# Patient Record
Sex: Male | Born: 1935
Health system: Southern US, Community
[De-identification: ages and names within clinical notes are randomized; demographics above are authoritative.]

## PROBLEM LIST (undated history)

## (undated) DIAGNOSIS — C61 Malignant neoplasm of prostate: Secondary | ICD-10-CM

## (undated) DIAGNOSIS — J9610 Chronic respiratory failure, unspecified whether with hypoxia or hypercapnia: Secondary | ICD-10-CM

## (undated) DIAGNOSIS — E039 Hypothyroidism, unspecified: Secondary | ICD-10-CM

## (undated) DIAGNOSIS — J189 Pneumonia, unspecified organism: Secondary | ICD-10-CM

## (undated) HISTORY — PX: TONSILLECTOMY: SUR1361

## (undated) HISTORY — DX: Malignant neoplasm of prostate: C61

## (undated) HISTORY — PX: ROTATOR CUFF REPAIR: SHX139

## (undated) HISTORY — PX: ADENOIDECTOMY: SUR15

## (undated) HISTORY — DX: Hypothyroidism, unspecified: E03.9

---

## 1998-09-17 HISTORY — PX: OTHER SURGICAL HISTORY: SHX169

## 1999-09-18 HISTORY — PX: TRANSURETHRAL RESECTION OF PROSTATE: SHX73

## 2010-11-06 ENCOUNTER — Telehealth: Payer: Self-pay | Admitting: Pulmonary Disease

## 2010-11-07 ENCOUNTER — Institutional Professional Consult (permissible substitution) (INDEPENDENT_AMBULATORY_CARE_PROVIDER_SITE_OTHER): Payer: MEDICARE | Admitting: Pulmonary Disease

## 2010-11-07 ENCOUNTER — Encounter: Payer: Self-pay | Admitting: Pulmonary Disease

## 2010-11-07 DIAGNOSIS — J439 Emphysema, unspecified: Secondary | ICD-10-CM | POA: Insufficient documentation

## 2010-11-07 DIAGNOSIS — J438 Other emphysema: Secondary | ICD-10-CM

## 2010-11-07 DIAGNOSIS — J449 Chronic obstructive pulmonary disease, unspecified: Secondary | ICD-10-CM | POA: Insufficient documentation

## 2010-11-07 DIAGNOSIS — Z8546 Personal history of malignant neoplasm of prostate: Secondary | ICD-10-CM | POA: Insufficient documentation

## 2010-11-07 DIAGNOSIS — J309 Allergic rhinitis, unspecified: Secondary | ICD-10-CM | POA: Insufficient documentation

## 2010-11-14 NOTE — Progress Notes (Signed)
Summary: has consult tomorrow &  needs to know if he should resch due to   Phone Note Call from Patient Call back at Home Phone [phone]   Caller: Patient Call For: [facility] Summary of Call: Patient phoned stated that he has a consult with Dr Shelle Iron tomorrow and he is on an antibiotic he still has a cough, and chest congestion, states that he feels good but he doesnt think that he could do any type of breathing test tomorrow and wants to know if he should keep that appt or reschedule. Patient can be reached at [phone] or cell [phone] Initial call taken by: Vedia Coffer,  November 06, 2010 9:50 AM  Follow-up for Phone Call        Spoke with pt and advised to keep appt to get established with Dr. Shelle Iron and if he wants a breathing tst he can order for a future date. Pt states understanding. Carron Curie CMA  November 06, 2010 11:56 AM

## 2010-11-23 NOTE — Assessment & Plan Note (Addendum)
Summary: consult for management of copd   Copy to:  Virgilio Frees Primary Provider/Referring Provider:  Virgilio Frees  CC:  Pulmonary Consult.  History of Present Illness: the pt is a 77y/o male who I have been asked to see for management of emphysema.  He was diagnosed many years ago, and has been managed by a pulmonologist ([employer]) in [place].  He is currently on advair with as needed combivent, and did not see a significant difference with a trial of spiriva.  He does not wear oxygen currently, and has never participated in a pulmonary rehab program.  He had an acute exacerbation 2 weeks ago which required a course of abx/pred, and is finishing up his treatment today.  This is his first exacerbation in 45yrs.  He still has some congestion, but is returning to his usual baseline.  He typically has no sob at rest, and exercises on treadmill one mile each day.  He can bring groceries in from the car without sob, but will get somewhat winded climbing one flight of stairs.  His last xray and pfts were one year ago, and will try to get from Dr. Excell Seltzer.   Preventive Screening-Counseling & Management  Alcohol-Tobacco     Smoking Status: quit  Current Medications (verified): 1)  Advair Diskus 250-50 Mcg/dose  Misc (Fluticasone-Salmeterol) .... One Puff Twice Daily 2)  Combivent 18-103 Mcg/act Aero (Ipratropium-Albuterol) .... Inhale 2 Puffs Every 4 To 6 Hours As Needed 3)  Doxazosin Mesylate 8 Mg Tabs (Doxazosin Mesylate) .... Take 1 Tablet By Mouth Once A Day 4)  Levothroid 50 Mcg Tabs (Levothyroxine Sodium) .... Take 1 Tablet By Mouth Once A Day 5)  Zolpidem Tartrate 10 Mg Tabs (Zolpidem Tartrate) .... Take 1 Tab By Mouth At Bedtime 6)  Cefdinir 300 Mg Caps (Cefdinir) .... Take 1 Tablet By Mouth Two Times A Day 7)  Prednisone 20 Mg Tabs (Prednisone) .... Take 1 Tablet By Mouth Two Times A Day 8)  Cvs Spectravite Senior  Tabs (Multiple Vitamins-Minerals) .... Take 1 Tablet By Mouth Once A Day 9)   Citracal +vit D 400/500 .... Take 1 Tablet By Mouth Once A Day 10)  Ra Potassium Gluconate 99 .... Take 1 Tablet By Mouth Once A Day 11)  Vitamin D3 1000 Unit Tabs (Cholecalciferol) .... Take 1 Tablet By Mouth Once A Day  Allergies (verified): 1)  ! Morphine  Past History:  Past Medical History: Emphysema ALLERGIC RHINITIS (ICD-477.9) PROSTATE CANCER (ICD-185) Hypothyroidism   Past Surgical History: Bil rotator cuff repair early 1990s and 2009 TURP x 2 2001 Seed Implant for Prostate cancer 2000 tonsillectomy and adenoidectomy as a child  Family History: Reviewed history and no changes required. heart disease: mother  cancer: father (prostate)   Social History: Reviewed history and no changes required. Patient states former smoker.  started at age 66.  1 1/2 ppd.  Quit 2010. pt is married and lives with wife. pt is a retired Art gallery manager.  Smoking Status:  quit  Review of Systems       The patient complains of shortness of breath with activity, productive cough, sore throat, and nasal congestion/difficulty breathing through nose.  The patient denies shortness of breath at rest, non-productive cough, coughing up blood, chest pain, irregular heartbeats, acid heartburn, indigestion, loss of appetite, weight change, abdominal pain, difficulty swallowing, tooth/dental problems, headaches, sneezing, itching, ear ache, anxiety, depression, hand/feet swelling, joint stiffness or pain, rash, change in color of mucus, and fever.    Vital Signs:  Patient profile:   77 year old male Height:      66 inches Weight:      135 pounds BMI:     21.87 O2 Sat:      91 % on Room air Temp:     98.5 degrees F oral Pulse rate:   119 / minute BP sitting:   140 / 68  (left arm) Cuff size:   regular  Vitals Entered By: Arman Filter LPN (November 07, 2010 2:05 PM)  O2 Flow:  Room air CC: Pulmonary Consult Comments Medications reviewed with patient Arman Filter LPN  November 07, 2010 2:16 PM      Physical Exam  General:  thin male in nad  Eyes:  PERRLA and EOMI.   Nose:  narrowed bilat, no purulence Mouth:  no lesions or exudates seen Neck:  no jvd,tmg, LN Lungs:  very decreased bs throughout, no wheezing or rhonchi  Heart:  rrr, no mrg distant hs Abdomen:  soft and nontender, bs+ Extremities:  no edema or cyanosis  pulses intact distally Neurologic:  alert and oriented, moves all 4    Impression & Recommendations:  Problem # 1:  EMPHYSEMA (ICD-492.8) the pt has a h/o emphysema, and is just getting over an acute exacerbation.  He has no bronchospasm on exam currently, but does desat with exertion today.  He does not want to wear oxygen with exertion, but I do think he needs ONO to r/o nocturnal desats that may be more prolonged and hence be more of an issue for him.  He should maintain on LABA/ICS, but I asked him to consider changing advair to symbicort given the more rapid onset of the LABA.  I also discussed the importance of conditioning, and that he may benefit from participating in pulmonary rehab at [facility].  He will let me know if he decides to do this.    Medications Added to Medication List This Visit: 1)  Advair Diskus 250-50 Mcg/dose Misc (Fluticasone-salmeterol) .... One puff twice daily 2)  Combivent 18-103 Mcg/act Aero (Ipratropium-albuterol) .... Inhale 2 puffs every 4 to 6 hours as needed 3)  Doxazosin Mesylate 8 Mg Tabs (Doxazosin mesylate) .... Take 1 tablet by mouth once a day 4)  Levothroid 50 Mcg Tabs (Levothyroxine sodium) .... Take 1 tablet by mouth once a day 5)  Zolpidem Tartrate 10 Mg Tabs (Zolpidem tartrate) .... Take 1 tab by mouth at bedtime 6)  Cefdinir 300 Mg Caps (Cefdinir) .... Take 1 tablet by mouth two times a day 7)  Prednisone 20 Mg Tabs (Prednisone) .... Take 1 tablet by mouth two times a day 8)  Cvs Spectravite Senior Tabs (Multiple vitamins-minerals) .... Take 1 tablet by mouth once a day 9)  Citracal +vit D 400/500  .... Take 1  tablet by mouth once a day 10)  Ra Potassium Gluconate 99  .... Take 1 tablet by mouth once a day 11)  Vitamin D3 1000 Unit Tabs (Cholecalciferol) .... Take 1 tablet by mouth once a day  Other Orders: Consultation Level IV (99244) Pulse Oximetry, Ambulatory (19147) DME Referral (DME)  Patient Instructions: 1)  stay on advair for now, but can consider a trial of symbicort if you wish.  Let me know. 2)  will get records from Dr. Bernette Redbird office (xrays and pfts) 3)  consider pulmonary rehab in [place] 4)  will check oxygen level overnight in 2-3 weeks.Marland Kitchenonce you get over this recent flareup. 5)  followup with me in 6mos, or sooner if  having problems.   .Ambulatory Pulse Oximetry  Resting; HR__103___    02 Sat__90___  Lap1 (185 feet)   HR___107   __   02 Sat_91____ Lap2 (185 feet)   HR___114__   02 Sat___89__    Lap3 (185 feet)   HR__120___   02 Sat__87___  __x_Test Completed without Difficulty  pt recovered on room air 91% RA   HR 107 pt did not want 02 .Kandice Hams [facility]  November 07, 2010 3:10 PM    Appended Document: consult for management of copd megan, have we received the pulmonary records on this pt from dr. Bernette Redbird office?  Appended Document: consult for management of copd still haven't received records...therefore refaxed ROI.   Appended Document: consult for management of copd received records and put in [facility] very important look at folder.

## 2010-12-16 ENCOUNTER — Encounter: Payer: Self-pay | Admitting: Pulmonary Disease

## 2010-12-16 NOTE — Progress Notes (Deleted)
  Subjective:    Patient ID: John Black, male    DOB: Nov 25, 1928, 77 y.o.   [name]: [id number]  HPI    Review of Systems     Objective:   Physical Exam        Assessment & Plan:

## 2010-12-16 NOTE — Progress Notes (Signed)
ONO on room air shows low sat of 86%, and only 4.80min less than 88%.  Does not need oxygen.

## 2010-12-19 ENCOUNTER — Telehealth: Payer: Self-pay | Admitting: Pulmonary Disease

## 2010-12-19 NOTE — Telephone Encounter (Signed)
Called, spoke with pt.  He has several questions for Dr. Shelle Iron, 1.  Did KC receive records from [facility]? 2.  Pt will be ok with considering a trail of symbicort if he has not done this with [facility] 3.  Requesting ONO results - Done March 19 4.  Would consider Pulm Rehab depending of above. Aware KC out of office until Thursday and ok with this.  Dr. Shelle Iron, pls advise. Thanks!

## 2010-12-20 NOTE — Telephone Encounter (Signed)
ATC pt to inform him of ONO results (per KC, pt does not need oxygen at night) NA.  WCB

## 2010-12-21 NOTE — Telephone Encounter (Signed)
I would recommend attending pulmonary rehab at [facility] Did get records from [place] and reviewed.  Did not see specifically where he had tried symbicort, so would give this a shot to see if he likes better than advair.

## 2010-12-25 ENCOUNTER — Telehealth: Payer: Self-pay | Admitting: Pulmonary Disease

## 2010-12-25 NOTE — Telephone Encounter (Signed)
ERROR PATIENT WAS ACTUALLY RETURNING A CALL TO TRIAGE.John Black

## 2010-12-25 NOTE — Telephone Encounter (Signed)
Called, spoke with pt.  He was informed [facility] recs attending pulmonary rehab at [facility], aware records were received from [facility] and review.  Advised KC did not see specifically where he had tried symbicort so he recs to try this is see if he likes it better than advair.  Pt verbalized understanding of this instructions but requesting to hold off on rehab for now and try symbicort first.  Also, requesting ONO results.  KC, pls advise on ONO results and on symbicort strength and dosing.  [facility].  Thanks!

## 2010-12-25 NOTE — Telephone Encounter (Signed)
ONO was ok.  See message string below this one. symbicort should be 160/4.5  2 am and pm in the place of advair.

## 2010-12-25 NOTE — Telephone Encounter (Signed)
Patient phoned stated that he was returning a call from last week, he can be reached at [phone]

## 2010-12-26 NOTE — Telephone Encounter (Signed)
Advised pt of ono results and pt verbalized understanding. Pt states that he does want to trial the symbicort but wants a sample to try 1st. I advised pt we did not have any samples today. Pt states he will call back tomorrow to see if we have any tomorrow. Will also forward to [employer] to make him aware pt wants to try the symbicort. Please advise. Thanks  Carver Fila, CMA

## 2010-12-26 NOTE — Telephone Encounter (Signed)
Patient returning call.

## 2010-12-28 ENCOUNTER — Telehealth: Payer: Self-pay | Admitting: Pulmonary Disease

## 2010-12-28 NOTE — Telephone Encounter (Signed)
Spoke w/ pt and advised 1 sample of symbicort was left upfront for pick up

## 2010-12-29 NOTE — Telephone Encounter (Signed)
Mindy, you do not have to send these types of messages back to me, just for me to send back to you.  Thanks.

## 2010-12-29 NOTE — Telephone Encounter (Signed)
Patient phoned stated that he picked up a sample of Symbicort yesterday he wants to know if he can just stop the Advir and start the Symbicort or if he needs to wein himself off of the Advir he can be reached at [phone].Vedia Coffer

## 2010-12-29 NOTE — Telephone Encounter (Signed)
Advised pt per [facility] former not he was to do symbicort in place of the advair. Pt verbalized understanding and states he will give it a try. Pt states he will start symbicort Monday morning  Nothing further was needed. Will forward to Dr. Shelle Iron so he is a ware. Please advise. Thanks  John Black, CMA

## 2011-01-17 ENCOUNTER — Telehealth: Payer: Self-pay | Admitting: Pulmonary Disease

## 2011-01-17 NOTE — Telephone Encounter (Signed)
Spoke w/ pt and he states he finished up his trial of symbicort yesterday. Pt states he could not tell a difference in using the symbicort vs advair. Pt states he went back to taking advair this morning and has 3 days left of that. Pt wants to know what he needs to do. Does he continue the symbicort or stay on advair? Please advise Dr. Shelle Iron. Thanks.   Carver Fila, CMA

## 2011-01-17 NOTE — Telephone Encounter (Signed)
advair and symbicort are both good meds.  He needs to stay on one or the other.  If he has a preference, ok to stay on that one.  Either is ok with me.

## 2011-01-18 MED ORDER — BUDESONIDE-FORMOTEROL FUMARATE 160-4.5 MCG/ACT IN AERO: 2.0000 | INHALATION_SPRAY | Freq: Two times a day (BID) | RESPIRATORY_TRACT | Status: DC

## 2011-01-18 NOTE — Telephone Encounter (Signed)
Spoke w/ pt and he states he is going to continue on the symbicort. He states he needed rx sent to [facility]. I advised pt I would send rx and he verbalized understanding and needed nothing else further

## 2011-01-22 ENCOUNTER — Telehealth: Payer: Self-pay | Admitting: Pulmonary Disease

## 2011-01-22 MED ORDER — IPRATROPIUM-ALBUTEROL 18-103 MCG/ACT IN AERO: INHALATION_SPRAY | RESPIRATORY_TRACT | Status: DC

## 2011-01-22 NOTE — Telephone Encounter (Signed)
Pt states he needed his combivent sent to pharmacy. Pt states he is completely out. Advised pt rx was sent to pharmacy and nothing further was needed

## 2011-05-16 ENCOUNTER — Ambulatory Visit: Payer: MEDICARE | Admitting: Pulmonary Disease

## 2011-05-22 ENCOUNTER — Encounter: Payer: Self-pay | Admitting: Pulmonary Disease

## 2011-05-23 ENCOUNTER — Encounter: Payer: Self-pay | Admitting: Pulmonary Disease

## 2011-05-23 ENCOUNTER — Ambulatory Visit (INDEPENDENT_AMBULATORY_CARE_PROVIDER_SITE_OTHER): Payer: Medicare Other | Admitting: Pulmonary Disease

## 2011-05-23 VITALS — BP 126/60 | HR 92 | Temp 98.1°F | Ht 66.0 in | Wt 131.8 lb

## 2011-05-23 DIAGNOSIS — J438 Other emphysema: Secondary | ICD-10-CM

## 2011-05-23 MED ORDER — IPRATROPIUM-ALBUTEROL 18-103 MCG/ACT IN AERO: INHALATION_SPRAY | RESPIRATORY_TRACT | Status: DC

## 2011-05-23 MED ORDER — BUDESONIDE-FORMOTEROL FUMARATE 160-4.5 MCG/ACT IN AERO: 2.0000 | INHALATION_SPRAY | Freq: Two times a day (BID) | RESPIRATORY_TRACT | Status: DC

## 2011-05-23 NOTE — Assessment & Plan Note (Signed)
The patient overall has been doing well from his emphysema standpoint on his current regimen.  He did develop an episode of acute bronchitis after being around his grandchildren, but responded to a course of antibiotics and prednisone.  He feels that he is just about returned to his normal baseline, but still has some increased mucus production that is not purulent.  I have asked him to try Mucinex to see if this will help.  Otherwise, he is to continue on his current regimen and exercise program.  I have also asked him to get a flu shot this fall once he is over his current illness.

## 2011-05-23 NOTE — Patient Instructions (Signed)
No change in meds Can try mucinex extra strength (plain, not dm) one in am and pm with large glass of water for a week or two to help with mucus clearance. followup with me in 6mos if doing well.

## 2011-05-23 NOTE — Progress Notes (Signed)
  Subjective:    Patient ID: John Black, male    DOB: 10/23/1928, 77 y.o.   [name]: [id number]  HPI The patient comes in today for followup of his known emphysema.  He has done well on his current bronchodilator regimen, but recently had an episode of acute bronchitis.  He was treated with a course of antibiotics and prednisone which he just finished.  He is much improved, and feels he is near his usual baseline.  He still has some non-purulent mucus production.  He is staying very active at this time, and participates in regular exercise.   Review of Systems  Constitutional: Negative for fever and unexpected weight change.  HENT: Positive for congestion, rhinorrhea, postnasal drip and sinus pressure. Negative for ear pain, nosebleeds, sore throat, sneezing, trouble swallowing and dental problem.   Eyes: Negative for redness and itching.  Respiratory: Positive for cough, shortness of breath and wheezing. Negative for chest tightness.   Cardiovascular: Negative for palpitations and leg swelling.  Gastrointestinal: Negative for nausea and vomiting.  Genitourinary: Positive for dysuria.  Musculoskeletal: Negative for joint swelling.  Skin: Negative for rash.  Neurological: Negative for headaches.  Hematological: Bruises/bleeds easily.  Psychiatric/Behavioral: Negative for dysphoric mood. The patient is not nervous/anxious.        Objective:   Physical Exam Thin male in no acute distress Nose without purulence or discharge noted Chest with decreased breath sounds throughout, but no wheezing or rhonchi Cardiac exam with regular rate and rhythm Lower extremities without edema, no cyanosis noted Alert and oriented, moves all 4 extremities.       Assessment & Plan:

## 2011-11-20 ENCOUNTER — Ambulatory Visit (INDEPENDENT_AMBULATORY_CARE_PROVIDER_SITE_OTHER): Payer: Medicare Other | Admitting: Pulmonary Disease

## 2011-11-20 ENCOUNTER — Encounter: Payer: Self-pay | Admitting: Pulmonary Disease

## 2011-11-20 VITALS — BP 132/78 | HR 112 | Temp 98.1°F | Ht 66.0 in | Wt 135.0 lb

## 2011-11-20 DIAGNOSIS — J438 Other emphysema: Secondary | ICD-10-CM

## 2011-11-20 MED ORDER — BUDESONIDE-FORMOTEROL FUMARATE 160-4.5 MCG/ACT IN AERO: 2.0000 | INHALATION_SPRAY | Freq: Two times a day (BID) | RESPIRATORY_TRACT | Status: DC

## 2011-11-20 NOTE — Assessment & Plan Note (Signed)
The pt has been doing ok since the last visit, but has noted a little more doe.  He has tended to be a minimalist wrt medical intervention, but I think we should give him a trial of spiriva to see if it helps.  I have also recommended oxygen with heavier exertional activities, but the pt would like to think about this.  If he is doing well, will see him back in 6mos.

## 2011-11-20 NOTE — Progress Notes (Signed)
  Subjective:    Patient ID: John Black, male    DOB: 1929/03/30, 78 y.o.   [name]: [id number]  HPI The patient comes in today for follow up of his known severe COPD.  He was last seen in September of last year, and he tells me in December of this last year he had what sounds like an episode of acute bronchitis with an acute exacerbation.  He feels like he returned to his usual baseline, however believes that his exertional tolerance is not as good as last year.  He does have known desaturation with activity, but has wanted to stay away from oxygen use.  He currently has no chest congestion or purulent mucus.   Review of Systems  Constitutional: Negative for fever and unexpected weight change.  HENT: Negative for ear pain, nosebleeds, congestion, sore throat, rhinorrhea, sneezing, trouble swallowing, dental problem, postnasal drip and sinus pressure.   Eyes: Negative for redness and itching.  Respiratory: Positive for chest tightness, shortness of breath and wheezing. Negative for cough.   Cardiovascular: Negative for palpitations and leg swelling.  Gastrointestinal: Negative for nausea and vomiting.  Genitourinary: Negative for dysuria.  Musculoskeletal: Negative for joint swelling.  Skin: Negative for rash.  Neurological: Negative for headaches.  Hematological: Does not bruise/bleed easily.  Psychiatric/Behavioral: Negative for dysphoric mood. The patient is not nervous/anxious.        Objective:   Physical Exam Thin male in no acute distress Nose without purulence or discharge noted Chest with decreased breath sounds throughout, no wheezing Cardiac exam with regular rate and rhythm Lower extremities with no edema, no cyanosis Alert and oriented, moves all 4 extremities.       Assessment & Plan:

## 2011-11-20 NOTE — Patient Instructions (Signed)
Continue symbicort as you are doing Start spiriva one inhalation each am as a trial for the next 4 weeks.  Let me know what you think Hold combivent, and replace with albuterol 2 puffs every 6hrs if needed for rescue only. Try and stay as active as possible Consider using oxygen with consistent exertional activities.  Let me know followup with me in 6mos

## 2011-12-21 ENCOUNTER — Telehealth: Payer: Self-pay | Admitting: Pulmonary Disease

## 2011-12-21 ENCOUNTER — Encounter: Payer: Self-pay | Admitting: Pulmonary Disease

## 2011-12-21 NOTE — Telephone Encounter (Signed)
If the spiriva didn't improve things, then stop and stay on symbicort. Can also change rescue from albuterol to combivent prn.

## 2011-12-21 NOTE — Telephone Encounter (Signed)
I spoke with pt and he was calling to give update on spiriva. He states he can;t tell any difference in his breathing and he has 1 capsule left to take and he has been on it for 1 month. He also states the combivent inhaler helped more with his breathing than the proair did. Please advise KC thanks

## 2011-12-21 NOTE — Telephone Encounter (Signed)
I spoke with pt and is aware of KC recs. Pt states he will call back if he needs refills. Nothing further was needed

## 2012-02-18 ENCOUNTER — Telehealth: Payer: Self-pay | Admitting: Pulmonary Disease

## 2012-02-18 ENCOUNTER — Other Ambulatory Visit: Payer: Self-pay | Admitting: Pulmonary Disease

## 2012-02-18 NOTE — Telephone Encounter (Signed)
Last seen by [facility] 11/2011, follow up in 6 months.  Per last ov note, pt was sent refills on his symbicort to the requested pharmacy.  [facility] > had been filling pt's old rx for the symbicort but will now fill off of the script sent in March.    Called spoke with patient, advised him of the following.  Pt okay with this and verbalized his understanding.  Encouraged pt to call if anything further is needed.  Will sign off.

## 2012-05-29 ENCOUNTER — Ambulatory Visit: Payer: Medicare Other | Admitting: Pulmonary Disease

## 2012-05-29 ENCOUNTER — Encounter: Payer: Self-pay | Admitting: Pulmonary Disease

## 2012-05-29 ENCOUNTER — Ambulatory Visit (INDEPENDENT_AMBULATORY_CARE_PROVIDER_SITE_OTHER): Payer: Medicare Other | Admitting: Pulmonary Disease

## 2012-05-29 VITALS — BP 118/70 | HR 99 | Temp 98.2°F | Ht 66.0 in | Wt 136.2 lb

## 2012-05-29 DIAGNOSIS — J438 Other emphysema: Secondary | ICD-10-CM

## 2012-05-29 MED ORDER — IPRATROPIUM-ALBUTEROL 18-103 MCG/ACT IN AERO: INHALATION_SPRAY | RESPIRATORY_TRACT | Status: DC

## 2012-05-29 MED ORDER — PREDNISONE 10 MG PO TABS: ORAL_TABLET | ORAL | Status: DC

## 2012-05-29 MED ORDER — BUDESONIDE-FORMOTEROL FUMARATE 160-4.5 MCG/ACT IN AERO: 2.0000 | INHALATION_SPRAY | Freq: Two times a day (BID) | RESPIRATORY_TRACT | Status: DC
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
## 2012-05-29 NOTE — Progress Notes (Signed)
  Subjective:    Patient ID: John Black, male    DOB: 30-Jul-1929, 79 y.o.   [name]: [id number]  HPI Patient comes in today for followup of his known COPD.  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  He has noticed a little bit more increased shortness of breath, but no wheezing or purulent mucus.  He did not see a difference with Spiriva in the past, and therefore we discontinued this.  He has been having exertional desaturations for some time, but has not been willing to wear oxygen.  He is now willing to consider this.   Review of Systems  Constitutional: Negative for fever and unexpected weight change.  HENT: Positive for congestion, rhinorrhea, sneezing and postnasal drip. Negative for ear pain, nosebleeds, sore throat, trouble swallowing, dental problem and sinus pressure.   Eyes: Positive for itching. Negative for redness.  Respiratory: Positive for shortness of breath and wheezing. Negative for cough and chest tightness.   Cardiovascular: Negative for palpitations and leg swelling.  Gastrointestinal: Negative for nausea and vomiting.  Genitourinary: Negative for dysuria.  Musculoskeletal: Negative for joint swelling.  Skin: Negative for rash.  Neurological: Negative for headaches.  Hematological: Does not bruise/bleed easily.  Psychiatric/Behavioral: Negative for dysphoric mood. The patient is not nervous/anxious.        Objective:   Physical Exam Thin male in no acute distress Nose without purulence or discharge noted Chest with decreased breath sounds throughout, no active wheezing Cardiac exam with regular rate and rhythm Lower extremities without edema, no cyanosis Alert and oriented, moves all 4 extremities.       Assessment & Plan:
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
## 2012-06-19 ENCOUNTER — Encounter: Payer: Self-pay | Admitting: Family Medicine

## 2012-06-19 ENCOUNTER — Ambulatory Visit (INDEPENDENT_AMBULATORY_CARE_PROVIDER_SITE_OTHER): Payer: Medicare Other | Admitting: Family Medicine

## 2012-06-19 VITALS — BP 130/70 | HR 100 | Resp 20 | Ht 65.5 in | Wt 136.1 lb

## 2012-06-19 DIAGNOSIS — D229 Melanocytic nevi, unspecified: Secondary | ICD-10-CM | POA: Insufficient documentation

## 2012-06-19 DIAGNOSIS — J438 Other emphysema: Secondary | ICD-10-CM

## 2012-06-19 DIAGNOSIS — R42 Dizziness and giddiness: Secondary | ICD-10-CM

## 2012-06-19 DIAGNOSIS — C61 Malignant neoplasm of prostate: Secondary | ICD-10-CM

## 2012-06-19 DIAGNOSIS — E039 Hypothyroidism, unspecified: Secondary | ICD-10-CM

## 2012-06-19 DIAGNOSIS — L821 Other seborrheic keratosis: Secondary | ICD-10-CM

## 2012-06-19 DIAGNOSIS — D239 Other benign neoplasm of skin, unspecified: Secondary | ICD-10-CM

## 2012-06-19 MED ORDER — ZOLPIDEM TARTRATE 10 MG PO TABS: 10.0000 mg | ORAL_TABLET | Freq: Every evening | ORAL | Status: DC | PRN

## 2012-06-19 MED ORDER — LEVOTHYROXINE SODIUM 50 MCG PO TABS: 50.0000 ug | ORAL_TABLET | Freq: Every day | ORAL | Status: DC

## 2012-06-19 MED ORDER — DOXAZOSIN MESYLATE 8 MG PO TABS: 8.0000 mg | ORAL_TABLET | Freq: Every day | ORAL | Status: DC

## 2012-06-19 MED ORDER — IPRATROPIUM-ALBUTEROL 18-103 MCG/ACT IN AERO: INHALATION_SPRAY | RESPIRATORY_TRACT | Status: DC

## 2012-06-19 NOTE — Progress Notes (Signed)
  Subjective:    Patient ID: John Black, male    DOB: March 24, 1929, 78 y.o.   [name]: [id number]  HPI Patient here to establish care. Previous PCP tried adult pediatrics. Pulmonary- Dr. Marcelyn Bruins Medications and history reviewed This a.m. patient expands a mild dizzy spell where he fell off balance after waking up. He typically has low blood pressure has never been treated for hypertension. He's never had a stroke or myocardial infarction. There was no change in his speech no weakness or other stroke symptoms. The spell resolved by itself. He has not had any previous episode Prostate cancer diagnosed in 2000 and he is status post radiation as well as implants eating. He is currently maintained on Cardura secondary to BPH   COPD-he is currently followed by pulmonary clinic with Dr. claims. He was on Advair in the past however Symbicort is cheaper for him so he is currently on this as well as Combivent. He tells me he was on Spiriva and albuterol separately however these did not work for him. He has 2 L of oxygen at home however rarely uses this. He has had a sleep study in the past. He is asking for prescription for Combivent so that he can try a mail order as this is costly for him.  Rash- He's noticed a spot on his scalp over the past few years which flakes off and then continues to grow. He gets caught in his tone with daily hair combing and washing of gout. He also has other spots ondate concerns him   Review of Systems  GEN- denies fatigue, fever, weight loss,weakness, recent illness HEENT- denies eye drainage, change in vision, nasal discharge, CVS- denies chest pain, palpitations RESP- denies SOB, cough, wheeze ABD- denies N/V, change in stools, abd pain GU- denies dysuria, hematuria, dribbling, incontinence MSK- denies joint pain, muscle aches, injury Neuro- denies headache, dizziness, syncope, seizure activity      Objective:   Physical Exam GEN- NAD, alert and oriented  x3 HEENT- PERRL, EOMI, non injected sclera, pink conjunctiva, MMM, oropharynx clear, upper dentures Neck- Supple, no appreciable bruit but difficult to hear CVS- RRR, no murmur RESP-CTAB, decreased at bases, no wheeze ABD-NABS,soft,NT,ND EXT- No edema Pulses- Radial 2+ Skin- fair skin, crusty lesion on top of scalp, no hair in the area, scaly pearly lesion on forehead, multiple keratosis on back, face, neck  Psych-normal affect and Mood  Neuro-CNII-XII in tact, no focal deficits       Assessment & Plan:

## 2012-06-19 NOTE — Assessment & Plan Note (Signed)
Review cardiology note. Given prescription for Combivent I did discuss with him that there is now Combivent breast feels that his inhaler made looked different from his previous prescription

## 2012-06-19 NOTE — Assessment & Plan Note (Addendum)
Acute episode of dizziness this morning. No CVA symptoms. His exam is within limits today he is not orthostatic. If he has recurrent symptoms will start workup he may need carotid Dopplers , plus minus MRI and referral to neurology, with his cancer history

## 2012-06-19 NOTE — Assessment & Plan Note (Signed)
Currently in remission. Continue Cardura for BPH symptoms

## 2012-06-19 NOTE — Assessment & Plan Note (Signed)
Concern for basal cell carcinoma on his forehead and his scalp will refer to dermatology

## 2012-06-19 NOTE — Assessment & Plan Note (Signed)
Obtain most recent labs continue Synthroid

## 2012-06-19 NOTE — Patient Instructions (Signed)
Continue current medications I will get records Call if you have another spell  Shop around for the combivent  Medications refilled  Dermatology referral F/U 4 month

## 2012-07-07 ENCOUNTER — Encounter: Payer: Self-pay | Admitting: Family Medicine

## 2012-07-07 ENCOUNTER — Ambulatory Visit (INDEPENDENT_AMBULATORY_CARE_PROVIDER_SITE_OTHER): Payer: Medicare Other | Admitting: Family Medicine

## 2012-07-07 VITALS — BP 130/68 | HR 116 | Resp 22 | Ht 65.5 in | Wt 137.0 lb

## 2012-07-07 DIAGNOSIS — J441 Chronic obstructive pulmonary disease with (acute) exacerbation: Secondary | ICD-10-CM

## 2012-07-07 MED ORDER — LEVOFLOXACIN 500 MG PO TABS: 500.0000 mg | ORAL_TABLET | Freq: Every day | ORAL | Status: AC

## 2012-07-07 MED ORDER — PREDNISONE 10 MG PO TABS: ORAL_TABLET | ORAL | Status: DC

## 2012-07-07 NOTE — Assessment & Plan Note (Signed)
IM solumedrol, antibiotics, prednisone taper, advised to use oxygen and rescue inhaler

## 2012-07-07 NOTE — Patient Instructions (Addendum)
Use oxygen while currently ill Use albuterol as rescue inhaler  Take antibiotics as prescribed Start prednisone tomorrow  F/U on Thursday if not improving

## 2012-07-07 NOTE — Progress Notes (Signed)
  Subjective:    Patient ID: John Black, male    DOB: May 03, 1929, 78 y.o.   [name]: [id number]  HPI Patient here with cough nasal congestion shortness of breath for the past 4 days. His wife was sick last week. He's been using his Symbicort however has not used albuterol very much which is his rescue inhaler. He is short of breath with very short distances and his wife has noticed increased wheezing at bedtime. He has severe COPD/emphysema. He does have oxygen at home but uses it very rarely. His wife gave him 10 mg of prednisone this morning and one doxycycline tablet   Review of Systems   GEN- denies fatigue, fever, weight loss,weakness, recent illness HEENT- denies eye drainage, change in vision, nasal discharge, CVS- denies chest pain, palpitations RESP- denies SOB, cough, wheeze ABD- denies N/V, change in stools, abd pain GU- denies dysuria, hematuria, dribbling, incontinence MSK- denies joint pain, muscle aches, injury Neuro- denies headache, dizziness, syncope, seizure activity      Objective:   Physical Exam  GEN- NAD, alert and oriented x3 HEENT- PERRL, EOMI, non injected sclera, pink conjunctiva, MMM, oropharynx clear,  Neck- Supple, no LAD CVS- RRR, no murmur RESP-+bronchospasm and wheeze, increased WOB with exertion  EXT- No edema Pulses- Radial 2+  S/p neb improved WOB, given solumedrol 125mg  injection     Assessment & Plan:

## 2012-07-08 MED ORDER — IPRATROPIUM BROMIDE 0.02 % IN SOLN
0.5000 mg | Freq: Once | RESPIRATORY_TRACT | Status: AC
  Administered 2012-07-08: 0.5 mg via RESPIRATORY_TRACT

## 2012-07-08 MED ORDER — SODIUM CHLORIDE 0.9 % IV SOLN
125.0000 mg | Freq: Once | INTRAVENOUS | Status: AC
  Administered 2012-07-08: 130 mg via INTRAMUSCULAR

## 2012-07-08 MED ORDER — ALBUTEROL SULFATE (5 MG/ML) 0.5% IN NEBU
2.5000 mg | INHALATION_SOLUTION | Freq: Once | RESPIRATORY_TRACT | Status: AC
  Administered 2012-07-08: 2.5 mg via RESPIRATORY_TRACT

## 2012-07-08 NOTE — Addendum Note (Signed)
Addended by: Kandis Fantasia B on: 07/08/2012 08:31 AM   Modules accepted: Orders

## 2012-08-08 ENCOUNTER — Other Ambulatory Visit: Payer: Self-pay | Admitting: Family Medicine

## 2012-09-04 ENCOUNTER — Ambulatory Visit (INDEPENDENT_AMBULATORY_CARE_PROVIDER_SITE_OTHER): Payer: Medicare Other | Admitting: Family Medicine

## 2012-09-04 ENCOUNTER — Encounter: Payer: Self-pay | Admitting: Family Medicine

## 2012-09-04 VITALS — BP 132/66 | HR 74 | Temp 99.0°F | Resp 22 | Ht 65.5 in | Wt 135.1 lb

## 2012-09-04 DIAGNOSIS — J438 Other emphysema: Secondary | ICD-10-CM

## 2012-09-04 DIAGNOSIS — J189 Pneumonia, unspecified organism: Secondary | ICD-10-CM

## 2012-09-04 MED ORDER — PREDNISONE 20 MG PO TABS: 20.0000 mg | ORAL_TABLET | Freq: Two times a day (BID) | ORAL | Status: DC

## 2012-09-04 MED ORDER — LEVOFLOXACIN 750 MG PO TABS: 750.0000 mg | ORAL_TABLET | Freq: Every day | ORAL | Status: AC

## 2012-09-04 NOTE — Patient Instructions (Addendum)
I am treating for pneumonia - Start levaquin antibiotic once a day for a week Start the prednisone if you get worse or starting wheezing  Mucinex twice a day  Oxygen at all times for now  Go to ER if you get worse

## 2012-09-05 DIAGNOSIS — J189 Pneumonia, unspecified organism: Secondary | ICD-10-CM | POA: Insufficient documentation

## 2012-09-05 NOTE — Progress Notes (Signed)
  Subjective:    Patient ID: Elise Gladden, male    DOB: Mar 22, 1929, 78 y.o.   [name]: [id number]  HPI  Patient presents with progressive shortness of breath cough and fever x 3 days.  He noticed that he was also running fever and feeling a little achy. He did have a flu shot. He has not been using his oxygen. He uses his Combivent a few times a day. He's not been wheezing. +productive cough Fever 100.4 F   Review of Systems  GEN- + fatigue, fever, weight loss,weakness, recent illness HEENT- denies eye drainage, change in vision, nasal discharge, CVS- denies chest pain, palpitations RESP- + SOB, +cough, wheeze ABD- denies N/V, change in stools, abd pain GU- denies dysuria, hematuria, dribbling, incontinence MSK- denies joint pain, +muscle aches, injury Neuro- denies headache, dizziness, syncope, seizure activity      Objective:   Physical Exam GEN- NAD, alert and oriented x3 , given oxygen in Exam room improved to 90% HEENT- PERRL, EOMI, non injected sclera, pink conjunctiva, MMM, oropharynx mild injection, TM clear bilat no effusion, no maxillary sinus tenderness, nares clear  Neck- Supple, no LAD, no retractions  CVS- RRR, no murmur RESP-+ rales, Right base, no wheeze no bronchospasm, oxygen 84% RA , increased WOB with exertion only  EXT- No edema Pulses- Radial 2+          Assessment & Plan:

## 2012-09-05 NOTE — Assessment & Plan Note (Signed)
I'm concerned about acquired pneumonia. Will start him on Levaquin for one week. He'll also start Mucinex. With his fever and hypoxia and at this time no evidence of bronchospasm or wheeze. Since his oxygen levels are in the mid 80s without oxygen on a when I have him to get a chest x-ray today he lives less than 5 miles from here will go directly home to get his home oxygen instead.  Wife is to call for any changes, he feels comfortable going home

## 2012-09-05 NOTE — Assessment & Plan Note (Signed)
He does not appear to be in bronchospsam with acute exacerbation, therefore will hold on starting the steroids, if he gets worse of wheeze sets in wife aware to start prednisone, He is to use oxygen at all times during the time as hypoxic

## 2012-09-18 ENCOUNTER — Other Ambulatory Visit: Payer: Self-pay | Admitting: *Deleted

## 2012-09-18 MED ORDER — BUDESONIDE-FORMOTEROL FUMARATE 160-4.5 MCG/ACT IN AERO: 2.0000 | INHALATION_SPRAY | Freq: Two times a day (BID) | RESPIRATORY_TRACT | Status: DC

## 2012-09-25 ENCOUNTER — Other Ambulatory Visit: Payer: Self-pay | Admitting: Pulmonary Disease

## 2012-09-25 MED ORDER — BUDESONIDE-FORMOTEROL FUMARATE 160-4.5 MCG/ACT IN AERO: 2.0000 | INHALATION_SPRAY | Freq: Two times a day (BID) | RESPIRATORY_TRACT | Status: DC

## 2012-10-02 ENCOUNTER — Ambulatory Visit (HOSPITAL_COMMUNITY)
Admission: RE | Admit: 2012-10-02 | Discharge: 2012-10-02 | Disposition: A | Discharge status: Home / Self Care | Payer: Medicare Other | Source: Ambulatory Visit | Attending: Family Medicine | Admitting: Family Medicine

## 2012-10-02 ENCOUNTER — Telehealth: Payer: Self-pay | Admitting: Family Medicine

## 2012-10-02 DIAGNOSIS — R0602 Shortness of breath: Secondary | ICD-10-CM | POA: Insufficient documentation

## 2012-10-02 DIAGNOSIS — R05 Cough: Secondary | ICD-10-CM

## 2012-10-02 DIAGNOSIS — R059 Cough, unspecified: Secondary | ICD-10-CM

## 2012-10-02 DIAGNOSIS — J449 Chronic obstructive pulmonary disease, unspecified: Secondary | ICD-10-CM | POA: Insufficient documentation

## 2012-10-02 DIAGNOSIS — J4489 Other specified chronic obstructive pulmonary disease: Secondary | ICD-10-CM | POA: Insufficient documentation

## 2012-10-02 IMAGING — CR DG CHEST 2V
2 series · 2 of 2 positions shown · non-contrast
Comparison: None

CLINICAL DATA: Shortness of breath for 3 days, COPD

CHEST - 2 VIEW

[view not recorded (1 of 2)]
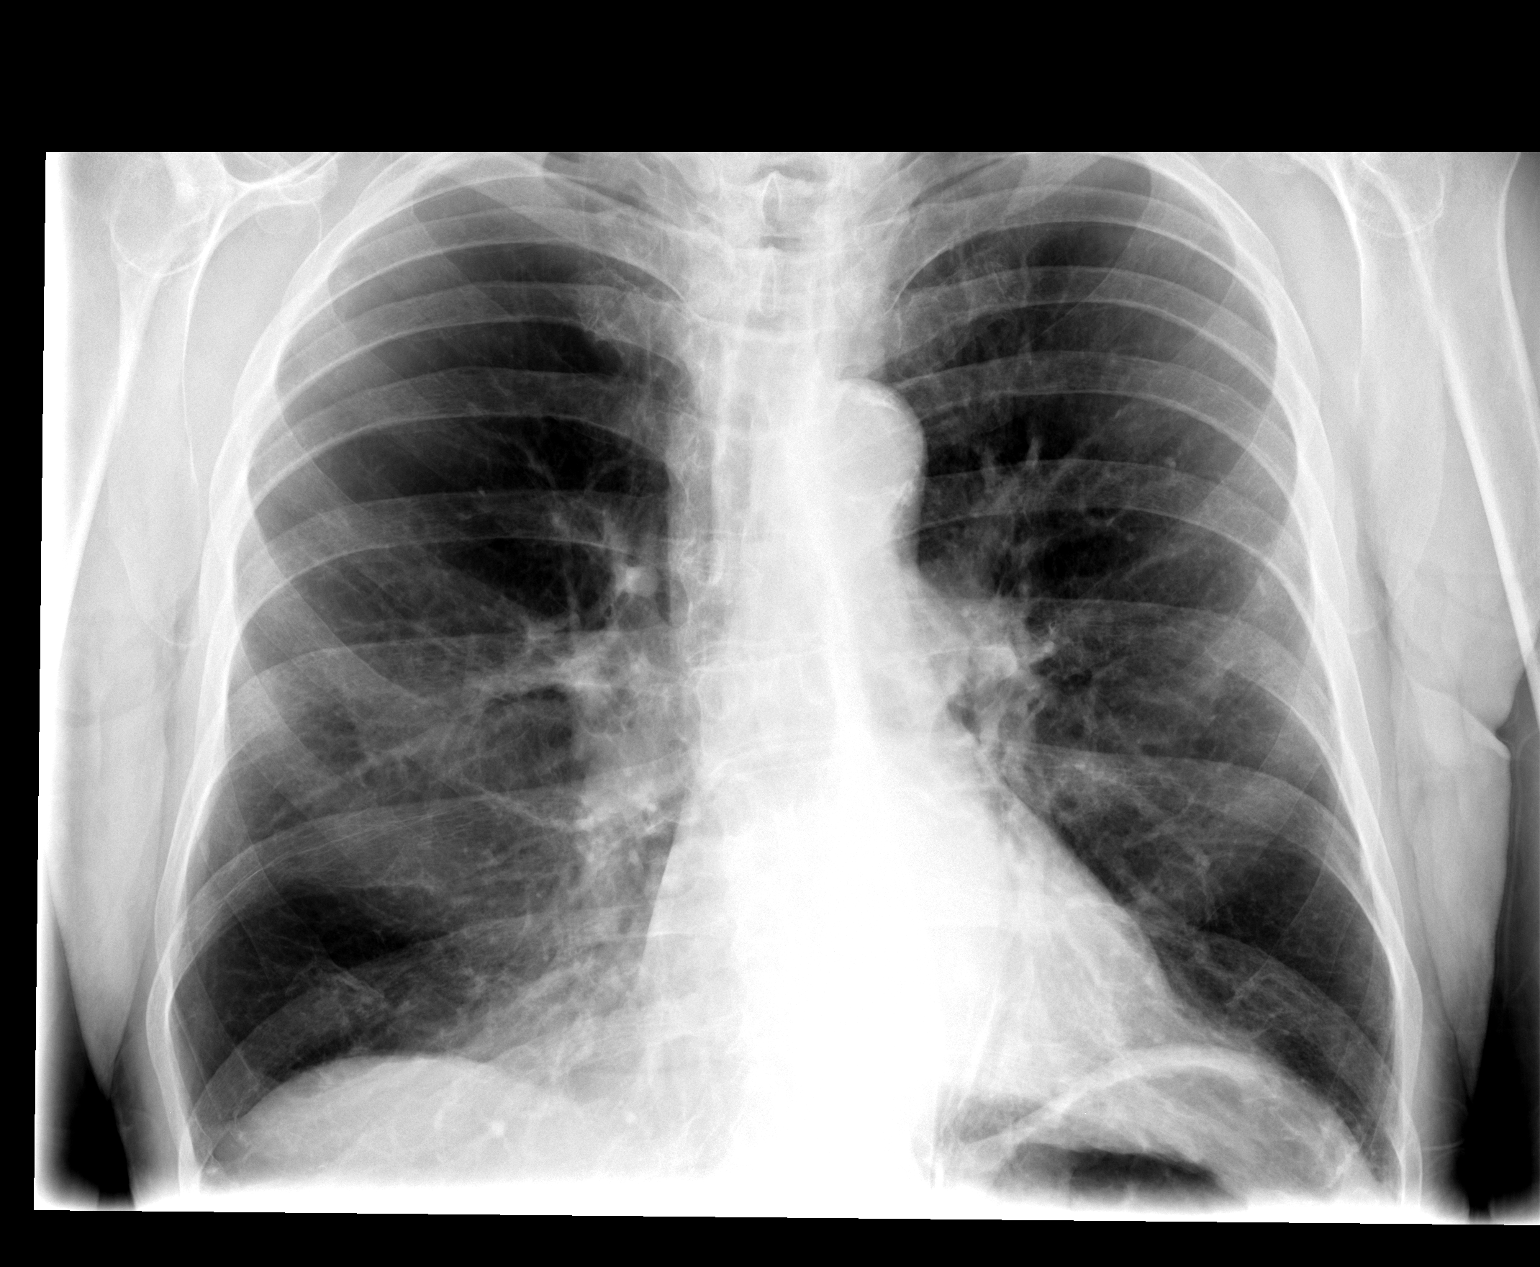

[view not recorded (2 of 2)]
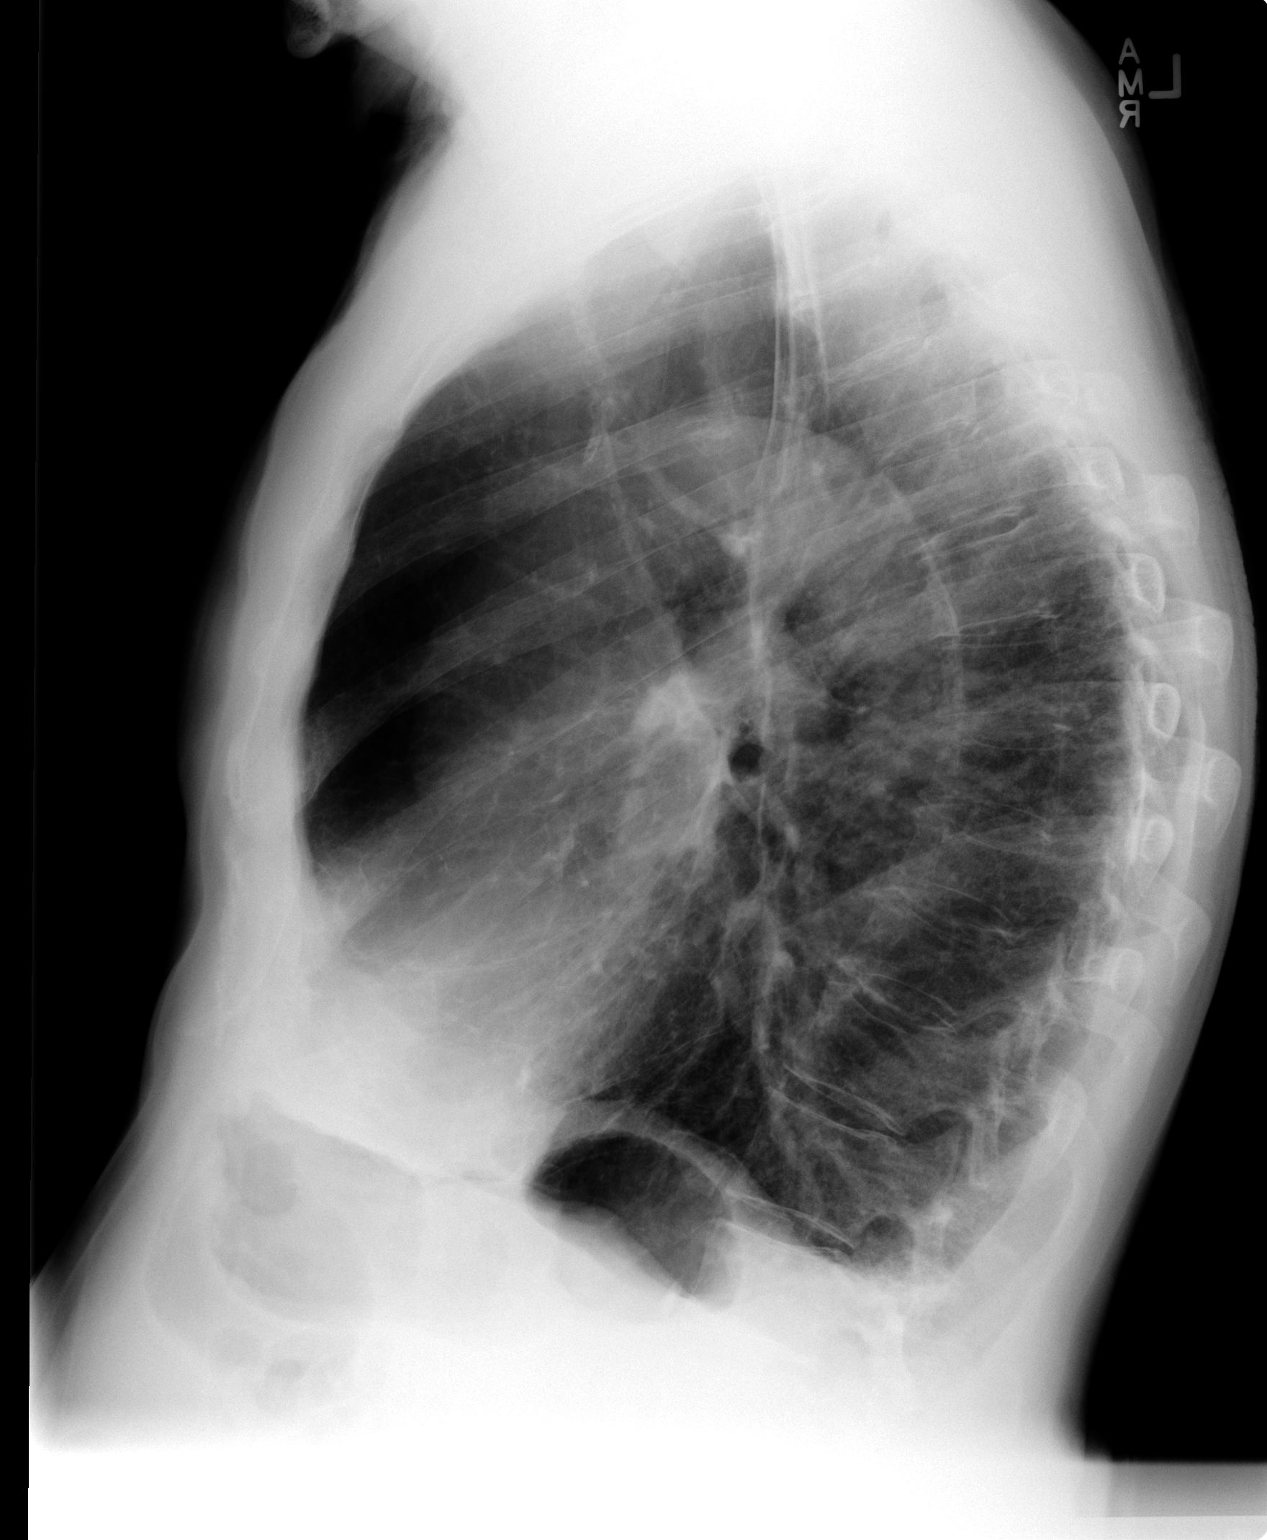

[2 of 2 positions shown; findings below may reference images not displayed]

FINDINGS: Normal heart size and pulmonary vascularity.
Calcified tortuous aorta.
Emphysematous changes consistent with COPD.
Minimal peribronchial thickening.
No acute infiltrate, pleural effusion or pneumothorax.
Bones appear demineralized.
IMPRESSION: COPD.
No acute abnormalities.

## 2012-10-02 MED ORDER — PREDNISONE 20 MG PO TABS: 20.0000 mg | ORAL_TABLET | Freq: Two times a day (BID) | ORAL | Status: DC

## 2012-10-02 MED ORDER — LEVOFLOXACIN 750 MG PO TABS: 750.0000 mg | ORAL_TABLET | Freq: Every day | ORAL | Status: AC

## 2012-10-02 NOTE — Telephone Encounter (Signed)
Please have pt go get Chest xray, make sure he is wearing oxygen

## 2012-10-02 NOTE — Telephone Encounter (Signed)
Patient aware.

## 2012-10-02 NOTE — Telephone Encounter (Signed)
Patient aware and will go and have xray.

## 2012-10-02 NOTE — Telephone Encounter (Signed)
His chest xray is normal He can go ahead and start antibiotics and prednisone, wear the oxygen Come in tomorrow to be checked

## 2012-10-02 NOTE — Telephone Encounter (Signed)
Patient has congestion and ear pain.

## 2012-10-03 ENCOUNTER — Encounter: Payer: Self-pay | Admitting: Family Medicine

## 2012-10-03 ENCOUNTER — Ambulatory Visit (INDEPENDENT_AMBULATORY_CARE_PROVIDER_SITE_OTHER): Payer: Medicare Other | Admitting: Family Medicine

## 2012-10-03 VITALS — BP 124/66 | HR 100 | Resp 20 | Ht 65.5 in | Wt 136.0 lb

## 2012-10-03 DIAGNOSIS — C61 Malignant neoplasm of prostate: Secondary | ICD-10-CM

## 2012-10-03 DIAGNOSIS — J438 Other emphysema: Secondary | ICD-10-CM

## 2012-10-03 DIAGNOSIS — J329 Chronic sinusitis, unspecified: Secondary | ICD-10-CM

## 2012-10-03 DIAGNOSIS — E039 Hypothyroidism, unspecified: Secondary | ICD-10-CM

## 2012-10-03 DIAGNOSIS — E785 Hyperlipidemia, unspecified: Secondary | ICD-10-CM

## 2012-10-03 NOTE — Patient Instructions (Signed)
Hold the prednisone Take the levaquin Continue all other meds Get fasting labs before next visit Keep Feb appt

## 2012-10-03 NOTE — Progress Notes (Signed)
  Subjective:    Patient ID: John Black, male    DOB: [birth year], 79 y.o.   [name]: [id number]  HPI   Patient presents with sinus drainage minimal cough sore throat for the past week. He was actually treated for community acquired pneumonia 4 weeks ago. 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 He has used some Neo-Synephrine nasal drops which have helped. He's been using his oxygen more than normal at home however did not bring with him today. His wheezing is at baseline LS he really exerts himself. He is using the Combivent in the Symbicort   Review of Systems   GEN- + fatigue, fever, weight loss,weakness, recent illness HEENT- denies eye drainage, change in vision,+ nasal discharge, CVS- denies chest pain, palpitations RESP- denies SOB, +cough, +wheeze ABD- denies N/V, change in stools, abd pain Neuro- denies headache, dizziness, syncope, seizure activity      Objective:   Physical Exam  GEN- NAD, alert and oriented x3 HEENT- PERRL, EOMI, non injected sclera, pink conjunctiva, MMM, oropharynx mild injection, TM clear bilat no effusion, + maxillary sinus tenderness, inflammed turbinates,  +Nasal drainage  Neck- Supple, no LAD CVS- RRR, no murmur RESP-CTAB, harsh cough  EXT- No edema Pulses- Radial 2+        Assessment & Plan:

## 2012-10-03 NOTE — Assessment & Plan Note (Signed)
With exception of his hypoxia because he does not wear his oxygen on a regular basis even though every time he comes in he is in the mid 80s his COPD appears to be at baseline with use of Combivent and Symbicort. There is no bronchospasm noted today and his chest x-ray was negative for infiltrate

## 2012-10-03 NOTE — Assessment & Plan Note (Signed)
Unfortunately he has another illness within 4 weeks of the previous I will place him on Levaquin for 10 days to cover for sinusitis as often per history this results and COPD exacerbation. He does have prednisone at home I will have him hold this unless he develops more pulmonary symptoms.

## 2012-10-18 LAB — CBC
HCT: 44.9 % (ref 39.0–52.0)
Hemoglobin: 15.1 g/dL (ref 13.0–17.0)
MCH: 28.1 pg (ref 26.0–34.0)
MCHC: 33.6 g/dL (ref 30.0–36.0)
MCV: 83.5 fL (ref 78.0–100.0)
Platelets: 364 10*3/uL (ref 150–400)
RBC: 5.38 MIL/uL (ref 4.22–5.81)
RDW: 14.1 % (ref 11.5–15.5)
WBC: 8.6 10*3/uL (ref 4.0–10.5)

## 2012-10-18 LAB — LIPID PANEL
Cholesterol: 238 mg/dL — ABNORMAL HIGH (ref 0–200)
HDL: 73 mg/dL (ref >39)
LDL Cholesterol: 131 mg/dL — ABNORMAL HIGH (ref 0–99)
Total CHOL/HDL Ratio: 3.3 Ratio
Triglycerides: 171 mg/dL — ABNORMAL HIGH (ref <150)
VLDL: 34 mg/dL (ref 0–40)

## 2012-10-18 LAB — COMPREHENSIVE METABOLIC PANEL
ALT: 19 U/L (ref 0–53)
AST: 19 U/L (ref 0–37)
Albumin: 4.2 g/dL (ref 3.5–5.2)
Alkaline Phosphatase: 54 U/L (ref 39–117)
BUN: 19 mg/dL (ref 6–23)
CO2: 28 mEq/L (ref 19–32)
Calcium: 9.8 mg/dL (ref 8.4–10.5)
Chloride: 101 mEq/L (ref 96–112)
Creat: 0.88 mg/dL (ref 0.50–1.35)
Glucose, Bld: 81 mg/dL (ref 70–99)
Potassium: 4.7 mEq/L (ref 3.5–5.3)
Sodium: 139 mEq/L (ref 135–145)
Total Bilirubin: 0.8 mg/dL (ref 0.3–1.2)
Total Protein: 6.3 g/dL (ref 6.0–8.3)

## 2012-10-18 LAB — TSH: TSH: 5.235 u[IU]/mL — ABNORMAL HIGH (ref 0.350–4.500)

## 2012-10-18 LAB — T4: T4, Total: 8.9 ug/dL (ref 5.0–12.5)

## 2012-10-18 LAB — T3, FREE: T3, Free: 3.1 pg/mL (ref 2.3–4.2)

## 2012-10-19 LAB — PSA, MEDICARE: PSA: 0.59 ng/mL (ref <=4.00)

## 2012-10-21 ENCOUNTER — Ambulatory Visit (INDEPENDENT_AMBULATORY_CARE_PROVIDER_SITE_OTHER): Payer: Medicare Other | Admitting: Family Medicine

## 2012-10-21 ENCOUNTER — Encounter: Payer: Self-pay | Admitting: Family Medicine

## 2012-10-21 VITALS — BP 118/64 | HR 96 | Resp 20 | Wt 135.0 lb

## 2012-10-21 DIAGNOSIS — G4700 Insomnia, unspecified: Secondary | ICD-10-CM

## 2012-10-21 DIAGNOSIS — E039 Hypothyroidism, unspecified: Secondary | ICD-10-CM

## 2012-10-21 DIAGNOSIS — C61 Malignant neoplasm of prostate: Secondary | ICD-10-CM

## 2012-10-21 DIAGNOSIS — J438 Other emphysema: Secondary | ICD-10-CM

## 2012-10-21 DIAGNOSIS — N400 Enlarged prostate without lower urinary tract symptoms: Secondary | ICD-10-CM

## 2012-10-21 MED ORDER — LEVOTHYROXINE SODIUM 75 MCG PO TABS: 75.0000 ug | ORAL_TABLET | Freq: Every day | ORAL | Status: DC

## 2012-10-21 MED ORDER — DOXAZOSIN MESYLATE 8 MG PO TABS: 8.0000 mg | ORAL_TABLET | Freq: Every day | ORAL | Status: DC

## 2012-10-21 MED ORDER — ZOLPIDEM TARTRATE 10 MG PO TABS: ORAL_TABLET | ORAL | Status: DC

## 2012-10-21 NOTE — Patient Instructions (Signed)
We will call about the ambien Synthroid increased to Repeat thyroid studies in 6 weeks Continue all other medications Increase fish oil to twice a day  Recommend you get shingles vaccine from pharmacy F/U 4 months

## 2012-10-21 NOTE — Progress Notes (Signed)
  Subjective:    Patient ID: John Black, male    DOB: [birth year], 79 y.o.   [name]: [id number]  HPI  Patient here to follow chronic medical problems. He was seen a few weeks ago secondary to sinusitis and COPD exacerbation. He is doing much better  uses his oxygen as needed though he will use it during the day if he gets short winded from activity. He has papers from his insurance company regarding his Ambien he's been on this medication for the past 5 years and typically takes almost every night for insomnia he's not had any daytime somnolence or other side effects.  Labs reviewed TSH was elevated his T3 and T4 looked okay he is currently on Synthroid 50 mcg  Review of Systems    GEN- denies fatigue, fever, weight loss,weakness, recent illness HEENT- denies eye drainage, change in vision, nasal discharge, CVS- denies chest pain, palpitations RESP- denies SOB, +cough, wheeze ABD- denies N/V, change in stools, abd pain GU- denies dysuria, hematuria, dribbling, incontinence MSK- denies joint pain, muscle aches, injury Neuro- denies headache, dizziness, syncope, seizure activity       Objective:   Physical Exam  GEN- NAD, alert and oriented x3 HEENT- PERRL, EOMI, non injected sclera, pink conjunctiva, MMM, oropharynx clear,  Neck- Supple,  CVS- RRR, no murmur RESP-few scattered wheeze, otherwise clear, decreased at bases ABD-NABS,soft,NT,ND EXT- No edema Pulses- Radial 2+      Assessment & Plan:

## 2012-10-22 DIAGNOSIS — G4700 Insomnia, unspecified: Secondary | ICD-10-CM | POA: Insufficient documentation

## 2012-10-22 DIAGNOSIS — N400 Enlarged prostate without lower urinary tract symptoms: Secondary | ICD-10-CM | POA: Insufficient documentation

## 2012-10-22 NOTE — Assessment & Plan Note (Signed)
Chronic insomnia, has been on ambien for past 5 years, no daytime effects, tolerates well, has not tried other meds Will do PA with insurance company

## 2012-10-22 NOTE — Assessment & Plan Note (Signed)
Increase to , recheck 8 weeks

## 2012-10-22 NOTE — Assessment & Plan Note (Signed)
On cardura, doing well

## 2012-10-22 NOTE — Assessment & Plan Note (Signed)
Nearing baseline, f/u pulmonary, continue oxygen therapy and inhalers

## 2012-10-22 NOTE — Assessment & Plan Note (Signed)
In remission PSA looks great, on cardura

## 2012-10-28 ENCOUNTER — Telehealth: Payer: Self-pay | Admitting: Family Medicine

## 2012-10-28 NOTE — Telephone Encounter (Signed)
Wife aware

## 2012-11-10 ENCOUNTER — Ambulatory Visit (INDEPENDENT_AMBULATORY_CARE_PROVIDER_SITE_OTHER): Payer: Medicare Other | Admitting: Family Medicine

## 2012-11-10 ENCOUNTER — Encounter: Payer: Self-pay | Admitting: Family Medicine

## 2012-11-10 VITALS — BP 140/78 | HR 120 | Resp 22 | Ht 65.5 in

## 2012-11-10 DIAGNOSIS — J439 Emphysema, unspecified: Secondary | ICD-10-CM

## 2012-11-10 DIAGNOSIS — J019 Acute sinusitis, unspecified: Secondary | ICD-10-CM

## 2012-11-10 DIAGNOSIS — J441 Chronic obstructive pulmonary disease with (acute) exacerbation: Secondary | ICD-10-CM | POA: Insufficient documentation

## 2012-11-10 DIAGNOSIS — J438 Other emphysema: Secondary | ICD-10-CM

## 2012-11-10 MED ORDER — ALBUTEROL SULFATE (5 MG/ML) 0.5% IN NEBU
2.5000 mg | INHALATION_SOLUTION | Freq: Once | RESPIRATORY_TRACT | Status: AC
  Administered 2012-11-10: 2.5 mg via RESPIRATORY_TRACT

## 2012-11-10 MED ORDER — ZOLPIDEM TARTRATE 10 MG PO TABS: ORAL_TABLET | ORAL | Status: DC

## 2012-11-10 MED ORDER — PREDNISONE 10 MG PO TABS: ORAL_TABLET | ORAL | Status: DC

## 2012-11-10 MED ORDER — SODIUM CHLORIDE 0.9 % IV SOLN: 125.0000 mg | Freq: Once | INTRAVENOUS | Status: DC

## 2012-11-10 MED ORDER — SODIUM CHLORIDE 0.9 % IV SOLN
125.0000 mg | Freq: Once | INTRAVENOUS | Status: AC
  Administered 2012-11-10: 130 mg via INTRAMUSCULAR

## 2012-11-10 MED ORDER — AMOXICILLIN-POT CLAVULANATE 875-125 MG PO TABS: 1.0000 | ORAL_TABLET | Freq: Two times a day (BID) | ORAL | Status: AC

## 2012-11-10 NOTE — Assessment & Plan Note (Signed)
Per above, augmentin treatment

## 2012-11-10 NOTE — Progress Notes (Signed)
  Subjective:    Patient ID: John Black, male    DOB: 1929-05-15, 79 y.o.   [name]: [id number]  HPI  Patient presents with cough with production shortness of breath sinus pressure and drainage for the past 3 days. He was out about this week and with his family noticed that he was getting sick. He did not use his oxygen throughout the weekend but did use it this morning. He's been wheezing more than usual he completed a course of antibiotics about 3-1/2 weeks ago. This is now his third bout with his COPD this winter.  Note he did not bring his oxygen with him  Review of Systems   GEN- denies fatigue, fever, weight loss,weakness, recent illness HEENT- denies eye drainage, change in vision, nasal discharge, CVS- denies chest pain, palpitations RESP- denies SOB, +cough, +wheeze ABD- denies N/V, change in stools, abd pain GU- denies dysuria, hematuria, dribbling, incontinence MSK- denies joint pain, muscle aches, injury Neuro- denies headache, dizziness, syncope, seizure activity      Objective:   Physical Exam GEN- NAD, alert and oriented x3, hypoxia 85% HEENT- PERRL, EOMI, non injected sclera, pink conjunctiva, MMM, oropharynx mild injection, + maxillary sinus tenderness, nares clear rhinorrhea Neck- Supple, no LAD CVS- Tachycardia ( HR 110)  no murmur RESP-decreased air movement, minimal bronchospasm, increased WOB EXT- No edema Pulses- Radial, DP- 2+  S/P Neb- improved WOB, good air movement      Assessment & Plan:

## 2012-11-10 NOTE — Patient Instructions (Signed)
Start prednisone taper tomorrow Start antibiotic Use her oxygen at all times including sleep until your improved Keep followup with lung doctor Call if you do not improve or go to the emergency room if you worsen

## 2012-11-10 NOTE — Addendum Note (Signed)
Addended by: Kandis Fantasia B on: 11/10/2012 05:09 PM   Modules accepted: Orders

## 2012-11-10 NOTE — Assessment & Plan Note (Signed)
Recurrent exacerbation. This time I will given IM Solu-Medrol with a prolonged taper of steroids as he does very well on the steroids. He actually has a family trip this weekend and he is trying to make. The biggest thing is his hypoxia which he can be very stubborn about wearing his oxygen on a regular basis. He will continue the Combivent in the Spiriva he will also be placed on Augmentin as he has had Levaquin the last 2 times. He has followup arranged with his pulmonologist in 2 weeks

## 2012-11-24 ENCOUNTER — Telehealth: Payer: Self-pay | Admitting: Family Medicine

## 2012-11-24 NOTE — Telephone Encounter (Signed)
Please have him get sudafed over the counter and take as directed for the next 3 days Also get the nasal saline to rinse the sinuses No further antibiotics needed right now

## 2012-11-24 NOTE — Telephone Encounter (Signed)
States he's not feeling too bad but his sinuses are so stopped up he has to breathe through his mouth and he's not even able to ear his o2 because his nasal passages being so clogged up

## 2012-11-24 NOTE — Telephone Encounter (Signed)
Patient aware.

## 2012-11-28 ENCOUNTER — Encounter: Payer: Self-pay | Admitting: Pulmonary Disease

## 2012-11-28 ENCOUNTER — Ambulatory Visit (INDEPENDENT_AMBULATORY_CARE_PROVIDER_SITE_OTHER): Payer: Medicare Other | Admitting: Pulmonary Disease

## 2012-11-28 VITALS — BP 122/72 | HR 118 | Temp 98.2°F | Ht 66.0 in | Wt 132.4 lb

## 2012-11-28 DIAGNOSIS — J329 Chronic sinusitis, unspecified: Secondary | ICD-10-CM | POA: Insufficient documentation

## 2012-11-28 DIAGNOSIS — J438 Other emphysema: Secondary | ICD-10-CM

## 2012-11-28 NOTE — Progress Notes (Signed)
  Subjective:    Patient ID: John Black, male    DOB: [birth year], 79 y.o.   [name]: [id number]  HPI The patient comes in today for an acute sick visit.  He has significant underlying emphysema and exercise-induced hypoxemia with poor compliance on exertional oxygen.  He has been having great difficulty with recurrent sinus and chest congestion, despite being on multiple rounds of antibiotics and prednisone.  Each time that he is taken off antibiotics, he has a recurrence of his sinus congestion, purulence, and ultimately increased chest symptoms.  He has not had imaging of his sinuses.  The patient has been compliant with his maintenance bronchodilators, but has had increased dyspnea on exertion since having this issue.   Review of Systems  Constitutional: Negative for fever and unexpected weight change.  HENT: Positive for congestion, rhinorrhea, postnasal drip and sinus pressure. Negative for ear pain, nosebleeds, sore throat, sneezing, trouble swallowing and dental problem.   Eyes: Negative for redness and itching.  Respiratory: Positive for shortness of breath. Negative for cough, chest tightness and wheezing.   Cardiovascular: Negative for palpitations and leg swelling.  Gastrointestinal: Negative for nausea and vomiting.  Genitourinary: Positive for difficulty urinating ( while on OTC pseud). Negative for dysuria.  Musculoskeletal: Negative for joint swelling.  Skin: Negative for rash.  Neurological: Negative for headaches.  Hematological: Does not bruise/bleed easily.  Psychiatric/Behavioral: Negative for dysphoric mood. The patient is not nervous/anxious.        Objective:   Physical Exam Thin male in no acute distress.  Some increased work of breathing with prolonged conversation. Nose very narrowed bilaterally, mild swelling and erythema, but no definite purulence. Oropharynx clear Neck without lymphadenopathy or thyromegaly Chest with decreased breath sounds, but adequate  air flow and no wheezing Cardiac exam with regular rate and rhythm Lower extremities with no edema, no cyanosis Alert and oriented, moves all 4 extremities.       Assessment & Plan:

## 2012-11-28 NOTE — Patient Instructions (Addendum)
Will schedule for scan of your sinuses.  Will call you with results. Use neil med sinus rinses am and pm until better.  nasonex 2 sprays each nostril each am (after rinses) Trial of tudorza one inhalation each am and pm in addition to symbicort Use combivent only for rescue.  Stay on oxygen anytime you leave the house (because you are going to be moving around).  Can take off if you sit for periods of time. Would like to see you back in 2 weeks to see how things are going.

## 2012-11-28 NOTE — Assessment & Plan Note (Signed)
The patient has been having increasing breathing issues because of his recurrent sinopulmonary infections.  He has not seen any difference with Spiriva in the past, but will try him on tudorza.  I also stressed to him again the importance of wearing oxygen consistently when he is away from home.  I suspect he will continue to have issues with his breathing unless we can totally resolved his ongoing sinus symptoms.

## 2012-11-28 NOTE — Assessment & Plan Note (Signed)
The patient's history is most consistent with underlying chronic sinusitis with intermittent acute episodes.  I have explained to him this can result in recurrent exacerbations of his underlying lung disease.  I would like to image his sinuses to see if he has persistent thickening and air-fluid levels.  I would also like for him to work on more aggressive nasal hygiene regimen.

## 2012-12-02 ENCOUNTER — Ambulatory Visit (HOSPITAL_COMMUNITY): Payer: Medicare Other

## 2012-12-10 ENCOUNTER — Telehealth: Payer: Self-pay | Admitting: Pulmonary Disease

## 2012-12-10 NOTE — Telephone Encounter (Signed)
Let pt know that I am concerned that his sinuses are aggravating his copd.  I think he needs the scan, and would call his insurance company and complain.  Would consider changing insurances.   Would like to send him to ENT for further evaluation.  See if he has ever seen one, and who.  If not, is he willing to see someone.  This may be one way to circumvent the idiocy of his insurance company.

## 2012-12-10 NOTE — Telephone Encounter (Signed)
Pt has been contacted and is aware that CT Sinus has been denied by [facility]. Pt is also aware that we did an appeal and this was also denied. Pt has an appointment scheduled on Friday 12/12/12 with Dr. Shelle Iron and is wanting to know if he still needs to keep this appointment? Please advise. Thanks, Ollen Gross

## 2012-12-11 NOTE — Telephone Encounter (Signed)
Called, spoke with pt. Informed him of below per [facility]. Pt states he has never seen ENT and will have to "think about it."  Pt states he "is not quite sure" he agrees with the idea that his sinuses are aggravating his copd.  Therefore, he will have to think about ENT referral. He does have a pending OV with [facility] on Friday, March 28.  He would like to know if [facility] would still like him to come in.   KC, pls advise.  Thank you.

## 2012-12-12 ENCOUNTER — Ambulatory Visit: Payer: Medicare Other | Admitting: Pulmonary Disease

## 2012-12-12 NOTE — Telephone Encounter (Signed)
Called, spoke with pt. Informed him of below per Dr. Shelle Iron. He verbalized understanding of this. Pt states he "feels fine."  States he no longer has drainage. Reports he hasn't noticed any difference with the [employer] yet except the "sinus issues have cleared up."  He still have 1 sample of Tudorza left.  He will cont with it and call back with an update on how it is doing. He would like to cancel OV today and reschedule in 4 months. We have rescheduled OV to April 10, 2013 at 10:45am. Pt to call back if anything is needed prior to this or if symptoms do not improve or worsen.

## 2012-12-12 NOTE — Telephone Encounter (Signed)
Let him know chronic sinus disease does flare up lung disease. I wanted to see him back to see if tudorza helped him, and to see if breathing stabilized. If he feels he is doing ok, and back to his baseline would just see him back in 4mos .

## 2012-12-13 LAB — TSH: TSH: 1.789 u[IU]/mL (ref 0.350–4.500)

## 2012-12-13 LAB — T4: T4, Total: 9 ug/dL (ref 5.0–12.5)

## 2012-12-13 LAB — T3, FREE: T3, Free: 3 pg/mL (ref 2.3–4.2)

## 2012-12-16 ENCOUNTER — Encounter: Payer: Self-pay | Admitting: Family Medicine

## 2012-12-16 ENCOUNTER — Ambulatory Visit (INDEPENDENT_AMBULATORY_CARE_PROVIDER_SITE_OTHER): Payer: Medicare Other | Admitting: Family Medicine

## 2012-12-16 VITALS — BP 104/60 | HR 111 | Resp 21 | Wt 132.0 lb

## 2012-12-16 DIAGNOSIS — E039 Hypothyroidism, unspecified: Secondary | ICD-10-CM

## 2012-12-16 DIAGNOSIS — J309 Allergic rhinitis, unspecified: Secondary | ICD-10-CM

## 2012-12-16 DIAGNOSIS — J438 Other emphysema: Secondary | ICD-10-CM

## 2012-12-16 NOTE — Progress Notes (Signed)
  Subjective:    Patient ID: John Black, male    DOB: Nov 11, 1928, 79 y.o.   [name]: [id number]  HPI  Patient here for interim followup. His thyroid studies were normal these were reviewed with him. We also discussed his recent visit to his pulmonologist. He was recommended to have a CT scan in ear nose and throat referral his insurance denied the CT scan and he did not want to see Ear nose and throat at this time because his sinus congestion and drainage has cleared. He does not quite see the connection between his sinuses and his COPD. He was started on Nasonex as well as the new inhaler for his emphysema however he has not seen any difference with either one of them. He does note that he gets some allergy symptoms of sneezing which will offset his cough  Review of Systems  - per above  GEN- denies fatigue, fever, weight loss,weakness, recent illness HEENT- denies eye drainage, change in vision, nasal discharge, CVS- denies chest pain, palpitations RESP- denies SOB, cough, wheeze        Objective:   Physical Exam GEN- NAD, alert and oriented x3 HEENT- PERRL, EOMI, non injected sclera, pink conjunctiva, MMM, oropharynx mild injection, TM clear bilat no effusion, no maxillary sinus tenderness, mildly inflammed turbinates,  Scab right nares Neck- Supple, no LAD CVS- RRR, no murmur RESP-CTAB,decreased at bases no wheeze EXT- No edema Pulses- Radial 2+         Assessment & Plan:

## 2012-12-16 NOTE — Patient Instructions (Signed)
Try the claritin for allergies If you continue to have difficulties with sinus drainage we will refer to ENT Use sunscreen Follow-up with Dermatology  F/U June

## 2012-12-17 NOTE — Assessment & Plan Note (Signed)
Levels stable.  

## 2012-12-17 NOTE — Assessment & Plan Note (Addendum)
Reviewed his pulmonary note, he is using oxygen more, he has not seen much change with the additional Turdoza I tried to explian how other respiratory infections could lead to more COPD exacerbations such as the sinuses, he wants to hold on ENT

## 2012-12-17 NOTE — Assessment & Plan Note (Signed)
Continue with nasonex for now, he was on flonase in past and this did not help

## 2013-01-13 ENCOUNTER — Encounter: Payer: Self-pay | Admitting: *Deleted

## 2013-02-03 ENCOUNTER — Other Ambulatory Visit: Payer: Self-pay | Admitting: Pulmonary Disease

## 2013-02-06 ENCOUNTER — Other Ambulatory Visit: Payer: Self-pay | Admitting: Family Medicine

## 2013-02-24 ENCOUNTER — Ambulatory Visit (INDEPENDENT_AMBULATORY_CARE_PROVIDER_SITE_OTHER): Payer: Medicare Other | Admitting: Family Medicine

## 2013-02-24 ENCOUNTER — Ambulatory Visit: Payer: Medicare Other | Admitting: Family Medicine

## 2013-02-24 VITALS — BP 110/80 | HR 68 | Temp 97.4°F | Resp 18 | Ht 66.0 in | Wt 132.0 lb

## 2013-02-24 DIAGNOSIS — E039 Hypothyroidism, unspecified: Secondary | ICD-10-CM

## 2013-02-24 DIAGNOSIS — J438 Other emphysema: Secondary | ICD-10-CM

## 2013-02-24 DIAGNOSIS — J309 Allergic rhinitis, unspecified: Secondary | ICD-10-CM

## 2013-02-24 DIAGNOSIS — J329 Chronic sinusitis, unspecified: Secondary | ICD-10-CM

## 2013-02-24 DIAGNOSIS — E785 Hyperlipidemia, unspecified: Secondary | ICD-10-CM

## 2013-02-24 NOTE — Patient Instructions (Addendum)
Continue current medications  No changes made Cholesterol rechecked 1 week before next visit F/U 4 months - Wellness Exam

## 2013-02-26 ENCOUNTER — Encounter: Payer: Self-pay | Admitting: Family Medicine

## 2013-02-26 MED ORDER — IPRATROPIUM-ALBUTEROL 18-103 MCG/ACT IN AERO: INHALATION_SPRAY | RESPIRATORY_TRACT | Status: DC

## 2013-02-26 NOTE — Assessment & Plan Note (Signed)
Currently at baseline, no change to meds, combivent refilled

## 2013-02-26 NOTE — Assessment & Plan Note (Signed)
improved

## 2013-02-26 NOTE — Progress Notes (Signed)
  Subjective:    Patient ID: John Black, male    DOB: [birth year], 79 y.o.   [name]: [id number]  HPI  Pt here to f/u chronic medical problems , no specific concerns. Doing well with COPD currently, uses oxygen as needed, more than previously Claritin 5mg  has been keeping his sinuses clear. He was also seen by dermatology had skin cancer removed from his chest using topical antibiotic as prescribed by dermatology Medications reviewed    Review of Systems  GEN- denies fatigue, fever, weight loss,weakness, recent illness HEENT- denies eye drainage, change in vision, nasal discharge, CVS- denies chest pain, palpitations RESP- denies SOB, cough, wheeze ABD- denies N/V, change in stools, abd pain GU- denies dysuria, hematuria, dribbling, incontinence MSK- denies joint pain, muscle aches, injury Neuro- denies headache, dizziness, syncope, seizure activity '    Objective:   Physical Exam GEN- NAD, alert and oriented x3 HEENT- PERRL, EOMI, non injected sclera, pink conjunctiva, MMM, oropharynx clear Neck- Supple, no LAD CVS- RRR, no murmur RESP-few scattered wheeze, normal WOB, good air movement EXT- No edema Pulses- Radial 2+ Skin- quarter size biopsy lesion on chest, no pus noted, granulation tissue       Assessment & Plan:

## 2013-02-26 NOTE — Assessment & Plan Note (Signed)
Much improved with use of anti-histamine and nasal spray

## 2013-03-05 ENCOUNTER — Ambulatory Visit (INDEPENDENT_AMBULATORY_CARE_PROVIDER_SITE_OTHER): Payer: Medicare Other | Admitting: Physician Assistant

## 2013-03-05 ENCOUNTER — Encounter: Payer: Self-pay | Admitting: Physician Assistant

## 2013-03-05 VITALS — BP 124/80 | HR 88 | Temp 98.7°F | Resp 20 | Ht 64.5 in | Wt 131.0 lb

## 2013-03-05 DIAGNOSIS — J440 Chronic obstructive pulmonary disease with acute lower respiratory infection: Secondary | ICD-10-CM

## 2013-03-05 MED ORDER — AZITHROMYCIN 250 MG PO TABS: ORAL_TABLET | ORAL | Status: DC

## 2013-03-05 MED ORDER — PREDNISONE 20 MG PO TABS: 20.0000 mg | ORAL_TABLET | Freq: Every day | ORAL | Status: DC

## 2013-03-05 NOTE — Progress Notes (Signed)
Patient ID: John Black [name]: [id number], DOB: 08-01-1929, 79 y.o. Date of Encounter: 03/05/2013, 1:40 PM    Chief Complaint:  Chief Complaint  Patient presents with  . has COPD  c/o a flare up  and fever  101.8 at home last nigh     HPI: 79 y.o. year old white male - says he usually sees Dr. Shelle Iron every 6 months and sees Dr. Jeanice Lim routinely for primary care. He last saw her for a ROV 02/24/13. I reviewed that OV note. At that time, his COPD was stable.  He is here today because he has had increased SOB/wheezing in the past 2-3 days. Last night had fever of 101.8. He says he has had "the same problem before" and knew he better come in before it got too bad.  He has been using Symbicort routinely and using Combivent. Uses oxygen prn. (not wearing O2 now).   Home Meds: See attached medication section for any medications that were entered at today's visit. The computer does not put those onto this list.The following list is a list of meds entered prior to today's visit.   Current Outpatient Prescriptions on File Prior to Visit  Medication Sig Dispense Refill  . Aclidinium Bromide (TUDORZA PRESSAIR) 400 MCG/ACT AEPB Inhale 1 puff into the lungs 2 (two) times daily.      Marland Kitchen albuterol-ipratropium (COMBIVENT) 18-103 MCG/ACT inhaler Inhale 2 puffs every 4-6 hours as needed  3 Inhaler  4  . budesonide-formoterol (SYMBICORT) 160-4.5 MCG/ACT inhaler Inhale 2 puffs into the lungs 2 (two) times daily.  102 g  3  . Calcium-Magnesium-Vitamin D (CITRACAL CALCIUM+D PO) Once a day       . doxazosin (CARDURA) 8 MG tablet Take 1 tablet (8 mg total) by mouth daily.  90 tablet  1  . levothyroxine (SYNTHROID, LEVOTHROID) 75 MCG tablet Take 1 tablet (75 mcg total) by mouth daily.  90 tablet  1  . loratadine (CLARITIN) 5 MG chewable tablet Chew 5 mg by mouth daily.      . mometasone (NASONEX) 50 MCG/ACT nasal spray Place 2 sprays into the nose daily.      . Multiple Vitamins-Minerals (CVS SPECTRAVITE SENIOR  PO) Once a day       . Omega-3 Fatty Acids (FISH OIL PO) Take 1,000 Units by mouth 2 (two) times daily.       . SYMBICORT 160-4.5 MCG/ACT inhaler INHALE 2 PUFFS INTO THE LUNGS 2 (TWO) TIMES DAILY.  1 Inhaler  1  . zolpidem (AMBIEN) 10 MG tablet TAKE 1/2 TO 1 TABLET AT BEDTIME AS NEEDED  30 tablet  2   No current facility-administered medications on file prior to visit.    Allergies:  Allergies  Allergen Reactions  . Morphine     REACTION: sweats      Review of Systems: See HPI for pertinent ROS. All other ROS negative.    Physical Exam: Blood pressure 124/80, pulse 88, temperature 98.7 F (37.1 C), temperature source Oral, resp. rate 20, height 5' 4.5" (1.638 m), weight 131 lb (59.421 kg), SpO2 90.00%., Body mass index is 22.15 kg/(m^2). General: WNWD WM. Does not sound dyspneic when talking.   Appears in no acute distress. HEENT: Normocephalic, atraumatic, eyes without discharge, sclera non-icteric, nares are without discharge. Bilateral auditory canals clear, TM's are without perforation, pearly grey and translucent with reflective cone of light bilaterally. Oral cavity moist, posterior pharynx without exudate, erythema, peritonsillar abscess, or post nasal drip.  Neck: Supple. No thyromegaly. No  lymphadenopathy. Lungs: He is not using accessory muscles. There is very slight wheeze throughout bilaterallly. Good air movement. Does not appear dyspneic.   Heart: Regular rhythm.  Msk:  Strength and tone normal for age. Neuro: Alert and oriented X 3. Moves all extremities spontaneously. Gait is normal. CNII-XII grossly in tact. Psych:  Responds to questions appropriately with a normal affect.     ASSESSMENT AND PLAN:  79 y.o. year old male with  1. Bronchitis, chronic obstructive w acute bronchitis Will add antibiotic and oral prednisone. Cont Symbicort and Combivent. Cont O2 prn. F/U if worsens or if does not resolve/return to baseline by completion of prednisone and  azithromycin. - predniSONE (DELTASONE) 20 MG tablet; Take 1 tablet (20 mg total) by mouth daily. Take 2 daily for 5 days then 1 daily for 3 days  Dispense: 13 tablet; Refill: 0 - azithromycin (ZITHROMAX) 250 MG tablet; Day 1; Take 2 daily.  Days 2-5: Take one daily  Dispense: 6 tablet; Refill: 0   Signed, [address] [place], [place], [facility] 03/05/2013 1:40 PM

## 2013-03-24 ENCOUNTER — Encounter: Payer: Self-pay | Admitting: Pulmonary Disease

## 2013-03-31 ENCOUNTER — Other Ambulatory Visit: Payer: Self-pay | Admitting: Pulmonary Disease

## 2013-04-10 ENCOUNTER — Ambulatory Visit: Payer: Medicare Other | Admitting: Pulmonary Disease

## 2013-04-16 ENCOUNTER — Telehealth: Payer: Self-pay | Admitting: Family Medicine

## 2013-04-16 MED ORDER — LEVOTHYROXINE SODIUM 75 MCG PO TABS: 75.0000 ug | ORAL_TABLET | Freq: Every day | ORAL | Status: DC

## 2013-04-16 NOTE — Telephone Encounter (Signed)
Med refilled.

## 2013-05-07 ENCOUNTER — Encounter: Payer: Self-pay | Admitting: Pulmonary Disease

## 2013-05-07 ENCOUNTER — Telehealth: Payer: Self-pay | Admitting: Pulmonary Disease

## 2013-05-07 ENCOUNTER — Ambulatory Visit (INDEPENDENT_AMBULATORY_CARE_PROVIDER_SITE_OTHER): Payer: Medicare Other | Admitting: Pulmonary Disease

## 2013-05-07 VITALS — BP 122/76 | HR 97 | Temp 98.8°F | Ht 66.0 in | Wt 134.0 lb

## 2013-05-07 DIAGNOSIS — J438 Other emphysema: Secondary | ICD-10-CM

## 2013-05-07 DIAGNOSIS — J439 Emphysema, unspecified: Secondary | ICD-10-CM

## 2013-05-07 NOTE — Progress Notes (Signed)
  Subjective:    Patient ID: John Black, male    DOB: 1929-08-01, 79 y.o.   [name]: [id number]  HPI The patient comes in today for followup of his known COPD with chronic respiratory failure.  He is wearing his oxygen a little more, but is still noncompliant with exertional activities.  He is staying on symbicort, and did not see any difference with tudorza.  He feels his breathing may be a little worse than prior visit, but it may be associated with high heat and humidity of the summer.  I've been trying to get him to attend pulmonary rehabilitation, and he will consider this.  He is also interested in a portable concentrator so that he can be more compliant with exertional oxygen.  He denies any purulence, or worsening chest congestion   Review of Systems  Constitutional: Negative for fever and unexpected weight change.  HENT: Negative for ear pain, nosebleeds, congestion, sore throat, rhinorrhea, sneezing, trouble swallowing, dental problem, postnasal drip and sinus pressure.   Eyes: Negative for redness and itching.  Respiratory: Positive for shortness of breath. Negative for cough, chest tightness and wheezing.   Cardiovascular: Negative for palpitations and leg swelling.  Gastrointestinal: Negative for nausea and vomiting.  Genitourinary: Negative for dysuria.  Musculoskeletal: Negative for joint swelling.  Skin: Negative for rash.  Neurological: Negative for headaches.  Hematological: Does not bruise/bleed easily.  Psychiatric/Behavioral: Negative for dysphoric mood. The patient is not nervous/anxious.        Objective:   Physical Exam Thin male in no acute distress Nose without purulence or discharge noted Neck without lymphadenopathy or thyromegaly Chest with decreased breath sounds throughout, no wheezing Cardiac exam with regular rate and rhythm Lower extremities without edema, no cyanosis Alert and oriented, moves all 4 extremities.       Assessment & Plan:

## 2013-05-07 NOTE — Patient Instructions (Addendum)
Try anoro one inhalation each am everyday for the next few weeks.  Hold symbicort during this time.  Rinse mouth well. If the anoro helps more than symbicort, let us know and we can call in prescription.  If doesn't help, get back on symbicort. Will refer to pulmonary rehab program at [facility] Will see about getting you a portable oxygen concentrator. followup with me in 4 mos if doing well.

## 2013-05-07 NOTE — Telephone Encounter (Signed)
KC would you be okay with this order? Please advise. Thanks.

## 2013-05-07 NOTE — Assessment & Plan Note (Signed)
The patient overall is not far from his usual baseline, but has seemed a little more dyspnea on exertion over the last few months.  It is unclear how much of this is secondary to the heat and humidity, but he is also not wearing his oxygen with exertion on a regular basis.  Part of this is because of inconvenience, and he is interested in a [employer], [employer].  I would like to try him on anoro to see if he sees improvement, but if not should stay on symbicort.   I have also stressed to him the importance for pulmonary rehabilitation, and he is willing to consider attending the program in [place].

## 2013-05-08 NOTE — Telephone Encounter (Signed)
Ok to evaluate pt for pulsed oxygen.

## 2013-05-08 NOTE — Telephone Encounter (Signed)
Order placed to [facility] to send to [facility].

## 2013-05-15 ENCOUNTER — Telehealth: Payer: Self-pay | Admitting: Pulmonary Disease

## 2013-05-15 ENCOUNTER — Other Ambulatory Visit: Payer: Self-pay | Admitting: Pulmonary Disease

## 2013-05-15 MED ORDER — BUDESONIDE-FORMOTEROL FUMARATE 160-4.5 MCG/ACT IN AERO: 2.0000 | INHALATION_SPRAY | Freq: Two times a day (BID) | RESPIRATORY_TRACT | Status: DC

## 2013-05-15 NOTE — Telephone Encounter (Signed)
Patient Instructions    Try anoro one inhalation each am everyday for the next few weeks.  Hold symbicort during this time.  Rinse mouth well. If the anoro helps more than symbicort, let us know and we can call in prescription.  If doesn't help, get back on symbicort   Per pt he tried [facility] and does not feel like it is helping. He has not noticed any difference since using this and has been on this x 8 days now. He is wanting to go back to symbicort and wants RX sent to the pharmacy. I advised will do so and will forward to Dr. Shelle Iron as an Lorain Childes

## 2013-05-28 ENCOUNTER — Ambulatory Visit (INDEPENDENT_AMBULATORY_CARE_PROVIDER_SITE_OTHER): Payer: Medicare Other | Admitting: *Deleted

## 2013-05-28 DIAGNOSIS — Z23 Encounter for immunization: Secondary | ICD-10-CM

## 2013-06-02 ENCOUNTER — Telehealth: Payer: Self-pay | Admitting: Family Medicine

## 2013-06-02 MED ORDER — DOXAZOSIN MESYLATE 8 MG PO TABS: 8.0000 mg | ORAL_TABLET | Freq: Every day | ORAL | Status: DC

## 2013-06-02 NOTE — Telephone Encounter (Signed)
Meds refilled.

## 2013-06-02 NOTE — Telephone Encounter (Signed)
Doxazosin Mesylate 8 mg tab 1 QD #90

## 2013-06-05 ENCOUNTER — Telehealth: Payer: Self-pay | Admitting: Family Medicine

## 2013-06-05 NOTE — Telephone Encounter (Signed)
Ambien 10 mg tab 1/2 to 1 QHS prn last rf 05/06/13 Doxazosin Mesylate 8 mg tab 1 QD #90

## 2013-06-05 NOTE — Telephone Encounter (Signed)
Ok to refill 

## 2013-06-05 NOTE — Telephone Encounter (Signed)
Okay to refill? 

## 2013-06-08 MED ORDER — ZOLPIDEM TARTRATE 10 MG PO TABS: ORAL_TABLET | ORAL | Status: DC

## 2013-06-08 NOTE — Telephone Encounter (Signed)
Meds refilled.

## 2013-06-16 ENCOUNTER — Telehealth: Payer: Self-pay | Admitting: Family Medicine

## 2013-06-16 DIAGNOSIS — E785 Hyperlipidemia, unspecified: Secondary | ICD-10-CM

## 2013-06-16 DIAGNOSIS — E039 Hypothyroidism, unspecified: Secondary | ICD-10-CM

## 2013-06-16 DIAGNOSIS — C61 Malignant neoplasm of prostate: Secondary | ICD-10-CM

## 2013-06-16 NOTE — Telephone Encounter (Signed)
Pt wants to know if you can order a PSA for him for when he gets his labs done before his appt next week.

## 2013-06-16 NOTE — Telephone Encounter (Signed)
Labs ordered pt needs to be fasting

## 2013-06-16 NOTE — Telephone Encounter (Signed)
Pt aware.

## 2013-06-24 ENCOUNTER — Other Ambulatory Visit: Payer: Self-pay | Admitting: Family Medicine

## 2013-06-24 LAB — TSH: TSH: 4.552 u[IU]/mL — ABNORMAL HIGH (ref 0.350–4.500)

## 2013-06-24 LAB — LIPID PANEL
Cholesterol: 224 mg/dL — ABNORMAL HIGH (ref 0–200)
HDL: 69 mg/dL (ref >39)
LDL Cholesterol: 135 mg/dL — ABNORMAL HIGH (ref 0–99)
Total CHOL/HDL Ratio: 3.2 Ratio
Triglycerides: 102 mg/dL (ref <150)
VLDL: 20 mg/dL (ref 0–40)

## 2013-06-26 ENCOUNTER — Ambulatory Visit (INDEPENDENT_AMBULATORY_CARE_PROVIDER_SITE_OTHER): Payer: Medicare Other | Admitting: Family Medicine

## 2013-06-26 VITALS — BP 128/66 | HR 84 | Temp 98.1°F | Resp 20 | Ht 64.0 in | Wt 136.0 lb

## 2013-06-26 DIAGNOSIS — J439 Emphysema, unspecified: Secondary | ICD-10-CM

## 2013-06-26 DIAGNOSIS — E785 Hyperlipidemia, unspecified: Secondary | ICD-10-CM

## 2013-06-26 DIAGNOSIS — E039 Hypothyroidism, unspecified: Secondary | ICD-10-CM

## 2013-06-26 DIAGNOSIS — J438 Other emphysema: Secondary | ICD-10-CM

## 2013-06-26 DIAGNOSIS — C61 Malignant neoplasm of prostate: Secondary | ICD-10-CM

## 2013-06-26 DIAGNOSIS — Z0000 Encounter for general adult medical examination without abnormal findings: Secondary | ICD-10-CM

## 2013-06-26 LAB — CBC WITH DIFFERENTIAL/PLATELET
Basophils Absolute: 0 10*3/uL (ref 0.0–0.1)
Basophils Relative: 0 % (ref 0–1)
Eosinophils Absolute: 0.2 10*3/uL (ref 0.0–0.7)
Eosinophils Relative: 3 % (ref 0–5)
HCT: 44.1 % (ref 39.0–52.0)
Hemoglobin: 14.7 g/dL (ref 13.0–17.0)
Lymphocytes Relative: 31 % (ref 12–46)
Lymphs Abs: 2.1 10*3/uL (ref 0.7–4.0)
MCH: 28.7 pg (ref 26.0–34.0)
MCHC: 33.3 g/dL (ref 30.0–36.0)
MCV: 86 fL (ref 78.0–100.0)
Monocytes Absolute: 0.7 10*3/uL (ref 0.1–1.0)
Monocytes Relative: 10 % (ref 3–12)
Neutro Abs: 3.8 10*3/uL (ref 1.7–7.7)
Neutrophils Relative %: 56 % (ref 43–77)
Platelets: 266 10*3/uL (ref 150–400)
RBC: 5.13 MIL/uL (ref 4.22–5.81)
RDW: 14.1 % (ref 11.5–15.5)
WBC: 6.8 10*3/uL (ref 4.0–10.5)

## 2013-06-26 LAB — COMPREHENSIVE METABOLIC PANEL
ALT: 22 U/L (ref 0–53)
AST: 26 U/L (ref 0–37)
Albumin: 4.5 g/dL (ref 3.5–5.2)
Alkaline Phosphatase: 53 U/L (ref 39–117)
BUN: 19 mg/dL (ref 6–23)
CO2: 25 mEq/L (ref 19–32)
Calcium: 10 mg/dL (ref 8.4–10.5)
Chloride: 104 mEq/L (ref 96–112)
Creat: 0.96 mg/dL (ref 0.50–1.35)
Glucose, Bld: 77 mg/dL (ref 70–99)
Potassium: 5.5 mEq/L — ABNORMAL HIGH (ref 3.5–5.3)
Sodium: 141 mEq/L (ref 135–145)
Total Bilirubin: 0.6 mg/dL (ref 0.3–1.2)
Total Protein: 6.5 g/dL (ref 6.0–8.3)

## 2013-06-26 LAB — T3, FREE: T3, Free: 3 pg/mL (ref 2.3–4.2)

## 2013-06-26 LAB — PSA, MEDICARE: PSA: 0.6 ng/mL (ref <=4.00)

## 2013-06-26 LAB — TSH: TSH: 2.719 u[IU]/mL (ref 0.350–4.500)

## 2013-06-26 LAB — T4, FREE: Free T4: 1.36 ng/dL (ref 0.80–1.80)

## 2013-06-26 MED ORDER — ZOLPIDEM TARTRATE 10 MG PO TABS: ORAL_TABLET | ORAL | Status: DC

## 2013-06-26 MED ORDER — IPRATROPIUM-ALBUTEROL 20-100 MCG/ACT IN AERS: 1.0000 | INHALATION_SPRAY | Freq: Four times a day (QID) | RESPIRATORY_TRACT | Status: DC

## 2013-06-26 NOTE — Patient Instructions (Signed)
I will contact oxygen supplier  Message will be sent about pulmonary rehab Prescriptions for John Black New combivent is only 1 puff four times a day We will call with lab results I will check into the genetic disorder F/U 4 months

## 2013-06-28 ENCOUNTER — Encounter: Payer: Self-pay | Admitting: Family Medicine

## 2013-06-28 DIAGNOSIS — E785 Hyperlipidemia, unspecified: Secondary | ICD-10-CM | POA: Insufficient documentation

## 2013-06-28 NOTE — Assessment & Plan Note (Signed)
Check PSA. ?

## 2013-06-28 NOTE — Assessment & Plan Note (Signed)
John Black looks into finding a small concentrator so he can travel with ease Current take too heavy to transport

## 2013-06-28 NOTE — Assessment & Plan Note (Signed)
TSH was right at cut off, T3 and T4 added today, has been doing well on meds Will not adjust unless T3 or T4 abnormal

## 2013-06-28 NOTE — Assessment & Plan Note (Signed)
Reviewed labs TG and TC have improved with dietary changes Continue to monitor, statin drug not needed at this time

## 2013-06-28 NOTE — Progress Notes (Signed)
Subjective:    Patient ID: John Black, male    DOB: Sep 05, 1929, 79 y.o.   [name]: [id number]  HPI Subjective:   Patient presents for Medicare Annual/Subsequent preventive examination.   COPD- doing well, request smaller oxygen tank, looked at company [employer], never heard back. Would also like to proceed with pulmonary Rehab as recommended by his lung doctor  Insomnia well controlled on meds, needs refill so he can purchase PSA- due to have PSA done again, history of prostate cancer, urinating well, no concerns    Review Past Medical/Family/Social: - per EMR   Risk Factors  Current exercise habits: walks some Dietary issues discussed: YES  Cardiac risk factors: mild hyperlipidemia  Depression Screen  (Note: if answer to either of the following is "Yes", a more complete depression screening is indicated)  Over the past two weeks, have you felt down, depressed or hopeless? No Over the past two weeks, have you felt little interest or pleasure in doing things? No Have you lost interest or pleasure in daily life? No Do you often feel hopeless? No Do you cry easily over simple problems? No   Activities of Daily Living  In your present state of health, do you have any difficulty performing the following activities?:  Driving? No  Managing money? No  Feeding yourself? No  Getting from bed to chair? No  Climbing a flight of stairs? No  Preparing food and eating?: No  Bathing or showering? No  Getting dressed: No  Getting to the toilet? No  Using the toilet:No  Moving around from place to place: No  In the past year have you fallen or had a near fall?:No  Are you sexually active? No  Do you have more than one partner? No   Hearing Difficulties: No  Do you often ask people to speak up or repeat themselves? No  Do you experience ringing or noises in your ears? No Do you have difficulty understanding soft or whispered voices? No  Do you feel that you have a problem with  memory? No Do you often misplace items? No  Do you feel safe at home? Yes  Cognitive Testing  Alert? Yes Normal Appearance?Yes  Oriented to person? Yes Place? Yes  Time? Yes  Recall of three objects? Yes  Can perform simple calculations? Yes  Displays appropriate judgment?Yes  Can read the correct time from a watch face?Yes   List the Names of Other Physician/Practitioners you currently use: [facility]   Dermatology   Screening Tests / Date Colonoscopy  - UTD                   Zostavax - declines Influenza Vaccine - UTD Tetanus/tdap- unable due to insurance    Assessment:    Annual wellness medicare exam   Plan:    During the course of the visit the patient was educated and counseled about appropriate screening and preventive services including:  Reviewed last pulmonary note, medications refilled  Will look into getting a smaller oxygen concetrator as his current Oxygen is very heavy and cumbersome. Await rest of labs- for PSA, thyorid testing  Diet review for nutrition referral? Yes ____ Not Indicated __x__  Patient Instructions (the written plan) was given to the patient.  Medicare Attestation  I have personally reviewed:  The patient's medical and social history  Their use of alcohol, tobacco or illicit drugs  Their current medications and supplements  The patient's functional ability including ADLs,fall risks, home safety risks, cognitive,  and hearing and visual impairment  Diet and physical activities  Evidence for depression or mood disorders  The patient's weight, height, BMI, and visual acuity have been recorded in the chart. I have made referrals, counseling, and provided education to the patient based on review of the above and I have provided the patient with a written personalized care plan for preventive services.        Review of Systems   GEN- denies fatigue, fever, weight loss,weakness, recent illness HEENT- denies eye drainage, change in  vision, nasal discharge, CVS- denies chest pain, palpitations RESP- denies SOB, cough, wheeze ABD- denies N/V, change in stools, abd pain GU- denies dysuria, hematuria, dribbling, incontinence MSK- denies joint pain, muscle aches, injury Neuro- denies headache, dizziness, syncope, seizure activity      Objective:   Physical Exam GEN- NAD, alert and oriented x3 HEENT- PERRL, EOMI, non injected sclera, pink conjunctiva, MMM, oropharynx clear CVS- RRR, no murmur RESP-CTAB, no wheeze, good air movement EXT- No edema Pulses- Radial 2+        Assessment & Plan:

## 2013-06-29 ENCOUNTER — Other Ambulatory Visit: Payer: Self-pay | Admitting: Pulmonary Disease

## 2013-06-29 DIAGNOSIS — J439 Emphysema, unspecified: Secondary | ICD-10-CM

## 2013-06-30 ENCOUNTER — Encounter: Payer: Self-pay | Admitting: Family Medicine

## 2013-06-30 ENCOUNTER — Ambulatory Visit (INDEPENDENT_AMBULATORY_CARE_PROVIDER_SITE_OTHER): Payer: Medicare Other | Admitting: Family Medicine

## 2013-06-30 VITALS — BP 104/50 | HR 104 | Temp 97.1°F | Resp 24 | Ht 64.0 in | Wt 132.0 lb

## 2013-06-30 DIAGNOSIS — N419 Inflammatory disease of prostate, unspecified: Secondary | ICD-10-CM

## 2013-06-30 DIAGNOSIS — R509 Fever, unspecified: Secondary | ICD-10-CM

## 2013-06-30 LAB — URINALYSIS, ROUTINE W REFLEX MICROSCOPIC
Bilirubin Urine: NEGATIVE
Glucose, UA: NEGATIVE mg/dL
Hgb urine dipstick: NEGATIVE
Ketones, ur: NEGATIVE mg/dL
Leukocytes, UA: NEGATIVE
Nitrite: NEGATIVE
Protein, ur: 100 mg/dL — AB
Specific Gravity, Urine: 1.025 (ref 1.005–1.030)
Urobilinogen, UA: 0.2 mg/dL (ref 0.0–1.0)
pH: 6 (ref 5.0–8.0)

## 2013-06-30 LAB — CBC W/MCH & 3 PART DIFF
HCT: 42.3 % (ref 39.0–52.0)
Hemoglobin: 14 g/dL (ref 13.0–17.0)
Lymphocytes Relative: 8 % — ABNORMAL LOW (ref 12–46)
Lymphs Abs: 1.2 10*3/uL (ref 0.7–4.0)
MCH: 29 pg (ref 26.0–34.0)
MCHC: 33.1 g/dL (ref 30.0–36.0)
MCV: 87.8 fL (ref 78.0–100.0)
Neutro Abs: 12 10*3/uL — ABNORMAL HIGH (ref 1.7–7.7)
Neutrophils Relative %: 84 % — ABNORMAL HIGH (ref 43–77)
Platelets: 237 10*3/uL (ref 150–400)
RBC: 4.82 MIL/uL (ref 4.22–5.81)
RDW: 14.6 % (ref 11.5–15.5)
WBC mixed population %: 8 % (ref 3–18)
WBC mixed population: 1.2 10*3/uL (ref 0.1–1.8)
WBC: 14.4 10*3/uL — ABNORMAL HIGH (ref 4.0–10.5)

## 2013-06-30 LAB — URINALYSIS, MICROSCOPIC ONLY: Crystals: NONE SEEN

## 2013-06-30 LAB — INFLUENZA A AND B
Inflenza A Ag: NEGATIVE
Influenza B Ag: NEGATIVE

## 2013-06-30 MED ORDER — LEVOFLOXACIN 500 MG PO TABS: 500.0000 mg | ORAL_TABLET | Freq: Every day | ORAL | Status: DC

## 2013-06-30 NOTE — Progress Notes (Signed)
Subjective:    Patient ID: John Black, male    DOB: 05/29/1929, 79 y.o.   [name]: [id number]  HPI Patient is an 79 year old white male with a history of prostate cancer who developed a fever to 103 last 24 hours. He reports diffuse myalgias and arthralgias. He also reports mild dysuria and weak urine stream. He denies any cough. He denies any shortness of breath beyond his baseline. He does have severe COPD and emphysema. He denies any travel out of the country. He denies any otalgia, sore throat, sinus pain. He denies any nausea vomiting or diarrhea. He denies any rashes. He denies any recent change in medication.  He has not been bitten by a tick. He is not on any statin medication.  Test today in the office is negative. CBC is significant for leukocytosis with white blood cell count greater than 14.  Microscopic urinalysis is significant for white blood cells and few bacteria. Past Medical History  Diagnosis Date  . Emphysema   . Allergic rhinitis   . Prostate cancer   . Hypothyroid   . Cataract     s/p removal   Past Surgical History  Procedure Laterality Date  . Rotator cuff repair  [id number]    bilateral  . Transurethral resection of prostate  2001    x2  . Tonsillectomy    . Adenoidectomy    . Seed implant for prostate cancer  2000   Current Outpatient Prescriptions on File Prior to Visit  Medication Sig Dispense Refill  . budesonide-formoterol (SYMBICORT) 160-4.5 MCG/ACT inhaler Inhale 2 puffs into the lungs 2 (two) times daily.  1 Inhaler  3  . Calcium-Magnesium-Vitamin D (CITRACAL CALCIUM+D PO) Once a day       . doxazosin (CARDURA) 8 MG tablet Take 1 tablet (8 mg total) by mouth daily.  90 tablet  1  . Ipratropium-Albuterol (COMBIVENT) 20-100 MCG/ACT AERS respimat Inhale 1 puff into the lungs every 6 (six) hours.  3 Inhaler  4  . levothyroxine (SYNTHROID, LEVOTHROID) 75 MCG tablet Take 1 tablet (75 mcg total) by mouth daily.  90 tablet  1  . loratadine (CLARITIN) 5 MG  chewable tablet Chew 5 mg by mouth daily.      . mometasone (NASONEX) 50 MCG/ACT nasal spray Place 2 sprays into the nose daily.      . Multiple Vitamins-Minerals (CVS SPECTRAVITE SENIOR PO) Once a day       . Omega-3 Fatty Acids (FISH OIL PO) Take 1,000 Units by mouth 2 (two) times daily.       . SYMBICORT 160-4.5 MCG/ACT inhaler INHALE 2 PUFFS INTO THE LUNGS 2 (TWO) TIMES DAILY.  1 Inhaler  3  . zolpidem (AMBIEN) 10 MG tablet TAKE 1/2 TO 1 TABLET AT BEDTIME AS NEEDED  30 tablet  3   No current facility-administered medications on file prior to visit.   Allergies  Allergen Reactions  . Morphine     REACTION: sweats   History   Social History  . Marital Status: Married    Spouse Name: N/A    Number of Children: N/A  . Years of Education: N/A   Occupational History  . engineer    Social History Main Topics  . Smoking status: Former Smoker -- 1.50 packs/day for 50 years    Types: Cigarettes    Quit date: 09/17/2008  . Smokeless tobacco: Not on file  . Alcohol Use: Not on file  . Drug Use: Not on file  . Sexual Activity:  Not on file   Other Topics Concern  . Not on file   Social History Narrative  . No narrative on file      Review of Systems  All other systems reviewed and are negative.       Objective:   Physical Exam  Vitals reviewed. Constitutional: He is oriented to person, place, and time. He appears well-developed and well-nourished. No distress.  HENT:  Right Ear: External ear normal.  Left Ear: External ear normal.  Nose: Nose normal.  Mouth/Throat: Oropharynx is clear and moist. No oropharyngeal exudate.  Eyes: Conjunctivae are normal. Pupils are equal, round, and reactive to light. No scleral icterus.  Neck: Normal range of motion. Neck supple. No JVD present. No thyromegaly present.  Cardiovascular: Normal rate, regular rhythm and normal heart sounds.   No murmur heard. Pulmonary/Chest: No respiratory distress. He has decreased breath sounds. He  has wheezes.  Abdominal: Soft. Bowel sounds are normal. He exhibits no distension. There is no tenderness. There is no rebound and no guarding.  Genitourinary: Rectum normal and prostate normal.  Musculoskeletal: He exhibits no edema.  Lymphadenopathy:    He has no cervical adenopathy.  Neurological: He is alert and oriented to person, place, and time. He has normal reflexes. No cranial nerve deficit. Coordination normal.  Skin: Skin is warm. No rash noted. He is not diaphoretic. No erythema.   said is not swollen on exam. It is nontender to palpation.  He does have bleeding external hemorrhoids..No rectal masses. There is no cellulitis or abscess seen on exam.        Assessment & Plan:  Prostatitis - Plan: levofloxacin (LEVAQUIN) 500 MG tablet, Urine culture, CBC w/[facility] & 3 Part Diff, Urinalysis, Routine w reflex microscopic, Influenza a and b  Fever - Plan: Urine culture, CBC w/[facility] & 3 Part Diff, Urinalysis, Routine w reflex microscopic, Influenza a and b  differential diagnosis includes pneumonia, prostatitis, or viral syndrome. The patient's prostatitis given his symptoms and abnormal urine microscopic analysis Levaquin 500 mg by mouth daily for 7 days. To the patient back in one week to recheck his urine. If symptoms worsen I would obtain a chest x-ray and blood cultures immediately. He is to recheck in 48 hours if no better or sooner if worse. Office Visit on 06/30/2013  Component Date Value Range Status  . Source-INFBD 06/30/2013 NASAL   Final  . Inflenza A Ag 06/30/2013 NEG  Negative Final  . Influenza B Ag 06/30/2013 NEG  Negative Final

## 2013-07-01 LAB — URINE CULTURE
Colony Count: NO GROWTH
Organism ID, Bacteria: NO GROWTH

## 2013-07-02 ENCOUNTER — Encounter: Payer: Self-pay | Admitting: Family Medicine

## 2013-07-02 ENCOUNTER — Ambulatory Visit (INDEPENDENT_AMBULATORY_CARE_PROVIDER_SITE_OTHER): Payer: Medicare Other | Admitting: Family Medicine

## 2013-07-02 ENCOUNTER — Encounter (HOSPITAL_COMMUNITY): Payer: Self-pay | Admitting: Emergency Medicine

## 2013-07-02 ENCOUNTER — Emergency Department (HOSPITAL_COMMUNITY): Payer: Medicare Other

## 2013-07-02 ENCOUNTER — Inpatient Hospital Stay (HOSPITAL_COMMUNITY)
Admission: EM | Admit: 2013-07-02 | Discharge: 2013-07-04 | DRG: 871 | Disposition: A | Discharge status: Home / Self Care | Payer: Medicare Other | Attending: Family Medicine | Admitting: Family Medicine

## 2013-07-02 VITALS — BP 140/60 | HR 131 | Temp 98.5°F | Resp 22 | Wt 132.0 lb

## 2013-07-02 DIAGNOSIS — E039 Hypothyroidism, unspecified: Secondary | ICD-10-CM | POA: Diagnosis present

## 2013-07-02 DIAGNOSIS — D638 Anemia in other chronic diseases classified elsewhere: Secondary | ICD-10-CM | POA: Diagnosis present

## 2013-07-02 DIAGNOSIS — Z9981 Dependence on supplemental oxygen: Secondary | ICD-10-CM

## 2013-07-02 DIAGNOSIS — R000 Tachycardia, unspecified: Secondary | ICD-10-CM | POA: Diagnosis present

## 2013-07-02 DIAGNOSIS — Z8042 Family history of malignant neoplasm of prostate: Secondary | ICD-10-CM

## 2013-07-02 DIAGNOSIS — J441 Chronic obstructive pulmonary disease with (acute) exacerbation: Secondary | ICD-10-CM

## 2013-07-02 DIAGNOSIS — D649 Anemia, unspecified: Secondary | ICD-10-CM | POA: Diagnosis present

## 2013-07-02 DIAGNOSIS — E860 Dehydration: Secondary | ICD-10-CM

## 2013-07-02 DIAGNOSIS — J449 Chronic obstructive pulmonary disease, unspecified: Secondary | ICD-10-CM | POA: Diagnosis present

## 2013-07-02 DIAGNOSIS — J9601 Acute respiratory failure with hypoxia: Secondary | ICD-10-CM

## 2013-07-02 DIAGNOSIS — IMO0001 Reserved for inherently not codable concepts without codable children: Secondary | ICD-10-CM | POA: Diagnosis present

## 2013-07-02 DIAGNOSIS — Z8249 Family history of ischemic heart disease and other diseases of the circulatory system: Secondary | ICD-10-CM

## 2013-07-02 DIAGNOSIS — Z8546 Personal history of malignant neoplasm of prostate: Secondary | ICD-10-CM

## 2013-07-02 DIAGNOSIS — N419 Inflammatory disease of prostate, unspecified: Secondary | ICD-10-CM | POA: Diagnosis present

## 2013-07-02 DIAGNOSIS — Z9181 History of falling: Secondary | ICD-10-CM

## 2013-07-02 DIAGNOSIS — J439 Emphysema, unspecified: Secondary | ICD-10-CM

## 2013-07-02 DIAGNOSIS — N400 Enlarged prostate without lower urinary tract symptoms: Secondary | ICD-10-CM | POA: Diagnosis present

## 2013-07-02 DIAGNOSIS — D72829 Elevated white blood cell count, unspecified: Secondary | ICD-10-CM | POA: Diagnosis present

## 2013-07-02 DIAGNOSIS — J438 Other emphysema: Secondary | ICD-10-CM

## 2013-07-02 DIAGNOSIS — J189 Pneumonia, unspecified organism: Secondary | ICD-10-CM | POA: Diagnosis present

## 2013-07-02 DIAGNOSIS — R0902 Hypoxemia: Secondary | ICD-10-CM

## 2013-07-02 DIAGNOSIS — Z87891 Personal history of nicotine dependence: Secondary | ICD-10-CM

## 2013-07-02 DIAGNOSIS — J9620 Acute and chronic respiratory failure, unspecified whether with hypoxia or hypercapnia: Secondary | ICD-10-CM | POA: Diagnosis present

## 2013-07-02 DIAGNOSIS — J9600 Acute respiratory failure, unspecified whether with hypoxia or hypercapnia: Secondary | ICD-10-CM

## 2013-07-02 DIAGNOSIS — I498 Other specified cardiac arrhythmias: Secondary | ICD-10-CM | POA: Diagnosis present

## 2013-07-02 DIAGNOSIS — Z79899 Other long term (current) drug therapy: Secondary | ICD-10-CM

## 2013-07-02 DIAGNOSIS — E871 Hypo-osmolality and hyponatremia: Secondary | ICD-10-CM | POA: Diagnosis present

## 2013-07-02 DIAGNOSIS — A419 Sepsis, unspecified organism: Principal | ICD-10-CM | POA: Diagnosis present

## 2013-07-02 LAB — BASIC METABOLIC PANEL
BUN: 18 mg/dL (ref 6–23)
CO2: 27 mEq/L (ref 19–32)
Calcium: 9.8 mg/dL (ref 8.4–10.5)
Chloride: 97 mEq/L (ref 96–112)
Creatinine, Ser: 0.95 mg/dL (ref 0.50–1.35)
GFR calc Af Amer: 87 mL/min — ABNORMAL LOW (ref >90)
GFR calc non Af Amer: 75 mL/min — ABNORMAL LOW (ref >90)
Glucose, Bld: 120 mg/dL — ABNORMAL HIGH (ref 70–99)
Potassium: 4.4 mEq/L (ref 3.5–5.1)
Sodium: 133 mEq/L — ABNORMAL LOW (ref 135–145)

## 2013-07-02 LAB — CBC WITH DIFFERENTIAL/PLATELET
Basophils Absolute: 0 10*3/uL (ref 0.0–0.1)
Basophils Relative: 0 % (ref 0–1)
Eosinophils Absolute: 0 10*3/uL (ref 0.0–0.7)
Eosinophils Relative: 0 % (ref 0–5)
HCT: 39.6 % (ref 39.0–52.0)
Hemoglobin: 13.7 g/dL (ref 13.0–17.0)
Lymphocytes Relative: 7 % — ABNORMAL LOW (ref 12–46)
Lymphs Abs: 0.9 10*3/uL (ref 0.7–4.0)
MCH: 30 pg (ref 26.0–34.0)
MCHC: 34.6 g/dL (ref 30.0–36.0)
MCV: 86.8 fL (ref 78.0–100.0)
Monocytes Absolute: 1.2 10*3/uL — ABNORMAL HIGH (ref 0.1–1.0)
Monocytes Relative: 9 % (ref 3–12)
Neutro Abs: 10.5 10*3/uL — ABNORMAL HIGH (ref 1.7–7.7)
Neutrophils Relative %: 84 % — ABNORMAL HIGH (ref 43–77)
Platelets: 231 10*3/uL (ref 150–400)
RBC: 4.56 MIL/uL (ref 4.22–5.81)
RDW: 14.4 % (ref 11.5–15.5)
WBC: 12.6 10*3/uL — ABNORMAL HIGH (ref 4.0–10.5)

## 2013-07-02 LAB — URINALYSIS, ROUTINE W REFLEX MICROSCOPIC
Bilirubin Urine: NEGATIVE
Glucose, UA: >1000 mg/dL — AB
Ketones, ur: NEGATIVE mg/dL
Leukocytes, UA: NEGATIVE
Nitrite: NEGATIVE
Protein, ur: 30 mg/dL — AB
Specific Gravity, Urine: >1.03 — ABNORMAL HIGH (ref 1.005–1.030)
Urobilinogen, UA: 0.2 mg/dL (ref 0.0–1.0)
pH: 5.5 (ref 5.0–8.0)

## 2013-07-02 LAB — URINE MICROSCOPIC-ADD ON

## 2013-07-02 IMAGING — CR DG CHEST 1V PORT
1 series · 1 of 1 positions shown · non-contrast
Comparison: [DATE]

CLINICAL DATA: Short of breath with fever. History of prostate
cancer.

EXAM:
PORTABLE CHEST - 1 VIEW

[portable]
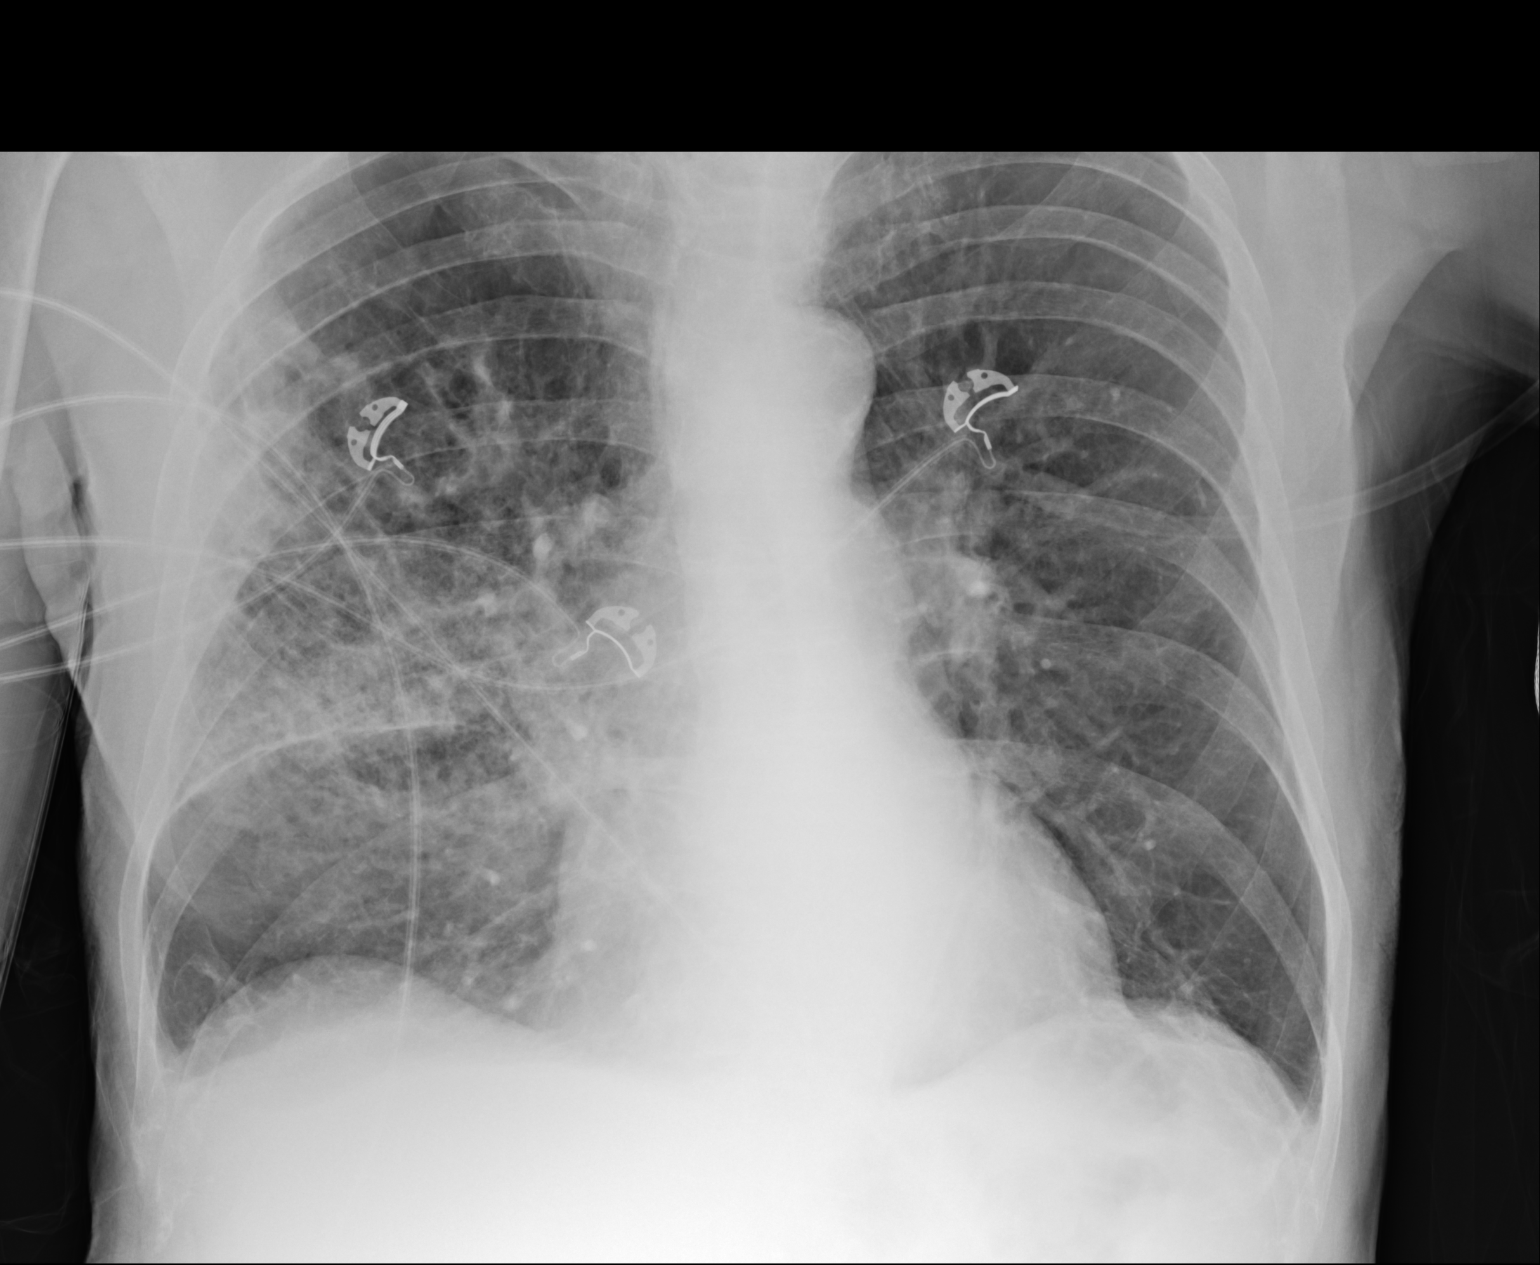

[1 of 1 positions shown; findings below may reference images not displayed]

FINDINGS: Midline trachea. Normal heart size and mediastinal contours. No
pleural effusion or pneumothorax. Right upper and infrahilar
airspace disease. Clear left lung.
IMPRESSION: Right-sided airspace disease, likely pneumonia. Recommend
radiographic follow-up until clearing.

## 2013-07-02 MED ORDER — FLUTICASONE PROPIONATE 50 MCG/ACT NA SUSP
1.0000 | Freq: Every day | NASAL | Status: DC
  Administered 2013-07-03: 1 via NASAL
  Filled 2013-07-02: qty 16

## 2013-07-02 MED ORDER — TRAZODONE HCL 50 MG PO TABS
25.0000 mg | ORAL_TABLET | Freq: Every evening | ORAL | Status: DC | PRN
  Administered 2013-07-02 – 2013-07-03 (×2): 25 mg via ORAL
  Filled 2013-07-02 (×2): qty 1

## 2013-07-02 MED ORDER — ONDANSETRON HCL 4 MG PO TABS: 4.0000 mg | ORAL_TABLET | Freq: Four times a day (QID) | ORAL | Status: DC | PRN

## 2013-07-02 MED ORDER — SODIUM CHLORIDE 0.9 % IV SOLN
INTRAVENOUS | Status: DC
  Administered 2013-07-02: 17:00:00 via INTRAVENOUS

## 2013-07-02 MED ORDER — DEXTROSE 5 % IV SOLN
500.0000 mg | INTRAVENOUS | Status: DC
  Administered 2013-07-02 – 2013-07-03 (×2): 500 mg via INTRAVENOUS
  Filled 2013-07-02 (×4): qty 500

## 2013-07-02 MED ORDER — ALUM & MAG HYDROXIDE-SIMETH 200-200-20 MG/5ML PO SUSP: 30.0000 mL | Freq: Four times a day (QID) | ORAL | Status: DC | PRN

## 2013-07-02 MED ORDER — DOXAZOSIN MESYLATE 8 MG PO TABS: 8.0000 mg | ORAL_TABLET | Freq: Every day | ORAL | Status: DC

## 2013-07-02 MED ORDER — PREDNISONE 20 MG PO TABS
40.0000 mg | ORAL_TABLET | Freq: Every day | ORAL | Status: DC
  Administered 2013-07-03 – 2013-07-04 (×2): 40 mg via ORAL
  Filled 2013-07-02 (×2): qty 2

## 2013-07-02 MED ORDER — ENOXAPARIN SODIUM 40 MG/0.4ML ~~LOC~~ SOLN
40.0000 mg | SUBCUTANEOUS | Status: DC
  Filled 2013-07-02 (×2): qty 0.4

## 2013-07-02 MED ORDER — LEVOTHYROXINE SODIUM 75 MCG PO TABS
75.0000 ug | ORAL_TABLET | Freq: Every day | ORAL | Status: DC
  Administered 2013-07-03 – 2013-07-04 (×2): 75 ug via ORAL
  Filled 2013-07-02 (×2): qty 1

## 2013-07-02 MED ORDER — DOXAZOSIN MESYLATE 8 MG PO TABS
8.0000 mg | ORAL_TABLET | Freq: Every day | ORAL | Status: DC
  Administered 2013-07-02 – 2013-07-03 (×2): 8 mg via ORAL
  Filled 2013-07-02 (×2): qty 1

## 2013-07-02 MED ORDER — LEVALBUTEROL HCL 0.63 MG/3ML IN NEBU
0.6300 mg | INHALATION_SOLUTION | Freq: Four times a day (QID) | RESPIRATORY_TRACT | Status: DC | PRN
  Filled 2013-07-02: qty 3

## 2013-07-02 MED ORDER — METHYLPREDNISOLONE SODIUM SUCC 40 MG IJ SOLR
40.0000 mg | Freq: Four times a day (QID) | INTRAMUSCULAR | Status: DC
  Administered 2013-07-02: 40 mg via INTRAVENOUS
  Filled 2013-07-02: qty 1

## 2013-07-02 MED ORDER — ALBUTEROL SULFATE (5 MG/ML) 0.5% IN NEBU: 5.0000 mg | INHALATION_SOLUTION | RESPIRATORY_TRACT | Status: DC | PRN

## 2013-07-02 MED ORDER — METHYLPREDNISOLONE SODIUM SUCC 125 MG IJ SOLR
125.0000 mg | Freq: Once | INTRAMUSCULAR | Status: AC
  Administered 2013-07-02: 125 mg via INTRAVENOUS
  Filled 2013-07-02: qty 2

## 2013-07-02 MED ORDER — LEVALBUTEROL HCL 0.63 MG/3ML IN NEBU
0.6300 mg | INHALATION_SOLUTION | Freq: Four times a day (QID) | RESPIRATORY_TRACT | Status: DC
  Administered 2013-07-02: 0.63 mg via RESPIRATORY_TRACT

## 2013-07-02 MED ORDER — ACETAMINOPHEN 650 MG RE SUPP: 650.0000 mg | Freq: Four times a day (QID) | RECTAL | Status: DC | PRN

## 2013-07-02 MED ORDER — ACETAMINOPHEN 325 MG PO TABS
650.0000 mg | ORAL_TABLET | Freq: Four times a day (QID) | ORAL | Status: DC | PRN
  Administered 2013-07-03: 650 mg via ORAL
  Filled 2013-07-02: qty 2

## 2013-07-02 MED ORDER — ALBUTEROL (5 MG/ML) CONTINUOUS INHALATION SOLN
10.0000 mg/h | INHALATION_SOLUTION | Freq: Once | RESPIRATORY_TRACT | Status: AC
  Administered 2013-07-02: 10 mg/h via RESPIRATORY_TRACT
  Filled 2013-07-02: qty 20

## 2013-07-02 MED ORDER — SODIUM CHLORIDE 0.9 % IJ SOLN
3.0000 mL | Freq: Two times a day (BID) | INTRAMUSCULAR | Status: DC
  Administered 2013-07-02 – 2013-07-03 (×2): 3 mL via INTRAVENOUS

## 2013-07-02 MED ORDER — LORATADINE 10 MG PO TABS
5.0000 mg | ORAL_TABLET | Freq: Every day | ORAL | Status: DC
  Administered 2013-07-03: 5 mg via ORAL
  Filled 2013-07-02: qty 1

## 2013-07-02 MED ORDER — IPRATROPIUM BROMIDE 0.02 % IN SOLN
0.5000 mg | Freq: Once | RESPIRATORY_TRACT | Status: AC
  Administered 2013-07-02: 0.5 mg via RESPIRATORY_TRACT
  Filled 2013-07-02: qty 2.5

## 2013-07-02 MED ORDER — ONDANSETRON HCL 4 MG/2ML IJ SOLN: 4.0000 mg | Freq: Four times a day (QID) | INTRAMUSCULAR | Status: DC | PRN

## 2013-07-02 MED ORDER — DEXTROSE 5 % IV SOLN
1.0000 g | INTRAVENOUS | Status: DC
  Administered 2013-07-02 – 2013-07-03 (×2): 1 g via INTRAVENOUS
  Filled 2013-07-02 (×4): qty 10

## 2013-07-02 MED ORDER — LEVOFLOXACIN IN D5W 500 MG/100ML IV SOLN
500.0000 mg | INTRAVENOUS | Status: DC
  Administered 2013-07-02: 500 mg via INTRAVENOUS
  Filled 2013-07-02 (×2): qty 100

## 2013-07-02 MED ORDER — GUAIFENESIN-DM 100-10 MG/5ML PO SYRP: 5.0000 mL | ORAL_SOLUTION | ORAL | Status: DC | PRN

## 2013-07-02 NOTE — H&P (Signed)
Patient seen, independently examined and chart reviewed. I agree with exam, assessment and plan discussed with Toya Smothers, NP.  Very pleasant 79 year old man with history of chronic respiratory failure, COPD who for the last several days to week has had increasing shortness of breath and some muscle aches. Several days prior to admission he developed fever and had also noticed some mild dysuria and greater difficulty urinating. He is seen by his primary care physician and placed on empiric Levaquin for possible pneumonia, prostatitis or viral syndrome. On reevaluation 10/16 he was noted to be hypoxic with increased respiratory symptoms and he was referred to the emergency department where he was found to have acute on chronic respiratory failure and right-sided pneumonia. There was also report of confusion and a fall at home. No syncope. The patient reports he first took tramadol last night and then Ambien and attributes his fall and confusion this morning to these 2 medications.  He appears calm and comfortable, sitting in chair reading a book. Respirations are labored but no acute distress. Lung fields are clear with good air movement. No frank wheezes, rales or rhonchi. Moderate increased respiratory effort but he is able to stand and walk without assistance. He is able to speak in full sentences and is in no acute distress. EKG showed sinus tachycardia with no acute changes. Influenza screen 10/14 was negative.  He has only had 2 doses of Levaquin as an outpatient, this point would not consider this a treatment failure and will treat as community-acquired pneumonia with Rocephin and Zithromax. Leukocytosis actually improved from 2 days ago.  Given his tachycardia, hypoxia, tachypnea suspect early sepsis secondary to pneumonia. His COPD appears to be stable, change to oral prednisone in the morning.  Brendia Sacks, MD [facility] [phone]

## 2013-07-02 NOTE — ED Provider Notes (Signed)
CSN: [id number]     Arrival date & time 07/02/13  1146 History  This chart was scribed for Ward Givens, MD, by Yevette Edwards, ED Scribe. This patient was seen in room APA10/APA10 and the patient's care was started at 12:49 PM.  First MD Initiated Contact with Patient 07/02/13 1241     Chief Complaint  Patient presents with  . Shortness of Breath    HPI HPI Comments: John Black is a 79 y.o. male who presents to the Emergency Department complaining of gradually-worsening SOB which began five days ago. The pt has also experienced generalized myalgia, a undocumented fever and chills, clear rhinorrhea, a decreased appetite and thirst, and lower chest side pain that is worse on the right than the left as associated symptoms. The pt denies a cough, sore throat, nausea, emesis, or diarrhea. At baseline, he states that ambulation increases his SOB and he has oxygen at home which he can use as needed. He has not used it this week, though he thinks that he should have. The pt reports that he wears oxygen only if he is doing a strenuous activity. He does not use oxygen at night.He was seen by his PCP and started on Levaquin this week and today will be the third day he has had it (takes at 2 pm).   According to the pt's wife and daughter, he fell this morning in the bathroom about 5 am  and his daughter reports that he appeared very weak, disoriented, white, had a very dry mouth, and was very somnolent. She did not think his lips were cyanotic. They gave the pt oxygen. He ate breakfast after the fall, but he kept dropping his fork into the waffle. He saw Dr. Tanya Nones at [facility] today and had a pulse ox in the low 80's. He was sent to the ED.   Marcelyn Bruins at [place] is the pt's pulmonary specialist; the pt has COPD.  The pt uses Symbicort and combivent  PCP Dr Jeanice Lim  Past Medical History  Diagnosis Date  . Emphysema   . Allergic rhinitis   . Prostate cancer   . Hypothyroid   . Cataract      s/p removal   Past Surgical History  Procedure Laterality Date  . Rotator cuff repair  [id number]    bilateral  . Transurethral resection of prostate  2001    x2  . Tonsillectomy    . Adenoidectomy    . Seed implant for prostate cancer  2000   Family History  Problem Relation Age of Onset  . Heart disease Mother   . Prostate cancer Father    History  Substance Use Topics  . Smoking status: Former Smoker -- 1.50 packs/day for 50 years    Types: Cigarettes    Quit date: 09/17/2008  . Smokeless tobacco: Not on file  . Alcohol Use: 0.6 oz/week    1 Glasses of wine per week     Comment: each evening  lives at home Lives with wife Oxygen PRN  Review of Systems  Constitutional: Positive for fever, chills and appetite change.  HENT: Positive for rhinorrhea.   Respiratory: Positive for shortness of breath. Negative for cough.   Cardiovascular: Positive for chest pain.  Gastrointestinal: Positive for abdominal pain. Negative for nausea, vomiting and diarrhea.  Musculoskeletal: Positive for myalgias.  Skin: Positive for pallor.  Neurological: Positive for syncope.    Allergies  Morphine  Home Medications   Current Outpatient Rx  Name  Route  Sig  Dispense  Refill  . budesonide-formoterol (SYMBICORT) 160-4.5 MCG/ACT inhaler   Inhalation   Inhale 2 puffs into the lungs 2 (two) times daily.   1 Inhaler   3   . Calcium-Magnesium-Vitamin D (CITRACAL CALCIUM+D PO)      Once a day          . doxazosin (CARDURA) 8 MG tablet   Oral   Take 1 tablet (8 mg total) by mouth daily.   90 tablet   1   . Ipratropium-Albuterol (COMBIVENT) 20-100 MCG/ACT AERS respimat   Inhalation   Inhale 1 puff into the lungs every 6 (six) hours.   3 Inhaler   4   . levofloxacin (LEVAQUIN) 500 MG tablet   Oral   Take 1 tablet (500 mg total) by mouth daily.   7 tablet   0   . levothyroxine (SYNTHROID, LEVOTHROID) 75 MCG tablet   Oral   Take 1 tablet (75 mcg total) by mouth  daily.   90 tablet   1   . loratadine (CLARITIN) 5 MG chewable tablet   Oral   Chew 5 mg by mouth daily.         . mometasone (NASONEX) 50 MCG/ACT nasal spray   Nasal   Place 2 sprays into the nose daily.         . Multiple Vitamins-Minerals ([employer] SPECTRAVITE SENIOR PO)      Once a day          . Omega-3 Fatty Acids (FISH OIL PO)   Oral   Take 1,000 Units by mouth 2 (two) times daily.          . SYMBICORT 160-4.5 MCG/ACT inhaler      INHALE 2 PUFFS INTO THE LUNGS 2 (TWO) TIMES DAILY.   1 Inhaler   3   . zolpidem (AMBIEN) 10 MG tablet      TAKE 1/2 TO 1 TABLET AT BEDTIME AS NEEDED   30 tablet   3    BP 121/78  Pulse 126  Temp(Src) 99.3 F (37.4 C) (Oral)  Resp 30  Ht 5\' 6"  (1.676 m)  Wt 132 lb (59.875 kg)  BMI 21.32 kg/m2  SpO2 92%  Vital signs normal except tachycardia, low grade temp   Physical Exam  Nursing note and vitals reviewed. Constitutional: He is oriented to person, place, and time. No distress.  Frail, elderly male. Pleasant  HENT:  Head: Normocephalic and atraumatic.  Right Ear: External ear normal.  Left Ear: External ear normal.  Dry mucus membranes.  Eyes: Conjunctivae and EOM are normal.  Myotic pupils.  Neck: Normal range of motion. Neck supple. No tracheal deviation present.  Cardiovascular: Regular rhythm, normal heart sounds and intact distal pulses.  Tachycardia present.   Pulmonary/Chest: Accessory muscle usage present. Tachypnea noted. He is in respiratory distress. He has decreased breath sounds.  Retractions and abdominal breathing. Appears tachypnic.  Diminished air sounds.   Musculoskeletal: Normal range of motion.  Neurological: He is alert and oriented to person, place, and time.  Skin: Skin is warm and dry. There is pallor.  Psychiatric: He has a normal mood and affect. His behavior is normal.    ED Course  Procedures (including critical care time)  Medications  ipratropium (ATROVENT) nebulizer solution 0.5  mg (0.5 mg Nebulization Given 07/02/13 1338)  albuterol (PROVENTIL,VENTOLIN) solution continuous neb (10 mg/hr Nebulization Given 07/02/13 1338)  methylPREDNISolone sodium succinate (SOLU-MEDROL) 125 mg/2 mL injection 125 mg (125 mg Intravenous  Given 07/02/13 1317)  levaquin 500 mg IV ordered.   DIAGNOSTIC STUDIES: Oxygen Saturation is 92% on room air, normal by my interpretation.    COORDINATION OF CARE:  12:58 PM - Discussed treatment plan with patient, and the patient agreed to the plan.  Informed the pt and his family that he has pneumonia to his right lung and that he may be admitted to the hospital.    14:40 Dr Irene Limbo, admit to tele, team 2  Labs Review Results for orders placed during the hospital encounter of 07/02/13  CBC WITH DIFFERENTIAL      Result Value Range   WBC 12.6 (*) 4.0 - 10.5 K/uL   RBC 4.56  4.22 - 5.81 MIL/uL   Hemoglobin 13.7  13.0 - 17.0 g/dL   HCT 16.1  09.6 - 04.5 %   MCV 86.8  78.0 - 100.0 fL   MCH 30.0  26.0 - 34.0 pg   MCHC 34.6  30.0 - 36.0 g/dL   RDW 40.9  81.1 - 91.4 %   Platelets 231  150 - 400 K/uL   Neutrophils Relative % 84 (*) 43 - 77 %   Neutro Abs 10.5 (*) 1.7 - 7.7 K/uL   Lymphocytes Relative 7 (*) 12 - 46 %   Lymphs Abs 0.9  0.7 - 4.0 K/uL   Monocytes Relative 9  3 - 12 %   Monocytes Absolute 1.2 (*) 0.1 - 1.0 K/uL   Eosinophils Relative 0  0 - 5 %   Eosinophils Absolute 0.0  0.0 - 0.7 K/uL   Basophils Relative 0  0 - 1 %   Basophils Absolute 0.0  0.0 - 0.1 K/uL  BASIC METABOLIC PANEL      Result Value Range   Sodium 133 (*) 135 - 145 mEq/L   Potassium 4.4  3.5 - 5.1 mEq/L   Chloride 97  96 - 112 mEq/L   CO2 27  19 - 32 mEq/L   Glucose, Bld 120 (*) 70 - 99 mg/dL   BUN 18  6 - 23 mg/dL   Creatinine, Ser 7.82  0.50 - 1.35 mg/dL   Calcium 9.8  8.4 - 95.6 mg/dL   GFR calc non Af Amer 75 (*) >90 mL/min   GFR calc Af Amer 87 (*) >90 mL/min   Laboratory interpretation all normal except mild hyponatremia,  leukocytosis   Imaging Review Dg Chest Portable 1 View  07/02/2013   CLINICAL DATA:  Short of breath with fever. History of prostate cancer.  EXAM: PORTABLE CHEST - 1 VIEW  COMPARISON:  10/02/2012  FINDINGS: Midline trachea. Normal heart size and mediastinal contours. No pleural effusion or pneumothorax. Right upper and infrahilar airspace disease. Clear left lung.  IMPRESSION: Right-sided airspace disease, likely pneumonia. Recommend radiographic follow-up until clearing.   Electronically Signed   By: Jeronimo Greaves M.D.   On: 07/02/2013 12:27      MDM   1. CAP (community acquired pneumonia)   2. COPD with acute exacerbation   3. Hypoxia      I personally performed the services described in this documentation, which was scribed in my presence. The recorded information has been reviewed and considered.     Ward Givens, MD 07/02/13 [phone]

## 2013-07-02 NOTE — ED Notes (Addendum)
Began having general body aches and fevers Monday w/progressive SOB over course of week.  Saw PMD Tuesday and started Levoquin 500mg  daily.  Symptoms have progressively worsened.  Today got up and fell in BR.  Daughter states upon her arrival, patient was very weak, disoriented.  Took Symbicort x 2 puffs today and combivent x 1 puff. Lung fields very diminished w/fine rales and scattered exp. Wheezes in bases posteriorly.

## 2013-07-02 NOTE — Progress Notes (Signed)
Subjective:    Patient ID: John Black, male    DOB: Nov 09, 1928, 79 y.o.   [name]: [id number]  HPI 06/30/13 Patient is an 79 year old white male with a history of prostate cancer who developed a fever to 103 last 24 hours. He reports diffuse myalgias and arthralgias. He also reports mild dysuria and weak urine stream. He denies any cough. He denies any shortness of breath beyond his baseline. He does have severe COPD and emphysema. He denies any travel out of the country. He denies any otalgia, sore throat, sinus pain. He denies any nausea vomiting or diarrhea. He denies any rashes. He denies any recent change in medication.  He has not been bitten by a tick. He is not on any statin medication.  Flu test today in the office is negative. CBC is significant for leukocytosis with white blood cell count greater than 14.  Microscopic urinalysis is significant for white blood cells and few bacteria.  At that time, my plan was:   Prostatitis:differential diagnosis includes pneumonia, prostatitis, or viral syndrome. I believe the patient has prostatitis given his symptoms and abnormal urine microscopic analysis. Start Levaquin 500 mg by mouth daily for 7 days. Bring the patient back in one week to recheck his urine. If symptoms worsen I would obtain a chest x-ray and blood cultures immediately. He is to recheck in 48 hours if no better or sooner if worse.  07/02/13 Over the last 48 hours, the patient developed respiratory symptoms.  Beginning last night, the patient became short of breath, with right lower pleurisy.  He is now hypoxic and working hard to breathe.  He fell last night in bathroom.  He was delirious this am.  He continues to run a low-grade fever.  Office Visit on 06/30/2013  Component Date Value Range Status  . Colony Count 06/30/2013 NO GROWTH   Final  . Organism ID, Bacteria 06/30/2013 NO GROWTH   Final  . WBC 06/30/2013 14.4* 4.0 - 10.5 K/uL Final  . RBC 06/30/2013 4.82  4.22 - 5.81  MIL/uL Final  . Hemoglobin 06/30/2013 14.0  13.0 - 17.0 g/dL Final  . HCT 14/78/2956 42.3  39.0 - 52.0 % Final  . MCV 06/30/2013 87.8  78.0 - 100.0 fL Final  . MCH 06/30/2013 29.0  26.0 - 34.0 pg Final  . MCHC 06/30/2013 33.1  30.0 - 36.0 g/dL Final  . RDW 21/30/8657 14.6  11.5 - 15.5 % Final  . Platelets 06/30/2013 237  150 - 400 K/uL Final  . Neutrophils Relative % 06/30/2013 84* 43 - 77 % Final  . Neutro Abs 06/30/2013 12.0* 1.7 - 7.7 K/uL Final  . Lymphocytes Relative 06/30/2013 8* 12 - 46 % Final  . Lymphs Abs 06/30/2013 1.2  0.7 - 4.0 K/uL Final  . WBC mixed population % 06/30/2013 8  3 - 18 % Final  . WBC mixed population 06/30/2013 1.2  0.1 - 1.8 K/uL Final   Comment: Mixed Population includes all WBC differential cells not classified                          as neutrophils or lymphocytes.  . Color, Urine 06/30/2013 AMBER* YELLOW Final   Biochemicals may be affected by the color of the urine.  Marland Kitchen APPearance 06/30/2013 CLEAR  CLEAR Final  . Specific Gravity, Urine 06/30/2013 1.025  1.005 - 1.030 Final  . pH 06/30/2013 6.0  5.0 - 8.0 Final  . Glucose, UA 06/30/2013 NEG  NEG mg/dL Final  . Bilirubin Urine 06/30/2013 NEG  NEG Final  . Ketones, ur 06/30/2013 NEG  NEG mg/dL Final  . Hgb urine dipstick 06/30/2013 NEG  NEG Final  . Protein, ur 06/30/2013 100* NEG mg/dL Final  . Urobilinogen, UA 06/30/2013 0.2  0.0 - 1.0 mg/dL Final  . Nitrite 16/06/9603 NEG  NEG Final  . Leukocytes, UA 06/30/2013 NEG  NEG Final  . Source-INFBD 06/30/2013 NASAL   Final  . Inflenza A Ag 06/30/2013 NEG  Negative Final  . Influenza B Ag 06/30/2013 NEG  Negative Final  . Squamous Epithelial / LPF 06/30/2013 RARE  RARE Final  . Crystals 06/30/2013 NONE SEEN  NONE SEEN Final  . Casts 06/30/2013 Hyaline casts noted  NONE SEEN Final  . WBC, UA 06/30/2013 3-6* <3 WBC/hpf Final  . RBC / HPF 06/30/2013 0-2  <3 RBC/hpf Final  . Bacteria, UA 06/30/2013 FEW* RARE Final  . Daryll Drown 06/30/2013 MOD MUCUS    Final     Past Medical History  Diagnosis Date  . Emphysema   . Allergic rhinitis   . Prostate cancer   . Hypothyroid   . Cataract     s/p removal   Past Surgical History  Procedure Laterality Date  . Rotator cuff repair  [id number]    bilateral  . Transurethral resection of prostate  2001    x2  . Tonsillectomy    . Adenoidectomy    . Seed implant for prostate cancer  2000   Current Outpatient Prescriptions on File Prior to Visit  Medication Sig Dispense Refill  . budesonide-formoterol (SYMBICORT) 160-4.5 MCG/ACT inhaler Inhale 2 puffs into the lungs 2 (two) times daily.  1 Inhaler  3  . Calcium-Magnesium-Vitamin D (CITRACAL CALCIUM+D PO) Once a day       . doxazosin (CARDURA) 8 MG tablet Take 1 tablet (8 mg total) by mouth daily.  90 tablet  1  . Ipratropium-Albuterol (COMBIVENT) 20-100 MCG/ACT AERS respimat Inhale 1 puff into the lungs every 6 (six) hours.  3 Inhaler  4  . levofloxacin (LEVAQUIN) 500 MG tablet Take 1 tablet (500 mg total) by mouth daily.  7 tablet  0  . levothyroxine (SYNTHROID, LEVOTHROID) 75 MCG tablet Take 1 tablet (75 mcg total) by mouth daily.  90 tablet  1  . loratadine (CLARITIN) 5 MG chewable tablet Chew 5 mg by mouth daily.      . mometasone (NASONEX) 50 MCG/ACT nasal spray Place 2 sprays into the nose daily.      . Multiple Vitamins-Minerals ([employer] SPECTRAVITE SENIOR PO) Once a day       . Omega-3 Fatty Acids (FISH OIL PO) Take 1,000 Units by mouth 2 (two) times daily.       . SYMBICORT 160-4.5 MCG/ACT inhaler INHALE 2 PUFFS INTO THE LUNGS 2 (TWO) TIMES DAILY.  1 Inhaler  3  . zolpidem (AMBIEN) 10 MG tablet TAKE 1/2 TO 1 TABLET AT BEDTIME AS NEEDED  30 tablet  3   No current facility-administered medications on file prior to visit.   Allergies  Allergen Reactions  . Morphine     REACTION: sweats   History   Social History  . Marital Status: Married    Spouse Name: N/A    Number of Children: N/A  . Years of Education: N/A   Occupational  History  . engineer    Social History Main Topics  . Smoking status: Former Smoker -- 1.50 packs/day for 50 years    Types:  Cigarettes    Quit date: 09/17/2008  . Smokeless tobacco: Not on file  . Alcohol Use: Not on file  . Drug Use: Not on file  . Sexual Activity: Not on file   Other Topics Concern  . Not on file   Social History Narrative  . No narrative on file      Review of Systems  All other systems reviewed and are negative.       Objective:   Physical Exam  Vitals reviewed. Constitutional: He is oriented to person, place, and time. He appears well-developed and well-nourished. No distress.  HENT:  Right Ear: External ear normal.  Left Ear: External ear normal.  Nose: Nose normal.  Mouth/Throat: Oropharynx is clear and moist. No oropharyngeal exudate.  Eyes: Conjunctivae are normal. Pupils are equal, round, and reactive to light. No scleral icterus.  Neck: Normal range of motion. Neck supple. No JVD present. No thyromegaly present.  Cardiovascular: Normal rate, regular rhythm and normal heart sounds.   No murmur heard. Pulmonary/Chest: Accessory muscle usage present. Tachypnea noted. No respiratory distress. He has decreased breath sounds in the right upper field, the right middle field, the right lower field, the left upper field, the left middle field and the left lower field. He has wheezes in the right lower field and the left lower field. He has rhonchi in the right lower field.  Abdominal: Soft. Bowel sounds are normal. He exhibits no distension. There is no tenderness. There is no rebound and no guarding.  Musculoskeletal: He exhibits no edema.  Lymphadenopathy:    He has no cervical adenopathy.  Neurological: He is alert and oriented to person, place, and time. He has normal reflexes. No cranial nerve deficit. Coordination normal.  Skin: Skin is warm. No rash noted. He is not diaphoretic. No erythema.          Assessment & Plan:  1. COPD  exacerbation  2. Acute respiratory failure with hypoxia  3. Dehydration  Patient is clinically dehydrated.  He is hypoxic and has increased work of breathing.  I started the patient on 6 L via a non rebreather mask and was able to obtain an spo2 of 88%.  I sent the patient immediately to the emergency room.  I believe he needs IV fluids, high dose IV steroids, frequent nebulizer treatments, a chest x-ray, and IV antibiotics.  He is clearly unstable to be at home.  The family will transport the patient  to the emergency room immediately. Emergency room was notified.

## 2013-07-02 NOTE — H&P (Signed)
[facility] History and Physical  John Black WUJ:[id number] DOB: 20-Mar-1929 DOA: 07/02/2013  Referring physician:  PCP: Milinda Antis, MD  Specialists:   Chief Complaint: shortness of breath  HPI: John Black is a delightful 79 y.o. male with a past medical history that includes emphysema, chronic respiratory failure, prostate cancer, thyroid disease, chronic bronchitis, presents to the emergency department from his primary care provider's office with the chief complaint of worsening shortness of breath. Information is obtained from the patient and his wife and daughter at the bedside. He reports that 3 days ago he started feeling "achy all over". He developed some mild weakness. He went to his primary care provider and was prescribed Levaquin. He subsequently developed a fever and his oral temperature was 103. Associated symptoms include anorexia and generalized weakness. He denies any chest pain palpitations cough. He does report some mild abdominal bloating after his first dose of Levaquin but denies abdominal pain nausea vomiting diarrhea constipation melena or hematochezia. Patient has oxygen at home but uses it only intermittently. He states that in spite of his worsening shortness of breath he has not started wearing his oxygen. This morning he awakened went to the bathroom and fell. He denies loss of consciousness or hitting his head. He states his legs became weak and gave way. His wife reports that after the fall he was somewhat disoriented and having difficulty with communication. He then put on his oxygen and this was gradually improved. He went to his primary care provider this morning and his oxygen saturation level was reportedly 80. He was sent to the emergency room. In the emergency room chest x-ray yields Right-sided airspace disease, likely pneumonia. Lab work significant for sodium of 133 white count of 12.6. No signs significant for a heart rate of 140 respiration  rate 26 and oxygen saturation level of 87% on 4 L nasal cannula. In the emergency department he received oxygen, nebulizers and Solu-Medrol. We are asked to admit for further evaluation and treatment   Review of Systems: 10 point review of systems complete in all systems are negative except as indicated in the history of present illness  Past Medical History  Diagnosis Date  . Emphysema   . Allergic rhinitis   . Prostate cancer   . Hypothyroid   . Cataract     s/p removal   Past Surgical History  Procedure Laterality Date  . Rotator cuff repair  [id number]    bilateral  . Transurethral resection of prostate  2001    x2  . Tonsillectomy    . Adenoidectomy    . Seed implant for prostate cancer  2000   Social History:  reports that he quit smoking about 4 years ago. His smoking use included Cigarettes. He has a 75 pack-year smoking history. He does not have any smokeless tobacco history on file. He reports that he drinks about 0.6 ounces of alcohol per week. He reports that he does not use illicit drugs. Patient is a former smoker he quit 4 years ago. He has one glass of wine daily. He is married and lives with his wife. He is a retired Art gallery manager. He is independent with his ADLs  Allergies  Allergen Reactions  . Morphine     REACTION: sweats    Family History  Problem Relation Age of Onset  . Heart disease Mother   . Prostate cancer Father    Prior to Admission medications   Medication Sig Start Date End Date Taking? Authorizing Provider  Calcium-Magnesium-Vitamin D (CITRACAL CALCIUM+D PO) Once a day    Yes Historical Provider, MD  doxazosin (CARDURA) 8 MG tablet Take 8 mg by mouth at bedtime. 06/02/13  Yes Salley Scarlet, MD  Ipratropium-Albuterol (COMBIVENT) 20-100 MCG/ACT AERS respimat Inhale 1 puff into the lungs every 6 (six) hours. 06/26/13  Yes Salley Scarlet, MD  levofloxacin (LEVAQUIN) 500 MG tablet Take 1 tablet (500 mg total) by mouth daily. 06/30/13  Yes Donita Brooks, MD  levothyroxine (SYNTHROID, LEVOTHROID) 75 MCG tablet Take 1 tablet (75 mcg total) by mouth daily. 04/16/13  Yes Salley Scarlet, MD  loratadine (CLARITIN) 5 MG chewable tablet Chew 5 mg by mouth daily.   Yes Historical Provider, MD  mometasone (NASONEX) 50 MCG/ACT nasal spray Place 2 sprays into the nose daily.   Yes Historical Provider, MD  Multiple Vitamins-Minerals ([employer] SPECTRAVITE SENIOR PO) Once a day    Yes Historical Provider, MD  Omega-3 Fatty Acids (FISH OIL PO) Take 1,000 Units by mouth daily.    Yes Historical Provider, MD  SYMBICORT 160-4.5 MCG/ACT inhaler INHALE 2 PUFFS INTO THE LUNGS 2 (TWO) TIMES DAILY. 05/15/13  Yes Barbaraann Share, MD  zolpidem (AMBIEN) 10 MG tablet TAKE 1/2 TO 1 TABLET AT BEDTIME AS NEEDED 06/26/13  Yes Salley Scarlet, MD   Physical Exam: Filed Vitals:   07/02/13 1534  BP: 146/72  Pulse: 147  Temp: 98.8 F (37.1 C)  Resp: 24     General:  Somewhat thin no acute distress  Eyes: PERRLA, EOMI, no scleral icterus  ENT: Ears clear nose with small amount of clear drainage oropharynx without erythema or exudate. Mucous membranes of his mouth are slightly pale slightly dry  Neck: Supple no JVD full range of motion no lymphadenopathy  Cardiovascular: Tachycardic but regular no murmur no gallop no rub. No lower extremity edema  Respiratory: Mild to moderate increased work of breathing with conversation. Patient unable to complete a sentence. Using abdominal accessory muscles and pursed lip breathing. Breath sounds are somewhat distant but fair air flow. I hear no wheezing no rhonchi no crackles  Abdomen: Round soft positive bowel sounds. Nontender to palpation no mass organomegaly  Skin: No rashes or lesions noted skin is thin appearing  Musculoskeletal: Fair muscle time joints without erythema or swelling nontender to palpation  Psychiatric: Pleasant calm cooperative  Neurologic: Cranial nerves II through XII grossly intact speech clear  facial symmetry  Labs on Admission:  Basic Metabolic Panel:  Recent Labs Lab 06/26/13 1211 07/02/13 1223  NA 141 133*  K 5.5* 4.4  CL 104 97  CO2 25 27  GLUCOSE 77 120*  BUN 19 18  CREATININE 0.96 0.95  CALCIUM 10.0 9.8   Liver Function Tests:  Recent Labs Lab 06/26/13 1211  AST 26  ALT 22  ALKPHOS 53  BILITOT 0.6  PROT 6.5  ALBUMIN 4.5   No results found for this basename: LIPASE, AMYLASE,  in the last 168 hours No results found for this basename: AMMONIA,  in the last 168 hours CBC:  Recent Labs Lab 06/26/13 1211 06/30/13 1206 07/02/13 1223  WBC 6.8 14.4* 12.6*  NEUTROABS 3.8 12.0* 10.5*  HGB 14.7 14.0 13.7  HCT 44.1 42.3 39.6  MCV 86.0 87.8 86.8  PLT 266 237 231   Cardiac Enzymes: No results found for this basename: CKTOTAL, CKMB, CKMBINDEX, TROPONINI,  in the last 168 hours  BNP (last 3 results) No results found for this basename: PROBNP,  in the  last 8760 hours CBG: No results found for this basename: GLUCAP,  in the last 168 hours  Radiological Exams on Admission: Dg Chest Portable 1 View  07/02/2013   CLINICAL DATA:  Short of breath with fever. History of prostate cancer.  EXAM: PORTABLE CHEST - 1 VIEW  COMPARISON:  10/02/2012  FINDINGS: Midline trachea. Normal heart size and mediastinal contours. No pleural effusion or pneumothorax. Right upper and infrahilar airspace disease. Clear left lung.  IMPRESSION: Right-sided airspace disease, likely pneumonia. Recommend radiographic follow-up until clearing.   Electronically Signed   By: Jeronimo Greaves M.D.   On: 07/02/2013 12:27    EKG: Independently reviewed. Pending  Assessment/Plan Principal Problem:   Acute-on-chronic respiratory failure: Likely secondary to pneumonia. Will admit to telemetry. Will provide nebulizers and Solu-Medrol. Will provide oxygen support and monitor her saturation levels closely. Will start Rocephin and azithromycin. Will obtain sputum cultures. Currently patient is  hemodynamically stable and nontoxic appearing Active Problems: Pneumonia: Patient failed outpatient treatment. Has had 2 days of Levaquin. Will provide Rocephin and azithromycin. Will obtain sputum cultures. Will get blood cultures should his temperature spike above 101. Will gently hydrate with IV fluids. Will provide oxygen and monitor her saturation levels closely.    Leukocytosis, unspecified: Secondary to pneumonia. Will also obtain urinalysis for completeness. Patient does complain of some dysuria.    Hyponatremia: Likely related to decreased by mouth intake over the last several days. Will gently hydrate with IV fluids. Will recheck in the a.m.    Sinus tachycardia: Likely related to nebulizers. Will change this to Xopenex. Is hemodynamically stable. He is currently afebrile and nontoxic appearing. Monitor on telemetry. Get an EKG. Denies any chest pain or palpitations.    COPD (chronic obstructive pulmonary disease) with emphysema: See #1 and #2. Will continue Solu-Medrol and nebulizers. Monitor oxygen saturation levels closely.    Hypothyroidism; will check a TSH. Continue his home medications    BPH (benign prostatic hyperplasia): Baseline bleeding is slow to start with slow stream. Patient does report some recent dysuria. Denies hematuria. PCP note indicates some concern for prostatitis. Will get a urinalysis.     Code Status: full Family Communication: Wife and daughter at bedside Disposition Plan: Home when ready  Time spent: 78 minutes  [facility] M [facility]   If 7PM-7AM, please contact night-coverage [email or web address] Password [email or web address] 07/02/2013, 4:21 PM

## 2013-07-03 DIAGNOSIS — D649 Anemia, unspecified: Secondary | ICD-10-CM | POA: Diagnosis present

## 2013-07-03 LAB — BASIC METABOLIC PANEL
BUN: 18 mg/dL (ref 6–23)
CO2: 26 mEq/L (ref 19–32)
Calcium: 9.6 mg/dL (ref 8.4–10.5)
Chloride: 99 mEq/L (ref 96–112)
Creatinine, Ser: 0.83 mg/dL (ref 0.50–1.35)
GFR calc Af Amer: >90 mL/min (ref >90)
GFR calc non Af Amer: 80 mL/min — ABNORMAL LOW (ref >90)
Glucose, Bld: 176 mg/dL — ABNORMAL HIGH (ref 70–99)
Potassium: 4.9 mEq/L (ref 3.5–5.1)
Sodium: 135 mEq/L (ref 135–145)

## 2013-07-03 LAB — RETICULOCYTES
RBC.: 4.14 MIL/uL — ABNORMAL LOW (ref 4.22–5.81)
Retic Count, Absolute: 37.3 10*3/uL (ref 19.0–186.0)
Retic Ct Pct: 0.9 % (ref 0.4–3.1)

## 2013-07-03 LAB — IRON AND TIBC
Iron: <10 ug/dL — ABNORMAL LOW (ref 42–135)
UIBC: 239 ug/dL (ref 125–400)

## 2013-07-03 LAB — FOLATE: Folate: >20 ng/mL

## 2013-07-03 LAB — CBC
HCT: 36 % — ABNORMAL LOW (ref 39.0–52.0)
Hemoglobin: 12.3 g/dL — ABNORMAL LOW (ref 13.0–17.0)
MCH: 29.4 pg (ref 26.0–34.0)
MCHC: 34.2 g/dL (ref 30.0–36.0)
MCV: 85.9 fL (ref 78.0–100.0)
Platelets: 244 10*3/uL (ref 150–400)
RBC: 4.19 MIL/uL — ABNORMAL LOW (ref 4.22–5.81)
RDW: 14.3 % (ref 11.5–15.5)
WBC: 10.2 10*3/uL (ref 4.0–10.5)

## 2013-07-03 LAB — HIV ANTIBODY (ROUTINE TESTING W REFLEX): HIV: NONREACTIVE

## 2013-07-03 LAB — FERRITIN: Ferritin: 266 ng/mL (ref 22–322)

## 2013-07-03 LAB — STREP PNEUMONIAE URINARY ANTIGEN: Strep Pneumo Urinary Antigen: NEGATIVE

## 2013-07-03 LAB — VITAMIN B12: Vitamin B-12: 704 pg/mL (ref 211–911)

## 2013-07-03 MED ORDER — LEVALBUTEROL HCL 0.63 MG/3ML IN NEBU
0.6300 mg | INHALATION_SOLUTION | Freq: Four times a day (QID) | RESPIRATORY_TRACT | Status: DC
  Administered 2013-07-03 (×2): 0.63 mg via RESPIRATORY_TRACT
  Filled 2013-07-03 (×3): qty 3

## 2013-07-03 MED ORDER — BUDESONIDE-FORMOTEROL FUMARATE 160-4.5 MCG/ACT IN AERO
2.0000 | INHALATION_SPRAY | Freq: Two times a day (BID) | RESPIRATORY_TRACT | Status: DC
  Administered 2013-07-03 – 2013-07-04 (×2): 2 via RESPIRATORY_TRACT
  Filled 2013-07-03: qty 6

## 2013-07-03 NOTE — Progress Notes (Signed)
[facility] PROGRESS NOTE  John Black ZOX:[id number] DOB: 1929-06-04 DOA: 07/02/2013 PCP: Milinda Antis, MD  Assessment/Plan: Acute-on-chronic respiratory failure: Likely secondary to pneumonia. Some improvement this am. Will wean oxygen to 3L which pt reports is his dose at home when he wears it.  Will continue nebulizers. Transition to prednisone. continue oxygen support and monitor her saturation levels closely. Rocephin and azithromycin day #2. Await sputum cultures. Remains stable and nontoxic appearing  Active Problems:   Pneumonia: Patient failed outpatient treatment.  Rocephin and azithromycin day #2. White count within the limits of normal. Patient is afebrile. Await sputum culture. Strep pneumo and legionella antigen pending.   Leukocytosis, unspecified: Secondary to pneumonia. Resolved   Hyponatremia: Likely related to decreased by mouth intake over the last several days. Resolved.   Sinus tachycardia: Likely related to nebulizers. Changed this to Xopenex. Resolved. EKG showed no acute changes.   Anemia: Normocytic.  likely related to chronic disease. Will check anemia panel. No obvious s/sx bleeding.   COPD (chronic obstructive pulmonary disease) with emphysema: See #1 and #2. Will continue prednisone and nebulizers. Attempt to wean oxygen. Monitor oxygen saturation levels closely.   Hypothyroidism; TSH 2.7. Continue his home medications   BPH (benign prostatic hyperplasia): Baseline voiding is slow to start with slow stream.  Denies hematuria. PCP note indicates some concern for prostatitis.   Code Status: full Family Communication: daughter at bedside Disposition Plan: home when ready hopefully 24-48 hours   Consultants:  none  Procedures:  none  Antibiotics:  Rocephin 07/02/13>>>  Azithromycin 07/02/13>>  HPI/Subjective: Sitting in chair. Reports feeling "much better".   Objective: Filed Vitals:   07/03/13 0513  BP: 119/68  Pulse: 95   Temp: 97.3 F (36.3 C)  Resp: 22    Intake/Output Summary (Last 24 hours) at 07/03/13 1109 Last data filed at 07/03/13 0840  Gross per 24 hour  Intake    360 ml  Output    700 ml  Net   -340 ml   Filed Weights   07/02/13 1200 07/02/13 1534  Weight: 59.875 kg (132 lb) 59.5 kg (131 lb 2.8 oz)    Exam:   General:  Appears calm and comfortable in chair. NAD  Cardiovascular: RRR No MGR No LE edema  Respiratory: mild increased work of breathing with conversation. Not distressed. BS clear with faint expiratory wheeze right base. Good air movement  Abdomen: soft +BS non-tender to palpation  Musculoskeletal: fair muscle tone. Ambulates in room with steady gait   Data Reviewed: Basic Metabolic Panel:  Recent Labs Lab 06/26/13 1211 07/02/13 1223 07/03/13 0500  NA 141 133* 135  K 5.5* 4.4 4.9  CL 104 97 99  CO2 25 27 26   GLUCOSE 77 120* 176*  BUN 19 18 18   CREATININE 0.96 0.95 0.83  CALCIUM 10.0 9.8 9.6   Liver Function Tests:  Recent Labs Lab 06/26/13 1211  AST 26  ALT 22  ALKPHOS 53  BILITOT 0.6  PROT 6.5  ALBUMIN 4.5   No results found for this basename: LIPASE, AMYLASE,  in the last 168 hours No results found for this basename: AMMONIA,  in the last 168 hours CBC:  Recent Labs Lab 06/26/13 1211 06/30/13 1206 07/02/13 1223 07/03/13 0500  WBC 6.8 14.4* 12.6* 10.2  NEUTROABS 3.8 12.0* 10.5*  --   HGB 14.7 14.0 13.7 12.3*  HCT 44.1 42.3 39.6 36.0*  MCV 86.0 87.8 86.8 85.9  PLT 266 237 231 244   Cardiac Enzymes: No results found  for this basename: CKTOTAL, CKMB, CKMBINDEX, TROPONINI,  in the last 168 hours BNP (last 3 results) No results found for this basename: PROBNP,  in the last 8760 hours CBG: No results found for this basename: GLUCAP,  in the last 168 hours  Recent Results (from the past 240 hour(s))  URINE CULTURE     Status: None   Collection Time    06/30/13 12:06 PM      Result Value Range Status   Colony Count NO GROWTH   Final    Organism ID, Bacteria NO GROWTH   Final  INFLUENZA A AND B     Status: None   Collection Time    06/30/13 12:06 PM      Result Value Range Status   Source-INFBD NASAL   Final   Inflenza A Ag NEG  Negative Final   Influenza B Ag NEG  Negative Final     Studies: Dg Chest Portable 1 View  07/02/2013   CLINICAL DATA:  Short of breath with fever. History of prostate cancer.  EXAM: PORTABLE CHEST - 1 VIEW  COMPARISON:  10/02/2012  FINDINGS: Midline trachea. Normal heart size and mediastinal contours. No pleural effusion or pneumothorax. Right upper and infrahilar airspace disease. Clear left lung.  IMPRESSION: Right-sided airspace disease, likely pneumonia. Recommend radiographic follow-up until clearing.   Electronically Signed   By: Jeronimo Greaves M.D.   On: 07/02/2013 12:27    Scheduled Meds: . azithromycin  500 mg Intravenous Q24H  . budesonide-formoterol  2 puff Inhalation BID  . cefTRIAXone (ROCEPHIN)  IV  1 g Intravenous Q24H  . doxazosin  8 mg Oral QHS  . enoxaparin (LOVENOX) injection  40 mg Subcutaneous Q24H  . levalbuterol  0.63 mg Nebulization Q6H  . levothyroxine  75 mcg Oral QAC breakfast  . predniSONE  40 mg Oral Q breakfast  . sodium chloride  3 mL Intravenous Q12H   Continuous Infusions:   Principal Problem:   Acute-on-chronic respiratory failure Active Problems:   COPD (chronic obstructive pulmonary disease) with emphysema   Hypothyroidism   BPH (benign prostatic hyperplasia)   Pneumonia   Leukocytosis, unspecified   Hyponatremia   Sinus tachycardia   Anemia    Time spent: 35 minutes    [facility] M  [facility] Pager [phone]. If 7PM-7AM, please contact night-coverage at [email or web address], password [email or web address] 07/03/2013, 11:09 AM  LOS: 1 day

## 2013-07-03 NOTE — Progress Notes (Signed)
Pt is on Zithromax and Rocephin.  No renal adjustment is necessary for these medications.  Pharmacy will sign off.    Thanks, S. Margo Aye, PharmD

## 2013-07-03 NOTE — Care Management Note (Signed)
    Page 1 of 1   07/03/2013     2:03:25 PM   CARE MANAGEMENT NOTE 07/03/2013  Patient:  John Black,John Black   Account Number:  [id number]  Date Initiated:  07/03/2013  Documentation initiated by:  Rosemary Holms  Subjective/Objective Assessment:   Pt admitted from home where he lives with hes spouse. No HH/DME anticipated     Action/Plan:   Anticipated DC Date:  07/04/2013   Anticipated DC Plan:  HOME/SELF CARE      DC Planning Services  CM consult      Choice offered to / List presented to:             Status of service:  Completed, signed off Medicare Important Message given?   (If response is "NO", the following Medicare IM given date fields will be blank) Date Medicare IM given:   Date Additional Medicare IM given:    Discharge Disposition:    Per UR Regulation:    If discussed at Long Length of Stay Meetings, dates discussed:    Comments:  10/71/14 Rosemary Holms RN BSN CM

## 2013-07-03 NOTE — Progress Notes (Signed)
Patient seen, independently examined and chart reviewed. I agree with exam, assessment and plan discussed with Toya Smothers, NP.  Overall he feels better today. No new issues. Breathing better.  Afebrile, appears better. Ambulating in hall without difficulty. Exam without significant change today. Has had improvement in voiding.  Leukocytosis has resolved.  He appears to be rapidly improving with treatment for community-acquired pneumonia and acute on chronic respiratory failure. I do not consider this an outpatient treatment failure. Given his rapid improvement would anticipate discharge in 48 hours.  Brendia Sacks, MD [facility] [phone]

## 2013-07-03 NOTE — Progress Notes (Signed)
Utilization Review Complete  

## 2013-07-04 LAB — LEGIONELLA ANTIGEN, URINE: Legionella Antigen, Urine: NEGATIVE

## 2013-07-04 MED ORDER — PREDNISONE 10 MG PO TABS: ORAL_TABLET | ORAL | Status: DC

## 2013-07-04 MED ORDER — CEFUROXIME AXETIL 500 MG PO TABS: 500.0000 mg | ORAL_TABLET | Freq: Two times a day (BID) | ORAL | Status: DC

## 2013-07-04 MED ORDER — AZITHROMYCIN 500 MG PO TABS: 500.0000 mg | ORAL_TABLET | Freq: Every day | ORAL | Status: DC

## 2013-07-04 MED ORDER — AZITHROMYCIN 250 MG PO TABS: 500.0000 mg | ORAL_TABLET | Freq: Every day | ORAL | Status: DC

## 2013-07-04 MED ORDER — CEFUROXIME AXETIL 250 MG PO TABS: 500.0000 mg | ORAL_TABLET | Freq: Two times a day (BID) | ORAL | Status: DC

## 2013-07-04 MED ORDER — LEVALBUTEROL HCL 0.63 MG/3ML IN NEBU
0.6300 mg | INHALATION_SOLUTION | Freq: Four times a day (QID) | RESPIRATORY_TRACT | Status: DC
  Administered 2013-07-04: 0.63 mg via RESPIRATORY_TRACT
  Filled 2013-07-04: qty 3

## 2013-07-04 NOTE — Progress Notes (Signed)
Patient given discharge instructions with all questions answered. Left facility with wife using home O2 at 4L [place]. Left in stable condition. Thanked staff for assistance. Taken out of facility by Anitah,NT via wheelchair.

## 2013-07-04 NOTE — Discharge Summary (Signed)
Physician Discharge Summary  John Black ZOX:[id number] DOB: 11/16/1928 DOA: 07/02/2013  PCP: John Antis, MD  Admit date: 07/02/2013 Discharge date: 07/04/2013  Recommendations for Outpatient Follow-up:  1. Followup resolution of community-acquired pneumonia. Consider repeat chest x-ray to ensure resolution.  Follow-up Information   Follow up with John Antis, MD. Schedule an appointment as soon as possible for a visit in 1 week.   Specialty:  Family Medicine   Contact information:   [address] [place] [address] [phone]      Discharge Diagnoses:  1. Community acquired pneumonia with suspected early sepsis 2. Acute on chronic respiratory failure with hypoxia 3. COPD   Discharge Condition: Improved Disposition: Home  Diet recommendation: Regular  Filed Weights   07/02/13 1200 07/02/13 1534  Weight: 59.875 kg (132 lb) 59.5 kg (131 lb 2.8 oz)    History of present illness:  79 year old man with history of chronic respiratory failure, COPD who for the last several days to week has had increasing shortness of breath and some muscle aches. Several days prior to admission he developed fever and had also noticed some mild dysuria and greater difficulty urinating. He is seen by his primary care physician and placed on empiric Levaquin for possible pneumonia, prostatitis or viral syndrome. On reevaluation 10/16 he was noted to be hypoxic with increased respiratory symptoms and he was referred to the emergency department where he was found to have acute on chronic respiratory failure and right-sided pneumonia. There was also report of confusion and a fall at home. No syncope. The patient reports he first took tramadol last night and then Ambien and attributes his fall and confusion this morning to these 2 medications.   Hospital Course:  [name] was admitted for further treatment of pneumonia. His condition rapidly improved with treatment directed at  community acquired source. He had no evidence of confusion or complicating feature. Hospitalization was uncomplicated. He has chronic respiratory failure with home oxygen which he will continue as an outpatient.  1. Community acquired pneumonia with suspected early sepsis: Rapidly improving. Change to oral antibiotics. 2. Acute on chronic respiratory failure with hypoxia: Much improved. Ambulating without difficulty. Continue home oxygen. 3. COPD, chronic respiratory failure: Stable. Short prednisone taper. 4. Possible prostatitis: Diagnosed as an outpatient. Clinically improved. Finished antibiotics, followup as an outpatient.  Consultants:  None  Procedures:  none Antibiotics:  Azithromycin 10/16 >> 10/20  Ceftriaxone 10/16 >> 10/17  Ceftin 10/18 >> 10/22  Discharge Instructions  Discharge Orders   Future Appointments Provider Department Dept Phone   07/07/2013 2:30 PM Donita Brooks, MD [facility] [phone]   09/21/2013 10:30 AM Barbaraann Share, MD [facility] [phone]   Future Orders Complete By Expires   Diet general  As directed    Discharge instructions  As directed    Comments:     Be sure to finish antibiotics. Be sure to use your home oxygen. Call your physician or seek immediate medical attention for fever, increasing shortness of breath or worsening of condition.   Increase activity slowly  As directed        Medication List    STOP taking these medications       levofloxacin 500 MG tablet  Commonly known as:  LEVAQUIN      TAKE these medications       azithromycin 500 MG tablet  Commonly known as:  ZITHROMAX  Take 1 tablet (500 mg total) by mouth daily. Take  each evening around 6 pm. First dose this evening 10/18.     cefUROXime 500 MG tablet  Commonly known as:  CEFTIN  Take 1 tablet (500 mg total) by mouth 2 (two) times daily with a meal.     CITRACAL CALCIUM+D PO  Once a day     CLARITIN 5 MG chewable tablet   Generic drug:  loratadine  Chew 5 mg by mouth daily.     [employer] SPECTRAVITE SENIOR PO  Once a day     doxazosin 8 MG tablet  Commonly known as:  CARDURA  Take 8 mg by mouth at bedtime.     FISH OIL PO  Take 1,000 Units by mouth daily.     Ipratropium-Albuterol 20-100 MCG/ACT Aers respimat  Commonly known as:  COMBIVENT  Inhale 1 puff into the lungs every 6 (six) hours.     levothyroxine 75 MCG tablet  Commonly known as:  SYNTHROID, LEVOTHROID  Take 1 tablet (75 mcg total) by mouth daily.     mometasone 50 MCG/ACT nasal spray  Commonly known as:  NASONEX  Place 2 sprays into the nose daily.     predniSONE 10 MG tablet  Commonly known as:  DELTASONE  10/19: Take 40 mg in the morning oral. 10/20-10/22: Take 20 mg daily in the morning 10/23-10/25: Take 10 mg daily in the morning then stop.     SYMBICORT 160-4.5 MCG/ACT inhaler  Generic drug:  budesonide-formoterol  INHALE 2 PUFFS INTO THE LUNGS 2 (TWO) TIMES DAILY.     zolpidem 10 MG tablet  Commonly known as:  AMBIEN  TAKE 1/2 TO 1 TABLET AT BEDTIME AS NEEDED       Allergies  Allergen Reactions  . Morphine     REACTION: sweats    The results of significant diagnostics from this hospitalization (including imaging, microbiology, ancillary and laboratory) are listed below for reference.    Significant Diagnostic Studies: Dg Chest Portable 1 View  07/02/2013   CLINICAL DATA:  Short of breath with fever. History of prostate cancer.  EXAM: PORTABLE CHEST - 1 VIEW  COMPARISON:  10/02/2012  FINDINGS: Midline trachea. Normal heart size and mediastinal contours. No pleural effusion or pneumothorax. Right upper and infrahilar airspace disease. Clear left lung.  IMPRESSION: Right-sided airspace disease, likely pneumonia. Recommend radiographic follow-up until clearing.   Electronically Signed   By: Jeronimo Greaves M.D.   On: 07/02/2013 12:27    Microbiology: Recent Results (from the past 240 hour(s))  URINE CULTURE     Status:  None   Collection Time    06/30/13 12:06 PM      Result Value Range Status   Colony Count NO GROWTH   Final   Organism ID, Bacteria NO GROWTH   Final  INFLUENZA A AND B     Status: None   Collection Time    06/30/13 12:06 PM      Result Value Range Status   Source-INFBD NASAL   Final   Inflenza A Ag NEG  Negative Final   Influenza B Ag NEG  Negative Final  CULTURE, BLOOD (ROUTINE X 2)     Status: None   Collection Time    07/02/13  4:48 PM      Result Value Range Status   Specimen Description BLOOD RIGHT ANTECUBITAL   Final   Special Requests     Final   Value: BOTTLES DRAWN AEROBIC AND ANAEROBIC AEB=11CC ANA=6CC   Culture NO GROWTH 2 DAYS   Final  Report Status PENDING   Incomplete  CULTURE, BLOOD (ROUTINE X 2)     Status: None   Collection Time    07/02/13  4:51 PM      Result Value Range Status   Specimen Description BLOOD LEFT ANTECUBITAL   Final   Special Requests     Final   Value: BOTTLES DRAWN AEROBIC AND ANAEROBIC AEB=12CC ANA=6CC   Culture NO GROWTH 2 DAYS   Final   Report Status PENDING   Incomplete     Labs: Basic Metabolic Panel:  Recent Labs Lab 07/02/13 1223 07/03/13 0500  NA 133* 135  K 4.4 4.9  CL 97 99  CO2 27 26  GLUCOSE 120* 176*  BUN 18 18  CREATININE 0.95 0.83  CALCIUM 9.8 9.6   CBC:  Recent Labs Lab 06/30/13 1206 07/02/13 1223 07/03/13 0500  WBC 14.4* 12.6* 10.2  NEUTROABS 12.0* 10.5*  --   HGB 14.0 13.7 12.3*  HCT 42.3 39.6 36.0*  MCV 87.8 86.8 85.9  PLT 237 231 244    Principal Problem:   Acute-on-chronic respiratory failure Active Problems:   COPD (chronic obstructive pulmonary disease) with emphysema   Hypothyroidism   BPH (benign prostatic hyperplasia)   Pneumonia   Leukocytosis, unspecified   Hyponatremia   Sinus tachycardia   Anemia   Time coordinating discharge:  20 minutes  Signed:  Brendia Sacks, MD [facility] 07/04/2013, 10:36 AM

## 2013-07-04 NOTE — Progress Notes (Signed)
[facility] PROGRESS NOTE  John Black JXB:[id number] DOB: 11-24-1928 DOA: 07/02/2013 PCP: Milinda Antis, MD  Assessment/Plan: 1. Community acquired pneumonia with suspected early sepsis: Rapidly improving. Change to oral antibiotics. 2. Acute on chronic respiratory failure with hypoxia: Much improved. Ambulating without difficulty. Continue home oxygen. 3. COPD, chronic respiratory failure: Stable. Short prednisone taper. 4. Possible prostatitis: Clinically improved. Finished antibiotics, followup as an outpatient.   Change to oral antibiotics  Short prednisone taper  Continue home oxygen  Discharge home  Pending studies:   Legionella antigen  Blood cultures no growth to date  Brendia Sacks, MD  [facility]  Pager [phone] If 7PM-7AM, please contact night-coverage at [email or web address], password [facility] 07/04/2013, 10:25 AM  LOS: 2 days   Summary: 79 year old man with history of chronic respiratory failure, COPD who for the last several days to week has had increasing shortness of breath and some muscle aches. Several days prior to admission he developed fever and had also noticed some mild dysuria and greater difficulty urinating. He is seen by his primary care physician and placed on empiric Levaquin for possible pneumonia, prostatitis or viral syndrome. On reevaluation 10/16 he was noted to be hypoxic with increased respiratory symptoms and he was referred to the emergency department where he was found to have acute on chronic respiratory failure and right-sided pneumonia. There was also report of confusion and a fall at home. No syncope. The patient reports he first took tramadol last night and then Ambien and attributes his fall and confusion this morning to these 2 medications.  Consultants:  None   Procedures:  none  Antibiotics:  Azithromycin 10/16 >> 10/20  Ceftriaxone 10/16 >> 10/17  Ceftin 10/18 >> 10/22  HPI/Subjective: Feels much better  today. He has been ambulating without difficulty. He feels ready to go home. Breathing better.  Objective: Filed Vitals:   07/03/13 1447 07/03/13 1932 07/03/13 2151 07/04/13 0727  BP:   107/67   Pulse:   86   Temp:   98.3 F (36.8 C)   TempSrc:   Oral   Resp:   24   Height:      Weight:      SpO2: 92% 91% 92% 91%    Intake/Output Summary (Last 24 hours) at 07/04/13 1025 Last data filed at 07/03/13 1300  Gross per 24 hour  Intake    240 ml  Output      0 ml  Net    240 ml     Filed Weights   07/02/13 1200 07/02/13 1534  Weight: 59.875 kg (132 lb) 59.5 kg (131 lb 2.8 oz)    Exam:   Afebrile, vital signs stable  General: Appears calm and comfortable. Appears vigorous.  Psychiatric: Grossly normal mood and affect. Speech fluent and appropriate.  Cardiovascular: Regular rate and rhythm. No murmur, rub, gallop.  Respiratory: Fair air movement. No wheezes, rales or rhonchi. Normal respiratory effort.  Data Reviewed:  No new data  Scheduled Meds: . azithromycin  500 mg Oral q1800  . budesonide-formoterol  2 puff Inhalation BID  . cefTRIAXone (ROCEPHIN)  IV  1 g Intravenous Q24H  . doxazosin  8 mg Oral QHS  . enoxaparin (LOVENOX) injection  40 mg Subcutaneous Q24H  . levalbuterol  0.63 mg Nebulization Q6H WA  . levothyroxine  75 mcg Oral QAC breakfast  . predniSONE  40 mg Oral Q breakfast  . sodium chloride  3 mL Intravenous Q12H   Continuous Infusions:   Principal Problem:   Acute-on-chronic  respiratory failure Active Problems:   COPD (chronic obstructive pulmonary disease) with emphysema   Hypothyroidism   BPH (benign prostatic hyperplasia)   Pneumonia   Leukocytosis, unspecified   Hyponatremia   Sinus tachycardia   Anemia

## 2013-07-04 NOTE — Progress Notes (Signed)
Pt very independent and active throughout my shift.  No c/o shortness of breath or pain.  Patient and family eager for pt to return home.

## 2013-07-04 NOTE — Progress Notes (Signed)
PHARMACIST - PHYSICIAN COMMUNICATION DR:   [place] CONCERNING: Antibiotic IV to Oral Route Change Policy  RECOMMENDATION: This patient is receiving Zithromax by the intravenous route.  Based on criteria approved by the Pharmacy and Therapeutics Committee, the antibiotic(s) is/are being converted to the equivalent oral dose form(s).  DESCRIPTION: These criteria include:  Patient being treated for a respiratory tract infection, urinary tract infection, cellulitis or clostridium difficile associated diarrhea if on metronidazole  The patient is not neutropenic and does not exhibit a GI malabsorption state  The patient is eating (either orally or via tube) and/or has been taking other orally administered medications for a least 24 hours  The patient is improving clinically and has a Tmax < 100.5  If you have questions about this conversion, please contact the Pharmacy Department  [x]  ( [phone] )  [facility] []  ( [phone] )  [facility]  []  ( [phone] )  [facility] []  ( [phone] )  [facility]    S. Wade Sigala, PharmD 

## 2013-07-06 ENCOUNTER — Emergency Department (HOSPITAL_COMMUNITY): Payer: Medicare Other

## 2013-07-06 ENCOUNTER — Ambulatory Visit: Payer: Self-pay | Admitting: Family Medicine

## 2013-07-06 ENCOUNTER — Encounter (HOSPITAL_COMMUNITY): Payer: Self-pay | Admitting: Emergency Medicine

## 2013-07-06 ENCOUNTER — Inpatient Hospital Stay (HOSPITAL_COMMUNITY)
Admission: EM | Admit: 2013-07-06 | Discharge: 2013-07-11 | DRG: 177 | Disposition: A | Discharge status: Home Health | Payer: Medicare Other | Attending: Internal Medicine | Admitting: Internal Medicine

## 2013-07-06 DIAGNOSIS — Z8042 Family history of malignant neoplasm of prostate: Secondary | ICD-10-CM

## 2013-07-06 DIAGNOSIS — Z87891 Personal history of nicotine dependence: Secondary | ICD-10-CM

## 2013-07-06 DIAGNOSIS — R918 Other nonspecific abnormal finding of lung field: Secondary | ICD-10-CM

## 2013-07-06 DIAGNOSIS — I498 Other specified cardiac arrhythmias: Secondary | ICD-10-CM | POA: Diagnosis present

## 2013-07-06 DIAGNOSIS — J449 Chronic obstructive pulmonary disease, unspecified: Secondary | ICD-10-CM | POA: Diagnosis present

## 2013-07-06 DIAGNOSIS — E871 Hypo-osmolality and hyponatremia: Secondary | ICD-10-CM | POA: Diagnosis present

## 2013-07-06 DIAGNOSIS — J439 Emphysema, unspecified: Secondary | ICD-10-CM

## 2013-07-06 DIAGNOSIS — J441 Chronic obstructive pulmonary disease with (acute) exacerbation: Secondary | ICD-10-CM | POA: Diagnosis present

## 2013-07-06 DIAGNOSIS — J309 Allergic rhinitis, unspecified: Secondary | ICD-10-CM | POA: Diagnosis present

## 2013-07-06 DIAGNOSIS — Z9981 Dependence on supplemental oxygen: Secondary | ICD-10-CM

## 2013-07-06 DIAGNOSIS — E039 Hypothyroidism, unspecified: Secondary | ICD-10-CM | POA: Diagnosis present

## 2013-07-06 DIAGNOSIS — J9620 Acute and chronic respiratory failure, unspecified whether with hypoxia or hypercapnia: Secondary | ICD-10-CM | POA: Diagnosis present

## 2013-07-06 DIAGNOSIS — R000 Tachycardia, unspecified: Secondary | ICD-10-CM | POA: Diagnosis present

## 2013-07-06 DIAGNOSIS — Z8546 Personal history of malignant neoplasm of prostate: Secondary | ICD-10-CM

## 2013-07-06 DIAGNOSIS — J852 Abscess of lung without pneumonia: Principal | ICD-10-CM | POA: Diagnosis present

## 2013-07-06 DIAGNOSIS — Z2989 Encounter for other specified prophylactic measures: Secondary | ICD-10-CM

## 2013-07-06 DIAGNOSIS — D649 Anemia, unspecified: Secondary | ICD-10-CM | POA: Diagnosis present

## 2013-07-06 DIAGNOSIS — Z418 Encounter for other procedures for purposes other than remedying health state: Secondary | ICD-10-CM

## 2013-07-06 DIAGNOSIS — R9389 Abnormal findings on diagnostic imaging of other specified body structures: Secondary | ICD-10-CM | POA: Diagnosis present

## 2013-07-06 DIAGNOSIS — E785 Hyperlipidemia, unspecified: Secondary | ICD-10-CM | POA: Diagnosis present

## 2013-07-06 DIAGNOSIS — J850 Gangrene and necrosis of lung: Secondary | ICD-10-CM

## 2013-07-06 DIAGNOSIS — J189 Pneumonia, unspecified organism: Secondary | ICD-10-CM

## 2013-07-06 DIAGNOSIS — D72829 Elevated white blood cell count, unspecified: Secondary | ICD-10-CM | POA: Diagnosis present

## 2013-07-06 DIAGNOSIS — Z8249 Family history of ischemic heart disease and other diseases of the circulatory system: Secondary | ICD-10-CM

## 2013-07-06 DIAGNOSIS — R079 Chest pain, unspecified: Secondary | ICD-10-CM | POA: Diagnosis present

## 2013-07-06 HISTORY — DX: Pneumonia, unspecified organism: J18.9

## 2013-07-06 HISTORY — DX: Chronic respiratory failure, unspecified whether with hypoxia or hypercapnia: J96.10

## 2013-07-06 LAB — BASIC METABOLIC PANEL
BUN: 18 mg/dL (ref 6–23)
CO2: 28 mEq/L (ref 19–32)
Calcium: 9.2 mg/dL (ref 8.4–10.5)
Chloride: 96 mEq/L (ref 96–112)
Creatinine, Ser: 0.76 mg/dL (ref 0.50–1.35)
GFR calc Af Amer: >90 mL/min (ref >90)
GFR calc non Af Amer: 83 mL/min — ABNORMAL LOW (ref >90)
Glucose, Bld: 121 mg/dL — ABNORMAL HIGH (ref 70–99)
Potassium: 3.7 mEq/L (ref 3.5–5.1)
Sodium: 133 mEq/L — ABNORMAL LOW (ref 135–145)

## 2013-07-06 LAB — URINE MICROSCOPIC-ADD ON

## 2013-07-06 LAB — TROPONIN I: Troponin I: <0.3 ng/mL (ref <0.30)

## 2013-07-06 LAB — URINALYSIS, ROUTINE W REFLEX MICROSCOPIC
Bilirubin Urine: NEGATIVE
Glucose, UA: NEGATIVE mg/dL
Ketones, ur: NEGATIVE mg/dL
Leukocytes, UA: NEGATIVE
Nitrite: NEGATIVE
Protein, ur: NEGATIVE mg/dL
Specific Gravity, Urine: 1.025 (ref 1.005–1.030)
Urobilinogen, UA: 0.2 mg/dL (ref 0.0–1.0)
pH: 6 (ref 5.0–8.0)

## 2013-07-06 LAB — CBC WITH DIFFERENTIAL/PLATELET
Basophils Absolute: 0 10*3/uL (ref 0.0–0.1)
Basophils Relative: 0 % (ref 0–1)
Eosinophils Absolute: 0 10*3/uL (ref 0.0–0.7)
Eosinophils Relative: 0 % (ref 0–5)
HCT: 39.6 % (ref 39.0–52.0)
Hemoglobin: 13.6 g/dL (ref 13.0–17.0)
Lymphocytes Relative: 8 % — ABNORMAL LOW (ref 12–46)
Lymphs Abs: 1.3 10*3/uL (ref 0.7–4.0)
MCH: 29.2 pg (ref 26.0–34.0)
MCHC: 34.3 g/dL (ref 30.0–36.0)
MCV: 85.2 fL (ref 78.0–100.0)
Monocytes Absolute: 1.2 10*3/uL — ABNORMAL HIGH (ref 0.1–1.0)
Monocytes Relative: 7 % (ref 3–12)
Neutro Abs: 13.8 10*3/uL — ABNORMAL HIGH (ref 1.7–7.7)
Neutrophils Relative %: 84 % — ABNORMAL HIGH (ref 43–77)
Platelets: 326 10*3/uL (ref 150–400)
RBC: 4.65 MIL/uL (ref 4.22–5.81)
RDW: 14.6 % (ref 11.5–15.5)
WBC: 16.4 10*3/uL — ABNORMAL HIGH (ref 4.0–10.5)

## 2013-07-06 LAB — MRSA PCR SCREENING: MRSA by PCR: NEGATIVE

## 2013-07-06 LAB — MAGNESIUM: Magnesium: 1.9 mg/dL (ref 1.5–2.5)

## 2013-07-06 IMAGING — CT CT CHEST W/ CM
2 of 3 series · 15 of 36 positions shown, 18 images · IV contrast (Omnipaque 300)
Comparison: [DATE] and [DATE] chest x-ray. No comparison
chest CT.

CLINICAL DATA: Abnormal chest x-ray. Cough.

EXAM:
CT CHEST WITH CONTRAST
TECHNIQUE: Multidetector CT imaging of the chest was performed during
intravenous contrast administration.
CONTRAST:  80mL OMNIPAQUE IOHEXOL 300 MG/ML  SOLN

[Series 2: chestroutine 5.0 b40f · axial · 0.67mm/px · z∈[-308,-13]mm · 12 of 71 slices shown, 15 images]
[im 6/71  mediastinal]
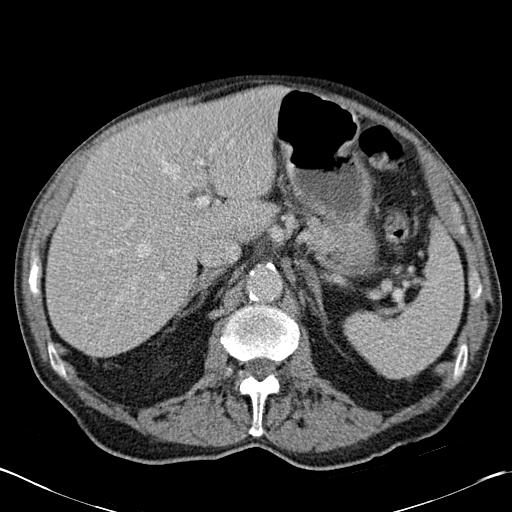
[im 6/71  lung]
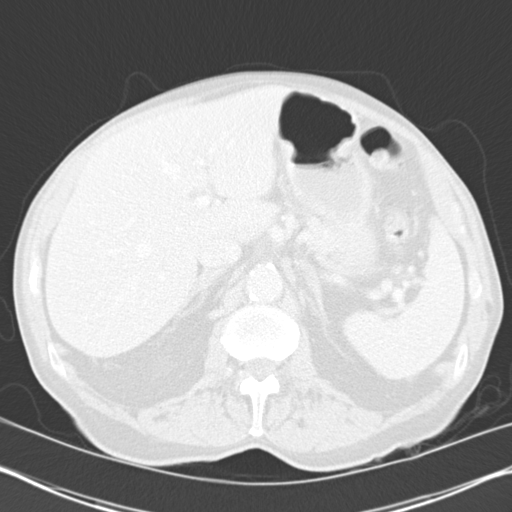
[im 11/71  lung]
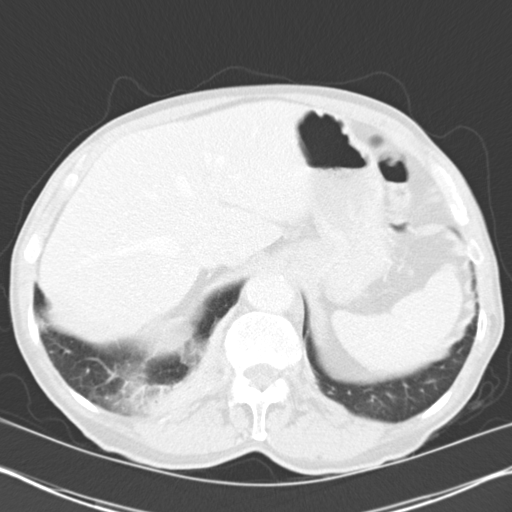
[im 16/71  lung]
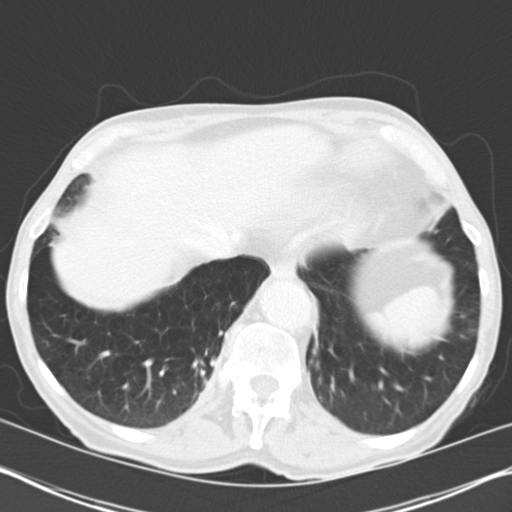
[im 21/71  lung]
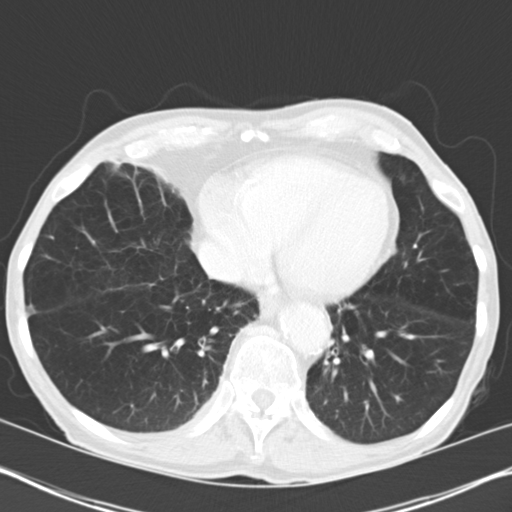
[im 26/71  mediastinal]
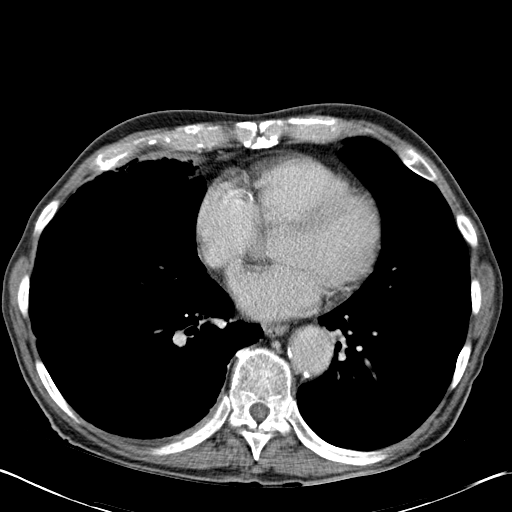
[im 26/71  lung]
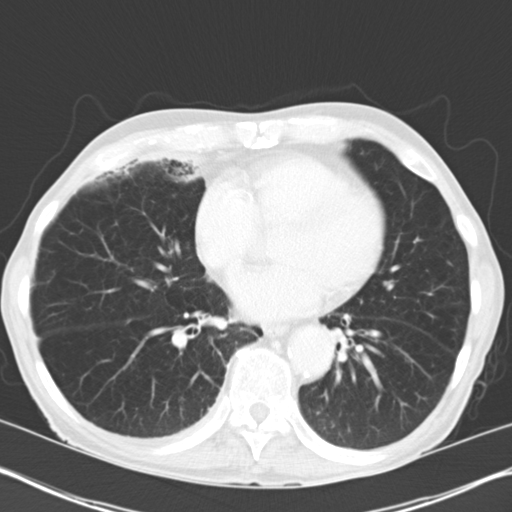
[im 32/71  lung]
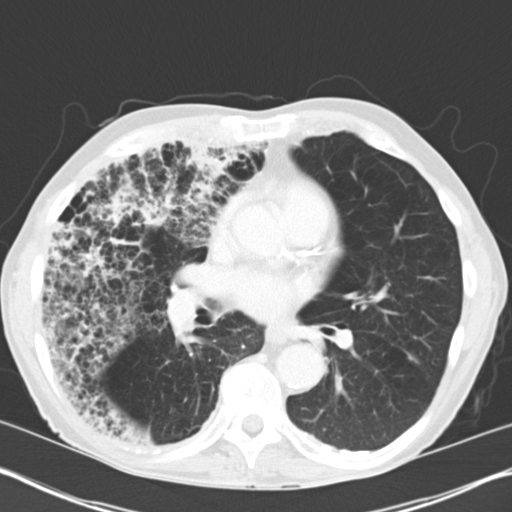
[im 39/71  lung]
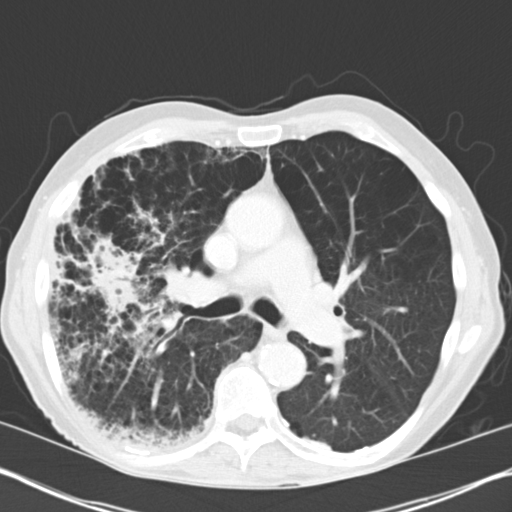
[im 45/71  lung]
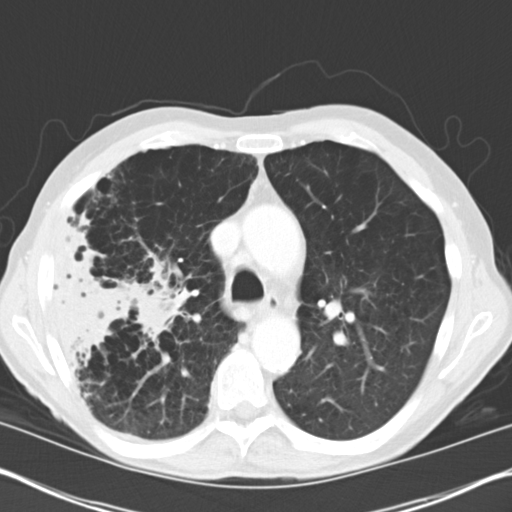
[im 50/71  mediastinal]
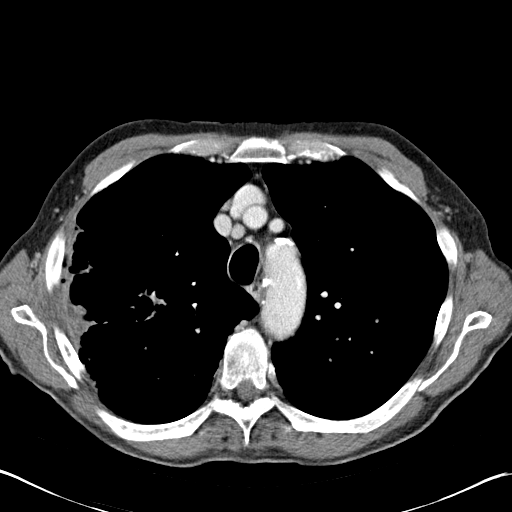
[im 50/71  lung]
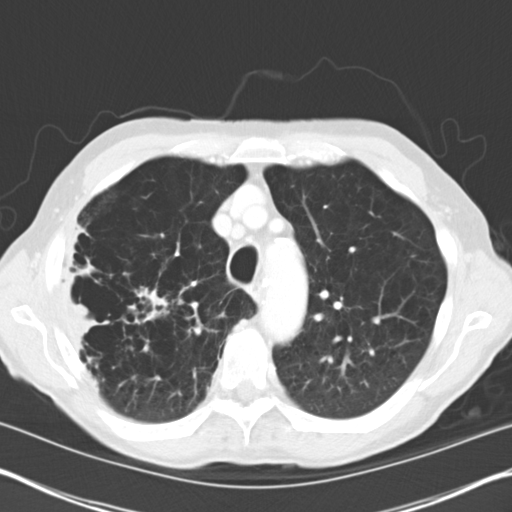
[im 55/71  lung]
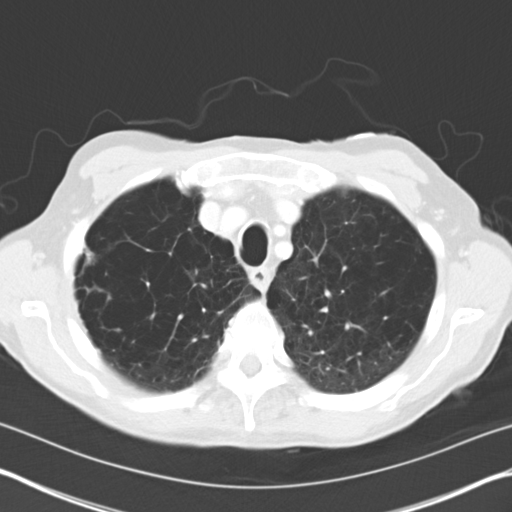
[im 60/71  lung]
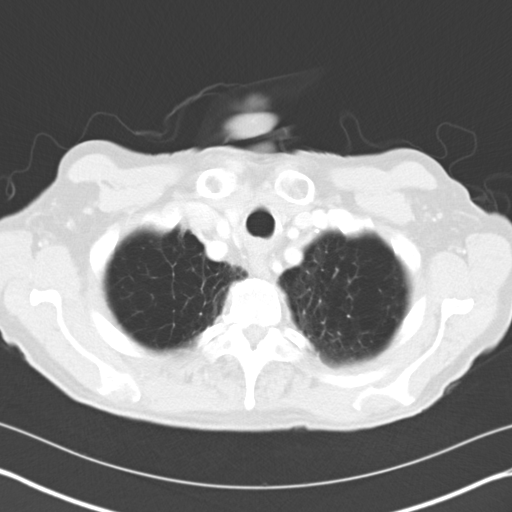
[im 65/71  lung]
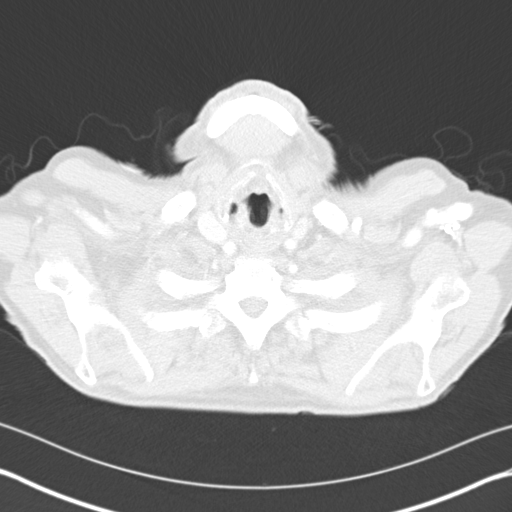

[Series 4: mpr coronal chest 3mm · coronal · 0.67mm/px · 3 of 89 slices shown]
[im 18/89  lung]
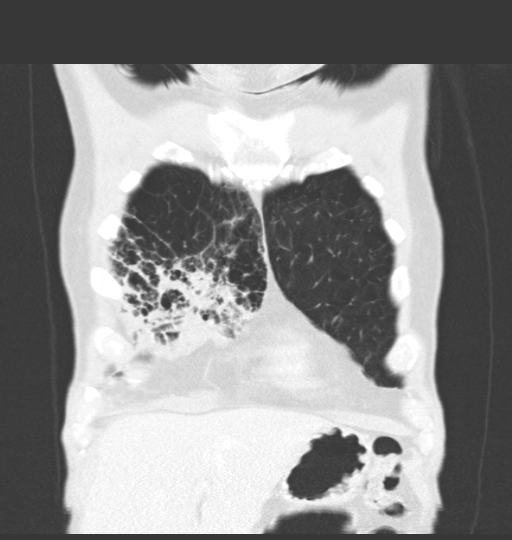
[im 36/89  lung]
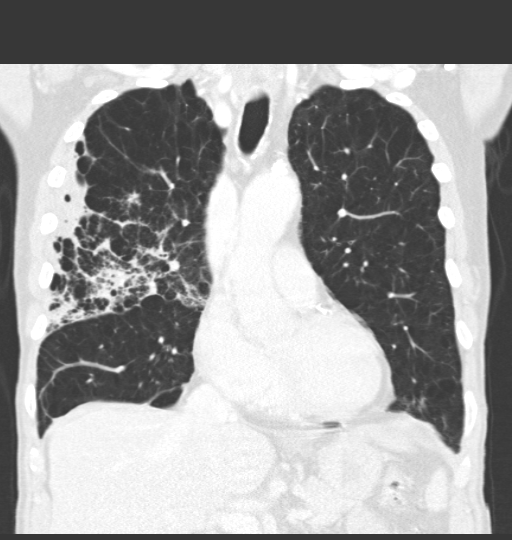
[im 53/89  lung]
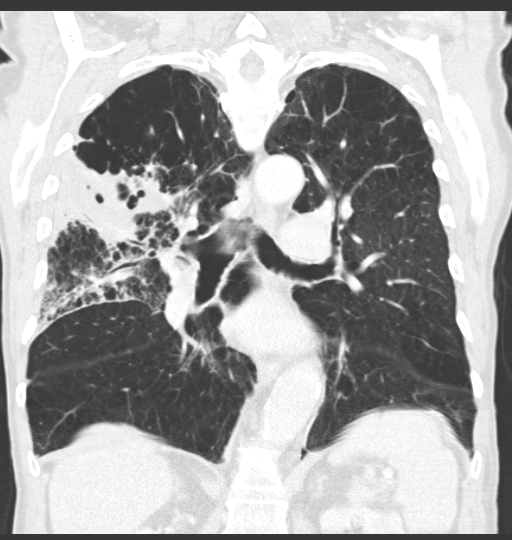

[15 of 36 positions shown; findings below may reference images not displayed]

FINDINGS: Diffuse consolidation right upper lobe with areas of cavitation.
These findings are superimposed upon chronic obstructive pulmonary
changes and may represent diffuse infectious process potentially an
atypical infection such as tuberculosis. Recommend followup until
clearance to help exclude underlying malignancy.

Pretracheal adenopathy measures 2.9 x 2 cm maximal dimension.

Pulmonary embolus protocol not utilized. No large central pulmonary
embolus.

Mild fatty infiltration of the liver without focal hepatic lesion
noted.

Coronary artery calcifications. Heart size top-normal.

Atherosclerotic type changes thoracic aorta with ectasia.

Kyphosis without bony destructive lesion.
IMPRESSION: Diffuse consolidation right upper lobe with areas of cavitation may
represent diffuse infectious process potentially an atypical
infection such as tuberculosis. Recommend followup until clearance.

Please see above.

## 2013-07-06 IMAGING — CR DG CHEST 1V PORT
1 series · 1 of 1 positions shown · non-contrast
Comparison: [DATE]

CLINICAL DATA: Fever and shortness of breath

EXAM:
PORTABLE CHEST - 1 VIEW

[portable]
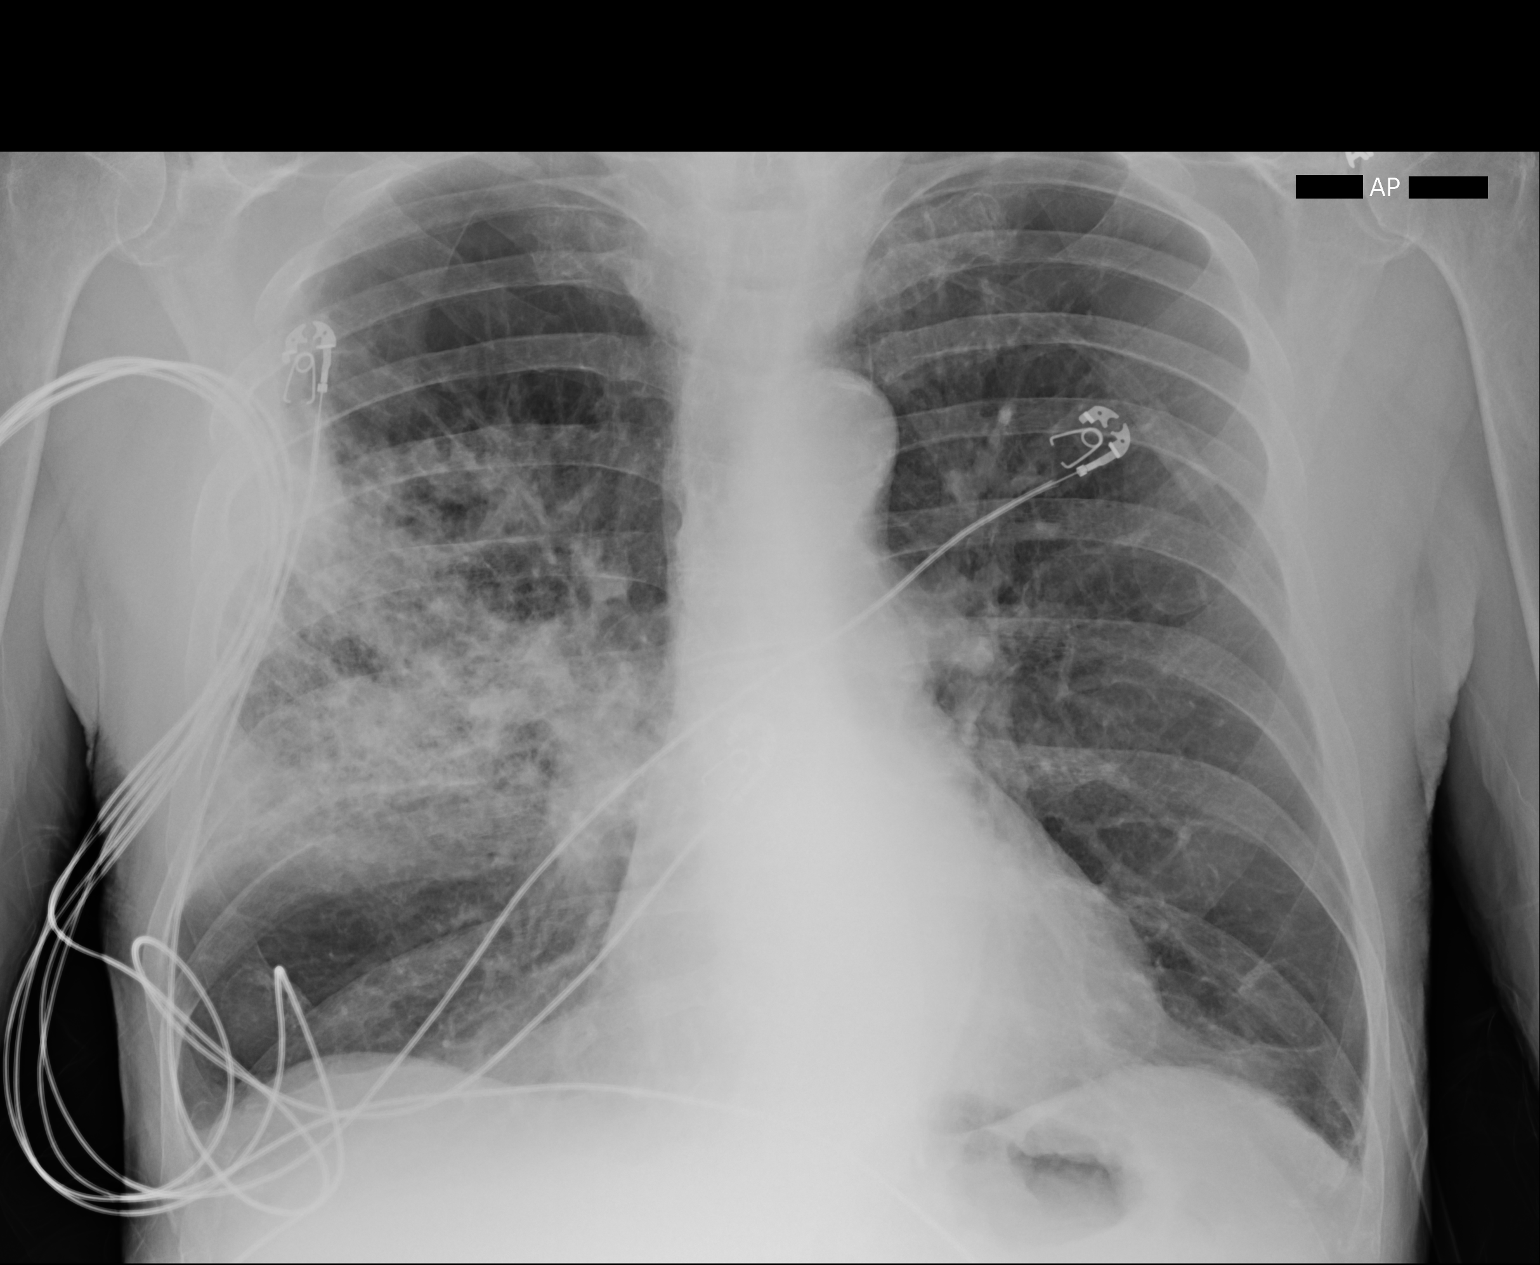

[1 of 1 positions shown; findings below may reference images not displayed]

FINDINGS: Cardiac shadow is stable. The previously seen right-sided infiltrate
is again identified and has increased in the interval from the prior
exam predominately within the lower lobe. The left lung is clear. No
bony abnormality is noted.
IMPRESSION: Increasing right-sided pneumonia.

## 2013-07-06 MED ORDER — IPRATROPIUM BROMIDE 0.02 % IN SOLN
0.5000 mg | Freq: Four times a day (QID) | RESPIRATORY_TRACT | Status: DC
  Administered 2013-07-06 (×2): 0.5 mg via RESPIRATORY_TRACT
  Filled 2013-07-06 (×2): qty 2.5

## 2013-07-06 MED ORDER — LORATADINE 10 MG PO TABS
5.0000 mg | ORAL_TABLET | Freq: Every day | ORAL | Status: DC
  Administered 2013-07-08 – 2013-07-11 (×3): 5 mg via ORAL
  Filled 2013-07-06 (×5): qty 1

## 2013-07-06 MED ORDER — SODIUM CHLORIDE 0.9 % IV SOLN
INTRAVENOUS | Status: AC
  Administered 2013-07-06: 14:00:00 via INTRAVENOUS

## 2013-07-06 MED ORDER — VANCOMYCIN HCL IN DEXTROSE 750-5 MG/150ML-% IV SOLN
750.0000 mg | Freq: Two times a day (BID) | INTRAVENOUS | Status: DC
  Administered 2013-07-06 – 2013-07-07 (×3): 750 mg via INTRAVENOUS
  Filled 2013-07-06 (×4): qty 150

## 2013-07-06 MED ORDER — IPRATROPIUM BROMIDE 0.02 % IN SOLN
0.5000 mg | Freq: Three times a day (TID) | RESPIRATORY_TRACT | Status: DC
  Administered 2013-07-07: 0.5 mg via RESPIRATORY_TRACT
  Filled 2013-07-06: qty 2.5

## 2013-07-06 MED ORDER — LEVALBUTEROL HCL 0.63 MG/3ML IN NEBU
0.6300 mg | INHALATION_SOLUTION | RESPIRATORY_TRACT | Status: DC | PRN
  Administered 2013-07-07 – 2013-07-08 (×2): 0.63 mg via RESPIRATORY_TRACT
  Filled 2013-07-06 (×2): qty 3

## 2013-07-06 MED ORDER — ENOXAPARIN SODIUM 40 MG/0.4ML ~~LOC~~ SOLN
40.0000 mg | SUBCUTANEOUS | Status: DC
  Filled 2013-07-06 (×5): qty 0.4

## 2013-07-06 MED ORDER — DEXTROSE 5 % IV SOLN
1.0000 g | Freq: Three times a day (TID) | INTRAVENOUS | Status: DC
  Filled 2013-07-06 (×4): qty 1

## 2013-07-06 MED ORDER — BUDESONIDE-FORMOTEROL FUMARATE 160-4.5 MCG/ACT IN AERO
2.0000 | INHALATION_SPRAY | Freq: Two times a day (BID) | RESPIRATORY_TRACT | Status: DC
  Administered 2013-07-06 – 2013-07-11 (×10): 2 via RESPIRATORY_TRACT
  Filled 2013-07-06: qty 6

## 2013-07-06 MED ORDER — LEVALBUTEROL HCL 0.63 MG/3ML IN NEBU
0.6300 mg | INHALATION_SOLUTION | Freq: Three times a day (TID) | RESPIRATORY_TRACT | Status: DC
  Administered 2013-07-07 – 2013-07-11 (×14): 0.63 mg via RESPIRATORY_TRACT
  Filled 2013-07-06 (×14): qty 3

## 2013-07-06 MED ORDER — BUDESONIDE-FORMOTEROL FUMARATE 160-4.5 MCG/ACT IN AERO
2.0000 | INHALATION_SPRAY | Freq: Two times a day (BID) | RESPIRATORY_TRACT | Status: DC
  Filled 2013-07-06: qty 6

## 2013-07-06 MED ORDER — FLUTICASONE PROPIONATE 50 MCG/ACT NA SUSP
2.0000 | Freq: Every day | NASAL | Status: DC
  Filled 2013-07-06: qty 16

## 2013-07-06 MED ORDER — DEXTROSE 5 % IV SOLN
1.0000 g | Freq: Three times a day (TID) | INTRAVENOUS | Status: DC
  Administered 2013-07-06 – 2013-07-07 (×3): 1 g via INTRAVENOUS
  Filled 2013-07-06 (×6): qty 1

## 2013-07-06 MED ORDER — OXYCODONE-ACETAMINOPHEN 5-325 MG PO TABS
1.0000 | ORAL_TABLET | Freq: Four times a day (QID) | ORAL | Status: DC | PRN
  Administered 2013-07-06 – 2013-07-10 (×9): 1 via ORAL
  Filled 2013-07-06 (×10): qty 1

## 2013-07-06 MED ORDER — ACETAMINOPHEN 325 MG PO TABS
650.0000 mg | ORAL_TABLET | ORAL | Status: DC | PRN
  Administered 2013-07-06 (×2): 650 mg via ORAL
  Filled 2013-07-06 (×2): qty 2

## 2013-07-06 MED ORDER — PREDNISONE 20 MG PO TABS: 20.0000 mg | ORAL_TABLET | Freq: Every day | ORAL | Status: DC

## 2013-07-06 MED ORDER — IOHEXOL 300 MG/ML  SOLN
80.0000 mL | Freq: Once | INTRAMUSCULAR | Status: AC | PRN
  Administered 2013-07-06: 80 mL via INTRAVENOUS

## 2013-07-06 MED ORDER — LEVALBUTEROL HCL 0.63 MG/3ML IN NEBU
0.6300 mg | INHALATION_SOLUTION | Freq: Four times a day (QID) | RESPIRATORY_TRACT | Status: DC
  Administered 2013-07-06 (×2): 0.63 mg via RESPIRATORY_TRACT
  Filled 2013-07-06 (×2): qty 3

## 2013-07-06 MED ORDER — ZOLPIDEM TARTRATE 5 MG PO TABS
5.0000 mg | ORAL_TABLET | Freq: Every evening | ORAL | Status: DC | PRN
  Administered 2013-07-06 – 2013-07-07 (×2): 5 mg via ORAL
  Filled 2013-07-06 (×2): qty 1

## 2013-07-06 MED ORDER — LEVOTHYROXINE SODIUM 75 MCG PO TABS
75.0000 ug | ORAL_TABLET | Freq: Every day | ORAL | Status: DC
  Administered 2013-07-07 – 2013-07-11 (×5): 75 ug via ORAL
  Filled 2013-07-06 (×5): qty 1

## 2013-07-06 MED ORDER — IPRATROPIUM BROMIDE 0.02 % IN SOLN: 0.5000 mg | RESPIRATORY_TRACT | Status: DC | PRN

## 2013-07-06 MED ORDER — OMEGA-3-ACID ETHYL ESTERS 1 G PO CAPS
2.0000 g | ORAL_CAPSULE | Freq: Every day | ORAL | Status: DC
  Administered 2013-07-08 – 2013-07-11 (×4): 2 g via ORAL
  Filled 2013-07-06 (×6): qty 2

## 2013-07-06 MED ORDER — VANCOMYCIN HCL IN DEXTROSE 1-5 GM/200ML-% IV SOLN
1000.0000 mg | INTRAVENOUS | Status: AC
  Administered 2013-07-06: 1000 mg via INTRAVENOUS
  Filled 2013-07-06: qty 200

## 2013-07-06 MED ORDER — IPRATROPIUM BROMIDE 0.02 % IN SOLN
0.5000 mg | Freq: Once | RESPIRATORY_TRACT | Status: AC
  Administered 2013-07-06: 0.5 mg via RESPIRATORY_TRACT
  Filled 2013-07-06: qty 2.5

## 2013-07-06 MED ORDER — DOXAZOSIN MESYLATE 2 MG PO TABS
8.0000 mg | ORAL_TABLET | Freq: Every day | ORAL | Status: DC
  Administered 2013-07-06 – 2013-07-10 (×5): 8 mg via ORAL
  Filled 2013-07-06 (×5): qty 4

## 2013-07-06 MED ORDER — ALBUTEROL SULFATE (5 MG/ML) 0.5% IN NEBU
5.0000 mg | INHALATION_SOLUTION | Freq: Once | RESPIRATORY_TRACT | Status: AC
  Administered 2013-07-06: 5 mg via RESPIRATORY_TRACT
  Filled 2013-07-06: qty 1

## 2013-07-06 MED ORDER — DEXTROSE 5 % IV SOLN
2.0000 g | INTRAVENOUS | Status: AC
  Administered 2013-07-06: 2 g via INTRAVENOUS

## 2013-07-06 NOTE — H&P (Signed)
I have seen and assessed patient and agree with Toya Smothers, NP assessment and plan.  The patient is an 79 year old gentleman with a past medical history of chronic respiratory failure on home O2, history of COPD, thyroid disease, chronic bronchitis who was recently discharged from the hospital for community-acquired pneumonia presenting 2 days post discharge with worsening shortness of breath, fever, right-sided pleuritic chest pain with chest x-ray showing worsening right-sided pneumonia. Patient using some accessory muscles of respiration however speaking in full sentences. Patient also noted to be tachycardic. Admit the patient to the step down unit for close observation, treat empirically with IV vancomycin and cefepime for a healthcare associated pneumonia, nebs, oxygen, CT of the chest as patient does have a greater than 50-pack-year tobacco history. Monitor.

## 2013-07-06 NOTE — Progress Notes (Signed)
ANTIBIOTIC CONSULT NOTE - INITIAL  Pharmacy Consult for Vancomycin and Cefepime Indication: pneumonia  Allergies  Allergen Reactions  . Morphine     REACTION: sweats    Patient Measurements: Height: 5\' 6"  (167.6 cm) Weight: 134 lb (60.782 kg) IBW/kg (Calculated) : 63.8  Vital Signs: Temp: 99.2 F (37.3 C) (10/20 0942) Temp src: Oral (10/20 0942) BP: 122/70 mmHg (10/20 1100) Pulse Rate: 96 (10/20 1248) Intake/Output from previous day:   Intake/Output from this shift:    Labs:  Recent Labs  07/06/13 1011  WBC 16.4*  HGB 13.6  PLT 326  CREATININE 0.76   Estimated Creatinine Clearance: 61.2 ml/min (by C-G formula based on Cr of 0.76). No results found for this basename: VANCOTROUGH, VANCOPEAK, VANCORANDOM, GENTTROUGH, GENTPEAK, GENTRANDOM, TOBRATROUGH, TOBRAPEAK, TOBRARND, AMIKACINPEAK, AMIKACINTROU, AMIKACIN,  in the last 72 hours   Microbiology: Recent Results (from the past 720 hour(s))  URINE CULTURE     Status: None   Collection Time    06/30/13 12:06 PM      Result Value Range Status   Colony Count NO GROWTH   Final   Organism ID, Bacteria NO GROWTH   Final  INFLUENZA A AND B     Status: None   Collection Time    06/30/13 12:06 PM      Result Value Range Status   Source-INFBD NASAL   Final   Inflenza A Ag NEG  Negative Final   Influenza B Ag NEG  Negative Final  CULTURE, BLOOD (ROUTINE X 2)     Status: None   Collection Time    07/02/13  4:48 PM      Result Value Range Status   Specimen Description BLOOD RIGHT ANTECUBITAL   Final   Special Requests     Final   Value: BOTTLES DRAWN AEROBIC AND ANAEROBIC AEB=11CC ANA=6CC   Culture NO GROWTH 4 DAYS   Final   Report Status PENDING   Incomplete  CULTURE, BLOOD (ROUTINE X 2)     Status: None   Collection Time    07/02/13  4:51 PM      Result Value Range Status   Specimen Description BLOOD LEFT ANTECUBITAL   Final   Special Requests     Final   Value: BOTTLES DRAWN AEROBIC AND ANAEROBIC AEB=12CC  ANA=6CC   Culture NO GROWTH 4 DAYS   Final   Report Status PENDING   Incomplete   Medical History: Past Medical History  Diagnosis Date  . Emphysema   . Allergic rhinitis   . Prostate cancer   . Hypothyroid   . Cataract     s/p removal  . PNA (pneumonia)   . Chronic respiratory failure     oxygen 3L at home   Medications:  Scheduled:  . budesonide-formoterol  2 puff Inhalation BID  . ceFEPime (MAXIPIME) IV  1 g Intravenous Q8H  . doxazosin  8 mg Oral QHS  . enoxaparin (LOVENOX) injection  40 mg Subcutaneous Q24H  . fluticasone  2 spray Each Nare Daily  . ipratropium  0.5 mg Nebulization Q6H  . levalbuterol  0.63 mg Nebulization Q6H  . [START ON 07/07/2013] levothyroxine  75 mcg Oral QAC breakfast  . loratadine  5 mg Oral Daily  . omega-3 acid ethyl esters  2 g Oral Daily  . [START ON 07/12/2013] predniSONE  20 mg Oral QAC breakfast  . vancomycin  750 mg Intravenous Q12H   Assessment: 79yo male admitted with SOB, fever.  CXR shows worsening pna.  Pt has good renal fxn.  Estimated Creatinine Clearance: 61.2 ml/min (by C-G formula based on Cr of 0.76).  Goal of Therapy:  Vancomycin trough level 15-20 mcg/ml  Plan:  Vancomycin 1000mg  IV x 1 (done in ED) then Vancomycin 750mg  IV q12hrs Check trough at steady state Cefepime 2gm IV x 1 (done in ED) then Cefepime 1gm IV q8hrs Monitor labs, renal fxn, and cultures  Valrie Hart A 07/06/2013,1:40 PM

## 2013-07-06 NOTE — Progress Notes (Signed)
UR Chart Review Completed  

## 2013-07-06 NOTE — ED Provider Notes (Addendum)
CSN: [id number]     Arrival date & time 07/06/13  4696 History  This chart was scribed for Donnetta Hutching, MD by Bennett Scrape, ED Scribe. This patient was seen in room [facility]14/[facility]14 and the patient's care was started at 10:34 AM.   Chief Complaint  Patient presents with  . Fever  . Shortness of Breath  . Chest Pain    The history is provided by the patient. No language interpreter was used.   HPI Comments: Level V caveat for urgent need for intervention John Black is a 79 y.o. male who presents to the Emergency Department complaining of right lower CP with associated SOB, fever and weakness that developed today. Fever was 100.7 and pt states that the CP is worse with deep breathing. He was recently discharged from [facility] after hospitalization for PNA in the [facility] 2 days ago with a prescription for prednisone and antibiotics. He states that he felt improved upon discharge but the symptoms gradually worsening over the course of last night into this morning. He states that he has Symbicort and Combivent at home but missed his morning dose of Symbicort and prednisone. He denies having a nebulizer at home. Pt was on 3L [place] at home prior to admission. He is now on 4L [place] O2 at home. He denies smoking.   PCP is Dr. Jeanice Lim with [facility]  Past Medical History  Diagnosis Date  . Emphysema   . Allergic rhinitis   . Prostate cancer   . Hypothyroid   . Cataract     s/p removal   Past Surgical History  Procedure Laterality Date  . Rotator cuff repair  [id number]    bilateral  . Transurethral resection of prostate  2001    x2  . Tonsillectomy    . Adenoidectomy    . Seed implant for prostate cancer  2000   Family History  Problem Relation Age of Onset  . Heart disease Mother   . Prostate cancer Father    History  Substance Use Topics  . Smoking status: Former Smoker -- 1.50 packs/day for 50 years    Types: Cigarettes    Quit date: 09/17/2008  . Smokeless tobacco: Not on file  .  Alcohol Use: 0.6 oz/week    1 Glasses of wine per week     Comment: each evening    Review of Systems  Unable to perform ROS   A complete 10 system review of systems was obtained and all systems are negative except as noted in the HPI and PMH.   Allergies  Morphine  Home Medications   Current Outpatient Rx  Name  Route  Sig  Dispense  Refill  . azithromycin (ZITHROMAX) 500 MG tablet   Oral   Take 1 tablet (500 mg total) by mouth daily. Take each evening around 6 pm. First dose this evening 10/18.   3 tablet   0   . Calcium-Magnesium-Vitamin D (CITRACAL CALCIUM+D PO)      Once a day          . cefUROXime (CEFTIN) 500 MG tablet   Oral   Take 1 tablet (500 mg total) by mouth 2 (two) times daily with a meal.   9 tablet   0   . doxazosin (CARDURA) 8 MG tablet   Oral   Take 8 mg by mouth at bedtime.         . Ipratropium-Albuterol (COMBIVENT) 20-100 MCG/ACT AERS respimat   Inhalation   Inhale 1 puff into  the lungs every 6 (six) hours.   3 Inhaler   4   . levothyroxine (SYNTHROID, LEVOTHROID) 75 MCG tablet   Oral   Take 1 tablet (75 mcg total) by mouth daily.   90 tablet   1   . loratadine (CLARITIN) 5 MG chewable tablet   Oral   Chew 5 mg by mouth daily.         . mometasone (NASONEX) 50 MCG/ACT nasal spray   Nasal   Place 2 sprays into the nose daily.         . Multiple Vitamins-Minerals ([employer] SPECTRAVITE SENIOR PO)      Once a day          . Omega-3 Fatty Acids (FISH OIL PO)   Oral   Take 1,000 Units by mouth daily.          . predniSONE (DELTASONE) 10 MG tablet      10/19: Take 40 mg in the morning oral. 10/20-10/22: Take 20 mg daily in the morning 10/23-10/25: Take 10 mg daily in the morning then stop.   13 tablet   0   . SYMBICORT 160-4.5 MCG/ACT inhaler      INHALE 2 PUFFS INTO THE LUNGS 2 (TWO) TIMES DAILY.   1 Inhaler   3   . zolpidem (AMBIEN) 10 MG tablet      TAKE 1/2 TO 1 TABLET AT BEDTIME AS NEEDED   30 tablet   3     Triage Vitals: Pulse 129  Temp(Src) 99.2 F (37.3 C) (Oral)  Resp 25  Ht 5\' 6"  (1.676 m)  Wt 134 lb (60.782 kg)  BMI 21.64 kg/m2  SpO2 91%  Physical Exam  Nursing note and vitals reviewed. Constitutional: He is oriented to person, place, and time. He appears well-developed and well-nourished.  HENT:  Head: Normocephalic and atraumatic.  Eyes: Conjunctivae and EOM are normal. Pupils are equal, round, and reactive to light.  Neck: Normal range of motion. Neck supple.  Cardiovascular: Regular rhythm and normal heart sounds.  Tachycardia present.   HR 120s  Pulmonary/Chest: Tachypnea noted. He has wheezes (scattered wheezing in the right lung fields).  Decreased breath sounds on the right  Abdominal: Soft. Bowel sounds are normal.  Musculoskeletal: Normal range of motion.  Neurological: He is alert and oriented to person, place, and time.  Skin: Skin is warm and dry.  Psychiatric: He has a normal mood and affect.    ED Course  Procedures (including critical care time)  Medications  ceFEPIme (MAXIPIME) 2 g in dextrose 5 % 50 mL IVPB (not administered)  vancomycin (VANCOCIN) IVPB 1000 mg/200 mL premix (not administered)  albuterol (PROVENTIL) (5 MG/ML) 0.5% nebulizer solution 5 mg (5 mg Nebulization Given 07/06/13 1103)  ipratropium (ATROVENT) nebulizer solution 0.5 mg (0.5 mg Nebulization Given 07/06/13 1103)    DIAGNOSTIC STUDIES: Oxygen Saturation is 90% on 4L [place], adequate by my interpretation.    COORDINATION OF CARE: 10:40 AM-Reviewed CXR in room with the pt which shows worsening RLL PNA. Discussed treatment plan which includes admission with pt at bedside and pt agreed to plan.   11:17 AM-Consult complete with Hospitalist. Patient case explained and discussed. Hospitalist agrees to admit patient to step down for further evaluation and treatment. Call ended at 11:18 AM.  Labs Review Labs Reviewed  CBC WITH DIFFERENTIAL - Abnormal; Notable for the following:    WBC  16.4 (*)    Neutrophils Relative % 84 (*)    Neutro Abs 13.8 (*)  Lymphocytes Relative 8 (*)    Monocytes Absolute 1.2 (*)    All other components within normal limits  BASIC METABOLIC PANEL - Abnormal; Notable for the following:    Sodium 133 (*)    Glucose, Bld 121 (*)    GFR calc non Af Amer 83 (*)    All other components within normal limits  CULTURE, BLOOD (ROUTINE X 2)  CULTURE, BLOOD (ROUTINE X 2)  TROPONIN I   Imaging Review Dg Chest Portable 1 View  07/06/2013   CLINICAL DATA:  Fever and shortness of breath  EXAM: PORTABLE CHEST - 1 VIEW  COMPARISON:  07/02/2013  FINDINGS: Cardiac shadow is stable. The previously seen right-sided infiltrate is again identified and has increased in the interval from the prior exam predominately within the lower lobe. The left lung is clear. No bony abnormality is noted.  IMPRESSION: Increasing right-sided pneumonia.   Electronically Signed   By: Alcide Clever M.D.   On: 07/06/2013 10:17    EKG Interpretation     Ventricular Rate:  122 PR Interval:  136 QRS Duration: 64 QT Interval:  298 QTC Calculation: 424 R Axis:   79 Text Interpretation:  Sinus tachycardia Otherwise normal ECG When compared with ECG of 02-Jul-2013 16:55, Premature atrial complexes are no longer Present            MDM  No diagnosis found. Chest x-ray reveals worsening right-sided pneumonia. IV antibiotics for healthcare associated pneumonia. Albuterol/ Atrovent breathing treatment. Admit to step down.  I personally performed the services described in this documentation, which was scribed in my presence. The recorded information has been reviewed and is accurate.    Donnetta Hutching, MD 07/06/13 1130  Donnetta Hutching, MD 07/17/13 [phone]

## 2013-07-06 NOTE — ED Notes (Signed)
Discharged from [facility] for recent PNA treatment on Saturday.  Developed fever today w/RLL chest pain, worse with deep breathing.

## 2013-07-06 NOTE — H&P (Signed)
[facility] History and Physical  John Black WJX:[id number] DOB: 05-07-1929 DOA: 07/06/2013  Referring physician:  PCP: Milinda Antis, MD  Specialists:   Chief Complaint: sob and right sided chest pain  HPI: John Black is a 79 y.o. male past medical history that includes emphysema, chronic respiratory failure, prostate cancer, thyroid disease, chronic bronchitis presents to the emergency department with the chief complaint of worsening shortness of breath and right-sided chest pain. Patient was discharged 2 days ago after a two-day hospitalization for acute on chronic respiratory failure due to pneumonia. He states he was doing "well" for one day and then developed worsening shortness of breath and right sided CP particularly with cough. Associated symptoms include general weakness with mild joint aches as well as chills with  fever of 100.6. He denies palpitations, abdominal pain, nausea/vomiting. He denies any difficulty swallowing or chewing. He denies dysuria, hematuria, frequency or urgency.  He reports taking oral antibiotic and steroids as prescribed. He reports wearing oxygen 24/7 at 4L since discharge. Evaluation in ED included chest xray yielding worsening right sided pneumonia. Vital signs significant for HR 116, respiratory rate 32, temp 99.2 and sats 91% on 4L. In ED he received vanc and maxipime and nebs. On exam pt hemodynamically stable. [facility] asked to admit.    Review of Systems: 10 point review of systems complete and all negative except as indicated in HPI  Past Medical History  Diagnosis Date  . Emphysema   . Allergic rhinitis   . Prostate cancer   . Hypothyroid   . Cataract     s/p removal  . PNA (pneumonia)   . Chronic respiratory failure     oxygen 3L at home   Past Surgical History  Procedure Laterality Date  . Rotator cuff repair  [id number]    bilateral  . Transurethral resection of prostate  2001    x2  . Tonsillectomy    .  Adenoidectomy    . Seed implant for prostate cancer  2000   Social History:  reports that he quit smoking about 4 years ago. His smoking use included Cigarettes. He has a 75 pack-year smoking history. He does not have any smokeless tobacco history on file. He reports that he drinks about 0.6 ounces of alcohol per week. He reports that he does not use illicit drugs. Lives at home with wife. He is retired Art gallery manager. Independent with ADL  Allergies  Allergen Reactions  . Morphine     REACTION: sweats    Family History  Problem Relation Age of Onset  . Heart disease Mother   . Prostate cancer Father     Prior to Admission medications   Medication Sig Start Date End Date Taking? Authorizing Provider  azithromycin (ZITHROMAX) 500 MG tablet Take 1 tablet (500 mg total) by mouth daily. Take each evening around 6 pm. First dose this evening 10/18. 07/04/13  Yes Standley Brooking, MD  Calcium-Magnesium-Vitamin D (CITRACAL CALCIUM+D PO) Once a day    Yes Historical Provider, MD  cefUROXime (CEFTIN) 500 MG tablet Take 1 tablet (500 mg total) by mouth 2 (two) times daily with a meal. 07/04/13  Yes Standley Brooking, MD  doxazosin (CARDURA) 8 MG tablet Take 8 mg by mouth at bedtime. 06/02/13  Yes Salley Scarlet, MD  Ipratropium-Albuterol (COMBIVENT) 20-100 MCG/ACT AERS respimat Inhale 1 puff into the lungs every 6 (six) hours. 06/26/13  Yes Salley Scarlet, MD  levothyroxine (SYNTHROID, LEVOTHROID) 75 MCG tablet Take 1 tablet (75 mcg  total) by mouth daily. 04/16/13  Yes Salley Scarlet, MD  loratadine (CLARITIN) 5 MG chewable tablet Chew 5 mg by mouth daily.   Yes Historical Provider, MD  mometasone (NASONEX) 50 MCG/ACT nasal spray Place 2 sprays into the nose daily.   Yes Historical Provider, MD  Multiple Vitamins-Minerals ([employer] SPECTRAVITE SENIOR PO) Once a day    Yes Historical Provider, MD  Omega-3 Fatty Acids (FISH OIL PO) Take 1,000 Units by mouth daily.    Yes Historical Provider, MD   predniSONE (DELTASONE) 10 MG tablet 10/19: Take 40 mg in the morning oral. 10/20-10/22: Take 20 mg daily in the morning 10/23-10/25: Take 10 mg daily in the morning then stop. 07/04/13  Yes Standley Brooking, MD  SYMBICORT 160-4.5 MCG/ACT inhaler INHALE 2 PUFFS INTO THE LUNGS 2 (TWO) TIMES DAILY. 05/15/13  Yes Barbaraann Share, MD  zolpidem (AMBIEN) 10 MG tablet TAKE 1/2 TO 1 TABLET AT BEDTIME AS NEEDED 06/26/13  Yes Salley Scarlet, MD   Physical Exam: Filed Vitals:   07/06/13 1130  BP:   Pulse: 110  Temp:   Resp: 27     General:  Thin somewhat frail appearing NAD  Eyes: PERRL EOMI No scleral icterus  ENT: ears clear nose without drainage oropharynx without erythema/exudate. Mucus membranes moist pink  Neck: supple no JVD  Cardiovascular: tachycardic regular no MGR No LE edema  Respiratory: mild increase work of breathing. BS diminished in bases. No wheeze no rhonchi  Abdomen: soft +BS non-tender to palpation  Skin: warm dry slightly pale no rash no lesion  Musculoskeletal: no clubbing no cyanosis  Psychiatric: calm cooperative  Neurologic: cranial nerve II-Xii intact speech clear  Labs on Admission:  Basic Metabolic Panel:  Recent Labs Lab 07/02/13 1223 07/03/13 0500 07/06/13 1011  NA 133* 135 133*  K 4.4 4.9 3.7  CL 97 99 96  CO2 27 26 28   GLUCOSE 120* 176* 121*  BUN 18 18 18   CREATININE 0.95 0.83 0.76  CALCIUM 9.8 9.6 9.2   Liver Function Tests: No results found for this basename: AST, ALT, ALKPHOS, BILITOT, PROT, ALBUMIN,  in the last 168 hours No results found for this basename: LIPASE, AMYLASE,  in the last 168 hours No results found for this basename: AMMONIA,  in the last 168 hours CBC:  Recent Labs Lab 06/30/13 1206 07/02/13 1223 07/03/13 0500 07/06/13 1011  WBC 14.4* 12.6* 10.2 16.4*  NEUTROABS 12.0* 10.5*  --  13.8*  HGB 14.0 13.7 12.3* 13.6  HCT 42.3 39.6 36.0* 39.6  MCV 87.8 86.8 85.9 85.2  PLT 237 231 244 326   Cardiac  Enzymes:  Recent Labs Lab 07/06/13 1011  TROPONINI <0.30    BNP (last 3 results) No results found for this basename: PROBNP,  in the last 8760 hours CBG: No results found for this basename: GLUCAP,  in the last 168 hours  Radiological Exams on Admission: Dg Chest Portable 1 View  07/06/2013   CLINICAL DATA:  Fever and shortness of breath  EXAM: PORTABLE CHEST - 1 VIEW  COMPARISON:  07/02/2013  FINDINGS: Cardiac shadow is stable. The previously seen right-sided infiltrate is again identified and has increased in the interval from the prior exam predominately within the lower lobe. The left lung is clear. No bony abnormality is noted.  IMPRESSION: Increasing right-sided pneumonia.   Electronically Signed   By: Alcide Clever M.D.   On: 07/06/2013 10:17    EKG: Independently reviewed. Sinus tachycardia.  Assessment/Plan Principal  Problem:   Acute-on-chronic respiratory failure: Due to worsening right-sided pneumonia. Will admit to step down. Will provide broad spectrum antibiotics and nebs. Will continue quick prednisone taper. Will support with continuous oxygen and monitor oxygen saturation level closely. Will get CT of the chest as well. Given patient's long history of smoking and persistent worsening respiratory status there is a concern for underlying pathology.  Active Problems:  HCAP (healthcare-associated pneumonia): Worsening. Prior to last hospitalization patient was on Levaquin for 2 days. During his last hospitalization patient was on Rocephin and azithromycin. He was discharged with Ceftin. Cultures obtained in the emergency department. Will check Legionella antigen and strep pneumo. Will also request speech therapy evaluation given persistent right-sided pneumonia. Will also request sputum called  Chest pain: Is likely related to right-sided pneumonia. See problem #2. Initial troponin negative. EKG without any acute changes. Will provide pain med as needed. Will monitor oxygen  saturation level to   Sinus tachycardia: Likely related to nebulizer treatments patient received in the emergency department. Will change nebs to Xopenex. Will monitor closely  COPD (chronic obstructive pulmonary disease) with emphysema: See 1 and #2. Patient is on oxygen at home at 3 L. Prior to his recent hospitalization however patient reports not wearing his oxygen in spite of shortness of breath. Family confirmed. No wheezing on exam. Will continue his quick prednisone taper. Monitor saturation level. Continue home inhalers as well.    Hypothyroidism: TSH 2.7. Will continue home meds    Mild hyperlipidemia: He sent lipid panel yields cholesterol 224 and LDL 135. Will continue home medication    Leukocytosis, unspecified: Most likely related to recent steroid use in the setting of worsening pneumonia. Patient max temp 99.4. Will monitor closely    ALLERGIC RHINITIS: Stable at baseline continue her medication       Anemia: Stable at baseline  Code Status: Full Family Communication: Wife and son at bedside Disposition Plan: Home when the  Time spent: 31 minutes  Gwenyth Bender [facility] Pager [phone]  If 7PM-7AM, please contact night-coverage [email or web address] Password [email or web address] 07/06/2013, 12:20 PM

## 2013-07-06 NOTE — ED Notes (Signed)
Attempted to call report

## 2013-07-06 NOTE — Progress Notes (Signed)
Dr Janee Morn paged. Pt wondering about starting his home med Symbicort. Also pt stated he was having pain and would like some Tylenol but didn't  want anything stronger at this time. Will continue to monitor.

## 2013-07-07 ENCOUNTER — Encounter: Payer: Medicare Other | Admitting: Family Medicine

## 2013-07-07 DIAGNOSIS — R918 Other nonspecific abnormal finding of lung field: Secondary | ICD-10-CM

## 2013-07-07 LAB — URINE CULTURE
Colony Count: NO GROWTH
Culture: NO GROWTH

## 2013-07-07 LAB — CBC WITH DIFFERENTIAL/PLATELET
Basophils Absolute: 0 10*3/uL (ref 0.0–0.1)
Basophils Relative: 0 % (ref 0–1)
Eosinophils Absolute: 0.2 10*3/uL (ref 0.0–0.7)
Eosinophils Relative: 1 % (ref 0–5)
HCT: 38.7 % — ABNORMAL LOW (ref 39.0–52.0)
Hemoglobin: 12.9 g/dL — ABNORMAL LOW (ref 13.0–17.0)
Lymphocytes Relative: 7 % — ABNORMAL LOW (ref 12–46)
Lymphs Abs: 1.2 10*3/uL (ref 0.7–4.0)
MCH: 28.5 pg (ref 26.0–34.0)
MCHC: 33.3 g/dL (ref 30.0–36.0)
MCV: 85.6 fL (ref 78.0–100.0)
Monocytes Absolute: 1 10*3/uL (ref 0.1–1.0)
Monocytes Relative: 6 % (ref 3–12)
Neutro Abs: 14.4 10*3/uL — ABNORMAL HIGH (ref 1.7–7.7)
Neutrophils Relative %: 86 % — ABNORMAL HIGH (ref 43–77)
Platelets: 337 10*3/uL (ref 150–400)
RBC: 4.52 MIL/uL (ref 4.22–5.81)
RDW: 14.7 % (ref 11.5–15.5)
WBC: 16.7 10*3/uL — ABNORMAL HIGH (ref 4.0–10.5)

## 2013-07-07 LAB — BASIC METABOLIC PANEL
BUN: 14 mg/dL (ref 6–23)
CO2: 29 mEq/L (ref 19–32)
Calcium: 8.6 mg/dL (ref 8.4–10.5)
Chloride: 93 mEq/L — ABNORMAL LOW (ref 96–112)
Creatinine, Ser: 0.74 mg/dL (ref 0.50–1.35)
GFR calc Af Amer: >90 mL/min (ref >90)
GFR calc non Af Amer: 84 mL/min — ABNORMAL LOW (ref >90)
Glucose, Bld: 117 mg/dL — ABNORMAL HIGH (ref 70–99)
Potassium: 3.9 mEq/L (ref 3.5–5.1)
Sodium: 130 mEq/L — ABNORMAL LOW (ref 135–145)

## 2013-07-07 LAB — CULTURE, BLOOD (ROUTINE X 2)
Culture: NO GROWTH
Culture: NO GROWTH

## 2013-07-07 LAB — EXPECTORATED SPUTUM ASSESSMENT W GRAM STAIN, RFLX TO RESP C

## 2013-07-07 LAB — STREP PNEUMONIAE URINARY ANTIGEN: Strep Pneumo Urinary Antigen: NEGATIVE

## 2013-07-07 MED ORDER — PIPERACILLIN-TAZOBACTAM 3.375 G IVPB
INTRAVENOUS | Status: AC
  Filled 2013-07-07: qty 100

## 2013-07-07 MED ORDER — PIPERACILLIN-TAZOBACTAM 3.375 G IVPB
3.3750 g | Freq: Three times a day (TID) | INTRAVENOUS | Status: DC
  Administered 2013-07-07 – 2013-07-10 (×9): 3.375 g via INTRAVENOUS
  Filled 2013-07-07 (×19): qty 50

## 2013-07-07 MED ORDER — ZOLPIDEM TARTRATE 5 MG PO TABS: 5.0000 mg | ORAL_TABLET | Freq: Every evening | ORAL | Status: DC | PRN

## 2013-07-07 NOTE — Evaluation (Signed)
Clinical/Bedside Swallow Evaluation  Patient Details  Name: John Black [name]: [id number] Date of Birth: [birth year]  Today's Date: 07/07/2013 Time: 11:50 AM - 12:15 PM    Past Medical History:  Past Medical History  Diagnosis Date  . Emphysema   . Allergic rhinitis   . Prostate cancer   . Hypothyroid   . Cataract     s/p removal  . PNA (pneumonia)   . Chronic respiratory failure     oxygen 3L at home   Past Surgical History:  Past Surgical History  Procedure Laterality Date  . Rotator cuff repair  [id number]    bilateral  . Transurethral resection of prostate  2001    x2  . Tonsillectomy    . Adenoidectomy    . Seed implant for prostate cancer  2000   HPI:  John Black is a 79 y.o. male past medical history that includes emphysema, chronic respiratory failure, prostate cancer, thyroid disease, chronic bronchitis presents to the emergency department with the chief complaint of worsening shortness of breath and right-sided chest pain. Patient was discharged 2 days ago after a two-day hospitalization for acute on chronic respiratory failure due to pneumonia. He states he was doing "well" for one day and then developed worsening shortness of breath and right sided CP particularly with cough. Associated symptoms include general weakness with mild joint aches as well as chills with  fever of 100.6. He denies palpitations, abdominal pain, nausea/vomiting. He denies any difficulty swallowing or chewing. He denies dysuria, hematuria, frequency or urgency.  He reports taking oral antibiotic and steroids as prescribed. He reports wearing oxygen 24/7 at 4L since discharge. Evaluation in ED included chest xray yielding worsening right sided pneumonia. Vital signs significant for HR 116, respiratory rate 32, temp 99.2 and sats 91% on 4L. In ED he received vanc and maxipime and nebs. On exam pt hemodynamically stable.   Assessment / Plan / Recommendation Clinical Impression  Pt has no  outright coughing and oropharyngeal swallow appears functional, however pt does clear his throat throughout the evaluation (he says this started yesterday). Pt feels adamant that his PNA and throat clearing is not related to aspiration. He denies reflux/heartburn. Pt has a diagnosis of COPD which can be concerning for possible silent aspiration. SLP will discuss case with Dr. Irene Limbo and/or Toya Smothers to determine if MBSS would be prudent given persistent right lower lobe pna.    Aspiration Risk  Mild    Diet Recommendation Regular;Thin liquid   Liquid Administration via: Cup;Straw Medication Administration: Whole meds with liquid Supervision: Patient able to self feed Postural Changes and/or Swallow Maneuvers: Seated upright 90 degrees;Upright 30-60 min after meal    Other  Recommendations Oral Care Recommendations: Oral care BID Other Recommendations: Clarify dietary restrictions   Follow Up Recommendations  None    Frequency and Duration min 2x/week  1 week       Swallow Study Prior Functional Status  Cognitive/Linguistic Baseline: Within functional limits Type of Home: House  Lives With: Spouse Available Help at Discharge: Family    General Date of Onset: 07/06/13 HPI: John Black is a 79 y.o. male past medical history that includes emphysema, chronic respiratory failure, prostate cancer, thyroid disease, chronic bronchitis presents to the emergency department with the chief complaint of worsening shortness of breath and right-sided chest pain. Patient was discharged 2 days ago after a two-day hospitalization for acute on chronic respiratory failure due to pneumonia. He states he was doing "well" for one day and  then developed worsening shortness of breath and right sided CP particularly with cough. Associated symptoms include general weakness with mild joint aches as well as chills with  fever of 100.6. He denies palpitations, abdominal pain, nausea/vomiting. He denies any  difficulty swallowing or chewing. He denies dysuria, hematuria, frequency or urgency.  He reports taking oral antibiotic and steroids as prescribed. He reports wearing oxygen 24/7 at 4L since discharge. Evaluation in ED included chest xray yielding worsening right sided pneumonia. Vital signs significant for HR 116, respiratory rate 32, temp 99.2 and sats 91% on 4L. In ED he received vanc and maxipime and nebs. On exam pt hemodynamically stable. Type of Study: Bedside swallow evaluation Previous Swallow Assessment: None on record Diet Prior to this Study: Regular;Thin liquids Temperature Spikes Noted: Yes Respiratory Status: Nasal cannula History of Recent Intubation: No Behavior/Cognition: Alert;Cooperative;Pleasant mood Oral Cavity - Dentition: Edentulous (Has dentures but rarely wears per pt) Self-Feeding Abilities: Able to feed self Patient Positioning: Upright in chair Baseline Vocal Quality: Clear;Hoarse Volitional Cough: Strong Volitional Swallow: Able to elicit    Oral/Motor/Sensory Function Overall Oral Motor/Sensory Function: Appears within functional limits for tasks assessed Labial ROM: Within Functional Limits Labial Symmetry: Within Functional Limits Labial Strength: Within Functional Limits Labial Sensation: Within Functional Limits Lingual ROM: Within Functional Limits Lingual Symmetry: Within Functional Limits Lingual Strength: Within Functional Limits Lingual Sensation: Within Functional Limits Facial ROM: Within Functional Limits Facial Symmetry: Within Functional Limits Facial Strength: Within Functional Limits Facial Sensation: Within Functional Limits Velum: Within Functional Limits Mandible: Within Functional Limits   Ice Chips Ice chips: Within functional limits Presentation: Spoon   Thin Liquid Thin Liquid: Impaired Presentation: Cup;Self Fed;Straw Pharyngeal  Phase Impairments: Throat Clearing - Delayed    Nectar Thick Nectar Thick Liquid: Not tested    Honey Thick Honey Thick Liquid: Not tested   Puree Puree: Within functional limits Presentation: Self Fed;Spoon Other Comments: throat clearing thoughout evaluation   Solid   GO    Solid: Within functional limits      Thank you,  Havery Moros, CCC-SLP [phone]  PORTER,DABNEY 07/07/2013,12:26 PM

## 2013-07-07 NOTE — Progress Notes (Signed)
PT Screen Note  Patient Details Name: John Black [name]: [id number] DOB: [birth year]   Screen Treatment:    Reason Eval/Treat Not Completed: PT screened, no needs identified, will sign off   Gohan Collister, MPT, ATC 07/07/2013, 10:36 AM

## 2013-07-07 NOTE — Progress Notes (Addendum)
.  Patient seen, independently examined and chart reviewed. I agree with exam, assessment and plan discussed with Toya Smothers, NP.  Chart reviewed. Very pleasant 79 year old man who was just hospitalized for community acquired pneumonia and discharged by myself a few days ago. At the time of discharge he was ambulating in the hall without difficulty and had displayed rapid improvement. He had about 36 hours at home when he felt well but then developed fever came back to the hospital and was found to have worsening right-sided pneumonia. Subsequent CT scan demonstrated cavitary pneumonia and a question of tuberculosis was raised.  Today he feels somewhat better, breathing somewhat better. No new issues. He appears calm and comfortable sitting on the side of the bed. No acute distress. Lungs sounds are worse than on discharge with poor air movement but no frank wheezes, rales or rhonchi. He easily speaks in full sentences.  Mild hyponatremia is likely secondary to acute illness. No change in white blood cell count.  Will continue empiric antibiotics now, suspect CA-MRSA pneumonia. He has no swallowing difficulties and is edentulous. No significant alcohol use. Doubt anaerobes as etiology, given clinical improvement, however ST eval in progress and will add anaerobic coverage by changing cefepime to Zosyn. Very much doubt tuberculosis, patient had a chest x-ray January of this year which was unremarkable.  Obtain sputum cultur if possible.  Brendia Sacks, MD [facility] [phone]

## 2013-07-07 NOTE — Progress Notes (Signed)
[facility] PROGRESS NOTE  John Black WGN:[id number] DOB: 11-21-1928 DOA: 07/06/2013 PCP: Milinda Antis, MD  Assessment/Plan: Acute-on-chronic respiratory failure: Due to worsening right-sided pneumonia per xray. Some improvement today. CT chest yields diffuse consolidation right upper lobe with areas of cavitation. These findings are superimposed upon chronic obstructive pulmonary changes and may represent diffuse infectious process. Suspect MRSA pneumonia.Patient is afebrile and non-toxic appearing.  Vancomycin and cefepime day #2. Requiring 5L oxygen [place] to keep sats >90%.  Will continue quick prednisone taper. Will continue oxygen but try to wean from current 5L. Will monitor oxygen saturation level closely. Will transfer to telemetry floor.   Active Problems:  HCAP (healthcare-associated pneumonia): See #1.  Prior to last hospitalization patient was on Levaquin for 2 days. During his last hospitalization patient was on Rocephin and azithromycin. He was discharged with Ceftin. Blood cultures peniding. Legionella antigen in process and strep pneumo urinary antigen negative. Sputum culture pending.  Await speech therapy evaluation given persistent right-sided pneumonia. Patient currently afebrile and non-toxic appearing.    Chest pain: Is likely related to right-sided pneumonia. See problem #2. Initial troponin negative. EKG without any acute changes. Will provide pain med as needed.   Sinus tachycardia: Mild. HR range 86-103.  Likely related to nebulizer treatments. Nebs changed to Xopenex.   Abnormal lung CT: CT yields diffuse consolidation right upper lobe with areas of cavitation may represent diffuse infectious process potentially an atypical  infection such as tuberculosis. Low suspicion for TB. Discussed with ID who recommended AFB x3 and airborne isolation. Will transfer patient to telemetry as well as a negative pressure room for now.   COPD (chronic obstructive pulmonary  disease) with emphysema: See 1 and #2. Patient is on oxygen at home at 3 L. Prior to his recent hospitalization however patient reports not wearing his oxygen in spite of shortness of breath. Family confirmed. No wheezing on exam. Will continue his quick prednisone taper. Monitor saturation level. Continue home inhalers as well.   Hyponatremia: mild. Patient taking po fluids well. Will decrease IV fluids. SBP range 101-122. Recheck in am.  Hypothyroidism: TSH 2.7. Will continue home meds   Mild hyperlipidemia: He sent lipid panel yields cholesterol 224 and LDL 135. Will continue home medication   Leukocytosis, unspecified: Most likely related to recent steroid use in the setting of worsening pneumonia. Patient max temp 99.2.    ALLERGIC RHINITIS: Stable at baseline continue her medication   Anemia: Stable at baseline    Code Status: full Family Communication: none present Disposition Plan: home when ready   Consultants:  none  Procedures:  none  Antibiotics:  Vancomycin 07/06/13>>  cefipime 07/06/13>>>  HPI/Subjective: Sitting on side of bed eating breakfast. Reports feeling better "but very weak. The aches have gone away".  Objective: Filed Vitals:   07/07/13 0730  BP:   Pulse:   Temp: 98.2 F (36.8 C)  Resp:     Intake/Output Summary (Last 24 hours) at 07/07/13 0916 Last data filed at 07/07/13 0500  Gross per 24 hour  Intake 1948.75 ml  Output    350 ml  Net 1598.75 ml   Filed Weights   07/06/13 0942 07/06/13 1400 07/07/13 0500  Weight: 60.782 kg (134 lb) 61.236 kg (135 lb) 61.9 kg (136 lb 7.4 oz)    Exam:   General:  Comfortable, calm NAD  Cardiovascular: RRR No MGR No LE edema  Respiratory: mild increased work of breathing. BS diminished in bilateral based with rhonchi on left. Some tightness and  mild wheeze anterior. No crackles  Abdomen: flat, soft +BS non-tender to palpation   Musculoskeletal: no clubbing or cyanosis.    Data  Reviewed: Basic Metabolic Panel:  Recent Labs Lab 07/02/13 1223 07/03/13 0500 07/06/13 1011 07/06/13 1333 07/07/13 0507  NA 133* 135 133*  --  130*  K 4.4 4.9 3.7  --  3.9  CL 97 99 96  --  93*  CO2 27 26 28   --  29  GLUCOSE 120* 176* 121*  --  117*  BUN 18 18 18   --  14  CREATININE 0.95 0.83 0.76  --  0.74  CALCIUM 9.8 9.6 9.2  --  8.6  MG  --   --   --  1.9  --    Liver Function Tests: No results found for this basename: AST, ALT, ALKPHOS, BILITOT, PROT, ALBUMIN,  in the last 168 hours No results found for this basename: LIPASE, AMYLASE,  in the last 168 hours No results found for this basename: AMMONIA,  in the last 168 hours CBC:  Recent Labs Lab 06/30/13 1206 07/02/13 1223 07/03/13 0500 07/06/13 1011 07/07/13 0507  WBC 14.4* 12.6* 10.2 16.4* 16.7*  NEUTROABS 12.0* 10.5*  --  13.8* 14.4*  HGB 14.0 13.7 12.3* 13.6 12.9*  HCT 42.3 39.6 36.0* 39.6 38.7*  MCV 87.8 86.8 85.9 85.2 85.6  PLT 237 231 244 326 337   Cardiac Enzymes:  Recent Labs Lab 07/06/13 1011  TROPONINI <0.30   BNP (last 3 results) No results found for this basename: PROBNP,  in the last 8760 hours CBG: No results found for this basename: GLUCAP,  in the last 168 hours  Recent Results (from the past 240 hour(s))  URINE CULTURE     Status: None   Collection Time    06/30/13 12:06 PM      Result Value Range Status   Colony Count NO GROWTH   Final   Organism ID, Bacteria NO GROWTH   Final  INFLUENZA A AND B     Status: None   Collection Time    06/30/13 12:06 PM      Result Value Range Status   Source-INFBD NASAL   Final   Inflenza A Ag NEG  Negative Final   Influenza B Ag NEG  Negative Final  CULTURE, BLOOD (ROUTINE X 2)     Status: None   Collection Time    07/02/13  4:48 PM      Result Value Range Status   Specimen Description BLOOD RIGHT ANTECUBITAL   Final   Special Requests     Final   Value: BOTTLES DRAWN AEROBIC AND ANAEROBIC AEB=11CC ANA=6CC   Culture NO GROWTH 4 DAYS    Final   Report Status PENDING   Incomplete  CULTURE, BLOOD (ROUTINE X 2)     Status: None   Collection Time    07/02/13  4:51 PM      Result Value Range Status   Specimen Description BLOOD LEFT ANTECUBITAL   Final   Special Requests     Final   Value: BOTTLES DRAWN AEROBIC AND ANAEROBIC AEB=12CC ANA=6CC   Culture NO GROWTH 4 DAYS   Final   Report Status PENDING   Incomplete  MRSA PCR SCREENING     Status: None   Collection Time    07/06/13  1:35 PM      Result Value Range Status   MRSA by PCR NEGATIVE  NEGATIVE Final   Comment:  The GeneXpert MRSA Assay (FDA     approved for NASAL specimens     only), is one component of a     comprehensive MRSA colonization     surveillance program. It is not     intended to diagnose MRSA     infection nor to guide or     monitor treatment for     MRSA infections.     Studies: Ct Chest W Contrast  07/06/2013   CLINICAL DATA:  Abnormal chest x-ray. Cough.  EXAM: CT CHEST WITH CONTRAST  TECHNIQUE: Multidetector CT imaging of the chest was performed during intravenous contrast administration.  CONTRAST:  80mL OMNIPAQUE IOHEXOL 300 MG/ML  SOLN  COMPARISON:  07/06/2013 and 10/02/2012 chest x-ray. No comparison chest CT.  FINDINGS: Diffuse consolidation right upper lobe with areas of cavitation. These findings are superimposed upon chronic obstructive pulmonary changes and may represent diffuse infectious process potentially an atypical infection such as tuberculosis. Recommend followup until clearance to help exclude underlying malignancy.  Pretracheal adenopathy measures 2.9 x 2 cm maximal dimension.  Pulmonary embolus protocol not utilized. No large central pulmonary embolus.  Mild fatty infiltration of the liver without focal hepatic lesion noted.  Coronary artery calcifications. Heart size top-normal.  Atherosclerotic type changes thoracic aorta with ectasia.  Kyphosis without bony destructive lesion.  IMPRESSION: Diffuse consolidation right  upper lobe with areas of cavitation may represent diffuse infectious process potentially an atypical infection such as tuberculosis. Recommend followup until clearance.  Please see above.   Electronically Signed   By: Bridgett Larsson M.D.   On: 07/06/2013 14:22   Dg Chest Portable 1 View  07/06/2013   CLINICAL DATA:  Fever and shortness of breath  EXAM: PORTABLE CHEST - 1 VIEW  COMPARISON:  07/02/2013  FINDINGS: Cardiac shadow is stable. The previously seen right-sided infiltrate is again identified and has increased in the interval from the prior exam predominately within the lower lobe. The left lung is clear. No bony abnormality is noted.  IMPRESSION: Increasing right-sided pneumonia.   Electronically Signed   By: Alcide Clever M.D.   On: 07/06/2013 10:17    Scheduled Meds: . budesonide-formoterol  2 puff Inhalation BID  . ceFEPime (MAXIPIME) IV  1 g Intravenous Q8H  . doxazosin  8 mg Oral QHS  . enoxaparin (LOVENOX) injection  40 mg Subcutaneous Q24H  . fluticasone  2 spray Each Nare Daily  . levalbuterol  0.63 mg Nebulization TID  . levothyroxine  75 mcg Oral QAC breakfast  . loratadine  5 mg Oral Daily  . omega-3 acid ethyl esters  2 g Oral Daily  . [START ON 07/12/2013] predniSONE  20 mg Oral QAC breakfast  . vancomycin  750 mg Intravenous Q12H   Continuous Infusions: . sodium chloride 75 mL/hr at 07/07/13 0500    Principal Problem:   Acute-on-chronic respiratory failure Active Problems:   ALLERGIC RHINITIS   COPD (chronic obstructive pulmonary disease) with emphysema   Hypothyroidism   Mild hyperlipidemia   Leukocytosis, unspecified   Hyponatremia   Sinus tachycardia   Anemia   HCAP (healthcare-associated pneumonia)   Chest pain   Abnormal CT scan, lung    Time spent: 40 minutes    [facility] M  [facility] Pager [phone]. If 7PM-7AM, please contact night-coverage at [email or web address], password [facility] 07/07/2013, 9:16 AM  LOS: 1 day

## 2013-07-07 NOTE — Progress Notes (Signed)
ANTIBIOTIC CONSULT NOTE  Pharmacy Consult for Vancomycin and Zosyn Indication: pneumonia  Allergies  Allergen Reactions  . Morphine     REACTION: sweats    Patient Measurements: Height: 5\' 5"  (165.1 cm) Weight: 136 lb 7.4 oz (61.9 kg) IBW/kg (Calculated) : 61.5  Vital Signs: Temp: 98 F (36.7 C) (10/21 1457) Temp src: Oral (10/21 1457) BP: 115/58 mmHg (10/21 1457) Pulse Rate: 75 (10/21 1457) Intake/Output from previous day: 10/20 0701 - 10/21 0700 In: 1948.8 [P.O.:560; I.V.:1138.8; IV Piggyback:250] Out: 350 [Urine:350] Intake/Output from this shift: Total I/O In: -  Out: 200 [Urine:200]  Labs:  Recent Labs  07/06/13 1011 07/07/13 0507  WBC 16.4* 16.7*  HGB 13.6 12.9*  PLT 326 337  CREATININE 0.76 0.74   Estimated Creatinine Clearance: 61.9 ml/min (by C-G formula based on Cr of 0.74). No results found for this basename: VANCOTROUGH, Leodis Binet, VANCORANDOM, GENTTROUGH, GENTPEAK, GENTRANDOM, TOBRATROUGH, TOBRAPEAK, TOBRARND, AMIKACINPEAK, AMIKACINTROU, AMIKACIN,  in the last 72 hours   Microbiology: Recent Results (from the past 720 hour(s))  URINE CULTURE     Status: None   Collection Time    06/30/13 12:06 PM      Result Value Range Status   Colony Count NO GROWTH   Final   Organism ID, Bacteria NO GROWTH   Final  INFLUENZA A AND B     Status: None   Collection Time    06/30/13 12:06 PM      Result Value Range Status   Source-INFBD NASAL   Final   Inflenza A Ag NEG  Negative Final   Influenza B Ag NEG  Negative Final  CULTURE, BLOOD (ROUTINE X 2)     Status: None   Collection Time    07/02/13  4:48 PM      Result Value Range Status   Specimen Description BLOOD RIGHT ANTECUBITAL   Final   Special Requests     Final   Value: BOTTLES DRAWN AEROBIC AND ANAEROBIC AEB=11CC ANA=6CC   Culture NO GROWTH 5 DAYS   Final   Report Status 07/07/2013 FINAL   Final  CULTURE, BLOOD (ROUTINE X 2)     Status: None   Collection Time    07/02/13  4:51 PM      Result  Value Range Status   Specimen Description BLOOD LEFT ANTECUBITAL   Final   Special Requests     Final   Value: BOTTLES DRAWN AEROBIC AND ANAEROBIC AEB=12CC ANA=6CC   Culture NO GROWTH 5 DAYS   Final   Report Status 07/07/2013 FINAL   Final  CULTURE, BLOOD (ROUTINE X 2)     Status: None   Collection Time    07/06/13 11:16 AM      Result Value Range Status   Specimen Description BLOOD RIGHT ANTECUBITAL   Final   Special Requests     Final   Value: BOTTLES DRAWN AEROBIC AND ANAEROBIC 10CC  IMMUNE:COMPROMISED   Culture NO GROWTH 1 DAY   Final   Report Status PENDING   Incomplete  CULTURE, BLOOD (ROUTINE X 2)     Status: None   Collection Time    07/06/13 11:17 AM      Result Value Range Status   Specimen Description BLOOD LEFT ANTECUBITAL   Final   Special Requests     Final   Value: BOTTLES DRAWN AEROBIC AND ANAEROBIC AEB=10CC ANA=8CC IMMUNE:COMPROMISED   Culture NO GROWTH 1 DAY   Final   Report Status PENDING   Incomplete  MRSA  PCR SCREENING     Status: None   Collection Time    07/06/13  1:35 PM      Result Value Range Status   MRSA by PCR NEGATIVE  NEGATIVE Final   Comment:            The GeneXpert MRSA Assay (FDA     approved for NASAL specimens     only), is one component of a     comprehensive MRSA colonization     surveillance program. It is not     intended to diagnose MRSA     infection nor to guide or     monitor treatment for     MRSA infections.   Medical History: Past Medical History  Diagnosis Date  . Emphysema   . Allergic rhinitis   . Prostate cancer   . Hypothyroid   . Cataract     s/p removal  . PNA (pneumonia)   . Chronic respiratory failure     oxygen 3L at home   Medications:  Scheduled:  . budesonide-formoterol  2 puff Inhalation BID  . doxazosin  8 mg Oral QHS  . enoxaparin (LOVENOX) injection  40 mg Subcutaneous Q24H  . fluticasone  2 spray Each Nare Daily  . levalbuterol  0.63 mg Nebulization TID  . levothyroxine  75 mcg Oral QAC  breakfast  . loratadine  5 mg Oral Daily  . omega-3 acid ethyl esters  2 g Oral Daily  . piperacillin-tazobactam (ZOSYN)  IV  3.375 g Intravenous Q8H  . [START ON 07/12/2013] predniSONE  20 mg Oral QAC breakfast  . vancomycin  750 mg Intravenous Q12H   Assessment: 79yo male admitted with SOB, fever.  CXR shows worsening PNA.  Pt has good renal fxn.  Broadening coverage to Zosyn for possible aspiration.   Goal of Therapy:  Vancomycin trough level 15-20 mcg/ml  Plan:  Vancomycin 750mg  IV q12hrs Check trough tomorrow Zosyn 3.375gm IV Q8h to be infused over 4hrs Monitor labs, renal fxn, and cultures  Spenser Harren, [facility] 07/07/2013,6:28 PM

## 2013-07-07 NOTE — Progress Notes (Signed)
Subjective:    Patient ID: John Black, male    DOB: [birth year], 79 y.o.   [name]: [id number]  HPI 06/30/13 Patient is an 79 year old white male with a history of prostate cancer who developed a fever to 103 last 24 hours. He reports diffuse myalgias and arthralgias. He also reports mild dysuria and weak urine stream. He denies any cough. He denies any shortness of breath beyond his baseline. He does have severe COPD and emphysema. He denies any travel out of the country. He denies any otalgia, sore throat, sinus pain. He denies any nausea vomiting or diarrhea. He denies any rashes. He denies any recent change in medication.  He has not been bitten by a tick. He is not on any statin medication.  Flu test today in the office is negative. CBC is significant for leukocytosis with white blood cell count greater than 14.  Microscopic urinalysis is significant for white blood cells and few bacteria.  At that time, my plan was:   Prostatitis:differential diagnosis includes pneumonia, prostatitis, or viral syndrome. I believe the patient has prostatitis given his symptoms and abnormal urine microscopic analysis. Start Levaquin 500 mg by mouth daily for 7 days. Bring the patient back in one week to recheck his urine. If symptoms worsen I would obtain a chest x-ray and blood cultures immediately. He is to recheck in 48 hours if no better or sooner if worse.  07/02/13 Over the last 48 hours, the patient developed respiratory symptoms.  Beginning last night, the patient became short of breath, with right lower pleurisy.  He is now hypoxic and working hard to breathe.  He fell last night in bathroom.  He was delirious this am.  He continues to run a low-grade fever.  Office Visit on 06/30/2013  Component Date Value Range Status  . Colony Count 06/30/2013 NO GROWTH   Final  . Organism ID, Bacteria 06/30/2013 NO GROWTH   Final  . WBC 06/30/2013 14.4* 4.0 - 10.5 K/uL Final  . RBC 06/30/2013 4.82  4.22 - 5.81  MIL/uL Final  . Hemoglobin 06/30/2013 14.0  13.0 - 17.0 g/dL Final  . HCT 40/98/1191 42.3  39.0 - 52.0 % Final  . MCV 06/30/2013 87.8  78.0 - 100.0 fL Final  . MCH 06/30/2013 29.0  26.0 - 34.0 pg Final  . MCHC 06/30/2013 33.1  30.0 - 36.0 g/dL Final  . RDW 47/82/9562 14.6  11.5 - 15.5 % Final  . Platelets 06/30/2013 237  150 - 400 K/uL Final  . Neutrophils Relative % 06/30/2013 84* 43 - 77 % Final  . Neutro Abs 06/30/2013 12.0* 1.7 - 7.7 K/uL Final  . Lymphocytes Relative 06/30/2013 8* 12 - 46 % Final  . Lymphs Abs 06/30/2013 1.2  0.7 - 4.0 K/uL Final  . WBC mixed population % 06/30/2013 8  3 - 18 % Final  . WBC mixed population 06/30/2013 1.2  0.1 - 1.8 K/uL Final   Comment: Mixed Population includes all WBC differential cells not classified                          as neutrophils or lymphocytes.  . Color, Urine 06/30/2013 AMBER* YELLOW Final   Biochemicals may be affected by the color of the urine.  Marland Kitchen APPearance 06/30/2013 CLEAR  CLEAR Final  . Specific Gravity, Urine 06/30/2013 1.025  1.005 - 1.030 Final  . pH 06/30/2013 6.0  5.0 - 8.0 Final  . Glucose, UA 06/30/2013 NEG  NEG mg/dL Final  . Bilirubin Urine 06/30/2013 NEG  NEG Final  . Ketones, ur 06/30/2013 NEG  NEG mg/dL Final  . Hgb urine dipstick 06/30/2013 NEG  NEG Final  . Protein, ur 06/30/2013 100* NEG mg/dL Final  . Urobilinogen, UA 06/30/2013 0.2  0.0 - 1.0 mg/dL Final  . Nitrite 47/82/9562 NEG  NEG Final  . Leukocytes, UA 06/30/2013 NEG  NEG Final  . Source-INFBD 06/30/2013 NASAL   Final  . Inflenza A Ag 06/30/2013 NEG  Negative Final  . Influenza B Ag 06/30/2013 NEG  Negative Final  . Squamous Epithelial / LPF 06/30/2013 RARE  RARE Final  . Crystals 06/30/2013 NONE SEEN  NONE SEEN Final  . Casts 06/30/2013 Hyaline casts noted  NONE SEEN Final  . WBC, UA 06/30/2013 3-6* <3 WBC/hpf Final  . RBC / HPF 06/30/2013 0-2  <3 RBC/hpf Final  . Bacteria, UA 06/30/2013 FEW* RARE Final  . Daryll Drown 06/30/2013 MOD MUCUS    Final   At that time, my plan was: 1. COPD exacerbation  2. Acute respiratory failure with hypoxia  3. Dehydration  Patient is clinically dehydrated.  He is hypoxic and has increased work of breathing.  I started the patient on 6 L via a non rebreather mask and was able to obtain an spo2 of 88%.  I sent the patient immediately to the emergency room.  I believe he needs IV fluids, high dose IV steroids, frequent nebulizer treatments, a chest x-ray, and IV antibiotics.  He is clearly unstable to be at home.  The family will transport the patient  to the emergency room immediately. Emergency room was notified.  07/07/13 Patient was admitted to hospital from 10/16-10/18 with RLL PNA.  He was treated with Rocephin, Zithromax, and later transitioned to Ceftin.  He is here today for followup  Past Medical History  Diagnosis Date  . Emphysema   . Allergic rhinitis   . Prostate cancer   . Hypothyroid   . Cataract     s/p removal  . PNA (pneumonia)   . Chronic respiratory failure     oxygen 3L at home   Past Surgical History  Procedure Laterality Date  . Rotator cuff repair  [id number]    bilateral  . Transurethral resection of prostate  2001    x2  . Tonsillectomy    . Adenoidectomy    . Seed implant for prostate cancer  2000   Current Facility-Administered Medications on File Prior to Visit  Medication Dose Route Frequency Provider Last Rate Last Dose  . 0.9 %  sodium chloride infusion   Intravenous Continuous Gwenyth Bender, NP 20 mL/hr at 07/07/13 [phone]    . acetaminophen (TYLENOL) tablet 650 mg  650 mg Oral Q4H PRN Rodolph Bong, MD   650 mg at 07/06/13 1838  . budesonide-formoterol (SYMBICORT) 160-4.5 MCG/ACT inhaler 2 puff  2 puff Inhalation BID Rodolph Bong, MD   2 puff at 07/07/13 [phone]  . ceFEPIme (MAXIPIME) 1 g in dextrose 5 % 50 mL IVPB  1 g Intravenous Q8H Rodolph Bong, MD   1 g at 07/07/13 1140  . doxazosin (CARDURA) tablet 8 mg  8 mg Oral QHS Rodolph Bong, MD   8 mg at 07/06/13 2316  . enoxaparin (LOVENOX) injection 40 mg  40 mg Subcutaneous Q24H Rodolph Bong, MD      . fluticasone [facility]) 50 MCG/ACT nasal spray 2 spray  2 spray Each Nare Daily Rodolph Bong,  MD      . levalbuterol Pauline Aus) nebulizer solution 0.63 mg  0.63 mg Nebulization Q2H PRN Rodolph Bong, MD   0.63 mg at 07/07/13 0321  . levalbuterol (XOPENEX) nebulizer solution 0.63 mg  0.63 mg Nebulization TID Adolm Joseph, RRT   0.63 mg at 07/07/13 1334  . levothyroxine (SYNTHROID, LEVOTHROID) tablet 75 mcg  75 mcg Oral QAC breakfast Rodolph Bong, MD   75 mcg at 07/07/13 [phone]  . loratadine (CLARITIN) tablet 5 mg  5 mg Oral Daily Rodolph Bong, MD      . omega-3 acid ethyl esters (LOVAZA) capsule 2 g  2 g Oral Daily Rodolph Bong, MD      . oxyCODONE-acetaminophen (PERCOCET/ROXICET) 5-325 MG per tablet 1 tablet  1 tablet Oral Q6H PRN Leanne Chang, NP   1 tablet at 07/07/13 1138  . [START ON 07/12/2013] predniSONE (DELTASONE) tablet 20 mg  20 mg Oral QAC breakfast Rodolph Bong, MD      . vancomycin (VANCOCIN) IVPB 750 mg/150 ml premix  750 mg Intravenous Q12H Rodolph Bong, MD   750 mg at 07/07/13 1308  . zolpidem (AMBIEN) tablet 5 mg  5 mg Oral QHS PRN Rodolph Bong, MD   5 mg at 07/06/13 2316   Current Outpatient Prescriptions on File Prior to Visit  Medication Sig Dispense Refill  . azithromycin (ZITHROMAX) 500 MG tablet Take 1 tablet (500 mg total) by mouth daily. Take each evening around 6 pm. First dose this evening 10/18.  3 tablet  0  . Calcium-Magnesium-Vitamin D (CITRACAL CALCIUM+D PO) Once a day       . cefUROXime (CEFTIN) 500 MG tablet Take 1 tablet (500 mg total) by mouth 2 (two) times daily with a meal.  9 tablet  0  . doxazosin (CARDURA) 8 MG tablet Take 8 mg by mouth at bedtime.      . Ipratropium-Albuterol (COMBIVENT) 20-100 MCG/ACT AERS respimat Inhale 1 puff into the lungs every 6 (six) hours.  3  Inhaler  4  . levothyroxine (SYNTHROID, LEVOTHROID) 75 MCG tablet Take 1 tablet (75 mcg total) by mouth daily.  90 tablet  1  . loratadine (CLARITIN) 5 MG chewable tablet Chew 5 mg by mouth daily.      . mometasone (NASONEX) 50 MCG/ACT nasal spray Place 2 sprays into the nose daily.      . Multiple Vitamins-Minerals ([employer] SPECTRAVITE SENIOR PO) Once a day       . Omega-3 Fatty Acids (FISH OIL PO) Take 1,000 Units by mouth daily.       . predniSONE (DELTASONE) 10 MG tablet 10/19: Take 40 mg in the morning oral. 10/20-10/22: Take 20 mg daily in the morning 10/23-10/25: Take 10 mg daily in the morning then stop.  13 tablet  0  . SYMBICORT 160-4.5 MCG/ACT inhaler INHALE 2 PUFFS INTO THE LUNGS 2 (TWO) TIMES DAILY.  1 Inhaler  3  . zolpidem (AMBIEN) 10 MG tablet TAKE 1/2 TO 1 TABLET AT BEDTIME AS NEEDED  30 tablet  3   Allergies  Allergen Reactions  . Morphine     REACTION: sweats   History   Social History  . Marital Status: Married    Spouse Name: N/A    Number of Children: N/A  . Years of Education: N/A   Occupational History  . engineer    Social History Main Topics  . Smoking status: Former Smoker -- 1.50 packs/day for 50 years  Types: Cigarettes    Quit date: 09/17/2008  . Smokeless tobacco: Not on file  . Alcohol Use: 0.6 oz/week    1 Glasses of wine per week     Comment: each evening  . Drug Use: No  . Sexual Activity: Not on file   Other Topics Concern  . Not on file   Social History Narrative  . No narrative on file      Review of Systems  All other systems reviewed and are negative.       Objective:   Physical Exam  Vitals reviewed. Constitutional: He is oriented to person, place, and time. He appears well-developed and well-nourished. No distress.  HENT:  Right Ear: External ear normal.  Left Ear: External ear normal.  Nose: Nose normal.  Mouth/Throat: Oropharynx is clear and moist. No oropharyngeal exudate.  Eyes: Conjunctivae are normal. Pupils  are equal, round, and reactive to light. No scleral icterus.  Neck: Normal range of motion. Neck supple. No JVD present. No thyromegaly present.  Cardiovascular: Normal rate, regular rhythm and normal heart sounds.   No murmur heard. Pulmonary/Chest: No accessory muscle usage. Not tachypneic. No respiratory distress. He has no decreased breath sounds. He has no wheezes. He has no rhonchi.  Abdominal: Soft. Bowel sounds are normal. He exhibits no distension. There is no tenderness. There is no rebound and no guarding.  Musculoskeletal: He exhibits no edema.  Lymphadenopathy:    He has no cervical adenopathy.  Neurological: He is alert and oriented to person, place, and time. He has normal reflexes. No cranial nerve deficit. Coordination normal.  Skin: Skin is warm. No rash noted. He is not diaphoretic. No erythema.          Assessment & Plan:   This encounter was created in error - please disregard.

## 2013-07-07 NOTE — Progress Notes (Signed)
Patient refused lovenox this afternoon.  Explained to patient why this was ordered and why we use it to prevent blood clots at the hospital.  Asked him if he would use SCDs as an alternative, but refuses this also.  Patient states he is fine as is.  Encouraged him to get up and walk frequently in room.  Pt verbalizes understanding.  MD paged to make aware of patients refusal

## 2013-07-08 DIAGNOSIS — J438 Other emphysema: Secondary | ICD-10-CM

## 2013-07-08 LAB — LEGIONELLA ANTIGEN, URINE: Legionella Antigen, Urine: NEGATIVE

## 2013-07-08 LAB — BASIC METABOLIC PANEL
BUN: 11 mg/dL (ref 6–23)
CO2: 32 mEq/L (ref 19–32)
Calcium: 8.7 mg/dL (ref 8.4–10.5)
Chloride: 96 mEq/L (ref 96–112)
Creatinine, Ser: 0.75 mg/dL (ref 0.50–1.35)
GFR calc Af Amer: >90 mL/min (ref >90)
GFR calc non Af Amer: 83 mL/min — ABNORMAL LOW (ref >90)
Glucose, Bld: 96 mg/dL (ref 70–99)
Potassium: 4 mEq/L (ref 3.5–5.1)
Sodium: 133 mEq/L — ABNORMAL LOW (ref 135–145)

## 2013-07-08 LAB — VANCOMYCIN, TROUGH: Vancomycin Tr: 8.4 ug/mL — ABNORMAL LOW (ref 10.0–20.0)

## 2013-07-08 MED ORDER — METHYLPREDNISOLONE SODIUM SUCC 40 MG IJ SOLR
40.0000 mg | Freq: Four times a day (QID) | INTRAMUSCULAR | Status: DC
  Administered 2013-07-08 – 2013-07-09 (×4): 40 mg via INTRAVENOUS
  Filled 2013-07-08 (×4): qty 1

## 2013-07-08 MED ORDER — VANCOMYCIN HCL IN DEXTROSE 750-5 MG/150ML-% IV SOLN
750.0000 mg | Freq: Three times a day (TID) | INTRAVENOUS | Status: DC
  Administered 2013-07-08 – 2013-07-11 (×9): 750 mg via INTRAVENOUS
  Filled 2013-07-08 (×17): qty 150

## 2013-07-08 MED ORDER — TRAZODONE HCL 50 MG PO TABS
25.0000 mg | ORAL_TABLET | Freq: Every evening | ORAL | Status: DC | PRN
  Administered 2013-07-08 – 2013-07-10 (×3): 25 mg via ORAL
  Filled 2013-07-08 (×2): qty 1

## 2013-07-08 NOTE — Progress Notes (Signed)
Speech Language Pathology Treatment:    Patient Details Name: John Black [name]: [id number] DOB: Apr 12, 1929 Today's Date: 07/08/2013 Time: 1324-4010    Assessment / Plan / Recommendation Clinical Impression  Pt observed finishing his breakfast this AM. Pt demonstrated no overt s/sx of oral or pharyngeal dysphagia upon observation. He additionally reports no difficulty prior to SLP entrance. Recommend cont with regular diet as pt seems to be tolerating very well. Safety strategies reviewed with pt.    HPI HPI: John Black is a 79 y.o. male past medical history that includes emphysema, chronic respiratory failure, prostate cancer, thyroid disease, chronic bronchitis presents to the emergency department with the chief complaint of worsening shortness of breath and right-sided chest pain. Patient was discharged 2 days ago after a two-day hospitalization for acute on chronic respiratory failure due to pneumonia. He states he was doing "well" for one day and then developed worsening shortness of breath and right sided CP particularly with cough. Associated symptoms include general weakness with mild joint aches as well as chills with  fever of 100.6. He denies palpitations, abdominal pain, nausea/vomiting. He denies any difficulty swallowing or chewing. He denies dysuria, hematuria, frequency or urgency.  He reports taking oral antibiotic and steroids as prescribed. He reports wearing oxygen 24/7 at 4L since discharge. Evaluation in ED included chest xray yielding worsening right sided pneumonia. Vital signs significant for HR 116, respiratory rate 32, temp 99.2 and sats 91% on 4L. In ED he received vanc and maxipime and nebs. On exam pt hemodynamically stable.      SLP Plan  Continue with current plan of care    Recommendations Medication Administration: Whole meds with liquid Supervision: Patient able to self feed Postural Changes and/or Swallow Maneuvers: Seated upright 90 degrees;Upright  30-60 min after meal              Oral Care Recommendations: Oral care BID Plan: Continue with current plan of care    GO     John Black S 07/08/2013, 9:25 AM

## 2013-07-08 NOTE — Progress Notes (Signed)
ANTIBIOTIC CONSULT NOTE  Pharmacy Consult for Vancomycin and Zosyn Indication: pneumonia  Allergies  Allergen Reactions  . Morphine     REACTION: sweats   Patient Measurements: Height: 5\' 5"  (165.1 cm) Weight: 136 lb 7.4 oz (61.9 kg) IBW/kg (Calculated) : 61.5  Vital Signs: Temp: 98.1 F (36.7 C) (10/22 0613) Temp src: Oral (10/22 0613) BP: 105/54 mmHg (10/22 0613) Pulse Rate: 115 (10/22 0613) Intake/Output from previous day: 10/21 0701 - 10/22 0700 In: 1300.8 [P.O.:240; I.V.:660.8; IV Piggyback:400] Out: 1250 [Urine:1250] Intake/Output from this shift:    Labs:  Recent Labs  07/06/13 1011 07/07/13 0507 07/08/13 0530  WBC 16.4* 16.7*  --   HGB 13.6 12.9*  --   PLT 326 337  --   CREATININE 0.76 0.74 0.75   Estimated Creatinine Clearance: 61.9 ml/min (by C-G formula based on Cr of 0.75).  Recent Labs  07/08/13 0913  VANCOTROUGH 8.4*    Microbiology: Recent Results (from the past 720 hour(s))  URINE CULTURE     Status: None   Collection Time    06/30/13 12:06 PM      Result Value Range Status   Colony Count NO GROWTH   Final   Organism ID, Bacteria NO GROWTH   Final  INFLUENZA A AND B     Status: None   Collection Time    06/30/13 12:06 PM      Result Value Range Status   Source-INFBD NASAL   Final   Inflenza A Ag NEG  Negative Final   Influenza B Ag NEG  Negative Final  CULTURE, BLOOD (ROUTINE X 2)     Status: None   Collection Time    07/02/13  4:48 PM      Result Value Range Status   Specimen Description BLOOD RIGHT ANTECUBITAL   Final   Special Requests     Final   Value: BOTTLES DRAWN AEROBIC AND ANAEROBIC AEB=11CC ANA=6CC   Culture NO GROWTH 5 DAYS   Final   Report Status 07/07/2013 FINAL   Final  CULTURE, BLOOD (ROUTINE X 2)     Status: None   Collection Time    07/02/13  4:51 PM      Result Value Range Status   Specimen Description BLOOD LEFT ANTECUBITAL   Final   Special Requests     Final   Value: BOTTLES DRAWN AEROBIC AND ANAEROBIC  AEB=12CC ANA=6CC   Culture NO GROWTH 5 DAYS   Final   Report Status 07/07/2013 FINAL   Final  CULTURE, BLOOD (ROUTINE X 2)     Status: None   Collection Time    07/06/13 11:16 AM      Result Value Range Status   Specimen Description BLOOD RIGHT ANTECUBITAL   Final   Special Requests     Final   Value: BOTTLES DRAWN AEROBIC AND ANAEROBIC 10CC  IMMUNE:COMPROMISED   Culture NO GROWTH 1 DAY   Final   Report Status PENDING   Incomplete  CULTURE, BLOOD (ROUTINE X 2)     Status: None   Collection Time    07/06/13 11:17 AM      Result Value Range Status   Specimen Description BLOOD LEFT ANTECUBITAL   Final   Special Requests     Final   Value: BOTTLES DRAWN AEROBIC AND ANAEROBIC AEB=10CC ANA=8CC IMMUNE:COMPROMISED   Culture NO GROWTH 1 DAY   Final   Report Status PENDING   Incomplete  URINE CULTURE     Status: None  Collection Time    07/06/13 12:10 PM      Result Value Range Status   Specimen Description URINE, CLEAN CATCH   Final   Special Requests NONE   Final   Culture  Setup Time     Final   Value: 07/07/2013 00:51     Performed at [facility] Count     Final   Value: NO GROWTH     Performed at [facility]   Culture     Final   Value: NO GROWTH     Performed at [facility]   Report Status 07/07/2013 FINAL   Final  MRSA PCR SCREENING     Status: None   Collection Time    07/06/13  1:35 PM      Result Value Range Status   MRSA by PCR NEGATIVE  NEGATIVE Final   Comment:            The GeneXpert MRSA Assay (FDA     approved for NASAL specimens     only), is one component of a     comprehensive MRSA colonization     surveillance program. It is not     intended to diagnose MRSA     infection nor to guide or     monitor treatment for     MRSA infections.  CULTURE, EXPECTORATED SPUTUM-ASSESSMENT     Status: None   Collection Time    07/07/13 11:20 PM      Result Value Range Status   Specimen Description SPUTUM   Final   Special  Requests NONE   Final   Sputum evaluation     Final   Value: THIS SPECIMEN IS ACCEPTABLE. RESPIRATORY CULTURE REPORT TO FOLLOW.   Report Status 07/07/2013 FINAL   Final   Medical History: Past Medical History  Diagnosis Date  . Emphysema   . Allergic rhinitis   . Prostate cancer   . Hypothyroid   . Cataract     s/p removal  . PNA (pneumonia)   . Chronic respiratory failure     oxygen 3L at home   Medications:  Scheduled:  . budesonide-formoterol  2 puff Inhalation BID  . doxazosin  8 mg Oral QHS  . enoxaparin (LOVENOX) injection  40 mg Subcutaneous Q24H  . fluticasone  2 spray Each Nare Daily  . levalbuterol  0.63 mg Nebulization TID  . levothyroxine  75 mcg Oral QAC breakfast  . loratadine  5 mg Oral Daily  . methylPREDNISolone (SOLU-MEDROL) injection  40 mg Intravenous Q6H  . omega-3 acid ethyl esters  2 g Oral Daily  . piperacillin-tazobactam (ZOSYN)  IV  3.375 g Intravenous Q8H  . vancomycin  750 mg Intravenous Q8H   Assessment: 79yo male admitted with SOB, fever.  CXR shows worsening PNA.  Pt has good renal fxn.  Broadening coverage to Zosyn for possible aspiration.  Afebrile.  Trough level is below goal.  Estimated Creatinine Clearance: 61.9 ml/min (by C-G formula based on Cr of 0.75).  Vancomycin clearance is better than predicted.  Kinetic calculations for new dose: Give Vancomycin 750 mg IV q 8 hrs. Expected Cpeak: ~30  mcg/ml    Expected Ctrough: ~ 15 mcg/ml  Goal of Therapy:  Vancomycin trough level 15-20 mcg/ml  Plan:  Increase Vancomycin to 750mg  IV q8hrs Re-check trough at steady state Zosyn 3.375gm IV Q8h to be infused over 4hrs Monitor labs, renal fxn, and cultures  Valrie Hart A 07/08/2013,10:59  AM    

## 2013-07-08 NOTE — Progress Notes (Signed)
I have seen and assessed patient and agree with Karen Black,NP assessment and plan. 

## 2013-07-08 NOTE — Progress Notes (Signed)
[facility] PROGRESS NOTE  John Black ZOX:[id number] DOB: 04-21-1929 DOA: 07/06/2013 PCP: Milinda Antis, MD  Assessment/Plan: Acute-on-chronic respiratory failure: Due to worsening right-sided pneumonia per xray.  Little improvement today. CT chest yields diffuse consolidation right upper lobe with areas of cavitation. These findings are superimposed upon chronic obstructive pulmonary changes and may represent diffuse infectious process. Suspect MRSA pneumonia.Patient remains afebrile and non-toxic appearing. Vancomycin and cefepime day #3. Requiring 6L oxygen [place] to keep sats >90%. Will start solumedrol. Will continue oxygen but try to wean from current 6L. Will monitor oxygen saturation level closely.   Active Problems:   HCAP (healthcare-associated pneumonia): See #1. Prior to last hospitalization patient was on Levaquin for 2 days. During his last hospitalization patient was on Rocephin and azithromycin. He was discharged with Ceftin. Blood cultures no growth to date. Legionella antigen in process and strep pneumo urinary antigen negative. Sputum culture pending. Speech therapy evaluation yields no aspiration. Patient remains afebrile and non-toxic appearing.   Chest pain: Is likely related to right-sided pneumonia.Resolved. See problem #2. Initial troponin negative. EKG without any acute changes. Will provide pain med as needed.   Sinus tachycardia: Mild. HR range 86-103. Likely related to nebulizer treatments. Nebs changed to Xopenex.   Abnormal lung CT: CT yields diffuse consolidation right upper lobe with areas of cavitation may represent diffuse infectious process potentially an atypical  infection such as tuberculosis. Low suspicion for TB. Discussed with ID who recommended AFB x3 and airborne isolation. AFB in process. Little sputum production   COPD (chronic obstructive pulmonary disease) with emphysema: See 1 and #2. Patient is on oxygen at home at 3 L. Prior to his  recent hospitalization however patient reports not wearing his oxygen in spite of shortness of breath. Family confirmed. Mild wheeze. Will add solumedrol. Monitor saturation level. Continue home inhalers as well.   Hyponatremia: improved. Patient taking po fluids well. Will decrease IV fluids.    Hypothyroidism: TSH 2.7. Will continue home meds   Mild hyperlipidemia: He sent lipid panel yields cholesterol 224 and LDL 135. Will continue home medication   Leukocytosis, unspecified: Most likely related to recent steroid use in the setting of worsening pneumonia. Stable. Monitor   ALLERGIC RHINITIS: Stable at baseline continue her medication  Anemia: Stable at baseline     Code Status: full Family Communication: none present Disposition Plan: home when ready   Consultants:  none  Procedures:  none  Antibiotics: Vancomycin 07/06/13>>  cefipime 07/06/13>>>   HPI/Subjective: Up in chair. Somewhat agitated. Denies pain/discomfort. Reports "drainage in back of my throat".   Objective: Filed Vitals:   07/08/13 0613  BP: 105/54  Pulse: 115  Temp: 98.1 F (36.7 C)  Resp: 20    Intake/Output Summary (Last 24 hours) at 07/08/13 1006 Last data filed at 07/08/13 0445  Gross per 24 hour  Intake 1300.75 ml  Output   1050 ml  Net 250.75 ml   Filed Weights   07/06/13 0942 07/06/13 1400 07/07/13 0500  Weight: 60.782 kg (134 lb) 61.236 kg (135 lb) 61.9 kg (136 lb 7.4 oz)    Exam:   General:  Comfortable NAD  Cardiovascular: RRR No MGR No LE edema  Respiratory: mild increased work of breathing. BS with improved air flow. Faint wheeze mid lobe on left. No crackles  Abdomen: soft +BS non-tender to palpation  Musculoskeletal: no clubbing no cyanosis   Data Reviewed: Basic Metabolic Panel:  Recent Labs Lab 07/02/13 1223 07/03/13 0500 07/06/13 1011 07/06/13 1333 07/07/13 0507  07/08/13 0530  NA 133* 135 133*  --  130* 133*  K 4.4 4.9 3.7  --  3.9 4.0  CL 97 99  96  --  93* 96  CO2 27 26 28   --  29 32  GLUCOSE 120* 176* 121*  --  117* 96  BUN 18 18 18   --  14 11  CREATININE 0.95 0.83 0.76  --  0.74 0.75  CALCIUM 9.8 9.6 9.2  --  8.6 8.7  MG  --   --   --  1.9  --   --    Liver Function Tests: No results found for this basename: AST, ALT, ALKPHOS, BILITOT, PROT, ALBUMIN,  in the last 168 hours No results found for this basename: LIPASE, AMYLASE,  in the last 168 hours No results found for this basename: AMMONIA,  in the last 168 hours CBC:  Recent Labs Lab 07/02/13 1223 07/03/13 0500 07/06/13 1011 07/07/13 0507  WBC 12.6* 10.2 16.4* 16.7*  NEUTROABS 10.5*  --  13.8* 14.4*  HGB 13.7 12.3* 13.6 12.9*  HCT 39.6 36.0* 39.6 38.7*  MCV 86.8 85.9 85.2 85.6  PLT 231 244 326 337   Cardiac Enzymes:  Recent Labs Lab 07/06/13 1011  TROPONINI <0.30   BNP (last 3 results) No results found for this basename: PROBNP,  in the last 8760 hours CBG: No results found for this basename: GLUCAP,  in the last 168 hours  Recent Results (from the past 240 hour(s))  URINE CULTURE     Status: None   Collection Time    06/30/13 12:06 PM      Result Value Range Status   Colony Count NO GROWTH   Final   Organism ID, Bacteria NO GROWTH   Final  INFLUENZA A AND B     Status: None   Collection Time    06/30/13 12:06 PM      Result Value Range Status   Source-INFBD NASAL   Final   Inflenza A Ag NEG  Negative Final   Influenza B Ag NEG  Negative Final  CULTURE, BLOOD (ROUTINE X 2)     Status: None   Collection Time    07/02/13  4:48 PM      Result Value Range Status   Specimen Description BLOOD RIGHT ANTECUBITAL   Final   Special Requests     Final   Value: BOTTLES DRAWN AEROBIC AND ANAEROBIC AEB=11CC ANA=6CC   Culture NO GROWTH 5 DAYS   Final   Report Status 07/07/2013 FINAL   Final  CULTURE, BLOOD (ROUTINE X 2)     Status: None   Collection Time    07/02/13  4:51 PM      Result Value Range Status   Specimen Description BLOOD LEFT ANTECUBITAL    Final   Special Requests     Final   Value: BOTTLES DRAWN AEROBIC AND ANAEROBIC AEB=12CC ANA=6CC   Culture NO GROWTH 5 DAYS   Final   Report Status 07/07/2013 FINAL   Final  CULTURE, BLOOD (ROUTINE X 2)     Status: None   Collection Time    07/06/13 11:16 AM      Result Value Range Status   Specimen Description BLOOD RIGHT ANTECUBITAL   Final   Special Requests     Final   Value: BOTTLES DRAWN AEROBIC AND ANAEROBIC 10CC  IMMUNE:COMPROMISED   Culture NO GROWTH 1 DAY   Final   Report Status PENDING   Incomplete  CULTURE,  BLOOD (ROUTINE X 2)     Status: None   Collection Time    07/06/13 11:17 AM      Result Value Range Status   Specimen Description BLOOD LEFT ANTECUBITAL   Final   Special Requests     Final   Value: BOTTLES DRAWN AEROBIC AND ANAEROBIC AEB=10CC ANA=8CC IMMUNE:COMPROMISED   Culture NO GROWTH 1 DAY   Final   Report Status PENDING   Incomplete  URINE CULTURE     Status: None   Collection Time    07/06/13 12:10 PM      Result Value Range Status   Specimen Description URINE, CLEAN CATCH   Final   Special Requests NONE   Final   Culture  Setup Time     Final   Value: 07/07/2013 00:51     Performed at [facility] Count     Final   Value: NO GROWTH     Performed at [facility]   Culture     Final   Value: NO GROWTH     Performed at [facility]   Report Status 07/07/2013 FINAL   Final  MRSA PCR SCREENING     Status: None   Collection Time    07/06/13  1:35 PM      Result Value Range Status   MRSA by PCR NEGATIVE  NEGATIVE Final   Comment:            The GeneXpert MRSA Assay (FDA     approved for NASAL specimens     only), is one component of a     comprehensive MRSA colonization     surveillance program. It is not     intended to diagnose MRSA     infection nor to guide or     monitor treatment for     MRSA infections.  CULTURE, EXPECTORATED SPUTUM-ASSESSMENT     Status: None   Collection Time    07/07/13 11:20 PM       Result Value Range Status   Specimen Description SPUTUM   Final   Special Requests NONE   Final   Sputum evaluation     Final   Value: THIS SPECIMEN IS ACCEPTABLE. RESPIRATORY CULTURE REPORT TO FOLLOW.   Report Status 07/07/2013 FINAL   Final     Studies: Ct Chest W Contrast  07/06/2013   CLINICAL DATA:  Abnormal chest x-ray. Cough.  EXAM: CT CHEST WITH CONTRAST  TECHNIQUE: Multidetector CT imaging of the chest was performed during intravenous contrast administration.  CONTRAST:  80mL OMNIPAQUE IOHEXOL 300 MG/ML  SOLN  COMPARISON:  07/06/2013 and 10/02/2012 chest x-ray. No comparison chest CT.  FINDINGS: Diffuse consolidation right upper lobe with areas of cavitation. These findings are superimposed upon chronic obstructive pulmonary changes and may represent diffuse infectious process potentially an atypical infection such as tuberculosis. Recommend followup until clearance to help exclude underlying malignancy.  Pretracheal adenopathy measures 2.9 x 2 cm maximal dimension.  Pulmonary embolus protocol not utilized. No large central pulmonary embolus.  Mild fatty infiltration of the liver without focal hepatic lesion noted.  Coronary artery calcifications. Heart size top-normal.  Atherosclerotic type changes thoracic aorta with ectasia.  Kyphosis without bony destructive lesion.  IMPRESSION: Diffuse consolidation right upper lobe with areas of cavitation may represent diffuse infectious process potentially an atypical infection such as tuberculosis. Recommend followup until clearance.  Please see above.   Electronically Signed   By: Kandice Hams.D.  On: 07/06/2013 14:22    Scheduled Meds: . budesonide-formoterol  2 puff Inhalation BID  . doxazosin  8 mg Oral QHS  . enoxaparin (LOVENOX) injection  40 mg Subcutaneous Q24H  . fluticasone  2 spray Each Nare Daily  . levalbuterol  0.63 mg Nebulization TID  . levothyroxine  75 mcg Oral QAC breakfast  . loratadine  5 mg Oral Daily  . omega-3 acid  ethyl esters  2 g Oral Daily  . piperacillin-tazobactam (ZOSYN)  IV  3.375 g Intravenous Q8H  . [START ON 07/12/2013] predniSONE  20 mg Oral QAC breakfast  . vancomycin  750 mg Intravenous Q12H   Continuous Infusions: . sodium chloride 20 mL/hr at 07/07/13 1610    Principal Problem:   Acute-on-chronic respiratory failure Active Problems:   ALLERGIC RHINITIS   COPD (chronic obstructive pulmonary disease) with emphysema   Hypothyroidism   Mild hyperlipidemia   Leukocytosis, unspecified   Hyponatremia   Sinus tachycardia   Anemia   HCAP (healthcare-associated pneumonia)   Chest pain   Abnormal CT scan, lung    Time spent: 35 minutes    [facility] M  [facility] Pager [phone]. If 7PM-7AM, please contact night-coverage at [email or web address], password [email or web address] 07/08/2013, 10:06 AM  LOS: 2 days

## 2013-07-09 LAB — CBC
HCT: 35.9 % — ABNORMAL LOW (ref 39.0–52.0)
Hemoglobin: 12.1 g/dL — ABNORMAL LOW (ref 13.0–17.0)
MCH: 29.2 pg (ref 26.0–34.0)
MCHC: 33.7 g/dL (ref 30.0–36.0)
MCV: 86.5 fL (ref 78.0–100.0)
Platelets: 398 10*3/uL (ref 150–400)
RBC: 4.15 MIL/uL — ABNORMAL LOW (ref 4.22–5.81)
RDW: 14.9 % (ref 11.5–15.5)
WBC: 18 10*3/uL — ABNORMAL HIGH (ref 4.0–10.5)

## 2013-07-09 LAB — BASIC METABOLIC PANEL
BUN: 15 mg/dL (ref 6–23)
CO2: 32 mEq/L (ref 19–32)
Calcium: 9.2 mg/dL (ref 8.4–10.5)
Chloride: 95 mEq/L — ABNORMAL LOW (ref 96–112)
Creatinine, Ser: 0.73 mg/dL (ref 0.50–1.35)
GFR calc Af Amer: >90 mL/min (ref >90)
GFR calc non Af Amer: 84 mL/min — ABNORMAL LOW (ref >90)
Glucose, Bld: 166 mg/dL — ABNORMAL HIGH (ref 70–99)
Potassium: 4.3 mEq/L (ref 3.5–5.1)
Sodium: 134 mEq/L — ABNORMAL LOW (ref 135–145)

## 2013-07-09 MED ORDER — METHYLPREDNISOLONE SODIUM SUCC 40 MG IJ SOLR: 40.0000 mg | Freq: Three times a day (TID) | INTRAMUSCULAR | Status: DC

## 2013-07-09 MED ORDER — METHYLPREDNISOLONE SODIUM SUCC 40 MG IJ SOLR
40.0000 mg | Freq: Two times a day (BID) | INTRAMUSCULAR | Status: DC
  Administered 2013-07-09 – 2013-07-10 (×3): 40 mg via INTRAVENOUS
  Filled 2013-07-09 (×4): qty 1

## 2013-07-09 MED ORDER — FUROSEMIDE 10 MG/ML IJ SOLN
40.0000 mg | Freq: Once | INTRAMUSCULAR | Status: AC
  Administered 2013-07-09: 40 mg via INTRAVENOUS
  Filled 2013-07-09: qty 4

## 2013-07-09 NOTE — Progress Notes (Signed)
[facility] PROGRESS NOTE  John Black ZOX:[id number] DOB: [birth year] DOA: 07/06/2013 PCP: Milinda Antis, MD  Assessment/Plan: Acute-on-chronic respiratory failure: Due to worsening right-sided pneumonia per xray.  CT chest on admission yields diffuse consolidation right upper lobe with areas of cavitation. These findings are superimposed upon chronic obstructive pulmonary changes and may represent diffuse infectious process. Suspect MRSA pneumonia. Improving very slowly. Patient remains afebrile and non-toxic appearing. Vancomycin and cefepime day #4. Requiring 5L oxygen [place] to keep sats >90%. Tapering solumedrol today.  Will continue oxygen but try to wean from current 5L. Will monitor oxygen saturation level closely.   Active Problems:  HCAP (healthcare-associated pneumonia): See #1. Prior to last hospitalization patient was on Levaquin for 2 days. During his last hospitalization patient was on Rocephin and azithromycin. He was discharged with Ceftin. Blood cultures no growth to date. Legionella antigen and strep pneumo urinary antigen negative. Sputum culture pending. Speech therapy evaluation yields no aspiration. Patient remains afebrile and non-toxic appearing.   Chest pain: Is likely related to right-sided pneumonia.Resolved. See problem #2. Initial troponin negative. EKG without any acute changes. Will provide pain med as needed.   Sinus tachycardia: Mild. Improved. Likely related to nebulizer treatments. Nebs changed to Xopenex.   Abnormal lung CT: CT yields diffuse consolidation right upper lobe with areas of cavitation may represent diffuse infectious process potentially an atypical infection such as tuberculosis. Low suspicion for TB. Discussed with ID who recommended AFB x3 and airborne isolation. AFB in process. Little sputum production   COPD (chronic obstructive pulmonary disease) with emphysema: See 1 and #2. Patient is on oxygen at home at 3 L. Prior to his recent  hospitalization however patient reports not wearing his oxygen in spite of shortness of breath. Family confirmed. Improved air flow. Will  wean solumedrol. Monitor saturation level. Continue home inhalers as well.   Hyponatremia: continues to improve.   LE edema: bilateral feet with 1+pitting edema. Very fine crackles lung bases.  Volume status + 1.8L. Will give 1 dose lasix. Monitor intake and output.   Hypothyroidism: TSH 2.7. Will continue home meds   Mild hyperlipidemia: He sent lipid panel yields cholesterol 224 and LDL 135. Will continue home medication   Leukocytosis, unspecified: Most likely related to recent steroid use in the setting of worsening pneumonia. Stable. Monitor   ALLERGIC RHINITIS: Stable at baseline continue her medication  Anemia: Stable at baseline    Code Status: full Family Communication: wife at bedside Disposition Plan: home when ready. Hopefully 24-48 hours   Consultants:  none  Procedures:  none  Antibiotics: Vancomycin 07/06/13>>  cefipime 07/06/13>>>   HPI/Subjective: Sitting in chair conversing with wife. Denies pain/discomfort. States breathing better than i was yesterday.   Objective: Filed Vitals:   07/09/13 0555  BP: 93/38  Pulse: 64  Temp: 97.5 F (36.4 C)  Resp: 22    Intake/Output Summary (Last 24 hours) at 07/09/13 1304 Last data filed at 07/09/13 0941  Gross per 24 hour  Intake    600 ml  Output    525 ml  Net     75 ml   Filed Weights   07/06/13 0942 07/06/13 1400 07/07/13 0500  Weight: 60.782 kg (134 lb) 61.236 kg (135 lb) 61.9 kg (136 lb 7.4 oz)    Exam:   General:  Thin somewhat frail. NAD   Cardiovascular: RRR No MGR 1+pitting edema bilateral feet  Respiratory: normal effort at rest and with conversation. Good airflow. No wheeze fine crackles bilateral bases  Abdomen: round slightly firm, non-tender to palpation. +BS   Musculoskeletal: no clubbing no cyanosis   Data Reviewed: Basic Metabolic  Panel:  Recent Labs Lab 07/03/13 0500 07/06/13 1011 07/06/13 1333 07/07/13 0507 07/08/13 0530 07/09/13 0630  NA 135 133*  --  130* 133* 134*  K 4.9 3.7  --  3.9 4.0 4.3  CL 99 96  --  93* 96 95*  CO2 26 28  --  29 32 32  GLUCOSE 176* 121*  --  117* 96 166*  BUN 18 18  --  14 11 15   CREATININE 0.83 0.76  --  0.74 0.75 0.73  CALCIUM 9.6 9.2  --  8.6 8.7 9.2  MG  --   --  1.9  --   --   --    Liver Function Tests: No results found for this basename: AST, ALT, ALKPHOS, BILITOT, PROT, ALBUMIN,  in the last 168 hours No results found for this basename: LIPASE, AMYLASE,  in the last 168 hours No results found for this basename: AMMONIA,  in the last 168 hours CBC:  Recent Labs Lab 07/03/13 0500 07/06/13 1011 07/07/13 0507 07/09/13 0630  WBC 10.2 16.4* 16.7* 18.0*  NEUTROABS  --  13.8* 14.4*  --   HGB 12.3* 13.6 12.9* 12.1*  HCT 36.0* 39.6 38.7* 35.9*  MCV 85.9 85.2 85.6 86.5  PLT 244 326 337 398   Cardiac Enzymes:  Recent Labs Lab 07/06/13 1011  TROPONINI <0.30   BNP (last 3 results) No results found for this basename: PROBNP,  in the last 8760 hours CBG: No results found for this basename: GLUCAP,  in the last 168 hours  Recent Results (from the past 240 hour(s))  URINE CULTURE     Status: None   Collection Time    06/30/13 12:06 PM      Result Value Range Status   Colony Count NO GROWTH   Final   Organism ID, Bacteria NO GROWTH   Final  INFLUENZA A AND B     Status: None   Collection Time    06/30/13 12:06 PM      Result Value Range Status   Source-INFBD NASAL   Final   Inflenza A Ag NEG  Negative Final   Influenza B Ag NEG  Negative Final  CULTURE, BLOOD (ROUTINE X 2)     Status: None   Collection Time    07/02/13  4:48 PM      Result Value Range Status   Specimen Description BLOOD RIGHT ANTECUBITAL   Final   Special Requests     Final   Value: BOTTLES DRAWN AEROBIC AND ANAEROBIC AEB=11CC ANA=6CC   Culture NO GROWTH 5 DAYS   Final   Report Status  07/07/2013 FINAL   Final  CULTURE, BLOOD (ROUTINE X 2)     Status: None   Collection Time    07/02/13  4:51 PM      Result Value Range Status   Specimen Description BLOOD LEFT ANTECUBITAL   Final   Special Requests     Final   Value: BOTTLES DRAWN AEROBIC AND ANAEROBIC AEB=12CC ANA=6CC   Culture NO GROWTH 5 DAYS   Final   Report Status 07/07/2013 FINAL   Final  CULTURE, BLOOD (ROUTINE X 2)     Status: None   Collection Time    07/06/13 11:16 AM      Result Value Range Status   Specimen Description BLOOD RIGHT ANTECUBITAL   Final  Special Requests     Final   Value: BOTTLES DRAWN AEROBIC AND ANAEROBIC 10CC  IMMUNE:COMPROMISED   Culture NO GROWTH 3 DAYS   Final   Report Status PENDING   Incomplete  CULTURE, BLOOD (ROUTINE X 2)     Status: None   Collection Time    07/06/13 11:17 AM      Result Value Range Status   Specimen Description BLOOD LEFT ANTECUBITAL   Final   Special Requests     Final   Value: BOTTLES DRAWN AEROBIC AND ANAEROBIC AEB=10CC ANA=8CC IMMUNE:COMPROMISED   Culture NO GROWTH 3 DAYS   Final   Report Status PENDING   Incomplete  URINE CULTURE     Status: None   Collection Time    07/06/13 12:10 PM      Result Value Range Status   Specimen Description URINE, CLEAN CATCH   Final   Special Requests NONE   Final   Culture  Setup Time     Final   Value: 07/07/2013 00:51     Performed at [facility] Count     Final   Value: NO GROWTH     Performed at [facility]   Culture     Final   Value: NO GROWTH     Performed at [facility]   Report Status 07/07/2013 FINAL   Final  MRSA PCR SCREENING     Status: None   Collection Time    07/06/13  1:35 PM      Result Value Range Status   MRSA by PCR NEGATIVE  NEGATIVE Final   Comment:            The GeneXpert MRSA Assay (FDA     approved for NASAL specimens     only), is one component of a     comprehensive MRSA colonization     surveillance program. It is not     intended to  diagnose MRSA     infection nor to guide or     monitor treatment for     MRSA infections.  CULTURE, EXPECTORATED SPUTUM-ASSESSMENT     Status: None   Collection Time    07/07/13 11:20 PM      Result Value Range Status   Specimen Description SPUTUM   Final   Special Requests NONE   Final   Sputum evaluation     Final   Value: THIS SPECIMEN IS ACCEPTABLE. RESPIRATORY CULTURE REPORT TO FOLLOW.   Report Status 07/07/2013 FINAL   Final  CULTURE, RESPIRATORY (NON-EXPECTORATED)     Status: None   Collection Time    07/07/13 11:20 PM      Result Value Range Status   Specimen Description SPUTUM EXPECTORATED   Final   Special Requests NONE   Final   Gram Stain     Final   Value: ABUNDANT WBC PRESENT,BOTH PMN AND MONONUCLEAR     RARE SQUAMOUS EPITHELIAL CELLS PRESENT     RARE YEAST     Performed at [facility]   Culture     Final   Value: MODERATE CANDIDA ALBICANS     Performed at [facility]   Report Status PENDING   Incomplete     Studies: No results found.  Scheduled Meds: . budesonide-formoterol  2 puff Inhalation BID  . doxazosin  8 mg Oral QHS  . enoxaparin (LOVENOX) injection  40 mg Subcutaneous Q24H  . fluticasone  2 spray Each  Nare Daily  . furosemide  40 mg Intravenous Once  . levalbuterol  0.63 mg Nebulization TID  . levothyroxine  75 mcg Oral QAC breakfast  . loratadine  5 mg Oral Daily  . methylPREDNISolone (SOLU-MEDROL) injection  40 mg Intravenous Q12H  . omega-3 acid ethyl esters  2 g Oral Daily  . piperacillin-tazobactam (ZOSYN)  IV  3.375 g Intravenous Q8H  . vancomycin  750 mg Intravenous Q8H   Continuous Infusions:   Principal Problem:   Acute-on-chronic respiratory failure Active Problems:   ALLERGIC RHINITIS   COPD (chronic obstructive pulmonary disease) with emphysema   Hypothyroidism   Mild hyperlipidemia   Leukocytosis, unspecified   Hyponatremia   Sinus tachycardia   Anemia   HCAP (healthcare-associated pneumonia)   Chest  pain   Abnormal CT scan, lung    Time spent: 30  minutes    [facility] M  [facility] Pager [phone]. If 7PM-7AM, please contact night-coverage at [email or web address], password [facility] 07/09/2013, 1:04 PM  LOS: 3 days

## 2013-07-09 NOTE — Progress Notes (Signed)
I have seen and assessed patient and agree with Toya Smothers, nurse practitioner's assessment and plan. Patient was admitted with pneumonia per chest x-ray. CT findings showed consolidation in the right upper lobe with some areas of cavitation. It was felt patient's pneumonia was likely a community acquired MRSA pneumonia. Patient has improved clinically. A&P cultures are pending. First set up preliminary AFB cultures have been negative. Patient has improved clinically. Will continue empiric IV antibiotics, steroid taper, oxygen. Follow.

## 2013-07-10 LAB — CBC
HCT: 33.1 % — ABNORMAL LOW (ref 39.0–52.0)
Hemoglobin: 11.1 g/dL — ABNORMAL LOW (ref 13.0–17.0)
MCH: 29.1 pg (ref 26.0–34.0)
MCHC: 33.5 g/dL (ref 30.0–36.0)
MCV: 86.6 fL (ref 78.0–100.0)
Platelets: 422 10*3/uL — ABNORMAL HIGH (ref 150–400)
RBC: 3.82 MIL/uL — ABNORMAL LOW (ref 4.22–5.81)
RDW: 14.8 % (ref 11.5–15.5)
WBC: 18.8 10*3/uL — ABNORMAL HIGH (ref 4.0–10.5)

## 2013-07-10 LAB — BASIC METABOLIC PANEL
BUN: 25 mg/dL — ABNORMAL HIGH (ref 6–23)
CO2: 35 mEq/L — ABNORMAL HIGH (ref 19–32)
Calcium: 8.8 mg/dL (ref 8.4–10.5)
Chloride: 95 mEq/L — ABNORMAL LOW (ref 96–112)
Creatinine, Ser: 0.89 mg/dL (ref 0.50–1.35)
GFR calc Af Amer: 90 mL/min — ABNORMAL LOW (ref >90)
GFR calc non Af Amer: 77 mL/min — ABNORMAL LOW (ref >90)
Glucose, Bld: 137 mg/dL — ABNORMAL HIGH (ref 70–99)
Potassium: 4.2 mEq/L (ref 3.5–5.1)
Sodium: 137 mEq/L (ref 135–145)

## 2013-07-10 LAB — CULTURE, RESPIRATORY W GRAM STAIN

## 2013-07-10 LAB — VANCOMYCIN, TROUGH: Vancomycin Tr: 18.3 ug/mL (ref 10.0–20.0)

## 2013-07-10 MED ORDER — METHYLPREDNISOLONE SODIUM SUCC 40 MG IJ SOLR
40.0000 mg | Freq: Every day | INTRAMUSCULAR | Status: DC
  Filled 2013-07-10: qty 1

## 2013-07-10 MED ORDER — SODIUM CHLORIDE 0.9 % IV SOLN: INTRAVENOUS | Status: AC

## 2013-07-10 NOTE — Progress Notes (Signed)
If patient has 2 AFB sputum specimens are negative, than he can be discontinued from airborne isolation. Low risk for mTb. Other explanation for cavitary lesions -> bacterial necrotizing pneumonia.  Duke Salvia Drue Second MD MPH [facility] [phone]

## 2013-07-10 NOTE — Care Management Note (Addendum)
    Page 1 of 1   07/10/2013     4:41:59 PM   CARE MANAGEMENT NOTE 07/10/2013  Patient:  John Black,John Black   Account Number:  [id number]  Date Initiated:  07/10/2013  Documentation initiated by:  Rosemary Holms  Subjective/Objective Assessment:   pt form home with spouse. Plans to return home, no HH /DME needs anticipated     Action/Plan:   Anticipated DC Date:     Anticipated DC Plan:  HOME/SELF CARE         Choice offered to / List presented to:          [facility] arranged  HH-1 RN      [facility] agency  [facility]   Status of service:  Completed, signed off Medicare Important Message given?   (If response is "NO", the following Medicare IM given date fields will be blank) Date Medicare IM given:   Date Additional Medicare IM given:    Discharge Disposition:  HOME W HOME HEALTH SERVICES  Per UR Regulation:    If discussed at Long Length of Stay Meetings, dates discussed:    Comments:  07/10/13 Rosemary Holms RN BSN CM 16:30 Pt to have PICC line placed tomorrow and DC'd home for IVAB. Pt or his wife will be able to work with [facility]. [facility] notified of pending DC. RN Tamika notifed that [facility] will need to  be called with Vanc dosage. Tamika will coordinate with [facility] to arrange 1st home dose.

## 2013-07-10 NOTE — Progress Notes (Signed)
[facility] PROGRESS NOTE  John Black ZOX:[id number] DOB: 1929-01-06 DOA: 07/06/2013 PCP: Milinda Antis, MD  Assessment/Plan: Acute-on-chronic respiratory failure: Due to worsening right-sided pneumonia per xray. CT chest on admission yields diffuse consolidation right upper lobe with areas of cavitation. These findings are superimposed upon chronic obstructive pulmonary changes and may represent diffuse infectious process. Suspect MRSA pneumonia. Continues with slow improvement.  Patient remains afebrile and non-toxic appearing. Vancomycin and cefepime day #5. Requiring 3L oxygen [place] to keep sats Between 89-90%. Continue to taper solumedrol. Will continue oxygen but try to wean from current 3L. Will monitor oxygen saturation level closely. Will ambulate and document sats as well.   Active Problems:  HCAP (healthcare-associated pneumonia): See #1. Prior to last hospitalization patient was on Levaquin for 2 days. During his last hospitalization patient was on Rocephin and azithromycin. He was discharged with Ceftin. Blood cultures no growth to date. Legionella antigen and strep pneumo urinary antigen negative. Sputum culture pending. Speech therapy evaluation yields no aspiration. Patient remains afebrile and non-toxic appearing.   Chest pain: Is likely related to right-sided pneumonia.Resolved. See problem #2. Initial troponin negative. EKG without any acute changes. Will provide pain med as needed.   Sinus tachycardia: Resolved. Likely related to nebulizer treatments. Nebs changed to Xopenex.   Abnormal lung CT: CT yields diffuse consolidation right upper lobe with areas of cavitation may represent diffuse infectious process potentially an atypical infection such as tuberculosis. Low suspicion for TB. Discussed with ID who recommended AFB x3 and airborne isolation. AFB #1 prelim report negative. Will discuss with ID. Little sputum production   COPD (chronic obstructive pulmonary  disease) with emphysema: See 1 and #2. Patient is on oxygen at home at 3 L. Prior to his recent hospitalization however patient reports not wearing his oxygen in spite of shortness of breath. Family confirmed. Improved air flow. Will wean solumedrol. Monitor saturation level. Continue home inhalers as well.   Hyponatremia: resolved   LE edema: bilateral feet with trace - 1+pitting edema. Slight improvement from yesterday.  Volume status + 1.1L. Voided 2.2L yesterday after 1 dose lasix. Continue intake and output.   Hypothyroidism: TSH 2.7. Will continue home meds   Mild hyperlipidemia: He sent lipid panel yields cholesterol 224 and LDL 135. Will continue home medication   Leukocytosis, unspecified: Most likely related to recent steroid use in the setting of worsening pneumonia. Stable. Monitor   ALLERGIC RHINITIS: Stable at baseline continue her medication  Anemia: Stable at baseline  Code Status: full Family Communication: none present Disposition Plan: home hopefully 24-36 hours   Consultants:  none  Procedures:  none  Antibiotics:  Vancomycin 07/06/13>>>  cefipime 07/06/13 >>>   HPI/Subjective: Up in chair eating. Denies pain. Reports respirations close to baseline  Objective: Filed Vitals:   07/10/13 0700  BP: 111/51  Pulse: 71  Temp: 97.9 F (36.6 C)  Resp: 20    Intake/Output Summary (Last 24 hours) at 07/10/13 1257 Last data filed at 07/10/13 0900  Gross per 24 hour  Intake   1650 ml  Output   2275 ml  Net   -625 ml   Filed Weights   07/06/13 0942 07/06/13 1400 07/07/13 0500  Weight: 60.782 kg (134 lb) 61.236 kg (135 lb) 61.9 kg (136 lb 7.4 oz)    Exam:   General:  Well nourished. ND  Cardiovascular: RRR no MGR trace-1+ LE edema   Respiratory: normal effort at rest. Mild increased work of breathing with conversation. Air flow remains fair.  Abdomen: flat soft +BS non-tender to palpation  Musculoskeletal: no clubbing no cyanosis   Data  Reviewed: Basic Metabolic Panel:  Recent Labs Lab 07/06/13 1011 07/06/13 1333 07/07/13 0507 07/08/13 0530 07/09/13 0630 07/10/13 0606  NA 133*  --  130* 133* 134* 137  K 3.7  --  3.9 4.0 4.3 4.2  CL 96  --  93* 96 95* 95*  CO2 28  --  29 32 32 35*  GLUCOSE 121*  --  117* 96 166* 137*  BUN 18  --  14 11 15  25*  CREATININE 0.76  --  0.74 0.75 0.73 0.89  CALCIUM 9.2  --  8.6 8.7 9.2 8.8  MG  --  1.9  --   --   --   --    Liver Function Tests: No results found for this basename: AST, ALT, ALKPHOS, BILITOT, PROT, ALBUMIN,  in the last 168 hours No results found for this basename: LIPASE, AMYLASE,  in the last 168 hours No results found for this basename: AMMONIA,  in the last 168 hours CBC:  Recent Labs Lab 07/06/13 1011 07/07/13 0507 07/09/13 0630 07/10/13 0606  WBC 16.4* 16.7* 18.0* 18.8*  NEUTROABS 13.8* 14.4*  --   --   HGB 13.6 12.9* 12.1* 11.1*  HCT 39.6 38.7* 35.9* 33.1*  MCV 85.2 85.6 86.5 86.6  PLT 326 337 398 422*   Cardiac Enzymes:  Recent Labs Lab 07/06/13 1011  TROPONINI <0.30   BNP (last 3 results) No results found for this basename: PROBNP,  in the last 8760 hours CBG: No results found for this basename: GLUCAP,  in the last 168 hours  Recent Results (from the past 240 hour(s))  CULTURE, BLOOD (ROUTINE X 2)     Status: None   Collection Time    07/02/13  4:48 PM      Result Value Range Status   Specimen Description BLOOD RIGHT ANTECUBITAL   Final   Special Requests     Final   Value: BOTTLES DRAWN AEROBIC AND ANAEROBIC AEB=11CC ANA=6CC   Culture NO GROWTH 5 DAYS   Final   Report Status 07/07/2013 FINAL   Final  CULTURE, BLOOD (ROUTINE X 2)     Status: None   Collection Time    07/02/13  4:51 PM      Result Value Range Status   Specimen Description BLOOD LEFT ANTECUBITAL   Final   Special Requests     Final   Value: BOTTLES DRAWN AEROBIC AND ANAEROBIC AEB=12CC ANA=6CC   Culture NO GROWTH 5 DAYS   Final   Report Status 07/07/2013 FINAL    Final  CULTURE, BLOOD (ROUTINE X 2)     Status: None   Collection Time    07/06/13 11:16 AM      Result Value Range Status   Specimen Description BLOOD RIGHT ANTECUBITAL   Final   Special Requests     Final   Value: BOTTLES DRAWN AEROBIC AND ANAEROBIC 10CC  IMMUNE:COMPROMISED   Culture NO GROWTH 4 DAYS   Final   Report Status PENDING   Incomplete  CULTURE, BLOOD (ROUTINE X 2)     Status: None   Collection Time    07/06/13 11:17 AM      Result Value Range Status   Specimen Description BLOOD LEFT ANTECUBITAL   Final   Special Requests     Final   Value: BOTTLES DRAWN AEROBIC AND ANAEROBIC AEB=10CC ANA=8CC IMMUNE:COMPROMISED   Culture NO GROWTH 4 DAYS  Final   Report Status PENDING   Incomplete  URINE CULTURE     Status: None   Collection Time    07/06/13 12:10 PM      Result Value Range Status   Specimen Description URINE, CLEAN CATCH   Final   Special Requests NONE   Final   Culture  Setup Time     Final   Value: 07/07/2013 00:51     Performed at [facility] Count     Final   Value: NO GROWTH     Performed at [facility]   Culture     Final   Value: NO GROWTH     Performed at [facility]   Report Status 07/07/2013 FINAL   Final  MRSA PCR SCREENING     Status: None   Collection Time    07/06/13  1:35 PM      Result Value Range Status   MRSA by PCR NEGATIVE  NEGATIVE Final   Comment:            The GeneXpert MRSA Assay (FDA     approved for NASAL specimens     only), is one component of a     comprehensive MRSA colonization     surveillance program. It is not     intended to diagnose MRSA     infection nor to guide or     monitor treatment for     MRSA infections.  CULTURE, EXPECTORATED SPUTUM-ASSESSMENT     Status: None   Collection Time    07/07/13 11:20 PM      Result Value Range Status   Specimen Description SPUTUM   Final   Special Requests NONE   Final   Sputum evaluation     Final   Value: THIS SPECIMEN IS ACCEPTABLE.  RESPIRATORY CULTURE REPORT TO FOLLOW.   Report Status 07/07/2013 FINAL   Final  AFB CULTURE WITH SMEAR     Status: None   Collection Time    07/07/13 11:20 PM      Result Value Range Status   Specimen Description SPUTUM EXPECTORATED   Final   Special Requests NONE   Final   ACID FAST SMEAR     Final   Value: NO ACID FAST BACILLI SEEN     Performed at [facility]   Culture     Final   Value: CULTURE WILL BE EXAMINED FOR 6 WEEKS BEFORE ISSUING A FINAL REPORT     Performed at [facility]   Report Status PENDING   Incomplete  CULTURE, RESPIRATORY (NON-EXPECTORATED)     Status: None   Collection Time    07/07/13 11:20 PM      Result Value Range Status   Specimen Description SPUTUM EXPECTORATED   Final   Special Requests NONE   Final   Gram Stain     Final   Value: ABUNDANT WBC PRESENT,BOTH PMN AND MONONUCLEAR     RARE SQUAMOUS EPITHELIAL CELLS PRESENT     RARE YEAST     Performed at [facility]   Culture     Final   Value: MODERATE CANDIDA ALBICANS     Performed at [facility]   Report Status 07/10/2013 FINAL   Final     Studies: No results found.  Scheduled Meds: . budesonide-formoterol  2 puff Inhalation BID  . doxazosin  8 mg Oral QHS  . enoxaparin (LOVENOX) injection  40 mg  Subcutaneous Q24H  . fluticasone  2 spray Each Nare Daily  . levalbuterol  0.63 mg Nebulization TID  . levothyroxine  75 mcg Oral QAC breakfast  . loratadine  5 mg Oral Daily  . [START ON 07/11/2013] methylPREDNISolone (SOLU-MEDROL) injection  40 mg Intravenous Daily  . omega-3 acid ethyl esters  2 g Oral Daily  . piperacillin-tazobactam (ZOSYN)  IV  3.375 g Intravenous Q8H  . vancomycin  750 mg Intravenous Q8H   Continuous Infusions: . sodium chloride 75 mL/hr (07/10/13 1215)    Principal Problem:   Acute-on-chronic respiratory failure Active Problems:   ALLERGIC RHINITIS   COPD (chronic obstructive pulmonary disease) with emphysema   Hypothyroidism    Mild hyperlipidemia   Leukocytosis, unspecified   Hyponatremia   Sinus tachycardia   Anemia   HCAP (healthcare-associated pneumonia)   Chest pain   Abnormal CT scan, lung    Time spent: 30 minutes    [facility] M  [facility] Pager [phone]. If 7PM-7AM, please contact night-coverage at [email or web address], password [facility] 07/10/2013, 12:57 PM  LOS: 4 days

## 2013-07-10 NOTE — Progress Notes (Addendum)
Patient ambulated around the nurses desk with 2 Liters Nasal Cannula.  No noted SOB or weakness.

## 2013-07-10 NOTE — Progress Notes (Signed)
I have seen and assessed patient and agree with John Black, nurse practitioner's assessment and plan. Patient presented with acute on chronic respiratory failure secondary to worsening right-sided pneumonia per chest x-ray after recent discharge. CT of the chest did show some cavitary lesions. Due to concern for TB patient was placed in isolation and AFB cultures were obtained. Patient AFB sputum cultures have been negative x2. Doubt the patient has TB. Possibility cavitary lesions could be a necrotizing pneumonia. Patient has improved clinically on IV vancomycin and Zosyn. Will discontinue Zosyn. Discontinue airborne isolation. Will treat patient empirically has a necrotizing pneumonia. Patient will require 2 weeks of IV vancomycin. Will place a PICC line. Follow.

## 2013-07-11 ENCOUNTER — Inpatient Hospital Stay (HOSPITAL_COMMUNITY): Payer: Medicare Other

## 2013-07-11 ENCOUNTER — Encounter (HOSPITAL_COMMUNITY): Payer: Medicare Other

## 2013-07-11 DIAGNOSIS — E039 Hypothyroidism, unspecified: Secondary | ICD-10-CM

## 2013-07-11 DIAGNOSIS — J852 Abscess of lung without pneumonia: Principal | ICD-10-CM

## 2013-07-11 LAB — CBC
HCT: 33.3 % — ABNORMAL LOW (ref 39.0–52.0)
Hemoglobin: 11.2 g/dL — ABNORMAL LOW (ref 13.0–17.0)
MCH: 29.1 pg (ref 26.0–34.0)
MCHC: 33.6 g/dL (ref 30.0–36.0)
MCV: 86.5 fL (ref 78.0–100.0)
Platelets: 442 10*3/uL — ABNORMAL HIGH (ref 150–400)
RBC: 3.85 MIL/uL — ABNORMAL LOW (ref 4.22–5.81)
RDW: 14.9 % (ref 11.5–15.5)
WBC: 18.2 10*3/uL — ABNORMAL HIGH (ref 4.0–10.5)

## 2013-07-11 LAB — BASIC METABOLIC PANEL
BUN: 24 mg/dL — ABNORMAL HIGH (ref 6–23)
CO2: 34 mEq/L — ABNORMAL HIGH (ref 19–32)
Calcium: 8.8 mg/dL (ref 8.4–10.5)
Chloride: 96 mEq/L (ref 96–112)
Creatinine, Ser: 0.79 mg/dL (ref 0.50–1.35)
GFR calc Af Amer: >90 mL/min (ref >90)
GFR calc non Af Amer: 81 mL/min — ABNORMAL LOW (ref >90)
Glucose, Bld: 131 mg/dL — ABNORMAL HIGH (ref 70–99)
Potassium: 4.1 mEq/L (ref 3.5–5.1)
Sodium: 135 mEq/L (ref 135–145)

## 2013-07-11 MED ORDER — SODIUM CHLORIDE 0.9 % IJ SOLN
10.0000 mL | Freq: Two times a day (BID) | INTRAMUSCULAR | Status: DC
  Administered 2013-07-11: 10 mL

## 2013-07-11 MED ORDER — VANCOMYCIN HCL IN DEXTROSE 1-5 GM/200ML-% IV SOLN
1000.0000 mg | Freq: Two times a day (BID) | INTRAVENOUS | Status: DC
  Filled 2013-07-11 (×4): qty 200

## 2013-07-11 MED ORDER — SODIUM CHLORIDE 0.9 % IJ SOLN
10.0000 mL | INTRAMUSCULAR | Status: DC | PRN
  Administered 2013-07-11: 10 mL

## 2013-07-11 MED ORDER — PREDNISONE 20 MG PO TABS: 40.0000 mg | ORAL_TABLET | Freq: Every day | ORAL | Status: DC

## 2013-07-11 MED ORDER — OXYCODONE-ACETAMINOPHEN 5-325 MG PO TABS: 1.0000 | ORAL_TABLET | Freq: Four times a day (QID) | ORAL | Status: DC | PRN

## 2013-07-11 MED ORDER — PREDNISONE 20 MG PO TABS
40.0000 mg | ORAL_TABLET | Freq: Every day | ORAL | Status: DC
  Administered 2013-07-11: 40 mg via ORAL
  Filled 2013-07-11: qty 2

## 2013-07-11 MED ORDER — VANCOMYCIN HCL IN DEXTROSE 1-5 GM/200ML-% IV SOLN: 1000.0000 mg | Freq: Two times a day (BID) | INTRAVENOUS | Status: AC

## 2013-07-11 MED ORDER — TRAZODONE 25 MG HALF TABLET: 25.0000 mg | ORAL_TABLET | Freq: Every evening | ORAL | Status: DC | PRN

## 2013-07-11 NOTE — Progress Notes (Signed)
ANTIBIOTIC CONSULT NOTE  Pharmacy Consult for Vancomycin and Zosyn Indication: pneumonia  Allergies  Allergen Reactions  . Morphine     REACTION: sweats   Patient Measurements: Height: 5\' 5"  (165.1 cm) Weight: 136 lb 7.4 oz (61.9 kg) IBW/kg (Calculated) : 61.5  Vital Signs: Temp: 98.3 F (36.8 C) (10/25 0643) BP: 137/65 mmHg (10/25 0643) Pulse Rate: 90 (10/25 0643) Intake/Output from previous day: 10/24 0701 - 10/25 0700 In: 2081.3 [P.O.:240; I.V.:1391.3; IV Piggyback:450] Out: -  Intake/Output from this shift: Total I/O In: 250 [P.O.:240; I.V.:10] Out: -   Labs:  Recent Labs  07/09/13 0630 07/10/13 0606 07/11/13 0522  WBC 18.0* 18.8* 18.2*  HGB 12.1* 11.1* 11.2*  PLT 398 422* 442*  CREATININE 0.73 0.89 0.79   Estimated Creatinine Clearance: 61.9 ml/min (by C-G formula based on Cr of 0.79).  Recent Labs  07/10/13 1851  VANCOTROUGH 18.3    Microbiology: Recent Results (from the past 720 hour(s))  URINE CULTURE     Status: None   Collection Time    06/30/13 12:06 PM      Result Value Range Status   Colony Count NO GROWTH   Final   Organism ID, Bacteria NO GROWTH   Final  INFLUENZA A AND B     Status: None   Collection Time    06/30/13 12:06 PM      Result Value Range Status   Source-INFBD NASAL   Final   Inflenza A Ag NEG  Negative Final   Influenza B Ag NEG  Negative Final  CULTURE, BLOOD (ROUTINE X 2)     Status: None   Collection Time    07/02/13  4:48 PM      Result Value Range Status   Specimen Description BLOOD RIGHT ANTECUBITAL   Final   Special Requests     Final   Value: BOTTLES DRAWN AEROBIC AND ANAEROBIC AEB=11CC ANA=6CC   Culture NO GROWTH 5 DAYS   Final   Report Status 07/07/2013 FINAL   Final  CULTURE, BLOOD (ROUTINE X 2)     Status: None   Collection Time    07/02/13  4:51 PM      Result Value Range Status   Specimen Description BLOOD LEFT ANTECUBITAL   Final   Special Requests     Final   Value: BOTTLES DRAWN AEROBIC AND  ANAEROBIC AEB=12CC ANA=6CC   Culture NO GROWTH 5 DAYS   Final   Report Status 07/07/2013 FINAL   Final  CULTURE, BLOOD (ROUTINE X 2)     Status: None   Collection Time    07/06/13 11:16 AM      Result Value Range Status   Specimen Description BLOOD RIGHT ANTECUBITAL   Final   Special Requests     Final   Value: BOTTLES DRAWN AEROBIC AND ANAEROBIC 10CC  IMMUNE:COMPROMISED   Culture NO GROWTH 4 DAYS   Final   Report Status PENDING   Incomplete  CULTURE, BLOOD (ROUTINE X 2)     Status: None   Collection Time    07/06/13 11:17 AM      Result Value Range Status   Specimen Description BLOOD LEFT ANTECUBITAL   Final   Special Requests     Final   Value: BOTTLES DRAWN AEROBIC AND ANAEROBIC AEB=10CC ANA=8CC IMMUNE:COMPROMISED   Culture NO GROWTH 4 DAYS   Final   Report Status PENDING   Incomplete  URINE CULTURE     Status: None   Collection Time  07/06/13 12:10 PM      Result Value Range Status   Specimen Description URINE, CLEAN CATCH   Final   Special Requests NONE   Final   Culture  Setup Time     Final   Value: 07/07/2013 00:51     Performed at [facility] Count     Final   Value: NO GROWTH     Performed at [facility]   Culture     Final   Value: NO GROWTH     Performed at [facility]   Report Status 07/07/2013 FINAL   Final  MRSA PCR SCREENING     Status: None   Collection Time    07/06/13  1:35 PM      Result Value Range Status   MRSA by PCR NEGATIVE  NEGATIVE Final   Comment:            The GeneXpert MRSA Assay (FDA     approved for NASAL specimens     only), is one component of a     comprehensive MRSA colonization     surveillance program. It is not     intended to diagnose MRSA     infection nor to guide or     monitor treatment for     MRSA infections.  CULTURE, EXPECTORATED SPUTUM-ASSESSMENT     Status: None   Collection Time    07/07/13 11:20 PM      Result Value Range Status   Specimen Description SPUTUM   Final    Special Requests NONE   Final   Sputum evaluation     Final   Value: THIS SPECIMEN IS ACCEPTABLE. RESPIRATORY CULTURE REPORT TO FOLLOW.   Report Status 07/07/2013 FINAL   Final  AFB CULTURE WITH SMEAR     Status: None   Collection Time    07/07/13 11:20 PM      Result Value Range Status   Specimen Description SPUTUM EXPECTORATED   Final   Special Requests NONE   Final   ACID FAST SMEAR     Final   Value: NO ACID FAST BACILLI SEEN     Performed at [facility]   Culture     Final   Value: CULTURE WILL BE EXAMINED FOR 6 WEEKS BEFORE ISSUING A FINAL REPORT     Performed at [facility]   Report Status PENDING   Incomplete  CULTURE, RESPIRATORY (NON-EXPECTORATED)     Status: None   Collection Time    07/07/13 11:20 PM      Result Value Range Status   Specimen Description SPUTUM EXPECTORATED   Final   Special Requests NONE   Final   Gram Stain     Final   Value: ABUNDANT WBC PRESENT,BOTH PMN AND MONONUCLEAR     RARE SQUAMOUS EPITHELIAL CELLS PRESENT     RARE YEAST     Performed at [facility]   Culture     Final   Value: MODERATE CANDIDA ALBICANS     Performed at [facility]   Report Status 07/10/2013 FINAL   Final  AFB CULTURE WITH SMEAR     Status: None   Collection Time    07/09/13  9:30 AM      Result Value Range Status   Specimen Description SPUTUM EXPECTORATED   Final   Special Requests NONE   Final   ACID FAST SMEAR     Final  Value: NO ACID FAST BACILLI SEEN     Performed at [facility]   Culture     Final   Value: CULTURE WILL BE EXAMINED FOR 6 WEEKS BEFORE ISSUING A FINAL REPORT     Performed at [facility]   Report Status PENDING   Incomplete   Medical History: Past Medical History  Diagnosis Date  . Emphysema   . Allergic rhinitis   . Prostate cancer   . Hypothyroid   . Cataract     s/p removal  . PNA (pneumonia)   . Chronic respiratory failure     oxygen 3L at home   Medications:  Scheduled:  .  budesonide-formoterol  2 puff Inhalation BID  . doxazosin  8 mg Oral QHS  . enoxaparin (LOVENOX) injection  40 mg Subcutaneous Q24H  . fluticasone  2 spray Each Nare Daily  . levalbuterol  0.63 mg Nebulization TID  . levothyroxine  75 mcg Oral QAC breakfast  . loratadine  5 mg Oral Daily  . omega-3 acid ethyl esters  2 g Oral Daily  . predniSONE  40 mg Oral QAC breakfast  . sodium chloride  10-40 mL Intracatheter Q12H  . vancomycin  750 mg Intravenous Q8H   Assessment: 79yo male admitted with SOB, fever being treated for HCAP.    Trough at goal.  Plan 2 weeks of IV ABX per MD note.  Goal of Therapy:  Vancomycin trough level 15-20 mcg/ml  Plam: Will change Vancomycin to 1000mg  IV q 12 hrs to help facilitate out patient dosing. Check weekly trough (or as clinically indicated) Monitor labs, renal fxn, and cultures  Mady Gemma 07/11/2013,12:09 PM

## 2013-07-11 NOTE — Discharge Summary (Addendum)
Physician Discharge Summary  Antonius Hartlage ZOX:[id number] DOB: [birth year] DOA: 07/06/2013  PCP: Milinda Antis, MD  Admit date: 07/06/2013 Discharge date: 07/11/2013  Time spent: 65 minutes  Recommendations for Outpatient Follow-up:  1. Follow up with Milinda Antis, MD in 1 week. 2. Followup with Dr. Shelle Iron of [facility] in 2 weeks. Patient's COPD would need to be reassessed at that time as well as his probable necrotizing pneumonia.  Discharge Diagnoses:  Principal Problem:   Acute-on-chronic respiratory failure Active Problems:   HCAP (healthcare-associated pneumonia)/ Probable necrotizing PNA   Necrotizing pneumonia/ Probable   ALLERGIC RHINITIS   COPD (chronic obstructive pulmonary disease) with emphysema   Hypothyroidism   Mild hyperlipidemia   Leukocytosis, unspecified   Hyponatremia   Sinus tachycardia   Anemia   Chest pain   Abnormal CT scan, lung   Discharge Condition: Stable and improved  Diet recommendation: Regular.  Filed Weights   07/06/13 0942 07/06/13 1400 07/07/13 0500  Weight: 60.782 kg (134 lb) 61.236 kg (135 lb) 61.9 kg (136 lb 7.4 oz)    History of present illness:  John Black is a 79 y.o. male past medical history that includes emphysema, chronic respiratory failure, prostate cancer, thyroid disease, chronic bronchitis presents to the emergency department with the chief complaint of worsening shortness of breath and right-sided chest pain. Patient was discharged 2 days ago after a two-day hospitalization for acute on chronic respiratory failure due to pneumonia. He states he was doing "well" for one day and then developed worsening shortness of breath and right sided CP particularly with cough. Associated symptoms include general weakness with mild joint aches as well as chills with fever of 100.6. He denies palpitations, abdominal pain, nausea/vomiting. He denies any difficulty swallowing or chewing. He denies dysuria, hematuria,  frequency or urgency. He reports taking oral antibiotic and steroids as prescribed. He reports wearing oxygen 24/7 at 4L since discharge. Evaluation in ED included chest xray yielding worsening right sided pneumonia. Vital signs significant for HR 116, respiratory rate 32, temp 99.2 and sats 91% on 4L. In ED he received vanc and maxipime and nebs. On exam pt hemodynamically stable. [facility] asked to admit.    Hospital Course:  #1 acute on chronic respiratory failure Patient had presented with worsening shortness of breath and right-sided chest pain after being discharged 2 days prior to admission chest x-ray which was done noted worsening right-sided pneumonia. Patient was admitted. Placed empirically on IV vancomycin and cefepime initially, nebulizer treatments, oxygen, supportive care. Due to patient's rapid deterioration since his prior hospitalization CT of the chest was ordered which showed a diffuse consolidation of the right upper lobe with areas of cavitation superimposed upon chronic obstructive pulmonary changes. Due to the cavitary nature of the chest x-ray patient was subsequently placed on isolation AFB sputum so obtained a sputum culture was also obtained urine Legionella and urine pneumococcus antigens also obtained. Urine Legionella and pneumococcus antigens were negative. AFB sputum preliminary was negative x2 and subsequently isolation was discontinued. Patient improved clinically. IV cefepime was changed to Zosyn. Case was discussed with Dr. Drue Second of infectious disease who also felt patient likely did not have TB. It was felt that patient's pneumonia and a referral CT scan were likely secondary to a necrotizing pneumonia. It was recommended that patient be treated with IV vancomycin for a total of 2 weeks. Patient improved clinically and patient be discharged home on 10 more days of IV vancomycin to complete a two-week course of antibiotic therapy. Patient will  followup with PCP and  pulmonologist as outpatient.  #2 COPD with acute exacerbation On admission patient had presented with worsening shortness of breath and a worsening pneumonia per chest x-ray. CT scan did show cavitary lesions which were initially worrisome for TB which was ruled out and felt these were likely secondary to necrotizing pneumonia. Patient was placed on IV steroids as well as was felt that he had a COPD exacerbation triggered by this pneumonia. Patient was placed on nebulizer treatments as well. Patient improved clinically IV steroids were tapered to oral steroids and patient be discharged home on the oral steroid taper, IV antibiotics for 10 more days, and is to continue on his Combivent. Patient be discharged home on oxygen. Patient will need to followup with PCP and pulmonologist as outpatient.  #3 probable necrotizing pneumonia Patient had presented with worsening shortness of breath after being discharged from the hospital 2 days prior to admission when he was being treated with oral antibiotics for community-acquired pneumonia. Patient was placed on oxygen and admitted to the hospital initially started on vancomycin and cefepime. Chest x-ray which was done on admission showed worsening right-sided pneumonia. Due to patient's rapid decline since prior hospitalization CT scan of the chest was done which showed that his consultation of the right upper lobe with areas of cavitation which may represent diffuse infectious process potentially versus atypical infection such as TB. Patient was low risk for tuberculosis. But due to findings on CT scan patient had to be placed in isolation. AFB was done x3. Case was discussed with infectious disease who had recommended a heavy x3 in airborne isolation. The AFB sputum cultures preliminary reports were negative x2. Was put on that time that patient's cavitary lesions noted on abnormal CT scan were likely secondary to probable necrotizing pneumonia. Patient improved  clinically. IV cefepime had been changed to IV Zosyn. I was also subsequently discontinued patient was maintained on IV vancomycin. Was recommended that I D. to treat patient on IV vancomycin for a total of 2 weeks. Patient was discharged home on IV vancomycin for 10 more days to complete a two-week course of antibiotic therapy. Patient to followup with PCP as outpatient pulmonology as well.  #4 chest pain Patient had some complaints of right-sided chest pain which is likely related to his pneumonia. Initial troponin which was obtained was negative. EKG was unremarkable. It was felt patient's chest was secondary to his pneumonia. Patient's chest pain improved with treatment of his pneumonia had resolved by day of discharge.  The rest of patient's chronic medical issues were stable throughout the hospitalization patient be discharged in stable and improved condition.  Procedures:  Chest x-ray 07/11/2013, 07/06/2013  CT chest 07/06/2013  PICC line placement 07/11/2013  Consultations:  Curb sided ID via phone: Dr Drue Second  Discharge Exam: Filed Vitals:   07/11/13 1420  BP: 119/69  Pulse: 108  Temp: 99.4 F (37.4 C)  Resp: 20    General: NAD Cardiovascular: RRR Respiratory: Min bibasilar coarse BS  Discharge Instructions      Discharge Orders   Future Appointments Provider Department Dept Phone   09/21/2013 10:30 AM Barbaraann Share, MD [facility] [phone]   Future Orders Complete By Expires   Diet general  As directed    Discharge instructions  As directed    Comments:     Follow up with Milinda Antis, MD in 1 week. Follow up with Dr Shelle Iron of [facility] in 2 weeks. Continue to use your oxygen.  Increase activity slowly  As directed        Medication List    STOP taking these medications       azithromycin 500 MG tablet  Commonly known as:  ZITHROMAX     cefUROXime 500 MG tablet  Commonly known as:  CEFTIN     zolpidem 10 MG tablet   Commonly known as:  AMBIEN      TAKE these medications       CITRACAL CALCIUM+D PO  Once a day     CLARITIN 5 MG chewable tablet  Generic drug:  loratadine  Chew 5 mg by mouth daily.     [employer] SPECTRAVITE SENIOR PO  Once a day     doxazosin 8 MG tablet  Commonly known as:  CARDURA  Take 8 mg by mouth at bedtime.     FISH OIL PO  Take 1,000 Units by mouth daily.     Ipratropium-Albuterol 20-100 MCG/ACT Aers respimat  Commonly known as:  COMBIVENT  Inhale 1 puff into the lungs every 6 (six) hours.     levothyroxine 75 MCG tablet  Commonly known as:  SYNTHROID, LEVOTHROID  Take 1 tablet (75 mcg total) by mouth daily.     mometasone 50 MCG/ACT nasal spray  Commonly known as:  NASONEX  Place 2 sprays into the nose daily.     oxyCODONE-acetaminophen 5-325 MG per tablet  Commonly known as:  PERCOCET/ROXICET  Take 1 tablet by mouth every 6 (six) hours as needed.     predniSONE 20 MG tablet  Commonly known as:  DELTASONE  Take 2 tablets (40 mg total) by mouth daily before breakfast. Take 2 tablets (40mg ) daily x 2 days, then 1 tablet (20mg ) daily x 3 days then stop.     SYMBICORT 160-4.5 MCG/ACT inhaler  Generic drug:  budesonide-formoterol  INHALE 2 PUFFS INTO THE LUNGS 2 (TWO) TIMES DAILY.     traZODone 25 mg Tabs tablet  Commonly known as:  DESYREL  Take 0.5 tablets (25 mg total) by mouth at bedtime as needed for sleep.     vancomycin 1 GM/200ML Soln  Commonly known as:  VANCOCIN  Inject 200 mLs (1,000 mg total) into the vein every 12 (twelve) hours. Take for 10 days then stop.       Allergies  Allergen Reactions  . Morphine     REACTION: sweats   Follow-up Information   Follow up with Milinda Antis, MD. Schedule an appointment as soon as possible for a visit in 1 week.   Specialty:  Family Medicine   Contact information:   [address] [place] [address] [phone]       Follow up with Barbaraann Share, MD. Schedule an appointment as soon  as possible for a visit in 2 weeks.   Specialty:  Pulmonary Disease   Contact information:   [address] [place] [address] [phone]        The results of significant diagnostics from this hospitalization (including imaging, microbiology, ancillary and laboratory) are listed below for reference.    Significant Diagnostic Studies: Ct Chest W Contrast  07/06/2013   CLINICAL DATA:  Abnormal chest x-ray. Cough.  EXAM: CT CHEST WITH CONTRAST  TECHNIQUE: Multidetector CT imaging of the chest was performed during intravenous contrast administration.  CONTRAST:  80mL OMNIPAQUE IOHEXOL 300 MG/ML  SOLN  COMPARISON:  07/06/2013 and 10/02/2012 chest x-ray. No comparison chest CT.  FINDINGS: Diffuse consolidation right upper lobe with areas  of cavitation. These findings are superimposed upon chronic obstructive pulmonary changes and may represent diffuse infectious process potentially an atypical infection such as tuberculosis. Recommend followup until clearance to help exclude underlying malignancy.  Pretracheal adenopathy measures 2.9 x 2 cm maximal dimension.  Pulmonary embolus protocol not utilized. No large central pulmonary embolus.  Mild fatty infiltration of the liver without focal hepatic lesion noted.  Coronary artery calcifications. Heart size top-normal.  Atherosclerotic type changes thoracic aorta with ectasia.  Kyphosis without bony destructive lesion.  IMPRESSION: Diffuse consolidation right upper lobe with areas of cavitation may represent diffuse infectious process potentially an atypical infection such as tuberculosis. Recommend followup until clearance.  Please see above.   Electronically Signed   By: Bridgett Larsson M.D.   On: 07/06/2013 14:22   Dg Chest Port 1 View  07/11/2013   CLINICAL DATA:  PICC placement  EXAM: PORTABLE CHEST - 1 VIEW  COMPARISON:  Prior chest x-ray 07/06/2013; prior chest CT also 07/06/2013  FINDINGS: Interval placement of a right upper extremity approach PICC.  The catheter tip is in good position at the superior cavoatrial junction. Stable cardiac and mediastinal contours. There has been some interval decrease in airspace infiltrate and consolidation in the periphery of the right upper lobe compared to the prior radiograph. Background changes of emphysema and COPD are again evident. Atherosclerotic calcifications noted in the transverse aorta. No large pleural effusion. No pneumothorax.  IMPRESSION: Well positioned right upper extremity PICC. Catheter tip projects over the superior cavoatrial junction.  Slightly improved airspace infiltrate in the right upper lobe.   Electronically Signed   By: Malachy Moan M.D.   On: 07/11/2013 10:52   Dg Chest Portable 1 View  07/06/2013   CLINICAL DATA:  Fever and shortness of breath  EXAM: PORTABLE CHEST - 1 VIEW  COMPARISON:  07/02/2013  FINDINGS: Cardiac shadow is stable. The previously seen right-sided infiltrate is again identified and has increased in the interval from the prior exam predominately within the lower lobe. The left lung is clear. No bony abnormality is noted.  IMPRESSION: Increasing right-sided pneumonia.   Electronically Signed   By: Alcide Clever M.D.   On: 07/06/2013 10:17   Dg Chest Portable 1 View  07/02/2013   CLINICAL DATA:  Short of breath with fever. History of prostate cancer.  EXAM: PORTABLE CHEST - 1 VIEW  COMPARISON:  10/02/2012  FINDINGS: Midline trachea. Normal heart size and mediastinal contours. No pleural effusion or pneumothorax. Right upper and infrahilar airspace disease. Clear left lung.  IMPRESSION: Right-sided airspace disease, likely pneumonia. Recommend radiographic follow-up until clearing.   Electronically Signed   By: Jeronimo Greaves M.D.   On: 07/02/2013 12:27    Microbiology: Recent Results (from the past 240 hour(s))  CULTURE, BLOOD (ROUTINE X 2)     Status: None   Collection Time    07/02/13  4:48 PM      Result Value Range Status   Specimen Description BLOOD RIGHT  ANTECUBITAL   Final   Special Requests     Final   Value: BOTTLES DRAWN AEROBIC AND ANAEROBIC AEB=11CC ANA=6CC   Culture NO GROWTH 5 DAYS   Final   Report Status 07/07/2013 FINAL   Final  CULTURE, BLOOD (ROUTINE X 2)     Status: None   Collection Time    07/02/13  4:51 PM      Result Value Range Status   Specimen Description BLOOD LEFT ANTECUBITAL   Final   Special Requests  Final   Value: BOTTLES DRAWN AEROBIC AND ANAEROBIC AEB=12CC ANA=6CC   Culture NO GROWTH 5 DAYS   Final   Report Status 07/07/2013 FINAL   Final  CULTURE, BLOOD (ROUTINE X 2)     Status: None   Collection Time    07/06/13 11:16 AM      Result Value Range Status   Specimen Description BLOOD RIGHT ANTECUBITAL   Final   Special Requests     Final   Value: BOTTLES DRAWN AEROBIC AND ANAEROBIC 10CC  IMMUNE:COMPROMISED   Culture NO GROWTH 4 DAYS   Final   Report Status PENDING   Incomplete  CULTURE, BLOOD (ROUTINE X 2)     Status: None   Collection Time    07/06/13 11:17 AM      Result Value Range Status   Specimen Description BLOOD LEFT ANTECUBITAL   Final   Special Requests     Final   Value: BOTTLES DRAWN AEROBIC AND ANAEROBIC AEB=10CC ANA=8CC IMMUNE:COMPROMISED   Culture NO GROWTH 4 DAYS   Final   Report Status PENDING   Incomplete  URINE CULTURE     Status: None   Collection Time    07/06/13 12:10 PM      Result Value Range Status   Specimen Description URINE, CLEAN CATCH   Final   Special Requests NONE   Final   Culture  Setup Time     Final   Value: 07/07/2013 00:51     Performed at [facility] Count     Final   Value: NO GROWTH     Performed at [facility]   Culture     Final   Value: NO GROWTH     Performed at [facility]   Report Status 07/07/2013 FINAL   Final  MRSA PCR SCREENING     Status: None   Collection Time    07/06/13  1:35 PM      Result Value Range Status   MRSA by PCR NEGATIVE  NEGATIVE Final   Comment:            The GeneXpert MRSA Assay  (FDA     approved for NASAL specimens     only), is one component of a     comprehensive MRSA colonization     surveillance program. It is not     intended to diagnose MRSA     infection nor to guide or     monitor treatment for     MRSA infections.  CULTURE, EXPECTORATED SPUTUM-ASSESSMENT     Status: None   Collection Time    07/07/13 11:20 PM      Result Value Range Status   Specimen Description SPUTUM   Final   Special Requests NONE   Final   Sputum evaluation     Final   Value: THIS SPECIMEN IS ACCEPTABLE. RESPIRATORY CULTURE REPORT TO FOLLOW.   Report Status 07/07/2013 FINAL   Final  AFB CULTURE WITH SMEAR     Status: None   Collection Time    07/07/13 11:20 PM      Result Value Range Status   Specimen Description SPUTUM EXPECTORATED   Final   Special Requests NONE   Final   ACID FAST SMEAR     Final   Value: NO ACID FAST BACILLI SEEN     Performed at [facility]   Culture     Final   Value: CULTURE WILL BE EXAMINED  FOR 6 WEEKS BEFORE ISSUING A FINAL REPORT     Performed at [facility]   Report Status PENDING   Incomplete  CULTURE, RESPIRATORY (NON-EXPECTORATED)     Status: None   Collection Time    07/07/13 11:20 PM      Result Value Range Status   Specimen Description SPUTUM EXPECTORATED   Final   Special Requests NONE   Final   Gram Stain     Final   Value: ABUNDANT WBC PRESENT,BOTH PMN AND MONONUCLEAR     RARE SQUAMOUS EPITHELIAL CELLS PRESENT     RARE YEAST     Performed at [facility]   Culture     Final   Value: MODERATE CANDIDA ALBICANS     Performed at [facility]   Report Status 07/10/2013 FINAL   Final  AFB CULTURE WITH SMEAR     Status: None   Collection Time    07/09/13  9:30 AM      Result Value Range Status   Specimen Description SPUTUM EXPECTORATED   Final   Special Requests NONE   Final   ACID FAST SMEAR     Final   Value: NO ACID FAST BACILLI SEEN     Performed at [facility]   Culture     Final    Value: CULTURE WILL BE EXAMINED FOR 6 WEEKS BEFORE ISSUING A FINAL REPORT     Performed at [facility]   Report Status PENDING   Incomplete     Labs: Basic Metabolic Panel:  Recent Labs Lab 07/06/13 1011 07/06/13 1333 07/07/13 0507 07/08/13 0530 07/09/13 0630 07/10/13 0606 07/11/13 0522  NA 133*  --  130* 133* 134* 137 135  K 3.7  --  3.9 4.0 4.3 4.2 4.1  CL 96  --  93* 96 95* 95* 96  CO2 28  --  29 32 32 35* 34*  GLUCOSE 121*  --  117* 96 166* 137* 131*  BUN 18  --  14 11 15  25* 24*  CREATININE 0.76  --  0.74 0.75 0.73 0.89 0.79  CALCIUM 9.2  --  8.6 8.7 9.2 8.8 8.8  MG  --  1.9  --   --   --   --   --    Liver Function Tests: No results found for this basename: AST, ALT, ALKPHOS, BILITOT, PROT, ALBUMIN,  in the last 168 hours No results found for this basename: LIPASE, AMYLASE,  in the last 168 hours No results found for this basename: AMMONIA,  in the last 168 hours CBC:  Recent Labs Lab 07/06/13 1011 07/07/13 0507 07/09/13 0630 07/10/13 0606 07/11/13 0522  WBC 16.4* 16.7* 18.0* 18.8* 18.2*  NEUTROABS 13.8* 14.4*  --   --   --   HGB 13.6 12.9* 12.1* 11.1* 11.2*  HCT 39.6 38.7* 35.9* 33.1* 33.3*  MCV 85.2 85.6 86.5 86.6 86.5  PLT 326 337 398 422* 442*   Cardiac Enzymes:  Recent Labs Lab 07/06/13 1011  TROPONINI <0.30   BNP: BNP (last 3 results) No results found for this basename: PROBNP,  in the last 8760 hours CBG: No results found for this basename: GLUCAP,  in the last 168 hours     Signed:  [facility]  [facility] 07/11/2013, 4:29 PM

## 2013-07-11 NOTE — Progress Notes (Signed)
Pt discharged with instructions, prescriptions, and care notes.  Pt and family verbalized understanding.  [facility] was notified prior to discharge and Sue Lush was notified of the patients discharge.  I faxed over the vancomycin order and wrote on the faxed that the last time he received the vancomycin which was 1400.  I also notified Dr. Janee Morn for the patients request for Percocet and Trazodone order.  New scripts was written.  Pt left the floor via w/c with staff and family in stable condition.

## 2013-07-12 DIAGNOSIS — J189 Pneumonia, unspecified organism: Secondary | ICD-10-CM

## 2013-07-12 DIAGNOSIS — J441 Chronic obstructive pulmonary disease with (acute) exacerbation: Secondary | ICD-10-CM

## 2013-07-12 DIAGNOSIS — E039 Hypothyroidism, unspecified: Secondary | ICD-10-CM

## 2013-07-12 DIAGNOSIS — J438 Other emphysema: Secondary | ICD-10-CM

## 2013-07-12 LAB — CULTURE, BLOOD (ROUTINE X 2)
Culture: NO GROWTH
Culture: NO GROWTH

## 2013-07-13 ENCOUNTER — Telehealth (HOSPITAL_COMMUNITY): Payer: Self-pay | Admitting: *Deleted

## 2013-07-13 ENCOUNTER — Telehealth: Payer: Self-pay | Admitting: Pulmonary Disease

## 2013-07-13 NOTE — Telephone Encounter (Signed)
I spoke with John Black. She reports they have an order for pulm rehab dx copd with emphysema. It only can be one DX. If [facility] wants to use COPD then he must have have pre and post spiro/PFT within a year (his last one 2012). If we use Emphysema then pt does not require PFT's. Please advise [facility] thanks

## 2013-07-13 NOTE — Telephone Encounter (Signed)
[facility] doesn't have any openings until 08/19/13. Ashtyn - is there any where we can work this pt in?

## 2013-07-14 NOTE — Telephone Encounter (Signed)
Pt D/C from hospital 07/11/13 with instructions to f/u in 2 weeks to re-eval COPD and probable necrotizing PNA [facility] has no appts available within the next 2-3 weeks.  Shanda Bumps please advise if/where patient can be placed on TP schedule for hospital f/u around 07/25/13 then referred back to [facility]. Thanks.

## 2013-07-14 NOTE — Telephone Encounter (Signed)
I spoke with pt and scheduled appt for 07/24/13 at 3:45 ith TP. Nothing further needed

## 2013-07-14 NOTE — Telephone Encounter (Signed)
Pt calling again in ref to previous msg can be reached at [phone].John Black

## 2013-07-16 ENCOUNTER — Telehealth: Payer: Self-pay | Admitting: Family Medicine

## 2013-07-16 NOTE — Telephone Encounter (Signed)
I spoke with with home health nurse pt has appt tomorrow, told her to jsut force fluids and tylenol every 6hrs as needed, told her is fever got higher than 102 then he needed to go to ED.

## 2013-07-16 NOTE — Telephone Encounter (Signed)
Patient is now stating to run fevers during the day. Daughter is wanting to know if we need to see him . There are no appointment available today.

## 2013-07-17 ENCOUNTER — Encounter: Payer: Self-pay | Admitting: Family Medicine

## 2013-07-17 ENCOUNTER — Ambulatory Visit (INDEPENDENT_AMBULATORY_CARE_PROVIDER_SITE_OTHER): Payer: Medicare Other | Admitting: Family Medicine

## 2013-07-17 VITALS — BP 118/60 | HR 100 | Temp 98.1°F | Resp 20 | Ht 64.0 in | Wt 128.0 lb

## 2013-07-17 DIAGNOSIS — R634 Abnormal weight loss: Secondary | ICD-10-CM

## 2013-07-17 DIAGNOSIS — J189 Pneumonia, unspecified organism: Secondary | ICD-10-CM

## 2013-07-17 DIAGNOSIS — E871 Hypo-osmolality and hyponatremia: Secondary | ICD-10-CM

## 2013-07-17 DIAGNOSIS — G4700 Insomnia, unspecified: Secondary | ICD-10-CM

## 2013-07-17 DIAGNOSIS — J850 Gangrene and necrosis of lung: Secondary | ICD-10-CM

## 2013-07-17 DIAGNOSIS — R600 Localized edema: Secondary | ICD-10-CM

## 2013-07-17 DIAGNOSIS — D72829 Elevated white blood cell count, unspecified: Secondary | ICD-10-CM

## 2013-07-17 DIAGNOSIS — R609 Edema, unspecified: Secondary | ICD-10-CM

## 2013-07-17 DIAGNOSIS — R509 Fever, unspecified: Secondary | ICD-10-CM

## 2013-07-17 DIAGNOSIS — J9620 Acute and chronic respiratory failure, unspecified whether with hypoxia or hypercapnia: Secondary | ICD-10-CM

## 2013-07-17 MED ORDER — OXYCODONE-ACETAMINOPHEN 5-325 MG PO TABS: 1.0000 | ORAL_TABLET | Freq: Four times a day (QID) | ORAL | Status: DC | PRN

## 2013-07-17 MED ORDER — TRAZODONE HCL 50 MG PO TABS: 25.0000 mg | ORAL_TABLET | Freq: Every day | ORAL | Status: DC

## 2013-07-17 NOTE — Patient Instructions (Signed)
Continue oxygen therapy Continue current meds I will call with lab results F/U 6 weeks

## 2013-07-18 DIAGNOSIS — R634 Abnormal weight loss: Secondary | ICD-10-CM | POA: Insufficient documentation

## 2013-07-18 DIAGNOSIS — R600 Localized edema: Secondary | ICD-10-CM | POA: Insufficient documentation

## 2013-07-18 LAB — BASIC METABOLIC PANEL
BUN: 17 mg/dL (ref 6–23)
CO2: 29 mEq/L (ref 19–32)
Calcium: 8.8 mg/dL (ref 8.4–10.5)
Chloride: 99 mEq/L (ref 96–112)
Creat: 0.76 mg/dL (ref 0.50–1.35)
Glucose, Bld: 124 mg/dL — ABNORMAL HIGH (ref 70–99)
Potassium: 4.8 mEq/L (ref 3.5–5.3)
Sodium: 136 mEq/L (ref 135–145)

## 2013-07-18 LAB — CBC WITH DIFFERENTIAL/PLATELET
Basophils Absolute: 0 10*3/uL (ref 0.0–0.1)
Basophils Relative: 0 % (ref 0–1)
Eosinophils Absolute: 0 10*3/uL (ref 0.0–0.7)
Eosinophils Relative: 0 % (ref 0–5)
HCT: 35.9 % — ABNORMAL LOW (ref 39.0–52.0)
Hemoglobin: 12.2 g/dL — ABNORMAL LOW (ref 13.0–17.0)
Lymphocytes Relative: 4 % — ABNORMAL LOW (ref 12–46)
Lymphs Abs: 0.7 10*3/uL (ref 0.7–4.0)
MCH: 29 pg (ref 26.0–34.0)
MCHC: 34 g/dL (ref 30.0–36.0)
MCV: 85.5 fL (ref 78.0–100.0)
Monocytes Absolute: 1 10*3/uL (ref 0.1–1.0)
Monocytes Relative: 6 % (ref 3–12)
Neutro Abs: 15.2 10*3/uL — ABNORMAL HIGH (ref 1.7–7.7)
Neutrophils Relative %: 90 % — ABNORMAL HIGH (ref 43–77)
Platelets: 437 10*3/uL — ABNORMAL HIGH (ref 150–400)
RBC: 4.2 MIL/uL — ABNORMAL LOW (ref 4.22–5.81)
RDW: 14.5 % (ref 11.5–15.5)
WBC: 16.9 10*3/uL — ABNORMAL HIGH (ref 4.0–10.5)

## 2013-07-18 NOTE — Assessment & Plan Note (Signed)
Continue trazodone 

## 2013-07-18 NOTE — Assessment & Plan Note (Signed)
Continue Vanc, trough again on Monday  Will obtain Blood culture as he did have a low grade temp the other day He looks fairly well today Obtain CBC w diff today

## 2013-07-18 NOTE — Assessment & Plan Note (Signed)
Increase ensure, due to recent pneumonia

## 2013-07-18 NOTE — Assessment & Plan Note (Signed)
Dependent edema from fluids and decreased activity, already resolving Elevate feet

## 2013-07-18 NOTE — Assessment & Plan Note (Signed)
Recheck metabolic panel.   

## 2013-07-18 NOTE — Assessment & Plan Note (Signed)
Continues to require 4L of oxygen to maintain stats of 90% But symptoms are improving

## 2013-07-18 NOTE — Progress Notes (Signed)
  Subjective:    Patient ID: John Black, male    DOB: 1928/12/26, 79 y.o.   [name]: [id number]  HPI   Pt here for hospital follow-up was admitted for Pneumonia and acute on chronic respiratory failure, was treated with antibiotics an oral steroids and discharged home. He returned to ED 48 hours after discharge due to worsening SOB and fever, found to have necrotizing PNA/HCAP and was readmitted. Discharged home on prednisone taper and IV Vancomycin for 10 days via PICC line.  He is still on 4L of oxygen and sats at home have been 90-92%. When he uses his portable oxygen his sats are lower.  States he feels good, continues to have dry hacking cough with very little production. Two nights ago had a very low grade temperature,none in past 48hours. Wife and daughter are giving his antibiotics via PICC line. His appetite is fair and he is still fatigued. Has been drinking ensure once a day  Hospital discharge reviewed  Review of Systems  GEN- + fatigue, +fever, +weight loss,+weakness, recent illness HEENT- denies eye drainage, change in vision, nasal discharge, CVS- denies chest pain, palpitations RESP- + SOB, +cough, wheeze ABD- denies N/V, change in stools, abd pain GU- denies dysuria, hematuria, dribbling, incontinence MSK- denies joint pain, muscle aches, injury Neuro- denies headache, dizziness, syncope, seizure activity      Objective:   Physical Exam GEN- NAD, alert and oriented x3 HEENT- PERRL, EOMI, non injected sclera, pink conjunctiva, MMM, oropharynx clear Neck- Supple, no LAD, no JVD CVS- resting tachycardia, no murmur RESP- bilateral rhonchi, air movement to bases, with portable Oxygen increased WOB- stat 86%, switched to continuous flow more calm, sat 90%, few wheeze bilaterally EXT- pedal R>l  edema Pulses- Radial, DP- 2+        Assessment & Plan:

## 2013-07-18 NOTE — Assessment & Plan Note (Signed)
WBC elevated to 25,000 on 10/27, however on high dose prednisone and with current infection Recheck CBC today

## 2013-07-20 ENCOUNTER — Telehealth: Payer: Self-pay | Admitting: Family Medicine

## 2013-07-20 NOTE — Telephone Encounter (Signed)
Please call his nurse back  1. He should have the oxygen at home which is on 4L that is continous, states he has large concentrator, his portable oxygen he has to take a breath before it will come out  2. I saw pt Friday, aware of the low grade fever, I spoke with pt Yesterday, his CBC is better, and Blood cultures are negative to date, continue Vanc  3. For sleep he can either try 1 whole table 50mg  of trazodone or try [place] again

## 2013-07-20 NOTE — Telephone Encounter (Signed)
[facility] stating that you were going to fax over an order for continous forced O2 tank they have not received the order yet, family is also concerned about running fevers on/off daily been running 100.1 this weekend, also states that trazodone is not working went back to [place] and it works just wakes up at [employer]. Wants to know what your suggestions are?

## 2013-07-20 NOTE — Telephone Encounter (Signed)
Left message with [facility] to return my call.

## 2013-07-20 NOTE — Telephone Encounter (Signed)
The new ICD 10 system will not allow [place] to use just copd anymore.  There are a billion causes of copd, one of which is emphysema, and we usually have to code just like I did.  If they want to take just emphysema from that, it is ok with me.

## 2013-07-21 ENCOUNTER — Telehealth (HOSPITAL_COMMUNITY): Payer: Self-pay

## 2013-07-21 ENCOUNTER — Telehealth: Payer: Self-pay | Admitting: *Deleted

## 2013-07-21 NOTE — Telephone Encounter (Signed)
I contacted the patient to inquire about Pulmonary Rehab interest.  Patient states that he does not feel that he is ready yet to exercise. Patient is interested in attending the [facility] program since it is very close to their home.  I will contact Annie Pen and follow up with patient.

## 2013-07-21 NOTE — Telephone Encounter (Signed)
Problem with [facility] and order for continuous air flow.  [facility] nurse said need continuous regulator.  Needs order to [facility] to change. On 4l/min.

## 2013-07-21 NOTE — Telephone Encounter (Signed)
Form received and given to Ashtyn. Please have KC sign, date, and time this and fax to Pulmonary Rehab.  Thanks!

## 2013-07-21 NOTE — Telephone Encounter (Signed)
Called, spoke with Aurea Graff with Pulmonary Rehab.  Informed her of below.  She verbalized understanding of this.  But, what she is needing, is the Pulmonary Rehab Form completed again with ONLY 1 dx (emphysema will be ok per below msg from [facility]).  There is 2 dx on the form she currently has - copd with emphysema.   With the COPD dx, pt will need to have a full PFT with pre and post.  Aurea Graff will refax this form to triage.  It will need KC's signature, date, and time on it.  Will await fax.

## 2013-07-21 NOTE — Telephone Encounter (Signed)
Form is in [facility] folder for [facility] review and sig.

## 2013-07-22 NOTE — Telephone Encounter (Signed)
Faxed prescription to [facility]

## 2013-07-24 ENCOUNTER — Other Ambulatory Visit: Payer: Self-pay

## 2013-07-24 ENCOUNTER — Ambulatory Visit (INDEPENDENT_AMBULATORY_CARE_PROVIDER_SITE_OTHER)
Admission: RE | Admit: 2013-07-24 | Discharge: 2013-07-24 | Disposition: A | Discharge status: Home / Self Care | Payer: Medicare Other | Source: Ambulatory Visit | Attending: Adult Health | Admitting: Adult Health

## 2013-07-24 ENCOUNTER — Inpatient Hospital Stay (HOSPITAL_COMMUNITY)
Admission: AD | Admit: 2013-07-24 | Discharge: 2013-07-27 | DRG: 193 | Disposition: A | Discharge status: Home Health | Payer: Medicare Other | Source: Ambulatory Visit | Attending: Pulmonary Disease | Admitting: Pulmonary Disease

## 2013-07-24 ENCOUNTER — Encounter: Payer: Self-pay | Admitting: Adult Health

## 2013-07-24 ENCOUNTER — Ambulatory Visit (INDEPENDENT_AMBULATORY_CARE_PROVIDER_SITE_OTHER): Payer: Medicare Other | Admitting: Adult Health

## 2013-07-24 VITALS — BP 114/64 | HR 115 | Temp 100.1°F | Ht 66.0 in | Wt 131.0 lb

## 2013-07-24 DIAGNOSIS — Z87891 Personal history of nicotine dependence: Secondary | ICD-10-CM

## 2013-07-24 DIAGNOSIS — J189 Pneumonia, unspecified organism: Principal | ICD-10-CM | POA: Diagnosis present

## 2013-07-24 DIAGNOSIS — J439 Emphysema, unspecified: Secondary | ICD-10-CM

## 2013-07-24 DIAGNOSIS — J441 Chronic obstructive pulmonary disease with (acute) exacerbation: Secondary | ICD-10-CM | POA: Diagnosis present

## 2013-07-24 DIAGNOSIS — J449 Chronic obstructive pulmonary disease, unspecified: Secondary | ICD-10-CM | POA: Diagnosis present

## 2013-07-24 DIAGNOSIS — J852 Abscess of lung without pneumonia: Secondary | ICD-10-CM | POA: Diagnosis present

## 2013-07-24 DIAGNOSIS — E039 Hypothyroidism, unspecified: Secondary | ICD-10-CM | POA: Diagnosis present

## 2013-07-24 DIAGNOSIS — J438 Other emphysema: Secondary | ICD-10-CM

## 2013-07-24 DIAGNOSIS — J9620 Acute and chronic respiratory failure, unspecified whether with hypoxia or hypercapnia: Secondary | ICD-10-CM

## 2013-07-24 DIAGNOSIS — C61 Malignant neoplasm of prostate: Secondary | ICD-10-CM | POA: Diagnosis present

## 2013-07-24 DIAGNOSIS — Z79899 Other long term (current) drug therapy: Secondary | ICD-10-CM

## 2013-07-24 DIAGNOSIS — J9610 Chronic respiratory failure, unspecified whether with hypoxia or hypercapnia: Secondary | ICD-10-CM | POA: Diagnosis present

## 2013-07-24 DIAGNOSIS — Z9981 Dependence on supplemental oxygen: Secondary | ICD-10-CM

## 2013-07-24 LAB — COMPREHENSIVE METABOLIC PANEL
ALT: 43 U/L (ref 0–53)
AST: 28 U/L (ref 0–37)
Albumin: 2.5 g/dL — ABNORMAL LOW (ref 3.5–5.2)
Alkaline Phosphatase: 84 U/L (ref 39–117)
BUN: 17 mg/dL (ref 6–23)
CO2: 27 mEq/L (ref 19–32)
Calcium: 9.2 mg/dL (ref 8.4–10.5)
Chloride: 94 mEq/L — ABNORMAL LOW (ref 96–112)
Creatinine, Ser: 0.73 mg/dL (ref 0.50–1.35)
GFR calc Af Amer: >90 mL/min (ref >90)
GFR calc non Af Amer: 84 mL/min — ABNORMAL LOW (ref >90)
Glucose, Bld: 175 mg/dL — ABNORMAL HIGH (ref 70–99)
Potassium: 4.3 mEq/L (ref 3.5–5.1)
Sodium: 132 mEq/L — ABNORMAL LOW (ref 135–145)
Total Bilirubin: 0.2 mg/dL — ABNORMAL LOW (ref 0.3–1.2)
Total Protein: 6.6 g/dL (ref 6.0–8.3)

## 2013-07-24 LAB — CBC WITH DIFFERENTIAL/PLATELET
Basophils Absolute: 0 10*3/uL (ref 0.0–0.1)
Basophils Relative: 0 % (ref 0–1)
Eosinophils Absolute: 0.1 10*3/uL (ref 0.0–0.7)
Eosinophils Relative: 1 % (ref 0–5)
HCT: 34.6 % — ABNORMAL LOW (ref 39.0–52.0)
Hemoglobin: 11.5 g/dL — ABNORMAL LOW (ref 13.0–17.0)
Lymphocytes Relative: 13 % (ref 12–46)
Lymphs Abs: 1.4 10*3/uL (ref 0.7–4.0)
MCH: 28.3 pg (ref 26.0–34.0)
MCHC: 33.2 g/dL (ref 30.0–36.0)
MCV: 85.2 fL (ref 78.0–100.0)
Monocytes Absolute: 0.8 10*3/uL (ref 0.1–1.0)
Monocytes Relative: 8 % (ref 3–12)
Neutro Abs: 7.8 10*3/uL — ABNORMAL HIGH (ref 1.7–7.7)
Neutrophils Relative %: 77 % (ref 43–77)
Platelets: 371 10*3/uL (ref 150–400)
RBC: 4.06 MIL/uL — ABNORMAL LOW (ref 4.22–5.81)
RDW: 14.4 % (ref 11.5–15.5)
WBC: 10.1 10*3/uL (ref 4.0–10.5)

## 2013-07-24 LAB — CULTURE, BLOOD (SINGLE): Organism ID, Bacteria: NO GROWTH

## 2013-07-24 LAB — PRO B NATRIURETIC PEPTIDE: Pro B Natriuretic peptide (BNP): 335.2 pg/mL (ref 0–450)

## 2013-07-24 IMAGING — CR DG CHEST 2V
2 series · 2 of 2 positions shown · non-contrast
Comparison: [DATE]. [DATE].  [DATE].

CLINICAL DATA: Followup pneumonia. Short of breath.

EXAM:
CHEST  2 VIEW

[view not recorded (1 of 2)]
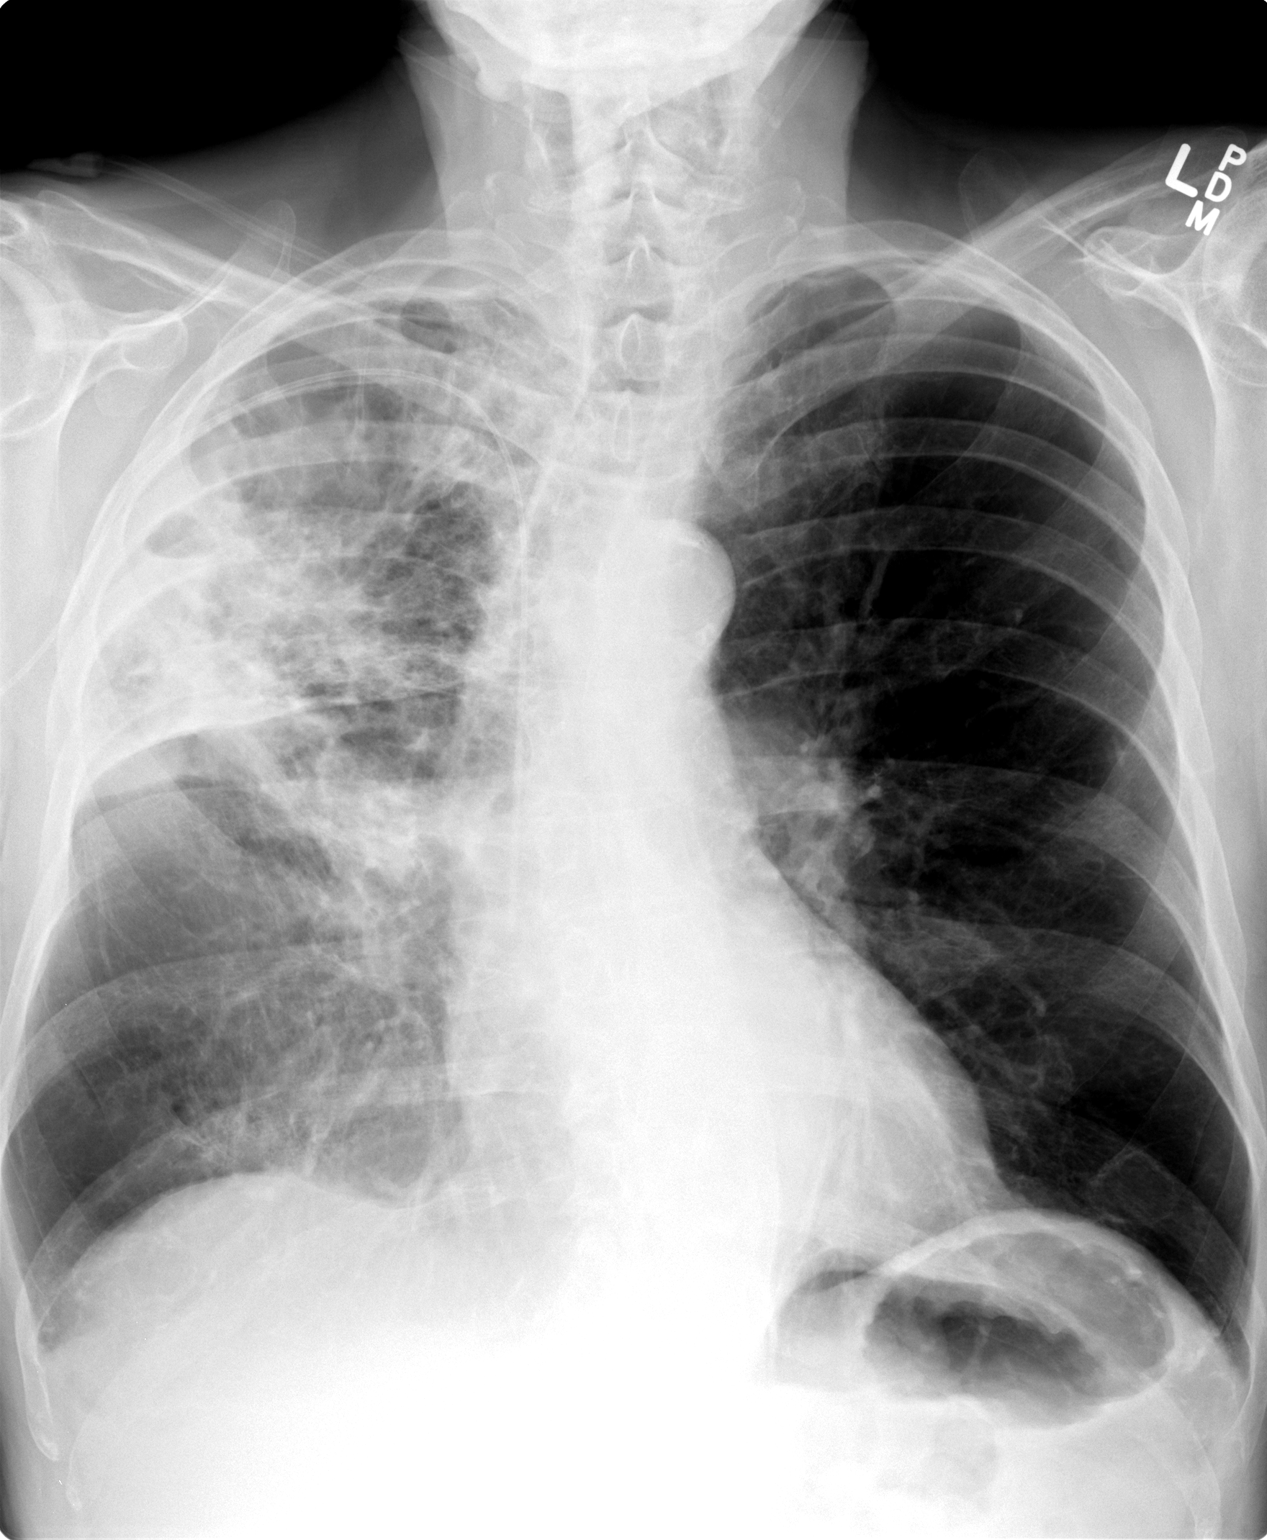

[view not recorded (2 of 2)]
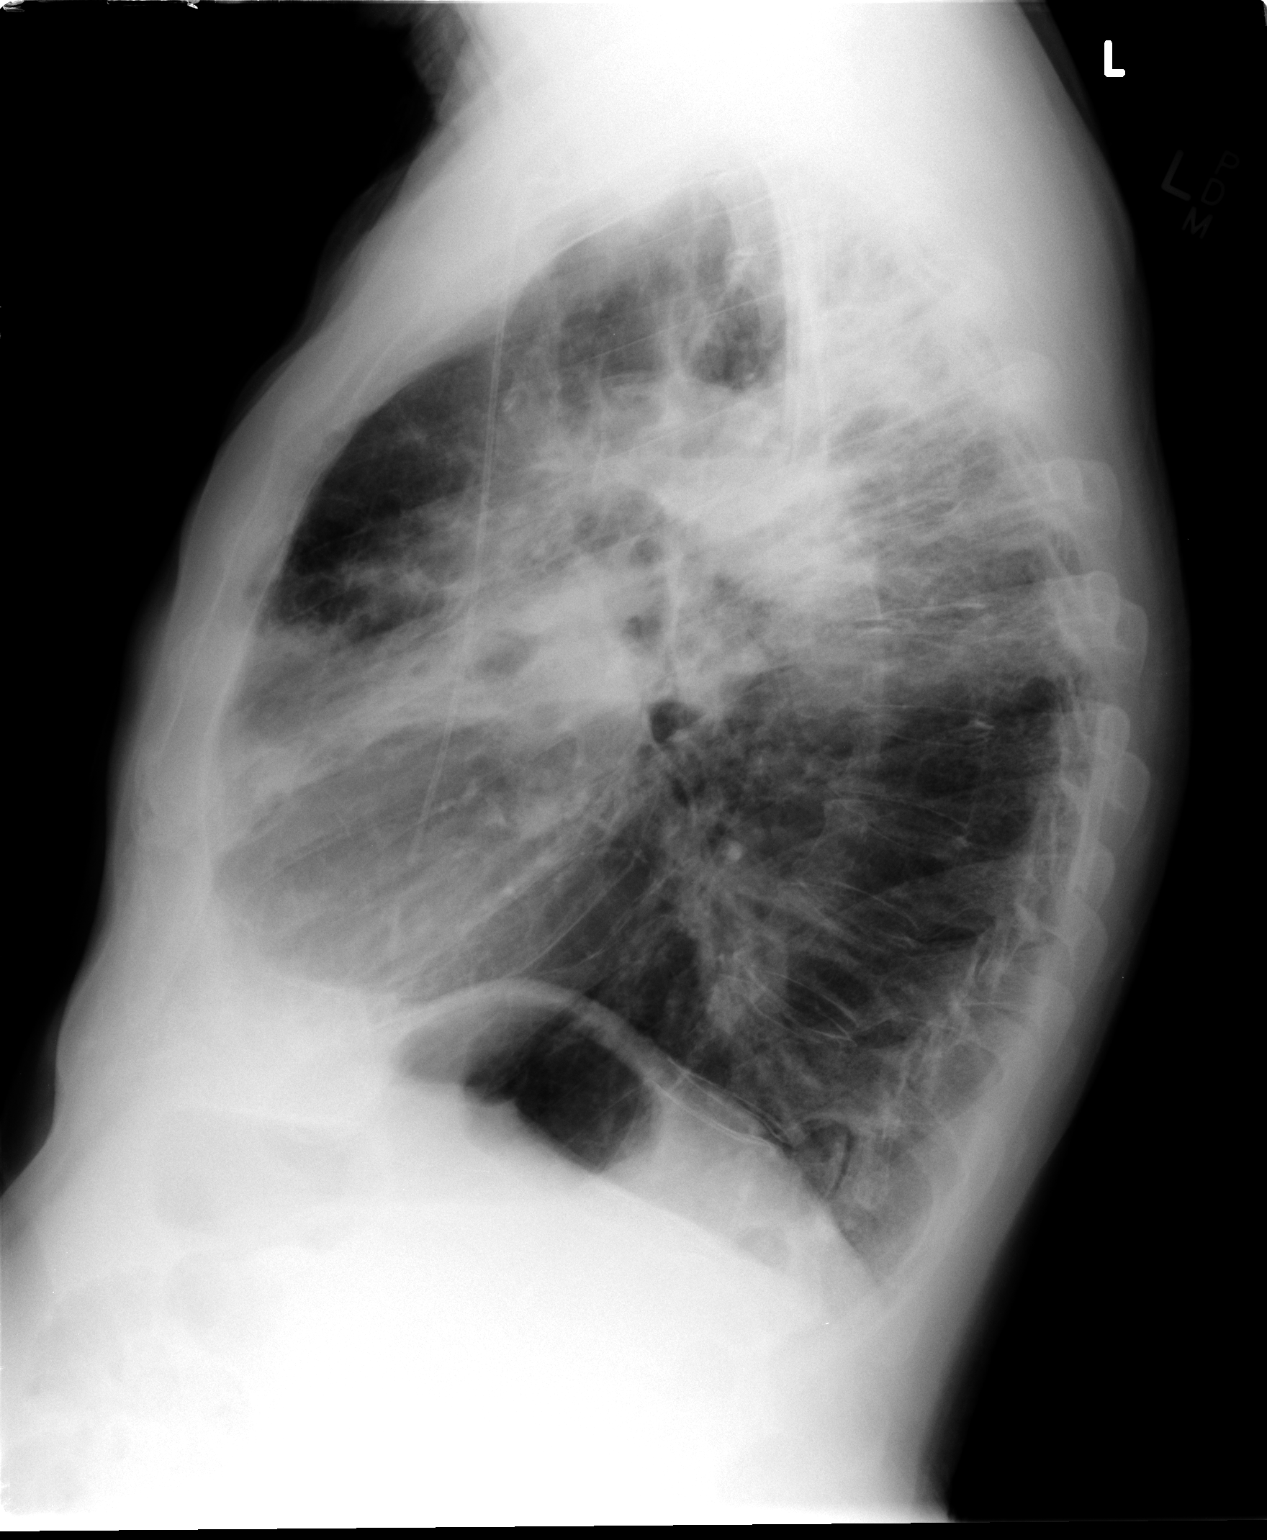

[2 of 2 positions shown; findings below may reference images not displayed]

FINDINGS: Heart size remains normal. Right arm PICC has its tip in the SVC
just above the right atrium. Emphysematous change again noted
affecting the left lung without focal infiltrate. On the right,
there is worsening of consolidation in the upper lobe with some
volume loss. Mild patchy density persists in the lower lobe. No
pleural fluid accumulation. Some air-fluid levels in the upper lobe
probably relate to inflammation within bullous disease.
IMPRESSION: Worsening of pneumonia in the emphysematous right upper lobe.

## 2013-07-24 MED ORDER — ZOLPIDEM TARTRATE 5 MG PO TABS: 5.0000 mg | ORAL_TABLET | Freq: Every evening | ORAL | Status: DC | PRN

## 2013-07-24 MED ORDER — ACETAMINOPHEN 325 MG PO TABS: 650.0000 mg | ORAL_TABLET | Freq: Four times a day (QID) | ORAL | Status: DC | PRN

## 2013-07-24 MED ORDER — SENNOSIDES-DOCUSATE SODIUM 8.6-50 MG PO TABS: 1.0000 | ORAL_TABLET | Freq: Every evening | ORAL | Status: DC | PRN

## 2013-07-24 MED ORDER — SODIUM CHLORIDE 0.9 % IJ SOLN
3.0000 mL | Freq: Two times a day (BID) | INTRAMUSCULAR | Status: DC
  Administered 2013-07-26: 3 mL via INTRAVENOUS

## 2013-07-24 MED ORDER — ALBUTEROL SULFATE (5 MG/ML) 0.5% IN NEBU: 2.5000 mg | INHALATION_SOLUTION | RESPIRATORY_TRACT | Status: DC | PRN

## 2013-07-24 MED ORDER — PIPERACILLIN-TAZOBACTAM 3.375 G IVPB
3.3750 g | Freq: Three times a day (TID) | INTRAVENOUS | Status: DC
  Administered 2013-07-24 – 2013-07-27 (×9): 3.375 g via INTRAVENOUS
  Filled 2013-07-24 (×10): qty 50

## 2013-07-24 MED ORDER — SODIUM CHLORIDE 0.9 % IJ SOLN: 10.0000 mL | Freq: Two times a day (BID) | INTRAMUSCULAR | Status: DC

## 2013-07-24 MED ORDER — ALBUTEROL SULFATE (5 MG/ML) 0.5% IN NEBU
2.5000 mg | INHALATION_SOLUTION | Freq: Four times a day (QID) | RESPIRATORY_TRACT | Status: DC
  Administered 2013-07-24 – 2013-07-27 (×8): 2.5 mg via RESPIRATORY_TRACT
  Filled 2013-07-24 (×8): qty 0.5

## 2013-07-24 MED ORDER — IPRATROPIUM BROMIDE 0.02 % IN SOLN
0.5000 mg | Freq: Four times a day (QID) | RESPIRATORY_TRACT | Status: DC
  Administered 2013-07-24 – 2013-07-27 (×8): 0.5 mg via RESPIRATORY_TRACT
  Filled 2013-07-24 (×8): qty 2.5

## 2013-07-24 MED ORDER — ZOLPIDEM TARTRATE 5 MG PO TABS
5.0000 mg | ORAL_TABLET | Freq: Every evening | ORAL | Status: DC | PRN
  Administered 2013-07-24 – 2013-07-26 (×3): 5 mg via ORAL
  Filled 2013-07-24 (×3): qty 1

## 2013-07-24 MED ORDER — ONDANSETRON HCL 4 MG/2ML IJ SOLN: 4.0000 mg | Freq: Four times a day (QID) | INTRAMUSCULAR | Status: DC | PRN

## 2013-07-24 MED ORDER — LEVOTHYROXINE SODIUM 75 MCG PO TABS
75.0000 ug | ORAL_TABLET | Freq: Every day | ORAL | Status: DC
  Administered 2013-07-25 – 2013-07-27 (×3): 75 ug via ORAL
  Filled 2013-07-24 (×4): qty 1

## 2013-07-24 MED ORDER — ONDANSETRON HCL 4 MG PO TABS: 4.0000 mg | ORAL_TABLET | Freq: Four times a day (QID) | ORAL | Status: DC | PRN

## 2013-07-24 MED ORDER — HEPARIN SODIUM (PORCINE) 5000 UNIT/ML IJ SOLN
5000.0000 [IU] | Freq: Three times a day (TID) | INTRAMUSCULAR | Status: DC
  Administered 2013-07-24 – 2013-07-27 (×7): 5000 [IU] via SUBCUTANEOUS
  Filled 2013-07-24 (×11): qty 1

## 2013-07-24 MED ORDER — OXYCODONE-ACETAMINOPHEN 5-325 MG PO TABS: 1.0000 | ORAL_TABLET | Freq: Four times a day (QID) | ORAL | Status: DC | PRN

## 2013-07-24 MED ORDER — DOXAZOSIN MESYLATE 8 MG PO TABS
8.0000 mg | ORAL_TABLET | Freq: Every day | ORAL | Status: DC
  Administered 2013-07-24 – 2013-07-26 (×3): 8 mg via ORAL
  Filled 2013-07-24 (×4): qty 1

## 2013-07-24 MED ORDER — SODIUM CHLORIDE 0.9 % IJ SOLN
10.0000 mL | INTRAMUSCULAR | Status: DC | PRN
  Administered 2013-07-27: 10 mL

## 2013-07-24 MED ORDER — ACETAMINOPHEN 650 MG RE SUPP: 650.0000 mg | Freq: Four times a day (QID) | RECTAL | Status: DC | PRN

## 2013-07-24 NOTE — H&P (Signed)
PULMONARY  / CRITICAL CARE MEDICINE  Name: John Black [name]: [id number] DOB: 1929/04/28    ADMISSION DATE:  (Not on file)  PRIMARY SERVICE:  Pulmonary   CHIEF COMPLAINT:  Follow up PNA -"Not getting any better "   BRIEF PATIENT DESCRIPTION:  79 yo former smoker with known hx of COPD with chronic respiratory failure on home O2 recent admission 07/06/13 for HCAP for probable necrotizing PNA , discharged on Vancomycin x 10d via PICC.  Readmitted from office 11/7 for worsening PNA on xray and poor clinical improvement.   SIGNIFICANT EVENTS / STUDIES:  Admit 10/20-25 for HCAP/necrotizing pna-neg AFB/leoginella/strep/BC /sputum. Admit 10/16 -18>CAP tx w/ azith/rocep, OP ceftin   LINES / TUBES: 07/11/13 PICC R. >>  CULTURES: 07/24/13 BC x 2  07/24/13  Sputum CX >>  ANTIBIOTICS: 07/24/13 Zosyn >>  HISTORY OF PRESENT ILLNESS:   07/24/2013 Post Hospital follow up  Pt returns for follow up from hospital admit  Admitted 10/20 for HCAP-probable necrotizing PNA .  Treated with aggressive pulmonary hygiene, IV abx, steroids.  Preliminary AFB was neg. Legionella/strep pnuemon neg.  He was treated with aggressive abx and discharged w/ PICC line with Vancomycin x 2 weeks.  Since discharge he is not feeling much better.  Is wearing his O2 on regular basis.  Has persistent low grade fever ~100.  Seen by PCP 07/17/13 , BC repeated x 1 w/ no growth.  WBC is trending down from 18k >16.9K.  CXR today shows worsening consolidation on Riight.  Admitted from office for clinical failure on Vacomycin   PAST MEDICAL HISTORY :  Past Medical History  Diagnosis Date  . Emphysema   . Allergic rhinitis   . Prostate cancer   . Hypothyroid   . Cataract     s/p removal  . PNA (pneumonia)   . Chronic respiratory failure     oxygen 3L at home   Past Surgical History  Procedure Laterality Date  . Rotator cuff repair  [id number]    bilateral  . Transurethral resection of prostate  2001    x2  .  Tonsillectomy    . Adenoidectomy    . Seed implant for prostate cancer  2000   Prior to Admission medications   Medication Sig Start Date End Date Taking? Authorizing Provider  Calcium-Magnesium-Vitamin D (CITRACAL CALCIUM+D PO) Once a day     Historical Provider, MD  doxazosin (CARDURA) 8 MG tablet Take 8 mg by mouth at bedtime. 06/02/13   Salley Scarlet, MD  Ipratropium-Albuterol (COMBIVENT) 20-100 MCG/ACT AERS respimat Inhale 1 puff into the lungs every 6 (six) hours as needed for wheezing or shortness of breath. 06/26/13   Salley Scarlet, MD  levothyroxine (SYNTHROID, LEVOTHROID) 75 MCG tablet Take 1 tablet (75 mcg total) by mouth daily. 04/16/13   Salley Scarlet, MD  loratadine (CLARITIN) 5 MG chewable tablet Chew 5 mg by mouth daily as needed for allergies.     Historical Provider, MD  mometasone (NASONEX) 50 MCG/ACT nasal spray Place 2 sprays into the nose daily as needed.     Historical Provider, MD  Multiple Vitamins-Minerals ([employer] SPECTRAVITE SENIOR PO) Once a day     Historical Provider, MD  Omega-3 Fatty Acids (FISH OIL PO) Take 1,000 Units by mouth daily.     Historical Provider, MD  oxyCODONE-acetaminophen (PERCOCET/ROXICET) 5-325 MG per tablet Take 1 tablet by mouth every 6 (six) hours as needed. 07/17/13   Salley Scarlet, MD  SYMBICORT 160-4.5 MCG/ACT inhaler INHALE  2 PUFFS INTO THE LUNGS 2 (TWO) TIMES DAILY. 05/15/13   Barbaraann Share, MD  zolpidem (AMBIEN) 10 MG tablet Take 1 tablet by mouth at bedtime as needed. 07/22/13   Historical Provider, MD   Allergies  Allergen Reactions  . Morphine     REACTION: sweats    FAMILY HISTORY:  Family History  Problem Relation Age of Onset  . Heart disease Mother   . Prostate cancer Father    SOCIAL HISTORY:  reports that he quit smoking about 4 years ago. His smoking use included Cigarettes. He has a 75 pack-year smoking history. He does not have any smokeless tobacco history on file. He reports that he drinks about 0.6 ounces  of alcohol per week. He reports that he does not use illicit drugs.  REVIEW OF SYSTEMS:   Constitutional: No weight loss, night sweats,  +Fevers, chills, +fatigue, or lassitude.  HEENT: No headaches, Difficulty swallowing, Tooth/dental problems, or Sore throat,  No sneezing, itching, ear ache, nasal congestion, post nasal drip,  CV: No chest pain, Orthopnea, PND,  +swelling in lower extremities, anasarca, dizziness, palpitations, syncope.  GI No heartburn, indigestion, abdominal pain, nausea, vomiting, diarrhea, change in bowel habits, loss of appetite, bloody stools.  Resp: No chest wall deformity  Skin: no rash or lesions.  GU: no dysuria, change in color of urine, no urgency or frequency. No flank pain, no hematuria  MS: No joint pain or swelling. No decreased range of motion. No back pain.  Psych: No change in mood or affect. No depression or anxiety. No memory loss.  SUBJECTIVE:  No better   VITAL SIGNS: Temp:  [100.1 F (37.8 C)] 100.1 F (37.8 C) (11/07 1618) Pulse Rate:  [115] 115 (11/07 1618) BP: (114)/(64) 114/64 mmHg (11/07 1618) SpO2:  [93 %] 93 % (11/07 1618) Weight:  [131 lb (59.421 kg)] 131 lb (59.421 kg) (11/07 1618)  PHYSICAL EXAMINATION:  GEN: A/Ox3; pleasant , NAD  HEENT: /AT, EACs-clear, TMs-wnl, NOSE-clear, THROAT-clear, no lesions, no postnasal drip or exudate noted.  NECK: Supple w/ fair ROM; no JVD; normal carotid impulses w/o bruits; no thyromegaly or nodules palpated; no lymphadenopathy.  RESP Crackles on right noted. no accessory muscle use, no dullness to percussion  CARD: RRR-ST , no m/r/g ,tr peripheral edema, pulses intact, no cyanosis or clubbing.  PICC right  GI: Soft & nt; nml bowel sounds; no organomegaly or masses detected.  Musco: Warm bil, no deformities or joint swelling noted.  Neuro: alert, no focal deficits noted.  Skin: Warm, no lesions or rashes   CXR 07/24/2013 Worsening of pneumonia in the emphysematous right upper lobe.    No  results found for this basename: NA, K, CL, CO2, BUN, CREATININE, GLUCOSE,  in the last 168 hours No results found for this basename: HGB, HCT, WBC, PLT,  in the last 168 hours Dg Chest 2 View  07/24/2013   CLINICAL DATA:  Followup pneumonia. Short of breath.  EXAM: CHEST  2 VIEW  COMPARISON:  07/11/2013. 07/06/2013.  10/02/2012.  FINDINGS: Heart size remains normal. Right arm PICC has its tip in the SVC just above the right atrium. Emphysematous change again noted affecting the left lung without focal infiltrate. On the right, there is worsening of consolidation in the upper lobe with some volume loss. Mild patchy density persists in the lower lobe. No pleural fluid accumulation. Some air-fluid levels in the upper lobe probably relate to inflammation within bullous disease.  IMPRESSION: Worsening of pneumonia in the emphysematous  right upper lobe.   Electronically Signed   By: Paulina Fusi M.D.   On: 07/24/2013 16:17    ASSESSMENT / PLAN: 1.GEN: A/Ox3; pleasant , NAD  HEENT: [place]/AT, EACs-clear, TMs-wnl, NOSE-clear, THROAT-clear, no lesions, no postnasal drip or exudate noted.  NECK: Supple w/ fair ROM; no JVD; normal carotid impulses w/o bruits; no thyromegaly or nodules palpated; no lymphadenopathy.  RESP Crackles on right noted. no accessory muscle use, no dullness to percussion  CARD: RRR-ST , no m/r/g ,tr peripheral edema, pulses intact, no cyanosis or clubbing.  PICC right  GI: Soft & nt; nml bowel sounds; no organomegaly or masses detected.  Musco: Warm bil, no deformities or joint swelling noted.  Neuro: alert, no focal deficits noted.  Skin: Warm, no lesions or rashes   CXR 07/24/2013 Worsening of pneumonia in the emphysematous right upper lobe.  Assessment /Plan :  1. HCAP w/ necrotizing PNA  Recurrent admission with clinical OP failure on vancomycin Will expand coverage to include GNR coverage w/ Zosyn.  Pancx w/ BC x 2 and sputum cx  Cont pulmonary hygiene regimen , add flutter   and IS , Add nebs w/ albuterol/ipratropium  Cont PICC line  Labs pending  Check cxr in am   2. Chronic Respiratory Failure  Titrate O2 for sat >90%  3. COPD  Recent exacerbation now finished with steroid burst  Hold on additional steroids for now Cont aggressive pulmonary regimen  Will hold symbicort for now , add nebs w/ ipratropium/albuterol .   BEST PRACTICE:  HEP for DVT    PARRETT,TAMMY NP-C  Pulmonary and Critical Care Medicine [facility] Pager: [phone]  07/24/2013, 5:22 PM

## 2013-07-24 NOTE — Progress Notes (Signed)
ANTIBIOTIC CONSULT NOTE - INITIAL  Pharmacy Consult for:  Vancomycin, Zosyn Indication: HCAP / Necrotizing pneumonia  Allergies  Allergen Reactions  . Morphine     REACTION: sweats    Patient Measurements: Height: 5\' 6"  (167.6 cm) Weight: 131 lb (59.421 kg) IBW/kg (Calculated) : 63.8   Vital Signs: Temp: 98.3 F (36.8 C) (11/07 1845) Temp src: Oral (11/07 1845) BP: 112/65 mmHg (11/07 1900) Pulse Rate: 107 (11/07 1845)  Labs: Pending Estimated creatinine clearance:  59.8 mlmin (by Cockroft-Gault formula based on SCr of 0.76 drawn on 07/17/13).   Microbiology: Recent Results (from the past 720 hour(s))  URINE CULTURE     Status: None   Collection Time    06/30/13 12:06 PM      Result Value Range Status   Colony Count NO GROWTH   Final   Organism ID, Bacteria NO GROWTH   Final  INFLUENZA A AND B     Status: None   Collection Time    06/30/13 12:06 PM      Result Value Range Status   Source-INFBD NASAL   Final   Inflenza A Ag NEG  Negative Final   Influenza B Ag NEG  Negative Final  CULTURE, BLOOD (ROUTINE X 2)     Status: None   Collection Time    07/02/13  4:48 PM      Result Value Range Status   Specimen Description BLOOD RIGHT ANTECUBITAL   Final   Special Requests     Final   Value: BOTTLES DRAWN AEROBIC AND ANAEROBIC AEB=11CC ANA=6CC   Culture NO GROWTH 5 DAYS   Final   Report Status 07/07/2013 FINAL   Final  CULTURE, BLOOD (ROUTINE X 2)     Status: None   Collection Time    07/02/13  4:51 PM      Result Value Range Status   Specimen Description BLOOD LEFT ANTECUBITAL   Final   Special Requests     Final   Value: BOTTLES DRAWN AEROBIC AND ANAEROBIC AEB=12CC ANA=6CC   Culture NO GROWTH 5 DAYS   Final   Report Status 07/07/2013 FINAL   Final  CULTURE, BLOOD (ROUTINE X 2)     Status: None   Collection Time    07/06/13 11:16 AM      Result Value Range Status   Specimen Description BLOOD RIGHT ANTECUBITAL   Final   Special Requests     Final   Value:  BOTTLES DRAWN AEROBIC AND ANAEROBIC 10CC  IMMUNE:COMPROMISED   Culture NO GROWTH 6 DAYS   Final   Report Status 07/12/2013 FINAL   Final  CULTURE, BLOOD (ROUTINE X 2)     Status: None   Collection Time    07/06/13 11:17 AM      Result Value Range Status   Specimen Description BLOOD LEFT ANTECUBITAL   Final   Special Requests     Final   Value: BOTTLES DRAWN AEROBIC AND ANAEROBIC AEB=10CC ANA=8CC IMMUNE:COMPROMISED   Culture NO GROWTH 6 DAYS   Final   Report Status 07/12/2013 FINAL   Final  URINE CULTURE     Status: None   Collection Time    07/06/13 12:10 PM      Result Value Range Status   Specimen Description URINE, CLEAN CATCH   Final   Special Requests NONE   Final   Culture  Setup Time     Final   Value: 07/07/2013 00:51     Performed at [facility]  Colony Count     Final   Value: NO GROWTH     Performed at [facility]   Culture     Final   Value: NO GROWTH     Performed at [facility]   Report Status 07/07/2013 FINAL   Final  MRSA PCR SCREENING     Status: None   Collection Time    07/06/13  1:35 PM      Result Value Range Status   MRSA by PCR NEGATIVE  NEGATIVE Final   Comment:            The GeneXpert MRSA Assay (FDA     approved for NASAL specimens     only), is one component of a     comprehensive MRSA colonization     surveillance program. It is not     intended to diagnose MRSA     infection nor to guide or     monitor treatment for     MRSA infections.  CULTURE, EXPECTORATED SPUTUM-ASSESSMENT     Status: None   Collection Time    07/07/13 11:20 PM      Result Value Range Status   Specimen Description SPUTUM   Final   Special Requests NONE   Final   Sputum evaluation     Final   Value: THIS SPECIMEN IS ACCEPTABLE. RESPIRATORY CULTURE REPORT TO FOLLOW.   Report Status 07/07/2013 FINAL   Final  AFB CULTURE WITH SMEAR     Status: None   Collection Time    07/07/13 11:20 PM      Result Value Range Status   Specimen  Description SPUTUM EXPECTORATED   Final   Special Requests NONE   Final   ACID FAST SMEAR     Final   Value: NO ACID FAST BACILLI SEEN     Performed at [facility]   Culture     Final   Value: CULTURE WILL BE EXAMINED FOR 6 WEEKS BEFORE ISSUING A FINAL REPORT     Performed at [facility]   Report Status PENDING   Incomplete  CULTURE, RESPIRATORY (NON-EXPECTORATED)     Status: None   Collection Time    07/07/13 11:20 PM      Result Value Range Status   Specimen Description SPUTUM EXPECTORATED   Final   Special Requests NONE   Final   Gram Stain     Final   Value: ABUNDANT WBC PRESENT,BOTH PMN AND MONONUCLEAR     RARE SQUAMOUS EPITHELIAL CELLS PRESENT     RARE YEAST     Performed at [facility]   Culture     Final   Value: MODERATE CANDIDA ALBICANS     Performed at [facility]   Report Status 07/10/2013 FINAL   Final  AFB CULTURE WITH SMEAR     Status: None   Collection Time    07/09/13  9:30 AM      Result Value Range Status   Specimen Description SPUTUM EXPECTORATED   Final   Special Requests NONE   Final   ACID FAST SMEAR     Final   Value: NO ACID FAST BACILLI SEEN     Performed at [facility]   Culture     Final   Value: CULTURE WILL BE EXAMINED FOR 6 WEEKS BEFORE ISSUING A FINAL REPORT     Performed at [facility]   Report Status PENDING   Incomplete  CULTURE, BLOOD (SINGLE)     Status: None   Collection Time    07/17/13  4:17 PM      Result Value Range Status   Organism ID, Bacteria NO GROWTH 5 DAYS   Final    Medical History: Past Medical History  Diagnosis Date  . Emphysema   . Allergic rhinitis   . Prostate cancer   . Hypothyroid   . Cataract     s/p removal  . PNA (pneumonia)   . Chronic respiratory failure     oxygen 3L at home    Medications:  Scheduled:  . albuterol  2.5 mg Nebulization Q6H  . doxazosin  8 mg Oral QHS  . heparin  5,000 Units Subcutaneous Q8H  . ipratropium  0.5 mg  Nebulization Q6H  . [START ON 07/25/2013] levothyroxine  75 mcg Oral QAC breakfast  . piperacillin-tazobactam (ZOSYN)  IV  3.375 g Intravenous Q8H  . sodium chloride  3 mL Intravenous Q12H   Assessment:  Asked to assist with Zosyn therapy for this 79 year-old male with severe COPD and recently treated with Vancomycin for a total of 14 days (including 10 days of outpatient therapy) for HCAP / necrotizing pneumonia.  Mr. Severs has been re-admitted for the same indication due to worsening condition since discharge on 10/20.  Goal of Therapy:  Eradication of infection  Plan:  Begin Zosyn 3.375 grams IV every 8 hours, each dose infused over 4 hours.  Polo Riley R.Ph. 07/24/2013 8:22 PM

## 2013-07-24 NOTE — Assessment & Plan Note (Signed)
Worsening HCAP w/ probable necrotizing PNA  Will need readmission due to worsening cxr aspdz and persistent fevers.  Will admit to [facility] hospital .

## 2013-07-24 NOTE — Telephone Encounter (Signed)
I have already done this yesterday.

## 2013-07-24 NOTE — Progress Notes (Addendum)
RN calling elink  Say HR sometimes 200. Walked over to central cardiac monitoring station; HR is sinus and suspect p waves being caputred to report 200  Plan 12 lead ekg; fax number given   Addendum 9:02 PM: EKG shows HR 116. Nil acute. Plan: monitor   Dr. Kalman Shan, M.D., [facility]C.P Pulmonary and Critical Care Medicine Staff Physician [facility] Pager: [phone], If no answer or between  15:00h - 7:00h: call [phone]  07/24/2013 8:23 PM

## 2013-07-24 NOTE — Progress Notes (Signed)
  Subjective:    Patient ID: John Black, male    DOB: 05-22-1929, 79 y.o.   [name]: [id number]  HPI 79 yo former smoker with known hs COPD with chronic respiratory failure.    07/24/2013 Post Hospital follow up  Pt returns for follow up from hospital admit  Admitted 10/20 for HCAP-probable necrotizing PNA .  Treated with aggressive pulmonary hygiene, IV abx, steroids. Preliminary AFB was neg. Legionella/strep pnuemon neg.  He was treated with aggressive abx and discharged w/ PICC line with Vancomycin x 2 weeks.   Since discharge he is not feeling much better.  Is wearing his O2 on regular basis.  Has persistent low grade fever ~100.  Seen by PCP 07/17/13  , BC repeated x 1 w/ no growth.   WBC is trending down from 18k >16.9K.  CXR today shows worsening consolidation on Riight.    Review of Systems .Constitutional:   No  weight loss, night sweats,   +Fevers, chills, +fatigue, or  lassitude.  HEENT:   No headaches,  Difficulty swallowing,  Tooth/dental problems, or  Sore throat,                No sneezing, itching, ear ache, nasal congestion, post nasal drip,   CV:  No chest pain,  Orthopnea, PND,  +swelling in lower extremities, anasarca, dizziness, palpitations, syncope.   GI  No heartburn, indigestion, abdominal pain, nausea, vomiting, diarrhea, change in bowel habits, loss of appetite, bloody stools.   Resp:    No chest wall deformity  Skin: no rash or lesions.  GU: no dysuria, change in color of urine, no urgency or frequency.  No flank pain, no hematuria   MS:  No joint pain or swelling.  No decreased range of motion.  No back pain.  Psych:  No change in mood or affect. No depression or anxiety.  No memory loss.         Objective:   Physical Exam GEN: A/Ox3; pleasant , NAD   HEENT:  [place]/AT,  EACs-clear, TMs-wnl, NOSE-clear, THROAT-clear, no lesions, no postnasal drip or exudate noted.   NECK:  Supple w/ fair ROM; no JVD; normal carotid impulses w/o bruits;  no thyromegaly or nodules palpated; no lymphadenopathy.  RESP  Crackles on right noted. no accessory muscle use, no dullness to percussion  CARD:  RRR-ST , no m/r/g  ,tr  peripheral edema, pulses intact, no cyanosis or clubbing. PICC right   GI:   Soft & nt; nml bowel sounds; no organomegaly or masses detected.  Musco: Warm bil, no deformities or joint swelling noted.   Neuro: alert, no focal deficits noted.    Skin: Warm, no lesions or rashes  CXR 07/24/2013 Worsening of pneumonia in the emphysematous right upper lobe.          Assessment & Plan:

## 2013-07-24 NOTE — Progress Notes (Signed)
CCMD on call MD notified of pt tachycardia. Pt in no distress conversing with family members, sitting in chair.  NT obtaining EKG.

## 2013-07-24 NOTE — H&P (Signed)
The pt has a h/o necrotizing pna with severe underlying copd.  Sent home on vanco but no GNR coverage.  He has worsened since being out of hospital, with progressive infiltrates.  He needs aggressive treatment with IV abx to cover GNR, and possibly pseudomonas.  Will need admission for a few days to get treatment started, then d/c home with abx for prolonged course.

## 2013-07-24 NOTE — Telephone Encounter (Signed)
Please advise KC thanks 

## 2013-07-25 ENCOUNTER — Encounter (HOSPITAL_COMMUNITY): Payer: Self-pay | Admitting: *Deleted

## 2013-07-25 LAB — BASIC METABOLIC PANEL
BUN: 17 mg/dL (ref 6–23)
CO2: 27 mEq/L (ref 19–32)
Calcium: 9.2 mg/dL (ref 8.4–10.5)
Chloride: 97 mEq/L (ref 96–112)
Creatinine, Ser: 0.81 mg/dL (ref 0.50–1.35)
GFR calc Af Amer: >90 mL/min (ref >90)
GFR calc non Af Amer: 81 mL/min — ABNORMAL LOW (ref >90)
Glucose, Bld: 87 mg/dL (ref 70–99)
Potassium: 4 mEq/L (ref 3.5–5.1)
Sodium: 134 mEq/L — ABNORMAL LOW (ref 135–145)

## 2013-07-25 LAB — CBC
HCT: 32.4 % — ABNORMAL LOW (ref 39.0–52.0)
Hemoglobin: 10.7 g/dL — ABNORMAL LOW (ref 13.0–17.0)
MCH: 28.2 pg (ref 26.0–34.0)
MCHC: 33 g/dL (ref 30.0–36.0)
MCV: 85.3 fL (ref 78.0–100.0)
Platelets: 347 10*3/uL (ref 150–400)
RBC: 3.8 MIL/uL — ABNORMAL LOW (ref 4.22–5.81)
RDW: 14.5 % (ref 11.5–15.5)
WBC: 9.5 10*3/uL (ref 4.0–10.5)

## 2013-07-25 MED ORDER — BUDESONIDE 0.25 MG/2ML IN SUSP
0.2500 mg | Freq: Four times a day (QID) | RESPIRATORY_TRACT | Status: DC
  Administered 2013-07-25 – 2013-07-27 (×6): 0.25 mg via RESPIRATORY_TRACT
  Filled 2013-07-25 (×13): qty 2

## 2013-07-25 NOTE — Progress Notes (Signed)
Subjective: Feeling better overnight.  No increased wob.  Objective: Vital signs in last 24 hours: Blood pressure 94/55, pulse 102, temperature 98.2 F (36.8 C), temperature source Oral, resp. rate 20, height 5\' 6"  (1.676 m), weight 57.743 kg (127 lb 4.8 oz), SpO2 96.00%.  Intake/Output from previous day: 11/07 0701 - 11/08 0700 In: -  Out: 375 [Urine:375]   Physical Exam:   thin male in nad Nose without purulence or d/c noted. OP clear Neck without LN or TMG Chest with scattered crackles and rhonchi, no wheezing. Cor with rrr LE without edema, no cyanosis Alert and oriented, moves all 4.    Lab Results:  Recent Labs  07/24/13 2008 07/25/13 0430  WBC 10.1 9.5  HGB 11.5* 10.7*  HCT 34.6* 32.4*  PLT 371 347   BMET  Recent Labs  07/24/13 2008 07/25/13 0430  NA 132* 134*  K 4.3 4.0  CL 94* 97  CO2 27 27  GLUCOSE 175* 87  BUN 17 17  CREATININE 0.73 0.81  CALCIUM 9.2 9.2    Studies/Results: Dg Chest 2 View  07/24/2013   CLINICAL DATA:  Followup pneumonia. Short of breath.  EXAM: CHEST  2 VIEW  COMPARISON:  07/11/2013. 07/06/2013.  10/02/2012.  FINDINGS: Heart size remains normal. Right arm PICC has its tip in the SVC just above the right atrium. Emphysematous change again noted affecting the left lung without focal infiltrate. On the right, there is worsening of consolidation in the upper lobe with some volume loss. Mild patchy density persists in the lower lobe. No pleural fluid accumulation. Some air-fluid levels in the upper lobe probably relate to inflammation within bullous disease.  IMPRESSION: Worsening of pneumonia in the emphysematous right upper lobe.   Electronically Signed   By: Paulina Fusi M.D.   On: 07/24/2013 16:17    Assessment/Plan:  1) Necrotizing PNA right lung.  Pt had worsening clinically and radiographically after Rx at home with IV vanco alone.  He needs more aggressive GNR coverage.   Doing well today with no increased wob or fever.   Cultures pending.  -f/u cultures -continue current abx, and will consider d/c home next week to finish iv abx as outpt.  2) copd with chronic respiratory failure The pt has no increased wob or wheezing on exam.  Will avoid systemic steroids if able  -continue nebulized BD and ICS  Barbaraann Share, M.D. 07/25/2013, 1:05 PM

## 2013-07-26 NOTE — Progress Notes (Signed)
Subjective: Continues to improve.  No significant cough or purulence Remains afebrile.  Objective: Vital signs in last 24 hours: Blood pressure 94/56, pulse 88, temperature 97.9 F (36.6 C), temperature source Oral, resp. rate 18, height 5\' 6"  (1.676 m), weight 57.1 kg (125 lb 14.1 oz), SpO2 95.00%.  Intake/Output from previous day: 11/08 0701 - 11/09 0700 In: 720 [P.O.:720] Out: 1125 [Urine:1125]   Physical Exam:   thin male in nad Nose without purulence or d/c noted. OP clear Neck without LN or TMG Chest with scattered crackles primarily right base and rhonchi, no wheezing. Cor with rrr LE without edema, no cyanosis Alert and oriented, moves all 4.    Lab Results:  Recent Labs  07/24/13 2008 07/25/13 0430  WBC 10.1 9.5  HGB 11.5* 10.7*  HCT 34.6* 32.4*  PLT 371 347   BMET  Recent Labs  07/24/13 2008 07/25/13 0430  NA 132* 134*  K 4.3 4.0  CL 94* 97  CO2 27 27  GLUCOSE 175* 87  BUN 17 17  CREATININE 0.73 0.81  CALCIUM 9.2 9.2    Studies/Results: Dg Chest 2 View  07/24/2013   CLINICAL DATA:  Followup pneumonia. Short of breath.  EXAM: CHEST  2 VIEW  COMPARISON:  07/11/2013. 07/06/2013.  10/02/2012.  FINDINGS: Heart size remains normal. Right arm PICC has its tip in the SVC just above the right atrium. Emphysematous change again noted affecting the left lung without focal infiltrate. On the right, there is worsening of consolidation in the upper lobe with some volume loss. Mild patchy density persists in the lower lobe. No pleural fluid accumulation. Some air-fluid levels in the upper lobe probably relate to inflammation within bullous disease.  IMPRESSION: Worsening of pneumonia in the emphysematous right upper lobe.   Electronically Signed   By: Paulina Fusi M.D.   On: 07/24/2013 16:17    Assessment/Plan:  1) Necrotizing PNA right lung.  Pt had worsening clinically and radiographically after Rx at home with IV vanco alone.  He needs more aggressive GNR  coverage.   Doing well today with no increased wob or fever.  Anticipate d/c home tomorrow.  -anticipate d/c home in am with IV zosyn at home thru picc for another 7 days.  Will then transition over to augmentin 875 bid and cipro 750 bid for at least another week of treatment.  I will need to see him back week after next.   2) copd with chronic respiratory failure The pt has no increased wob or wheezing on exam.  Will avoid systemic steroids if able  -continue nebulized BD and ICS    Barbaraann Share, M.D. 07/26/2013, 1:18 PM

## 2013-07-27 MED ORDER — BUDESONIDE 0.25 MG/2ML IN SUSP: 0.2500 mg | Freq: Four times a day (QID) | RESPIRATORY_TRACT | Status: DC

## 2013-07-27 MED ORDER — AMOXICILLIN-POT CLAVULANATE 875-125 MG PO TABS: 1.0000 | ORAL_TABLET | Freq: Two times a day (BID) | ORAL | Status: DC

## 2013-07-27 MED ORDER — HEPARIN SOD (PORK) LOCK FLUSH 100 UNIT/ML IV SOLN
250.0000 [IU] | INTRAVENOUS | Status: AC | PRN
  Administered 2013-07-27: 500 [IU]

## 2013-07-27 MED ORDER — IPRATROPIUM BROMIDE 0.02 % IN SOLN: 0.5000 mg | Freq: Four times a day (QID) | RESPIRATORY_TRACT | Status: DC

## 2013-07-27 MED ORDER — CIPROFLOXACIN HCL 750 MG PO TABS: 750.0000 mg | ORAL_TABLET | Freq: Two times a day (BID) | ORAL | Status: DC

## 2013-07-27 MED ORDER — ALBUTEROL SULFATE (5 MG/ML) 0.5% IN NEBU: 2.5000 mg | INHALATION_SOLUTION | Freq: Four times a day (QID) | RESPIRATORY_TRACT | Status: DC

## 2013-07-27 MED ORDER — PIPERACILLIN-TAZOBACTAM 3.375 G IVPB: 3.3750 g | Freq: Three times a day (TID) | INTRAVENOUS | Status: AC

## 2013-07-27 NOTE — Discharge Summary (Signed)
Physician Discharge Summary  Patient ID: John Black [name]: [id number] DOB/AGE: 1929-08-16 79 y.o.  Admit date: 07/24/2013 Discharge date: 07/27/2013  Problem List Active Problems:   COPD (chronic obstructive pulmonary disease) with emphysema   Hypothyroidism   Acute exacerbation of emphysema   HCAP (healthcare-associated pneumonia)/ Probable necrotizing PNA   HPI: 07/24/2013 Post Hospital follow up  Pt returns for follow up from hospital admit  Admitted 10/20 for HCAP-probable necrotizing PNA .  Treated with aggressive pulmonary hygiene, IV abx, steroids.  Preliminary AFB was neg. Legionella/strep pnuemon neg.  He was treated with aggressive abx and discharged w/ PICC line with Vancomycin x 2 weeks.  Since discharge he is not feeling much better.  Is wearing his O2 on regular basis.  Has persistent low grade fever ~100.  Seen by PCP 07/17/13 , BC repeated x 1 w/ no growth.  WBC is trending down from 18k >16.9K.  CXR today shows worsening consolidation on Riight.  Admitted from office for clinical failure on [facility] Course: Assessment/Plan:  1) Necrotizing PNA right lung. Pt had worsening clinically and radiographically after Rx at home with IV vanco alone. He needs more aggressive GNR coverage.  Doing well today with no increased wob or fever.  - d/c home with IV zosyn at home thru picc for another 7 days. Will then transition over to augmentin 875 bid and cipro 750 bid for at least another week of treatment.  2) copd with chronic respiratory failure  The pt has no increased wob or wheezing on exam. Will avoid systemic steroids if able  -continue nebulized BD and ICS        Labs at discharge Lab Results  Component Value Date   CREATININE 0.81 07/25/2013   BUN 17 07/25/2013   NA 134* 07/25/2013   K 4.0 07/25/2013   CL 97 07/25/2013   CO2 27 07/25/2013   Lab Results  Component Value Date   WBC 9.5 07/25/2013   HGB 10.7* 07/25/2013   HCT 32.4*  07/25/2013   MCV 85.3 07/25/2013   PLT 347 07/25/2013   Lab Results  Component Value Date   ALT 43 07/24/2013   AST 28 07/24/2013   ALKPHOS 84 07/24/2013   BILITOT 0.2* 07/24/2013   No results found for this basename: INR,  PROTIME    Current radiology studies CHEST 2 VIEW  COMPARISON: 07/11/2013. 07/06/2013. 10/02/2012.  Worsening of pneumonia in the emphysematous right upper lobe.    Disposition:  06-Home-Health Care Svc       Future Appointments Provider Department Dept Phone   08/07/2013 11:30 AM Julio Sicks, NP [facility] [phone]   08/28/2013 11:30 AM Salley Scarlet, MD [facility] [phone]   09/21/2013 10:30 AM Barbaraann Share, MD  [facility] [phone]       Medication List    STOP taking these medications       FISH OIL PO     SYMBICORT 160-4.5 MCG/ACT inhaler  Generic drug:  budesonide-formoterol      TAKE these medications       acetaminophen 500 MG tablet  Commonly known as:  TYLENOL  Take 1,000 mg by mouth every 6 (six) hours as needed.     albuterol (5 MG/ML) 0.5% nebulizer solution  Commonly known as:  PROVENTIL  Take 0.5 mLs (2.5 mg total) by nebulization every 6 (six) hours.     amoxicillin-clavulanate 875-125 MG per tablet  Commonly known as:  AUGMENTIN  Take 1 tablet by mouth 2 (two) times daily.  Start taking on:  08/05/2013     budesonide 0.25 MG/2ML nebulizer solution  Commonly known as:  PULMICORT  Take 2 mLs (0.25 mg total) by nebulization every 6 (six) hours.     ciprofloxacin 750 MG tablet  Commonly known as:  CIPRO  Take 1 tablet (750 mg total) by mouth 2 (two) times daily.  Start taking on:  08/05/2013     CITRACAL CALCIUM+D PO  Once a day     [employer] SPECTRAVITE SENIOR PO  Once a day     doxazosin 8 MG tablet  Commonly known as:  CARDURA  Take 8 mg by mouth at bedtime.     ibuprofen 400 MG tablet  Commonly known as:  ADVIL,MOTRIN  Take 400 mg by mouth every 6 (six)  hours as needed.     ipratropium 0.02 % nebulizer solution  Commonly known as:  ATROVENT  Take 2.5 mLs (0.5 mg total) by nebulization every 6 (six) hours.     Ipratropium-Albuterol 20-100 MCG/ACT Aers respimat  Commonly known as:  COMBIVENT  Inhale 1 puff into the lungs every 6 (six) hours as needed for wheezing or shortness of breath.     levothyroxine 75 MCG tablet  Commonly known as:  SYNTHROID, LEVOTHROID  Take 1 tablet (75 mcg total) by mouth daily.     oxyCODONE-acetaminophen 5-325 MG per tablet  Commonly known as:  PERCOCET/ROXICET  Take 1 tablet by mouth every 6 (six) hours as needed.     piperacillin-tazobactam 3.375 GM/50ML IVPB  Commonly known as:  ZOSYN  Inject 50 mLs (3.375 g total) into the vein every 8 (eight) hours.     zolpidem 10 MG tablet  Commonly known as:  AMBIEN  Take 1 tablet by mouth at bedtime as needed.       Follow-up Information   Follow up with Barbaraann Share, MD. Schedule an appointment as soon as possible for a visit on 07/27/2013.   Specialty:  Pulmonary Disease   Contact information:   [address] [place] [address] [phone]       Follow up with PARRETT,TAMMY, NP On 08/07/2013. (11:30 am)    Specialty:  Nurse Practitioner   Contact information:   [address] [place] [address] [phone]        Discharged Condition: good  Time spent on discharge greater than 40 minutes.  Vital signs at Discharge. Temp:  [98 F (36.7 C)-98.1 F (36.7 C)] 98.1 F (36.7 C) (11/10 0540) Pulse Rate:  [91-94] 91 (11/10 0540) Resp:  [18] 18 (11/10 0540) BP: (91-98)/(51-61) 91/51 mmHg (11/10 0540) SpO2:  [93 %-95 %] 93 % (11/10 0925) Weight:  [126 lb 8.7 oz (57.4 kg)] 126 lb 8.7 oz (57.4 kg) (11/10 0540) Office follow up Special Information or instructions. See T. Parrett ANP-C with chest x ray and needs to see Dr. Shelle Iron for follow up. Two weeks of abx will have been completed . Signed: Brett Canales Minor ACNP [facility] PCCM Pager  [phone] till 3 pm If no answer page [phone]  Independently examined pt, evaluated data & formulated above discharge care plan with NP who scribed this note & edited by me.  ALVA,RAKESH V.  07/27/2013, 11:15 AM

## 2013-07-27 NOTE — Progress Notes (Signed)
[facility]  Patient Status: Active (receiving services up to time of hospitalization)  [facility] is providing the following services: RN.  Pt was on IV abx and finished his last dose on 07/21/13 at home.    If patient discharges after hours, please call [phone].   Lanae Crumbly 07/27/2013, 9:31 AM

## 2013-07-27 NOTE — Progress Notes (Signed)
Give COPD GOLD card [phone].

## 2013-07-27 NOTE — Telephone Encounter (Signed)
I have checked and verified that this has been signed, faxed and placed in his scan folder.  Message to be signed off.

## 2013-07-27 NOTE — Progress Notes (Signed)
CARE MANAGEMENT NOTE 07/27/2013  Patient:  John Black,John Black   Account Number:  [id number]  Date Initiated:  07/25/2013  Documentation initiated by:  [facility]  Subjective/Objective Assessment:   79 year old male admitted with necrotizing PNA.     Action/Plan:   From home.  Needs HHRN for IV antibiotics.   Anticipated DC Date:  07/27/2013   Anticipated DC Plan:  HOME W HOME HEALTH SERVICES      DC Planning Services  CM consult      Choice offered to / List presented to:  C-1 Patient        HH arranged  HH-1 RN      [facility]  [facility]   Status of service:  In process, will continue to follow  Medicare Important Message given?  NA - LOS <3 / Initial given by admissions  Discharge Disposition:  HOME W HOME HEALTH SERVICES  Per UR Regulation:  Reviewed for med. necessity/level of care/duration of stay  Comments:  07/27/13 Algernon Huxley RN BSN [phone] Pt needs IV antibiotics at d/c .He recently was on Vancomycin at home and [facility] was administering it. He wishes to use them again. Referral made.

## 2013-07-27 NOTE — Telephone Encounter (Signed)
Ashytn can this be signed off? thanks

## 2013-07-31 LAB — CULTURE, BLOOD (ROUTINE X 2)
Culture: NO GROWTH
Culture: NO GROWTH

## 2013-08-03 ENCOUNTER — Telehealth: Payer: Self-pay | Admitting: Pulmonary Disease

## 2013-08-03 DIAGNOSIS — J189 Pneumonia, unspecified organism: Secondary | ICD-10-CM

## 2013-08-03 NOTE — Telephone Encounter (Signed)
Spoke to pt's wife. States that the pt has been running fevers on and off since 08/01/13. This AM at 6:50, his temp was 100.4. She gave him 2 ibuprofen and it has since come down. Temps have been ranging from 99.7-100.4.  They would like KC recs.  KC - please advise. Thanks.

## 2013-08-03 NOTE — Telephone Encounter (Signed)
See if his picc line is out yet.  This should be the last day of his iv abx.  Send order to home health to stop iv abx and d.c picc line TODAY.  Let pt know I always worry about that getting infected.  Then he needs to start on his oral abx as outlined on his discharge summary.

## 2013-08-03 NOTE — Telephone Encounter (Signed)
Spouse aware of recs. Order sent. Nothing further needed. Staff message sent to [facility]

## 2013-08-07 ENCOUNTER — Other Ambulatory Visit (HOSPITAL_COMMUNITY): Payer: Self-pay | Admitting: Diagnostic Radiology

## 2013-08-07 ENCOUNTER — Other Ambulatory Visit: Payer: Self-pay | Admitting: Adult Health

## 2013-08-07 ENCOUNTER — Encounter: Payer: Self-pay | Admitting: Adult Health

## 2013-08-07 ENCOUNTER — Ambulatory Visit (INDEPENDENT_AMBULATORY_CARE_PROVIDER_SITE_OTHER)
Admission: RE | Admit: 2013-08-07 | Discharge: 2013-08-07 | Disposition: A | Discharge status: Home / Self Care | Payer: Medicare Other | Source: Ambulatory Visit | Attending: Adult Health | Admitting: Adult Health

## 2013-08-07 ENCOUNTER — Ambulatory Visit (INDEPENDENT_AMBULATORY_CARE_PROVIDER_SITE_OTHER): Payer: Medicare Other | Admitting: Adult Health

## 2013-08-07 VITALS — BP 118/74 | Temp 99.2°F | Ht 66.0 in | Wt 129.0 lb

## 2013-08-07 DIAGNOSIS — J189 Pneumonia, unspecified organism: Secondary | ICD-10-CM

## 2013-08-07 DIAGNOSIS — J438 Other emphysema: Secondary | ICD-10-CM

## 2013-08-07 DIAGNOSIS — J439 Emphysema, unspecified: Secondary | ICD-10-CM

## 2013-08-07 DIAGNOSIS — J850 Gangrene and necrosis of lung: Secondary | ICD-10-CM

## 2013-08-07 IMAGING — CT CT CHEST W/O CM
2 of 4 series · 15 of 36 positions shown, 18 images · non-contrast
Comparison: Chest radiograph [DATE] and [DATE] as well as
[DATE], chest CT [DATE]

CLINICAL DATA: followup necrotizing pneumonia, shortness of breath

EXAM:
CT CHEST WITHOUT CONTRAST
TECHNIQUE: Multidetector CT imaging of the chest was performed following the
standard protocol without IV contrast.

[Series 2: chest routine with · axial · 0.71mm/px · z∈[-269,+1]mm · 12 of 64 slices shown, 15 images]
[im 5/64  mediastinal]
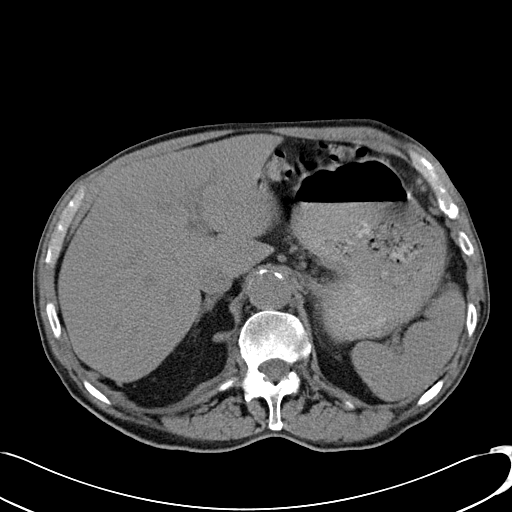
[im 5/64  lung]
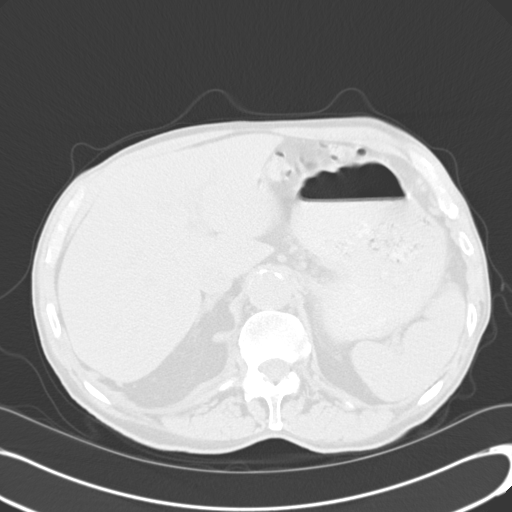
[im 10/64  lung]
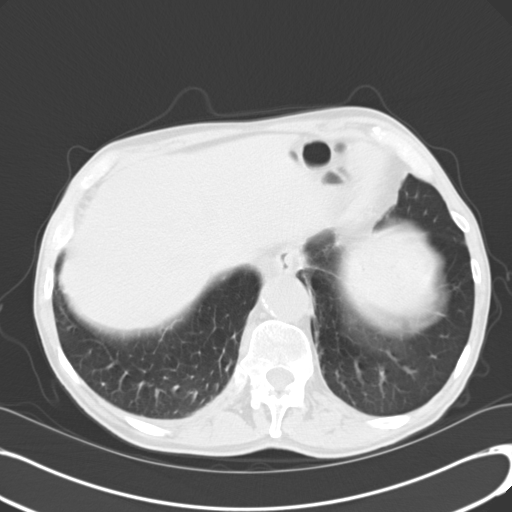
[im 15/64  lung]
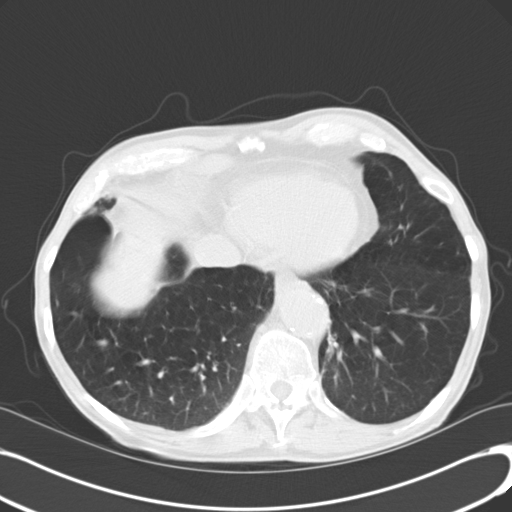
[im 20/64  lung]
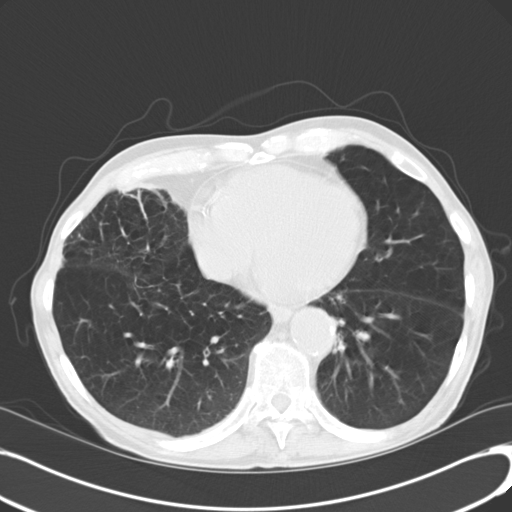
[im 25/64  mediastinal]
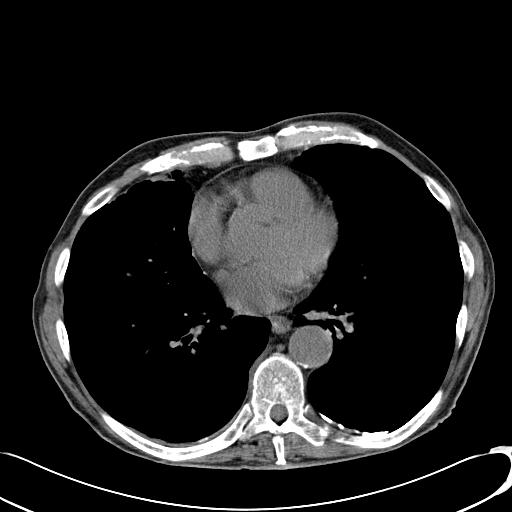
[im 25/64  lung]
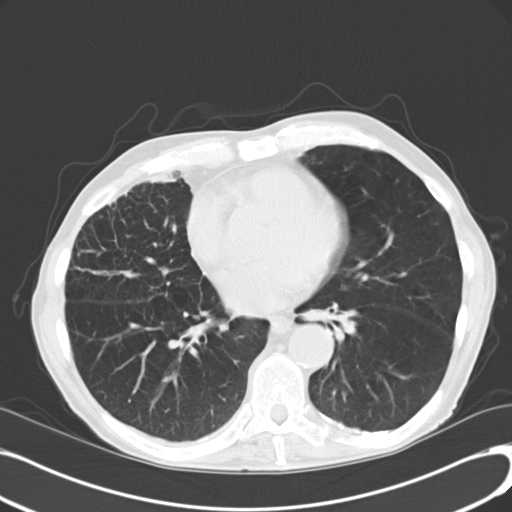
[im 30/64  lung]
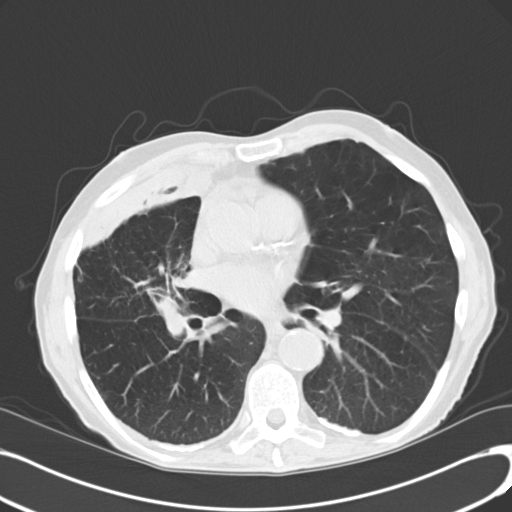
[im 34/64  lung]
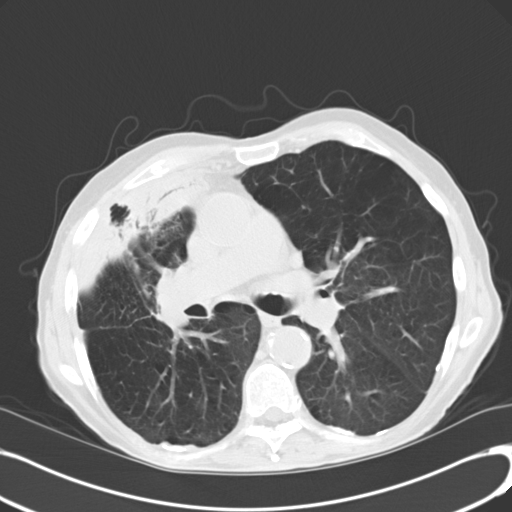
[im 39/64  lung]
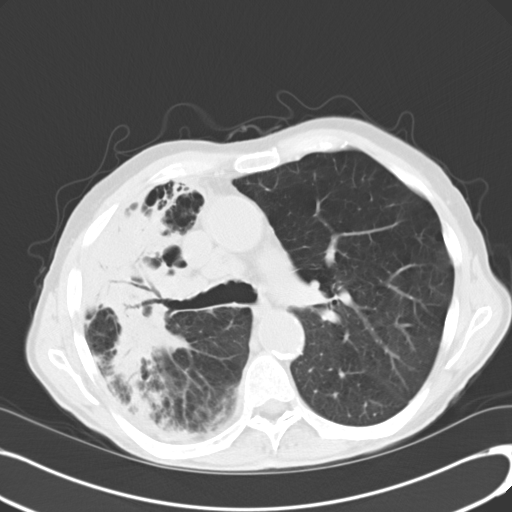
[im 44/64  mediastinal]
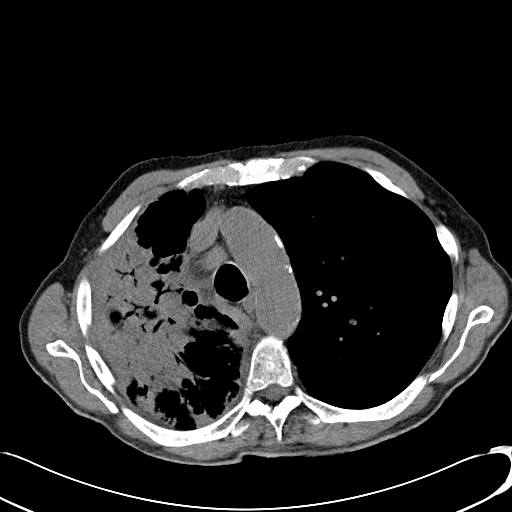
[im 44/64  lung]
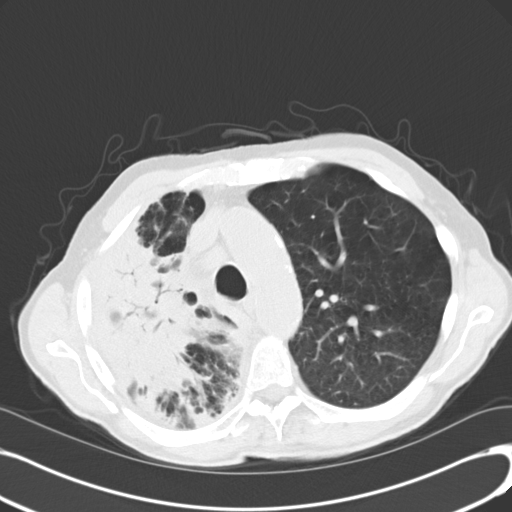
[im 49/64  lung]
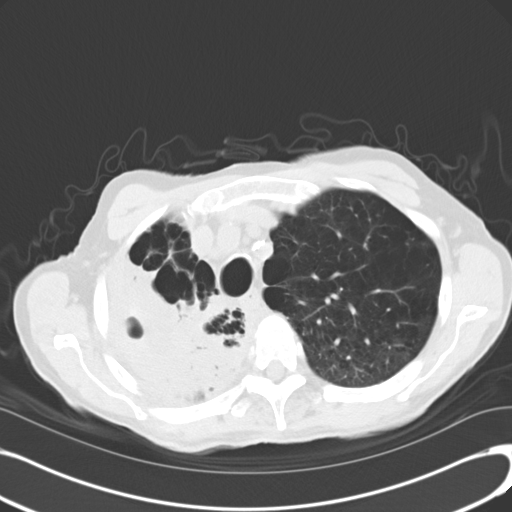
[im 54/64  lung]
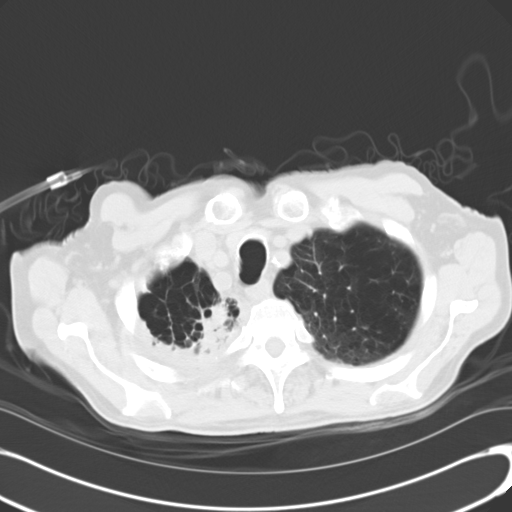
[im 59/64  lung]
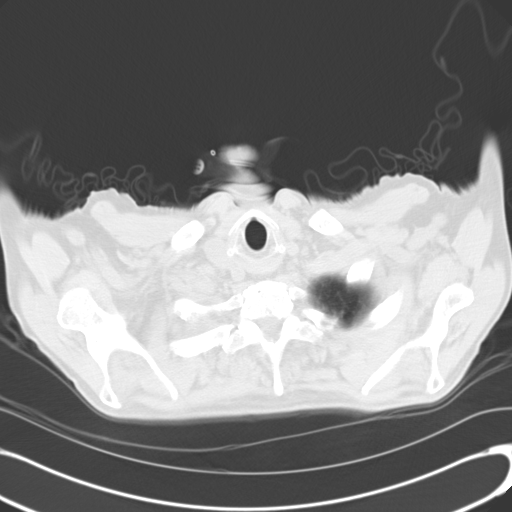

[Series 602: cor · coronal · 0.71mm/px · 3 of 114 slices shown]
[im 23/114  lung]
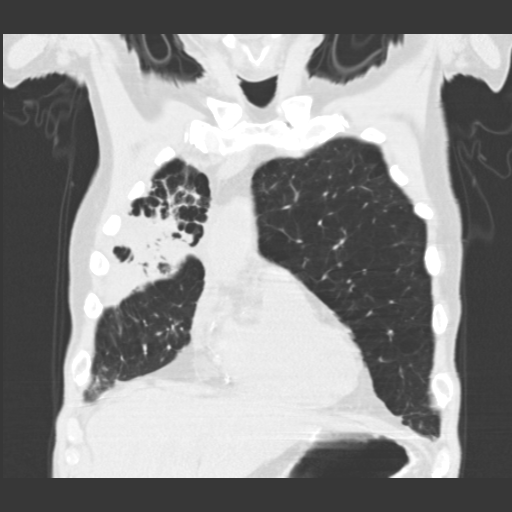
[im 46/114  lung]
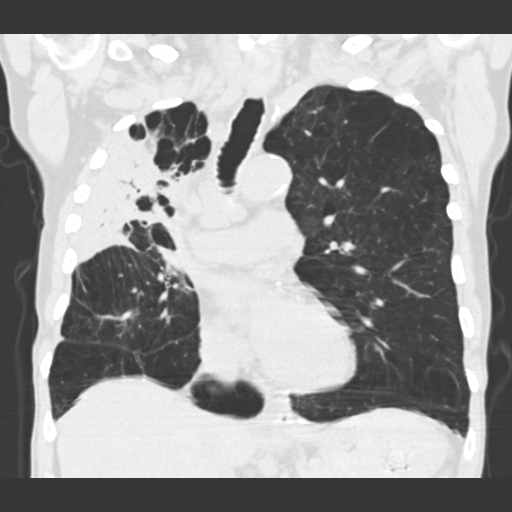
[im 68/114  lung]
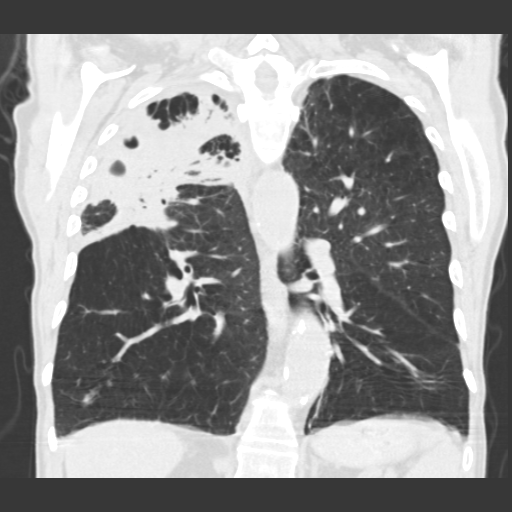

[15 of 36 positions shown; findings below may reference images not displayed]

FINDINGS: There is dense consolidation throughout the right upper lobe. The
process again spares the middle and lower lobes, but shows much
greater severity of consolidation and is much more extensive than it
was previously. There is volume loss now in the right upper lobe
with elevation of the fissures. There are dense air bronchograms
throughout much of the right upper lobe, and there are at least 2
cavities demonstrating air-fluid levels. The 1st of these is on
series 2, image number 16, and measures about 2 cm. The 2nd is on
image number 20 and measures about 3 cm. There is no pleural
effusion. There is no evidence of chest wall destruction.

Bilateral calcified pleural plaques are again identified. Diffuse
emphysematous change is stable. There is again noted to be
adenopathy with the largest lymph node located in the precarinal
area, measuring in short axis 2.5 cm. It is unchanged as are other
smaller lymph nodes.

No pericardial or cardiac abnormality of acute nature. There is
coronary arterial calcification. There is calcification of the aorta
again identified. There are no acute osseous abnormalities. Scans
through the upper abdomen show no acute findings.
IMPRESSION: Increase in extent and severity of necrotic right upper lobe
pneumonia. There are now cavitary areas within the consolidative
process concerning for pulmonary abscess.

## 2013-08-07 IMAGING — CR DG CHEST 2V
2 series · 2 of 2 positions shown · non-contrast
Comparison: [DATE]

CLINICAL DATA: History of pneumonia

EXAM:
CHEST  2 VIEW

[view not recorded (1 of 2)]
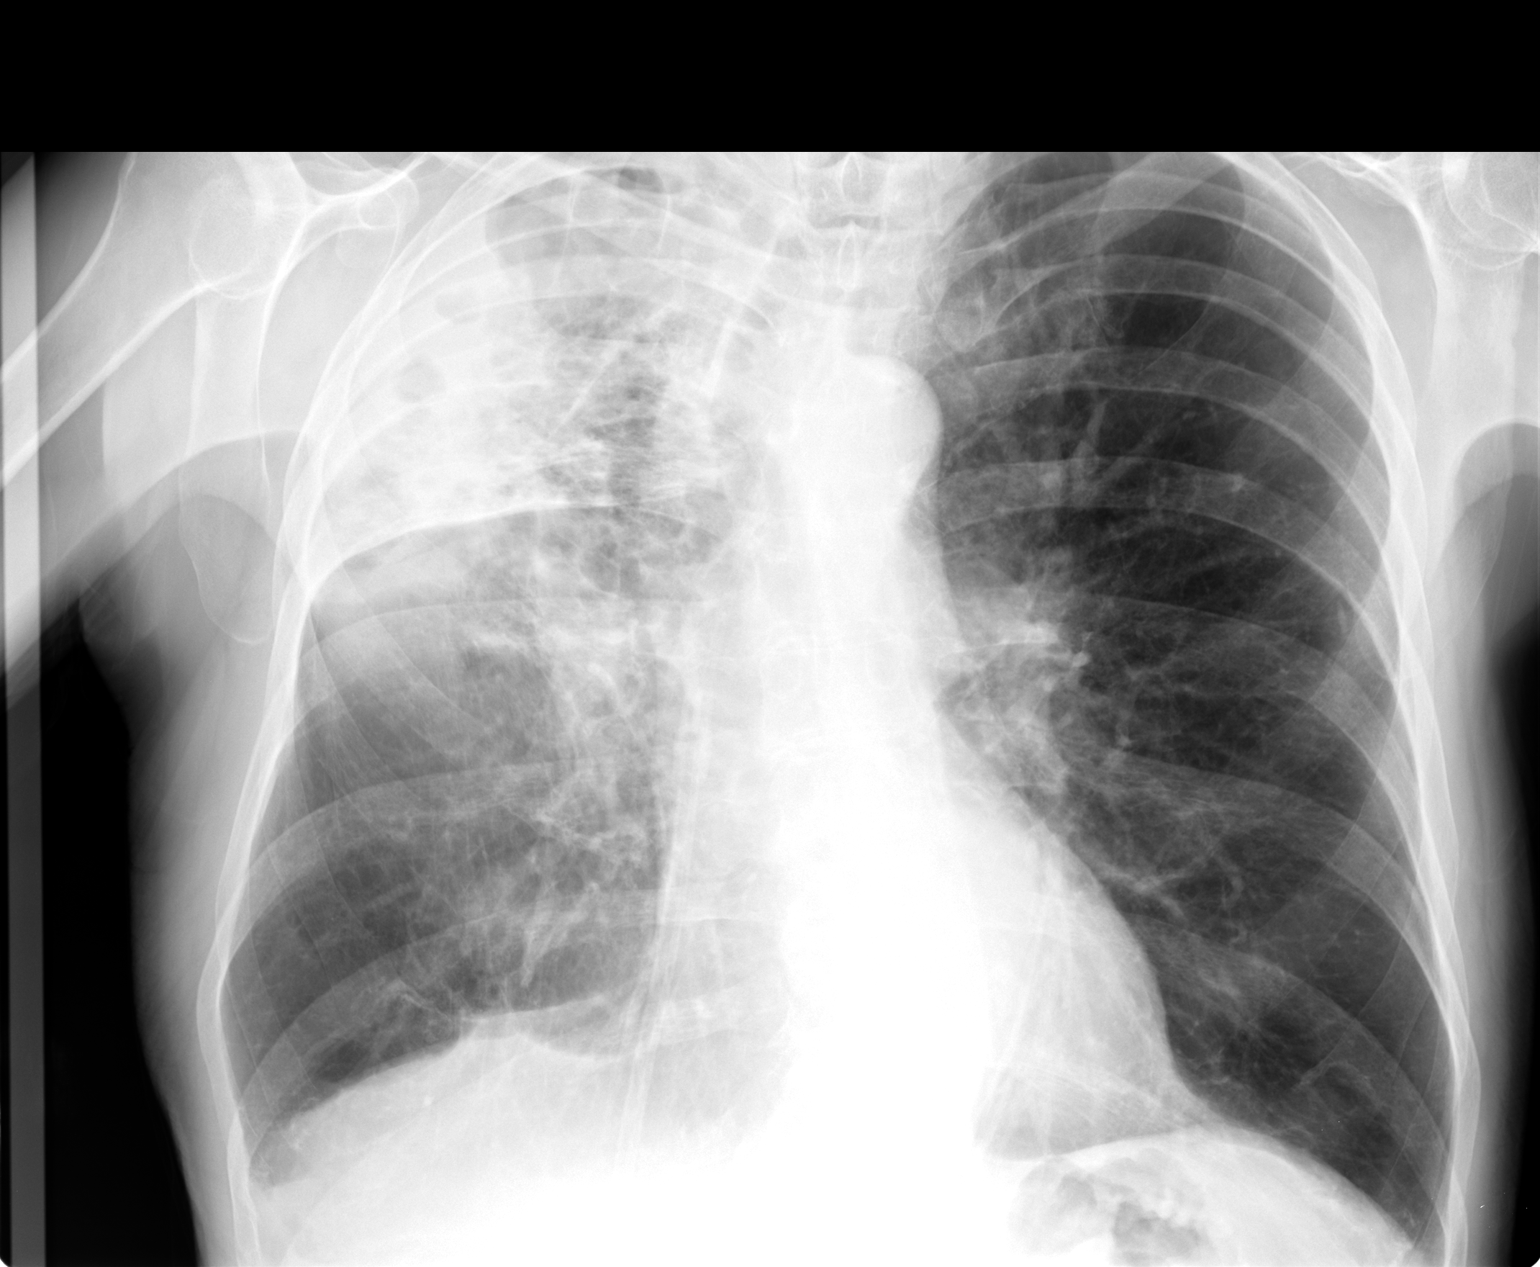

[view not recorded (2 of 2)]
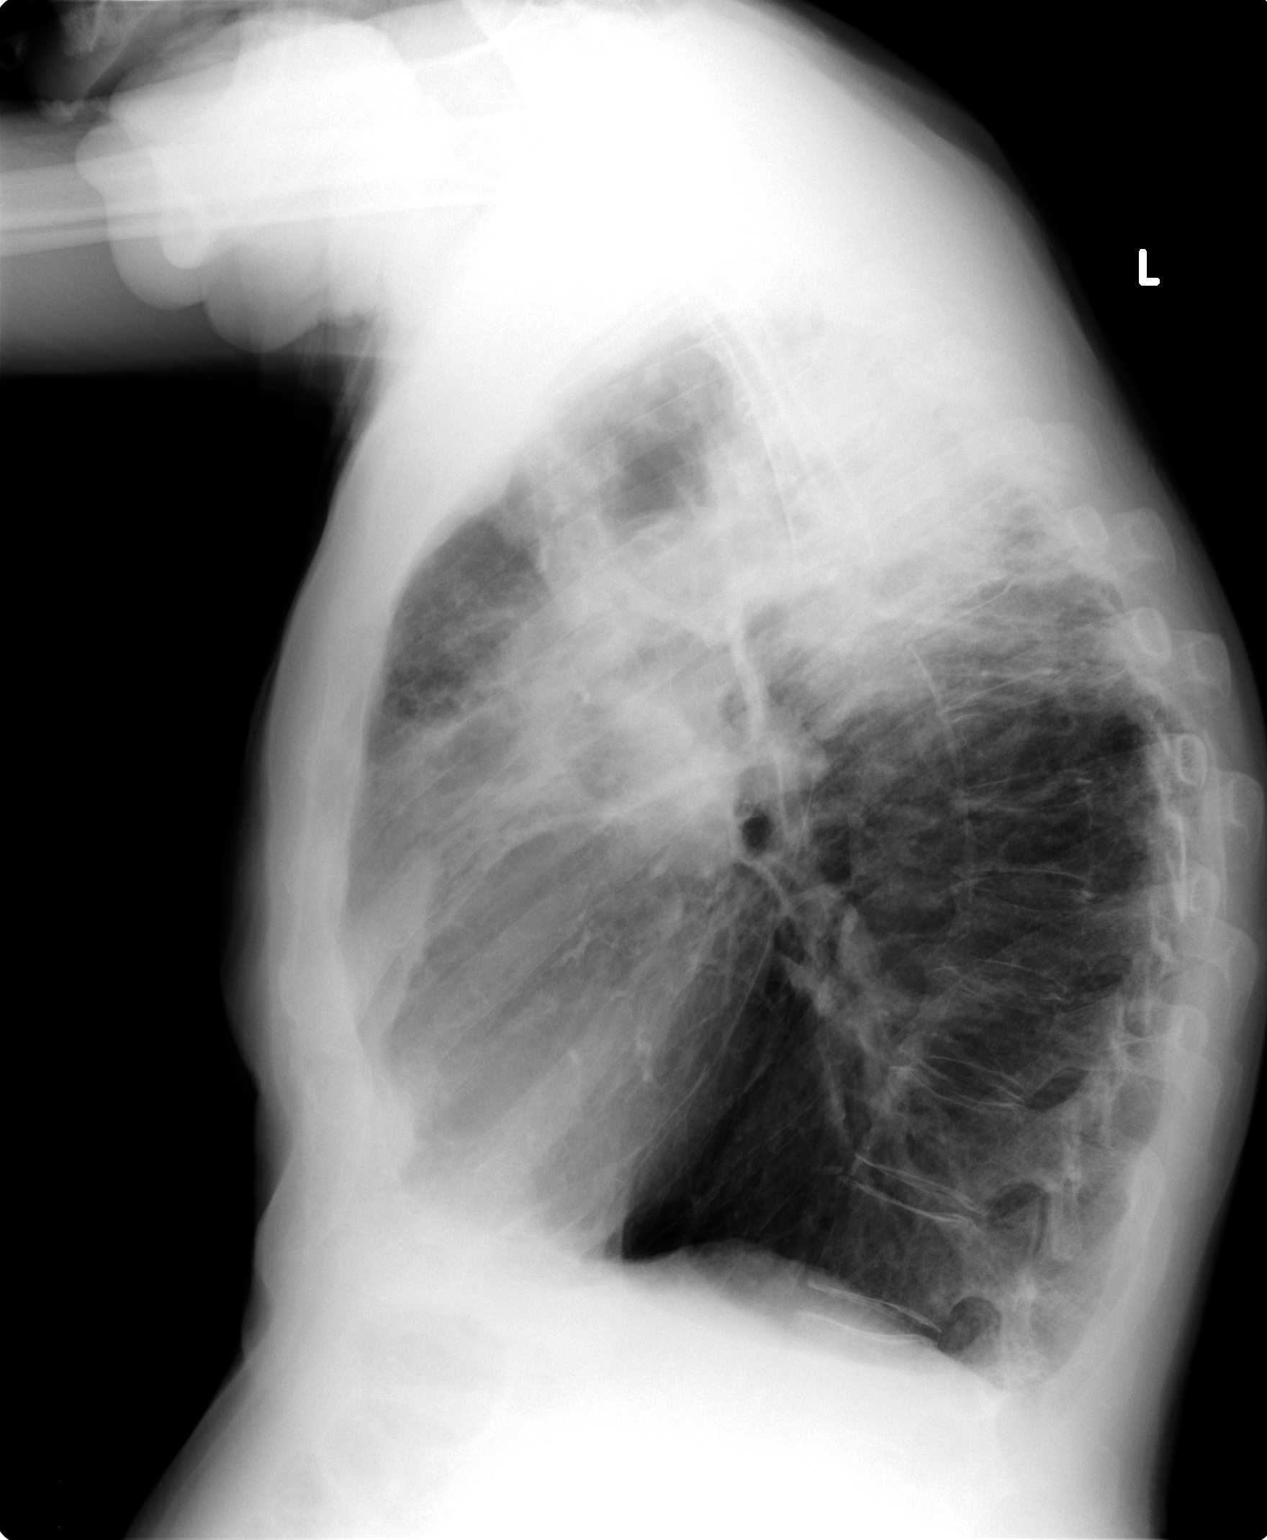

[2 of 2 positions shown; findings below may reference images not displayed]

FINDINGS: The cardiac silhouette is within normal limits. The patient is
right-sided central venous catheter has been removed in the interim.
Right-sided volume loss is once again appreciated. There is
increased conspicuity of the partially consolidated density in the
right upper lobe. Bullous changes are again appreciated with areas
of air-fluid levels. No new focal regions of consolidation
identified. The left hemi thorax is unremarkable. Visualized bony
skeleton is unremarkable.
IMPRESSION: Worsening right upper lobe pneumonitis as described above.
Emphysematous changes are also appreciated with air-fluid levels.

## 2013-08-07 MED ORDER — CIPROFLOXACIN IN D5W 400 MG/200ML IV SOLN: 400.0000 mg | Freq: Two times a day (BID) | INTRAVENOUS | Status: DC

## 2013-08-07 MED ORDER — PIPERACILLIN-TAZOBACTAM 3.375 G IVPB: 3.3750 g | Freq: Three times a day (TID) | INTRAVENOUS | Status: DC

## 2013-08-07 MED ORDER — VANCOMYCIN HCL 1000 MG IV SOLR: 1000.0000 mg | Freq: Two times a day (BID) | INTRAVENOUS | Status: DC

## 2013-08-07 NOTE — Assessment & Plan Note (Addendum)
Worsening RUL necrotizing PNA  CXR with worsening consolidation .  Long discussion with family /pt along with Dr. Shelle Iron regarding prognosis and risks/ benefits of further testing. Pt at high risk for FOB or consideration of surgical intervention.  He was sent for a stat CT chest that showed worsening necrotic right upper lobe pneumonia. There are now cavitary areas within the consolidative process concerning for pulmonary abscess. Pt will be started on triple combination coverage with Zosyn/Vanc/Cipro  At home via PICC- for prolonged therapy.  PICC will be set up OP .  Pt will have follow up in 1 week and As needed   On return will consider referral to ID clinic  Please contact office for sooner follow up if symptoms do not improve or worsen or seek emergency care  Advised if worsening may need hospitalization although he has been unrespsonsive to aggressive therapy with his underlying severe lung disease.   Plan  Set up for PICC  Begin Cipro John Black [name] IV  Stop Augmentin /Cipro oral once PICC placed. Start Probiotic Align daily

## 2013-08-07 NOTE — Patient Instructions (Addendum)
We are setting you up for a CT chest today.  Legs elevated . Low salt diet.  Continue on Augmentin and Cipro .  We will call with results of CT scan.  We will call with next step.  Follow up Dr. Shelle Iron in 2 weeks and As needed   Please contact office for sooner follow up if symptoms do not improve or worsen or seek emergency care    Late ADD  Set up for PICC  Begin IV Zosyn/Vanc/Cipro.  Multimedia programmer daily  Pt/wife/daughter aware

## 2013-08-07 NOTE — Progress Notes (Signed)
Pt evaluated with NP and agree with plan as outlined.

## 2013-08-07 NOTE — Progress Notes (Signed)
Subjective:    Patient ID: John Black, male    DOB: 1929/01/23, 79 y.o.   [name]: [id number]  HPI  79 yo former smoker with known hs COPD with chronic respiratory failure.    07/24/13  Post Hospital follow up  Pt returns for follow up from hospital admit  Admitted 10/20 for HCAP-probable necrotizing PNA .  Treated with aggressive pulmonary hygiene, IV abx, steroids. Preliminary AFB was neg. Legionella/strep pnuemon neg.  He was treated with aggressive abx and discharged w/ PICC line with Vancomycin x 2 weeks.   Since discharge he is not feeling much better.  Is wearing his O2 on regular basis.  Has persistent low grade fever ~100.  Seen by PCP 07/17/13  , BC repeated x 1 w/ no growth.   WBC is trending down from 18k >16.9K.  CXR today shows worsening consolidation on Riight.  >>Admitted   08/07/2013 Post Hospital follow up  Pt returns for post hospital follow up  Readmitted 11/7-11/10 for Necrotizing RUL PNA  Pt was started on 77 Zosyn during admission with aggressive pulmonary hygiene. He was discharged on  IV zosyn at home thru picc for another 7 days. Will then transition over to augmentin 875 bid and cipro 750 bid.  Returns today reporting his low grade fevers returned few days ago , before finishing Zosyn.  Breathing has not changed much . Still very weak.  Today CXR shows worsening consolidation and aeration in RUL .  Denies any hemoptysis, orthopnea, PND, abdominal pain, nausea, vomiting, diarrhea. Patient has noticed some puffiness along his ankles. PICC was removed a few days ago.  Appetite is good.     Review of Systems  .Constitutional:   No  weight loss, night sweats,   +Fevers, chills, +fatigue, or  lassitude.  HEENT:   No headaches,  Difficulty swallowing,  Tooth/dental problems, or  Sore throat,                No sneezing, itching, ear ache, nasal congestion, post nasal drip,   CV:  No chest pain,  Orthopnea, PND,  +swelling in lower extremities,     GI  No heartburn, indigestion, abdominal pain, nausea, vomiting, diarrhea, change in bowel habits, loss of appetite, bloody stools.   Resp:    No chest wall deformity  Skin: no rash or lesions.  GU: no dysuria, change in color of urine, no urgency or frequency.  No flank pain, no hematuria   MS:  No joint pain or swelling.  No decreased range of motion.  No back pain.  Psych:  No change in mood or affect. No depression or anxiety.  No memory loss.         Objective:   Physical Exam  GEN: A/Ox3; pleasant , NAD . Frail elderly   HEENT:  [place]/AT,  EACs-clear, TMs-wnl, NOSE-clear, THROAT-clear, no lesions, no postnasal drip or exudate noted.   NECK:  Supple w/ fair ROM; no JVD; normal carotid impulses w/o bruits; no thyromegaly or nodules palpated; no lymphadenopathy.  RESP  Crackles on right noted. no accessory muscle use, no dullness to percussion  CARD:  RRR-ST , no m/r/g  ,tr-1+   peripheral edema, pulses intact, no cyanosis or clubbing.   GI:   Soft & nt; nml bowel sounds; no organomegaly or masses detected.  Musco: Warm bil, no deformities or joint swelling noted.   Neuro: alert, no focal deficits noted.    Skin: Warm, no lesions or rashes  CXR 08/07/2013 Worsening of  pneumonia in the emphysematous right upper lobe.   CT chest 08/07/2013 Increase in extent and severity of necrotic right upper lobe pneumonia. There are now cavitary areas within the consolidative process concerning for pulmonary abscess.        Assessment & Plan:

## 2013-08-08 ENCOUNTER — Other Ambulatory Visit (HOSPITAL_COMMUNITY): Payer: Self-pay | Admitting: Diagnostic Radiology

## 2013-08-08 ENCOUNTER — Ambulatory Visit (HOSPITAL_COMMUNITY)
Admission: RE | Admit: 2013-08-08 | Discharge: 2013-08-08 | Disposition: A | Discharge status: Home / Self Care | Payer: Medicare Other | Source: Ambulatory Visit | Attending: Diagnostic Radiology | Admitting: Diagnostic Radiology

## 2013-08-08 DIAGNOSIS — J850 Gangrene and necrosis of lung: Secondary | ICD-10-CM

## 2013-08-08 DIAGNOSIS — Z87891 Personal history of nicotine dependence: Secondary | ICD-10-CM

## 2013-08-08 DIAGNOSIS — R062 Wheezing: Secondary | ICD-10-CM | POA: Diagnosis present

## 2013-08-08 DIAGNOSIS — I248 Other forms of acute ischemic heart disease: Secondary | ICD-10-CM | POA: Diagnosis present

## 2013-08-08 DIAGNOSIS — J852 Abscess of lung without pneumonia: Principal | ICD-10-CM | POA: Diagnosis present

## 2013-08-08 DIAGNOSIS — R000 Tachycardia, unspecified: Secondary | ICD-10-CM | POA: Diagnosis present

## 2013-08-08 DIAGNOSIS — J9620 Acute and chronic respiratory failure, unspecified whether with hypoxia or hypercapnia: Secondary | ICD-10-CM | POA: Diagnosis present

## 2013-08-08 DIAGNOSIS — E871 Hypo-osmolality and hyponatremia: Secondary | ICD-10-CM | POA: Diagnosis present

## 2013-08-08 DIAGNOSIS — J189 Pneumonia, unspecified organism: Secondary | ICD-10-CM | POA: Diagnosis present

## 2013-08-08 DIAGNOSIS — J438 Other emphysema: Secondary | ICD-10-CM | POA: Diagnosis present

## 2013-08-08 DIAGNOSIS — R609 Edema, unspecified: Secondary | ICD-10-CM | POA: Diagnosis present

## 2013-08-08 DIAGNOSIS — Z9981 Dependence on supplemental oxygen: Secondary | ICD-10-CM

## 2013-08-08 DIAGNOSIS — C61 Malignant neoplasm of prostate: Secondary | ICD-10-CM | POA: Diagnosis present

## 2013-08-08 DIAGNOSIS — I2489 Other forms of acute ischemic heart disease: Secondary | ICD-10-CM | POA: Diagnosis present

## 2013-08-08 DIAGNOSIS — R030 Elevated blood-pressure reading, without diagnosis of hypertension: Secondary | ICD-10-CM | POA: Diagnosis present

## 2013-08-08 DIAGNOSIS — Z8042 Family history of malignant neoplasm of prostate: Secondary | ICD-10-CM

## 2013-08-08 DIAGNOSIS — Z8249 Family history of ischemic heart disease and other diseases of the circulatory system: Secondary | ICD-10-CM

## 2013-08-08 DIAGNOSIS — E039 Hypothyroidism, unspecified: Secondary | ICD-10-CM | POA: Diagnosis present

## 2013-08-08 DIAGNOSIS — Z66 Do not resuscitate: Secondary | ICD-10-CM | POA: Diagnosis present

## 2013-08-08 IMAGING — XA IR FLUORO GUIDE CV LINE*R*
1 series · 2 of 2 positions shown · non-contrast
Comparison: none

CLINICAL DATA: COPD, pneumonia and need for long-term IV antibiotic
therapy.

[Series 1: run · 2 of 2 slices shown]
[im 1/2]
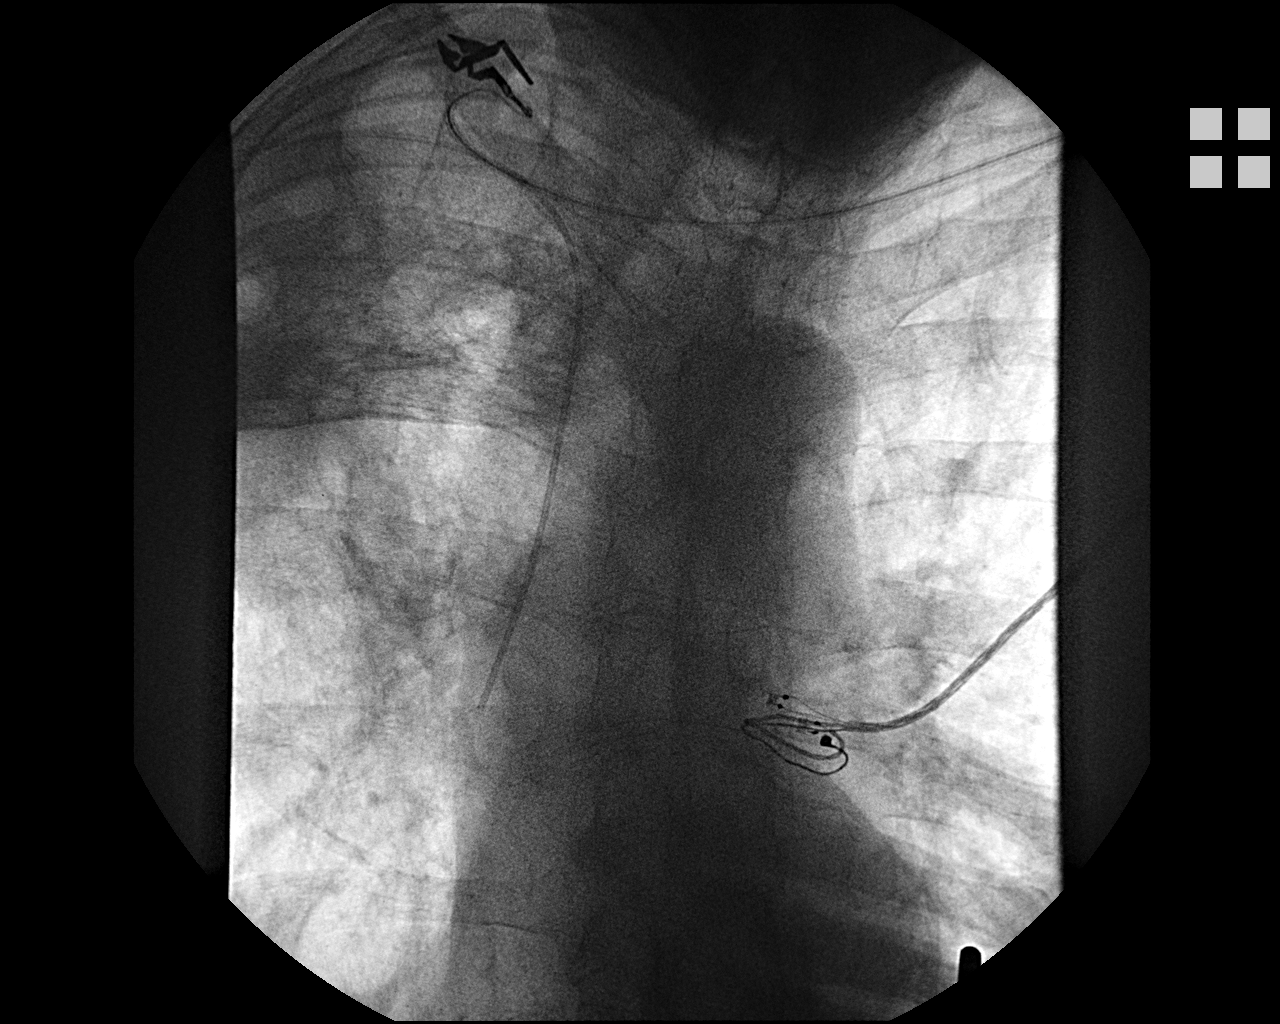
[im 2/2]
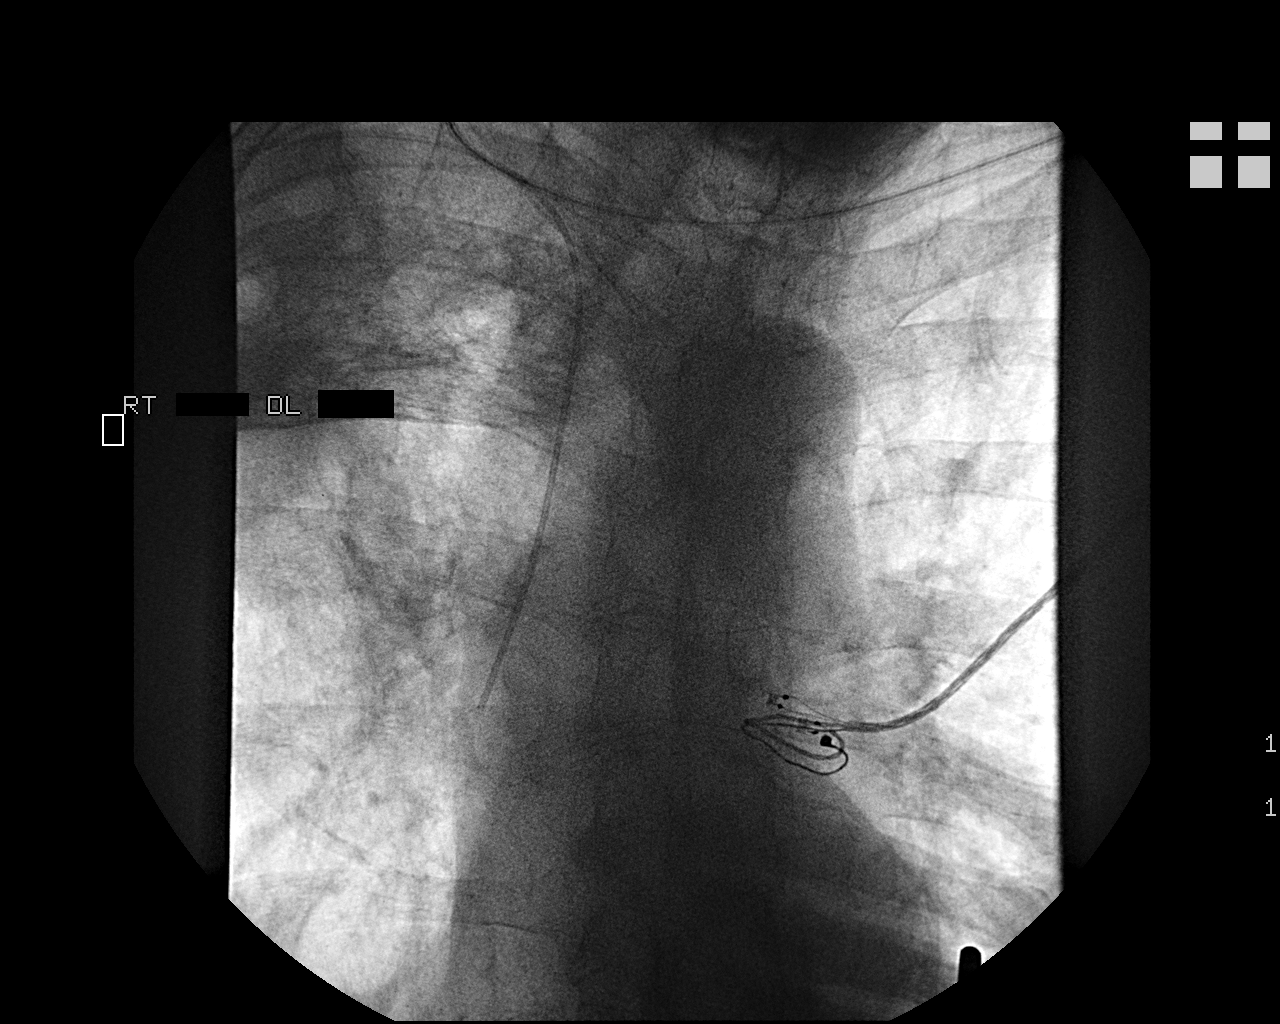

[2 of 2 positions shown; findings below may reference images not displayed]

EXAM:
POWER PICC LINE PLACEMENT WITH ULTRASOUND AND FLUOROSCOPIC GUIDANCE

FLUOROSCOPY TIME:  24 seconds.

PROCEDURE:
The patient was advised of the possible risks and complications and
agreed to undergo the procedure. The patient was then brought to the
angiographic suite for the procedure.

The right arm was prepped with chlorhexidine, draped in the usual
sterile fashion using maximum barrier technique (cap and mask,
sterile gown, sterile gloves, large sterile sheet, hand hygiene and
cutaneous antisepsis) and infiltrated locally with 1% Lidocaine.

Ultrasound demonstrated patency of the right brachial vein, and this
was documented with an image. Under real-time ultrasound guidance,
this vein was accessed with a 21 gauge micropuncture needle and
image documentation was performed. A [DATE] wire was introduced in to
the vein. Over this, a 37 cm, 5 French single lumen power injectable
PICC was advanced to the lower SVC/right atrial junction.
Fluoroscopy during the procedure and fluoro spot radiograph confirms
appropriate catheter position. The catheter was flushed and covered
with a sterile dressing.

Complications: None
IMPRESSION: Successful right arm power injectable PICC line placement with
ultrasound and fluoroscopic guidance. The catheter is ready for use.

## 2013-08-08 NOTE — Procedures (Signed)
Procedure:  Right arm PICC line Access:  Right brachial vein.  37 cm DL Power PICC with tip at cavoatrial junction.  OK to use.

## 2013-08-09 ENCOUNTER — Encounter (HOSPITAL_COMMUNITY): Payer: Self-pay | Admitting: Emergency Medicine

## 2013-08-09 ENCOUNTER — Emergency Department (HOSPITAL_COMMUNITY): Payer: Medicare Other

## 2013-08-09 ENCOUNTER — Inpatient Hospital Stay (HOSPITAL_COMMUNITY)
Admission: EM | Admit: 2013-08-09 | Discharge: 2013-08-17 | DRG: 177 | Disposition: A | Discharge status: Home Health | Payer: Medicare Other | Attending: Internal Medicine | Admitting: Internal Medicine

## 2013-08-09 DIAGNOSIS — J189 Pneumonia, unspecified organism: Secondary | ICD-10-CM | POA: Diagnosis present

## 2013-08-09 DIAGNOSIS — E039 Hypothyroidism, unspecified: Secondary | ICD-10-CM

## 2013-08-09 DIAGNOSIS — R918 Other nonspecific abnormal finding of lung field: Secondary | ICD-10-CM

## 2013-08-09 DIAGNOSIS — J449 Chronic obstructive pulmonary disease, unspecified: Secondary | ICD-10-CM | POA: Diagnosis present

## 2013-08-09 DIAGNOSIS — D72829 Elevated white blood cell count, unspecified: Secondary | ICD-10-CM

## 2013-08-09 DIAGNOSIS — J850 Gangrene and necrosis of lung: Secondary | ICD-10-CM

## 2013-08-09 DIAGNOSIS — Z8546 Personal history of malignant neoplasm of prostate: Secondary | ICD-10-CM | POA: Diagnosis present

## 2013-08-09 DIAGNOSIS — J439 Emphysema, unspecified: Secondary | ICD-10-CM

## 2013-08-09 DIAGNOSIS — J9620 Acute and chronic respiratory failure, unspecified whether with hypoxia or hypercapnia: Secondary | ICD-10-CM

## 2013-08-09 DIAGNOSIS — E871 Hypo-osmolality and hyponatremia: Secondary | ICD-10-CM | POA: Diagnosis present

## 2013-08-09 DIAGNOSIS — R600 Localized edema: Secondary | ICD-10-CM

## 2013-08-09 LAB — CBC WITH DIFFERENTIAL/PLATELET
Basophils Absolute: 0 10*3/uL (ref 0.0–0.1)
Basophils Relative: 0 % (ref 0–1)
Eosinophils Absolute: 0.3 10*3/uL (ref 0.0–0.7)
Eosinophils Relative: 3 % (ref 0–5)
HCT: 31.7 % — ABNORMAL LOW (ref 39.0–52.0)
Hemoglobin: 10.6 g/dL — ABNORMAL LOW (ref 13.0–17.0)
Lymphocytes Relative: 12 % (ref 12–46)
Lymphs Abs: 1.2 10*3/uL (ref 0.7–4.0)
MCH: 27.5 pg (ref 26.0–34.0)
MCHC: 33.4 g/dL (ref 30.0–36.0)
MCV: 82.3 fL (ref 78.0–100.0)
Monocytes Absolute: 1.2 10*3/uL — ABNORMAL HIGH (ref 0.1–1.0)
Monocytes Relative: 12 % (ref 3–12)
Neutro Abs: 7.3 10*3/uL (ref 1.7–7.7)
Neutrophils Relative %: 73 % (ref 43–77)
Platelets: 431 10*3/uL — ABNORMAL HIGH (ref 150–400)
RBC: 3.85 MIL/uL — ABNORMAL LOW (ref 4.22–5.81)
RDW: 14.1 % (ref 11.5–15.5)
WBC: 10 10*3/uL (ref 4.0–10.5)

## 2013-08-09 LAB — URINE MICROSCOPIC-ADD ON

## 2013-08-09 LAB — COMPREHENSIVE METABOLIC PANEL
ALT: 25 U/L (ref 0–53)
AST: 23 U/L (ref 0–37)
Albumin: 2.5 g/dL — ABNORMAL LOW (ref 3.5–5.2)
Alkaline Phosphatase: 69 U/L (ref 39–117)
BUN: 20 mg/dL (ref 6–23)
CO2: 27 mEq/L (ref 19–32)
Calcium: 8.9 mg/dL (ref 8.4–10.5)
Chloride: 99 mEq/L (ref 96–112)
Creatinine, Ser: 1.02 mg/dL (ref 0.50–1.35)
GFR calc Af Amer: 77 mL/min — ABNORMAL LOW (ref >90)
GFR calc non Af Amer: 66 mL/min — ABNORMAL LOW (ref >90)
Glucose, Bld: 114 mg/dL — ABNORMAL HIGH (ref 70–99)
Potassium: 4.1 mEq/L (ref 3.5–5.1)
Sodium: 134 mEq/L — ABNORMAL LOW (ref 135–145)
Total Bilirubin: 0.2 mg/dL — ABNORMAL LOW (ref 0.3–1.2)
Total Protein: 6.4 g/dL (ref 6.0–8.3)

## 2013-08-09 LAB — URINALYSIS, ROUTINE W REFLEX MICROSCOPIC
Bilirubin Urine: NEGATIVE
Glucose, UA: NEGATIVE mg/dL
Hgb urine dipstick: NEGATIVE
Ketones, ur: NEGATIVE mg/dL
Leukocytes, UA: NEGATIVE
Nitrite: NEGATIVE
Protein, ur: 30 mg/dL — AB
Specific Gravity, Urine: 1.032 — ABNORMAL HIGH (ref 1.005–1.030)
Urobilinogen, UA: 0.2 mg/dL (ref 0.0–1.0)
pH: 5.5 (ref 5.0–8.0)

## 2013-08-09 LAB — CG4 I-STAT (LACTIC ACID): Lactic Acid, Venous: 0.7 mmol/L (ref 0.5–2.2)

## 2013-08-09 IMAGING — CR DG CHEST 1V PORT
1 series · 1 of 1 positions shown · non-contrast
Comparison: [DATE]

CLINICAL DATA: Pneumonia for 5 weeks with increasing shortness of
breath and continued fever

EXAM:
PORTABLE CHEST - 1 VIEW

[AP]
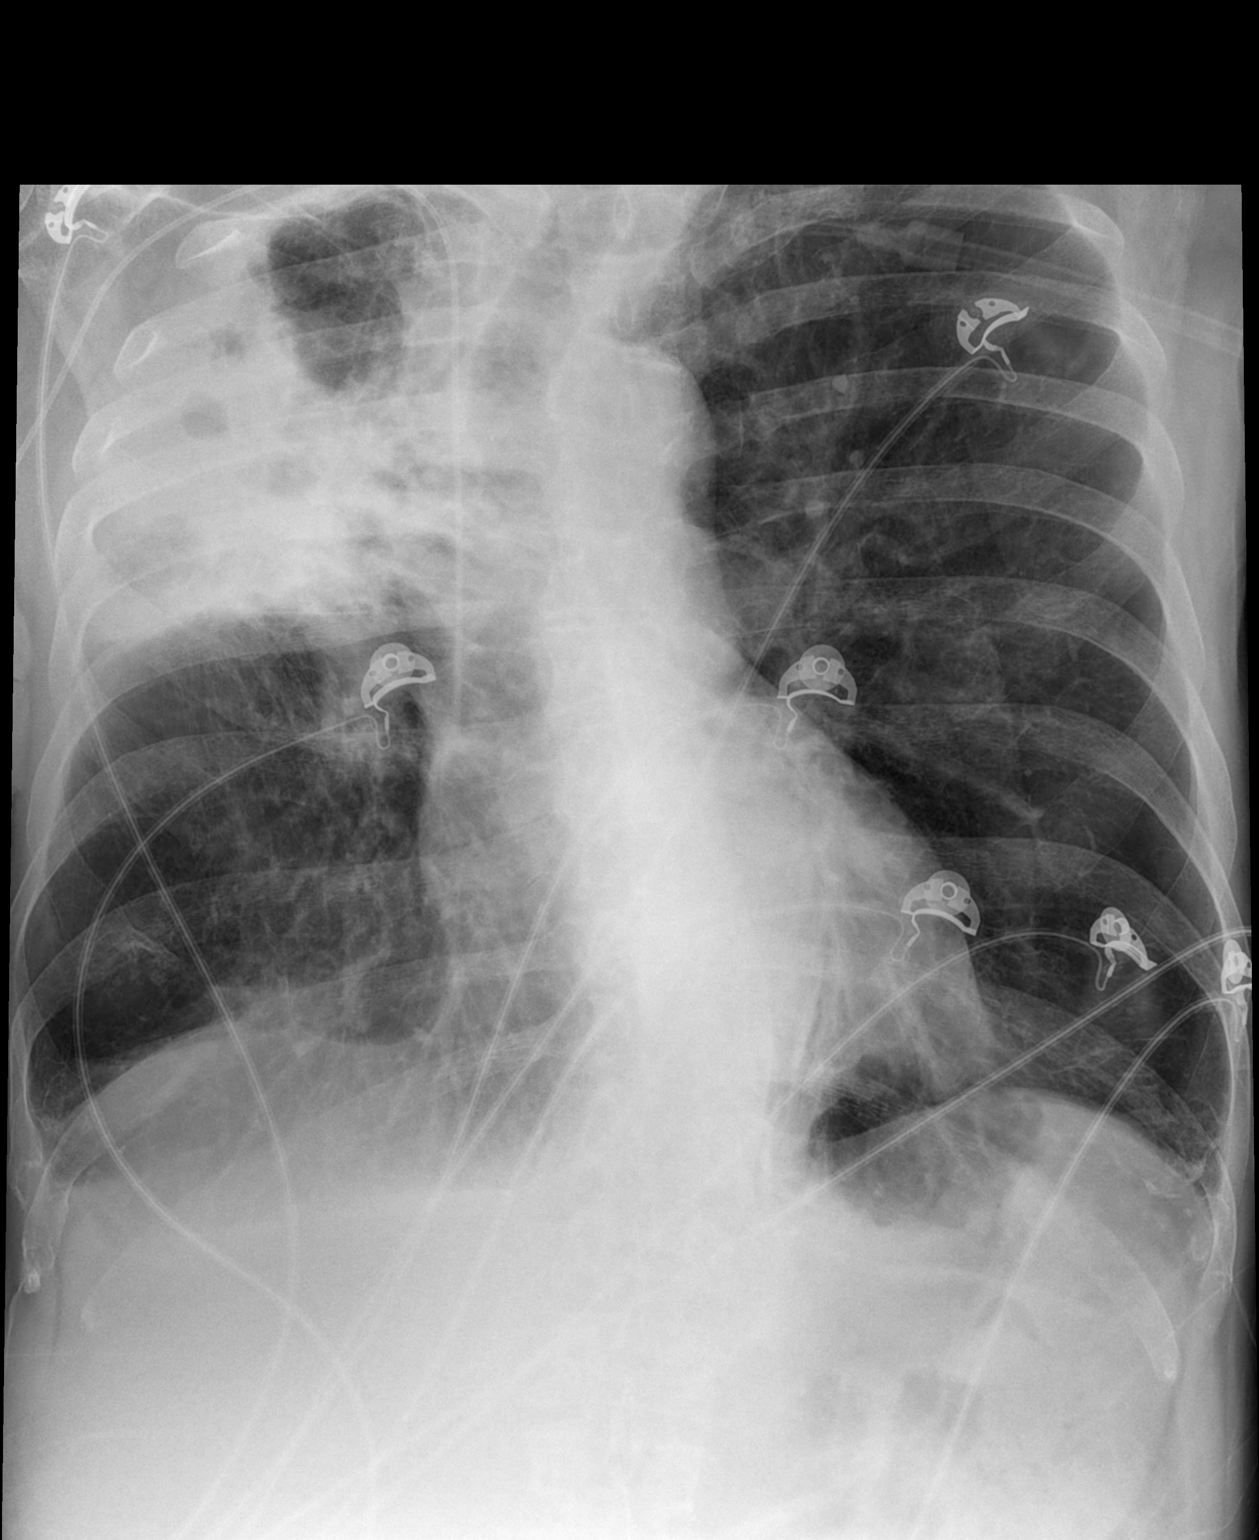

[1 of 1 positions shown; findings below may reference images not displayed]

FINDINGS: Unchanged right upper lobe consolidation with areas of lucency
within it suggesting necrosis. Right PICC line identified with tip
to the cavoatrial junction. Tiny right effusion. Left lung is clear.
IMPRESSION: No significant change from prior study. Right upper lobe necrotic
pneumonia again identified.

## 2013-08-09 MED ORDER — SODIUM CHLORIDE 0.9 % IV SOLN
1000.0000 mL | Freq: Once | INTRAVENOUS | Status: AC
  Administered 2013-08-09: 1000 mL via INTRAVENOUS

## 2013-08-09 MED ORDER — SODIUM CHLORIDE 0.9 % IV SOLN: 1000.0000 mL | INTRAVENOUS | Status: DC

## 2013-08-09 MED ORDER — CIPROFLOXACIN IN D5W 400 MG/200ML IV SOLN
400.0000 mg | Freq: Once | INTRAVENOUS | Status: AC
  Administered 2013-08-09: 400 mg via INTRAVENOUS
  Filled 2013-08-09 (×2): qty 200

## 2013-08-09 MED ORDER — VANCOMYCIN HCL IN DEXTROSE 1-5 GM/200ML-% IV SOLN
1000.0000 mg | Freq: Once | INTRAVENOUS | Status: AC
  Administered 2013-08-09: 1000 mg via INTRAVENOUS
  Filled 2013-08-09: qty 200

## 2013-08-09 MED ORDER — SODIUM CHLORIDE 0.9 % IV SOLN: 1000.0000 mL | Freq: Once | INTRAVENOUS | Status: DC

## 2013-08-09 MED ORDER — PIPERACILLIN-TAZOBACTAM 3.375 G IVPB 30 MIN
3.3750 g | Freq: Once | INTRAVENOUS | Status: AC
  Administered 2013-08-09: 3.375 g via INTRAVENOUS
  Filled 2013-08-09: qty 50

## 2013-08-09 NOTE — ED Notes (Addendum)
Pt states he has been running a low grade fever for 1 week, +chills.  Pt is on IV antibiotics at home via PICC line for PNA.  Tonight wife states pt's O2 sats began dropping and home health RN was reached who suggested pt come to the ED as she believed he may be having a reaction to the antibiotics.  Pt's lung sounds are diminished, RR is labored, pt is sitting on the side of the bed to facilitate easier breathing.

## 2013-08-09 NOTE — H&P (Signed)
[facility] History and Physical  Vega Withrow ONG:[id number] DOB: 1929-05-04 DOA: 08/09/2013  Referring physician: ER physician. PCP: Milinda Antis, MD  Specialists: Dr. Shelle Iron. Pulmonologist.  Chief Complaint: Shortness of breath.  HPI: John Black is a 79 y.o. male who was recently diagnosed with necrotizing pneumonia has had a CAT scan of his chest yesterday and was found to have worsening of his pneumonia with cavitations. Patient was started on vancomycin Zosyn and Cipro as outpatient after PICC line was placed by patient's pulmonologist. Despite which patient's breathing got worse and was advised to come to the ER. Patient usually uses 4 L of oxygen and presently needs to use 6 L to maintain saturation more than 90% and patient is also using accessory muscles to breathe. Chest x-ray does not show anything worsening. Patient in addition has some chest tightness. Patient's daughter states that earlier today patient had fever with temperatures around 101F. Patient otherwise denies any nausea vomiting abdominal pain diarrhea.   Review of Systems: As presented in the history of presenting illness, rest negative.  Past Medical History  Diagnosis Date  . Emphysema   . Allergic rhinitis   . Prostate cancer   . Hypothyroid   . Cataract     s/p removal  . PNA (pneumonia)   . Chronic respiratory failure     oxygen 3L at home   Past Surgical History  Procedure Laterality Date  . Rotator cuff repair  [id number]    bilateral  . Transurethral resection of prostate  2001    x2  . Tonsillectomy    . Adenoidectomy    . Seed implant for prostate cancer  2000   Social History:  reports that he quit smoking about 4 years ago. His smoking use included Cigarettes. He has a 75 pack-year smoking history. He does not have any smokeless tobacco history on file. He reports that he drinks about 0.6 ounces of alcohol per week. He reports that he does not use illicit drugs. Where  does patient live home. Can patient participate in ADLs? Yes.  Allergies  Allergen Reactions  . Morphine     REACTION: sweats    Family History:  Family History  Problem Relation Age of Onset  . Heart disease Mother   . Prostate cancer Father       Prior to Admission medications   Medication Sig Start Date End Date Taking? Authorizing Provider  acetaminophen (TYLENOL) 500 MG tablet Take by mouth every 6 (six) hours as needed for mild pain or fever.    Yes Historical Provider, MD  albuterol (PROVENTIL) (5 MG/ML) 0.5% nebulizer solution Take 0.5 mLs (2.5 mg total) by nebulization every 6 (six) hours. 07/27/13  Yes Manfred S Minor, NP  budesonide (PULMICORT) 0.25 MG/2ML nebulizer solution Take 2 mLs (0.25 mg total) by nebulization every 6 (six) hours. 07/27/13  Yes Vilinda Blanks Minor, NP  Calcium-Magnesium-Vitamin D (CITRACAL CALCIUM+D PO) Take 1 tablet by mouth every morning.    Yes Historical Provider, MD  ciprofloxacin (CIPRO IN D5W) 400 MG/200ML SOLN Inject 200 mLs (400 mg total) into the vein every 12 (twelve) hours. X 30 days 08/07/13  Yes Tammy S Parrett, NP  doxazosin (CARDURA) 8 MG tablet Take 8 mg by mouth at bedtime. 06/02/13  Yes Salley Scarlet, MD  ibuprofen (ADVIL,MOTRIN) 400 MG tablet Take 400 mg by mouth every 6 (six) hours as needed for fever, headache or mild pain.    Yes Historical Provider, MD  ipratropium (ATROVENT) 0.02 % nebulizer  solution Take 2.5 mLs (0.5 mg total) by nebulization every 6 (six) hours. 07/27/13  Yes Arnol S Minor, NP  Ipratropium-Albuterol (COMBIVENT) 20-100 MCG/ACT AERS respimat Inhale 1 puff into the lungs every 6 (six) hours as needed for wheezing or shortness of breath. 06/26/13  Yes Salley Scarlet, MD  levothyroxine (SYNTHROID, LEVOTHROID) 75 MCG tablet Take 1 tablet (75 mcg total) by mouth daily. 04/16/13  Yes Salley Scarlet, MD  Multiple Vitamin (MULTIVITAMIN WITH MINERALS) TABS tablet Take 1 tablet by mouth every morning.   Yes Historical  Provider, MD  oxyCODONE-acetaminophen (PERCOCET/ROXICET) 5-325 MG per tablet Take 1 tablet by mouth at bedtime.   Yes Historical Provider, MD  piperacillin-tazobactam (ZOSYN) 3.375 GM/50ML IVPB Inject 50 mLs (3.375 g total) into the vein every 8 (eight) hours. X 30 days 08/07/13  Yes Tammy S Parrett, NP  sodium chloride 0.9 % SOLN 250 mL with vancomycin 1000 MG SOLR 1,000 mg Inject 1,000 mg into the vein every 12 (twelve) hours. X 30 days 08/07/13  Yes Tammy S Parrett, NP  zolpidem (AMBIEN) 10 MG tablet Take 10 mg by mouth at bedtime.  07/22/13  Yes Historical Provider, MD    Physical Exam: Filed Vitals:   08/09/13 2010 08/09/13 2017 08/09/13 2020 08/09/13 2336  BP:  114/59  133/64  Pulse:  130  118  Temp:  98.9 F (37.2 C)  98.8 F (37.1 C)  TempSrc:  Oral  Oral  Resp:  32  26  Height:  5\' 6"  (1.676 m)    Weight:  58.968 kg (130 lb)    SpO2: 93% 83% 92% 90%     General:  Well-developed well-nourished.  Eyes: Anicteric no pallor.  ENT: No discharge from ears eyes nose mouth.  Neck: No mass felt.  Cardiovascular: S1-S2 heard.  Respiratory: Mild expiratory wheeze no crepitations.  Abdomen: Soft nontender bowel sounds present.  Skin: No rash.  Musculoskeletal: No edema.  Psychiatric: Appears normal.  Neurologic: Alert awake oriented to time place and person. Moves all extremities.  Labs on Admission:  Basic Metabolic Panel:  Recent Labs Lab 08/09/13 2125  NA 134*  K 4.1  CL 99  CO2 27  GLUCOSE 114*  BUN 20  CREATININE 1.02  CALCIUM 8.9   Liver Function Tests:  Recent Labs Lab 08/09/13 2125  AST 23  ALT 25  ALKPHOS 69  BILITOT 0.2*  PROT 6.4  ALBUMIN 2.5*   No results found for this basename: LIPASE, AMYLASE,  in the last 168 hours No results found for this basename: AMMONIA,  in the last 168 hours CBC:  Recent Labs Lab 08/09/13 2125  WBC 10.0  NEUTROABS 7.3  HGB 10.6*  HCT 31.7*  MCV 82.3  PLT 431*   Cardiac Enzymes: No results found  for this basename: CKTOTAL, CKMB, CKMBINDEX, TROPONINI,  in the last 168 hours  BNP (last 3 results)  Recent Labs  07/24/13 2004  PROBNP 335.2   CBG: No results found for this basename: GLUCAP,  in the last 168 hours  Radiological Exams on Admission: Ir Fluoro Guide Cv Line Right  08/08/2013   CLINICAL DATA:  COPD, pneumonia and need for long-term IV antibiotic therapy.  EXAM: POWER PICC LINE PLACEMENT WITH ULTRASOUND AND FLUOROSCOPIC GUIDANCE  FLUOROSCOPY TIME:  24 seconds.  PROCEDURE: The patient was advised of the possible risks and complications and agreed to undergo the procedure. The patient was then brought to the angiographic suite for the procedure.  The right arm was prepped  with chlorhexidine, draped in the usual sterile fashion using maximum barrier technique (cap and mask, sterile gown, sterile gloves, large sterile sheet, hand hygiene and cutaneous antisepsis) and infiltrated locally with 1% Lidocaine.  Ultrasound demonstrated patency of the right brachial vein, and this was documented with an image. Under real-time ultrasound guidance, this vein was accessed with a 21 gauge micropuncture needle and image documentation was performed. A 0.018 wire was introduced in to the vein. Over this, a 37 cm, 5 [place] single lumen power injectable PICC was advanced to the lower SVC/right atrial junction. Fluoroscopy during the procedure and fluoro spot radiograph confirms appropriate catheter position. The catheter was flushed and covered with a sterile dressing.  Complications: None  IMPRESSION: Successful right arm power injectable PICC line placement with ultrasound and fluoroscopic guidance. The catheter is ready for use.   Electronically Signed   By: Irish Lack M.D.   On: 08/08/2013 12:43   Ir US Guide Vasc Access Right  08/08/2013   CLINICAL DATA:  COPD, pneumonia and need for long-term IV antibiotic therapy.  EXAM: POWER PICC LINE PLACEMENT WITH ULTRASOUND AND FLUOROSCOPIC GUIDANCE   FLUOROSCOPY TIME:  24 seconds.  PROCEDURE: The patient was advised of the possible risks and complications and agreed to undergo the procedure. The patient was then brought to the angiographic suite for the procedure.  The right arm was prepped with chlorhexidine, draped in the usual sterile fashion using maximum barrier technique (cap and mask, sterile gown, sterile gloves, large sterile sheet, hand hygiene and cutaneous antisepsis) and infiltrated locally with 1% Lidocaine.  Ultrasound demonstrated patency of the right brachial vein, and this was documented with an image. Under real-time ultrasound guidance, this vein was accessed with a 21 gauge micropuncture needle and image documentation was performed. A 0.018 wire was introduced in to the vein. Over this, a 37 cm, 5 [place] single lumen power injectable PICC was advanced to the lower SVC/right atrial junction. Fluoroscopy during the procedure and fluoro spot radiograph confirms appropriate catheter position. The catheter was flushed and covered with a sterile dressing.  Complications: None  IMPRESSION: Successful right arm power injectable PICC line placement with ultrasound and fluoroscopic guidance. The catheter is ready for use.   Electronically Signed   By: Irish Lack M.D.   On: 08/08/2013 12:43   Dg Chest Port 1 View  08/09/2013   CLINICAL DATA:  Pneumonia for 5 weeks with increasing shortness of breath and continued fever  EXAM: PORTABLE CHEST - 1 VIEW  COMPARISON:  08/07/2013  FINDINGS: Unchanged right upper lobe consolidation with areas of lucency within it suggesting necrosis. Right PICC line identified with tip to the cavoatrial junction. Tiny right effusion. Left lung is clear.  IMPRESSION: No significant change from prior study. Right upper lobe necrotic pneumonia again identified.   Electronically Signed   By: Esperanza Heir M.D.   On: 08/09/2013 21:35    EKG: Independently reviewed. Sinus tachycardia with T wave inversions in lead  one. ST-T changes in the inferior leads are comparable to the old EKG.  Assessment/Plan Active Problems:   Hypothyroidism   Acute-on-chronic respiratory failure   HCAP (healthcare-associated pneumonia)/ Probable necrotizing PNA   1. Acute on chronic respiratory failure probably worsened because of patient's necrotizing pneumonia - continue the vancomycin Zosyn and Cipro. Check sputum cultures. I have consulted pulmonary critical care for further recommendations. 2. COPD - continue nebulizer and Pulmicort. 3. Hypothyroidism - check TSH.    Code Status: DO NOT RESUSCITATE.  Family  Communication: Patient's family at the bedside.  Disposition Plan: Admit to inpatient.    Gloriana Piltz N. [facility] Pager [phone].  If 7PM-7AM, please contact night-coverage [email or web address] Password [facility] 08/09/2013, 11:39 PM

## 2013-08-09 NOTE — ED Notes (Signed)
Per EMS pt family called d/t low O2 sats at home.  Pt is on 4L O2 at home d/t COPD.  Pt received neb treatment en route.

## 2013-08-09 NOTE — ED Notes (Signed)
Bed: ZO10 Expected date: 08/09/13 Expected time: 8:00 PM Means of arrival: Ambulance Comments: Bed 21, EMS, 84 M, SOB

## 2013-08-09 NOTE — ED Provider Notes (Signed)
CSN: [id number]     Arrival date & time 08/09/13  2009 History   First MD Initiated Contact with Patient 08/09/13 2030     Chief Complaint  Patient presents with  . Shortness of Breath   (Consider location/radiation/quality/duration/timing/severity/associated sxs/prior Treatment) HPI This 79 year old male has a history of COPD, home oxygen dependence, recently admitted for HCAP, has a known worsening necrotizing pneumonia with possible abscess, was discharged initially on oral antibiotics which been switched to IV antibiotics, he is now taking home IV antibiotics was probed, vancomycin, Zosyn, for the last month he has had daily fevers between 100-101, he was told he could return for hospitalization if his symptoms worsen, today's symptoms worsened with worsening shortness of breath, at baseline he is on 4 L nasal cannula oxygen, today his required 6 L nasal cannula oxygen to maintain oxygen saturations of 90%, he had a breathing treatment within the last few hours does not feel as if he needs another albuterol nebulizer now, he is on albuterol inhalers as well as nebulizer he is no confusion rash itching vomiting, his last fever was today 100.6, his wife prefers that he be re-hospitalized and the patient agrees with the assessment and plan. The patient is generally weak for several weeks. Past Medical History  Diagnosis Date  . Emphysema   . Allergic rhinitis   . Prostate cancer   . Hypothyroid   . Cataract     s/p removal  . PNA (pneumonia)   . Chronic respiratory failure     oxygen 3L at home   Past Surgical History  Procedure Laterality Date  . Rotator cuff repair  [id number]    bilateral  . Transurethral resection of prostate  2001    x2  . Tonsillectomy    . Adenoidectomy    . Seed implant for prostate cancer  2000   Family History  Problem Relation Age of Onset  . Heart disease Mother   . Prostate cancer Father    History  Substance Use Topics  . Smoking status: Former  Smoker -- 1.50 packs/day for 50 years    Types: Cigarettes    Quit date: 09/17/2008  . Smokeless tobacco: Never Used  . Alcohol Use: 0.6 oz/week    1 Glasses of wine per week     Comment: each evening    Review of Systems 10 Systems reviewed and are negative for acute change except as noted in the HPI. Allergies  Morphine  Home Medications   No current outpatient prescriptions on file. BP 123/68  Pulse 102  Temp(Src) 97.5 F (36.4 C) (Oral)  Resp 20  Ht 5\' 6"  (1.676 m)  Wt 132 lb 4.4 oz (60 kg)  BMI 21.36 kg/m2  SpO2 93% Physical Exam  Nursing note and vitals reviewed. Constitutional:  Awake, alert, nontoxic appearance.  HENT:  Head: Atraumatic.  Eyes: Right eye exhibits no discharge. Left eye exhibits no discharge.  Neck: Neck supple.  Cardiovascular: Regular rhythm.   No murmur heard. Tachycardic  Pulmonary/Chest: He is in respiratory distress. He has no wheezes. He has no rales. He exhibits no tenderness.  Tachypneic and hypoxic with pulse oximetry 90% on 6 L nasal cannula oxygen  Abdominal: Soft. There is no tenderness. There is no rebound.  Musculoskeletal: He exhibits edema. He exhibits no tenderness.  Baseline ROM, no obvious new focal weakness. Trace edema to feet.  Neurological: He is alert.  Mental status and motor strength appears baseline for patient and situation.  Skin: No rash  noted.  Psychiatric: He has a normal mood and affect.    ED Course  Procedures (including critical care time) Patient / Family / Caregiver understand and agree with initial ED impression and plan with expectations set for ED visit.d/w [facility] for admit. Labs Review Labs Reviewed  CBC WITH DIFFERENTIAL - Abnormal; Notable for the following:    RBC 3.85 (*)    Hemoglobin 10.6 (*)    HCT 31.7 (*)    Platelets 431 (*)    Monocytes Absolute 1.2 (*)    All other components within normal limits  COMPREHENSIVE METABOLIC PANEL - Abnormal; Notable for the following:    Sodium 134  (*)    Glucose, Bld 114 (*)    Albumin 2.5 (*)    Total Bilirubin 0.2 (*)    GFR calc non Af Amer 66 (*)    GFR calc Af Amer 77 (*)    All other components within normal limits  URINALYSIS, ROUTINE W REFLEX MICROSCOPIC - Abnormal; Notable for the following:    Specific Gravity, Urine 1.032 (*)    Protein, ur 30 (*)    All other components within normal limits  URINE MICROSCOPIC-ADD ON - Abnormal; Notable for the following:    Casts HYALINE CASTS (*)    All other components within normal limits  TROPONIN I - Abnormal; Notable for the following:    Troponin I 0.33 (*)    All other components within normal limits  PRO B NATRIURETIC PEPTIDE - Abnormal; Notable for the following:    Pro B Natriuretic peptide (BNP) 888.5 (*)    All other components within normal limits  BASIC METABOLIC PANEL - Abnormal; Notable for the following:    Sodium 134 (*)    Glucose, Bld 124 (*)    Calcium 8.3 (*)    GFR calc non Af Amer 78 (*)    All other components within normal limits  CBC - Abnormal; Notable for the following:    RBC 3.59 (*)    Hemoglobin 9.7 (*)    HCT 29.8 (*)    All other components within normal limits  BASIC METABOLIC PANEL - Abnormal; Notable for the following:    Glucose, Bld 116 (*)    GFR calc non Af Amer 79 (*)    All other components within normal limits  CBC - Abnormal; Notable for the following:    RBC 3.72 (*)    Hemoglobin 10.2 (*)    HCT 30.9 (*)    Platelets 462 (*)    All other components within normal limits  CULTURE, BLOOD (ROUTINE X 2)  CULTURE, BLOOD (ROUTINE X 2)  URINE CULTURE  MRSA PCR SCREENING  CULTURE, EXPECTORATED SPUTUM-ASSESSMENT  CULTURE, RESPIRATORY (NON-EXPECTORATED)  TROPONIN I  TROPONIN I  TSH  INFLUENZA PANEL BY PCR  TROPONIN I  MAGNESIUM  PHOSPHORUS  VANCOMYCIN, TROUGH  CG4 I-STAT (LACTIC ACID)   Imaging Review Dg Chest Port 1 View  08/12/2013   CLINICAL DATA:  Pneumonia.  EXAM: PORTABLE CHEST - 1 VIEW  COMPARISON:  08/11/2013.   FINDINGS: PICC line in stable position. Persistent right upper lobe atelectasis and infiltrate present. Tiny right pleural effusion. Left lung is clear. No pneumothorax. Cardiovascular structures are stable. No acute osseous abnormality.  IMPRESSION: 1. Persistent atelectasis and infiltrate right upper lobe. 2. PICC line in good anatomic position.   Electronically Signed   By: Maisie Fus  Register   On: 08/12/2013 07:05   Dg Chest Port 1 View  08/11/2013  CLINICAL DATA:  Pneumonia.  EXAM: PORTABLE CHEST - 1 VIEW  COMPARISON:  08/09/2013 and CT 08/07/2013  FINDINGS: PICC line tip is in the SVC region. Minimal change in the parenchymal disease in the right upper lung. There continues to be aeration at the right apex. Lucency in the left lung consistent with emphysema. Heart and mediastinum are within normal limits. No evidence for a large pneumothorax. Elevated humeral heads probably represent underlying rotator cuff disease.  IMPRESSION: No significant change in the right upper lung pneumonia.   Electronically Signed   By: Richarda Overlie M.D.   On: 08/11/2013 07:31    EKG Interpretation    Date/Time:  Sunday August 09 2013 20:15:50 EST Ventricular Rate:  133 PR Interval:  145 QRS Duration: 71 QT Interval:  286 QTC Calculation: 425 R Axis:   83 Text Interpretation:  Sinus tachycardia No significant change since last tracing Confirmed by [facility]  MD, Clotile Whittington ([id number]) on 08/09/2013 8:54:30 PM            MDM   1. HCAP (healthcare-associated pneumonia)   2. Acute-on-chronic respiratory failure   3. COPD (chronic obstructive pulmonary disease) with emphysema   4. Hypothyroidism   5. Abnormal CT scan, lung   6. Necrotizing pneumonia   7. Acute exacerbation of emphysema   8. Pedal edema   9. Leukocytosis, unspecified    The patient appears reasonably stabilized for admission considering the current resources, flow, and capabilities available in the ED at this time, and I doubt any other [facility]  requiring further screening and/or treatment in the ED prior to admission.    Hurman Horn, MD 08/12/13 [phone]

## 2013-08-10 ENCOUNTER — Encounter (HOSPITAL_COMMUNITY): Payer: Self-pay

## 2013-08-10 DIAGNOSIS — R918 Other nonspecific abnormal finding of lung field: Secondary | ICD-10-CM

## 2013-08-10 DIAGNOSIS — Z8701 Personal history of pneumonia (recurrent): Secondary | ICD-10-CM

## 2013-08-10 DIAGNOSIS — J9620 Acute and chronic respiratory failure, unspecified whether with hypoxia or hypercapnia: Secondary | ICD-10-CM

## 2013-08-10 DIAGNOSIS — J438 Other emphysema: Secondary | ICD-10-CM

## 2013-08-10 DIAGNOSIS — R0602 Shortness of breath: Secondary | ICD-10-CM

## 2013-08-10 DIAGNOSIS — R509 Fever, unspecified: Secondary | ICD-10-CM

## 2013-08-10 DIAGNOSIS — J9600 Acute respiratory failure, unspecified whether with hypoxia or hypercapnia: Secondary | ICD-10-CM

## 2013-08-10 DIAGNOSIS — E039 Hypothyroidism, unspecified: Secondary | ICD-10-CM

## 2013-08-10 DIAGNOSIS — J189 Pneumonia, unspecified organism: Secondary | ICD-10-CM

## 2013-08-10 LAB — URINE CULTURE
Colony Count: NO GROWTH
Culture: NO GROWTH

## 2013-08-10 LAB — EXPECTORATED SPUTUM ASSESSMENT W GRAM STAIN, RFLX TO RESP C: Special Requests: NORMAL

## 2013-08-10 LAB — BASIC METABOLIC PANEL
BUN: 16 mg/dL (ref 6–23)
CO2: 25 mEq/L (ref 19–32)
Calcium: 8.3 mg/dL — ABNORMAL LOW (ref 8.4–10.5)
Chloride: 100 mEq/L (ref 96–112)
Creatinine, Ser: 0.88 mg/dL (ref 0.50–1.35)
GFR calc Af Amer: >90 mL/min (ref >90)
GFR calc non Af Amer: 78 mL/min — ABNORMAL LOW (ref >90)
Glucose, Bld: 124 mg/dL — ABNORMAL HIGH (ref 70–99)
Potassium: 3.7 mEq/L (ref 3.5–5.1)
Sodium: 134 mEq/L — ABNORMAL LOW (ref 135–145)

## 2013-08-10 LAB — INFLUENZA PANEL BY PCR (TYPE A & B)
H1N1 flu by pcr: NOT DETECTED
Influenza A By PCR: NEGATIVE
Influenza B By PCR: NEGATIVE

## 2013-08-10 LAB — CBC
HCT: 29.8 % — ABNORMAL LOW (ref 39.0–52.0)
Hemoglobin: 9.7 g/dL — ABNORMAL LOW (ref 13.0–17.0)
MCH: 27 pg (ref 26.0–34.0)
MCHC: 32.6 g/dL (ref 30.0–36.0)
MCV: 83 fL (ref 78.0–100.0)
Platelets: 377 10*3/uL (ref 150–400)
RBC: 3.59 MIL/uL — ABNORMAL LOW (ref 4.22–5.81)
RDW: 14.1 % (ref 11.5–15.5)
WBC: 9.1 10*3/uL (ref 4.0–10.5)

## 2013-08-10 LAB — PRO B NATRIURETIC PEPTIDE: Pro B Natriuretic peptide (BNP): 888.5 pg/mL — ABNORMAL HIGH (ref 0–450)

## 2013-08-10 LAB — TROPONIN I
Troponin I: <0.3 ng/mL (ref <0.30)
Troponin I: <0.3 ng/mL (ref <0.30)
Troponin I: 0.33 ng/mL (ref <0.30)
Troponin I: <0.3 ng/mL (ref <0.30)

## 2013-08-10 LAB — TSH: TSH: 2.51 u[IU]/mL (ref 0.350–4.500)

## 2013-08-10 LAB — MRSA PCR SCREENING: MRSA by PCR: NEGATIVE

## 2013-08-10 MED ORDER — CARVEDILOL 3.125 MG PO TABS
3.1250 mg | ORAL_TABLET | Freq: Two times a day (BID) | ORAL | Status: DC
  Administered 2013-08-10 – 2013-08-17 (×14): 3.125 mg via ORAL
  Filled 2013-08-10 (×16): qty 1

## 2013-08-10 MED ORDER — ONDANSETRON HCL 4 MG/2ML IJ SOLN: 4.0000 mg | Freq: Four times a day (QID) | INTRAMUSCULAR | Status: DC | PRN

## 2013-08-10 MED ORDER — SODIUM CHLORIDE 0.9 % IJ SOLN
3.0000 mL | Freq: Two times a day (BID) | INTRAMUSCULAR | Status: DC
  Administered 2013-08-10 – 2013-08-11 (×2): 3 mL via INTRAVENOUS
  Administered 2013-08-11: 21:00:00 via INTRAVENOUS
  Administered 2013-08-12 – 2013-08-17 (×7): 3 mL via INTRAVENOUS

## 2013-08-10 MED ORDER — ACETAMINOPHEN 325 MG PO TABS: 650.0000 mg | ORAL_TABLET | Freq: Four times a day (QID) | ORAL | Status: DC | PRN

## 2013-08-10 MED ORDER — ENOXAPARIN SODIUM 40 MG/0.4ML ~~LOC~~ SOLN
40.0000 mg | SUBCUTANEOUS | Status: DC
  Administered 2013-08-10 – 2013-08-17 (×8): 40 mg via SUBCUTANEOUS
  Filled 2013-08-10 (×8): qty 0.4

## 2013-08-10 MED ORDER — SODIUM CHLORIDE 0.9 % IJ SOLN: 3.0000 mL | Freq: Two times a day (BID) | INTRAMUSCULAR | Status: DC

## 2013-08-10 MED ORDER — LEVALBUTEROL HCL 0.63 MG/3ML IN NEBU
0.6300 mg | INHALATION_SOLUTION | RESPIRATORY_TRACT | Status: DC | PRN
  Administered 2013-08-10: 0.63 mg via RESPIRATORY_TRACT
  Filled 2013-08-10: qty 3

## 2013-08-10 MED ORDER — ZOLPIDEM TARTRATE 5 MG PO TABS
5.0000 mg | ORAL_TABLET | Freq: Every day | ORAL | Status: DC
  Administered 2013-08-10 – 2013-08-11 (×3): 5 mg via ORAL
  Filled 2013-08-10 (×4): qty 1

## 2013-08-10 MED ORDER — LEVALBUTEROL HCL 0.63 MG/3ML IN NEBU
0.6300 mg | INHALATION_SOLUTION | Freq: Four times a day (QID) | RESPIRATORY_TRACT | Status: DC
  Administered 2013-08-10 – 2013-08-17 (×26): 0.63 mg via RESPIRATORY_TRACT
  Filled 2013-08-10 (×54): qty 3

## 2013-08-10 MED ORDER — LEVALBUTEROL HCL 0.63 MG/3ML IN NEBU
INHALATION_SOLUTION | RESPIRATORY_TRACT | Status: AC
  Administered 2013-08-10: 0.63 mg via RESPIRATORY_TRACT
  Filled 2013-08-10: qty 3

## 2013-08-10 MED ORDER — ACETAMINOPHEN 650 MG RE SUPP: 650.0000 mg | Freq: Four times a day (QID) | RECTAL | Status: DC | PRN

## 2013-08-10 MED ORDER — ADULT MULTIVITAMIN W/MINERALS CH
1.0000 | ORAL_TABLET | Freq: Every morning | ORAL | Status: DC
  Administered 2013-08-10 – 2013-08-17 (×8): 1 via ORAL
  Filled 2013-08-10 (×8): qty 1

## 2013-08-10 MED ORDER — VANCOMYCIN HCL 500 MG IV SOLR
500.0000 mg | Freq: Two times a day (BID) | INTRAVENOUS | Status: DC
  Administered 2013-08-10: 500 mg via INTRAVENOUS
  Filled 2013-08-10 (×2): qty 500

## 2013-08-10 MED ORDER — METHYLPREDNISOLONE SODIUM SUCC 125 MG IJ SOLR
80.0000 mg | Freq: Every day | INTRAMUSCULAR | Status: DC
  Administered 2013-08-10: 80 mg via INTRAVENOUS
  Filled 2013-08-10: qty 1.28

## 2013-08-10 MED ORDER — PIPERACILLIN-TAZOBACTAM 3.375 G IVPB
3.3750 g | Freq: Three times a day (TID) | INTRAVENOUS | Status: DC
  Administered 2013-08-10 – 2013-08-16 (×20): 3.375 g via INTRAVENOUS
  Filled 2013-08-10 (×23): qty 50

## 2013-08-10 MED ORDER — PANTOPRAZOLE SODIUM 40 MG PO TBEC
40.0000 mg | DELAYED_RELEASE_TABLET | Freq: Every day | ORAL | Status: DC
  Administered 2013-08-10 – 2013-08-17 (×8): 40 mg via ORAL
  Filled 2013-08-10 (×9): qty 1

## 2013-08-10 MED ORDER — ONDANSETRON HCL 4 MG PO TABS: 4.0000 mg | ORAL_TABLET | Freq: Four times a day (QID) | ORAL | Status: DC | PRN

## 2013-08-10 MED ORDER — ZOLPIDEM TARTRATE 10 MG PO TABS: 10.0000 mg | ORAL_TABLET | Freq: Every day | ORAL | Status: DC

## 2013-08-10 MED ORDER — LEVALBUTEROL HCL 0.63 MG/3ML IN NEBU: 0.6300 mg | INHALATION_SOLUTION | Freq: Four times a day (QID) | RESPIRATORY_TRACT | Status: DC | PRN

## 2013-08-10 MED ORDER — OXYCODONE-ACETAMINOPHEN 5-325 MG PO TABS
1.0000 | ORAL_TABLET | Freq: Every day | ORAL | Status: DC
  Administered 2013-08-10 – 2013-08-16 (×8): 1 via ORAL
  Filled 2013-08-10 (×8): qty 1

## 2013-08-10 MED ORDER — IPRATROPIUM BROMIDE 0.02 % IN SOLN
RESPIRATORY_TRACT | Status: AC
  Administered 2013-08-10: 0.5 mg via RESPIRATORY_TRACT
  Filled 2013-08-10: qty 2.5

## 2013-08-10 MED ORDER — BUDESONIDE 0.25 MG/2ML IN SUSP
RESPIRATORY_TRACT | Status: AC
  Administered 2013-08-10: 0.25 mg via RESPIRATORY_TRACT
  Filled 2013-08-10: qty 2

## 2013-08-10 MED ORDER — DOXAZOSIN MESYLATE 8 MG PO TABS
8.0000 mg | ORAL_TABLET | Freq: Every day | ORAL | Status: DC
  Administered 2013-08-10 – 2013-08-16 (×8): 8 mg via ORAL
  Filled 2013-08-10 (×10): qty 1

## 2013-08-10 MED ORDER — SODIUM CHLORIDE 0.9 % IV BOLUS (SEPSIS)
1000.0000 mL | Freq: Once | INTRAVENOUS | Status: AC
  Administered 2013-08-10: 1000 mL via INTRAVENOUS

## 2013-08-10 MED ORDER — CIPROFLOXACIN IN D5W 400 MG/200ML IV SOLN
400.0000 mg | Freq: Two times a day (BID) | INTRAVENOUS | Status: DC
  Administered 2013-08-10: 400 mg via INTRAVENOUS
  Filled 2013-08-10 (×2): qty 200

## 2013-08-10 MED ORDER — LEVOTHYROXINE SODIUM 75 MCG PO TABS
75.0000 ug | ORAL_TABLET | Freq: Every day | ORAL | Status: DC
  Administered 2013-08-10 – 2013-08-17 (×8): 75 ug via ORAL
  Filled 2013-08-10 (×9): qty 1

## 2013-08-10 MED ORDER — VANCOMYCIN HCL IN DEXTROSE 750-5 MG/150ML-% IV SOLN
750.0000 mg | Freq: Two times a day (BID) | INTRAVENOUS | Status: DC
  Administered 2013-08-10 – 2013-08-12 (×4): 750 mg via INTRAVENOUS
  Filled 2013-08-10 (×4): qty 150

## 2013-08-10 MED ORDER — BUDESONIDE 0.25 MG/2ML IN SUSP
0.2500 mg | Freq: Four times a day (QID) | RESPIRATORY_TRACT | Status: DC
  Administered 2013-08-10 – 2013-08-17 (×26): 0.25 mg via RESPIRATORY_TRACT
  Filled 2013-08-10 (×54): qty 2

## 2013-08-10 MED ORDER — IPRATROPIUM BROMIDE 0.02 % IN SOLN
0.5000 mg | Freq: Four times a day (QID) | RESPIRATORY_TRACT | Status: DC
  Administered 2013-08-10 – 2013-08-17 (×26): 0.5 mg via RESPIRATORY_TRACT
  Filled 2013-08-10 (×25): qty 2.5

## 2013-08-10 MED ORDER — METHYLPREDNISOLONE SODIUM SUCC 40 MG IJ SOLR
40.0000 mg | Freq: Every day | INTRAMUSCULAR | Status: DC
  Administered 2013-08-11: 40 mg via INTRAVENOUS
  Filled 2013-08-10: qty 1

## 2013-08-10 NOTE — Progress Notes (Signed)
[facility]  Patient Status: Active (receiving services up to time of hospitalization)  [facility] is providing the following services: RN and IV abx: Vancomycin, Cipro and Zosyn through 09/06/13  If patient discharges after hours, please call [phone].   Lanae Crumbly 08/10/2013, 11:52 AM

## 2013-08-10 NOTE — Progress Notes (Signed)
ANTIBIOTIC CONSULT NOTE - INITIAL  Pharmacy Consult for Vancomycin, Zosyn, Cipro Indication: Necrotizing Pneumonia  Allergies  Allergen Reactions  . Morphine     REACTION: sweats    Patient Measurements: Height: 5\' 6"  (167.6 cm) Weight: 130 lb (58.968 kg) IBW/kg (Calculated) : 63.8  Vital Signs: Temp: 98.8 F (37.1 C) (11/23 2336) Temp src: Oral (11/23 2336) BP: 133/64 mmHg (11/23 2336) Pulse Rate: 118 (11/23 2336) Intake/Output from previous day: 11/23 0701 - 11/24 0700 In: -  Out: 80 [Urine:80] Intake/Output from this shift: Total I/O In: -  Out: 80 [Urine:80]  Labs:  Recent Labs  08/09/13 2125  WBC 10.0  HGB 10.6*  PLT 431*  CREATININE 1.02   Estimated Creatinine Clearance: 46.6 ml/min (by C-G formula based on Cr of 1.02). No results found for this basename: VANCOTROUGH, VANCOPEAK, VANCORANDOM, GENTTROUGH, GENTPEAK, GENTRANDOM, TOBRATROUGH, TOBRAPEAK, TOBRARND, AMIKACINPEAK, AMIKACINTROU, AMIKACIN,  in the last 72 hours   Microbiology: Recent Results (from the past 720 hour(s))  CULTURE, BLOOD (SINGLE)     Status: None   Collection Time    07/17/13  4:17 PM      Result Value Range Status   Organism ID, Bacteria NO GROWTH 5 DAYS   Final  CULTURE, BLOOD (ROUTINE X 2)     Status: None   Collection Time    07/24/13  7:30 PM      Result Value Range Status   Specimen Description BLOOD LEFT HAND   Final   Special Requests BOTTLES DRAWN AEROBIC AND ANAEROBIC 10CC   Final   Culture  Setup Time     Final   Value: 07/25/2013 01:38     Performed at [facility]   Culture     Final   Value: NO GROWTH 5 DAYS     Performed at [facility]   Report Status 07/31/2013 FINAL   Final  CULTURE, BLOOD (ROUTINE X 2)     Status: None   Collection Time    07/24/13  7:50 PM      Result Value Range Status   Specimen Description BLOOD LEFT ARM   Final   Special Requests BOTTLES DRAWN AEROBIC AND ANAEROBIC 10CC   Final   Culture  Setup Time     Final   Value: 07/25/2013 01:38     Performed at [facility]   Culture     Final   Value: NO GROWTH 5 DAYS     Performed at [facility]   Report Status 07/31/2013 FINAL   Final    Medical History: Past Medical History  Diagnosis Date  . Emphysema   . Allergic rhinitis   . Prostate cancer   . Hypothyroid   . Cataract     s/p removal  . PNA (pneumonia)   . Chronic respiratory failure     oxygen 3L at home    Medications:  Scheduled:  . budesonide  0.25 mg Nebulization Q6H  . doxazosin  8 mg Oral QHS  . enoxaparin (LOVENOX) injection  40 mg Subcutaneous Q24H  . ipratropium  0.5 mg Nebulization Q6H  . levalbuterol  0.63 mg Nebulization Q6H  . levothyroxine  75 mcg Oral QAC breakfast  . multivitamin with minerals  1 tablet Oral q morning - 10a  . oxyCODONE-acetaminophen  1 tablet Oral QHS  . pantoprazole  40 mg Oral Daily  . sodium chloride  3 mL Intravenous Q12H  . sodium chloride  3 mL Intravenous Q12H  . zolpidem  5 mg Oral QHS   Infusions:   Assessment:  79 yr male with complaint of SOB, fever x 1 week and chills.  Patient was started on home via PICC line with Cipro 400mg  IV q12h and Zosyn 3.375gm IV q8h on 11/23  WBC 10, Afebrile currently, Scr = 1.02 with CrCl (CG) ~ 46 ml/min  Received in ED : Cipro 400mg  (23:46), Vancomycin 1gm (22:40) and Zosyn 3.375gm (21: 59)  Cipro, Vancomycin and Zosyn to be continued upon admission for probably necrotizing Pneumonia  Goal of Therapy:  Vancomycin trough level 15-20 mcg/ml  Plan:  Measure antibiotic drug levels at steady state Follow up culture results Cipro 400mg  IV q12h Zosyn 3.375gm IV q8h (each dose infused over 4 hrs) Vancomycin 500mg  IV q12h  Jayd Cadieux, Joselyn Glassman, PharmD 08/10/2013,1:16 AM

## 2013-08-10 NOTE — Consult Note (Signed)
PULMONARY  / CRITICAL CARE MEDICINE  Name: John Black [name]: [id number] DOB: 12/09/1928    ADMISSION DATE:  08/09/2013 CONSULTATION DATE:  08/09/13  REFERRING MD :  Midge Minium PRIMARY SERVICE: [facility]  CHIEF COMPLAINT:   SOB, necrotizing pneumonia  BRIEF PATIENT DESCRIPTION:  79 years old male with PMH relevant for sever COPD on home oxygen, prostate cancer and hypothyroidism. Recently hospitalized with a RUL necrotizing pneumonia. Sent home with PICC line on Zosyn, vancomycin and cipro. Now back in the hospital with increased SOB and low grade fever. Critical care consulted for further recommendations.  SIGNIFICANT EVENTS / STUDIES:  Chest X ray: RUL necrotizing pneumonia.   LINES / TUBES: PICC line  CULTURES: Will follow sputum cultures and blood cultures.  ANTIBIOTICS: - Zosyn - Ciprofloxacin - Vancomycin  HISTORY OF PRESENT ILLNESS:   79 years old male with PMH relevant for sever COPD on home oxygen, prostate cancer and hypothyroidism. Recently hospitalized with a RUL necrotizing pneumonia. Sent home with PICC line on Zosyn, vancomycin and cipro. Now back in the hospital with increased SOB and low grade fever of 100.4 at home. Critical care consulted for further recommendations. At the time of my exam the patient is awake, alert, oriented x 3, hemodynamically stable, saturating 98% on 50% venturi mask. The patient complains of significant SOB at rest. Denies cough, chills, CP, hemoptysis. He complains also of mild wheezing.    PAST MEDICAL HISTORY :  Past Medical History  Diagnosis Date  . Emphysema   . Allergic rhinitis   . Prostate cancer   . Hypothyroid   . Cataract     s/p removal  . PNA (pneumonia)   . Chronic respiratory failure     oxygen 3L at home   Past Surgical History  Procedure Laterality Date  . Rotator cuff repair  [id number]    bilateral  . Transurethral resection of prostate  2001    x2  . Tonsillectomy    .  Adenoidectomy    . Seed implant for prostate cancer  2000   Prior to Admission medications   Medication Sig Start Date End Date Taking? Authorizing Provider  acetaminophen (TYLENOL) 500 MG tablet Take by mouth every 6 (six) hours as needed for mild pain or fever.    Yes Historical Provider, MD  albuterol (PROVENTIL) (5 MG/ML) 0.5% nebulizer solution Take 0.5 mLs (2.5 mg total) by nebulization every 6 (six) hours. 07/27/13  Yes Travarus S Minor, NP  budesonide (PULMICORT) 0.25 MG/2ML nebulizer solution Take 2 mLs (0.25 mg total) by nebulization every 6 (six) hours. 07/27/13  Yes Vilinda Blanks Minor, NP  Calcium-Magnesium-Vitamin D (CITRACAL CALCIUM+D PO) Take 1 tablet by mouth every morning.    Yes Historical Provider, MD  ciprofloxacin (CIPRO IN D5W) 400 MG/200ML SOLN Inject 200 mLs (400 mg total) into the vein every 12 (twelve) hours. X 30 days 08/07/13  Yes Tammy S Parrett, NP  doxazosin (CARDURA) 8 MG tablet Take 8 mg by mouth at bedtime. 06/02/13  Yes Salley Scarlet, MD  ibuprofen (ADVIL,MOTRIN) 400 MG tablet Take 400 mg by mouth every 6 (six) hours as needed for fever, headache or mild pain.    Yes Historical Provider, MD  ipratropium (ATROVENT) 0.02 % nebulizer solution Take 2.5 mLs (0.5 mg total) by nebulization every 6 (six) hours. 07/27/13  Yes Aveion S Minor, NP  Ipratropium-Albuterol (COMBIVENT) 20-100 MCG/ACT AERS respimat Inhale 1 puff into the lungs every 6 (six) hours as needed for wheezing or shortness of  breath. 06/26/13  Yes Salley Scarlet, MD  levothyroxine (SYNTHROID, LEVOTHROID) 75 MCG tablet Take 1 tablet (75 mcg total) by mouth daily. 04/16/13  Yes Salley Scarlet, MD  Multiple Vitamin (MULTIVITAMIN WITH MINERALS) TABS tablet Take 1 tablet by mouth every morning.   Yes Historical Provider, MD  oxyCODONE-acetaminophen (PERCOCET/ROXICET) 5-325 MG per tablet Take 1 tablet by mouth at bedtime.   Yes Historical Provider, MD  piperacillin-tazobactam (ZOSYN) 3.375 GM/50ML IVPB Inject  50 mLs (3.375 g total) into the vein every 8 (eight) hours. X 30 days 08/07/13  Yes Tammy S Parrett, NP  sodium chloride 0.9 % SOLN 250 mL with vancomycin 1000 MG SOLR 1,000 mg Inject 1,000 mg into the vein every 12 (twelve) hours. X 30 days 08/07/13  Yes Tammy S Parrett, NP  zolpidem (AMBIEN) 10 MG tablet Take 10 mg by mouth at bedtime.  07/22/13  Yes Historical Provider, MD   Allergies  Allergen Reactions  . Morphine     REACTION: sweats    FAMILY HISTORY:  Family History  Problem Relation Age of Onset  . Heart disease Mother   . Prostate cancer Father    SOCIAL HISTORY:  reports that he quit smoking about 4 years ago. His smoking use included Cigarettes. He has a 75 pack-year smoking history. He does not have any smokeless tobacco history on file. He reports that he drinks about 0.6 ounces of alcohol per week. He reports that he does not use illicit drugs.  REVIEW OF SYSTEMS:  All systems reviewed and found negative except for what I mentioned in the HPI.  SUBJECTIVE:   VITAL SIGNS: Temp:  [98 F (36.7 C)-98.9 F (37.2 C)] 98 F (36.7 C) (11/24 0402) Pulse Rate:  [88-136] 88 (11/24 0400) Resp:  [14-32] 14 (11/24 0400) BP: (88-133)/(44-66) 88/44 mmHg (11/24 0400) SpO2:  [83 %-97 %] 95 % (11/24 0400) FiO2 (%):  [50 %] 50 % (11/24 0400) Weight:  [130 lb (58.968 kg)] 130 lb (58.968 kg) (11/23 2017) HEMODYNAMICS:   VENTILATOR SETTINGS: Vent Mode:  [-]  FiO2 (%):  [50 %] 50 % INTAKE / OUTPUT: Intake/Output     11/23 0701 - 11/24 0700   Urine (mL/kg/hr) 80   Total Output 80   Net -80       Urine Occurrence 1 x   Stool Occurrence 1 x     PHYSICAL EXAMINATION: General: Mild to moderate respiratory distress. Eyes: Anicteric sclerae. ENT: Oropharynx clear. Dry mucous membranes. No thrush Lymph: No cervical, supraclavicular, or axillary lymphadenopathy. Heart: Normal S1, S2. No murmurs, rubs, or gallops appreciated. No bruits, equal pulses. Lungs: Right lung scattered  crackles. Mild bilateral wheezing. Abdomen: Abdomen soft, non-tender and not distended, normoactive bowel sounds. No hepatosplenomegaly or masses. Musculoskeletal: No clubbing or synovitis. 1 to 2 + LE edema. Skin: No rashes or lesions Neuro: No focal neurologic deficits.  LABS:  CBC  Recent Labs Lab 08/09/13 2125 08/10/13 0245  WBC 10.0 9.1  HGB 10.6* 9.7*  HCT 31.7* 29.8*  PLT 431* 377   Coag's No results found for this basename: APTT, INR,  in the last 168 hours BMET  Recent Labs Lab 08/09/13 2125 08/10/13 0245  NA 134* 134*  K 4.1 3.7  CL 99 100  CO2 27 25  BUN 20 16  CREATININE 1.02 0.88  GLUCOSE 114* 124*   Electrolytes  Recent Labs Lab 08/09/13 2125 08/10/13 0245  CALCIUM 8.9 8.3*   Sepsis Markers  Recent Labs Lab 08/09/13 2156  LATICACIDVEN 0.70   ABG No results found for this basename: PHART, PCO2ART, PO2ART,  in the last 168 hours Liver Enzymes  Recent Labs Lab 08/09/13 2125  AST 23  ALT 25  ALKPHOS 69  BILITOT 0.2*  ALBUMIN 2.5*   Cardiac Enzymes  Recent Labs Lab 08/10/13 0245  TROPONINI <0.30  PROBNP 888.5*   Glucose No results found for this basename: GLUCAP,  in the last 168 hours  Imaging Ir Fluoro Guide Cv Line Right  08/08/2013   CLINICAL DATA:  COPD, pneumonia and need for long-term IV antibiotic therapy.  EXAM: POWER PICC LINE PLACEMENT WITH ULTRASOUND AND FLUOROSCOPIC GUIDANCE  FLUOROSCOPY TIME:  24 seconds.  PROCEDURE: The patient was advised of the possible risks and complications and agreed to undergo the procedure. The patient was then brought to the angiographic suite for the procedure.  The right arm was prepped with chlorhexidine, draped in the usual sterile fashion using maximum barrier technique (cap and mask, sterile gown, sterile gloves, large sterile sheet, hand hygiene and cutaneous antisepsis) and infiltrated locally with 1% Lidocaine.  Ultrasound demonstrated patency of the right brachial vein, and this  was documented with an image. Under real-time ultrasound guidance, this vein was accessed with a 21 gauge micropuncture needle and image documentation was performed. A 0.018 wire was introduced in to the vein. Over this, a 37 cm, 5 [place] single lumen power injectable PICC was advanced to the lower SVC/right atrial junction. Fluoroscopy during the procedure and fluoro spot radiograph confirms appropriate catheter position. The catheter was flushed and covered with a sterile dressing.  Complications: None  IMPRESSION: Successful right arm power injectable PICC line placement with ultrasound and fluoroscopic guidance. The catheter is ready for use.   Electronically Signed   By: Irish Lack M.D.   On: 08/08/2013 12:43   Ir US Guide Vasc Access Right  08/08/2013   CLINICAL DATA:  COPD, pneumonia and need for long-term IV antibiotic therapy.  EXAM: POWER PICC LINE PLACEMENT WITH ULTRASOUND AND FLUOROSCOPIC GUIDANCE  FLUOROSCOPY TIME:  24 seconds.  PROCEDURE: The patient was advised of the possible risks and complications and agreed to undergo the procedure. The patient was then brought to the angiographic suite for the procedure.  The right arm was prepped with chlorhexidine, draped in the usual sterile fashion using maximum barrier technique (cap and mask, sterile gown, sterile gloves, large sterile sheet, hand hygiene and cutaneous antisepsis) and infiltrated locally with 1% Lidocaine.  Ultrasound demonstrated patency of the right brachial vein, and this was documented with an image. Under real-time ultrasound guidance, this vein was accessed with a 21 gauge micropuncture needle and image documentation was performed. A 0.018 wire was introduced in to the vein. Over this, a 37 cm, 5 [place] single lumen power injectable PICC was advanced to the lower SVC/right atrial junction. Fluoroscopy during the procedure and fluoro spot radiograph confirms appropriate catheter position. The catheter was flushed and covered  with a sterile dressing.  Complications: None  IMPRESSION: Successful right arm power injectable PICC line placement with ultrasound and fluoroscopic guidance. The catheter is ready for use.   Electronically Signed   By: Irish Lack M.D.   On: 08/08/2013 12:43   Dg Chest Port 1 View  08/09/2013   CLINICAL DATA:  Pneumonia for 5 weeks with increasing shortness of breath and continued fever  EXAM: PORTABLE CHEST - 1 VIEW  COMPARISON:  08/07/2013  FINDINGS: Unchanged right upper lobe consolidation with areas of lucency within it suggesting  necrosis. Right PICC line identified with tip to the cavoatrial junction. Tiny right effusion. Left lung is clear.  IMPRESSION: No significant change from prior study. Right upper lobe necrotic pneumonia again identified.   Electronically Signed   By: Esperanza Heir M.D.   On: 08/09/2013 21:35     CXR:   RUL necrotizing pneumonia unchanged. May have mild improvement of RLL infiltrates previously seen on chest X ray from 11/7.  ASSESSMENT / PLAN:  PULMONARY A: 1) RUL necrotizing pneumonia. Chest X ray with increased density of RUL consolidation. Improvement of RLL infiltrates previously seen on X ray from 11/7. No elevated WBC, no fever since admission. Only 100.4 at home. Not clear to me if this is real antibiotic failure. Given relative stability I would be more inclined to continue same antibiotics for now, get culture data and change antibiotics only based on cultures   P:   - Continue Zosyn, cipro and vancomycin - Pulmicort and duonebs - Consider IV steroids if persistent wheezing despite breathing treatments  - Chest PT, Acapella, incentive spirometry - Supplemental oxygen to keep O2 sat >92% - Will follow sputum cultures (sample sent)  Code status: I had an extensive conversation with the patient, his wife and daughter. I explained that is possible that his respiratory staus would get worse and this would require intubation and mechanical  ventilation. I also explained that given the severity of his pneumonia and his severe oxygen dependent COPD the probability of coming of the ventilator and recover with good quality of life is significantly decreased. I also explained what CPR is about. After our conversation, the patient was very clear that he would not want to be intubated and / or resuscitated and if his respiratory status would get to the point where he needs intubation he would like to be comfortable. I changed his code status to DNR / DNI   I have personally obtained a history, examined the patient, evaluated laboratory and imaging results, formulated the assessment and plan and placed orders. CRITICAL CARE: Critical Care Time devoted to patient care services described in this note is 60 minutes.   Overton Mam, MD Pulmonary and Critical Care Medicine [facility] Pager: [phone]  08/10/2013, 4:48 AM

## 2013-08-10 NOTE — Progress Notes (Signed)
CRITICAL VALUE ALERT  Critical value received: Troponin 0.33  Date of notification:  08/10/13  Time of notification:  1438  Critical value read back:yes  Nurse who received alert:  Gae Dry, RN   MD notified (1st page):  Carolyne Littles, MD  Time of first page:  1440  MD notified (2nd page):  Time of second page:  Responding MD:      Time MD responded:

## 2013-08-10 NOTE — Progress Notes (Signed)
[facility] PROGRESS NOTE  John Black WGN:[id number] DOB: 1929/03/12 DOA: 08/09/2013 PCP: Milinda Antis, MD  Assessment/Plan: Acute on chronic respiratory failure probably worsened because of patient's necrotizing pneumonia - continue the vancomycin Zosyn and Cipro. Check sputum cultures. I have consulted pulmonary critical care for further recommendations.  Necrotizing PNA; continue antibiotics per pulmonology -Saw patient on methylprednisolone 80 mg every 24hr secondary to wheezing  COPD - continue nebulizer and Pulmicort. Venturi mask 50% to maintain patient's SpO2 > 93%. - cont Flutter valve during waking hrs   Hypothyroidism - TSH; pending.    Code Status: DNR Family Communication:  Disposition Plan:    Consultants:  Pulmonary(DR Guinevere Ferrari)   Procedures: Sputum culture 11/23; pending  Blood culture 11/23; pending  MRSA PCR; Negative  PICC line placed right arm 08/08/2013  CT chest without contrast 08/07/2013 Increase in extent and severity of necrotic right upper lobe  pneumonia. There are now cavitary areas within the consolidative  process concerning for pulmonary abscess.   CXR 08/07/2013 Worsening right upper lobe pneumonitis as described above.  Emphysematous changes are also appreciated with air-fluid levels.  Antibiotics:  Ciprofloxacin 11/24>>  Zosyn 11/24>>  Vancomycin 11/24>>    HPI/Subjective: John Black is a 79 y.o. PMHX hyponatremia, thyroidism, emphysema (on 3 L O2 at home), acute on chronic respiratory failure, male who was recently diagnosed with necrotizing pneumonia has had a CAT scan of his chest yesterday and was found to have worsening of his pneumonia with cavitations. Patient was started on vancomycin Zosyn and Cipro as outpatient after PICC line was placed by patient's pulmonologist. Despite which patient's breathing got worse and was advised to come to the ER. Patient usually uses 4 L of oxygen and  presently needs to use 6 L to maintain saturation more than 90% and patient is also using accessory muscles to breathe. Chest x-ray does not show anything worsening. Patient in addition has some chest tightness. Patient's daughter states that earlier today patient had fever with temperatures around 101F. Patient otherwise denies any nausea vomiting abdominal pain diarrhea. TODAY states he was initially seen at [facility] x 2 over last couple of weeks but his breathing cont. To worsen. States Say his Pulmonologist last wk who admitted him to [facility] and placed on ABxand sent him hm w/ PICC line and ABX. After one day O2 requirement continue to Inc and was running fever of 100.6. That's when he decieded to return here last night.      Objective: Filed Vitals:   08/10/13 0402 08/10/13 0500 08/10/13 0518 08/10/13 0600  BP:  123/58  91/42  Pulse:  122  90  Temp: 98 F (36.7 C)     TempSrc: Oral     Resp:  30  29  Height:      Weight:  60.9 kg (134 lb 4.2 oz)    SpO2:  95% 96% 98%    Intake/Output Summary (Last 24 hours) at 08/10/13 0715 Last data filed at 08/10/13 0600  Gross per 24 hour  Intake    330 ml  Output     80 ml  Net    250 ml   Filed Weights   08/09/13 2017 08/10/13 0115 08/10/13 0500  Weight: 58.968 kg (130 lb) 60.4 kg (133 lb 2.5 oz) 60.9 kg (134 lb 4.2 oz)    Exam:   General:  A/O x 4, NAD  Cardiovascular:  RR, Tachycardia, (-) M/R/G, DP/PT pulse +2  Respiratory:  RUL/RML/RLL decreased air movement w/  Inspitory/Expitory wheeze. LUL Expitory wheeze  Abdomen: Soft , NT, ND, (+) BS  Musculoskeletal: (-) Pedal Edema   Data Reviewed: Basic Metabolic Panel:  Recent Labs Lab 08/09/13 2125 08/10/13 0245  NA 134* 134*  K 4.1 3.7  CL 99 100  CO2 27 25  GLUCOSE 114* 124*  BUN 20 16  CREATININE 1.02 0.88  CALCIUM 8.9 8.3*   Liver Function Tests:  Recent Labs Lab 08/09/13 2125  AST 23  ALT 25  ALKPHOS 69  BILITOT 0.2*  PROT 6.4  ALBUMIN 2.5*    No results found for this basename: LIPASE, AMYLASE,  in the last 168 hours No results found for this basename: AMMONIA,  in the last 168 hours CBC:  Recent Labs Lab 08/09/13 2125 08/10/13 0245  WBC 10.0 9.1  NEUTROABS 7.3  --   HGB 10.6* 9.7*  HCT 31.7* 29.8*  MCV 82.3 83.0  PLT 431* 377   Cardiac Enzymes:  Recent Labs Lab 08/10/13 0245  TROPONINI <0.30   BNP (last 3 results)  Recent Labs  07/24/13 2004 08/10/13 0245  PROBNP 335.2 888.5*   CBG: No results found for this basename: GLUCAP,  in the last 168 hours  Recent Results (from the past 240 hour(s))  MRSA PCR SCREENING     Status: None   Collection Time    08/10/13  1:58 AM      Result Value Range Status   MRSA by PCR NEGATIVE  NEGATIVE Final   Comment:            The GeneXpert MRSA Assay (FDA     approved for NASAL specimens     only), is one component of a     comprehensive MRSA colonization     surveillance program. It is not     intended to diagnose MRSA     infection nor to guide or     monitor treatment for     MRSA infections.  CULTURE, EXPECTORATED SPUTUM-ASSESSMENT     Status: None   Collection Time    08/10/13  4:27 AM      Result Value Range Status   Specimen Description SPUTUM   Final   Special Requests Normal   Final   Sputum evaluation     Final   Value: THIS SPECIMEN IS ACCEPTABLE. RESPIRATORY CULTURE REPORT TO FOLLOW.   Report Status 08/10/2013 FINAL   Final     Studies: Ir Fluoro Guide Cv Line Right  08/08/2013   CLINICAL DATA:  COPD, pneumonia and need for long-term IV antibiotic therapy.  EXAM: POWER PICC LINE PLACEMENT WITH ULTRASOUND AND FLUOROSCOPIC GUIDANCE  FLUOROSCOPY TIME:  24 seconds.  PROCEDURE: The patient was advised of the possible risks and complications and agreed to undergo the procedure. The patient was then brought to the angiographic suite for the procedure.  The right arm was prepped with chlorhexidine, draped in the usual sterile fashion using maximum  barrier technique (cap and mask, sterile gown, sterile gloves, large sterile sheet, hand hygiene and cutaneous antisepsis) and infiltrated locally with 1% Lidocaine.  Ultrasound demonstrated patency of the right brachial vein, and this was documented with an image. Under real-time ultrasound guidance, this vein was accessed with a 21 gauge micropuncture needle and image documentation was performed. A 0.018 wire was introduced in to the vein. Over this, a 37 cm, 5 [place] single lumen power injectable PICC was advanced to the lower SVC/right atrial junction. Fluoroscopy during the procedure and fluoro spot radiograph confirms appropriate  catheter position. The catheter was flushed and covered with a sterile dressing.  Complications: None  IMPRESSION: Successful right arm power injectable PICC line placement with ultrasound and fluoroscopic guidance. The catheter is ready for use.   Electronically Signed   By: Irish Lack M.D.   On: 08/08/2013 12:43   Ir US Guide Vasc Access Right  08/08/2013   CLINICAL DATA:  COPD, pneumonia and need for long-term IV antibiotic therapy.  EXAM: POWER PICC LINE PLACEMENT WITH ULTRASOUND AND FLUOROSCOPIC GUIDANCE  FLUOROSCOPY TIME:  24 seconds.  PROCEDURE: The patient was advised of the possible risks and complications and agreed to undergo the procedure. The patient was then brought to the angiographic suite for the procedure.  The right arm was prepped with chlorhexidine, draped in the usual sterile fashion using maximum barrier technique (cap and mask, sterile gown, sterile gloves, large sterile sheet, hand hygiene and cutaneous antisepsis) and infiltrated locally with 1% Lidocaine.  Ultrasound demonstrated patency of the right brachial vein, and this was documented with an image. Under real-time ultrasound guidance, this vein was accessed with a 21 gauge micropuncture needle and image documentation was performed. A 0.018 wire was introduced in to the vein. Over this, a 37 cm,  5 [place] single lumen power injectable PICC was advanced to the lower SVC/right atrial junction. Fluoroscopy during the procedure and fluoro spot radiograph confirms appropriate catheter position. The catheter was flushed and covered with a sterile dressing.  Complications: None  IMPRESSION: Successful right arm power injectable PICC line placement with ultrasound and fluoroscopic guidance. The catheter is ready for use.   Electronically Signed   By: Irish Lack M.D.   On: 08/08/2013 12:43   Dg Chest Port 1 View  08/09/2013   CLINICAL DATA:  Pneumonia for 5 weeks with increasing shortness of breath and continued fever  EXAM: PORTABLE CHEST - 1 VIEW  COMPARISON:  08/07/2013  FINDINGS: Unchanged right upper lobe consolidation with areas of lucency within it suggesting necrosis. Right PICC line identified with tip to the cavoatrial junction. Tiny right effusion. Left lung is clear.  IMPRESSION: No significant change from prior study. Right upper lobe necrotic pneumonia again identified.   Electronically Signed   By: Esperanza Heir M.D.   On: 08/09/2013 21:35    Scheduled Meds: . budesonide  0.25 mg Nebulization Q6H  . ciprofloxacin  400 mg Intravenous Q12H  . doxazosin  8 mg Oral QHS  . enoxaparin (LOVENOX) injection  40 mg Subcutaneous Q24H  . ipratropium  0.5 mg Nebulization Q6H  . levalbuterol  0.63 mg Nebulization Q6H  . levothyroxine  75 mcg Oral QAC breakfast  . multivitamin with minerals  1 tablet Oral q morning - 10a  . oxyCODONE-acetaminophen  1 tablet Oral QHS  . pantoprazole  40 mg Oral Daily  . piperacillin-tazobactam (ZOSYN)  IV  3.375 g Intravenous Q8H  . sodium chloride  3 mL Intravenous Q12H  . sodium chloride  3 mL Intravenous Q12H  . vancomycin  500 mg Intravenous Q12H  . zolpidem  5 mg Oral QHS   Continuous Infusions:   Active Problems:   Hypothyroidism   Acute-on-chronic respiratory failure   HCAP (healthcare-associated pneumonia)/ Probable necrotizing  PNA    Time spent:   WOODS, CURTIS, J  [facility] Pager [phone]. If 7PM-7AM, please contact night-coverage at [email or web address], password [facility] 08/10/2013, 7:15 AM  LOS: 1 day

## 2013-08-10 NOTE — Progress Notes (Signed)
Quick Note:  Already done per addendum to 11.21.14 ov w/ TP. ______

## 2013-08-10 NOTE — Consult Note (Signed)
[facility]  Total days of antibiotics 2        Day piptazo 2        Day vanco 2        Day cipro 2       Reason for Consult:necrotizing pneumonia    Referring Physician: feinstein  Active Problems:   PROSTATE CANCER   COPD (chronic obstructive pulmonary disease) with emphysema   Hypothyroidism   Acute-on-chronic respiratory failure   Hyponatremia   HCAP (healthcare-associated pneumonia)/ Probable necrotizing PNA    HPI: John Black is a 79 y.o. male with COPD,emphysema who was originally hospitalized in Oct 20- Oct 25th for pneumonia with evidence of cavitary lesion, we was ruled out for mTB and discharged on IV vancomycin for 14 days, cultures at that time were negative except for c.albicans.  He was seen at the completion of his IV antibiotics in follow up on 11/7 with pulmonology where he was reporting low grade fevers, still having SOB and productive cough, cxr appeared slight worse at that time; thus, he was re-admitted from 11/7-11/10 to start piptazo for 14 days and transitioned to amox/clav plus cipro for an additional 7 days. Again he was seen at the end of IV antibiotics in follow up on 11/21 at pulmonology clinic, his CXR appeared worse thus, the plan was to revert back to IV antibiotics with vanco, piptazo and cipro to start on 11/22 when picc line was placed by o/p IR. The patient during this time had ongoing fevers and dry cough but mainly increase work of breathing +/- wheezing the night prior to admission. Home health RN advised them to go to the ED on 11/23 for feeling worse and having increasing shortness of breath. Temp of 100.6. No leukocytosis. He was admitted and continued on home antibiotics of vanco, cipro, and piptazo. Repeat CT shows dense consolidation throughout the right upper lobe. The process again spares the middle and lower lobes, but shows much greater severity of consolidation and is much more extensive than it was previously.  There is volume loss now in the right upper lobe with elevation of the fissures. There are dense air bronchograms throughout much of the right upper lobe, and there are at least 2 cavities demonstrating air-fluid levels. Repeat sputum cultures taken, but patient thought to be too frail to undergo bronch or chest tube for drainage. He remains afebrile.  Past Medical History  Diagnosis Date  . Emphysema   . Allergic rhinitis   . Prostate cancer   . Hypothyroid   . Cataract     s/p removal  . PNA (pneumonia)   . Chronic respiratory failure     oxygen 3L at home    Allergies:  Allergies  Allergen Reactions  . Morphine     REACTION: sweats    MEDICATIONS: . budesonide  0.25 mg Nebulization Q6H  . doxazosin  8 mg Oral QHS  . enoxaparin (LOVENOX) injection  40 mg Subcutaneous Q24H  . ipratropium  0.5 mg Nebulization Q6H  . levalbuterol  0.63 mg Nebulization Q6H  . levothyroxine  75 mcg Oral QAC breakfast  . [START ON 08/11/2013] methylPREDNISolone (SOLU-MEDROL) injection  40 mg Intravenous Daily  . multivitamin with minerals  1 tablet Oral q morning - 10a  . oxyCODONE-acetaminophen  1 tablet Oral QHS  . pantoprazole  40 mg Oral Daily  . piperacillin-tazobactam (ZOSYN)  IV  3.375 g Intravenous Q8H  . sodium chloride  3 mL Intravenous Q12H  .  vancomycin  500 mg Intravenous Q12H  . zolpidem  5 mg Oral QHS    History  Substance Use Topics  . Smoking status: Former Smoker -- 1.50 packs/day for 50 years    Types: Cigarettes    Quit date: 09/17/2008  . Smokeless tobacco: Never Used  . Alcohol Use: 0.6 oz/week    1 Glasses of wine per week     Comment: each evening    Family History  Problem Relation Age of Onset  . Heart disease Mother   . Prostate cancer Father     Review of Systems  Constitutional: positive for fever, chills, diaphoresis, activity change, appetite change, fatigue and unexpected weight change.  HENT: Negative for congestion, sore throat, rhinorrhea,  sneezing, trouble swallowing and sinus pressure.  Eyes: Negative for photophobia and visual disturbance.  Respiratory: positive for dry, nonproductive cough, and shortness of breath but no chest tightness,  wheezing and stridor.  Cardiovascular: Negative for chest pain, palpitations and leg swelling.  Gastrointestinal: Negative for nausea, vomiting, abdominal pain, diarrhea, constipation, blood in stool, abdominal distention and anal bleeding.  Genitourinary: Negative for dysuria, hematuria, flank pain and difficulty urinating.  Musculoskeletal: Negative for myalgias, back pain, joint swelling, arthralgias and gait problem.  Skin: Negative for color change, pallor, rash and wound.  Neurological: Negative for dizziness, tremors, weakness and light-headedness.  Hematological: Negative for adenopathy. Does not bruise/bleed easily.  Psychiatric/Behavioral: Negative for behavioral problems, confusion, sleep disturbance, dysphoric mood, decreased concentration and agitation.  Ext: mild increase in lower extremity swelling    OBJECTIVE: Temp:  [97.6 F (36.4 C)-98.9 F (37.2 C)] 97.6 F (36.4 C) (11/24 0800) Pulse Rate:  [88-136] 108 (11/24 1200) Resp:  [14-32] 24 (11/24 1200) BP: (88-133)/(42-66) 122/54 mmHg (11/24 1330) SpO2:  [83 %-99 %] 91 % (11/24 1200) FiO2 (%):  [50 %] 50 % (11/24 0518) Weight:  [130 lb (58.968 kg)-134 lb 4.2 oz (60.9 kg)] 134 lb 4.2 oz (60.9 kg) (11/24 0500)  Physical Exam  Constitutional: He is oriented to person, place, and time. He appears well-developed and well-nourished. No distress.  HENT:  Mouth/Throat: Oropharynx is clear and moist. No oropharyngeal exudate.  Cardiovascular: Normal rate, regular rhythm and normal heart sounds. Exam reveals no gallop and no friction rub.  No murmur heard.  Pulmonary/Chest: Effort normal and breath sounds normal, except on right mid lung area with decrease breath sounds, e-> a changes. No respiratory distress. He has no  wheezes.  Abdominal: Soft. Bowel sounds are normal. He exhibits no distension. There is no tenderness.  Lymphadenopathy:  He has no cervical adenopathy.  Neurological: He is alert and oriented to person, place, and time.  Skin: Skin is warm and dry. No rash noted. No erythema.  Psychiatric: He has a normal mood and affect. His behavior is normal.  Ext: trace edema    LABS: Results for orders placed during the hospital encounter of 08/09/13 (from the past 48 hour(s))  URINALYSIS, ROUTINE W REFLEX MICROSCOPIC     Status: Abnormal   Collection Time    08/09/13  9:14 PM      Result Value Range   Color, Urine YELLOW  YELLOW   APPearance CLEAR  CLEAR   Specific Gravity, Urine 1.032 (*) 1.005 - 1.030   pH 5.5  5.0 - 8.0   Glucose, UA NEGATIVE  NEGATIVE mg/dL   Hgb urine dipstick NEGATIVE  NEGATIVE   Bilirubin Urine NEGATIVE  NEGATIVE   Ketones, ur NEGATIVE  NEGATIVE mg/dL   Protein,  ur 30 (*) NEGATIVE mg/dL   Urobilinogen, UA 0.2  0.0 - 1.0 mg/dL   Nitrite NEGATIVE  NEGATIVE   Leukocytes, UA NEGATIVE  NEGATIVE  URINE MICROSCOPIC-ADD ON     Status: Abnormal   Collection Time    08/09/13  9:14 PM      Result Value Range   WBC, UA 0-2  <3 WBC/hpf   RBC / HPF 0-2  <3 RBC/hpf   Bacteria, UA RARE  RARE   Casts HYALINE CASTS (*) NEGATIVE  CBC WITH DIFFERENTIAL     Status: Abnormal   Collection Time    08/09/13  9:25 PM      Result Value Range   WBC 10.0  4.0 - 10.5 K/uL   RBC 3.85 (*) 4.22 - 5.81 MIL/uL   Hemoglobin 10.6 (*) 13.0 - 17.0 g/dL   HCT 16.1 (*) 09.6 - 04.5 %   MCV 82.3  78.0 - 100.0 fL   MCH 27.5  26.0 - 34.0 pg   MCHC 33.4  30.0 - 36.0 g/dL   RDW 40.9  81.1 - 91.4 %   Platelets 431 (*) 150 - 400 K/uL   Neutrophils Relative % 73  43 - 77 %   Neutro Abs 7.3  1.7 - 7.7 K/uL   Lymphocytes Relative 12  12 - 46 %   Lymphs Abs 1.2  0.7 - 4.0 K/uL   Monocytes Relative 12  3 - 12 %   Monocytes Absolute 1.2 (*) 0.1 - 1.0 K/uL   Eosinophils Relative 3  0 - 5 %    Eosinophils Absolute 0.3  0.0 - 0.7 K/uL   Basophils Relative 0  0 - 1 %   Basophils Absolute 0.0  0.0 - 0.1 K/uL  COMPREHENSIVE METABOLIC PANEL     Status: Abnormal   Collection Time    08/09/13  9:25 PM      Result Value Range   Sodium 134 (*) 135 - 145 mEq/L   Potassium 4.1  3.5 - 5.1 mEq/L   Chloride 99  96 - 112 mEq/L   CO2 27  19 - 32 mEq/L   Glucose, Bld 114 (*) 70 - 99 mg/dL   BUN 20  6 - 23 mg/dL   Creatinine, Ser 7.82  0.50 - 1.35 mg/dL   Calcium 8.9  8.4 - 95.6 mg/dL   Total Protein 6.4  6.0 - 8.3 g/dL   Albumin 2.5 (*) 3.5 - 5.2 g/dL   AST 23  0 - 37 U/L   ALT 25  0 - 53 U/L   Alkaline Phosphatase 69  39 - 117 U/L   Total Bilirubin 0.2 (*) 0.3 - 1.2 mg/dL   GFR calc non Af Amer 66 (*) >90 mL/min   GFR calc Af Amer 77 (*) >90 mL/min   Comment: (NOTE)     The eGFR has been calculated using the CKD EPI equation.     This calculation has not been validated in all clinical situations.     eGFR's persistently <90 mL/min signify possible Chronic Kidney     Disease.  CG4 I-STAT (LACTIC ACID)     Status: None   Collection Time    08/09/13  9:56 PM      Result Value Range   Lactic Acid, Venous 0.70  0.5 - 2.2 mmol/L  MRSA PCR SCREENING     Status: None   Collection Time    08/10/13  1:58 AM      Result Value Range  MRSA by PCR NEGATIVE  NEGATIVE   Comment:            The GeneXpert MRSA Assay (FDA     approved for NASAL specimens     only), is one component of a     comprehensive MRSA colonization     surveillance program. It is not     intended to diagnose MRSA     infection nor to guide or     monitor treatment for     MRSA infections.  TROPONIN I     Status: None   Collection Time    08/10/13  2:45 AM      Result Value Range   Troponin I <0.30  <0.30 ng/mL   Comment:            Due to the release kinetics of cTnI,     a negative result within the first hours     of the onset of symptoms does not rule out     myocardial infarction with certainty.     If  myocardial infarction is still suspected,     repeat the test at appropriate intervals.  PRO B NATRIURETIC PEPTIDE     Status: Abnormal   Collection Time    08/10/13  2:45 AM      Result Value Range   Pro B Natriuretic peptide (BNP) 888.5 (*) 0 - 450 pg/mL  TSH     Status: None   Collection Time    08/10/13  2:45 AM      Result Value Range   TSH 2.510  0.350 - 4.500 uIU/mL   Comment: Performed at [facility]  BASIC METABOLIC PANEL     Status: Abnormal   Collection Time    08/10/13  2:45 AM      Result Value Range   Sodium 134 (*) 135 - 145 mEq/L   Potassium 3.7  3.5 - 5.1 mEq/L   Chloride 100  96 - 112 mEq/L   CO2 25  19 - 32 mEq/L   Glucose, Bld 124 (*) 70 - 99 mg/dL   BUN 16  6 - 23 mg/dL   Creatinine, Ser 5.62  0.50 - 1.35 mg/dL   Calcium 8.3 (*) 8.4 - 10.5 mg/dL   GFR calc non Af Amer 78 (*) >90 mL/min   GFR calc Af Amer >90  >90 mL/min   Comment: (NOTE)     The eGFR has been calculated using the CKD EPI equation.     This calculation has not been validated in all clinical situations.     eGFR's persistently <90 mL/min signify possible Chronic Kidney     Disease.  CBC     Status: Abnormal   Collection Time    08/10/13  2:45 AM      Result Value Range   WBC 9.1  4.0 - 10.5 K/uL   RBC 3.59 (*) 4.22 - 5.81 MIL/uL   Hemoglobin 9.7 (*) 13.0 - 17.0 g/dL   HCT 13.0 (*) 86.5 - 78.4 %   MCV 83.0  78.0 - 100.0 fL   MCH 27.0  26.0 - 34.0 pg   MCHC 32.6  30.0 - 36.0 g/dL   RDW 69.6  29.5 - 28.4 %   Platelets 377  150 - 400 K/uL  INFLUENZA PANEL BY PCR     Status: None   Collection Time    08/10/13  2:50 AM      Result Value Range   Influenza  A By PCR NEGATIVE  NEGATIVE   Influenza B By PCR NEGATIVE  NEGATIVE   H1N1 flu by pcr NOT DETECTED  NOT DETECTED   Comment:            The Xpert Flu assay (FDA approved for     nasal aspirates or washes and     nasopharyngeal swab specimens), is     intended as an aid in the diagnosis of     influenza and should not be  used as     a sole basis for treatment.     Performed at [facility]  CULTURE, EXPECTORATED SPUTUM-ASSESSMENT     Status: None   Collection Time    08/10/13  4:27 AM      Result Value Range   Specimen Description SPUTUM     Special Requests Normal     Sputum evaluation       Value: THIS SPECIMEN IS ACCEPTABLE. RESPIRATORY CULTURE REPORT TO FOLLOW.   Report Status 08/10/2013 FINAL    TROPONIN I     Status: None   Collection Time    08/10/13 11:10 AM      Result Value Range   Troponin I <0.30  <0.30 ng/mL   Comment:            Due to the release kinetics of cTnI,     a negative result within the first hours     of the onset of symptoms does not rule out     myocardial infarction with certainty.     If myocardial infarction is still suspected,     repeat the test at appropriate intervals.    MICRO: 11/24 respiratory cx: pending  IMAGING: Dg Chest Port 1 View  08/09/2013   CLINICAL DATA:  Pneumonia for 5 weeks with increasing shortness of breath and continued fever  EXAM: PORTABLE CHEST - 1 VIEW  COMPARISON:  08/07/2013  FINDINGS: Unchanged right upper lobe consolidation with areas of lucency within it suggesting necrosis. Right PICC line identified with tip to the cavoatrial junction. Tiny right effusion. Left lung is clear.  IMPRESSION: No significant change from prior study. Right upper lobe necrotic pneumonia again identified.   Electronically Signed   By: Esperanza Heir M.D.   On: 08/09/2013 21:35   Chest ct:There is dense consolidation throughout the right upper lobe. The  process again spares the middle and lower lobes, but shows much  greater severity of consolidation and is much more extensive than it  was previously. There is volume loss now in the right upper lobe  with elevation of the fissures. There are dense air bronchograms  throughout much of the right upper lobe, and there are at least 2  cavities demonstrating air-fluid levels. The 1st of these is on    series 2, image number 16, and measures about 2 cm. The 2nd is on  image number 20 and measures about 3 cm. There is no pleural  effusion. There is no evidence of chest wall destruction.  Bilateral calcified pleural plaques are again identified. Diffuse  emphysematous change is stable. There is again noted to be  adenopathy with the largest lymph node located in the precarinal  area, measuring in short axis 2.5 cm. It is unchanged as are other  smaller lymph nodes.  No pericardial or cardiac abnormality of acute nature. There is  coronary arterial calcification. There is calcification of the aorta  again identified. There are no acute osseous abnormalities. Scans  through the upper abdomen show no   Assessment/Plan:  79yo M with necrotizing pneumonia starting in late oct, has been on intermittent courses of IV and oral antibiotics, he was readmitted for worsening shortness of breath, on-going fever, and CT findings of worsening disease.  - continue with vanco and piptazo. Can d/c cipro since no isolation of drug resistant PsA/GNR  as of yet - await sputum culture to see if can narrow antibiotic courses - necrotizing pneumonias can take many weeks to resolve.  - will provide final recs as culture results return. Aim to have oral regimen since patient's wife reports difficulty doing IV therapy.   Duke Salvia Drue Second MD MPH [facility] [phone]

## 2013-08-10 NOTE — Progress Notes (Signed)
ANTIBIOTIC CONSULT NOTE - Follow up  Pharmacy Consult for Vancomycin, Zosyn Indication: Necrotizing Pneumonia  Allergies  Allergen Reactions  . Morphine     REACTION: sweats    Patient Measurements: Height: 5\' 6"  (167.6 cm) Weight: 134 lb 4.2 oz (60.9 kg) IBW/kg (Calculated) : 63.8  Vital Signs: Temp: 97.6 F (36.4 C) (11/24 0800) Temp src: Oral (11/24 0800) BP: 122/54 mmHg (11/24 1330) Pulse Rate: 108 (11/24 1200) Intake/Output from previous day: 11/23 0701 - 11/24 0700 In: 330 [IV Piggyback:150] Out: 80 [Urine:80] Intake/Output from this shift: Total I/O In: -  Out: 200 [Urine:200]  Labs:  Recent Labs  08/09/13 2125 08/10/13 0245  WBC 10.0 9.1  HGB 10.6* 9.7*  PLT 431* 377  CREATININE 1.02 0.88   Estimated Creatinine Clearance: 55.7 ml/min (by C-G formula based on Cr of 0.88). No results found for this basename: VANCOTROUGH, Leodis Binet, VANCORANDOM, GENTTROUGH, GENTPEAK, GENTRANDOM, TOBRATROUGH, TOBRAPEAK, TOBRARND, AMIKACINPEAK, AMIKACINTROU, AMIKACIN,  in the last 72 hours   Microbiology: Recent Results (from the past 720 hour(s))  CULTURE, BLOOD (SINGLE)     Status: None   Collection Time    07/17/13  4:17 PM      Result Value Range Status   Organism ID, Bacteria NO GROWTH 5 DAYS   Final  CULTURE, BLOOD (ROUTINE X 2)     Status: None   Collection Time    07/24/13  7:30 PM      Result Value Range Status   Specimen Description BLOOD LEFT HAND   Final   Special Requests BOTTLES DRAWN AEROBIC AND ANAEROBIC 10CC   Final   Culture  Setup Time     Final   Value: 07/25/2013 01:38     Performed at [facility]   Culture     Final   Value: NO GROWTH 5 DAYS     Performed at [facility]   Report Status 07/31/2013 FINAL   Final  CULTURE, BLOOD (ROUTINE X 2)     Status: None   Collection Time    07/24/13  7:50 PM      Result Value Range Status   Specimen Description BLOOD LEFT ARM   Final   Special Requests BOTTLES DRAWN AEROBIC AND  ANAEROBIC 10CC   Final   Culture  Setup Time     Final   Value: 07/25/2013 01:38     Performed at [facility]   Culture     Final   Value: NO GROWTH 5 DAYS     Performed at [facility]   Report Status 07/31/2013 FINAL   Final  MRSA PCR SCREENING     Status: None   Collection Time    08/10/13  1:58 AM      Result Value Range Status   MRSA by PCR NEGATIVE  NEGATIVE Final   Comment:            The GeneXpert MRSA Assay (FDA     approved for NASAL specimens     only), is one component of a     comprehensive MRSA colonization     surveillance program. It is not     intended to diagnose MRSA     infection nor to guide or     monitor treatment for     MRSA infections.  CULTURE, EXPECTORATED SPUTUM-ASSESSMENT     Status: None   Collection Time    08/10/13  4:27 AM      Result Value Range Status  Specimen Description SPUTUM   Final   Special Requests Normal   Final   Sputum evaluation     Final   Value: THIS SPECIMEN IS ACCEPTABLE. RESPIRATORY CULTURE REPORT TO FOLLOW.   Report Status 08/10/2013 FINAL   Final    Assessment: 79 yo M with complaint of SOB, fever x 1 week and chills. Dx with necrotizing pna, PICC placed, and started home therapy with Vanc, Zosyn, Cipro on 11/23. Breathing got worse so he was advised to come to the ED.  11/23 >> Cipro >> 11/24 (DC'd by ID) 11/23 >> Vanc >> 11/23 >> Zosyn >>   Tmax: AF WBCs: wnl Renal: wnl, 56CG, 65N  11/23 blood x 2: sent 11/23 urine: sent 11/24 sputum:  11/24 MRSA PCR: neg  Goal of Therapy:  Vancomycin trough level 15-20 mcg/ml  Plan:   Based on normalized CrCl, will increase Vanc to 750mg  IV q12h.   Cont Zosyn 3.375g IV Q8H infused over 4hrs.  Measure Vanc trough at steady state.  Follow up renal fxn and culture results.  Charolotte Eke, PharmD, pager [phone]. 08/10/2013,2:29 PM.

## 2013-08-10 NOTE — Consult Note (Signed)
PULMONARY  / CRITICAL CARE MEDICINE  Name: John Black [name]: [id number] DOB: 10-06-1928    ADMISSION DATE:  08/09/2013 CONSULTATION DATE:  08/09/13  REFERRING MD :  Midge Minium PRIMARY SERVICE: [facility]  CHIEF COMPLAINT:   SOB, necrotizing pneumonia  BRIEF PATIENT DESCRIPTION:  79 y/o male with PMH relevant for severe COPD on home oxygen, prostate cancer and hypothyroidism. Recently hospitalized with a RUL necrotizing pneumonia. Sent home with PICC line with abx. Now back in the hospital with increased SOB and low grade fever. Critical care consulted for further recommendations.  SIGNIFICANT EVENTS / STUDIES:  11/21 - Chest X ray: RUL necrotizing pneumonia. 11/23 - Re-Admit, reported dyspnea at rest 11/24 - improved dyspnea, desats with activity to mid 80's  LINES / TUBES: 10/25 PICC line>>>  CULTURES: BCx2 11/23>>> UC 11/23>>> Sputum 11/23>>>  ANTIBIOTICS: Zosyn 11/23>>> Ciprofloxacin 11/23>>> Vancomycin 11/23>>>  SUBJECTIVE: Pt reports improved respiratory status, less dyspnea.  No fever overnight.   VITAL SIGNS: Temp:  [98 F (36.7 C)-98.9 F (37.2 C)] 98 F (36.7 C) (11/24 0402) Pulse Rate:  [88-136] 103 (11/24 0800) Resp:  [14-32] 18 (11/24 0800) BP: (88-133)/(42-66) 108/58 mmHg (11/24 0800) SpO2:  [83 %-98 %] 93 % (11/24 0800) FiO2 (%):  [50 %] 50 % (11/24 0518) Weight:  [130 lb (58.968 kg)-134 lb 4.2 oz (60.9 kg)] 134 lb 4.2 oz (60.9 kg) (11/24 0500)  HEMODYNAMICS:    VENTILATOR SETTINGS: Vent Mode:  [-]  FiO2 (%):  [50 %] 50 %  INTAKE / OUTPUT: Intake/Output     11/23 0701 - 11/24 0700 11/24 0701 - 11/25 0700   Other 180    IV Piggyback 150    Total Intake(mL/kg) 330 (5.4)    Urine (mL/kg/hr) 80    Total Output 80     Net +250          Urine Occurrence 1 x    Stool Occurrence 1 x      PHYSICAL EXAMINATION: General: wdwn elderly male in NAD Eyes: Anicteric sclerae. ENT: Oropharynx clear. Dry mucous membranes. No  thrush Lymph: No cervical, supraclavicular, or axillary lymphadenopathy. Heart: Normal S1, S2. No murmurs, rubs, or gallops appreciated. No bruits, equal pulses. Lungs: Right lung with few scattered crackles.  Few sibilant wheezes on R Abdomen: Abdomen soft, non-tender and not distended, normoactive bowel sounds.  Musculoskeletal: No clubbing or synovitis. 1 to 2 + LE edema. Skin: No rashes or lesions Neuro: No focal neurologic deficits.  LABS:  CBC  Recent Labs Lab 08/09/13 2125 08/10/13 0245  WBC 10.0 9.1  HGB 10.6* 9.7*  HCT 31.7* 29.8*  PLT 431* 377   Coag's No results found for this basename: APTT, INR,  in the last 168 hours BMET  Recent Labs Lab 08/09/13 2125 08/10/13 0245  NA 134* 134*  K 4.1 3.7  CL 99 100  CO2 27 25  BUN 20 16  CREATININE 1.02 0.88  GLUCOSE 114* 124*   Electrolytes  Recent Labs Lab 08/09/13 2125 08/10/13 0245  CALCIUM 8.9 8.3*   Sepsis Markers  Recent Labs Lab 08/09/13 2156  LATICACIDVEN 0.70   ABG No results found for this basename: PHART, PCO2ART, PO2ART,  in the last 168 hours Liver Enzymes  Recent Labs Lab 08/09/13 2125  AST 23  ALT 25  ALKPHOS 69  BILITOT 0.2*  ALBUMIN 2.5*   Cardiac Enzymes  Recent Labs Lab 08/10/13 0245  TROPONINI <0.30  PROBNP 888.5*   Glucose No results found for this basename: GLUCAP,  in the last 168 hours  Imaging Ir Fluoro Guide Cv Line Right  08/08/2013   CLINICAL DATA:  COPD, pneumonia and need for long-term IV antibiotic therapy.  EXAM: POWER PICC LINE PLACEMENT WITH ULTRASOUND AND FLUOROSCOPIC GUIDANCE  FLUOROSCOPY TIME:  24 seconds.  PROCEDURE: The patient was advised of the possible risks and complications and agreed to undergo the procedure. The patient was then brought to the angiographic suite for the procedure.  The right arm was prepped with chlorhexidine, draped in the usual sterile fashion using maximum barrier technique (cap and mask, sterile gown, sterile gloves,  large sterile sheet, hand hygiene and cutaneous antisepsis) and infiltrated locally with 1% Lidocaine.  Ultrasound demonstrated patency of the right brachial vein, and this was documented with an image. Under real-time ultrasound guidance, this vein was accessed with a 21 gauge micropuncture needle and image documentation was performed. A 0.018 wire was introduced in to the vein. Over this, a 37 cm, 5 [place] single lumen power injectable PICC was advanced to the lower SVC/right atrial junction. Fluoroscopy during the procedure and fluoro spot radiograph confirms appropriate catheter position. The catheter was flushed and covered with a sterile dressing.  Complications: None  IMPRESSION: Successful right arm power injectable PICC line placement with ultrasound and fluoroscopic guidance. The catheter is ready for use.   Electronically Signed   By: Irish Lack M.D.   On: 08/08/2013 12:43   Ir US Guide Vasc Access Right  08/08/2013   CLINICAL DATA:  COPD, pneumonia and need for long-term IV antibiotic therapy.  EXAM: POWER PICC LINE PLACEMENT WITH ULTRASOUND AND FLUOROSCOPIC GUIDANCE  FLUOROSCOPY TIME:  24 seconds.  PROCEDURE: The patient was advised of the possible risks and complications and agreed to undergo the procedure. The patient was then brought to the angiographic suite for the procedure.  The right arm was prepped with chlorhexidine, draped in the usual sterile fashion using maximum barrier technique (cap and mask, sterile gown, sterile gloves, large sterile sheet, hand hygiene and cutaneous antisepsis) and infiltrated locally with 1% Lidocaine.  Ultrasound demonstrated patency of the right brachial vein, and this was documented with an image. Under real-time ultrasound guidance, this vein was accessed with a 21 gauge micropuncture needle and image documentation was performed. A 0.018 wire was introduced in to the vein. Over this, a 37 cm, 5 [place] single lumen power injectable PICC was advanced to  the lower SVC/right atrial junction. Fluoroscopy during the procedure and fluoro spot radiograph confirms appropriate catheter position. The catheter was flushed and covered with a sterile dressing.  Complications: None  IMPRESSION: Successful right arm power injectable PICC line placement with ultrasound and fluoroscopic guidance. The catheter is ready for use.   Electronically Signed   By: Irish Lack M.D.   On: 08/08/2013 12:43   Dg Chest Port 1 View  08/09/2013   CLINICAL DATA:  Pneumonia for 5 weeks with increasing shortness of breath and continued fever  EXAM: PORTABLE CHEST - 1 VIEW  COMPARISON:  08/07/2013  FINDINGS: Unchanged right upper lobe consolidation with areas of lucency within it suggesting necrosis. Right PICC line identified with tip to the cavoatrial junction. Tiny right effusion. Left lung is clear.  IMPRESSION: No significant change from prior study. Right upper lobe necrotic pneumonia again identified.   Electronically Signed   By: Esperanza Heir M.D.   On: 08/09/2013 21:35    ASSESSMENT / PLAN:  PULMONARY A: RUL necrotizing pneumonia - Chest X ray with increased density of RUL  consolidation. Improvement of RLL infiltrates previously seen on X ray from 11/7. No elevated WBC, no fever since admission. Only 100.4 at home. Not certain this is real antibiotic failure. Given relative stability, more inclined to continue same antibiotics for now, get culture data and change antibiotics only based on cultures.  P:   - Continue Zosyn, cipro and vancomycin, however , can we limit abx exposure? ID to see - Pulmicort and duonebs - Reduce IV steroids, concern for delirium in elderly.  Mild bronchospasm on exam.   - Chest PT, incentive spirometry - Supplemental oxygen to keep O2 sat >92% - Will follow sputum cultures (sample sent) - ID consult for abx input, fevers, is this fever a failure of abx, or inflammation from liquefaction? Not a surgical candidate , sever copd as far as  lobectomy, also d/w primary pulm Dr Shelle Iron - PRN CXR, or with clinical declines - follow fever curve, leukocytosis  Code status: - DNR / DNI  Canary Brim, NP-C [facility] Pgr: [phone] or [phone]   08/10/2013, 9:50 AM   I have fully examined this patient and agree with above findings.    And edited in full  Mcarthur Rossetti. Tyson Alias, MD, FACP Pgr: [phone]  Pulmonary & Critical Care

## 2013-08-11 ENCOUNTER — Inpatient Hospital Stay (HOSPITAL_COMMUNITY): Payer: Medicare Other

## 2013-08-11 DIAGNOSIS — J852 Abscess of lung without pneumonia: Principal | ICD-10-CM

## 2013-08-11 DIAGNOSIS — R609 Edema, unspecified: Secondary | ICD-10-CM

## 2013-08-11 IMAGING — CR DG CHEST 1V PORT
1 series · 2 of 2 positions shown · non-contrast
Comparison: [DATE] and CT [DATE]

CLINICAL DATA: Pneumonia.

EXAM:
PORTABLE CHEST - 1 VIEW

[Series 1: AP · U · 2 of 2 slices shown]
[im 1/2]
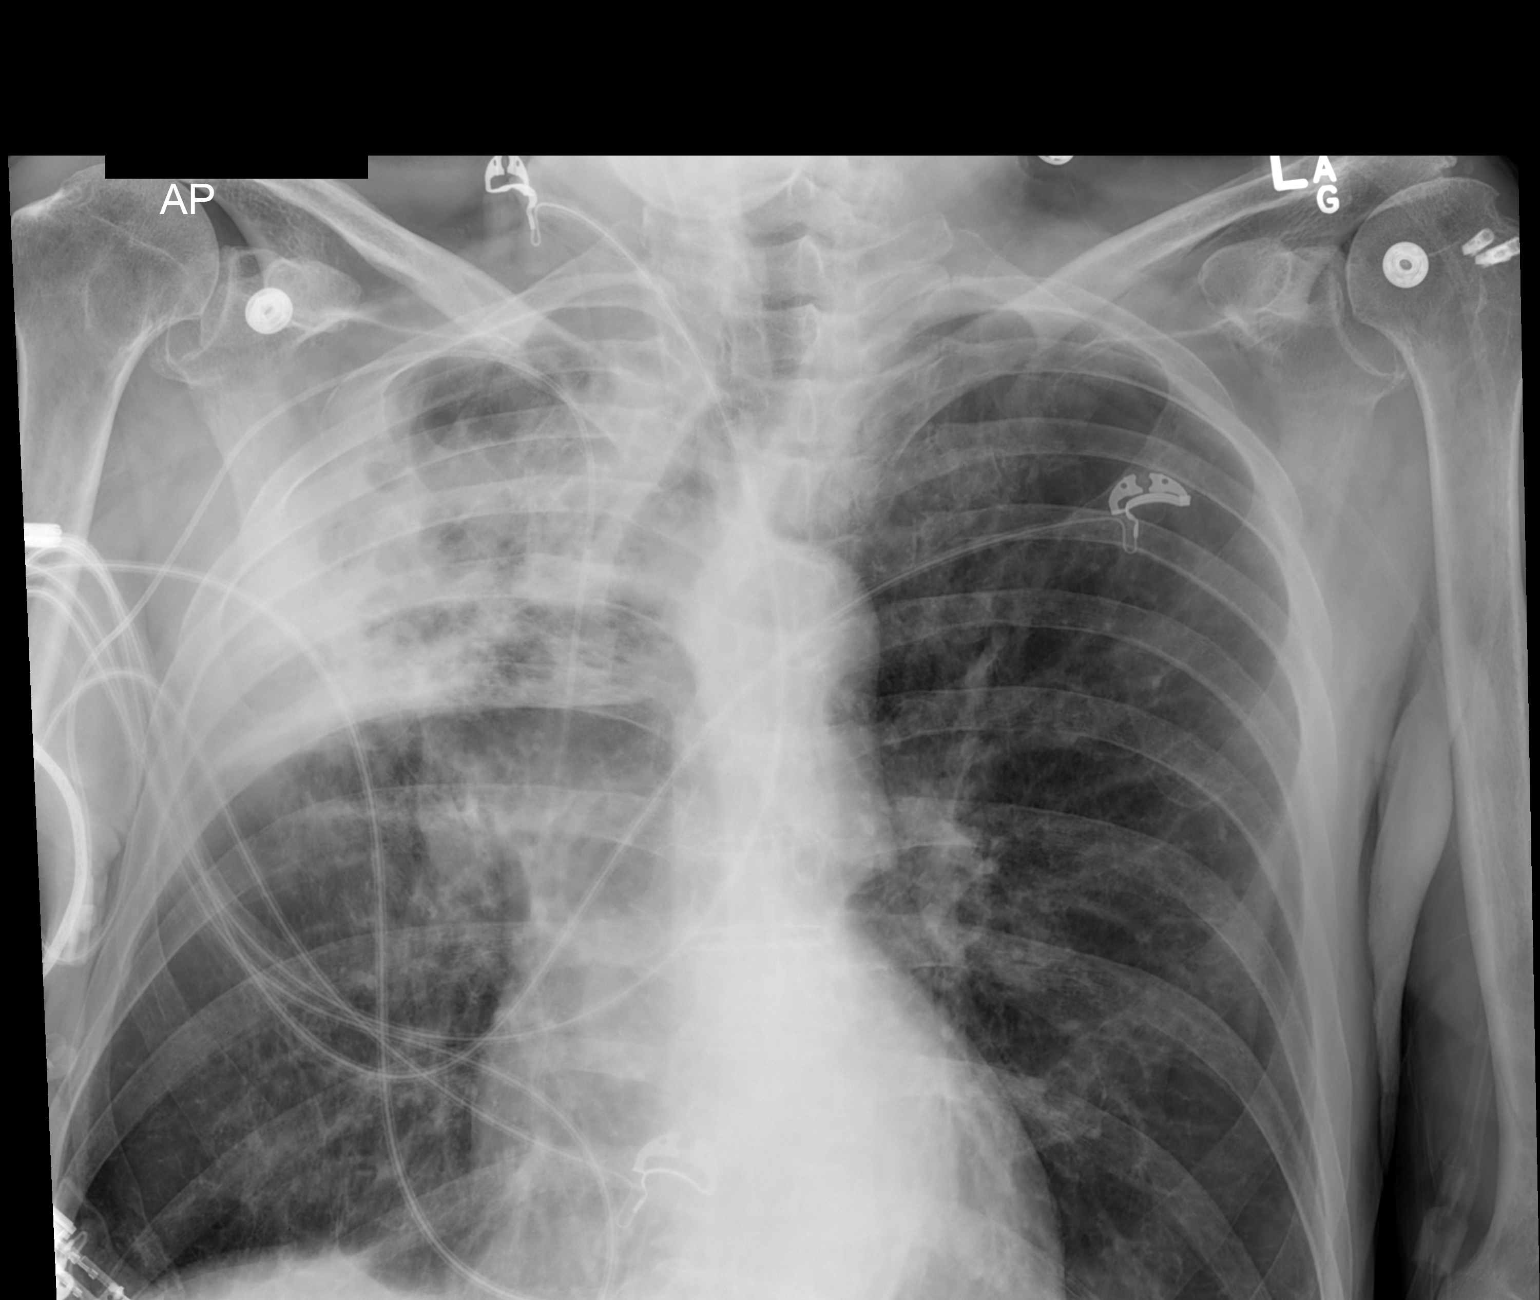
[im 2/2]
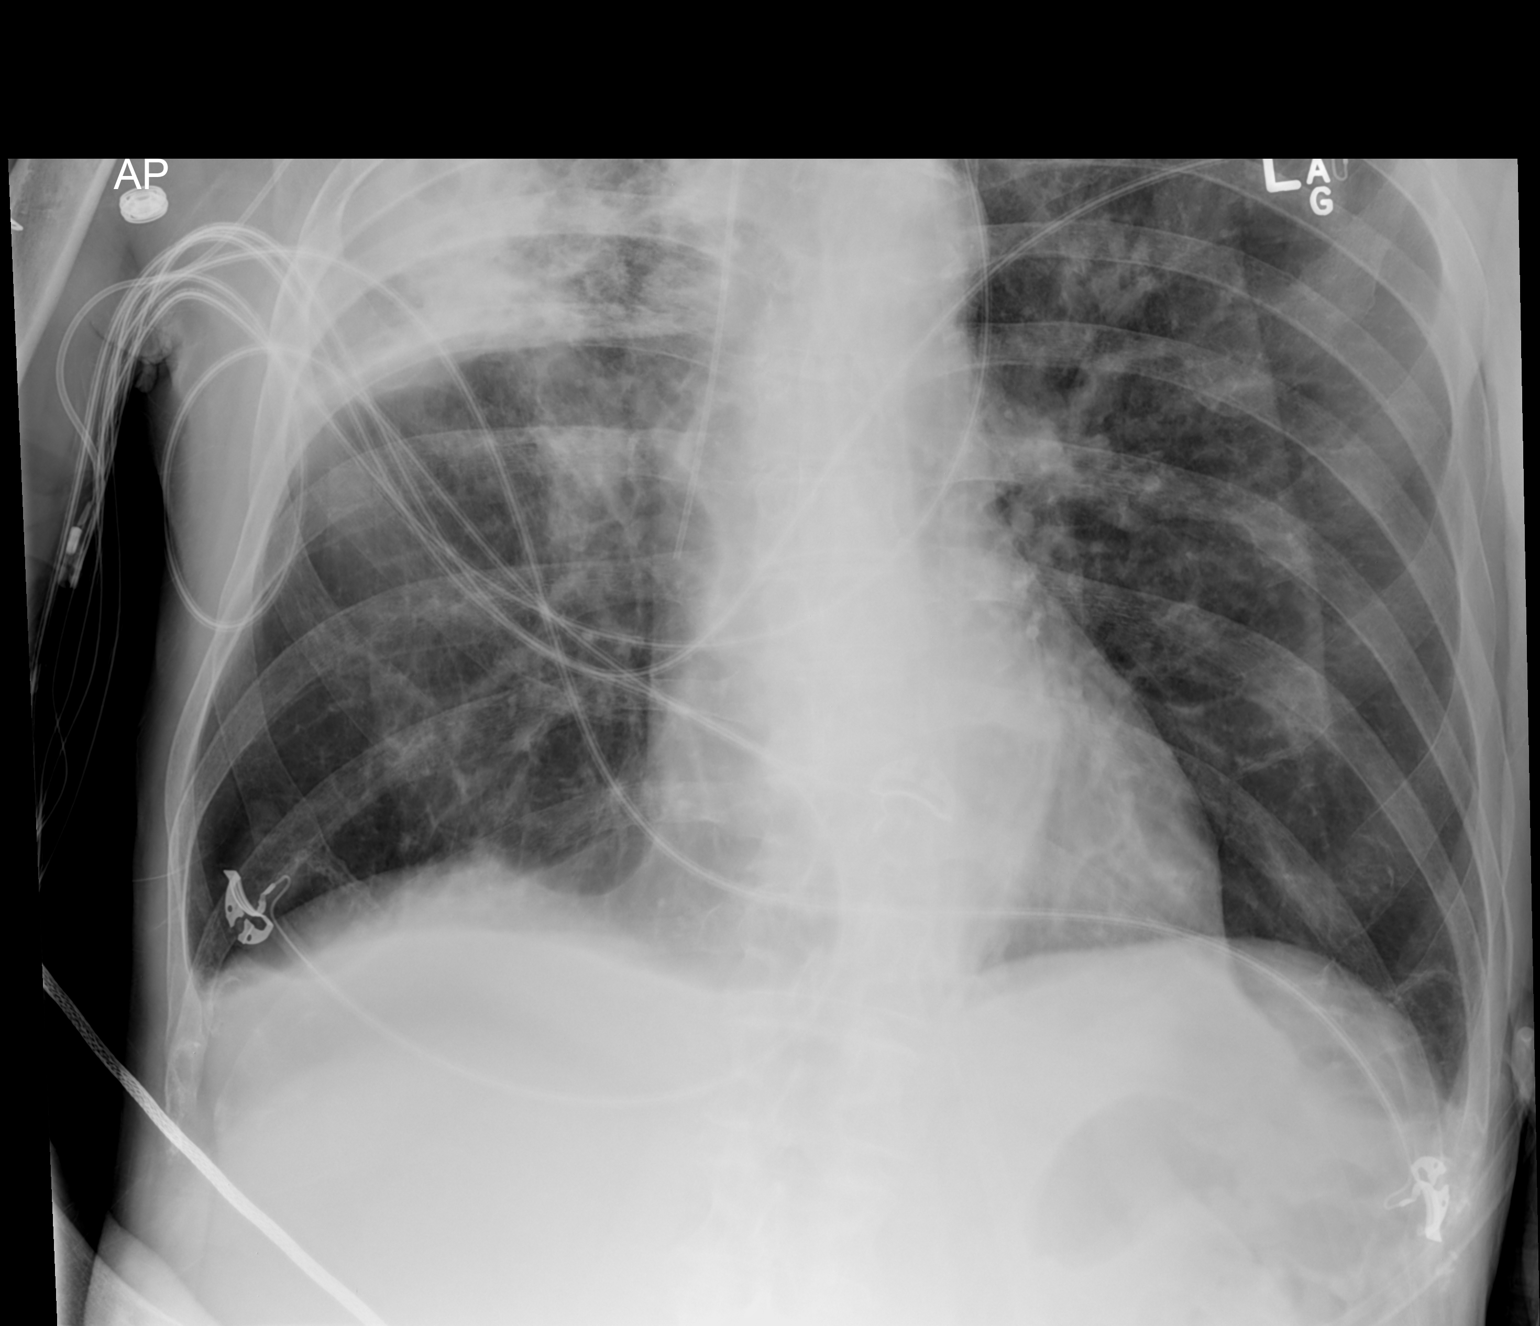

[2 of 2 positions shown; findings below may reference images not displayed]

FINDINGS: PICC line tip is in the SVC region. Minimal change in the
parenchymal disease in the right upper lung. There continues to be
aeration at the right apex. Lucency in the left lung consistent with
emphysema. Heart and mediastinum are within normal limits. No
evidence for a large pneumothorax. Elevated humeral heads probably
represent underlying rotator cuff disease.
IMPRESSION: No significant change in the right upper lung pneumonia.

## 2013-08-11 MED ORDER — METHYLPREDNISOLONE SODIUM SUCC 40 MG IJ SOLR
20.0000 mg | Freq: Every day | INTRAMUSCULAR | Status: DC
  Filled 2013-08-11: qty 0.5

## 2013-08-11 MED ORDER — POTASSIUM CHLORIDE CRYS ER 20 MEQ PO TBCR
20.0000 meq | EXTENDED_RELEASE_TABLET | Freq: Once | ORAL | Status: AC
  Administered 2013-08-11: 20 meq via ORAL
  Filled 2013-08-11: qty 1

## 2013-08-11 MED ORDER — ENSURE COMPLETE PO LIQD
237.0000 mL | Freq: Two times a day (BID) | ORAL | Status: DC
  Administered 2013-08-11 – 2013-08-16 (×9): 237 mL via ORAL

## 2013-08-11 MED ORDER — SACCHAROMYCES BOULARDII 250 MG PO CAPS
250.0000 mg | ORAL_CAPSULE | Freq: Two times a day (BID) | ORAL | Status: DC
  Administered 2013-08-11 – 2013-08-17 (×13): 250 mg via ORAL
  Filled 2013-08-11 (×15): qty 1

## 2013-08-11 NOTE — Progress Notes (Signed)
INITIAL NUTRITION ASSESSMENT  DOCUMENTATION CODES Per approved criteria  -Not Applicable   INTERVENTION: Provide Ensure Complete BID  NUTRITION DIAGNOSIS: Predicted suboptimal energy intake related to decreased appetite, medical condition, and recent weight loss as evidenced by pt's report and pt's chart.    Goal: Pt to meet >/= 90% of their estimated nutrition needs   Monitor:  PO intake Weight Labs  Reason for Assessment: MST  79 y.o. male  Admitting Dx: <principal problem not specified>  ASSESSMENT: 79 y/o male with PMH relevant for severe COPD on home oxygen, prostate cancer and hypothyroidism. Recently hospitalized with a RUL necrotizing pneumonia. Sent home with PICC line with abx. Now back in the hospital with increased SOB and low grade fever.   Pt asleep at time of visit. Per MST report, pt reported having a poor appetite PTA causing recent weight loss. Per RN pt ate fairly well yesterday. Weight history does show some weight loss in the past month (3% weight loss or greater)  Height: Ht Readings from Last 1 Encounters:  08/10/13 5\' 6"  (1.676 m)    Weight: Wt Readings from Last 1 Encounters:  08/11/13 132 lb 4.4 oz (60 kg)    Ideal Body Weight: 142 lbs  % Ideal Body Weight: 93%  Wt Readings from Last 10 Encounters:  08/11/13 132 lb 4.4 oz (60 kg)  08/07/13 129 lb (58.514 kg)  07/27/13 126 lb 8.7 oz (57.4 kg)  07/24/13 131 lb (59.421 kg)  07/17/13 128 lb (58.06 kg)  07/07/13 136 lb 7.4 oz (61.9 kg)  07/02/13 131 lb 2.8 oz (59.5 kg)  07/02/13 132 lb (59.875 kg)  06/30/13 132 lb (59.875 kg)  06/26/13 136 lb (61.689 kg)    Usual Body Weight: 136 lbs  % Usual Body Weight: 97%  BMI:  Body mass index is 21.36 kg/(m^2).  Estimated Nutritional Needs: Kcal: 1450-1650 Protein: 80-90 grams Fluid: 1.5-1.7 L/day  Skin: +2 RLE and LLE edema; intact  Diet Order: General  EDUCATION NEEDS: -No education needs identified at this time   Intake/Output  Summary (Last 24 hours) at 08/11/13 1224 Last data filed at 08/11/13 0626  Gross per 24 hour  Intake    560 ml  Output   1375 ml  Net   -815 ml    Last BM: 11/25   Labs:   Recent Labs Lab 08/09/13 2125 08/10/13 0245  NA 134* 134*  K 4.1 3.7  CL 99 100  CO2 27 25  BUN 20 16  CREATININE 1.02 0.88  CALCIUM 8.9 8.3*  GLUCOSE 114* 124*    CBG (last 3)  No results found for this basename: GLUCAP,  in the last 72 hours  Scheduled Meds: . budesonide  0.25 mg Nebulization Q6H  . carvedilol  3.125 mg Oral BID WC  . doxazosin  8 mg Oral QHS  . enoxaparin (LOVENOX) injection  40 mg Subcutaneous Q24H  . ipratropium  0.5 mg Nebulization Q6H  . levalbuterol  0.63 mg Nebulization Q6H  . levothyroxine  75 mcg Oral QAC breakfast  . [START ON 08/12/2013] methylPREDNISolone (SOLU-MEDROL) injection  20 mg Intravenous Daily  . multivitamin with minerals  1 tablet Oral q morning - 10a  . oxyCODONE-acetaminophen  1 tablet Oral QHS  . pantoprazole  40 mg Oral Daily  . piperacillin-tazobactam (ZOSYN)  IV  3.375 g Intravenous Q8H  . potassium chloride  20 mEq Oral Once  . saccharomyces boulardii  250 mg Oral BID  . sodium chloride  3 mL  Intravenous Q12H  . vancomycin  750 mg Intravenous Q12H  . zolpidem  5 mg Oral QHS    Continuous Infusions:   Past Medical History  Diagnosis Date  . Emphysema   . Allergic rhinitis   . Prostate cancer   . Hypothyroid   . Cataract     s/p removal  . PNA (pneumonia)   . Chronic respiratory failure     oxygen 3L at home    Past Surgical History  Procedure Laterality Date  . Rotator cuff repair  [id number]    bilateral  . Transurethral resection of prostate  2001    x2  . Tonsillectomy    . Adenoidectomy    . Seed implant for prostate cancer  2000    Ian Malkin RD, LDN Inpatient Clinical Dietitian Pager: [phone] After Hours Pager: [phone]

## 2013-08-11 NOTE — Progress Notes (Signed)
[facility]    Date of Admission:  08/09/2013   Total days of antibiotics 3        Day 3 piptazo        Day 3 vanco           ID: John Black is a 79 y.o. male with necrotizing PNA.  Active Problems:   PROSTATE CANCER   COPD (chronic obstructive pulmonary disease) with emphysema   Hypothyroidism   Acute-on-chronic respiratory failure   Hyponatremia   HCAP (healthcare-associated pneumonia)/ Probable necrotizing PNA    Subjective: Improved shortness for breath but noticeable desats with activity  Medications:  . budesonide  0.25 mg Nebulization Q6H  . carvedilol  3.125 mg Oral BID WC  . doxazosin  8 mg Oral QHS  . enoxaparin (LOVENOX) injection  40 mg Subcutaneous Q24H  . feeding supplement (ENSURE COMPLETE)  237 mL Oral BID BM  . ipratropium  0.5 mg Nebulization Q6H  . levalbuterol  0.63 mg Nebulization Q6H  . levothyroxine  75 mcg Oral QAC breakfast  . [START ON 08/12/2013] methylPREDNISolone (SOLU-MEDROL) injection  20 mg Intravenous Daily  . multivitamin with minerals  1 tablet Oral q morning - 10a  . oxyCODONE-acetaminophen  1 tablet Oral QHS  . pantoprazole  40 mg Oral Daily  . piperacillin-tazobactam (ZOSYN)  IV  3.375 g Intravenous Q8H  . saccharomyces boulardii  250 mg Oral BID  . sodium chloride  3 mL Intravenous Q12H  . vancomycin  750 mg Intravenous Q12H  . zolpidem  5 mg Oral QHS    Objective: Vital signs in last 24 hours: Temp:  [97 F (36.1 C)-98.1 F (36.7 C)] 98 F (36.7 C) (11/25 1545) Pulse Rate:  [71-117] 104 (11/25 1600) Resp:  [14-31] 21 (11/25 1600) BP: (97-132)/(46-73) 132/70 mmHg (11/25 1600) SpO2:  [89 %-98 %] 93 % (11/25 1600) Weight:  [132 lb 4.4 oz (60 kg)] 132 lb 4.4 oz (60 kg) (11/25 0600) General: alert, oriented, elderly male in NAD  Eyes: Anicteric sclerae.  ENT: Oropharynx clear. Dry mucous membranes. No thrush  Lymph: No cervical, supraclavicular, or axillary lymphadenopathy.  Heart: Normal  S1, S2. No murmurs, rubs, or gallops appreciated. No bruits, equal pulses.PVC's per monitor  Lungs: Right lung with few scattered crackles. Few  expiratory wheezes on R.  Abdomen: Abdomen soft, non-tender and not distended, normoactive bowel sounds.  Musculoskeletal: No clubbing or synovitis. 1 to 2 + LE edema.  Skin: No rashes or lesions  Neuro: No focal neurologic deficits.  Lab Results  Recent Labs  08/09/13 2125 08/10/13 0245  WBC 10.0 9.1  HGB 10.6* 9.7*  HCT 31.7* 29.8*  NA 134* 134*  K 4.1 3.7  CL 99 100  CO2 27 25  BUN 20 16  CREATININE 1.02 0.88   Liver Panel  Recent Labs  08/09/13 2125  PROT 6.4  ALBUMIN 2.5*  AST 23  ALT 25  ALKPHOS 69  BILITOT 0.2*   Sedimentation Rate No results found for this basename: ESRSEDRATE,  in the last 72 hours C-Reactive Protein No results found for this basename: CRP,  in the last 72 hours  Microbiology: 11/24 expectorant cx: few c.albicans  Studies/Results: Dg Chest Port 1 View  08/11/2013   CLINICAL DATA:  Pneumonia.  EXAM: PORTABLE CHEST - 1 VIEW  COMPARISON:  08/09/2013 and CT 08/07/2013  FINDINGS: PICC line tip is in the SVC region. Minimal change in the parenchymal disease in the right upper lung.  There continues to be aeration at the right apex. Lucency in the left lung consistent with emphysema. Heart and mediastinum are within normal limits. No evidence for a large pneumothorax. Elevated humeral heads probably represent underlying rotator cuff disease.  IMPRESSION: No significant change in the right upper lung pneumonia.   Electronically Signed   By: Richarda Overlie M.D.   On: 08/11/2013 07:31   Dg Chest Port 1 View  08/09/2013   CLINICAL DATA:  Pneumonia for 5 weeks with increasing shortness of breath and continued fever  EXAM: PORTABLE CHEST - 1 VIEW  COMPARISON:  08/07/2013  FINDINGS: Unchanged right upper lobe consolidation with areas of lucency within it suggesting necrosis. Right PICC line identified with tip to the  cavoatrial junction. Tiny right effusion. Left lung is clear.  IMPRESSION: No significant change from prior study. Right upper lobe necrotic pneumonia again identified.   Electronically Signed   By: Esperanza Heir M.D.   On: 08/09/2013 21:35     Assessment/Plan: 79 yo M with necrotizing pneumonia = continue with piptazo and vancomycin for now. We can devise easier regimen for outpatient, but will likely include vancomycin.  Duke Salvia Drue Second MD MPH [facility] [phone]  08/11/2013, 5:24 PM

## 2013-08-11 NOTE — Progress Notes (Signed)
Received call from patient's daughter, Augusto Gamble, who states they are interested in [facility] Care Management services. She reports they could use the additional support and resources. Agreed to meet with this Clinical research associate on tomorrow (08/12/13) around 1130am in patient's room to sign consents.  Raiford Noble, MSN- Ed, Charity fundraiser, BSN- [facility] [phone]

## 2013-08-11 NOTE — Progress Notes (Signed)
[facility] PROGRESS NOTE  John Black ZOX:[id number] DOB: Aug 01, 1929 DOA: 08/09/2013 PCP: Milinda Antis, MD  Assessment/Plan: Acute on chronic respiratory failure  -Improved now on home O2 regimen of 4LO2 - continue the vancomycin Zosyn and Cipro. Check sputum cultures. -Spoke w/ [facility] and they agree Pt needs to remain inpatient and continue IV ABx. Also think risk of Bronchoscopy to risky.  Necrotizing PNA; continue antibiotics per pulmonology -Continue patient on methylprednisolone 80 mg every 24hr secondary to wheezing  COPD  - continue nebulizer and Pulmicort. Continue [place], titrate maintain patient's SpO2 > 93%. - cont Flutter valve during waking hrs   Hypothyroidism  - TSH 2.5 (WNL)  High Troponin; - Pt had only one slightly high troponin, the repeat troponin was within normal limits. Most likely some demand ischemia secondary to patient's tachycardia. Patient's tachycardia improved with addition of Coreg    Code Status: DNR Family Communication:  Disposition Plan:    Consultants:  Pulmonary(DR Guinevere Ferrari)   Procedures: Sputum culture 11/23; pending  Blood culture 11/23; NGTD (Pending)  Urine Culture negative (Final)  MRSA PCR; Negative  PICC line placed right arm 08/08/2013  CT chest without contrast 08/07/2013 Increase in extent and severity of necrotic right upper lobe  pneumonia. There are now cavitary areas within the consolidative  process concerning for pulmonary abscess.   CXR 08/07/2013 Worsening right upper lobe pneumonitis as described above.  Emphysematous changes are also appreciated with air-fluid levels.  Antibiotics:  Ciprofloxacin 11/24>>  Zosyn 11/24>>  Vancomycin 11/24>>    HPI/Subjective: John Black is a 79 y.o. WM PMHX hyponatremia, thyroidism, emphysema (on 3 L O2 at home), acute on chronic respiratory failure, male who was recently diagnosed with necrotizing pneumonia has had a CAT  scan of his chest yesterday and was found to have worsening of his pneumonia with cavitations. Patient was started on vancomycin Zosyn and Cipro as outpatient after PICC line was placed by patient's pulmonologist. Despite which patient's breathing got worse and was advised to come to the ER. Patient usually uses 4 L of oxygen and presently needs to use 6 L to maintain saturation more than 90% and patient is also using accessory muscles to breathe. Chest x-ray does not show anything worsening. Patient in addition has some chest tightness. Patient's daughter states that earlier today patient had fever with temperatures around 101F. Patient otherwise denies any nausea vomiting abdominal pain diarrhea. 08/10/2013 states he was initially seen at [facility] x 2 over last couple of weeks but his breathing cont. To worsen. States Say his Pulmonologist last wk who admitted him to [facility] and placed on ABxand sent him hm w/ PICC line and ABX. After one day O2 requirement continue to Inc and was running fever of 100.6. That's when he decieded to return here last night.TODAY States feeling greatly improved. Now sitting in chair on 4LO2 via [place] which is his home regimen.         Objective: Filed Vitals:   08/11/13 0200 08/11/13 0400 08/11/13 0600 08/11/13 0807  BP: 122/59 113/53 100/46   Pulse: 80 75 71   Temp:  97.9 F (36.6 C)    TempSrc:  Oral    Resp: 27 14 15    Height:      Weight:   60 kg (132 lb 4.4 oz)   SpO2: 92% 98% 95% 95%    Intake/Output Summary (Last 24 hours) at 08/11/13 8119 Last data filed at 08/11/13 0626  Gross per 24 hour  Intake  560 ml  Output   1575 ml  Net  -1015 ml   Filed Weights   08/10/13 0115 08/10/13 0500 08/11/13 0600  Weight: 60.4 kg (133 lb 2.5 oz) 60.9 kg (134 lb 4.2 oz) 60 kg (132 lb 4.4 oz)    Exam:   General:  A/O x 4, NAD  Cardiovascular:  RRR (-) M/R/G, DP/PT pulse +2  Respiratory:  RUL/RML/RLL decreased air movement w/ Inspitory/Expitory  wheeze(improved fm yesterday). LUL Expitory wheeze. Remainder Left lung CTA  Abdomen: Soft , NT, ND, (+) BS  Musculoskeletal: Bilat (+) Pedal Edema 2+ to knee   Data Reviewed: Basic Metabolic Panel:  Recent Labs Lab 08/09/13 2125 08/10/13 0245  NA 134* 134*  K 4.1 3.7  CL 99 100  CO2 27 25  GLUCOSE 114* 124*  BUN 20 16  CREATININE 1.02 0.88  CALCIUM 8.9 8.3*   Liver Function Tests:  Recent Labs Lab 08/09/13 2125  AST 23  ALT 25  ALKPHOS 69  BILITOT 0.2*  PROT 6.4  ALBUMIN 2.5*   No results found for this basename: LIPASE, AMYLASE,  in the last 168 hours No results found for this basename: AMMONIA,  in the last 168 hours CBC:  Recent Labs Lab 08/09/13 2125 08/10/13 0245  WBC 10.0 9.1  NEUTROABS 7.3  --   HGB 10.6* 9.7*  HCT 31.7* 29.8*  MCV 82.3 83.0  PLT 431* 377   Cardiac Enzymes:  Recent Labs Lab 08/10/13 0245 08/10/13 1110 08/10/13 1325 08/10/13 2235  TROPONINI <0.30 <0.30 0.33* <0.30   BNP (last 3 results)  Recent Labs  07/24/13 2004 08/10/13 0245  PROBNP 335.2 888.5*   CBG: No results found for this basename: GLUCAP,  in the last 168 hours  Recent Results (from the past 240 hour(s))  URINE CULTURE     Status: None   Collection Time    08/09/13  9:14 PM      Result Value Range Status   Specimen Description URINE, CLEAN CATCH   Final   Special Requests NONE   Final   Culture  Setup Time     Final   Value: 08/10/2013 03:35     Performed at [facility] Count     Final   Value: NO GROWTH     Performed at [facility]   Culture     Final   Value: NO GROWTH     Performed at [facility]   Report Status 08/10/2013 FINAL   Final  CULTURE, BLOOD (ROUTINE X 2)     Status: None   Collection Time    08/09/13  9:25 PM      Result Value Range Status   Specimen Description BLOOD LEFT ANTECUBITAL   Final   Special Requests BOTTLES DRAWN AEROBIC AND ANAEROBIC 5CC   Final   Culture  Setup Time     Final    Value: 08/10/2013 02:53     Performed at [facility]   Culture     Final   Value:        BLOOD CULTURE RECEIVED NO GROWTH TO DATE CULTURE WILL BE HELD FOR 5 DAYS BEFORE ISSUING A FINAL NEGATIVE REPORT     Performed at [facility]   Report Status PENDING   Incomplete  CULTURE, BLOOD (ROUTINE X 2)     Status: None   Collection Time    08/09/13  9:25 PM      Result Value Range  Status   Specimen Description BLOOD RIGHT ANTECUBITAL   Final   Special Requests BOTTLES DRAWN AEROBIC AND ANAEROBIC 5CC   Final   Culture  Setup Time     Final   Value: 08/10/2013 02:53     Performed at [facility]   Culture     Final   Value:        BLOOD CULTURE RECEIVED NO GROWTH TO DATE CULTURE WILL BE HELD FOR 5 DAYS BEFORE ISSUING A FINAL NEGATIVE REPORT     Performed at [facility]   Report Status PENDING   Incomplete  MRSA PCR SCREENING     Status: None   Collection Time    08/10/13  1:58 AM      Result Value Range Status   MRSA by PCR NEGATIVE  NEGATIVE Final   Comment:            The GeneXpert MRSA Assay (FDA     approved for NASAL specimens     only), is one component of a     comprehensive MRSA colonization     surveillance program. It is not     intended to diagnose MRSA     infection nor to guide or     monitor treatment for     MRSA infections.  CULTURE, EXPECTORATED SPUTUM-ASSESSMENT     Status: None   Collection Time    08/10/13  4:27 AM      Result Value Range Status   Specimen Description SPUTUM   Final   Special Requests Normal   Final   Sputum evaluation     Final   Value: THIS SPECIMEN IS ACCEPTABLE. RESPIRATORY CULTURE REPORT TO FOLLOW.   Report Status 08/10/2013 FINAL   Final     Studies: Dg Chest Port 1 View  08/11/2013   CLINICAL DATA:  Pneumonia.  EXAM: PORTABLE CHEST - 1 VIEW  COMPARISON:  08/09/2013 and CT 08/07/2013  FINDINGS: PICC line tip is in the SVC region. Minimal change in the parenchymal disease in the right upper lung.  There continues to be aeration at the right apex. Lucency in the left lung consistent with emphysema. Heart and mediastinum are within normal limits. No evidence for a large pneumothorax. Elevated humeral heads probably represent underlying rotator cuff disease.  IMPRESSION: No significant change in the right upper lung pneumonia.   Electronically Signed   By: Richarda Overlie M.D.   On: 08/11/2013 07:31   Dg Chest Port 1 View  08/09/2013   CLINICAL DATA:  Pneumonia for 5 weeks with increasing shortness of breath and continued fever  EXAM: PORTABLE CHEST - 1 VIEW  COMPARISON:  08/07/2013  FINDINGS: Unchanged right upper lobe consolidation with areas of lucency within it suggesting necrosis. Right PICC line identified with tip to the cavoatrial junction. Tiny right effusion. Left lung is clear.  IMPRESSION: No significant change from prior study. Right upper lobe necrotic pneumonia again identified.   Electronically Signed   By: Esperanza Heir M.D.   On: 08/09/2013 21:35    Scheduled Meds: . budesonide  0.25 mg Nebulization Q6H  . carvedilol  3.125 mg Oral BID WC  . doxazosin  8 mg Oral QHS  . enoxaparin (LOVENOX) injection  40 mg Subcutaneous Q24H  . ipratropium  0.5 mg Nebulization Q6H  . levalbuterol  0.63 mg Nebulization Q6H  . levothyroxine  75 mcg Oral QAC breakfast  . methylPREDNISolone (SOLU-MEDROL) injection  40 mg Intravenous Daily  . multivitamin with  minerals  1 tablet Oral q morning - 10a  . oxyCODONE-acetaminophen  1 tablet Oral QHS  . pantoprazole  40 mg Oral Daily  . piperacillin-tazobactam (ZOSYN)  IV  3.375 g Intravenous Q8H  . sodium chloride  3 mL Intravenous Q12H  . vancomycin  750 mg Intravenous Q12H  . zolpidem  5 mg Oral QHS   Continuous Infusions:   Active Problems:   PROSTATE CANCER   COPD (chronic obstructive pulmonary disease) with emphysema   Hypothyroidism   Acute-on-chronic respiratory failure   Hyponatremia   HCAP (healthcare-associated pneumonia)/ Probable  necrotizing PNA    Time spent:   Karalyne Nusser, J  [facility] Pager [phone]. If 7PM-7AM, please contact night-coverage at [email or web address], password [facility] 08/11/2013, 9:07 AM  LOS: 2 days

## 2013-08-11 NOTE — Progress Notes (Signed)
PULMONARY  / CRITICAL CARE MEDICINE  Name: John Black [name]: [id number] DOB: [birth year]    ADMISSION DATE:  08/09/2013 CONSULTATION DATE:  08/09/13  REFERRING MD :  Midge Minium PRIMARY SERVICE: [facility]  CHIEF COMPLAINT:   SOB, necrotizing pneumonia  BRIEF PATIENT DESCRIPTION:  79 y/o male with PMH relevant for severe COPD on home oxygen, prostate cancer and hypothyroidism. Recently hospitalized with a RUL necrotizing pneumonia. Sent home with PICC line with abx. Now back in the hospital with increased SOB and low grade fever. Critical care consulted for further recommendations.  SIGNIFICANT EVENTS / STUDIES:  11/21 - Chest X ray: RUL necrotizing pneumonia. 11/23 - Re-Admit, reported dyspnea at rest 11/24 - improved dyspnea, desats with activity to mid 80's  LINES / TUBES: 10/25 PICC line>>>  CULTURES: BCx2 11/23>>> UC 11/23>>>No growth Sputum 11/23>>>  ANTIBIOTICS: Zosyn 11/23>>> Ciprofloxacin 11/23>>>11/24 Vancomycin 11/23>>>  SUBJECTIVE: Pt reports improved respiratory status, less dyspnea. Feels better than yesterday. VITAL SIGNS: Temp:  [97.8 F (36.6 C)-98.7 F (37.1 C)] 97.9 F (36.6 C) (11/25 0400) Pulse Rate:  [71-117] 71 (11/25 0600) Resp:  [14-27] 15 (11/25 0600) BP: (100-122)/(46-60) 100/46 mmHg (11/25 0600) SpO2:  [91 %-98 %] 95 % (11/25 0807) Weight:  [132 lb 4.4 oz (60 kg)] 132 lb 4.4 oz (60 kg) (11/25 0600)  HEMODYNAMICS:    VENTILATOR SETTINGS:    INTAKE / OUTPUT: Intake/Output     11/24 0701 - 11/25 0700 11/25 0701 - 11/26 0700   I.V. (mL/kg) 110 (1.8)    Other     IV Piggyback 450    Total Intake(mL/kg) 560 (9.3)    Urine (mL/kg/hr) 1775 (1.2)    Total Output 1775     Net -1215          Urine Occurrence 2 x    Stool Occurrence 2 x      PHYSICAL EXAMINATION: General: alert, oriented, elderly male in NAD Eyes: Anicteric sclerae. ENT: Oropharynx clear. Dry mucous membranes. No thrush Lymph: No cervical,  supraclavicular, or axillary lymphadenopathy. Heart: Normal S1, S2. No murmurs, rubs, or gallops appreciated. No bruits, equal pulses.PVC's per monitor Lungs: Right lung with few scattered crackles.  Few sibilant expiratory wheezes on R. Abdomen: Abdomen soft, non-tender and not distended, normoactive bowel sounds.  Musculoskeletal: No clubbing or synovitis. 1 to 2 + LE edema. Skin: No rashes or lesions Neuro: No focal neurologic deficits.  LABS:  CBC  Recent Labs Lab 08/09/13 2125 08/10/13 0245  WBC 10.0 9.1  HGB 10.6* 9.7*  HCT 31.7* 29.8*  PLT 431* 377   Coag's No results found for this basename: APTT, INR,  in the last 168 hours BMET  Recent Labs Lab 08/09/13 2125 08/10/13 0245  NA 134* 134*  K 4.1 3.7  CL 99 100  CO2 27 25  BUN 20 16  CREATININE 1.02 0.88  GLUCOSE 114* 124*   Electrolytes  Recent Labs Lab 08/09/13 2125 08/10/13 0245  CALCIUM 8.9 8.3*   Sepsis Markers  Recent Labs Lab 08/09/13 2156  LATICACIDVEN 0.70   ABG No results found for this basename: PHART, PCO2ART, PO2ART,  in the last 168 hours Liver Enzymes  Recent Labs Lab 08/09/13 2125  AST 23  ALT 25  ALKPHOS 69  BILITOT 0.2*  ALBUMIN 2.5*   Cardiac Enzymes  Recent Labs Lab 08/10/13 0245 08/10/13 1110 08/10/13 1325 08/10/13 2235  TROPONINI <0.30 <0.30 0.33* <0.30  PROBNP 888.5*  --   --   --    Glucose  No results found for this basename: GLUCAP,  in the last 168 hours  Imaging Dg Chest Port 1 View  08/11/2013   CLINICAL DATA:  Pneumonia.  EXAM: PORTABLE CHEST - 1 VIEW  COMPARISON:  08/09/2013 and CT 08/07/2013  FINDINGS: PICC line tip is in the SVC region. Minimal change in the parenchymal disease in the right upper lung. There continues to be aeration at the right apex. Lucency in the left lung consistent with emphysema. Heart and mediastinum are within normal limits. No evidence for a large pneumothorax. Elevated humeral heads probably represent underlying rotator  cuff disease.  IMPRESSION: No significant change in the right upper lung pneumonia.   Electronically Signed   By: Richarda Overlie M.D.   On: 08/11/2013 07:31   Dg Chest Port 1 View  08/09/2013   CLINICAL DATA:  Pneumonia for 5 weeks with increasing shortness of breath and continued fever  EXAM: PORTABLE CHEST - 1 VIEW  COMPARISON:  08/07/2013  FINDINGS: Unchanged right upper lobe consolidation with areas of lucency within it suggesting necrosis. Right PICC line identified with tip to the cavoatrial junction. Tiny right effusion. Left lung is clear.  IMPRESSION: No significant change from prior study. Right upper lobe necrotic pneumonia again identified.   Electronically Signed   By: Esperanza Heir M.D.   On: 08/09/2013 21:35    ASSESSMENT / PLAN:  PULMONARY A: RUL necrotizing pneumonia - Chest X ray with increased density of RUL consolidation. Improvement of RLL infiltrates previously seen on X ray from 11/7. No elevated WBC, no fever since admission. Only 100.4 at home. Not certain this is real antibiotic failure. Given relative stability, more inclined to continue same antibiotics for now, get culture data and change antibiotics only based on cultures.  P:   - Continue Zosyn, vancomycin. Appreciate ID's assistance. - Continue Pulmicort and duonebs. - Reduce IV steroids, concern for delirium in elderly.  Mild bronchospasm on exam.   - Continue Chest PT, incentive spirometry. - Supplemental oxygen to keep O2 sat >92%. -Increased activity as tolerated. - Will follow sputum cultures, and narrow antibiotic coverage when able. - Appreciate ID input and recommendations as cultures result. - pcxr in am , appears to have some improved aeration -Check Mag and Phos 11/26.  Code status: - DNR / DNI  Scribed by Kandice Robinsons, RN ACNP-Student [employer] for Canary Brim, NP-C  08/11/2013, 9:12 AM  I have fully examined this patient and agree with above findings.    And edited in full  Mcarthur Rossetti.  Tyson Alias, MD, FACP Pgr: [phone] [place] [facility]

## 2013-08-11 NOTE — Progress Notes (Signed)
Received referral from COPD [facility] program. Patient was previously engaged by [facility] telephonically to see if he was interested in [facility] Care Management services. At that time, prior to admission, patient and daughter declined [facility] Care Management services. Came to bedside to offer [facility] Care Management services again since patient has been readmitted. Patient states his daughter, Jones Bales, is the one to speak with regarding [facility] Care Management. He states she went out for lunch and should be back later. He requested that this writer leave contact information for daughter to call. Left contact information and [facility] Care Management brochure at bedside. Will follow up at later time when daughter available.  Raiford Noble, MSN- Ed, RN,BSN- [facility] [phone]

## 2013-08-12 ENCOUNTER — Inpatient Hospital Stay (HOSPITAL_COMMUNITY): Payer: Medicare Other

## 2013-08-12 DIAGNOSIS — D72829 Elevated white blood cell count, unspecified: Secondary | ICD-10-CM

## 2013-08-12 LAB — MAGNESIUM: Magnesium: 2.2 mg/dL (ref 1.5–2.5)

## 2013-08-12 LAB — CBC
HCT: 30.9 % — ABNORMAL LOW (ref 39.0–52.0)
Hemoglobin: 10.2 g/dL — ABNORMAL LOW (ref 13.0–17.0)
MCH: 27.4 pg (ref 26.0–34.0)
MCHC: 33 g/dL (ref 30.0–36.0)
MCV: 83.1 fL (ref 78.0–100.0)
Platelets: 462 10*3/uL — ABNORMAL HIGH (ref 150–400)
RBC: 3.72 MIL/uL — ABNORMAL LOW (ref 4.22–5.81)
RDW: 14.2 % (ref 11.5–15.5)
WBC: 9.5 10*3/uL (ref 4.0–10.5)

## 2013-08-12 LAB — BASIC METABOLIC PANEL
BUN: 19 mg/dL (ref 6–23)
CO2: 29 mEq/L (ref 19–32)
Calcium: 9 mg/dL (ref 8.4–10.5)
Chloride: 97 mEq/L (ref 96–112)
Creatinine, Ser: 0.84 mg/dL (ref 0.50–1.35)
GFR calc Af Amer: >90 mL/min (ref >90)
GFR calc non Af Amer: 79 mL/min — ABNORMAL LOW (ref >90)
Glucose, Bld: 116 mg/dL — ABNORMAL HIGH (ref 70–99)
Potassium: 4.2 mEq/L (ref 3.5–5.1)
Sodium: 135 mEq/L (ref 135–145)

## 2013-08-12 LAB — CULTURE, RESPIRATORY W GRAM STAIN

## 2013-08-12 LAB — PHOSPHORUS: Phosphorus: 2.5 mg/dL (ref 2.3–4.6)

## 2013-08-12 LAB — VANCOMYCIN, TROUGH: Vancomycin Tr: 11.9 ug/mL (ref 10.0–20.0)

## 2013-08-12 IMAGING — CR DG CHEST 1V PORT
1 series · 1 of 1 positions shown · non-contrast
Comparison: [DATE].

CLINICAL DATA: Pneumonia.

EXAM:
PORTABLE CHEST - 1 VIEW

[AP]
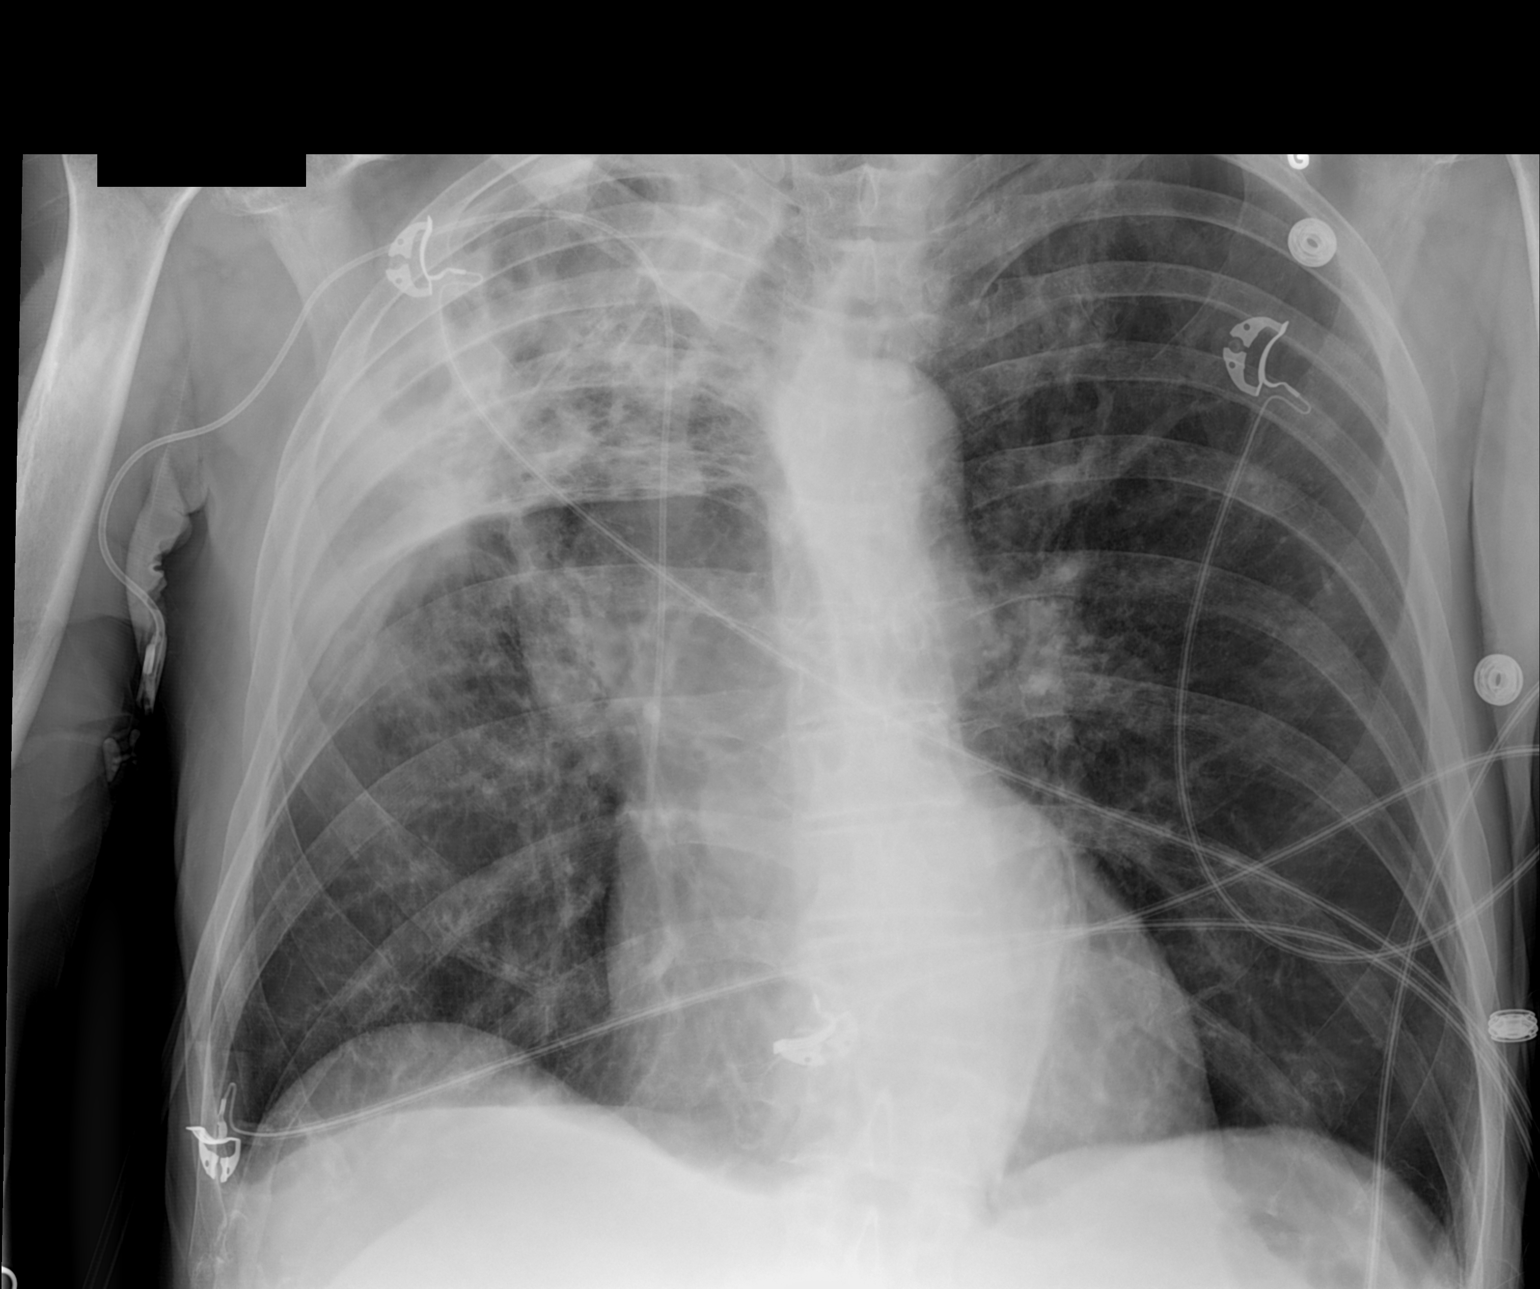

[1 of 1 positions shown; findings below may reference images not displayed]

FINDINGS: PICC line in stable position. Persistent right upper lobe
atelectasis and infiltrate present. Tiny right pleural effusion.
Left lung is clear. No pneumothorax. Cardiovascular structures are
stable. No acute osseous abnormality.
IMPRESSION: 1. Persistent atelectasis and infiltrate right upper lobe.
2. PICC line in good anatomic position.

## 2013-08-12 MED ORDER — VANCOMYCIN HCL IN DEXTROSE 1-5 GM/200ML-% IV SOLN
1000.0000 mg | Freq: Two times a day (BID) | INTRAVENOUS | Status: DC
  Administered 2013-08-12 – 2013-08-17 (×10): 1000 mg via INTRAVENOUS
  Filled 2013-08-12 (×11): qty 200

## 2013-08-12 MED ORDER — ZOLPIDEM TARTRATE 5 MG PO TABS
5.0000 mg | ORAL_TABLET | Freq: Once | ORAL | Status: AC
  Administered 2013-08-12: 5 mg via ORAL
  Filled 2013-08-12: qty 1

## 2013-08-12 MED ORDER — POTASSIUM CHLORIDE 20 MEQ/15ML (10%) PO LIQD
40.0000 meq | Freq: Once | ORAL | Status: AC
  Administered 2013-08-12: 40 meq via ORAL
  Filled 2013-08-12: qty 30

## 2013-08-12 MED ORDER — FUROSEMIDE 10 MG/ML IJ SOLN
40.0000 mg | Freq: Once | INTRAMUSCULAR | Status: AC
  Administered 2013-08-12: 40 mg via INTRAVENOUS
  Filled 2013-08-12: qty 4

## 2013-08-12 MED ORDER — SODIUM CHLORIDE 0.9 % IV SOLN
INTRAVENOUS | Status: DC
  Administered 2013-08-12: 500 mL via INTRAVENOUS

## 2013-08-12 MED ORDER — PREDNISONE 20 MG PO TABS
30.0000 mg | ORAL_TABLET | Freq: Every day | ORAL | Status: DC
  Administered 2013-08-12 – 2013-08-14 (×3): 30 mg via ORAL
  Filled 2013-08-12 (×4): qty 1

## 2013-08-12 MED ORDER — PREDNISONE 20 MG PO TABS: 30.0000 mg | ORAL_TABLET | Freq: Every day | ORAL | Status: DC

## 2013-08-12 NOTE — Progress Notes (Signed)
PULMONARY  / CRITICAL CARE MEDICINE  Name: John Black [name]: [id number] DOB: [birth year]    ADMISSION DATE:  08/09/2013 CONSULTATION DATE:  08/09/13  REFERRING MD :  Midge Minium PRIMARY SERVICE: [facility]  CHIEF COMPLAINT:  SOB, necrotizing pneumonia  BRIEF PATIENT DESCRIPTION: 79 y/o male with PMH relevant for severe COPD on home oxygen, prostate cancer and hypothyroidism. Recently hospitalized with a RUL necrotizing pneumonia. Sent home with PICC line with abx. Now back in the hospital with increased SOB and low grade fever. Critical care consulted for further recommendations.  SIGNIFICANT EVENTS / STUDIES:  11/21 - Chest X ray: RUL necrotizing pneumonia. 11/23 - Re-Admit, reported dyspnea at rest 11/24 - improved dyspnea, desats with activity to mid 80's 11/26 - improved aeration on CXR, improved saturations  LINES / TUBES: 10/25 PICC line>>>  CULTURES: BCx2 11/23>>> UC 11/23>>>No growth Sputum 11/23>>>neg  ANTIBIOTICS: Zosyn 11/23>>> Ciprofloxacin 11/23>>>11/24 Vancomycin 11/23>>>  SUBJECTIVE: Pt notes he feels better than days prior.  Improved sats.  Minimal sputum production.  Feet swelling.   VITAL SIGNS: Temp:  [97.4 F (36.3 C)-98.1 F (36.7 C)] 97.4 F (36.3 C) (11/26 0600) Pulse Rate:  [67-124] 124 (11/26 0808) Resp:  [13-31] 24 (11/26 0750) BP: (97-159)/(53-107) 107/68 mmHg (11/26 0808) SpO2:  [88 %-97 %] 95 % (11/26 0750)  INTAKE / OUTPUT: Intake/Output     11/25 0701 - 11/26 0700 11/26 0701 - 11/27 0700   I.V. (mL/kg)     Other 360    IV Piggyback 450    Total Intake(mL/kg) 810 (13.5)    Urine (mL/kg/hr) 1100 (0.8) 300 (1.9)   Stool 2 (0)    Total Output 1102 300   Net -292 -300        Urine Occurrence 3 x      PHYSICAL EXAMINATION: General: alert, oriented, elderly male in NAD ENT: Oropharynx clear. Dry mucous membranes. No thrush Lymph: No cervical, supraclavicular, or axillary lymphadenopathy. Heart: Normal S1, S2. No  murmurs, rubs, or gallops appreciated. No bruits, equal pulses. PVC's per monitor Lungs: resp's even/non-labored, RUL with few rhonchi, otherwise clear Abdomen: Abdomen soft, non-tender and not distended, normoactive bowel sounds.  Musculoskeletal: No clubbing or synovitis. 1 to 2 + LE edema. Skin: No rashes or lesions Neuro: No focal neurologic deficits.  LABS:  CBC  Recent Labs Lab 08/09/13 2125 08/10/13 0245 08/12/13 0335  WBC 10.0 9.1 9.5  HGB 10.6* 9.7* 10.2*  HCT 31.7* 29.8* 30.9*  PLT 431* 377 462*   Coag's No results found for this basename: APTT, INR,  in the last 168 hours BMET  Recent Labs Lab 08/09/13 2125 08/10/13 0245 08/12/13 0335  NA 134* 134* 135  K 4.1 3.7 4.2  CL 99 100 97  CO2 27 25 29   BUN 20 16 19   CREATININE 1.02 0.88 0.84  GLUCOSE 114* 124* 116*   Electrolytes  Recent Labs Lab 08/09/13 2125 08/10/13 0245 08/12/13 0335  CALCIUM 8.9 8.3* 9.0  MG  --   --  2.2  PHOS  --   --  2.5   Sepsis Markers  Recent Labs Lab 08/09/13 2156  LATICACIDVEN 0.70   Liver Enzymes  Recent Labs Lab 08/09/13 2125  AST 23  ALT 25  ALKPHOS 69  BILITOT 0.2*  ALBUMIN 2.5*   Cardiac Enzymes  Recent Labs Lab 08/10/13 0245 08/10/13 1110 08/10/13 1325 08/10/13 2235  TROPONINI <0.30 <0.30 0.33* <0.30  PROBNP 888.5*  --   --   --    Glucose  No results found for this basename: GLUCAP,  in the last 168 hours  Imaging Dg Chest Port 1 View  08/12/2013   CLINICAL DATA:  Pneumonia.  EXAM: PORTABLE CHEST - 1 VIEW  COMPARISON:  08/11/2013.  FINDINGS: PICC line in stable position. Persistent right upper lobe atelectasis and infiltrate present. Tiny right pleural effusion. Left lung is clear. No pneumothorax. Cardiovascular structures are stable. No acute osseous abnormality.  IMPRESSION: 1. Persistent atelectasis and infiltrate right upper lobe. 2. PICC line in good anatomic position.   Electronically Signed   By: Maisie Fus  Register   On: 08/12/2013 07:05    Dg Chest Port 1 View  08/11/2013   CLINICAL DATA:  Pneumonia.  EXAM: PORTABLE CHEST - 1 VIEW  COMPARISON:  08/09/2013 and CT 08/07/2013  FINDINGS: PICC line tip is in the SVC region. Minimal change in the parenchymal disease in the right upper lung. There continues to be aeration at the right apex. Lucency in the left lung consistent with emphysema. Heart and mediastinum are within normal limits. No evidence for a large pneumothorax. Elevated humeral heads probably represent underlying rotator cuff disease.  IMPRESSION: No significant change in the right upper lung pneumonia.   Electronically Signed   By: Richarda Overlie M.D.   On: 08/11/2013 07:31    ASSESSMENT / PLAN:  PULMONARY A: RUL necrotizing pneumonia - Chest X ray with increased density of RUL consolidation on admit. Improvement of RLL infiltrates previously seen on X ray from 11/7. No elevated WBC, no fever since admission. Only 100.4 at home. Not certain this is real antibiotic failure. CXR has improved aeration on 11/26 with improved clinical status.    P:   - Continue Zosyn, vancomycin. Appreciate ID's assistance, has certainly made  - Continue Pulmicort and duonebs. - Transition to oral pred with quick taper to off - lasix x1  - Continue Chest PT, incentive spirometry. - Supplemental oxygen to keep O2 sat >92%. - Increased activity as tolerated. - Will follow sputum cultures, and narrow antibiotic coverage when able. - Hold CXR unitl 11/28 - f/u BMP in am with lasix - PT eval    Code status: - DNR / DNI   Patient is clinically & radiographically improved 11/26.  Recommend transfer to medical telemetry.  PCCM will see again on 11/28.  Please call sooner if needs arise.    Canary Brim, NP-C [place] [facility] Pgr: [phone] or [phone]  08/12/2013, 9:37 AM  D/w Dr Lennox Grumbles. Tyson Alias, MD, FACP Pgr: [phone] [place] [facility]

## 2013-08-12 NOTE — Progress Notes (Signed)
Met with patient, daughter, and patient's wife at bedside to explain [facility] Care Management services. They express their frustration in having to be readmitted to hospital so often. Augusto Gamble (daughter) and Mrs Bogart (wife) express that if patient has to have IV antibiotics again at discharge that they prefer for patient to go to ST-SNF. They express that it was "alot" on them to manage at home and Augusto Gamble (daughter) also reports they are fearful patient will fall at home and get hurt. He is currently active with [facility] services. Consents were signed for [facility] Care Management and explained that [facility] Care Management will not interfere with home health services but will provide an additional support and resource. Explained that if Mr Dino does indeed go to SNF at discharge, Augusto Gamble can call this writer to make aware of discharge to home and then [facility] Care Management will follow up. Made inpatient RNCM and LCSW aware of family's interest in possible SNF placement at discharge. Provided family with SNF list as they are interested in facilities in [facility], specifically in [place]. They report they live close to [facility]. Made them aware that the inpatient LCSW will follow up at later date and time. They also gave verbal permission for patient's information to be faxed out to facilities in [facility]. Left [facility] Care Management packet and contact information. Will continue to follow. Raiford Noble, MSN-Ed, RN,BSN- [facility] [phone]

## 2013-08-12 NOTE — Progress Notes (Addendum)
Clinical Social Work Department BRIEF PSYCHOSOCIAL ASSESSMENT 08/12/2013  Patient:  John Black, John Black     Account Number:  [id number]     Admit date:  08/09/2013  Clinical Social Worker:  Jacelyn Grip  Date/Time:  08/12/2013 03:30 PM  Referred by:  Care Management  Date Referred:  08/12/2013 Referred for  SNF Placement   Other Referral:   Interview type:  Other - See comment Other interview type:   conversation with [facility] Care Management case manager, John Black    PSYCHOSOCIAL DATA Living Status:  WIFE Admitted from facility:   Level of care:   Primary support name:  John Black/[phone] Primary support relationship to patient:  SPOUSE Degree of support available:   strong    CURRENT CONCERNS Current Concerns  Post-Acute Placement   Other Concerns:    SOCIAL WORK ASSESSMENT / PLAN CSW received phone call from [facility] Care management case manager, John Black. [facility] case manager was speaking with the pt and pt family about [facility] Care Management Services and the pt and pt family expressed interest in ST-SNF placement as pt had recently been in the hospital and then was re-admitted soon after discharge. Pt family concerned about pt at home given pt oxygen needs and need for IV abx at discharge. However, awaiting PT/OT evaluations.    [facility] Case Manager obtained verbal consent from the pt and pt family while CSW was on the phone for initiation of SNF search to [facility]. Appreciate [facility] case manager providing pt and pt family [facility] list.    CSW completed FL2 and initiated SNF search to [facility].    CSW to follow up with pt and pt family re: SNF bed offers.    CSW to continue to follow and facilitate pt discharge needs when pt medically ready for discharge.   Assessment/plan status:  Psychosocial Support/Ongoing Assessment of Needs Other assessment/ plan:   discharge planning   Information/referral to community resources:   [facility] list    PATIENT'S/FAMILY'S RESPONSE TO PLAN OF CARE: Communication through [facility] Case Manager via telephone and with pt family via telephone. Pt family expressed appreciation of initiating SNF search as they are very concerned about pt returning home.    John Black, MSW, LCSWA  Clinical Social Work [phone]

## 2013-08-12 NOTE — Progress Notes (Signed)
ANTIBIOTIC CONSULT NOTE - Follow up  Pharmacy Consult for Vancomycin, Zosyn Indication: Necrotizing Pneumonia   Labs:  Recent Labs  08/09/13 2125 08/10/13 0245 08/12/13 0335  WBC 10.0 9.1 9.5  HGB 10.6* 9.7* 10.2*  PLT 431* 377 462*  CREATININE 1.02 0.88 0.84   Estimated Creatinine Clearance: 57.5 ml/min (by C-G formula based on Cr of 0.84).  Recent Labs  08/12/13 0335  VANCOTROUGH 11.9      Assessment: 79 yo M with complaint of SOB, fever x 1 week and chills. Dx with necrotizing pna, PICC placed, and started home therapy with Vanc, Zosyn, Cipro on 11/23. Breathing got worse so he was advised to come to the ED.  11/23 >> Cipro >> 11/24 (DC'd by ID) 11/23 >> Vanc >> 11/23 >> Zosyn >>    Day #4 Vancomycin 750 mg IV q12h and Zosyn 3.375g IV q8h (4 hour infusion time).  Tmax: AF  WBCs: wnl  Renal: wnl, 56 ml/min (CG), 65 ml/min (normalized)  Vancomycin trough drawn early and below goal.  11/23 blood x 2: no growth to date 11/23 urine: no growth, final 11/24 sputum: rare candida albicans 11/24 MRSA PCR: neg  Goal of Therapy:  Vancomycin trough level 15-20 mcg/ml  Plan:  1.  Increase vancomycin to 1g IV q12h. 2.  Continue Zosyn 3.375g IV q8h (4 hour infusion time).   John Black, PharmD, BCPS Pager: [phone] 08/12/2013 7:19 AM

## 2013-08-12 NOTE — Progress Notes (Signed)
[facility] PROGRESS NOTE  John Black MVH:[id number] DOB: [birth year] DOA: 08/09/2013 PCP: Milinda Antis, MD  Assessment/Plan: Acute on chronic respiratory failure  -Improved now on O2 currently at 5LO2 (Home regimen 4LO2) - continue the vancomycin Zosyn and Cipro. Sputum cultures (rare Candida albicans). -Will perform ambulatory SpO2 if patient does not drop his SpO2 significantly will consider transferring out of unit.  Necrotizing PNA;  -continue antibiotics per pulmonology -Continue patient on methylprednisolone 40 mg every 24hr secondary to wheezing (date 3 of steroids)  COPD  - continue nebulizer and Pulmicort. Continue [place], titrate maintain patient's SpO2 > 93%. - cont Flutter valve during waking hrs   Hypothyroidism  - TSH 2.5 (WNL)  High Troponin; - Pt had only one slightly high troponin, the repeat troponin was within normal limits. Most likely some demand ischemia secondary to patient's tachycardia. Patient's tachycardia improved with addition of Coreg  Tachycardia -Improved with addition of Coreg, however with any sort of exertion heart rate will increase into the 120s to 130s. Secondary to patient's BP unable to increase beta blocker  Pedal edema -TED hose when awake; should that with patient's pedal edema.   Code Status: DNR Family Communication:  Disposition Plan:    Consultants:  Pulmonary(DR Guinevere Ferrari)   Procedures: Sputum culture 11/24; rare Candida albicans, (pending)  Blood culture 11/23; NGTD (Pending)  Urine Culture negative (Final)  MRSA PCR; Negative  PICC line placed right arm 08/08/2013  CT chest without contrast 08/07/2013 Increase in extent and severity of necrotic right upper lobe  pneumonia. There are now cavitary areas within the consolidative  process concerning for pulmonary abscess.   CXR 08/07/2013 Worsening right upper lobe pneumonitis as described above.  Emphysematous changes are also appreciated with  air-fluid levels.  Antibiotics:  Ciprofloxacin 11/24>>  Zosyn 11/24>>  Vancomycin 11/24>>    HPI/Subjective: John Black is a 79 y.o. WM PMHX hyponatremia, thyroidism, emphysema (on 3 L O2 at home), acute on chronic respiratory failure, male who was recently diagnosed with necrotizing pneumonia has had a CAT scan of his chest yesterday and was found to have worsening of his pneumonia with cavitations. Patient was started on vancomycin Zosyn and Cipro as outpatient after PICC line was placed by patient's pulmonologist. Despite which patient's breathing got worse and was advised to come to the ER. Patient usually uses 4 L of oxygen and presently needs to use 6 L to maintain saturation more than 90% and patient is also using accessory muscles to breathe. Chest x-ray does not show anything worsening. Patient in addition has some chest tightness. Patient's daughter states that earlier today patient had fever with temperatures around 101F. Patient otherwise denies any nausea vomiting abdominal pain diarrhea. 08/10/2013 states he was initially seen at [facility] x 2 over last couple of weeks but his breathing cont. To worsen. States Say his Pulmonologist last wk who admitted him to [facility] and placed on ABxand sent him hm w/ PICC line and ABX. After one day O2 requirement continue to Inc and was running fever of 100.6. That's when he decieded to return here last night.08/11/2013 States feeling greatly improved. Now sitting in chair on 4LO2 via [place] which is his home regimen. TODAY states feeling improved like to know if he can be transferred out of the unit . Negative CP, positive continue SOB when not on nasal cannula. Unsure if he is DOE secondary to not having exerted himself other than moving from bed to chair to bathroom.  Objective: Filed Vitals:   08/12/13 0300 08/12/13 0400 08/12/13 0500 08/12/13 0600  BP:    124/64  Pulse: 71 67 75 93  Temp:    97.4 F (36.3 C)  TempSrc:     Axillary  Resp: 14 13 14 16   Height:      Weight:      SpO2: 91% 97% 96% 96%    Intake/Output Summary (Last 24 hours) at 08/12/13 0737 Last data filed at 08/12/13 0539  Gross per 24 hour  Intake    810 ml  Output   1102 ml  Net   -292 ml   Filed Weights   08/10/13 0115 08/10/13 0500 08/11/13 0600  Weight: 60.4 kg (133 lb 2.5 oz) 60.9 kg (134 lb 4.2 oz) 60 kg (132 lb 4.4 oz)    Exam:   General:  A/O x 4, NAD  Cardiovascular:  RRR (-) M/R/G, DP/PT pulse +2  Respiratory:  RUL/RML/RLL decreased air movement (improved from yesterday) negative wheeze. Left lung clear to auscultation   Abdomen: Soft , NT, ND, (+) BS  Musculoskeletal: Bilat (+) Pedal Edema 2+ to knee   Data Reviewed: Basic Metabolic Panel:  Recent Labs Lab 08/09/13 2125 08/10/13 0245 08/12/13 0335  NA 134* 134* 135  K 4.1 3.7 4.2  CL 99 100 97  CO2 27 25 29   GLUCOSE 114* 124* 116*  BUN 20 16 19   CREATININE 1.02 0.88 0.84  CALCIUM 8.9 8.3* 9.0  MG  --   --  2.2  PHOS  --   --  2.5   Liver Function Tests:  Recent Labs Lab 08/09/13 2125  AST 23  ALT 25  ALKPHOS 69  BILITOT 0.2*  PROT 6.4  ALBUMIN 2.5*   No results found for this basename: LIPASE, AMYLASE,  in the last 168 hours No results found for this basename: AMMONIA,  in the last 168 hours CBC:  Recent Labs Lab 08/09/13 2125 08/10/13 0245 08/12/13 0335  WBC 10.0 9.1 9.5  NEUTROABS 7.3  --   --   HGB 10.6* 9.7* 10.2*  HCT 31.7* 29.8* 30.9*  MCV 82.3 83.0 83.1  PLT 431* 377 462*   Cardiac Enzymes:  Recent Labs Lab 08/10/13 0245 08/10/13 1110 08/10/13 1325 08/10/13 2235  TROPONINI <0.30 <0.30 0.33* <0.30   BNP (last 3 results)  Recent Labs  07/24/13 2004 08/10/13 0245  PROBNP 335.2 888.5*   CBG: No results found for this basename: GLUCAP,  in the last 168 hours  Recent Results (from the past 240 hour(s))  URINE CULTURE     Status: None   Collection Time    08/09/13  9:14 PM      Result Value Range Status    Specimen Description URINE, CLEAN CATCH   Final   Special Requests NONE   Final   Culture  Setup Time     Final   Value: 08/10/2013 03:35     Performed at [facility] Count     Final   Value: NO GROWTH     Performed at [facility]   Culture     Final   Value: NO GROWTH     Performed at [facility]   Report Status 08/10/2013 FINAL   Final  CULTURE, BLOOD (ROUTINE X 2)     Status: None   Collection Time    08/09/13  9:25 PM      Result Value Range Status   Specimen Description BLOOD  LEFT ANTECUBITAL   Final   Special Requests BOTTLES DRAWN AEROBIC AND ANAEROBIC 5CC   Final   Culture  Setup Time     Final   Value: 08/10/2013 02:53     Performed at [facility]   Culture     Final   Value:        BLOOD CULTURE RECEIVED NO GROWTH TO DATE CULTURE WILL BE HELD FOR 5 DAYS BEFORE ISSUING A FINAL NEGATIVE REPORT     Performed at [facility]   Report Status PENDING   Incomplete  CULTURE, BLOOD (ROUTINE X 2)     Status: None   Collection Time    08/09/13  9:25 PM      Result Value Range Status   Specimen Description BLOOD RIGHT ANTECUBITAL   Final   Special Requests BOTTLES DRAWN AEROBIC AND ANAEROBIC 5CC   Final   Culture  Setup Time     Final   Value: 08/10/2013 02:53     Performed at [facility]   Culture     Final   Value:        BLOOD CULTURE RECEIVED NO GROWTH TO DATE CULTURE WILL BE HELD FOR 5 DAYS BEFORE ISSUING A FINAL NEGATIVE REPORT     Performed at [facility]   Report Status PENDING   Incomplete  MRSA PCR SCREENING     Status: None   Collection Time    08/10/13  1:58 AM      Result Value Range Status   MRSA by PCR NEGATIVE  NEGATIVE Final   Comment:            The GeneXpert MRSA Assay (FDA     approved for NASAL specimens     only), is one component of a     comprehensive MRSA colonization     surveillance program. It is not     intended to diagnose MRSA     infection nor to guide or      monitor treatment for     MRSA infections.  CULTURE, EXPECTORATED SPUTUM-ASSESSMENT     Status: None   Collection Time    08/10/13  4:27 AM      Result Value Range Status   Specimen Description SPUTUM   Final   Special Requests Normal   Final   Sputum evaluation     Final   Value: THIS SPECIMEN IS ACCEPTABLE. RESPIRATORY CULTURE REPORT TO FOLLOW.   Report Status 08/10/2013 FINAL   Final  CULTURE, RESPIRATORY (NON-EXPECTORATED)     Status: None   Collection Time    08/10/13  4:27 AM      Result Value Range Status   Specimen Description SPUTUM   Final   Special Requests NONE   Final   Gram Stain     Final   Value: RARE WBC PRESENT,BOTH PMN AND MONONUCLEAR     RARE SQUAMOUS EPITHELIAL CELLS PRESENT     NO ORGANISMS SEEN     Performed at [facility]   Culture     Final   Value: RARE CANDIDA ALBICANS     Performed at [facility]   Report Status PENDING   Incomplete     Studies: Dg Chest Port 1 View  08/12/2013   CLINICAL DATA:  Pneumonia.  EXAM: PORTABLE CHEST - 1 VIEW  COMPARISON:  08/11/2013.  FINDINGS: PICC line in stable position. Persistent right upper lobe atelectasis and infiltrate present. Tiny right pleural effusion.  Left lung is clear. No pneumothorax. Cardiovascular structures are stable. No acute osseous abnormality.  IMPRESSION: 1. Persistent atelectasis and infiltrate right upper lobe. 2. PICC line in good anatomic position.   Electronically Signed   By: Maisie Fus  Register   On: 08/12/2013 07:05   Dg Chest Port 1 View  08/11/2013   CLINICAL DATA:  Pneumonia.  EXAM: PORTABLE CHEST - 1 VIEW  COMPARISON:  08/09/2013 and CT 08/07/2013  FINDINGS: PICC line tip is in the SVC region. Minimal change in the parenchymal disease in the right upper lung. There continues to be aeration at the right apex. Lucency in the left lung consistent with emphysema. Heart and mediastinum are within normal limits. No evidence for a large pneumothorax. Elevated humeral heads  probably represent underlying rotator cuff disease.  IMPRESSION: No significant change in the right upper lung pneumonia.   Electronically Signed   By: Richarda Overlie M.D.   On: 08/11/2013 07:31    Scheduled Meds: . budesonide  0.25 mg Nebulization Q6H  . carvedilol  3.125 mg Oral BID WC  . doxazosin  8 mg Oral QHS  . enoxaparin (LOVENOX) injection  40 mg Subcutaneous Q24H  . feeding supplement (ENSURE COMPLETE)  237 mL Oral BID BM  . ipratropium  0.5 mg Nebulization Q6H  . levalbuterol  0.63 mg Nebulization Q6H  . levothyroxine  75 mcg Oral QAC breakfast  . methylPREDNISolone (SOLU-MEDROL) injection  20 mg Intravenous Daily  . multivitamin with minerals  1 tablet Oral q morning - 10a  . oxyCODONE-acetaminophen  1 tablet Oral QHS  . pantoprazole  40 mg Oral Daily  . piperacillin-tazobactam (ZOSYN)  IV  3.375 g Intravenous Q8H  . saccharomyces boulardii  250 mg Oral BID  . sodium chloride  3 mL Intravenous Q12H  . vancomycin  1,000 mg Intravenous Q12H  . zolpidem  5 mg Oral QHS   Continuous Infusions:   Active Problems:   PROSTATE CANCER   COPD (chronic obstructive pulmonary disease) with emphysema   Hypothyroidism   Acute-on-chronic respiratory failure   Hyponatremia   HCAP (healthcare-associated pneumonia)/ Probable necrotizing PNA    Time spent:   Zinnia Tindall, J  [facility] Pager [phone]. If 7PM-7AM, please contact night-coverage at [email or web address], password [facility] 08/12/2013, 7:37 AM  LOS: 3 days

## 2013-08-12 NOTE — Progress Notes (Addendum)
Clinical Social Work Department CLINICAL SOCIAL WORK PLACEMENT NOTE 08/12/2013  Patient:  John Black, John Black  Account Number:  [id number] Admit date:  08/09/2013  Clinical Social Worker:  Jacelyn Grip  Date/time:  08/12/2013 05:30 PM  Clinical Social Work is seeking post-discharge placement for this patient at the following level of care:   SKILLED NURSING   (*CSW will update this form in Epic as items are completed)   08/12/2013  Patient/family provided with [facility] Department of Clinical Social Work's list of facilities offering this level of care within the geographic area requested by the patient (or if unable, by the patient's family).  08/12/2013  Patient/family informed of their freedom to choose among providers that offer the needed level of care, that participate in Medicare, Medicaid or managed care program needed by the patient, have an available bed and are willing to accept the patient.  08/12/2013  Patient/family informed of [facility]' ownership interest in [facility], as well as of the fact that they are under no obligation to receive care at this facility.  PASARR submitted to EDS on 08/12/2013 PASARR number received from EDS on 08/12/2013  FL2 transmitted to all facilities in geographic area requested by pt/family on  08/12/2013 FL2 transmitted to all facilities within larger geographic area on   Patient informed that his/her managed care company has contracts with or will negotiate with  certain facilities, including the following:     Patient/family informed of bed offers received:   Patient chooses bed at  Physician recommends and patient chooses bed at    Patient to be transferred to  on   Patient to be transferred to facility by   The following physician request were entered in Epic:   Additional Comments:   [name], MSW, LCSWA  Clinical Social Work [phone]

## 2013-08-12 NOTE — Evaluation (Addendum)
Physical Therapy Evaluation Patient Details Name: John Black [name]: [id number] DOB: [birth year] Today's Date: 08/12/2013 Time: 4098-1191 PT Time Calculation (min): 26 min  PT Assessment / Plan / Recommendation History of Present Illness  John Black is a 79 y.o. male who was recently diagnosed with necrotizing pneumonia has had a CAT scan of his chest yesterday and was found to have worsening of his pneumonia with cavitations. Patient was started on vancomycin Zosyn and Cipro as outpatient after PICC line was placed by patient's pulmonologist. Despite which patient's breathing got worse and was advised to come to the ER. Patient usually uses 4 L of oxygen and presently needs to use 6 L to maintain saturation more than 90% and patient is also using accessory muscles to breathe  Clinical Impression  Pt tolerated ambulation well with O2 4-6 l. Pt will benefit from PT to address problems.    PT Assessment  Patient needs continued PT services    Follow Up Recommendations  Per chart, pt is considering SNF. Pt is very functional, just O2 needs are a problem. Pt may not require PT at DC.    Does the patient have the potential to tolerate intense rehabilitation      Barriers to Discharge        Equipment Recommendations       Recommendations for Other Services     Frequency Min 3X/week    Precautions / Restrictions   monitor sats  Pertinent Vitals/Pain Per RN, pt placed on 6 l due to O2 tank did not have 5. Sats ranged 90-94% Pt did ambulate x 175' on 4 l with sats to 88% after sitting down, Sats returned to 91%. RN placed O@ on 4 [place] and will monitor.      Mobility  Bed Mobility Bed Mobility: Not assessed Transfers Transfers: Sit to Stand;Stand to Sit Sit to Stand: 5: Supervision;From chair/3-in-1;With upper extremity assist Stand to Sit: 5: Supervision;To chair/3-in-1;With upper extremity assist Ambulation/Gait Ambulation/Gait Assistance: 4: Min guard Ambulation  Distance (Feet): 425 Feet Assistive device: Rolling walker Ambulation/Gait Assistance Details: pt ambulated without RW x 200' of the 425. Gait Pattern: Step-through pattern Gait velocity: wfl    Exercises     PT Diagnosis: Difficulty walking  PT Problem List: Cardiopulmonary status limiting activity;Decreased activity tolerance PT Treatment Interventions: Gait training;Patient/family education     PT Goals(Current goals can be found in the care plan section) Acute Rehab PT Goals Patient Stated Goal: to go home if I am able. PT Goal Formulation: With patient Time For Goal Achievement: 08/26/13 Potential to Achieve Goals: Good  Visit Information  Last PT Received On: 08/12/13 Assistance Needed: +1 History of Present Illness: John Black is a 79 y.o. male who was recently diagnosed with necrotizing pneumonia has had a CAT scan of his chest yesterday and was found to have worsening of his pneumonia with cavitations. Patient was started on vancomycin Zosyn and Cipro as outpatient after PICC line was placed by patient's pulmonologist. Despite which patient's breathing got worse and was advised to come to the ER. Patient usually uses 4 L of oxygen and presently needs to use 6 L to maintain saturation more than 90% and patient is also using accessory muscles to breathe       Prior Functioning  Home Living Family/patient expects to be discharged to:: Private residence Living Arrangements: Spouse/significant other Available Help at Discharge: Family Type of Home: House Home Access: Level entry Home Layout: One level Additional Comments: O2 Prior Function Level of  Independence: Independent Communication Communication: No difficulties    Cognition  Cognition Arousal/Alertness: Awake/alert Behavior During Therapy: WFL for tasks assessed/performed Overall Cognitive Status: Within Functional Limits for tasks assessed    Extremity/Trunk Assessment Upper Extremity  Assessment Upper Extremity Assessment: Overall WFL for tasks assessed Lower Extremity Assessment Lower Extremity Assessment: Overall WFL for tasks assessed   Balance Balance Balance Assessed: Yes Static Standing Balance Static Standing - Balance Support: No upper extremity supported Static Standing - Level of Assistance: 7: Independent  End of Session PT - End of Session Equipment Utilized During Treatment: Gait belt Activity Tolerance: Patient tolerated treatment well Patient left: in chair;with call bell/phone within reach Nurse Communication: Mobility status  GP     Rada Hay 08/12/2013, 5:01 PM

## 2013-08-13 LAB — CBC WITH DIFFERENTIAL/PLATELET
Basophils Absolute: 0 10*3/uL (ref 0.0–0.1)
Basophils Relative: 0 % (ref 0–1)
Eosinophils Absolute: 0 10*3/uL (ref 0.0–0.7)
Eosinophils Relative: 0 % (ref 0–5)
HCT: 34 % — ABNORMAL LOW (ref 39.0–52.0)
Hemoglobin: 11 g/dL — ABNORMAL LOW (ref 13.0–17.0)
Lymphocytes Relative: 16 % (ref 12–46)
Lymphs Abs: 1.8 10*3/uL (ref 0.7–4.0)
MCH: 27.2 pg (ref 26.0–34.0)
MCHC: 32.4 g/dL (ref 30.0–36.0)
MCV: 84.2 fL (ref 78.0–100.0)
Monocytes Absolute: 0.9 10*3/uL (ref 0.1–1.0)
Monocytes Relative: 8 % (ref 3–12)
Neutro Abs: 8.7 10*3/uL — ABNORMAL HIGH (ref 1.7–7.7)
Neutrophils Relative %: 76 % (ref 43–77)
Platelets: 461 10*3/uL — ABNORMAL HIGH (ref 150–400)
RBC: 4.04 MIL/uL — ABNORMAL LOW (ref 4.22–5.81)
RDW: 14.2 % (ref 11.5–15.5)
WBC: 11.5 10*3/uL — ABNORMAL HIGH (ref 4.0–10.5)

## 2013-08-13 LAB — BASIC METABOLIC PANEL
BUN: 23 mg/dL (ref 6–23)
CO2: 30 mEq/L (ref 19–32)
Calcium: 9.2 mg/dL (ref 8.4–10.5)
Chloride: 94 mEq/L — ABNORMAL LOW (ref 96–112)
Creatinine, Ser: 1 mg/dL (ref 0.50–1.35)
GFR calc Af Amer: 79 mL/min — ABNORMAL LOW (ref >90)
GFR calc non Af Amer: 68 mL/min — ABNORMAL LOW (ref >90)
Glucose, Bld: 96 mg/dL (ref 70–99)
Potassium: 4 mEq/L (ref 3.5–5.1)
Sodium: 134 mEq/L — ABNORMAL LOW (ref 135–145)

## 2013-08-13 MED ORDER — ZOLPIDEM TARTRATE 5 MG PO TABS
5.0000 mg | ORAL_TABLET | Freq: Every evening | ORAL | Status: DC | PRN
  Administered 2013-08-13 – 2013-08-16 (×4): 5 mg via ORAL
  Filled 2013-08-13 (×4): qty 1

## 2013-08-13 NOTE — Progress Notes (Signed)
[facility] PROGRESS NOTE  John Black AVW:[id number] DOB: [birth year] DOA: 08/09/2013 PCP: Milinda Antis, MD  Assessment/Plan: Acute on chronic respiratory failure  -Improved now on O2 currently at 4LO2 which is patient's home regimen. Per nursing able to ambulate around the ward approximately 400 feet without dropping SpO2. Therefore will transfer out of step down  - Although sputum cultures have been negative will continue the vancomycin and Zosyn. Sputum cultures (rare Candida albicans). -Will perform ambulatory SpO2 if patient does not drop his SpO2 significantly will consider transferring out of unit.  Necrotizing PNA;  -continue antibiotics per pulmonology -Continue patient on methylprednisolone 40 mg every 24hr secondary to wheezing (date 4 of steroids)  COPD  - continue nebulizer and Pulmicort. Continue [place], titrate maintain patient's SpO2 > 93%. - cont Flutter valve during waking hrs   Hypothyroidism  - TSH 2.5 (WNL)  High Troponin; - Pt had only one slightly high troponin, the repeat troponin was within normal limits. Most likely some demand ischemia secondary to patient's tachycardia. Patient's tachycardia improved with addition of Coreg  Tachycardia -Improved with addition of Coreg, however with any sort of exertion heart rate will increase into the 120s to 130s. Secondary to patient's BP unable to increase beta blocker  Pedal edema -TED hose when awake; should help with patient's pedal edema.   Code Status: DNR Family Communication:  Disposition Plan:    Consultants:  Pulmonary(DR Guinevere Ferrari)   Procedures: Sputum culture 11/24; rare Candida albicans, (final)  Blood culture 11/23; NGTD (Pending)  Urine Culture negative (Final)  MRSA PCR; Negative  PICC line placed right arm 08/08/2013  CT chest without contrast 08/07/2013 Increase in extent and severity of necrotic right upper lobe  pneumonia. There are now cavitary areas within the  consolidative  process concerning for pulmonary abscess.   CXR 08/07/2013 Worsening right upper lobe pneumonitis as described above.  Emphysematous changes are also appreciated with air-fluid levels.  Antibiotics:  Ciprofloxacin 11/24>> 11/24  Zosyn 11/24>>  Vancomycin 11/24>>    HPI/Subjective: John Black is a 79 y.o. WM PMHX hyponatremia, thyroidism, emphysema (on 3 L O2 at home), acute on chronic respiratory failure, male who was recently diagnosed with necrotizing pneumonia has had a CAT scan of his chest yesterday and was found to have worsening of his pneumonia with cavitations. Patient was started on vancomycin Zosyn and Cipro as outpatient after PICC line was placed by patient's pulmonologist. Despite which patient's breathing got worse and was advised to come to the ER. Patient usually uses 4 L of oxygen and presently needs to use 6 L to maintain saturation more than 90% and patient is also using accessory muscles to breathe. Chest x-ray does not show anything worsening. Patient in addition has some chest tightness. Patient's daughter states that earlier today patient had fever with temperatures around 101F. Patient otherwise denies any nausea vomiting abdominal pain diarrhea. 08/10/2013 states he was initially seen at [facility] x 2 over last couple of weeks but his breathing cont. To worsen. States Say his Pulmonologist last wk who admitted him to [facility] and placed on ABxand sent him hm w/ PICC line and ABX. After one day O2 requirement continue to Inc and was running fever of 100.6. That's when he decieded to return here last night.08/11/2013 States feeling greatly improved. Now sitting in chair on 4LO2 via [place] which is his home regimen. 08/12/2013 states feeling improved like to know if he can be transferred out of the unit . Negative CP, positive continue  SOB when not on nasal cannula. Unsure if he is DOE secondary to not having exerted himself other than moving from bed to  chair to bathroom. TODAY patient states has been ambulating around the ward on nasal cannula 4 L O2 without SOB, dizziness, headache, CP     Objective: Filed Vitals:   08/12/13 1931 08/12/13 2000 08/13/13 0000 08/13/13 0400  BP:  112/67  117/87  Pulse:      Temp:  97.6 F (36.4 C) 98.3 F (36.8 C) 97.6 F (36.4 C)  TempSrc:  Axillary Oral Oral  Resp:      Height:      Weight:      SpO2: 92%       Intake/Output Summary (Last 24 hours) at 08/13/13 0942 Last data filed at 08/13/13 0600  Gross per 24 hour  Intake   1713 ml  Output   3265 ml  Net  -1552 ml   Filed Weights   08/10/13 0115 08/10/13 0500 08/11/13 0600  Weight: 60.4 kg (133 lb 2.5 oz) 60.9 kg (134 lb 4.2 oz) 60 kg (132 lb 4.4 oz)    Exam:   General:  A/O x 4, NAD  Cardiovascular:  RRR (-) M/R/G, DP/PT pulse +2  Respiratory:  Clear to auscultation bilateral    Abdomen: Soft , NT, ND, (+) BS  Musculoskeletal: Bilat (+) Pedal Edema 1+ to knee   Data Reviewed: Basic Metabolic Panel:  Recent Labs Lab 08/09/13 2125 08/10/13 0245 08/12/13 0335 08/13/13 0341  NA 134* 134* 135 134*  K 4.1 3.7 4.2 4.0  CL 99 100 97 94*  CO2 27 25 29 30   GLUCOSE 114* 124* 116* 96  BUN 20 16 19 23   CREATININE 1.02 0.88 0.84 1.00  CALCIUM 8.9 8.3* 9.0 9.2  MG  --   --  2.2  --   PHOS  --   --  2.5  --    Liver Function Tests:  Recent Labs Lab 08/09/13 2125  AST 23  ALT 25  ALKPHOS 69  BILITOT 0.2*  PROT 6.4  ALBUMIN 2.5*   No results found for this basename: LIPASE, AMYLASE,  in the last 168 hours No results found for this basename: AMMONIA,  in the last 168 hours CBC:  Recent Labs Lab 08/09/13 2125 08/10/13 0245 08/12/13 0335 08/13/13 0915  WBC 10.0 9.1 9.5 11.5*  NEUTROABS 7.3  --   --  8.7*  HGB 10.6* 9.7* 10.2* 11.0*  HCT 31.7* 29.8* 30.9* 34.0*  MCV 82.3 83.0 83.1 84.2  PLT 431* 377 462* 461*   Cardiac Enzymes:  Recent Labs Lab 08/10/13 0245 08/10/13 1110 08/10/13 1325 08/10/13 2235   TROPONINI <0.30 <0.30 0.33* <0.30   BNP (last 3 results)  Recent Labs  07/24/13 2004 08/10/13 0245  PROBNP 335.2 888.5*   CBG: No results found for this basename: GLUCAP,  in the last 168 hours  Recent Results (from the past 240 hour(s))  URINE CULTURE     Status: None   Collection Time    08/09/13  9:14 PM      Result Value Range Status   Specimen Description URINE, CLEAN CATCH   Final   Special Requests NONE   Final   Culture  Setup Time     Final   Value: 08/10/2013 03:35     Performed at [facility] Count     Final   Value: NO GROWTH     Performed at [facility]   Culture     Final   Value: NO GROWTH     Performed at [facility]   Report Status 08/10/2013 FINAL   Final  CULTURE, BLOOD (ROUTINE X 2)     Status: None   Collection Time    08/09/13  9:25 PM      Result Value Range Status   Specimen Description BLOOD LEFT ANTECUBITAL   Final   Special Requests BOTTLES DRAWN AEROBIC AND ANAEROBIC 5CC   Final   Culture  Setup Time     Final   Value: 08/10/2013 02:53     Performed at [facility]   Culture     Final   Value:        BLOOD CULTURE RECEIVED NO GROWTH TO DATE CULTURE WILL BE HELD FOR 5 DAYS BEFORE ISSUING A FINAL NEGATIVE REPORT     Performed at [facility]   Report Status PENDING   Incomplete  CULTURE, BLOOD (ROUTINE X 2)     Status: None   Collection Time    08/09/13  9:25 PM      Result Value Range Status   Specimen Description BLOOD RIGHT ANTECUBITAL   Final   Special Requests BOTTLES DRAWN AEROBIC AND ANAEROBIC 5CC   Final   Culture  Setup Time     Final   Value: 08/10/2013 02:53     Performed at [facility]   Culture     Final   Value:        BLOOD CULTURE RECEIVED NO GROWTH TO DATE CULTURE WILL BE HELD FOR 5 DAYS BEFORE ISSUING A FINAL NEGATIVE REPORT     Performed at [facility]   Report Status PENDING   Incomplete  MRSA PCR SCREENING     Status: None   Collection Time     08/10/13  1:58 AM      Result Value Range Status   MRSA by PCR NEGATIVE  NEGATIVE Final   Comment:            The GeneXpert MRSA Assay (FDA     approved for NASAL specimens     only), is one component of a     comprehensive MRSA colonization     surveillance program. It is not     intended to diagnose MRSA     infection nor to guide or     monitor treatment for     MRSA infections.  CULTURE, EXPECTORATED SPUTUM-ASSESSMENT     Status: None   Collection Time    08/10/13  4:27 AM      Result Value Range Status   Specimen Description SPUTUM   Final   Special Requests Normal   Final   Sputum evaluation     Final   Value: THIS SPECIMEN IS ACCEPTABLE. RESPIRATORY CULTURE REPORT TO FOLLOW.   Report Status 08/10/2013 FINAL   Final  CULTURE, RESPIRATORY (NON-EXPECTORATED)     Status: None   Collection Time    08/10/13  4:27 AM      Result Value Range Status   Specimen Description SPUTUM   Final   Special Requests NONE   Final   Gram Stain     Final   Value: RARE WBC PRESENT,BOTH PMN AND MONONUCLEAR     RARE SQUAMOUS EPITHELIAL CELLS PRESENT     NO ORGANISMS SEEN     Performed at [facility]     Final  Value: RARE CANDIDA ALBICANS     Performed at [facility]   Report Status 08/12/2013 FINAL   Final     Studies: Dg Chest Port 1 View  08/12/2013   CLINICAL DATA:  Pneumonia.  EXAM: PORTABLE CHEST - 1 VIEW  COMPARISON:  08/11/2013.  FINDINGS: PICC line in stable position. Persistent right upper lobe atelectasis and infiltrate present. Tiny right pleural effusion. Left lung is clear. No pneumothorax. Cardiovascular structures are stable. No acute osseous abnormality.  IMPRESSION: 1. Persistent atelectasis and infiltrate right upper lobe. 2. PICC line in good anatomic position.   Electronically Signed   By: Maisie Fus  Register   On: 08/12/2013 07:05    Scheduled Meds: . budesonide  0.25 mg Nebulization Q6H  . carvedilol  3.125 mg Oral BID WC  . doxazosin  8  mg Oral QHS  . enoxaparin (LOVENOX) injection  40 mg Subcutaneous Q24H  . feeding supplement (ENSURE COMPLETE)  237 mL Oral BID BM  . ipratropium  0.5 mg Nebulization Q6H  . levalbuterol  0.63 mg Nebulization Q6H  . levothyroxine  75 mcg Oral QAC breakfast  . multivitamin with minerals  1 tablet Oral q morning - 10a  . oxyCODONE-acetaminophen  1 tablet Oral QHS  . pantoprazole  40 mg Oral Daily  . piperacillin-tazobactam (ZOSYN)  IV  3.375 g Intravenous Q8H  . predniSONE  30 mg Oral Q breakfast  . saccharomyces boulardii  250 mg Oral BID  . sodium chloride  3 mL Intravenous Q12H  . vancomycin  1,000 mg Intravenous Q12H   Continuous Infusions: . sodium chloride 500 mL (08/12/13 1552)    Active Problems:   PROSTATE CANCER   COPD (chronic obstructive pulmonary disease) with emphysema   Hypothyroidism   Acute-on-chronic respiratory failure   Hyponatremia   HCAP (healthcare-associated pneumonia)/ Probable necrotizing PNA    Time spent:   WOODS, CURTIS, J  [facility] Pager [phone]. If 7PM-7AM, please contact night-coverage at [email or web address], password [facility] 08/13/2013, 9:42 AM  LOS: 4 days

## 2013-08-13 NOTE — Progress Notes (Signed)
Picked up patient from another nurse and I agree with her assessment.  Patient is now sleeping and will continue to monitor him.  Ernesta Amble, RN

## 2013-08-13 NOTE — Progress Notes (Signed)
[facility]    Date of Admission:  08/09/2013   Total days of antibiotics 5        Day 5 piptazo        Day 5 vanco           ID: John Black is a 79 y.o. male with necrotizing PNA.  Active Problems:   PROSTATE CANCER   COPD (chronic obstructive pulmonary disease) with emphysema   Hypothyroidism   Acute-on-chronic respiratory failure   Hyponatremia   HCAP (healthcare-associated pneumonia)/ Probable necrotizing PNA    Subjective: Improved shortness for breath on 4L [place]  Medications:  . budesonide  0.25 mg Nebulization Q6H  . carvedilol  3.125 mg Oral BID WC  . doxazosin  8 mg Oral QHS  . enoxaparin (LOVENOX) injection  40 mg Subcutaneous Q24H  . feeding supplement (ENSURE COMPLETE)  237 mL Oral BID BM  . ipratropium  0.5 mg Nebulization Q6H  . levalbuterol  0.63 mg Nebulization Q6H  . levothyroxine  75 mcg Oral QAC breakfast  . multivitamin with minerals  1 tablet Oral q morning - 10a  . oxyCODONE-acetaminophen  1 tablet Oral QHS  . pantoprazole  40 mg Oral Daily  . piperacillin-tazobactam (ZOSYN)  IV  3.375 g Intravenous Q8H  . predniSONE  30 mg Oral Q breakfast  . saccharomyces boulardii  250 mg Oral BID  . sodium chloride  3 mL Intravenous Q12H  . vancomycin  1,000 mg Intravenous Q12H    Objective: Vital signs in last 24 hours: Temp:  [97.5 F (36.4 C)-98.3 F (36.8 C)] 97.6 F (36.4 C) (11/27 0400) Pulse Rate:  [90-114] 112 (11/26 1800) Resp:  [16-24] 23 (11/26 1800) BP: (112-123)/(67-87) 117/87 mmHg (11/27 0400) SpO2:  [92 %-96 %] 92 % (11/26 1931) General: alert, oriented, elderly male in NAD  Eyes: Anicteric sclerae.  ENT: Oropharynx clear. Dry mucous membranes. No thrush  Lymph: No cervical, supraclavicular, or axillary lymphadenopathy.  Heart: Normal S1, S2. No murmurs, rubs, or gallops appreciated. No bruits, equal pulses.PVC's per monitor  Lungs: Right lung with few scattered crackles. Few  expiratory wheezes on R.    Abdomen: Abdomen soft, non-tender and not distended, normoactive bowel sounds.  Musculoskeletal: No clubbing or synovitis. 1 to 2 + LE edema.  Skin: No rashes or lesions  Neuro: No focal neurologic deficits.  Lab Results  Recent Labs  08/12/13 0335 08/13/13 0341 08/13/13 0915  WBC 9.5  --  11.5*  HGB 10.2*  --  11.0*  HCT 30.9*  --  34.0*  NA 135 134*  --   K 4.2 4.0  --   CL 97 94*  --   CO2 29 30  --   BUN 19 23  --   CREATININE 0.84 1.00  --     Microbiology: 11/24 expectorant cx: few c.albicans  Studies/Results: Dg Chest Port 1 View  08/12/2013   CLINICAL DATA:  Pneumonia.  EXAM: PORTABLE CHEST - 1 VIEW  COMPARISON:  08/11/2013.  FINDINGS: PICC line in stable position. Persistent right upper lobe atelectasis and infiltrate present. Tiny right pleural effusion. Left lung is clear. No pneumothorax. Cardiovascular structures are stable. No acute osseous abnormality.  IMPRESSION: 1. Persistent atelectasis and infiltrate right upper lobe. 2. PICC line in good anatomic position.   Electronically Signed   By: John Fus  Black   On: 08/12/2013 07:05     Assessment/Plan: plan is unchanged 79 yo M with necrotizing pneumonia = continue with  piptazo and vancomycin for now. We can devise easier regimen for outpatient, but will likely include vancomycin. When ready for discharge for home or snf, can devise regimen with easier dosing.  John Salvia Drue Second MD MPH [facility] [phone]  08/13/2013, 1:53 PM

## 2013-08-13 NOTE — Progress Notes (Signed)
Report given to fourth floor nurse at this time.

## 2013-08-13 NOTE — Progress Notes (Signed)
Pt transferred to room 1417 at this time. Telemetry monitor on and intact. O2 at 4 l/min via [place]. Pt alert and oriented to transfer.  Family at bedside.

## 2013-08-14 LAB — CBC WITH DIFFERENTIAL/PLATELET
Basophils Absolute: 0 10*3/uL (ref 0.0–0.1)
Basophils Relative: 0 % (ref 0–1)
Eosinophils Absolute: 0.1 10*3/uL (ref 0.0–0.7)
Eosinophils Relative: 0 % (ref 0–5)
HCT: 34.4 % — ABNORMAL LOW (ref 39.0–52.0)
Hemoglobin: 11.2 g/dL — ABNORMAL LOW (ref 13.0–17.0)
Lymphocytes Relative: 29 % (ref 12–46)
Lymphs Abs: 3.2 10*3/uL (ref 0.7–4.0)
MCH: 27.3 pg (ref 26.0–34.0)
MCHC: 32.6 g/dL (ref 30.0–36.0)
MCV: 83.7 fL (ref 78.0–100.0)
Monocytes Absolute: 1.1 10*3/uL — ABNORMAL HIGH (ref 0.1–1.0)
Monocytes Relative: 10 % (ref 3–12)
Neutro Abs: 6.8 10*3/uL (ref 1.7–7.7)
Neutrophils Relative %: 61 % (ref 43–77)
Platelets: 478 10*3/uL — ABNORMAL HIGH (ref 150–400)
RBC: 4.11 MIL/uL — ABNORMAL LOW (ref 4.22–5.81)
RDW: 14.3 % (ref 11.5–15.5)
WBC: 11.2 10*3/uL — ABNORMAL HIGH (ref 4.0–10.5)

## 2013-08-14 MED ORDER — PREDNISONE 20 MG PO TABS
20.0000 mg | ORAL_TABLET | Freq: Every day | ORAL | Status: DC
  Administered 2013-08-15 – 2013-08-17 (×3): 20 mg via ORAL
  Filled 2013-08-14 (×4): qty 1

## 2013-08-14 MED ORDER — SODIUM CHLORIDE 0.9 % IJ SOLN
10.0000 mL | INTRAMUSCULAR | Status: DC | PRN
  Administered 2013-08-16 – 2013-08-17 (×3): 10 mL

## 2013-08-14 NOTE — Progress Notes (Signed)
Spoke in detail with family on 08/12/13 at bedside. At that time family was concerned about taking patient home with iv antibiotics and were inquiring about ST-SNF. Noted therapy has on PT follow up recommendations. Patient was active with [facility] prior to admission. If patient does go home, patient could still benefit from home health services. [facility] Care Management will follow up post hospital discharge as well. Call made to inpatient RNCM to make aware of the above.  Raiford Noble, MSN-Ed, RN,BSN- [facility] [phone]

## 2013-08-14 NOTE — Progress Notes (Signed)
Physical Therapy Treatment and Discharge Patient Details Name: John Black [name]: [id number] DOB: 01-01-1929 Today's Date: 08/14/2013 Time: 1035-1100 PT Time Calculation (min): 25 min  PT Assessment / Plan / Recommendation  History of Present Illness John Black is a 79 y.o. male who was recently diagnosed with necrotizing pneumonia has had a CAT scan of his chest yesterday and was found to have worsening of his pneumonia with cavitations. Patient was started on vancomycin Zosyn and Cipro as outpatient after PICC line was placed by patient's pulmonologist. Despite which patient's breathing got worse and was advised to come to the ER. Patient usually uses 4 L of oxygen and presently needs to use 6 L to maintain saturation more than 90% and patient is also using accessory muscles to breathe   PT Comments   Pt doing well with mobility.  Pt SaO2 dropped to 87% on 4L during ambulation so discussed possible need for keeping on 6L with mobility and activity.  Pt reports he has pulse ox at home so educated to maintain sats above 88%.  Pt also educated on HEP for strengthening and endurance and encouraged to rest when fatigued or SOB during activity and exercise.  Pt education complete and pt agreeable to d/c from acute PT services.  Pt agreeable to ambulate with staff during hospitalization due to lines/cords.  PT to sign off.   Follow Up Recommendations  No PT follow up     Does the patient have the potential to tolerate intense rehabilitation     Barriers to Discharge        Equipment Recommendations       Recommendations for Other Services    Frequency     Progress towards PT Goals Progress towards PT goals: Goals met/education completed, patient discharged from PT  Plan Other (comment) (d/c from PT)    Precautions / Restrictions Precautions Precaution Comments: monitor SaO2   Pertinent Vitals/Pain See section below SaO2 on 4L 92% upon leaving room    Mobility  Bed  Mobility Bed Mobility: Supine to Sit;Sit to Supine Supine to Sit: 7: Independent Sit to Supine: 7: Independent Transfers Transfers: Sit to Stand;Stand to Sit Sit to Stand: 6: Modified independent (Device/Increase time) Stand to Sit: 6: Modified independent (Device/Increase time) Ambulation/Gait Ambulation/Gait Assistance: 5: Supervision Ambulation Distance (Feet): 450 Feet Ambulation/Gait Assistance Details: pt pushed IV pole, supervision only for lines, SaO2 dropped to 87% on 4 L so increased to 6L and SaO2 improved to 90-92% during ambulation Gait Pattern: Step-through pattern Gait velocity: [facility]    Exercises General Exercises - Lower Extremity Long Arc Quad: AROM;Both;10 reps;Seated Hip ABduction/ADduction: AROM;Both;15 reps;Standing (with bil UE support) Hip Flexion/Marching: AROM;Both;Seated;15 reps Heel Raises: AROM;Both;15 reps;Standing (with bil UE support)   PT Diagnosis:    PT Problem List:   PT Treatment Interventions:     PT Goals (current goals can now be found in the care plan section)    Visit Information  Last PT Received On: 08/14/13 Assistance Needed: +1 History of Present Illness: John Black is a 79 y.o. male who was recently diagnosed with necrotizing pneumonia has had a CAT scan of his chest yesterday and was found to have worsening of his pneumonia with cavitations. Patient was started on vancomycin Zosyn and Cipro as outpatient after PICC line was placed by patient's pulmonologist. Despite which patient's breathing got worse and was advised to come to the ER. Patient usually uses 4 L of oxygen and presently needs to use 6 L to maintain saturation more  than 90% and patient is also using accessory muscles to breathe    Subjective Data      Cognition  Cognition Arousal/Alertness: Awake/alert Behavior During Therapy: WFL for tasks assessed/performed Overall Cognitive Status: Within Functional Limits for tasks assessed    Balance     End of Session  PT - End of Session Activity Tolerance: Patient tolerated treatment well Patient left: with call bell/phone within reach;in bed Nurse Communication: Mobility status   GP     Kaylob Wallen,KATHrine E 08/14/2013, 12:16 PM Zenovia Jarred, PT, DPT 08/14/2013 Pager: [phone]

## 2013-08-14 NOTE — Care Management Note (Addendum)
    Page 1 of 2   08/17/2013     12:52:18 PM   CARE MANAGEMENT NOTE 08/17/2013  Patient:  John Black,John Black   Account Number:  [id number]  Date Initiated:  08/13/2013  Documentation initiated by:  DAVIS,RHONDA  Subjective/Objective Assessment:   pt transferred out of sdu to tele floor [id number]/decreased 02 needsw still at 4l/ to maintain at 92%     Action/Plan:   home with 02   Anticipated DC Date:  08/17/2013   Anticipated DC Plan:  HOME W HOME HEALTH SERVICES      DC Planning Services  CM consult      [facility] Choice  Resumption Of Svcs/PTA Provider   Choice offered to / List presented to:  C-1 Patient   DME arranged  [facility]OWER STOOL  [facility] ROLLING      DME agency  [facility]     [facility] arranged  HH-1 RN  IV Antibiotics  HH-2 PT      HH agency  [facility]   Status of service:  Completed, signed off Medicare Important Message given?  NA - LOS <3 / Initial given by admissions (If response is "NO", the following Medicare IM given date fields will be blank) Date Medicare IM given:   Date Additional Medicare IM given:    Discharge Disposition:  HOME W HOME HEALTH SERVICES  Per UR Regulation:  Reviewed for med. necessity/level of care/duration of stay  If discussed at Long Length of Stay Meetings, dates discussed:    Comments:  08/17/13 Cameren Earnest RN,BSN NCM [phone] SPOKE TO PATIENT/PATIENT/SPOUSE ABOUT D/C PLANS.PATIENT/SPOUSE WANTS HOME [facility].ALREADY ACTIVE [facility] HHRN-IV ABX-HAS PICC.ORDERED FOR HHRN-IV ABX-VANC Q12 1X/DAY.HHPT,SHOWER CHAIR,RW.[facility] REP AWARE OF D/C & HH ORDERS.[facility] DME REP WILL BRING TRAVEL HOME 02 TO RM,& SHOWER CHAIR,RW.  08/14/13 Tylie Golonka RN,BSN NCM [phone] TRANSFER FROM SDU.ALREADY ACTIVE W/[facility]-HHRN-IV ABX-PICC.HOME 02-[facility],HAS TRAVEL.IF TO CONTINUE HOME IV ABX,PLEASE PUT IN HHRN ORDER.[facility] FOLLOWING FOR CHRONIC DZ MGMNT.  [id number] Earlene Plater, RN,BSN,CCM: Case management [phone] Chart reviewed and updated.   Next chart review due on [id number]. Needs for discharge at time of review:  none

## 2013-08-14 NOTE — Progress Notes (Signed)
PULMONARY  / CRITICAL CARE MEDICINE  Name: John Black [name]: [id number] DOB: 1929/01/02    ADMISSION DATE:  08/09/2013 CONSULTATION DATE:  08/09/13  REFERRING MD :  Midge Minium PRIMARY SERVICE: [facility]  CHIEF COMPLAINT:  SOB, necrotizing pneumonia  BRIEF PATIENT DESCRIPTION: 79 y/o male with PMH relevant for severe COPD on home oxygen, prostate cancer and hypothyroidism. Recently hospitalized with a RUL necrotizing pneumonia. Sent home with PICC line with abx. Now back in the hospital with increased SOB and low grade fever. Critical care consulted for further recommendations.  SIGNIFICANT EVENTS / STUDIES:  11/21 - Chest X ray: RUL necrotizing pneumonia. 11/23 - Re-Admit, reported dyspnea at rest 11/24 - improved dyspnea, desats with activity to mid 80's 11/26 - improved aeration on CXR, improved saturations  LINES / TUBES: 10/25 PICC line>>>  CULTURES: BCx2 11/23>>> UC 11/23>>>No growth Sputum 11/23>>>neg  ANTIBIOTICS: Zosyn 11/23>>> Ciprofloxacin 11/23>>>11/24 Vancomycin 11/23>>>  SUBJECTIVE: Neg 3L with one dose lasix 11/26.  Back to baseline O2 requirements.  No c/o's  VITAL SIGNS: Temp:  [97.4 F (36.3 C)-98.4 F (36.9 C)] 98.2 F (36.8 C) (11/28 0428) Pulse Rate:  [77-81] 77 (11/28 0428) Resp:  [18] 18 (11/28 0428) BP: (104-107)/(59-64) 105/64 mmHg (11/28 0924) SpO2:  [93 %-97 %] 97 % (11/28 0900) Weight:  [123 lb 3.8 oz (55.9 kg)] 123 lb 3.8 oz (55.9 kg) (11/28 0428)  INTAKE / OUTPUT: Intake/Output     11/27 0701 - 11/28 0700 11/28 0701 - 11/29 0700   P.O. 360    I.V. (mL/kg)     IV Piggyback 100    Total Intake(mL/kg) 460 (8.2)    Urine (mL/kg/hr) 1450 (1.1)    Total Output 1450     Net -990          Stool Occurrence 1 x      PHYSICAL EXAMINATION: General: alert, oriented, elderly male in NAD ENT: Oropharynx clear. Dry mucous membranes. No thrush Lymph: No cervical, supraclavicular, or axillary lymphadenopathy. Heart: Normal  S1, S2. No murmurs, rubs, or gallops appreciated. No bruits, equal pulses. PVC's per monitor Lungs: resp's even/non-labored, RUL with few rhonchi, otherwise clear Abdomen: Abdomen soft, non-tender and not distended, normoactive bowel sounds.  Musculoskeletal: No clubbing or synovitis. 1 to 2 + LE edema. Skin: No rashes or lesions Neuro: No focal neurologic deficits.  LABS:  CBC  Recent Labs Lab 08/12/13 0335 08/13/13 0915 08/14/13 0620  WBC 9.5 11.5* 11.2*  HGB 10.2* 11.0* 11.2*  HCT 30.9* 34.0* 34.4*  PLT 462* 461* 478*   BMET  Recent Labs Lab 08/10/13 0245 08/12/13 0335 08/13/13 0341  NA 134* 135 134*  K 3.7 4.2 4.0  CL 100 97 94*  CO2 25 29 30   BUN 16 19 23   CREATININE 0.88 0.84 1.00  GLUCOSE 124* 116* 96   Electrolytes  Recent Labs Lab 08/10/13 0245 08/12/13 0335 08/13/13 0341  CALCIUM 8.3* 9.0 9.2  MG  --  2.2  --   PHOS  --  2.5  --    Liver Enzymes  Recent Labs Lab 08/09/13 2125  AST 23  ALT 25  ALKPHOS 69  BILITOT 0.2*  ALBUMIN 2.5*   Cardiac Enzymes  Recent Labs Lab 08/10/13 0245 08/10/13 1110 08/10/13 1325 08/10/13 2235  TROPONINI <0.30 <0.30 0.33* <0.30  PROBNP 888.5*  --   --   --     Imaging No results found.  ASSESSMENT / PLAN:  PULMONARY A: RUL necrotizing pneumonia - Chest X ray with increased  density of RUL consolidation on admit. Improvement of RLL infiltrates previously seen on X ray from 11/7. No elevated WBC, no fever since admission. Only 100.4 at home. Not certain this is antibiotic failure. CXR has improved aeration on 11/26 with improved clinical status.    P:   - Continue Zosyn, vancomycin. Appreciate ID's assistance.  Pt concerned about home regimen simplicity - was previously on 3 abx with 3 different schedules.  Family had a difficult time with administration schedule.  Will need to take into consideration for d/c.  - Continue Pulmicort and duonebs. - Transition to oral pred with quick taper to off - reduce  to 20 mg QD 11/28 - Continue Chest PT, incentive spirometry. - Supplemental oxygen to keep O2 sat >92%. - Increased activity as tolerated. - CXR f/u 11/29 - PT eval  -continued probiotic, with such abx exposure  Code status: - DNR / DNI  PCCM will be available PRN over weekend.  Please call if needs arise. Otherwise, we will see again on 12/1.   Canary Brim, NP-C [facility] Pgr: [phone] or [phone]  08/14/2013, 9:31 AM  I have fully examined this patient and agree with above findings.      Mcarthur Rossetti. Tyson Alias, MD, FACP Pgr: [phone] [facility]

## 2013-08-14 NOTE — Progress Notes (Signed)
[facility] PROGRESS NOTE  Chane Cowden ZOX:[id number] DOB: 29-May-1929 DOA: 08/09/2013 PCP: Milinda Antis, MD  Assessment/Plan: Acute on chronic respiratory failure  -Improved now on O2 currently at 4LO2 which is patient's home regimen. Per nursing able to ambulate around the ward approximately 400 feet without dropping SpO2. Therefore will transfer out of step down  - Although sputum cultures have been negative will continue the vancomycin and Zosyn. Sputum cultures (rare Candida albicans). -Patient ambulated hallway had to increase O2 to 5 L, but was able to complete walk  Necrotizing PNA;  -continue antibiotics per pulmonology -Per PCCM DC methylprednisolone 40 mg and start patient on prednisone 20 mg daily  -Will speak with infectious disease to determine the length of treatment given his particular case.  COPD  - continue nebulizer and Pulmicort. Continue Briggs, titrate maintain patient's SpO2 > 93%. - cont Flutter valve during waking hrs   Hypothyroidism  - TSH 2.5 (WNL)  High Troponin; - Pt had only one slightly high troponin, the repeat troponin was within normal limits. Most likely some demand ischemia secondary to patient's tachycardia. Patient's tachycardia improved with addition of Coreg  Tachycardia -Improved with addition of Coreg, however with any sort of exertion heart rate will increase into the 120s to 130s. Secondary to patient's BP unable to increase beta blocker  Pedal edema -TED hose when awake; helping with patient's pedal edema.   Code Status: DNR Family Communication:  Disposition Plan:    Consultants:  Pulmonary(DR Guinevere Ferrari)   Procedures: Sputum culture 11/24; rare Candida albicans, (final)  Blood culture 11/23; NGTD (Pending)  Urine Culture negative (Final)  MRSA PCR; Negative  PICC line placed right arm 08/08/2013  CT chest without contrast 08/07/2013 Increase in extent and severity of necrotic right upper lobe   pneumonia. There are now cavitary areas within the consolidative  process concerning for pulmonary abscess.   CXR 08/07/2013 Worsening right upper lobe pneumonitis as described above.  Emphysematous changes are also appreciated with air-fluid levels.  Antibiotics:  Ciprofloxacin 11/24>> 11/24  Zosyn 11/24>>  Vancomycin 11/24>>    HPI/Subjective: John Black is a 79 y.o. WM PMHX hyponatremia, thyroidism, emphysema (on 3 L O2 at home), acute on chronic respiratory failure, male who was recently diagnosed with necrotizing pneumonia has had a CAT scan of his chest yesterday and was found to have worsening of his pneumonia with cavitations. Patient was started on vancomycin Zosyn and Cipro as outpatient after PICC line was placed by patient's pulmonologist. Despite which patient's breathing got worse and was advised to come to the ER. Patient usually uses 4 L of oxygen and presently needs to use 6 L to maintain saturation more than 90% and patient is also using accessory muscles to breathe. Chest x-ray does not show anything worsening. Patient in addition has some chest tightness. Patient's daughter states that earlier today patient had fever with temperatures around 101F. Patient otherwise denies any nausea vomiting abdominal pain diarrhea. 08/10/2013 states he was initially seen at [facility] x 2 over last couple of weeks but his breathing cont. To worsen. States Say his Pulmonologist last wk who admitted him to [facility] and placed on ABxand sent him hm w/ PICC line and ABX. After one day O2 requirement continue to Inc and was running fever of 100.6. That's when he decieded to return here last night.08/11/2013 States feeling greatly improved. Now sitting in chair on 4LO2 via [place] which is his home regimen. 08/12/2013 states feeling improved like to know if he  can be transferred out of the unit . Negative CP, positive continue SOB when not on nasal cannula. Unsure if he is DOE secondary to not  having exerted himself other than moving from bed to chair to bathroom. 08/13/2013 patient states has been ambulating around the ward on nasal cannula 4 L O2 without SOB, dizziness, headache, CP TODAY in chair comfortably not on O2 able to converse normally. Stated ambulated up and down hallway without significant discomfort however had to increase O2 to 5 L in order to maintain his SpO2> 92%     Objective: Filed Vitals:   08/14/13 0234 08/14/13 0428 08/14/13 0900 08/14/13 0924  BP:  107/62  105/64  Pulse:  77    Temp:  98.2 F (36.8 C)    TempSrc:  Oral    Resp:  18    Height:      Weight:  55.9 kg (123 lb 3.8 oz)    SpO2: 93% 94% 97%     Intake/Output Summary (Last 24 hours) at 08/14/13 1247 Last data filed at 08/14/13 1202  Gross per 24 hour  Intake    820 ml  Output   1651 ml  Net   -831 ml   Filed Weights   08/10/13 0500 08/11/13 0600 08/14/13 0428  Weight: 60.9 kg (134 lb 4.2 oz) 60 kg (132 lb 4.4 oz) 55.9 kg (123 lb 3.8 oz)    Exam:   General:  A/O x 4, NAD  Cardiovascular:  RRR (-) M/R/G, DP/PT pulse +2  Respiratory:  Clear to auscultation bilateral except for mild left upper lobe wheezing   Abdomen: Soft , NT, ND, (+) BS  Musculoskeletal: Right (+) Pedal Edema 1+ to just above the ankle. Patient has TED hose on  Data Reviewed: Basic Metabolic Panel:  Recent Labs Lab 08/09/13 2125 08/10/13 0245 08/12/13 0335 08/13/13 0341  NA 134* 134* 135 134*  K 4.1 3.7 4.2 4.0  CL 99 100 97 94*  CO2 27 25 29 30   GLUCOSE 114* 124* 116* 96  BUN 20 16 19 23   CREATININE 1.02 0.88 0.84 1.00  CALCIUM 8.9 8.3* 9.0 9.2  MG  --   --  2.2  --   PHOS  --   --  2.5  --    Liver Function Tests:  Recent Labs Lab 08/09/13 2125  AST 23  ALT 25  ALKPHOS 69  BILITOT 0.2*  PROT 6.4  ALBUMIN 2.5*   No results found for this basename: LIPASE, AMYLASE,  in the last 168 hours No results found for this basename: AMMONIA,  in the last 168 hours CBC:  Recent Labs Lab  08/09/13 2125 08/10/13 0245 08/12/13 0335 08/13/13 0915 08/14/13 0620  WBC 10.0 9.1 9.5 11.5* 11.2*  NEUTROABS 7.3  --   --  8.7* 6.8  HGB 10.6* 9.7* 10.2* 11.0* 11.2*  HCT 31.7* 29.8* 30.9* 34.0* 34.4*  MCV 82.3 83.0 83.1 84.2 83.7  PLT 431* 377 462* 461* 478*   Cardiac Enzymes:  Recent Labs Lab 08/10/13 0245 08/10/13 1110 08/10/13 1325 08/10/13 2235  TROPONINI <0.30 <0.30 0.33* <0.30   BNP (last 3 results)  Recent Labs  07/24/13 2004 08/10/13 0245  PROBNP 335.2 888.5*   CBG: No results found for this basename: GLUCAP,  in the last 168 hours  Recent Results (from the past 240 hour(s))  URINE CULTURE     Status: None   Collection Time    08/09/13  9:14 PM  Result Value Range Status   Specimen Description URINE, CLEAN CATCH   Final   Special Requests NONE   Final   Culture  Setup Time     Final   Value: 08/10/2013 03:35     Performed at [facility] Count     Final   Value: NO GROWTH     Performed at [facility]   Culture     Final   Value: NO GROWTH     Performed at [facility]   Report Status 08/10/2013 FINAL   Final  CULTURE, BLOOD (ROUTINE X 2)     Status: None   Collection Time    08/09/13  9:25 PM      Result Value Range Status   Specimen Description BLOOD LEFT ANTECUBITAL   Final   Special Requests BOTTLES DRAWN AEROBIC AND ANAEROBIC 5CC   Final   Culture  Setup Time     Final   Value: 08/10/2013 02:53     Performed at [facility]   Culture     Final   Value:        BLOOD CULTURE RECEIVED NO GROWTH TO DATE CULTURE WILL BE HELD FOR 5 DAYS BEFORE ISSUING A FINAL NEGATIVE REPORT     Performed at [facility]   Report Status PENDING   Incomplete  CULTURE, BLOOD (ROUTINE X 2)     Status: None   Collection Time    08/09/13  9:25 PM      Result Value Range Status   Specimen Description BLOOD RIGHT ANTECUBITAL   Final   Special Requests BOTTLES DRAWN AEROBIC AND ANAEROBIC 5CC   Final   Culture   Setup Time     Final   Value: 08/10/2013 02:53     Performed at [facility]   Culture     Final   Value:        BLOOD CULTURE RECEIVED NO GROWTH TO DATE CULTURE WILL BE HELD FOR 5 DAYS BEFORE ISSUING A FINAL NEGATIVE REPORT     Performed at [facility]   Report Status PENDING   Incomplete  MRSA PCR SCREENING     Status: None   Collection Time    08/10/13  1:58 AM      Result Value Range Status   MRSA by PCR NEGATIVE  NEGATIVE Final   Comment:            The GeneXpert MRSA Assay (FDA     approved for NASAL specimens     only), is one component of a     comprehensive MRSA colonization     surveillance program. It is not     intended to diagnose MRSA     infection nor to guide or     monitor treatment for     MRSA infections.  CULTURE, EXPECTORATED SPUTUM-ASSESSMENT     Status: None   Collection Time    08/10/13  4:27 AM      Result Value Range Status   Specimen Description SPUTUM   Final   Special Requests Normal   Final   Sputum evaluation     Final   Value: THIS SPECIMEN IS ACCEPTABLE. RESPIRATORY CULTURE REPORT TO FOLLOW.   Report Status 08/10/2013 FINAL   Final  CULTURE, RESPIRATORY (NON-EXPECTORATED)     Status: None   Collection Time    08/10/13  4:27 AM      Result Value Range Status  Specimen Description SPUTUM   Final   Special Requests NONE   Final   Gram Stain     Final   Value: RARE WBC PRESENT,BOTH PMN AND MONONUCLEAR     RARE SQUAMOUS EPITHELIAL CELLS PRESENT     NO ORGANISMS SEEN     Performed at [facility]   Culture     Final   Value: RARE CANDIDA ALBICANS     Performed at [facility]   Report Status 08/12/2013 FINAL   Final     Studies: No results found.  Scheduled Meds: . budesonide  0.25 mg Nebulization Q6H  . carvedilol  3.125 mg Oral BID WC  . doxazosin  8 mg Oral QHS  . enoxaparin (LOVENOX) injection  40 mg Subcutaneous Q24H  . feeding supplement (ENSURE COMPLETE)  237 mL Oral BID BM  . ipratropium  0.5  mg Nebulization Q6H  . levalbuterol  0.63 mg Nebulization Q6H  . levothyroxine  75 mcg Oral QAC breakfast  . multivitamin with minerals  1 tablet Oral q morning - 10a  . oxyCODONE-acetaminophen  1 tablet Oral QHS  . pantoprazole  40 mg Oral Daily  . piperacillin-tazobactam (ZOSYN)  IV  3.375 g Intravenous Q8H  . [START ON 08/15/2013] predniSONE  20 mg Oral Q breakfast  . saccharomyces boulardii  250 mg Oral BID  . sodium chloride  3 mL Intravenous Q12H  . vancomycin  1,000 mg Intravenous Q12H   Continuous Infusions: . sodium chloride 500 mL (08/12/13 1552)    Active Problems:   PROSTATE CANCER   COPD (chronic obstructive pulmonary disease) with emphysema   Hypothyroidism   Acute-on-chronic respiratory failure   Hyponatremia   HCAP (healthcare-associated pneumonia)/ Probable necrotizing PNA    Time spent:   WOODS, CURTIS, J  [facility] Pager [phone]. If 7PM-7AM, please contact night-coverage at [email or web address], password [facility] : [facility] 08/14/2013, 12:47 PM  LOS: 5 days

## 2013-08-15 ENCOUNTER — Inpatient Hospital Stay (HOSPITAL_COMMUNITY): Payer: Medicare Other

## 2013-08-15 LAB — CBC WITH DIFFERENTIAL/PLATELET
Basophils Absolute: 0 10*3/uL (ref 0.0–0.1)
Basophils Relative: 0 % (ref 0–1)
Eosinophils Absolute: 0.1 10*3/uL (ref 0.0–0.7)
Eosinophils Relative: 0 % (ref 0–5)
HCT: 36.6 % — ABNORMAL LOW (ref 39.0–52.0)
Hemoglobin: 12.1 g/dL — ABNORMAL LOW (ref 13.0–17.0)
Lymphocytes Relative: 34 % (ref 12–46)
Lymphs Abs: 4.3 10*3/uL — ABNORMAL HIGH (ref 0.7–4.0)
MCH: 27.4 pg (ref 26.0–34.0)
MCHC: 33.1 g/dL (ref 30.0–36.0)
MCV: 82.8 fL (ref 78.0–100.0)
Monocytes Absolute: 1 10*3/uL (ref 0.1–1.0)
Monocytes Relative: 8 % (ref 3–12)
Neutro Abs: 7.5 10*3/uL (ref 1.7–7.7)
Neutrophils Relative %: 58 % (ref 43–77)
Platelets: 520 10*3/uL — ABNORMAL HIGH (ref 150–400)
RBC: 4.42 MIL/uL (ref 4.22–5.81)
RDW: 14.3 % (ref 11.5–15.5)
WBC: 12.8 10*3/uL — ABNORMAL HIGH (ref 4.0–10.5)

## 2013-08-15 LAB — COMPREHENSIVE METABOLIC PANEL
ALT: 33 U/L (ref 0–53)
AST: 19 U/L (ref 0–37)
Albumin: 2.9 g/dL — ABNORMAL LOW (ref 3.5–5.2)
Alkaline Phosphatase: 64 U/L (ref 39–117)
BUN: 18 mg/dL (ref 6–23)
CO2: 28 mEq/L (ref 19–32)
Calcium: 9.3 mg/dL (ref 8.4–10.5)
Chloride: 96 mEq/L (ref 96–112)
Creatinine, Ser: 1.06 mg/dL (ref 0.50–1.35)
GFR calc Af Amer: 73 mL/min — ABNORMAL LOW (ref >90)
GFR calc non Af Amer: 63 mL/min — ABNORMAL LOW (ref >90)
Glucose, Bld: 101 mg/dL — ABNORMAL HIGH (ref 70–99)
Potassium: 4.1 mEq/L (ref 3.5–5.1)
Sodium: 135 mEq/L (ref 135–145)
Total Bilirubin: 0.2 mg/dL — ABNORMAL LOW (ref 0.3–1.2)
Total Protein: 6.9 g/dL (ref 6.0–8.3)

## 2013-08-15 IMAGING — CR DG CHEST 1V PORT
1 series · 1 of 1 positions shown · non-contrast
Comparison: [DATE]

CLINICAL DATA: Follow-up pneumonia.  Emphysema.

EXAM:
PORTABLE CHEST - 1 VIEW

[AP]
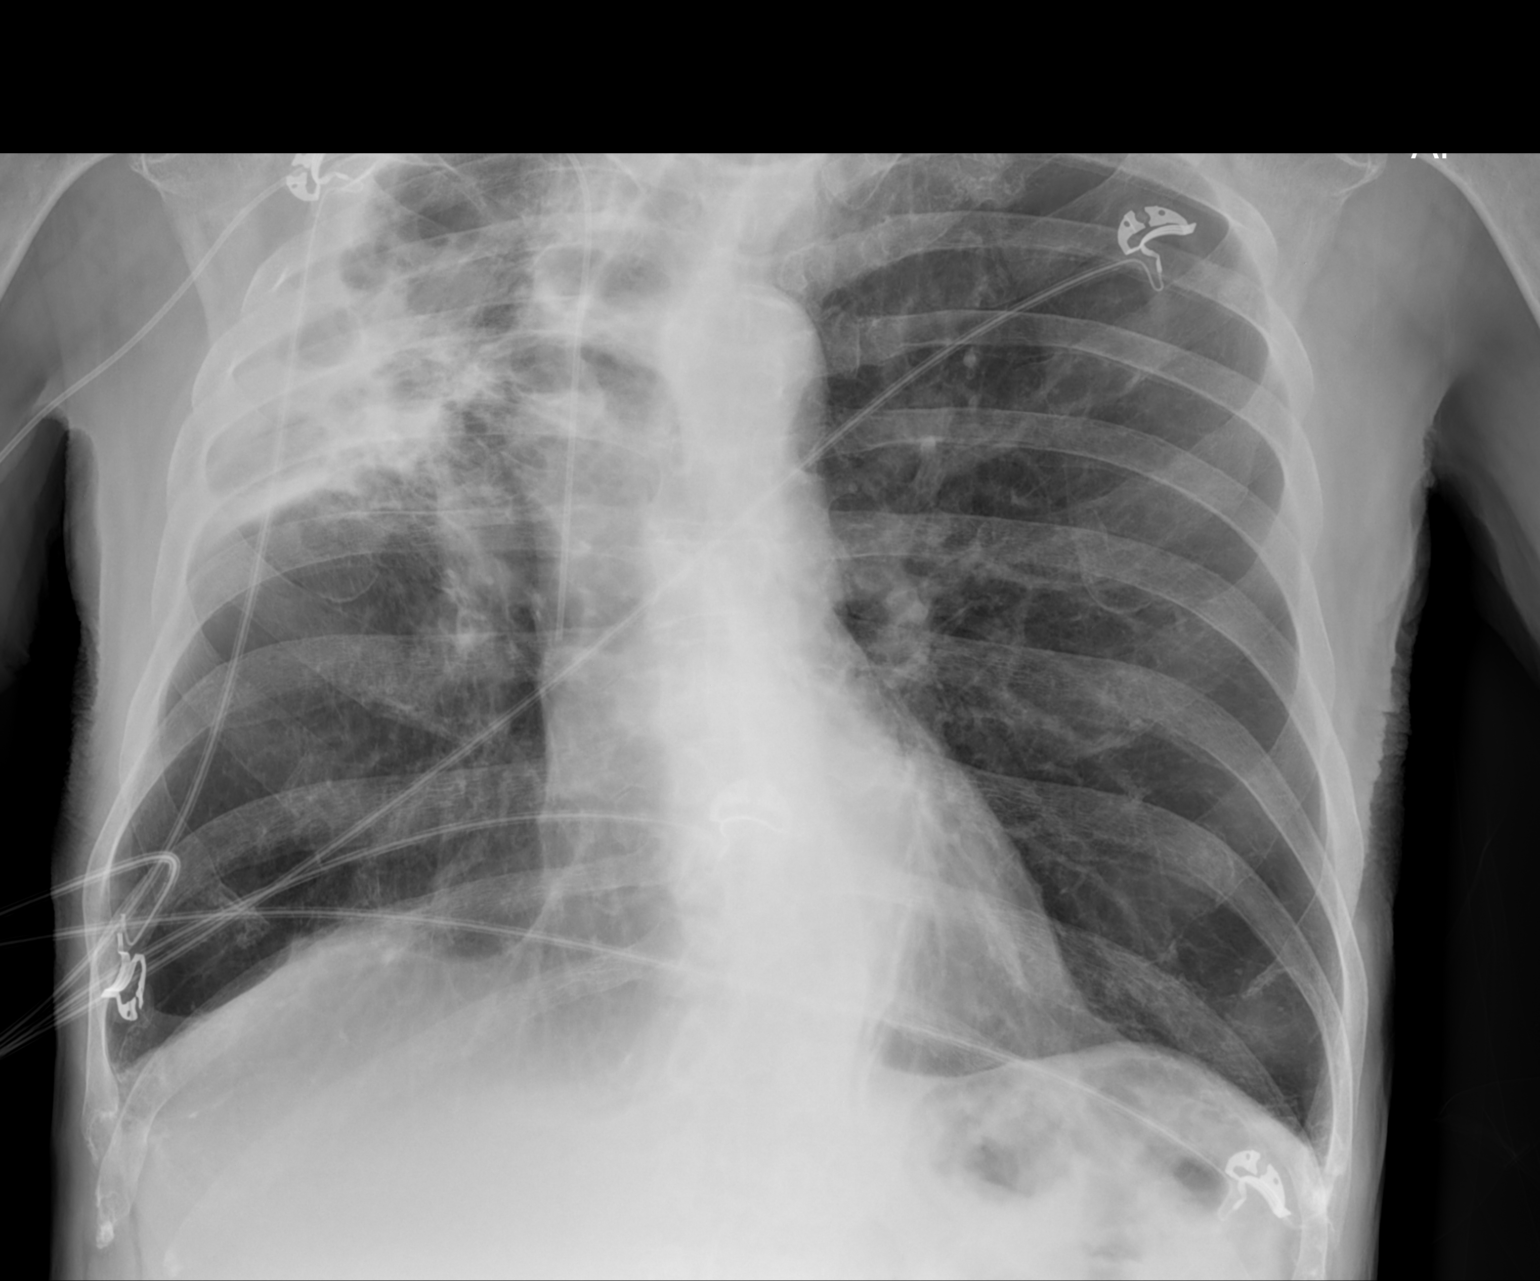

[1 of 1 positions shown; findings below may reference images not displayed]

FINDINGS: PICC line tip in the lower SVC. No change in the patchy parenchymal
densities in the right upper chest. Again noted is lucency
throughout the left lung consistent with emphysema. Stable
appearance of the heart and mediastinum.
IMPRESSION: No change in the patchy parenchymal disease in the right upper lung.

Emphysema.

## 2013-08-15 MED ORDER — DIPHENHYDRAMINE HCL 25 MG PO CAPS
25.0000 mg | ORAL_CAPSULE | Freq: Once | ORAL | Status: AC
  Administered 2013-08-15: 25 mg via ORAL
  Filled 2013-08-15: qty 1

## 2013-08-15 NOTE — Progress Notes (Signed)
[facility] PROGRESS NOTE  John Black ZOX:[id number] DOB: 08/07/1929 DOA: 08/09/2013 PCP: Milinda Antis, MD  Assessment/Plan: Acute on chronic respiratory failure  -Improved now on O2 currently at 4LO2 which is patient's home regimen. Per nursing able to ambulate around the ward approximately 400 feet without dropping SpO2. Therefore will transfer out of step down  - Although sputum cultures have been negative will continue the vancomycin and Zosyn NOTE phone consult with Dr. Judyann Munson (infectious disease) patient should complete 21 days antibiotics.  -Sputum cultures (rare Candida albicans). -Patient ambulates hallway on increase O2 to 5 L, but is able to walk without difficulty SpO2 dropping significant.  -Spoken with CSW Marchelle Folks [phone] patient and family have indicated he will need SNF i/o complete full course of antibiotics. [facility] M. will need to agree to discharge to SNF -Counseled could not DC bed/seat alarm. Will D/C Cardiac Telemetry  Necrotizing PNA;  -continue antibiotics per pulmonology -Continue Prednisone 20 mg daily    COPD  - continue nebulizer and Pulmicort. Continue [place], titrate maintain patient's SpO2 > 93%. - cont Flutter valve during waking hrs   Hypothyroidism  - TSH 2.5 (WNL)  High Troponin; - Pt had only one slightly high troponin, the repeat troponin was within normal limits. Most likely some demand ischemia secondary to patient's tachycardia. Patient's tachycardia improved with addition of Coreg  Tachycardia -Improved with addition of Coreg, however with any sort of exertion heart rate will increase into the 120s to 130s. Secondary to patient's BP unable to increase beta blocker  Pedal edema -TED hose when awake; helping with patient's pedal edema.   Code Status: DNR Family Communication: Family present for discussion of plan of care Disposition Plan: Monday to SNF   Consultants:  Pulmonary(DR Guinevere Ferrari)   Procedures: CXR  08/15/2013 No change in the patchy parenchymal disease in the right upper lung.  Emphysema.   Sputum culture 11/24; rare Candida albicans, (final)  Blood culture 11/23; NGTD (Pending)  Urine Culture negative (Final)  MRSA PCR; Negative  PICC line placed right arm 08/08/2013  CT chest without contrast 08/07/2013 Increase in extent and severity of necrotic right upper lobe  pneumonia. There are now cavitary areas within the consolidative  process concerning for pulmonary abscess.   CXR 08/07/2013 Worsening right upper lobe pneumonitis as described above.  Emphysematous changes are also appreciated with air-fluid levels.  Antibiotics:  Ciprofloxacin 11/24>> 11/24  Zosyn 11/24>>  Vancomycin 11/24>>    HPI/Subjective: John Black is a 79 y.o. WM PMHX hyponatremia, thyroidism, emphysema (on 3 L O2 at home), acute on chronic respiratory failure, male who was recently diagnosed with necrotizing pneumonia has had a CAT scan of his chest yesterday and was found to have worsening of his pneumonia with cavitations. Patient was started on vancomycin Zosyn and Cipro as outpatient after PICC line was placed by patient's pulmonologist. Despite which patient's breathing got worse and was advised to come to the ER. Patient usually uses 4 L of oxygen and presently needs to use 6 L to maintain saturation more than 90% and patient is also using accessory muscles to breathe. Chest x-ray does not show anything worsening. Patient in addition has some chest tightness. Patient's daughter states that earlier today patient had fever with temperatures around 101F. Patient otherwise denies any nausea vomiting abdominal pain diarrhea. 08/10/2013 states he was initially seen at [facility] Pen x 2 over last couple of weeks but his breathing cont. To worsen. States Say his Pulmonologist last wk who admitted him  to [facility] and placed on ABxand sent him hm w/ PICC line and ABX. After one day O2 requirement  continue to Inc and was running fever of 100.6. That's when he decieded to return here last night.08/11/2013 States feeling greatly improved. Now sitting in chair on 4LO2 via [place] which is his home regimen. 08/12/2013 states feeling improved like to know if he can be transferred out of the unit . Negative CP, positive continue SOB when not on nasal cannula. Unsure if he is DOE secondary to not having exerted himself other than moving from bed to chair to bathroom. 08/13/2013 patient states has been ambulating around the ward on nasal cannula 4 L O2 without SOB, dizziness, headache, CP 08/14/2013 in chair comfortably not on O2 able to converse normally. Stated ambulated up and down hallway without significant discomfort however had to increase O2 to 5 L in order to maintain his SpO2> 92%. TODAY patient sitting in chair comfortably, joking with family, joking with medical staff requested that seat/bed alarm be discontinued, that cardiac telemetry be discontinued.     Objective: Filed Vitals:   08/14/13 2052 08/14/13 2057 08/15/13 0517 08/15/13 0822  BP:  129/68 100/47   Pulse:  75 63   Temp:  98.2 F (36.8 C) 98.1 F (36.7 C)   TempSrc:  Oral Oral   Resp:  19 18   Height:      Weight:   55.2 kg (121 lb 11.1 oz)   SpO2: 92% 92% 92% 90%    Intake/Output Summary (Last 24 hours) at 08/15/13 1230 Last data filed at 08/15/13 1130  Gross per 24 hour  Intake   1440 ml  Output   1800 ml  Net   -360 ml   Filed Weights   08/11/13 0600 08/14/13 0428 08/15/13 0517  Weight: 60 kg (132 lb 4.4 oz) 55.9 kg (123 lb 3.8 oz) 55.2 kg (121 lb 11.1 oz)    Exam:   General:  A/O x 4, NAD  Cardiovascular:  RRR (-) M/R/G, DP/PT pulse +2  Respiratory:  Clear to auscultation bilateral   Abdomen: Soft , NT, ND, (+) BS  Musculoskeletal: Right (+) Pedal Edema 1+ to just above the ankle. Patient has TED hose on  Data Reviewed: Basic Metabolic Panel:  Recent Labs Lab 08/09/13 2125 08/10/13 0245  08/12/13 0335 08/13/13 0341 08/15/13 0456  NA 134* 134* 135 134* 135  K 4.1 3.7 4.2 4.0 4.1  CL 99 100 97 94* 96  CO2 27 25 29 30 28   GLUCOSE 114* 124* 116* 96 101*  BUN 20 16 19 23 18   CREATININE 1.02 0.88 0.84 1.00 1.06  CALCIUM 8.9 8.3* 9.0 9.2 9.3  MG  --   --  2.2  --   --   PHOS  --   --  2.5  --   --    Liver Function Tests:  Recent Labs Lab 08/09/13 2125 08/15/13 0456  AST 23 19  ALT 25 33  ALKPHOS 69 64  BILITOT 0.2* 0.2*  PROT 6.4 6.9  ALBUMIN 2.5* 2.9*   No results found for this basename: LIPASE, AMYLASE,  in the last 168 hours No results found for this basename: AMMONIA,  in the last 168 hours CBC:  Recent Labs Lab 08/09/13 2125 08/10/13 0245 08/12/13 0335 08/13/13 0915 08/14/13 0620 08/15/13 0456  WBC 10.0 9.1 9.5 11.5* 11.2* 12.8*  NEUTROABS 7.3  --   --  8.7* 6.8 7.5  HGB 10.6* 9.7* 10.2* 11.0*  11.2* 12.1*  HCT 31.7* 29.8* 30.9* 34.0* 34.4* 36.6*  MCV 82.3 83.0 83.1 84.2 83.7 82.8  PLT 431* 377 462* 461* 478* 520*   Cardiac Enzymes:  Recent Labs Lab 08/10/13 0245 08/10/13 1110 08/10/13 1325 08/10/13 2235  TROPONINI <0.30 <0.30 0.33* <0.30   BNP (last 3 results)  Recent Labs  07/24/13 2004 08/10/13 0245  PROBNP 335.2 888.5*   CBG: No results found for this basename: GLUCAP,  in the last 168 hours  Recent Results (from the past 240 hour(s))  URINE CULTURE     Status: None   Collection Time    08/09/13  9:14 PM      Result Value Range Status   Specimen Description URINE, CLEAN CATCH   Final   Special Requests NONE   Final   Culture  Setup Time     Final   Value: 08/10/2013 03:35     Performed at [facility] Count     Final   Value: NO GROWTH     Performed at [facility]   Culture     Final   Value: NO GROWTH     Performed at [facility]   Report Status 08/10/2013 FINAL   Final  CULTURE, BLOOD (ROUTINE X 2)     Status: None   Collection Time    08/09/13  9:25 PM      Result Value  Range Status   Specimen Description BLOOD LEFT ANTECUBITAL   Final   Special Requests BOTTLES DRAWN AEROBIC AND ANAEROBIC 5CC   Final   Culture  Setup Time     Final   Value: 08/10/2013 02:53     Performed at [facility]   Culture     Final   Value:        BLOOD CULTURE RECEIVED NO GROWTH TO DATE CULTURE WILL BE HELD FOR 5 DAYS BEFORE ISSUING A FINAL NEGATIVE REPORT     Performed at [facility]   Report Status PENDING   Incomplete  CULTURE, BLOOD (ROUTINE X 2)     Status: None   Collection Time    08/09/13  9:25 PM      Result Value Range Status   Specimen Description BLOOD RIGHT ANTECUBITAL   Final   Special Requests BOTTLES DRAWN AEROBIC AND ANAEROBIC 5CC   Final   Culture  Setup Time     Final   Value: 08/10/2013 02:53     Performed at [facility]   Culture     Final   Value:        BLOOD CULTURE RECEIVED NO GROWTH TO DATE CULTURE WILL BE HELD FOR 5 DAYS BEFORE ISSUING A FINAL NEGATIVE REPORT     Performed at [facility]   Report Status PENDING   Incomplete  MRSA PCR SCREENING     Status: None   Collection Time    08/10/13  1:58 AM      Result Value Range Status   MRSA by PCR NEGATIVE  NEGATIVE Final   Comment:            The GeneXpert MRSA Assay (FDA     approved for NASAL specimens     only), is one component of a     comprehensive MRSA colonization     surveillance program. It is not     intended to diagnose MRSA     infection nor to guide or  monitor treatment for     MRSA infections.  CULTURE, EXPECTORATED SPUTUM-ASSESSMENT     Status: None   Collection Time    08/10/13  4:27 AM      Result Value Range Status   Specimen Description SPUTUM   Final   Special Requests Normal   Final   Sputum evaluation     Final   Value: THIS SPECIMEN IS ACCEPTABLE. RESPIRATORY CULTURE REPORT TO FOLLOW.   Report Status 08/10/2013 FINAL   Final  CULTURE, RESPIRATORY (NON-EXPECTORATED)     Status: None   Collection Time    08/10/13  4:27 AM       Result Value Range Status   Specimen Description SPUTUM   Final   Special Requests NONE   Final   Gram Stain     Final   Value: RARE WBC PRESENT,BOTH PMN AND MONONUCLEAR     RARE SQUAMOUS EPITHELIAL CELLS PRESENT     NO ORGANISMS SEEN     Performed at [facility]   Culture     Final   Value: RARE CANDIDA ALBICANS     Performed at [facility]   Report Status 08/12/2013 FINAL   Final     Studies: Dg Chest Port 1 View  08/15/2013   CLINICAL DATA:  Follow-up pneumonia.  Emphysema.  EXAM: PORTABLE CHEST - 1 VIEW  COMPARISON:  08/12/2013  FINDINGS: PICC line tip in the lower SVC. No change in the patchy parenchymal densities in the right upper chest. Again noted is lucency throughout the left lung consistent with emphysema. Stable appearance of the heart and mediastinum.  IMPRESSION: No change in the patchy parenchymal disease in the right upper lung.  Emphysema.   Electronically Signed   By: Richarda Overlie M.D.   On: 08/15/2013 08:11    Scheduled Meds: . budesonide  0.25 mg Nebulization Q6H  . carvedilol  3.125 mg Oral BID WC  . doxazosin  8 mg Oral QHS  . enoxaparin (LOVENOX) injection  40 mg Subcutaneous Q24H  . feeding supplement (ENSURE COMPLETE)  237 mL Oral BID BM  . ipratropium  0.5 mg Nebulization Q6H  . levalbuterol  0.63 mg Nebulization Q6H  . levothyroxine  75 mcg Oral QAC breakfast  . multivitamin with minerals  1 tablet Oral q morning - 10a  . oxyCODONE-acetaminophen  1 tablet Oral QHS  . pantoprazole  40 mg Oral Daily  . piperacillin-tazobactam (ZOSYN)  IV  3.375 g Intravenous Q8H  . predniSONE  20 mg Oral Q breakfast  . saccharomyces boulardii  250 mg Oral BID  . sodium chloride  3 mL Intravenous Q12H  . vancomycin  1,000 mg Intravenous Q12H   Continuous Infusions: . sodium chloride 500 mL (08/12/13 1552)    Active Problems:   PROSTATE CANCER   COPD (chronic obstructive pulmonary disease) with emphysema   Hypothyroidism   Acute-on-chronic  respiratory failure   Hyponatremia   HCAP (healthcare-associated pneumonia)/ Probable necrotizing PNA    Time spent:   Ruairi Stutsman, J  [facility] Pager [phone]. If 7PM-7AM, please contact night-coverage at [email or web address], password [facility] 08/15/2013, 12:30 PM  LOS: 6 days

## 2013-08-16 LAB — CBC WITH DIFFERENTIAL/PLATELET
Basophils Absolute: 0 10*3/uL (ref 0.0–0.1)
Basophils Relative: 0 % (ref 0–1)
Eosinophils Absolute: 0.1 10*3/uL (ref 0.0–0.7)
Eosinophils Relative: 1 % (ref 0–5)
HCT: 34.1 % — ABNORMAL LOW (ref 39.0–52.0)
Hemoglobin: 11.2 g/dL — ABNORMAL LOW (ref 13.0–17.0)
Lymphocytes Relative: 28 % (ref 12–46)
Lymphs Abs: 3.2 10*3/uL (ref 0.7–4.0)
MCH: 27.2 pg (ref 26.0–34.0)
MCHC: 32.8 g/dL (ref 30.0–36.0)
MCV: 82.8 fL (ref 78.0–100.0)
Monocytes Absolute: 0.9 10*3/uL (ref 0.1–1.0)
Monocytes Relative: 8 % (ref 3–12)
Neutro Abs: 7.3 10*3/uL (ref 1.7–7.7)
Neutrophils Relative %: 63 % (ref 43–77)
Platelets: 447 10*3/uL — ABNORMAL HIGH (ref 150–400)
RBC: 4.12 MIL/uL — ABNORMAL LOW (ref 4.22–5.81)
RDW: 14.5 % (ref 11.5–15.5)
WBC: 11.5 10*3/uL — ABNORMAL HIGH (ref 4.0–10.5)

## 2013-08-16 LAB — CULTURE, BLOOD (ROUTINE X 2)
Culture: NO GROWTH
Culture: NO GROWTH

## 2013-08-16 LAB — COMPREHENSIVE METABOLIC PANEL
ALT: 29 U/L (ref 0–53)
AST: 17 U/L (ref 0–37)
Albumin: 2.5 g/dL — ABNORMAL LOW (ref 3.5–5.2)
Alkaline Phosphatase: 56 U/L (ref 39–117)
BUN: 18 mg/dL (ref 6–23)
CO2: 29 mEq/L (ref 19–32)
Calcium: 9.1 mg/dL (ref 8.4–10.5)
Chloride: 97 mEq/L (ref 96–112)
Creatinine, Ser: 0.89 mg/dL (ref 0.50–1.35)
GFR calc Af Amer: 90 mL/min — ABNORMAL LOW (ref >90)
GFR calc non Af Amer: 77 mL/min — ABNORMAL LOW (ref >90)
Glucose, Bld: 90 mg/dL (ref 70–99)
Potassium: 3.6 mEq/L (ref 3.5–5.1)
Sodium: 135 mEq/L (ref 135–145)
Total Bilirubin: 0.2 mg/dL — ABNORMAL LOW (ref 0.3–1.2)
Total Protein: 6 g/dL (ref 6.0–8.3)

## 2013-08-16 LAB — VANCOMYCIN, TROUGH: Vancomycin Tr: 19.1 ug/mL (ref 10.0–20.0)

## 2013-08-16 MED ORDER — AMOXICILLIN-POT CLAVULANATE 875-125 MG PO TABS
1.0000 | ORAL_TABLET | Freq: Two times a day (BID) | ORAL | Status: DC
  Administered 2013-08-17: 1 via ORAL
  Filled 2013-08-16 (×3): qty 1

## 2013-08-16 NOTE — Progress Notes (Signed)
Patient ambulated in hallway >200 feet. Patient tolerated well. Mild shortness of breath. Oxygen saturation 91% on 4L nasal cannula. Will continue to monitor patient. Setzer, Don Broach

## 2013-08-16 NOTE — Progress Notes (Signed)
[facility] PROGRESS NOTE  John Black ZOX:[id number] DOB: Jun 14, 1929 DOA: 08/09/2013 PCP: Milinda Antis, MD  Assessment/Plan: Acute on chronic respiratory failure  -Improved now on O2 currently at 4LO2 which is patient's home regimen. Per nursing able to ambulate around the ward approximately 400 feet without dropping SpO2. Therefore will transfer out of step down  - Although sputum cultures have been negative will continue the vancomycin and Zosyn NOTE phone consult with Dr. Judyann Munson (infectious disease) patient should complete 21 days antibiotics.  -Sputum cultures (rare Candida albicans). -Patient ambulates hallway without need for increased cough O2 now on 4 L O2 24 hour per day regardless of activity.  -Spoken with CSW Marchelle Folks [phone] patient and family have indicated he will need SNF i/o complete full course of antibiotics. Patient and family have agreed on  [facility]. Will await PCCM. Input on total duration of IV antibiotic in need of any follow on oral antibiotic, before discharge to SNF  Necrotizing PNA;  -continue antibiotics per pulmonology -Continue Prednisone 20 mg daily    COPD  - continue nebulizer and Pulmicort. Continue [place], titrate maintain patient's SpO2 > 93%. - cont Flutter valve during waking hrs   Hypothyroidism  - TSH 2.5 (WNL)  High Troponin; - Pt had only one slightly high troponin, the repeat troponin was within normal limits. Most likely some demand ischemia secondary to patient's tachycardia. Patient's tachycardia improved with addition of Coreg  Tachycardia -Improved with addition of Coreg. Patient tolerating well   Pedal edema -TED hose when awake; helping with patient's pedal edema.   Code Status: DNR Family Communication: Family present for discussion of plan of care Disposition Plan: Monday to SNF   Consultants:  Pulmonary(DR Guinevere Ferrari)   Procedures: CXR 08/15/2013 No change in the patchy parenchymal disease  in the right upper lung.  Emphysema.   Sputum culture 11/24; rare Candida albicans, (final)  Blood culture 11/23; NGTD (Final)  Urine Culture negative (Final)  MRSA PCR; Negative  PICC line placed right arm 08/08/2013  CT chest without contrast 08/07/2013 Increase in extent and severity of necrotic right upper lobe  pneumonia. There are now cavitary areas within the consolidative  process concerning for pulmonary abscess.   CXR 08/07/2013 Worsening right upper lobe pneumonitis as described above.  Emphysematous changes are also appreciated with air-fluid levels.  Antibiotics:  Ciprofloxacin 11/24>> 11/24  Zosyn 11/24>>  Vancomycin 11/24>>    HPI/Subjective: John Black is a 79 y.o. WM PMHX hyponatremia, thyroidism, emphysema (on 3 L O2 at home), acute on chronic respiratory failure, male who was recently diagnosed with necrotizing pneumonia has had a CAT scan of his chest yesterday and was found to have worsening of his pneumonia with cavitations. Patient was started on vancomycin Zosyn and Cipro as outpatient after PICC line was placed by patient's pulmonologist. Despite which patient's breathing got worse and was advised to come to the ER. Patient usually uses 4 L of oxygen and presently needs to use 6 L to maintain saturation more than 90% and patient is also using accessory muscles to breathe. Chest x-ray does not show anything worsening. Patient in addition has some chest tightness. Patient's daughter states that earlier today patient had fever with temperatures around 101F. Patient otherwise denies any nausea vomiting abdominal pain diarrhea. 08/10/2013 states he was initially seen at [facility] Pen x 2 over last couple of weeks but his breathing cont. To worsen. States Say his Pulmonologist last wk who admitted him to [facility] and placed on  ABxand sent him hm w/ PICC line and ABX. After one day O2 requirement continue to Inc and was running fever of 100.6. That's when he  decieded to return here last night.08/11/2013 States feeling greatly improved. Now sitting in chair on 4LO2 via [place] which is his home regimen. 08/12/2013 states feeling improved like to know if he can be transferred out of the unit . Negative CP, positive continue SOB when not on nasal cannula. Unsure if he is DOE secondary to not having exerted himself other than moving from bed to chair to bathroom. 08/13/2013 patient states has been ambulating around the ward on nasal cannula 4 L O2 without SOB, dizziness, headache, CP 08/14/2013 in chair comfortably not on O2 able to converse normally. Stated ambulated up and down hallway without significant discomfort however had to increase O2 to 5 L in order to maintain his SpO2> 92%. 08/15/2013 patient sitting in chair comfortably, joking with family, joking with medical staff requested that seat/bed alarm be discontinued, that cardiac telemetry be discontinued. Today patient states continues to improve ambulated all the way to [facility] wing the hospital on 4 L O2 via [place] and SpO2 did not drop lower than 91%; negative SOB, negative CP, negative N./V.      Objective: Filed Vitals:   08/15/13 2137 08/16/13 0509 08/16/13 0739 08/16/13 0809  BP: 96/40 102/57 102/55   Pulse: 81 84 102   Temp: 98.6 F (37 C) 98.3 F (36.8 C)    TempSrc: Oral Oral    Resp: 20 18    Height:      Weight:  55.929 kg (123 lb 4.8 oz)    SpO2: 93% 94%  93%    Intake/Output Summary (Last 24 hours) at 08/16/13 1211 Last data filed at 08/16/13 1030  Gross per 24 hour  Intake   1860 ml  Output   1925 ml  Net    -65 ml   Filed Weights   08/14/13 0428 08/15/13 0517 08/16/13 0509  Weight: 55.9 kg (123 lb 3.8 oz) 55.2 kg (121 lb 11.1 oz) 55.929 kg (123 lb 4.8 oz)    Exam:   General:  A/O x 4, NAD  Cardiovascular:  RRR (-) M/R/G, DP/PT pulse +2  Respiratory:  Clear to auscultation bilateral   Abdomen: Soft , NT, ND, (+) BS  Musculoskeletal: Right (+) Pedal Edema 1+ to just  above the ankle. Patient has TED hose on  Data Reviewed: Basic Metabolic Panel:  Recent Labs Lab 08/10/13 0245 08/12/13 0335 08/13/13 0341 08/15/13 0456 08/16/13 0530  NA 134* 135 134* 135 135  K 3.7 4.2 4.0 4.1 3.6  CL 100 97 94* 96 97  CO2 25 29 30 28 29   GLUCOSE 124* 116* 96 101* 90  BUN 16 19 23 18 18   CREATININE 0.88 0.84 1.00 1.06 0.89  CALCIUM 8.3* 9.0 9.2 9.3 9.1  MG  --  2.2  --   --   --   PHOS  --  2.5  --   --   --    Liver Function Tests:  Recent Labs Lab 08/09/13 2125 08/15/13 0456 08/16/13 0530  AST 23 19 17   ALT 25 33 29  ALKPHOS 69 64 56  BILITOT 0.2* 0.2* 0.2*  PROT 6.4 6.9 6.0  ALBUMIN 2.5* 2.9* 2.5*   No results found for this basename: LIPASE, AMYLASE,  in the last 168 hours No results found for this basename: AMMONIA,  in the last 168 hours CBC:  Recent Labs  Lab 08/09/13 2125  08/12/13 0335 08/13/13 0915 08/14/13 0620 08/15/13 0456 08/16/13 0530  WBC 10.0  < > 9.5 11.5* 11.2* 12.8* 11.5*  NEUTROABS 7.3  --   --  8.7* 6.8 7.5 7.3  HGB 10.6*  < > 10.2* 11.0* 11.2* 12.1* 11.2*  HCT 31.7*  < > 30.9* 34.0* 34.4* 36.6* 34.1*  MCV 82.3  < > 83.1 84.2 83.7 82.8 82.8  PLT 431*  < > 462* 461* 478* 520* 447*  < > = values in this interval not displayed. Cardiac Enzymes:  Recent Labs Lab 08/10/13 0245 08/10/13 1110 08/10/13 1325 08/10/13 2235  TROPONINI <0.30 <0.30 0.33* <0.30   BNP (last 3 results)  Recent Labs  07/24/13 2004 08/10/13 0245  PROBNP 335.2 888.5*   CBG: No results found for this basename: GLUCAP,  in the last 168 hours  Recent Results (from the past 240 hour(s))  URINE CULTURE     Status: None   Collection Time    08/09/13  9:14 PM      Result Value Range Status   Specimen Description URINE, CLEAN CATCH   Final   Special Requests NONE   Final   Culture  Setup Time     Final   Value: 08/10/2013 03:35     Performed at [facility] Count     Final   Value: NO GROWTH     Performed at [facility]   Culture     Final   Value: NO GROWTH     Performed at [facility]   Report Status 08/10/2013 FINAL   Final  CULTURE, BLOOD (ROUTINE X 2)     Status: None   Collection Time    08/09/13  9:25 PM      Result Value Range Status   Specimen Description BLOOD LEFT ANTECUBITAL   Final   Special Requests BOTTLES DRAWN AEROBIC AND ANAEROBIC 5CC   Final   Culture  Setup Time     Final   Value: 08/10/2013 02:53     Performed at [facility]   Culture     Final   Value: NO GROWTH 5 DAYS     Performed at [facility]   Report Status 08/16/2013 FINAL   Final  CULTURE, BLOOD (ROUTINE X 2)     Status: None   Collection Time    08/09/13  9:25 PM      Result Value Range Status   Specimen Description BLOOD RIGHT ANTECUBITAL   Final   Special Requests BOTTLES DRAWN AEROBIC AND ANAEROBIC 5CC   Final   Culture  Setup Time     Final   Value: 08/10/2013 02:53     Performed at [facility]   Culture     Final   Value: NO GROWTH 5 DAYS     Performed at [facility]   Report Status 08/16/2013 FINAL   Final  MRSA PCR SCREENING     Status: None   Collection Time    08/10/13  1:58 AM      Result Value Range Status   MRSA by PCR NEGATIVE  NEGATIVE Final   Comment:            The GeneXpert MRSA Assay (FDA     approved for NASAL specimens     only), is one component of a     comprehensive MRSA colonization     surveillance program. It is not  intended to diagnose MRSA     infection nor to guide or     monitor treatment for     MRSA infections.  CULTURE, EXPECTORATED SPUTUM-ASSESSMENT     Status: None   Collection Time    08/10/13  4:27 AM      Result Value Range Status   Specimen Description SPUTUM   Final   Special Requests Normal   Final   Sputum evaluation     Final   Value: THIS SPECIMEN IS ACCEPTABLE. RESPIRATORY CULTURE REPORT TO FOLLOW.   Report Status 08/10/2013 FINAL   Final  CULTURE, RESPIRATORY (NON-EXPECTORATED)     Status:  None   Collection Time    08/10/13  4:27 AM      Result Value Range Status   Specimen Description SPUTUM   Final   Special Requests NONE   Final   Gram Stain     Final   Value: RARE WBC PRESENT,BOTH PMN AND MONONUCLEAR     RARE SQUAMOUS EPITHELIAL CELLS PRESENT     NO ORGANISMS SEEN     Performed at [facility]   Culture     Final   Value: RARE CANDIDA ALBICANS     Performed at [facility]   Report Status 08/12/2013 FINAL   Final     Studies: Dg Chest Port 1 View  08/15/2013   CLINICAL DATA:  Follow-up pneumonia.  Emphysema.  EXAM: PORTABLE CHEST - 1 VIEW  COMPARISON:  08/12/2013  FINDINGS: PICC line tip in the lower SVC. No change in the patchy parenchymal densities in the right upper chest. Again noted is lucency throughout the left lung consistent with emphysema. Stable appearance of the heart and mediastinum.  IMPRESSION: No change in the patchy parenchymal disease in the right upper lung.  Emphysema.   Electronically Signed   By: Richarda Overlie M.D.   On: 08/15/2013 08:11    Scheduled Meds: . budesonide  0.25 mg Nebulization Q6H  . carvedilol  3.125 mg Oral BID WC  . doxazosin  8 mg Oral QHS  . enoxaparin (LOVENOX) injection  40 mg Subcutaneous Q24H  . feeding supplement (ENSURE COMPLETE)  237 mL Oral BID BM  . ipratropium  0.5 mg Nebulization Q6H  . levalbuterol  0.63 mg Nebulization Q6H  . levothyroxine  75 mcg Oral QAC breakfast  . multivitamin with minerals  1 tablet Oral q morning - 10a  . oxyCODONE-acetaminophen  1 tablet Oral QHS  . pantoprazole  40 mg Oral Daily  . piperacillin-tazobactam (ZOSYN)  IV  3.375 g Intravenous Q8H  . predniSONE  20 mg Oral Q breakfast  . saccharomyces boulardii  250 mg Oral BID  . sodium chloride  3 mL Intravenous Q12H  . vancomycin  1,000 mg Intravenous Q12H   Continuous Infusions: . sodium chloride 500 mL (08/12/13 1552)    Active Problems:   PROSTATE CANCER   COPD (chronic obstructive pulmonary disease) with  emphysema   Hypothyroidism   Acute-on-chronic respiratory failure   Hyponatremia   HCAP (healthcare-associated pneumonia)/ Probable necrotizing PNA    Time spent:   WOODS, CURTIS, J  [facility] Pager [phone]. If 7PM-7AM, please contact night-coverage at [email or web address], password [facility] 08/16/2013, 12:11 PM  LOS: 7 days

## 2013-08-16 NOTE — Progress Notes (Signed)
[facility]    Date of Admission:  08/09/2013   Total days of antibiotics 8        Day 8 piptazo        Day 8 vanco           ID: John Black is a 78 y.o. male with necrotizing PNA.  Active Problems:   PROSTATE CANCER   COPD (chronic obstructive pulmonary disease) with emphysema   Hypothyroidism   Acute-on-chronic respiratory failure   Hyponatremia   HCAP (healthcare-associated pneumonia)/ Probable necrotizing PNA    Subjective: Improved shortness for breath on 4L , able to ambulate  Medications:  . budesonide  0.25 mg Nebulization Q6H  . carvedilol  3.125 mg Oral BID WC  . doxazosin  8 mg Oral QHS  . enoxaparin (LOVENOX) injection  40 mg Subcutaneous Q24H  . feeding supplement (ENSURE COMPLETE)  237 mL Oral BID BM  . ipratropium  0.5 mg Nebulization Q6H  . levalbuterol  0.63 mg Nebulization Q6H  . levothyroxine  75 mcg Oral QAC breakfast  . multivitamin with minerals  1 tablet Oral q morning - 10a  . oxyCODONE-acetaminophen  1 tablet Oral QHS  . pantoprazole  40 mg Oral Daily  . piperacillin-tazobactam (ZOSYN)  IV  3.375 g Intravenous Q8H  . predniSONE  20 mg Oral Q breakfast  . saccharomyces boulardii  250 mg Oral BID  . sodium chloride  3 mL Intravenous Q12H  . vancomycin  1,000 mg Intravenous Q12H    Objective: Vital signs in last 24 hours: Temp:  [98 F (36.7 C)-98.6 F (37 C)] 98 F (36.7 C) (11/30 1505) Pulse Rate:  [81-102] 85 (11/30 1505) Resp:  [18-20] 20 (11/30 1505) BP: (96-102)/(40-67) 98/67 mmHg (11/30 1505) SpO2:  [93 %-95 %] 95 % (11/30 1505) Weight:  [123 lb 4.8 oz (55.929 kg)] 123 lb 4.8 oz (55.929 kg) (11/30 0509) General: alert, oriented, elderly male in NAD  ENT: Oropharynx clear. Dry mucous membranes. No thrush  Lymph: No cervical, supraclavicular, or axillary lymphadenopathy.  Heart: Normal S1, S2. No murmurs, rubs, or gallops appreciated. No bruits, equal pulses.PVC's per monitor  Lungs: Right lung  with few scattered crackles. Few  expiratory wheezes on R.   Lab Results  Recent Labs  08/15/13 0456 08/16/13 0530  WBC 12.8* 11.5*  HGB 12.1* 11.2*  HCT 36.6* 34.1*  NA 135 135  K 4.1 3.6  CL 96 97  CO2 28 29  BUN 18 18  CREATININE 1.06 0.89    Microbiology: 11/24 expectorant cx: few c.albicans  Studies/Results: Dg Chest Port 1 View  08/15/2013   CLINICAL DATA:  Follow-up pneumonia.  Emphysema.  EXAM: PORTABLE CHEST - 1 VIEW  COMPARISON:  08/12/2013  FINDINGS: PICC line tip in the lower SVC. No change in the patchy parenchymal densities in the right upper chest. Again noted is lucency throughout the left lung consistent with emphysema. Stable appearance of the heart and mediastinum.  IMPRESSION: No change in the patchy parenchymal disease in the right upper lung.  Emphysema.   Electronically Signed   By: Richarda Overlie M.D.   On: 08/15/2013 08:11     Assessment/Plan: plan is unchanged 79 yo M with necrotizing pneumonia = continue with piptazo and vancomycin for now. For discharge, place on vancomycin for a total of 21day including 8 days he has received here in the hospital. Can discontinue piptazo and switch to augmentin 875mg  BID for addn 2 wks. - we  will see back in ID clinic in 2 wks  - will sign off, call if questions  Aram Beecham B. Drue Second MD MPH [facility] [phone]  08/16/2013, 4:53 PM

## 2013-08-16 NOTE — Progress Notes (Signed)
Pt and family still agreeable to SNF and are interested in [facility].  As of this writing, [facility] has yet to respond.    Weekday CSW to follow up with [facility].  Providence Crosby, LCSWA Clinical Social Work [phone]

## 2013-08-16 NOTE — Progress Notes (Signed)
ANTIBIOTIC CONSULT NOTE - FOLLOW UP  Pharmacy Consult for Vancomycin Indication: Necrotizing Pneumonia   Allergies  Allergen Reactions  . Morphine     REACTION: sweats    Patient Measurements: Height: 5\' 6"  (167.6 cm) Weight: 123 lb 4.8 oz (55.929 kg) IBW/kg (Calculated) : 63.8 Adjusted Body Weight:   Vital Signs: Temp: 98.3 F (36.8 C) (11/30 0509) Temp src: Oral (11/30 0509) BP: 102/57 mmHg (11/30 0509) Pulse Rate: 84 (11/30 0509) Intake/Output from previous day: 11/29 0701 - 11/30 0700 In: 1500 [P.O.:720; I.V.:480; IV Piggyback:300] Out: 2050 [Urine:2050] Intake/Output from this shift: Total I/O In: 260 [I.V.:160; IV Piggyback:100] Out: 650 [Urine:650]  Labs:  Recent Labs  08/14/13 0620 08/15/13 0456 08/16/13 0530  WBC 11.2* 12.8* 11.5*  HGB 11.2* 12.1* 11.2*  PLT 478* 520* 447*  CREATININE  --  1.06 0.89   Estimated Creatinine Clearance: 50.6 ml/min (by C-G formula based on Cr of 0.89).  Recent Labs  08/16/13 0530  VANCOTROUGH 19.1     Microbiology: Recent Results (from the past 720 hour(s))  CULTURE, BLOOD (SINGLE)     Status: None   Collection Time    07/17/13  4:17 PM      Result Value Range Status   Organism ID, Bacteria NO GROWTH 5 DAYS   Final  CULTURE, BLOOD (ROUTINE X 2)     Status: None   Collection Time    07/24/13  7:30 PM      Result Value Range Status   Specimen Description BLOOD LEFT HAND   Final   Special Requests BOTTLES DRAWN AEROBIC AND ANAEROBIC 10CC   Final   Culture  Setup Time     Final   Value: 07/25/2013 01:38     Performed at [facility]   Culture     Final   Value: NO GROWTH 5 DAYS     Performed at [facility]   Report Status 07/31/2013 FINAL   Final  CULTURE, BLOOD (ROUTINE X 2)     Status: None   Collection Time    07/24/13  7:50 PM      Result Value Range Status   Specimen Description BLOOD LEFT ARM   Final   Special Requests BOTTLES DRAWN AEROBIC AND ANAEROBIC 10CC   Final   Culture   Setup Time     Final   Value: 07/25/2013 01:38     Performed at [facility]   Culture     Final   Value: NO GROWTH 5 DAYS     Performed at [facility]   Report Status 07/31/2013 FINAL   Final  URINE CULTURE     Status: None   Collection Time    08/09/13  9:14 PM      Result Value Range Status   Specimen Description URINE, CLEAN CATCH   Final   Special Requests NONE   Final   Culture  Setup Time     Final   Value: 08/10/2013 03:35     Performed at [facility] Count     Final   Value: NO GROWTH     Performed at [facility]   Culture     Final   Value: NO GROWTH     Performed at [facility]   Report Status 08/10/2013 FINAL   Final  CULTURE, BLOOD (ROUTINE X 2)     Status: None   Collection Time    08/09/13  9:25 PM  Result Value Range Status   Specimen Description BLOOD LEFT ANTECUBITAL   Final   Special Requests BOTTLES DRAWN AEROBIC AND ANAEROBIC 5CC   Final   Culture  Setup Time     Final   Value: 08/10/2013 02:53     Performed at [facility]   Culture     Final   Value:        BLOOD CULTURE RECEIVED NO GROWTH TO DATE CULTURE WILL BE HELD FOR 5 DAYS BEFORE ISSUING A FINAL NEGATIVE REPORT     Performed at [facility]   Report Status PENDING   Incomplete  CULTURE, BLOOD (ROUTINE X 2)     Status: None   Collection Time    08/09/13  9:25 PM      Result Value Range Status   Specimen Description BLOOD RIGHT ANTECUBITAL   Final   Special Requests BOTTLES DRAWN AEROBIC AND ANAEROBIC 5CC   Final   Culture  Setup Time     Final   Value: 08/10/2013 02:53     Performed at [facility]   Culture     Final   Value:        BLOOD CULTURE RECEIVED NO GROWTH TO DATE CULTURE WILL BE HELD FOR 5 DAYS BEFORE ISSUING A FINAL NEGATIVE REPORT     Performed at [facility]   Report Status PENDING   Incomplete  MRSA PCR SCREENING     Status: None   Collection Time    08/10/13  1:58 AM      Result  Value Range Status   MRSA by PCR NEGATIVE  NEGATIVE Final   Comment:            The GeneXpert MRSA Assay (FDA     approved for NASAL specimens     only), is one component of a     comprehensive MRSA colonization     surveillance program. It is not     intended to diagnose MRSA     infection nor to guide or     monitor treatment for     MRSA infections.  CULTURE, EXPECTORATED SPUTUM-ASSESSMENT     Status: None   Collection Time    08/10/13  4:27 AM      Result Value Range Status   Specimen Description SPUTUM   Final   Special Requests Normal   Final   Sputum evaluation     Final   Value: THIS SPECIMEN IS ACCEPTABLE. RESPIRATORY CULTURE REPORT TO FOLLOW.   Report Status 08/10/2013 FINAL   Final  CULTURE, RESPIRATORY (NON-EXPECTORATED)     Status: None   Collection Time    08/10/13  4:27 AM      Result Value Range Status   Specimen Description SPUTUM   Final   Special Requests NONE   Final   Gram Stain     Final   Value: RARE WBC PRESENT,BOTH PMN AND MONONUCLEAR     RARE SQUAMOUS EPITHELIAL CELLS PRESENT     NO ORGANISMS SEEN     Performed at [facility]   Culture     Final   Value: RARE CANDIDA ALBICANS     Performed at [facility]   Report Status 08/12/2013 FINAL   Final    Anti-infectives   Start     Dose/Rate Route Frequency Ordered Stop   08/12/13 1800  vancomycin (VANCOCIN) IVPB 1000 mg/200 mL premix     1,000 mg 200 mL/hr over 60  Minutes Intravenous Every 12 hours 08/12/13 0710     08/10/13 1800  vancomycin (VANCOCIN) IVPB 750 mg/150 ml premix  Status:  Discontinued     750 mg 150 mL/hr over 60 Minutes Intravenous Every 12 hours 08/10/13 1430 08/12/13 0710   08/10/13 1200  ciprofloxacin (CIPRO) IVPB 400 mg  Status:  Discontinued     400 mg 200 mL/hr over 60 Minutes Intravenous Every 12 hours 08/10/13 0129 08/10/13 1353   08/10/13 1100  vancomycin (VANCOCIN) 500 mg in sodium chloride 0.9 % 100 mL IVPB  Status:  Discontinued     500 mg 100 mL/hr  over 60 Minutes Intravenous Every 12 hours 08/10/13 0129 08/10/13 1430   08/10/13 0600  piperacillin-tazobactam (ZOSYN) IVPB 3.375 g     3.375 g 12.5 mL/hr over 240 Minutes Intravenous Every 8 hours 08/10/13 0129     08/09/13 2130  ciprofloxacin (CIPRO) IVPB 400 mg     400 mg 200 mL/hr over 60 Minutes Intravenous  Once 08/09/13 2048 08/10/13 0046   08/09/13 2100  vancomycin (VANCOCIN) IVPB 1000 mg/200 mL premix     1,000 mg 200 mL/hr over 60 Minutes Intravenous  Once 08/09/13 2048 08/09/13 2347   08/09/13 2100  piperacillin-tazobactam (ZOSYN) IVPB 3.375 g     3.375 g 100 mL/hr over 30 Minutes Intravenous  Once 08/09/13 2048 08/09/13 2240      Assessment: Patient with vancomycin level at goal.    Goal of Therapy:  Vancomycin trough level 15-20 mcg/ml  Plan:  Measure antibiotic drug levels at steady state Follow up culture results Continue vancomycin at current dose.  Darlina Guys, Jacquenette Shone Crowford 08/16/2013,6:50 AM

## 2013-08-17 LAB — CBC WITH DIFFERENTIAL/PLATELET
Basophils Absolute: 0 10*3/uL (ref 0.0–0.1)
Basophils Relative: 0 % (ref 0–1)
Eosinophils Absolute: 0.1 10*3/uL (ref 0.0–0.7)
Eosinophils Relative: 1 % (ref 0–5)
HCT: 35.5 % — ABNORMAL LOW (ref 39.0–52.0)
Hemoglobin: 11.6 g/dL — ABNORMAL LOW (ref 13.0–17.0)
Lymphocytes Relative: 30 % (ref 12–46)
Lymphs Abs: 3.8 10*3/uL (ref 0.7–4.0)
MCH: 27 pg (ref 26.0–34.0)
MCHC: 32.7 g/dL (ref 30.0–36.0)
MCV: 82.8 fL (ref 78.0–100.0)
Monocytes Absolute: 1.1 10*3/uL — ABNORMAL HIGH (ref 0.1–1.0)
Monocytes Relative: 9 % (ref 3–12)
Neutro Abs: 7.9 10*3/uL — ABNORMAL HIGH (ref 1.7–7.7)
Neutrophils Relative %: 61 % (ref 43–77)
Platelets: 493 10*3/uL — ABNORMAL HIGH (ref 150–400)
RBC: 4.29 MIL/uL (ref 4.22–5.81)
RDW: 14.6 % (ref 11.5–15.5)
WBC: 12.9 10*3/uL — ABNORMAL HIGH (ref 4.0–10.5)

## 2013-08-17 LAB — COMPREHENSIVE METABOLIC PANEL
ALT: 29 U/L (ref 0–53)
AST: 16 U/L (ref 0–37)
Albumin: 2.8 g/dL — ABNORMAL LOW (ref 3.5–5.2)
Alkaline Phosphatase: 60 U/L (ref 39–117)
BUN: 18 mg/dL (ref 6–23)
CO2: 28 mEq/L (ref 19–32)
Calcium: 9.2 mg/dL (ref 8.4–10.5)
Chloride: 97 mEq/L (ref 96–112)
Creatinine, Ser: 0.87 mg/dL (ref 0.50–1.35)
GFR calc Af Amer: >90 mL/min (ref >90)
GFR calc non Af Amer: 78 mL/min — ABNORMAL LOW (ref >90)
Glucose, Bld: 83 mg/dL (ref 70–99)
Potassium: 3.9 mEq/L (ref 3.5–5.1)
Sodium: 135 mEq/L (ref 135–145)
Total Bilirubin: 0.3 mg/dL (ref 0.3–1.2)
Total Protein: 6.4 g/dL (ref 6.0–8.3)

## 2013-08-17 MED ORDER — PREDNISONE 20 MG PO TABS: 10.0000 mg | ORAL_TABLET | Freq: Every day | ORAL | Status: DC

## 2013-08-17 MED ORDER — HEPARIN SOD (PORK) LOCK FLUSH 100 UNIT/ML IV SOLN
250.0000 [IU] | INTRAVENOUS | Status: AC | PRN
  Administered 2013-08-17: 250 [IU]

## 2013-08-17 MED ORDER — ZOLPIDEM TARTRATE 10 MG PO TABS: 5.0000 mg | ORAL_TABLET | Freq: Every day | ORAL | Status: DC

## 2013-08-17 MED ORDER — ENSURE COMPLETE PO LIQD: 237.0000 mL | Freq: Two times a day (BID) | ORAL | Status: DC

## 2013-08-17 MED ORDER — IPRATROPIUM BROMIDE 0.02 % IN SOLN: 0.5000 mg | Freq: Four times a day (QID) | RESPIRATORY_TRACT | Status: DC

## 2013-08-17 MED ORDER — AMOXICILLIN-POT CLAVULANATE 875-125 MG PO TABS: 1.0000 | ORAL_TABLET | Freq: Two times a day (BID) | ORAL | Status: AC

## 2013-08-17 MED ORDER — ALBUTEROL SULFATE (5 MG/ML) 0.5% IN NEBU: 2.5000 mg | INHALATION_SOLUTION | Freq: Four times a day (QID) | RESPIRATORY_TRACT | Status: DC

## 2013-08-17 MED ORDER — BUDESONIDE 0.25 MG/2ML IN SUSP: 0.2500 mg | Freq: Four times a day (QID) | RESPIRATORY_TRACT | Status: DC

## 2013-08-17 MED ORDER — PREDNISONE 10 MG PO TABS: 10.0000 mg | ORAL_TABLET | Freq: Every day | ORAL | Status: DC

## 2013-08-17 MED ORDER — VANCOMYCIN HCL 1000 MG IV SOLR: 1000.0000 mg | Freq: Two times a day (BID) | INTRAVENOUS | Status: AC

## 2013-08-17 NOTE — Progress Notes (Signed)
Pt's wife present and she and the patient agreeable to [facility]. Pt is doing well.

## 2013-08-17 NOTE — Progress Notes (Signed)
CSW met with patient and patient's spouse at bedside. CSW provided bed offers. They were very disappointed that [facility] did not have a bed available. They will review bed offers and let CSW know of decision.  John Black C. [name] MSW, LCSW [phone]

## 2013-08-17 NOTE — Progress Notes (Signed)
PULMONARY  / CRITICAL CARE MEDICINE  Name: John Black [name]: [id number] DOB: [birth year]    ADMISSION DATE:  08/09/2013 CONSULTATION DATE:  08/09/13  REFERRING MD :  Midge Minium PRIMARY SERVICE: [facility]  CHIEF COMPLAINT:  SOB, necrotizing pneumonia  BRIEF PATIENT DESCRIPTION: 79 y/o male with PMH relevant for severe COPD on home oxygen, prostate cancer and hypothyroidism. Recently hospitalized with a RUL necrotizing pneumonia. Sent home with PICC line with abx. Now back in the hospital with increased SOB and low grade fever. Critical care consulted for further recommendations.  SIGNIFICANT EVENTS / STUDIES:  11/21 - Chest X ray: RUL necrotizing pneumonia. 11/23 - Re-Admit, reported dyspnea at rest 11/24 - improved dyspnea, desats with activity to mid 80's 11/26 - improved aeration on CXR, improved saturations 12/01 - much improved, no distress.  Ok for d/c from pulm standpoint  LINES / TUBES: 10/25 PICC line>>>continue at d/c>>>  CULTURES: BCx2 11/23>>>neg UC 11/23>>>No growth Sputum 11/23>>>neg  ANTIBIOTICS: Zosyn 11/23>>> Ciprofloxacin 11/23>>>11/24 Vancomycin 11/23>>>  SUBJECTIVE: Baseline O2 requirements.  No c/o's, ready to go home  VITAL SIGNS: Temp:  [97.9 F (36.6 C)-98 F (36.7 C)] 98 F (36.7 C) (12/01 0540) Pulse Rate:  [72-85] 77 (12/01 0540) Resp:  [20-24] 20 (12/01 0540) BP: (98-107)/(59-83) 103/83 mmHg (12/01 0540) SpO2:  [93 %-97 %] 94 % (12/01 0846) Weight:  [122 lb 13.2 oz (55.712 kg)] 122 lb 13.2 oz (55.712 kg) (12/01 0540)  INTAKE / OUTPUT: Intake/Output     11/30 0701 - 12/01 0700 12/01 0701 - 12/02 0700   P.O. 720    I.V. (mL/kg) 465 (8.3)    IV Piggyback 450    Total Intake(mL/kg) 1635 (29.3)    Urine (mL/kg/hr) 1900 (1.4)    Stool 1 (0)    Total Output 1901     Net -266          Stool Occurrence       PHYSICAL EXAMINATION: General: alert, oriented, elderly male in NAD ENT: Oropharynx clear. Dry mucous  membranes. No thrush Lymph: No cervical, supraclavicular, or axillary lymphadenopathy. Heart: Normal S1, S2. No murmurs, rubs, or gallops appreciated. No bruits, equal pulses. PVC's per monitor Lungs: resp's even/non-labored, essentially clear  Abdomen: Abdomen soft, non-tender and not distended, normoactive bowel sounds.  Musculoskeletal: No clubbing or synovitis. 1 to 2 + LE edema. Skin: No rashes or lesions Neuro: No focal neurologic deficits.  LABS:  CBC  Recent Labs Lab 08/15/13 0456 08/16/13 0530 08/17/13 0410  WBC 12.8* 11.5* 12.9*  HGB 12.1* 11.2* 11.6*  HCT 36.6* 34.1* 35.5*  PLT 520* 447* 493*   BMET  Recent Labs Lab 08/15/13 0456 08/16/13 0530 08/17/13 0410  NA 135 135 135  K 4.1 3.6 3.9  CL 96 97 97  CO2 28 29 28   BUN 18 18 18   CREATININE 1.06 0.89 0.87  GLUCOSE 101* 90 83   Electrolytes  Recent Labs Lab 08/12/13 0335  08/15/13 0456 08/16/13 0530 08/17/13 0410  CALCIUM 9.0  < > 9.3 9.1 9.2  MG 2.2  --   --   --   --   PHOS 2.5  --   --   --   --   < > = values in this interval not displayed. Liver Enzymes  Recent Labs Lab 08/15/13 0456 08/16/13 0530 08/17/13 0410  AST 19 17 16   ALT 33 29 29  ALKPHOS 64 56 60  BILITOT 0.2* 0.2* 0.3  ALBUMIN 2.9* 2.5* 2.8*   Cardiac Enzymes  Recent Labs Lab 08/10/13 1110 08/10/13 1325 08/10/13 2235  TROPONINI <0.30 0.33* <0.30   ASSESSMENT / PLAN:  PULMONARY A: RUL necrotizing pneumonia - Chest X ray with increased density of RUL consolidation on admit. Improvement of RLL infiltrates previously seen on X ray from 11/7. No elevated WBC, no fever since admission. Only 100.4 at home. Not certain this is antibiotic failure. CXR has improved aeration on 11/26 with improved clinical status.    P:   - Continue Vanco IV Q12 for 21 days total --D9/21 - oral Augmentin for two weeks -- D2/14 - Continue Pulmicort and duonebs. - Transition to oral pred with quick taper to off - reduced to 20 mg QD  11/28 - Continue Chest PT, incentive spirometry. - Supplemental oxygen to keep O2 sat >92%.  Pt has monitor for home.  Understands saturation goal of 88-95% and flexing O2 up / down if needed - Increase activity as tolerated. - For D/C:  Home health PT, RN, Rolling Walker, Shower Chair - continue probiotic (align) & yogurt, with such abx exposure (discussed with pt) - follow up with Dr. Shelle Iron arranged.  Will need BMP to review renal fxn (vanco) at time of visit  Code status: - DNR / DNI  Ok to discharge home from Pulmonary standpoint.  Follow up arranged.  D/C pulmonary meds written.    Canary Brim, NP-C  Pulmonary & Critical Care Pgr: [phone] or [phone]  08/17/2013, 10:49 AM  PCCM ATTENDING: Evaluated with ACNP Veleta Miners. Agree with above  Billy Fischer, MD;  PCCM service; Mobile [phone]

## 2013-08-18 ENCOUNTER — Telehealth: Payer: Self-pay | Admitting: Pulmonary Disease

## 2013-08-18 DIAGNOSIS — J439 Emphysema, unspecified: Secondary | ICD-10-CM

## 2013-08-18 NOTE — Progress Notes (Signed)
Patient and spouse decided to take patient home with home health.  Romone Shaff C. Leonila Speranza MSW, LCSW [phone]

## 2013-08-18 NOTE — Discharge Summary (Signed)
Physician Discharge Summary  Rea Reser ZOX:[id number] DOB: [birth year] DOA: 08/09/2013  PCP: Milinda Antis, MD  Admit date: 08/09/2013 Discharge date: 08/18/2013  Time spent: 35 minutes  Recommendations for Outpatient Follow-up:  1. Patient will continue on IV vancomycin every 12 hours for the next 12 days 2. He was down oral Augmentin for the next 2 weeks 3. To be discharged on a quick prednisone taper he be discharged to home health PT, RN, rolling walker and shower chair 4. He will followup with Dr. Shelle Iron, pulmonary medicine 5. He'll continue chest physical therapy  Discharge Diagnoses:  Active Problems:   PROSTATE CANCER   COPD (chronic obstructive pulmonary disease) with emphysema   Hypothyroidism   Acute-on-chronic respiratory failure   Hyponatremia   HCAP (healthcare-associated pneumonia)/ Probable necrotizing PNA   Discharge Condition: Improved, being discharged home  Diet recommendation: Heart healthy  Filed Weights   08/15/13 0517 08/16/13 0509 08/17/13 0540  Weight: 55.2 kg (121 lb 11.1 oz) 55.929 kg (123 lb 4.8 oz) 55.712 kg (122 lb 13.2 oz)    History of present illness:  Patient is an 79 year old male diagnosed with necrotizing pneumonia recently and was found to have worsening of pneumonia cavitation is and was started on Zosyn and Cipro as outpatient after PICC line placed by patient's pulmonologist. Despite this, patient's breathing got worse and he came into the emergency room on 11/23. His baseline is 4 L of oxygen and is increased to 6 L. He is also noted by family to have reported fevers. Patient was admitted to the hospitalist service.  Hospital Course:  Patient initially was placed in the step down unit for close monitoring. Infectious disease consulted and recommended continuing vancomycin and Zosyn for a total of 21 days. He was also put on IV steroids as per pulmonary medicine who are following the patient as well. Over the next few days, his  breathing became easier and he was transferred out on 11/26 to the floor. He was continued in physical therapy to increase his mobility and keep his oxygen levels greater than 92% on 4 L.  During patient's evaluation, he was noted to have one slightly high troponin breasts were normal, there is a question whether this is related to his respiratory versus a mild demand ischemia, however this improved. Subsequent troponins were normal.  Hypertension: Patient initially had Coreg added, however once his breathing became more stable and improved and ammonia was further treated, blood pressure started to come down to slightly low numbers and Coreg was discontinued.  Procedures:  None  Consultations:  Pulmonary  Infectious disease  Discharge Exam: Filed Vitals:   08/17/13 0540  BP: 103/83  Pulse: 77  Temp: 98 F (36.7 C)  Resp: 20    General: Alert and oriented x2, no acute distress Cardiovascular: Regular rate and rhythm, S1-S2 Respiratory: Decreased breath sounds throughout  Discharge Instructions  Discharge Orders   Future Appointments Provider Department Dept Phone   08/25/2013 4:00 PM Barbaraann Share, MD [facility] [phone]   08/28/2013 11:30 AM Salley Scarlet, MD [facility] [phone]   09/01/2013 2:00 PM Judyann Munson, MD [facility] [phone]   09/21/2013 10:30 AM Barbaraann Share, MD [facility] [phone]   Future Orders Complete By Expires   Diet - low sodium heart healthy  As directed    Increase activity slowly  As directed        Medication List    STOP taking  these medications       ciprofloxacin 400 MG/200ML Soln  Commonly known as:  CIPRO IN D5W     piperacillin-tazobactam 3.375 GM/50ML IVPB  Commonly known as:  ZOSYN      TAKE these medications       acetaminophen 500 MG tablet  Commonly known as:  TYLENOL  Take by mouth every 6 (six) hours as needed for mild  pain or fever.     albuterol (5 MG/ML) 0.5% nebulizer solution  Commonly known as:  PROVENTIL  Take 0.5 mLs (2.5 mg total) by nebulization every 6 (six) hours.     amoxicillin-clavulanate 875-125 MG per tablet  Commonly known as:  AUGMENTIN  Take 1 tablet by mouth every 12 (twelve) hours.     budesonide 0.25 MG/2ML nebulizer solution  Commonly known as:  PULMICORT  Take 2 mLs (0.25 mg total) by nebulization every 6 (six) hours.     CITRACAL CALCIUM+D PO  Take 1 tablet by mouth every morning.     doxazosin 8 MG tablet  Commonly known as:  CARDURA  Take 8 mg by mouth at bedtime.     feeding supplement (ENSURE COMPLETE) Liqd  Take 237 mLs by mouth 2 (two) times daily between meals.     ibuprofen 400 MG tablet  Commonly known as:  ADVIL,MOTRIN  Take 400 mg by mouth every 6 (six) hours as needed for fever, headache or mild pain.     ipratropium 0.02 % nebulizer solution  Commonly known as:  ATROVENT  Take 2.5 mLs (0.5 mg total) by nebulization every 6 (six) hours.     Ipratropium-Albuterol 20-100 MCG/ACT Aers respimat  Commonly known as:  COMBIVENT  Inhale 1 puff into the lungs every 6 (six) hours as needed for wheezing or shortness of breath.     levothyroxine 75 MCG tablet  Commonly known as:  SYNTHROID, LEVOTHROID  Take 1 tablet (75 mcg total) by mouth daily.     multivitamin with minerals Tabs tablet  Take 1 tablet by mouth every morning.     oxyCODONE-acetaminophen 5-325 MG per tablet  Commonly known as:  PERCOCET/ROXICET  Take 1 tablet by mouth at bedtime.     predniSONE 10 MG tablet  Commonly known as:  DELTASONE  Take 1 tablet (10 mg total) by mouth daily with breakfast.     sodium chloride 0.9 % SOLN 250 mL with vancomycin 1000 MG SOLR 1,000 mg  Inject 1,000 mg into the vein every 12 (twelve) hours. Next dose due evening of 12/1     zolpidem 10 MG tablet  Commonly known as:  AMBIEN  Take 0.5 tablets (5 mg total) by mouth at bedtime.       Allergies   Allergen Reactions  . Morphine     REACTION: sweats       Follow-up Information   Follow up with Barbaraann Share, MD On 08/25/2013. (Appt at 4:00 PM.  Arrive at 3:30 for lab draw.)    Specialty:  Pulmonary Disease   Contact information:   [address] [place] [address] [phone]       Follow up with Judyann Munson, MD On 08/17/2013. (Call for an appointment to be seen in 2 weeks. They are aware you will be calling for an appt.  )    Specialty:  Infectious Diseases   Contact information:   [address] [place] [address] [phone]       Follow up with Milinda Antis, MD  In 1 month.   Specialty:  Family Medicine   Contact information:   [address] [place] [address] [phone]        The results of significant diagnostics from this hospitalization (including imaging, microbiology, ancillary and laboratory) are listed below for reference.    Significant Diagnostic Studies: Dg Chest 2 View  08/07/2013   CLINICAL DATA:  History of pneumonia  EXAM: CHEST  2 VIEW  COMPARISON:  07/24/2013  FINDINGS: The cardiac silhouette is within normal limits. The patient is right-sided central venous catheter has been removed in the interim. Right-sided volume loss is once again appreciated. There is increased conspicuity of the partially consolidated density in the right upper lobe. Bullous changes are again appreciated with areas of air-fluid levels. No new focal regions of consolidation identified. The left hemi thorax is unremarkable. Visualized bony skeleton is unremarkable.  IMPRESSION: Worsening right upper lobe pneumonitis as described above. Emphysematous changes are also appreciated with air-fluid levels.   Electronically Signed   By: Salome Holmes M.D.   On: 08/07/2013 12:04   Dg Chest 2 View  07/24/2013   CLINICAL DATA:  Followup pneumonia. Short of breath.  EXAM: CHEST  2 VIEW  COMPARISON:  07/11/2013. 07/06/2013.  10/02/2012.  FINDINGS:  Heart size remains normal. Right arm PICC has its tip in the SVC just above the right atrium. Emphysematous change again noted affecting the left lung without focal infiltrate. On the right, there is worsening of consolidation in the upper lobe with some volume loss. Mild patchy density persists in the lower lobe. No pleural fluid accumulation. Some air-fluid levels in the upper lobe probably relate to inflammation within bullous disease.  IMPRESSION: Worsening of pneumonia in the emphysematous right upper lobe.   Electronically Signed   By: Paulina Fusi M.D.   On: 07/24/2013 16:17   Ct Chest Wo Contrast  08/07/2013   CLINICAL DATA:  followup necrotizing pneumonia, shortness of breath  EXAM: CT CHEST WITHOUT CONTRAST  TECHNIQUE: Multidetector CT imaging of the chest was performed following the standard protocol without IV contrast.  COMPARISON:  Chest radiograph 08/07/2013 and 08/03/2013 as well as 07/11/2013, chest CT 06/2013  FINDINGS: There is dense consolidation throughout the right upper lobe. The process again spares the middle and lower lobes, but shows much greater severity of consolidation and is much more extensive than it was previously. There is volume loss now in the right upper lobe with elevation of the fissures. There are dense air bronchograms throughout much of the right upper lobe, and there are at least 2 cavities demonstrating air-fluid levels. The 1st of these is on series 2, image number 16, and measures about 2 cm. The 2nd is on image number 20 and measures about 3 cm. There is no pleural effusion. There is no evidence of chest wall destruction.  Bilateral calcified pleural plaques are again identified. Diffuse emphysematous change is stable. There is again noted to be adenopathy with the largest lymph node located in the precarinal area, measuring in short axis 2.5 cm. It is unchanged as are other smaller lymph nodes.  No pericardial or cardiac abnormality of acute nature. There is  coronary arterial calcification. There is calcification of the aorta again identified. There are no acute osseous abnormalities. Scans through the upper abdomen show no acute findings.  IMPRESSION: Increase in extent and severity of necrotic right upper lobe pneumonia. There are now cavitary areas within the consolidative process concerning for pulmonary abscess.  Electronically Signed   By: Esperanza Heir M.D.   On: 08/07/2013 15:12   Ir Fluoro Guide Cv Line Right  08/08/2013   CLINICAL DATA:  COPD, pneumonia and need for long-term IV antibiotic therapy.  EXAM: POWER PICC LINE PLACEMENT WITH ULTRASOUND AND FLUOROSCOPIC GUIDANCE  FLUOROSCOPY TIME:  24 seconds.  PROCEDURE: The patient was advised of the possible risks and complications and agreed to undergo the procedure. The patient was then brought to the angiographic suite for the procedure.  The right arm was prepped with chlorhexidine, draped in the usual sterile fashion using maximum barrier technique (cap and mask, sterile gown, sterile gloves, large sterile sheet, hand hygiene and cutaneous antisepsis) and infiltrated locally with 1% Lidocaine.  Ultrasound demonstrated patency of the right brachial vein, and this was documented with an image. Under real-time ultrasound guidance, this vein was accessed with a 21 gauge micropuncture needle and image documentation was performed. A 0.018 wire was introduced in to the vein. Over this, a 37 cm, 5 [place] single lumen power injectable PICC was advanced to the lower SVC/right atrial junction. Fluoroscopy during the procedure and fluoro spot radiograph confirms appropriate catheter position. The catheter was flushed and covered with a sterile dressing.  Complications: None  IMPRESSION: Successful right arm power injectable PICC line placement with ultrasound and fluoroscopic guidance. The catheter is ready for use.   Electronically Signed   By: Irish Lack M.D.   On: 08/08/2013 12:43   Ir US Guide Vasc  Access Right  08/08/2013    IMPRESSION: Successful right arm power injectable PICC line placement with ultrasound and fluoroscopic guidance. The catheter is ready for use.   Electronically Signed   By: Irish Lack M.D.   On: 08/08/2013 12:43   Dg Chest Port 1 View  08/15/2013     IMPRESSION: No change in the patchy parenchymal disease in the right upper lung.  Emphysema.   Electronically Signed   By: Richarda Overlie M.D.   On: 08/15/2013 08:11   Dg Chest Port 1 View  08/12/2013     IMPRESSION: 1. Persistent atelectasis and infiltrate right upper lobe. 2. PICC line in good anatomic position.   Electronically Signed   By: Maisie Fus  Register   On: 08/12/2013 07:05   Dg Chest Port 1 View  08/11/2013     IMPRESSION: No significant change in the right upper lung pneumonia.   Electronically Signed   By: Richarda Overlie M.D.   On: 08/11/2013 07:31   Dg Chest Port 1 View  08/09/2013     IMPRESSION: No significant change from prior study. Right upper lobe necrotic pneumonia again identified.   Electronically Signed   By: Esperanza Heir M.D.   On: 08/09/2013 21:35    Microbiology: Recent Results (from the past 240 hour(s))  URINE CULTURE     Status: None   Collection Time    08/09/13  9:14 PM      Result Value Range Status   Specimen Description URINE, CLEAN CATCH   Final   Special Requests NONE   Final   Culture  Setup Time     Final   Value: 08/10/2013 03:35     Performed at [facility] Count     Final   Value: NO GROWTH     Performed at [facility]   Culture     Final   Value: NO GROWTH     Performed at [facility]  Report Status 08/10/2013 FINAL   Final  CULTURE, BLOOD (ROUTINE X 2)     Status: None   Collection Time    08/09/13  9:25 PM      Result Value Range Status   Specimen Description BLOOD LEFT ANTECUBITAL   Final   Special Requests BOTTLES DRAWN AEROBIC AND ANAEROBIC 5CC   Final   Culture  Setup Time     Final   Value: 08/10/2013 02:53      Performed at [facility]   Culture     Final   Value: NO GROWTH 5 DAYS     Performed at [facility]   Report Status 08/16/2013 FINAL   Final  CULTURE, BLOOD (ROUTINE X 2)     Status: None   Collection Time    08/09/13  9:25 PM      Result Value Range Status   Specimen Description BLOOD RIGHT ANTECUBITAL   Final   Special Requests BOTTLES DRAWN AEROBIC AND ANAEROBIC 5CC   Final   Culture  Setup Time     Final   Value: 08/10/2013 02:53     Performed at [facility]   Culture     Final   Value: NO GROWTH 5 DAYS     Performed at [facility]   Report Status 08/16/2013 FINAL   Final  MRSA PCR SCREENING     Status: None   Collection Time    08/10/13  1:58 AM      Result Value Range Status   MRSA by PCR NEGATIVE  NEGATIVE Final   Comment:            The GeneXpert MRSA Assay (FDA     approved for NASAL specimens     only), is one component of a     comprehensive MRSA colonization     surveillance program. It is not     intended to diagnose MRSA     infection nor to guide or     monitor treatment for     MRSA infections.  CULTURE, EXPECTORATED SPUTUM-ASSESSMENT     Status: None   Collection Time    08/10/13  4:27 AM      Result Value Range Status   Specimen Description SPUTUM   Final   Special Requests Normal   Final   Sputum evaluation     Final   Value: THIS SPECIMEN IS ACCEPTABLE. RESPIRATORY CULTURE REPORT TO FOLLOW.   Report Status 08/10/2013 FINAL   Final  CULTURE, RESPIRATORY (NON-EXPECTORATED)     Status: None   Collection Time    08/10/13  4:27 AM      Result Value Range Status   Specimen Description SPUTUM   Final   Special Requests NONE   Final   Gram Stain     Final   Value: RARE WBC PRESENT,BOTH PMN AND MONONUCLEAR     RARE SQUAMOUS EPITHELIAL CELLS PRESENT     NO ORGANISMS SEEN     Performed at [facility]   Culture     Final   Value: RARE CANDIDA ALBICANS     Performed at [facility]   Report Status  08/12/2013 FINAL   Final     Labs: Basic Metabolic Panel:  Recent Labs Lab 08/12/13 0335 08/13/13 0341 08/15/13 0456 08/16/13 0530 08/17/13 0410  NA 135 134* 135 135 135  K 4.2 4.0 4.1 3.6 3.9  CL 97 94* 96 97 97  CO2 29  30 28 29 28   GLUCOSE 116* 96 101* 90 83  BUN 19 23 18 18 18   CREATININE 0.84 1.00 1.06 0.89 0.87  CALCIUM 9.0 9.2 9.3 9.1 9.2  MG 2.2  --   --   --   --   PHOS 2.5  --   --   --   --    Liver Function Tests:  Recent Labs Lab 08/15/13 0456 08/16/13 0530 08/17/13 0410  AST 19 17 16   ALT 33 29 29  ALKPHOS 64 56 60  BILITOT 0.2* 0.2* 0.3  PROT 6.9 6.0 6.4  ALBUMIN 2.9* 2.5* 2.8*   No results found for this basename: LIPASE, AMYLASE,  in the last 168 hours No results found for this basename: AMMONIA,  in the last 168 hours CBC:  Recent Labs Lab 08/13/13 0915 08/14/13 0620 08/15/13 0456 08/16/13 0530 08/17/13 0410  WBC 11.5* 11.2* 12.8* 11.5* 12.9*  NEUTROABS 8.7* 6.8 7.5 7.3 7.9*  HGB 11.0* 11.2* 12.1* 11.2* 11.6*  HCT 34.0* 34.4* 36.6* 34.1* 35.5*  MCV 84.2 83.7 82.8 82.8 82.8  PLT 461* 478* 520* 447* 493*   Cardiac Enzymes: No results found for this basename: CKTOTAL, CKMB, CKMBINDEX, TROPONINI,  in the last 168 hours BNP: BNP (last 3 results)  Recent Labs  07/24/13 2004 08/10/13 0245  PROBNP 335.2 888.5*   CBG: No results found for this basename: GLUCAP,  in the last 168 hours     Signed:  Hollice Espy  [facility] 08/18/2013, 5:48 PM

## 2013-08-18 NOTE — Telephone Encounter (Signed)
I called and spoke with John Black. She reports on pulmonary rehab referral he had COPD stage 3 marked. For this DX he will need a PFT done. I do not see this in epic. Although if we stated he has emphysema he does not need a PFT. A new order will need to be placed. Please advise KC thanks

## 2013-08-18 NOTE — Telephone Encounter (Signed)
Duplicate message/phone note.

## 2013-08-18 NOTE — Telephone Encounter (Signed)
He needs to have a diagnosis of "emphysema".  Please send to [facility].  Thanks.

## 2013-08-19 NOTE — Telephone Encounter (Signed)
Order has been placed.

## 2013-08-20 ENCOUNTER — Encounter: Payer: Self-pay | Admitting: Family Medicine

## 2013-08-20 LAB — AFB CULTURE WITH SMEAR (NOT AT ARMC): Acid Fast Smear: NONE SEEN

## 2013-08-21 ENCOUNTER — Encounter (HOSPITAL_COMMUNITY): Payer: Self-pay

## 2013-08-21 LAB — AFB CULTURE WITH SMEAR (NOT AT ARMC): Acid Fast Smear: NONE SEEN

## 2013-08-21 NOTE — Progress Notes (Signed)
I had received an order for John Black to attend Pulmonary Rehab at [facility] back in August from Dr. Shelle Iron.  I had been in contact with the patient and the patient stated that he would rather go to the [facility] because it was closer to his residence.  I transferred all of the information I had on the patient over to the [facility] and they stated they would be in contact with the patient.  I received another order from Dr. Shelle Iron on 08/19/13.  I contacted the patient to inquire about him going to [facility] and he stated that the program had indeed been in contact with him and they were working on getting him into the program.

## 2013-08-25 ENCOUNTER — Ambulatory Visit (INDEPENDENT_AMBULATORY_CARE_PROVIDER_SITE_OTHER): Payer: Medicare Other | Admitting: Pulmonary Disease

## 2013-08-25 ENCOUNTER — Encounter: Payer: Self-pay | Admitting: Pulmonary Disease

## 2013-08-25 VITALS — BP 108/62 | HR 122 | Temp 98.5°F | Ht 66.0 in | Wt 126.0 lb

## 2013-08-25 DIAGNOSIS — J189 Pneumonia, unspecified organism: Secondary | ICD-10-CM

## 2013-08-25 DIAGNOSIS — J438 Other emphysema: Secondary | ICD-10-CM

## 2013-08-25 DIAGNOSIS — J439 Emphysema, unspecified: Secondary | ICD-10-CM

## 2013-08-25 NOTE — Assessment & Plan Note (Signed)
The patient has a severe necrotizing pneumonia involving the right upper lobe. He has severe emphysema in this area, and probably gets very little blood flow. This results in poor antibiotic penetration and refractory symptoms. He has been on multiple rounds of IV antibiotics with some response, but he appears to be much improved after the latest round of therapy. I would like for him to finish up his course, and then would give him approximately 5-7 days off antibiotics before removing his PICC line.

## 2013-08-25 NOTE — Progress Notes (Signed)
   Subjective:    Patient ID: John Black, male    DOB: 11/09/1928, 79 y.o.   [name]: [id number]  HPI The patient comes in today for his post hospital followup. He has known severe COPD with chronic respiratory failure, as well as a necrotizing pneumonia involving his right upper lobe with significant destruction. He has been on multiple rounds of IV antibiotics with improvement, but been worsening after discontinuation. He was recently in the hospital for 8 days where he received IV vancomycin and Zosyn, and on discharge he was continued on IV Vanco and by mouth Augmentin. The plan was for him to do an additional 12 days of vancomycin and 2 weeks of Augmentin. He comes in today where he is definitely feeling better. His breathing has been improved, and he is getting stronger with increased appetite. He still gets very dyspneic with any activity. He is not coughing up purulent-appearing mucus, nor is he having any fever.   Review of Systems  Constitutional: Negative for fever and unexpected weight change.  HENT: Negative for congestion, dental problem, ear pain, nosebleeds, postnasal drip, rhinorrhea, sinus pressure, sneezing, sore throat and trouble swallowing.   Eyes: Negative for redness and itching.  Respiratory: Negative for cough, chest tightness, shortness of breath and wheezing.   Cardiovascular: Negative for palpitations and leg swelling.  Gastrointestinal: Negative for nausea and vomiting.  Genitourinary: Negative for dysuria.  Musculoskeletal: Negative for joint swelling.  Skin: Negative for rash.  Neurological: Negative for headaches.  Hematological: Does not bruise/bleed easily.  Psychiatric/Behavioral: Negative for dysphoric mood. The patient is not nervous/anxious.        Objective:   Physical Exam Thin male in no acute distress Nose without purulence or discharge noted Neck without lymphadenopathy or thyromegaly Chest with decreased breath sounds, no active wheezing or  rhonchi Cardiac exam with regular rate and rhythm Lower extremities with no significant edema, no cyanosis Alert and oriented, moves all 4 extremities.       Assessment & Plan:

## 2013-08-25 NOTE — Assessment & Plan Note (Signed)
The patient appears to be stable from a pulmonary standpoint at this time. He wishes to come off nebulized bronchodilators, and no back on maintenance inhalers. I will restart Symbicort, but he will still have albuterol as needed by nebulization her rescue.

## 2013-08-25 NOTE — Patient Instructions (Signed)
Finish up your vancomycin IV and augmentin as we discussed Will leave your Picc line in about a week after coming off IV antibiotics to make sure that your fever will not return. Stop budesonide and atrovent (ipratropium) nebs Use albuterol nebs only for rescue, along with albuterol inhaler if away from home Stop combivent Restart symbicort, 2puffs am and pm.  Rinse mouth well.  followup with me in 2 weeks.

## 2013-08-27 ENCOUNTER — Telehealth: Payer: Self-pay | Admitting: *Deleted

## 2013-08-27 NOTE — Telephone Encounter (Signed)
Message copied by Samuella Cota on Thu Aug 27, 2013  9:05 AM ------      Message from: Milinda Antis F      Created: Fri Aug 21, 2013  1:41 PM      Regarding: FW: Call pt on Friday             Please call and check on patient, see how is doing, does he have the [facility] set up since discharge from hospital, how is breathing                  ----- Message -----         From: Salley Scarlet, MD         Sent: 08/19/2013   8:30 AM           To: Salley Scarlet, MD      Subject: Call pt on Friday                                               ------

## 2013-08-27 NOTE — Telephone Encounter (Signed)
Pt states that he is feeling better, HH came in since discharge from hospital, breathing is better he is on IV's.

## 2013-08-28 ENCOUNTER — Ambulatory Visit: Payer: Medicare Other | Admitting: Family Medicine

## 2013-09-01 ENCOUNTER — Encounter: Payer: Self-pay | Admitting: Internal Medicine

## 2013-09-01 ENCOUNTER — Ambulatory Visit (INDEPENDENT_AMBULATORY_CARE_PROVIDER_SITE_OTHER): Payer: Medicare Other | Admitting: Internal Medicine

## 2013-09-01 VITALS — BP 127/71 | HR 114 | Temp 97.2°F | Wt 127.0 lb

## 2013-09-01 DIAGNOSIS — J438 Other emphysema: Secondary | ICD-10-CM

## 2013-09-01 DIAGNOSIS — J439 Emphysema, unspecified: Secondary | ICD-10-CM

## 2013-09-01 DIAGNOSIS — J189 Pneumonia, unspecified organism: Secondary | ICD-10-CM

## 2013-09-01 NOTE — Progress Notes (Signed)
Subjective:    Patient ID: John Black, male    DOB: 14-Jul-1929, 79 y.o.   [name]: [id number]  HPI 79yo M with COPD, emphysema who had necrotizing pneumonia finishing 21 day of vanco and augmentin as of today. He was previously on other oral course of antibiotics prior to his recent hospitalization, thought to have insufficient penetration. Unfortunately, never was able to identify pathogen for his necrotizing pneumonia. He is doing well. He denies any productive cough. He still has oxygen needs full time whereas before this event, it was as needed. He is also following up with dr. Shelle Iron from pulmonology  Current Outpatient Prescriptions on File Prior to Visit  Medication Sig Dispense Refill  . acetaminophen (TYLENOL) 500 MG tablet Take by mouth every 6 (six) hours as needed for mild pain or fever.       Marland Kitchen albuterol (PROVENTIL) (5 MG/ML) 0.5% nebulizer solution Take 0.5 mLs (2.5 mg total) by nebulization every 6 (six) hours.  20 mL  12  . budesonide (PULMICORT) 0.25 MG/2ML nebulizer solution Take 2 mLs (0.25 mg total) by nebulization every 6 (six) hours.  60 mL  12  . Calcium-Magnesium-Vitamin D (CITRACAL CALCIUM+D PO) Take 1 tablet by mouth every morning.       Marland Kitchen doxazosin (CARDURA) 8 MG tablet Take 8 mg by mouth at bedtime.      . feeding supplement, ENSURE COMPLETE, (ENSURE COMPLETE) LIQD Take 237 mLs by mouth 2 (two) times daily between meals.      Marland Kitchen ibuprofen (ADVIL,MOTRIN) 400 MG tablet Take 400 mg by mouth every 6 (six) hours as needed for fever, headache or mild pain.       Marland Kitchen ipratropium (ATROVENT) 0.02 % nebulizer solution Take 2.5 mLs (0.5 mg total) by nebulization every 6 (six) hours.  75 mL  12  . Ipratropium-Albuterol (COMBIVENT) 20-100 MCG/ACT AERS respimat Inhale 1 puff into the lungs every 6 (six) hours as needed for wheezing or shortness of breath.      . levothyroxine (SYNTHROID, LEVOTHROID) 75 MCG tablet Take 1 tablet (75 mcg total) by mouth daily.  90 tablet  1  . Multiple  Vitamin (MULTIVITAMIN WITH MINERALS) TABS tablet Take 1 tablet by mouth every morning.      Marland Kitchen oxyCODONE-acetaminophen (PERCOCET/ROXICET) 5-325 MG per tablet Take 1 tablet by mouth at bedtime.      . predniSONE (DELTASONE) 10 MG tablet Take 1 tablet (10 mg total) by mouth daily with breakfast.  4 tablet  0  . zolpidem (AMBIEN) 10 MG tablet Take 0.5 tablets (5 mg total) by mouth at bedtime.  30 tablet  0   No current facility-administered medications on file prior to visit.   Active Ambulatory Problems    Diagnosis Date Noted  . PROSTATE CANCER 11/07/2010  . ALLERGIC RHINITIS 11/07/2010  . COPD (chronic obstructive pulmonary disease) with emphysema 11/07/2010  . Dizzy spells 06/19/2012  . Hypothyroidism 06/19/2012  . Atypical nevi 06/19/2012  . Seborrheic keratosis 06/19/2012  . BPH (benign prostatic hyperplasia) 10/22/2012  . Insomnia 10/22/2012  . Acute exacerbation of emphysema 11/10/2012  . Sinusitis, chronic 11/28/2012  . Mild hyperlipidemia 06/28/2013  . Acute-on-chronic respiratory failure 07/02/2013  . Leukocytosis, unspecified 07/02/2013  . Hyponatremia 07/02/2013  . Anemia 07/03/2013  . necrotizing pneumonia 07/06/2013  . Chest pain 07/06/2013  . Abnormal CT scan, lung 07/07/2013  . Loss of weight 07/18/2013  . Pedal edema 07/18/2013   Resolved Ambulatory Problems    Diagnosis Date Noted  . COPD exacerbation  07/07/2012  . CAP (community acquired pneumonia) 09/05/2012  . Sinusitis 10/03/2012  . Sinusitis, acute 11/10/2012  . Sinus tachycardia 07/02/2013   Past Medical History  Diagnosis Date  . Emphysema   . Allergic rhinitis   . Prostate cancer   . Hypothyroid   . Cataract   . PNA (pneumonia)   . Chronic respiratory failure       Review of Systems Review of Systems  Constitutional: Negative for fever, chills, diaphoresis, activity change, appetite change, fatigue and unexpected weight change.  HENT: Negative for congestion, sore throat, rhinorrhea,  sneezing, trouble swallowing and sinus pressure.  Eyes: Negative for photophobia and visual disturbance.  Respiratory: per hpi Cardiovascular: Negative for chest pain, palpitations and leg swelling.  Gastrointestinal: Negative for nausea, vomiting, abdominal pain, diarrhea, constipation, blood in stool, abdominal distention and anal bleeding.  Genitourinary: Negative for dysuria, hematuria, flank pain and difficulty urinating.  Musculoskeletal: Negative for myalgias, back pain, joint swelling, arthralgias and gait problem.  Skin: Negative for color change, pallor, rash and wound.  Neurological: Negative for dizziness, tremors, weakness and light-headedness.  Hematological: Negative for adenopathy. Does not bruise/bleed easily.  Psychiatric/Behavioral: Negative for behavioral problems, confusion, sleep disturbance, dysphoric mood, decreased concentration and agitation.        Objective:   Physical Exam BP 127/71  Pulse 114  Temp(Src) 97.2 F (36.2 C) (Oral)  Wt 127 lb (57.607 kg) Physical Exam  Constitutional: He is oriented to person, place, and time. He appears well-developed and well-nourished. No distress.  HENT:  Mouth/Throat: Oropharynx is clear and moist. No oropharyngeal exudate.  Cardiovascular: Normal rate, regular rhythm and normal heart sounds. Exam reveals no gallop and no friction rub.  No murmur heard.  Pulmonary/Chest: Effort normal and breath sounds normal. No respiratory distress. He has no wheezes.  Abdominal: Soft. Bowel sounds are normal. He exhibits no distension. There is no tenderness.  Lymphadenopathy:  He has no cervical adenopathy.  Neurological: He is alert and oriented to person, place, and time.  Skin: Skin is warm and dry. No rash noted. No erythema.  Psychiatric: He has a normal mood and affect. His behavior is normal.        Assessment & Plan:   = appears recovered from necrotizing pneumonia. At this time , no need for further antibiotics. He is  following up with Dr. Shelle Iron early next week to decide to discontinue picc line. From infectious disease standpoint, would agree to  Remove picc line and no longer need IV therapy. Can re-evaluate for oral antibiotics if it is felt that his symptoms are worsening.

## 2013-09-08 ENCOUNTER — Ambulatory Visit (INDEPENDENT_AMBULATORY_CARE_PROVIDER_SITE_OTHER): Payer: Medicare Other | Admitting: Adult Health

## 2013-09-08 ENCOUNTER — Ambulatory Visit (INDEPENDENT_AMBULATORY_CARE_PROVIDER_SITE_OTHER)
Admission: RE | Admit: 2013-09-08 | Discharge: 2013-09-08 | Disposition: A | Discharge status: Home / Self Care | Payer: Medicare Other | Source: Ambulatory Visit | Attending: Adult Health | Admitting: Adult Health

## 2013-09-08 ENCOUNTER — Encounter: Payer: Self-pay | Admitting: Adult Health

## 2013-09-08 VITALS — BP 118/64 | HR 97 | Temp 98.8°F | Ht 66.0 in | Wt 128.4 lb

## 2013-09-08 DIAGNOSIS — J189 Pneumonia, unspecified organism: Secondary | ICD-10-CM

## 2013-09-08 IMAGING — CR DG CHEST 2V
2 series · 2 of 2 positions shown · non-contrast
Comparison: Portable chest x-ray [DATE] and [DATE].

CLINICAL DATA: History of pneumonia, emphysema

EXAM:
CHEST  2 VIEW

[view not recorded (1 of 2)]
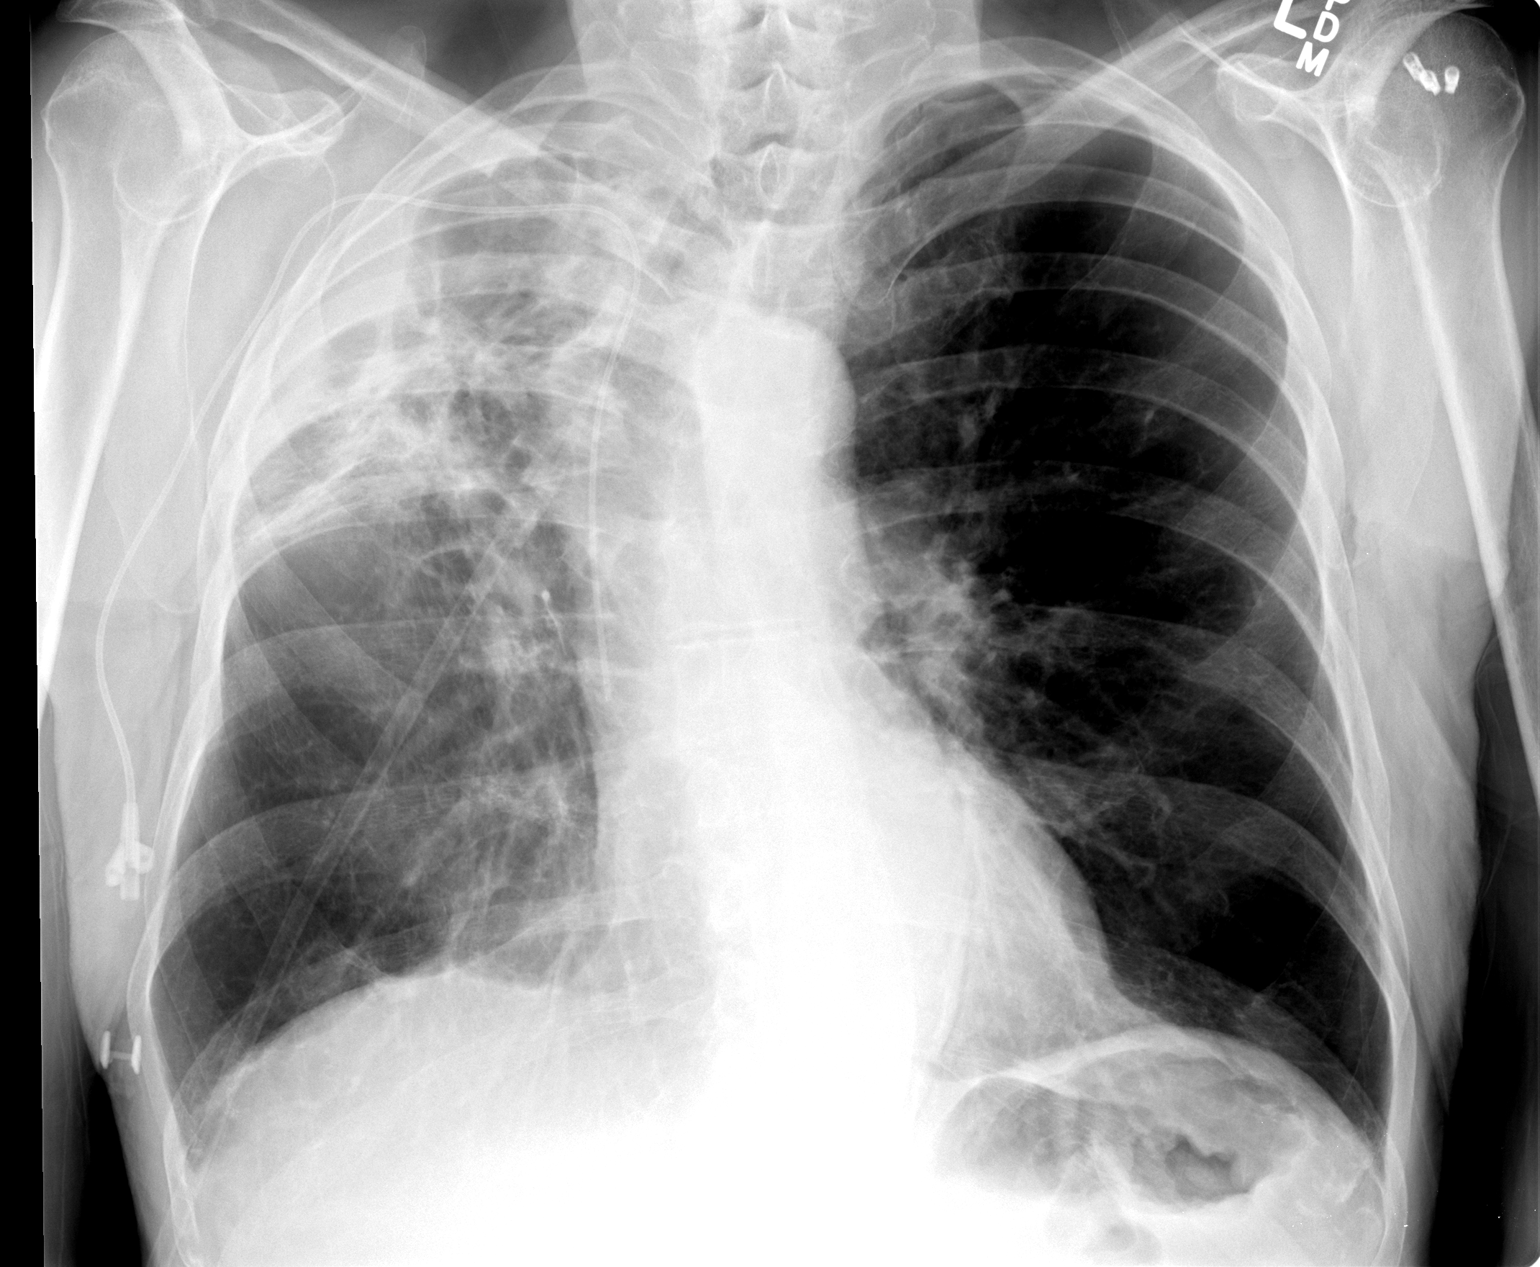

[view not recorded (2 of 2)]
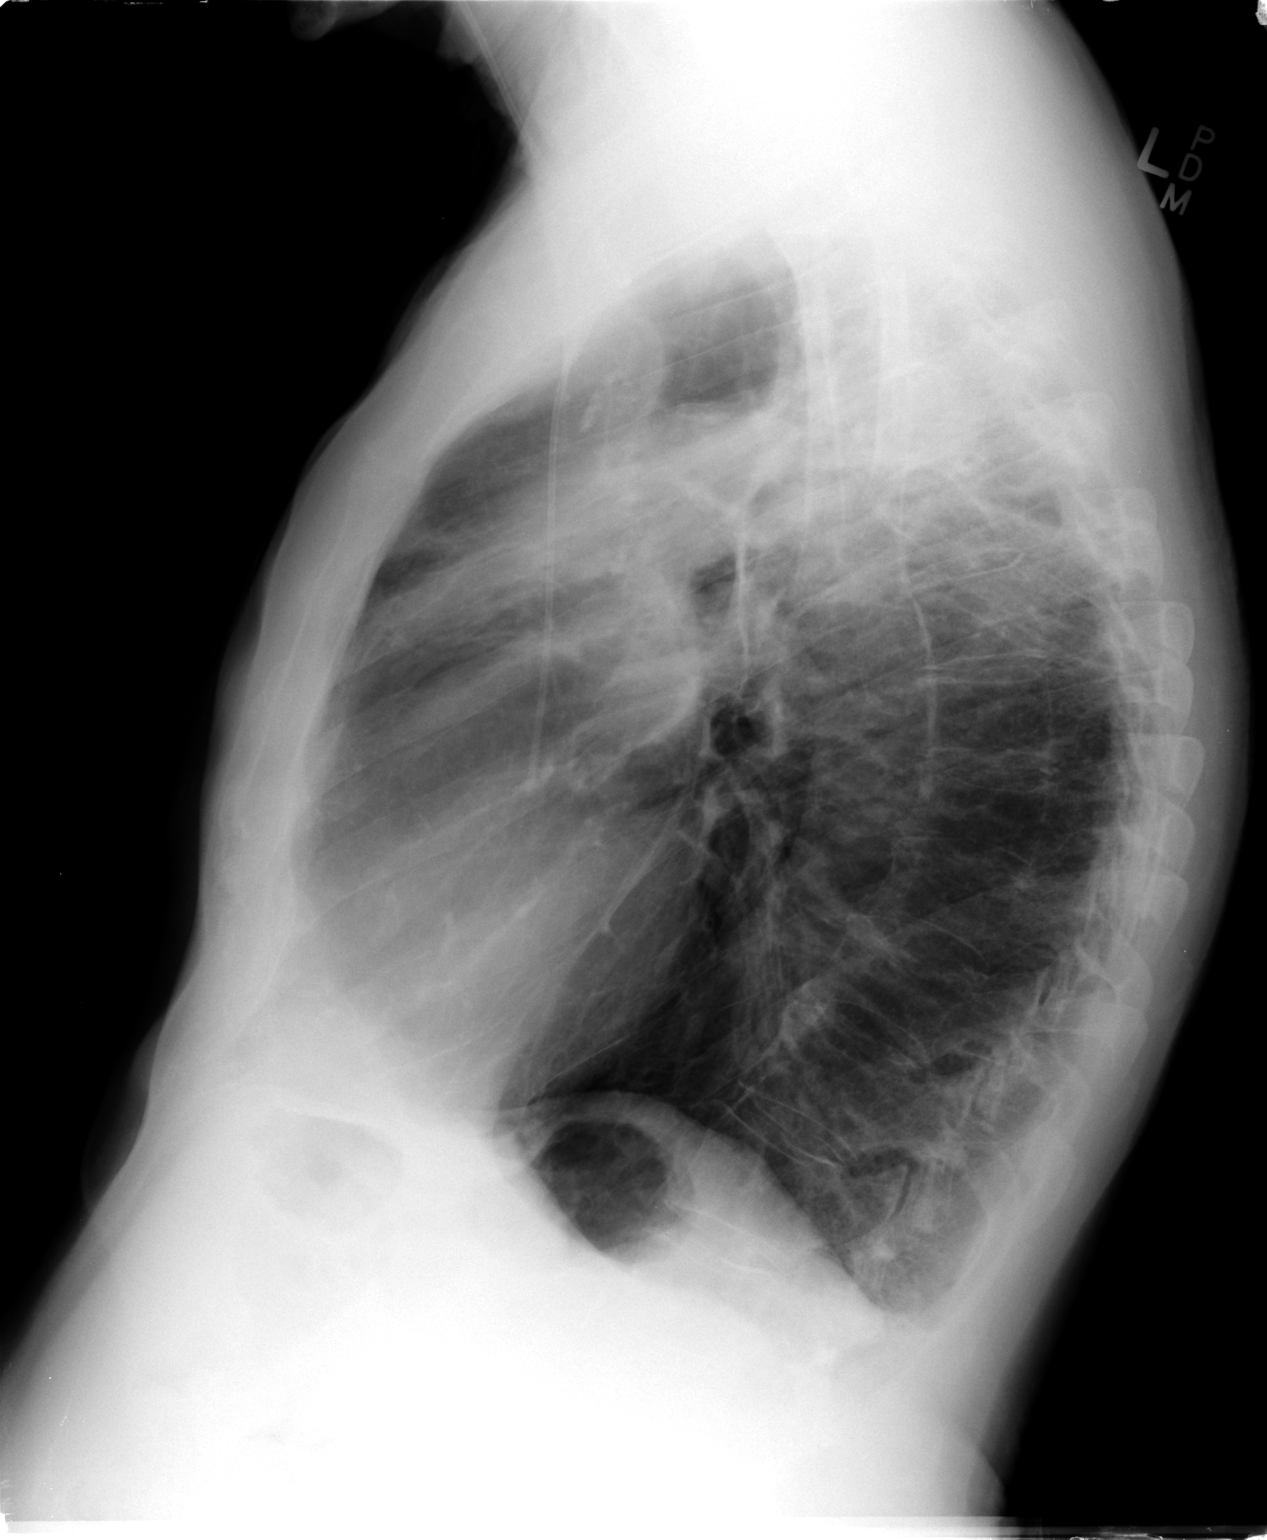

[2 of 2 positions shown; findings below may reference images not displayed]

FINDINGS: The left lung remains hyperinflated and clear. On the right there is
volume loss in the upper lobe with persistent pleural-based and
central increased density. There may have been slight interval
clearing since the previous study. The mid and lower right lung
appear clear. A subclavian catheter is in place with the tip in the
region of the mid to distal SVC. The pulmonary vascularity is not
engorged. The cardiac silhouette is not enlarged. There is gentle
S-shaped thoracolumbar scoliosis which is stable.
IMPRESSION: There has been slight interval improvement in the appearance of the
parenchymal and pleural density in the right upper hemi thorax.
There is no acute abnormality more inferiorly in the right lung nor
within the left lung. There is underlying emphysema. Followup chest
CT scanning would be useful to allow better evaluation of the
persistent abnormalities in the right upper lobe.

## 2013-09-08 NOTE — Assessment & Plan Note (Signed)
RUL necrotizing PNA s/p prolonged course of abx -IV Vanc and Augmentin.  CXR is slightly improved.  Clinically pt is improved and afebril  Will have him return in 1 week is still improving and afebrile will remove PICC.   Plan  Continue on current regimen  Keep PICC line in for now. Follow up in 1 week with chest xray and As needed   Please contact office for sooner follow up if symptoms do not improve or worsen or seek emergency care

## 2013-09-08 NOTE — Patient Instructions (Signed)
Continue on current regimen  Keep PICC line in for now. Follow up in 1 week with chest xray and As needed   Please contact office for sooner follow up if symptoms do not improve or worsen or seek emergency care

## 2013-09-08 NOTE — Progress Notes (Signed)
Ov reviewed with pt and NP.  Agree with plan as outlined.

## 2013-09-08 NOTE — Progress Notes (Signed)
   Subjective:    Patient ID: John Black, male    DOB: 11/15/1928, 79 y.o.   [name]: [id number]  HPI 79 yo with known hx of severe COPD with chronic respiratory failure, as well as a necrotizing pneumonia involving his right upper lobe with significant destruction. He has been on multiple rounds of IV antibiotics with improvement, but been worsening after discontinuation. He was recently in the hospital for 8 days where he received IV vancomycin and Zosyn, and on discharge he was continued on IV Vanco and by mouth Augmentin.  He has now finished Vanc and Augmentin 1 week ago.  CXR today shows slight improvement in aeration on right . He says he feeling much better. Less dyspnea and improved energy. Eating very well. No n/v/d . No fevers.  Has PICC line on there right, home health takes care of dressing changes, no redness or drainage.            Review of Systems Constitutional:   No  weight loss, night sweats,  Fevers, chills, fatigue, or  lassitude.  HEENT:   No headaches,  Difficulty swallowing,  Tooth/dental problems, or  Sore throat,                No sneezing, itching, ear ache, nasal congestion, post nasal drip,   CV:  No chest pain,  Orthopnea, PND, swelling in lower extremities, anasarca, dizziness, palpitations, syncope.   GI  No heartburn, indigestion, abdominal pain, nausea, vomiting, diarrhea, change in bowel habits, loss of appetite, bloody stools.   Resp:    No chest wall deformity  Skin: no rash or lesions.  GU: no dysuria, change in color of urine, no urgency or frequency.  No flank pain, no hematuria   MS:  No joint pain or swelling.  No decreased range of motion.  No back pain.  Psych:  No change in mood or affect. No depression or anxiety.  No memory loss.         Objective:   Physical Exam  GEN: A/Ox3; pleasant , NAD, elderly   HEENT:  [place]/AT,  EACs-clear, TMs-wnl, NOSE-clear, THROAT-clear, no lesions, no postnasal drip or exudate noted.    NECK:  Supple w/ fair ROM; no JVD; normal carotid impulses w/o bruits; no thyromegaly or nodules palpated; no lymphadenopathy.  RESP  Few tr rhonchi on right no accessory muscle use, no dullness to percussion  CARD:  RRR, no m/r/g  , no peripheral edema, pulses intact, no cyanosis or clubbing. R. Arm PICC -dress c/d , no redness   GI:   Soft & nt; nml bowel sounds; no organomegaly or masses detected.  Musco: Warm bil, no deformities or joint swelling noted.   Neuro: alert, no focal deficits noted.    Skin: Warm, no lesions or rashes        Assessment & Plan:

## 2013-09-16 ENCOUNTER — Ambulatory Visit (INDEPENDENT_AMBULATORY_CARE_PROVIDER_SITE_OTHER)
Admission: RE | Admit: 2013-09-16 | Discharge: 2013-09-16 | Disposition: A | Discharge status: Home / Self Care | Payer: Medicare Other | Source: Ambulatory Visit | Attending: Adult Health | Admitting: Adult Health

## 2013-09-16 ENCOUNTER — Ambulatory Visit (INDEPENDENT_AMBULATORY_CARE_PROVIDER_SITE_OTHER): Payer: Medicare Other | Admitting: Adult Health

## 2013-09-16 ENCOUNTER — Encounter: Payer: Self-pay | Admitting: Adult Health

## 2013-09-16 VITALS — BP 108/60 | HR 73 | Temp 98.3°F | Ht 66.0 in | Wt 129.0 lb

## 2013-09-16 DIAGNOSIS — J189 Pneumonia, unspecified organism: Secondary | ICD-10-CM

## 2013-09-16 IMAGING — CR DG CHEST 2V
2 series · 2 of 2 positions shown · non-contrast
Comparison: [DATE] and earlier.

CLINICAL DATA: 84-year-old male with pneumonia, emphysema. Former
smoker. Subsequent encounter.

EXAM:
CHEST  2 VIEW

[view not recorded (1 of 2)]
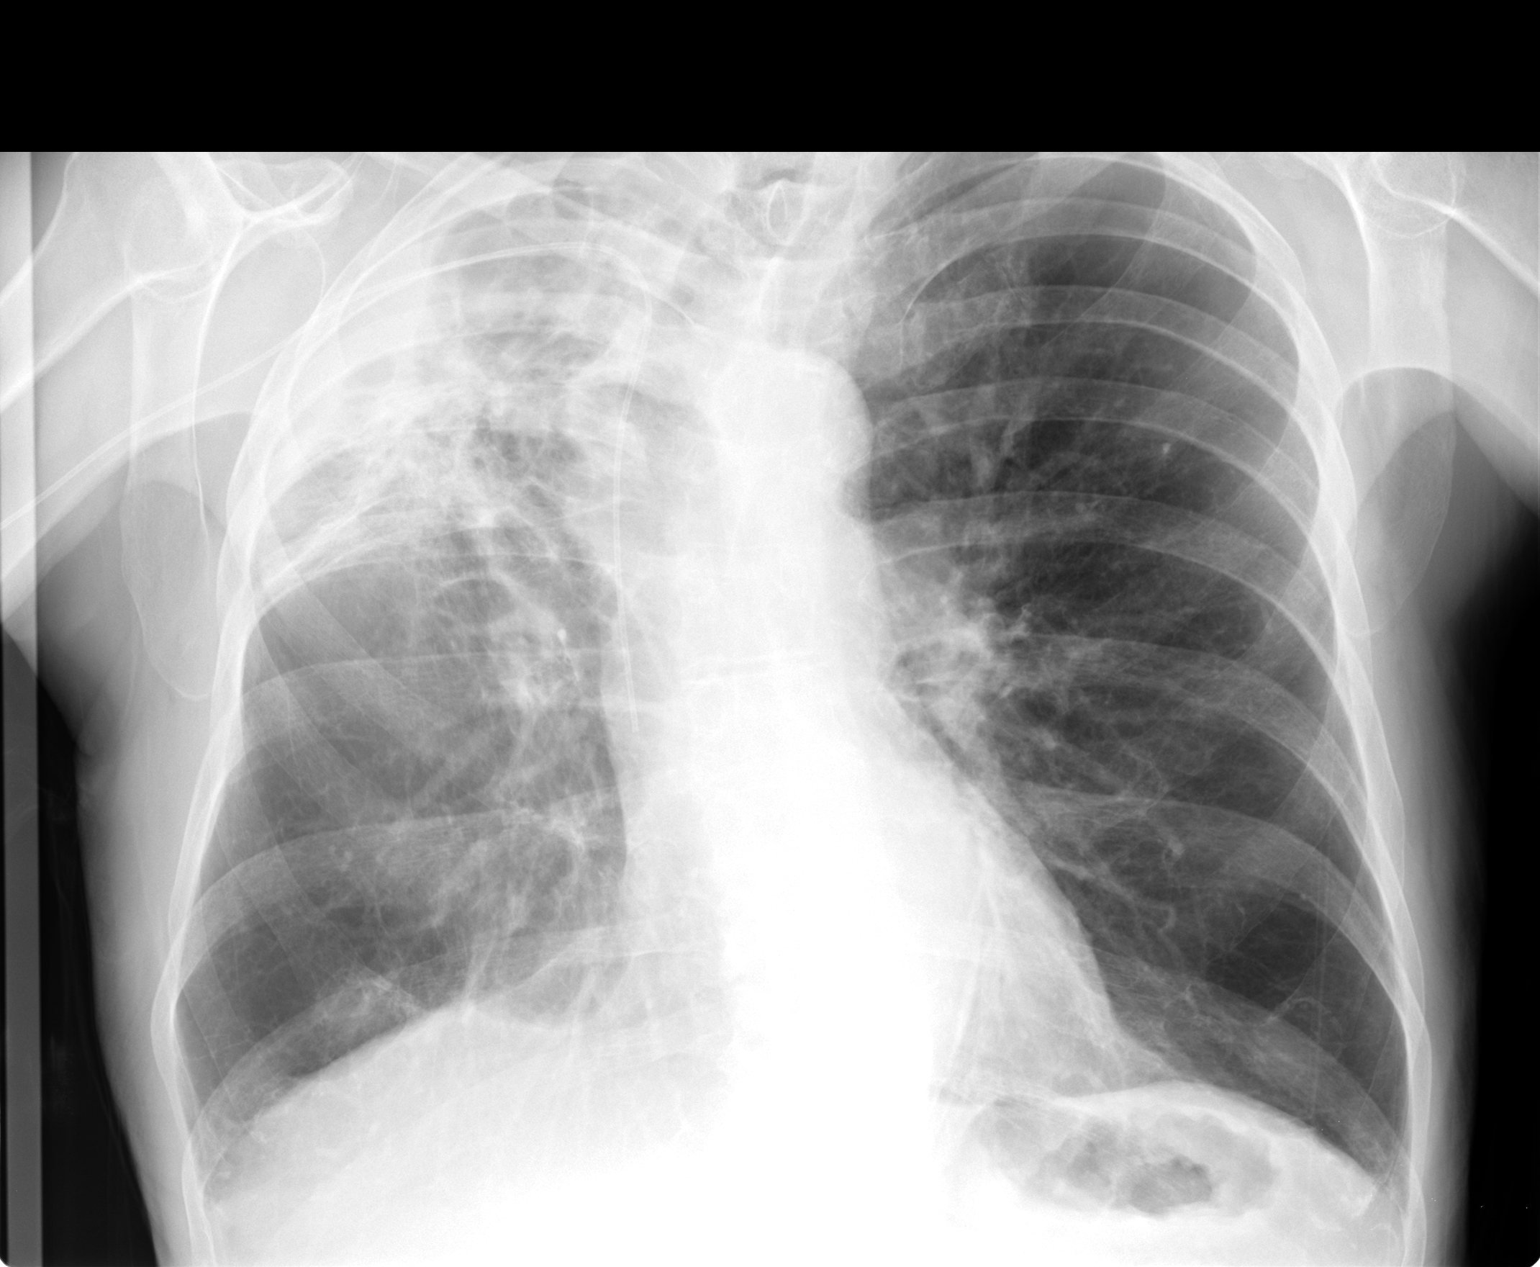

[view not recorded (2 of 2)]
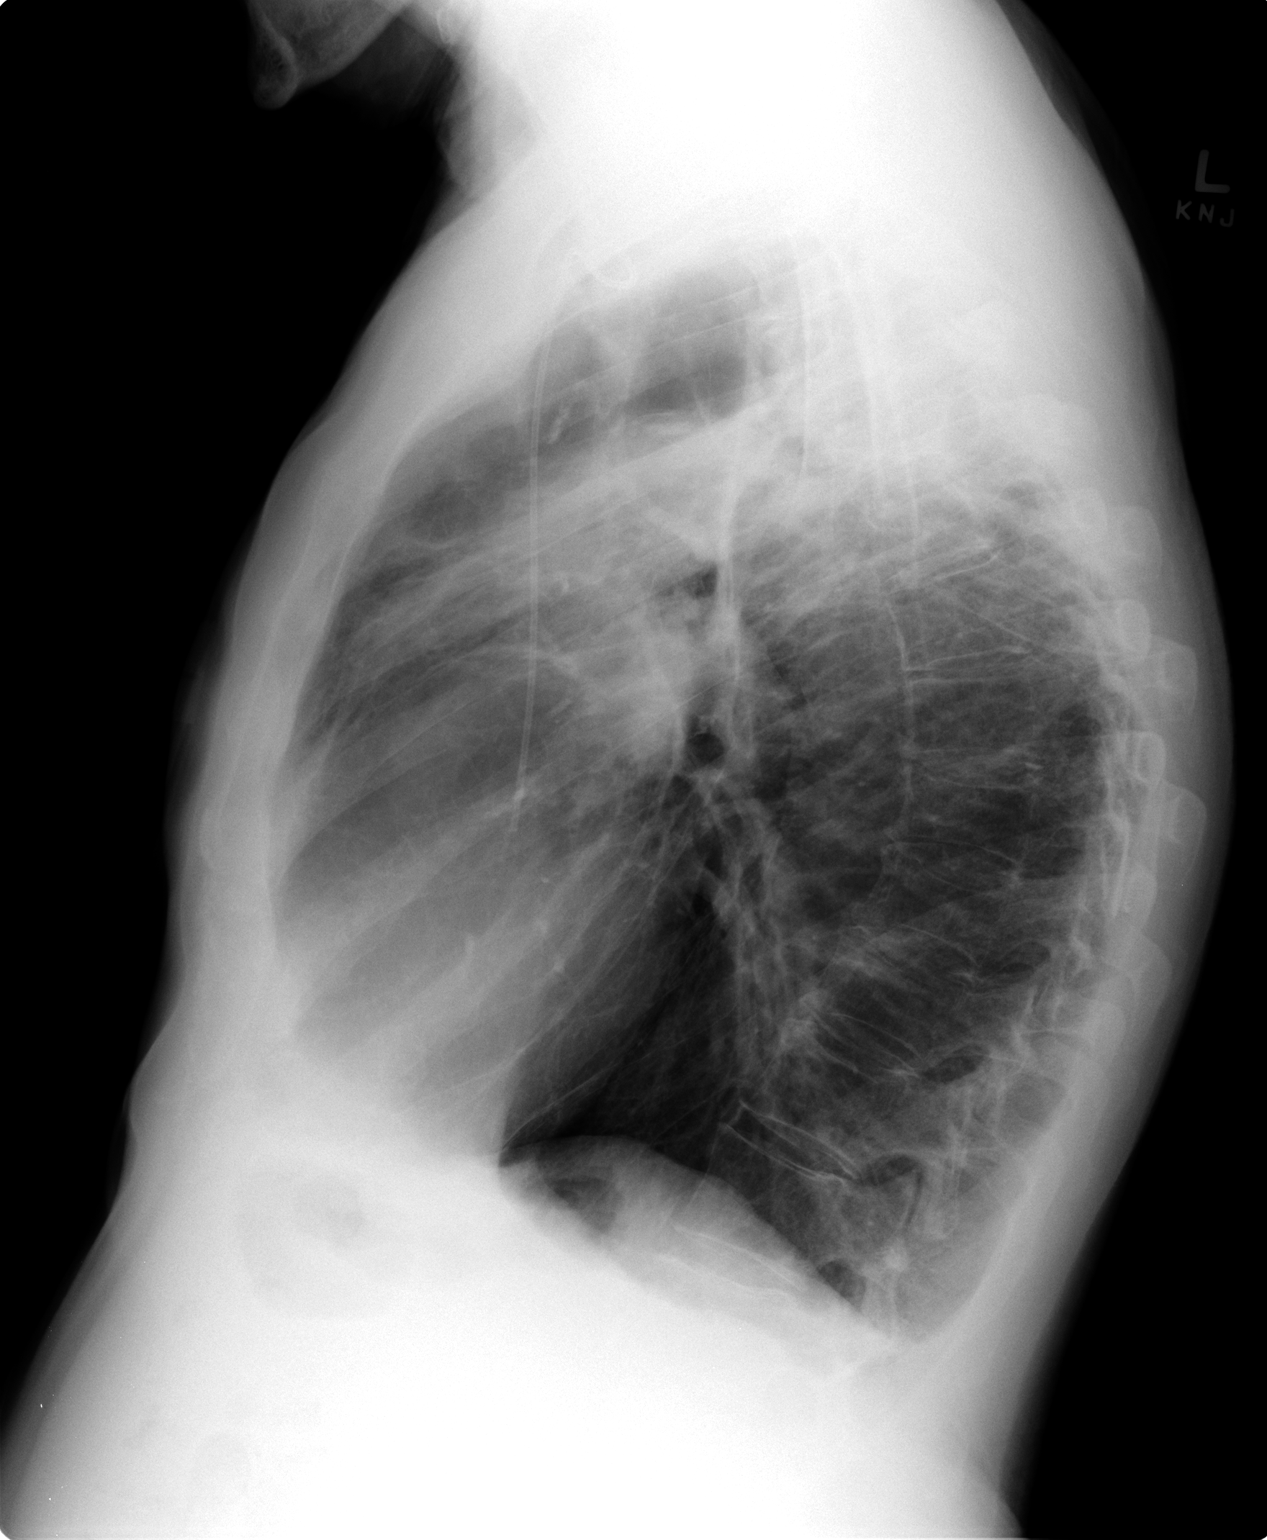

[2 of 2 positions shown; findings below may reference images not displayed]

FINDINGS: Stable right PICC line. Stable lung volumes. Architectural
distortion and subtotal opacification of the right upper lung,
unchanged. No acute pulmonary opacity. No pneumothorax, pleural
effusion or pulmonary edema. Stable cardiac size and mediastinal
contours. Stable visualized osseous structures.
IMPRESSION: Architectural distortion and opacifications the right upper lung not
significantly changed. Underlying chronic lung disease. No new
cardiopulmonary abnormality.

## 2013-09-16 MED ORDER — BUDESONIDE-FORMOTEROL FUMARATE 160-4.5 MCG/ACT IN AERO: 2.0000 | INHALATION_SPRAY | Freq: Two times a day (BID) | RESPIRATORY_TRACT | Status: DC

## 2013-09-16 NOTE — Assessment & Plan Note (Addendum)
RUL PNA - clinically cont to improve  CXR wihotut much change  No further abx at this time PICC line d/c without difficulty  Pt to follow up in 2-3 weeks   Plan  Continue on current regimen  Wash area where PICC IV was gently with soap and water, call if develop redness  Or fever.  follow up Dr. Shelle Iron in 3 weeks and As needed   Please contact office for sooner follow up if symptoms do not improve or worsen or seek emergency care

## 2013-09-16 NOTE — Progress Notes (Signed)
   Subjective:    Patient ID: John Black, male    DOB: 05-09-1929, 79 y.o.   [name]: [id number]  HPI  79 yo with known hx of severe COPD with chronic respiratory failure, as well as a necrotizing pneumonia involving his right upper lobe with significant destruction. He has been on multiple rounds of IV antibiotics with improvement, but been worsening after discontinuation. He was recently in the hospital for 8 days where he received IV vancomycin and Zosyn, and on discharge he was continued on IV Vanco and by mouth Augmentin.  He has now finished Vanc and Augmentin 1 week ago.  CXR today shows slight improvement in aeration on right . He says he feeling much better. Less dyspnea and improved energy. Eating very well. No n/v/d . No fevers.  Has PICC line on there right, home health takes care of dressing changes, no redness or drainage.            Review of Systems  Constitutional:   No  weight loss, night sweats,  Fevers, chills, fatigue, or  lassitude.  HEENT:   No headaches,  Difficulty swallowing,  Tooth/dental problems, or  Sore throat,                No sneezing, itching, ear ache, nasal congestion, post nasal drip,   CV:  No chest pain,  Orthopnea, PND, swelling in lower extremities, anasarca, dizziness, palpitations, syncope.   GI  No heartburn, indigestion, abdominal pain, nausea, vomiting, diarrhea, change in bowel habits, loss of appetite, bloody stools.   Resp:    No chest wall deformity  Skin: no rash or lesions.  GU: no dysuria, change in color of urine, no urgency or frequency.  No flank pain, no hematuria   MS:  No joint pain or swelling.  No decreased range of motion.  No back pain.  Psych:  No change in mood or affect. No depression or anxiety.  No memory loss.         Objective:   Physical Exam   GEN: A/Ox3; pleasant , NAD, elderly   HEENT:  [place]/AT,  EACs-clear, TMs-wnl, NOSE-clear, THROAT-clear, no lesions, no postnasal drip or exudate noted.    NECK:  Supple w/ fair ROM; no JVD; normal carotid impulses w/o bruits; no thyromegaly or nodules palpated; no lymphadenopathy.  RESP  Few tr rhonchi on right no accessory muscle use, no dullness to percussion  CARD:  RRR, no m/r/g  , no peripheral edema, pulses intact, no cyanosis or clubbing. R. Arm PICC -dress c/d , no redness ,   GI:   Soft & nt; nml bowel sounds; no organomegaly or masses detected.  Musco: Warm bil, no deformities or joint swelling noted.   Neuro: alert, no focal deficits noted.    Skin: Warm, no lesions or rashes   Procedure:  PICC line in right antecubital - insertion site clean, no redness  Suture removed x 2 , PICC line removed with catheter intact  Tolerated well, pressure applied, no bleeding . Dressing applied.   CXR 09/16/2013  Architectural distortion and opacifications the right upper lung not  significantly changed. Underlying chronic lung disease. No new  cardiopulmonary abnormality.      Assessment & Plan:

## 2013-09-16 NOTE — Patient Instructions (Signed)
Continue on current regimen  Wash area where PICC IV was gently with soap and water, call if develop redness  Or fever.  follow up Dr. Shelle Iron in 3 weeks and As needed   Please contact office for sooner follow up if symptoms do not improve or worsen or seek emergency care

## 2013-09-21 ENCOUNTER — Ambulatory Visit: Payer: Medicare Other | Admitting: Pulmonary Disease

## 2013-09-25 ENCOUNTER — Ambulatory Visit: Payer: Medicare Other | Admitting: Family Medicine

## 2013-09-25 ENCOUNTER — Telehealth: Payer: Self-pay | Admitting: Pulmonary Disease

## 2013-09-25 DIAGNOSIS — J439 Emphysema, unspecified: Secondary | ICD-10-CM

## 2013-09-25 NOTE — Telephone Encounter (Signed)
[place] with me.  He will need to be assessed for pulsed oxygen to see if he can tolerate. Thanks.

## 2013-09-25 NOTE — Telephone Encounter (Signed)
Order placed and pt is aware. [place] Bing, CMA

## 2013-09-25 NOTE — Telephone Encounter (Signed)
I called and spoke with pt. He saw TP on 09/16/13. He reports prior to this he had spoke with [facility] about changing to a portable system called [employer] one. At that time his insurance [employer] was not under contract with [employer]. Pt reports he has changed over to human effective 09/17/13. They will allow him to go to [employer]. He reports he uses 3 liters O2 all the time mostly since he recently was hospitalized. Please advise [place] if okay to send order? thanks

## 2013-09-28 ENCOUNTER — Telehealth: Payer: Self-pay | Admitting: Pulmonary Disease

## 2013-09-28 ENCOUNTER — Ambulatory Visit (INDEPENDENT_AMBULATORY_CARE_PROVIDER_SITE_OTHER): Payer: Medicare HMO | Admitting: Family Medicine

## 2013-09-28 ENCOUNTER — Encounter: Payer: Self-pay | Admitting: Family Medicine

## 2013-09-28 VITALS — BP 120/80 | HR 98 | Temp 98.3°F | Resp 18 | Wt 129.5 lb

## 2013-09-28 DIAGNOSIS — K649 Unspecified hemorrhoids: Secondary | ICD-10-CM

## 2013-09-28 DIAGNOSIS — B029 Zoster without complications: Secondary | ICD-10-CM

## 2013-09-28 DIAGNOSIS — J439 Emphysema, unspecified: Secondary | ICD-10-CM

## 2013-09-28 MED ORDER — HYDROCORTISONE ACETATE 25 MG RE SUPP: 25.0000 mg | Freq: Every day | RECTAL | Status: DC | PRN

## 2013-09-28 MED ORDER — VALACYCLOVIR HCL 1 G PO TABS: 1000.0000 mg | ORAL_TABLET | Freq: Three times a day (TID) | ORAL | Status: DC

## 2013-09-28 MED ORDER — HYDROCODONE-ACETAMINOPHEN 5-325 MG PO TABS: 1.0000 | ORAL_TABLET | Freq: Four times a day (QID) | ORAL | Status: DC | PRN

## 2013-09-28 NOTE — Assessment & Plan Note (Signed)
Hydrocortisone suppository given

## 2013-09-28 NOTE — Assessment & Plan Note (Signed)
Treat with valtrex, as within 72 hours Hydrocodone given

## 2013-09-28 NOTE — Telephone Encounter (Signed)
Order has been placed. atc pt NA and no VM WCB

## 2013-09-28 NOTE — Progress Notes (Signed)
   Subjective:    Patient ID: John Black, male    DOB: [birth year], 80 y.o.   [name]: [id number]  HPI  Patient here with hemorrhoids as well as rash on his rectum and buttocks for the past 2 days. He states Saturday he began having some pain in his rectal area he thought was his hemorrhoids therefore he used Tucks pads as well as ration age but there was minimal change in the soreness. He did notice some blisters on his buttocks which began to spread throughout Sunday. They're very sore to touch and painful when he sits. He's not had any rectal bleeding. He is status post a prolonged course for a necrotizing pneumonia which required multiple rounds of IV antibiotics in hospital admissions. He states his breathing is doing fairly well. He is using 2-3 L of oxygen  Review of Systems  GEN- denies fatigue, fever, weight loss,weakness, recent illness HEENT- denies eye drainage, change in vision, nasal discharge, CVS- denies chest pain, palpitations RESP- denies SOB, cough, wheeze ABD- denies N/V, change in stools, abd pain Neuro- denies headache, dizziness, syncope, seizure activity      Objective:   Physical Exam  GEN-NAD,alert and oriented x 3 HEENT- PERRL, EOMI, non icteric MMM Neck- no LAD CVS-RRR, no murmur RESP- decreased BS right side, crackles right side, normal WOB with oxygen Skin- Right gluteal cleft erythema with multiple blistering lesions extending along S2/S3, few blistering lesions on underside of penile shaft\ Retum- external hemorroids noted and skin tags Lymph- Right inguinal LAD       Assessment & Plan:

## 2013-09-28 NOTE — Telephone Encounter (Signed)
I spoke with the pt and he states he is now on [place] and has to change from [facility] to [place]. Pt states he is currently using 3 liters oxygen  24/7 due to his recent pneumonia infection. Pt states [facility] advised him to do this until he fully recovers from pneumonia. I do not see this in pt chart. Last documentation states to use 2 liters with exertion only. Please advise how I am to place the order? Thanks. [place] Bing, CMA

## 2013-09-28 NOTE — Telephone Encounter (Signed)
Send order for 3 liters, but let pt know he can use 2-3 liters.

## 2013-09-28 NOTE — Patient Instructions (Signed)
Take the anti-viral medication Use pain medications Cortisone suppository given  F/U Friday as previous

## 2013-09-29 NOTE — Telephone Encounter (Signed)
I called and spoke with pt. He is aware order has been placed. Nothing further needed

## 2013-10-02 ENCOUNTER — Ambulatory Visit (INDEPENDENT_AMBULATORY_CARE_PROVIDER_SITE_OTHER): Payer: Medicare HMO | Admitting: Family Medicine

## 2013-10-02 ENCOUNTER — Encounter: Payer: Self-pay | Admitting: Family Medicine

## 2013-10-02 VITALS — BP 100/90 | HR 90 | Temp 98.7°F | Resp 18 | Ht 64.0 in | Wt 128.0 lb

## 2013-10-02 DIAGNOSIS — J189 Pneumonia, unspecified organism: Secondary | ICD-10-CM

## 2013-10-02 DIAGNOSIS — J439 Emphysema, unspecified: Secondary | ICD-10-CM

## 2013-10-02 DIAGNOSIS — B029 Zoster without complications: Secondary | ICD-10-CM

## 2013-10-02 DIAGNOSIS — R339 Retention of urine, unspecified: Secondary | ICD-10-CM

## 2013-10-02 DIAGNOSIS — J438 Other emphysema: Secondary | ICD-10-CM

## 2013-10-02 DIAGNOSIS — N400 Enlarged prostate without lower urinary tract symptoms: Secondary | ICD-10-CM

## 2013-10-02 DIAGNOSIS — Z8546 Personal history of malignant neoplasm of prostate: Secondary | ICD-10-CM

## 2013-10-02 LAB — CBC WITH DIFFERENTIAL/PLATELET
Basophils Absolute: 0 10*3/uL (ref 0.0–0.1)
Basophils Relative: 0 % (ref 0–1)
Eosinophils Absolute: 0.2 10*3/uL (ref 0.0–0.7)
Eosinophils Relative: 2 % (ref 0–5)
HCT: 38.1 % — ABNORMAL LOW (ref 39.0–52.0)
Hemoglobin: 12.5 g/dL — ABNORMAL LOW (ref 13.0–17.0)
Lymphocytes Relative: 30 % (ref 12–46)
Lymphs Abs: 2.1 10*3/uL (ref 0.7–4.0)
MCH: 26.9 pg (ref 26.0–34.0)
MCHC: 32.8 g/dL (ref 30.0–36.0)
MCV: 82.1 fL (ref 78.0–100.0)
Monocytes Absolute: 0.7 10*3/uL (ref 0.1–1.0)
Monocytes Relative: 11 % (ref 3–12)
Neutro Abs: 4 10*3/uL (ref 1.7–7.7)
Neutrophils Relative %: 57 % (ref 43–77)
Platelets: 309 10*3/uL (ref 150–400)
RBC: 4.64 MIL/uL (ref 4.22–5.81)
RDW: 15.2 % (ref 11.5–15.5)
WBC: 7 10*3/uL (ref 4.0–10.5)

## 2013-10-02 LAB — URINALYSIS, ROUTINE W REFLEX MICROSCOPIC
Bilirubin Urine: NEGATIVE
Glucose, UA: NEGATIVE mg/dL
Ketones, ur: NEGATIVE mg/dL
Leukocytes, UA: NEGATIVE
Nitrite: NEGATIVE
Protein, ur: 30 mg/dL — AB
Specific Gravity, Urine: 1.02 (ref 1.005–1.030)
Urobilinogen, UA: 0.2 mg/dL (ref 0.0–1.0)
pH: 7 (ref 5.0–8.0)

## 2013-10-02 LAB — BASIC METABOLIC PANEL
BUN: 15 mg/dL (ref 6–23)
CO2: 26 mEq/L (ref 19–32)
Calcium: 9.6 mg/dL (ref 8.4–10.5)
Chloride: 99 mEq/L (ref 96–112)
Creat: 0.82 mg/dL (ref 0.50–1.35)
Glucose, Bld: 73 mg/dL (ref 70–99)
Potassium: 4.3 mEq/L (ref 3.5–5.3)
Sodium: 134 mEq/L — ABNORMAL LOW (ref 135–145)

## 2013-10-02 LAB — URINALYSIS, MICROSCOPIC ONLY
Casts: NONE SEEN
Crystals: NONE SEEN

## 2013-10-02 NOTE — Progress Notes (Signed)
   Subjective:    Patient ID: John Black, male    DOB: Sep 23, 1928, 80 y.o.   [name]: [id number]  HPI Pt here for intermin follow-up for shingles, started on valtrex Monday, he then noticed some urinary retention and stopped the meds yesterday after looking up SE online. Since then he has a weak stream but it is improving, he does not feel like he is emptying his bladder all the way  COPD- wearing 3L   BPH- cardura best tolerated by him, in the past was on flomax but this caused hypotension   Review of Systems - per above  GEN- denies fatigue, fever, weight loss,weakness, recent illness HEENT- denies eye drainage, change in vision, nasal discharge, CVS- denies chest pain, palpitations RESP- denies SOB, cough, wheeze ABD- denies N/V, change in stools, abd pain GU- denies dysuria, hematuria, +dribbling, incontinence MSK- denies joint pain, muscle aches, injury Neuro- denies headache, dizziness, syncope, seizure activity      Objective:   Physical Exam GEN-NAD,alert and oriented x 3 HEENT- PERRL, EOMI, non icteric MMM Neck- no LAD CVS-RRR, no murmur RESP- decreased BS right side, crackles right side, normal WOB with oxygen ABD-NABS,soft,NT,ND no suprapubic tenderness, bladder not palpated GU- Foley applied- 125cc urine Skin- Right gluteal cleft erythema with multiple blistering lesions extending along S2/S3, few blistering lesions on underside of penile shaft\ Lymph- Right inguinal LAD (decreased)        Assessment & Plan:

## 2013-10-02 NOTE — Patient Instructions (Signed)
Stop the vatrex Continue current medications Continue cardura 8mg   F/U 3 months

## 2013-10-04 ENCOUNTER — Encounter: Payer: Self-pay | Admitting: Family Medicine

## 2013-10-04 DIAGNOSIS — R339 Retention of urine, unspecified: Secondary | ICD-10-CM | POA: Insufficient documentation

## 2013-10-04 NOTE — Assessment & Plan Note (Signed)
S/p cath 125 cc in bladder, he had voided a few hours before. Valtrex does not typically cause retention but this was the newest medication as he did not really take the pain medication. Will d/c , his stream is improving, off the meds Continue cardura as he had difficulty with flomax and BP is already low normal UA also checked no sign of acute infection, hematuria likley from cath Will monitor through weekened, will hold off on permanent cath for now If needed will send [facility] to home and set up urology

## 2013-10-04 NOTE — Assessment & Plan Note (Signed)
For now d/c valtrex , advised to watch pain medication use as well

## 2013-10-04 NOTE — Assessment & Plan Note (Signed)
Nearing new baseline which requires oxygen 24 hours a day

## 2013-10-05 ENCOUNTER — Telehealth: Payer: Self-pay | Admitting: Family Medicine

## 2013-10-05 MED ORDER — HYDROCODONE-ACETAMINOPHEN 5-325 MG PO TABS: 1.0000 | ORAL_TABLET | Freq: Four times a day (QID) | ORAL | Status: DC | PRN

## 2013-10-05 NOTE — Progress Notes (Signed)
Okay to refill meds Foley catheter to be placed Urgent referral to urology Cardura on board, pt does not tolerate flomax Keep foley in until seen by urology

## 2013-10-05 NOTE — Addendum Note (Signed)
Addended by: Vic Blackbird F on: 10/05/2013 10:22 AM   Modules accepted: Orders

## 2013-10-05 NOTE — Telephone Encounter (Signed)
Pt aware to come and pick up medication

## 2013-10-05 NOTE — Addendum Note (Signed)
Addended by: Daylene Posey T on: 10/05/2013 11:12 AM   Modules accepted: Orders

## 2013-10-05 NOTE — Telephone Encounter (Signed)
Pt is needing a refill on his Hydrocodone  Call back number is [phone]

## 2013-10-07 ENCOUNTER — Ambulatory Visit (INDEPENDENT_AMBULATORY_CARE_PROVIDER_SITE_OTHER): Payer: Medicare HMO | Admitting: Pulmonary Disease

## 2013-10-07 ENCOUNTER — Encounter: Payer: Self-pay | Admitting: Pulmonary Disease

## 2013-10-07 VITALS — BP 116/70 | HR 86 | Temp 98.2°F | Ht 65.0 in | Wt 129.8 lb

## 2013-10-07 DIAGNOSIS — J438 Other emphysema: Secondary | ICD-10-CM

## 2013-10-07 DIAGNOSIS — J189 Pneumonia, unspecified organism: Secondary | ICD-10-CM

## 2013-10-07 DIAGNOSIS — J439 Emphysema, unspecified: Secondary | ICD-10-CM

## 2013-10-07 NOTE — Assessment & Plan Note (Signed)
The patient appears to be doing very well from a COPD standpoint. He feels his breathing is slowly returning to baseline, and he is having no bronchospasm on exam. I asked him to continue with his current medical regimen, and to work on strengthening and conditioning.

## 2013-10-07 NOTE — Patient Instructions (Signed)
No change in breathing medications. Work on Hotel [employer] Once [employer] is established, let us know and we can send order for portable concentrator. followup with me in 40mos with chest xray same day.

## 2013-10-07 NOTE — Progress Notes (Signed)
   Subjective:    Patient ID: John Black, male    DOB: [birth year], 80 y.o.   [name]: [id number]  HPI The patient comes in today for followup of his known COPD, and recent necrotizing right upper lobe pneumonia that was refractory to treatment. He has been treated with multiple rounds of IV antibiotics and also oral antibiotics, and has finally started improving with slow return to baseline. He denies chest congestion, cough, or purulent mucus at this time. He feels well, his appetite has returned, and is slowly getting stronger.   Review of Systems  Constitutional: Negative for fever and unexpected weight change.  HENT: Negative for congestion, dental problem, ear pain, nosebleeds, postnasal drip, rhinorrhea, sinus pressure, sneezing, sore throat and trouble swallowing.   Eyes: Negative for redness and itching.  Respiratory: Negative for cough, chest tightness, shortness of breath and wheezing.   Cardiovascular: Negative for palpitations and leg swelling.  Gastrointestinal: Negative for nausea and vomiting.  Genitourinary: Negative for dysuria.  Musculoskeletal: Negative for joint swelling.       Shingles  Skin: Negative for rash.  Neurological: Negative for headaches.  Hematological: Does not bruise/bleed easily.  Psychiatric/Behavioral: Negative for dysphoric mood. The patient is not nervous/anxious.        Objective:   Physical Exam Thin male in no acute distress Nose without purulence or discharge noted Neck without lymphadenopathy or thyromegaly Chest with crackles in the right upper lung zone, otherwise adequate airflow with no wheezing. Cardiac exam with regular rate and rhythm Lower extremities with no significant edema, no cyanosis Alert and oriented, moves all 4 extremities.       Assessment & Plan:

## 2013-10-07 NOTE — Assessment & Plan Note (Signed)
The patient has finally gotten over his necrotizing pneumonia in the right upper lobe. However, I have told him that we need to monitor him carefully for recurrence. He did have a lot of lung destruction on CT, and probably has some underlying bronchiectasis at this point.

## 2013-10-12 ENCOUNTER — Other Ambulatory Visit: Payer: Self-pay | Admitting: Family Medicine

## 2013-10-12 ENCOUNTER — Telehealth: Payer: Self-pay | Admitting: Family Medicine

## 2013-10-12 MED ORDER — HYDROCODONE-ACETAMINOPHEN 7.5-325 MG PO TABS: 1.0000 | ORAL_TABLET | Freq: Four times a day (QID) | ORAL | Status: DC | PRN

## 2013-10-12 MED ORDER — GABAPENTIN 300 MG PO CAPS: 300.0000 mg | ORAL_CAPSULE | Freq: Every day | ORAL | Status: DC

## 2013-10-12 NOTE — Telephone Encounter (Signed)
PT was given some hydrocodone for his shingles and he is down to his last dosage for today and he is wanting to discuss if there is something else that could work better Call back number is [phone]

## 2013-10-12 NOTE — Telephone Encounter (Signed)
Pt wife came in to talk to me about a list of questions she had for the pt, hydrocodone was refilled stronger dose.

## 2013-10-12 NOTE — Progress Notes (Signed)
Pt wife came in Pain medication not strong enough for the shingles - norco increased to 7.5mg /325mg  ALso with severe burning in buttocks - start gabapentin 300mg  QHS Constipation with pain meds- start miralax once a day , also having urinary retention followed by urology, so prefer to use mild laxative instead of stool softener

## 2013-10-13 ENCOUNTER — Telehealth: Payer: Self-pay | Admitting: Pulmonary Disease

## 2013-10-13 NOTE — Telephone Encounter (Signed)
lmtcb x1 

## 2013-10-14 ENCOUNTER — Ambulatory Visit: Payer: Medicare HMO | Admitting: Podiatry

## 2013-10-14 NOTE — Telephone Encounter (Signed)
LMTCBx2. What are orders for? CMN?  [place] Bing, CMA

## 2013-10-15 NOTE — Telephone Encounter (Signed)
LMTCBx3 for [employer] [place] Bing, CMA

## 2013-10-16 ENCOUNTER — Other Ambulatory Visit: Payer: Self-pay | Admitting: Family Medicine

## 2013-10-16 NOTE — Telephone Encounter (Signed)
?   Ok to refill, was not on med list

## 2013-10-16 NOTE — Telephone Encounter (Signed)
lmtcb x4. Advised to call back if anything further was needed. Will sign off per triage protocol.

## 2013-10-16 NOTE — Telephone Encounter (Signed)
?   Ok to refill, was not on lisdt

## 2013-10-16 NOTE — Telephone Encounter (Signed)
Okay to refill? 

## 2013-10-23 ENCOUNTER — Encounter: Payer: Self-pay | Admitting: Family Medicine

## 2013-10-23 ENCOUNTER — Ambulatory Visit (INDEPENDENT_AMBULATORY_CARE_PROVIDER_SITE_OTHER): Payer: Medicare HMO | Admitting: Family Medicine

## 2013-10-23 VITALS — BP 122/64 | HR 86 | Temp 98.4°F | Resp 20 | Ht 64.0 in | Wt 130.0 lb

## 2013-10-23 DIAGNOSIS — K644 Residual hemorrhoidal skin tags: Secondary | ICD-10-CM

## 2013-10-23 MED ORDER — HYDROCORTISONE 2.5 % RE CREA: 1.0000 "application " | TOPICAL_CREAM | Freq: Two times a day (BID) | RECTAL | Status: DC

## 2013-10-23 NOTE — Progress Notes (Signed)
Subjective:    Patient ID: John Black, male    DOB: April 23, 1929, 80 y.o.   [name]: [id number]  HPI Patient recently had shingles around his rectum and on his right hip and right penis.  This has resolved gradually. However he now has inflamed external hemorrhoids around his rectum that are very tender to sit on. It hurts with defecation. He denies any blood in the stool. He did complain of pain and itching around the rectum.  The visible rash of shingles does not appear to involve the rectum. It is more superior on the right gluteus. Therefore I believe the itching and pain is related to the hemorrhoids and not the previous shingles. Furthermore he is not having pain in the area of shingles. Past Medical History  Diagnosis Date  . Emphysema   . Allergic rhinitis   . Prostate cancer   . Hypothyroid   . Cataract     s/p removal  . PNA (pneumonia)   . Chronic respiratory failure     oxygen 3L at home   Current Outpatient Prescriptions on File Prior to Visit  Medication Sig Dispense Refill  . budesonide-formoterol (SYMBICORT) 160-4.5 MCG/ACT inhaler Inhale 2 puffs into the lungs 2 (two) times daily.  1 Inhaler  6  . Calcium-Magnesium-Vitamin D (CITRACAL CALCIUM+D PO) Take 1 tablet by mouth every morning.       Marland Kitchen doxazosin (CARDURA) 8 MG tablet Take 8 mg by mouth at bedtime.      . gabapentin (NEURONTIN) 300 MG capsule Take 1 capsule (300 mg total) by mouth at bedtime.  30 capsule  3  . HYDROcodone-acetaminophen (NORCO) 7.5-325 MG per tablet Take 1 tablet by mouth every 6 (six) hours as needed for moderate pain.  60 tablet  0  . hydrocortisone (ANUSOL-HC) 25 MG suppository PLACE 1 SUPPOSITORY (25 MG TOTAL) RECTALLY DAILY AS NEEDED FOR HEMORRHOIDS OR ITCHING.  12 suppository  0  . ibuprofen (ADVIL,MOTRIN) 400 MG tablet Take 400 mg by mouth every 6 (six) hours as needed for fever, headache or mild pain.       . Ipratropium-Albuterol (COMBIVENT) 20-100 MCG/ACT AERS respimat Inhale 1 puff into  the lungs every 6 (six) hours as needed for wheezing or shortness of breath.      . levothyroxine (SYNTHROID, LEVOTHROID) 75 MCG tablet Take 1 tablet (75 mcg total) by mouth daily.  90 tablet  1  . Multiple Vitamin (MULTIVITAMIN WITH MINERALS) TABS tablet Take 1 tablet by mouth every morning.      . zolpidem (AMBIEN) 10 MG tablet Take 0.5 tablets (5 mg total) by mouth at bedtime.  30 tablet  0   No current facility-administered medications on file prior to visit.   Allergies  Allergen Reactions  . Morphine     REACTION: sweats   Past Surgical History  Procedure Laterality Date  . Rotator cuff repair  [id number]    bilateral  . Transurethral resection of prostate  2001    x2  . Tonsillectomy    . Adenoidectomy    . Seed implant for prostate cancer  2000      Review of Systems  All other systems reviewed and are negative.       Objective:   Physical Exam  Vitals reviewed. Constitutional: He appears well-developed and well-nourished.  Cardiovascular: Normal rate and normal heart sounds.   Pulmonary/Chest: Effort normal. No respiratory distress. He has wheezes.   rectal exam is significant for several small skin tags and one external  hemorrhoid on the right that is 2 cm x 1 cm and tender to palpation.        Assessment & Plan:  1. External hemorrhoid Recheck in one week if no better or sooner if worse. - hydrocortisone (PROCTOCREAM-HC) 2.5 % rectal cream; Place 1 application rectally 2 (two) times daily.  Dispense: 30 g; Refill: 5

## 2013-10-26 ENCOUNTER — Other Ambulatory Visit: Payer: Self-pay | Admitting: Family Medicine

## 2013-10-26 MED ORDER — DOCUSATE SODIUM 100 MG PO CAPS: 100.0000 mg | ORAL_CAPSULE | Freq: Two times a day (BID) | ORAL | Status: DC

## 2013-10-28 ENCOUNTER — Encounter: Payer: Self-pay | Admitting: Podiatry

## 2013-10-28 ENCOUNTER — Telehealth: Payer: Self-pay | Admitting: Pulmonary Disease

## 2013-10-28 ENCOUNTER — Ambulatory Visit (INDEPENDENT_AMBULATORY_CARE_PROVIDER_SITE_OTHER): Payer: Medicare HMO | Admitting: Podiatry

## 2013-10-28 VITALS — BP 150/82 | HR 94 | Resp 12

## 2013-10-28 DIAGNOSIS — B351 Tinea unguium: Secondary | ICD-10-CM

## 2013-10-28 DIAGNOSIS — M79609 Pain in unspecified limb: Secondary | ICD-10-CM

## 2013-10-28 DIAGNOSIS — J439 Emphysema, unspecified: Secondary | ICD-10-CM

## 2013-10-28 NOTE — Telephone Encounter (Signed)
Ok to send an order to his dme for poc.  Regarding his symptoms, I would like to try and avoid putting him right back on abx/pred unless I know this is a true bacterial infection.  Suspect is a viral process.  Try taking mucinex dm extra strength one bid for next week or so.  If he thinks he is getting worse, he needs to call.

## 2013-10-28 NOTE — Telephone Encounter (Signed)
Order placed to [facility] for [place] in [place] POC 2L/min .Pt aware of recs per [facility] in regards to symptoms. Nothing further needed.

## 2013-10-28 NOTE — Progress Notes (Signed)
   Subjective:    Patient ID: John Black, male    DOB: [birth year], 80 y.o.   [name]: [id number]  Patient requests debridement of painful toenails. No recent podiatric care. HPI    Review of Systems  Respiratory: Positive for chest tightness, shortness of breath and wheezing.   Gastrointestinal: Positive for constipation.  All other systems reviewed and are negative.       Objective:   Physical Exam  80 year old white male with nasal oxygen appears orientated x3.  Vascular: The DP and PT pulses are 2/4 bilaterally  Neurological: Knee and ankle reflexes equal and reactive bilaterally  Dermatological: Atrophic skin noted bilaterally. Hypertrophic, elongated, discolored toenails 6-10  Musculoskeletal: Medium arch contour noted. No restrictions of ankle, midtarsal subtalar joints bilaterally        Assessment & Plan:   Assessment: Symptomatic onychomycoses 6-10  Plan: Nails are debrided back without a bleeding. Reappoint at three-month intervals

## 2013-10-28 NOTE — Telephone Encounter (Signed)
Called spoke with pt. He received his new insurance [employer]. He is ready to get setup with a POC.  Pt c/o chest tx, occas prod cough w/ yellow phlem, severe sore throat (gets better throughout the day). Denies any fever, no body aches. He reports his wife is getting over a chest cold. He wants to know if he needs to see [place] or call his PCP for recs. Please advise KC thanks  Allergies  Allergen Reactions  . Morphine     REACTION: sweats

## 2013-10-29 ENCOUNTER — Telehealth: Payer: Self-pay | Admitting: *Deleted

## 2013-10-29 ENCOUNTER — Encounter: Payer: Self-pay | Admitting: Podiatry

## 2013-10-29 NOTE — Telephone Encounter (Signed)
Wants to know if you can switch his pain meds to something not as stong, states the hydrocodone is too strong for him to take, wants tramodol.

## 2013-10-30 ENCOUNTER — Ambulatory Visit (INDEPENDENT_AMBULATORY_CARE_PROVIDER_SITE_OTHER): Payer: Medicare HMO | Admitting: Adult Health

## 2013-10-30 ENCOUNTER — Encounter: Payer: Self-pay | Admitting: Adult Health

## 2013-10-30 ENCOUNTER — Telehealth: Payer: Self-pay | Admitting: Pulmonary Disease

## 2013-10-30 VITALS — BP 132/76 | HR 114 | Temp 99.0°F | Ht 65.75 in | Wt 125.6 lb

## 2013-10-30 DIAGNOSIS — J438 Other emphysema: Secondary | ICD-10-CM

## 2013-10-30 DIAGNOSIS — J439 Emphysema, unspecified: Secondary | ICD-10-CM

## 2013-10-30 MED ORDER — AMOXICILLIN-POT CLAVULANATE 875-125 MG PO TABS: 1.0000 | ORAL_TABLET | Freq: Two times a day (BID) | ORAL | Status: AC

## 2013-10-30 MED ORDER — TRAMADOL HCL 50 MG PO TABS: 50.0000 mg | ORAL_TABLET | Freq: Three times a day (TID) | ORAL | Status: DC | PRN

## 2013-10-30 NOTE — Patient Instructions (Addendum)
Augmentin 875mg  Twice daily  For 10 days  Mucinex DM Twice daily  As needed  Cough/congestion  Saline nasal rinses As needed   Fluids and rest  Tylenol As needed   Please contact office for sooner follow up if symptoms do not improve or worsen or seek emergency care  follow up Dr. Gwenette Greet as planned and As needed

## 2013-10-30 NOTE — Telephone Encounter (Signed)
I have spoken with pt wife, he tolerates ultram Will send in ultram 50mg 

## 2013-10-30 NOTE — Progress Notes (Signed)
   Subjective:    Patient ID: John Black, male    DOB: [birth year], 80 y.o.   [name]: [id number]  HPI  80 yo with known hx of severe COPD with chronic respiratory failure, as well as a necrotizing pneumonia involving his right upper lobe with significant destruction.    10/30/13 Acute OV  Complains of sinus congestion, sore throat, fatigue X3 days.  Has been taking mucinex, has not helped. Says that his breathing has been fairly stable. Has slight increase in cough, but no wheezing. He denies any fever, or chest pain, orthopnea. Feels that his nose and sinuses are very congested. He denies any hemoptysis, orthopnea, PND, leg swelling.         Review of Systems  Constitutional:   No  weight loss, night sweats,  Fevers, chills, fatigue, or  lassitude.  HEENT:   No headaches,  Difficulty swallowing,  Tooth/dental problems, or  Sore throat,                No sneezing, itching, ear ache,  +nasal congestion, post nasal drip,   CV:  No chest pain,  Orthopnea, PND, swelling in lower extremities, anasarca, dizziness, palpitations, syncope.   GI  No heartburn, indigestion, abdominal pain, nausea, vomiting, diarrhea, change in bowel habits, loss of appetite, bloody stools.   Resp:    No chest wall deformity  Skin: no rash or lesions.  GU: no dysuria, change in color of urine, no urgency or frequency.  No flank pain, no hematuria   MS:  No joint pain or swelling.  No decreased range of motion.  No back pain.  Psych:  No change in mood or affect. No depression or anxiety.  No memory loss.         Objective:   Physical Exam   GEN: A/Ox3; pleasant , NAD, elderly   HEENT:  [place]/AT,  EACs-clear, TMs-wnl, NOSE-clear drainage , max sinus tenderness  THROAT-clear, no lesions, no postnasal drip or exudate noted.   NECK:  Supple w/ fair ROM; no JVD; normal carotid impulses w/o bruits; no thyromegaly or nodules palpated; no lymphadenopathy.  RESP  Diminished BS w/ no wheezing  no  accessory muscle use, no dullness to percussion  CARD:  RRR, no m/r/g  , no peripheral edema, pulses intact, no cyanosis or clubbing.    GI:   Soft & nt; nml bowel sounds; no organomegaly or masses detected.  Musco: Warm bil, no deformities or joint swelling noted.   Neuro: alert, no focal deficits noted.    Skin: Warm, no lesions or rashes     Assessment & Plan:

## 2013-10-30 NOTE — Telephone Encounter (Signed)
Called and spoke with pt. He is scheduled to come in this afternoon and see TP. Nothing further needed

## 2013-11-02 NOTE — Assessment & Plan Note (Signed)
Mild flare with sinusitis   Plan  Augmentin 875mg  Twice daily  For 10 days  Mucinex DM Twice daily  As needed  Cough/congestion  Saline nasal rinses As needed   Fluids and rest  Tylenol As needed   Please contact office for sooner follow up if symptoms do not improve or worsen or seek emergency care  follow up Dr. Gwenette Greet as planned and As needed

## 2013-11-06 ENCOUNTER — Telehealth: Payer: Self-pay | Admitting: Pulmonary Disease

## 2013-11-06 ENCOUNTER — Telehealth: Payer: Self-pay | Admitting: *Deleted

## 2013-11-06 MED ORDER — LEVOTHYROXINE SODIUM 75 MCG PO TABS: 75.0000 ug | ORAL_TABLET | Freq: Every day | ORAL | Status: DC

## 2013-11-06 NOTE — Telephone Encounter (Signed)
Spoke with Pt-- Per Alida all appropriate docs and OV notes have been faxed back to [facility]. Pt aware.

## 2013-11-06 NOTE — Telephone Encounter (Signed)
Meds refilled.

## 2013-11-06 NOTE — Telephone Encounter (Signed)
asthyn---i called this pt since he is wanting to find out about his oxygen order.  He stated that he uses [facility] due to his insurance, but i see that the order was placed to [facility] on 10-28-13.   He spoke with Jill Alexanders at [phone] this morning and she advised the pt that she has sent several forms to [facility] but these were never sent back to her.  Please advise if you have seen these forms so we can order his oxygen through this company.

## 2013-11-09 ENCOUNTER — Telehealth: Payer: Self-pay | Admitting: *Deleted

## 2013-11-09 NOTE — Telephone Encounter (Signed)
Okay to refill? 

## 2013-11-10 ENCOUNTER — Telehealth: Payer: Self-pay | Admitting: Pulmonary Disease

## 2013-11-10 MED ORDER — ZOLPIDEM TARTRATE 10 MG PO TABS: 5.0000 mg | ORAL_TABLET | Freq: Every day | ORAL | Status: DC

## 2013-11-10 NOTE — Telephone Encounter (Signed)
Meds refilled.

## 2013-11-10 NOTE — Telephone Encounter (Signed)
Pt states called [facility] about O2-Dr. Gwenette Greet placed order for this system Pt states they told him they were waiting on O2 sats to qualify, pt states he did not walk at last OV Please advise, KC thanks

## 2013-11-11 NOTE — Telephone Encounter (Signed)
Called spoke with pt. He is scheduled to come in on Monday 11/16/13. Nothing further needed

## 2013-11-11 NOTE — Telephone Encounter (Signed)
We have walking sats from 2013.  If this is not good enough, needs to come in and do walk but no ov.

## 2013-11-16 ENCOUNTER — Ambulatory Visit (INDEPENDENT_AMBULATORY_CARE_PROVIDER_SITE_OTHER): Payer: Medicare HMO | Admitting: Pulmonary Disease

## 2013-11-16 DIAGNOSIS — J438 Other emphysema: Secondary | ICD-10-CM

## 2013-11-16 DIAGNOSIS — J439 Emphysema, unspecified: Secondary | ICD-10-CM

## 2013-11-16 NOTE — Progress Notes (Signed)
Patient came in today for walk test to re-qualify for O2. No OV

## 2013-11-23 ENCOUNTER — Encounter: Payer: Self-pay | Admitting: Family Medicine

## 2013-11-23 ENCOUNTER — Ambulatory Visit (INDEPENDENT_AMBULATORY_CARE_PROVIDER_SITE_OTHER): Payer: Medicare HMO | Admitting: Family Medicine

## 2013-11-23 VITALS — BP 112/64 | HR 93 | Temp 98.0°F | Resp 20 | Ht 63.0 in | Wt 122.5 lb

## 2013-11-23 DIAGNOSIS — B029 Zoster without complications: Secondary | ICD-10-CM

## 2013-11-23 DIAGNOSIS — R634 Abnormal weight loss: Secondary | ICD-10-CM

## 2013-11-23 DIAGNOSIS — B0229 Other postherpetic nervous system involvement: Secondary | ICD-10-CM

## 2013-11-23 DIAGNOSIS — G4700 Insomnia, unspecified: Secondary | ICD-10-CM

## 2013-11-23 DIAGNOSIS — K649 Unspecified hemorrhoids: Secondary | ICD-10-CM

## 2013-11-23 MED ORDER — LIDOCAINE (ANORECTAL) 5 % EX CREA: TOPICAL_CREAM | CUTANEOUS | Status: DC

## 2013-11-23 NOTE — Patient Instructions (Signed)
Try the lidocaine topical for the rectal area Continue stool softener and miralax Increase ensure to twice a day Melatonin at bedtime then try the [place] Call in 1 week to see how your sleeping is doing F/U in APril

## 2013-11-24 ENCOUNTER — Other Ambulatory Visit: Payer: Self-pay | Admitting: *Deleted

## 2013-11-24 DIAGNOSIS — B0229 Other postherpetic nervous system involvement: Secondary | ICD-10-CM | POA: Insufficient documentation

## 2013-11-24 MED ORDER — LIDOCAINE (ANORECTAL) 5 % EX CREA: TOPICAL_CREAM | CUTANEOUS | Status: DC

## 2013-11-24 NOTE — Assessment & Plan Note (Signed)
Patient has chronic insomnia. He's currently on Ambein and not sleeping well with this. We will try adding melatonin to his regimen with Ambien 5 mg. If this does not improve I will try him on temazepam 15 mg at bedtime. He has failed trazodone

## 2013-11-24 NOTE — Telephone Encounter (Signed)
Call placed to [facility]. Medication is available.  Call placed to patient and patient made aware.

## 2013-11-24 NOTE — Assessment & Plan Note (Signed)
I do not see any external hemorrhoids at this time and no prolapse of any internal hemorrhoids. He does have some pox-like lesions still around the rectal area extending from his previous area of shingles. I would try him on topical anorectal lidocaine for the discomfort. He has no bleeding or true pain, continue stool softeners

## 2013-11-24 NOTE — Assessment & Plan Note (Signed)
She has lost some weight since her last visit. He's not eating as well. Before starting any medications he wants to work on his diet and increase his Ensure. Will give him a few weeks to work on this he does have an appointment with his pulmonologist in one month. If he does not improve I would start the Remeron at bedtime

## 2013-11-24 NOTE — Assessment & Plan Note (Signed)
Shingles on buttocks in Jan, topical lidocaine to anorectal region

## 2013-11-24 NOTE — Progress Notes (Signed)
Patient ID: John Black, male   DOB: 05-21-1929, 80 y.o.   [name]: [id number]   Subjective:    Patient ID: John Black, male    DOB: 08/27/1929, 80 y.o.   [name]: [id number]  Patient presents for F/U  patient here to followup hemorrhoids and shingles. He was diagnosed with shingles outbreak on his buttocks that extended into the rectal area in January. Most of the lesions have now healed up but he still hasn't discomfort around the rectal area he was not sure if this was due to the shingles or his hemorrhoids. He did use a course of hydrocortisone suppositories and then a topical Proctosol which in prove his hemorrhoidal pain and states it does have sure but he still has soreness and he can feel it when he sits. He's not had any blood in his stool and is using stool softener Colace twice a day. He also uses MiraLAX if he has severe constipation.  He is currently on Rapaflo given by urology and doing well on this but the cost may be an issue.  Chronic insomnia-he is falling asleep at this Ambien but tends to wake up after a few hours and has difficulty getting back to sleep. He did try trazodone but did not do well with that medication. He also has a prior approval for his Ambien today  COPD/emphysema-he is doing well he now has his new oxygen concentrator he'll followup with pulmonary next month     Review Of Systems: - per above  GEN- denies fatigue, fever, weight loss,weakness, recent illness HEENT- denies eye drainage, change in vision, nasal discharge, CVS- denies chest pain, palpitations RESP- denies SOB, cough, wheeze ABD- denies N/V, change in stools, abd pain GU- denies dysuria, hematuria, dribbling, incontinence MSK- denies joint pain, muscle aches, injury Neuro- denies headache, dizziness, syncope, seizure activity       Objective:    BP 112/64  Pulse 93  Temp(Src) 98 F (36.7 C)  Resp 20  Ht 5\' 3"  (1.6 m)  Wt 122 lb 8 oz (55.566 kg)  BMI 21.71 kg/m2  SpO2  98% GEN- NAD, alert and oriented x3 HEENT- PERRL, EOMI, non injected sclera, pink conjunctiva, MMM, oropharynx clear CVS- RRR, no murmur RESP-CTAB, decreased BS at bases, no wheeze Skin- macular lesions (previous pox site), few papular lesions around anus, multiple skin tags, no ulcerations EXT- No edema Pulses- Radial 2+        Assessment & Plan:      Problem List Items Addressed This Visit   Shingles   Post herpetic neuralgia     Shingles on buttocks in Jan, topical lidocaine to anorectal region    Loss of weight     She has lost some weight since her last visit. He's not eating as well. Before starting any medications he wants to work on his diet and increase his Ensure. Will give him a few weeks to work on this he does have an appointment with his pulmonologist in one month. If he does not improve I would start the Remeron at bedtime    Insomnia     Patient has chronic insomnia. He's currently on Ambein and not sleeping well with this. We will try adding melatonin to his regimen with Ambien 5 mg. If this does not improve I will try him on temazepam 15 mg at bedtime. He has failed trazodone    Hemorrhoids - Primary     I do not see any external hemorrhoids at this time and no  prolapse of any internal hemorrhoids. He does have some pox-like lesions still around the rectal area extending from his previous area of shingles. I would try him on topical anorectal lidocaine for the discomfort. He has no bleeding or true pain, continue stool softeners       Note: This dictation was prepared with Dragon dictation along with smaller phrase technology. Any transcriptional errors that result from this process are unintentional.

## 2013-12-09 ENCOUNTER — Encounter: Payer: Self-pay | Admitting: Pulmonary Disease

## 2013-12-09 ENCOUNTER — Ambulatory Visit (INDEPENDENT_AMBULATORY_CARE_PROVIDER_SITE_OTHER): Payer: Medicare HMO | Admitting: Pulmonary Disease

## 2013-12-09 VITALS — BP 108/78 | HR 79 | Temp 98.1°F | Ht 65.0 in | Wt 125.6 lb

## 2013-12-09 DIAGNOSIS — J9611 Chronic respiratory failure with hypoxia: Secondary | ICD-10-CM | POA: Insufficient documentation

## 2013-12-09 DIAGNOSIS — J438 Other emphysema: Secondary | ICD-10-CM

## 2013-12-09 DIAGNOSIS — J439 Emphysema, unspecified: Secondary | ICD-10-CM

## 2013-12-09 DIAGNOSIS — J9610 Chronic respiratory failure, unspecified whether with hypoxia or hypercapnia: Secondary | ICD-10-CM

## 2013-12-09 NOTE — Progress Notes (Signed)
   Subjective:    Patient ID: John Black, male    DOB: [birth year], 80 y.o.   [name]: [id number]  HPI Patient comes in today for followup of his known severe COPD with chronic respiratory failure. He is also had issues with a necrotizing right upper lobe pneumonia with severe destruction. He has finally improved from that standpoint after many weeks of IV antibiotics. He currently is not congested, and does not have a cough with purulent mucus. He feels that his breathing is slowly returning to baseline, but is very deconditioned. He has been referred to a pulmonary rehabilitation program, but has been on the waiting list for at least 3 months.  Review of Systems  Constitutional: Negative for fever and unexpected weight change.  HENT: Negative for congestion, dental problem, ear pain, nosebleeds, postnasal drip, rhinorrhea, sinus pressure, sneezing, sore throat and trouble swallowing.   Eyes: Negative for redness and itching.  Respiratory: Negative for cough, chest tightness, shortness of breath and wheezing.   Cardiovascular: Negative for palpitations and leg swelling.  Gastrointestinal: Negative for nausea and vomiting.  Genitourinary: Negative for dysuria.  Musculoskeletal: Negative for joint swelling.  Skin: Negative for rash.  Neurological: Negative for headaches.  Hematological: Does not bruise/bleed easily.  Psychiatric/Behavioral: Negative for dysphoric mood. The patient is not nervous/anxious.        Objective:   Physical Exam Thin male in no acute distress Nose without purulence or discharge noted Neck without lymphadenopathy or thyromegaly Chest with decreased breath sounds, no wheezes or rhonchi Cardiac exam with regular rate and rhythm Lower extremities without edema, no cyanosis Alert and oriented, moves all 4 extremities.       Assessment & Plan:

## 2013-12-09 NOTE — Assessment & Plan Note (Signed)
The patient seems to be doing well from a pulmonary standpoint, and has no further chest congestion or purulent mucus. It appears that he has finally gotten over his severe necrotizing pneumonia, and he feels that he is back to baseline except for his muscular weakness. I've asked him to continue on his current medications and oxygen, and we'll see if we can expedite his enrollment in a pulmonary rehabilitation at [facility]

## 2013-12-09 NOTE — Patient Instructions (Signed)
Will send a note to [place] [facility] to try and get you into the program Stay as active as possible. No change in medications followup with me in 4 mos, but call if having issues.

## 2013-12-12 ENCOUNTER — Encounter (HOSPITAL_COMMUNITY): Payer: Self-pay | Admitting: Emergency Medicine

## 2013-12-12 ENCOUNTER — Emergency Department (HOSPITAL_COMMUNITY): Payer: Medicare HMO

## 2013-12-12 ENCOUNTER — Emergency Department (HOSPITAL_COMMUNITY)
Admission: EM | Admit: 2013-12-12 | Discharge: 2013-12-12 | Disposition: A | Discharge status: Home / Self Care | Payer: Medicare HMO | Attending: Emergency Medicine | Admitting: Emergency Medicine

## 2013-12-12 DIAGNOSIS — IMO0002 Reserved for concepts with insufficient information to code with codable children: Secondary | ICD-10-CM | POA: Insufficient documentation

## 2013-12-12 DIAGNOSIS — Z79899 Other long term (current) drug therapy: Secondary | ICD-10-CM | POA: Insufficient documentation

## 2013-12-12 DIAGNOSIS — Y9389 Activity, other specified: Secondary | ICD-10-CM | POA: Insufficient documentation

## 2013-12-12 DIAGNOSIS — S63509A Unspecified sprain of unspecified wrist, initial encounter: Secondary | ICD-10-CM | POA: Insufficient documentation

## 2013-12-12 DIAGNOSIS — W1809XA Striking against other object with subsequent fall, initial encounter: Secondary | ICD-10-CM | POA: Insufficient documentation

## 2013-12-12 DIAGNOSIS — Y929 Unspecified place or not applicable: Secondary | ICD-10-CM | POA: Insufficient documentation

## 2013-12-12 DIAGNOSIS — Z8669 Personal history of other diseases of the nervous system and sense organs: Secondary | ICD-10-CM | POA: Insufficient documentation

## 2013-12-12 DIAGNOSIS — S63502A Unspecified sprain of left wrist, initial encounter: Secondary | ICD-10-CM

## 2013-12-12 DIAGNOSIS — Z8546 Personal history of malignant neoplasm of prostate: Secondary | ICD-10-CM | POA: Insufficient documentation

## 2013-12-12 DIAGNOSIS — Z9981 Dependence on supplemental oxygen: Secondary | ICD-10-CM | POA: Insufficient documentation

## 2013-12-12 DIAGNOSIS — Z8701 Personal history of pneumonia (recurrent): Secondary | ICD-10-CM | POA: Insufficient documentation

## 2013-12-12 DIAGNOSIS — Z87891 Personal history of nicotine dependence: Secondary | ICD-10-CM | POA: Insufficient documentation

## 2013-12-12 DIAGNOSIS — J438 Other emphysema: Secondary | ICD-10-CM | POA: Insufficient documentation

## 2013-12-12 DIAGNOSIS — E039 Hypothyroidism, unspecified: Secondary | ICD-10-CM | POA: Insufficient documentation

## 2013-12-12 DIAGNOSIS — J9610 Chronic respiratory failure, unspecified whether with hypoxia or hypercapnia: Secondary | ICD-10-CM | POA: Insufficient documentation

## 2013-12-12 IMAGING — CR DG WRIST COMPLETE 3+V*L*
4 series · 4 of 4 positions shown · non-contrast
Comparison: None.

CLINICAL DATA: Wrist pain.

EXAM:
LEFT WRIST - COMPLETE 3+ VIEW

[view not recorded (1 of 4)]
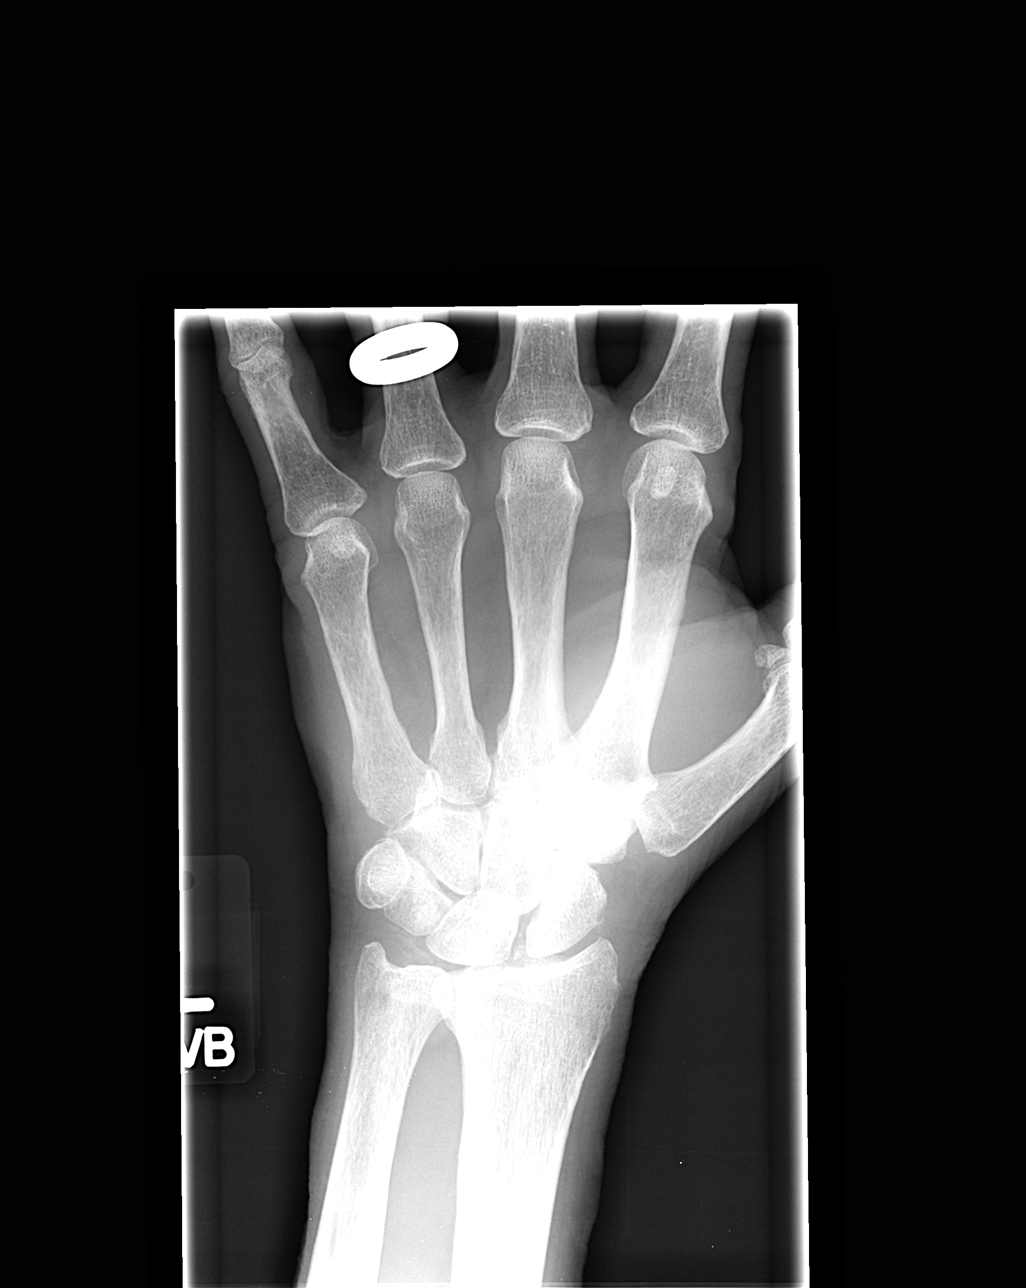

[view not recorded (2 of 4)]
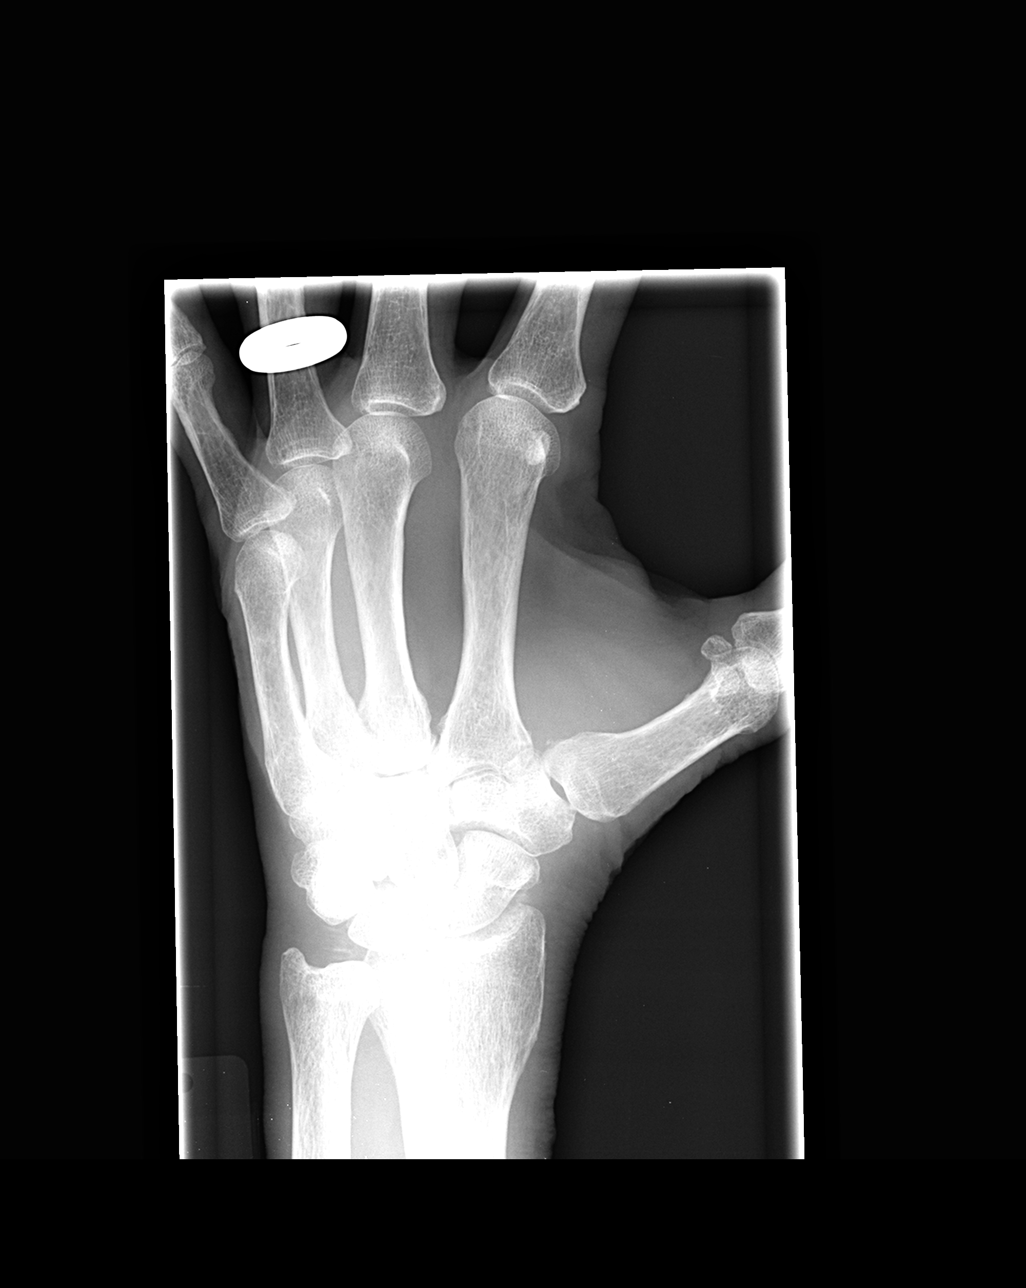

[view not recorded (3 of 4)]
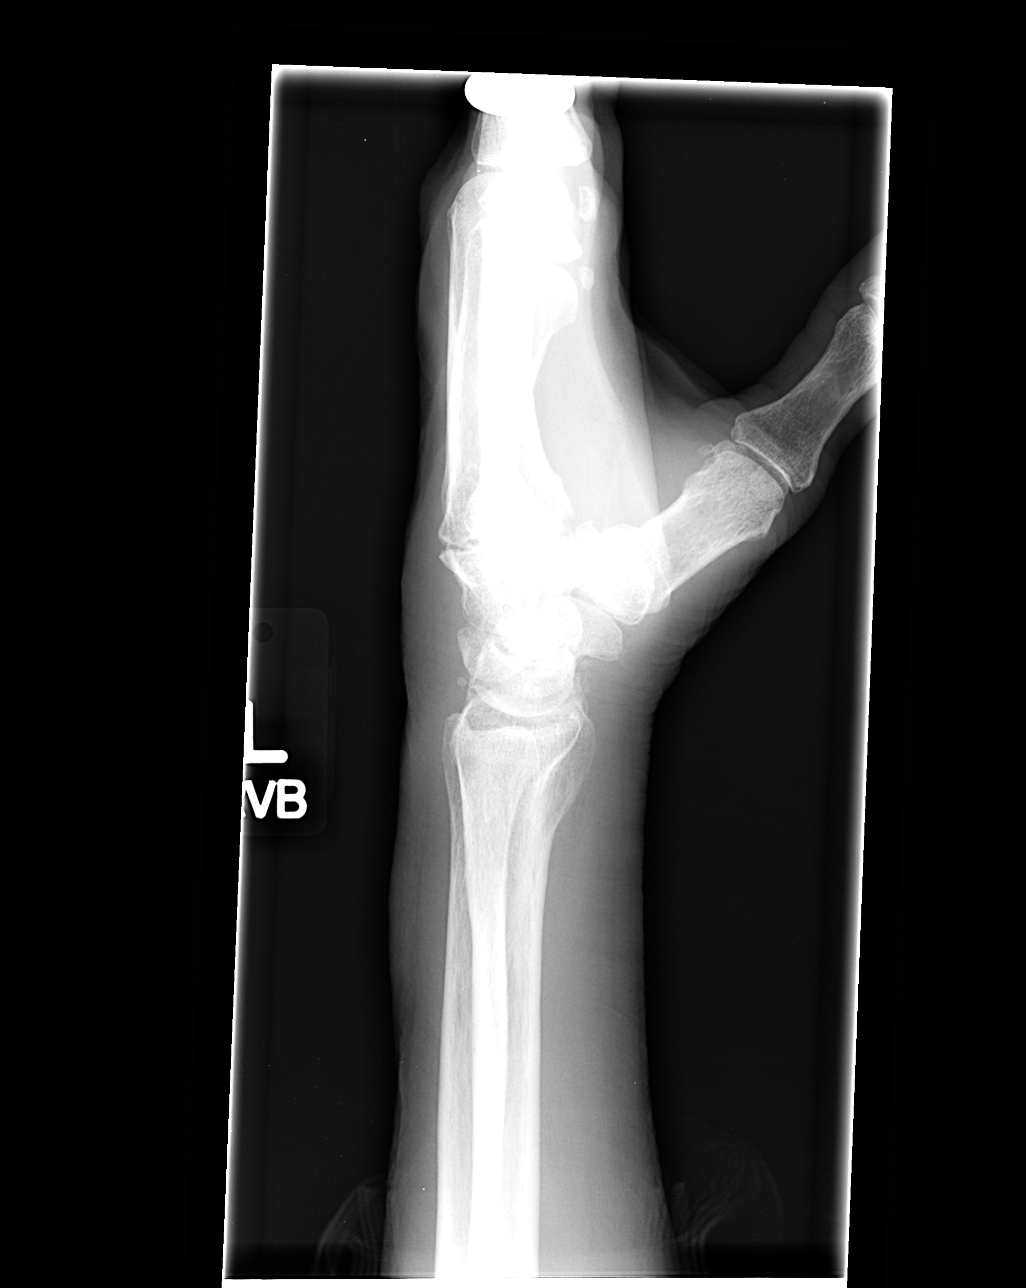

[view not recorded (4 of 4)]
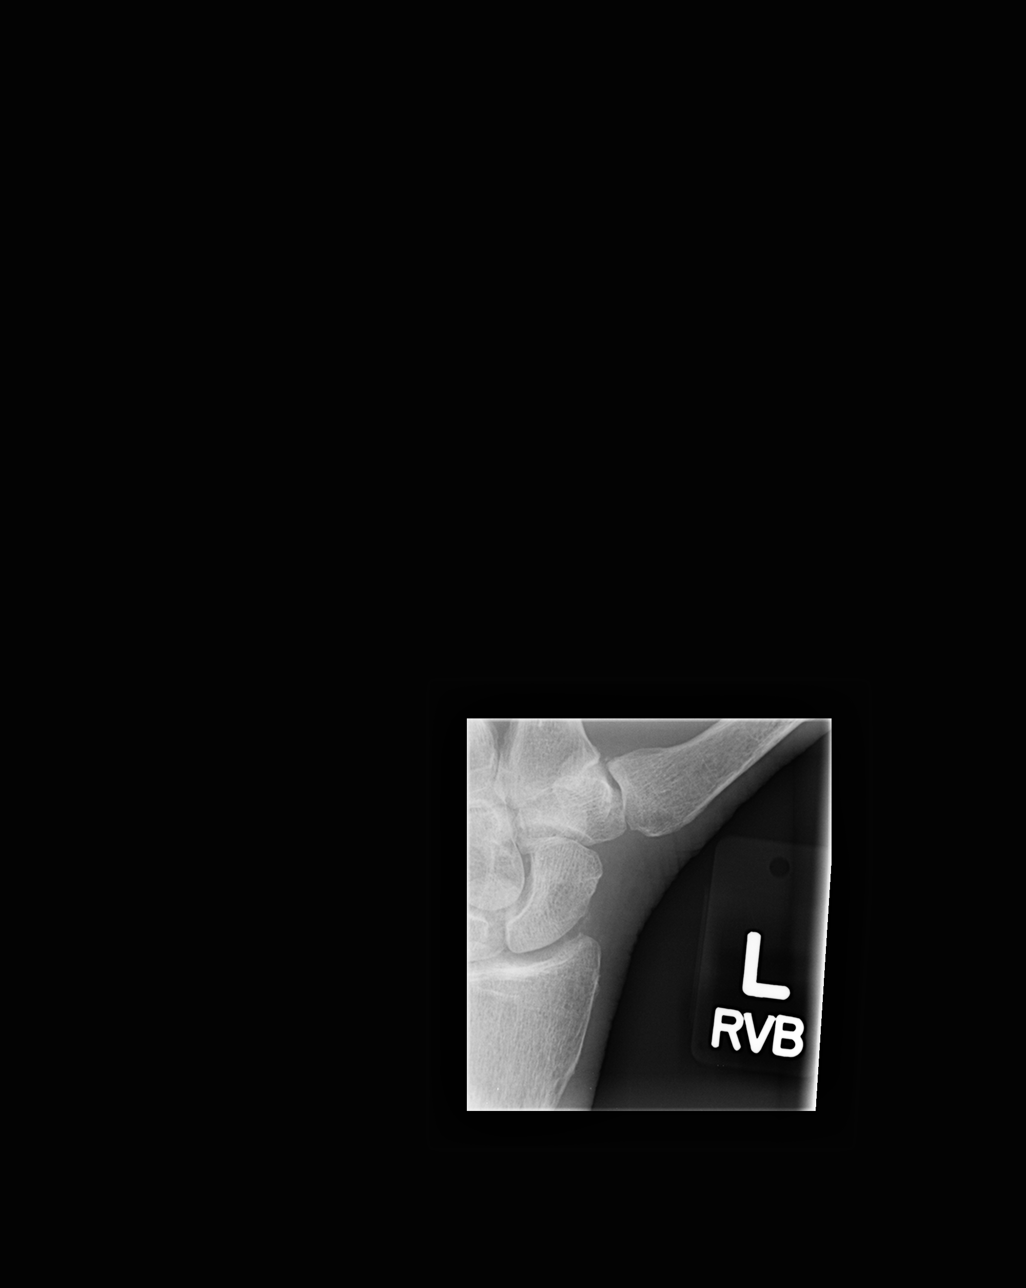

[4 of 4 positions shown; findings below may reference images not displayed]

FINDINGS: Posterior soft tissue swelling. Calcifications in the triangular
fibrocartilage. No acute bony abnormality. No fracture, subluxation
or dislocation.
IMPRESSION: No acute bony abnormality.

## 2013-12-12 MED ORDER — HYDROCODONE-ACETAMINOPHEN 5-325 MG PO TABS: ORAL_TABLET | ORAL | Status: DC

## 2013-12-12 NOTE — ED Notes (Signed)
Pt was moving furniture to catch a mouse when he lost his balance causing him to fall against the wall, c/o pain to left wrist area, pt has ace wrap in place to left wrist on arrival to er. Cms intact distal, good cap refill noted,

## 2013-12-12 NOTE — Discharge Instructions (Signed)
Sprain A sprain happens when the bands of tissue that connect bones and hold joints together (ligaments) stretch too much or tear. HOME CARE  Raise (elevate) the injured area to lessen puffiness (swelling).  Put ice on the injured area 2 times a day for 2 3 days.  Put ice in a plastic bag.  Place a towel between your skin and the bag.  Leave the ice on for 15 minutes.  Only take medicine as told by your doctor.  Protect your injured area until your pain and stiffness go away.  Do not get your cast or splint wet. Cover your cast or splint with a plastic bag when you shower or take a bath. Do not swim in a pool.  Your doctor may suggest exercises during your recovery to keep from getting stiff. GET HELP RIGHT AWAY IF:   Your cast or splint becomes damaged.  Your pain gets worse. MAKE SURE YOU:   Understand these instructions.  Will watch this condition.  Will get help right away if you are not doing well or get worse. Document Released: 02/20/2008 Document Revised: 06/24/2013 Document Reviewed: 09/15/2011 [facility] Patient Information 2014 [place], [place].  Wrist Pain A wrist sprain happens when the bands of tissue that hold the wrist joints together (ligament) stretch too much or tear. A wrist strain happens when muscles or bands of tissue that connect muscles to bones (tendons) are stretched or pulled. HOME CARE  Put ice on the injured area.  Put ice in a plastic bag.  Place a towel between your skin and the bag.  Leave the ice on for 15-20 minutes, 03-04 times a day, for the first 2 days.  Raise (elevate) the injured wrist to lessen puffiness (swelling).  Rest the injured wrist for at least 48 hours or as told by your doctor.  Wear a splint, cast, or an elastic wrap as told by your doctor.  Only take medicine as told by your doctor.  Follow up with your doctor as told. This is important. GET HELP RIGHT AWAY IF:   The fingers are puffy, very red, white, or  cold and blue.  The fingers lose feeling (numb) or tingle.  The pain gets worse.  It is hard to move the fingers. MAKE SURE YOU:   Understand these instructions.  Will watch your condition.  Will get help right away if you are not doing well or get worse. Document Released: 02/20/2008 Document Revised: 11/26/2011 Document Reviewed: 10/25/2010 [facility] Patient Information 2014 [place].

## 2013-12-14 NOTE — ED Provider Notes (Signed)
CSN: [id number]     Arrival date & time 12/12/13  1125 History   First MD Initiated Contact with Patient 12/12/13 1234     Chief Complaint  Patient presents with  . Wrist Pain     (Consider location/radiation/quality/duration/timing/severity/associated sxs/prior Treatment) Patient is a 80 y.o. male presenting with wrist pain. The history is provided by the patient.  Wrist Pain This is a new problem. The current episode started in the past 7 days. The problem occurs constantly. The problem has been unchanged. Associated symptoms include arthralgias and joint swelling. Pertinent negatives include no chest pain, chills, fever, headaches, neck pain, numbness, rash, vomiting or weakness. The symptoms are aggravated by bending and twisting. He has tried ice and acetaminophen for the symptoms. The treatment provided mild relief.   Patient c/o pain and swelling to the left wrist after a fall against a wall. Pain is worse with flexion of the wrist.  He denies other injuries or LOC.  States pain is improved with an ACE wrap  Past Medical History  Diagnosis Date  . Emphysema   . Allergic rhinitis   . Prostate cancer   . Hypothyroid   . Cataract     s/p removal  . PNA (pneumonia)   . Chronic respiratory failure     oxygen 3L at home   Past Surgical History  Procedure Laterality Date  . Rotator cuff repair  [id number]    bilateral  . Transurethral resection of prostate  2001    x2  . Tonsillectomy    . Adenoidectomy    . Seed implant for prostate cancer  2000   Family History  Problem Relation Age of Onset  . Heart disease Mother   . Prostate cancer Father    History  Substance Use Topics  . Smoking status: Former Smoker -- 1.50 packs/day for 50 years    Types: Cigarettes    Quit date: 09/17/2008  . Smokeless tobacco: Never Used  . Alcohol Use: 0.6 oz/week    1 Glasses of wine per week     Comment: each evening    Review of Systems  Constitutional: Negative for fever and  chills.  Cardiovascular: Negative for chest pain.  Gastrointestinal: Negative for vomiting.  Genitourinary: Negative for dysuria and difficulty urinating.  Musculoskeletal: Positive for arthralgias and joint swelling. Negative for neck pain.  Skin: Negative for color change, rash and wound.  Neurological: Negative for dizziness, syncope, weakness, numbness and headaches.  All other systems reviewed and are negative.      Allergies  Morphine  Home Medications   Current Outpatient Rx  Name  Route  Sig  Dispense  Refill  . budesonide-formoterol (SYMBICORT) 160-4.5 MCG/ACT inhaler   Inhalation   Inhale 2 puffs into the lungs 2 (two) times daily.   1 Inhaler   6   . Calcium-Magnesium-Vitamin D (CITRACAL CALCIUM+D PO)   Oral   Take 1 tablet by mouth every morning.          Marland Kitchen ibuprofen (ADVIL,MOTRIN) 400 MG tablet   Oral   Take 400 mg by mouth every 6 (six) hours as needed for fever, headache or mild pain.          . Ipratropium-Albuterol (COMBIVENT) 20-100 MCG/ACT AERS respimat   Inhalation   Inhale 1 puff into the lungs every 6 (six) hours as needed for wheezing or shortness of breath.         . levothyroxine (SYNTHROID, LEVOTHROID) 75 MCG tablet   Oral  Take 75 mcg by mouth daily.         . Melatonin 3 MG TABS   Oral   Take 1-2 tablets by mouth at bedtime.         . Multiple Vitamin (MULTIVITAMIN WITH MINERALS) TABS tablet   Oral   Take 1 tablet by mouth every morning.         . tamsulosin (FLOMAX) 0.4 MG CAPS capsule   Oral   Take 0.4 capsules by mouth daily.         . traMADol (ULTRAM) 50 MG tablet   Oral   Take 50 mg by mouth 3 (three) times daily as needed.         . zolpidem (AMBIEN) 10 MG tablet   Oral   Take 0.5 tablets (5 mg total) by mouth at bedtime.   30 tablet   1   . docusate sodium (COLACE) 100 MG capsule   Oral   Take 100 mg by mouth 2 (two) times daily as needed for mild constipation. Stool softner         .  HYDROcodone-acetaminophen (NORCO/VICODIN) 5-325 MG per tablet      Take one half to one tab po q 4-6 hrs prn pain   15 tablet   0    BP 142/68  Pulse 101  Temp(Src) 98.4 F (36.9 C) (Oral)  Resp 20  Ht 5\' 6"  (1.676 m)  Wt 130 lb (58.968 kg)  BMI 20.99 kg/m2  SpO2 94% Physical Exam  Nursing note and vitals reviewed. Constitutional: He is oriented to person, place, and time. He appears well-developed and well-nourished. No distress.  HENT:  Head: Normocephalic and atraumatic.  Cardiovascular: Normal rate, regular rhythm and normal heart sounds.   Pulmonary/Chest: Effort normal and breath sounds normal.  Musculoskeletal: He exhibits tenderness. He exhibits no edema.  ttp of the medial and lateral aspects of the left wrist. Mild STS.   Radial pulse is brisk, distal sensation intact.  CR< 2 sec.  No bruising or bony deformity.  No proximal tenderness or edema  Neurological: He is alert and oriented to person, place, and time. He exhibits normal muscle tone. Coordination normal.  Skin: Skin is warm and dry.    ED Course  Procedures (including critical care time) Labs Review Labs Reviewed - No data to display Imaging Review Dg Wrist Complete Left  12/12/2013   CLINICAL DATA:  Wrist pain.  EXAM: LEFT WRIST - COMPLETE 3+ VIEW  COMPARISON:  None.  FINDINGS: Posterior soft tissue swelling. Calcifications in the triangular fibrocartilage. No acute bony abnormality. No fracture, subluxation or dislocation.  IMPRESSION: No acute bony abnormality.   Electronically Signed   By: Rolm Baptise M.D.   On: 12/12/2013 12:42    EKG Interpretation None      MDM   Final diagnoses:  Left wrist sprain    Patient is well appearing, VSS.  Patient is on 3L home O2 secondary to Emphysema.  Mechanical injury to left wrist.  XR neg for fx.  Has lateral and medial wrist pain with mild STS.  NV intact.     Patient discussed with Dr. Wilson Singer prior to discharge.   Wrist splint applied, pain improved,  pt agrees to RICE therapy and ortho f/u for possible ligamentous injury.      Emnet Monk L. Jashiya Bassett, PA-C 12/14/13 1745

## 2013-12-17 NOTE — ED Provider Notes (Signed)
Medical screening examination/treatment/procedure(s) were performed by non-physician practitioner and as supervising physician I was immediately available for consultation/collaboration.   EKG Interpretation None       Tashiba Timoney, MD 12/17/13 0637 

## 2013-12-23 ENCOUNTER — Telehealth: Payer: Self-pay | Admitting: *Deleted

## 2013-12-23 NOTE — Telephone Encounter (Signed)
PA approved.   09/17/2013- 09/16/2014.

## 2013-12-23 NOTE — Telephone Encounter (Signed)
Requires PA for Ambien.   PA submitted.

## 2013-12-25 ENCOUNTER — Telehealth: Payer: Self-pay | Admitting: Pulmonary Disease

## 2013-12-25 DIAGNOSIS — J439 Emphysema, unspecified: Secondary | ICD-10-CM

## 2013-12-25 NOTE — Telephone Encounter (Signed)
Spoke with pt and appt scheduled. Nothing further needed

## 2013-12-25 NOTE — Telephone Encounter (Signed)
Pt insurance is requiring him to have a PFT in order to cover pulmonary rehab. Please advise if ok to order PFT. I do not see one in the pt chart. Sibley Bing, CMA

## 2013-12-25 NOTE — Telephone Encounter (Signed)
He has had pfts 2010.  If that is not good enough for them, ok to repeat full pfts.

## 2013-12-29 ENCOUNTER — Encounter (HOSPITAL_COMMUNITY)
Admission: RE | Admit: 2013-12-29 | Discharge: 2013-12-29 | Disposition: A | Discharge status: Home / Self Care | Payer: Medicare HMO | Source: Ambulatory Visit | Attending: Pulmonary Disease | Admitting: Pulmonary Disease

## 2013-12-29 VITALS — BP 112/60 | HR 83 | Ht 65.0 in | Wt 124.4 lb

## 2013-12-29 DIAGNOSIS — J438 Other emphysema: Secondary | ICD-10-CM

## 2013-12-29 NOTE — Progress Notes (Signed)
Patient is referred to PR by Dr. Danton Sewer due to Emphysema. During orientation advised patient on arrival and appointment times what to wear, what to do before, during and after exercise. Reviewed attendance and class policy. Talked about inclement weather and class consultation policy. Pt is scheduled to start Pulm Rehab on 01/18/14 at 1 pm. Pt was advised to come to class 5 minutes before class starts. He was also given instructions on meeting with the dietician and attending the Family Structure classes. Pt is eager to get started. Patient was able to do the 6 minute walk test. Also discussed pursed lip breathing.

## 2013-12-29 NOTE — Patient Instructions (Signed)
Pt has finished orientation and is scheduled to start PR on 01/18/14 at 1 pm. Pt has been instructed to arrive to class 15 minutes early for scheduled class. Pt has been instructed to wear comfortable clothing and shoes with rubber soles. Pt has been told to take their medications 1 hour prior to coming to class.  If the patient is not going to attend class, he/she has been instructed to call.

## 2014-01-01 ENCOUNTER — Encounter: Payer: Self-pay | Admitting: Family Medicine

## 2014-01-01 ENCOUNTER — Ambulatory Visit (INDEPENDENT_AMBULATORY_CARE_PROVIDER_SITE_OTHER): Payer: Medicare HMO | Admitting: Family Medicine

## 2014-01-01 VITALS — BP 106/58 | HR 86 | Temp 98.2°F | Resp 16 | Ht 64.5 in | Wt 127.0 lb

## 2014-01-01 DIAGNOSIS — E039 Hypothyroidism, unspecified: Secondary | ICD-10-CM

## 2014-01-01 DIAGNOSIS — J438 Other emphysema: Secondary | ICD-10-CM

## 2014-01-01 DIAGNOSIS — J439 Emphysema, unspecified: Secondary | ICD-10-CM

## 2014-01-01 DIAGNOSIS — E785 Hyperlipidemia, unspecified: Secondary | ICD-10-CM

## 2014-01-01 DIAGNOSIS — G4700 Insomnia, unspecified: Secondary | ICD-10-CM

## 2014-01-01 MED ORDER — HYDROCODONE-ACETAMINOPHEN 5-325 MG PO TABS: ORAL_TABLET | ORAL | Status: DC

## 2014-01-01 NOTE — Patient Instructions (Addendum)
Continue current medications Pain meds refilled Get the labs done  Ask about the Prevnar 13 F/U 4 months

## 2014-01-03 NOTE — Progress Notes (Signed)
Patient ID: John Black, male   DOB: 1928/12/23, 80 y.o.   [name]: [id number]   Subjective:    Patient ID: John Black, male    DOB: 10/31/1928, 80 y.o.   [name]: [id number]  Patient presents for 3 motnh F/U  to follow chronic medical problems. He has no specific concerns today. He states his breathing is doing well. He no longer requires a lot of pain medication his postherpetic neuralgia. He's doing well and his medications and his sleep is doing well with the melatonin and Ambien. He was seen by urology and had tremendous, Flomax is doing well with this. He is going to start pulmonary rehabilitation soon.    Review Of Systems:  GEN- denies fatigue, fever, weight loss,weakness, recent illness HEENT- denies eye drainage, change in vision, nasal discharge, CVS- denies chest pain, palpitations RESP- denies SOB, cough, wheeze ABD- denies N/V, change in stools, abd pain GU- denies dysuria, hematuria, dribbling, incontinence MSK- denies joint pain, muscle aches, injury Neuro- denies headache, dizziness, syncope, seizure activity       Objective:    BP 106/58  Pulse 86  Temp(Src) 98.2 F (36.8 C) (Oral)  Resp 16  Ht 5' 4.5" (1.638 m)  Wt 127 lb (57.607 kg)  BMI 21.47 kg/m2 GEN- NAD, alert and oriented x3, well appearing, + 3lb weight gain HEENT- PERRL, EOMI, non injected sclera, pink conjunctiva, MMM, oropharynx clear CVS- RRR, no murmur RESP-CTAB, 2L oxygen EXT- No edema Pulses- Radial, DP- 2+        Assessment & Plan:      Problem List Items Addressed This Visit   Mild hyperlipidemia   Relevant Orders      Lipid panel      Comprehensive metabolic panel      CBC with Differential   Insomnia     sLEEP is improved with combination of the melatonin and ambien    Hypothyroidism - Primary     Thyroid function test currently on Synthroid 75 mcg    Relevant Orders      TSH      T3, Free      T4, Free   COPD (chronic obstructive pulmonary disease) with  emphysema     Doing well on current medications. He still using oxygen. He is taking his allergy medicine as prescribed as well. He will discuss Prevnar 28 with Dr. Gwenette Greet    Relevant Medications      loratadine (CLARITIN) 10 MG tablet      Note: This dictation was prepared with Dragon dictation along with smaller phrase technology. Any transcriptional errors that result from this process are unintentional.

## 2014-01-03 NOTE — Assessment & Plan Note (Signed)
Thyroid function test currently on Synthroid 75 mcg

## 2014-01-03 NOTE — Assessment & Plan Note (Addendum)
Doing well on current medications. He still using oxygen. He is taking his allergy medicine as prescribed as well. He will discuss Prevnar 9 with Dr. Gwenette Greet

## 2014-01-03 NOTE — Assessment & Plan Note (Signed)
sLEEP is improved with combination of the melatonin and [place]

## 2014-01-06 LAB — CBC WITH DIFFERENTIAL/PLATELET
Basophils Absolute: 0 10*3/uL (ref 0.0–0.1)
Basophils Relative: 0 % (ref 0–1)
Eosinophils Absolute: 0.5 10*3/uL (ref 0.0–0.7)
Eosinophils Relative: 7 % — ABNORMAL HIGH (ref 0–5)
HCT: 40.6 % (ref 39.0–52.0)
Hemoglobin: 14 g/dL (ref 13.0–17.0)
Lymphocytes Relative: 37 % (ref 12–46)
Lymphs Abs: 2.5 10*3/uL (ref 0.7–4.0)
MCH: 27.5 pg (ref 26.0–34.0)
MCHC: 34.5 g/dL (ref 30.0–36.0)
MCV: 79.8 fL (ref 78.0–100.0)
Monocytes Absolute: 0.7 10*3/uL (ref 0.1–1.0)
Monocytes Relative: 11 % (ref 3–12)
Neutro Abs: 3.1 10*3/uL (ref 1.7–7.7)
Neutrophils Relative %: 45 % (ref 43–77)
Platelets: 320 10*3/uL (ref 150–400)
RBC: 5.09 MIL/uL (ref 4.22–5.81)
RDW: 14.9 % (ref 11.5–15.5)
WBC: 6.8 10*3/uL (ref 4.0–10.5)

## 2014-01-06 LAB — LIPID PANEL
Cholesterol: 222 mg/dL — ABNORMAL HIGH (ref 0–200)
HDL: 66 mg/dL (ref >39)
LDL Cholesterol: 141 mg/dL — ABNORMAL HIGH (ref 0–99)
Total CHOL/HDL Ratio: 3.4 Ratio
Triglycerides: 75 mg/dL (ref <150)
VLDL: 15 mg/dL (ref 0–40)

## 2014-01-06 LAB — COMPREHENSIVE METABOLIC PANEL
ALT: 19 U/L (ref 0–53)
AST: 23 U/L (ref 0–37)
Albumin: 4.3 g/dL (ref 3.5–5.2)
Alkaline Phosphatase: 62 U/L (ref 39–117)
BUN: 18 mg/dL (ref 6–23)
CO2: 28 mEq/L (ref 19–32)
Calcium: 9.6 mg/dL (ref 8.4–10.5)
Chloride: 103 mEq/L (ref 96–112)
Creat: 0.87 mg/dL (ref 0.50–1.35)
Glucose, Bld: 90 mg/dL (ref 70–99)
Potassium: 4.7 mEq/L (ref 3.5–5.3)
Sodium: 140 mEq/L (ref 135–145)
Total Bilirubin: 0.6 mg/dL (ref 0.2–1.2)
Total Protein: 6.7 g/dL (ref 6.0–8.3)

## 2014-01-06 LAB — T4, FREE: Free T4: 1.26 ng/dL (ref 0.80–1.80)

## 2014-01-06 LAB — T3, FREE: T3, Free: 3.2 pg/mL (ref 2.3–4.2)

## 2014-01-06 LAB — TSH: TSH: 2.293 u[IU]/mL (ref 0.350–4.500)

## 2014-01-07 ENCOUNTER — Ambulatory Visit (INDEPENDENT_AMBULATORY_CARE_PROVIDER_SITE_OTHER): Payer: Medicare HMO | Admitting: Pulmonary Disease

## 2014-01-07 DIAGNOSIS — J439 Emphysema, unspecified: Secondary | ICD-10-CM

## 2014-01-07 DIAGNOSIS — J438 Other emphysema: Secondary | ICD-10-CM

## 2014-01-07 NOTE — Progress Notes (Signed)
PFT done today. 

## 2014-01-12 LAB — PULMONARY FUNCTION TEST
DL/VA % pred: 52 %
DL/VA: 2.19 ml/min/mmHg/L
DLCO unc % pred: 47 %
DLCO unc: 11.56 ml/min/mmHg
FEF 25-75 Post: 0.5 L/sec
FEF 25-75 Pre: 0.47 L/sec
FEF2575-%Change-Post: 7 %
FEF2575-%Pred-Post: 37 %
FEF2575-%Pred-Pre: 35 %
FEV1-%Change-Post: 6 %
FEV1-%Pred-Post: 57 %
FEV1-%Pred-Pre: 53 %
FEV1-Post: 1.17 L
FEV1-Pre: 1.1 L
FEV1FVC-%Change-Post: 6 %
FEV1FVC-%Pred-Pre: 48 %
FEV6-%Change-Post: 1 %
FEV6-%Pred-Post: 111 %
FEV6-%Pred-Pre: 109 %
FEV6-Post: 3.04 L
FEV6-Pre: 2.99 L
FEV6FVC-%Change-Post: 1 %
FEV6FVC-%Pred-Post: 102 %
FEV6FVC-%Pred-Pre: 101 %
FVC-%Change-Post: 0 %
FVC-%Pred-Post: 107 %
FVC-%Pred-Pre: 107 %
FVC-Post: 3.22 L
FVC-Pre: 3.22 L
Post FEV1/FVC ratio: 36 %
Post FEV6/FVC ratio: 94 %
Pre FEV1/FVC ratio: 34 %
Pre FEV6/FVC Ratio: 93 %
RV % pred: 143 %
RV: 3.45 L
TLC % pred: 120 %
TLC: 7.15 L

## 2014-01-18 ENCOUNTER — Encounter (HOSPITAL_COMMUNITY)
Admission: RE | Admit: 2014-01-18 | Discharge: 2014-01-18 | Disposition: A | Discharge status: Home / Self Care | Payer: Medicare HMO | Source: Ambulatory Visit | Attending: Pulmonary Disease | Admitting: Pulmonary Disease

## 2014-01-18 ENCOUNTER — Telehealth: Payer: Self-pay | Admitting: *Deleted

## 2014-01-18 DIAGNOSIS — Z5189 Encounter for other specified aftercare: Secondary | ICD-10-CM | POA: Insufficient documentation

## 2014-01-18 DIAGNOSIS — J438 Other emphysema: Secondary | ICD-10-CM | POA: Insufficient documentation

## 2014-01-18 NOTE — Telephone Encounter (Signed)
Pharmacy made aware

## 2014-01-18 NOTE — Telephone Encounter (Signed)
He was placed on flomax by urology, I would not recommend switching back until he speaks to urology

## 2014-01-18 NOTE — Telephone Encounter (Signed)
Received refill request for Doxazosin 8mg .   Medication not noted on active medication list, but patient has been on in past.   MD please advise.

## 2014-01-20 ENCOUNTER — Encounter (HOSPITAL_COMMUNITY)
Admission: RE | Admit: 2014-01-20 | Discharge: 2014-01-20 | Disposition: A | Discharge status: Home / Self Care | Payer: Medicare HMO | Source: Ambulatory Visit | Attending: Pulmonary Disease | Admitting: Pulmonary Disease

## 2014-01-25 ENCOUNTER — Encounter (HOSPITAL_COMMUNITY)
Admission: RE | Admit: 2014-01-25 | Discharge: 2014-01-25 | Disposition: A | Discharge status: Home / Self Care | Payer: Medicare HMO | Source: Ambulatory Visit | Attending: Pulmonary Disease | Admitting: Pulmonary Disease

## 2014-01-27 ENCOUNTER — Encounter (HOSPITAL_COMMUNITY)
Admission: RE | Admit: 2014-01-27 | Discharge: 2014-01-27 | Disposition: A | Discharge status: Home / Self Care | Payer: Medicare HMO | Source: Ambulatory Visit | Attending: Pulmonary Disease | Admitting: Pulmonary Disease

## 2014-01-27 ENCOUNTER — Ambulatory Visit: Payer: Medicare HMO | Admitting: Podiatry

## 2014-02-01 ENCOUNTER — Encounter (HOSPITAL_COMMUNITY)
Admission: RE | Admit: 2014-02-01 | Discharge: 2014-02-01 | Disposition: A | Discharge status: Home / Self Care | Payer: Medicare HMO | Source: Ambulatory Visit | Attending: Pulmonary Disease | Admitting: Pulmonary Disease

## 2014-02-03 ENCOUNTER — Encounter (HOSPITAL_COMMUNITY)
Admission: RE | Admit: 2014-02-03 | Discharge: 2014-02-03 | Disposition: A | Discharge status: Home / Self Care | Payer: Medicare HMO | Source: Ambulatory Visit | Attending: Pulmonary Disease | Admitting: Pulmonary Disease

## 2014-02-08 ENCOUNTER — Encounter (HOSPITAL_COMMUNITY): Payer: Medicare HMO

## 2014-02-10 ENCOUNTER — Encounter (HOSPITAL_COMMUNITY): Payer: Medicare HMO

## 2014-02-15 ENCOUNTER — Encounter (HOSPITAL_COMMUNITY)
Admission: RE | Admit: 2014-02-15 | Discharge: 2014-02-15 | Disposition: A | Discharge status: Home / Self Care | Payer: Medicare HMO | Source: Ambulatory Visit | Attending: Pulmonary Disease | Admitting: Pulmonary Disease

## 2014-02-15 DIAGNOSIS — Z5189 Encounter for other specified aftercare: Secondary | ICD-10-CM | POA: Insufficient documentation

## 2014-02-15 DIAGNOSIS — J438 Other emphysema: Secondary | ICD-10-CM | POA: Insufficient documentation

## 2014-02-17 ENCOUNTER — Encounter (HOSPITAL_COMMUNITY)
Admission: RE | Admit: 2014-02-17 | Discharge: 2014-02-17 | Disposition: A | Discharge status: Home / Self Care | Payer: Medicare HMO | Source: Ambulatory Visit | Attending: Pulmonary Disease | Admitting: Pulmonary Disease

## 2014-02-22 ENCOUNTER — Telehealth: Payer: Self-pay | Admitting: *Deleted

## 2014-02-22 ENCOUNTER — Encounter (HOSPITAL_COMMUNITY)
Admission: RE | Admit: 2014-02-22 | Discharge: 2014-02-22 | Disposition: A | Discharge status: Home / Self Care | Payer: Medicare HMO | Source: Ambulatory Visit | Attending: Pulmonary Disease | Admitting: Pulmonary Disease

## 2014-02-22 MED ORDER — ZOLPIDEM TARTRATE 10 MG PO TABS: 5.0000 mg | ORAL_TABLET | Freq: Every day | ORAL | Status: DC

## 2014-02-22 NOTE — Telephone Encounter (Signed)
Medication called to pharmacy. 

## 2014-02-22 NOTE — Telephone Encounter (Signed)
Okay to refill? 

## 2014-02-22 NOTE — Telephone Encounter (Signed)
Ok to refill Ambien??  Last office visit 01/01/2014.  Last refill 11/10/2013.

## 2014-02-24 ENCOUNTER — Encounter (HOSPITAL_COMMUNITY)
Admission: RE | Admit: 2014-02-24 | Discharge: 2014-02-24 | Disposition: A | Discharge status: Home / Self Care | Payer: Medicare HMO | Source: Ambulatory Visit | Attending: Pulmonary Disease | Admitting: Pulmonary Disease

## 2014-02-25 ENCOUNTER — Other Ambulatory Visit: Payer: Self-pay | Admitting: Family Medicine

## 2014-02-25 NOTE — Telephone Encounter (Signed)
Last OV 01/01/14.  ?? Last RF.

## 2014-02-26 NOTE — Telephone Encounter (Signed)
RX called in .

## 2014-03-01 ENCOUNTER — Encounter (HOSPITAL_COMMUNITY)
Admission: RE | Admit: 2014-03-01 | Discharge: 2014-03-01 | Disposition: A | Discharge status: Home / Self Care | Payer: Medicare HMO | Source: Ambulatory Visit | Attending: Pulmonary Disease | Admitting: Pulmonary Disease

## 2014-03-02 ENCOUNTER — Ambulatory Visit (INDEPENDENT_AMBULATORY_CARE_PROVIDER_SITE_OTHER): Payer: Medicare HMO | Admitting: Family Medicine

## 2014-03-02 ENCOUNTER — Encounter: Payer: Self-pay | Admitting: Family Medicine

## 2014-03-02 VITALS — BP 122/76 | HR 84 | Temp 98.1°F | Resp 16 | Ht 65.0 in | Wt 125.0 lb

## 2014-03-02 DIAGNOSIS — K4090 Unilateral inguinal hernia, without obstruction or gangrene, not specified as recurrent: Secondary | ICD-10-CM

## 2014-03-02 DIAGNOSIS — K5900 Constipation, unspecified: Secondary | ICD-10-CM

## 2014-03-02 MED ORDER — POLYETHYLENE GLYCOL 3350 17 GM/SCOOP PO POWD
17.0000 g | Freq: Every day | ORAL | Status: DC
Start: 2014-03-02 — End: 2014-08-20

## 2014-03-02 NOTE — Assessment & Plan Note (Signed)
Inguinal hernia noted on exam. We discussed surgical intervention he wishes to hold off at this time and is moderate. He needs to watch what he is doing so that he does not do any activities that cause a Valsalva maneuver which did make hernia worse

## 2014-03-02 NOTE — Assessment & Plan Note (Signed)
The MiraLAX does do well for him however he just does not like to have looser bowels. I advised him to take just a little bit under the recommended dose to try to take this daily to prevent straining that he's been doing worsening of the hernia. He can also continue his stool softener

## 2014-03-02 NOTE — Progress Notes (Signed)
Patient ID: John Black, male   DOB: [birth year], 80 y.o.   [name]: [id number]   Subjective:    Patient ID: John Black, male    DOB: 08/04/1929, 80 y.o.   [name]: [id number]  Patient presents for Swollen area to lower abdomen on R side  patient here with swollen area to his right groin. He states it has been there for about a month. He's been straining with his bowel movements and is when he initially noticed it. He does take MiraLAX some days he states when he took a regular basis his bowels were on the looser and therefore he cut back to just 3-4 times a week he is also take a stool softener. He has minimal pain from the swollen area he states initially he was able to push it back in with the keys but it is getting a little bit larger.  Denies any difficulties with his breathing otherwise feels very well he is still in pulmonary rehabilitation    Review Of Systems:  GEN- denies fatigue, fever, weight loss,weakness, recent illness HEENT- denies eye drainage, change in vision, nasal discharge, CVS- denies chest pain, palpitations RESP- denies SOB, cough, wheeze ABD- denies N/V, change in stools, abd pain GU- denies dysuria, hematuria, dribbling, incontinence MSK- denies joint pain, muscle aches, injury Neuro- denies headache, dizziness, syncope, seizure activity       Objective:    BP 122/76  Pulse 84  Temp(Src) 98.1 F (36.7 C) (Oral)  Resp 16  Ht 5\' 5"  (1.651 m)  Wt 125 lb (56.7 kg)  BMI 20.80 kg/m2 GEN- NAD, alert and oriented x3 CVS- RRR, no murmur RESP-few scattered wheeze, good air movement ABD-NABS,soft,NT,ND GU- Right inguinal hernia, easily reduced, non tender to palpation EXT- No edema Pulses- Radial 2+        Assessment & Plan:      Problem List Items Addressed This Visit   Inguinal hernia - Primary      Note: This dictation was prepared with Dragon dictation along with smaller phrase technology. Any transcriptional errors that result from this  process are unintentional.

## 2014-03-02 NOTE — Patient Instructions (Signed)
Call if the hernia gets worse Continue medications Miralax once a day  F/U as previous

## 2014-03-03 ENCOUNTER — Encounter (HOSPITAL_COMMUNITY)
Admission: RE | Admit: 2014-03-03 | Discharge: 2014-03-03 | Disposition: A | Discharge status: Home / Self Care | Payer: Medicare HMO | Source: Ambulatory Visit | Attending: Pulmonary Disease | Admitting: Pulmonary Disease

## 2014-03-08 ENCOUNTER — Encounter (HOSPITAL_COMMUNITY)
Admission: RE | Admit: 2014-03-08 | Discharge: 2014-03-08 | Disposition: A | Discharge status: Home / Self Care | Payer: Medicare HMO | Source: Ambulatory Visit | Attending: Pulmonary Disease | Admitting: Pulmonary Disease

## 2014-03-10 ENCOUNTER — Encounter (HOSPITAL_COMMUNITY)
Admission: RE | Admit: 2014-03-10 | Discharge: 2014-03-10 | Disposition: A | Discharge status: Home / Self Care | Payer: Medicare HMO | Source: Ambulatory Visit | Attending: Pulmonary Disease | Admitting: Pulmonary Disease

## 2014-03-15 ENCOUNTER — Encounter (HOSPITAL_COMMUNITY)
Admission: RE | Admit: 2014-03-15 | Discharge: 2014-03-15 | Disposition: A | Discharge status: Home / Self Care | Payer: Medicare HMO | Source: Ambulatory Visit | Attending: Pulmonary Disease | Admitting: Pulmonary Disease

## 2014-03-15 ENCOUNTER — Telehealth: Payer: Self-pay | Admitting: *Deleted

## 2014-03-15 NOTE — Telephone Encounter (Signed)
Contacted pt to let him know that our records show he is due for PNA shot and asked him if he wanted to come in as nurse visit to have it done or wait until his next appointment, pt states he comes in August for follow up and will take it at that time.

## 2014-03-16 NOTE — Addendum Note (Signed)
Encounter addended by: Norlene Duel, RN on: 03/16/2014 10:50 AM<BR>     Documentation filed: Notes Section

## 2014-03-16 NOTE — Progress Notes (Signed)
Pulmonary Rehabilitation Program Outcomes Report   Orientation:  12/29/2013 1st week report: 01/25/2014 Graduate Date:  tbd Discharge Date:  tbd # of sessions completed: 3 DX: Emphysema  Pulmonologist: Clance Family MD:  [facility] Class Time:  13:00  A.  Exercise Program:  Tolerates exercise @ 3.66 METS for 15 minutes and Walk Test Results:  Pre: Pre walk test: Rest HR 83, BP 112/60, O2 92% RPE 7 and RPD 9, 60minute HR 117, BP122/72, O2 93%, RPE 9 and RPD 11, POst HR 85, BP 110/64, O2 95% RPE 7, and RPD 9, walked 1150feet at 2.1 mph at 2.6 METS.,  B.  Mental Health:  Good mental attitude  C.  Education/Instruction/Skills  Accurately checks own pulse.  Rest:  66  Exercise: 111, Knows THR for exercise and Uses Perceived Exertion Scale and/or Dyspnea Scale  Uses Perceived Exertion Scale and/or Dyspnea Scale  D.  Nutrition/Weight Control/Body Composition:  Adherence to prescribed nutrition program: good    E.  Blood Lipids    Lab Results  Component Value Date   CHOL 222* 01/06/2014   HDL 66 01/06/2014   LDLCALC 141* 01/06/2014   TRIG 75 01/06/2014   CHOLHDL 3.4 01/06/2014    F.  Lifestyle Changes:  Making positive lifestyle changes and Not smoking:  Quit 2010  G.  Symptoms noted with exercise:  Asymptomatic  Report Completed By:  Oletta Lamas. Elin Fenley RN   Comments:  This is patients 1st week report. He has done well his first week. He achieved a peak METS of 3.66. His resting HR was 66 and resting BP was 120/70, His peak HR was 111 and peak BP was 130/70. A halfway report will follow upon his 18th visit.

## 2014-03-16 NOTE — Addendum Note (Signed)
Encounter addended by: Norlene Duel, RN on: 03/16/2014 10:51 AM<BR>     Documentation filed: Clinical Notes

## 2014-03-16 NOTE — Progress Notes (Signed)
Pulmonary Rehabilitation Program Outcomes Report   Orientation:   12/29/2013 Halfway report: 03/03/2014 Graduate Date:  tbd Discharge Date:  tbd # of sessions completed: 12 DX: Emphyseama  Pulmonologist: Clance Family MD:  Buelah Manis Class Time:  13:00  A.  Exercise Program:  Tolerates exercise @ 3.67 METS for 15 minutes  B.  Mental Health:  Good mental attitude  C.  Education/Instruction/Skills  Accurately checks own pulse.  Rest:  87  Exercise: 116, Knows THR for exercise and Uses Perceived Exertion Scale and/or Dyspnea Scale  Uses Perceived Exertion Scale and/or Dyspnea Scale  D.  Nutrition/Weight Control/Body Composition:  Adherence to prescribed nutrition program: good    E.  Blood Lipids    Lab Results  Component Value Date   CHOL 222* 01/06/2014   HDL 66 01/06/2014   LDLCALC 141* 01/06/2014   TRIG 75 01/06/2014   CHOLHDL 3.4 01/06/2014    F.  Lifestyle Changes:  Making positive lifestyle changes and Not smoking:  Quit 2010  G.  Symptoms noted with exercise:  Asymptomatic  Report Completed By:  Oletta Lamas. Bast RN   Comments:  This is patients halfway report. He has done well . He achieved a peak METS of 3.67. His resting HR was 87 and resting BP was 110/74 and peak HR was 116 and his peak BP was 132/76. A graduation report will follow upon his  48 th session

## 2014-03-17 ENCOUNTER — Encounter (HOSPITAL_COMMUNITY)
Admission: RE | Admit: 2014-03-17 | Discharge: 2014-03-17 | Disposition: A | Discharge status: Home / Self Care | Payer: Medicare HMO | Source: Ambulatory Visit | Attending: Pulmonary Disease | Admitting: Pulmonary Disease

## 2014-03-17 DIAGNOSIS — J438 Other emphysema: Secondary | ICD-10-CM | POA: Diagnosis not present

## 2014-03-17 DIAGNOSIS — Z5189 Encounter for other specified aftercare: Secondary | ICD-10-CM | POA: Insufficient documentation

## 2014-03-22 ENCOUNTER — Encounter (HOSPITAL_COMMUNITY)
Admission: RE | Admit: 2014-03-22 | Discharge: 2014-03-22 | Disposition: A | Discharge status: Home / Self Care | Payer: Medicare HMO | Source: Ambulatory Visit | Attending: Pulmonary Disease | Admitting: Pulmonary Disease

## 2014-03-22 DIAGNOSIS — Z5189 Encounter for other specified aftercare: Secondary | ICD-10-CM | POA: Diagnosis not present

## 2014-03-24 ENCOUNTER — Encounter (HOSPITAL_COMMUNITY)
Admission: RE | Admit: 2014-03-24 | Discharge: 2014-03-24 | Disposition: A | Discharge status: Home / Self Care | Payer: Medicare HMO | Source: Ambulatory Visit | Attending: Pulmonary Disease | Admitting: Pulmonary Disease

## 2014-03-24 DIAGNOSIS — Z5189 Encounter for other specified aftercare: Secondary | ICD-10-CM | POA: Diagnosis not present

## 2014-03-29 ENCOUNTER — Encounter (HOSPITAL_COMMUNITY)
Admission: RE | Admit: 2014-03-29 | Discharge: 2014-03-29 | Disposition: A | Discharge status: Home / Self Care | Payer: Medicare HMO | Source: Ambulatory Visit | Attending: Pulmonary Disease | Admitting: Pulmonary Disease

## 2014-03-29 DIAGNOSIS — Z5189 Encounter for other specified aftercare: Secondary | ICD-10-CM | POA: Diagnosis not present

## 2014-03-31 ENCOUNTER — Encounter (HOSPITAL_COMMUNITY)
Admission: RE | Admit: 2014-03-31 | Discharge: 2014-03-31 | Disposition: A | Discharge status: Home / Self Care | Payer: Medicare HMO | Source: Ambulatory Visit | Attending: Pulmonary Disease | Admitting: Pulmonary Disease

## 2014-03-31 DIAGNOSIS — Z5189 Encounter for other specified aftercare: Secondary | ICD-10-CM | POA: Diagnosis not present

## 2014-04-05 ENCOUNTER — Encounter (HOSPITAL_COMMUNITY)
Admission: RE | Admit: 2014-04-05 | Discharge: 2014-04-05 | Disposition: A | Discharge status: Home / Self Care | Payer: Medicare HMO | Source: Ambulatory Visit | Attending: Pulmonary Disease | Admitting: Pulmonary Disease

## 2014-04-05 DIAGNOSIS — Z5189 Encounter for other specified aftercare: Secondary | ICD-10-CM | POA: Diagnosis not present

## 2014-04-07 ENCOUNTER — Encounter (HOSPITAL_COMMUNITY)
Admission: RE | Admit: 2014-04-07 | Discharge: 2014-04-07 | Disposition: A | Discharge status: Home / Self Care | Payer: Medicare HMO | Source: Ambulatory Visit | Attending: Pulmonary Disease | Admitting: Pulmonary Disease

## 2014-04-07 DIAGNOSIS — Z5189 Encounter for other specified aftercare: Secondary | ICD-10-CM | POA: Diagnosis not present

## 2014-04-12 ENCOUNTER — Encounter (HOSPITAL_COMMUNITY)
Admission: RE | Admit: 2014-04-12 | Discharge: 2014-04-12 | Disposition: A | Discharge status: Home / Self Care | Payer: Medicare HMO | Source: Ambulatory Visit | Attending: Pulmonary Disease | Admitting: Pulmonary Disease

## 2014-04-12 ENCOUNTER — Ambulatory Visit: Payer: Medicare HMO | Admitting: Pulmonary Disease

## 2014-04-12 DIAGNOSIS — Z5189 Encounter for other specified aftercare: Secondary | ICD-10-CM | POA: Diagnosis not present

## 2014-04-14 ENCOUNTER — Encounter (HOSPITAL_COMMUNITY)
Admission: RE | Admit: 2014-04-14 | Discharge: 2014-04-14 | Disposition: A | Discharge status: Home / Self Care | Payer: Medicare HMO | Source: Ambulatory Visit | Attending: Pulmonary Disease | Admitting: Pulmonary Disease

## 2014-04-14 DIAGNOSIS — Z5189 Encounter for other specified aftercare: Secondary | ICD-10-CM | POA: Diagnosis not present

## 2014-04-19 ENCOUNTER — Encounter (HOSPITAL_COMMUNITY): Payer: Medicare HMO

## 2014-04-20 ENCOUNTER — Ambulatory Visit (INDEPENDENT_AMBULATORY_CARE_PROVIDER_SITE_OTHER): Payer: Medicare HMO | Admitting: Pulmonary Disease

## 2014-04-20 ENCOUNTER — Ambulatory Visit (INDEPENDENT_AMBULATORY_CARE_PROVIDER_SITE_OTHER)
Admission: RE | Admit: 2014-04-20 | Discharge: 2014-04-20 | Disposition: A | Discharge status: Home / Self Care | Payer: Medicare HMO | Source: Ambulatory Visit | Attending: Pulmonary Disease | Admitting: Pulmonary Disease

## 2014-04-20 ENCOUNTER — Encounter: Payer: Self-pay | Admitting: Pulmonary Disease

## 2014-04-20 VITALS — BP 108/78 | HR 84 | Temp 97.8°F | Ht 66.0 in | Wt 127.0 lb

## 2014-04-20 DIAGNOSIS — J438 Other emphysema: Secondary | ICD-10-CM

## 2014-04-20 DIAGNOSIS — J439 Emphysema, unspecified: Secondary | ICD-10-CM

## 2014-04-20 IMAGING — CR DG CHEST 2V
2 series · 2 of 2 positions shown · non-contrast
Comparison: Chest radiograph [DATE]

CLINICAL DATA: Pulmonary emphysema. Follow-up prior chest
radiograph.

EXAM:
CHEST  2 VIEW

[view not recorded (1 of 2)]
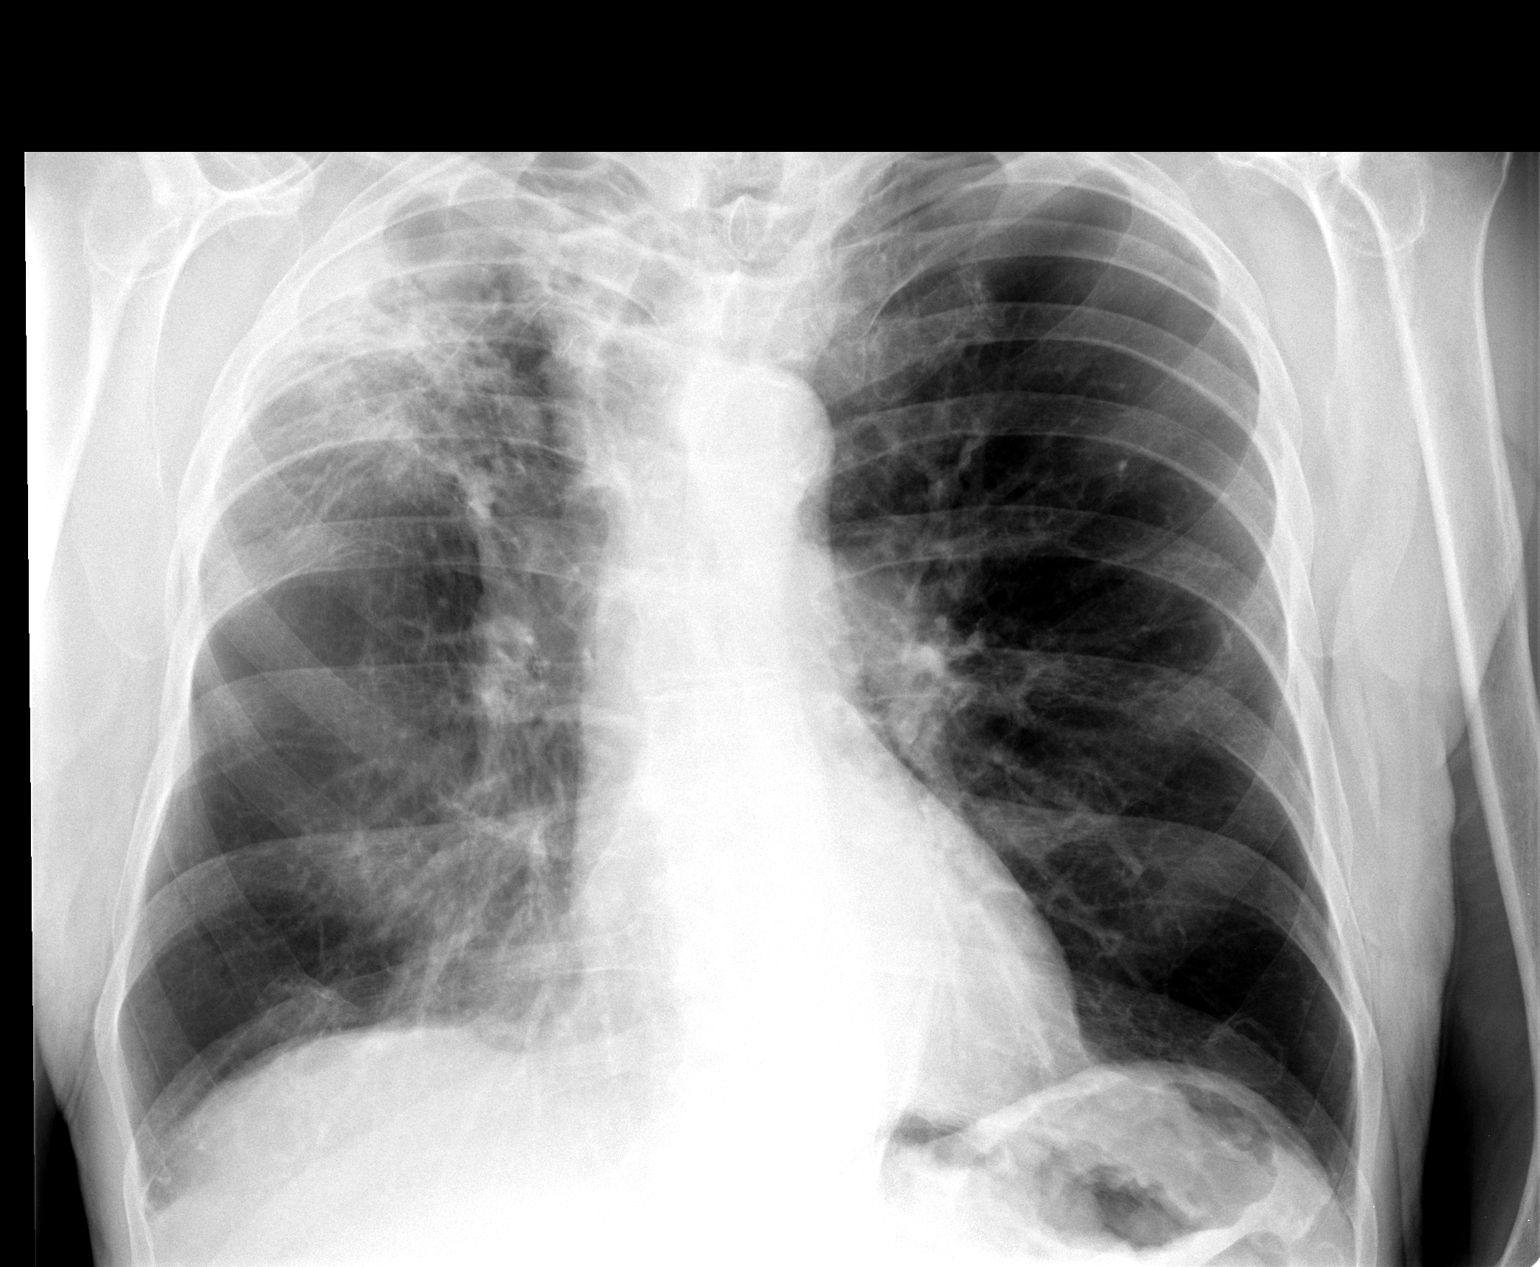

[view not recorded (2 of 2)]
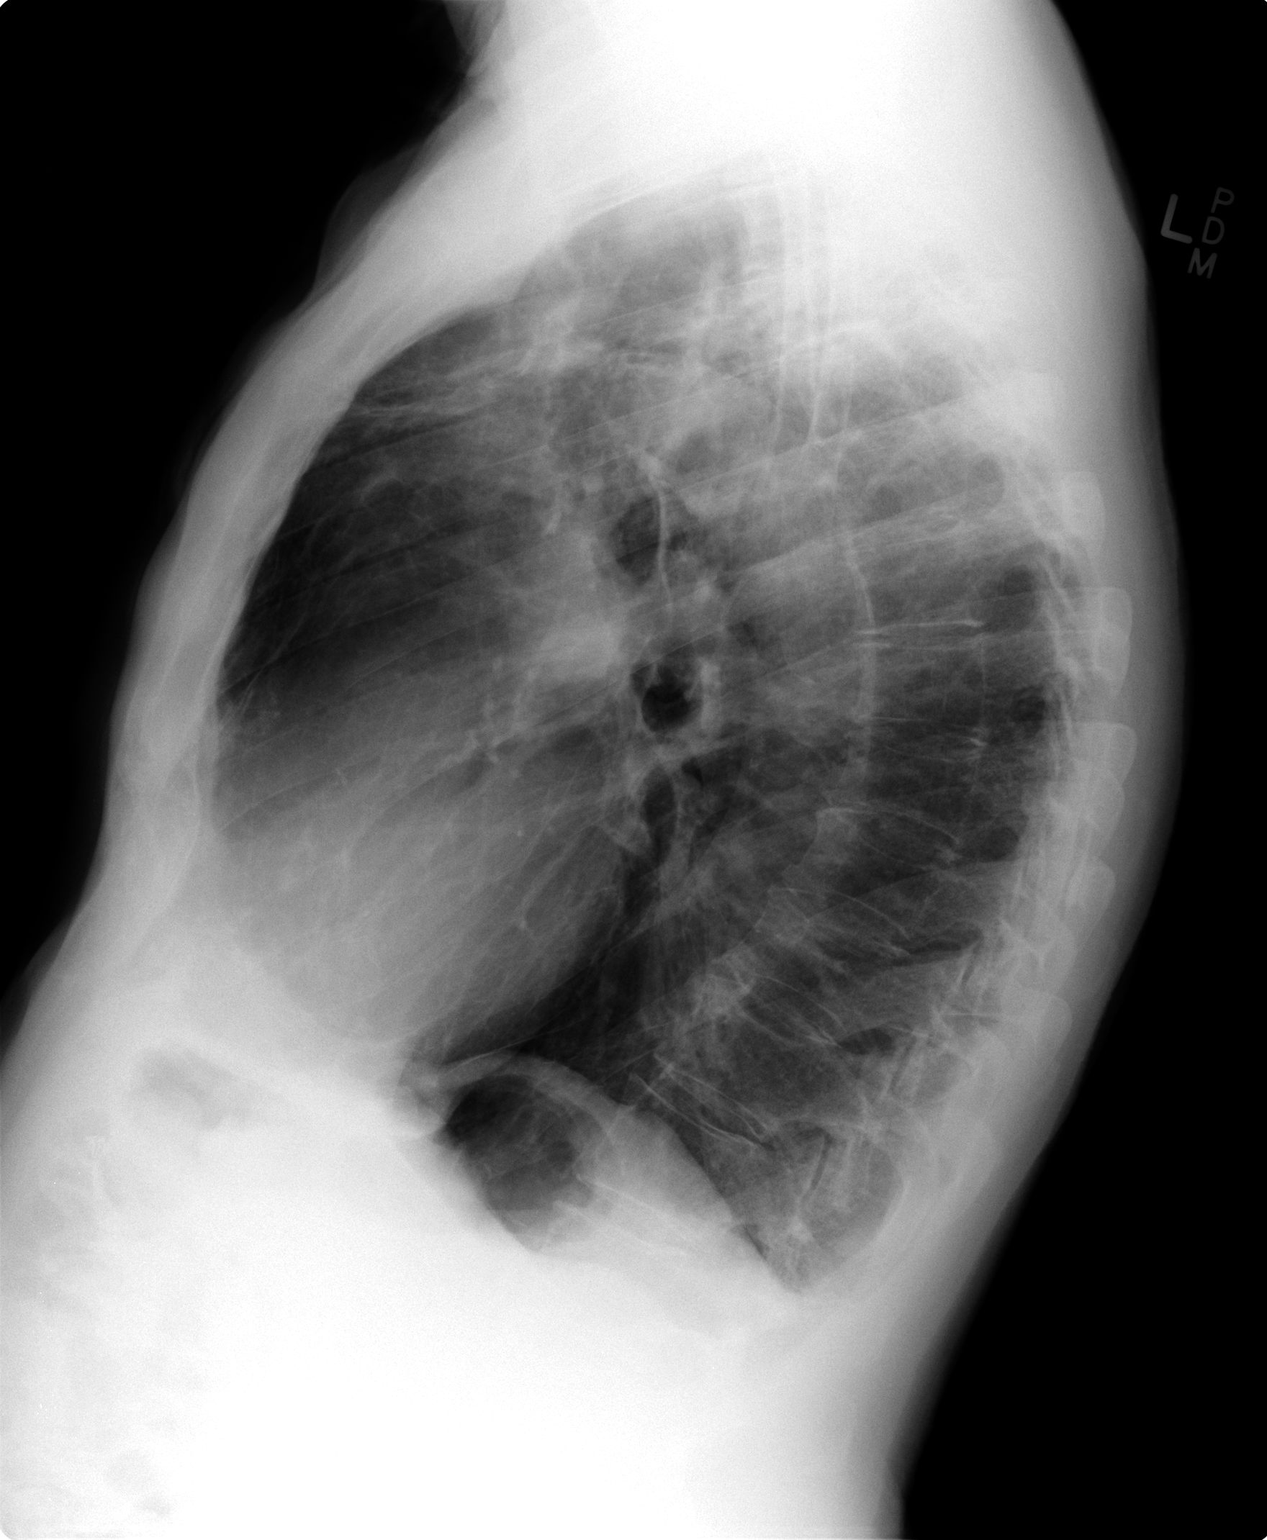

[2 of 2 positions shown; findings below may reference images not displayed]

FINDINGS: Stable cardiac and mediastinal contours. Interval removal right
upper extremity PICC line. Stable to slight interval improvement in
heterogeneous opacification of the right upper lung with associated
retraction. No pleural effusion or pneumothorax. Regional skeleton
is unremarkable.
IMPRESSION: Stable slight interval improvement irregular consolidation and
retraction of the right upper hemi thorax.

## 2014-04-20 NOTE — Assessment & Plan Note (Signed)
The patient is doing very well from a COPD standpoint, and is continuing and an exercise program. He feels that his breathing is at baseline on his current bronchodilator regimen. I will check a chest x-ray today given his recent necrotizing pneumonia involving most of the right lung. It appears that he has finally gotten over all of this.

## 2014-04-20 NOTE — Patient Instructions (Signed)
Will check a chest xray today and call you with results. No change in medications, and stay in an exercise program followup with me again in 28mos.

## 2014-04-20 NOTE — Progress Notes (Signed)
   Subjective:    Patient ID: John Black, male    DOB: Oct 09, 1928, 80 y.o.   [name]: [id number]  HPI The patient comes in today for followup of his severe COPD with chronic respiratory failure. He has done very well since the last visit, and has completed the pulmonary rehabilitation program. He is continuing his exercise program at the [facility]. He feels that his breathing has been stable since the last visit, and has not had any chest infection or acute exacerbation.   Review of Systems  Constitutional: Negative for fever and unexpected weight change.  HENT: Negative for congestion, dental problem, ear pain, nosebleeds, postnasal drip, rhinorrhea, sinus pressure, sneezing, sore throat and trouble swallowing.   Eyes: Negative for redness and itching.  Respiratory: Positive for shortness of breath. Negative for cough, chest tightness and wheezing.   Cardiovascular: Negative for palpitations and leg swelling.  Gastrointestinal: Negative for nausea and vomiting.  Genitourinary: Negative for dysuria.  Musculoskeletal: Negative for joint swelling.  Skin: Negative for rash.  Neurological: Negative for headaches.  Hematological: Does not bruise/bleed easily.  Psychiatric/Behavioral: Negative for dysphoric mood. The patient is not nervous/anxious.        Objective:   Physical Exam Well-developed male in no acute distress Nose without purulence or discharge noted Neck without lymphadenopathy or thyromegaly Chest with decreased breath sounds, a few rhonchi, no wheezing Cardiac exam with regular rate and rhythm Lower extremities without edema, no cyanosis Alert and oriented, moves all 4 extremities.       Assessment & Plan:

## 2014-04-21 ENCOUNTER — Encounter (HOSPITAL_COMMUNITY): Payer: Medicare HMO

## 2014-04-22 ENCOUNTER — Telehealth: Payer: Self-pay | Admitting: Pulmonary Disease

## 2014-04-22 NOTE — Telephone Encounter (Signed)
Notes Recorded by Kathee Delton, MD on 04/20/2014 at 5:21 PM Please let pt know that his cxr is definitely improved from the last check      Spoke with pt and notified of results per Dr. Gwenette Greet. Pt verbalized understanding and denied any questions.

## 2014-04-22 NOTE — Progress Notes (Signed)
Quick Note:  Spoke with pt and notified of results per Dr. Clance. Pt verbalized understanding and denied any questions.  ______ 

## 2014-04-26 ENCOUNTER — Encounter (HOSPITAL_COMMUNITY): Payer: Medicare HMO

## 2014-04-28 ENCOUNTER — Encounter (HOSPITAL_COMMUNITY): Payer: Medicare HMO

## 2014-05-03 ENCOUNTER — Encounter (HOSPITAL_COMMUNITY): Payer: Medicare HMO

## 2014-05-04 ENCOUNTER — Ambulatory Visit (INDEPENDENT_AMBULATORY_CARE_PROVIDER_SITE_OTHER): Payer: Medicare HMO | Admitting: Family Medicine

## 2014-05-04 VITALS — BP 106/68 | HR 90 | Temp 99.2°F | Resp 16 | Ht 65.0 in | Wt 127.0 lb

## 2014-05-04 DIAGNOSIS — C61 Malignant neoplasm of prostate: Secondary | ICD-10-CM

## 2014-05-04 DIAGNOSIS — J439 Emphysema, unspecified: Secondary | ICD-10-CM

## 2014-05-04 DIAGNOSIS — J438 Other emphysema: Secondary | ICD-10-CM

## 2014-05-04 DIAGNOSIS — G4700 Insomnia, unspecified: Secondary | ICD-10-CM

## 2014-05-04 MED ORDER — LEVOTHYROXINE SODIUM 75 MCG PO TABS: 75.0000 ug | ORAL_TABLET | Freq: Every day | ORAL | Status: DC

## 2014-05-04 NOTE — Progress Notes (Signed)
Patient ID: John Black, male   DOB: [birth year], 80 y.o.   [name]: [id number]   Subjective:    Patient ID: John Black, male    DOB: Jul 01, 1929, 80 y.o.   [name]: [id number]  Patient presents for 4 month F/U  patient here to follow chronic medical problems. She's no specific concerns today. His temperature was slightly elevated today in the office. He's not had any change in his cough or shortness of breath no other infectious symptoms. He was seen by pulmonary recently his chest x-ray actually showed interval improvement in the previous lung abscess that he had. He is also off of his allergy pills and doing well. He completed pulmonary rehabilitation and is now back at the [facility]. He does use his oxygen as needed during the day but he wears it every night.  He is currently being followed by urology annually he is on Flomax for his BPH no evidence of any recurrent prostate cancer.  Constipation he continues to use stool softeners and MiraLAX which does well for him.  Chronic insomnia he's doing well with a half a tablet of Ambien and melatonin.    Review Of Systems:  GEN- denies fatigue, fever, weight loss,weakness, recent illness HEENT- denies eye drainage, change in vision, nasal discharge, CVS- denies chest pain, palpitations RESP- denies SOB, cough, wheeze ABD- denies N/V, change in stools, abd pain GU- denies dysuria, hematuria, dribbling, incontinence MSK- denies joint pain, muscle aches, injury Neuro- denies headache, dizziness, syncope, seizure activity       Objective:    BP 106/68  Pulse 90  Temp(Src) 99.2 F (37.3 C) (Oral)  Resp 16  Ht 5\' 5"  (1.651 m)  Wt 127 lb (57.607 kg)  BMI 21.13 kg/m2  SpO2 94% GEN- NAD, alert and oriented x3 HEENT- PERRL, EOMI, non injected sclera, pink conjunctiva, MMM, oropharynx clear Neck- Supple, no thyromegaly CVS- RRR, no murmur, radial 2+ RESP-CTAB, good air movement Ext- no edema             Assessment & Plan:      Problem List Items Addressed This Visit   None      Note: This dictation was prepared with Dragon dictation along with smaller phrase technology. Any transcriptional errors that result from this process are unintentional.

## 2014-05-04 NOTE — Patient Instructions (Signed)
I will check on the prevnar 13 Continue current medications F/U 4 months

## 2014-05-05 ENCOUNTER — Encounter (HOSPITAL_COMMUNITY): Payer: Medicare HMO

## 2014-05-06 ENCOUNTER — Encounter: Payer: Self-pay | Admitting: Family Medicine

## 2014-05-06 NOTE — Assessment & Plan Note (Signed)
At baseline, needs Prevnar 13, discuss with his pulmonologist

## 2014-05-06 NOTE — Assessment & Plan Note (Signed)
No active cancer, followed by urology

## 2014-05-06 NOTE — Assessment & Plan Note (Addendum)
Doing well on low dose ambien, no change to meds, benefits outweigh risk

## 2014-05-10 ENCOUNTER — Encounter (HOSPITAL_COMMUNITY): Payer: Medicare HMO

## 2014-05-12 ENCOUNTER — Encounter (HOSPITAL_COMMUNITY): Payer: Medicare HMO

## 2014-05-17 ENCOUNTER — Encounter (HOSPITAL_COMMUNITY): Payer: Medicare HMO

## 2014-05-19 ENCOUNTER — Encounter (HOSPITAL_COMMUNITY): Payer: Medicare HMO

## 2014-05-20 ENCOUNTER — Telehealth: Payer: Self-pay | Admitting: Family Medicine

## 2014-05-20 NOTE — Telephone Encounter (Signed)
Call placed to patient and patient made aware.  

## 2014-05-20 NOTE — Telephone Encounter (Signed)
Returned call to patient.   Reports that he has nasal drainage and it has begun draining into throat. States that he has throat irritation due to drainage.   States that aside from his runny nose and sore throat, he has no other symptoms.   Denies fever or muscle aches.   Requested to have MD advise.

## 2014-05-20 NOTE — Telephone Encounter (Signed)
[phone] Patient would like a call back regarding some symptoms he is having

## 2014-05-20 NOTE — Telephone Encounter (Signed)
Try flo nase for nasal congestion.

## 2014-05-21 ENCOUNTER — Telehealth: Payer: Self-pay | Admitting: *Deleted

## 2014-05-21 NOTE — Telephone Encounter (Signed)
Call placed to patient.   Reports that his low grade fever has been intermittent through out the day today.   States that he does not feel bad, but he would like to go ahead and schedule appointment for Tuesday.   Appointment scheduled.

## 2014-05-21 NOTE — Telephone Encounter (Signed)
Received call from patient.   Reports that he obtained Flonase per Dr. Samella Parr recommendation for his runny nose. States that medication is effective in stopping the drainage, and his throat is feeling slightly better.   Reports that during the night though, he began running a low grade fever (99.5). States that he has not taken any Tylenol to reduce his temperature.   MD please advise.

## 2014-05-21 NOTE — Telephone Encounter (Signed)
Please call and see if he is having any other symptoms. He is okay to take some Tylenol to see if this stops the fever. If fever returns or he is having upper respiratory symptoms he needs a chest x-ray and office visit

## 2014-05-25 ENCOUNTER — Encounter: Payer: Self-pay | Admitting: Family Medicine

## 2014-05-25 ENCOUNTER — Ambulatory Visit (INDEPENDENT_AMBULATORY_CARE_PROVIDER_SITE_OTHER): Payer: Medicare HMO | Admitting: Family Medicine

## 2014-05-25 VITALS — BP 118/70 | HR 88 | Temp 98.9°F | Resp 20 | Wt 128.0 lb

## 2014-05-25 DIAGNOSIS — J0101 Acute recurrent maxillary sinusitis: Secondary | ICD-10-CM

## 2014-05-25 DIAGNOSIS — J988 Other specified respiratory disorders: Secondary | ICD-10-CM

## 2014-05-25 DIAGNOSIS — J0100 Acute maxillary sinusitis, unspecified: Secondary | ICD-10-CM

## 2014-05-25 MED ORDER — AMOXICILLIN-POT CLAVULANATE 875-125 MG PO TABS: 1.0000 | ORAL_TABLET | Freq: Two times a day (BID) | ORAL | Status: DC

## 2014-05-25 NOTE — Patient Instructions (Signed)
Flu shot/ Prevnar 13 in 2 weeks Take the Augmentin for 1 week  Continue flonase  F/U as previous

## 2014-05-25 NOTE — Progress Notes (Signed)
Patient ID: John Black, male   DOB: Aug 25, 1929, 80 y.o.   [name]: [id number]   Subjective:    Patient ID: John Black, male    DOB: 10-11-1928, 80 y.o.   [name]: [id number]  Patient presents for F/U nasal congestion/ fever  patient here with to follow fever. Last Friday he states that he had a low-grade fever his temperature MAXIMUM TEMPERATURE was 99.9F, this persisted on and off throughout the weekend. He also had some sinus pressure and drainage mostly out of the left side of his nose. He did have a sore throat from the drainage as well. He denies any significant cough he states that he probably just has a couple times but nothing significant. He denies any shortness of breath his oxygen has been his baseline of 2 L. He did take Sudafed over-the-counter and has been using Flonase which is help. He states he is feeling better but he still does get some sinus drainage and still feels fatigued. His wife and came down with similar symptoms a couple days later.    Review Of Systems:  GEN- denies fatigue, fever, weight loss,weakness, recent illness HEENT- denies eye drainage, change in vision, nasal discharge, CVS- denies chest pain, palpitations RESP- denies SOB, cough, wheeze ABD- denies N/V, change in stools, abd pain Neuro- denies headache, dizziness, syncope, seizure activity       Objective:    BP 118/70  Pulse 88  Temp(Src) 98.9 F (37.2 C) (Oral)  Resp 20  Wt 128 lb (58.06 kg) GEN- NAD, alert and oriented x3 HEENT- PERRL, EOMI, non injected sclera, pink conjunctiva, MMM, oropharynx clear, nares clear, TM clear, mild frontal sinus tenderness Neck- Supple, no LAD CVS- RRR, no murmur RESP-course congestion bilat, no wheeze EXT- No edema Pulses- Radial 2+        Assessment & Plan:      Problem List Items Addressed This Visit   None    Visit Diagnoses   Acute recurrent maxillary sinusitis    -  Primary    I think that this is mostly a flare of his chronic  sinusitis he is improving however with now the congestion on his lung exam which was not present before , continue flonase and nasal saline    Relevant Medications       AMOXICILLIN-POT CLAVULANATE 875-125 MG PO TABS    Respiratory infection        Per above based on previous history with prolonged pulmonary abscess I will cover him with Augmentin due to his change in his respiratory exam       Note: This dictation was prepared with Dragon dictation along with smaller phrase technology. Any transcriptional errors that result from this process are unintentional.

## 2014-06-10 ENCOUNTER — Ambulatory Visit (INDEPENDENT_AMBULATORY_CARE_PROVIDER_SITE_OTHER): Payer: Medicare HMO | Admitting: Adult Health

## 2014-06-10 ENCOUNTER — Encounter: Payer: Self-pay | Admitting: Adult Health

## 2014-06-10 VITALS — BP 122/78 | HR 100 | Temp 99.3°F | Ht 66.0 in | Wt 127.8 lb

## 2014-06-10 DIAGNOSIS — Z23 Encounter for immunization: Secondary | ICD-10-CM

## 2014-06-10 DIAGNOSIS — J439 Emphysema, unspecified: Secondary | ICD-10-CM

## 2014-06-10 DIAGNOSIS — J438 Other emphysema: Secondary | ICD-10-CM

## 2014-06-10 MED ORDER — DOXYCYCLINE HYCLATE 100 MG PO TABS: 100.0000 mg | ORAL_TABLET | Freq: Two times a day (BID) | ORAL | Status: DC

## 2014-06-10 NOTE — Assessment & Plan Note (Signed)
Resolving flare  Hold off for additional abx today , will given course to have on hold in case worsens   Plan  Doxcycline 100mg  Twice daily  X 7 days -this is to have on hold if symptoms worsen with discolored mucus.  Mucinex DM Twice daily  As needed  Cough/congestion  Saline nasal rinses As needed   Fluids and rest  Tylenol As needed   Please contact office for sooner follow up if symptoms do not improve or worsen or seek emergency care  follow up Dr. Gwenette Greet as planned and As needed   Flu shot today

## 2014-06-10 NOTE — Patient Instructions (Addendum)
Doxcycline 100mg  Twice daily  X 7 days -this is to have on hold if symptoms worsen with discolored mucus.  Mucinex DM Twice daily  As needed  Cough/congestion  Saline nasal rinses As needed   Fluids and rest  Tylenol As needed   Please contact office for sooner follow up if symptoms do not improve or worsen or seek emergency care  follow up Dr. Gwenette Greet as planned and As needed   Flu shot today

## 2014-06-10 NOTE — Progress Notes (Signed)
   Subjective:    Patient ID: John Black, male    DOB: 04-Feb-1929, 80 y.o.   [name]: [id number]  HPI 80 yo male with known hx of known severe COPD with chronic respiratory failure. 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   06/10/2014 Acute OV  Complains of 3 weeks ago developed runny nose . Went to PCP with sinus congestion , dx w/ bronchitis/sinusitis . Rx Augmentin x 1 week on 9/8. Symptoms improved but just not gone   Complains of  low grade temp, joint/body aches, some wheezing x4 days.  Temp max 99.8. Overall does not feel too bad. Just not over it. Appetite is fair. Symptoms are bete Denies any increased SOB, tightness, cough, purulent sputum, n/v/d, hemoptysis, PND, leg swelling.    Review of Systems Constitutional:   No  weight loss, night sweats,  Fevers, chills,  +fatigue, or  lassitude.  HEENT:   No headaches,  Difficulty swallowing,  Tooth/dental problems, or  Sore throat,                No sneezing, itching, ear ache,  +nasal congestion, post nasal drip,   CV:  No chest pain,  Orthopnea, PND, swelling in lower extremities, anasarca, dizziness, palpitations, syncope.   GI  No heartburn, indigestion, abdominal pain, nausea, vomiting, diarrhea, change in bowel habits, loss of appetite, bloody stools.   Resp:  .  No chest wall deformity  Skin: no rash or lesions.  GU: no dysuria, change in color of urine, no urgency or frequency.  No flank pain, no hematuria   MS:  No joint pain or swelling.  No decreased range of motion.  No back pain.  Psych:  No change in mood or affect. No depression or anxiety.  No memory loss.          Objective:   Physical Exam GEN: A/Ox3; pleasant , NAD, chronically ill appearing - on O2   HEENT:  [place]/AT,  EACs-clear, TMs-wnl, NOSE-clear, THROAT-clear, no lesions, no postnasal drip or exudate noted.   NECK:  Supple w/ fair ROM; no JVD; normal carotid impulses w/o bruits; no thyromegaly or nodules  palpated; no lymphadenopathy.  RESP  Diminished BS , no wheezing no accessory muscle use, no dullness to percussion  CARD:  RRR, no m/r/g  , no peripheral edema, pulses intact, no cyanosis or clubbing.  GI:   Soft & nt; nml bowel sounds; no organomegaly or masses detected.  Musco: Warm bil, no deformities or joint swelling noted.   Neuro: alert, no focal deficits noted.    Skin: Warm, no lesions or rashes         Assessment & Plan:

## 2014-06-14 ENCOUNTER — Telehealth: Payer: Self-pay | Admitting: Pulmonary Disease

## 2014-06-14 NOTE — Telephone Encounter (Signed)
Pt seen by TP on 06/10/14 advised to start Doxy if symptoms worsened. Pt calling with FYI that he started taking Doxy 100mg  today because fever never went away-- pt states that he does not have any mucus or cough, just a fever, running low grade 99.2-99.9 Pt using Tylenol and Ibuprofen (alternating) for fever. Using Mucinex even though there is no cough or congestion present.     Patient Instructions     Doxcycline 100mg  Twice daily X 7 days -this is to have on hold if symptoms worsen with discolored mucus.  Mucinex DM Twice daily As needed Cough/congestion  Saline nasal rinses As needed  Fluids and rest  Tylenol As needed  Please contact office for sooner follow up if symptoms do not improve or worsen or seek emergency care  follow up Dr. Gwenette Greet as planned and As needed  Flu shot today    Please advise Tammy Parrett, if any further rec's for the pt. Thanks.

## 2014-06-14 NOTE — Telephone Encounter (Signed)
Per TP: it is okay that he started the Doxycycline - thanks for letting us know.  If no better in the next couple of days, call the office.  May need another ov or different therapy.  If symptoms worsen, call the office or seek emergency assistance.  Thanks.  Called spoke with patient and discussed TP's recs as stated above.  Pt denies any acute symptoms at this time and will call with update in 2 days' time.  Pt verbalized his understanding to seek emergency assistance if needed for worsening symptoms.  Nothing further needed at this time; will sign off.

## 2014-06-18 NOTE — Progress Notes (Signed)
Ov reviewed.  Agree with plan as outlined.

## 2014-06-23 ENCOUNTER — Other Ambulatory Visit: Payer: Self-pay | Admitting: Family Medicine

## 2014-06-24 ENCOUNTER — Other Ambulatory Visit: Payer: Self-pay | Admitting: Family Medicine

## 2014-06-24 NOTE — Telephone Encounter (Signed)
Ok to refill??  Last office visit 05/25/2014.  Last refill 02/22/2014, #1 refill.

## 2014-06-24 NOTE — Telephone Encounter (Signed)
Refill appropriate and filled per protocol. 

## 2014-06-25 NOTE — Telephone Encounter (Signed)
Medication called to pharmacy. 

## 2014-06-25 NOTE — Telephone Encounter (Signed)
Okay to refill, give 3 refills

## 2014-07-20 ENCOUNTER — Ambulatory Visit (INDEPENDENT_AMBULATORY_CARE_PROVIDER_SITE_OTHER): Payer: Medicare HMO | Admitting: Family Medicine

## 2014-07-20 DIAGNOSIS — Z23 Encounter for immunization: Secondary | ICD-10-CM

## 2014-08-03 ENCOUNTER — Telehealth: Payer: Self-pay | Admitting: Pulmonary Disease

## 2014-08-03 NOTE — Telephone Encounter (Signed)
Pt states that he is considering on changing his Medicare insurer to [employer]. Was concerned with the credibility of this company. Pt states that he has checked with [facility] and this organization does exist and [employer] is a part of this "team" Pt is using [employer] and would like to be sure that [employer] will accept [employer] and continue to pay costs of O2 Pt states that he has spoken with both [employer] and [employer] and neither have heard of the other and are hesitant to move forward with providing patient with [employer] system. Pt would like rec's on what to do next??  Pt requests that we contact him on his cell # [phone] if no answer on home number.   John Black please advise if you can help?

## 2014-08-05 NOTE — Telephone Encounter (Signed)
Spoke to pt he is aware that [email or web address]  will have to ck on this for [place] she has not heard of the [employer] advantage before she is to call him back after she knows something [place]

## 2014-08-11 ENCOUNTER — Encounter: Payer: Self-pay | Admitting: Family Medicine

## 2014-08-16 ENCOUNTER — Other Ambulatory Visit: Payer: Self-pay | Admitting: Family Medicine

## 2014-08-17 NOTE — Telephone Encounter (Signed)
Ok to refill??  Last office visit 05/04/2014.  Last refill 02/26/2014, #2 refills.

## 2014-08-17 NOTE — Telephone Encounter (Signed)
Medication called to pharmacy. 

## 2014-08-17 NOTE — Telephone Encounter (Signed)
okay

## 2014-08-20 ENCOUNTER — Encounter: Payer: Self-pay | Admitting: Pulmonary Disease

## 2014-08-20 ENCOUNTER — Ambulatory Visit (INDEPENDENT_AMBULATORY_CARE_PROVIDER_SITE_OTHER): Payer: Medicare HMO | Admitting: Pulmonary Disease

## 2014-08-20 VITALS — BP 132/70 | HR 90 | Temp 97.4°F | Ht 66.0 in | Wt 132.4 lb

## 2014-08-20 DIAGNOSIS — J438 Other emphysema: Secondary | ICD-10-CM

## 2014-08-20 DIAGNOSIS — J9611 Chronic respiratory failure with hypoxia: Secondary | ICD-10-CM

## 2014-08-20 MED ORDER — BUDESONIDE-FORMOTEROL FUMARATE 160-4.5 MCG/ACT IN AERO: 2.0000 | INHALATION_SPRAY | Freq: Two times a day (BID) | RESPIRATORY_TRACT | Status: DC

## 2014-08-20 NOTE — Patient Instructions (Signed)
Continue on your current medications, and oxygen Stay active, and work on conditioning and weight gain. followup with me again in 19mos

## 2014-08-20 NOTE — Progress Notes (Signed)
   Subjective:    Patient ID: John Black, male    DOB: 1928/12/12, 80 y.o.   [name]: [id number]  HPI Patient comes in today for follow-up of his known severe COPD with chronic respiratory failure. He did have a flareup in September of this year, but quickly returned to baseline and has been doing well since. He denies any significant chest congestion or mucus, and is staying fairly active.   Review of Systems  Constitutional: Negative for fever and unexpected weight change.  HENT: Negative for congestion, dental problem, ear pain, nosebleeds, postnasal drip, rhinorrhea, sinus pressure, sneezing, sore throat and trouble swallowing.   Eyes: Negative for redness and itching.  Respiratory: Negative for cough, chest tightness, shortness of breath and wheezing.   Cardiovascular: Negative for palpitations and leg swelling.  Gastrointestinal: Negative for nausea and vomiting.  Genitourinary: Negative for dysuria.  Musculoskeletal: Negative for joint swelling.  Skin: Negative for rash.  Neurological: Negative for headaches.  Hematological: Does not bruise/bleed easily.  Psychiatric/Behavioral: Negative for dysphoric mood. The patient is not nervous/anxious.        Objective:   Physical Exam Thin male in no acute distress Nose without purulence or discharge noted Neck without lymphadenopathy or thyromegaly Chest with very diminished breath sounds, no active wheezing Cardiac exam with regular rate and rhythm Lower extremities without edema, no cyanosis Alert and oriented, moves all 4 extremities.       Assessment & Plan:

## 2014-08-20 NOTE — Assessment & Plan Note (Signed)
The patient appears to be stable from a COPD standpoint on his current medical regimen and supplemental oxygen. He is trying to stay as active as possible, and is back to baseline after a flareup in September of this year. I have asked him to continue on his current bronchodilators, and to work on conditioning program and increase caloric intake for weight gain.

## 2014-10-04 ENCOUNTER — Ambulatory Visit: Payer: Medicare HMO | Admitting: Family Medicine

## 2014-10-13 ENCOUNTER — Encounter: Payer: Self-pay | Admitting: Family Medicine

## 2014-10-13 ENCOUNTER — Ambulatory Visit (INDEPENDENT_AMBULATORY_CARE_PROVIDER_SITE_OTHER): Payer: Medicare HMO | Admitting: Family Medicine

## 2014-10-13 ENCOUNTER — Telehealth: Payer: Self-pay | Admitting: *Deleted

## 2014-10-13 VITALS — BP 126/80 | HR 68 | Temp 98.2°F | Resp 18 | Ht 66.0 in | Wt 131.5 lb

## 2014-10-13 DIAGNOSIS — G4700 Insomnia, unspecified: Secondary | ICD-10-CM

## 2014-10-13 DIAGNOSIS — E039 Hypothyroidism, unspecified: Secondary | ICD-10-CM

## 2014-10-13 DIAGNOSIS — J438 Other emphysema: Secondary | ICD-10-CM

## 2014-10-13 DIAGNOSIS — J9611 Chronic respiratory failure with hypoxia: Secondary | ICD-10-CM

## 2014-10-13 DIAGNOSIS — E785 Hyperlipidemia, unspecified: Secondary | ICD-10-CM

## 2014-10-13 MED ORDER — IPRATROPIUM-ALBUTEROL 20-100 MCG/ACT IN AERS: 1.0000 | INHALATION_SPRAY | Freq: Four times a day (QID) | RESPIRATORY_TRACT | Status: DC | PRN

## 2014-10-13 MED ORDER — ZOLPIDEM TARTRATE 10 MG PO TABS: 5.0000 mg | ORAL_TABLET | Freq: Every evening | ORAL | Status: DC | PRN

## 2014-10-13 NOTE — Assessment & Plan Note (Signed)
Doing well on current medications he's not had any side effects or any falls we'll continue to monitor

## 2014-10-13 NOTE — Telephone Encounter (Signed)
Received call from patient.   Reports that new prescription he received for Combivent is for Respimat. States that he has been using older version (18-103 mcg/act).   Requested new printed prescription for Combivent.   MD please advise.

## 2014-10-13 NOTE — Assessment & Plan Note (Signed)
Currently at his baseline continuous oxygen therapy he will also continue with current inhalers

## 2014-10-13 NOTE — Progress Notes (Signed)
Patient ID: John Black, male   DOB: 1929-07-05, 81 y.o.   [name]: [id number]   Subjective:    Patient ID: John Black, male    DOB: Jul 12, 1929, 81 y.o.   [name]: [id number]  Patient presents for 4 month F/U  patient here to follow-up chronic medical problems. He has no specific concerns today. He continues to wear his oxygen on 2 L for his COPD and chronic respiratory failure. He requests refills on his medications today.  He is due for recheck on his thyroid as well as his cholesterol.  Chronic insomnia he is doing well with the use of Ambien and melatonin  Review Of Systems:  GEN- denies fatigue, fever, weight loss,weakness, recent illness HEENT- denies eye drainage, change in vision, nasal discharge, CVS- denies chest pain, palpitations RESP- denies SOB, cough, wheeze ABD- denies N/V, change in stools, abd pain GU- denies dysuria, hematuria, dribbling, incontinence MSK- denies joint pain, muscle aches, injury Neuro- denies headache, dizziness, syncope, seizure activity       Objective:    BP 126/80 mmHg  Pulse 68  Temp(Src) 98.2 F (36.8 C) (Oral)  Resp 18  Ht 5\' 6"  (1.676 m)  Wt 131 lb 8 oz (59.648 kg)  BMI 21.23 kg/m2  SpO2 97% GEN- NAD, alert and oriented x3 HEENT- PERRL, EOMI, non injected sclera, pink conjunctiva, MMM, oropharynx clear Neck- Supple, no thyromegaly CVS- RRR, no murmur RESP- clear, decreased at bases, no rales, no wheeze, normal WOB with oxygen EXT- No edema Pulses- Radial 2+        Assessment & Plan:      Problem List Items Addressed This Visit      Unprioritized   Mild hyperlipidemia - Primary   Relevant Orders   CBC with Differential/Platelet   Comprehensive metabolic panel   Lipid panel   Insomnia   Hypothyroidism   Relevant Orders   TSH   T3, free   COPD (chronic obstructive pulmonary disease) with emphysema   Chronic respiratory failure      Note: This dictation was prepared with Dragon dictation along with  smaller phrase technology. Any transcriptional errors that result from this process are unintentional.

## 2014-10-13 NOTE — Assessment & Plan Note (Signed)
Recheck thyroid function test continue current dose of Synthroid for now

## 2014-10-13 NOTE — Telephone Encounter (Signed)
Call pharmacy and verify, but I dont think they make the old version anymore

## 2014-10-13 NOTE — Patient Instructions (Signed)
Continue current medications Refills to be done Get the labs done  F/U 4 months

## 2014-10-13 NOTE — Telephone Encounter (Signed)
Call placed to pharmacy.   Was advised that Combivent HFA is no longer in production.   Noted Respimat is preferred tier 3 medication. Tier 3 medications do have higher deductibles.

## 2014-10-14 NOTE — Telephone Encounter (Signed)
Received call from patient.    Reports that he has been in contact with mail order pharmacy in [place]. States that pharmacy continues to dispense Combivent HFA with prescription from [facility]. Reports that [facility] prescriptions are valid in [place].   Reports that he can get (3) inhalers for $72.00 with prescription.   Ok to print?

## 2014-10-15 MED ORDER — IPRATROPIUM-ALBUTEROL 18-103 MCG/ACT IN AERO: 2.0000 | INHALATION_SPRAY | RESPIRATORY_TRACT | Status: DC | PRN

## 2014-10-15 NOTE — Telephone Encounter (Signed)
Okay to wrist script for [facility]

## 2014-10-15 NOTE — Telephone Encounter (Signed)
Prescription printed and patient made aware to come to office to pick up.  

## 2014-10-15 NOTE — Addendum Note (Signed)
Addended by: Sheral Flow on: 10/15/2014 08:57 AM   Modules accepted: Orders, Medications

## 2014-10-29 LAB — CBC WITH DIFFERENTIAL/PLATELET
Basophils Absolute: 0 10*3/uL (ref 0.0–0.1)
Basophils Relative: 0 % (ref 0–1)
Eosinophils Absolute: 0.4 10*3/uL (ref 0.0–0.7)
Eosinophils Relative: 5 % (ref 0–5)
HCT: 42.9 % (ref 39.0–52.0)
Hemoglobin: 14.2 g/dL (ref 13.0–17.0)
Lymphocytes Relative: 35 % (ref 12–46)
Lymphs Abs: 2.6 10*3/uL (ref 0.7–4.0)
MCH: 28.2 pg (ref 26.0–34.0)
MCHC: 33.1 g/dL (ref 30.0–36.0)
MCV: 85.3 fL (ref 78.0–100.0)
MPV: 9.5 fL (ref 8.6–12.4)
Monocytes Absolute: 0.8 10*3/uL (ref 0.1–1.0)
Monocytes Relative: 11 % (ref 3–12)
Neutro Abs: 3.6 10*3/uL (ref 1.7–7.7)
Neutrophils Relative %: 49 % (ref 43–77)
Platelets: 279 10*3/uL (ref 150–400)
RBC: 5.03 MIL/uL (ref 4.22–5.81)
RDW: 14.8 % (ref 11.5–15.5)
WBC: 7.3 10*3/uL (ref 4.0–10.5)

## 2014-10-29 LAB — COMPREHENSIVE METABOLIC PANEL
ALT: 23 U/L (ref 0–53)
AST: 23 U/L (ref 0–37)
Albumin: 4.1 g/dL (ref 3.5–5.2)
Alkaline Phosphatase: 53 U/L (ref 39–117)
BUN: 18 mg/dL (ref 6–23)
CO2: 30 mEq/L (ref 19–32)
Calcium: 9.4 mg/dL (ref 8.4–10.5)
Chloride: 102 mEq/L (ref 96–112)
Creat: 0.85 mg/dL (ref 0.50–1.35)
Glucose, Bld: 90 mg/dL (ref 70–99)
Potassium: 4.3 mEq/L (ref 3.5–5.3)
Sodium: 139 mEq/L (ref 135–145)
Total Bilirubin: 0.5 mg/dL (ref 0.2–1.2)
Total Protein: 6.6 g/dL (ref 6.0–8.3)

## 2014-10-29 LAB — T3, FREE: T3, Free: 3.1 pg/mL (ref 2.3–4.2)

## 2014-10-29 LAB — LIPID PANEL
Cholesterol: 231 mg/dL — ABNORMAL HIGH (ref 0–200)
HDL: 72 mg/dL (ref >39)
LDL Cholesterol: 135 mg/dL — ABNORMAL HIGH (ref 0–99)
Total CHOL/HDL Ratio: 3.2 Ratio
Triglycerides: 119 mg/dL (ref <150)
VLDL: 24 mg/dL (ref 0–40)

## 2014-10-29 LAB — TSH: TSH: 3.192 u[IU]/mL (ref 0.350–4.500)

## 2014-11-02 ENCOUNTER — Encounter: Payer: Self-pay | Admitting: *Deleted

## 2014-11-02 ENCOUNTER — Other Ambulatory Visit: Payer: Self-pay | Admitting: *Deleted

## 2014-11-02 MED ORDER — SIMVASTATIN 10 MG PO TABS: 10.0000 mg | ORAL_TABLET | Freq: Every day | ORAL | Status: DC

## 2014-11-08 ENCOUNTER — Telehealth: Payer: Self-pay | Admitting: Pulmonary Disease

## 2014-11-08 NOTE — Telephone Encounter (Signed)
Patient assistance forms placed in [place] green folder.

## 2014-11-08 NOTE — Telephone Encounter (Signed)
Pt came in to drop off forms for [employer], he wants to come back to pick these up when completed, he also is waiting in lobby and wants to speak with the nurse about cholesterol meds he has been placed on by PCP that [facility] has given recs about. I will place forms in [facility] box

## 2014-11-08 NOTE — Telephone Encounter (Signed)
Called and spoke to pt. Pt stated he was informed by his PCP he has elevated cholesterol and was placed on Zocor. Pt stated he has heard negative effects with statins and is questioning if he should take it or if he should try another classification of med for his cholesterol. Pt is requesting [facility]'s opinion if Zocor has more benefits than risk. Pt also stated he will drop off pt assistance forms for his Symbicort for [place] to complete and mail off.   [place] please advise.

## 2014-11-08 NOTE — Telephone Encounter (Signed)
Attempted to call. No answer, no option to leave a message. Will try back.

## 2014-11-08 NOTE — Telephone Encounter (Signed)
Pt has been given forms for his PA.

## 2014-11-08 NOTE — Telephone Encounter (Signed)
Let him know that I am not a cholesterol expert.  He really needs to discuss the benefits vs risk with his primary, and make a decision from there.

## 2014-11-12 NOTE — Telephone Encounter (Signed)
mindy can this message be closed?  Thanks!

## 2014-11-16 ENCOUNTER — Encounter: Payer: Self-pay | Admitting: Family Medicine

## 2014-11-16 ENCOUNTER — Ambulatory Visit (HOSPITAL_COMMUNITY)
Admission: RE | Admit: 2014-11-16 | Discharge: 2014-11-16 | Disposition: A | Discharge status: Home / Self Care | Payer: Medicare HMO | Source: Ambulatory Visit | Attending: Family Medicine | Admitting: Family Medicine

## 2014-11-16 ENCOUNTER — Ambulatory Visit (INDEPENDENT_AMBULATORY_CARE_PROVIDER_SITE_OTHER): Payer: Medicare HMO | Admitting: Family Medicine

## 2014-11-16 VITALS — BP 128/62 | HR 84 | Temp 98.4°F | Resp 18 | Ht 66.0 in | Wt 133.0 lb

## 2014-11-16 DIAGNOSIS — M25561 Pain in right knee: Secondary | ICD-10-CM

## 2014-11-16 DIAGNOSIS — M25461 Effusion, right knee: Secondary | ICD-10-CM | POA: Diagnosis not present

## 2014-11-16 LAB — CBC W/MCH & 3 PART DIFF

## 2014-11-16 LAB — URIC ACID: Uric Acid, Serum: 4 mg/dL (ref 4.0–7.8)

## 2014-11-16 IMAGING — DX DG KNEE COMPLETE 4+V*R*
4 series · 4 of 4 positions shown · non-contrast
Comparison: None.

CLINICAL DATA: Right knee pain and diffusion

EXAM:
RIGHT KNEE - COMPLETE 4+ VIEW

[knee ap]
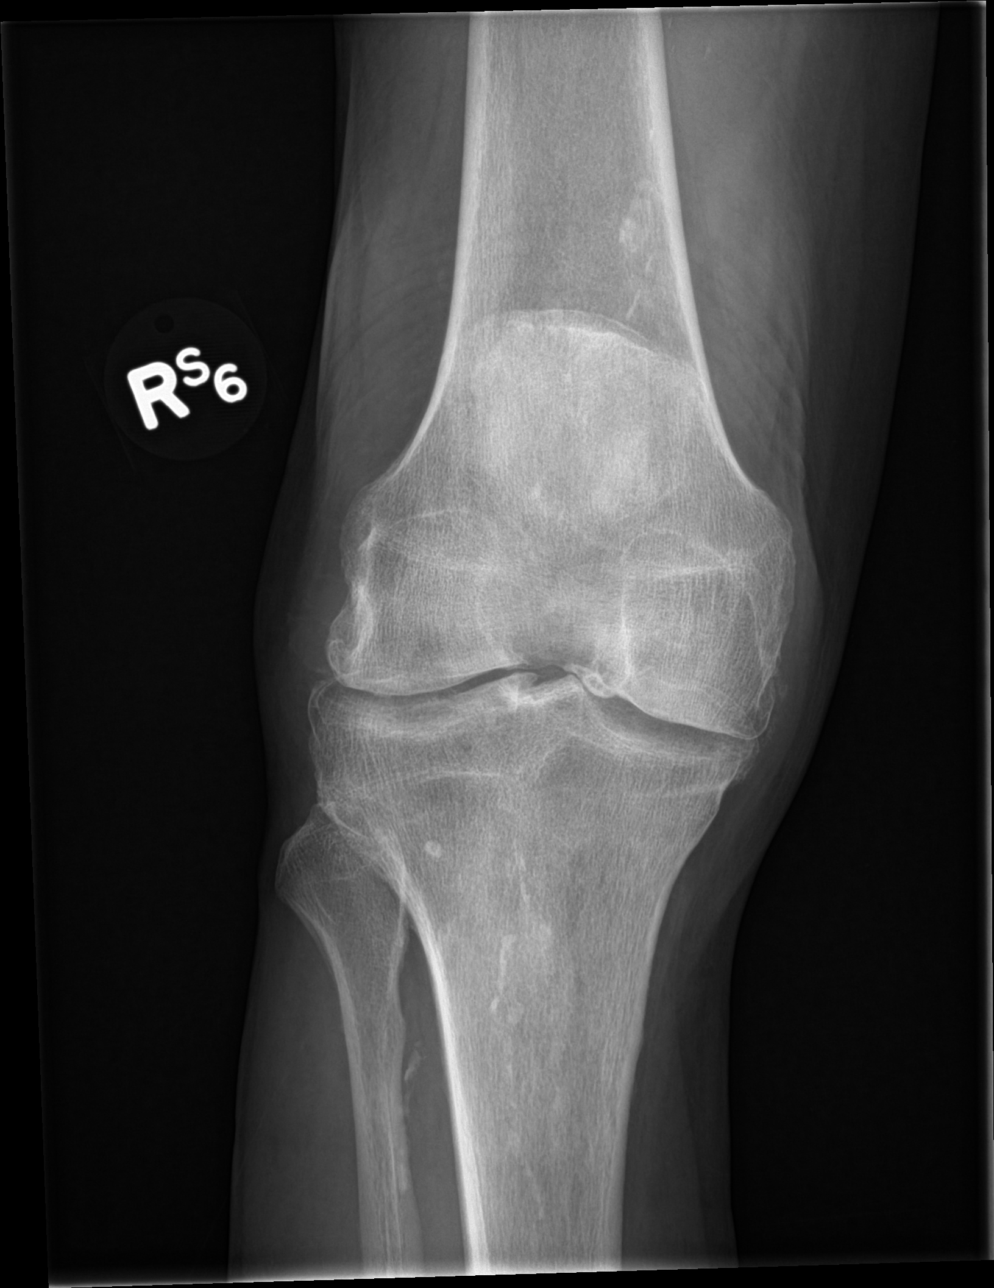

[knee obl (1 of 2)]
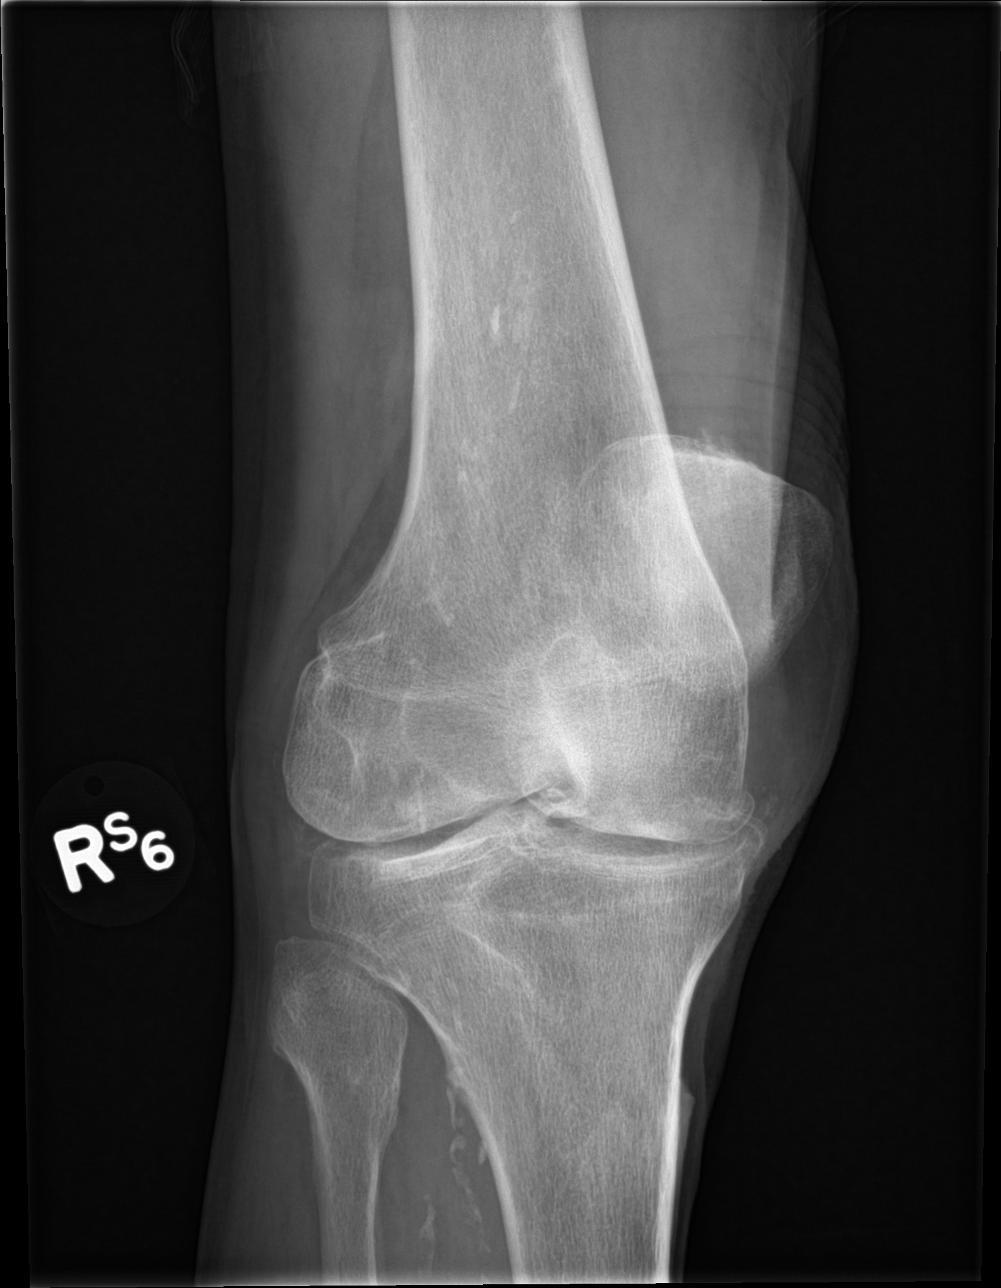

[knee obl (2 of 2)]
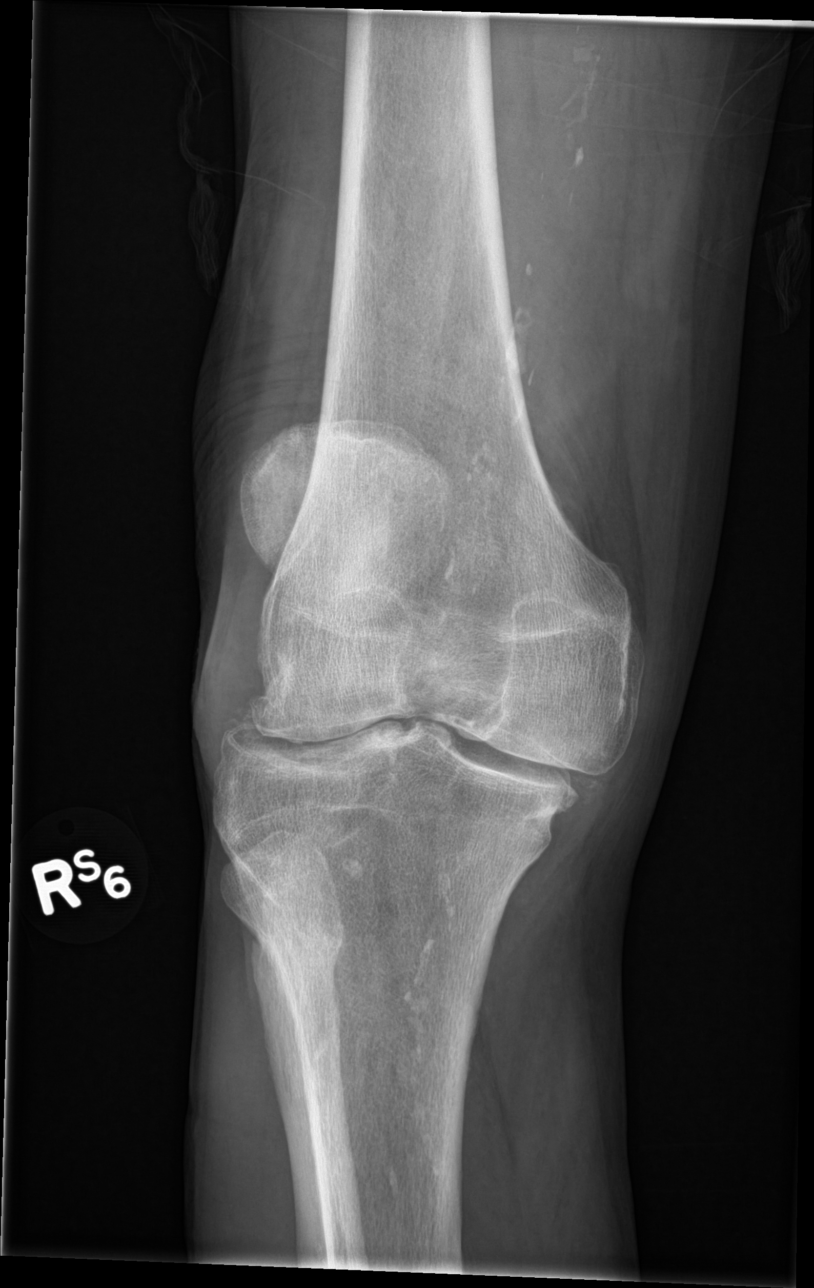

[knee lat]
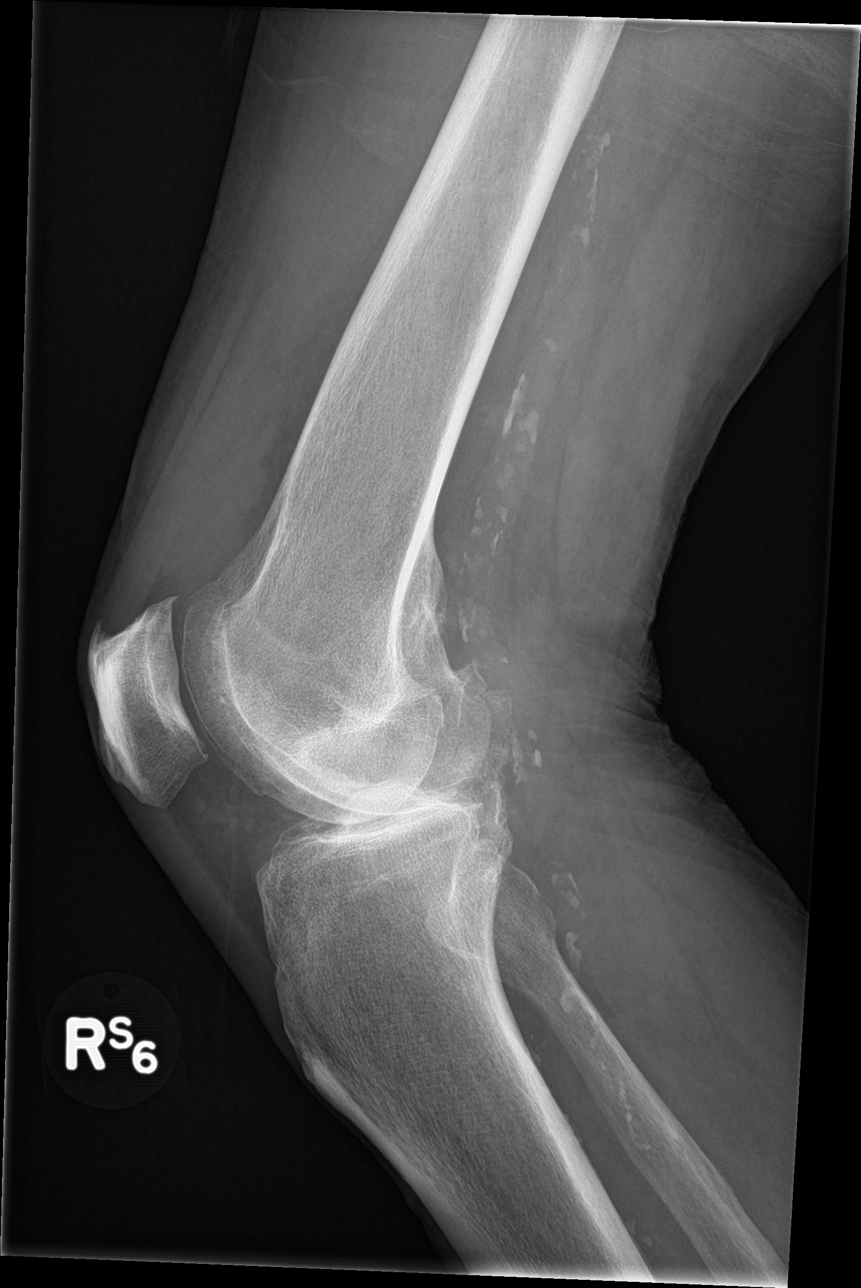

[4 of 4 positions shown; findings below may reference images not displayed]

FINDINGS: Moderate tricompartmental degenerative change with joint space
narrowing and spurring in all 3 compartments. Moderate joint
effusion.

Negative for fracture.

Arterial calcification.
IMPRESSION: Moderate tricompartmental degenerative change with joint effusion.

## 2014-11-16 NOTE — Progress Notes (Signed)
Patient ID: John Black, male   DOB: [birth year], 81 y.o.   [name]: [id number]   Subjective:    Patient ID: John Black, male    DOB: 1929-07-17, 81 y.o.   [name]: [id number]  Patient presents for R Knee Pain  Patient with right knee pain for the past 24 hours. He states he woke up with swelling on his knee he took an ibuprofen early this morning and it helped healso use ice. He has no known injury and was feeling fine yesterday. He has had multiple problems with the right knee was in his younger days he states he's had multiple taps done on the knee. No history of any gout. He denies any locking of the knee or giving out.   Review Of Systems:  GEN- denies fatigue, fever, weight loss,weakness, recent illness HEENT- denies eye drainage, change in vision, nasal discharge, CVS- denies chest pain, palpitations RESP- denies SOB, cough, wheeze MSK- + joint pain, muscle aches, injury Neuro- denies headache, dizziness, syncope, seizure activity       Objective:    BP 128/62 mmHg  Pulse 84  Temp(Src) 98.4 F (36.9 C) (Oral)  Resp 18  Ht 5\' 6"  (1.676 m)  Wt 133 lb (60.328 kg)  BMI 21.48 kg/m2  SpO2 92% GEN- NAD, alert and oriented x3 MSK- Right knee- Small effususion palpated, TTP over entire knee, mild warmth, no erythema, fair ROM, +crepitus, left knee- no effusion, normal inspection, +crepitus, fair ROM         Assessment & Plan:      Problem List Items Addressed This Visit    None    Visit Diagnoses    Knee effusion, right    -  Primary    Knee pain with acute effusion, mostly likely related to DJD especially with history but no injury, he did do a lot of walking this past weekend at a basketball game. Will obtain xray of knee, he wants to hold off on ortho and fluid removal Will check CBC and uric acid based on the acute nature Discussed NSAIDS, IBUPROFEN works well for him, will use TID, with ICE, given supportive knee brace in office    Relevant Orders    DG  Knee Complete 4 Views Right    Uric Acid    CBC w/MCH & 3 Part Diff    Right knee pain        Relevant Orders    DG Knee Complete 4 Views Right       Note: This dictation was prepared with Dragon dictation along with smaller phrase technology. Any transcriptional errors that result from this process are unintentional.

## 2014-11-16 NOTE — Patient Instructions (Addendum)
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

## 2014-11-17 LAB — CBC WITH DIFFERENTIAL/PLATELET
Basophils Absolute: 0 10*3/uL (ref 0.0–0.1)
Basophils Relative: 0 % (ref 0–1)
Eosinophils Absolute: 0.2 10*3/uL (ref 0.0–0.7)
Eosinophils Relative: 2 % (ref 0–5)
HCT: 41.8 % (ref 39.0–52.0)
Hemoglobin: 13.9 g/dL (ref 13.0–17.0)
Lymphocytes Relative: 20 % (ref 12–46)
Lymphs Abs: 2.1 10*3/uL (ref 0.7–4.0)
MCH: 28.8 pg (ref 26.0–34.0)
MCHC: 33.3 g/dL (ref 30.0–36.0)
MCV: 86.5 fL (ref 78.0–100.0)
MPV: 9 fL (ref 8.6–12.4)
Monocytes Absolute: 1.2 10*3/uL — ABNORMAL HIGH (ref 0.1–1.0)
Monocytes Relative: 12 % (ref 3–12)
Neutro Abs: 6.8 10*3/uL (ref 1.7–7.7)
Neutrophils Relative %: 66 % (ref 43–77)
Platelets: 297 10*3/uL (ref 150–400)
RBC: 4.83 MIL/uL (ref 4.22–5.81)
RDW: 14.5 % (ref 11.5–15.5)
WBC: 10.3 10*3/uL (ref 4.0–10.5)

## 2014-12-02 ENCOUNTER — Telehealth: Payer: Self-pay | Admitting: Pulmonary Disease

## 2014-12-02 ENCOUNTER — Ambulatory Visit: Payer: Medicare HMO | Admitting: Physician Assistant

## 2014-12-02 ENCOUNTER — Encounter: Payer: Self-pay | Admitting: Emergency Medicine

## 2014-12-02 ENCOUNTER — Ambulatory Visit (INDEPENDENT_AMBULATORY_CARE_PROVIDER_SITE_OTHER): Payer: Medicare HMO | Admitting: Emergency Medicine

## 2014-12-02 VITALS — BP 138/64 | HR 110 | Ht 66.0 in | Wt 132.2 lb

## 2014-12-02 DIAGNOSIS — J438 Other emphysema: Secondary | ICD-10-CM

## 2014-12-02 MED ORDER — LEVOFLOXACIN 500 MG PO TABS: 500.0000 mg | ORAL_TABLET | Freq: Every day | ORAL | Status: DC

## 2014-12-02 MED ORDER — PREDNISONE 10 MG PO TABS: ORAL_TABLET | ORAL | Status: DC

## 2014-12-02 NOTE — Assessment & Plan Note (Signed)
With an acute exacerbation characterized by wheezing, dyspnea, increased hypoxemia, and purulent sputum. Suspect that this was brought on by either the allergy season or less likely a viral upper respiratory infection.  I will treat him with Levaquin for 7 days and with a prednisone taper. He will continue his scheduled and when necessary bronchodilators.  Follow with Dr.Clance on 12/24/14

## 2014-12-02 NOTE — Telephone Encounter (Signed)
Pt c/o fever, chest congestion with yellow mucus production, chest tightness and SOB. Pt reports O2 levels in lower 80s yesterday.  Appt scheduled for 12/03/14 with [facility] - pt requests to be seen today if possible by someone.  Pt states that fever has been ranging 100-102 (worse at night)  Pt aware that [facility] has no openings available today. Pt rescheduled for today with RB at 11:45. Nothing further needed.

## 2014-12-02 NOTE — Progress Notes (Signed)
Acute Visit 12/02/14 --  HPI: 81 year old gentleman followed by Dr.Clance  for severe COPD with associated chronic hypoxemic respiratory failure. He was last seen by [facility] in December 2015. He developed URI sx about a week ago, but no fever, aches, GI sx. More recently he did have a fever and some aching in back. He has developed wheeze, cough, some decrease in his exertional tolerance. He is coughing up purulent mucous.  He has desaturated to mid 80's. His prn combivent use is stable at 1-2x a day. He has been on symbicort reliably.    Past Medical History  Diagnosis Date  . Emphysema   . Allergic rhinitis   . Prostate cancer   . Hypothyroid   . Cataract     s/p removal  . PNA (pneumonia)   . Chronic respiratory failure     oxygen 3L at home     Family History  Problem Relation Age of Onset  . Heart disease Mother   . Prostate cancer Father      History   Social History  . Marital Status: Married    Spouse Name: N/A  . Number of Children: N/A  . Years of Education: N/A   Occupational History  . engineer    Social History Main Topics  . Smoking status: Former Smoker -- 1.50 packs/day for 50 years    Types: Cigarettes    Quit date: 09/17/2008  . Smokeless tobacco: Never Used  . Alcohol Use: 0.6 oz/week    1 Glasses of wine per week     Comment: each evening  . Drug Use: No  . Sexual Activity: No   Other Topics Concern  . Not on file   Social History Narrative     Allergies  Allergen Reactions  . Morphine     REACTION: sweats     Outpatient Prescriptions Prior to Visit  Medication Sig Dispense Refill  . albuterol-ipratropium (COMBIVENT) 18-103 MCG/ACT inhaler Inhale 2 puffs into the lungs every 4 (four) hours as needed for wheezing or shortness of breath. 3 Inhaler 3  . budesonide-formoterol (SYMBICORT) 160-4.5 MCG/ACT inhaler Inhale 2 puffs into the lungs 2 (two) times daily. 1 Inhaler 11  . Calcium-Magnesium-Vitamin D (CITRACAL CALCIUM+D PO) Take 1  tablet by mouth every morning.     . [facility] STOOL SOFTENER 100 MG capsule TAKE 1 CAPSULE (100 MG TOTAL) BY MOUTH 2 (TWO) TIMES DAILY. STOOL SOFTNER 60 capsule 3  . ibuprofen (ADVIL,MOTRIN) 400 MG tablet Take 400 mg by mouth every 6 (six) hours as needed for fever, headache or mild pain.     Marland Kitchen levothyroxine (SYNTHROID, LEVOTHROID) 75 MCG tablet Take 1 tablet (75 mcg total) by mouth daily. 90 tablet 3  . Melatonin 3 MG TABS Take 1-2 tablets by mouth at bedtime.    . Multiple Vitamin (MULTIVITAMIN WITH MINERALS) TABS tablet Take 1 tablet by mouth every morning.    . simvastatin (ZOCOR) 10 MG tablet Take 1 tablet (10 mg total) by mouth at bedtime. 30 tablet 3  . tamsulosin (FLOMAX) 0.4 MG CAPS capsule Take 0.4 capsules by mouth daily.    . traMADol (ULTRAM) 50 MG tablet TAKE 1 TABLET EVERY 8 HOURS AS NEEDED 60 tablet 2  . zolpidem (AMBIEN) 10 MG tablet Take 0.5-1 tablets (5-10 mg total) by mouth at bedtime as needed. 30 tablet 3   No facility-administered medications prior to visit.    Filed Vitals:   12/02/14 1200  BP: 138/64  Pulse: 110  Height: 5'  6" (1.676 m)  Weight: 132 lb 3.2 oz (59.966 kg)  SpO2: 90%   Gen: Pleasant, thin man, in no distress on O2,  normal affect  ENT: No lesions,  mouth clear,  oropharynx clear, no postnasal drip  Neck: No JVD, no TMG, no carotid bruits  Lungs: No use of accessory muscles, VERY distant with some soft expiratory wheezes  Cardiovascular: RRR, heart sounds normal, no murmur or gallops, no peripheral edema  Musculoskeletal: No deformities, no cyanosis or clubbing  Neuro: alert, non focal  Skin: Warm, no lesions or rashes  f COPD (chronic obstructive pulmonary disease) with emphysema With an acute exacerbation characterized by wheezing, dyspnea, increased hypoxemia, and purulent sputum. Suspect that this was brought on by either the allergy season or less likely a viral upper respiratory infection.  I will treat him with Levaquin for 7 days and  with a prednisone taper. He will continue his scheduled and when necessary bronchodilators.  Follow with Dr.Clance on 12/24/14

## 2014-12-02 NOTE — Patient Instructions (Signed)
Please take Levaquin 500 mg daily for the next 7 days Please take prednisone as directed until completely gone Continue your other medications as you have been taking them Follow with Dr Gwenette Greet or with Rexene Edison in 2 weeks to assess your improvement.

## 2014-12-03 ENCOUNTER — Ambulatory Visit: Payer: Medicare HMO | Admitting: Family Medicine

## 2014-12-03 ENCOUNTER — Ambulatory Visit: Payer: Medicare HMO | Admitting: Pulmonary Disease

## 2014-12-03 NOTE — Telephone Encounter (Signed)
Pt arrived at appt. Nothing further needed.

## 2014-12-09 ENCOUNTER — Telehealth: Payer: Self-pay | Admitting: Family Medicine

## 2014-12-09 NOTE — Telephone Encounter (Signed)
Call placed to patient. No answer. No VM.  

## 2014-12-09 NOTE — Telephone Encounter (Signed)
[phone] PT called and left VM stating he was wanting to speak to you about a letter he received in mail from his insurance company.

## 2014-12-09 NOTE — Telephone Encounter (Signed)
Received letter from patient insurance stating that temporary supply of Ambien was given and QL PA would be required.   PA submitted.

## 2014-12-09 NOTE — Telephone Encounter (Signed)
Pt has called back and left a new number to call [phone]

## 2014-12-10 NOTE — Telephone Encounter (Signed)
Received PA determination.   PA approved [id number]/17/2015.  Call placed to patient and patient made aware.

## 2014-12-24 ENCOUNTER — Ambulatory Visit (INDEPENDENT_AMBULATORY_CARE_PROVIDER_SITE_OTHER): Payer: Medicare HMO | Admitting: Pulmonary Disease

## 2014-12-24 ENCOUNTER — Encounter: Payer: Self-pay | Admitting: Pulmonary Disease

## 2014-12-24 VITALS — BP 138/76 | HR 84 | Temp 97.7°F | Ht 66.0 in | Wt 139.0 lb

## 2014-12-24 DIAGNOSIS — J9611 Chronic respiratory failure with hypoxia: Secondary | ICD-10-CM | POA: Diagnosis not present

## 2014-12-24 DIAGNOSIS — J438 Other emphysema: Secondary | ICD-10-CM

## 2014-12-24 NOTE — Progress Notes (Signed)
   Subjective:    Patient ID: DEJOHN IBARRA, male    DOB: [birth year], 81 y.o.   [name]: [id number]  HPI The patient comes in today for follow-up of his known severe COPD with chronic respiratory failure. He has had a recent acute exacerbation, and was treated with a course of antibiotics and prednisone. He is much improved, and feels that he is back to his usual baseline. He is trying to stay active, but is not participating in any type of conditioning program.   Review of Systems  Constitutional: Negative for fever and unexpected weight change.  HENT: Negative for congestion, dental problem, ear pain, nosebleeds, postnasal drip, rhinorrhea, sinus pressure, sneezing, sore throat and trouble swallowing.   Eyes: Negative for redness and itching.  Respiratory: Positive for shortness of breath and wheezing. Negative for cough and chest tightness.   Cardiovascular: Negative for palpitations and leg swelling.  Gastrointestinal: Negative for nausea and vomiting.  Genitourinary: Negative for dysuria.  Musculoskeletal: Negative for joint swelling.  Skin: Negative for rash.  Neurological: Negative for headaches.  Hematological: Does not bruise/bleed easily.  Psychiatric/Behavioral: Negative for dysphoric mood. The patient is not nervous/anxious.        Objective:   Physical Exam Thin male in no acute distress Nose without purulence or discharge noted Neck without lymphadenopathy or thyromegaly Chest with very diminished breath sounds, no active wheezing Cardiac exam with regular rate and rhythm Lower extremities without significant edema, no cyanosis Alert and oriented, moves all 4 extremities.       Assessment & Plan:

## 2014-12-24 NOTE — Assessment & Plan Note (Signed)
The patient is doing well after his recent acute exacerbation that was treated with antibiotics and a course of prednisone. He feels that he is back to his usual baseline, and has no bronchospasm on exam today. I have stressed to him the need to get back to some type of conditioning program, and he has already been through pulmonary rehabilitation. He would like to try to do this at the [facility] before going back to rehabilitation. He also is not on a long-acting anticholinergic, but has tried Spiriva HandiHaler in the past with no change in his breathing. Perhaps he would do better with the Respimat which has better lung deposition. He will let me know if he feels that his breathing is trending downward.

## 2014-12-24 NOTE — Patient Instructions (Signed)
Continue on current meds. Work on conditioning at [employer], but would be happy to send you back to pulmonary rehab. followup with Dr. Lamonte Sakai in 75mos if doing well.

## 2015-02-11 ENCOUNTER — Encounter: Payer: Self-pay | Admitting: Family Medicine

## 2015-02-11 ENCOUNTER — Ambulatory Visit (INDEPENDENT_AMBULATORY_CARE_PROVIDER_SITE_OTHER): Payer: Medicare HMO | Admitting: Family Medicine

## 2015-02-11 ENCOUNTER — Ambulatory Visit: Payer: Medicare HMO | Admitting: Family Medicine

## 2015-02-11 VITALS — BP 128/72 | HR 80 | Temp 98.4°F | Resp 20 | Ht 66.0 in | Wt 132.0 lb

## 2015-02-11 DIAGNOSIS — E039 Hypothyroidism, unspecified: Secondary | ICD-10-CM | POA: Diagnosis not present

## 2015-02-11 DIAGNOSIS — R252 Cramp and spasm: Secondary | ICD-10-CM

## 2015-02-11 DIAGNOSIS — J438 Other emphysema: Secondary | ICD-10-CM | POA: Diagnosis not present

## 2015-02-11 LAB — CBC WITH DIFFERENTIAL/PLATELET
Basophils Absolute: 0 10*3/uL (ref 0.0–0.1)
Basophils Relative: 0 % (ref 0–1)
Eosinophils Absolute: 0.3 10*3/uL (ref 0.0–0.7)
Eosinophils Relative: 5 % (ref 0–5)
HCT: 41.1 % (ref 39.0–52.0)
Hemoglobin: 13.5 g/dL (ref 13.0–17.0)
Lymphocytes Relative: 27 % (ref 12–46)
Lymphs Abs: 1.9 10*3/uL (ref 0.7–4.0)
MCH: 28.4 pg (ref 26.0–34.0)
MCHC: 32.8 g/dL (ref 30.0–36.0)
MCV: 86.5 fL (ref 78.0–100.0)
MPV: 9.3 fL (ref 8.6–12.4)
Monocytes Absolute: 0.8 10*3/uL (ref 0.1–1.0)
Monocytes Relative: 12 % (ref 3–12)
Neutro Abs: 3.9 10*3/uL (ref 1.7–7.7)
Neutrophils Relative %: 56 % (ref 43–77)
Platelets: 321 10*3/uL (ref 150–400)
RBC: 4.75 MIL/uL (ref 4.22–5.81)
RDW: 14.2 % (ref 11.5–15.5)
WBC: 6.9 10*3/uL (ref 4.0–10.5)

## 2015-02-11 LAB — COMPREHENSIVE METABOLIC PANEL
ALT: 20 U/L (ref 0–53)
AST: 22 U/L (ref 0–37)
Albumin: 4.1 g/dL (ref 3.5–5.2)
Alkaline Phosphatase: 62 U/L (ref 39–117)
BUN: 21 mg/dL (ref 6–23)
CO2: 26 mEq/L (ref 19–32)
Calcium: 9.5 mg/dL (ref 8.4–10.5)
Chloride: 103 mEq/L (ref 96–112)
Creat: 0.95 mg/dL (ref 0.50–1.35)
Glucose, Bld: 61 mg/dL — ABNORMAL LOW (ref 70–99)
Potassium: 4.4 mEq/L (ref 3.5–5.3)
Sodium: 139 mEq/L (ref 135–145)
Total Bilirubin: 0.4 mg/dL (ref 0.2–1.2)
Total Protein: 6.3 g/dL (ref 6.0–8.3)

## 2015-02-11 LAB — MAGNESIUM: Magnesium: 2.1 mg/dL (ref 1.5–2.5)

## 2015-02-11 LAB — TSH: TSH: 1.965 u[IU]/mL (ref 0.350–4.500)

## 2015-02-11 NOTE — Assessment & Plan Note (Signed)
Currently stable, reviewed pulmonary note Increase activity with Y MCA  Continue meds and oxygen therapy

## 2015-02-11 NOTE — Assessment & Plan Note (Signed)
Advised to increase fluids, cramps could be from hydration, he is off statin, will check lytes Okay to use magnesium once a day

## 2015-02-11 NOTE — Patient Instructions (Addendum)
We will call with la results YOu can take 1 magnesium 500mg  once a day  Increase water F/U 4 months

## 2015-02-11 NOTE — Progress Notes (Signed)
Patient ID: CY BRESEE, male   DOB: June 23, 1929, 81 y.o.   [name]: [id number]   Subjective:    Patient ID: PINCHUS WECKWERTH, male    DOB: 10-Mar-1929, 81 y.o.   [name]: [id number]  Patient presents for 4 month F/U; Mulscle Cramps; and Ear Irrigation  Patient here with leg cramps for the past few weeks. These actually started after he stopped his simvastatin as this was causing aching all over. He has not tried anything to remedy this at home. He does not drink very much water. He feels well otherwise. He does state that a nurse from his insurance company was out and advised him that he needs to have his ears cleaned out he tried cleaning them himself. He denies any change in his hearing. His vacations as well as his last pulmonary no reviewed   Review Of Systems:  GEN- denies fatigue, fever, weight loss,weakness, recent illness HEENT- denies eye drainage, change in vision, nasal discharge, CVS- denies chest pain, palpitations RESP- denies SOB, cough, wheeze ABD- denies N/V, change in stools, abd pain GU- denies dysuria, hematuria, dribbling, incontinence MSK- denies joint pain, +muscle aches, injury Neuro- denies headache, dizziness, syncope, seizure activity       Objective:    BP 128/72 mmHg  Pulse 80  Temp(Src) 98.4 F (36.9 C) (Oral)  Resp 20  Ht 5\' 6"  (1.676 m)  Wt 132 lb (59.875 kg)  BMI 21.32 kg/m2  SpO2 96% GEN- NAD, alert and oriented x3 HEENT- PERRL, EOMI, non injected sclera, pink conjunctiva, MMM, oropharynx clear, TM clear, canals clear no impaction Neck- Supple, no thyromegaly CVS- RRR, no murmur RESP-CTAB,decreased at bases ABD-NABS,soft,NT,ND EXT- No edema, neg homans, varicose veins Pulses- Radial,  2+        Assessment & Plan:      Problem List Items Addressed This Visit    Leg cramps    Advised to increase fluids, cramps could be from hydration, he is off statin, will check lytes Okay to use magnesium once a day       Relevant Orders   Comprehensive metabolic panel   CBC with Differential/Platelet   Magnesium   Hypothyroidism    Recheck TFT      Relevant Orders   TSH   COPD (chronic obstructive pulmonary disease) with emphysema - Primary    Currently stable, reviewed pulmonary note Increase activity with Y MCA  Continue meds and oxygen therapy         Note: This dictation was prepared with Dragon dictation along with smaller phrase technology. Any transcriptional errors that result from this process are unintentional.

## 2015-02-11 NOTE — Assessment & Plan Note (Signed)
Recheck TFT 

## 2015-03-03 ENCOUNTER — Telehealth: Payer: Self-pay | Admitting: Family Medicine

## 2015-03-03 MED ORDER — TRAMADOL HCL 50 MG PO TABS: 50.0000 mg | ORAL_TABLET | Freq: Three times a day (TID) | ORAL | Status: DC | PRN

## 2015-03-03 NOTE — Telephone Encounter (Signed)
Ok to refill??  Last office visit 02/11/2015.  Last refill 08/17/2014, #2 refills.

## 2015-03-03 NOTE — Telephone Encounter (Signed)
Medication called to pharmacy.  Call placed to patient and patient made aware.  

## 2015-03-03 NOTE — Telephone Encounter (Signed)
[phone] [facility] [place]  Pt is needing a refill on Tramadol

## 2015-03-03 NOTE — Telephone Encounter (Signed)
okay

## 2015-04-15 ENCOUNTER — Other Ambulatory Visit: Payer: Self-pay | Admitting: Family Medicine

## 2015-04-15 NOTE — Telephone Encounter (Signed)
LRF 10/13/14 #30 +3  LOV 02/11/15  OK refill?

## 2015-04-15 NOTE — Telephone Encounter (Signed)
LRF 10/13/14 #30 + 3  LOV 02/11/15  OK refill?

## 2015-04-15 NOTE — Telephone Encounter (Signed)
Okay to refill? 

## 2015-04-15 NOTE — Telephone Encounter (Signed)
Medication refilled per protocol. 

## 2015-04-25 ENCOUNTER — Other Ambulatory Visit: Payer: Self-pay | Admitting: Family Medicine

## 2015-04-25 NOTE — Telephone Encounter (Signed)
Refill appropriate and filled per protocol. 

## 2015-04-28 ENCOUNTER — Other Ambulatory Visit: Payer: Self-pay | Admitting: Family Medicine

## 2015-04-28 ENCOUNTER — Ambulatory Visit (INDEPENDENT_AMBULATORY_CARE_PROVIDER_SITE_OTHER): Payer: Medicare HMO | Admitting: Emergency Medicine

## 2015-04-28 ENCOUNTER — Encounter: Payer: Self-pay | Admitting: Emergency Medicine

## 2015-04-28 VITALS — BP 132/84 | HR 100

## 2015-04-28 DIAGNOSIS — J438 Other emphysema: Secondary | ICD-10-CM

## 2015-04-28 NOTE — Progress Notes (Signed)
  Acute Visit 12/02/14 --  HPI: 81 year old gentleman followed by Dr.Clance  for severe COPD with associated chronic hypoxemic respiratory failure. He was last seen by [facility] in December 2015. He developed URI sx about a week ago, but no fever, aches, GI sx. More recently he did have a fever and some aching in back. He has developed wheeze, cough, some decrease in his exertional tolerance. He is coughing up purulent mucous.  He has desaturated to mid 80's. His prn combivent use is stable at 1-2x a day. He has been on symbicort reliably.   ROV 04/28/15 -- follow-up visit for severe COPD, chronic hypoxemic respiratory failure. He has been managed on symbicort and combivent prn. Uses combivent about 1-2x a day. He has been stable since last visit with Dr Gwenette Greet. He has modified his activity some, but is able to exert. He stops to rest w stairs and hills. He usually wears his O2 with exertion, set on 2L/min. His last flare was March '16. Rare cough, occas wheeze. No CP, no edema.     CAT Score 04/28/2015 04/20/2014  Total CAT Score 14 7      Filed Vitals:   04/28/15 1332  BP: 132/84  Pulse: 100  SpO2: 96%   Gen: Pleasant, thin man, in no distress on O2,  normal affect  ENT: No lesions,  mouth clear,  oropharynx clear, no postnasal drip  Neck: No JVD, no TMG, no carotid bruits  Lungs: No use of accessory muscles, VERY distant, no wheeze  Cardiovascular: RRR, heart sounds normal, no murmur or gallops, no peripheral edema  Musculoskeletal: No deformities, no cyanosis or clubbing  Neuro: alert, non focal  Skin: Warm, no lesions or rashes   COPD (chronic obstructive pulmonary disease) with emphysema Mr. Manolis is stable at this time. His last exacerbation was in March. He does have some exertional limitation. I encouraged him to wear his oxygen with all exertion (which he doesn't always do). He will continue Symbicort twice a day and Combivent as needed. Follow in 6 months or sooner if  necessary

## 2015-04-28 NOTE — Assessment & Plan Note (Signed)
Mr. Steadham is stable at this time. His last exacerbation was in March. He does have some exertional limitation. I encouraged him to wear his oxygen with all exertion (which he doesn't always do). He will continue Symbicort twice a day and Combivent as needed. Follow in 6 months or sooner if necessary

## 2015-04-28 NOTE — Telephone Encounter (Signed)
Refill appropriate and filled per protocol. 

## 2015-04-28 NOTE — Patient Instructions (Addendum)
Please continue your Symbicort twice a day. Rinse and gargle after you use it.  Use combivent as needed for shortness of breath Wear your oxygen with exertion and while sleeping.  Follow with Dr Lamonte Sakai in 6 months or sooner if you have any problems

## 2015-06-17 ENCOUNTER — Encounter: Payer: Self-pay | Admitting: Family Medicine

## 2015-06-17 ENCOUNTER — Ambulatory Visit (INDEPENDENT_AMBULATORY_CARE_PROVIDER_SITE_OTHER): Payer: Medicare HMO | Admitting: Family Medicine

## 2015-06-17 VITALS — BP 122/68 | HR 84 | Temp 98.3°F | Resp 18 | Ht 66.0 in | Wt 131.0 lb

## 2015-06-17 DIAGNOSIS — Z8546 Personal history of malignant neoplasm of prostate: Secondary | ICD-10-CM

## 2015-06-17 DIAGNOSIS — E785 Hyperlipidemia, unspecified: Secondary | ICD-10-CM | POA: Diagnosis not present

## 2015-06-17 DIAGNOSIS — Z23 Encounter for immunization: Secondary | ICD-10-CM | POA: Diagnosis not present

## 2015-06-17 DIAGNOSIS — Z0000 Encounter for general adult medical examination without abnormal findings: Secondary | ICD-10-CM

## 2015-06-17 LAB — COMPREHENSIVE METABOLIC PANEL
ALT: 26 U/L (ref 9–46)
AST: 26 U/L (ref 10–35)
Albumin: 4.6 g/dL (ref 3.6–5.1)
Alkaline Phosphatase: 60 U/L (ref 40–115)
BUN: 18 mg/dL (ref 7–25)
CO2: 28 mmol/L (ref 20–31)
Calcium: 9.9 mg/dL (ref 8.6–10.3)
Chloride: 101 mmol/L (ref 98–110)
Creat: 0.88 mg/dL (ref 0.70–1.11)
Glucose, Bld: 90 mg/dL (ref 70–99)
Potassium: 4.3 mmol/L (ref 3.5–5.3)
Sodium: 136 mmol/L (ref 135–146)
Total Bilirubin: 0.7 mg/dL (ref 0.2–1.2)
Total Protein: 6.9 g/dL (ref 6.1–8.1)

## 2015-06-17 LAB — LIPID PANEL
Cholesterol: 252 mg/dL — ABNORMAL HIGH (ref 125–200)
HDL: 77 mg/dL (ref >=40)
LDL Cholesterol: 156 mg/dL — ABNORMAL HIGH (ref <130)
Total CHOL/HDL Ratio: 3.3 Ratio (ref <=5.0)
Triglycerides: 96 mg/dL (ref <150)
VLDL: 19 mg/dL (ref <30)

## 2015-06-17 LAB — CBC WITH DIFFERENTIAL/PLATELET
Basophils Absolute: 0 10*3/uL (ref 0.0–0.1)
Basophils Relative: 0 % (ref 0–1)
Eosinophils Absolute: 0.3 10*3/uL (ref 0.0–0.7)
Eosinophils Relative: 4 % (ref 0–5)
HCT: 42.9 % (ref 39.0–52.0)
Hemoglobin: 14.3 g/dL (ref 13.0–17.0)
Lymphocytes Relative: 27 % (ref 12–46)
Lymphs Abs: 1.8 10*3/uL (ref 0.7–4.0)
MCH: 28.5 pg (ref 26.0–34.0)
MCHC: 33.3 g/dL (ref 30.0–36.0)
MCV: 85.6 fL (ref 78.0–100.0)
MPV: 9.1 fL (ref 8.6–12.4)
Monocytes Absolute: 0.7 10*3/uL (ref 0.1–1.0)
Monocytes Relative: 11 % (ref 3–12)
Neutro Abs: 3.8 10*3/uL (ref 1.7–7.7)
Neutrophils Relative %: 58 % (ref 43–77)
Platelets: 305 10*3/uL (ref 150–400)
RBC: 5.01 MIL/uL (ref 4.22–5.81)
RDW: 14.8 % (ref 11.5–15.5)
WBC: 6.6 10*3/uL (ref 4.0–10.5)

## 2015-06-17 NOTE — Assessment & Plan Note (Signed)
Recheck lipids

## 2015-06-17 NOTE — Patient Instructions (Signed)
I recommend eye visit once a year I recommend dental visit every 6 months Goal is to  Exercise 30 minutes 5 days a week We will send a letter with lab results  Flu shot given F/U 4 months

## 2015-06-17 NOTE — Progress Notes (Signed)
Patient ID: John Black, male   DOB: 07/03/1929, 81 y.o.   [name]: [id number] Subjective:   Patient presents for Medicare Annual/Subsequent preventive examination.    Pt here for Wellness exam.   Medications reviewed  Reviewed last pulmonary note.  Prostate cancer history- no current symptoms due for PSA Review Past Medical/Family/Social: Per EMR    Risk Factors  Current exercise habits: walks Dietary issues discussed: None  Cardiac risk factors: Hyperlipidemia   Depression Screen  (Note: if answer to either of the following is "Yes", a more complete depression screening is indicated)  Over the past two weeks, have you felt down, depressed or hopeless? No Over the past two weeks, have you felt little interest or pleasure in doing things? No Have you lost interest or pleasure in daily life? No Do you often feel hopeless? No Do you cry easily over simple problems? No   Activities of Daily Living  In your present state of health, do you have any difficulty performing the following activities?:  Driving? No  Managing money? No  Feeding yourself? No  Getting from bed to chair? No  Climbing a flight of stairs? No  Preparing food and eating?: No  Bathing or showering? No  Getting dressed: No  Getting to the toilet? No  Using the toilet:No  Moving around from place to place: No  In the past year have you fallen or had a near fall?:No  Are you sexually active? No  Do you have more than one partner? No   Hearing Difficulties: No  Do you often ask people to speak up or repeat themselves? No  Do you experience ringing or noises in your ears? No Do you have difficulty understanding soft or whispered voices? No  Do you feel that you have a problem with memory? No Do you often misplace items? No  Do you feel safe at home? Yes  Cognitive Testing  Alert? Yes Normal Appearance?Yes  Oriented to person? Yes Place? Yes  Time? Yes  Recall of three objects? Yes  Can perform simple  calculations? Yes  Displays appropriate judgment?Yes  Can read the correct time from a watch face?Yes   List the Names of Other Physician/Practitioners you currently use:   Dr. Lamonte Sakai  Dr. Nevada Crane- derm  Urology   Screening Tests / Date Colonoscopy - UTD                    Zostavax - declines  Pneumonia- UTD  Influenza Vaccine - Due  Tetanus/tdap- unable due to fiances   ROS: GEN- denies fatigue, fever, weight loss,weakness, recent illness HEENT- denies eye drainage, change in vision, nasal discharge, CVS- denies chest pain, palpitations RESP- denies SOB, cough, wheeze ABD- denies N/V, change in stools, abd pain GU- denies dysuria, hematuria, dribbling, incontinence MSK- denies joint pain, muscle aches, injury Neuro- denies headache, dizziness, syncope, seizure activity   Physical:  GEN- NAD, alert and oriented x3 HEENT- PERRL, EOMI, non injected sclera, pink conjunctiva, MMM, oropharynx clear Neck- Supple, no thryomegaly CVS- RRR, no murmur RESP-decreased BS, 2 L oxygen  EXT- No edema Pulses- Radial, DP- 2+     Assessment:    Annual wellness medicare exam   Plan:    During the course of the visit the patient was educated and counseled about appropriate screening and preventive services including:    Flu shot given .  Screen NEG for depression.  Diet review for nutrition referral? Yes ____ Not Indicated __x__  Patient Instructions (  the written plan) was given to the patient.  Medicare Attestation  I have personally reviewed:  The patient's medical and social history  Their use of alcohol, tobacco or illicit drugs  Their current medications and supplements  The patient's functional ability including ADLs,fall risks, home safety risks, cognitive, and hearing and visual impairment  Diet and physical activities  Evidence for depression or mood disorders  The patient's weight, height, BMI, and visual acuity have been recorded in the chart. I have made referrals,  counseling, and provided education to the patient based on review of the above and I have provided the patient with a written personalized care plan for preventive services.

## 2015-06-17 NOTE — Assessment & Plan Note (Addendum)
Seen by urologist, no further PSA testing, had DRE which was normal

## 2015-06-20 ENCOUNTER — Other Ambulatory Visit: Payer: Self-pay | Admitting: *Deleted

## 2015-06-20 MED ORDER — PRAVASTATIN SODIUM 20 MG PO TABS: 20.0000 mg | ORAL_TABLET | ORAL | Status: DC

## 2015-08-17 ENCOUNTER — Telehealth: Payer: Self-pay | Admitting: Emergency Medicine

## 2015-08-17 NOTE — Telephone Encounter (Signed)
Form received and placed in RB look at. Please advise once done thanks

## 2015-08-18 NOTE — Telephone Encounter (Signed)
Form is completed. Pt is aware. He will come by and pick this up. Nothing further was needed.

## 2015-09-14 ENCOUNTER — Encounter: Payer: Self-pay | Admitting: Physician Assistant

## 2015-09-14 ENCOUNTER — Ambulatory Visit (INDEPENDENT_AMBULATORY_CARE_PROVIDER_SITE_OTHER): Payer: Medicare HMO | Admitting: Physician Assistant

## 2015-09-14 VITALS — BP 116/70 | HR 80 | Temp 98.1°F | Resp 20 | Wt 132.0 lb

## 2015-09-14 DIAGNOSIS — J988 Other specified respiratory disorders: Secondary | ICD-10-CM | POA: Diagnosis not present

## 2015-09-14 DIAGNOSIS — B9689 Other specified bacterial agents as the cause of diseases classified elsewhere: Principal | ICD-10-CM

## 2015-09-14 MED ORDER — AZITHROMYCIN 250 MG PO TABS: ORAL_TABLET | ORAL | Status: DC

## 2015-09-14 NOTE — Progress Notes (Signed)
Patient ID: John Black [name]: [id number], DOB: 1929-04-02, 81 y.o. Date of Encounter: 09/14/2015, 11:55 AM    Chief Complaint:  Chief Complaint  Patient presents with  . sick x 1 week    sneezing, cough, itchy eyes, congestion, night sweats     HPI: 81 y.o. year old white male thin, on Nasal Cannula oxygen, here with his wife.  He says that on Sunday, December 25 Christmas day he started feeling a little bit of symptoms. Says that on Monday, December 26 symptoms increased and it progressively worsened since then. Says that he feels that it is "mostly in his head "-- having a lot of runny nose and blowing his nose a lot. Is that he really isn't coughing very much at all but occasionally does have some phlegm. Says that he was feeling sweats just last night only. Has not checked with the thermometer and has not felt that he had fever otherwise. No sore throat.     Home Meds:   Outpatient Prescriptions Prior to Visit  Medication Sig Dispense Refill  . albuterol-ipratropium (COMBIVENT) 18-103 MCG/ACT inhaler Inhale 2 puffs into the lungs every 4 (four) hours as needed for wheezing or shortness of breath. 3 Inhaler 3  . budesonide-formoterol (SYMBICORT) 160-4.5 MCG/ACT inhaler Inhale 2 puffs into the lungs 2 (two) times daily. 1 Inhaler 11  . Calcium-Magnesium-Vitamin D (CITRACAL CALCIUM+D PO) Take 1 tablet by mouth every morning.     Marland Kitchen ibuprofen (ADVIL,MOTRIN) 400 MG tablet Take 400 mg by mouth every 6 (six) hours as needed for fever, headache or mild pain.     Marland Kitchen levothyroxine (SYNTHROID, LEVOTHROID) 75 MCG tablet TAKE 1 TABLET (75 MCG TOTAL) BY MOUTH DAILY. 90 tablet 3  . Melatonin 3 MG TABS Take 1-2 tablets by mouth at bedtime.    . Multiple Vitamin (MULTIVITAMIN WITH MINERALS) TABS tablet Take 1 tablet by mouth every morning.    . pravastatin (PRAVACHOL) 20 MG tablet Take 1 tablet (20 mg total) by mouth 3 (three) times a week. 30 tablet 0  . tamsulosin (FLOMAX) 0.4 MG  CAPS capsule Take 0.4 capsules by mouth daily.    . traMADol (ULTRAM) 50 MG tablet Take 1 tablet (50 mg total) by mouth every 8 (eight) hours as needed. 60 tablet 2  . zolpidem (AMBIEN) 10 MG tablet TAKE 1/2 TO 1 TABLET BY MOUTH AT BEDTIME AS NEEDED 30 tablet 3   No facility-administered medications prior to visit.    Allergies:  Allergies  Allergen Reactions  . Morphine     REACTION: sweats      Review of Systems: See HPI for pertinent ROS. All other ROS negative.    Physical Exam: Blood pressure 116/70, pulse 80, temperature 98.1 F (36.7 C), temperature source Oral, resp. rate 20, weight 132 lb (59.875 kg)., Body mass index is 21.32 kg/(m^2). General:  Thin white male on nasal cannula oxygen .Appears in no acute distress. HEENT: Normocephalic, atraumatic, eyes without discharge, sclera non-icteric, nares are without discharge. Bilateral auditory canals clear, TM's are without perforation, pearly grey and translucent with reflective cone of light bilaterally. Oral cavity moist, posterior pharynx without exudate, erythema, peritonsillar abscess. No tenderness with percussion of frontal and maxillary sinuses bilaterally.  Neck: Supple. No thyromegaly. No lymphadenopathy. Lungs:  He has distant breath sounds and decreased air movement but I hear no wheezes or rhonchi or rales. Heart: Regular rhythm. No murmurs, rubs, or gallops. Msk:  Strength and tone normal for age. Extremities/Skin: Warm and  dry.  Neuro: Alert and oriented X 3. Moves all extremities spontaneously. Gait is normal. CNII-XII grossly in tact. Psych:  Responds to questions appropriately with a normal affect.     ASSESSMENT AND PLAN:  81 y.o. year old male with  1. Bacterial respiratory infection He is to take antibiotic as directed. If symptoms worsen or if symptoms do not return to baseline within 1 week after completion of antibiotic, he is to follow-up with [place]. - azithromycin (ZITHROMAX) 250 MG tablet; Day 1:  Take 2 daily.  Days 2-5: Take 1 daily.  Dispense: 6 tablet; Refill: 0   Signed, [address] [place], [place], [facility] 09/14/2015 11:55 AM

## 2015-09-27 DIAGNOSIS — J439 Emphysema, unspecified: Secondary | ICD-10-CM | POA: Diagnosis not present

## 2015-10-10 ENCOUNTER — Other Ambulatory Visit: Payer: Self-pay | Admitting: Family Medicine

## 2015-10-10 NOTE — Telephone Encounter (Signed)
Medication called to pharmacy. 

## 2015-10-10 NOTE — Telephone Encounter (Signed)
Okay to refill? 

## 2015-10-10 NOTE — Telephone Encounter (Signed)
Ok to refill??  Last office visit 09/14/2015.  Last refill 03/03/2015, #2 refills.

## 2015-10-11 ENCOUNTER — Telehealth: Payer: Self-pay | Admitting: Family Medicine

## 2015-10-11 ENCOUNTER — Other Ambulatory Visit: Payer: Self-pay | Admitting: Family Medicine

## 2015-10-11 NOTE — Telephone Encounter (Signed)
Pt is calling for a refill of Tramadol.  [facility] # [phone]

## 2015-10-11 NOTE — Telephone Encounter (Signed)
Refill denied.   Prescription called in on 10/10/2015.

## 2015-10-11 NOTE — Telephone Encounter (Signed)
Medication has been called in on 10/10/2015.

## 2015-10-21 ENCOUNTER — Ambulatory Visit: Payer: Medicare HMO | Admitting: Family Medicine

## 2015-10-28 DIAGNOSIS — J439 Emphysema, unspecified: Secondary | ICD-10-CM | POA: Diagnosis not present

## 2015-11-04 ENCOUNTER — Ambulatory Visit (INDEPENDENT_AMBULATORY_CARE_PROVIDER_SITE_OTHER): Payer: PPO | Admitting: Family Medicine

## 2015-11-04 VITALS — BP 138/82 | HR 90 | Temp 98.7°F | Resp 20 | Ht 66.0 in | Wt 127.0 lb

## 2015-11-04 DIAGNOSIS — J438 Other emphysema: Secondary | ICD-10-CM

## 2015-11-04 DIAGNOSIS — G4700 Insomnia, unspecified: Secondary | ICD-10-CM | POA: Diagnosis not present

## 2015-11-04 DIAGNOSIS — E785 Hyperlipidemia, unspecified: Secondary | ICD-10-CM

## 2015-11-04 NOTE — Patient Instructions (Signed)
F/U 4 months- Fasting

## 2015-11-04 NOTE — Progress Notes (Signed)
Patient ID: John Black, male   DOB: 1929-03-23, 82 y.o.   [name]: [id number]    Subjective:    Patient ID: John Black, male    DOB: [birth year], 81 y.o.   [name]: [id number]  Patient presents for F/U  Pt here for F/U chronic medical probolems, last visit, started on pravastatin Three times a week for cholesterol.  He has not been taking this on a regular basis. His wife was recently hospitalized for small bowel obstruction he has been helping her to recover.   He states that he still has some labored breathing from time to time he does have follow-up with his pulmonologist at the end of the month.   He is still followed by urology/ dermatology Dr. Nevada Crane   Podiatry for nail maintenance    Review Of Systems:  GEN- denies fatigue, fever, weight loss,weakness, recent illness HEENT- denies eye drainage, change in vision, nasal discharge, CVS- denies chest pain, palpitations RESP-+ SOB, cough, wheeze ABD- denies N/V, change in stools, abd pain GU- denies dysuria, hematuria, dribbling, incontinence MSK- denies joint pain, muscle aches, injury Neuro- denies headache, dizziness, syncope, seizure activity       Objective:    BP 138/82 mmHg  Pulse 90  Temp(Src) 98.7 F (37.1 C) (Oral)  Resp 20  Ht 5\' 6"  (1.676 m)  Wt 127 lb (57.607 kg)  BMI 20.51 kg/m2  SpO2 94% GEN- NAD, alert and oriented x3 HEENT- PERRL, EOMI, non injected sclera, pink conjunctiva, MMM, oropharynx clear Neck- Supple, no thyromegaly CVS- RRR, no murmur RESP-occasional wheeze, normal WOB, 2L oxygen EXT- No edema Pulses- Radial, DP- 2+        Assessment & Plan:      Problem List Items Addressed This Visit    Mild hyperlipidemia    Recheck lipids at next visit Advise to be more consistent with TIW dosing      Insomnia    Continues to use Ambien, benefit outweighs risk       COPD (chronic obstructive pulmonary disease) with emphysema (HCC) - Primary    Continue current inhalers F/U with  pulmonary  Sats look okay on 2 liters, ambulating without difficulty         Note: This dictation was prepared with Dragon dictation along with smaller phrase technology. Any transcriptional errors that result from this process are unintentional.

## 2015-11-06 ENCOUNTER — Encounter: Payer: Self-pay | Admitting: Family Medicine

## 2015-11-06 NOTE — Assessment & Plan Note (Signed)
Continues to use Ambien, benefit outweighs risk

## 2015-11-06 NOTE — Assessment & Plan Note (Signed)
Continue current inhalers F/U with pulmonary  Sats look okay on 2 liters, ambulating without difficulty

## 2015-11-06 NOTE — Assessment & Plan Note (Signed)
Recheck lipids at next visit Advise to be more consistent with TIW dosing

## 2015-11-07 ENCOUNTER — Other Ambulatory Visit: Payer: Self-pay | Admitting: Family Medicine

## 2015-11-07 NOTE — Telephone Encounter (Signed)
RX called in .

## 2015-11-07 NOTE — Telephone Encounter (Signed)
Ok to refill??  Last office visit 11/04/2015.  Last refill 04/15/2015, #3 refills.

## 2015-11-07 NOTE — Telephone Encounter (Signed)
Okay to refill? 

## 2015-11-08 ENCOUNTER — Telehealth: Payer: Self-pay | Admitting: *Deleted

## 2015-11-08 ENCOUNTER — Telehealth: Payer: Self-pay | Admitting: Family Medicine

## 2015-11-08 ENCOUNTER — Encounter: Payer: Self-pay | Admitting: *Deleted

## 2015-11-08 MED ORDER — IPRATROPIUM-ALBUTEROL 18-103 MCG/ACT IN AERO
2.0000 | INHALATION_SPRAY | RESPIRATORY_TRACT | Status: DC | PRN
Start: 2015-11-08 — End: 2016-11-05

## 2015-11-08 NOTE — Telephone Encounter (Signed)
Patient would like to speak with you regarding his zolpidam, that he spoke to you about previously and his insurance [phone]

## 2015-11-08 NOTE — Telephone Encounter (Signed)
This encounter was created in error - please disregard.

## 2015-11-08 NOTE — Telephone Encounter (Signed)
Medication requires QL PA due to age.   PA submitted.

## 2015-11-08 NOTE — Telephone Encounter (Signed)
Received call from patient.   Requested refill on Combivent Inhaler for mail order.   Prescription printed and patient made aware to come to office to pick up.

## 2015-11-11 ENCOUNTER — Ambulatory Visit (INDEPENDENT_AMBULATORY_CARE_PROVIDER_SITE_OTHER): Payer: PPO | Admitting: Family Medicine

## 2015-11-11 VITALS — BP 130/72 | HR 100 | Temp 98.9°F | Resp 20 | Ht 66.0 in | Wt 126.0 lb

## 2015-11-11 DIAGNOSIS — J441 Chronic obstructive pulmonary disease with (acute) exacerbation: Secondary | ICD-10-CM

## 2015-11-11 MED ORDER — DOXYCYCLINE HYCLATE 100 MG PO TABS: 100.0000 mg | ORAL_TABLET | Freq: Two times a day (BID) | ORAL | Status: DC

## 2015-11-11 MED ORDER — METHYLPREDNISOLONE ACETATE 40 MG/ML IJ SUSP
40.0000 mg | Freq: Once | INTRAMUSCULAR | Status: AC
  Administered 2015-11-11: 40 mg via INTRAMUSCULAR

## 2015-11-11 MED ORDER — IPRATROPIUM-ALBUTEROL 0.5-2.5 (3) MG/3ML IN SOLN
3.0000 mL | Freq: Once | RESPIRATORY_TRACT | Status: AC
  Administered 2015-11-11: 3 mL via RESPIRATORY_TRACT

## 2015-11-11 MED ORDER — PREDNISONE 10 MG PO TABS: ORAL_TABLET | ORAL | Status: DC

## 2015-11-11 NOTE — Patient Instructions (Signed)
Start prednisone tomorrow Take antibiotics Use mucinex twice a day  Go to ER if you get worse this weekend

## 2015-11-11 NOTE — Progress Notes (Signed)
Patient ID: John Black, male   DOB: 12-Apr-1929, 82 y.o.   [name]: [id number]   Subjective:    Patient ID: John Black, male    DOB: Aug 02, 1929, 82 y.o.   [name]: [id number]  Patient presents for Illness   Patient here with cough with production chest tightness wheezing worse over the past 2 days. Positive sick contacts with some friends.he denies any fever no body aches no headache. He tried taking some Mucinex but this was actually expired from 2013. He's been using his Combivent as needed  He does have history of necrotizing pneumonia He is currently on 3 L of oxygen  But this is not continuous flow    Review Of Systems:  GEN- denies fatigue, fever, weight loss,weakness, recent illness HEENT- denies eye drainage, change in vision, nasal discharge, CVS- denies chest pain, palpitations RESP- + SOB, +cough,+ wheeze ABD- denies N/V, change in stools, abd pain Neuro- denies headache, dizziness, syncope, seizure activity       Objective:    BP 130/72 mmHg  Pulse 100  Temp(Src) 98.9 F (37.2 C) (Oral)  Resp 20  Ht 5\' 6"  (1.676 m)  Wt 126 lb (57.153 kg)  BMI 20.35 kg/m2  SpO2 90% GEN- NAD, alert and oriented x3 HEENT- PERRL, EOMI, non injected sclera, pink conjunctiva, MMM, oropharynx clear Neck- Supple, no thyromegaly CVS- RRR, no murmur RESP-occasional wheeze, normal WOB, 2L oxygen EXT- No edema Pulses- Radial, DP- 2+       Assessment & Plan:      Problem List Items Addressed This Visit    None    Visit Diagnoses    COPD exacerbation ([place])    -  Primary    Given Duoneb in office, improved WOB, will use continous Oxygen at night, continue given Depo Medrol IM, prednisone taper, doxycycline with previous PNA history    Relevant Medications    predniSONE (DELTASONE) 10 MG tablet    methylPREDNISolone acetate (DEPO-MEDROL) injection 40 mg (Completed)    ipratropium-albuterol (DUONEB) 0.5-2.5 (3) MG/3ML nebulizer solution 3 mL (Completed)       Note: This  dictation was prepared with Dragon dictation along with smaller phrase technology. Any transcriptional errors that result from this process are unintentional.

## 2015-11-15 ENCOUNTER — Ambulatory Visit (INDEPENDENT_AMBULATORY_CARE_PROVIDER_SITE_OTHER): Payer: PPO | Admitting: Emergency Medicine

## 2015-11-15 ENCOUNTER — Encounter: Payer: Self-pay | Admitting: *Deleted

## 2015-11-15 ENCOUNTER — Encounter: Payer: Self-pay | Admitting: Emergency Medicine

## 2015-11-15 VITALS — BP 128/76 | HR 105 | Ht 66.0 in | Wt 126.0 lb

## 2015-11-15 DIAGNOSIS — J438 Other emphysema: Secondary | ICD-10-CM | POA: Diagnosis not present

## 2015-11-15 NOTE — Assessment & Plan Note (Addendum)
With acute exacerbation in the setting of an upper respiratory infection. He is improving on doxycycline and prednisone, does not have wheeze on exam today. I do not believe he needs a chest x-ray given his interval improvement.  Please continue Symbicort twice a day for now Please continue Combivent as needed Continue prednisone and doxycycline as ordered Continue oxygen at all times Follow with Dr Lamonte Sakai in 2-3 months and we will discuss making a possible change in your everyday inhaler to an alternative

## 2015-11-15 NOTE — Progress Notes (Signed)
  Acute Visit 12/02/14 --  HPI: 82 year old gentleman followed by Dr.Clance  for severe COPD with associated chronic hypoxemic respiratory failure. He was last seen by [facility] in December 2015. He developed URI sx about a week ago, but no fever, aches, GI sx. More recently he did have a fever and some aching in back. He has developed wheeze, cough, some decrease in his exertional tolerance. He is coughing up purulent mucous.  He has desaturated to mid 80's. His prn combivent use is stable at 1-2x a day. He has been on symbicort reliably.   ROV 04/28/15 -- follow-up visit for severe COPD, chronic hypoxemic respiratory failure. He has been managed on symbicort and combivent prn. Uses combivent about 1-2x a day. He has been stable since last visit with Dr Gwenette Greet. He has modified his activity some, but is able to exert. He stops to rest w stairs and hills. He usually wears his O2 with exertion, set on 2L/min. His last flare was March '16. Rare cough, occas wheeze. No CP, no edema.   ROV 11/15/15 -- patient with a history of severe COPD, chronic hypoxemic respiratory failure,he has been treated with Symbicort twice a day and uses Combivent when necessary. He tells me that he developed URI sx a week ago, started to have an AE, was seen by Dr Buelah Manis, was started on pred + doxycycline. He is starting to feel better. The prednisone keeps him awake, but he is tapering.  m    CAT Score 04/28/2015 04/20/2014  Total CAT Score 14 7      Filed Vitals:   11/15/15 1435 11/15/15 1436  BP:  128/76  Pulse:  105  Height: 5\' 6"  (1.676 m)   Weight: 126 lb (57.153 kg)   SpO2:  90%   Gen: Pleasant, thin man, in no distress on O2,  normal affect  ENT: No lesions,  mouth clear,  oropharynx clear, no postnasal drip  Neck: No JVD, no TMG, no carotid bruits  Lungs: No use of accessory muscles, VERY distant, no wheeze  Cardiovascular: RRR, heart sounds normal, no murmur or gallops, no peripheral  edema  Musculoskeletal: No deformities, no cyanosis or clubbing  Neuro: alert, non focal  Skin: Warm, no lesions or rashes   COPD (chronic obstructive pulmonary disease) with emphysema With acute exacerbation in the setting of an upper respiratory infection. He is improving on doxycycline and prednisone, does not have wheeze on exam today. I do not believe he needs a chest x-ray given his interval improvement.  Please continue Symbicort twice a day for now Please continue Combivent as needed Continue prednisone and doxycycline as ordered Continue oxygen at all times Follow with Dr Lamonte Sakai in 2-3 months and we will discuss making a possible change in your everyday inhaler to an alternative   Baltazar Apo, MD, PhD 11/15/2015, 2:55 PM [facility] [phone] or if no answer [phone]

## 2015-11-15 NOTE — Patient Instructions (Signed)
Please continue Symbicort twice a day for now Please continue Combivent as needed Continue prednisone and doxycycline as ordered Continue oxygen at all times Follow with Dr Lamonte Sakai in 2-3 months and we will discuss making a possible change in your everyday inhaler to an alternative

## 2015-11-15 NOTE — Telephone Encounter (Signed)
Received PA determination.   PA denied.   Appeal faxed.

## 2015-11-21 NOTE — Telephone Encounter (Signed)
Received message from patient in regards to denial. Call placed to patient to inquire. [place].

## 2015-11-22 NOTE — Telephone Encounter (Signed)
Call placed to [facility] to follow up on PA.   Was advised that PA has been approved.   Call placed to pharmacy. Co-pay price is $8.33.   Call placed to patient. [place].

## 2015-11-25 ENCOUNTER — Ambulatory Visit (HOSPITAL_COMMUNITY)
Admission: RE | Admit: 2015-11-25 | Discharge: 2015-11-25 | Disposition: A | Discharge status: Home / Self Care | Payer: PPO | Source: Ambulatory Visit | Attending: Family Medicine | Admitting: Family Medicine

## 2015-11-25 ENCOUNTER — Other Ambulatory Visit: Payer: Self-pay | Admitting: Family Medicine

## 2015-11-25 ENCOUNTER — Ambulatory Visit (INDEPENDENT_AMBULATORY_CARE_PROVIDER_SITE_OTHER): Payer: PPO | Admitting: Family Medicine

## 2015-11-25 ENCOUNTER — Encounter: Payer: Self-pay | Admitting: Family Medicine

## 2015-11-25 ENCOUNTER — Telehealth: Payer: Self-pay | Admitting: Emergency Medicine

## 2015-11-25 VITALS — BP 120/60 | HR 68 | Temp 98.4°F | Resp 18 | Wt 128.0 lb

## 2015-11-25 DIAGNOSIS — R0602 Shortness of breath: Secondary | ICD-10-CM | POA: Diagnosis not present

## 2015-11-25 DIAGNOSIS — J439 Emphysema, unspecified: Secondary | ICD-10-CM | POA: Diagnosis not present

## 2015-11-25 DIAGNOSIS — J438 Other emphysema: Secondary | ICD-10-CM

## 2015-11-25 DIAGNOSIS — J449 Chronic obstructive pulmonary disease, unspecified: Secondary | ICD-10-CM | POA: Insufficient documentation

## 2015-11-25 DIAGNOSIS — R9389 Abnormal findings on diagnostic imaging of other specified body structures: Secondary | ICD-10-CM

## 2015-11-25 DIAGNOSIS — R05 Cough: Secondary | ICD-10-CM | POA: Diagnosis not present

## 2015-11-25 IMAGING — DX DG CHEST 2V
2 series · 2 of 2 positions shown · non-contrast
Comparison: PA and lateral chest x-ray [DATE] and
[DATE]

CLINICAL DATA: Three weeks of cough; symptoms persist despite
antibiotics and prednisone; shortness of breath and history of COPD.

EXAM:
CHEST  2 VIEW

[chest pa]
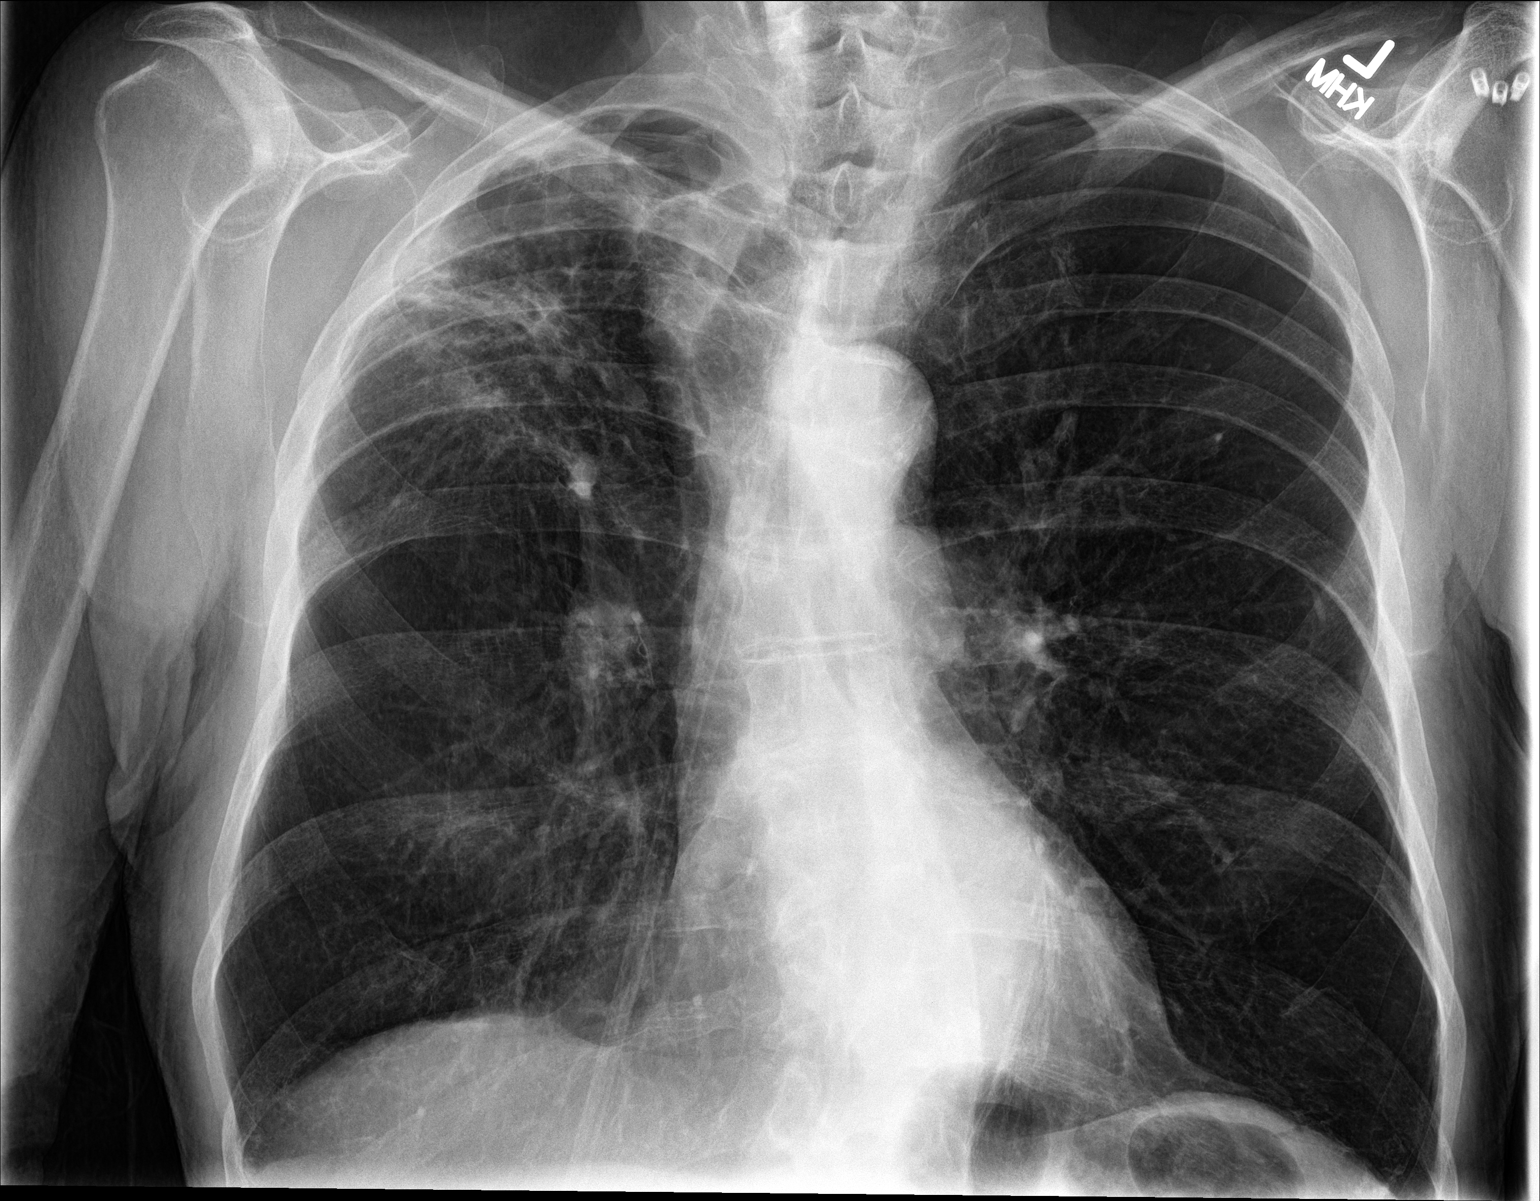

[chest lat]
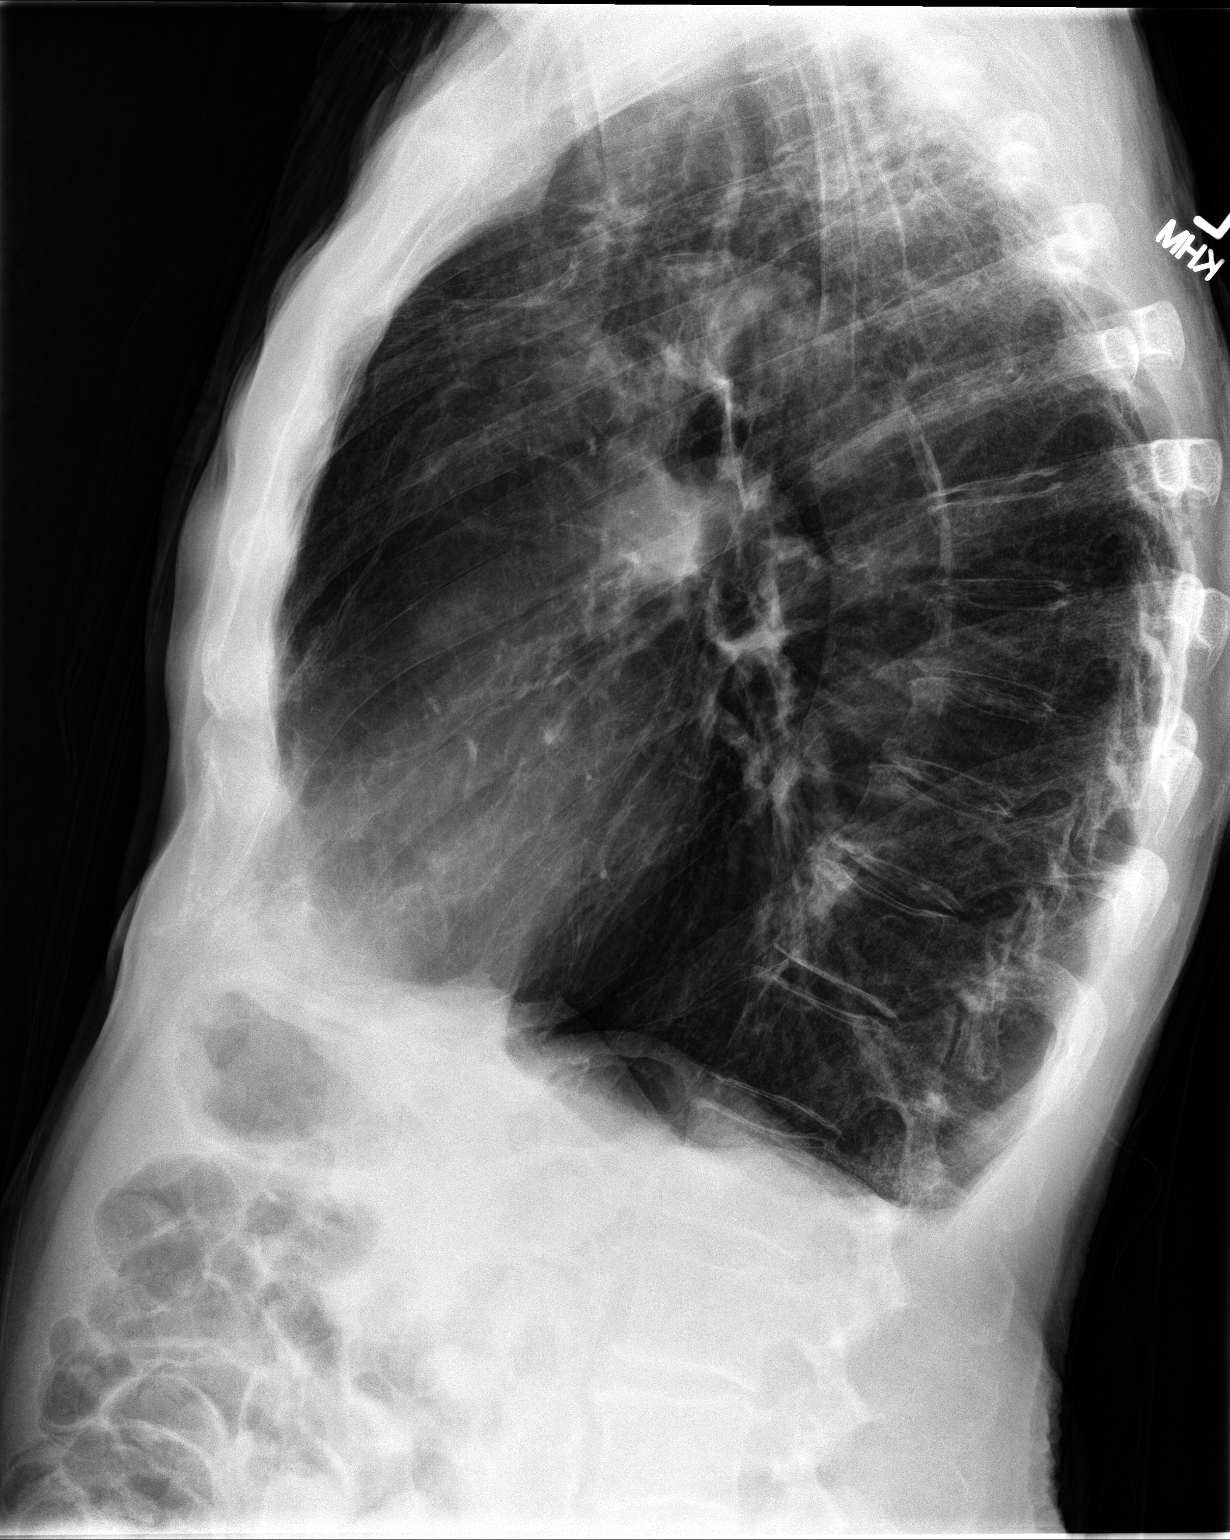

[2 of 2 positions shown; findings below may reference images not displayed]

FINDINGS: There are extensive chronic changes in the right upper lobe. Views
of much improved today over the [3H] in [3H] studies. Elsewhere the
right lung is clear. The left lung is clear. There is marked
hemidiaphragm flattening and increased AP dimension of the thorax.
The heart and pulmonary vascularity are normal. The mediastinum is
normal in width. There is gentle S shaped thoracic scoliosis which
is stable.
IMPRESSION: COPD with marked hyperinflation. Chronic parenchymal density in the
right upper lobe which has improved since previous studies. However
acute infectious processes superimposed on underlying abnormal lung
parenchyma is suspected. Given the lack of response to therapy,
chest CT scanning now is recommended.

These results will be called to the ordering clinician or
representative by the Radiologist Assistant, and communication
documented in the PACS or zVision Dashboard.

## 2015-11-25 MED ORDER — IPRATROPIUM-ALBUTEROL 0.5-2.5 (3) MG/3ML IN SOLN: 3.0000 mL | RESPIRATORY_TRACT | Status: DC | PRN

## 2015-11-25 MED ORDER — LEVOFLOXACIN 750 MG PO TABS: 750.0000 mg | ORAL_TABLET | Freq: Every day | ORAL | Status: DC

## 2015-11-25 NOTE — Assessment & Plan Note (Signed)
Plan obtain a chest x-ray as he still having symptoms from his previous exacerbation. I've also put him on DuoNeb since of the Combivent prescription given today. He will continue the Symbicort. There was some discussion about possibly adding Spiriva or one if he is a candidate for the new Bevespi

## 2015-11-25 NOTE — Patient Instructions (Addendum)
Take Duoneb in place of Combivent-   Use twice a day  Get chest xray  F/U as previous

## 2015-11-25 NOTE — Progress Notes (Signed)
Patient ID: John Black, male   DOB: October 18, 1928, 82 y.o.   [name]: [id number]   Subjective:    Patient ID: John Black, male    DOB: 07-10-1929, 82 y.o.   [name]: [id number]  Patient presents for Follow-up  She had a follow-up COPD/emphysema. He was seen a few weeks ago at that time prescribed prednisone and doxycycline. He did see his lung doctor and antrum P did not make any changes. He continues to have difficulty with cough and congestion wheeze worse at night. He wanted to see if he can try nebulizer set up his Combivent. He has not had any fever. He is still using his oxygen as prescribed. It was a discussion about changes inhalers to see if this also helped with this coughing and his shortness of breath but his pulmonologist wanted to wait  until he cleared this current infection.   Review Of Systems:  GEN- denies fatigue, fever, weight loss,weakness, recent illness HEENT- denies eye drainage, change in vision, nasal discharge, CVS- denies chest pain, palpitations RESP- + SOB,+ cough, +wheeze ABD- denies N/V, change in stools, abd pain Neuro- denies headache, dizziness, syncope, seizure activity       Objective:    BP 120/60 mmHg  Pulse 68  Temp(Src) 98.4 F (36.9 C) (Oral)  Resp 18  Wt 128 lb (58.06 kg)  SpO2 90% GEN- NAD, alert and oriented x3 HEENT- PERRL, EOMI, non injected sclera, pink conjunctiva, MMM, oropharynx clear Neck- Supple, CVS- RRR, no murmur RESP-mild congestion, good air movement, occasional wheeze, he is on intermittant portable oxygen, not continuous  Pulses- Radial, - 2+        Assessment & Plan:      Problem List Items Addressed This Visit    COPD (chronic obstructive pulmonary disease) with emphysema ([place]) - Primary    Plan obtain a chest x-ray as he still having symptoms from his previous exacerbation. I've also put him on DuoNeb since of the Combivent prescription given today. He will continue the Symbicort. There was some  discussion about possibly adding Spiriva or one if he is a candidate for the new Bevespi      Relevant Medications   ipratropium-albuterol (DUONEB) 0.5-2.5 (3) MG/3ML SOLN   Other Relevant Orders   DG Chest 2 View      Note: This dictation was prepared with Dragon dictation along with smaller phrase technology. Any transcriptional errors that result from this process are unintentional.

## 2015-11-25 NOTE — Telephone Encounter (Signed)
Pt PCP is Dr. Buelah Manis (in epic). Pt saw pcp 11/25/15 and had CXR done. Reports it showed some PNA. Pt started on ABX Levaquin that he started today. Pt is being scheduled for CT scan for possible next week. He wants Dr. Lamonte Sakai to know the above and if he thinks it is appropriate for the CT as well Pt aware RB will be in office on Monday afternoon. Please advise thanks

## 2015-11-28 ENCOUNTER — Telehealth: Payer: Self-pay | Admitting: Family Medicine

## 2015-11-28 MED ORDER — IPRATROPIUM BROMIDE 0.02 % IN SOLN: 0.5000 mg | Freq: Four times a day (QID) | RESPIRATORY_TRACT | Status: DC | PRN

## 2015-11-28 MED ORDER — ALBUTEROL SULFATE (2.5 MG/3ML) 0.083% IN NEBU: 2.5000 mg | INHALATION_SOLUTION | Freq: Four times a day (QID) | RESPIRATORY_TRACT | Status: DC | PRN

## 2015-11-28 NOTE — Telephone Encounter (Signed)
Yes I agree with repeating his CT scan

## 2015-11-28 NOTE — Telephone Encounter (Signed)
Patient is calling to ask some questions about the med that goes in his nebulizer please call him back at [phone]

## 2015-11-28 NOTE — Telephone Encounter (Signed)
Patient has CT scheduled for 3/17 at 10:15. Nothing further needed.

## 2015-11-28 NOTE — Telephone Encounter (Signed)
Patient seen in office on 3/10/201 and made aware.

## 2015-11-28 NOTE — Telephone Encounter (Signed)
Received return call from patient.   Reports that insurance will not cover DuoNeb. Out of pocket cost <$400.  New prescriptions sent to pharmacy to split DuoNeb to Albuterol and Ipratropium.

## 2015-11-28 NOTE — Telephone Encounter (Signed)
Call placed to patient. LMTRC.  

## 2015-12-01 ENCOUNTER — Ambulatory Visit (HOSPITAL_COMMUNITY): Payer: PPO

## 2015-12-02 ENCOUNTER — Ambulatory Visit (HOSPITAL_COMMUNITY)
Admission: RE | Admit: 2015-12-02 | Discharge: 2015-12-02 | Disposition: A | Discharge status: Home / Self Care | Payer: PPO | Source: Ambulatory Visit | Attending: Family Medicine | Admitting: Family Medicine

## 2015-12-02 DIAGNOSIS — I2510 Atherosclerotic heart disease of native coronary artery without angina pectoris: Secondary | ICD-10-CM | POA: Insufficient documentation

## 2015-12-02 DIAGNOSIS — J439 Emphysema, unspecified: Secondary | ICD-10-CM | POA: Diagnosis not present

## 2015-12-02 DIAGNOSIS — J189 Pneumonia, unspecified organism: Secondary | ICD-10-CM | POA: Diagnosis not present

## 2015-12-02 DIAGNOSIS — R9389 Abnormal findings on diagnostic imaging of other specified body structures: Secondary | ICD-10-CM

## 2015-12-02 DIAGNOSIS — R918 Other nonspecific abnormal finding of lung field: Secondary | ICD-10-CM | POA: Diagnosis not present

## 2015-12-02 DIAGNOSIS — R938 Abnormal findings on diagnostic imaging of other specified body structures: Secondary | ICD-10-CM | POA: Diagnosis not present

## 2015-12-02 IMAGING — CT CT CHEST W/O CM
2 of 3 series · 15 of 36 positions shown, 18 images · non-contrast
Comparison: CT chest [DATE].

CLINICAL DATA: History of COPD. Abnormal right upper lobe density.
Subsequent encounter.

EXAM:
CT CHEST WITHOUT CONTRAST
TECHNIQUE: Multidetector CT imaging of the chest was performed following the
standard protocol without IV contrast.

[Series 2: chestroutine 5.0 b40f · axial · 0.69mm/px · z∈[-280,-0]mm · 12 of 66 slices shown, 15 images]
[im 5/66  mediastinal]
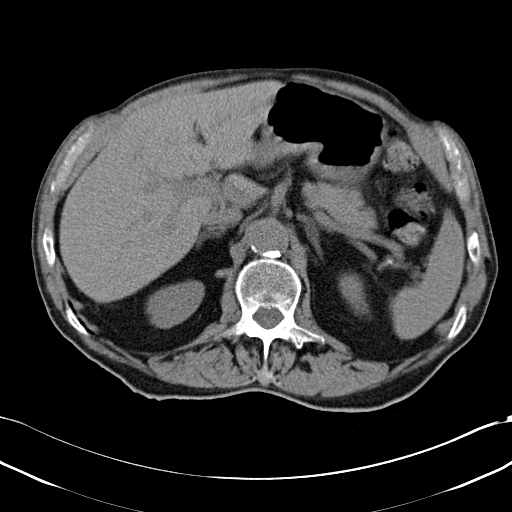
[im 5/66  lung]
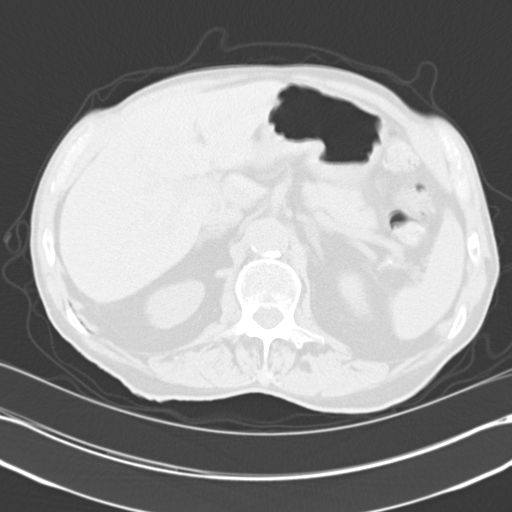
[im 10/66  lung]
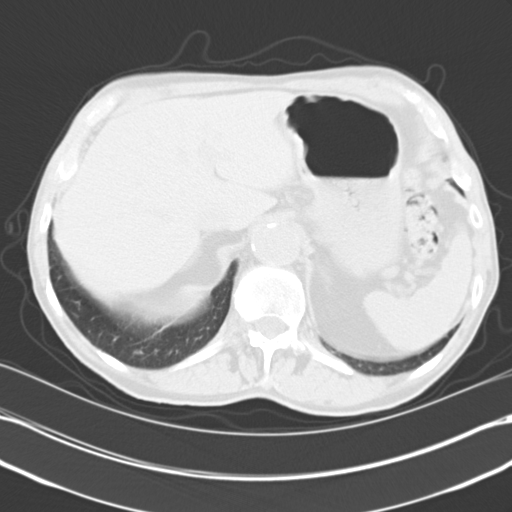
[im 15/66  lung]
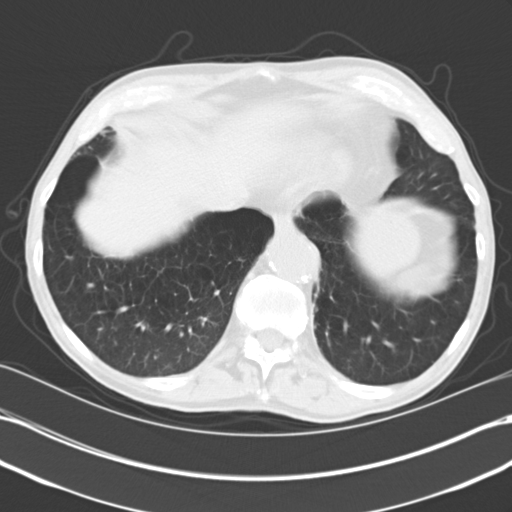
[im 20/66  lung]
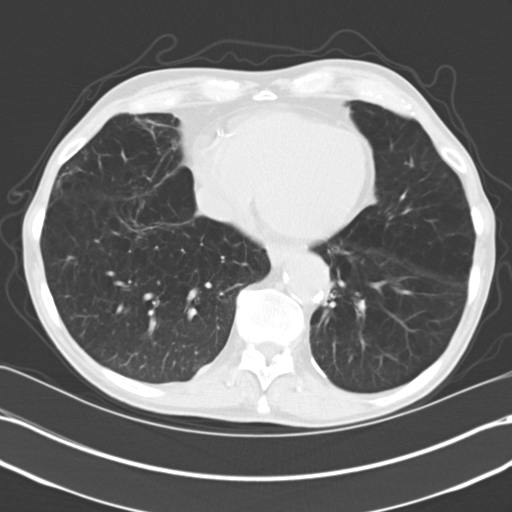
[im 25/66  mediastinal]
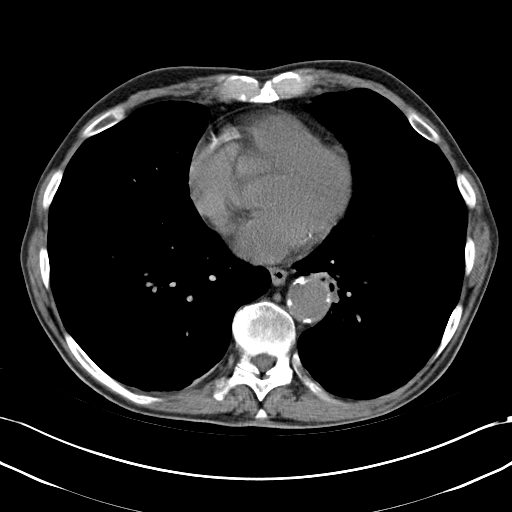
[im 25/66  lung]
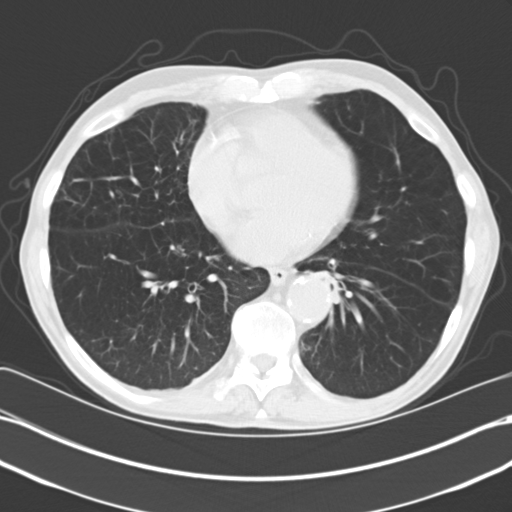
[im 29/66  lung]
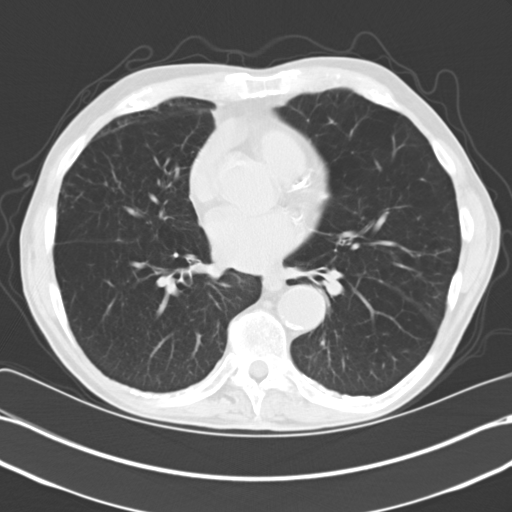
[im 37/66  lung]
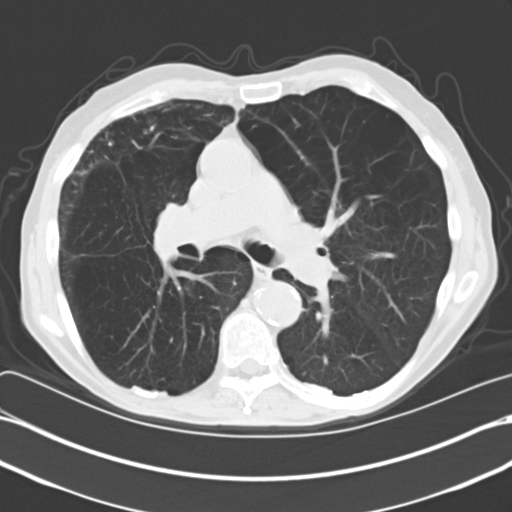
[im 41/66  lung]
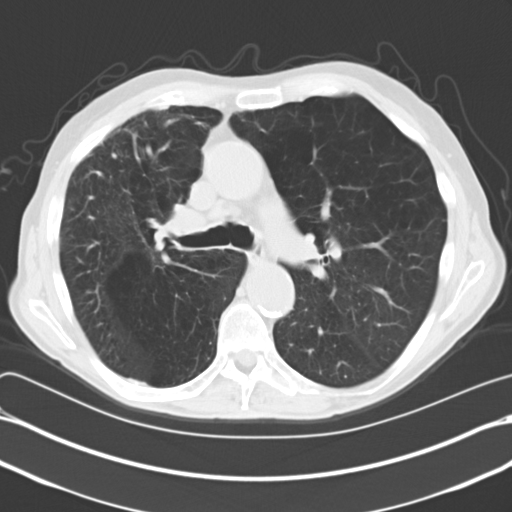
[im 46/66  mediastinal]
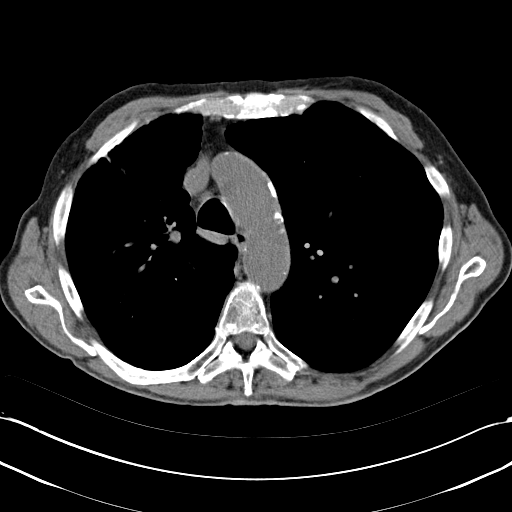
[im 46/66  lung]
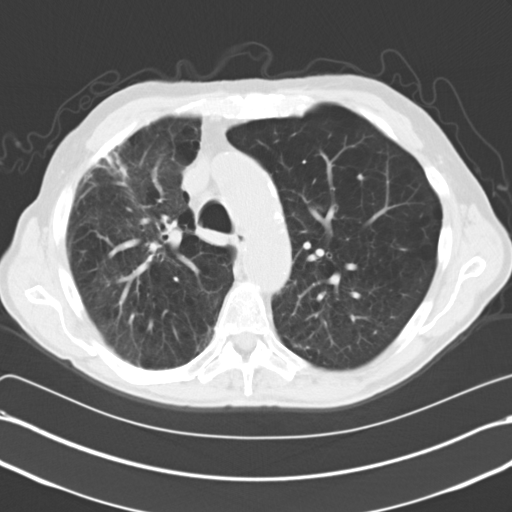
[im 51/66  lung]
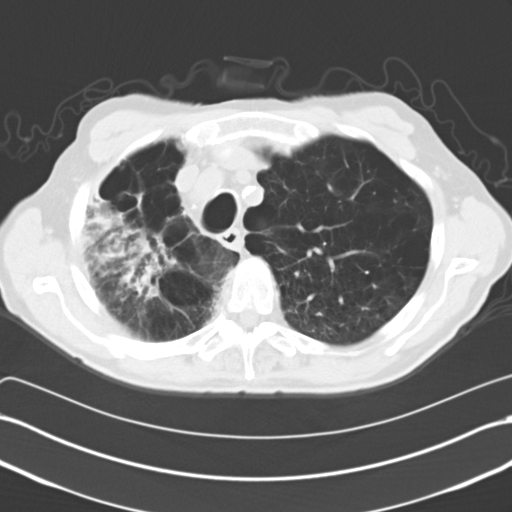
[im 56/66  lung]
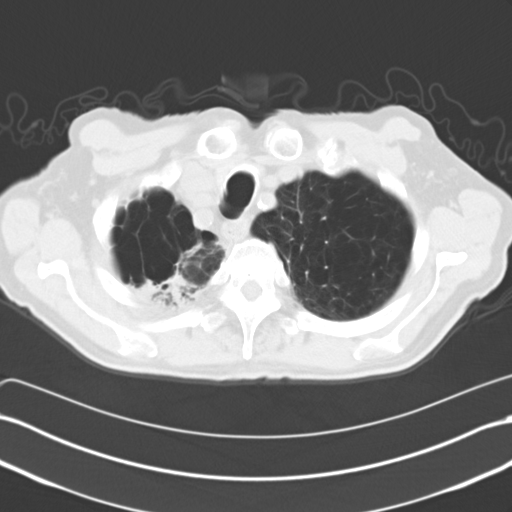
[im 61/66  lung]
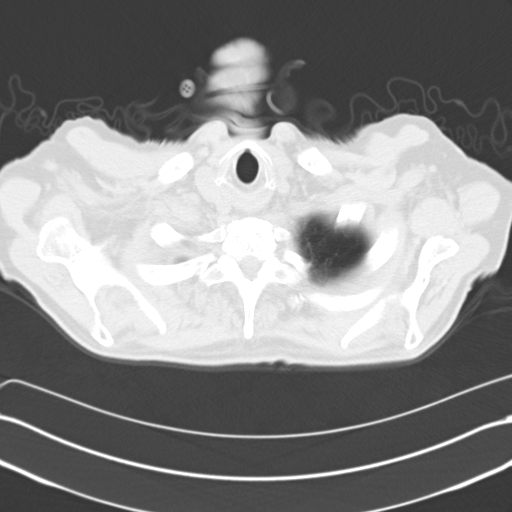

[Series 5: mpr coro 3mm · coronal · 0.64mm/px · 3 of 83 slices shown]
[im 17/83  lung]
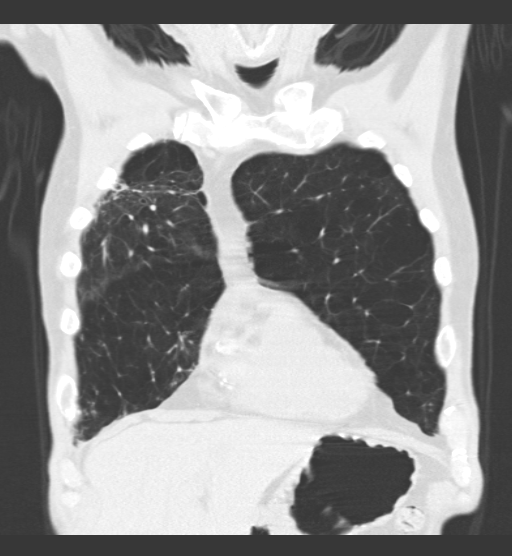
[im 33/83  lung]
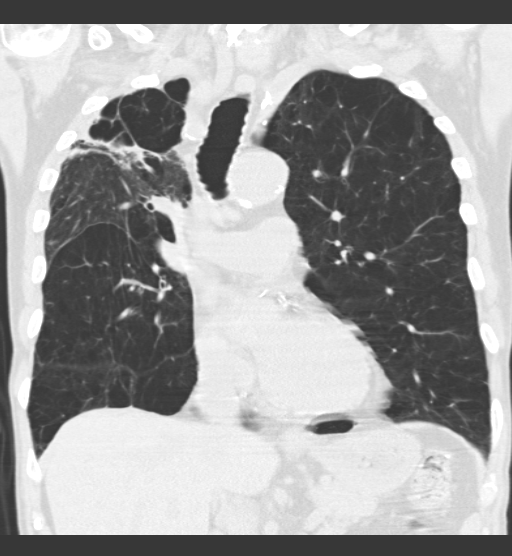
[im 50/83  lung]
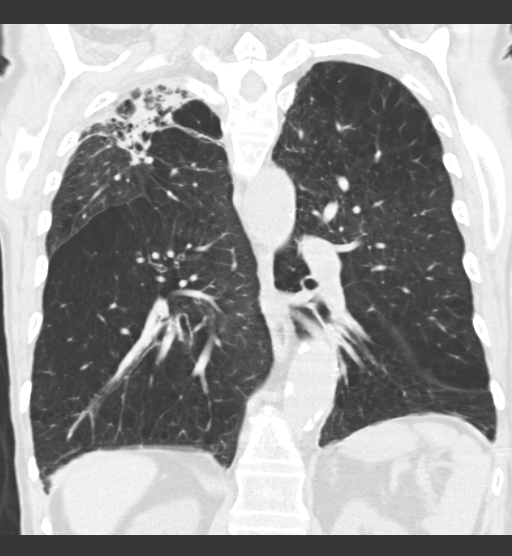

[15 of 36 positions shown; findings below may reference images not displayed]

Plain films of the chest
[DATE] and [DATE]. Single-view of the chest [DATE] and
[DATE].
FINDINGS: Calcified pleural plaques are seen bilaterally. No pleural or
pericardial effusion. Heart size is normal. Calcific aortic and
coronary atherosclerosis noted. No axillary, hilar are mediastinal
lymphadenopathy. The lungs are markedly emphysematous. Streaky upper
lobe opacity on the right is seen most consistent with scar related
to dense pneumonia which was present in this location on the prior
exams. Mild thickening of the left minor fissure is incidentally
noted. The lungs are otherwise clear. Incidentally imaged upper
abdomen is unremarkable.
IMPRESSION: Right upper lobe opacity seen on recent plain film of the chest is
most consistent with residual scarring related to dense pneumonia in
this location seen on prior exams.

Severe emphysema.

Calcific aortic and coronary atherosclerosis.

## 2015-12-09 ENCOUNTER — Encounter: Payer: Self-pay | Admitting: Family Medicine

## 2015-12-09 ENCOUNTER — Ambulatory Visit (INDEPENDENT_AMBULATORY_CARE_PROVIDER_SITE_OTHER): Payer: PPO | Admitting: Family Medicine

## 2015-12-09 ENCOUNTER — Telehealth: Payer: Self-pay | Admitting: *Deleted

## 2015-12-09 VITALS — BP 128/68 | HR 72 | Temp 98.4°F | Resp 18 | Ht 66.0 in | Wt 128.0 lb

## 2015-12-09 DIAGNOSIS — K4030 Unilateral inguinal hernia, with obstruction, without gangrene, not specified as recurrent: Secondary | ICD-10-CM

## 2015-12-09 NOTE — Progress Notes (Signed)
Patient ID: John Black, male   DOB: Jan 15, 1929, 82 y.o.   [name]: [id number]    Subjective:    Patient ID: John Black, male    DOB: 1929-07-29, 82 y.o.   [name]: [id number]  Patient presents for Hernia F/U Patient here with worsening of his right inguinal hernia. He has small hernia that I noted about a year and a half ago. Over the past 3-4 months he has noticed an increase in size. He has had bouts of bronchitis and pneumonia in which he coughs this is causing hernia to protrude more and occasionally have some pain. He states that he used to be able to reduce the hernia that he no longer can.    Review Of Systems:  GEN- denies fatigue, fever, weight loss,weakness, recent illness HEENT- denies eye drainage, change in vision, nasal discharge, CVS- denies chest pain, palpitations RESP- denies SOB, cough, wheeze ABD- denies N/V, change in stools, abd pain GU- denies dysuria, hematuria, dribbling, incontinence MSK- denies joint pain, muscle aches, injury Neuro- denies headache, dizziness, syncope, seizure activity       Objective:    BP 128/68 mmHg  Pulse 72  Temp(Src) 98.4 F (36.9 C) (Oral)  Resp 18  Ht 5\' 6"  (1.676 m)  Wt 128 lb (58.06 kg)  BMI 20.67 kg/m2 GEN- NAD, alert and oriented x3 CVS- RRR, no murmur RESP-CTAB, good air movement  ABD-NABS,soft,NT,ND Gu- large right inguinal hernia, unable to reduce compleley, NT          Assessment & Plan:      Problem List Items Addressed This Visit    Inguinal hernia - Primary      Note: This dictation was prepared with Dragon dictation along with smaller phrase technology. Any transcriptional errors that result from this process are unintentional.

## 2015-12-09 NOTE — Telephone Encounter (Signed)
Submitted [employer] referral thru acuity connect for authorization on 11/28/15 for CT with authorization [id number]  Requesting provider: Lonell Grandchild [place],MD  Treating provider: [facility]  Number of visits: 1  Start Date: 11/28/15  End Date: 05/26/16  Dx: R93.8- abnormal findings on diagnostic imaging of body structures  [facility] of [facility]  [facility]  Type of service: CT

## 2015-12-09 NOTE — Patient Instructions (Signed)
Referral for hernia surgery  F/U as previous

## 2015-12-09 NOTE — Assessment & Plan Note (Signed)
Enlarging inguinal hernia. I think that this is going to need repair however he is high-risk because of his COPD/emphysema. He will need monitoring during the procedure. His pulmonologist should also be contacted before undergoing the procedure. I went refer him to general surgery for their recommendations regarding repair.

## 2015-12-15 ENCOUNTER — Telehealth: Payer: Self-pay | Admitting: *Deleted

## 2015-12-15 NOTE — Telephone Encounter (Signed)
Submitted [employer] referral thru acuity connect for authorization to Dr. Anne Hahn Ramirez,MD with authorization [phone]  Requesting provider: Neysa Hotter  Treating provider: Anne Hahn Ramirez,MD  Number of visits:6  Start Date: 12/21/15  End Date:06/18/16  Dx: [id number]- Unil inquinal hernia, w obst, w/o gangrene, not spcf as recur

## 2015-12-21 ENCOUNTER — Telehealth: Payer: Self-pay | Admitting: Emergency Medicine

## 2015-12-21 DIAGNOSIS — K4090 Unilateral inguinal hernia, without obstruction or gangrene, not specified as recurrent: Secondary | ICD-10-CM | POA: Diagnosis not present

## 2015-12-21 MED ORDER — BUDESONIDE-FORMOTEROL FUMARATE 160-4.5 MCG/ACT IN AERO: 2.0000 | INHALATION_SPRAY | Freq: Two times a day (BID) | RESPIRATORY_TRACT | Status: DC

## 2015-12-21 NOTE — Telephone Encounter (Signed)
Last ov on 11/15/15 with RB Patient Instructions     Please continue Symbicort twice a day for now Please continue Combivent as needed Continue prednisone and doxycycline as ordered Continue oxygen at all times Follow with Dr Lamonte Sakai in 2-3 months and we will discuss making a possible change in your everyday inhaler to an alternative   Called and spoke with pt. He states that he needs a rx sent into the pharmacy for symbicort. [facility] [place]. I explained to him that I would send in the rx now. He voiced understanding and had no further questions. RX sent. Nothing further needed.

## 2015-12-26 ENCOUNTER — Telehealth: Payer: Self-pay | Admitting: Emergency Medicine

## 2015-12-26 DIAGNOSIS — J438 Other emphysema: Secondary | ICD-10-CM

## 2015-12-26 DIAGNOSIS — J439 Emphysema, unspecified: Secondary | ICD-10-CM | POA: Diagnosis not present

## 2015-12-26 NOTE — Telephone Encounter (Signed)
Spoke with pt.  Pt states he was advised by insurance they are needing a new rx for o2 from [facility] as previous o2 rx was from Dr. Gwenette Greet.  Pt states [employer] advised they would work with our office to obtain exactly what is needed.  Advised would call [employer] to see exactly what is needed and would update pt on status.    [facility] [employer] at [phone].  Was on hold > 10 minutes with no assistance.  WCB.

## 2015-12-27 NOTE — Telephone Encounter (Signed)
Spoke with pt. Advised him that we would send a new prescription to [facility] for his oxygen. Order has been placed. Nothing further was needed.

## 2016-01-25 DIAGNOSIS — J439 Emphysema, unspecified: Secondary | ICD-10-CM | POA: Diagnosis not present

## 2016-01-31 ENCOUNTER — Encounter: Payer: Self-pay | Admitting: Family Medicine

## 2016-01-31 ENCOUNTER — Ambulatory Visit (INDEPENDENT_AMBULATORY_CARE_PROVIDER_SITE_OTHER): Payer: PPO | Admitting: Family Medicine

## 2016-01-31 VITALS — BP 128/78 | HR 78 | Temp 98.1°F | Resp 14 | Ht 66.0 in | Wt 124.0 lb

## 2016-01-31 DIAGNOSIS — L089 Local infection of the skin and subcutaneous tissue, unspecified: Secondary | ICD-10-CM | POA: Diagnosis not present

## 2016-01-31 MED ORDER — CEPHALEXIN 500 MG PO CAPS: 500.0000 mg | ORAL_CAPSULE | Freq: Two times a day (BID) | ORAL | Status: DC

## 2016-01-31 NOTE — Patient Instructions (Signed)
Take antibiotics Continue soaks  F/U as previous

## 2016-01-31 NOTE — Progress Notes (Signed)
Patient ID: John Black, male   DOB: [birth year], 82 y.o.   [name]: [id number]   Subjective:    Patient ID: John Black, male    DOB: 04-05-1929, 82 y.o.   [name]: [id number]  Patient presents for Infection here with swelling and redness of his left great toe. He had a podiatry visit a couple weeks ago hit nail trimming done about a week later he noticed some redness and swelling around the nail fold on the lateral edge proximal nailbed. This weekend he had some yellow drainage from the lateral edge to use some Neosporin and Epsom salts aches. He has minimal discomfort no fever no leg pain.     Review Of Systems:  GEN- denies fatigue, fever, weight loss,weakness, recent illness HEENT- denies eye drainage, change in vision, nasal discharge, CVS- denies chest pain, palpitations RESP- denies SOB, cough, wheeze MSK- denies joint pain, muscle aches, injury Neuro- denies headache, dizziness, syncope, seizure activity       Objective:    BP 128/78 mmHg  Pulse 78  Temp(Src) 98.1 F (36.7 C) (Oral)  Resp 14  Ht 5\' 6"  (1.676 m)  Wt 124 lb (56.246 kg)  BMI 20.02 kg/m2 GEN- NAD, alert and oriented x3 CVS- RRR, no murmur RESP-CTAB, no wheeze  EXT- No edema,varivose veins Skin- Left great toe- erythema and swelling of toe lateral edge and proximal nail fold , no drainage noted, very thickened nails bilat chronic fungus, mild TTP area of swelling  Pulses- DP present bilat         Assessment & Plan:      Problem List Items Addressed This Visit    None    Visit Diagnoses    Infection of skin of toes    -  Primary    S/P manipulation now with infection of nail folds, chronic ingrown nail as well but so thick likley can not be removed. Keflex, epson salt soaks    Relevant Medications    cephALEXin (KEFLEX) 500 MG capsule       Note: This dictation was prepared with Dragon dictation along with smaller phrase technology. Any transcriptional errors that result from this  process are unintentional.

## 2016-02-14 ENCOUNTER — Encounter: Payer: Self-pay | Admitting: Emergency Medicine

## 2016-02-14 ENCOUNTER — Ambulatory Visit (INDEPENDENT_AMBULATORY_CARE_PROVIDER_SITE_OTHER): Payer: PPO | Admitting: Emergency Medicine

## 2016-02-14 VITALS — BP 144/80 | HR 94 | Ht 66.0 in | Wt 127.0 lb

## 2016-02-14 DIAGNOSIS — J438 Other emphysema: Secondary | ICD-10-CM

## 2016-02-14 NOTE — Patient Instructions (Addendum)
Please stop your symbicort for now. We will start Stiolto 2 puffs daily to see if you benefit.  Keep combivent available to use 2 puffs as needed for shortness of breath.  Stop atrovent nebulizer for now.  Follow with Dr Lamonte Sakai or NP in 4-6 weeks to determine whether you have improved on the stiolto.

## 2016-02-14 NOTE — Progress Notes (Signed)
Patient seen in the office today and instructed on use of Stiolto.  Patient expressed understanding and demonstrated technique. Desmond Dike, CMA ([place])

## 2016-02-14 NOTE — Assessment & Plan Note (Signed)
Please stop your symbicort for now. We will start Stiolto 2 puffs daily to see if you benefit.  Keep combivent available to use 2 puffs as needed for shortness of breath.  Stop atrovent nebulizer for now.  Follow with Dr Lamonte Sakai or NP in 4-6 weeks to determine whether you have improved on the stiolto.

## 2016-02-14 NOTE — Progress Notes (Signed)
Acute Visit 12/02/14 --  HPI: 82 year old gentleman followed by Dr.Clance  for severe COPD with associated chronic hypoxemic respiratory failure. He was last seen by [facility] in December 2015. He developed URI sx about a week ago, but no fever, aches, GI sx. More recently he did have a fever and some aching in back. He has developed wheeze, cough, some decrease in his exertional tolerance. He is coughing up purulent mucous.  He has desaturated to mid 80's. His prn combivent use is stable at 1-2x a day. He has been on symbicort reliably.   ROV 04/28/15 -- follow-up visit for severe COPD, chronic hypoxemic respiratory failure. He has been managed on symbicort and combivent prn. Uses combivent about 1-2x a day. He has been stable since last visit with Dr Gwenette Greet. He has modified his activity some, but is able to exert. He stops to rest w stairs and hills. He usually wears his O2 with exertion, set on 2L/min. His last flare was March '16. Rare cough, occas wheeze. No CP, no edema.   ROV 11/15/15 -- patient with a history of severe COPD, chronic hypoxemic respiratory failure,he has been treated with Symbicort twice a day and uses Combivent when necessary. He tells me that he developed URI sx a week ago, started to have an AE, was seen by Dr Buelah Manis, was started on pred + doxycycline. He is starting to feel better. The prednisone keeps him awake, but he is tapering.    ROV 02/14/16 -- patient with history of severe COPD, chronic hypoxemic respiratory failure. Currently managed on Symbicort twice a day, Combivent when necessary, about 2x a day. he has been on Spiriva in the past but did not benefit from this.  He believes that his breathing is a bit worse than last time. More exertional SOB. His allergies are fairly well controlled w loratadine - he does sneeze some. He does not hear wheeze. No cough. He has to stop to rest after walking through the house or up 15 stairs. His o2 is set at 2l/min pulsed. He was started  on atrovent nebs prn about 2 months ago, uses prn. His wife notes that he has had some aspiration of food/drink occasionally.     CAT Score 04/28/2015 04/20/2014  Total CAT Score 14 7      Filed Vitals:   02/14/16 1135 02/14/16 1136  BP:  144/80  Pulse:  94  Height: 5\' 6"  (1.676 m)   Weight: 127 lb (57.607 kg)   SpO2:  92%   Gen: Pleasant, thin man, in no distress on O2,  normal affect  ENT: No lesions,  mouth clear,  oropharynx clear, no postnasal drip  Neck: No JVD, no TMG, no carotid bruits  Lungs: No use of accessory muscles, VERY distant, no wheeze  Cardiovascular: RRR, heart sounds normal, no murmur or gallops, no peripheral edema  Musculoskeletal: No deformities, no cyanosis or clubbing  Neuro: alert, non focal  Skin: Warm, no lesions or rashes   COPD (chronic obstructive pulmonary disease) with emphysema Please stop your symbicort for now. We will start Stiolto 2 puffs daily to see if you benefit.  Keep combivent available to use 2 puffs as needed for shortness of breath.  Stop atrovent nebulizer for now.  Follow with Dr Lamonte Sakai or NP in 4-6 weeks to determine whether you have improved on the stiolto.      Baltazar Apo, MD, PhD 02/14/2016, 12:13 PM [facility] [phone] or if no answer [phone]

## 2016-02-20 ENCOUNTER — Ambulatory Visit (INDEPENDENT_AMBULATORY_CARE_PROVIDER_SITE_OTHER): Payer: PPO | Admitting: Family Medicine

## 2016-02-20 VITALS — BP 108/70 | HR 98 | Temp 98.5°F | Resp 20 | Ht 66.0 in | Wt 128.0 lb

## 2016-02-20 DIAGNOSIS — E039 Hypothyroidism, unspecified: Secondary | ICD-10-CM | POA: Diagnosis not present

## 2016-02-20 DIAGNOSIS — J9611 Chronic respiratory failure with hypoxia: Secondary | ICD-10-CM

## 2016-02-20 DIAGNOSIS — N400 Enlarged prostate without lower urinary tract symptoms: Secondary | ICD-10-CM

## 2016-02-20 DIAGNOSIS — J438 Other emphysema: Secondary | ICD-10-CM

## 2016-02-20 MED ORDER — POLYETHYLENE GLYCOL 3350 17 GM/SCOOP PO POWD: 17.0000 g | Freq: Two times a day (BID) | ORAL | Status: DC | PRN

## 2016-02-20 NOTE — Progress Notes (Signed)
Patient ID: John Black, male   DOB: [birth year], 82 y.o.   [name]: [id number]    Subjective:    Patient ID: John Black, male    DOB: 03-24-1929, 82 y.o.   [name]: [id number]  Patient presents for F/U Patient follow-up chronic medical problems. He was here a few weeks ago secondary to infection on his toe that we treated with antibiotics. That is now cleared.  COPD and reviewed his last pulmonary note in general he is doing fairly well. Now on Stioloto for past week   Hypothyroidism he is currently on Synthroid 75 g he is due for repeat TSH    Review Of Systems:  GEN- denies fatigue, fever, weight loss,weakness, recent illness HEENT- denies eye drainage, change in vision, nasal discharge, CVS- denies chest pain, palpitations RESP- denies SOB, cough, wheeze ABD- denies N/V, change in stools, abd pain GU- denies dysuria, hematuria, dribbling, incontinence MSK- denies joint pain, muscle aches, injury Neuro- denies headache, dizziness, syncope, seizure activity       Objective:    BP 108/70 mmHg  Pulse 98  Temp(Src) 98.5 F (36.9 C) (Oral)  Resp 20  Ht 5\' 6"  (1.676 m)  Wt 128 lb (58.06 kg)  BMI 20.67 kg/m2  SpO2 93% GEN- NAD, alert and oriented x3 HEENT- PERRL, EOMI, non injected sclera, pink conjunctiva, MMM, oropharynx clear Neck- Supple, no thyromegaly CVS- RRR, no murmur RESP-few scattered wheeze  EXT- No edema, left great toe- mild peeling previous infection site, NT, no fluctuance no swelling Pulses- Radial, DP- 2+        Assessment & Plan:      Problem List Items Addressed This Visit    Hypothyroidism - Primary    Recheck thyroid function studies. Continue Synthroid. With regards to the toe infection that is now clear.      Relevant Orders   TSH   T3, free   T4, free   COPD (chronic obstructive pulmonary disease) with emphysema (HCC)   Relevant Medications   Tiotropium Bromide-Olodaterol (STIOLTO RESPIMAT) 2.5-2.5 MCG/ACT AERS   Other  Relevant Orders   CBC with Differential/Platelet   Comprehensive metabolic panel   Chronic respiratory failure (HCC)    Review pulmonary notes will see how he does on the new inhaler.      BPH (benign prostatic hyperplasia)    Followed by urology also remote history of prostate cancer. He is maintained on Flomax         Note: This dictation was prepared with Dragon dictation along with smaller phrase technology. Any transcriptional errors that result from this process are unintentional.

## 2016-02-20 NOTE — Assessment & Plan Note (Signed)
Review pulmonary notes will see how he does on the new inhaler.

## 2016-02-20 NOTE — Assessment & Plan Note (Signed)
Followed by urology also remote history of prostate cancer. He is maintained on Flomax

## 2016-02-20 NOTE — Patient Instructions (Signed)
Use the miralax twice a week  We will call with lab results  F/U Oct for physical

## 2016-02-20 NOTE — Assessment & Plan Note (Signed)
Recheck thyroid function studies. Continue Synthroid. With regards to the toe infection that is now clear.

## 2016-02-21 LAB — COMPREHENSIVE METABOLIC PANEL
ALT: 23 U/L (ref 9–46)
AST: 26 U/L (ref 10–35)
Albumin: 4.5 g/dL (ref 3.6–5.1)
Alkaline Phosphatase: 58 U/L (ref 40–115)
BUN: 20 mg/dL (ref 7–25)
CO2: 31 mmol/L (ref 20–31)
Calcium: 9.6 mg/dL (ref 8.6–10.3)
Chloride: 102 mmol/L (ref 98–110)
Creat: 0.92 mg/dL (ref 0.70–1.11)
Glucose, Bld: 90 mg/dL (ref 70–99)
Potassium: 5 mmol/L (ref 3.5–5.3)
Sodium: 140 mmol/L (ref 135–146)
Total Bilirubin: 0.3 mg/dL (ref 0.2–1.2)
Total Protein: 6.6 g/dL (ref 6.1–8.1)

## 2016-02-21 LAB — CBC WITH DIFFERENTIAL/PLATELET
Basophils Absolute: 0 cells/uL (ref 0–200)
Basophils Relative: 0 %
Eosinophils Absolute: 276 cells/uL (ref 15–500)
Eosinophils Relative: 4 %
HCT: 41.8 % (ref 38.5–50.0)
Hemoglobin: 13.9 g/dL (ref 13.0–17.0)
Lymphocytes Relative: 30 %
Lymphs Abs: 2070 cells/uL (ref 850–3900)
MCH: 29 pg (ref 27.0–33.0)
MCHC: 33.3 g/dL (ref 32.0–36.0)
MCV: 87.1 fL (ref 80.0–100.0)
MPV: 9.7 fL (ref 7.5–12.5)
Monocytes Absolute: 759 cells/uL (ref 200–950)
Monocytes Relative: 11 %
Neutro Abs: 3795 cells/uL (ref 1500–7800)
Neutrophils Relative %: 55 %
Platelets: 318 10*3/uL (ref 140–400)
RBC: 4.8 MIL/uL (ref 4.20–5.80)
RDW: 14.3 % (ref 11.0–15.0)
WBC: 6.9 10*3/uL (ref 3.8–10.8)

## 2016-02-21 LAB — T4, FREE: Free T4: 1.5 ng/dL (ref 0.8–1.8)

## 2016-02-21 LAB — TSH: TSH: 1.71 mIU/L (ref 0.40–4.50)

## 2016-02-21 LAB — T3, FREE: T3, Free: 2.4 pg/mL (ref 2.3–4.2)

## 2016-02-25 DIAGNOSIS — J439 Emphysema, unspecified: Secondary | ICD-10-CM | POA: Diagnosis not present

## 2016-03-06 ENCOUNTER — Ambulatory Visit: Payer: PPO | Admitting: Family Medicine

## 2016-03-15 ENCOUNTER — Encounter: Payer: Self-pay | Admitting: Emergency Medicine

## 2016-03-15 ENCOUNTER — Ambulatory Visit (INDEPENDENT_AMBULATORY_CARE_PROVIDER_SITE_OTHER): Payer: PPO | Admitting: Emergency Medicine

## 2016-03-15 VITALS — BP 124/76 | HR 90 | Ht 65.5 in | Wt 128.0 lb

## 2016-03-15 DIAGNOSIS — J438 Other emphysema: Secondary | ICD-10-CM

## 2016-03-15 DIAGNOSIS — J309 Allergic rhinitis, unspecified: Secondary | ICD-10-CM | POA: Diagnosis not present

## 2016-03-15 MED ORDER — FLUTICASONE PROPIONATE 50 MCG/ACT NA SUSP: 2.0000 | Freq: Every day | NASAL | Status: DC

## 2016-03-15 MED ORDER — ALBUTEROL SULFATE HFA 108 (90 BASE) MCG/ACT IN AERS: 2.0000 | INHALATION_SPRAY | Freq: Four times a day (QID) | RESPIRATORY_TRACT | Status: DC | PRN

## 2016-03-15 NOTE — Assessment & Plan Note (Signed)
Continue your loratadine (Claritin) as you are taking it Start fluticasone nasal spray (Flonse) 2 sprays each nostril daily.  You may use Sudafed as needed.

## 2016-03-15 NOTE — Assessment & Plan Note (Signed)
Please continue Stiolto 2 puffs once a day.  Keep albuterol available to use 2 puffs as needed for shortness of breath.  Continue oxygen at 2-3 L/min Follow with Dr Lamonte Sakai in 4 months or sooner if you have any problems.

## 2016-03-15 NOTE — Progress Notes (Signed)
Acute Visit 12/02/14 --  HPI: 82 year old gentleman followed by Dr.Clance  for severe COPD with associated chronic hypoxemic respiratory failure. He was last seen by [facility] in December 2015. He developed URI sx about a week ago, but no fever, aches, GI sx. More recently he did have a fever and some aching in back. He has developed wheeze, cough, some decrease in his exertional tolerance. He is coughing up purulent mucous.  He has desaturated to mid 80's. His prn combivent use is stable at 1-2x a day. He has been on symbicort reliably.   ROV 04/28/15 -- follow-up visit for severe COPD, chronic hypoxemic respiratory failure. He has been managed on symbicort and combivent prn. Uses combivent about 1-2x a day. He has been stable since last visit with Dr Gwenette Greet. He has modified his activity some, but is able to exert. He stops to rest w stairs and hills. He usually wears his O2 with exertion, set on 2L/min. His last flare was March '16. Rare cough, occas wheeze. No CP, no edema.   ROV 11/15/15 -- patient with a history of severe COPD, chronic hypoxemic respiratory failure,he has been treated with Symbicort twice a day and uses Combivent when necessary. He tells me that he developed URI sx a week ago, started to have an AE, was seen by Dr Buelah Manis, was started on pred + doxycycline. He is starting to feel better. The prednisone keeps him awake, but he is tapering.    ROV 02/14/16 -- patient with history of severe COPD, chronic hypoxemic respiratory failure. Currently managed on Symbicort twice a day, Combivent when necessary, about 2x a day. he has been on Spiriva in the past but did not benefit from this.  He believes that his breathing is a bit worse than last time. More exertional SOB. His allergies are fairly well controlled w loratadine - he does sneeze some. He does not hear wheeze. No cough. He has to stop to rest after walking through the house or up 15 stairs. His o2 is set at 2l/min pulsed. He was started  on atrovent nebs prn about 2 months ago, uses prn. His wife notes that he has had some aspiration of food/drink occasionally.   ROV 03/15/16 -- follow-up visit for severe COPD, chronic hypoxemic respiratory failure. History of expressing progression of dyspnea and last time we changed his Symbicort to [employer].  He took for a month, believes that he may have benefited but he has had co-existing increase in allergy sx, cough, nasal congestion. He increased his loratadine, started pseudophed. No real increase in wheeze, has a hoarse voice.    CAT Score 04/28/2015 04/20/2014  Total CAT Score 14 7      Filed Vitals:   03/15/16 1210  BP: 124/76  Pulse: 90  Height: 5' 5.5" (1.664 m)  Weight: 128 lb (58.06 kg)  SpO2: 92%   Gen: Pleasant, thin man, in no distress on O2,  normal affect  ENT: No lesions,  mouth clear,  oropharynx clear, no postnasal drip  Neck: No JVD, no TMG, no carotid bruits  Lungs: No use of accessory muscles, VERY distant, no wheeze  Cardiovascular: RRR, heart sounds normal, no murmur or gallops, no peripheral edema  Musculoskeletal: No deformities, no cyanosis or clubbing  Neuro: alert, non focal  Skin: Warm, no lesions or rashes   COPD (chronic obstructive pulmonary disease) with emphysema Please continue Stiolto 2 puffs once a day.  Keep albuterol available to use 2 puffs as needed for shortness of  breath.  Continue oxygen at 2-3 L/min Follow with Dr Lamonte Sakai in 4 months or sooner if you have any problems.  Allergic rhinitis Continue your loratadine (Claritin) as you are taking it Start fluticasone nasal spray (Flonse) 2 sprays each nostril daily.  You may use Sudafed as needed.    Baltazar Apo, MD, PhD 03/15/2016, 12:37 PM [facility] [phone] or if no answer [phone]

## 2016-03-15 NOTE — Patient Instructions (Signed)
Please continue Stiolto 2 puffs once a day.  Keep albuterol available to use 2 puffs as needed for shortness of breath.  Continue oxygen at 2-3 L/min Continue your loratadine (Claritin) as you are taking it Start fluticasone nasal spray (Flonse) 2 sprays each nostril daily.  You may use Sudafed as needed.  Follow with Dr Lamonte Sakai in 4 months or sooner if you have any problems.

## 2016-03-19 ENCOUNTER — Telehealth: Payer: Self-pay | Admitting: Emergency Medicine

## 2016-03-19 MED ORDER — TIOTROPIUM BROMIDE-OLODATEROL 2.5-2.5 MCG/ACT IN AERS: 2.0000 | INHALATION_SPRAY | Freq: Every day | RESPIRATORY_TRACT | Status: DC

## 2016-03-19 NOTE — Telephone Encounter (Signed)
Pt advised last OV to continue Stiolto as directed.  [facility] [place]  Rx sent. Nothing further needed.

## 2016-03-26 DIAGNOSIS — J439 Emphysema, unspecified: Secondary | ICD-10-CM | POA: Diagnosis not present

## 2016-04-13 DIAGNOSIS — N400 Enlarged prostate without lower urinary tract symptoms: Secondary | ICD-10-CM | POA: Diagnosis not present

## 2016-04-13 DIAGNOSIS — R3912 Poor urinary stream: Secondary | ICD-10-CM | POA: Diagnosis not present

## 2016-04-22 ENCOUNTER — Other Ambulatory Visit: Payer: Self-pay | Admitting: Family Medicine

## 2016-04-26 DIAGNOSIS — J439 Emphysema, unspecified: Secondary | ICD-10-CM | POA: Diagnosis not present

## 2016-05-02 ENCOUNTER — Other Ambulatory Visit: Payer: Self-pay | Admitting: Family Medicine

## 2016-05-03 NOTE — Telephone Encounter (Signed)
Ok to refill??  Last office visit 02/20/2016.  Last refill 11/07/2015, #3 refills.

## 2016-05-04 NOTE — Telephone Encounter (Signed)
Okay 

## 2016-05-04 NOTE — Telephone Encounter (Signed)
Medication called to pharmacy. 

## 2016-05-08 ENCOUNTER — Other Ambulatory Visit: Payer: Self-pay | Admitting: Family Medicine

## 2016-05-27 DIAGNOSIS — J439 Emphysema, unspecified: Secondary | ICD-10-CM | POA: Diagnosis not present

## 2016-06-20 ENCOUNTER — Other Ambulatory Visit: Payer: PPO

## 2016-06-20 DIAGNOSIS — E785 Hyperlipidemia, unspecified: Secondary | ICD-10-CM

## 2016-06-20 DIAGNOSIS — E039 Hypothyroidism, unspecified: Secondary | ICD-10-CM

## 2016-06-20 LAB — CBC WITH DIFFERENTIAL/PLATELET
Basophils Absolute: 55 cells/uL (ref 0–200)
Basophils Relative: 1 %
Eosinophils Absolute: 550 cells/uL — ABNORMAL HIGH (ref 15–500)
Eosinophils Relative: 10 %
HCT: 44.3 % (ref 38.5–50.0)
Hemoglobin: 14.7 g/dL (ref 13.0–17.0)
Lymphocytes Relative: 36 %
Lymphs Abs: 1980 cells/uL (ref 850–3900)
MCH: 28.9 pg (ref 27.0–33.0)
MCHC: 33.2 g/dL (ref 32.0–36.0)
MCV: 87.2 fL (ref 80.0–100.0)
MPV: 9.9 fL (ref 7.5–12.5)
Monocytes Absolute: 605 cells/uL (ref 200–950)
Monocytes Relative: 11 %
Neutro Abs: 2310 cells/uL (ref 1500–7800)
Neutrophils Relative %: 42 %
Platelets: 319 10*3/uL (ref 140–400)
RBC: 5.08 MIL/uL (ref 4.20–5.80)
RDW: 14.2 % (ref 11.0–15.0)
WBC: 5.5 10*3/uL (ref 3.8–10.8)

## 2016-06-20 LAB — COMPREHENSIVE METABOLIC PANEL
ALT: 18 U/L (ref 9–46)
AST: 25 U/L (ref 10–35)
Albumin: 4.3 g/dL (ref 3.6–5.1)
Alkaline Phosphatase: 50 U/L (ref 40–115)
BUN: 15 mg/dL (ref 7–25)
CO2: 31 mmol/L (ref 20–31)
Calcium: 9.8 mg/dL (ref 8.6–10.3)
Chloride: 102 mmol/L (ref 98–110)
Creat: 0.93 mg/dL (ref 0.70–1.11)
Glucose, Bld: 84 mg/dL (ref 70–99)
Potassium: 4.8 mmol/L (ref 3.5–5.3)
Sodium: 140 mmol/L (ref 135–146)
Total Bilirubin: 0.6 mg/dL (ref 0.2–1.2)
Total Protein: 6.4 g/dL (ref 6.1–8.1)

## 2016-06-20 LAB — LIPID PANEL
Cholesterol: 245 mg/dL — ABNORMAL HIGH (ref 125–200)
HDL: 70 mg/dL (ref >=40)
LDL Cholesterol: 152 mg/dL — ABNORMAL HIGH (ref <130)
Total CHOL/HDL Ratio: 3.5 Ratio (ref <=5.0)
Triglycerides: 114 mg/dL (ref <150)
VLDL: 23 mg/dL (ref <30)

## 2016-06-20 LAB — TSH: TSH: 4.19 mIU/L (ref 0.40–4.50)

## 2016-06-22 ENCOUNTER — Encounter: Payer: Self-pay | Admitting: Family Medicine

## 2016-06-22 ENCOUNTER — Ambulatory Visit (INDEPENDENT_AMBULATORY_CARE_PROVIDER_SITE_OTHER): Payer: PPO | Admitting: Family Medicine

## 2016-06-22 VITALS — BP 138/78 | HR 90 | Temp 98.3°F | Resp 18 | Ht 65.5 in | Wt 130.0 lb

## 2016-06-22 DIAGNOSIS — J438 Other emphysema: Secondary | ICD-10-CM

## 2016-06-22 DIAGNOSIS — Z0000 Encounter for general adult medical examination without abnormal findings: Secondary | ICD-10-CM

## 2016-06-22 DIAGNOSIS — E785 Hyperlipidemia, unspecified: Secondary | ICD-10-CM

## 2016-06-22 DIAGNOSIS — E039 Hypothyroidism, unspecified: Secondary | ICD-10-CM

## 2016-06-22 DIAGNOSIS — Z23 Encounter for immunization: Secondary | ICD-10-CM | POA: Diagnosis not present

## 2016-06-22 DIAGNOSIS — F5101 Primary insomnia: Secondary | ICD-10-CM

## 2016-06-22 MED ORDER — ZOLPIDEM TARTRATE 10 MG PO TABS: 5.0000 mg | ORAL_TABLET | Freq: Every evening | ORAL | 1 refills | Status: DC | PRN

## 2016-06-22 NOTE — Patient Instructions (Addendum)
F/U 4 months Take thyroid medication by itself take vitamins at least 3-4 hours later or lunch time Increase claritin to 5mg  twice a day  If this does not work then add zantac for acid reflux  FLu shot given

## 2016-06-22 NOTE — Assessment & Plan Note (Signed)
Iritis TSH has gone up to 4 he has been taking his medications along with his calcium vitamin Will separate these as I think it is binding up too much of the medication. He will have a recheck at her next visit

## 2016-06-22 NOTE — Addendum Note (Signed)
Addended by: Sheral Flow on: 06/22/2016 04:09 PM   Modules accepted: Orders

## 2016-06-22 NOTE — Addendum Note (Signed)
Addended by: Sheral Flow on: 06/22/2016 11:55 AM   Modules accepted: Orders

## 2016-06-22 NOTE — Assessment & Plan Note (Signed)
His COPD P to be at his baseline. This cough attack that he is having could be allergy related with the ragweed and other possibility is silent reflux. I will have him increase his clear 10-5 mg twice a day if this does not help after couple weeks he is to add Zantac at bedtime over-the-counter.

## 2016-06-22 NOTE — Assessment & Plan Note (Signed)
Cholesterol unchanged no new medications

## 2016-06-22 NOTE — Progress Notes (Signed)
Subjective:   Patient presents for Medicare Annual/Subsequent preventive examination.  Patient here for annual wellness his medications were reviewed fasting labs reviewed He has had episodes of harsh cough that occurs typically twice a day. Sometimes they occur after eating other times when he goes out side he states that he feels like something starts tickling his throat and he starts coughing. He has been taking Claritin 5 mg once a day. He did have some mild wheezing this morning but has not felt short of breath or sick. He is taking his inhalers as prescribed by his pulmonologist History of prostate cancer Followed with COPD Review Past Medical/Family/Social: per EMR    Risk Factors  Current exercise habits: walks Dietary issues discussed: None  Cardiac risk factors:  Hyperipidemia  Depression Screen  (Note: if answer to either of the following is "Yes", a more complete depression screening is indicated)  Over the past two weeks, have you felt down, depressed or hopeless? No Over the past two weeks, have you felt little interest or pleasure in doing things? No Have you lost interest or pleasure in daily life? No Do you often feel hopeless? No Do you cry easily over simple problems? No   Activities of Daily Living  In your present state of health, do you have any difficulty performing the following activities?:  Driving? No  Managing money? No  Feeding yourself? No  Getting from bed to chair? No  Climbing a flight of stairs? No  Preparing food and eating?: No  Bathing or showering? No  Getting dressed: No  Getting to the toilet? No  Using the toilet:No  Moving around from place to place: No  In the past year have you fallen or had a near fall?:No  Are you sexually active? No  Do you have more than one partner? No   Hearing Difficulties: No  Do you often ask people to speak up or repeat themselves? No  Do you experience ringing or noises in your ears? No Do you have  difficulty understanding soft or whispered voices? No  Do you feel that you have a problem with memory? No Do you often misplace items? No  Do you feel safe at home? Yes  Cognitive Testing  Alert? Yes Normal Appearance?Yes  Oriented to person? Yes Place? Yes  Time? Yes  Recall of three objects? Yes  Can perform simple calculations? Yes  Displays appropriate judgment?Yes  Can read the correct time from a watch face?Yes   List the Names of Other Physician/Practitioners you currently use:   Pulmonary  Dermatology- Dr. Nevada Crane  Urology  Indicate any recent Medical Services you may have received from other than [facility] providers in the past year (date may be approximate).   Screening Tests / Date Colonoscopy        UTD- aged out           Zostavax - Declines  Pneumonia- UTD Influenza Vaccine - Due  Tetanus/tdap- not covered   GEN- denies fatigue, fever, weight loss,weakness, recent illness HEENT- denies eye drainage, change in vision, nasal discharge, CVS- denies chest pain, palpitations RESP- denies SOB, cough, wheeze ABD- denies N/V, change in stools, abd pain GU- denies dysuria, hematuria, dribbling, incontinence MSK- denies joint pain, muscle aches, injury Neuro- denies headache, dizziness, syncope, seizure activity  Physical: GEN- NAD, alert and oriented x3 HEENT- PERRL, EOMI, non injected sclera, pink conjunctiva, MMM, oropharynx clear Neck- Supple, no thryomegaly CVS- RRR, no murmur RESP-few scattered expiratory wheeze, good air movement,  no rales  ABD-NABS,soft,NT,ND  EXT- No edema Pulses- Radial, DP- 2+    Assessment:    Annual wellness medicare exam   Plan:    During the course of the visit the patient was educated and counseled about appropriate screening and preventive services including:  Flu shot given  Screen NEG for depression.    Diet review for nutrition referral? Yes ____ Not Indicated __x__  Patient Instructions (the written plan) was given to  the patient.  Medicare Attestation  I have personally reviewed:  The patient's medical and social history  Their use of alcohol, tobacco or illicit drugs  Their current medications and supplements  The patient's functional ability including ADLs,fall risks, home safety risks, cognitive, and hearing and visual impairment  Diet and physical activities  Evidence for depression or mood disorders  The patient's weight, height, BMI, and visual acuity have been recorded in the chart. I have made referrals, counseling, and provided education to the patient based on review of the above and I have provided the patient with a written personalized care plan for preventive services.

## 2016-06-22 NOTE — Assessment & Plan Note (Signed)
Continue with Ambien he is requesting 90 day supply he has not had any difficulty with this medication no increase in daytime sedation

## 2016-06-25 ENCOUNTER — Telehealth: Payer: Self-pay | Admitting: Family Medicine

## 2016-06-25 NOTE — Telephone Encounter (Signed)
Patient is calling to speak to you regarding the  Do not resuscitate that may or may not be in his chart  [phone]

## 2016-06-26 DIAGNOSIS — J439 Emphysema, unspecified: Secondary | ICD-10-CM | POA: Diagnosis not present

## 2016-06-27 NOTE — Telephone Encounter (Signed)
Call placed to patient.   Advised that no DNR noted on chart.   Patient states that he does not want a DNR on file at this time.

## 2016-07-09 ENCOUNTER — Other Ambulatory Visit: Payer: Self-pay | Admitting: Family Medicine

## 2016-07-09 NOTE — Telephone Encounter (Signed)
okay

## 2016-07-09 NOTE — Telephone Encounter (Signed)
Ok to refill??  Last office visit 06/22/2016.  Last refill 10/10/2015, #2 refills.

## 2016-07-10 NOTE — Telephone Encounter (Signed)
Medication called to pharmacy. 

## 2016-07-12 ENCOUNTER — Other Ambulatory Visit: Payer: Self-pay | Admitting: Family Medicine

## 2016-07-16 ENCOUNTER — Encounter: Payer: Self-pay | Admitting: Emergency Medicine

## 2016-07-16 ENCOUNTER — Ambulatory Visit (INDEPENDENT_AMBULATORY_CARE_PROVIDER_SITE_OTHER): Payer: PPO | Admitting: Emergency Medicine

## 2016-07-16 DIAGNOSIS — J301 Allergic rhinitis due to pollen: Secondary | ICD-10-CM | POA: Diagnosis not present

## 2016-07-16 DIAGNOSIS — J438 Other emphysema: Secondary | ICD-10-CM | POA: Diagnosis not present

## 2016-07-16 MED ORDER — TIOTROPIUM BROMIDE-OLODATEROL 2.5-2.5 MCG/ACT IN AERS: 2.0000 | INHALATION_SPRAY | Freq: Every day | RESPIRATORY_TRACT | 3 refills | Status: DC

## 2016-07-16 NOTE — Assessment & Plan Note (Signed)
  Continue Stiolto as you are taking it  Flu shot up to date.  Take albuterol nebulizer every 4 hours if needed for shortness of breath.  Follow with Dr Lamonte Sakai in 4 months or sooner if you have any problems.

## 2016-07-16 NOTE — Patient Instructions (Addendum)
Continue your loratadine every day.  Start fluticasone nasal spray, 2 sprays each nostril daily Use sudafed as needed.  Start mucinex 600mg  daily.  Continue Stiolto as you are taking it  Flu shot up to date.  Take albuterol nebulizer every 4 hours if needed for shortness of breath.  Follow with Dr Lamonte Sakai in 4 months or sooner if you have any problems.

## 2016-07-16 NOTE — Assessment & Plan Note (Signed)
Continue your loratadine every day.  Start fluticasone nasal spray, 2 sprays each nostril daily Use sudafed as needed.  Start mucinex 600mg  daily.

## 2016-07-16 NOTE — Progress Notes (Signed)
Acute Visit 12/02/14 --  HPI: 82 year old gentleman followed by Dr.Clance  for severe COPD with associated chronic hypoxemic respiratory failure. He was last seen by [facility] in December 2015. He developed URI sx about a week ago, but no fever, aches, GI sx. More recently he did have a fever and some aching in back. He has developed wheeze, cough, some decrease in his exertional tolerance. He is coughing up purulent mucous.  He has desaturated to mid 80's. His prn combivent use is stable at 1-2x a day. He has been on symbicort reliably.   ROV 04/28/15 -- follow-up visit for severe COPD, chronic hypoxemic respiratory failure. He has been managed on symbicort and combivent prn. Uses combivent about 1-2x a day. He has been stable since last visit with Dr Gwenette Greet. He has modified his activity some, but is able to exert. He stops to rest w stairs and hills. He usually wears his O2 with exertion, set on 2L/min. His last flare was March '16. Rare cough, occas wheeze. No CP, no edema.   ROV 11/15/15 -- patient with a history of severe COPD, chronic hypoxemic respiratory failure,he has been treated with Symbicort twice a day and uses Combivent when necessary. He tells me that he developed URI sx a week ago, started to have an AE, was seen by Dr Buelah Manis, was started on pred + doxycycline. He is starting to feel better. The prednisone keeps him awake, but he is tapering.    ROV 02/14/16 -- patient with history of severe COPD, chronic hypoxemic respiratory failure. Currently managed on Symbicort twice a day, Combivent when necessary, about 2x a day. he has been on Spiriva in the past but did not benefit from this.  He believes that his breathing is a bit worse than last time. More exertional SOB. His allergies are fairly well controlled w loratadine - he does sneeze some. He does not hear wheeze. No cough. He has to stop to rest after walking through the house or up 15 stairs. His o2 is set at 2l/min pulsed. He was started  on atrovent nebs prn about 2 months ago, uses prn. His wife notes that he has had some aspiration of food/drink occasionally.   ROV 03/15/16 -- follow-up visit for severe COPD, chronic hypoxemic respiratory failure. History of progression of dyspnea and last time we changed his Symbicort to [employer].  He took for a month, believes that he may have benefited but he has had co-existing increase in allergy sx, cough, nasal congestion. He increased his loratadine, started pseudophed. No real increase in wheeze, has a hoarse voice.   ROV 07/16/16 -- follow-up visit for severe COPD and associated hypoxemia. He is currently managed on Stiolto. He states that his breathing is stable- no worse, now some cough as below. He continues to have some nasal congestion. He is on loratadine, last time we discussed fluticasone nasal spray but he did not start it. For the last 3 weeks he has had more cough, produces congestion about 3 times a day. Wakes him at night coughing sometimes. Uses albuterol nebs 1-2x a day  CAT Score 04/28/2015 04/20/2014  Total CAT Score 14 7      Vitals:   07/16/16 1059 07/16/16 1100  BP:  130/78  Pulse:  (!) 108  SpO2:  91%  Weight: 130 lb (59 kg)   Height: 5\' 6"  (1.676 m)    Gen: Pleasant, thin man, in no distress on O2,  normal affect  ENT: No lesions,  mouth clear,  oropharynx clear, no postnasal drip  Neck: No JVD, no TMG, no carotid bruits  Lungs: No use of accessory muscles, VERY distant, no wheeze  Cardiovascular: RRR, heart sounds normal, no murmur or gallops, no peripheral edema  Musculoskeletal: No deformities, no cyanosis or clubbing  Neuro: alert, non focal  Skin: Warm, no lesions or rashes   COPD (chronic obstructive pulmonary disease) with emphysema  Continue Stiolto as you are taking it  Flu shot up to date.  Take albuterol nebulizer every 4 hours if needed for shortness  of breath.  Follow with Dr Lamonte Sakai in 4 months or sooner if you have any problems.  Allergic rhinitis Continue your loratadine every day.  Start fluticasone nasal spray, 2 sprays each nostril daily Use sudafed as needed.  Start mucinex 600mg  daily.   Baltazar Apo, MD, PhD 07/16/2016, 11:33 AM [facility] [phone] or if no answer [phone]

## 2016-07-23 DIAGNOSIS — L570 Actinic keratosis: Secondary | ICD-10-CM | POA: Diagnosis not present

## 2016-07-23 DIAGNOSIS — L814 Other melanin hyperpigmentation: Secondary | ICD-10-CM | POA: Diagnosis not present

## 2016-07-23 DIAGNOSIS — Z1283 Encounter for screening for malignant neoplasm of skin: Secondary | ICD-10-CM | POA: Diagnosis not present

## 2016-07-23 DIAGNOSIS — D225 Melanocytic nevi of trunk: Secondary | ICD-10-CM | POA: Diagnosis not present

## 2016-07-23 DIAGNOSIS — L820 Inflamed seborrheic keratosis: Secondary | ICD-10-CM | POA: Diagnosis not present

## 2016-07-23 DIAGNOSIS — L821 Other seborrheic keratosis: Secondary | ICD-10-CM | POA: Diagnosis not present

## 2016-07-23 DIAGNOSIS — Z85820 Personal history of malignant melanoma of skin: Secondary | ICD-10-CM | POA: Diagnosis not present

## 2016-07-23 DIAGNOSIS — X32XXXD Exposure to sunlight, subsequent encounter: Secondary | ICD-10-CM | POA: Diagnosis not present

## 2016-07-23 DIAGNOSIS — Z08 Encounter for follow-up examination after completed treatment for malignant neoplasm: Secondary | ICD-10-CM | POA: Diagnosis not present

## 2016-07-27 DIAGNOSIS — J439 Emphysema, unspecified: Secondary | ICD-10-CM | POA: Diagnosis not present

## 2016-08-06 ENCOUNTER — Telehealth: Payer: Self-pay | Admitting: Emergency Medicine

## 2016-08-06 NOTE — Telephone Encounter (Signed)
If cannot reach at home, please call [phone].

## 2016-08-06 NOTE — Telephone Encounter (Signed)
Called and spoke to pt. Pt states his SOB has slightly worsened since being on Stiolto. Pt states he is still having a prod cough with clear to yellow mucus and PND - this is unchanged since being started on Stiolto. Pt is requesting recs/change in medication before 11/23 (thanksgiving).   Dr. Lamonte Sakai please advise. Thanks.

## 2016-08-06 NOTE — Telephone Encounter (Signed)
Patient called to f/u on this message.[name] # [phone].left.John Black

## 2016-08-07 ENCOUNTER — Telehealth: Payer: Self-pay | Admitting: Emergency Medicine

## 2016-08-07 MED ORDER — UMECLIDINIUM-VILANTEROL 62.5-25 MCG/INH IN AEPB: 1.0000 | INHALATION_SPRAY | Freq: Every day | RESPIRATORY_TRACT | 0 refills | Status: AC

## 2016-08-07 NOTE — Telephone Encounter (Signed)
We could try a change to Anoro to see if he prefers this. This would be my next best choice. Ask him if he would like to do this or if he would like to have an OV to discuss further.

## 2016-08-07 NOTE — Telephone Encounter (Signed)
Spoke with pt. He has been instructed on how to use Anoro. Nothing further was needed.

## 2016-08-07 NOTE — Telephone Encounter (Signed)
Pt agreed to try anoro. Pt requested samples before sending ina Rx. Ii have left two samples up front for pt to pickup. Pt voiced understanding and had no further questions. Nothing further needed.

## 2016-08-07 NOTE — Telephone Encounter (Signed)
RB please advise. Thanks.  

## 2016-08-13 ENCOUNTER — Telehealth: Payer: Self-pay | Admitting: Emergency Medicine

## 2016-08-13 NOTE — Telephone Encounter (Signed)
Patient called back to see why he had not been called, as he is leaving at 1:15 pm.  Tried triage with no answer.  Patient had hung up when I went back to him.  Please call patient ASAP.

## 2016-08-13 NOTE — Telephone Encounter (Signed)
Patient is asking that he be called on his wife's cell phone [phone].

## 2016-08-13 NOTE — Telephone Encounter (Signed)
Spoke with pt. States that he was given a sample of Anoro. He reports that this medication seems to helping him better than Stiolto. I asked pt if he would like for me to send a prescription in for this and he declined. Pt wants to finish his samples first before any prescription is sent in. Advised pt to call us back when he has 3-4 days left of the samples. Nothing further was needed at this time.

## 2016-08-14 ENCOUNTER — Telehealth: Payer: Self-pay | Admitting: *Deleted

## 2016-08-14 NOTE — Telephone Encounter (Signed)
Received VM from patient.   Requested return call to discuss issue.   Call placed to patient. [place].

## 2016-08-15 ENCOUNTER — Telehealth: Payer: Self-pay

## 2016-08-15 NOTE — Telephone Encounter (Signed)
Pt states he is having trouble breathing and is req a rx for anxiety be sent in he thinks it may be stress     Pt states he has tried inhalers but they are not working as good. Pt has an appt on 12-1 recommend  he speak with MBD at that time about which will work best

## 2016-08-15 NOTE — Telephone Encounter (Signed)
It would be best for him to see me with regarding this. If his anxiety symptoms are severe and he feels he cant wait then okay to schedule with Olean Ree

## 2016-08-15 NOTE — Telephone Encounter (Signed)
Patient called states he went to see Dr. Lamonte Sakai last week and was placed on a new inhaler. He isn't sure if the inhaler is working or not. He did contact their office and they suggested that he reach out to his PCP about anxiety medication. He states that told him his symptoms sounded more like anxiety instead of breathing issues. He wanted to come in and see Dr. Buelah Manis to discuss. I advised patient that Dr. Buelah Manis was out of the office in a conference until next week. He asked could another provider see him this week. I have put him on [facility] Beth's schedule for Friday.  FYI:

## 2016-08-16 NOTE — Telephone Encounter (Signed)
FYI:   Patient has appointment on 08/17/2016 with PA.

## 2016-08-17 ENCOUNTER — Ambulatory Visit (INDEPENDENT_AMBULATORY_CARE_PROVIDER_SITE_OTHER): Payer: PPO | Admitting: Physician Assistant

## 2016-08-17 ENCOUNTER — Encounter: Payer: Self-pay | Admitting: Physician Assistant

## 2016-08-17 VITALS — BP 134/78 | HR 104 | Temp 98.1°F | Resp 16 | Wt 127.0 lb

## 2016-08-17 DIAGNOSIS — F411 Generalized anxiety disorder: Secondary | ICD-10-CM | POA: Diagnosis not present

## 2016-08-17 MED ORDER — LORAZEPAM 0.5 MG PO TABS: 0.5000 mg | ORAL_TABLET | Freq: Two times a day (BID) | ORAL | 1 refills | Status: DC | PRN

## 2016-08-17 NOTE — Progress Notes (Signed)
Patient ID: John Black [name]: [id number], DOB: [birth year], 82 y.o. Date of Encounter: 08/17/2016, 11:53 AM    Chief Complaint:  Chief Complaint  Patient presents with  . Anxiety     HPI: 82 y.o. year old male here to discuss above. His wife is with him for OV today.   He is currently waring nasal cannula oxygen during visit. Patient states that his oxygen level usually runs 92-93%. Says that whenever he feels uptight his oxygen level will go down and then he  starts "sucking air ". Says that he has been on Symbicort for years. Says that when he saw pulmonologist 3 months ago pt questioned whether the Symbicort was doing its job because he feels like he's having more symptoms recently than he used to. Says then he gave him a month's worth of sample of steolto. When he f/u with them pulmonary prescribed 2 more months of steolto. He had his next appointment with pulmonary he asked if he really felt that the steolto was helping. Patient says that the week of Thanksgiving he was given a sample of Anoro. States that they had 35 guests to Thanksgiving. Then the following day they went to the black Friday sales. That night they had out of town guests and their son's family all went out to dinner. Says that Sunday he went to church and then did nothing and felt good that day. Says that he has noticed that when he is stressed out that's when he has his worst time breathing. Yesterday they went out or having to have their car registered. His wife gave him one of her lorazepam 0.5 mg. She says that it calmed him down. Did not make him feel real drowsy or loopy and did not cause any adverse effects--just  calmed him down. Wife notes that during Thanksgiving holiday when things were so busy and stressful that she and family members caught him standing holding to the back of a chair breathing hard.     Home Meds:   Outpatient Medications Prior to Visit  Medication Sig Dispense Refill  . albuterol  (PROVENTIL HFA;VENTOLIN HFA) 108 (90 Base) MCG/ACT inhaler Inhale 2 puffs into the lungs every 6 (six) hours as needed for wheezing or shortness of breath. 3 Inhaler 3  . albuterol (PROVENTIL) (2.5 MG/3ML) 0.083% nebulizer solution Take 3 mLs (2.5 mg total) by nebulization every 6 (six) hours as needed for wheezing or shortness of breath. 150 mL 12  . albuterol-ipratropium (COMBIVENT) 18-103 MCG/ACT inhaler Inhale 2 puffs into the lungs every 4 (four) hours as needed for wheezing or shortness of breath. 3 Inhaler 3  . Calcium-Magnesium-Vitamin D (CITRACAL CALCIUM+D PO) Take 1 tablet by mouth every morning.     . fluticasone (FLONASE) 50 MCG/ACT nasal spray Place 2 sprays into both nostrils daily. 16 g 5  . ibuprofen (ADVIL,MOTRIN) 400 MG tablet Take 400 mg by mouth every 6 (six) hours as needed for fever, headache or mild pain.     Marland Kitchen ipratropium (ATROVENT) 0.02 % nebulizer solution Take 2.5 mLs (0.5 mg total) by nebulization every 6 (six) hours as needed for wheezing or shortness of breath. 75 mL 12  . levothyroxine (SYNTHROID, LEVOTHROID) 75 MCG tablet TAKE 1 TABLET (75 MCG TOTAL) BY MOUTH DAILY. 90 tablet 3  . Melatonin 3 MG TABS Take 1-2 tablets by mouth at bedtime.    . Multiple Vitamin (MULTIVITAMIN WITH MINERALS) TABS tablet Take 1 tablet by mouth every morning.    Marland Kitchen  Omega-3 Fatty Acids (FISH OIL) 600 MG CAPS Take 1 capsule by mouth daily.    . tamsulosin (FLOMAX) 0.4 MG CAPS capsule Take 0.4 capsules by mouth daily.    . Tiotropium Bromide-Olodaterol (STIOLTO RESPIMAT) 2.5-2.5 MCG/ACT AERS Inhale 2 puffs into the lungs daily. 3 Inhaler 3  . traMADol (ULTRAM) 50 MG tablet TAKE 1 TABLET BY MOUTH EVERY 8 HOURS AS NEEDED FOR PAIN 60 tablet 2  . zolpidem (AMBIEN) 10 MG tablet Take 0.5-1 tablets (5-10 mg total) by mouth at bedtime as needed. 90 tablet 1  . polyethylene glycol powder (GLYCOLAX/MIRALAX) powder Take 17 g by mouth 2 (two) times daily as needed. (Patient not taking: Reported on 08/17/2016)  3350 g 1   No facility-administered medications prior to visit.     Allergies:  Allergies  Allergen Reactions  . Morphine     REACTION: sweats      Review of Systems: See HPI for pertinent ROS. All other ROS negative.    Physical Exam: Blood pressure 134/78, pulse , temperature 98.1 F (36.7 C), temperature source Oral, resp. rate 16, weight 127 lb (57.6 kg), SpO2 92 %., Body mass index is 20.5 kg/m. General:  82 y.o. WM on [place] oxygen. Appears in no acute distress. Neck: Supple. No thyromegaly. No lymphadenopathy. Lungs:  Mild wheeze heard throughout bilaterally. Heart: Regular rhythm. No murmurs, rubs, or gallops. Msk:  Strength and tone normal for age. Extremities/Skin: Warm and dry.  Neuro: Alert and oriented X 3. Moves all extremities spontaneously. Gait is normal. CNII-XII grossly in tact. Psych:  Responds to questions appropriately with a normal affect.     ASSESSMENT AND PLAN:  82 y.o. year old male with  1. Anxiety state He has used 1 lorazepam 0.5 mg and this worked perfectly for for him. Therefore will prescribe this same medicine at this same dose. He is to use this as needed. - LORazepam (ATIVAN) 0.5 MG tablet; Take 1 tablet (0.5 mg total) by mouth 2 (two) times daily as needed for anxiety.  Dispense: 30 tablet; Refill: 1   Signed, [address] [place], [place], [facility] 08/17/2016 11:53 AM

## 2016-08-20 ENCOUNTER — Telehealth: Payer: Self-pay | Admitting: Emergency Medicine

## 2016-08-21 NOTE — Telephone Encounter (Signed)
Spoke with pt, states anoro is not working well for him and is wanting to go back to symbicort.  Pt has also tried and failed stiolto.    Pt uses [facility] in [place] please advise if ok to go back to Symbicort.  Thanks!

## 2016-08-21 NOTE — Telephone Encounter (Signed)
Patient would like to go back to Symbicort and stop using Anoro. Contact # [phone].Mearl Latin

## 2016-08-22 MED ORDER — BUDESONIDE-FORMOTEROL FUMARATE 160-4.5 MCG/ACT IN AERO: 2.0000 | INHALATION_SPRAY | Freq: Two times a day (BID) | RESPIRATORY_TRACT | 5 refills | Status: DC

## 2016-08-22 NOTE — Telephone Encounter (Signed)
Pt returning call about med request.John Black

## 2016-08-22 NOTE — Telephone Encounter (Signed)
OK to go back to Symbicort

## 2016-08-22 NOTE — Telephone Encounter (Signed)
Spoke with pt. Advised him that we are still waiting on a response from [facility]. He was very upset about this, he was under the impression this was going to be taken care of yesterday.  RB - please advise. Thanks.

## 2016-08-22 NOTE — Telephone Encounter (Signed)
Spoke with pt. He is aware that RB is okay with him going back on Symbicort. Rx has been sent in. Nothing further was needed.

## 2016-08-26 DIAGNOSIS — J439 Emphysema, unspecified: Secondary | ICD-10-CM | POA: Diagnosis not present

## 2016-09-20 DIAGNOSIS — M79673 Pain in unspecified foot: Secondary | ICD-10-CM | POA: Diagnosis not present

## 2016-09-20 DIAGNOSIS — M79672 Pain in left foot: Secondary | ICD-10-CM | POA: Diagnosis not present

## 2016-09-20 DIAGNOSIS — M76829 Posterior tibial tendinitis, unspecified leg: Secondary | ICD-10-CM | POA: Diagnosis not present

## 2016-09-26 DIAGNOSIS — J439 Emphysema, unspecified: Secondary | ICD-10-CM | POA: Diagnosis not present

## 2016-10-23 ENCOUNTER — Ambulatory Visit: Payer: PPO | Admitting: Family Medicine

## 2016-10-27 DIAGNOSIS — J439 Emphysema, unspecified: Secondary | ICD-10-CM | POA: Diagnosis not present

## 2016-10-30 ENCOUNTER — Encounter: Payer: Self-pay | Admitting: Family Medicine

## 2016-10-30 ENCOUNTER — Ambulatory Visit (INDEPENDENT_AMBULATORY_CARE_PROVIDER_SITE_OTHER): Payer: PPO | Admitting: Family Medicine

## 2016-10-30 VITALS — BP 132/78 | HR 100 | Temp 97.9°F | Resp 22 | Ht 66.0 in | Wt 129.0 lb

## 2016-10-30 DIAGNOSIS — J438 Other emphysema: Secondary | ICD-10-CM

## 2016-10-30 DIAGNOSIS — F5101 Primary insomnia: Secondary | ICD-10-CM | POA: Diagnosis not present

## 2016-10-30 DIAGNOSIS — S01311A Laceration without foreign body of right ear, initial encounter: Secondary | ICD-10-CM

## 2016-10-30 DIAGNOSIS — J9611 Chronic respiratory failure with hypoxia: Secondary | ICD-10-CM

## 2016-10-30 DIAGNOSIS — E039 Hypothyroidism, unspecified: Secondary | ICD-10-CM

## 2016-10-30 LAB — T3, FREE: T3, Free: 2.8 pg/mL (ref 2.3–4.2)

## 2016-10-30 LAB — TSH: TSH: 2.58 mIU/L (ref 0.40–4.50)

## 2016-10-30 LAB — T4, FREE: Free T4: 1.3 ng/dL (ref 0.8–1.8)

## 2016-10-30 MED ORDER — IPRATROPIUM-ALBUTEROL 20-100 MCG/ACT IN AERS: 1.0000 | INHALATION_SPRAY | Freq: Four times a day (QID) | RESPIRATORY_TRACT | 3 refills | Status: DC

## 2016-10-30 MED ORDER — MUPIROCIN 2 % EX OINT: TOPICAL_OINTMENT | CUTANEOUS | 0 refills | Status: DC

## 2016-10-30 NOTE — Assessment & Plan Note (Signed)
He is currently at his baseline. I've refill to his Combivent he is using his oxygen therapy as prescribed

## 2016-10-30 NOTE — Progress Notes (Signed)
   Subjective:    Patient ID: John Black, male    DOB: 1929-01-31, 83 y.o.   [name]: [id number]  Patient presents for 4 month F/U (is not fasting) Patient here to follow-up chronic medical problems. Medications reviewed. He is still following with pulmonary for his COPD emphysema he is on oxygen therapy  Hypothyroidism he was taking his medication with the supplements therefore his levels were off at the last check. He is due for recheck on his thyroid today.  He is concerned about a spot on the top of his ear that is very sore. Does wear his oxygen rubs and his glasses sit. He also a few weeks ago had a red bump and irritation to his left lower lip he has been using topical antibiotic on it and is no longer painful but it is still little red. He does REM or shaving the area.  Review Of Systems:  GEN- denies fatigue, fever, weight loss,weakness, recent illness HEENT- denies eye drainage, change in vision, nasal discharge, CVS- denies chest pain, palpitations RESP- denies SOB, cough, wheeze ABD- denies N/V, change in stools, abd pain GU- denies dysuria, hematuria, dribbling, incontinence MSK- denies joint pain, muscle aches, injury Neuro- denies headache, dizziness, syncope, seizure activity       Objective:    BP 132/78   Pulse 100   Temp 97.9 F (36.6 C) (Oral)   Resp (!) 22   Ht 5\' 6"  (1.676 m)   Wt 129 lb (58.5 kg)   SpO2 95%   BMI 20.82 kg/m  GEN- NAD, alert and oriented x3 HEENT- PERRL, EOMI, non injected sclera, pink conjunctiva, MMM, oropharynx clear, Right ears split on top, mild erythema, no drainage, TTP, left lower lip, erythema with dry skin crusting, no abscess  Neck- Supple, no thryomegaly CVS- RRR, no murmur RESP-few scattered expiratory wheeze, good air movement, no rales  ABD-NABS,soft,NT,ND  EXT- No edema Pulses- Radial, DP- 2+      Assessment & Plan:      Problem List Items Addressed This Visit    Insomnia    He is doing well with Ambien has  not had any side effects with the medication.      Hypothyroidism - Primary    Recent thyroid function studies      Relevant Orders   TSH   T3, free   T4, free   COPD (chronic obstructive pulmonary disease) with emphysema ([place])    He is currently at his baseline. I've refill to his Combivent he is using his oxygen therapy as prescribed      Relevant Medications   Ipratropium-Albuterol (COMBIVENT) 20-100 MCG/ACT AERS respimat   Chronic respiratory failure ([place])    Other Visit Diagnoses    Laceration of right ear, initial encounter       While laceration from where his glasses and his oxygen tubing well. Advised him to use a little Bactroban to the area keep clean he will do this for a week. He also prior to this to his lip. He is to use a Band-Aid or something to protect the ear from his oxygen tubing       Note: This dictation was prepared with Dragon dictation along with smaller phrase technology. Any transcriptional errors that result from this process are unintentional.

## 2016-10-30 NOTE — Patient Instructions (Addendum)
F/U 4 months  bactroban ointment for 1 week

## 2016-10-30 NOTE — Assessment & Plan Note (Signed)
He is doing well with Ambien has not had any side effects with the medication.

## 2016-10-30 NOTE — Assessment & Plan Note (Signed)
Recent thyroid function studies

## 2016-11-05 ENCOUNTER — Telehealth: Payer: Self-pay | Admitting: Family Medicine

## 2016-11-05 MED ORDER — IPRATROPIUM-ALBUTEROL 20-100 MCG/ACT IN AERS: 1.0000 | INHALATION_SPRAY | Freq: Four times a day (QID) | RESPIRATORY_TRACT | 11 refills | Status: DC

## 2016-11-05 NOTE — Telephone Encounter (Signed)
Prescription sent to pharmacy.

## 2016-11-05 NOTE — Telephone Encounter (Signed)
Pt wants to get a script for combivent inhaler.

## 2016-11-06 ENCOUNTER — Other Ambulatory Visit: Payer: Self-pay | Admitting: *Deleted

## 2016-11-06 MED ORDER — IPRATROPIUM-ALBUTEROL 20-100 MCG/ACT IN AERS: 1.0000 | INHALATION_SPRAY | Freq: Four times a day (QID) | RESPIRATORY_TRACT | 3 refills | Status: DC

## 2016-11-06 NOTE — Telephone Encounter (Signed)
Received call from patient.   Reports that he gets Combivent inhaler from mail order since it is cheaper.   Requested hard copy of prescription to send to mail order.   Prescription printed and patient made aware to come to office to pick up after 2pm on 11/06/2016.

## 2016-11-07 ENCOUNTER — Telehealth: Payer: Self-pay | Admitting: Family Medicine

## 2016-11-07 NOTE — Telephone Encounter (Signed)
Call placed to patient and patient made aware.  

## 2016-11-07 NOTE — Telephone Encounter (Signed)
Okay TO SEND SCRIPT, he does not like the respimat he has tried before

## 2016-11-07 NOTE — Telephone Encounter (Signed)
Pt called back saying that the wrong prescription was given to him and that it isnt what he usually gets. The insurance is having problems with the formulary? For the combivent inhaler.

## 2016-11-07 NOTE — Telephone Encounter (Signed)
Call placed to patient to inquire.   Patient states that he faxed prescription to the mail order pharmacy in [place], [place]. Pharmacy advised patient that the Combivent Respimat is not available there. Also reports that patient has been getting Combivent 18/103 HFA for the past few years.   Combivent HFA has been discontinued in [place]. Combivent Inhalation Aerosol inhalers are being phased out because they contain chlorofluorocarbons or CFCs, which are substances that harm the environment because they decrease the protective ozone layer above the Earth. ([employer])  Patient states that pharmacy reports they have [place] in stock. Will need new prescription.   Ok to order?

## 2016-11-21 ENCOUNTER — Other Ambulatory Visit: Payer: Self-pay | Admitting: Physician Assistant

## 2016-11-21 DIAGNOSIS — F411 Generalized anxiety disorder: Secondary | ICD-10-CM

## 2016-11-21 NOTE — Telephone Encounter (Signed)
Rx called in to pharmacy. 

## 2016-11-21 NOTE — Telephone Encounter (Signed)
Last Ov  2-13 Last refill 08-17-16 Ok to refill?

## 2016-11-21 NOTE — Telephone Encounter (Signed)
Approved. #[id number]. 

## 2016-11-23 ENCOUNTER — Other Ambulatory Visit: Payer: Self-pay | Admitting: Physician Assistant

## 2016-11-23 DIAGNOSIS — F411 Generalized anxiety disorder: Secondary | ICD-10-CM

## 2016-11-24 DIAGNOSIS — J439 Emphysema, unspecified: Secondary | ICD-10-CM | POA: Diagnosis not present

## 2016-11-26 ENCOUNTER — Ambulatory Visit: Payer: PPO | Admitting: Emergency Medicine

## 2016-12-18 ENCOUNTER — Telehealth: Payer: Self-pay | Admitting: Emergency Medicine

## 2016-12-18 NOTE — Telephone Encounter (Signed)
I faxed the last office note from 07/16/2016 to [phone]

## 2016-12-25 DIAGNOSIS — J439 Emphysema, unspecified: Secondary | ICD-10-CM | POA: Diagnosis not present

## 2017-01-03 DIAGNOSIS — K4090 Unilateral inguinal hernia, without obstruction or gangrene, not specified as recurrent: Secondary | ICD-10-CM | POA: Diagnosis not present

## 2017-01-15 ENCOUNTER — Encounter: Payer: Self-pay | Admitting: Emergency Medicine

## 2017-01-15 ENCOUNTER — Ambulatory Visit (INDEPENDENT_AMBULATORY_CARE_PROVIDER_SITE_OTHER): Payer: PPO | Admitting: Emergency Medicine

## 2017-01-15 DIAGNOSIS — J438 Other emphysema: Secondary | ICD-10-CM | POA: Diagnosis not present

## 2017-01-15 NOTE — Assessment & Plan Note (Signed)
We will perform a walking oximetry today on 2L/min on your POC to see if this is adequate.  Please continue your Stiolto Take combivent 2 puffs up to every 6 hours if needed for shortness of breath.

## 2017-01-15 NOTE — Patient Instructions (Addendum)
We will perform a walking oximetry today on 2L/min on your POC to see if this is adequate.  Please continue your Stiolto Take combivent 2 puffs up to every 6 hours if needed for shortness of breath.

## 2017-01-15 NOTE — Progress Notes (Signed)
  Acute Visit 12/02/14 --  HPI: 83 year old gentleman followed by Dr.Clance  for severe COPD with associated chronic hypoxemic respiratory failure. He was last seen by [facility] in December 2015. He developed URI sx about a week ago, but no fever, aches, GI sx. More recently he did have a fever and some aching in back. He has developed wheeze, cough, some decrease in his exertional tolerance. He is coughing up purulent mucous.  He has desaturated to mid 80's. His prn combivent use is stable at 1-2x a day. He has been on symbicort reliably.    ROV 03/15/16 -- follow-up visit for severe COPD, chronic hypoxemic respiratory failure. History of progression of dyspnea and last time we changed his Symbicort to [employer].  He took for a month, believes that he may have benefited but he has had co-existing increase in allergy sx, cough, nasal congestion. He increased his loratadine, started pseudophed. No real increase in wheeze, has a hoarse voice.   ROV 07/16/16 -- follow-up visit for severe COPD and associated hypoxemia. He is currently managed on Stiolto. He states that his breathing is stable- no worse, now some cough as below. He continues to have some nasal congestion. He is on loratadine, last time we discussed fluticasone nasal spray but he did not start it. For the last 3 weeks he has had more cough, produces congestion about 3 times a day. Wakes him at night coughing sometimes. Uses albuterol nebs 1-2x a day                                                                                    ROV 01/15/17 -- patient has a history of severe COPD and hypoxemic respiratory failure. He is currently managed on Stiolto. His O2 is at 2-3L/min via a pulsed system POC. He notes that he has experienced some exertional dyspnea, has on occasion caused him to have urinary incontinence. He was briefly desaturated on 2L/min after walking in today. He has not had syncope or presyncope. Rarely uses nebs, uses combivent prn. No  exacerbations.    No flowsheet data found.    Vitals:   01/15/17 1420  BP: 132/74  Pulse: (!) 121  SpO2: 90%  Weight: 128 lb (58.1 kg)  Height: 5\' 6"  (1.676 m)   Gen: Pleasant, thin man, in no distress on O2,  normal affect  ENT: No lesions,  mouth clear,  oropharynx clear, no postnasal drip  Neck: No JVD, no TMG, no carotid bruits  Lungs: No use of accessory muscles, VERY distant, no wheeze  Cardiovascular: RRR, heart sounds normal, no murmur or gallops, no peripheral edema  Musculoskeletal: No deformities, no cyanosis or clubbing  Neuro: alert, non focal  Skin: Warm, no lesions or rashes   COPD (chronic obstructive pulmonary disease) with emphysema We will perform a walking oximetry today on 2L/min on your POC to see if this is adequate.  Please continue your Stiolto Take combivent 2 puffs up to every 6 hours if needed for shortness of breath.  Baltazar Apo, MD, PhD 01/15/2017, 2:55 PM [place] [facility] [phone] or if no answer [phone]

## 2017-01-24 DIAGNOSIS — J439 Emphysema, unspecified: Secondary | ICD-10-CM | POA: Diagnosis not present

## 2017-02-19 ENCOUNTER — Other Ambulatory Visit: Payer: Self-pay | Admitting: Family Medicine

## 2017-02-19 NOTE — Telephone Encounter (Signed)
Ok to refill 

## 2017-02-19 NOTE — Telephone Encounter (Signed)
okay

## 2017-02-21 NOTE — Telephone Encounter (Signed)
Medication called to pharmacy. 

## 2017-02-24 DIAGNOSIS — J439 Emphysema, unspecified: Secondary | ICD-10-CM | POA: Diagnosis not present

## 2017-03-08 ENCOUNTER — Encounter: Payer: Self-pay | Admitting: Family Medicine

## 2017-03-08 ENCOUNTER — Ambulatory Visit (INDEPENDENT_AMBULATORY_CARE_PROVIDER_SITE_OTHER): Payer: PPO | Admitting: Family Medicine

## 2017-03-08 VITALS — BP 118/62 | HR 93 | Temp 98.1°F | Resp 16 | Wt 126.6 lb

## 2017-03-08 DIAGNOSIS — J438 Other emphysema: Secondary | ICD-10-CM

## 2017-03-08 DIAGNOSIS — E785 Hyperlipidemia, unspecified: Secondary | ICD-10-CM | POA: Diagnosis not present

## 2017-03-08 DIAGNOSIS — N400 Enlarged prostate without lower urinary tract symptoms: Secondary | ICD-10-CM | POA: Diagnosis not present

## 2017-03-08 DIAGNOSIS — J9611 Chronic respiratory failure with hypoxia: Secondary | ICD-10-CM

## 2017-03-08 LAB — COMPREHENSIVE METABOLIC PANEL
ALT: 19 U/L (ref 9–46)
AST: 23 U/L (ref 10–35)
Albumin: 4.3 g/dL (ref 3.6–5.1)
Alkaline Phosphatase: 57 U/L (ref 40–115)
BUN: 17 mg/dL (ref 7–25)
CO2: 26 mmol/L (ref 20–31)
Calcium: 9.4 mg/dL (ref 8.6–10.3)
Chloride: 103 mmol/L (ref 98–110)
Creat: 0.98 mg/dL (ref 0.70–1.11)
Glucose, Bld: 84 mg/dL (ref 70–99)
Potassium: 4.6 mmol/L (ref 3.5–5.3)
Sodium: 139 mmol/L (ref 135–146)
Total Bilirubin: 0.6 mg/dL (ref 0.2–1.2)
Total Protein: 6.6 g/dL (ref 6.1–8.1)

## 2017-03-08 LAB — CBC WITH DIFFERENTIAL/PLATELET
Basophils Absolute: 0 cells/uL (ref 0–200)
Basophils Relative: 0 %
Eosinophils Absolute: 310 cells/uL (ref 15–500)
Eosinophils Relative: 5 %
HCT: 44.8 % (ref 38.5–50.0)
Hemoglobin: 14.4 g/dL (ref 13.0–17.0)
Lymphocytes Relative: 25 %
Lymphs Abs: 1550 cells/uL (ref 850–3900)
MCH: 28.5 pg (ref 27.0–33.0)
MCHC: 32.1 g/dL (ref 32.0–36.0)
MCV: 88.7 fL (ref 80.0–100.0)
MPV: 9.2 fL (ref 7.5–12.5)
Monocytes Absolute: 682 cells/uL (ref 200–950)
Monocytes Relative: 11 %
Neutro Abs: 3658 cells/uL (ref 1500–7800)
Neutrophils Relative %: 59 %
Platelets: 319 10*3/uL (ref 140–400)
RBC: 5.05 MIL/uL (ref 4.20–5.80)
RDW: 14.4 % (ref 11.0–15.0)
WBC: 6.2 10*3/uL (ref 3.8–10.8)

## 2017-03-08 LAB — LIPID PANEL
Cholesterol: 265 mg/dL — ABNORMAL HIGH (ref <200)
HDL: 79 mg/dL (ref >40)
LDL Cholesterol: 166 mg/dL — ABNORMAL HIGH (ref <100)
Total CHOL/HDL Ratio: 3.4 Ratio (ref <5.0)
Triglycerides: 100 mg/dL (ref <150)
VLDL: 20 mg/dL (ref <30)

## 2017-03-08 NOTE — Progress Notes (Signed)
   Subjective:    Patient ID: John Black, male    DOB: 08-15-1929, 83 y.o.   [name]: [id number]  Patient presents for 4 month routine f/u  Pt Here to follow-up chronic medical problems. Medications reviewed. COPD/emphysema he still follows with pulmonary his breathing has been stable he's using his oxygen as directed.  Seen by pulmonyary oxgen up to 3L. No change to inhalers   Hyperlipidemia he's been trying to treat with diet- last LDL 152  Hypothyrodism- TFT at goal in Feb   BPH mainainted on flomax , seen, by urology Dr. Matilde Sprang  Appetite is good.   Review Of Systems:  GEN- denies fatigue, fever, weight loss,weakness, recent illness HEENT- denies eye drainage, change in vision, nasal discharge, CVS- denies chest pain, palpitations RESP- denies SOB, cough, wheeze ABD- denies N/V, change in stools, abd pain GU- denies dysuria, hematuria, dribbling, incontinence MSK- denies joint pain, muscle aches, injury Neuro- denies headache, dizziness, syncope, seizure activity       Objective:    BP 118/62   Pulse 93   Temp 98.1 F (36.7 C) (Oral)   Resp 16   Wt 126 lb 9.6 oz (57.4 kg)   SpO2 95%   BMI 20.43 kg/m  GEN- NAD, alert and oriented x3 HEENT- PERRL, EOMI, non injected sclera, pink conjunctiva, MMM, oropharynx clear Neck- Supple,  CVS- RRR, no murmur RESP-CTAB,wearig 2L  EXT- No edema Pulses- Radial, DP- 2+        Assessment & Plan:      Problem List Items Addressed This Visit    COPD (chronic obstructive pulmonary disease) with emphysema (HCC)   Mild hyperlipidemia    Recheck lipids at patient request today.      Relevant Orders   CBC with Differential/Platelet   Comprehensive metabolic panel   Lipid panel   Chronic respiratory failure ([place]) - Primary    Reviewed pulmonary note, currently stable 3L oxygen, he still able to do his activities.      Relevant Orders   CBC with Differential/Platelet   BPH (benign prostatic hyperplasia)   Continues on Flomax he is following with the urologist.         Note: This dictation was prepared with Dragon dictation along with smaller phrase technology. Any transcriptional errors that result from this process are unintentional.

## 2017-03-08 NOTE — Assessment & Plan Note (Signed)
Reviewed pulmonary note, currently stable 3L oxygen, he still able to do his activities.

## 2017-03-08 NOTE — Patient Instructions (Signed)
F/U 4 months for Physical  

## 2017-03-08 NOTE — Assessment & Plan Note (Signed)
Continues on Flomax he is following with the urologist.

## 2017-03-08 NOTE — Assessment & Plan Note (Signed)
Recheck lipids at patient request today.

## 2017-03-15 ENCOUNTER — Other Ambulatory Visit: Payer: Self-pay | Admitting: *Deleted

## 2017-03-15 ENCOUNTER — Encounter: Payer: Self-pay | Admitting: *Deleted

## 2017-03-15 MED ORDER — PRAVASTATIN SODIUM 20 MG PO TABS: 20.0000 mg | ORAL_TABLET | Freq: Every day | ORAL | 3 refills | Status: DC

## 2017-03-26 DIAGNOSIS — J439 Emphysema, unspecified: Secondary | ICD-10-CM | POA: Diagnosis not present

## 2017-04-06 ENCOUNTER — Other Ambulatory Visit: Payer: Self-pay | Admitting: Emergency Medicine

## 2017-04-14 ENCOUNTER — Other Ambulatory Visit: Payer: Self-pay | Admitting: Family Medicine

## 2017-04-26 DIAGNOSIS — J439 Emphysema, unspecified: Secondary | ICD-10-CM | POA: Diagnosis not present

## 2017-05-21 ENCOUNTER — Other Ambulatory Visit: Payer: Self-pay | Admitting: Physician Assistant

## 2017-05-21 NOTE — Telephone Encounter (Signed)
Called and spoke to pt and he does take it but very rarely - he never got it refilled from the last time. He states he maybe takes one a week. He still has a few left but likes to have some on hand just in case.

## 2017-05-21 NOTE — Telephone Encounter (Signed)
Ok to refill??      LOV - 03/08/17 LRF 11/21/16 #60/2 by MBD ( I do not see it on his med list)

## 2017-05-21 NOTE — Telephone Encounter (Signed)
I would call and verify this, I dont remember him stating he was still taking at last visit

## 2017-05-21 NOTE — Telephone Encounter (Signed)
Okay to send over #30 tablets

## 2017-05-27 DIAGNOSIS — J439 Emphysema, unspecified: Secondary | ICD-10-CM | POA: Diagnosis not present

## 2017-05-28 ENCOUNTER — Encounter: Payer: Self-pay | Admitting: Emergency Medicine

## 2017-05-28 ENCOUNTER — Ambulatory Visit (INDEPENDENT_AMBULATORY_CARE_PROVIDER_SITE_OTHER): Payer: PPO | Admitting: Emergency Medicine

## 2017-05-28 DIAGNOSIS — Z23 Encounter for immunization: Secondary | ICD-10-CM

## 2017-05-28 DIAGNOSIS — J301 Allergic rhinitis due to pollen: Secondary | ICD-10-CM

## 2017-05-28 DIAGNOSIS — J438 Other emphysema: Secondary | ICD-10-CM | POA: Diagnosis not present

## 2017-05-28 NOTE — Assessment & Plan Note (Signed)
Has loratadine available. Not taking daily at this time. He needs it more regularly during the spring months.

## 2017-05-28 NOTE — Patient Instructions (Signed)
Continue Symbicort 2 puffs twice a day. Remember to rinse and gargle after using this medication. Continue Combivent as needed Flu shot today Follow with Dr Lamonte Sakai in 4 months or sooner if you have any problems.

## 2017-05-28 NOTE — Progress Notes (Signed)
Acute Visit 12/02/14 --  HPI: 83 year old gentleman followed by Dr.Clance  for severe COPD with associated chronic hypoxemic respiratory failure. He was last seen by [facility] in December 2015. He developed URI sx about a week ago, but no fever, aches, GI sx. More recently he did have a fever and some aching in back. He has developed wheeze, cough, some decrease in his exertional tolerance. He is coughing up purulent mucous.  He has desaturated to mid 80's. His prn combivent use is stable at 1-2x a day. He has been on symbicort reliably.    ROV 03/15/16 -- follow-up visit for severe COPD, chronic hypoxemic respiratory failure. History of progression of dyspnea and last time we changed his Symbicort to [employer].  He took for a month, believes that he may have benefited but he has had co-existing increase in allergy sx, cough, nasal congestion. He increased his loratadine, started pseudophed. No real increase in wheeze, has a hoarse voice.   ROV 07/16/16 -- follow-up visit for severe COPD and associated hypoxemia. He is currently managed on Stiolto. He states that his breathing is stable- no worse, now some cough as below. He continues to have some nasal congestion. He is on loratadine, last time we discussed fluticasone nasal spray but he did not start it. For the last 3 weeks he has had more cough, produces congestion about 3 times a day. Wakes him at night coughing sometimes. Uses albuterol nebs 1-2x a day                                                                                     ROV 01/15/17 -- patient has a history of severe COPD and hypoxemic respiratory failure. He is currently managed on Stiolto. His O2 is at 2-3L/min via a pulsed system POC. He notes that he has experienced some exertional dyspnea, has on occasion caused him to have urinary incontinence. He was briefly desaturated on 2L/min after walking in today. He has not had syncope or presyncope. Rarely uses nebs, uses combivent prn. No  exacerbations.   ROV 05/28/17 -- this follow-up visit for patient with a history of severe COPD with hypoxemic respiratory failure. We have been managing him with oxygen to 2-3 L/m, Symbicort. Formerly on [employer] but he did not find this to be superior. He reports today that he has been doing well. He denies any CP, wheeze. Minimal cough. No allergy sx currently - not currently on loratadine. No flares since last time.     No flowsheet data found.   Vitals:   05/28/17 1127  BP: 118/72  Pulse: 95  SpO2: 92%  Weight: 127 lb 9.6 oz (57.9 kg)  Height: 5\' 5"  (1.651 m)   Gen: Pleasant, thin man, in no distress on O2,  normal affect  ENT: No lesions,  mouth clear,  oropharynx clear, no postnasal drip  Neck: No JVD, no TMG, no carotid bruits  Lungs: No use of accessory muscles, good air movement, no wheeze on a regular breath, he does wheeze on a forced expiration  Cardiovascular: RRR, heart sounds normal, no murmur or gallops, no peripheral edema  Musculoskeletal: No deformities, no cyanosis or clubbing  Neuro: alert, non focal  Skin: Warm, no lesions or rashes   COPD (chronic obstructive pulmonary disease) with emphysema Continue Symbicort 2 puffs twice a day. Remember to rinse and gargle after using this medication. Continue Combivent as needed Flu shot today Follow with Dr Lamonte Sakai in 4 months or sooner if you have any problems.  Allergic rhinitis Has loratadine available. Not taking daily at this time. He needs it more regularly during the spring months.  Baltazar Apo, MD, PhD 05/28/2017, 11:52 AM [facility] [phone] or if no answer [phone]

## 2017-05-28 NOTE — Assessment & Plan Note (Signed)
Continue Symbicort 2 puffs twice a day. Remember to rinse and gargle after using this medication. Continue Combivent as needed Flu shot today Follow with Dr Lamonte Sakai in 4 months or sooner if you have any problems.

## 2017-06-24 ENCOUNTER — Other Ambulatory Visit: Payer: Self-pay | Admitting: Family Medicine

## 2017-06-24 NOTE — Telephone Encounter (Signed)
Ok to refill??  Last office visit 03/08/2017.  Last refill 07/10/2016, #2 refills.

## 2017-06-24 NOTE — Telephone Encounter (Signed)
Okay to refill? 

## 2017-06-26 DIAGNOSIS — J439 Emphysema, unspecified: Secondary | ICD-10-CM | POA: Diagnosis not present

## 2017-07-08 ENCOUNTER — Ambulatory Visit (INDEPENDENT_AMBULATORY_CARE_PROVIDER_SITE_OTHER): Payer: PPO | Admitting: Family Medicine

## 2017-07-08 ENCOUNTER — Encounter: Payer: Self-pay | Admitting: Family Medicine

## 2017-07-08 VITALS — BP 110/64 | HR 88 | Temp 98.1°F | Resp 14 | Ht 65.0 in | Wt 130.0 lb

## 2017-07-08 DIAGNOSIS — E039 Hypothyroidism, unspecified: Secondary | ICD-10-CM

## 2017-07-08 DIAGNOSIS — F5101 Primary insomnia: Secondary | ICD-10-CM

## 2017-07-08 DIAGNOSIS — Z0000 Encounter for general adult medical examination without abnormal findings: Secondary | ICD-10-CM | POA: Diagnosis not present

## 2017-07-08 DIAGNOSIS — E785 Hyperlipidemia, unspecified: Secondary | ICD-10-CM

## 2017-07-08 DIAGNOSIS — Z8546 Personal history of malignant neoplasm of prostate: Secondary | ICD-10-CM | POA: Diagnosis not present

## 2017-07-08 LAB — COMPREHENSIVE METABOLIC PANEL
AG Ratio: 2.2 (calc) (ref 1.0–2.5)
ALT: 21 U/L (ref 9–46)
AST: 23 U/L (ref 10–35)
Albumin: 4.4 g/dL (ref 3.6–5.1)
Alkaline phosphatase (APISO): 49 U/L (ref 40–115)
BUN: 19 mg/dL (ref 7–25)
CO2: 29 mmol/L (ref 20–32)
Calcium: 9.5 mg/dL (ref 8.6–10.3)
Chloride: 103 mmol/L (ref 98–110)
Creat: 1.01 mg/dL (ref 0.70–1.11)
Globulin: 2 g/dL (calc) (ref 1.9–3.7)
Glucose, Bld: 95 mg/dL (ref 65–99)
Potassium: 4.6 mmol/L (ref 3.5–5.3)
Sodium: 137 mmol/L (ref 135–146)
Total Bilirubin: 0.6 mg/dL (ref 0.2–1.2)
Total Protein: 6.4 g/dL (ref 6.1–8.1)

## 2017-07-08 LAB — CBC WITH DIFFERENTIAL/PLATELET
Basophils Absolute: 32 cells/uL (ref 0–200)
Basophils Relative: 0.5 %
Eosinophils Absolute: 252 cells/uL (ref 15–500)
Eosinophils Relative: 4 %
HCT: 41.9 % (ref 38.5–50.0)
Hemoglobin: 13.7 g/dL (ref 13.2–17.1)
Lymphs Abs: 1405 cells/uL (ref 850–3900)
MCH: 29 pg (ref 27.0–33.0)
MCHC: 32.7 g/dL (ref 32.0–36.0)
MCV: 88.6 fL (ref 80.0–100.0)
MPV: 9.6 fL (ref 7.5–12.5)
Monocytes Relative: 11.2 %
Neutro Abs: 3906 cells/uL (ref 1500–7800)
Neutrophils Relative %: 62 %
Platelets: 301 10*3/uL (ref 140–400)
RBC: 4.73 10*6/uL (ref 4.20–5.80)
RDW: 13.1 % (ref 11.0–15.0)
Total Lymphocyte: 22.3 %
WBC mixed population: 706 cells/uL (ref 200–950)
WBC: 6.3 10*3/uL (ref 3.8–10.8)

## 2017-07-08 LAB — LIPID PANEL
Cholesterol: 177 mg/dL (ref <200)
HDL: 87 mg/dL (ref >40)
LDL Cholesterol (Calc): 74 mg/dL (calc)
Non-HDL Cholesterol (Calc): 90 mg/dL (calc) (ref <130)
Total CHOL/HDL Ratio: 2 (calc) (ref <5.0)
Triglycerides: 76 mg/dL (ref <150)

## 2017-07-08 LAB — TSH: TSH: 1.76 mIU/L (ref 0.40–4.50)

## 2017-07-08 LAB — T3, FREE: T3, Free: 3 pg/mL (ref 2.3–4.2)

## 2017-07-08 LAB — T4, FREE: Free T4: 1.5 ng/dL (ref 0.8–1.8)

## 2017-07-08 NOTE — Progress Notes (Signed)
Subjective:   Patient presents for Medicare Annual/Subsequent preventive examination.    COPD/Emphysema- uses up to 3L with exertion, reviewed pulmonary not   Has inguinal hernia- has seen general surgey last in June, not recmmended that he  Review Past Medical/Family/Social: Per EMR    Risk Factors  Current exercise habits: walks  Dietary issues discussed: None   Cardiac risk factors: Hyperlipidemia   Depression Screen  (Note: if answer to either of the following is "Yes", a more complete depression screening is indicated)  Over the past two weeks, have you felt down, depressed or hopeless? No Over the past two weeks, have you felt little interest or pleasure in doing things? No Have you lost interest or pleasure in daily life? No Do you often feel hopeless? No Do you cry easily over simple problems? No   Activities of Daily Living  In your present state of health, do you have any difficulty performing the following activities?:  Driving? No  Managing money? No  Feeding yourself? No  Getting from bed to chair? No  Climbing a flight of stairs? Yes Preparing food and eating?: No  Bathing or showering? Yes  Getting dressed: No  Getting to the toilet? No  Using the toilet:No  Moving around from place to place: No  In the past year have you fallen or had a near fall?:No  Are you sexually active? No  Do you have more than one partner? No   Hearing Difficulties: Yes  Do you often ask people to speak up or repeat themselves? Yes   Do you experience ringing or noises in your ears? No Do you have difficulty understanding soft or whispered voices? Yes  Do you feel that you have a problem with memory? No Do you often misplace items? No  Do you feel safe at home? Yes  Cognitive Testing  Alert? Yes Normal Appearance?Yes  Oriented to person? Yes Place? Yes  Time? Yes  Recall of three objects? Yes  Can perform simple calculations? Yes  Displays appropriate judgment?Yes  Can  read the correct time from a watch face?Yes   List the Names of Other Physician/Practitioners you currently use:   Pulmonary  Dermatology- Dr.Hall Urology- Dr. McDiarmid- has appt in December   Screening Tests / Date Colonoscopy  UTD due to age                    Shingles- interested  Pneumonia- UTD  Influenza Vaccine UTD  ROS:  GEN- denies fatigue, fever, weight loss,weakness, recent illness HEENT- denies eye drainage, change in vision, nasal discharge, CVS- denies chest pain, palpitations RESP- denies SOB, cough, wheeze ABD- denies N/V, change in stools, abd pain GU- denies dysuria, hematuria, dribbling, incontinence MSK- denies joint pain, muscle aches, injury Neuro- denies headache, dizziness, syncope, seizure activity  PHYSICAL:  GEN- NAD, alert and oriented x3 HEENT- PERRL, EOMI, non injected sclera, pink conjunctiva, MMM, oropharynx clear Neck- Supple, no thryomegaly CVS- RRR, no murmur RESP-CTAB 2L ABD-NABS,soft,NT,ND  EXT- No edema Pulses- Radial, DP- 2+    Assessment:    Annual wellness medicare exam   Plan:    During the course of the visit the patient was educated and counseled about appropriate screening and preventive services including:  Hyperlipidemia- fasting labs done, continue statin  COPD-well with home oxygen and current regimen reviewed pulmonary note flu shot up-to-date  Plan for Audiology in 2019 when insurance adds to plan    Hypothyroidism recheck thyroid function studies continue Synthroid replacement  Insomnia continues to do well with his Ambien without any side effects   Shingles vaccine. Prescription given to that she can get the vaccine at the pharmacy or Medicare part D.  Screen  NEG for depression. PHQ- 2  score      Patient Instructions (the written plan) was given to the patient.  Medicare Attestation  I have personally reviewed:  The patient's medical and social history  Their use of alcohol, tobacco or illicit  drugs  Their current medications and supplements  The patient's functional ability including ADLs,fall risks, home safety risks, cognitive, and hearing and visual impairment  Diet and physical activities  Evidence for depression or mood disorders  The patient's weight, height, BMI, and visual acuity have been recorded in the chart. I have made referrals, counseling, and provided education to the patient based on review of the above and I have provided the patient with a written personalized care plan for preventive services.

## 2017-07-08 NOTE — Patient Instructions (Addendum)
F/U 4 months  We will call with lab results  

## 2017-07-11 ENCOUNTER — Encounter: Payer: Self-pay | Admitting: *Deleted

## 2017-07-26 ENCOUNTER — Other Ambulatory Visit: Payer: Self-pay | Admitting: Family Medicine

## 2017-07-26 NOTE — Telephone Encounter (Signed)
Medication called to pharmacy. 

## 2017-07-26 NOTE — Telephone Encounter (Signed)
Ok to refill??  Last office visit 07/08/2017.  Last refill 05/21/2017.

## 2017-07-26 NOTE — Telephone Encounter (Signed)
Okay to refill? 

## 2017-07-27 DIAGNOSIS — J439 Emphysema, unspecified: Secondary | ICD-10-CM | POA: Diagnosis not present

## 2017-08-20 DIAGNOSIS — Z961 Presence of intraocular lens: Secondary | ICD-10-CM | POA: Diagnosis not present

## 2017-08-20 DIAGNOSIS — H4312 Vitreous hemorrhage, left eye: Secondary | ICD-10-CM | POA: Diagnosis not present

## 2017-08-24 ENCOUNTER — Other Ambulatory Visit: Payer: Self-pay | Admitting: Family Medicine

## 2017-08-26 DIAGNOSIS — J439 Emphysema, unspecified: Secondary | ICD-10-CM | POA: Diagnosis not present

## 2017-08-28 NOTE — Telephone Encounter (Signed)
Ok to refill??  Last office visit 07/08/2017.  Last refill 02/21/2017, # 1 refill.

## 2017-08-28 NOTE — Telephone Encounter (Signed)
Okay to refill? 

## 2017-08-28 NOTE — Telephone Encounter (Signed)
Medication called to pharmacy. 

## 2017-09-19 ENCOUNTER — Other Ambulatory Visit: Payer: Self-pay | Admitting: Family Medicine

## 2017-09-19 NOTE — Telephone Encounter (Signed)
Ok to refill??  Last office visit 07/08/2017.  Last refill 07/26/2017.

## 2017-09-20 DIAGNOSIS — N400 Enlarged prostate without lower urinary tract symptoms: Secondary | ICD-10-CM | POA: Diagnosis not present

## 2017-09-20 DIAGNOSIS — R339 Retention of urine, unspecified: Secondary | ICD-10-CM | POA: Diagnosis not present

## 2017-09-26 ENCOUNTER — Encounter: Payer: Self-pay | Admitting: Emergency Medicine

## 2017-09-26 ENCOUNTER — Ambulatory Visit: Payer: PPO | Admitting: Emergency Medicine

## 2017-09-26 DIAGNOSIS — J438 Other emphysema: Secondary | ICD-10-CM | POA: Diagnosis not present

## 2017-09-26 DIAGNOSIS — J9611 Chronic respiratory failure with hypoxia: Secondary | ICD-10-CM

## 2017-09-26 DIAGNOSIS — J439 Emphysema, unspecified: Secondary | ICD-10-CM | POA: Diagnosis not present

## 2017-09-26 MED ORDER — TIOTROPIUM BROMIDE MONOHYDRATE 2.5 MCG/ACT IN AERS: 2.0000 | INHALATION_SPRAY | Freq: Every day | RESPIRATORY_TRACT | 0 refills | Status: DC

## 2017-09-26 NOTE — Progress Notes (Signed)
  Acute Visit 12/02/14 --  HPI: 84 year old gentleman followed by Dr.Clance  for severe COPD with associated chronic hypoxemic respiratory failure. He was last seen by [facility] in December 2015. He developed URI sx about a week ago, but no fever, aches, GI sx. More recently he did have a fever and some aching in back. He has developed wheeze, cough, some decrease in his exertional tolerance. He is coughing up purulent mucous.  He has desaturated to mid 80's. His prn combivent use is stable at 1-2x a day. He has been on symbicort reliably.                                          ROV 01/15/17 -- patient has a history of severe COPD and hypoxemic respiratory failure. He is currently managed on Stiolto. His O2 is at 2-3L/min via a pulsed system POC. He notes that he has experienced some exertional dyspnea, has on occasion caused him to have urinary incontinence. He was briefly desaturated on 2L/min after walking in today. He has not had syncope or presyncope. Rarely uses nebs, uses combivent prn. No exacerbations.   ROV 05/28/17 -- this follow-up visit for patient with a history of severe COPD with hypoxemic respiratory failure. We have been managing him with oxygen to 2-3 L/m, Symbicort. Formerly on [employer] but he did not find this to be superior. He reports today that he has been doing well. He denies any CP, wheeze. Minimal cough. No allergy sx currently - not currently on loratadine. No flares since last time.   ROV 09/26/17 --this is a follow-up visit for patient with a history of severe COPD and associated hypoxemic respiratory failure.  He is currently managed on Symbicort. He has been having more exertional SOB since he is increasing o2 up to 3L /min. Notices it with walking, especially climbing stairs. He is seeing some desaturations into the 80's. Uses combivent prn. About 2x a day.    No flowsheet data found.   Vitals:   09/26/17 1214 09/26/17 1215  BP:  132/74  Pulse:  94  SpO2:  90%  Weight:  57.2 kg   Height: 5\' 6"  (1.676 m)    Gen: Pleasant, thin man, in no distress on O2,  normal affect  ENT: No lesions,  mouth clear,  oropharynx clear, no postnasal drip  Neck: No JVD, no TMG, no carotid bruits  Lungs: No use of accessory muscles, good air movement, no wheeze on a regular breath, he does wheeze on a forced expiration  Cardiovascular: RRR, heart sounds normal, no murmur or gallops, no peripheral edema  Musculoskeletal: No deformities, no cyanosis or clubbing  Neuro: alert, non focal  Skin: Warm, no lesions or rashes   COPD (chronic obstructive pulmonary disease) with emphysema Please continue Symbicort 2 puffs twice a day We will try adding Spiriva respimat 2 puffs once a day  Chronic respiratory failure Please continue your oxygen at 2-3 L/min at rest. You will need to increase with exertion, possibly to 4 or 5L. Our goal will be to keep your oxygen saturations greater than 90%.   Baltazar Apo, MD, PhD 05/20/2018, 2:22 PM [facility] [phone] or if no answer [phone]

## 2017-09-26 NOTE — Progress Notes (Signed)
Patient seen in the office today and instructed on use of spiriva respimat 2.5.  Patient expressed understanding and demonstrated technique. °

## 2017-09-26 NOTE — Patient Instructions (Addendum)
Please continue Symbicort 2 puffs twice a day We will try adding Spiriva respimat 2 puffs once a day Please continue your oxygen at 2-3 L/min at rest. You will need to increase with exertion, possibly to 4 or 5L. Our goal will be to keep your oxygen saturations greater than 90%.  Follow with Dr Lamonte Sakai in 2 months or sooner if you have any problems.

## 2017-10-08 ENCOUNTER — Other Ambulatory Visit: Payer: Self-pay | Admitting: Emergency Medicine

## 2017-10-24 ENCOUNTER — Telehealth: Payer: Self-pay | Admitting: *Deleted

## 2017-10-24 NOTE — Telephone Encounter (Signed)
Received call from patient.   Reports that he has ordered stair lift for home. States that he was informed that with prescription, a portion of the cost can be reduced.   Requested prescription to be faxed to: [facility] [phone]~ fax.  [id number]~ customer ID  Ok to order?

## 2017-10-25 NOTE — Telephone Encounter (Signed)
We need more information Need there documenation requirements Need there prescription form

## 2017-10-25 NOTE — Telephone Encounter (Signed)
Call placed to patient and patient made aware.   Patient reports that he also has increased loose stools x2 weeks.   Appointment scheduled.

## 2017-10-27 DIAGNOSIS — J439 Emphysema, unspecified: Secondary | ICD-10-CM | POA: Diagnosis not present

## 2017-10-28 ENCOUNTER — Ambulatory Visit (INDEPENDENT_AMBULATORY_CARE_PROVIDER_SITE_OTHER): Payer: PPO | Admitting: Family Medicine

## 2017-10-28 ENCOUNTER — Encounter: Payer: Self-pay | Admitting: Family Medicine

## 2017-10-28 ENCOUNTER — Other Ambulatory Visit: Payer: Self-pay

## 2017-10-28 VITALS — BP 130/68 | HR 86 | Temp 98.4°F | Resp 18 | Ht 66.0 in | Wt 128.0 lb

## 2017-10-28 DIAGNOSIS — J3489 Other specified disorders of nose and nasal sinuses: Secondary | ICD-10-CM

## 2017-10-28 DIAGNOSIS — J438 Other emphysema: Secondary | ICD-10-CM | POA: Diagnosis not present

## 2017-10-28 DIAGNOSIS — R197 Diarrhea, unspecified: Secondary | ICD-10-CM

## 2017-10-28 NOTE — Assessment & Plan Note (Signed)
COPD at baseline, no changes Small nasal sore, he has bactroban at home

## 2017-10-28 NOTE — Patient Instructions (Signed)
F/U as previous 

## 2017-10-28 NOTE — Progress Notes (Signed)
   Subjective:    Patient ID: John Black, male    DOB: [birth year], 84 y.o.   [name]: [id number]  Patient presents for Illness (x weeks- GI upset, loose stools- states that he stopped statin and bowel issues have improved)  2 weeks ago started with diarrheaal stools multiple throughout the day, no blood in stool, no abd pain, no fever, no change in appetite. Used immodium helped some Started reading medication, he stopped his statin drug and diarrhea resolved  COPD/Chronic respiratory failure- needs form completed for [employer]  has sore in nose from using his oxygen,  Plans to have stair lift placed in home, needs a prescription for this   Review Of Systems:  GEN- denies fatigue, fever, weight loss,weakness, recent illness HEENT- denies eye drainage, change in vision, nasal discharge, CVS- denies chest pain, palpitations RESP- denies SOB, cough, wheeze ABD- denies N/V, change in stools, abd pain GU- denies dysuria, hematuria, dribbling, incontinence MSK- denies joint pain, muscle aches, injury Neuro- denies headache, dizziness, syncope, seizure activity       Objective:    BP 130/68   Pulse 86   Temp 98.4 F (36.9 C) (Oral)   Resp 18   Ht 5\' 6"  (1.676 m)   Wt 128 lb (58.1 kg)   SpO2 95% Comment: 3L/min via [place]  BMI 20.66 kg/m  GEN- NAD, alert and oriented x3 HEENT- PERRL, EOMI, non injected sclera, pink conjunctiva, MMM, oropharynx clear, scab left nares CVS- RRR, no murmur RESP-CTAB,3L  ABD-NABS,soft,NT,ND EXT- No edema Pulses- Radial 2+        Assessment & Plan:      Problem List Items Addressed This Visit      Unprioritized   COPD (chronic obstructive pulmonary disease) with emphysema (HCC)    COPD at baseline, no changes Small nasal sore, he has bactroban at home      Relevant Medications   Ipratropium-Albuterol (COMBIVENT RESPIMAT) 20-100 MCG/ACT AERS respimat    Other Visit Diagnoses    Diarrhea, unspecified type    -  Primary   Resolved, ? if viral etiology wife sick 2 weeks prior. He feels better off the statin, will see how he does , forms completed   Nasal sore          Note: This dictation was prepared with Dragon dictation along with smaller phrase technology. Any transcriptional errors that result from this process are unintentional.

## 2017-11-05 ENCOUNTER — Other Ambulatory Visit: Payer: Self-pay | Admitting: Emergency Medicine

## 2017-11-08 ENCOUNTER — Other Ambulatory Visit: Payer: Self-pay

## 2017-11-08 ENCOUNTER — Ambulatory Visit (INDEPENDENT_AMBULATORY_CARE_PROVIDER_SITE_OTHER): Payer: PPO | Admitting: Family Medicine

## 2017-11-08 ENCOUNTER — Ambulatory Visit: Payer: PPO | Admitting: Family Medicine

## 2017-11-08 ENCOUNTER — Encounter: Payer: Self-pay | Admitting: Family Medicine

## 2017-11-08 VITALS — BP 122/62 | HR 100 | Temp 98.3°F | Resp 18 | Ht 66.0 in | Wt 126.0 lb

## 2017-11-08 DIAGNOSIS — E039 Hypothyroidism, unspecified: Secondary | ICD-10-CM

## 2017-11-08 DIAGNOSIS — R197 Diarrhea, unspecified: Secondary | ICD-10-CM

## 2017-11-08 DIAGNOSIS — J9611 Chronic respiratory failure with hypoxia: Secondary | ICD-10-CM

## 2017-11-08 DIAGNOSIS — E785 Hyperlipidemia, unspecified: Secondary | ICD-10-CM

## 2017-11-08 DIAGNOSIS — J438 Other emphysema: Secondary | ICD-10-CM | POA: Diagnosis not present

## 2017-11-08 DIAGNOSIS — F5101 Primary insomnia: Secondary | ICD-10-CM

## 2017-11-08 NOTE — Assessment & Plan Note (Signed)
Currently at baseline We have also discussed when he is out to bring walker or cane so if he is fatigued has something to steady himself He did have a fall a month or so ago, lost footing

## 2017-11-08 NOTE — Assessment & Plan Note (Signed)
Continues to benefit from use of [place]

## 2017-11-08 NOTE — Progress Notes (Signed)
   Subjective:    Patient ID: John Black, male    DOB: 01/01/1929, 84 y.o.   [name]: [id number]  Patient presents for Follow-up (is fasting)  Here to follow-up chronic medical problems.  He was seen a couple weeks ago for diarrhea. Still has some soft loose stools at least 2-3 in the morning, taking immodium when he has those days.  He stopped the statin drug as he thought this was contributing to the diarrhea. No pain with the episodes  He continues to follow with his pulmonologist for his emphysema oxygen dependent.  Medications reviewed.   Chronic insomnia continues to benefit from the use of Ambien he has not had any falls with the medication.  Hypothyroidism taking Synthroid as prescribed function was done in October was at goal     Review Of Systems:  GEN- denies fatigue, fever, weight loss,weakness, recent illness HEENT- denies eye drainage, change in vision, nasal discharge, CVS- denies chest pain, palpitations RESP- denies SOB, cough, wheeze ABD- denies N/V, +change in stools, abd pain GU- denies dysuria, hematuria, dribbling, incontinence MSK- denies joint pain, muscle aches, injury Neuro- denies headache, dizziness, syncope, seizure activity       Objective:    BP 122/62   Pulse 100   Temp 98.3 F (36.8 C) (Oral)   Resp 18   Ht 5\' 6"  (1.676 m)   Wt 126 lb (57.2 kg)   SpO2 94% Comment: 3L/m via [place]  BMI 20.34 kg/m  GEN- NAD, alert and oriented x3 HEENT- PERRL, EOMI, non injected sclera, pink conjunctiva, MMM, oropharynx clear, scab left nares CVS- RRR, no murmur RESP-CTAB,3L  ABD-NABS,soft,NT,ND EXT- No edema Pulses- Radial 2+        Assessment & Plan:      Problem List Items Addressed This Visit      Unprioritized   Chronic respiratory failure (HCC)   Mild hyperlipidemia    Off statin drug, continued diarrhea, check lipids  For diarrhea, stool pathogens to be done, he is at least maintaining his weight, and has good appetite staying  hydrated, but diarrheal stools for a month now, though they have improved       Relevant Orders   Comprehensive metabolic panel   Lipid panel   Insomnia    Continues to benefit from use of ambien      Hypothyroidism    TFT at goal      COPD (chronic obstructive pulmonary disease) with emphysema ([place]) - Primary    Currently at baseline We have also discussed when he is out to bring walker or cane so if he is fatigued has something to steady himself He did have a fall a month or so ago, lost footing       Other Visit Diagnoses    Diarrhea, unspecified type       Relevant Orders   Comprehensive metabolic panel   Gastrointestinal Pathogen Panel PCR   CBC with Differential/Platelet      Note: This dictation was prepared with Dragon dictation along with smaller phrase technology. Any transcriptional errors that result from this process are unintentional.

## 2017-11-08 NOTE — Assessment & Plan Note (Signed)
TFT at goal 

## 2017-11-08 NOTE — Patient Instructions (Addendum)
F/U 4 months  We will call with lab results  

## 2017-11-08 NOTE — Assessment & Plan Note (Addendum)
Off statin drug, continued diarrhea, check lipids  For diarrhea, stool pathogens to be done, he is at least maintaining his weight, and has good appetite staying hydrated, but diarrheal stools for a month now, though they have improved

## 2017-11-09 LAB — COMPREHENSIVE METABOLIC PANEL
AG Ratio: 1.9 (calc) (ref 1.0–2.5)
ALT: 17 U/L (ref 9–46)
AST: 19 U/L (ref 10–35)
Albumin: 4.1 g/dL (ref 3.6–5.1)
Alkaline phosphatase (APISO): 51 U/L (ref 40–115)
BUN: 23 mg/dL (ref 7–25)
CO2: 29 mmol/L (ref 20–32)
Calcium: 9.4 mg/dL (ref 8.6–10.3)
Chloride: 105 mmol/L (ref 98–110)
Creat: 0.98 mg/dL (ref 0.70–1.11)
Globulin: 2.2 g/dL (calc) (ref 1.9–3.7)
Glucose, Bld: 94 mg/dL (ref 65–99)
Potassium: 4.6 mmol/L (ref 3.5–5.3)
Sodium: 141 mmol/L (ref 135–146)
Total Bilirubin: 0.4 mg/dL (ref 0.2–1.2)
Total Protein: 6.3 g/dL (ref 6.1–8.1)

## 2017-11-09 LAB — EXTRA LAV TOP TUBE

## 2017-11-09 LAB — LIPID PANEL
Cholesterol: 234 mg/dL — ABNORMAL HIGH (ref <200)
HDL: 84 mg/dL (ref >40)
LDL Cholesterol (Calc): 134 mg/dL (calc) — ABNORMAL HIGH
Non-HDL Cholesterol (Calc): 150 mg/dL (calc) — ABNORMAL HIGH (ref <130)
Total CHOL/HDL Ratio: 2.8 (calc) (ref <5.0)
Triglycerides: 67 mg/dL (ref <150)

## 2017-11-11 ENCOUNTER — Encounter: Payer: Self-pay | Admitting: *Deleted

## 2017-11-12 ENCOUNTER — Other Ambulatory Visit: Payer: PPO

## 2017-11-12 DIAGNOSIS — R197 Diarrhea, unspecified: Secondary | ICD-10-CM | POA: Diagnosis not present

## 2017-11-14 LAB — GASTROINTESTINAL PATHOGEN PANEL PCR
C. difficile Tox A/B, PCR: NOT DETECTED
Campylobacter, PCR: NOT DETECTED
Cryptosporidium, PCR: NOT DETECTED
E coli (ETEC) LT/ST PCR: NOT DETECTED
E coli (STEC) stx1/stx2, PCR: NOT DETECTED
E coli 0157, PCR: NOT DETECTED
Giardia lamblia, PCR: NOT DETECTED
Norovirus, PCR: NOT DETECTED
Rotavirus A, PCR: NOT DETECTED
Salmonella, PCR: NOT DETECTED
Shigella, PCR: NOT DETECTED

## 2017-11-24 DIAGNOSIS — J439 Emphysema, unspecified: Secondary | ICD-10-CM | POA: Diagnosis not present

## 2017-12-06 ENCOUNTER — Ambulatory Visit (INDEPENDENT_AMBULATORY_CARE_PROVIDER_SITE_OTHER): Payer: PPO | Admitting: Family Medicine

## 2017-12-06 ENCOUNTER — Ambulatory Visit (HOSPITAL_COMMUNITY)
Admission: RE | Admit: 2017-12-06 | Discharge: 2017-12-06 | Disposition: A | Discharge status: Home / Self Care | Payer: PPO | Source: Ambulatory Visit | Attending: Family Medicine | Admitting: Family Medicine

## 2017-12-06 ENCOUNTER — Other Ambulatory Visit: Payer: Self-pay

## 2017-12-06 ENCOUNTER — Encounter: Payer: Self-pay | Admitting: Family Medicine

## 2017-12-06 VITALS — BP 138/66 | HR 110 | Temp 98.8°F | Resp 18 | Ht 66.0 in | Wt 123.2 lb

## 2017-12-06 DIAGNOSIS — K529 Noninfective gastroenteritis and colitis, unspecified: Secondary | ICD-10-CM | POA: Diagnosis not present

## 2017-12-06 DIAGNOSIS — E039 Hypothyroidism, unspecified: Secondary | ICD-10-CM | POA: Diagnosis not present

## 2017-12-06 DIAGNOSIS — R197 Diarrhea, unspecified: Secondary | ICD-10-CM | POA: Diagnosis not present

## 2017-12-06 IMAGING — DX DG ABDOMEN 2V
2 series · 2 of 2 positions shown · non-contrast
Comparison: None

CLINICAL DATA: Chronic diarrhea for 2 months, history prostate
cancer

EXAM:
ABDOMEN - 2 VIEW

[abdomen erect]
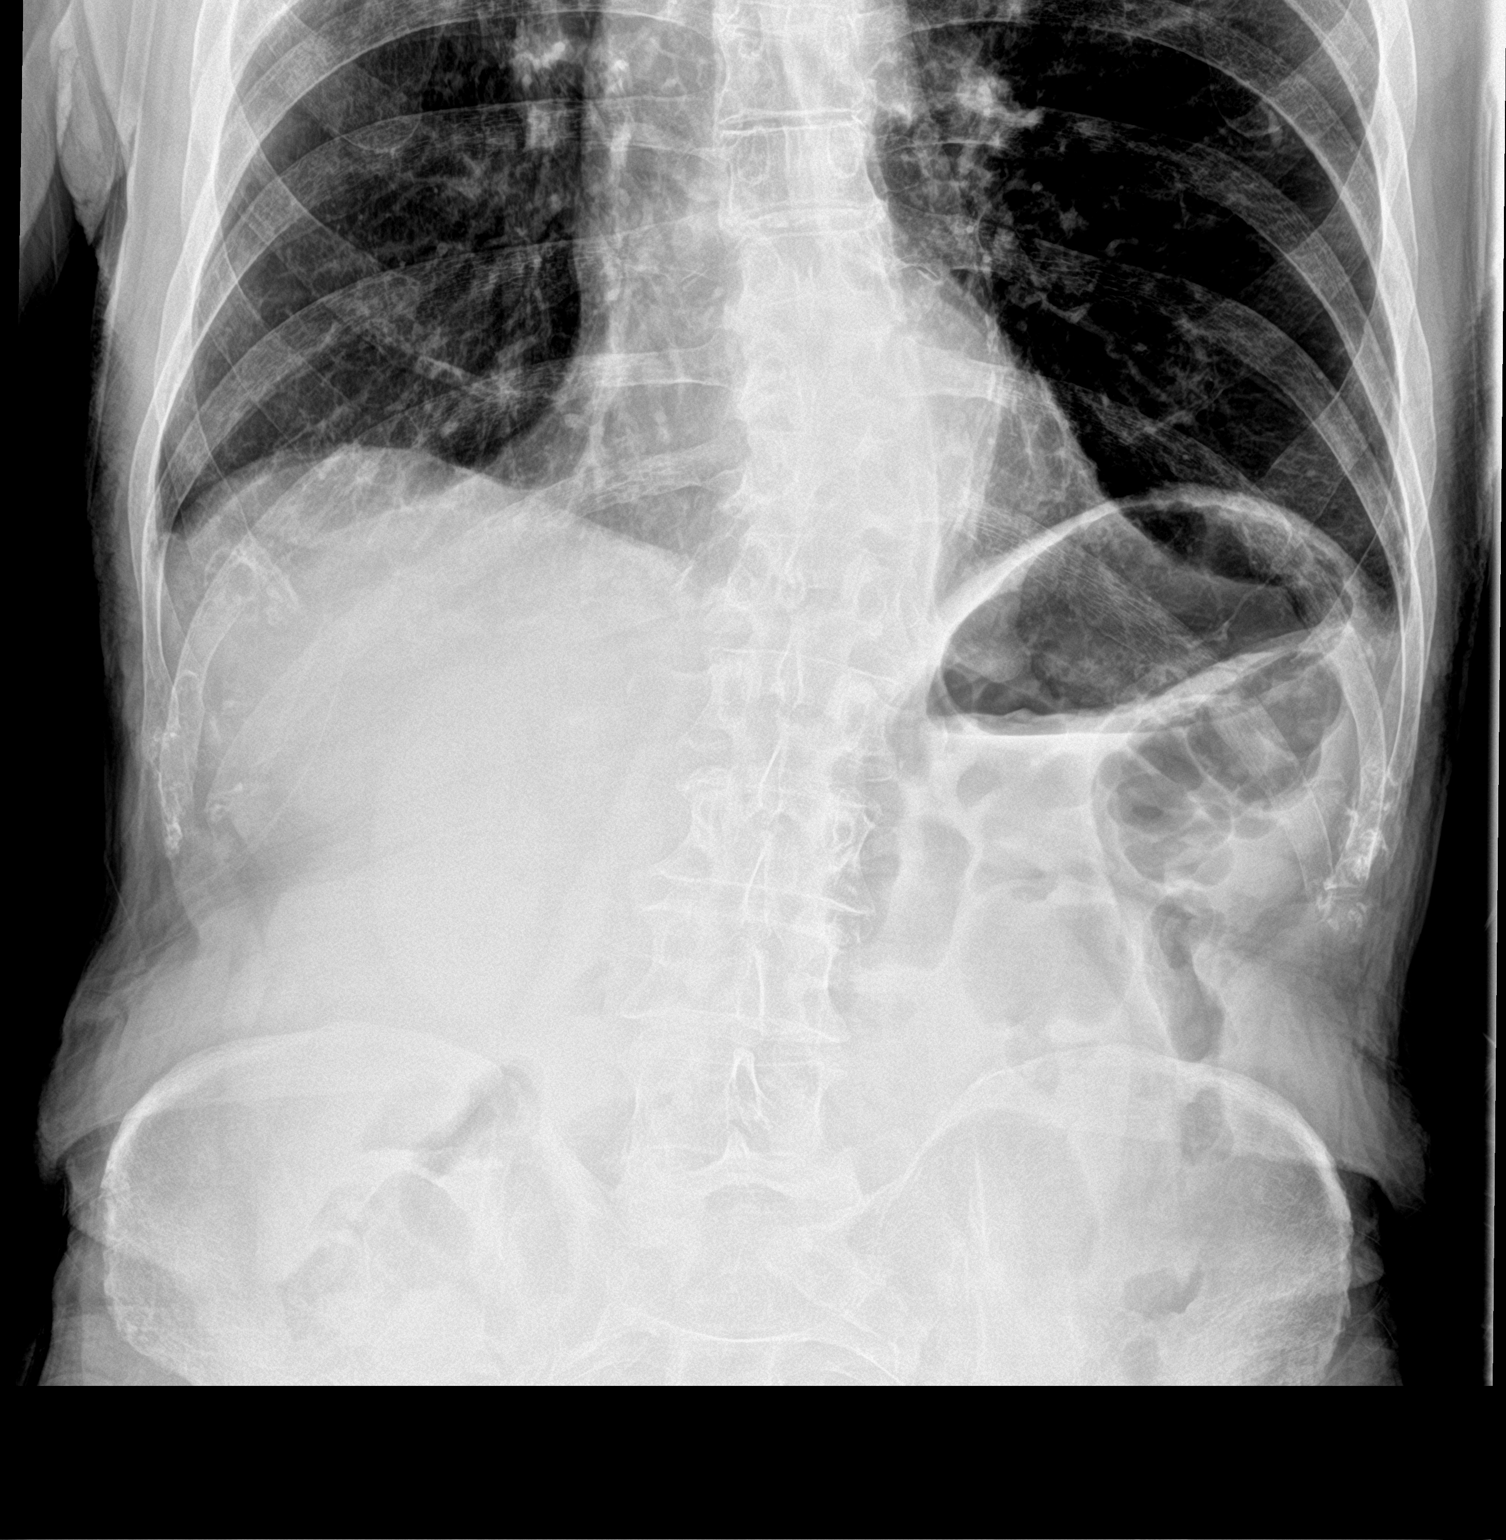

[abdomen supine]
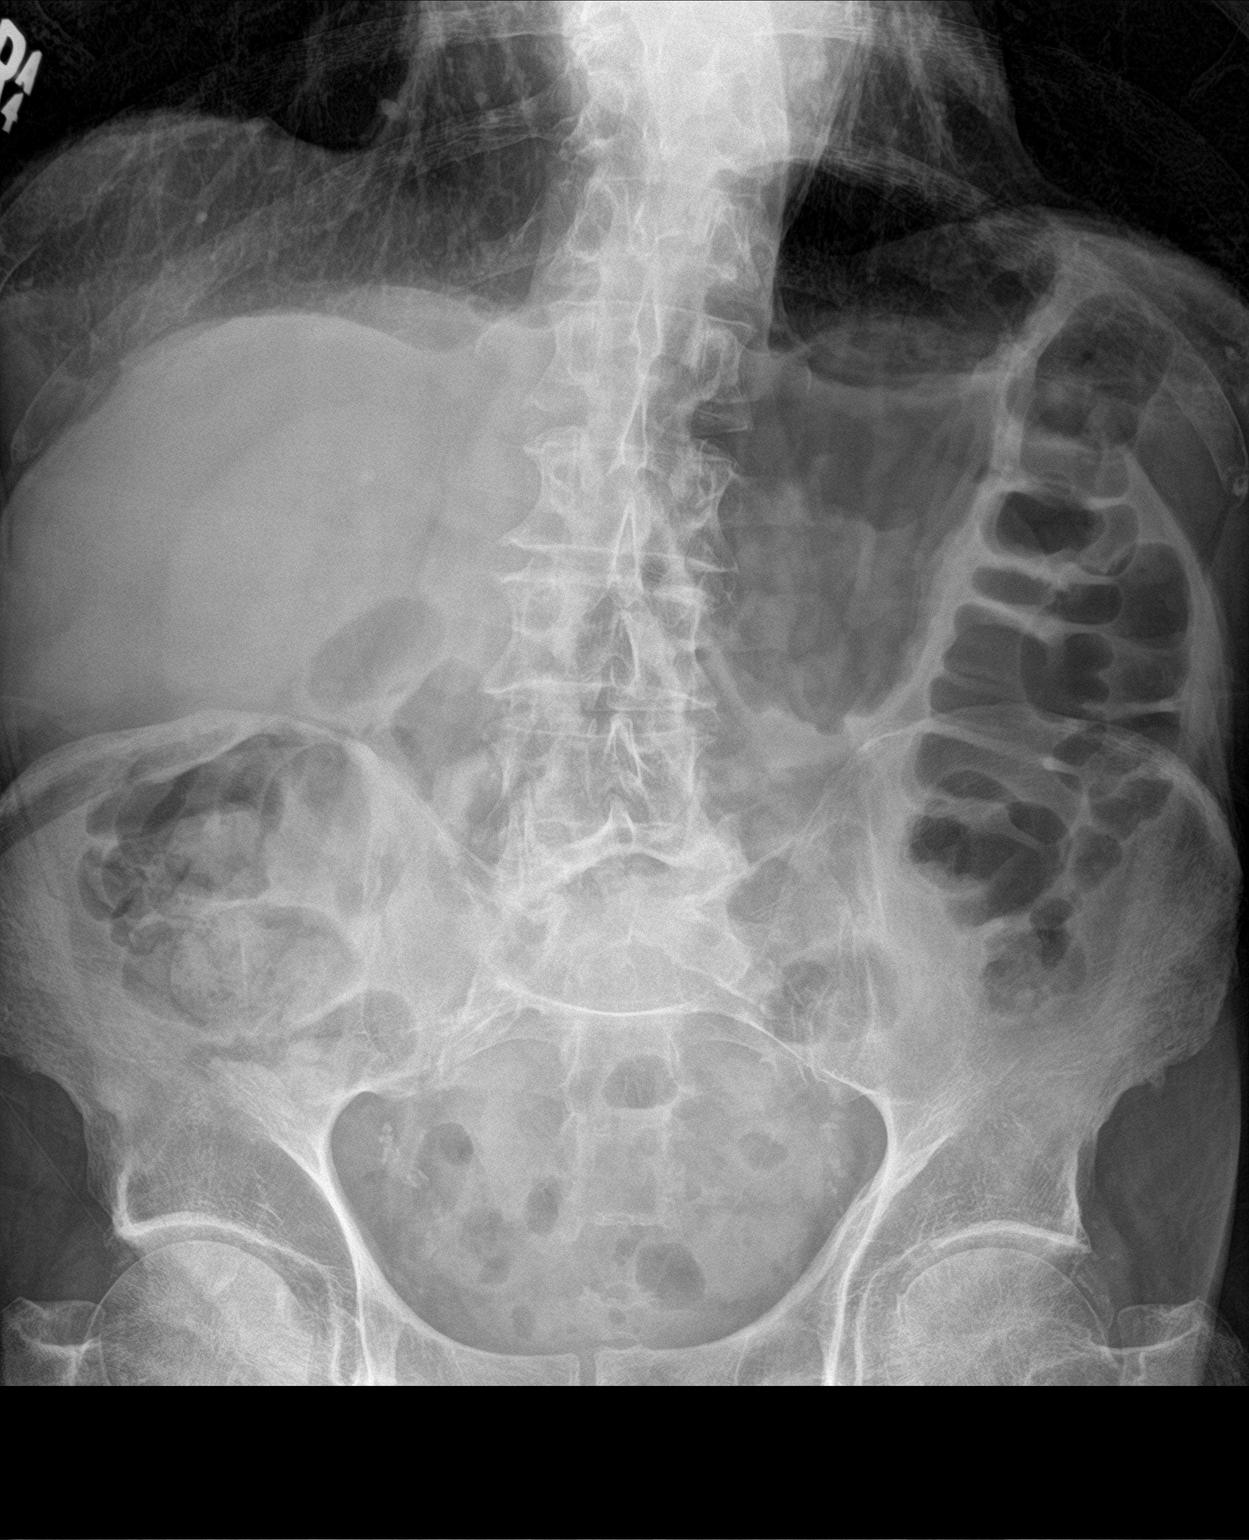

[2 of 2 positions shown; findings below may reference images not displayed]

FINDINGS: Lung bases clear.

Scattered gas and minimal stool in colon.

Small bowel gas pattern normal without bowel dilatation, bowel wall
thickening or free air.

Gaseous distention of stomach.

Brachytherapy seed implants in prostate bed.

4 mm diameter calculus projects over upper pole of RIGHT kidney with
questionable vague 4 mm calculus at mid RIGHT kidney as well.

Vascular calcifications in pelvis.

Bones demineralized with levoconvex thoracolumbar scoliosis.
IMPRESSION: Suspected nonobstructing RIGHT renal calculi.

Normal bowel gas pattern.

## 2017-12-06 NOTE — Progress Notes (Addendum)
   Subjective:    Patient ID: John Black, male    DOB: 15-Nov-1928, 84 y.o.   [name]: [id number]  Patient presents for GI Issues (reports that he still has loose stools) Continues to have some loose bowels for 2 months, he stopped statin but there was no change so he resumed Today went 4 times, but always the diarrhea in the morning , does not have past noon, feels he doesn't empty and goes back with other stools throughout the morning  He had GI pathogen studies done on stools which were normal , FOBT neg  Gets a lot of gas  He is taking magnesium and fish oil   He has lost 3lbs in past 4 weeks but appetiute is good           Review Of Systems:  GEN- denies fatigue, fever, weight loss,weakness, recent illness HEENT- denies eye drainage, change in vision, nasal discharge, CVS- denies chest pain, palpitations RESP- denies SOB, cough, wheeze ABD- denies N/V, +change in stools, abd pain GU- denies dysuria, hematuria, dribbling, incontinence MSK- denies joint pain, muscle aches, injury Neuro- denies headache, dizziness, syncope, seizure activity       Objective:    BP 138/66   Pulse (!) 110   Temp 98.8 F (37.1 C) (Oral)   Resp 18   Ht 5\' 6"  (1.676 m)   Wt 123 lb 3.2 oz (55.9 kg)   SpO2 92% Comment: 4 L/min via [place] at rest  BMI 19.89 kg/m  GEN- NAD, alert and oriented x3 HEENT- PERRL, EOMI, non injected sclera, pink conjunctiva, MMM, oropharynx clear Neck- no thyromegaly CVS- RRR, no murmur RESP-few scattered wheeze ABD-NABS,soft,NT,ND EXT- No edema Pulses- Radial  2+        Assessment & Plan:      Problem List Items Addressed This Visit      Unprioritized   Hypothyroidism - Primary    Check TFT weight change,adjust as needed      Relevant Orders   TSH (Completed)   T3, free (Completed)   T4, free (Completed)   Chronic diarrhea    GI pathogens neg, only has in the morning History of constipation, ? Leaking around constipation Obtain abdominal  xray D/C Magnesium If no constipation to treatment, refer to GI Discussed with patient and wife       Relevant Orders   DG Abd 2 Views (Completed)      Note: This dictation was prepared with Dragon dictation along with smaller phrase technology. Any transcriptional errors that result from this process are unintentional.

## 2017-12-06 NOTE — Patient Instructions (Signed)
We will call with thyroid  Get the xray  Stop magnesium F/U pending results

## 2017-12-07 LAB — T3, FREE: T3, Free: 2.5 pg/mL (ref 2.3–4.2)

## 2017-12-07 LAB — TSH: TSH: 1.33 mIU/L (ref 0.40–4.50)

## 2017-12-07 LAB — T4, FREE: Free T4: 1.3 ng/dL (ref 0.8–1.8)

## 2017-12-08 ENCOUNTER — Encounter: Payer: Self-pay | Admitting: Family Medicine

## 2017-12-08 NOTE — Assessment & Plan Note (Addendum)
GI pathogens neg, only has in the morning History of constipation, ? Leaking around constipation Obtain abdominal xray D/C Magnesium If no constipation to treatment, refer to GI Discussed with patient and wife

## 2017-12-08 NOTE — Assessment & Plan Note (Signed)
Check TFT weight change,adjust as needed

## 2017-12-16 ENCOUNTER — Other Ambulatory Visit: Payer: Self-pay | Admitting: Family Medicine

## 2017-12-16 NOTE — Telephone Encounter (Signed)
Ok to refill??  Last office visit3/22/2019.  Last refill 09/20/2017

## 2017-12-25 ENCOUNTER — Encounter: Payer: Self-pay | Admitting: Emergency Medicine

## 2017-12-25 ENCOUNTER — Ambulatory Visit: Payer: PPO | Admitting: Emergency Medicine

## 2017-12-25 DIAGNOSIS — J438 Other emphysema: Secondary | ICD-10-CM

## 2017-12-25 DIAGNOSIS — J439 Emphysema, unspecified: Secondary | ICD-10-CM | POA: Diagnosis not present

## 2017-12-25 DIAGNOSIS — J9611 Chronic respiratory failure with hypoxia: Secondary | ICD-10-CM

## 2017-12-25 NOTE — Assessment & Plan Note (Signed)
He has up titrated his oxygen with exertion to 4 L/min.  He still has periods of desaturation of asked him to go to as high as 5 L/min and keep track of his saturations with a goal greater than 90% if possible.

## 2017-12-25 NOTE — Assessment & Plan Note (Signed)
No benefit on a retrial of Spiriva added to Symbicort.  I will continue him on Symbicort alone, Combivent as needed.  We will continue your Symbicort twice a day. Rinse and gargle after using. Use Combivent 2 puffs up to every 6 hours if needed for shortness of breath. Continue your oxygen at 2-5 L/min depending on your level of exertion. Flu shot up-to-date, pneumonia shots up-to-date. Speak to Dr. Buelah Manis about whether there is any benefit to the measles vaccine booster in adults. Use nasal saline spray to avoid dryness in your nose Follow with Dr Lamonte Sakai in 4 months or sooner if you have any problems.

## 2017-12-25 NOTE — Patient Instructions (Signed)
We will continue your Symbicort twice a day. Rinse and gargle after using. Use Combivent 2 puffs up to every 6 hours if needed for shortness of breath. Continue your oxygen at 2-5 L/min depending on your level of exertion. Flu shot up-to-date, pneumonia shots up-to-date. Speak to Dr. Buelah Manis about whether there is any benefit to the measles vaccine booster in adults. Use nasal saline spray to avoid dryness in your nose Follow with Dr Lamonte Sakai in 4 months or sooner if you have any problems.

## 2017-12-25 NOTE — Progress Notes (Signed)
  ROV 09/26/17 --this is a follow-up visit for patient with a history of severe COPD and associated hypoxemic respiratory failure.  He is currently managed on Symbicort. He has been having more exertional SOB since he is increasing o2 up to 3L /min. Notices it with walking, especially climbing stairs. He is seeing some desaturations into the 80's. Uses combivent prn. About 2x a day.   ROV 12/25/17 --84 year old man with history of tobacco use and severe COPD with associated chronic respiratory failure.  Last seen January 2019.  He is using oxygen at . Last time we added Spiriva Respimat to his Symbicort. He didn't note any benefit after 2 months so he stopped it. He states that his breathing has been largely unchanged, still has exertional SOB. Notes this with stairs - now has a chair lift in his home. He uses combivent prn, about once a day. He increases his O2 to 4l/min with exertion. No flares since last time. No hospitalizations.       No flowsheet data found.   Vitals:   12/25/17 1330  BP: 134/84  Pulse: 95  SpO2: 90%  Weight: 122 lb (55.3 kg)  Height: 5\' 6"  (1.676 m)   Gen: Pleasant, thin man, in no distress on O2,  normal affect  ENT: No lesions,  mouth clear,  oropharynx clear, no postnasal drip  Neck: No JVD, no stridor  Lungs: No use of accessory muscles, good air movement, no wheeze on a regular breath  Cardiovascular: RRR, heart sounds normal, no murmur or gallops, no peripheral edema  Musculoskeletal: No deformities, no cyanosis or clubbing  Neuro: alert, non focal  Skin: Warm, no lesions or rashes   COPD (chronic obstructive pulmonary disease) with emphysema No benefit on a retrial of Spiriva added to Symbicort.  I will continue him on Symbicort alone, Combivent as needed.  We will continue your Symbicort twice a day. Rinse and gargle after using. Use Combivent 2 puffs up to every 6 hours if needed for shortness of breath. Continue your oxygen at 2-5 L/min depending  on your level of exertion. Flu shot up-to-date, pneumonia shots up-to-date. Speak to Dr. Buelah Manis about whether there is any benefit to the measles vaccine booster in adults. Use nasal saline spray to avoid dryness in your nose Follow with Dr Lamonte Sakai in 4 months or sooner if you have any problems.   Chronic respiratory failure He has up titrated his oxygen with exertion to 4 L/min.  He still has periods of desaturation of asked him to go to as high as 5 L/min and keep track of his saturations with a goal greater than 90% if possible.  Baltazar Apo, MD, PhD 12/25/2017, 1:50 PM [facility] [phone] or if no answer [phone]

## 2018-01-13 ENCOUNTER — Other Ambulatory Visit: Payer: Self-pay | Admitting: *Deleted

## 2018-01-13 MED ORDER — IPRATROPIUM-ALBUTEROL 20-100 MCG/ACT IN AERS: 1.0000 | INHALATION_SPRAY | Freq: Four times a day (QID) | RESPIRATORY_TRACT | 4 refills | Status: DC

## 2018-01-13 NOTE — Telephone Encounter (Signed)
Received call from patient.   Requested prescription for Combivent. States that he will mail to pharmacy in [place].   Prescription printed and will be mailed to patient.

## 2018-01-17 ENCOUNTER — Other Ambulatory Visit: Payer: Self-pay | Admitting: *Deleted

## 2018-01-17 MED ORDER — IPRATROPIUM-ALBUTEROL 20-100 MCG/ACT IN AERS: 1.0000 | INHALATION_SPRAY | Freq: Four times a day (QID) | RESPIRATORY_TRACT | 4 refills | Status: DC

## 2018-01-20 NOTE — Telephone Encounter (Signed)
Patient requesting older version of inhaler that is available in [place]: Combivent 10- 193mcg- 2 puffs every 4-6 hours PRN

## 2018-01-24 DIAGNOSIS — J439 Emphysema, unspecified: Secondary | ICD-10-CM | POA: Diagnosis not present

## 2018-02-17 ENCOUNTER — Other Ambulatory Visit: Payer: Self-pay | Admitting: Family Medicine

## 2018-02-17 NOTE — Telephone Encounter (Signed)
Ok to refill??  Last office visit 12/06/2017.  Last refill 12/16/2017.

## 2018-02-24 DIAGNOSIS — J439 Emphysema, unspecified: Secondary | ICD-10-CM | POA: Diagnosis not present

## 2018-03-07 ENCOUNTER — Other Ambulatory Visit: Payer: Self-pay | Admitting: Family Medicine

## 2018-03-07 NOTE — Telephone Encounter (Signed)
Ok to refill??  Last office visit 12/06/2017.   Last refill 08/28/2017, #1 refill.

## 2018-03-10 ENCOUNTER — Ambulatory Visit: Payer: PPO | Admitting: Family Medicine

## 2018-03-17 ENCOUNTER — Other Ambulatory Visit: Payer: Self-pay

## 2018-03-17 ENCOUNTER — Ambulatory Visit (INDEPENDENT_AMBULATORY_CARE_PROVIDER_SITE_OTHER): Payer: PPO | Admitting: Family Medicine

## 2018-03-17 ENCOUNTER — Encounter: Payer: Self-pay | Admitting: Family Medicine

## 2018-03-17 VITALS — BP 126/78 | HR 76 | Temp 98.4°F | Resp 14 | Ht 66.0 in | Wt 124.0 lb

## 2018-03-17 DIAGNOSIS — E785 Hyperlipidemia, unspecified: Secondary | ICD-10-CM | POA: Diagnosis not present

## 2018-03-17 DIAGNOSIS — J438 Other emphysema: Secondary | ICD-10-CM

## 2018-03-17 DIAGNOSIS — K4090 Unilateral inguinal hernia, without obstruction or gangrene, not specified as recurrent: Secondary | ICD-10-CM

## 2018-03-17 NOTE — Patient Instructions (Addendum)
Referral to surgeon for hernia F/U End of OCT for Physical MBD or Dr. Dennard Schaumann

## 2018-03-17 NOTE — Progress Notes (Signed)
   Subjective:    Patient ID: John Black, male    DOB: 1928/12/21, 84 y.o.   [name]: [id number]  Patient presents for Follow-up (is not fasting)   Yesterday AM, had indigestion took some OTC antacids, went about day. Later that evening started having RLQ pain that continued to worsen. He took a tramadol would help some unless he started getting up to move around. Also holding the area would help. Noticed a bulge in his RLQ  has had hernia pop in and out  Into his scrotum for past 6 months Took his regular night meds and 2nd dose of tramadol and slept fine. This morning hernia was back into his scrotum  COPD- doing well with meds/nebs, wearing oxygen   Hyperlipidemia- back on statin drug, last LDL 134   Review Of Systems:  GEN- denies fatigue, fever, weight loss,weakness, recent illness HEENT- denies eye drainage, change in vision, nasal discharge, CVS- denies chest pain, palpitations RESP- denies SOB, cough, wheeze ABD- denies N/V, change in stools, abd pain GU- denies dysuria, hematuria, dribbling, incontinence MSK- denies joint pain, muscle aches, injury Neuro- denies headache, dizziness, syncope, seizure activity       Objective:    BP 126/78   Pulse 76   Temp 98.4 F (36.9 C) (Oral)   Resp 14   Ht 5\' 6"  (1.676 m)   Wt 124 lb (56.2 kg)   SpO2 96% Comment: 3L/min via [place]  BMI 20.01 kg/m  GEN- NAD, alert and oriented x3 HEENT- PERRL, EOMI, non injected sclera, pink conjunctiva, MMM, oropharynx clear Neck- Supple, CVS- RRR, no murmur RESP-CTAB, wearing oxygen ABD-NABS,soft,NT,ND, right ingual hernia into scrotum, NT EXT- No edema Pulses- Radial, DP- 2+        Assessment & Plan:      Problem List Items Addressed This Visit      Unprioritized   COPD (chronic obstructive pulmonary disease) with emphysema (HCC)    Currently at baseline, No change to medication      Inguinal hernia - Primary    Refer back to general surgery.  His pain has improved since  the hernia is now back down into the scrotal area which she does not have any pain or discomfort with.  But since it is moving back and forth they may want to proceed with surgical intervention.      Relevant Orders   Ambulatory referral to General Surgery   Mild hyperlipidemia    Continue statin drug.  His lipids function need to be checked at the next visit.  He will also need flu shot at that time.         Note: This dictation was prepared with Dragon dictation along with smaller phrase technology. Any transcriptional errors that result from this process are unintentional.

## 2018-03-17 NOTE — Assessment & Plan Note (Signed)
Refer back to general surgery.  His pain has improved since the hernia is now back down into the scrotal area which she does not have any pain or discomfort with.  But since it is moving back and forth they may want to proceed with surgical intervention.

## 2018-03-17 NOTE — Assessment & Plan Note (Signed)
Continue statin drug.  His lipids function need to be checked at the next visit.  He will also need flu shot at that time.

## 2018-03-17 NOTE — Assessment & Plan Note (Signed)
Currently at baseline, No change to medication

## 2018-03-26 DIAGNOSIS — J439 Emphysema, unspecified: Secondary | ICD-10-CM | POA: Diagnosis not present

## 2018-04-01 ENCOUNTER — Other Ambulatory Visit: Payer: Self-pay | Admitting: Family Medicine

## 2018-04-01 NOTE — Telephone Encounter (Signed)
Ok to refill??  Last office visit   Last refill 02/17/2018.

## 2018-04-07 DIAGNOSIS — K4090 Unilateral inguinal hernia, without obstruction or gangrene, not specified as recurrent: Secondary | ICD-10-CM | POA: Diagnosis not present

## 2018-04-10 ENCOUNTER — Other Ambulatory Visit: Payer: Self-pay | Admitting: Emergency Medicine

## 2018-04-12 ENCOUNTER — Other Ambulatory Visit: Payer: Self-pay | Admitting: Family Medicine

## 2018-04-26 DIAGNOSIS — J439 Emphysema, unspecified: Secondary | ICD-10-CM | POA: Diagnosis not present

## 2018-04-29 ENCOUNTER — Ambulatory Visit: Payer: PPO | Admitting: Emergency Medicine

## 2018-04-29 ENCOUNTER — Encounter: Payer: Self-pay | Admitting: Emergency Medicine

## 2018-04-29 DIAGNOSIS — J438 Other emphysema: Secondary | ICD-10-CM | POA: Diagnosis not present

## 2018-04-29 DIAGNOSIS — J9611 Chronic respiratory failure with hypoxia: Secondary | ICD-10-CM | POA: Diagnosis not present

## 2018-04-29 MED ORDER — FLUTICASONE-UMECLIDIN-VILANT 100-62.5-25 MCG/INH IN AEPB: 1.0000 | INHALATION_SPRAY | Freq: Every day | RESPIRATORY_TRACT | 0 refills | Status: DC

## 2018-04-29 NOTE — Assessment & Plan Note (Signed)
We will temporarily stop Symbicort. We will do a trial of Trelegy, one inhalation once a day.  Call our office if your benefit from the Trelegy and we will order through your pharmacy Keep your combivent available to use 2 puffs up to every 6 hours if needed for shortness of breath.  Get the flu shot in the fall Your pneumonia vaccines are up to date.  Follow with Dr Lamonte Sakai in 4 months or sooner if you have any problems.

## 2018-04-29 NOTE — Assessment & Plan Note (Signed)
continue o2 at 3L/min, O to uptitrate to 5L/min w exertion

## 2018-04-29 NOTE — Progress Notes (Signed)
HPI:  ROV 04/29/18 --[name] is an 84 year old gentleman with a history of tobacco use and severe COPD.  He has chronic hypoxemic respiratory failure.  He is maintained on Symbicort.  We tried adding Spiriva Respimat in the past but he did not feel any benefit so he did not continue it.  He has Combivent to use as needed, uses it approximately 2-3x a day. He is using his O2 at 3-4 L/min. He has a new POC. He believes that his breathing is stable, still has same exertional dyspnea. No flares since last time. He reports some diarrhea for the last couple months.      No flowsheet data found.   Vitals:   04/29/18 1104  BP: 122/74  Pulse: 91  SpO2: 90%  Weight: 122 lb (55.3 kg)  Height: 5\' 6"  (1.676 m)   Gen: Pleasant, thin man, in no distress on O2,  normal affect  ENT: No lesions,  mouth clear,  oropharynx clear, no postnasal drip  Neck: No JVD, no stridor  Lungs: No use of accessory muscles, good air movement, no wheeze on a regular breath  Cardiovascular: RRR, heart sounds normal, no murmur or gallops, no peripheral edema  Musculoskeletal: No deformities, no cyanosis or clubbing  Neuro: alert, non focal  Skin: Warm, no lesions or rashes   COPD (chronic obstructive pulmonary disease) with emphysema We will temporarily stop Symbicort. We will do a trial of Trelegy, one inhalation once a day.  Call our office if your benefit from the Trelegy and we will order through your pharmacy Keep your combivent available to use 2 puffs up to every 6 hours if needed for shortness of breath.  Get the flu shot in the fall Your pneumonia vaccines are up to date.  Follow with Dr Lamonte Sakai in 4 months or sooner if you have any problems.  Chronic respiratory failure continue o2 at 3L/min, O to uptitrate to 5L/min w exertion  Baltazar Apo, MD, PhD 04/29/2018, 12:34 PM [facility] [phone] or if no answer [phone]

## 2018-04-29 NOTE — Patient Instructions (Addendum)
We will temporarily stop Symbicort. We will do a trial of Trelegy, one inhalation once a day.  Call our office if your benefit from the Trelegy and we will order through your pharmacy Keep your combivent available to use 2 puffs up to every 6 hours if needed for shortness of breath.  Get the flu shot in the fall Your pneumonia vaccines are up to date.  Follow with Dr Lamonte Sakai in 4 months or sooner if you have any problems.

## 2018-05-13 DIAGNOSIS — H02055 Trichiasis without entropian left lower eyelid: Secondary | ICD-10-CM | POA: Diagnosis not present

## 2018-05-20 NOTE — Assessment & Plan Note (Signed)
Please continue your oxygen at 2-3 L/min at rest. You will need to increase with exertion, possibly to 4 or 5L. Our goal will be to keep your oxygen saturations greater than 90%.

## 2018-05-20 NOTE — Assessment & Plan Note (Signed)
Please continue Symbicort 2 puffs twice a day We will try adding Spiriva respimat 2 puffs once a day

## 2018-05-24 ENCOUNTER — Other Ambulatory Visit: Payer: Self-pay | Admitting: Family Medicine

## 2018-05-27 DIAGNOSIS — J439 Emphysema, unspecified: Secondary | ICD-10-CM | POA: Diagnosis not present

## 2018-05-30 DIAGNOSIS — R3912 Poor urinary stream: Secondary | ICD-10-CM | POA: Diagnosis not present

## 2018-06-26 DIAGNOSIS — J439 Emphysema, unspecified: Secondary | ICD-10-CM | POA: Diagnosis not present

## 2018-07-10 DIAGNOSIS — R350 Frequency of micturition: Secondary | ICD-10-CM | POA: Diagnosis not present

## 2018-07-10 DIAGNOSIS — R3915 Urgency of urination: Secondary | ICD-10-CM | POA: Diagnosis not present

## 2018-07-17 ENCOUNTER — Other Ambulatory Visit: Payer: Self-pay

## 2018-07-17 ENCOUNTER — Ambulatory Visit (INDEPENDENT_AMBULATORY_CARE_PROVIDER_SITE_OTHER): Payer: PPO | Admitting: Family Medicine

## 2018-07-17 ENCOUNTER — Encounter: Payer: Self-pay | Admitting: Family Medicine

## 2018-07-17 VITALS — BP 118/62 | HR 84 | Wt 123.0 lb

## 2018-07-17 DIAGNOSIS — Z0000 Encounter for general adult medical examination without abnormal findings: Secondary | ICD-10-CM | POA: Diagnosis not present

## 2018-07-17 DIAGNOSIS — J9611 Chronic respiratory failure with hypoxia: Secondary | ICD-10-CM | POA: Diagnosis not present

## 2018-07-17 DIAGNOSIS — E039 Hypothyroidism, unspecified: Secondary | ICD-10-CM

## 2018-07-17 DIAGNOSIS — J438 Other emphysema: Secondary | ICD-10-CM

## 2018-07-17 DIAGNOSIS — E7800 Pure hypercholesterolemia, unspecified: Secondary | ICD-10-CM | POA: Diagnosis not present

## 2018-07-17 DIAGNOSIS — Z8546 Personal history of malignant neoplasm of prostate: Secondary | ICD-10-CM

## 2018-07-17 LAB — COMPLETE METABOLIC PANEL WITH GFR
AG Ratio: 1.8 (calc) (ref 1.0–2.5)
ALT: 17 U/L (ref 9–46)
AST: 22 U/L (ref 10–35)
Albumin: 4.2 g/dL (ref 3.6–5.1)
Alkaline phosphatase (APISO): 49 U/L (ref 40–115)
BUN: 19 mg/dL (ref 7–25)
CO2: 30 mmol/L (ref 20–32)
Calcium: 9.7 mg/dL (ref 8.6–10.3)
Chloride: 101 mmol/L (ref 98–110)
Creat: 0.91 mg/dL (ref 0.70–1.11)
GFR, Est African American: 88 mL/min/{1.73_m2} (ref >=60)
GFR, Est Non African American: 76 mL/min/{1.73_m2} (ref >=60)
Globulin: 2.3 g/dL (calc) (ref 1.9–3.7)
Glucose, Bld: 86 mg/dL (ref 65–99)
Potassium: 5 mmol/L (ref 3.5–5.3)
Sodium: 139 mmol/L (ref 135–146)
Total Bilirubin: 0.5 mg/dL (ref 0.2–1.2)
Total Protein: 6.5 g/dL (ref 6.1–8.1)

## 2018-07-17 LAB — CBC WITH DIFFERENTIAL/PLATELET
Basophils Absolute: 30 cells/uL (ref 0–200)
Basophils Relative: 0.5 %
Eosinophils Absolute: 254 cells/uL (ref 15–500)
Eosinophils Relative: 4.3 %
HCT: 39.1 % (ref 38.5–50.0)
Hemoglobin: 13.2 g/dL (ref 13.2–17.1)
Lymphs Abs: 1422 cells/uL (ref 850–3900)
MCH: 29.5 pg (ref 27.0–33.0)
MCHC: 33.8 g/dL (ref 32.0–36.0)
MCV: 87.5 fL (ref 80.0–100.0)
MPV: 9.9 fL (ref 7.5–12.5)
Monocytes Relative: 12.3 %
Neutro Abs: 3469 cells/uL (ref 1500–7800)
Neutrophils Relative %: 58.8 %
Platelets: 297 10*3/uL (ref 140–400)
RBC: 4.47 10*6/uL (ref 4.20–5.80)
RDW: 13.2 % (ref 11.0–15.0)
Total Lymphocyte: 24.1 %
WBC mixed population: 726 cells/uL (ref 200–950)
WBC: 5.9 10*3/uL (ref 3.8–10.8)

## 2018-07-17 LAB — LIPID PANEL
Cholesterol: 227 mg/dL — ABNORMAL HIGH (ref <200)
HDL: 85 mg/dL (ref >40)
LDL Cholesterol (Calc): 126 mg/dL (calc) — ABNORMAL HIGH
Non-HDL Cholesterol (Calc): 142 mg/dL (calc) — ABNORMAL HIGH (ref <130)
Total CHOL/HDL Ratio: 2.7 (calc) (ref <5.0)
Triglycerides: 72 mg/dL (ref <150)

## 2018-07-17 LAB — TSH: TSH: 1.96 mIU/L (ref 0.40–4.50)

## 2018-07-17 NOTE — Progress Notes (Signed)
Subjective:    Patient ID: John Black, male    DOB: [birth year], 84 y.o.   [name]: [id number]  HPI Patient is here today for complete physical exam.  He normally sees my partner Dr. Buelah Manis.  He is a very pleasant 84 year old white male with a history of chronic respiratory failure secondary to emphysema on chronic oxygen.  Otherwise he is doing exceptionally well.  He denies any difficulty with memory.  His functional assessment is essentially normal outside of some mild balance issues they do not even require a cane and his dependence on oxygen which does limit his mobility.  He denies any problems with depression or anxiety.  He has a history of prostate cancer.  However he sees a urologist.  Over the last few years they have not even checked his PSA.  We discussed the risk and benefits of checking this and I have recommended not to recheck a PSA today due to his age she does not require colonoscopy.  He is due for lab work to monitor his thyroid as well as his cholesterol.  He recently discontinued his statin due to myalgias and his cholesterol this spring was slightly elevated.  He is due to follow this.  Physicians are up-to-date except for the shingles vaccine, Shingrix.  He had his flu shot at [facility]. Immunization History  Administered Date(s) Administered  . Influenza Whole 05/30/2011, 06/17/2012  . Influenza, High Dose Seasonal PF 05/28/2017  . Influenza,inj,Quad PF,6+ Mos 05/28/2013, 06/10/2014, 06/17/2015, 06/22/2016  . Pneumococcal Conjugate-13 07/20/2014  . Pneumococcal Polysaccharide-23 06/19/2012   Past Medical History:  Diagnosis Date  . Allergic rhinitis   . Cataract    s/p removal  . Chronic respiratory failure (HCC)    oxygen 3L at home  . Emphysema   . Hypothyroid   . PNA (pneumonia)   . Prostate cancer [facility])    Past Surgical History:  Procedure Laterality Date  . ADENOIDECTOMY    . ROTATOR CUFF REPAIR  1990,2009   bilateral  . seed implant for prostate cancer   2000  . TONSILLECTOMY    . TRANSURETHRAL RESECTION OF PROSTATE  2001   x2   Current Outpatient Medications on File Prior to Visit  Medication Sig Dispense Refill  . albuterol (PROVENTIL HFA;VENTOLIN HFA) 108 (90 Base) MCG/ACT inhaler Inhale 2 puffs into the lungs every 6 (six) hours as needed for wheezing or shortness of breath. 3 Inhaler 3  . albuterol (PROVENTIL) (2.5 MG/3ML) 0.083% nebulizer solution Take 3 mLs (2.5 mg total) by nebulization every 6 (six) hours as needed for wheezing or shortness of breath. 150 mL 12  . Calcium-Magnesium-Vitamin D (CITRACAL CALCIUM+D PO) Take 1 tablet by mouth every morning.     . Fluticasone-Umeclidin-Vilant (TRELEGY ELLIPTA) 100-62.5-25 MCG/INH AEPB Inhale 1 puff into the lungs daily. 28 each 0  . ibuprofen (ADVIL,MOTRIN) 400 MG tablet Take 400 mg by mouth every 6 (six) hours as needed for fever, headache or mild pain.     . Ipratropium-Albuterol (COMBIVENT RESPIMAT) 20-100 MCG/ACT AERS respimat Inhale 1 puff into the lungs every 6 (six) hours.    . Ipratropium-Albuterol (COMBIVENT) 20-100 MCG/ACT AERS respimat Inhale 1 puff into the lungs every 6 (six) hours. 3 Inhaler 4  . levothyroxine (SYNTHROID, LEVOTHROID) 75 MCG tablet TAKE 1 TABLET (75 MCG TOTAL) BY MOUTH DAILY. 90 tablet 3  . LORazepam (ATIVAN) 0.5 MG tablet TAKE 1 TABLET BY MOUTH TWICE A DAY AS NEEDED 30 tablet 2  . SYMBICORT 160-4.5 MCG/ACT inhaler  INHALE 2 PUFFS INTO THE LUNGS 2 (TWO) TIMES DAILY. 10.2 Inhaler 5  . tamsulosin (FLOMAX) 0.4 MG CAPS capsule Take 0.4 capsules by mouth daily.    . traMADol (ULTRAM) 50 MG tablet TAKE 1 TABLET BY MOUTH EVERY 8 HOURS AS NEEDED FOR PAIN 60 tablet 2  . zolpidem (AMBIEN) 10 MG tablet TAKE 1/2 TO 1 TABLET AT BEDTIME AS NEEDED FOR INSOMNIA 90 tablet 1   No current facility-administered medications on file prior to visit.    Allergies  Allergen Reactions  . Morphine     REACTION: sweats   Social History   Socioeconomic History  . Marital status:  Married    Spouse name: Not on file  . Number of children: Not on file  . Years of education: Not on file  . Highest education level: Not on file  Occupational History  . Occupation: Chief Financial Officer  Social Needs  . Financial resource strain: Not on file  . Food insecurity:    Worry: Not on file    Inability: Not on file  . Transportation needs:    Medical: Not on file    Non-medical: Not on file  Tobacco Use  . Smoking status: Former Smoker    Packs/day: 1.50    Years: 50.00    Pack years: 75.00    Types: Cigarettes    Last attempt to quit: 09/17/2008    Years since quitting: 9.8  . Smokeless tobacco: Never Used  Substance and Sexual Activity  . Alcohol use: Yes    Alcohol/week: 1.0 standard drinks    Types: 1 Glasses of wine per week    Comment: each evening  . Drug use: No  . Sexual activity: Never  Lifestyle  . Physical activity:    Days per week: Not on file    Minutes per session: Not on file  . Stress: Not on file  Relationships  . Social connections:    Talks on phone: Not on file    Gets together: Not on file    Attends religious service: Not on file    Active member of club or organization: Not on file    Attends meetings of clubs or organizations: Not on file    Relationship status: Not on file  . Intimate partner violence:    Fear of current or ex partner: Not on file    Emotionally abused: Not on file    Physically abused: Not on file    Forced sexual activity: Not on file  Other Topics Concern  . Not on file  Social History Narrative  . Not on file   Family History  Problem Relation Age of Onset  . Heart disease Mother   . Prostate cancer Father       Review of Systems  All other systems reviewed and are negative.      Objective:   Physical Exam  Constitutional: He is oriented to person, place, and time. He appears well-developed and well-nourished. No distress.  HENT:  Head: Normocephalic and atraumatic.  Right Ear: External ear normal.    Left Ear: External ear normal.  Nose: Nose normal.  Mouth/Throat: Oropharynx is clear and moist. No oropharyngeal exudate.  Eyes: Pupils are equal, round, and reactive to light. Conjunctivae and EOM are normal. Right eye exhibits no discharge. Left eye exhibits no discharge. No scleral icterus.  Neck: Normal range of motion. Neck supple. No JVD present. No tracheal deviation present. No thyromegaly present.  Cardiovascular: Normal rate, regular rhythm, normal  heart sounds and intact distal pulses. Exam reveals no gallop and no friction rub.  No murmur heard. Pulmonary/Chest: Effort normal and breath sounds normal. No stridor. No respiratory distress. He has no wheezes. He has no rales. He exhibits no tenderness.  Abdominal: Soft. Bowel sounds are normal. He exhibits no distension and no mass. There is no tenderness. There is no rebound and no guarding. No hernia.  Musculoskeletal: He exhibits no edema.  Lymphadenopathy:    He has no cervical adenopathy.  Neurological: He is alert and oriented to person, place, and time. He displays normal reflexes. No cranial nerve deficit. He exhibits normal muscle tone. Coordination normal.  Skin: No rash noted. He is not diaphoretic. No erythema. No pallor.  Psychiatric: He has a normal mood and affect. His behavior is normal. Judgment and thought content normal.  Vitals reviewed.         Assessment & Plan:  Routine general medical examination at a health care facility - Plan: CBC with Differential/Platelet, COMPLETE METABOLIC PANEL WITH GFR, Lipid panel, TSH  Hypothyroidism, unspecified type - Plan: TSH  History of prostate cancer  Chronic respiratory failure with hypoxia (HCC)  Other emphysema (HCC)  Pure hypercholesterolemia - Plan: COMPLETE METABOLIC PANEL WITH GFR, Lipid panel  I am extremely impressed by this patient given his age.  His mobility is excellent despite being on oxygen.  He denies any problems with depression.  His memory  assessment is completely normal.  I will check a TSH to monitor his hypothyroidism.  I will check a CBC, CMP, and a fasting lipid panel.  Immunizations are up-to-date.  I did recommend the shingles vaccine if reasonably priced.  Due to his age, I recommended against screening for prostate cancer or colon cancer.  Regular anticipatory guidance is provided.

## 2018-07-18 ENCOUNTER — Telehealth: Payer: Self-pay

## 2018-07-18 NOTE — Telephone Encounter (Signed)
Patient aware of lab results.  Copy mailed per request.

## 2018-07-18 NOTE — Telephone Encounter (Signed)
-----   Message from Susy Frizzle, MD sent at 07/18/2018  6:42 AM EDT ----- Labs are outstanding.  Cholesterol is mildly elevated but not significantly elevated enough to justify medication at this time

## 2018-07-27 DIAGNOSIS — J439 Emphysema, unspecified: Secondary | ICD-10-CM | POA: Diagnosis not present

## 2018-07-29 ENCOUNTER — Other Ambulatory Visit: Payer: Self-pay | Admitting: Family Medicine

## 2018-07-29 NOTE — Telephone Encounter (Signed)
Ok to refill??  Last office visit 07/17/2018.  Last refill 04/01/2018, #2 refills.

## 2018-08-28 ENCOUNTER — Other Ambulatory Visit: Payer: Self-pay | Admitting: Family Medicine

## 2018-08-28 NOTE — Telephone Encounter (Signed)
Ok to refill??  Last office visit 07/17/2018.  Last refill 03/07/2018, #1 refill.

## 2018-09-19 ENCOUNTER — Other Ambulatory Visit: Payer: Self-pay | Admitting: *Deleted

## 2018-09-19 MED ORDER — BUDESONIDE-FORMOTEROL FUMARATE 160-4.5 MCG/ACT IN AERO: 2.0000 | INHALATION_SPRAY | Freq: Two times a day (BID) | RESPIRATORY_TRACT | 2 refills | Status: DC

## 2018-09-26 DIAGNOSIS — J439 Emphysema, unspecified: Secondary | ICD-10-CM | POA: Diagnosis not present

## 2018-09-30 ENCOUNTER — Encounter: Payer: Self-pay | Admitting: Emergency Medicine

## 2018-09-30 ENCOUNTER — Ambulatory Visit: Payer: PPO | Admitting: Emergency Medicine

## 2018-09-30 DIAGNOSIS — J9611 Chronic respiratory failure with hypoxia: Secondary | ICD-10-CM

## 2018-09-30 DIAGNOSIS — J438 Other emphysema: Secondary | ICD-10-CM | POA: Diagnosis not present

## 2018-09-30 MED ORDER — PREDNISONE 10 MG PO TABS: ORAL_TABLET | ORAL | 0 refills | Status: DC

## 2018-09-30 NOTE — Assessment & Plan Note (Signed)
He desaturated with ambulation into the office on pulse flow today.  I suspect that we may need to change him over to continuous flow at all times.  This may be problematic since he prefers that he portable oxygen concentrator that only provides pulsed.  I will walk him again next visit to see if the pulsed flow is adequate.  If not then we may need to consider changing him over  Please continue oxygen at 3 to 4 L/min depending on your level of activity

## 2018-09-30 NOTE — Progress Notes (Signed)
HPI:  ROV 04/29/18 --[name] is an 85 year old gentleman with a history of tobacco use and severe COPD.  He has chronic hypoxemic respiratory failure.  He is maintained on Symbicort.  We tried adding Spiriva Respimat in the past but he did not feel any benefit so he did not continue it.  He has Combivent to use as needed, uses it approximately 2-3x a day. He is using his O2 at 3-4 L/min. He has a new POC. He believes that his breathing is stable, still has same exertional dyspnea. No flares since last time. He reports some diarrhea for the last couple months.   ROV 09/30/18 --85 year old gentleman who follows up today for his history of severe COPD and associated chronic hypoxemic respiratory failure.  At his last visit I tried changing Symbicort to Trelegy to see if he would get benefit. He reports that it wasn't really any better than Symbicort, so he went back to symbicort. He is feeling well, but does have episodic dyspnea, difficulty getting from room to room - has seen some desaturations. He is using pulsed O2 3-4L/min when out, continuous at home.  He uses Combivent as needed, approximately 0-1x a day. Denies any wheeze, chest pain, sputum production.     No flowsheet data found.   Vitals:   09/30/18 1212  BP: (!) 164/84  Pulse: (!) 114  SpO2: 90%  Weight: 123 lb (55.8 kg)  Height: 5\' 4"  (1.626 m)   Gen: Pleasant, thin man, in no distress on O2,  normal affect  ENT: No lesions,  mouth clear,  oropharynx clear, no postnasal drip  Neck: No JVD, no stridor  Lungs: No use of accessory muscles, good air movement, few scattered exp wheezes.   Cardiovascular: RRR, heart sounds normal, no murmur or gallops, no peripheral edema  Musculoskeletal: No deformities, no cyanosis or clubbing  Neuro: alert, non focal  Skin: Warm, no lesions or rashes   COPD (chronic obstructive pulmonary disease) with emphysema This change in his status appears to be more subacute as opposed to an acute  exacerbation.  He has never benefited from a [place], including recent Trelegy.  I wonder whether he may require chronic daily prednisone.  I will treat him with a prednisone taper now, see if he gets clinical benefit.  If so then we can discuss possible chronic prednisone therapy  We will investigate changing her Symbicort to generic formulation.  We will call your pharmacy and if possible make this change.  Continue 2 puffs twice daily.  Rinse and gargle after using. Please take prednisone as directed.  Keep track of whether you benefit while you are on this medication.  We are going to discuss possibly starting low-dose prednisone at your next visit. Follow with Dr Lamonte Sakai in 1 month or next available  Chronic respiratory failure He desaturated with ambulation into the office on pulse flow today.  I suspect that we may need to change him over to continuous flow at all times.  This may be problematic since he prefers that he portable oxygen concentrator that only provides pulsed.  I will walk him again next visit to see if the pulsed flow is adequate.  If not then we may need to consider changing him over  Please continue oxygen at 3 to 4 L/min depending on your level of activity  Baltazar Apo, MD, PhD 09/30/2018, 12:42 PM [facility] [phone] or if no answer [phone]

## 2018-09-30 NOTE — Patient Instructions (Signed)
We will investigate changing her Symbicort to generic formulation.  We will call your pharmacy and if possible make this change.  Continue 2 puffs twice daily.  Rinse and gargle after using. Please continue oxygen at 3 to 4 L/min depending on your level of activity Please take prednisone as directed.  Keep track of whether you benefit while you are on this medication.  We are going to discuss possibly starting low-dose prednisone at your next visit. Follow with Dr Lamonte Sakai in 1 month or next available

## 2018-09-30 NOTE — Assessment & Plan Note (Addendum)
This change in his status appears to be more subacute as opposed to an acute exacerbation.  He has never benefited from a [place], including recent Trelegy.  I wonder whether he may require chronic daily prednisone.  I will treat him with a prednisone taper now, see if he gets clinical benefit.  If so then we can discuss possible chronic prednisone therapy  We will investigate changing her Symbicort to generic formulation.  We will call your pharmacy and if possible make this change.  Continue 2 puffs twice daily.  Rinse and gargle after using. Please take prednisone as directed.  Keep track of whether you benefit while you are on this medication.  We are going to discuss possibly starting low-dose prednisone at your next visit. Follow with Dr Lamonte Sakai in 1 month or next available

## 2018-10-10 ENCOUNTER — Telehealth: Payer: Self-pay | Admitting: Emergency Medicine

## 2018-10-10 NOTE — Telephone Encounter (Signed)
I'd like to see how he does off of it - see which sx recur if any. Then make decision a little later about whether to use a low dose long term.

## 2018-10-10 NOTE — Telephone Encounter (Signed)
Patient states that he is doing really well at this time he has completed the Prednisone taper. He states he felt so much benefit from being on it and wonders if he should continue on it or wait until next ov to discus this.  Dr. Lamonte Sakai please advise.

## 2018-10-10 NOTE — Telephone Encounter (Signed)
Patient is aware of this nothing further is needed at this time. 

## 2018-10-17 ENCOUNTER — Telehealth: Payer: Self-pay | Admitting: *Deleted

## 2018-10-17 MED ORDER — NYSTATIN 100000 UNIT/ML MT SUSP: 5.0000 mL | Freq: Four times a day (QID) | OROMUCOSAL | 0 refills | Status: DC

## 2018-10-17 NOTE — Telephone Encounter (Signed)
Received call from patient.   Reports that he has soreness and irritation to tongue and mouth.   Patient spouse looked in mouth and reports that there is thick white coating to back of throat, and tongue resembles "road map."   Advised to increase water intake. Also sent prescription for Nystatin.   Advised if S/Sx worsen or do not improve over WE, schedule appointment for evaluation.

## 2018-10-27 DIAGNOSIS — J439 Emphysema, unspecified: Secondary | ICD-10-CM | POA: Diagnosis not present

## 2018-11-03 ENCOUNTER — Encounter: Payer: Self-pay | Admitting: Emergency Medicine

## 2018-11-03 ENCOUNTER — Ambulatory Visit: Payer: PPO | Admitting: Emergency Medicine

## 2018-11-03 DIAGNOSIS — J9611 Chronic respiratory failure with hypoxia: Secondary | ICD-10-CM | POA: Diagnosis not present

## 2018-11-03 DIAGNOSIS — J438 Other emphysema: Secondary | ICD-10-CM | POA: Diagnosis not present

## 2018-11-03 MED ORDER — PREDNISONE 10 MG PO TABS: 15.0000 mg | ORAL_TABLET | Freq: Every day | ORAL | 5 refills | Status: DC

## 2018-11-03 NOTE — Patient Instructions (Addendum)
We will try starting prednisone 15mg  once a day to see if you benefit.  Continue your Symbicort 2 puffs twice a day. Rinse and gargle after using.  Keep your Combivent available to use 2 puffs up to every 6 hours if needed for shortness of breath, chest tightness, wheezing. Continue your oxygen at 3 to 4 L/min.  Follow with Dr. Lamonte Sakai in 2 months to assess your status.

## 2018-11-03 NOTE — Assessment & Plan Note (Signed)
He desaturates on pulse flow.  He was desaturated to 85% today even on 4 L pulsed.  He qualifies for submental oxygen, may need to consider changing him to a continuous flow system at all times.  He is resistant to this because he likes the convenience of the [employer]

## 2018-11-03 NOTE — Assessment & Plan Note (Signed)
Severe disease.  He seemed to benefit from the recent prednisone taper.  His wife definitely noticed an improvement in exertional tolerance.  He is open to trying daily prednisone to see if he gets longer-term benefit.  I will start 15 mg daily and we will adjust based on response.  Continue his current bronchodilator regimen.  Again I considered making his Combivent scheduled since he is not on a scheduled LAMA.

## 2018-11-03 NOTE — Progress Notes (Signed)
HPI:  ROV 04/29/18 --Mr. Cornell Barman is an 85 year old gentleman with a history of tobacco use and severe COPD.  He has chronic hypoxemic respiratory failure.  He is maintained on Symbicort.  We tried adding Spiriva Respimat in the past but he did not feel any benefit so he did not continue it.  He has Combivent to use as needed, uses it approximately 2-3x a day. He is using his O2 at 3-4 L/min. He has a new POC. He believes that his breathing is stable, still has same exertional dyspnea. No flares since last time. He reports some diarrhea for the last couple months.   ROV 09/30/18 --85 year old gentleman who follows up today for his history of severe COPD and associated chronic hypoxemic respiratory failure.  At his last visit I tried changing Symbicort to Trelegy to see if he would get benefit. He reports that it wasn't really any better than Symbicort, so he went back to symbicort. He is feeling well, but does have episodic dyspnea, difficulty getting from room to room - has seen some desaturations. He is using pulsed O2 3-4L/min when out, continuous at home.  He uses Combivent as needed, approximately 0-1x a day. Denies any wheeze, chest pain, sputum production.   ROV 11/03/2018 --85 year old man who follows up today for chronic hypoxemic respiratory failure in the setting of severe COPD.  He is currently managed on Symbicort, has never benefited from [place]. Has combivent, uses prn.  He is on 3 to 4 L/min, typically uses pulsed flow even though he clearly desaturates on the pulse system.  At his last visit I gave him a prednisone taper to see if he would get clinical benefit.  We did this in anticipation that he might benefit from chronic low-dose steroids. He noted a significant benefit on the pred taper, felt that his breathing was better. He had a better appetite, more energy.   No flowsheet data found.   Vitals:   11/03/18 1534  BP: 132/72  Pulse: 100  SpO2: 94%  Weight: 122 lb 12.8 oz (55.7 kg)   Height: 5\' 4"  (1.626 m)   Gen: Pleasant, thin man, in no distress on O2,  normal affect  ENT: No lesions,  mouth clear,  oropharynx clear, no postnasal drip  Neck: No JVD, no stridor  Lungs: No use of accessory muscles, good air movement, few scattered exp wheezes.   Cardiovascular: RRR, heart sounds normal, no murmur or gallops, no peripheral edema  Musculoskeletal: No deformities, no cyanosis or clubbing  Neuro: alert, non focal  Skin: Warm, no lesions or rashes   COPD (chronic obstructive pulmonary disease) with emphysema Severe disease.  He seemed to benefit from the recent prednisone taper.  His wife definitely noticed an improvement in exertional tolerance.  He is open to trying daily prednisone to see if he gets longer-term benefit.  I will start 15 mg daily and we will adjust based on response.  Continue his current bronchodilator regimen.  Again I considered making his Combivent scheduled since he is not on a scheduled LAMA.   Chronic respiratory failure He desaturates on pulse flow.  He was desaturated to 85% today even on 4 L pulsed.  He qualifies for submental oxygen, may need to consider changing him to a continuous flow system at all times.  He is resistant to this because he likes the convenience of the [employer]  Baltazar Apo, MD, PhD 11/03/2018, 3:53 PM  [facility] [phone] or if no answer [phone]

## 2018-11-08 ENCOUNTER — Other Ambulatory Visit: Payer: Self-pay | Admitting: Emergency Medicine

## 2018-11-11 ENCOUNTER — Other Ambulatory Visit: Payer: Self-pay

## 2018-11-11 ENCOUNTER — Encounter: Payer: Self-pay | Admitting: Family Medicine

## 2018-11-11 ENCOUNTER — Ambulatory Visit (INDEPENDENT_AMBULATORY_CARE_PROVIDER_SITE_OTHER): Payer: PPO | Admitting: Family Medicine

## 2018-11-11 ENCOUNTER — Other Ambulatory Visit: Payer: Self-pay | Admitting: *Deleted

## 2018-11-11 VITALS — BP 130/66 | HR 68 | Temp 99.1°F | Resp 20 | Ht 64.0 in | Wt 122.8 lb

## 2018-11-11 DIAGNOSIS — R1013 Epigastric pain: Secondary | ICD-10-CM

## 2018-11-11 DIAGNOSIS — E860 Dehydration: Secondary | ICD-10-CM

## 2018-11-11 LAB — CBC WITH DIFFERENTIAL/PLATELET
Absolute Monocytes: 1214 cells/uL — ABNORMAL HIGH (ref 200–950)
Basophils Absolute: 24 cells/uL (ref 0–200)
Basophils Relative: 0.2 %
Eosinophils Absolute: 24 cells/uL (ref 15–500)
Eosinophils Relative: 0.2 %
HCT: 46.4 % (ref 38.5–50.0)
Hemoglobin: 15.7 g/dL (ref 13.2–17.1)
Lymphs Abs: 904 cells/uL (ref 850–3900)
MCH: 30.1 pg (ref 27.0–33.0)
MCHC: 33.8 g/dL (ref 32.0–36.0)
MCV: 89.1 fL (ref 80.0–100.0)
MPV: 10.2 fL (ref 7.5–12.5)
Monocytes Relative: 10.2 %
Neutro Abs: 9734 cells/uL — ABNORMAL HIGH (ref 1500–7800)
Neutrophils Relative %: 81.8 %
Platelets: 385 10*3/uL (ref 140–400)
RBC: 5.21 10*6/uL (ref 4.20–5.80)
RDW: 13.1 % (ref 11.0–15.0)
Total Lymphocyte: 7.6 %
WBC: 11.9 10*3/uL — ABNORMAL HIGH (ref 3.8–10.8)

## 2018-11-11 LAB — COMPREHENSIVE METABOLIC PANEL
AG Ratio: 1.8 (calc) (ref 1.0–2.5)
ALT: 20 U/L (ref 9–46)
AST: 21 U/L (ref 10–35)
Albumin: 4.8 g/dL (ref 3.6–5.1)
Alkaline phosphatase (APISO): 61 U/L (ref 35–144)
BUN: 23 mg/dL (ref 7–25)
CO2: 35 mmol/L — ABNORMAL HIGH (ref 20–32)
Calcium: 11.3 mg/dL — ABNORMAL HIGH (ref 8.6–10.3)
Chloride: 96 mmol/L — ABNORMAL LOW (ref 98–110)
Creat: 0.88 mg/dL (ref 0.70–1.11)
Globulin: 2.7 g/dL (calc) (ref 1.9–3.7)
Glucose, Bld: 131 mg/dL — ABNORMAL HIGH (ref 65–99)
Potassium: 4.5 mmol/L (ref 3.5–5.3)
Sodium: 141 mmol/L (ref 135–146)
Total Bilirubin: 0.8 mg/dL (ref 0.2–1.2)
Total Protein: 7.5 g/dL (ref 6.1–8.1)

## 2018-11-11 LAB — LIPASE: Lipase: 19 U/L (ref 7–60)

## 2018-11-11 MED ORDER — DEXLANSOPRAZOLE 60 MG PO CPDR: 60.0000 mg | DELAYED_RELEASE_CAPSULE | Freq: Every day | ORAL | 0 refills | Status: DC

## 2018-11-11 NOTE — Patient Instructions (Signed)
Take the dexilant once a a day  Do not take the steroids until we call with lab results F/U pending results

## 2018-11-11 NOTE — Progress Notes (Signed)
Subjective:    Patient ID: John Black, male    DOB: Feb 24, 1929, 85 y.o.   [name]: [id number]  Patient presents for Illness (x1 days- upper abd pain- constant stomach ache and nausea, loss of appetite, loose stool during night- has tried antacid, pepto, and gas-x without reilef)   Pt here with epigastric abdominal pain, started yesterday. + Nausea no vomiting, no fever, .  At 1 loose stool but not diarrhea in the middle of the night has not had any further stools.  His epigastric pain is a little bit better today.  He did try drinking some coffee that cause pain.  Yesterday he took Gas-X and acid Pepto-Bismol with no improvement in his pain.  He did not eat anything after lunchtime is not really had anything to drink otherwise.  He does feel like he has cottonmouth.  He has not had any fever no URI symptoms.  He is currently on prednisone 15 mg daily he completed a prednisone taper a couple weeks ago then returned to his pulmonologist as he did compensated after stopping the steroids and he is now on the daily dose.  He is using his oxygen at baseline.  No known sick contacts. No UTI symptoms  Review Of Systems:  GEN- denies fatigue, fever, weight loss,weakness, recent illness HEENT- denies eye drainage, change in vision, nasal discharge, CVS- denies chest pain, palpitations RESP- denies SOB, cough, wheeze ABD- denies N/V, change in stools, abd pain GU- denies dysuria, hematuria, dribbling, incontinence MSK- denies joint pain, muscle aches, injury Neuro- denies headache, dizziness, syncope, seizure activity       Objective:    BP 130/66   Pulse 68   Temp 99.1 F (37.3 C) (Oral)   Resp 20   Ht 5\' 4"  (1.626 m)   Wt 122 lb 12.8 oz (55.7 kg)   SpO2 94% Comment: 3 L/min via   BMI 21.08 kg/m  GEN- NAD, alert and oriented x3 HEENT- PERRL, EOMI, non injected sclera, pink conjunctiva, dry MM oropharynx clear Neck- Supple, no LAD CVS- RRR, no murmur RESP-CTAB, wearing  oxygen ABD-NABS,soft,very mild TTP epigastric, no rebound, no guarding , no CVA tenderness ,ND EXT- No edema Pulses- Radial 2+        Assessment & Plan:      Problem List Items Addressed This Visit    None    Visit Diagnoses    Dehydration    -  Primary   Given IVF 1 liter bolus in office, he felt much better, improved color and energy. Overall fairly benign abd exam, no red flags, STAT labs showed mildly elevated blood cell count at 11.9 this is most likely secondary to the prednisone that he has been on.  He does not have any fever.  Lipase was normal metabolic panel besides calcium was normal.  Differentials include gastritis possible early viral enteritis which he is just not completely himself.  Doubt pancreatitis based on the labs.  No sign of true colitis.  No sign of gallbladder etiology at this time.  When to give him samples of Dexilant to try at home to cover for gastritis.  We will hold the prednisone for 3 days this may be contributed to some the GI upset.  We will have his labs repeated in 48 hours for the elevated calcium level this may just been secondary to the dehydration.  He is also however going to hold any calcium supplements at this is not continue to go up.  He was  given instructions if he gets worsening abdominal pain starts having vomiting episodes has severe diarrhea he is to go to the emergency room for CT of abdomen   Epigastric abdominal pain       Relevant Orders   CBC with Differential/Platelet (Completed)   Comprehensive metabolic panel (Completed)   Lipase (Completed)      Note: This dictation was prepared with Dragon dictation along with smaller phrase technology. Any transcriptional errors that result from this process are unintentional.

## 2018-11-11 NOTE — Progress Notes (Signed)
IV Fluids given for dehydration.   Patient noted to have discoloration to R arm after removing IV catheter. Noted bleeding under skin. Pressure dressing applied and advised to keep on for 1 hr.

## 2018-11-12 ENCOUNTER — Emergency Department (HOSPITAL_COMMUNITY)
Admission: EM | Admit: 2018-11-12 | Discharge: 2018-11-12 | Disposition: A | Discharge status: Home / Self Care | Payer: PPO | Attending: Emergency Medicine | Admitting: Emergency Medicine

## 2018-11-12 ENCOUNTER — Emergency Department (HOSPITAL_COMMUNITY): Payer: PPO

## 2018-11-12 ENCOUNTER — Telehealth: Payer: Self-pay | Admitting: *Deleted

## 2018-11-12 ENCOUNTER — Other Ambulatory Visit: Payer: Self-pay

## 2018-11-12 ENCOUNTER — Encounter (HOSPITAL_COMMUNITY): Payer: Self-pay

## 2018-11-12 DIAGNOSIS — Z79899 Other long term (current) drug therapy: Secondary | ICD-10-CM | POA: Insufficient documentation

## 2018-11-12 DIAGNOSIS — E039 Hypothyroidism, unspecified: Secondary | ICD-10-CM | POA: Insufficient documentation

## 2018-11-12 DIAGNOSIS — Z87891 Personal history of nicotine dependence: Secondary | ICD-10-CM | POA: Insufficient documentation

## 2018-11-12 DIAGNOSIS — K4090 Unilateral inguinal hernia, without obstruction or gangrene, not specified as recurrent: Secondary | ICD-10-CM | POA: Diagnosis not present

## 2018-11-12 DIAGNOSIS — K8020 Calculus of gallbladder without cholecystitis without obstruction: Secondary | ICD-10-CM | POA: Diagnosis not present

## 2018-11-12 DIAGNOSIS — R1013 Epigastric pain: Secondary | ICD-10-CM | POA: Diagnosis not present

## 2018-11-12 DIAGNOSIS — J439 Emphysema, unspecified: Secondary | ICD-10-CM | POA: Diagnosis not present

## 2018-11-12 DIAGNOSIS — K2970 Gastritis, unspecified, without bleeding: Secondary | ICD-10-CM | POA: Diagnosis not present

## 2018-11-12 DIAGNOSIS — R1010 Upper abdominal pain, unspecified: Secondary | ICD-10-CM | POA: Diagnosis present

## 2018-11-12 DIAGNOSIS — K56609 Unspecified intestinal obstruction, unspecified as to partial versus complete obstruction: Secondary | ICD-10-CM | POA: Insufficient documentation

## 2018-11-12 LAB — COMPREHENSIVE METABOLIC PANEL
ALT: 17 U/L (ref 0–44)
AST: 23 U/L (ref 15–41)
Albumin: 4.1 g/dL (ref 3.5–5.0)
Alkaline Phosphatase: 51 U/L (ref 38–126)
Anion gap: 10 (ref 5–15)
BUN: 23 mg/dL (ref 8–23)
CO2: 31 mmol/L (ref 22–32)
Calcium: 9.7 mg/dL (ref 8.9–10.3)
Chloride: 96 mmol/L — ABNORMAL LOW (ref 98–111)
Creatinine, Ser: 0.79 mg/dL (ref 0.61–1.24)
GFR calc Af Amer: >60 mL/min (ref >60)
GFR calc non Af Amer: >60 mL/min (ref >60)
Glucose, Bld: 132 mg/dL — ABNORMAL HIGH (ref 70–99)
Potassium: 4.7 mmol/L (ref 3.5–5.1)
Sodium: 137 mmol/L (ref 135–145)
Total Bilirubin: 1 mg/dL (ref 0.3–1.2)
Total Protein: 6.7 g/dL (ref 6.5–8.1)

## 2018-11-12 LAB — CBC
HCT: 46.5 % (ref 39.0–52.0)
Hemoglobin: 14.4 g/dL (ref 13.0–17.0)
MCH: 28.6 pg (ref 26.0–34.0)
MCHC: 31 g/dL (ref 30.0–36.0)
MCV: 92.4 fL (ref 80.0–100.0)
Platelets: 336 10*3/uL (ref 150–400)
RBC: 5.03 MIL/uL (ref 4.22–5.81)
RDW: 13.9 % (ref 11.5–15.5)
WBC: 12.3 10*3/uL — ABNORMAL HIGH (ref 4.0–10.5)
nRBC: 0 % (ref 0.0–0.2)

## 2018-11-12 LAB — URINALYSIS, ROUTINE W REFLEX MICROSCOPIC
Bacteria, UA: NONE SEEN
Bilirubin Urine: NEGATIVE
Glucose, UA: NEGATIVE mg/dL
Ketones, ur: 20 mg/dL — AB
Leukocytes,Ua: NEGATIVE
Nitrite: NEGATIVE
Protein, ur: NEGATIVE mg/dL
Specific Gravity, Urine: 1.019 (ref 1.005–1.030)
pH: 6 (ref 5.0–8.0)

## 2018-11-12 LAB — LIPASE, BLOOD: Lipase: 27 U/L (ref 11–51)

## 2018-11-12 LAB — TROPONIN I: Troponin I: <0.03 ng/mL (ref <0.03)

## 2018-11-12 IMAGING — DX DG CHEST 2V
2 series · 2 of 2 positions shown · non-contrast
Comparison: PA and lateral chest [DATE].  CT chest [DATE].

CLINICAL DATA: Worsening abdominal pain since [DATE].

EXAM:
CHEST - 2 VIEW

[chest pa]
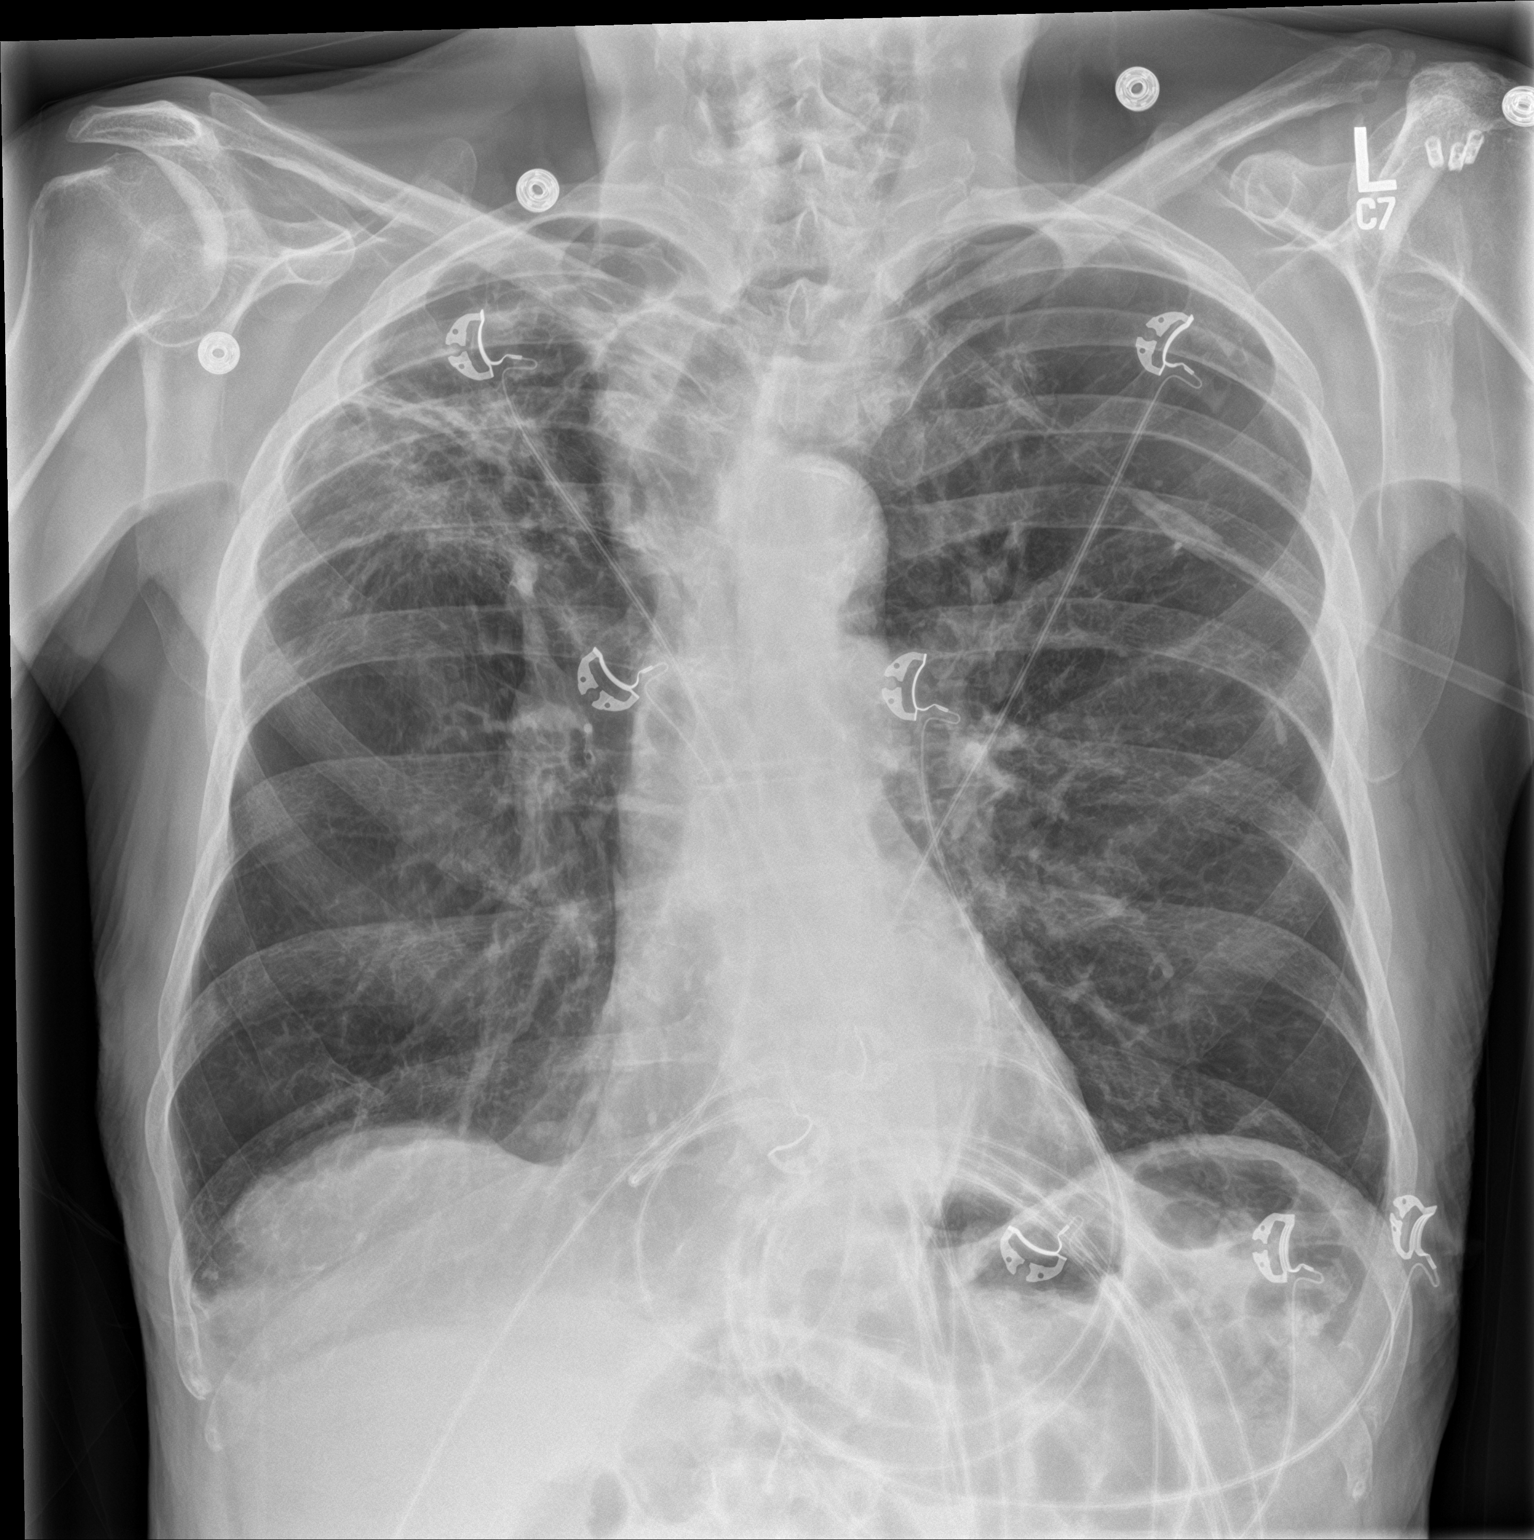

[chest lat]
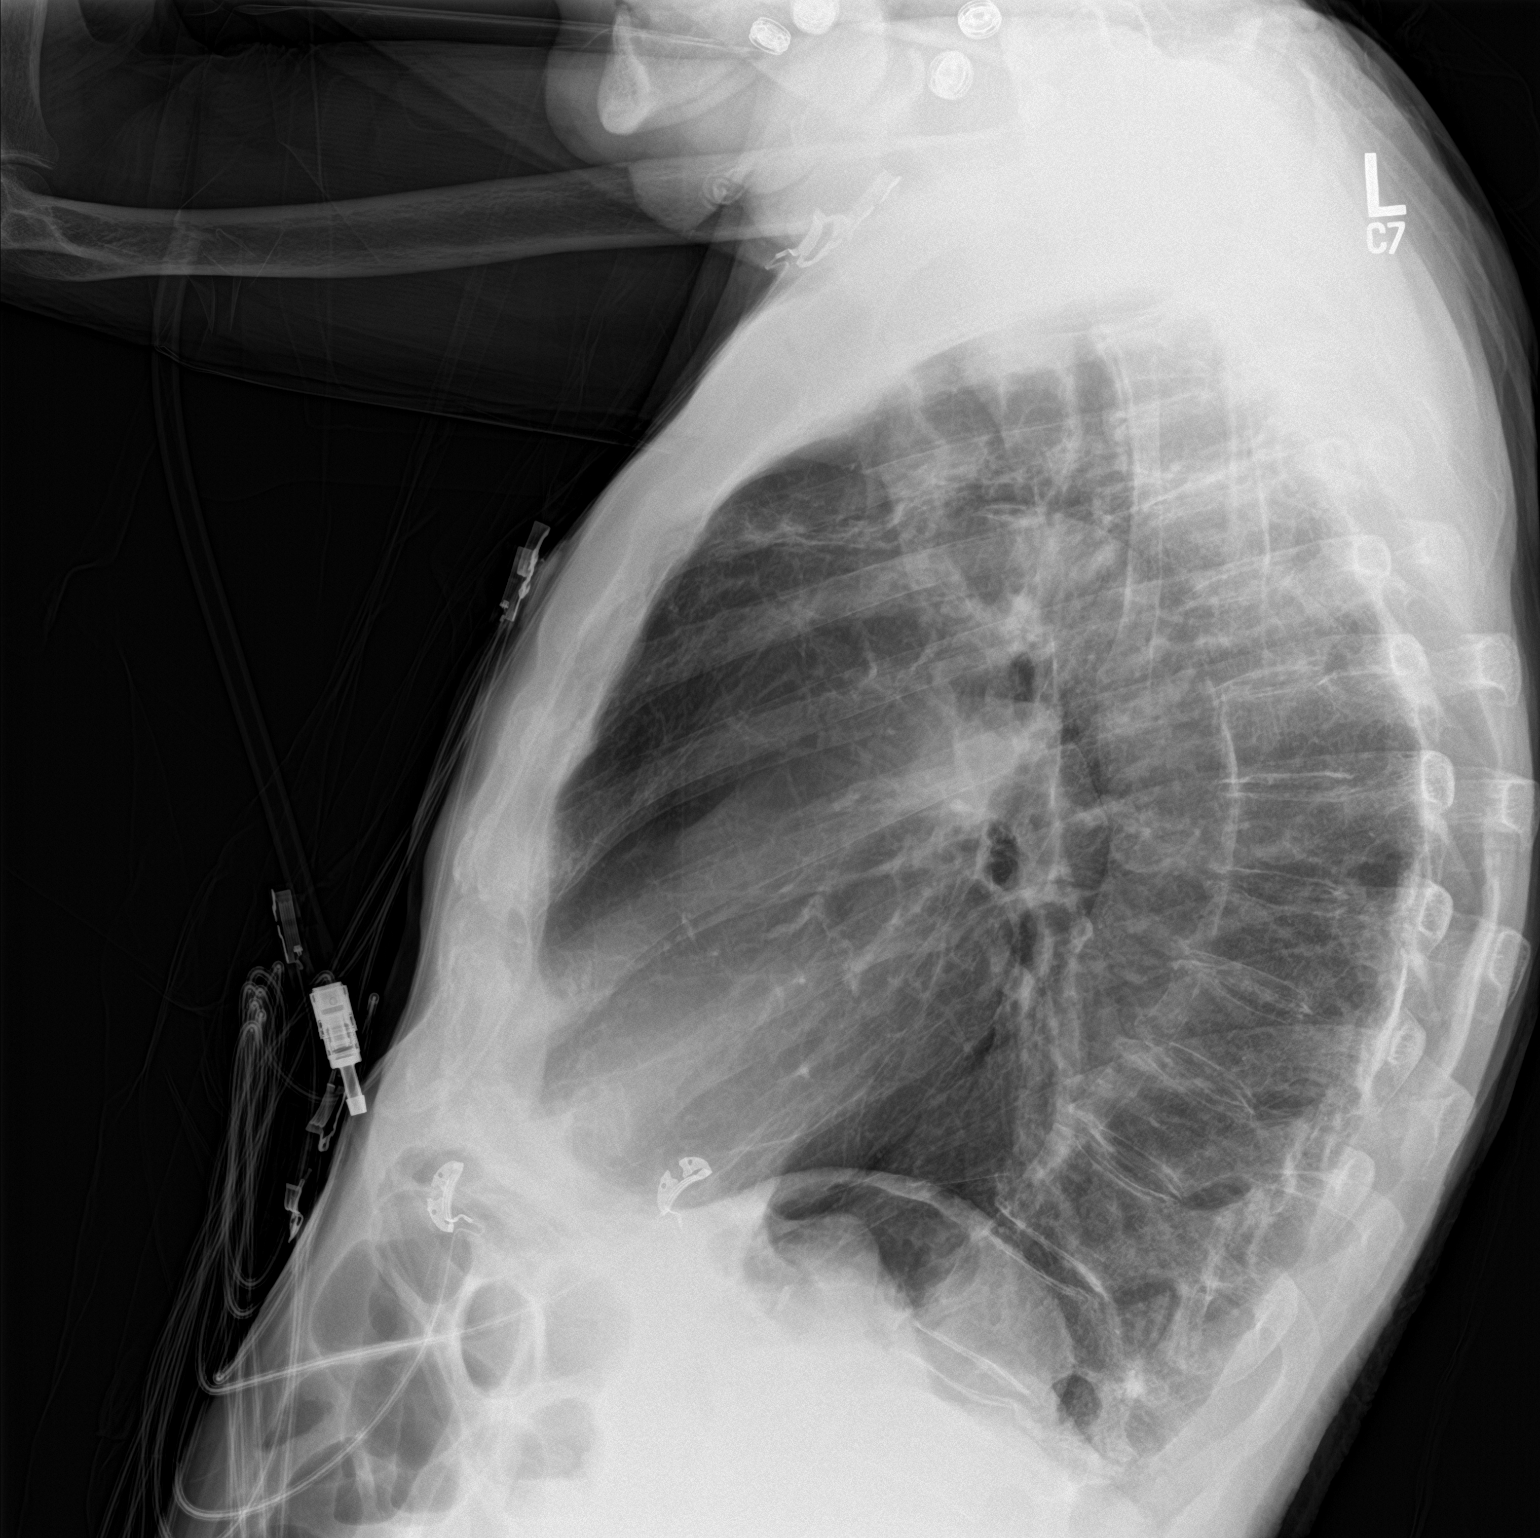

[2 of 2 positions shown; findings below may reference images not displayed]

FINDINGS: The lungs are emphysematous with unchanged right upper lobe scar. No
consolidative process, pneumothorax or effusion. Heart size is
normal. Aortic atherosclerosis noted. No acute or focal bony
abnormality.
IMPRESSION: No acute disease.

Emphysema and unchanged right upper lobe scar.

Atherosclerosis.

## 2018-11-12 IMAGING — CT CT ABD-PELV W/ CM
2 of 7 series · 15 of 46 positions shown, 17 images · IV contrast (Isovue)
Comparison: Abdominal radiograph [DATE]

CLINICAL DATA: Mild abdominal pain and nausea for 3 days. Known
hernia.

EXAM:
CT ABDOMEN AND PELVIS WITH CONTRAST
TECHNIQUE: Multidetector CT imaging of the abdomen and pelvis was performed
using the standard protocol following bolus administration of
intravenous contrast.
CONTRAST:  100mL OMNIPAQUE IOHEXOL 300 MG/ML  SOLN

[Series 2: axial st · axial · 0.67mm/px · z∈[-555,-190]mm · 12 of 85 slices shown, 14 images]
[im 6/85  soft-tissue]
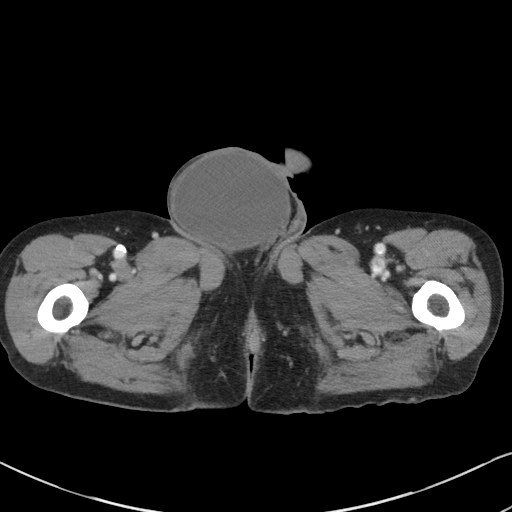
[im 6/85  bone]
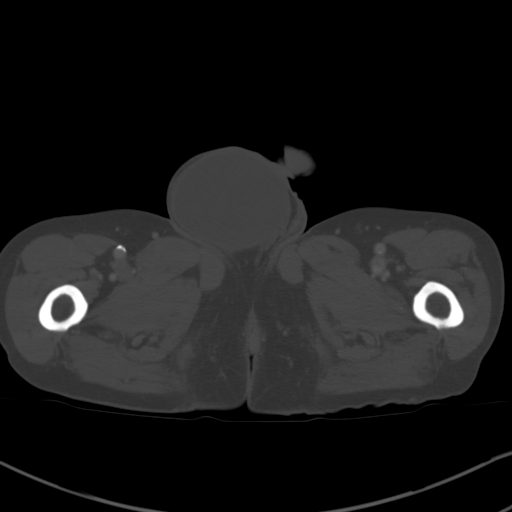
[im 12/85  soft-tissue]
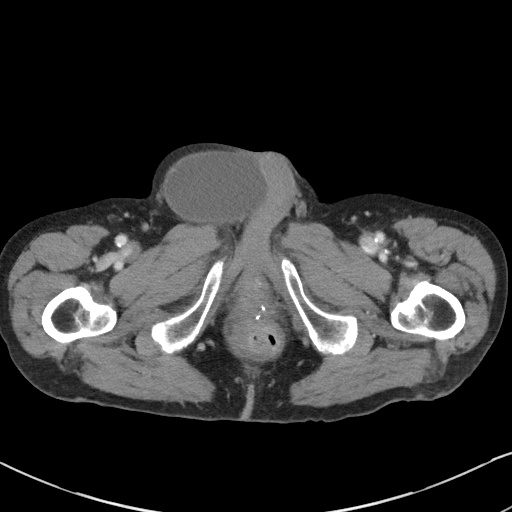
[im 17/85  soft-tissue]
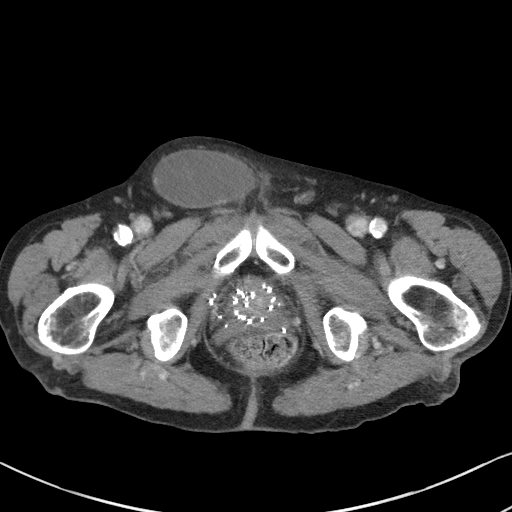
[im 29/85  soft-tissue]
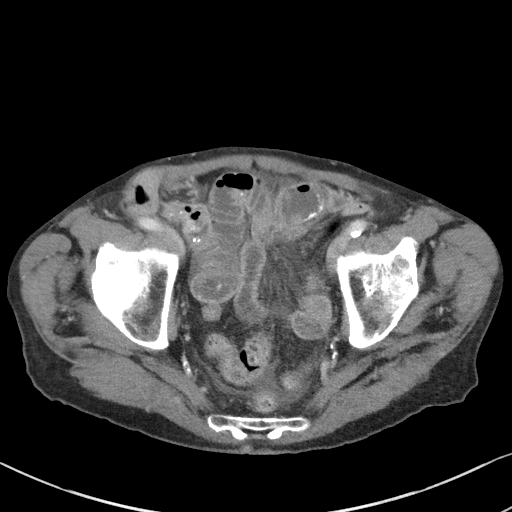
[im 34/85  soft-tissue]
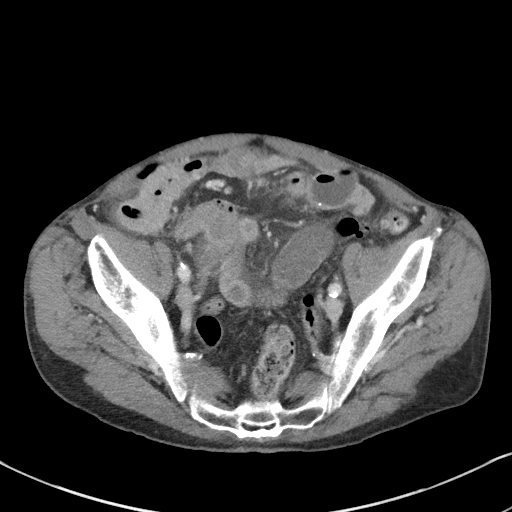
[im 40/85  soft-tissue]
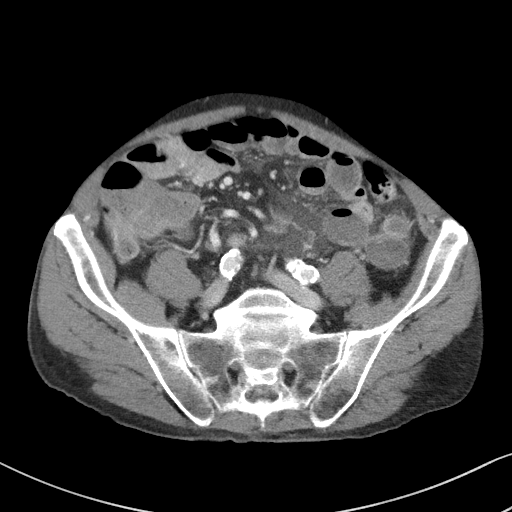
[im 45/85  soft-tissue]
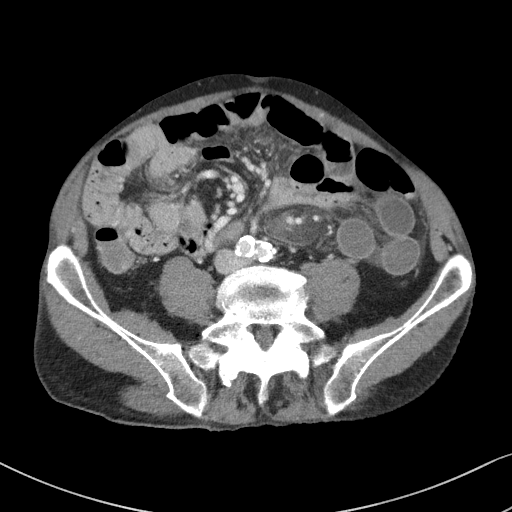
[im 51/85  soft-tissue]
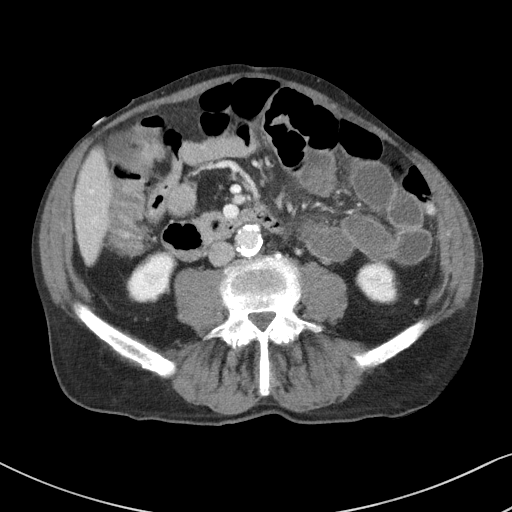
[im 57/85  soft-tissue]
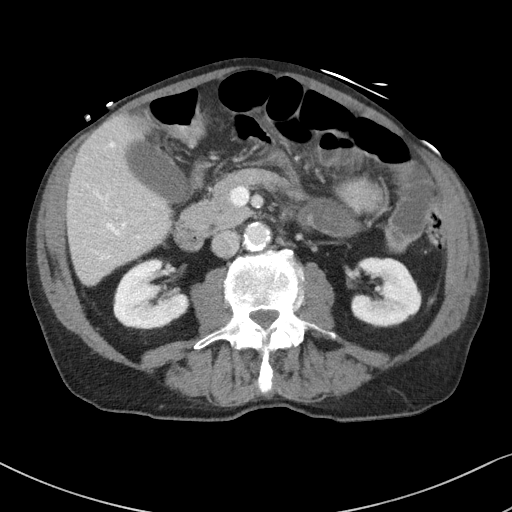
[im 57/85  bone]
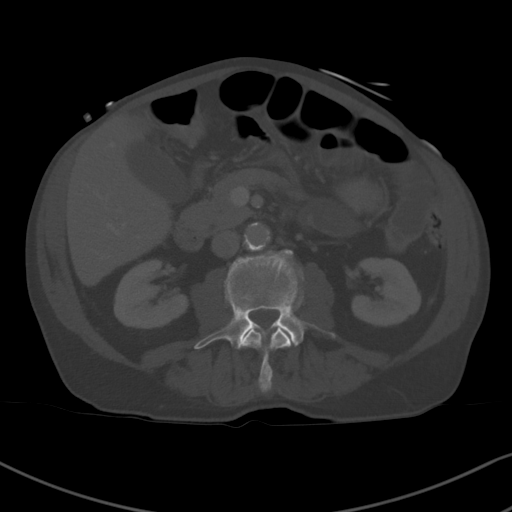
[im 68/85  soft-tissue]
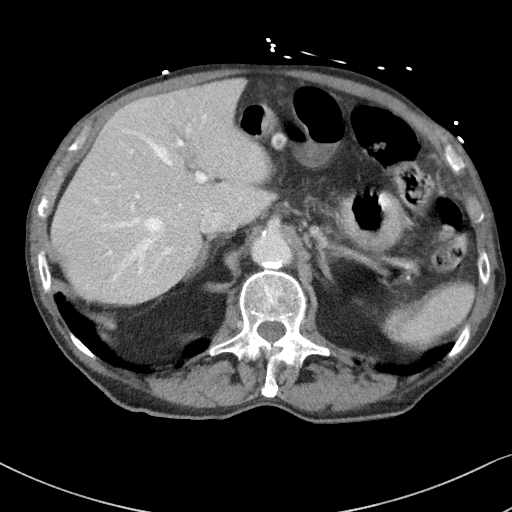
[im 73/85  soft-tissue]
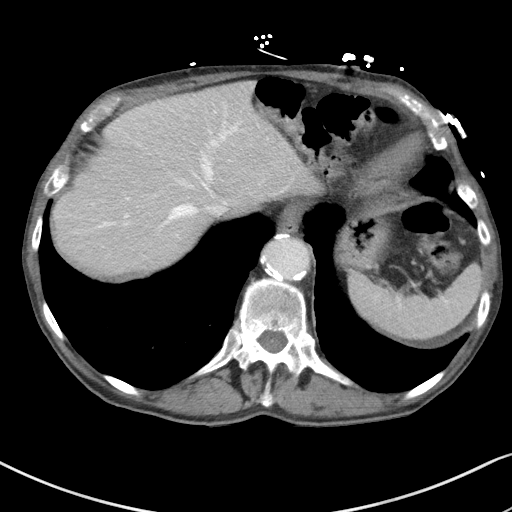
[im 79/85  soft-tissue]
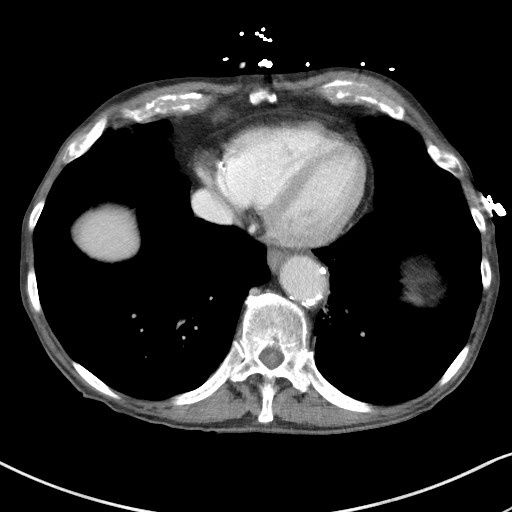

[Series 8: coronal st · coronal · 0.59mm/px · 3 of 83 slices shown]
[im 28/83  soft-tissue]
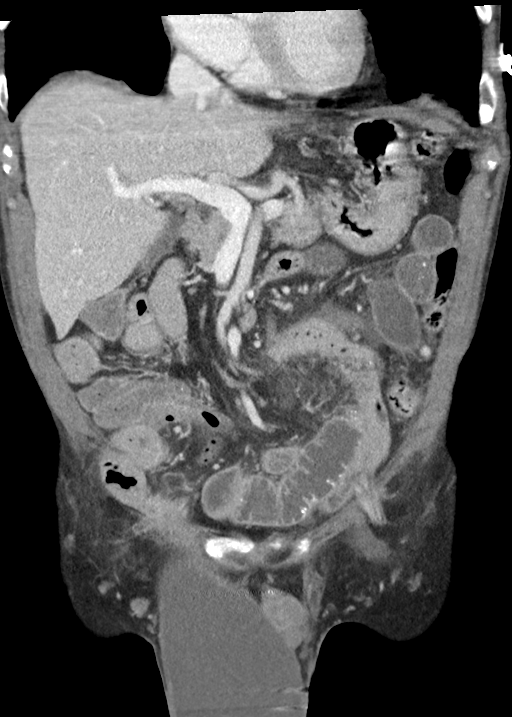
[im 37/83  soft-tissue]
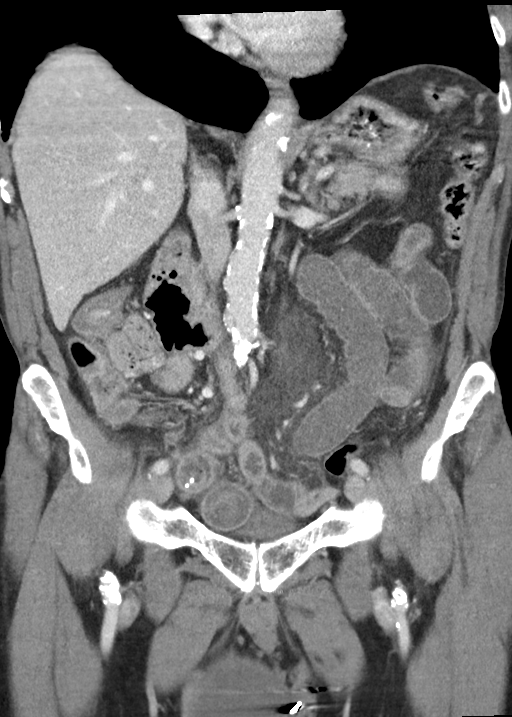
[im 46/83  soft-tissue]
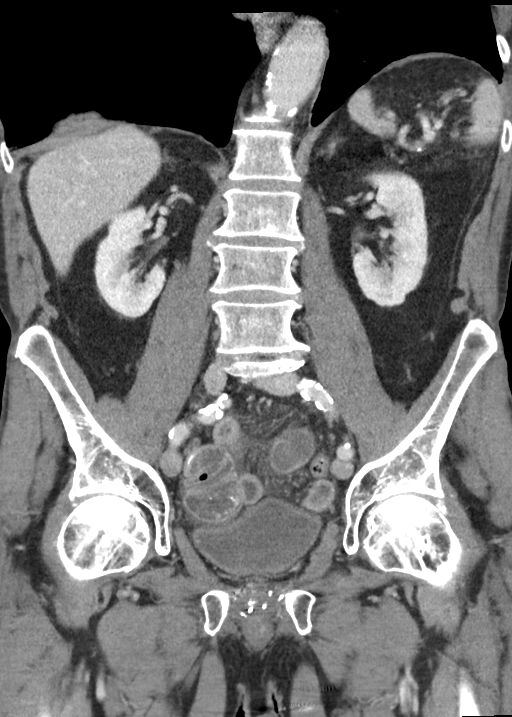

[15 of 46 positions shown; findings below may reference images not displayed]

FINDINGS: Lower chest: Emphysematous changes and subpleural scarring in the
lung bases. Calcific atherosclerotic disease of the coronary
arteries and aorta.

Hepatobiliary: No focal liver abnormality is seen. Cholelithiasis
without gallbladder wall thickening, or biliary dilatation.

Pancreas: Unremarkable. No pancreatic ductal dilatation or
surrounding inflammatory changes.

Spleen: Normal in size without focal abnormality.

Adrenals/Urinary Tract: Adrenal glands are unremarkable. Kidneys are
normal, without renal calculi, focal lesion, or hydronephrosis.
Bladder is unremarkable.

Stomach/Bowel: Mild circumferential thickening of the gastric wall.
There is a right inguinal hernia containing small bowel, which
likely serves as a transitional point small bowel obstruction as
there is upstream small bowel wall thickening and fluid distention
of up to 3.4 cm. There are associated inflammatory changes within
the central mesentery. There is circumferential bowel wall
thickening of the small bowel within the hernial sac, concerning for
strangulation. There is a large associated hydrocele within the
right testicular sac

Vascular/Lymphatic: Aortic atherosclerosis. No enlarged abdominal or
pelvic lymph nodes.

Reproductive: Radioactive seeds within the prostate gland.

Other: Small volume abdominopelvic ascites.

Musculoskeletal: No acute or significant osseous findings.
IMPRESSION: 1. Narrow neck right inguinal hernia containing small bowel, which
likely serves as a transitional point for an early/incomplete small
bowel obstruction as there is upstream small bowel wall thickening
and fluid distention of up to 3.4 cm. There is associated
inflammatory changes within the central mesentery. There is
circumferential bowel wall thickening of the small bowel within the
hernial sac, concerning for strangulation. Large associated right
hydrocele.
2. Small volume abdominopelvic ascites.
3. Mild circumferential thickening of the gastric wall, nonspecific,
possibly secondary to gastritis.
4. Cholelithiasis without evidence of acute cholecystitis.
5. Calcific atherosclerotic disease of the coronary arteries and
aorta.
6. Emphysematous changes and subpleural scarring in the lung bases.

Emphysema ([1H]-[1H]).

These results were called by telephone at the time of interpretation
on [DATE] at [DATE] to Dr. KURIAN , who verbally
acknowledged these results.

## 2018-11-12 MED ORDER — SODIUM CHLORIDE 0.9% FLUSH
3.0000 mL | Freq: Once | INTRAVENOUS | Status: AC
  Administered 2018-11-12: 3 mL via INTRAVENOUS

## 2018-11-12 MED ORDER — FAMOTIDINE IN NACL 20-0.9 MG/50ML-% IV SOLN
20.0000 mg | Freq: Once | INTRAVENOUS | Status: AC
  Administered 2018-11-12: 20 mg via INTRAVENOUS
  Filled 2018-11-12: qty 50

## 2018-11-12 MED ORDER — IPRATROPIUM-ALBUTEROL 0.5-2.5 (3) MG/3ML IN SOLN
3.0000 mL | Freq: Once | RESPIRATORY_TRACT | Status: AC
  Administered 2018-11-12: 3 mL via RESPIRATORY_TRACT
  Filled 2018-11-12: qty 3

## 2018-11-12 MED ORDER — IPRATROPIUM-ALBUTEROL 0.5-2.5 (3) MG/3ML IN SOLN
RESPIRATORY_TRACT | Status: AC
  Filled 2018-11-12: qty 3

## 2018-11-12 MED ORDER — FENTANYL CITRATE (PF) 100 MCG/2ML IJ SOLN
6.5000 ug | INTRAMUSCULAR | Status: DC | PRN
  Filled 2018-11-12: qty 2

## 2018-11-12 MED ORDER — IOHEXOL 300 MG/ML  SOLN
100.0000 mL | Freq: Once | INTRAMUSCULAR | Status: AC | PRN
  Administered 2018-11-12: 100 mL via INTRAVENOUS

## 2018-11-12 NOTE — Discharge Instructions (Addendum)
Inguinal Hernia, Adult An inguinal hernia develops when fat or the intestines push through a weak spot in a muscle where your leg meets your lower abdomen (groin). This creates a bulge. This kind of hernia could also be: In your scrotum, if you are male. In folds of skin around your vagina, if you are male. There are three types of inguinal hernias: Hernias that can be pushed back into the abdomen (are reducible). This type rarely causes pain. Hernias that are not reducible (are incarcerated). Hernias that are not reducible and lose their blood supply (are strangulated). This type of hernia requires emergency surgery. What are the causes? This condition is caused by having a weak spot in the muscles or tissues in the groin. This weak spot develops over time. The hernia may poke through the weak spot when you suddenly strain your lower abdominal muscles, such as when you: Lift a heavy object. Strain to have a bowel movement. Constipation can lead to straining. Cough. What increases the risk? This condition is more likely to develop in: Men. Pregnant women. People who: Are overweight. Work in jobs that require long periods of standing or heavy lifting. Have had an inguinal hernia before. Smoke or have lung disease. These factors can lead to long-lasting (chronic) coughing. What are the signs or symptoms? Symptoms may depend on the size of the hernia. Often, a small inguinal hernia has no symptoms. Symptoms of a larger hernia may include: A lump in the groin area. This is easier to see when standing. It might not be visible when lying down. Pain or burning in the groin. This may get worse when lifting, straining, or coughing. A dull ache or a feeling of pressure in the groin. In men, an unusual lump in the scrotum. Symptoms of a strangulated inguinal hernia may include: A bulge in your groin that is very painful and tender to the touch. A bulge that turns red or purple. Fever, nausea,  and vomiting. Inability to have a bowel movement or to pass gas. How is this diagnosed? This condition is diagnosed based on your symptoms, your medical history, and a physical exam. Your health care provider may feel your groin area and ask you to cough. How is this treated? Treatment depends on the size of your hernia and whether you have symptoms. If you do not have symptoms, your health care provider may have you watch your hernia carefully and have you come in for follow-up visits. If your hernia is large or if you have symptoms, you may need surgery to repair the hernia. Follow these instructions at home: Lifestyle Avoid lifting heavy objects. Avoid standing for long periods of time. Do not use any products that contain nicotine or tobacco, such as cigarettes and e-cigarettes. If you need help quitting, ask your health care provider. Maintain a healthy weight. Preventing constipation Take actions to prevent constipation. Constipation leads to straining with bowel movements, which can make a hernia worse or cause a hernia repair to break down. Your health care provider may recommend that you: Drink enough fluid to keep your urine pale yellow. Eat foods that are high in fiber, such as fresh fruits and vegetables, whole grains, and beans. Limit foods that are high in fat and processed sugars, such as fried or sweet foods. Take an over-the-counter or prescription medicine for constipation. General instructions You may try to push the hernia back in place by very gently pressing on it while lying down. Do not try to force the  bulge back in if it will not push in easily. Watch your hernia for any changes in shape, size, or color. Get help right away if you notice any changes. Take over-the-counter and prescription medicines only as told by your health care provider. Keep all follow-up visits as told by your health care provider. This is important. Contact a health care provider if: You have a  fever. You develop new symptoms. Your symptoms get worse. Get help right away if: You have pain in your groin that suddenly gets worse. You have a bulge in your groin that: Suddenly gets bigger and does not get smaller. Becomes red or purple or painful to the touch. You are a man and you have a sudden pain in your scrotum, or the size of your scrotum suddenly changes. You cannot push the hernia back in place by very gently pressing on it when you are lying down. Do not try to force the bulge back in if it will not push in easily. You have nausea or vomiting that does not go away. You have a fast heartbeat. You cannot have a bowel movement or pass gas. These symptoms may represent a serious problem that is an emergency. Do not wait to see if the symptoms will go away. Get medical help right away. Call your local emergency services (911 in the [place] Summary An inguinal hernia develops when fat or the intestines push through a weak spot in a muscle where your leg meets your lower abdomen (groin). This condition is caused by having a weak spot in muscles or tissue in your groin. Symptoms may depend on the size of the hernia, and they may include pain or swelling in your groin. A small inguinal hernia often has no symptoms. Treatment may not be needed if you do not have symptoms. If you have symptoms or a large hernia, you may need surgery to repair the hernia. Avoid lifting heavy objects. Also avoid standing for long amounts of time. This information is not intended to replace advice given to you by your health care provider. Make sure you discuss any questions you have with your health care provider. Document Released: 01/20/2009 Document Revised: 10/05/2017 Document Reviewed: 06/05/2017 Elsevier Interactive Patient Education  2019 [place] with your general surgeon

## 2018-11-12 NOTE — Telephone Encounter (Signed)
Noted agree with above

## 2018-11-12 NOTE — ED Provider Notes (Signed)
[facility] EMERGENCY DEPARTMENT Provider Note   CSN: [id number] Arrival date & time: 11/12/18  1040    History   Chief Complaint Chief Complaint  Patient presents with  . Abdominal Pain    HPI John Black is a 85 y.o. male.        Pt was seen at 1310.  Per pt, c/o gradual onset and persistence of constant upper abd "pain" for the past 2 days. Has been associated with nausea and one loose stool. Describes the abd pain as "aching." Pt was evaluated by his PMD yesterday for this complaint, received IVF with mild improvement.  Denies vomiting, no fevers, no back pain, no rash, no CP/SOB, no black or blood in stools, no dysuria/hematuria, no testicular pain/swelling.      Past Medical History:  Diagnosis Date  . Allergic rhinitis   . Cataract    s/p removal  . Chronic respiratory failure (HCC)    oxygen 3L at home  . Emphysema   . Hypothyroid   . PNA (pneumonia)   . Prostate cancer [facility])     Patient Active Problem List   Diagnosis Date Noted  . Chronic diarrhea 12/06/2017  . Inguinal hernia 03/02/2014  . Constipation 03/02/2014  . Chronic respiratory failure ([place]) 12/09/2013  . Hemorrhoids 09/28/2013  . Mild hyperlipidemia 06/28/2013  . Sinusitis, chronic 11/28/2012  . BPH (benign prostatic hyperplasia) 10/22/2012  . Insomnia 10/22/2012  . Hypothyroidism 06/19/2012  . Atypical nevi 06/19/2012  . Seborrheic keratosis 06/19/2012  . History of prostate cancer 11/07/2010  . Allergic rhinitis 11/07/2010  . COPD (chronic obstructive pulmonary disease) with emphysema ([place]) 11/07/2010    Past Surgical History:  Procedure Laterality Date  . ADENOIDECTOMY    . ROTATOR CUFF REPAIR  1990,2009   bilateral  . seed implant for prostate cancer  2000  . TONSILLECTOMY    . TRANSURETHRAL RESECTION OF PROSTATE  2001   x2        Home Medications    Prior to Admission medications   Medication Sig Start Date End Date Taking? Authorizing Provider  albuterol  (PROVENTIL) (2.5 MG/3ML) 0.083% nebulizer solution Take 3 mLs (2.5 mg total) by nebulization every 6 (six) hours as needed for wheezing or shortness of breath. 11/28/15  Yes Cherry, Modena Nunnery, MD  dexlansoprazole (DEXILANT) 60 MG capsule Take 1 capsule (60 mg total) by mouth daily. 11/11/18  Yes [place], Modena Nunnery, MD  ibuprofen (ADVIL,MOTRIN) 400 MG tablet Take 400 mg by mouth every 6 (six) hours as needed for fever, headache or mild pain.    Yes [provider]  Ipratropium-Albuterol (COMBIVENT) 20-100 MCG/ACT AERS respimat Inhale 1 puff into the lungs every 6 (six) hours. 01/17/18  Yes , Modena Nunnery, MD  levothyroxine (SYNTHROID, LEVOTHROID) 75 MCG tablet TAKE 1 TABLET (75 MCG TOTAL) BY MOUTH DAILY. 04/14/18  Yes , Modena Nunnery, MD  LORazepam (ATIVAN) 0.5 MG tablet TAKE 1 TABLET BY MOUTH TWICE A DAY AS NEEDED 07/29/18  Yes Susy Frizzle, MD  SYMBICORT 160-4.5 MCG/ACT inhaler TAKE 2 PUFFS BY MOUTH TWICE A DAY 11/10/18  Yes Collene Gobble, MD  tamsulosin (FLOMAX) 0.4 MG CAPS capsule Take 0.4 capsules by mouth daily. 12/09/13  Yes [provider]  traMADol (ULTRAM) 50 MG tablet TAKE 1 TABLET BY MOUTH EVERY 8 HOURS AS NEEDED FOR PAIN 06/25/17  Yes Susy Frizzle, MD  zolpidem (AMBIEN) 10 MG tablet TAKE 1/2 TO 1 TABLET AT BEDTIME AS NEEDED FOR INSOMNIA 08/29/18  Yes  [place], Modena Nunnery, MD  albuterol (PROVENTIL HFA;VENTOLIN HFA) 108 (90 Base) MCG/ACT inhaler Inhale 2 puffs into the lungs every 6 (six) hours as needed for wheezing or shortness of breath. Patient not taking: Reported on 11/12/2018 03/15/16   Collene Gobble, MD  Calcium-Magnesium-Vitamin D (CITRACAL CALCIUM+D PO) Take 1 tablet by mouth every morning.     [provider]  predniSONE (DELTASONE) 10 MG tablet Take 1.5 tablets (15 mg total) by mouth daily with breakfast. Patient not taking: Reported on 11/12/2018 11/03/18   Collene Gobble, MD    Family History Family History  Problem Relation Age of Onset  .  Heart disease Mother   . Prostate cancer Father     Social History Social History   Tobacco Use  . Smoking status: Former Smoker    Packs/day: 1.50    Years: 50.00    Pack years: 75.00    Types: Cigarettes    Last attempt to quit: 09/17/2008    Years since quitting: 10.1  . Smokeless tobacco: Never Used  Substance Use Topics  . Alcohol use: Yes    Alcohol/week: 1.0 standard drinks    Types: 1 Glasses of wine per week    Comment: each evening  . Drug use: No     Allergies   Morphine   Review of Systems Review of Systems ROS: Statement: All systems negative except as marked or noted in the HPI; Constitutional: Negative for fever and chills. ; ; Eyes: Negative for eye pain, redness and discharge. ; ; ENMT: Negative for ear pain, hoarseness, nasal congestion, sinus pressure and sore throat. ; ; Cardiovascular: Negative for chest pain, palpitations, diaphoresis, dyspnea and peripheral edema. ; ; Respiratory: Negative for cough, wheezing and stridor. ; ; Gastrointestinal: +nausea, loose stool, abd pain. Negative for vomiting, diarrhea, blood in stool, hematemesis, jaundice and rectal bleeding. . ; ; Genitourinary: Negative for dysuria, flank pain and hematuria. ; ; Musculoskeletal: Negative for back pain and neck pain. Negative for swelling and trauma.; ; Skin: Negative for pruritus, rash, abrasions, blisters, bruising and skin lesion.; ; Neuro: Negative for headache, lightheadedness and neck stiffness. Negative for weakness, altered level of consciousness, altered mental status, extremity weakness, paresthesias, involuntary movement, seizure and syncope.       Physical Exam Updated Vital Signs BP 123/70   Pulse 95   Temp 98 F (36.7 C)   Resp 19   Ht 5\' 4"  (1.626 m)   Wt 55.7 kg   SpO2 99%   BMI 21.08 kg/m    Patient Vitals for the past 24 hrs:  BP Temp Pulse Resp SpO2 Height Weight  11/12/18 1420 - - - - 99 % - -  11/12/18 1335 123/70 - - 19 - - -  11/12/18 1052 (!)  158/80 98 F (36.7 C) 95 20 91 % - -  11/12/18 1051 - - - - - 5\' 4"  (1.626 m) 55.7 kg     Physical Exam 1315: Physical examination:  Nursing notes reviewed; Vital signs and O2 SAT reviewed;  Constitutional: Well developed, Well nourished, Well hydrated, In no acute distress; Head:  Normocephalic, atraumatic; Eyes: EOMI, PERRL, No scleral icterus; ENMT: Mouth and pharynx normal, Mucous membranes moist; Neck: Supple, Full range of motion, No lymphadenopathy; Cardiovascular: Regular rate and rhythm, No gallop; Respiratory: Breath sounds clear & equal bilaterally, No wheezes.  Speaking full sentences with ease, Normal respiratory effort/excursion; Chest: Nontender, Movement normal; Abdomen: Soft, +mild epigastric tenderness to palp. No rebound or guarding. +mildly  distended.  Normal bowel sounds; Genitourinary: No CVA tenderness; Extremities: Peripheral pulses normal, No tenderness, No edema, No calf edema or asymmetry.; Neuro: AA&Ox3, Major CN grossly intact.  Speech clear. No gross focal motor or sensory deficits in extremities.; Skin: Color normal, Warm, Dry.   ED Treatments / Results  Labs (all labs ordered are listed, but only abnormal results are displayed)   EKG EKG Interpretation  Date/Time:  Wednesday November 12 2018 13:36:28 EST Ventricular Rate:  98 PR Interval:    QRS Duration: 73 QT Interval:  347 QTC Calculation: 443 R Axis:   74 Text Interpretation:  Sinus rhythm When compared with ECG of 08/09/2013 Rate slower Confirmed by Francine Graven [id number]) on 11/12/2018 2:15:11 PM   Radiology   Procedures Procedures (including critical care time)  Medications Ordered in ED Medications  fentaNYL (SUBLIMAZE) injection 6.5 mcg (has no administration in time range)  sodium chloride flush (NS) 0.9 % injection 3 mL (3 mLs Intravenous Given 11/12/18 1337)  famotidine (PEPCID) IVPB 20 mg premix (0 mg Intravenous Stopped 11/12/18 1415)  ipratropium-albuterol (DUONEB) 0.5-2.5 (3)  MG/3ML nebulizer solution 3 mL (3 mLs Nebulization Given 11/12/18 1420)  iohexol (OMNIPAQUE) 300 MG/ML solution 100 mL (100 mLs Intravenous Contrast Given 11/12/18 1444)     Initial Impression / Assessment and Plan / ED Course  I have reviewed the triage vital signs and the nursing notes.  Pertinent labs & imaging results that were available during my care of the patient were reviewed by me and considered in my medical decision making (see chart for details).     MDM Reviewed: previous chart, nursing note and vitals Reviewed previous: labs and ECG Interpretation: labs, ECG, x-ray and CT scan    Results for orders placed or performed during the hospital encounter of 11/12/18  Lipase, blood  Result Value Ref Range   Lipase 27 11 - 51 U/L  Comprehensive metabolic panel  Result Value Ref Range   Sodium 137 135 - 145 mmol/L   Potassium 4.7 3.5 - 5.1 mmol/L   Chloride 96 (L) 98 - 111 mmol/L   CO2 31 22 - 32 mmol/L   Glucose, Bld 132 (H) 70 - 99 mg/dL   BUN 23 8 - 23 mg/dL   Creatinine, Ser 0.79 0.61 - 1.24 mg/dL   Calcium 9.7 8.9 - 10.3 mg/dL   Total Protein 6.7 6.5 - 8.1 g/dL   Albumin 4.1 3.5 - 5.0 g/dL   AST 23 15 - 41 U/L   ALT 17 0 - 44 U/L   Alkaline Phosphatase 51 38 - 126 U/L   Total Bilirubin 1.0 0.3 - 1.2 mg/dL   GFR calc non Af Amer >60 >60 mL/min   GFR calc Af Amer >60 >60 mL/min   Anion gap 10 5 - 15  CBC  Result Value Ref Range   WBC 12.3 (H) 4.0 - 10.5 K/uL   RBC 5.03 4.22 - 5.81 MIL/uL   Hemoglobin 14.4 13.0 - 17.0 g/dL   HCT 46.5 39.0 - 52.0 %   MCV 92.4 80.0 - 100.0 fL   MCH 28.6 26.0 - 34.0 pg   MCHC 31.0 30.0 - 36.0 g/dL   RDW 13.9 11.5 - 15.5 %   Platelets 336 150 - 400 K/uL   nRBC 0.0 0.0 - 0.2 %  Troponin I - Once  Result Value Ref Range   Troponin I <0.03 <0.03 ng/mL   Dg Chest 2 View Result Date: 11/12/2018 CLINICAL DATA:  Worsening abdominal pain since  11/10/2018. EXAM: CHEST - 2 VIEW COMPARISON:  PA and lateral chest 11/25/2015.  CT chest  12/02/2015. FINDINGS: The lungs are emphysematous with unchanged right upper lobe scar. No consolidative process, pneumothorax or effusion. Heart size is normal. Aortic atherosclerosis noted. No acute or focal bony abnormality. IMPRESSION: No acute disease. Emphysema and unchanged right upper lobe scar. Atherosclerosis. Electronically Signed   By: Inge Rise M.D.   On: 11/12/2018 15:03   Ct Abdomen Pelvis W Contrast Result Date: 11/12/2018 CLINICAL DATA:  Mild abdominal pain and nausea for 3 days. Known hernia. EXAM: CT ABDOMEN AND PELVIS WITH CONTRAST TECHNIQUE: Multidetector CT imaging of the abdomen and pelvis was performed using the standard protocol following bolus administration of intravenous contrast. CONTRAST:  15mL OMNIPAQUE IOHEXOL 300 MG/ML  SOLN COMPARISON:  Abdominal radiograph 12/06/2017 FINDINGS: Lower chest: Emphysematous changes and subpleural scarring in the lung bases. Calcific atherosclerotic disease of the coronary arteries and aorta. Hepatobiliary: No focal liver abnormality is seen. Cholelithiasis without gallbladder wall thickening, or biliary dilatation. Pancreas: Unremarkable. No pancreatic ductal dilatation or surrounding inflammatory changes. Spleen: Normal in size without focal abnormality. Adrenals/Urinary Tract: Adrenal glands are unremarkable. Kidneys are normal, without renal calculi, focal lesion, or hydronephrosis. Bladder is unremarkable. Stomach/Bowel: Mild circumferential thickening of the gastric wall. There is a right inguinal hernia containing small bowel, which likely serves as a transitional point small bowel obstruction as there is upstream small bowel wall thickening and fluid distention of up to 3.4 cm. There are associated inflammatory changes within the central mesentery. There is circumferential bowel wall thickening of the small bowel within the hernial sac, concerning for strangulation. There is a large associated hydrocele within the right testicular sac  Vascular/Lymphatic: Aortic atherosclerosis. No enlarged abdominal or pelvic lymph nodes. Reproductive: Radioactive seeds within the prostate gland. Other: Small volume abdominopelvic ascites. Musculoskeletal: No acute or significant osseous findings. IMPRESSION: 1. Narrow neck right inguinal hernia containing small bowel, which likely serves as a transitional point for an early/incomplete small bowel obstruction as there is upstream small bowel wall thickening and fluid distention of up to 3.4 cm. There is associated inflammatory changes within the central mesentery. There is circumferential bowel wall thickening of the small bowel within the hernial sac, concerning for strangulation. Large associated right hydrocele. 2. Small volume abdominopelvic ascites. 3. Mild circumferential thickening of the gastric wall, nonspecific, possibly secondary to gastritis. 4. Cholelithiasis without evidence of acute cholecystitis. 5. Calcific atherosclerotic disease of the coronary arteries and aorta. 6. Emphysematous changes and subpleural scarring in the lung bases. Emphysema (ICD10-J43.9). These results were called by telephone at the time of interpretation on 11/12/2018 at 4:02 pm to Dr. Francine Graven , who verbally acknowledged these results. Electronically Signed   By: Fidela Salisbury M.D.   On: 11/12/2018 16:09    1625:  CT as above. Pt examined: states hernia has been present x3 years and it "slides in and out." States he didn't think to mention it before during my eval, as most of his discomfort is located mid-epigastric area (likely gastritis on CT scan).  +large right inguinal hernia into right scrotum, TTP, no overlying skin changes, no scrotal/testicular pain. Unable to completely reduce. Will need General Surgery consult. Sign out to Dr. Lacinda Axon for further consult.       Final Clinical Impressions(s) / ED Diagnoses   Final diagnoses:  None    ED Discharge Orders    None       Francine Graven,  DO 11/14/18 1610

## 2018-11-12 NOTE — ED Triage Notes (Signed)
Pt sent by Dr Buelah Manis for abdominal pain which started on Monday. Pt was seen in the office and diagnosed with epigastric abdominal pain and sent home. Pt states he has had increased in pain and nausea through the night.

## 2018-11-12 NOTE — Consult Note (Signed)
Reason for Consult: Incarcerated right inguinal hernia Referring Physician: Dr. Lynden Ang is an 85 y.o. male.  HPI: Patient is an 85 year old white male who presented emergency room with worsening epigastric pain and nausea.  He had seen his primary care doctor yesterday and was diagnosed with enteritis.  Patient underwent a CT scan of the abdomen which revealed a possible strangulated right inguinal hernia.  Patient states he has had a right inguinal hernia for many years.  It always sticks out.  He has been followed by Dr. Rosendo Gros of [facility] and has not had it repaired due to his significant COPD.  He states that is more prominent than usual, but has no pain in the right groin region.  He recently was placed back on oral prednisone for acute exacerbation of his COPD.  He is chronically on home O2.  Past Medical History:  Diagnosis Date  . Allergic rhinitis   . Cataract    s/p removal  . Chronic respiratory failure (HCC)    oxygen 3L at home  . Emphysema   . Hypothyroid   . PNA (pneumonia)   . Prostate cancer [facility])     Past Surgical History:  Procedure Laterality Date  . ADENOIDECTOMY    . ROTATOR CUFF REPAIR  1990,2009   bilateral  . seed implant for prostate cancer  2000  . TONSILLECTOMY    . TRANSURETHRAL RESECTION OF PROSTATE  2001   x2    Family History  Problem Relation Age of Onset  . Heart disease Mother   . Prostate cancer Father     Social History:  reports that he quit smoking about 10 years ago. His smoking use included cigarettes. He has a 75.00 pack-year smoking history. He has never used smokeless tobacco. He reports current alcohol use of about 1.0 standard drinks of alcohol per week. He reports that he does not use drugs.  Allergies:  Allergies  Allergen Reactions  . Morphine     REACTION: sweats    Medications: I have reviewed the patient's current medications.  Results for orders placed or performed during the  hospital encounter of 11/12/18 (from the past 48 hour(s))  Lipase, blood     Status: None   Collection Time: 11/12/18 11:38 AM  Result Value Ref Range   Lipase 27 11 - 51 U/L    Comment: Performed at [facility], [address], [place], [place] [address]  Comprehensive metabolic panel     Status: Abnormal   Collection Time: 11/12/18 11:38 AM  Result Value Ref Range   Sodium 137 135 - 145 mmol/L   Potassium 4.7 3.5 - 5.1 mmol/L   Chloride 96 (L) 98 - 111 mmol/L   CO2 31 22 - 32 mmol/L   Glucose, Bld 132 (H) 70 - 99 mg/dL   BUN 23 8 - 23 mg/dL   Creatinine, Ser 0.79 0.61 - 1.24 mg/dL   Calcium 9.7 8.9 - 10.3 mg/dL   Total Protein 6.7 6.5 - 8.1 g/dL   Albumin 4.1 3.5 - 5.0 g/dL   AST 23 15 - 41 U/L   ALT 17 0 - 44 U/L   Alkaline Phosphatase 51 38 - 126 U/L   Total Bilirubin 1.0 0.3 - 1.2 mg/dL   GFR calc non Af Amer >60 >60 mL/min   GFR calc Af Amer >60 >60 mL/min   Anion gap 10 5 - 15    Comment: Performed at [facility], [address],  [place], [place] [address]  CBC     Status: Abnormal   Collection Time: 11/12/18 11:38 AM  Result Value Ref Range   WBC 12.3 (H) 4.0 - 10.5 K/uL   RBC 5.03 4.22 - 5.81 MIL/uL   Hemoglobin 14.4 13.0 - 17.0 g/dL   HCT 46.5 39.0 - 52.0 %   MCV 92.4 80.0 - 100.0 fL   MCH 28.6 26.0 - 34.0 pg   MCHC 31.0 30.0 - 36.0 g/dL   RDW 13.9 11.5 - 15.5 %   Platelets 336 150 - 400 K/uL   nRBC 0.0 0.0 - 0.2 %    Comment: Performed at [facility], [address], [place], [place] [address]  Troponin I - Once     Status: None   Collection Time: 11/12/18 11:38 AM  Result Value Ref Range   Troponin I <0.03 <0.03 ng/mL    Comment: Performed at [facility], [address] [place], [place] [address]    Dg Chest 2 View  Result Date: 11/12/2018 CLINICAL DATA:  Worsening abdominal pain since 11/10/2018. EXAM: CHEST - 2 VIEW COMPARISON:  PA and lateral chest 11/25/2015.  CT chest 12/02/2015. FINDINGS: The lungs are emphysematous with unchanged right upper lobe  scar. No consolidative process, pneumothorax or effusion. Heart size is normal. Aortic atherosclerosis noted. No acute or focal bony abnormality. IMPRESSION: No acute disease. Emphysema and unchanged right upper lobe scar. Atherosclerosis. Electronically Signed   By: Inge Rise M.D.   On: 11/12/2018 15:03   Ct Abdomen Pelvis W Contrast  Result Date: 11/12/2018 CLINICAL DATA:  Mild abdominal pain and nausea for 3 days. Known hernia. EXAM: CT ABDOMEN AND PELVIS WITH CONTRAST TECHNIQUE: Multidetector CT imaging of the abdomen and pelvis was performed using the standard protocol following bolus administration of intravenous contrast. CONTRAST:  170mL OMNIPAQUE IOHEXOL 300 MG/ML  SOLN COMPARISON:  Abdominal radiograph 12/06/2017 FINDINGS: Lower chest: Emphysematous changes and subpleural scarring in the lung bases. Calcific atherosclerotic disease of the coronary arteries and aorta. Hepatobiliary: No focal liver abnormality is seen. Cholelithiasis without gallbladder wall thickening, or biliary dilatation. Pancreas: Unremarkable. No pancreatic ductal dilatation or surrounding inflammatory changes. Spleen: Normal in size without focal abnormality. Adrenals/Urinary Tract: Adrenal glands are unremarkable. Kidneys are normal, without renal calculi, focal lesion, or hydronephrosis. Bladder is unremarkable. Stomach/Bowel: Mild circumferential thickening of the gastric wall. There is a right inguinal hernia containing small bowel, which likely serves as a transitional point small bowel obstruction as there is upstream small bowel wall thickening and fluid distention of up to 3.4 cm. There are associated inflammatory changes within the central mesentery. There is circumferential bowel wall thickening of the small bowel within the hernial sac, concerning for strangulation. There is a large associated hydrocele within the right testicular sac Vascular/Lymphatic: Aortic atherosclerosis. No enlarged abdominal or pelvic  lymph nodes. Reproductive: Radioactive seeds within the prostate gland. Other: Small volume abdominopelvic ascites. Musculoskeletal: No acute or significant osseous findings. IMPRESSION: 1. Narrow neck right inguinal hernia containing small bowel, which likely serves as a transitional point for an early/incomplete small bowel obstruction as there is upstream small bowel wall thickening and fluid distention of up to 3.4 cm. There is associated inflammatory changes within the central mesentery. There is circumferential bowel wall thickening of the small bowel within the hernial sac, concerning for strangulation. Large associated right hydrocele. 2. Small volume abdominopelvic ascites. 3. Mild circumferential thickening of the gastric wall, nonspecific, possibly secondary to gastritis. 4. Cholelithiasis without evidence of acute cholecystitis. 5. Calcific  atherosclerotic disease of the coronary arteries and aorta. 6. Emphysematous changes and subpleural scarring in the lung bases. Emphysema (ICD10-J43.9). These results were called by telephone at the time of interpretation on 11/12/2018 at 4:02 pm to Dr. Francine Graven , who verbally acknowledged these results. Electronically Signed   By: Fidela Salisbury M.D.   On: 11/12/2018 16:09    ROS:  Pertinent items are noted in HPI.  Blood pressure (!) 119/58, pulse 95, temperature 98 F (36.7 C), resp. rate 19, height 5\' 4"  (1.626 m), weight 55.7 kg, SpO2 99 %. Physical Exam: Pleasant white male no acute distress Head is normocephalic, atraumatic Heart examination reveals a regular rate and rhythm without S3, S4, murmurs Abdomen is soft and flat.  A reducible right inguinal hernia is present without skin changes.  Patient had minimal pain on reduction.  He does have a right hydrocele.  The hernia does come in and out, but is now easily reducible.  CT scan results reviewed.  Assessment/Plan: Impression: Incarcerated right inguinal hernia, reduced. Plan:  Okay for discharge from surgery standpoint.  Patient will follow-up with Dr. Rosendo Gros for reconsideration of repair given this episode of incarceration.  The signs and symptoms of incarceration and how to reduce this at home were explained to the patient.  Aviva Signs 11/12/2018, 5:04 PM

## 2018-11-12 NOTE — ED Provider Notes (Signed)
Hernia reduced by general surgeon Dr. Aviva Signs.  Patient is stable for discharge per general surgeon   Nat Christen, MD 11/12/18 1728

## 2018-11-12 NOTE — Telephone Encounter (Signed)
Received call from patient John Black.   Reports that patient has increased epigastric pain and vomiting. Advised to go to ER for eval. States that she will take him to [facility].   Call placed to [facility] and report given.

## 2018-11-12 NOTE — ED Notes (Signed)
Pt was informed that we need a urine sample. Pt was given a urinal. 

## 2018-11-12 NOTE — ED Notes (Signed)
Patient transported to X-ray 

## 2018-11-13 ENCOUNTER — Telehealth: Payer: Self-pay | Admitting: Emergency Medicine

## 2018-11-13 ENCOUNTER — Telehealth: Payer: Self-pay | Admitting: Family Medicine

## 2018-11-13 NOTE — Telephone Encounter (Signed)
Called and spoke with Patient.  He stated that he was seen Weds. In the ED for abd pain, that was diagnosed as gastric pain.  He stated that his PCP told him not to take any more prednisone until he saw PCP or pulmonology again.  Patient stated that he started Prednisone Friday, so he only had 5 days worth. He has not taken since and he doesn't have OV until 01/02/19, with RB.  Patient wanted Dr Lamonte Sakai to know that he was not taking prednisone and wanted to know if he should stay off until April OV?  Message routed to Dr Lamonte Sakai

## 2018-11-13 NOTE — Telephone Encounter (Signed)
Spoke with pt, Pepcid given IV for gastritis which helped his pain Hernia was reduced but was never having inguinal pain or pain related. Dexilant made his bloat, so wife had pepcid 20mg  at home will give BID Hold prednisone He was trying soft diet as we were talking around 7pm. They will call pulmonary in the morning about his prednisone  I recommend holding off for now and when we restart if needed low dose such as 5mg  daily  Please schedule Hospital f/u for next week- 30 minute slot- Can be next Tues/Wed

## 2018-11-13 NOTE — Telephone Encounter (Signed)
Call placed to patient wife Enid Derry.   Reports that patient is had some Ensure for breakfast and is doing better.   Appointment scheduled for Tuesday.

## 2018-11-13 NOTE — Telephone Encounter (Signed)
Patient returned call, CB is [phone] or [phone]

## 2018-11-13 NOTE — Telephone Encounter (Signed)
ATC pt, no answer. Left message for pt to call back.  

## 2018-11-13 NOTE — Telephone Encounter (Signed)
He states went to ED on 02/26 with stomach pain.  Call was dropped and unable to reach him to finish message.

## 2018-11-14 ENCOUNTER — Telehealth: Payer: Self-pay | Admitting: Family Medicine

## 2018-11-14 NOTE — Telephone Encounter (Signed)
Call placed to patient.   Advised to continue Pepcid.   Also inquired as to if he should continue stool softener. Advised that as long as he has watery loose stools, hold softener.

## 2018-11-14 NOTE — Telephone Encounter (Signed)
I think it would be reasonable for him to stay off of it and see how he does. We can see him sooner if his breathing worsens - discuss the options

## 2018-11-14 NOTE — Telephone Encounter (Signed)
Unresolved stomach pain. Gastritis was diagnosis at hospital. Pt wife Jaci Standard calling to check the medication Dexalint samples given by Dr. Buelah Manis to see if it also helps with gastritis. Pepcid was given at th hospital and that helped per wife Shirlee cell ph [phone].

## 2018-11-14 NOTE — Telephone Encounter (Signed)
Called and spoke with Patient.  Dr Lamonte Sakai recommendations given.  Understanding stated.  Nothing further at this time.

## 2018-11-18 ENCOUNTER — Ambulatory Visit (INDEPENDENT_AMBULATORY_CARE_PROVIDER_SITE_OTHER): Payer: PPO | Admitting: Family Medicine

## 2018-11-18 ENCOUNTER — Other Ambulatory Visit: Payer: Self-pay

## 2018-11-18 ENCOUNTER — Encounter: Payer: Self-pay | Admitting: Family Medicine

## 2018-11-18 VITALS — BP 112/60 | HR 102 | Temp 98.9°F | Resp 20 | Ht 64.0 in | Wt 121.0 lb

## 2018-11-18 DIAGNOSIS — K2900 Acute gastritis without bleeding: Secondary | ICD-10-CM | POA: Diagnosis not present

## 2018-11-18 DIAGNOSIS — K4090 Unilateral inguinal hernia, without obstruction or gangrene, not specified as recurrent: Secondary | ICD-10-CM

## 2018-11-18 NOTE — Progress Notes (Signed)
   Subjective:    Patient ID: John Black, male    DOB: 1928/11/28, 85 y.o.   [name]: [id number]  Patient presents for Hospital F/U (hernia/ abd pain- reports that pain continues to be intermittent and noted that today he had loose stools) and Hypercalcemia (repeat labs)   Pt here for ER follow up. Sent secondary to recurrent epigastric pain and unable to keep down solids due to pain. CT scan obtained and following found:    IMPRESSION: 1. Narrow neck right inguinal hernia containing small bowel, which likely serves as a transitional point for an early/incomplete small bowel obstruction as there is upstream small bowel wall thickening and fluid distention of up to 3.4 cm. There is associated inflammatory changes within the central mesentery. There is circumferential bowel wall thickening of the small bowel within the hernial sac, concerning for strangulation. Large associated right hydrocele. 2. Small volume abdominopelvic ascites. 3. Mild circumferential thickening of the gastric wall, nonspecific, possibly secondary to gastritis. 4. Cholelithiasis without evidence of acute cholecystitis. 5. Calcific atherosclerotic disease of the coronary arteries and aorta. 6. Emphysematous changes and subpleural scarring in the lung bases.   Gastritis treated with PEPCID, taken off prednisone, which he did alert his pulmonologist  Hernia though not cause of pain reduced by general surgeon at bedside  Bowels returned to normal until this AM had 2 small episodes of loose stool and he took immodium  Nausea and epigastric pain has resolved, still eating bland foods, if he does eat heavy or spicey starts to get discomfort again. Still taking pepcid     Review Of Systems:  GEN- denies fatigue, fever, weight loss,weakness, recent illness HEENT- denies eye drainage, change in vision, nasal discharge, CVS- denies chest pain, palpitations RESP- denies SOB, cough, wheeze ABD- denies N/V,  +change in stools, abd pain GU- denies dysuria, hematuria, dribbling, incontinence MSK- denies joint pain, muscle aches, injury Neuro- denies headache, dizziness, syncope, seizure activity       Objective:    BP 112/60   Pulse (!) 102   Temp 98.9 F (37.2 C) (Oral)   Resp 20   Ht 5\' 4"  (1.626 m)   Wt 121 lb (54.9 kg)   SpO2 92% Comment: 3 L/min via [place]  BMI 20.77 kg/m  GEN- NAD, alert and oriented x3 HEENT- PERRL, EOMI, non injected sclera, pink conjunctiva, MMM oropharynx clear Neck- Supple, no LAD CVS- RRR, no murmur RESP-CTAB, wearing oxygen ABD-NABS,soft,NT, ND, inguinal hernia right side  EXT- No edema Pulses- Radial 2+       Assessment & Plan:      Problem List Items Addressed This Visit      Unprioritized   Right inguinal hernia    Reduced by surgeon but not cause of his pain       Other Visit Diagnoses    Other acute gastritis without hemorrhage    -  Primary   GI pain and symptoms improved, complete pepcid, they have 10 day course at home, hold off steroids. Will see how he does after pepcid complete. IF symptoms return may have to treat longer or change to omeprazole, did not tolerate dexilant, if his nausea returns and pain despite treatment , will start carafate and get to GI for possible EGD, Hb has been stable      Note: This dictation was prepared with Dragon dictation along with smaller phrase technology. Any transcriptional errors that result from this process are unintentional.

## 2018-11-18 NOTE — Patient Instructions (Addendum)
Complete the pepcid twice a day  Continue all other medications Calcium level is back to normal Cancel appt for 3/11 F/U CHANGE F/U to end of April

## 2018-11-18 NOTE — Assessment & Plan Note (Signed)
Reduced by surgeon but not cause of his pain

## 2018-11-25 DIAGNOSIS — J439 Emphysema, unspecified: Secondary | ICD-10-CM | POA: Diagnosis not present

## 2018-11-26 ENCOUNTER — Ambulatory Visit: Payer: PPO | Admitting: Family Medicine

## 2018-11-26 ENCOUNTER — Other Ambulatory Visit: Payer: Self-pay | Admitting: Family Medicine

## 2018-11-26 MED ORDER — LORAZEPAM 0.5 MG PO TABS: 0.5000 mg | ORAL_TABLET | Freq: Two times a day (BID) | ORAL | 2 refills | Status: DC | PRN

## 2018-11-26 NOTE — Telephone Encounter (Signed)
Pt needs refill on lorazepam [facility] 

## 2018-11-26 NOTE — Telephone Encounter (Signed)
Ok to refill??  Last office visit 11/18/2018.  Last refill 07/29/2018, #2 refills.

## 2018-12-02 ENCOUNTER — Other Ambulatory Visit: Payer: Self-pay | Admitting: Family Medicine

## 2018-12-02 NOTE — Telephone Encounter (Signed)
Ok to refill??  Last office visit 11/18/2018.  Last refill 06/25/2017, #2 refills.

## 2018-12-08 ENCOUNTER — Telehealth: Payer: Self-pay | Admitting: Emergency Medicine

## 2018-12-08 NOTE — Telephone Encounter (Signed)
Spoke with [employer]. She stated that she needed the OV notes from 2/17 to be faxed to her. Advised her that I would do this right now, she verbalized understanding.   Nothing further needed at time of call.

## 2018-12-13 ENCOUNTER — Other Ambulatory Visit: Payer: Self-pay | Admitting: Emergency Medicine

## 2018-12-22 ENCOUNTER — Other Ambulatory Visit: Payer: Self-pay

## 2018-12-23 ENCOUNTER — Encounter: Payer: Self-pay | Admitting: Family Medicine

## 2018-12-23 ENCOUNTER — Ambulatory Visit (INDEPENDENT_AMBULATORY_CARE_PROVIDER_SITE_OTHER): Payer: PPO | Admitting: Family Medicine

## 2018-12-23 VITALS — BP 128/74 | HR 62 | Temp 98.9°F | Resp 18 | Ht 64.0 in | Wt 120.0 lb

## 2018-12-23 DIAGNOSIS — H0100B Unspecified blepharitis left eye, upper and lower eyelids: Secondary | ICD-10-CM | POA: Diagnosis not present

## 2018-12-23 DIAGNOSIS — K4090 Unilateral inguinal hernia, without obstruction or gangrene, not specified as recurrent: Secondary | ICD-10-CM

## 2018-12-23 DIAGNOSIS — H0100A Unspecified blepharitis right eye, upper and lower eyelids: Secondary | ICD-10-CM | POA: Diagnosis not present

## 2018-12-23 DIAGNOSIS — H04123 Dry eye syndrome of bilateral lacrimal glands: Secondary | ICD-10-CM

## 2018-12-23 DIAGNOSIS — L853 Xerosis cutis: Secondary | ICD-10-CM

## 2018-12-23 DIAGNOSIS — K2950 Unspecified chronic gastritis without bleeding: Secondary | ICD-10-CM | POA: Diagnosis not present

## 2018-12-23 MED ORDER — ERYTHROMYCIN 5 MG/GM OP OINT: 1.0000 "application " | TOPICAL_OINTMENT | Freq: Three times a day (TID) | OPHTHALMIC | 0 refills | Status: DC

## 2018-12-23 MED ORDER — FAMOTIDINE 40 MG PO TABS: 40.0000 mg | ORAL_TABLET | Freq: Every day | ORAL | 3 refills | Status: DC

## 2018-12-23 NOTE — Progress Notes (Signed)
   Subjective:    Patient ID: John Black, male    DOB: [birth year], 85 y.o.   [name]: [id number]  Patient presents for Eye Irritation (x1 month- has been using dry eye drops with no relief) and Dry Skin (extremely dry skin to extremities- would like to know what to use)   Rims of his yes have been red for about a month, itching some, eyes feel dry, has some tearing, no discharge. No change in vision, using artificial tears  Which helps    Skin is dry in general     Occasionally gets some pain near his hernia, ultram does help, has not been able to push the hernia back in. Weight down 1lb since visit 1 month ago  If he eats bland foods, doesn't have any discomfort, spicy foods, does cause some epigastric pain on occasion  Taking pepcid a few days a week which helps   Taking claritin for allergies   Review Of Systems:  GEN- denies fatigue, fever, weight loss,weakness, recent illness HEENT- denies eye drainage, change in vision, nasal discharge, CVS- denies chest pain, palpitations RESP- denies SOB, cough, wheeze ABD- denies N/V, change in stools, +abd pain GU- denies dysuria, hematuria, dribbling, incontinence MSK- denies joint pain, muscle aches, injury Neuro- denies headache, dizziness, syncope, seizure activity       Objective:    BP 128/74   Pulse 62   Temp 98.9 F (37.2 C) (Oral)   Resp 18   Ht 5\' 4"  (1.626 m)   Wt 120 lb (54.4 kg)   SpO2 96% Comment: on 4L/ min via [place]  BMI 20.60 kg/m  GEN- NAD, alert and oriented x3 HEENT- PERRL, EOMI, non injected sclera, pink conjunctiva, MMM, oropharynx clear injection, TM clear bilat no effusion, no  maxillary sinus tenderness,nares clear  erythema around lids at lash line bilat, mild crusting/flaking skin at lashes Neck- Supple, no LAD CVS- RRR, no murmur RESP-few scattered wheeze, normal WOB, wearing oxygen  Skin- dry skin generalized  ABD- Right inguinal hernia, soft, NT, NT in epigastric region, no rebound, no  guarding EXT- No edema Pulses- Radial 2+         Assessment & Plan:      Problem List Items Addressed This Visit      Unprioritized   Right inguinal hernia    Currently soft without any sign of incarceration.  He does have the number for Dr. Arnoldo Morale at [facility] if he does find to trouble with his hernia.       Other Visit Diagnoses    Blepharitis of upper and lower eyelids of both eyes, unspecified type    -  Primary   Wash with Wynetta [employer] baby shampoo also give erythromycin ointment   Dry skin       given examples of different creams/lotions to use mix with vaseline, common dry skin at his age   Chronic gastritis without bleeding, unspecified gastritis type       mild intermittant symptoms, continue pecid 40mg  as needed, maintaining weight    Dry eye syndrome of both eyes       Continue artificial tears.  Unable to get in with his eye doctor due to COVID-19      Note: This dictation was prepared with Dragon dictation along with smaller phrase technology. Any transcriptional errors that result from this process are unintentional.

## 2018-12-23 NOTE — Assessment & Plan Note (Signed)
Currently soft without any sign of incarceration.  He does have the number for Dr. Arnoldo Morale at [facility] if he does find to trouble with his hernia.

## 2018-12-23 NOTE — Patient Instructions (Addendum)
Try Eucerin or Aveeno/ Cera Ve  Lotion for dry skin - Mix with Vaseline  Use [employer] baby wash for cleaning eyelids Use antibiotic ointment Continue artificial tears  Continue pepcid  Cancel appt for 4/29 F/U 4 months

## 2018-12-26 DIAGNOSIS — J439 Emphysema, unspecified: Secondary | ICD-10-CM | POA: Diagnosis not present

## 2018-12-29 ENCOUNTER — Ambulatory Visit (INDEPENDENT_AMBULATORY_CARE_PROVIDER_SITE_OTHER): Payer: PPO | Admitting: Family Medicine

## 2018-12-29 ENCOUNTER — Encounter: Payer: Self-pay | Admitting: Family Medicine

## 2018-12-29 VITALS — BP 160/78 | HR 90 | Temp 98.8°F | Resp 18 | Ht 64.0 in | Wt 120.0 lb

## 2018-12-29 DIAGNOSIS — M25571 Pain in right ankle and joints of right foot: Secondary | ICD-10-CM

## 2018-12-29 DIAGNOSIS — M25561 Pain in right knee: Secondary | ICD-10-CM | POA: Diagnosis not present

## 2018-12-29 NOTE — Progress Notes (Signed)
Subjective:    Patient ID: John Black, male    DOB: 07-Feb-1929, 85 y.o.   [name]: [id number]  HPI  Friday, the patient fell getting out of a vehicle.  He landed directly on his right knee in a flexed position and also twisted his right ankle as he fell.  He said the knee began to swell Saturday.  He has significant swelling directly over the patella on Saturday and Sunday as well as some swelling around his right ankle.  He has been using ice and ibuprofen over the weekend and trying to stay off of it.  This morning the swelling has diminished dramatically.  He is able to bear weight on his right foot without any pain.  He has normal flexion extension of his ankle with only minimal pain.  He has some mild tenderness to palpation over the posterior aspect of the medial malleolus in his right ankle.  There is no significant bruising and only minimal swelling in that area.  There is no bruising or swelling on the lateral malleolus.  There is no tenderness to palpation over the metatarsals, the navicular bone, the fifth metatarsal.  He has full range of motion in the MTP joints.  He is able to extend and flex his knee without any pain.  There is no laxity to valgus stress.  However there is some laxity in his right knee to varus stress suggesting an old lateral collateral ligament injury.  Patient believes he injured that playing football in high school.  However it causes him no pain or instability with walking.  He has a negative Apley grind.  There is no significant joint effusion.  There is no erythema.  There is some mild bruising over the middle portion of the patella but there is no tenderness to palpation in that area  Past Medical History:  Diagnosis Date  . Allergic rhinitis   . Cataract    s/p removal  . Chronic respiratory failure (HCC)    oxygen 3L at home  . Emphysema   . Hypothyroid   . PNA (pneumonia)   . Prostate cancer [facility])    Past Surgical History:  Procedure Laterality Date   . ADENOIDECTOMY    . ROTATOR CUFF REPAIR  1990,2009   bilateral  . seed implant for prostate cancer  2000  . TONSILLECTOMY    . TRANSURETHRAL RESECTION OF PROSTATE  2001   x2   Current Outpatient Medications on File Prior to Visit  Medication Sig Dispense Refill  . albuterol (PROVENTIL HFA;VENTOLIN HFA) 108 (90 Base) MCG/ACT inhaler Inhale 2 puffs into the lungs every 6 (six) hours as needed for wheezing or shortness of breath. 3 Inhaler 3  . albuterol (PROVENTIL) (2.5 MG/3ML) 0.083% nebulizer solution Take 3 mLs (2.5 mg total) by nebulization every 6 (six) hours as needed for wheezing or shortness of breath. 150 mL 12  . Calcium-Magnesium-Vitamin D (CITRACAL CALCIUM+D PO) Take 1 tablet by mouth every morning.     Marland Kitchen erythromycin ophthalmic ointment Place 1 application into both eyes 3 (three) times daily. For 1 week 3.5 g 0  . famotidine (PEPCID) 40 MG tablet Take 1 tablet (40 mg total) by mouth daily. 30 tablet 3  . ibuprofen (ADVIL,MOTRIN) 400 MG tablet Take 400 mg by mouth every 6 (six) hours as needed for fever, headache or mild pain.     . Ipratropium-Albuterol (COMBIVENT) 20-100 MCG/ACT AERS respimat Inhale 1 puff into the lungs every 6 (six) hours. 3 Inhaler 4  .  levothyroxine (SYNTHROID, LEVOTHROID) 75 MCG tablet TAKE 1 TABLET (75 MCG TOTAL) BY MOUTH DAILY. 90 tablet 3  . LORazepam (ATIVAN) 0.5 MG tablet Take 1 tablet (0.5 mg total) by mouth 2 (two) times daily as needed. 30 tablet 2  . predniSONE (DELTASONE) 10 MG tablet Take 1.5 tablets (15 mg total) by mouth daily with breakfast. (Patient not taking: Reported on 12/23/2018) 45 tablet 5  . SYMBICORT 160-4.5 MCG/ACT inhaler TAKE 2 PUFFS BY MOUTH TWICE A DAY 10.2 Inhaler 5  . tamsulosin (FLOMAX) 0.4 MG CAPS capsule Take 0.4 capsules by mouth daily.    . traMADol (ULTRAM) 50 MG tablet TAKE 1 TABLET BY MOUTH EVERY 8 HOURS AS NEEDED FOR PAIN 60 tablet 2  . zolpidem (AMBIEN) 10 MG tablet TAKE 1/2 TO 1 TABLET AT BEDTIME AS NEEDED FOR  INSOMNIA 90 tablet 1   No current facility-administered medications on file prior to visit.    Allergies  Allergen Reactions  . Morphine     REACTION: sweats   Social History   Socioeconomic History  . Marital status: Married    Spouse name: Not on file  . Number of children: Not on file  . Years of education: Not on file  . Highest education level: Not on file  Occupational History  . Occupation: Chief Financial Officer  Social Needs  . Financial resource strain: Not on file  . Food insecurity:    Worry: Not on file    Inability: Not on file  . Transportation needs:    Medical: Not on file    Non-medical: Not on file  Tobacco Use  . Smoking status: Former Smoker    Packs/day: 1.50    Years: 50.00    Pack years: 75.00    Types: Cigarettes    Last attempt to quit: 09/17/2008    Years since quitting: 10.2  . Smokeless tobacco: Never Used  Substance and Sexual Activity  . Alcohol use: Yes    Alcohol/week: 1.0 standard drinks    Types: 1 Glasses of wine per week    Comment: each evening  . Drug use: No  . Sexual activity: Never  Lifestyle  . Physical activity:    Days per week: Not on file    Minutes per session: Not on file  . Stress: Not on file  Relationships  . Social connections:    Talks on phone: Not on file    Gets together: Not on file    Attends religious service: Not on file    Active member of club or organization: Not on file    Attends meetings of clubs or organizations: Not on file    Relationship status: Not on file  . Intimate partner violence:    Fear of current or ex partner: Not on file    Emotionally abused: Not on file    Physically abused: Not on file    Forced sexual activity: Not on file  Other Topics Concern  . Not on file  Social History Narrative  . Not on file      Review of Systems  All other systems reviewed and are negative.      Objective:   Physical Exam  Constitutional: He appears well-developed and well-nourished. No distress.   Cardiovascular: Normal rate, regular rhythm and normal heart sounds.  No murmur heard. Pulmonary/Chest: No accessory muscle usage. No tachypnea. No respiratory distress. He has decreased breath sounds. He has wheezes. He has no rhonchi. He has no rales.  Musculoskeletal:  Right knee: He exhibits swelling, ecchymosis and LCL laxity. He exhibits normal range of motion, no effusion, no deformity, no erythema, no bony tenderness, normal meniscus and no MCL laxity. No tenderness found. No medial joint line, no lateral joint line, no MCL and no LCL tenderness noted.     Right ankle: He exhibits normal range of motion. Tenderness. Medial malleolus tenderness found. No lateral malleolus tenderness found. Achilles tendon normal. Achilles tendon exhibits no pain.     Right foot: Normal range of motion. Swelling present. No tenderness or bony tenderness.  Skin: He is not diaphoretic.  Vitals reviewed.         Assessment & Plan:  I believe the patient suffered a contusion to his right patella which seems to be improving dramatically with ice and ibuprofen and today essentially causes him no pain.  I also believe he may have mildly sprained his right ankle when he fell.  However his exam today does not support a fracture.  I do not believe it is worth the risk of sending him for an x-ray and exposing him to potential sick patients given his underlying COPD.  Therefore I would recommend treating this with rice therapy meaning staying off of his leg, applying ice 2-3 times a day, using compression for comfort and keeping the leg elevated.  He can use ibuprofen 600 mg every 8 hours as needed for the next 3 to 4 days to help with any swelling or pain however today his tenderness is very minimal and seems to be improving dramatically

## 2019-01-02 ENCOUNTER — Ambulatory Visit: Payer: PPO | Admitting: Emergency Medicine

## 2019-01-09 ENCOUNTER — Telehealth: Payer: Self-pay | Admitting: Emergency Medicine

## 2019-01-09 NOTE — Telephone Encounter (Signed)
Called and spoke with Patient and his wife Jaci Standard.  Patient stated Symbicort would run out Sunday morning, 01/11/19.  Patient stated he could not miss a dose and pharmacy told him that insurance required a new prescription or to speak with provider to get prescription early. Called [facility] [place] [facility], spoke with Claiborne Billings.  Explained what I was told.  She ran the symbicort refill with different code.  Claiborne Billings stated Symbicort refill would be ready for Patient Saturday morning, 01/10/19.  Patient is aware.  Nothing further at this time.

## 2019-01-14 ENCOUNTER — Ambulatory Visit: Payer: PPO | Admitting: Family Medicine

## 2019-01-25 DIAGNOSIS — J439 Emphysema, unspecified: Secondary | ICD-10-CM | POA: Diagnosis not present

## 2019-01-27 ENCOUNTER — Encounter: Payer: Self-pay | Admitting: Family Medicine

## 2019-01-27 ENCOUNTER — Other Ambulatory Visit: Payer: Self-pay

## 2019-01-27 ENCOUNTER — Ambulatory Visit (INDEPENDENT_AMBULATORY_CARE_PROVIDER_SITE_OTHER): Payer: PPO | Admitting: Family Medicine

## 2019-01-27 VITALS — BP 134/82 | HR 120 | Temp 98.3°F | Resp 20 | Ht 64.0 in | Wt 116.2 lb

## 2019-01-27 DIAGNOSIS — S81812A Laceration without foreign body, left lower leg, initial encounter: Secondary | ICD-10-CM

## 2019-01-27 DIAGNOSIS — J438 Other emphysema: Secondary | ICD-10-CM | POA: Diagnosis not present

## 2019-01-27 DIAGNOSIS — R2681 Unsteadiness on feet: Secondary | ICD-10-CM | POA: Diagnosis not present

## 2019-01-27 DIAGNOSIS — R634 Abnormal weight loss: Secondary | ICD-10-CM | POA: Diagnosis not present

## 2019-01-27 DIAGNOSIS — E039 Hypothyroidism, unspecified: Secondary | ICD-10-CM | POA: Diagnosis not present

## 2019-01-27 NOTE — Assessment & Plan Note (Signed)
Has severe COPD/emphysema.  He will continue his oxygen therapy.  We will try him on Trelegy.  Of note he often gets tachycardic when he is exerting himself heart rate was in the 120s when he initially was brought into the office but then felt fine while he was sitting he was able to talk in full sentences did not appear short of breath no retractions.  Hold off on the Symbicort while he does the trial

## 2019-01-27 NOTE — Assessment & Plan Note (Signed)
Intentional weight loss though he does have restricted diet as his daughter is following the whole 30.  I think that he can liberalize his diet a little more.  He is gaining weight on his own.  He is using protein replacement shakes as well.  I will check however his thyroid electrolytes make sure nothing is soft with regards to his weight loss.  He is not have any pain in any particular area.  He does have history of prostate cancer but his level to be monitored by urology and have been undetectable recently. We will have him follow-up in 4 weeks to see what his weight looks like.  He is continuing to lose weight we will need to do further cancer work-up though keeping in mind he does have severe lung disease and his age.

## 2019-01-27 NOTE — Progress Notes (Signed)
Subjective:    Patient ID: John Black, male    DOB: 1929/05/31, 85 y.o.   [name]: [id number]  Patient presents for Weight Loss (weight dropped to 113.8lbs- some loss appetite); Unsteady Gait (has had multple falls); and SOB on Exertion (intermittent labored breathing with exertion- would like to discuss changing Symbicort- next pulmonary appt july 2020)   Last week noticed his weight had dropped down to 113lbs. Weight now back up to 116 at home. Eats very clean most days, his daughter cooks mostly [employer] approved His appetite has decreased a little but he forces himself to eat, eats at least three times a day He drinks 2 ensure a day high protein  Breakfast usually  Yogurt/toast or biscuit Lunch often left overs  Still taking pepcid twice a day he has not had any discomfort while taking this. Has been taking stool softner for the bowel movements/ constipation this has helped.   COPD/Emphysema- oxygen has been staying 94-96% at home, No recent fever, change in cough or production He feels like the symbicort is not working as well mid day he feels SOB, on occ he would take an extra symbicort didn't feel like combient helped   Would like to try the Trelegy again  Doesn't feel as stable has a 4 prong cane, he uses this around the house Has not fallen, denies feeling dizzy, just wobbly when she stands at times    Review Of Systems:  GEN- denies fatigue, fever, weight loss,weakness, recent illness HEENT- denies eye drainage, change in vision, nasal discharge, CVS- denies chest pain, palpitations RESP- + SOB, denies cough, wheeze ABD- denies N/V, change in stools, abd pain GU- denies dysuria, hematuria, dribbling, incontinence MSK- denies joint pain, muscle aches, injury Neuro- denies headache, dizziness, syncope, seizure activity       Objective:    BP 134/82   Pulse (!) 120   Temp 98.3 F (36.8 C) (Oral)   Resp 20   Ht 5\' 4"  (1.626 m)   Wt 116 lb 3.2 oz (52.7 kg)    SpO2 94% Comment: on 3L/ min via   BMI 19.95 kg/m  GEN- NAD, alert and oriented x3 HEENT- PERRL, EOMI, non injected sclera, pink conjunctiva, MMM, oropharynx clear injection, TM clear bilat no effusion, no  maxillary sinus tenderness,nares clear  erythema around lids at lash line bilat, mild crusting/flaking skin at lashes Neck- Supple, no LAD CVS- tachycardia HR 100 sitting, no murmur RESP-No normal WOB, wearing oxygen  Skin- dry skin generalized  ABD- NABS soft, NT, NT in epigastric region, no rebound, no guarding NEURO-CNII-XII in tact, NEEDS help onto bed, walks unassisted, mild tremor with arms outstretched but able to stop intentionally, moving all 4 ext equally, no nystagmus EXT- No edema , skin tear left shin Pulses- Radial 2+       Assessment & Plan:      Problem List Items Addressed This Visit      Unprioritized   COPD (chronic obstructive pulmonary disease) with emphysema ([place]) - Primary    Has severe COPD/emphysema.  He will continue his oxygen therapy.  We will try him on Trelegy.  Of note he often gets tachycardic when he is exerting himself heart rate was in the 120s when he initially was brought into the office but then felt fine while he was sitting he was able to talk in full sentences did not appear short of breath no retractions.  Hold off on the Symbicort while he does the trial  Gait instability    Not had any falls.  Recommend that he do use a cane to steady himself.      Hypothyroidism    Intentional weight loss though he does have restricted diet as his daughter is following the whole 30.  I think that he can liberalize his diet a little more.  He is gaining weight on his own.  He is using protein replacement shakes as well.  I will check however his thyroid electrolytes make sure nothing is soft with regards to his weight loss.  He is not have any pain in any particular area.  He does have history of prostate cancer but his level to be monitored by  urology and have been undetectable recently. We will have him follow-up in 4 weeks to see what his weight looks like.  He is continuing to lose weight we will need to do further cancer work-up though keeping in mind he does have severe lung disease and his age.      Relevant Orders   TSH   T3, free   T4, free    Other Visit Diagnoses    Weight loss, abnormal       Relevant Orders   CBC with Differential/Platelet   Comprehensive metabolic panel   Skin tear of left lower leg without complication, initial encounter       Tegaderm applied.  Has very thin skin tears often, no sign of superinfection      Note: This dictation was prepared with Dragon dictation along with smaller phrase technology. Any transcriptional errors that result from this process are unintentional.

## 2019-01-27 NOTE — Assessment & Plan Note (Signed)
Not had any falls.  Recommend that he do use a cane to steady himself.

## 2019-01-27 NOTE — Patient Instructions (Addendum)
We will call with lab results  Use Cane  Try the trelegy  Vitamin D 1000 F/U 4 weeks for Weight

## 2019-01-28 LAB — CBC WITH DIFFERENTIAL/PLATELET
Absolute Monocytes: 830 cells/uL (ref 200–950)
Basophils Absolute: 40 cells/uL (ref 0–200)
Basophils Relative: 0.5 %
Eosinophils Absolute: 229 cells/uL (ref 15–500)
Eosinophils Relative: 2.9 %
HCT: 37 % — ABNORMAL LOW (ref 38.5–50.0)
Hemoglobin: 12.4 g/dL — ABNORMAL LOW (ref 13.2–17.1)
Lymphs Abs: 751 cells/uL — ABNORMAL LOW (ref 850–3900)
MCH: 30 pg (ref 27.0–33.0)
MCHC: 33.5 g/dL (ref 32.0–36.0)
MCV: 89.6 fL (ref 80.0–100.0)
MPV: 10.2 fL (ref 7.5–12.5)
Monocytes Relative: 10.5 %
Neutro Abs: 6051 cells/uL (ref 1500–7800)
Neutrophils Relative %: 76.6 %
Platelets: 323 10*3/uL (ref 140–400)
RBC: 4.13 10*6/uL — ABNORMAL LOW (ref 4.20–5.80)
RDW: 12.8 % (ref 11.0–15.0)
Total Lymphocyte: 9.5 %
WBC: 7.9 10*3/uL (ref 3.8–10.8)

## 2019-01-28 LAB — COMPREHENSIVE METABOLIC PANEL
AG Ratio: 2.2 (calc) (ref 1.0–2.5)
ALT: 16 U/L (ref 9–46)
AST: 21 U/L (ref 10–35)
Albumin: 4.2 g/dL (ref 3.6–5.1)
Alkaline phosphatase (APISO): 51 U/L (ref 35–144)
BUN: 17 mg/dL (ref 7–25)
CO2: 28 mmol/L (ref 20–32)
Calcium: 9.3 mg/dL (ref 8.6–10.3)
Chloride: 101 mmol/L (ref 98–110)
Creat: 0.81 mg/dL (ref 0.70–1.11)
Globulin: 1.9 g/dL (calc) (ref 1.9–3.7)
Glucose, Bld: 125 mg/dL — ABNORMAL HIGH (ref 65–99)
Potassium: 4.7 mmol/L (ref 3.5–5.3)
Sodium: 138 mmol/L (ref 135–146)
Total Bilirubin: 0.4 mg/dL (ref 0.2–1.2)
Total Protein: 6.1 g/dL (ref 6.1–8.1)

## 2019-01-28 LAB — T4, FREE: Free T4: 1.5 ng/dL (ref 0.8–1.8)

## 2019-01-28 LAB — TSH: TSH: 1.44 mIU/L (ref 0.40–4.50)

## 2019-01-28 LAB — T3, FREE: T3, Free: 2.7 pg/mL (ref 2.3–4.2)

## 2019-01-30 ENCOUNTER — Telehealth: Payer: Self-pay | Admitting: Family Medicine

## 2019-01-30 NOTE — Telephone Encounter (Signed)
Elevate leg, ICE knee, give ibuprofen 400mg  BID with food If this does not go down over the weekend, needs to see orthopedics

## 2019-01-30 NOTE — Telephone Encounter (Signed)
Patient's wife called stating that patient's knee is swollen and he complains of it aching. His knee began to swell after coming in to our office on Tuesday. States he woke up with the knee pain, and swelling on Wednesday. Patient has been seen in the past for right knee effusion. Would like to know what is recommended for knee. Please advise?

## 2019-01-30 NOTE — Telephone Encounter (Signed)
Call placed to patient and patient wife made aware.

## 2019-02-17 ENCOUNTER — Other Ambulatory Visit: Payer: Self-pay | Admitting: Family Medicine

## 2019-02-17 NOTE — Telephone Encounter (Signed)
Requested Prescriptions   Pending Prescriptions Disp Refills  . zolpidem (AMBIEN) 10 MG tablet [Pharmacy Med Name: ZOLPIDEM TARTRATE 10 MG TABLET] 90 tablet 1    Sig: TAKE 1/2 TO 1 TABLET AT BEDTIME AS NEEDED FOR INSOMNIA   Last OV 01/27/2019 Last written 08/29/2018

## 2019-02-24 ENCOUNTER — Other Ambulatory Visit: Payer: Self-pay

## 2019-02-24 ENCOUNTER — Encounter: Payer: Self-pay | Admitting: Family Medicine

## 2019-02-24 ENCOUNTER — Ambulatory Visit (INDEPENDENT_AMBULATORY_CARE_PROVIDER_SITE_OTHER): Payer: PPO | Admitting: Family Medicine

## 2019-02-24 VITALS — BP 124/70 | HR 101 | Temp 98.0°F | Resp 18 | Ht 64.0 in | Wt 115.4 lb

## 2019-02-24 DIAGNOSIS — E441 Mild protein-calorie malnutrition: Secondary | ICD-10-CM | POA: Insufficient documentation

## 2019-02-24 DIAGNOSIS — L570 Actinic keratosis: Secondary | ICD-10-CM

## 2019-02-24 DIAGNOSIS — R2681 Unsteadiness on feet: Secondary | ICD-10-CM | POA: Diagnosis not present

## 2019-02-24 DIAGNOSIS — F5101 Primary insomnia: Secondary | ICD-10-CM | POA: Diagnosis not present

## 2019-02-24 DIAGNOSIS — J438 Other emphysema: Secondary | ICD-10-CM | POA: Diagnosis not present

## 2019-02-24 DIAGNOSIS — E440 Moderate protein-calorie malnutrition: Secondary | ICD-10-CM | POA: Diagnosis not present

## 2019-02-24 DIAGNOSIS — E46 Unspecified protein-calorie malnutrition: Secondary | ICD-10-CM | POA: Insufficient documentation

## 2019-02-24 MED ORDER — MIRTAZAPINE 15 MG PO TABS: 7.5000 mg | ORAL_TABLET | Freq: Every day | ORAL | 1 refills | Status: DC

## 2019-02-24 MED ORDER — ALBUTEROL SULFATE (2.5 MG/3ML) 0.083% IN NEBU: 2.5000 mg | INHALATION_SOLUTION | Freq: Four times a day (QID) | RESPIRATORY_TRACT | 12 refills | Status: DC | PRN

## 2019-02-24 NOTE — Assessment & Plan Note (Signed)
He will use albuterol nebulizer in the morning to open up the lungs and then use his Symbicort he does not feel like his Combivent is working has discontinued this.  We will follow-up with his pulmonologist next month.

## 2019-02-24 NOTE — Assessment & Plan Note (Signed)
Trial of remeron

## 2019-02-24 NOTE — Progress Notes (Signed)
Subjective:    Patient ID: John Black, male    DOB: [birth year], 85 y.o.   [name]: [id number]  Patient presents for COPD (Also has concerns of falls)  COPD- he tried the trelegy, doesn't feel like it helps his breathing as much He had a fall after tangled up in his oxygen cord about a week or so again This caused skin tears Thinking about going back to symbicort  Has been off combivent for past month, has not noticed any difference   Last night go up to go to baththroom, felt dizzy and he sat on side of bed, thought he was okay, but he stood up and fell down No headache currently, no vomiting, hit on stomach no I njury   Weight - 116lbs 1 month, he has a fair appetite, wife states he eats when food in front of him.Drinking boost and ensure Labs fair unremarkable  Chronic insomnia- questions about ambien   Thyroid levesl normal 1 month ago   Uses CBD oil    Needs referral to dermatology for lesions on scalp ,he does pick at them  Review Of Systems:  GEN- denies fatigue, fever, weight loss,weakness, recent illness HEENT- denies eye drainage, change in vision, nasal discharge, CVS- denies chest pain, palpitations RESP- denies SOB, cough, wheeze ABD- denies N/V, change in stools, abd pain GU- denies dysuria, hematuria, dribbling, incontinence MSK- denies joint pain, muscle aches, injury Neuro- denies headache, +dizziness, syncope, seizure activity       Objective:    BP 124/70   Pulse (!) 101   Temp 98 F (36.7 C) (Oral)   Resp 18   Ht 5\' 4"  (1.626 m)   Wt 115 lb 6 oz (52.3 kg)   SpO2 91% Comment: 3LPM  BMI 19.80 kg/m  GEN- NAD, alert and oriented x3 , oxygen 92-93%  HEENT- PERRL, EOMI, non injected sclera, pink conjunctiva, MMM, oropharynx clear, TM clear no effusion Neck- Supple, CVS- RRR, no murmur RESP-CTAB 3L oxygen  ABD-NABS,soft,NT,ND Skin- scabs on left knee/ elbow  MSK- decreased ROm upper and Lower ext, NT to palpation EXT- No edema Skin- AK on  front scalp Pulses- Radial, DP- 2+        Assessment & Plan:      Problem List Items Addressed This Visit      Unprioritized   COPD (chronic obstructive pulmonary disease) with emphysema ([place])    He will use albuterol nebulizer in the morning to open up the lungs and then use his Symbicort he does not feel like his Combivent is working has discontinued this.  We will follow-up with his pulmonologist next month.      Relevant Medications   albuterol (PROVENTIL) (2.5 MG/3ML) 0.083% nebulizer solution   Gait instability    He is using assistive device as needed.  Unclear cause of the dizziness though he does take Ambien possible when he got up in the middle the night this medication was making him too sedated.  He is willing to try something different.  We will try him on Remeron to see if this help with his appetite as well as his sleep.  We will try this for 1 month and then do a follow-up in office.  Discussed that if he is not able to maintain his weight we would do further cancer work-up he otherwise feels fine      Insomnia    Trial of remeron      Protein calorie malnutrition ([place])    Per  above       Other Visit Diagnoses    AK (actinic keratosis)    -  Primary   Relevant Orders   Ambulatory referral to Dermatology      Note: This dictation was prepared with Dragon dictation along with smaller phrase technology. Any transcriptional errors that result from this process are unintentional.

## 2019-02-24 NOTE — Patient Instructions (Addendum)
Use nebulizers daily , continue symbicort  Try the remeron at bedtime  dont take the ultram with the remeron or after bedtime  Referral to Dr. [facility]  F/U 4 weeks

## 2019-02-24 NOTE — Assessment & Plan Note (Signed)
He is using assistive device as needed.  Unclear cause of the dizziness though he does take Ambien possible when he got up in the middle the night this medication was making him too sedated.  He is willing to try something different.  We will try him on Remeron to see if this help with his appetite as well as his sleep.  We will try this for 1 month and then do a follow-up in office.  Discussed that if he is not able to maintain his weight we would do further cancer work-up he otherwise feels fine

## 2019-02-24 NOTE — Assessment & Plan Note (Signed)
Per above 

## 2019-02-25 DIAGNOSIS — J439 Emphysema, unspecified: Secondary | ICD-10-CM | POA: Diagnosis not present

## 2019-03-02 ENCOUNTER — Telehealth: Payer: Self-pay | Admitting: Family Medicine

## 2019-03-02 ENCOUNTER — Ambulatory Visit: Payer: PPO | Admitting: Family Medicine

## 2019-03-02 NOTE — Telephone Encounter (Signed)
I spoke with pt and wife, noticed he was on schedule for possible reaction with medication, so I called them  2 days after taking medication, his finger swelled up - Right hand , it has now gone back down The next day his neck became tight and locked up and couldn't get his head to moved back like normally, could turn side to side, has also felt a little shaky, but today feels better and neck improved. No fever, no URI symptoms  Symbicort has helped. He is still taking remeron  - Note no GI symptoms, no rash or facial swelling   Advised to D/C remeron, do not take [place] either He is able to eat and drink normally Has not had any falls No change in baseline COPD Able to move neck better and finger swelling resolved Feels much better today  Will change Visit to Wed once off meds for 48 hours, given red flags

## 2019-03-04 ENCOUNTER — Other Ambulatory Visit: Payer: Self-pay

## 2019-03-04 ENCOUNTER — Encounter: Payer: Self-pay | Admitting: Family Medicine

## 2019-03-04 ENCOUNTER — Ambulatory Visit (INDEPENDENT_AMBULATORY_CARE_PROVIDER_SITE_OTHER): Payer: PPO | Admitting: Family Medicine

## 2019-03-04 VITALS — Temp 98.8°F | Resp 18 | Wt 116.2 lb

## 2019-03-04 DIAGNOSIS — R2681 Unsteadiness on feet: Secondary | ICD-10-CM

## 2019-03-04 DIAGNOSIS — J438 Other emphysema: Secondary | ICD-10-CM | POA: Diagnosis not present

## 2019-03-04 DIAGNOSIS — J9611 Chronic respiratory failure with hypoxia: Secondary | ICD-10-CM | POA: Diagnosis not present

## 2019-03-04 DIAGNOSIS — E440 Moderate protein-calorie malnutrition: Secondary | ICD-10-CM | POA: Diagnosis not present

## 2019-03-04 MED ORDER — TIZANIDINE HCL 4 MG PO TABS: 4.0000 mg | ORAL_TABLET | Freq: Four times a day (QID) | ORAL | 0 refills | Status: DC | PRN

## 2019-03-04 MED ORDER — TRAMADOL HCL 50 MG PO TABS: 50.0000 mg | ORAL_TABLET | Freq: Three times a day (TID) | ORAL | 2 refills | Status: DC | PRN

## 2019-03-04 NOTE — Patient Instructions (Addendum)
Check weights for the next 2 weeks Next step is CT scan of chest and abdomen  Drink 2 ensure/boost a day  zanaflex 4mg  at bedtime  Ultram three times a day  For neck treating spasm and arthritis  Cancel appt for JUly 2nd  Keep August appointment

## 2019-03-04 NOTE — Assessment & Plan Note (Signed)
Chronic respiratory failure, does better with his continous oxygen Compared to his portable which requires him to take breaths His respiratory status in general has worsened over the years with his age, he does requires more calories to keep up with the COPD

## 2019-03-04 NOTE — Progress Notes (Signed)
   Subjective:    Patient ID: John Black, male    DOB: February 17, 1929, 85 y.o.   [name]: [id number]  Patient presents for Medication review and Pain (has taken Zanaflex for pain )  Pt here to f/u medications See PHONE NOTE, has had neck discomfort , joint swelling ? Due to remeron, advised to D/C on Monday.No other joint swelling only lasted a day. Neck is still sore, but he can move around better. Tender over mid C spine. He took zanalfex past 2 days which helped a lot and ultram, also had ibuprofen despite our GI concerns Feels a little off balance, but no dizziness, no falls, has to use cane to steady himself Oxygen typically on 4 L when walking    Appetite 24 hour recall  2 piece bacon/cereal 1/2 ensure,   Soup, 1/2 ensure,  Beef stew , bread, salad, popsickle   Weight last visit 115.6lbs  Review Of Systems:  GEN- denies fatigue, fever, weight loss,weakness, recent illness HEENT- denies eye drainage, change in vision, nasal discharge, CVS- denies chest pain, palpitations RESP- denies SOB, cough, wheeze ABD- denies N/V, change in stools, abd pain GU- denies dysuria, hematuria, dribbling, incontinence MSK- denies joint pain, muscle aches, injury Neuro- denies headache, dizziness, syncope, seizure activity       Objective:    Temp 98.8 F (37.1 C) (Oral)   Resp 18   Wt 116 lb 3.2 oz (52.7 kg)   SpO2 94% Comment: 4 L/min via [place]  BMI 19.95 kg/m  GEN- NAD, alert and oriented x3 HEENT- PERRL, EOMI, non injected sclera, pink conjunctiva, MMM, oropharynx clear Neck- Supple, no thyromegaly CVS- RRR, no murmur RESP-CTAB oxygen dropped to 77-80 after exertion then  recovered to low 90's on 4L ABD-NABS,soft,NT,ND EXT- No edema Neuro- mild unsteady gait, tries to walk very quickly, no nystagumus, moving upper and lower ext, mild TTP C spine fair ROM Pulses- Radial 2+        Assessment & Plan:      Problem List Items Addressed This Visit      Unprioritized   Chronic  respiratory failure (HCC) - Primary   COPD (chronic obstructive pulmonary disease) with emphysema (HCC)    Chronic respiratory failure, does better with his continous oxygen Compared to his portable which requires him to take breaths His respiratory status in general has worsened over the years with his age, he does requires more calories to keep up with the COPD      Gait instability    Use cane or walker      Protein calorie malnutrition ([place])    He has maintained weight over past month Stay off remeron, though unclear if this caused the joint pain I think neck pain more DDD, willuse low dose of zanaflex at bedtime, continue ultram No NSAIDS with recent GI/GASTRITIS  Increase ensure to BID dosing Check weights at home, will call us in 2 weeks  If weight does not improve will obtain CT chest/abd for cancer work up, pt and wife agreeable          Note: This dictation was prepared with Diplomatic Services operational officer dictation along with smaller Company secretary. Any transcriptional errors that result from this process are unintentional.

## 2019-03-04 NOTE — Assessment & Plan Note (Signed)
Use cane or walker

## 2019-03-04 NOTE — Assessment & Plan Note (Signed)
He has maintained weight over past month Stay off remeron, though unclear if this caused the joint pain I think neck pain more DDD, willuse low dose of zanaflex at bedtime, continue ultram No NSAIDS with recent GI/GASTRITIS  Increase ensure to BID dosing Check weights at home, will call us in 2 weeks  If weight does not improve will obtain CT chest/abd for cancer work up, pt and wife agreeable

## 2019-03-09 ENCOUNTER — Encounter: Payer: Self-pay | Admitting: Emergency Medicine

## 2019-03-09 ENCOUNTER — Ambulatory Visit (INDEPENDENT_AMBULATORY_CARE_PROVIDER_SITE_OTHER): Payer: PPO | Admitting: Emergency Medicine

## 2019-03-09 ENCOUNTER — Telehealth: Payer: Self-pay | Admitting: Emergency Medicine

## 2019-03-09 ENCOUNTER — Other Ambulatory Visit: Payer: Self-pay

## 2019-03-09 DIAGNOSIS — J438 Other emphysema: Secondary | ICD-10-CM

## 2019-03-09 DIAGNOSIS — J9611 Chronic respiratory failure with hypoxia: Secondary | ICD-10-CM

## 2019-03-09 MED ORDER — ALBUTEROL SULFATE HFA 108 (90 BASE) MCG/ACT IN AERS: 2.0000 | INHALATION_SPRAY | Freq: Four times a day (QID) | RESPIRATORY_TRACT | 11 refills | Status: DC | PRN

## 2019-03-09 MED ORDER — TRELEGY ELLIPTA 100-62.5-25 MCG/INH IN AEPB: 1.0000 | INHALATION_SPRAY | Freq: Every day | RESPIRATORY_TRACT | 0 refills | Status: DC

## 2019-03-09 MED ORDER — PREDNISONE 10 MG PO TABS: 10.0000 mg | ORAL_TABLET | Freq: Every day | ORAL | 5 refills | Status: DC

## 2019-03-09 NOTE — Assessment & Plan Note (Signed)
He has been having more desaturations.  This is happening even on continuous flow oxygen.  Previously he had only had significant desats while on pulsed oxygen (I have encouraged him not to use but he prefers it because is more portable).  Question whether this is progression of his COPD, consider the influence of the sedating medications that he has required.  He is now off Ambien, off Remeron.  He has tizanidine and tramadol.  He is no longer having muscle spasms so I will stop the former.  Overall his cachexia and progressive hypoxemia are ominous signs.  He may be moving towards a more palliative approach.  I will have to discuss with him going forward depending on how he responds to the changes were making today.

## 2019-03-09 NOTE — Progress Notes (Signed)
HPI:  Acute OV 03/09/2019 --John Black is 43 and has a history of severe COPD with associated hypoxemic respiratory failure.  He has used a pulse system although we have documented that he clearly desaturates when he uses this.  He typically uses 3 to 4 L/min.  He had been on prednisone 15 mg daily since February, had to stop due to gastritis.     He uses Symbicort, has never really seem to benefit from LAMA.  He is here today noting that he has been having more trouble for a couple months. He was having his ambien adjusted, then changed to remeron - apparently had significant side effects. Now on tizanidine. He is having desaturations w exertion even on continuous flow 3-3.5L/min. Dr Buelah Manis ordered another trial of Trelegy to replace Symbicort > unsure whether he preferred it. He no longer has combivent to use prn.    No flowsheet data found.   Vitals:   03/09/19 1610  BP: 120/68  Pulse: 100  Temp: 98.2 F (36.8 C)  TempSrc: Oral  SpO2: 92%  Weight: 111 lb 9.6 oz (50.6 kg)  Height: 5\' 6"  (1.676 m)   Gen: Pleasant, cachectic, in no distress on O2,  normal affect  ENT: No lesions,  mouth clear,  oropharynx clear, no postnasal drip  Neck: No JVD, no stridor  Lungs: No use of accessory muscles, B exp wheeze.   Cardiovascular: RRR, heart sounds normal, no murmur or gallops, no peripheral edema  Musculoskeletal: No deformities, no cyanosis or clubbing  Neuro: alert, non focal  Skin: Warm, no lesions or rashes   COPD (chronic obstructive pulmonary disease) with emphysema Progressive hypoxemic respiratory failure.  Certainly at risk for progression of his COPD.  Never really noted much improvement on lama, has benefited in the past from Combivent.  I think at this point it be reasonable to retry Trelegy to maximize bronchodilator regimen.  Also underscored with him the use of either nebulized albuterol or albuterol HFA as rescue therapy.  He does feel that he needs something to help  breathing in the evening hours-  1 of the reasons he wanted to stay on Symbicort.  He felt better on maintenance prednisone but had to stop it due to gastritis.  We talked about the pros and cons of retrying.  I will start him at 10 mg and give him Pepcid twice daily while he is on it.  Reassess progress in a month.  Chronic respiratory failure He has been having more desaturations.  This is happening even on continuous flow oxygen.  Previously he had only had significant desats while on pulsed oxygen (I have encouraged him not to use but he prefers it because is more portable).  Question whether this is progression of his COPD, consider the influence of the sedating medications that he has required.  He is now off Ambien, off Remeron.  He has tizanidine and tramadol.  He is no longer having muscle spasms so I will stop the former.  Overall his cachexia and progressive hypoxemia are ominous signs.  He may be moving towards a more palliative approach.  I will have to discuss with him going forward depending on how he responds to the changes were making today.  John Apo, MD, PhD 03/09/2019, 5:25 PM [facility] [phone] or if no answer [phone]

## 2019-03-09 NOTE — Telephone Encounter (Signed)
I'd like to see him ASAP, thanks

## 2019-03-09 NOTE — Telephone Encounter (Signed)
Called and spoke with pt and wife Jaci Standard who states pt's breathing has become progressively worse over the last two weeks.  Pt has been doing two breathing treatments daily to see if it would help with his breathing. Pt is still using the symbicort daily as prescribed.  Pt does wear 3.5L O2 24/7 at home and uses the Inogen when out. Per pt's wife, when pt is using the Inogen, he is having to use between 4-5 pulse with that.  On the O2, pt's sats have been as low as 75% and then sats will go back up to 92-94% after sitting to rest. Then when pt will take a couple steps, pt's O2 will drop back down to the low 80s.  Pt will go from bedroom to bathroom which is just one door away and just from very little ambulation, pt's O2 sats will drop so quick.  Pt denies any complaints of fever, cough, body aches or chills. Pt has not done any recent travelling and has not been around anyone that has been sick.  Dr. Lamonte Sakai, please advise if you are okay with [place] scheduling an appt for pt to be seen by you or due to recent onset of worsening SOB with low O2 sats over the last two weeks if you believe pt should go to ED to be evaluated. Thanks!

## 2019-03-09 NOTE — Assessment & Plan Note (Signed)
Progressive hypoxemic respiratory failure.  Certainly at risk for progression of his COPD.  Never really noted much improvement on lama, has benefited in the past from Combivent.  I think at this point it be reasonable to retry Trelegy to maximize bronchodilator regimen.  Also underscored with him the use of either nebulized albuterol or albuterol HFA as rescue therapy.  He does feel that he needs something to help breathing in the evening hours-  1 of the reasons he wanted to stay on Symbicort.  He felt better on maintenance prednisone but had to stop it due to gastritis.  We talked about the pros and cons of retrying.  I will start him at 10 mg and give him Pepcid twice daily while he is on it.  Reassess progress in a month.

## 2019-03-09 NOTE — Telephone Encounter (Signed)
Spoke with pt. He has been scheduled to see RB today at 1600. Nothing further was needed.

## 2019-03-09 NOTE — Patient Instructions (Addendum)
Please stop tizanidine Use tramadol sparingly if possible.  Ok to use benadryl 25mg  each evening for sleep We will try starting prednisone 10 mg once a day Restart your pepcid 20 mg twice a day Stop Symbicort Restart Trelegy inhalation once a day. Please rinse and gargle after using.  Use your oxygen at 4L/min.  Follow with Dr Lamonte Sakai in 1 month to assess your status >> 30 minute visit

## 2019-03-13 ENCOUNTER — Telehealth: Payer: Self-pay

## 2019-03-13 NOTE — Telephone Encounter (Signed)
Pt's wife notified. Verbalizes understanding. 

## 2019-03-13 NOTE — Telephone Encounter (Signed)
Stop the prednisone through th weekend and call lung doctor on Monday for other recommendations  It is a side effect of the medicine  Do not take anything else, melatonin okay, he also had the zanaflex muscle relaxer that should make him sleep Stop the [employer]

## 2019-03-13 NOTE — Telephone Encounter (Signed)
Pt's wife called to report that pt had an appt with pulmonologist on 06/22 and was prescribed prednisone. Wife states that pt has been up for 3 days and she has tried giving him benadryl, melatonin, and 1/2 tab [place]. Wife states nothing is working and pt has a lot of energy, his O2 is good, eating well, and losing weight. Pt's wife states she does not know what else to do. Please advise.

## 2019-03-13 NOTE — Telephone Encounter (Signed)
Error

## 2019-03-16 ENCOUNTER — Telehealth: Payer: Self-pay | Admitting: Emergency Medicine

## 2019-03-16 NOTE — Telephone Encounter (Signed)
Spoke with pt.  C/o burning in his stomach area right under the rib cage after taking the prednisone.  Started prednisone on 03/10/19 and took it until 03/13/19.  Has been taking pepcid 20mg  BID also.  Took one more dose on 03/14/19 and burning stomach came back.  Last time he took prednisone he ended up in the ED.  Pt oxygen level was good at 95-96%.  Pt was wondering if there is anything else besides prednisone, Dr. Lamonte Sakai, please advise.

## 2019-03-16 NOTE — Telephone Encounter (Signed)
I agree with stopping the prednisone. Unclear that there is another maintenance medication that we can use to substitute. I can review with him at [facility]. For now I want to make sure his gastritis ccalms down off the pred. Have him call us to let us know.

## 2019-03-16 NOTE — Telephone Encounter (Signed)
Called and spoke with pt letting him know that [place] said to stop the prednisone. Pt verbalized understanding. Stated to pt to call us toward end of week to let us know if the gastritis has calmed down and pt stated he would. Nothing further needed.

## 2019-03-17 ENCOUNTER — Telehealth: Payer: Self-pay | Admitting: Emergency Medicine

## 2019-03-17 NOTE — Telephone Encounter (Signed)
Spoke with patient. He is aware to stop the pepcid.   He wanted to know if there is anything else he can take in place of the prednisone. He stated that while the prednisone caused him to have some stomach discomfort, it did allow him to be able to breathe much better.   RB, please advise. Thanks.

## 2019-03-17 NOTE — Telephone Encounter (Signed)
I think it is OK for him to stop the pepcid

## 2019-03-17 NOTE — Telephone Encounter (Signed)
Spoke with patient. He stated that he has stopped taking prednisone as of this past Saturday. He wants to know since he is no longer taking the prednisone, does he still need to take the Pepcid. Advised him that I would check with RB, he verbalized understanding.   Found this in his assessment from 03/09/19.   Progressive hypoxemic respiratory failure.  Certainly at risk for progression of his COPD.  Never really noted much improvement on lama, has benefited in the past from Combivent.  I think at this point it be reasonable to retry Trelegy to maximize bronchodilator regimen.  Also underscored with him the use of either nebulized albuterol or albuterol HFA as rescue therapy.  He does feel that he needs something to help breathing in the evening hours-  1 of the reasons he wanted to stay on Symbicort.  He felt better on maintenance prednisone but had to stop it due to gastritis.  We talked about the pros and cons of retrying.  I will start him at 10 mg and give him Pepcid twice daily while he is on it.  Reassess progress in a month.  RB, please advise. Thanks!

## 2019-03-18 ENCOUNTER — Telehealth: Payer: Self-pay | Admitting: *Deleted

## 2019-03-18 NOTE — Telephone Encounter (Signed)
Received call from patient.   Reports that he has been having severe ABD pain x2 days. States that he had BM today and pain has lessened. Patient wife states that he had reaction to pizza from Sun night. Reports that pain is still present in R side,but has drastically improved.   Advised if pain returns, go to ER.   Continue Colace 100mg  PO BID. Increase fluids.   Call if any changes.

## 2019-03-18 NOTE — Telephone Encounter (Signed)
Dr. Lamonte Sakai will be in clinic this afternoon 7/1 so hopefully this can be addressed at that time.

## 2019-03-18 NOTE — Telephone Encounter (Signed)
There is no other substitute that won't have the same side effects.

## 2019-03-18 NOTE — Telephone Encounter (Signed)
Called and spoke with pt letting him know the info stated by RB and that there are no other substitute that we could send in that would not have the same side effects. Pt verbalized understanding. Nothing further needed.

## 2019-03-19 ENCOUNTER — Ambulatory Visit: Payer: PPO | Admitting: Family Medicine

## 2019-03-27 DIAGNOSIS — J439 Emphysema, unspecified: Secondary | ICD-10-CM | POA: Diagnosis not present

## 2019-03-30 ENCOUNTER — Encounter: Payer: Self-pay | Admitting: Emergency Medicine

## 2019-03-30 ENCOUNTER — Other Ambulatory Visit: Payer: Self-pay

## 2019-03-30 ENCOUNTER — Ambulatory Visit (INDEPENDENT_AMBULATORY_CARE_PROVIDER_SITE_OTHER): Payer: PPO | Admitting: Emergency Medicine

## 2019-03-30 DIAGNOSIS — J438 Other emphysema: Secondary | ICD-10-CM | POA: Diagnosis not present

## 2019-03-30 MED ORDER — THEOPHYLLINE ER 200 MG PO TB12: 200.0000 mg | ORAL_TABLET | Freq: Two times a day (BID) | ORAL | 5 refills | Status: DC

## 2019-03-30 MED ORDER — PANTOPRAZOLE SODIUM 40 MG PO TBEC: 40.0000 mg | DELAYED_RELEASE_TABLET | Freq: Every day | ORAL | 5 refills | Status: DC

## 2019-03-30 NOTE — Progress Notes (Signed)
HPI:  Acute OV 03/09/2019 --Mr. Griffis is 27 and has a history of severe COPD with associated hypoxemic respiratory failure.  He has used a pulse system although we have documented that he clearly desaturates when he uses this.  He typically uses 3 to 4 L/min.  He had been on prednisone 15 mg daily since February, had to stop due to gastritis.     He uses Symbicort, has never really seem to benefit from LAMA.  He is here today noting that he has been having more trouble for a couple months. He was having his ambien adjusted, then changed to remeron - apparently had significant side effects. Now on tizanidine. He is having desaturations w exertion even on continuous flow 3-3.5L/min. Dr Buelah Manis ordered another trial of Trelegy to replace Symbicort > unsure whether he preferred it. He no longer has combivent to use prn.   ROV 03/30/2019 --this is a follow-up visit for Mr. Heppler has a history of severe obstructive lung disease and associated hypoxemic respiratory failure.  At his last visit I continued a trial of Trelegy that have been planned by Dr. Buelah Manis to see if you get more benefit.  Most importantly we restarted prednisone from which has benefited but when she has difficulty tolerating.  Unfortunately again he had symptoms consistent with gastritis even on Pepcid.  It had to be stopped.    No flowsheet data found.   Vitals:   03/30/19 1140  BP: 106/66  Pulse: 95  SpO2: 95%  Weight: 111 lb (50.3 kg)  Height: 5\' 6"  (1.676 m)   Gen: Pleasant, cachectic, in no distress on O2,  normal affect  ENT: No lesions,  mouth clear,  oropharynx clear, no postnasal drip  Neck: No JVD, no stridor  Lungs: No use of accessory muscles, B exp wheeze.   Cardiovascular: RRR, heart sounds normal, no murmur or gallops, no peripheral edema  Musculoskeletal: No deformities, no cyanosis or clubbing  Neuro: alert, non focal  Skin: Warm, no lesions or rashes   COPD (chronic obstructive pulmonary disease)  with emphysema He benefits from the prednisone but he cannot tolerate it due to GI symptoms.  Unclear to me whether we will be able to get around this but I am willing to try a PPI as opposed to Pepcid to see if we can get him back on low-dose prednisone.  He may also benefit from a GI consultation, we can discuss going forward.  In the meantime I will try him on theophylline to see if he gets any clinical benefit.  Discussed the side effects with him and his wife, daughter today.  Please start Protonix 40 mg once daily.  Take this medication 1 hour around food. Start theophylline 200 mg once a day for 3 days.  If you are tolerating this you can increase to 200 mg twice a day.  Call our office if you develop any side effects including palpitations, chest discomfort, breathing difficulty, tremor. Once you have been on Protonix for a few weeks, we will try restarting low-dose prednisone to see if you can tolerate. Continue Trelegy the way you have been taking it Continue albuterol as needed Continue your oxygen as you have been using it Follow with Dr Lamonte Sakai, tele-visit or APP in 2 to 3 weeks  Baltazar Apo, MD, PhD 03/30/2019, 12:01 PM [facility] [phone] or if no answer [phone]

## 2019-03-30 NOTE — Assessment & Plan Note (Signed)
He benefits from the prednisone but he cannot tolerate it due to GI symptoms.  Unclear to me whether we will be able to get around this but I am willing to try a PPI as opposed to Pepcid to see if we can get him back on low-dose prednisone.  He may also benefit from a GI consultation, we can discuss going forward.  In the meantime I will try him on theophylline to see if he gets any clinical benefit.  Discussed the side effects with him and his wife, daughter today.  Please start Protonix 40 mg once daily.  Take this medication 1 hour around food. Start theophylline 200 mg once a day for 3 days.  If you are tolerating this you can increase to 200 mg twice a day.  Call our office if you develop any side effects including palpitations, chest discomfort, breathing difficulty, tremor. Once you have been on Protonix for a few weeks, we will try restarting low-dose prednisone to see if you can tolerate. Continue Trelegy the way you have been taking it Continue albuterol as needed Continue your oxygen as you have been using it Follow with Dr Lamonte Sakai, tele-visit or APP in 2 to 3 weeks

## 2019-03-30 NOTE — Patient Instructions (Addendum)
Please start Protonix 40 mg once daily.  Take this medication 1 hour around food. Start theophylline 200 mg once a day for 3 days.  If you are tolerating this you can increase to 200 mg twice a day.  Call our office if you develop any side effects including palpitations, chest discomfort, breathing difficulty, tremor. Once you have been on Protonix for a few weeks, we will try restarting low-dose prednisone to see if you can tolerate. Continue Trelegy the way you have been taking it Continue albuterol as needed Continue your oxygen as you have been using it Follow with Dr Lamonte Sakai, tele-visit or APP in 2 to 3 weeks

## 2019-03-31 ENCOUNTER — Other Ambulatory Visit: Payer: Self-pay | Admitting: Emergency Medicine

## 2019-04-09 ENCOUNTER — Ambulatory Visit: Payer: PPO | Admitting: Emergency Medicine

## 2019-04-09 ENCOUNTER — Other Ambulatory Visit: Payer: Self-pay | Admitting: Family Medicine

## 2019-04-13 ENCOUNTER — Telehealth: Payer: Self-pay | Admitting: Emergency Medicine

## 2019-04-13 MED ORDER — TRELEGY ELLIPTA 100-62.5-25 MCG/INH IN AEPB: 1.0000 | INHALATION_SPRAY | Freq: Every day | RESPIRATORY_TRACT | 3 refills | Status: DC

## 2019-04-13 NOTE — Telephone Encounter (Signed)
It's ok with me for him to try taking the theophylline 400mg  qd. He will have to pay attention to how it helps his breathing (? More, less) and to whether he gets side effects on a higher dose.

## 2019-04-13 NOTE — Telephone Encounter (Signed)
Called and spoke with pt who stated he was given a sample of Trelegy at last OV and states that it has been working well for him and now wants Rx to be called into pharmacy. I have taken care of sending that Rx in for pt.    While speaking with pt, pt stated at last OV, he was prescribed protonix as well as theophylline. Pt started on protonix the following day after OV 7/13 but was unable to start on the theophylline due to the pharmacy not having med in stock. While pt was at pharmacy speaking with the pharmacy staff in regards to the theophylline, they told pt that they did not have the 200mg  as it no longer came in that mg but they told pt that they could order 400mg  which pt told them to do so.  Pt was able to start on the theophylline 7/21 and had been breaking the 400mg  in half so he would only be taking 200mg  as directed by RB. Pt said he has been doing well on both the theophylline and also on the protonix.   Per pt and per looking at pt's last OV, it said in there after pt had tolerated the theophylline for at least 3 days that he could begin taking 200mg  twice daily instead of once daily.  Since pt did have 400mg  theophylline ordered at pharmacy, pt wants to know if he should take 1/2 tab of the 400mg  twice daily or if it would be fine for him to take a whole tablet once daily due to it being a 400mg  tablet. Pt also wants to know what RB thought in regards to him starting back on the prednisone.  Dr. Lamonte Sakai, please advise on all this for pt. Thanks!

## 2019-04-13 NOTE — Telephone Encounter (Signed)
Called and spoke with pt stating to him that RB was fine with him taking a full 400mg  tablet of theophylline daily. Stated to him to pay attention how it makes him feel and see if he develops any side effects being on the higher dose and pt verbalized understanding. Nothing further needed.

## 2019-04-21 ENCOUNTER — Encounter: Payer: Self-pay | Admitting: Emergency Medicine

## 2019-04-21 ENCOUNTER — Ambulatory Visit (INDEPENDENT_AMBULATORY_CARE_PROVIDER_SITE_OTHER): Payer: PPO | Admitting: Emergency Medicine

## 2019-04-21 ENCOUNTER — Other Ambulatory Visit: Payer: Self-pay

## 2019-04-21 DIAGNOSIS — J438 Other emphysema: Secondary | ICD-10-CM

## 2019-04-21 DIAGNOSIS — J9611 Chronic respiratory failure with hypoxia: Secondary | ICD-10-CM | POA: Diagnosis not present

## 2019-04-21 NOTE — Progress Notes (Signed)
Virtual Visit via Telephone Note  I connected with John Black on 04/21/19 at 11:30 AM EDT by telephone and verified that I am speaking with the correct person using two identifiers.  Location: Patient: Home Provider: Office   I discussed the limitations, risks, security and privacy concerns of performing an evaluation and management service by telephone and the availability of in person appointments. I also discussed with the patient that there may be a patient responsible charge related to this service. The patient expressed understanding and agreed to proceed.   History of Present Illness: 85 year old man, severe COPD with associated hypoxemic respiratory failure.  He has had progressive dyspnea that is been difficult to manage over the last several months.  I continued to Trelegy, started theophylline at his last visit.  I also tried starting him on Protonix to see if we could get him to a point where he would be able to tolerate prednisone.  Has benefited from prednisone but has difficulty tolerating due to gastritis.  He is using albuterol approximately   Observations/Objective: He feels that he is tolerating the theophylline, doesn't really notice any breathing benefit. He is on the protonix, no side effects. Breathing, cough stable. No CP. Tells me that his family has been tested for COVID, all negative.   Assessment and Plan: COPD (chronic obstructive pulmonary disease) with emphysema We will plan to continue Trelegy, theophylline, BD's as ordered.  Try to add back Prednisone 10mg  now that he is on PPI Continue O2 as ordered  Chronic respiratory failure O2 as ordered. 4L/min.    Follow Up Instructions: ROV or telephone visit in 2-3 weeks    I discussed the assessment and treatment plan with the patient. The patient was provided an opportunity to ask questions and all were answered. The patient agreed with the plan and demonstrated an understanding of the instructions.   The patient was advised to call back or seek an in-person evaluation if the symptoms worsen or if the condition fails to improve as anticipated.  I provided 15 minutes of non-face-to-face time during this encounter.   Collene Gobble, MD

## 2019-04-21 NOTE — Assessment & Plan Note (Signed)
O2 as ordered. 4L/min.

## 2019-04-21 NOTE — Assessment & Plan Note (Signed)
We will plan to continue Trelegy, theophylline, BD's as ordered.  Try to add back Prednisone 10mg  now that he is on PPI Continue O2 as ordered

## 2019-04-23 ENCOUNTER — Other Ambulatory Visit: Payer: Self-pay

## 2019-04-24 ENCOUNTER — Ambulatory Visit: Payer: PPO | Admitting: Family Medicine

## 2019-04-24 ENCOUNTER — Other Ambulatory Visit: Payer: Self-pay | Admitting: Emergency Medicine

## 2019-04-27 DIAGNOSIS — J439 Emphysema, unspecified: Secondary | ICD-10-CM | POA: Diagnosis not present

## 2019-05-12 ENCOUNTER — Encounter: Payer: Self-pay | Admitting: Emergency Medicine

## 2019-05-12 ENCOUNTER — Ambulatory Visit (INDEPENDENT_AMBULATORY_CARE_PROVIDER_SITE_OTHER): Payer: PPO | Admitting: Emergency Medicine

## 2019-05-12 ENCOUNTER — Other Ambulatory Visit: Payer: Self-pay

## 2019-05-12 DIAGNOSIS — J9611 Chronic respiratory failure with hypoxia: Secondary | ICD-10-CM

## 2019-05-12 DIAGNOSIS — J438 Other emphysema: Secondary | ICD-10-CM | POA: Diagnosis not present

## 2019-05-12 NOTE — Assessment & Plan Note (Signed)
Continue same O2

## 2019-05-12 NOTE — Assessment & Plan Note (Signed)
He is tolerating the Pred 10mg  , has benefited. Will continue (along w the protonix). Continue the Trelegy, Theophylline. Albuterol as needed. Flu shot soon - he is planning to get.

## 2019-05-12 NOTE — Progress Notes (Signed)
Virtual Visit via Telephone Note  I connected with John Black on 05/12/19 at  2:15 PM EDT by telephone and verified that I am speaking with the correct person using two identifiers.  Location: Patient: Home Provider: Office   I discussed the limitations, risks, security and privacy concerns of performing an evaluation and management service by telephone and the availability of in person appointments. I also discussed with the patient that there may be a patient responsible charge related to this service. The patient expressed understanding and agreed to proceed.   History of Present Illness: 85 year old man with chronic hypoxemic respiratory failure in setting severe COPD.  He has been on Trelegy and on theophylline since mid July.  He had had difficulty tolerating prednisone due to stomach irritation, gastritis.  I had to stop it and then treat him with pantoprazole.  Then at our last visit 04/21/2019 we tried re-introducing the prednisone to see if he would tolerate and benefit.   Observations/Objective: He reports that he has been able to tolerate the prednisone, no gastritis. He notes that his breathing has benefited. He follows his SpO2, has improved, staying above 90%. Less dyspnea. Still has some cough, better also. On loratadine. Using albuterol about 1-2x a day.   Assessment and Plan: COPD (chronic obstructive pulmonary disease) with emphysema He is tolerating the Pred 10mg  , has benefited. Will continue (along w the protonix). Continue the Trelegy, Theophylline. Albuterol as needed. Flu shot soon - he is planning to get.    Chronic respiratory failure Continue same O2     Follow Up Instructions: 3 months   I discussed the assessment and treatment plan with the patient. The patient was provided an opportunity to ask questions and all were answered. The patient agreed with the plan and demonstrated an understanding of the instructions.   The patient was advised to call  back or seek an in-person evaluation if the symptoms worsen or if the condition fails to improve as anticipated.  I provided 15 minutes of non-face-to-face time during this encounter.   Collene Gobble, MD

## 2019-05-13 ENCOUNTER — Ambulatory Visit (INDEPENDENT_AMBULATORY_CARE_PROVIDER_SITE_OTHER): Payer: PPO | Admitting: Family Medicine

## 2019-05-13 ENCOUNTER — Other Ambulatory Visit: Payer: Self-pay

## 2019-05-13 ENCOUNTER — Encounter: Payer: Self-pay | Admitting: Family Medicine

## 2019-05-13 VITALS — BP 124/68 | HR 60 | Temp 98.0°F | Resp 18 | Ht 66.0 in | Wt 113.8 lb

## 2019-05-13 DIAGNOSIS — N400 Enlarged prostate without lower urinary tract symptoms: Secondary | ICD-10-CM

## 2019-05-13 DIAGNOSIS — Z23 Encounter for immunization: Secondary | ICD-10-CM

## 2019-05-13 DIAGNOSIS — E785 Hyperlipidemia, unspecified: Secondary | ICD-10-CM | POA: Diagnosis not present

## 2019-05-13 DIAGNOSIS — J438 Other emphysema: Secondary | ICD-10-CM | POA: Diagnosis not present

## 2019-05-13 DIAGNOSIS — E440 Moderate protein-calorie malnutrition: Secondary | ICD-10-CM | POA: Diagnosis not present

## 2019-05-13 DIAGNOSIS — J9611 Chronic respiratory failure with hypoxia: Secondary | ICD-10-CM | POA: Diagnosis not present

## 2019-05-13 NOTE — Progress Notes (Signed)
   Subjective:    Patient ID: John Black, male    DOB: [birth year], 85 y.o.   [name]: [id number]  Patient presents for Follow-up (is fasting)   COPD/Emphysema on oxygen therapy, he recently restarted Trelegy for past 2 months has seen improvement. He is also on theophyliine and now on prednisone 10mg  since 8/4 and tolerating with the protonix as well.  He has not had any GI effects   Oxygen levels have improved up to  97%, he is usually on  3-4Liters   weight back up to 113.8lbs appetite improved, he feels well   Due for Flu shot    Hyperlipidemia- due for repeat fasting labs    No new concerns , meds reviewed  Review Of Systems:  GEN- denies fatigue, fever, weight loss,weakness, recent illness HEENT- denies eye drainage, change in vision, nasal discharge, CVS- denies chest pain, palpitations RESP- + SOB with exertion , cough, wheeze ABD- denies N/V, change in stools, abd pain GU- denies dysuria, hematuria, dribbling, incontinence MSK- denies joint pain, muscle aches, injury Neuro- denies headache, dizziness, syncope, seizure activity       Objective:    BP 124/68   Pulse 60   Temp 98 F (36.7 C) (Oral)   Resp 18   Ht 5\' 6"  (1.676 m)   Wt 113 lb 12.8 oz (51.6 kg)   SpO2 94% Comment: 3L/min via [place]  BMI 18.37 kg/m  GEN- NAD, alert and oriented x3 HEENT- PERRL, EOMI, non injected sclera, pink conjunctiva, MMM, oropharynx clear CVS- RRR, no murmur RESP-CTAB  4L  ABD-NABS,soft,NT,ND EXT- No edema Pulses- Radial 2+        Assessment & Plan:      Problem List Items Addressed This Visit      Unprioritized   BPH (benign prostatic hyperplasia)    Doing well with flomax no changes       Chronic respiratory failure (HCC)    Oxygen dependent 3-4L      COPD (chronic obstructive pulmonary disease) with emphysema ([place]) - Primary    Reviewed pulmonary note, he is tolerating his meds at this time  will have to monitor for GI symptoms on the prednisone He has  maxed his therapy for COPD      Mild hyperlipidemia   Relevant Orders   Comprehensive metabolic panel (Completed)   Lipid panel (Completed)   Protein calorie malnutrition ([place])    Fells well, weight is up and down a few pounds He is eating more protein but chronically ill with severe COPD      Relevant Orders   Comprehensive metabolic panel (Completed)   CBC with Differential/Platelet (Completed)    Other Visit Diagnoses    Need for immunization against influenza       Relevant Orders   Flu Vaccine QUAD High Dose(Fluad) (Completed)      Note: This dictation was prepared with Dragon dictation along with smaller phrase technology. Any transcriptional errors that result from this process are unintentional.

## 2019-05-13 NOTE — Patient Instructions (Addendum)
F/U 6 months for Physical  Flu shot given  

## 2019-05-14 ENCOUNTER — Encounter: Payer: Self-pay | Admitting: *Deleted

## 2019-05-14 ENCOUNTER — Encounter: Payer: Self-pay | Admitting: Family Medicine

## 2019-05-14 LAB — CBC WITH DIFFERENTIAL/PLATELET
Absolute Monocytes: 851 cells/uL (ref 200–950)
Basophils Absolute: 30 cells/uL (ref 0–200)
Basophils Relative: 0.4 %
Eosinophils Absolute: 200 cells/uL (ref 15–500)
Eosinophils Relative: 2.7 %
HCT: 41.1 % (ref 38.5–50.0)
Hemoglobin: 13.3 g/dL (ref 13.2–17.1)
Lymphs Abs: 2168 cells/uL (ref 850–3900)
MCH: 28.4 pg (ref 27.0–33.0)
MCHC: 32.4 g/dL (ref 32.0–36.0)
MCV: 87.8 fL (ref 80.0–100.0)
MPV: 9.8 fL (ref 7.5–12.5)
Monocytes Relative: 11.5 %
Neutro Abs: 4151 cells/uL (ref 1500–7800)
Neutrophils Relative %: 56.1 %
Platelets: 330 10*3/uL (ref 140–400)
RBC: 4.68 10*6/uL (ref 4.20–5.80)
RDW: 14.7 % (ref 11.0–15.0)
Total Lymphocyte: 29.3 %
WBC: 7.4 10*3/uL (ref 3.8–10.8)

## 2019-05-14 LAB — COMPREHENSIVE METABOLIC PANEL
AG Ratio: 2 (calc) (ref 1.0–2.5)
ALT: 15 U/L (ref 9–46)
AST: 20 U/L (ref 10–35)
Albumin: 4.3 g/dL (ref 3.6–5.1)
Alkaline phosphatase (APISO): 52 U/L (ref 35–144)
BUN: 19 mg/dL (ref 7–25)
CO2: 31 mmol/L (ref 20–32)
Calcium: 10 mg/dL (ref 8.6–10.3)
Chloride: 96 mmol/L — ABNORMAL LOW (ref 98–110)
Creat: 0.88 mg/dL (ref 0.70–1.11)
Globulin: 2.2 g/dL (calc) (ref 1.9–3.7)
Glucose, Bld: 84 mg/dL (ref 65–99)
Potassium: 4.5 mmol/L (ref 3.5–5.3)
Sodium: 138 mmol/L (ref 135–146)
Total Bilirubin: 0.5 mg/dL (ref 0.2–1.2)
Total Protein: 6.5 g/dL (ref 6.1–8.1)

## 2019-05-14 LAB — LIPID PANEL
Cholesterol: 256 mg/dL — ABNORMAL HIGH (ref <200)
HDL: 106 mg/dL (ref >=40)
LDL Cholesterol (Calc): 131 mg/dL (calc) — ABNORMAL HIGH
Non-HDL Cholesterol (Calc): 150 mg/dL (calc) — ABNORMAL HIGH (ref <130)
Total CHOL/HDL Ratio: 2.4 (calc) (ref <5.0)
Triglycerides: 93 mg/dL (ref <150)

## 2019-05-14 NOTE — Assessment & Plan Note (Signed)
Fells well, weight is up and down a few pounds He is eating more protein but chronically ill with severe COPD

## 2019-05-14 NOTE — Assessment & Plan Note (Signed)
Oxygen dependent 3-4L

## 2019-05-14 NOTE — Assessment & Plan Note (Signed)
Reviewed pulmonary note, he is tolerating his meds at this time  will have to monitor for GI symptoms on the prednisone He has maxed his therapy for COPD

## 2019-05-14 NOTE — Assessment & Plan Note (Signed)
Doing well with flomax no changes

## 2019-05-28 DIAGNOSIS — J439 Emphysema, unspecified: Secondary | ICD-10-CM | POA: Diagnosis not present

## 2019-06-02 ENCOUNTER — Telehealth: Payer: Self-pay | Admitting: *Deleted

## 2019-06-02 ENCOUNTER — Other Ambulatory Visit: Payer: Self-pay | Admitting: Family Medicine

## 2019-06-02 MED ORDER — TAMSULOSIN HCL 0.4 MG PO CAPS: 0.4000 mg | ORAL_CAPSULE | Freq: Every day | ORAL | 3 refills | Status: DC

## 2019-06-02 NOTE — Telephone Encounter (Signed)
Ok to refill??  Last office visit 05/13/2019.  Last refill 11/26/2018.

## 2019-06-02 NOTE — Telephone Encounter (Signed)
Received call from patient.   States that he has been seen at urology since moving to [facility]. Reports that urology checks his PSA, which was under 1.0 at last visit, and prescribes Flomax for BPH.   States that he does not want to continue to see specialist, especially in setting of COVID. Requested PCP to take over prescription refills and monitoring PSA.   MD please advise.

## 2019-06-02 NOTE — Telephone Encounter (Signed)
Prescription sent to pharmacy. .   Call placed to patient and patient made aware.  

## 2019-06-02 NOTE — Telephone Encounter (Signed)
Okay to refill flomax  We can check the PSA next visit if needed

## 2019-06-08 DIAGNOSIS — X32XXXD Exposure to sunlight, subsequent encounter: Secondary | ICD-10-CM | POA: Diagnosis not present

## 2019-06-08 DIAGNOSIS — Z85820 Personal history of malignant melanoma of skin: Secondary | ICD-10-CM | POA: Diagnosis not present

## 2019-06-08 DIAGNOSIS — Z08 Encounter for follow-up examination after completed treatment for malignant neoplasm: Secondary | ICD-10-CM | POA: Diagnosis not present

## 2019-06-08 DIAGNOSIS — L570 Actinic keratosis: Secondary | ICD-10-CM | POA: Diagnosis not present

## 2019-06-08 DIAGNOSIS — L821 Other seborrheic keratosis: Secondary | ICD-10-CM | POA: Diagnosis not present

## 2019-06-08 DIAGNOSIS — Z1283 Encounter for screening for malignant neoplasm of skin: Secondary | ICD-10-CM | POA: Diagnosis not present

## 2019-06-08 DIAGNOSIS — D225 Melanocytic nevi of trunk: Secondary | ICD-10-CM | POA: Diagnosis not present

## 2019-06-08 DIAGNOSIS — L820 Inflamed seborrheic keratosis: Secondary | ICD-10-CM | POA: Diagnosis not present

## 2019-06-08 DIAGNOSIS — C44311 Basal cell carcinoma of skin of nose: Secondary | ICD-10-CM | POA: Diagnosis not present

## 2019-06-27 DIAGNOSIS — J439 Emphysema, unspecified: Secondary | ICD-10-CM | POA: Diagnosis not present

## 2019-07-16 ENCOUNTER — Ambulatory Visit (INDEPENDENT_AMBULATORY_CARE_PROVIDER_SITE_OTHER): Payer: PPO | Admitting: Family Medicine

## 2019-07-16 ENCOUNTER — Other Ambulatory Visit: Payer: Self-pay

## 2019-07-16 ENCOUNTER — Encounter: Payer: Self-pay | Admitting: Family Medicine

## 2019-07-16 VITALS — BP 150/74 | HR 113 | Temp 98.4°F | Resp 24

## 2019-07-16 DIAGNOSIS — J438 Other emphysema: Secondary | ICD-10-CM

## 2019-07-16 DIAGNOSIS — S20212A Contusion of left front wall of thorax, initial encounter: Secondary | ICD-10-CM | POA: Diagnosis not present

## 2019-07-16 DIAGNOSIS — S51012A Laceration without foreign body of left elbow, initial encounter: Secondary | ICD-10-CM

## 2019-07-16 NOTE — Progress Notes (Signed)
Subjective:    Patient ID: John Black, male    DOB: September 09, 1929, 85 y.o.   [name]: [id number]  HPI   2 days ago, the patient fell going into a restaurant.  He landed on his left side.  He suffered an abrasion to his left elbow just behind the lateral epicondyle and above the olecranon.  There is a lens shaped skin tear there that is roughly 4 cm x 2 cm.  There is no active bleeding.  Underlying subcutaneous fat is visible.  Patient has full and painless range of motion in his elbow.  There is no tenderness over the olecranon process.  There is no tenderness over the medial or lateral epicondyle.  He has no effusion in the joint.  He denies any joint pain.  He also suffered for small circular abrasions to the lateral knee.  Each is roughly the size of a dime.  This did not penetrate through the dermis.  There is no bleeding.  These are very shallow.  However he also landed on his left ribs.  He reports pain and tenderness over the angle of the rib just below his left nipple.  He denies any hemoptysis.  He denies any pleurisy.  However he does have some shortness of breath.  The patient is already on 4 L of oxygen for severe COPD.  Today on examination he is moving very little air.  He is audibly wheezing.  There are no crackles or rails.  He denies any purulent sputum.  He denies any fevers or chills. Past Medical History:  Diagnosis Date  . Allergic rhinitis   . Cataract    s/p removal  . Chronic respiratory failure (HCC)    oxygen 3L at home  . Emphysema   . Hypothyroid   . PNA (pneumonia)   . Prostate cancer [facility])    Past Surgical History:  Procedure Laterality Date  . ADENOIDECTOMY    . ROTATOR CUFF REPAIR  1990,2009   bilateral  . seed implant for prostate cancer  2000  . TONSILLECTOMY    . TRANSURETHRAL RESECTION OF PROSTATE  2001   x2   Current Outpatient Medications on File Prior to Visit  Medication Sig Dispense Refill  . albuterol (PROVENTIL) (2.5 MG/3ML) 0.083% nebulizer  solution Take 3 mLs (2.5 mg total) by nebulization every 6 (six) hours as needed for wheezing or shortness of breath. 150 mL 12  . albuterol (VENTOLIN HFA) 108 (90 Base) MCG/ACT inhaler Inhale 2 puffs into the lungs every 6 (six) hours as needed for wheezing or shortness of breath. 18 g 11  . Calcium-Magnesium-Vitamin D (CITRACAL CALCIUM+D PO) Take 1 tablet by mouth every morning.     . Fluticasone-Umeclidin-Vilant (TRELEGY ELLIPTA) 100-62.5-25 MCG/INH AEPB Inhale 1 puff into the lungs daily. 60 each 3  . levothyroxine (SYNTHROID) 75 MCG tablet TAKE 1 TABLET (75 MCG TOTAL) BY MOUTH DAILY. 90 tablet 3  . LORazepam (ATIVAN) 0.5 MG tablet TAKE 1 TABLET (0.5 MG TOTAL) BY MOUTH 2 (TWO) TIMES DAILY AS NEEDED. 30 tablet 2  . pantoprazole (PROTONIX) 40 MG tablet Take 1 tablet (40 mg total) by mouth daily. 30 tablet 5  . predniSONE (DELTASONE) 10 MG tablet Take 10 mg by mouth daily with breakfast.    . tamsulosin (FLOMAX) 0.4 MG CAPS capsule Take 1 capsule (0.4 mg total) by mouth daily. 90 capsule 3  . theophylline (UNIPHYL) 400 MG 24 hr tablet TAKE 1 TABLET BY MOUTH EVERY DAY 30 tablet 5  .  traMADol (ULTRAM) 50 MG tablet Take 1 tablet (50 mg total) by mouth every 8 (eight) hours as needed. for pain 60 tablet 2   No current facility-administered medications on file prior to visit.    Allergies  Allergen Reactions  . Morphine     REACTION: sweats   Social History   Socioeconomic History  . Marital status: Married    Spouse name: Not on file  . Number of children: Not on file  . Years of education: Not on file  . Highest education level: Not on file  Occupational History  . Occupation: Chief Financial Officer  Social Needs  . Financial resource strain: Not on file  . Food insecurity    Worry: Not on file    Inability: Not on file  . Transportation needs    Medical: Not on file    Non-medical: Not on file  Tobacco Use  . Smoking status: Former Smoker    Packs/day: 1.50    Years: 50.00    Pack years:  75.00    Types: Cigarettes    Quit date: 09/17/2008    Years since quitting: 10.8  . Smokeless tobacco: Never Used  Substance and Sexual Activity  . Alcohol use: Yes    Alcohol/week: 1.0 standard drinks    Types: 1 Glasses of wine per week    Comment: each evening  . Drug use: No  . Sexual activity: Never  Lifestyle  . Physical activity    Days per week: Not on file    Minutes per session: Not on file  . Stress: Not on file  Relationships  . Social Herbalist on phone: Not on file    Gets together: Not on file    Attends religious service: Not on file    Active member of club or organization: Not on file    Attends meetings of clubs or organizations: Not on file    Relationship status: Not on file  . Intimate partner violence    Fear of current or ex partner: Not on file    Emotionally abused: Not on file    Physically abused: Not on file    Forced sexual activity: Not on file  Other Topics Concern  . Not on file  Social History Narrative  . Not on file      Review of Systems  All other systems reviewed and are negative.      Objective:   Physical Exam  Constitutional: He appears well-developed and well-nourished. No distress.  Cardiovascular: Normal rate, regular rhythm and normal heart sounds.  No murmur heard. Pulmonary/Chest: No accessory muscle usage. No tachypnea. No respiratory distress. He has decreased breath sounds in the right upper field, the right lower field, the left upper field and the left lower field. He has wheezes in the right upper field, the right lower field, the left upper field and the left lower field. He has no rhonchi. He has no rales. He exhibits tenderness and bony tenderness. He exhibits no mass, no laceration, no crepitus, no edema, no deformity, no swelling and no retraction.  Abdominal:    Musculoskeletal:     Left knee: He exhibits laceration.       Arms:       Legs:  Skin: He is not diaphoretic.  Vitals  reviewed.         Assessment & Plan:  Other emphysema ([place])  Bruised ribs, left, initial encounter  Skin tear of elbow without complication, left, initial  encounter  Patient bruised his ribs but I do not feel he fractured them.  He is having very little pain.  There is no evidence of any significant trauma.  Patient does not require any pain medication at this time.  Skin tear was dressed with Silvadene, nonadherent gauze, and then wrapped with Coban.  Recommended doing this on a daily basis and gave the patient supplies to do the dressings.  The small abrasions over his knee I would cover with a Band-Aid.  However I am concerned about his emphysema.  Patient was given a DuoNeb in the office.  I recommended using albuterol nebulizer treatments every 6 hours until breathing improves.  If worsening we may need to increase his prednisone temporarily although he was recently admitted with gastrointestinal complications related to this.  Patient also would benefit from a rolling walker with a seat that he could use at home to try to help prevent these falls in the future.  Prescription was given for this today

## 2019-07-22 ENCOUNTER — Telehealth: Payer: Self-pay | Admitting: Family Medicine

## 2019-07-22 DIAGNOSIS — S20212A Contusion of left front wall of thorax, initial encounter: Secondary | ICD-10-CM

## 2019-07-22 NOTE — Telephone Encounter (Signed)
Call placed to patient.   Reports that he continues to have rib pain on L side, but it is also causing him to have some SOB as he cannot take a deep breath without pain.   CXR and L Rib XR ordered.

## 2019-07-22 NOTE — Telephone Encounter (Signed)
Patient called he would like to know if we can send him for a chest  xray prior to him coming in on Friday. IF so he would like to go to [facility]. He was seen last week after a fall by Dr. Dennard Schaumann and states he isn't feeling better.  CB# [phone]

## 2019-07-23 ENCOUNTER — Ambulatory Visit (HOSPITAL_COMMUNITY)
Admission: RE | Admit: 2019-07-23 | Discharge: 2019-07-23 | Disposition: A | Discharge status: Home / Self Care | Payer: PPO | Source: Ambulatory Visit | Attending: Family Medicine | Admitting: Family Medicine

## 2019-07-23 ENCOUNTER — Other Ambulatory Visit: Payer: Self-pay

## 2019-07-23 DIAGNOSIS — S299XXA Unspecified injury of thorax, initial encounter: Secondary | ICD-10-CM | POA: Diagnosis not present

## 2019-07-23 DIAGNOSIS — S20212A Contusion of left front wall of thorax, initial encounter: Secondary | ICD-10-CM | POA: Diagnosis not present

## 2019-07-23 DIAGNOSIS — R0781 Pleurodynia: Secondary | ICD-10-CM | POA: Diagnosis not present

## 2019-07-23 IMAGING — DX DG RIBS 2V*L*
2 series · 2 of 2 positions shown · non-contrast
Comparison: [DATE]

CLINICAL DATA: Fall, left rib pain

EXAM:
LEFT RIBS - 2 VIEW; CHEST - 2 VIEW

[rib pa]
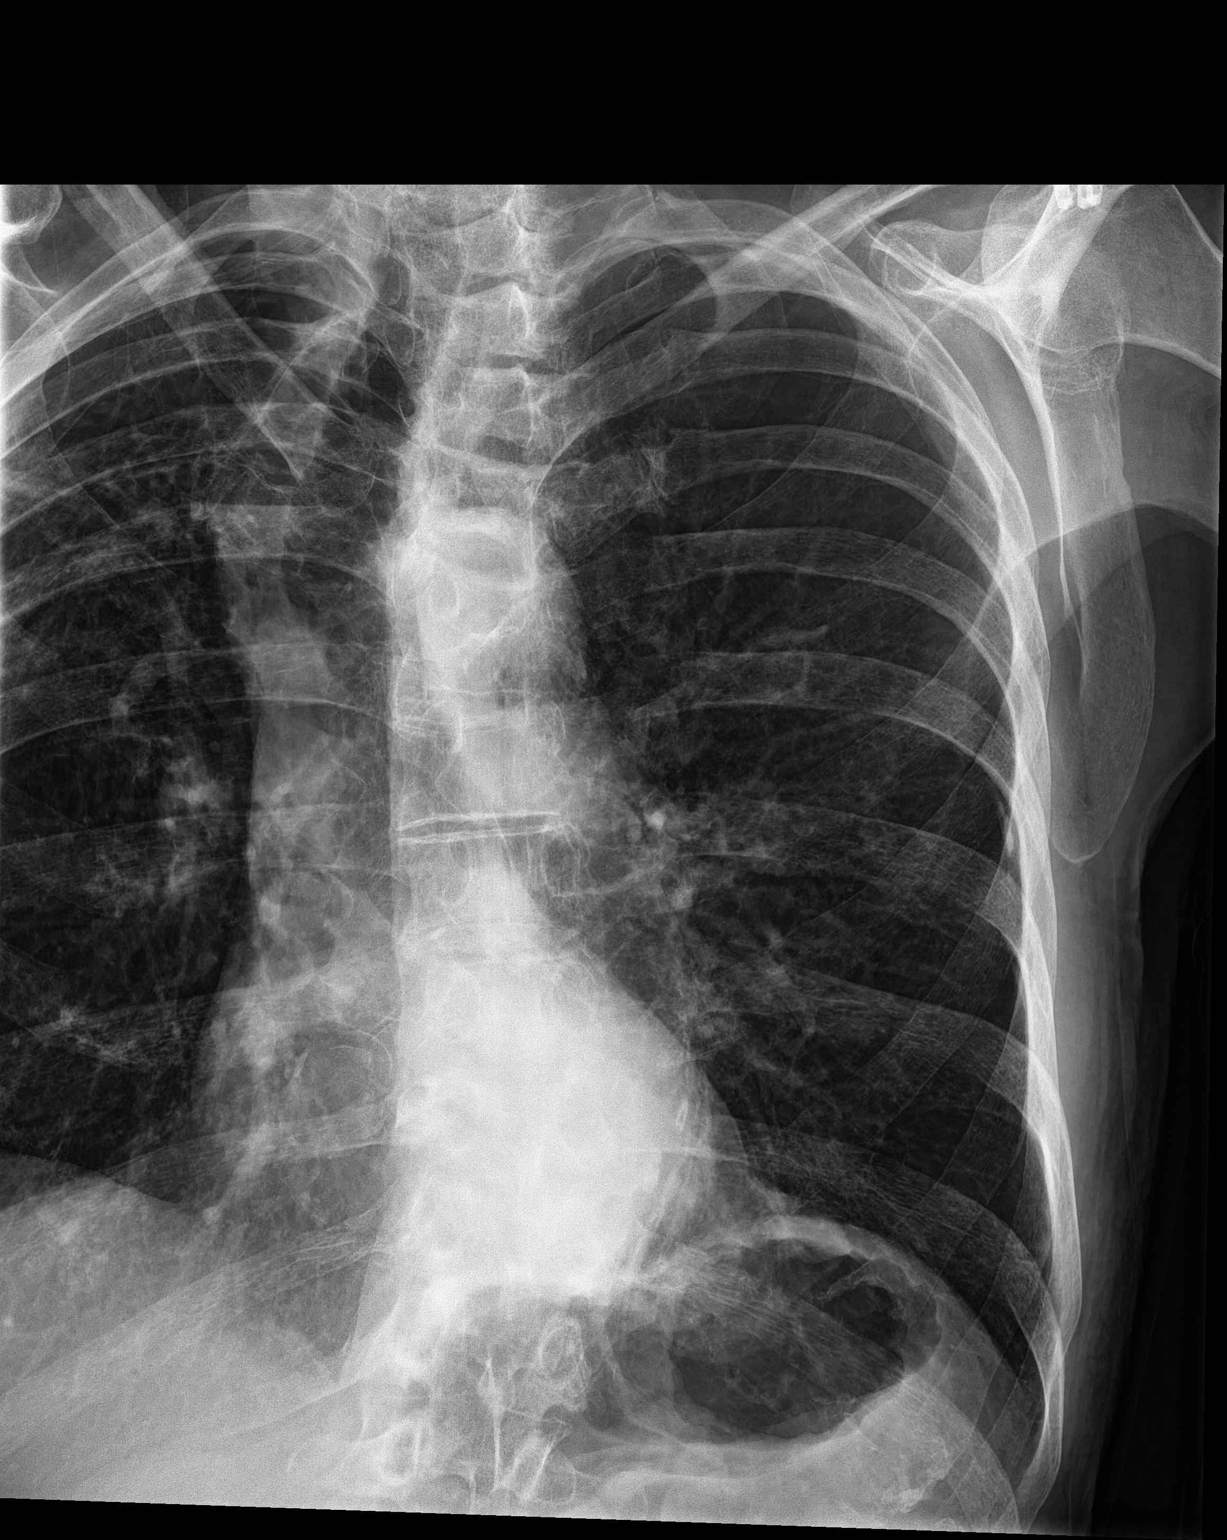

[rib obl]
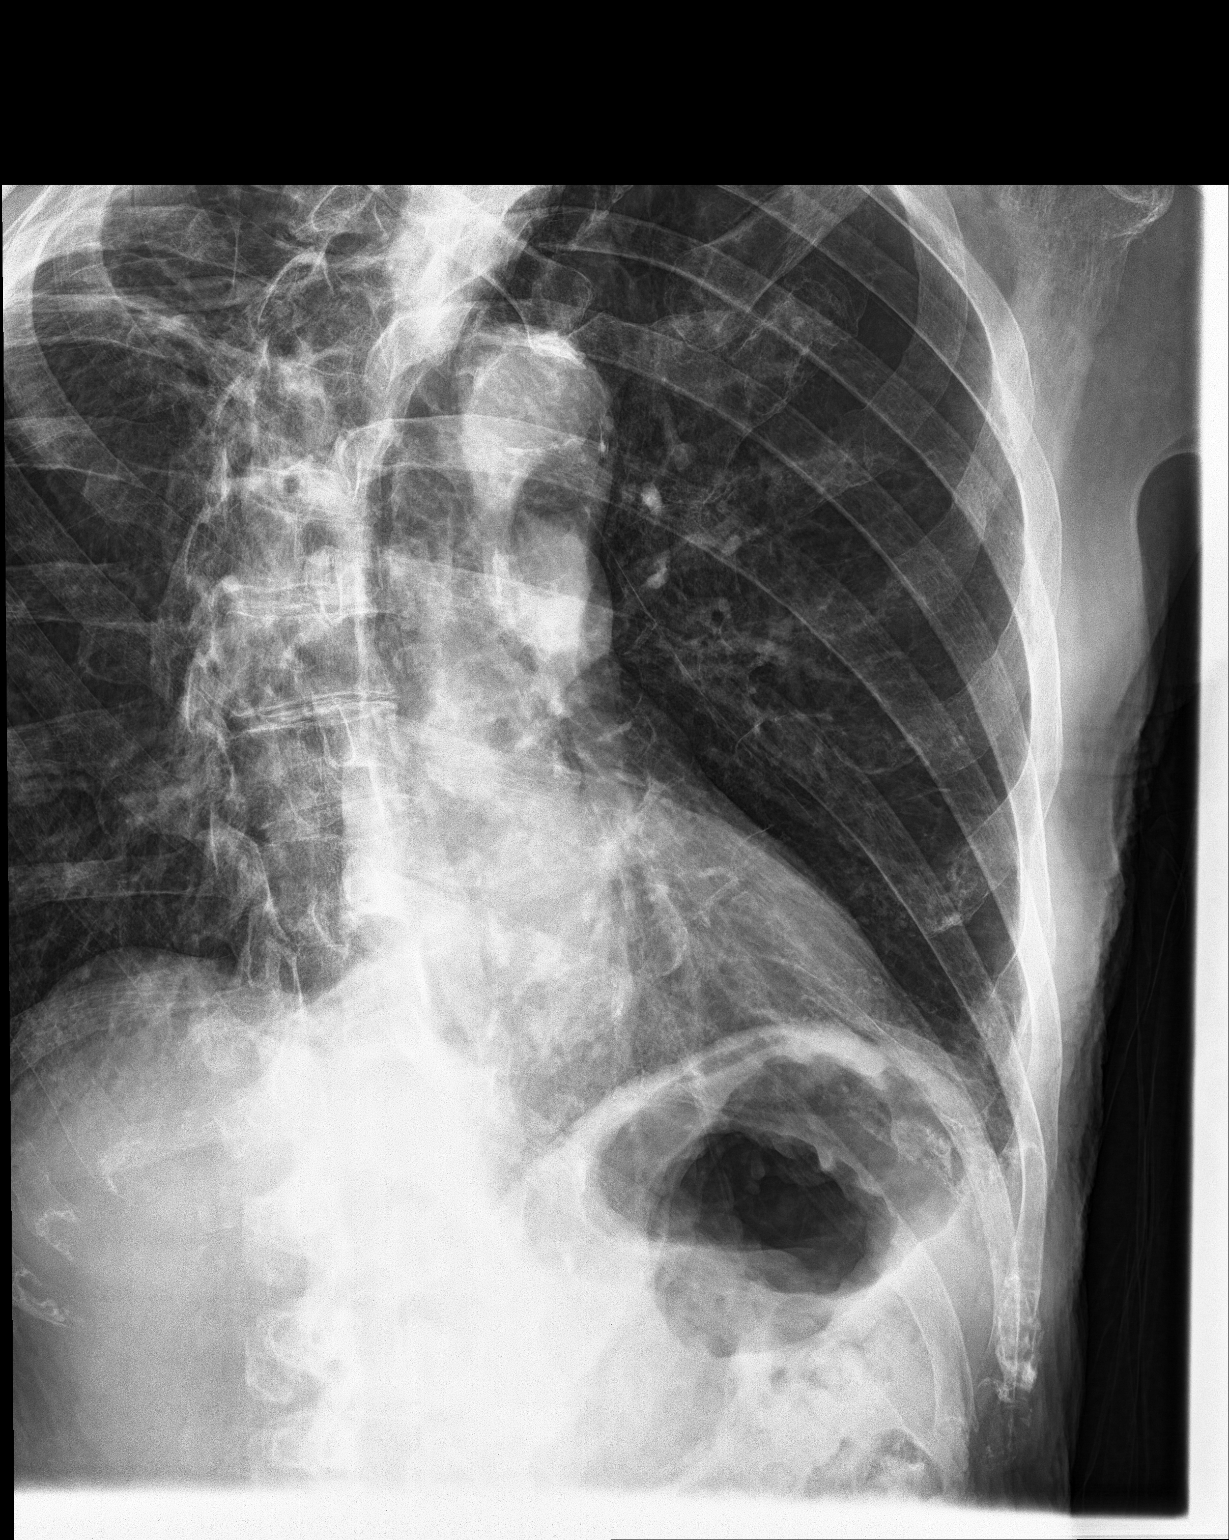

[2 of 2 positions shown; findings below may reference images not displayed]

FINDINGS: No acute left rib fracture identified.

Emphysematous changes with stable scarring in the upper right lung.
No new consolidation or pleural effusion. No pneumothorax. Normal
heart size.
IMPRESSION: No acute left rib fracture identified.  Stable pulmonary findings.

## 2019-07-23 IMAGING — DX DG CHEST 2V
2 series · 2 of 2 positions shown · non-contrast
Comparison: [DATE]

CLINICAL DATA: Fall, left rib pain

EXAM:
LEFT RIBS - 2 VIEW; CHEST - 2 VIEW

[chest pa]
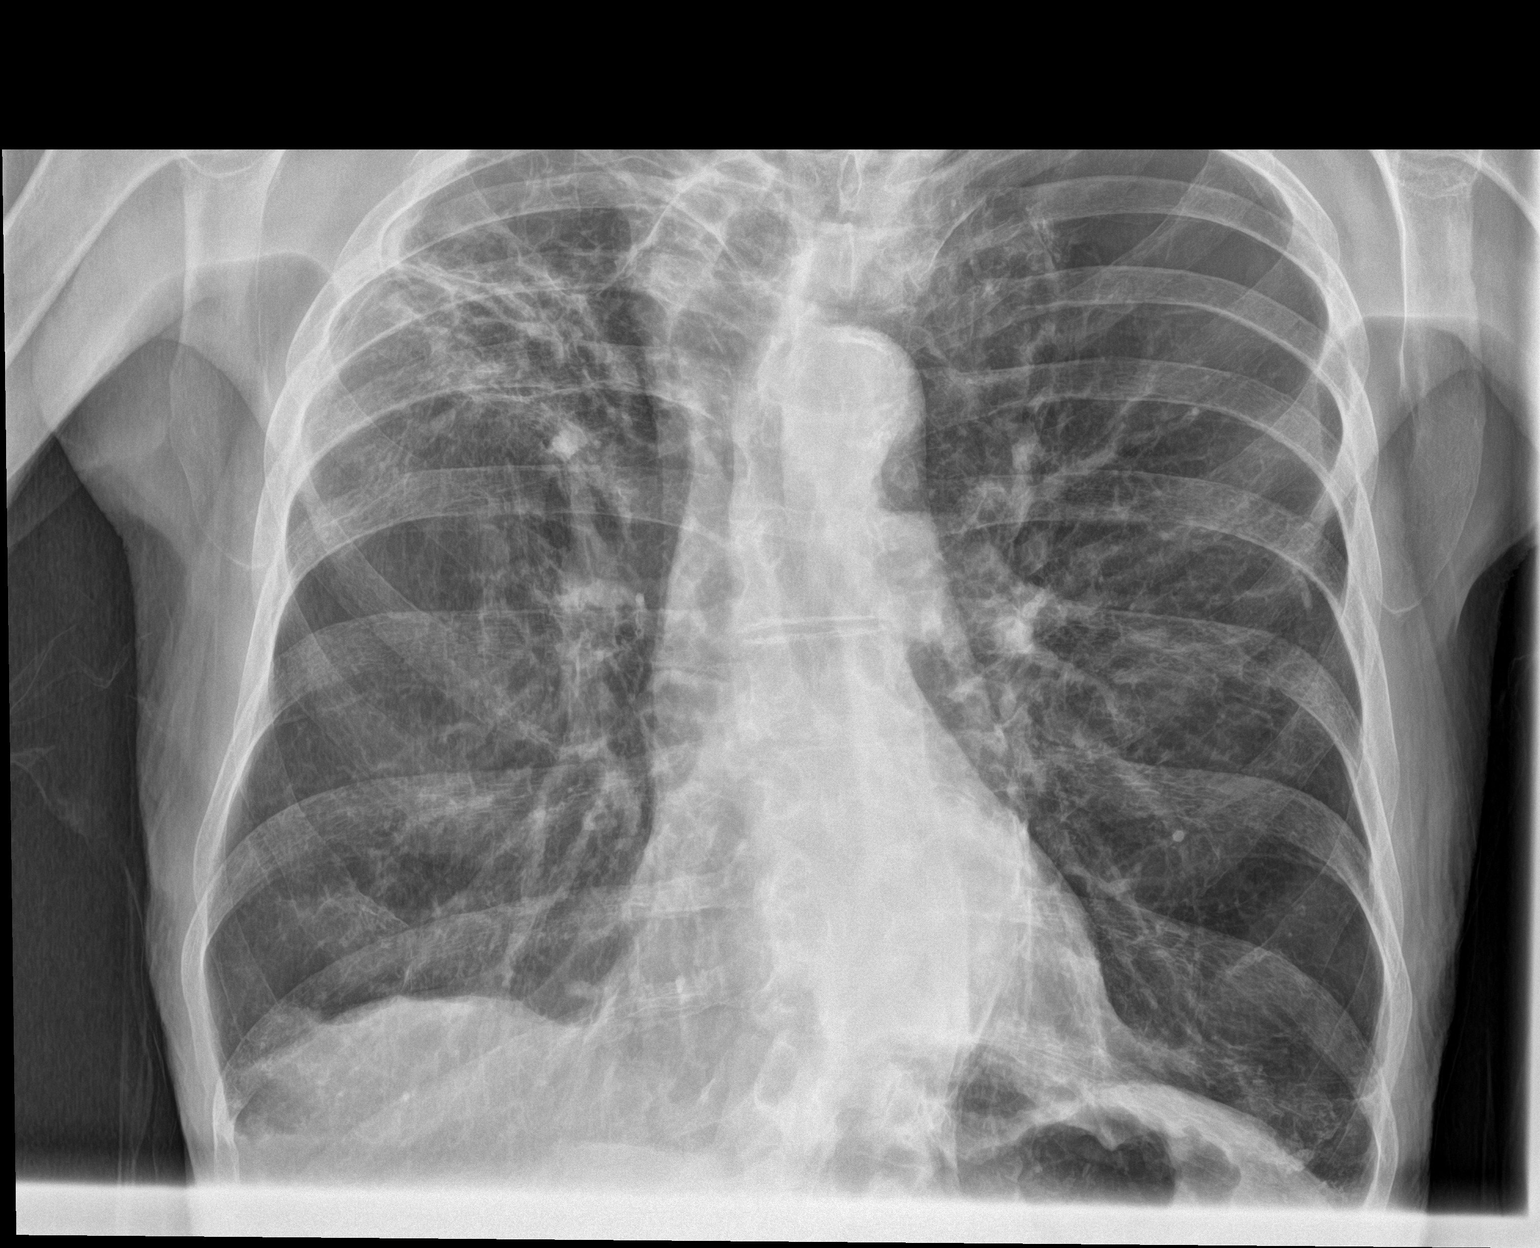

[chest lat]
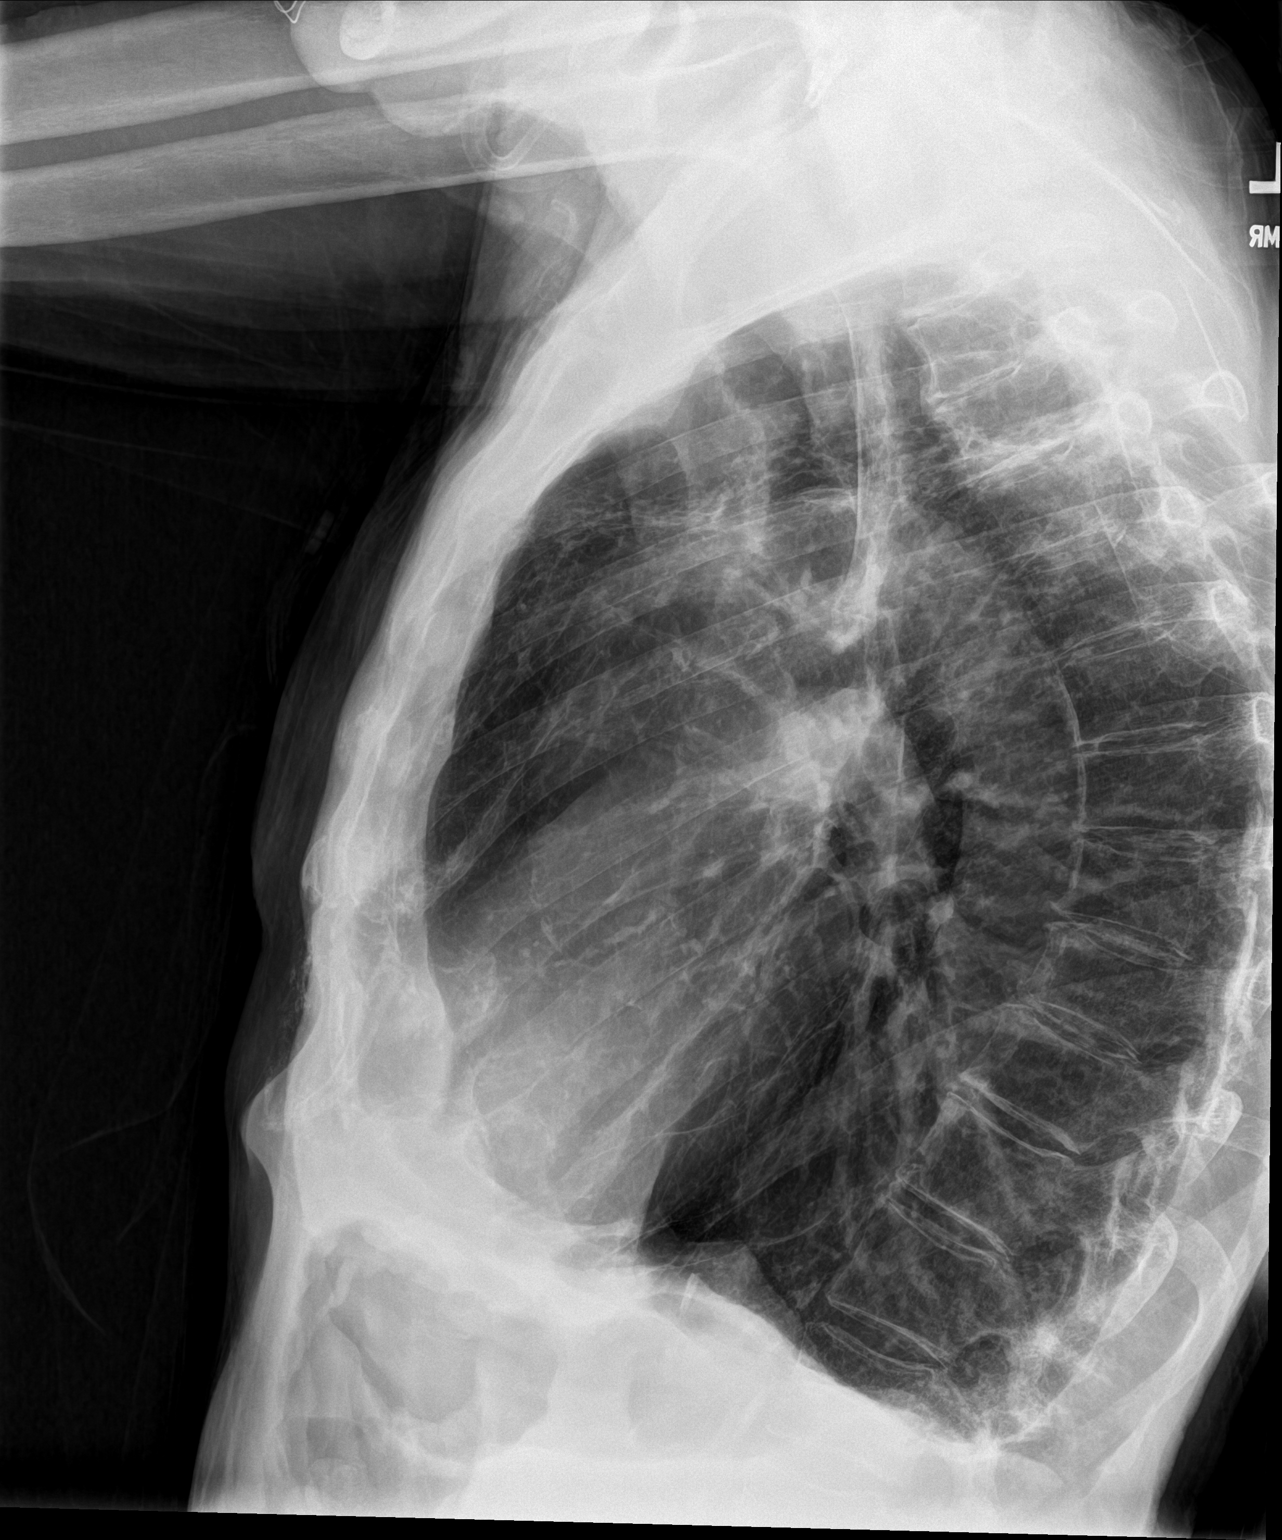

[2 of 2 positions shown; findings below may reference images not displayed]

FINDINGS: No acute left rib fracture identified.

Emphysematous changes with stable scarring in the upper right lung.
No new consolidation or pleural effusion. No pneumothorax. Normal
heart size.
IMPRESSION: No acute left rib fracture identified.  Stable pulmonary findings.

## 2019-07-24 ENCOUNTER — Encounter: Payer: Self-pay | Admitting: Family Medicine

## 2019-07-24 ENCOUNTER — Ambulatory Visit (INDEPENDENT_AMBULATORY_CARE_PROVIDER_SITE_OTHER): Payer: PPO | Admitting: Family Medicine

## 2019-07-24 VITALS — BP 126/70 | HR 112 | Temp 98.1°F | Resp 20

## 2019-07-24 DIAGNOSIS — R2681 Unsteadiness on feet: Secondary | ICD-10-CM | POA: Diagnosis not present

## 2019-07-24 DIAGNOSIS — R1310 Dysphagia, unspecified: Secondary | ICD-10-CM | POA: Insufficient documentation

## 2019-07-24 DIAGNOSIS — S20212D Contusion of left front wall of thorax, subsequent encounter: Secondary | ICD-10-CM

## 2019-07-24 DIAGNOSIS — E440 Moderate protein-calorie malnutrition: Secondary | ICD-10-CM | POA: Diagnosis not present

## 2019-07-24 DIAGNOSIS — S51012D Laceration without foreign body of left elbow, subsequent encounter: Secondary | ICD-10-CM

## 2019-07-24 DIAGNOSIS — J438 Other emphysema: Secondary | ICD-10-CM

## 2019-07-24 MED ORDER — TRAMADOL HCL 50 MG PO TABS: 50.0000 mg | ORAL_TABLET | Freq: Three times a day (TID) | ORAL | 2 refills | Status: DC | PRN

## 2019-07-24 NOTE — Assessment & Plan Note (Signed)
He is pretty much at his baseline.  He does have bruised ribs so he is not taking his deep breaths that they should he is at high risk for atelectasis which can turn into pneumonia.  Advised him if he can find his spirometer at home he should use this a few times a day to help keep the lungs open as much as possible.  Also if he has a cough he can use a pillow to the chest wall.

## 2019-07-24 NOTE — Assessment & Plan Note (Signed)
He is having some dysphagia I think is due to his age.  Advised him to chopped his meats very finely.  Sips in between bites.  He was having trouble with his calcium pill with any changes the Tums and then he will take a separate vitamin D at 1000 international units.  Continue with Ensure. Make these changes first if he has progressive problems then will have a speech swallowing study done

## 2019-07-24 NOTE — Patient Instructions (Addendum)
Take 2 tums and Vitamin D 1000IU separately  Spirometer use this three times a day  Continue all other medications F/U as previous

## 2019-07-24 NOTE — Progress Notes (Signed)
Subjective:    Patient ID: John Black, male    DOB: 07/13/1929, 85 y.o.   [name]: [id number]  Patient presents for Rib Pain (pain in ribs, O2 sats dropping) Patient here to follow-up rib pain/chest wall soreness from a recent fall.  He was seen by my partner on October 29 after a fall.  He had some skin abrasions that were cleaned up at the bedside.  1 on his knee And his left elbow.  He called back in stating that his rib pain was not improving.  Chest x-ray and rib x-ray was obtained which were unremarkable no fracture no pneumonia. He had a fall outside of a restaurant. He is taking ultram for pain  He still has soreness when he is taking a deep breath.  Protein malnutrition he is drinking Ensure daily he is trying to drink more fluids but knows that he is not drink enough water.  His wife brought up they have episodes at dinnertime where he coughs or chokes when he is eating.  Often it is sticky foods like rice and he does not typically drink during dinnertime.  He does not choke on liquids.  He does have some difficulty with meat.   Severe COPD he feels like his breathing is fair.  When he exerts himself he has more difficulty.  He is taking the prednisone on the inhalers by his pulmonologist.  He last had an albuterol treatment yesterday. In the wheelchair as walking with his cane feels taxing to him.  Review Of Systems:  GEN- denies fatigue, fever, weight loss,weakness, recent illness HEENT- denies eye drainage, change in vision, nasal discharge, CVS- denies chest pain, palpitations RESP- denies SOB, cough, wheeze ABD- denies N/V, change in stools, abd pain GU- denies dysuria, hematuria, dribbling, incontinence MSK-+ joint pain, muscle aches, injury Neuro- denies headache, dizziness, syncope, seizure activity       Objective:    BP 126/70   Pulse (!) 112   Temp 98.1 F (36.7 C) (Temporal)   Resp 20   SpO2 92% Comment: 4L/ min via [place] GEN- NAD, alert and oriented  x3 HEENT- PERRL, EOMI, non injected sclera, pink conjunctiva, MMM, oropharynx clear Neck- Supple, no thyromegaly CVS- Tachycardia no murmur Chest wall- TTP left ant lower ribs, no bruising, noted, skin intact  RESP-few scattered wheeze, 4L oxygen comfortable sitting  ABD-NABS,soft,NT,ND Skin- healing laceration on left elbow , no cellulitic changes, thin skin, dry flaky skin all over, scabs on left knee  EXT- No edema Pulses- Radial  2+        Assessment & Plan:      Problem List Items Addressed This Visit      Unprioritized   COPD (chronic obstructive pulmonary disease) with emphysema ([place])    He is pretty much at his baseline.  He does have bruised ribs so he is not taking his deep breaths that they should he is at high risk for atelectasis which can turn into pneumonia.  Advised him if he can find his spirometer at home he should use this a few times a day to help keep the lungs open as much as possible.  Also if he has a cough he can use a pillow to the chest wall.      Dysphagia    He is having some dysphagia I think is due to his age.  Advised him to chopped his meats very finely.  Sips in between bites.  He was having trouble with his calcium  pill with any changes the Tums and then he will take a separate vitamin D at 1000 international units.  Continue with Ensure. Make these changes first if he has progressive problems then will have a speech swallowing study done      Gait instability    Recommend he try the rolling walker so that he can sit especially when his breathing bothers him.  He is still contemplating this.  He does have a regular walker at home as well as a cane.      Protein calorie malnutrition ([place]) - Primary    Other Visit Diagnoses    Bruised ribs, left, subsequent encounter       Skin tear of left elbow without complication, subsequent encounter       continue topical antibiotic, can cover at bedtime as friction on sheets tends to open wound up again,  no celluitis changes      Note: This dictation was prepared with Dragon dictation along with smaller phrase technology. Any transcriptional errors that result from this process are unintentional.

## 2019-07-24 NOTE — Assessment & Plan Note (Signed)
Recommend he try the rolling walker so that he can sit especially when his breathing bothers him.  He is still contemplating this.  He does have a regular walker at home as well as a cane.

## 2019-07-27 DIAGNOSIS — Z08 Encounter for follow-up examination after completed treatment for malignant neoplasm: Secondary | ICD-10-CM | POA: Diagnosis not present

## 2019-07-27 DIAGNOSIS — X32XXXD Exposure to sunlight, subsequent encounter: Secondary | ICD-10-CM | POA: Diagnosis not present

## 2019-07-27 DIAGNOSIS — L570 Actinic keratosis: Secondary | ICD-10-CM | POA: Diagnosis not present

## 2019-07-27 DIAGNOSIS — Z85828 Personal history of other malignant neoplasm of skin: Secondary | ICD-10-CM | POA: Diagnosis not present

## 2019-07-28 DIAGNOSIS — J439 Emphysema, unspecified: Secondary | ICD-10-CM | POA: Diagnosis not present

## 2019-08-07 ENCOUNTER — Encounter: Payer: Self-pay | Admitting: Adult Health

## 2019-08-07 ENCOUNTER — Ambulatory Visit (INDEPENDENT_AMBULATORY_CARE_PROVIDER_SITE_OTHER): Payer: PPO

## 2019-08-07 ENCOUNTER — Other Ambulatory Visit: Payer: Self-pay

## 2019-08-07 ENCOUNTER — Ambulatory Visit: Payer: PPO | Admitting: Adult Health

## 2019-08-07 VITALS — BP 128/70 | HR 68 | Temp 97.8°F | Ht 65.0 in | Wt 116.2 lb

## 2019-08-07 DIAGNOSIS — R0781 Pleurodynia: Secondary | ICD-10-CM

## 2019-08-07 DIAGNOSIS — J438 Other emphysema: Secondary | ICD-10-CM

## 2019-08-07 DIAGNOSIS — R0789 Other chest pain: Secondary | ICD-10-CM

## 2019-08-07 DIAGNOSIS — J9611 Chronic respiratory failure with hypoxia: Secondary | ICD-10-CM | POA: Diagnosis not present

## 2019-08-07 DIAGNOSIS — J449 Chronic obstructive pulmonary disease, unspecified: Secondary | ICD-10-CM | POA: Diagnosis not present

## 2019-08-07 LAB — BASIC METABOLIC PANEL
BUN: 22 mg/dL (ref 6–23)
CO2: 31 mEq/L (ref 19–32)
Calcium: 9.9 mg/dL (ref 8.4–10.5)
Chloride: 95 mEq/L — ABNORMAL LOW (ref 96–112)
Creatinine, Ser: 0.9 mg/dL (ref 0.40–1.50)
GFR: 79.57 mL/min (ref >60.00)
Glucose, Bld: 123 mg/dL — ABNORMAL HIGH (ref 70–99)
Potassium: 4.2 mEq/L (ref 3.5–5.1)
Sodium: 134 mEq/L — ABNORMAL LOW (ref 135–145)

## 2019-08-07 LAB — CBC WITH DIFFERENTIAL/PLATELET
Basophils Absolute: 0 10*3/uL (ref 0.0–0.1)
Basophils Relative: 0.2 % (ref 0.0–3.0)
Eosinophils Absolute: 0 10*3/uL (ref 0.0–0.7)
Eosinophils Relative: 0 % (ref 0.0–5.0)
HCT: 41.6 % (ref 39.0–52.0)
Hemoglobin: 14.1 g/dL (ref 13.0–17.0)
Lymphocytes Relative: 4.7 % — ABNORMAL LOW (ref 12.0–46.0)
Lymphs Abs: 0.4 10*3/uL — ABNORMAL LOW (ref 0.7–4.0)
MCHC: 33.8 g/dL (ref 30.0–36.0)
MCV: 88.4 fl (ref 78.0–100.0)
Monocytes Absolute: 0.4 10*3/uL (ref 0.1–1.0)
Monocytes Relative: 5.3 % (ref 3.0–12.0)
Neutro Abs: 7.2 10*3/uL (ref 1.4–7.7)
Neutrophils Relative %: 89.8 % — ABNORMAL HIGH (ref 43.0–77.0)
Platelets: 351 10*3/uL (ref 150.0–400.0)
RBC: 4.71 Mil/uL (ref 4.22–5.81)
RDW: 14.3 % (ref 11.5–15.5)
WBC: 8 10*3/uL (ref 4.0–10.5)

## 2019-08-07 IMAGING — DX DG CHEST 2V
2 series · 2 of 2 positions shown · non-contrast
Comparison: [DATE].

CLINICAL DATA: Chronic obstructive pulmonary disease.

EXAM:
CHEST - 2 VIEW

[chest pa]
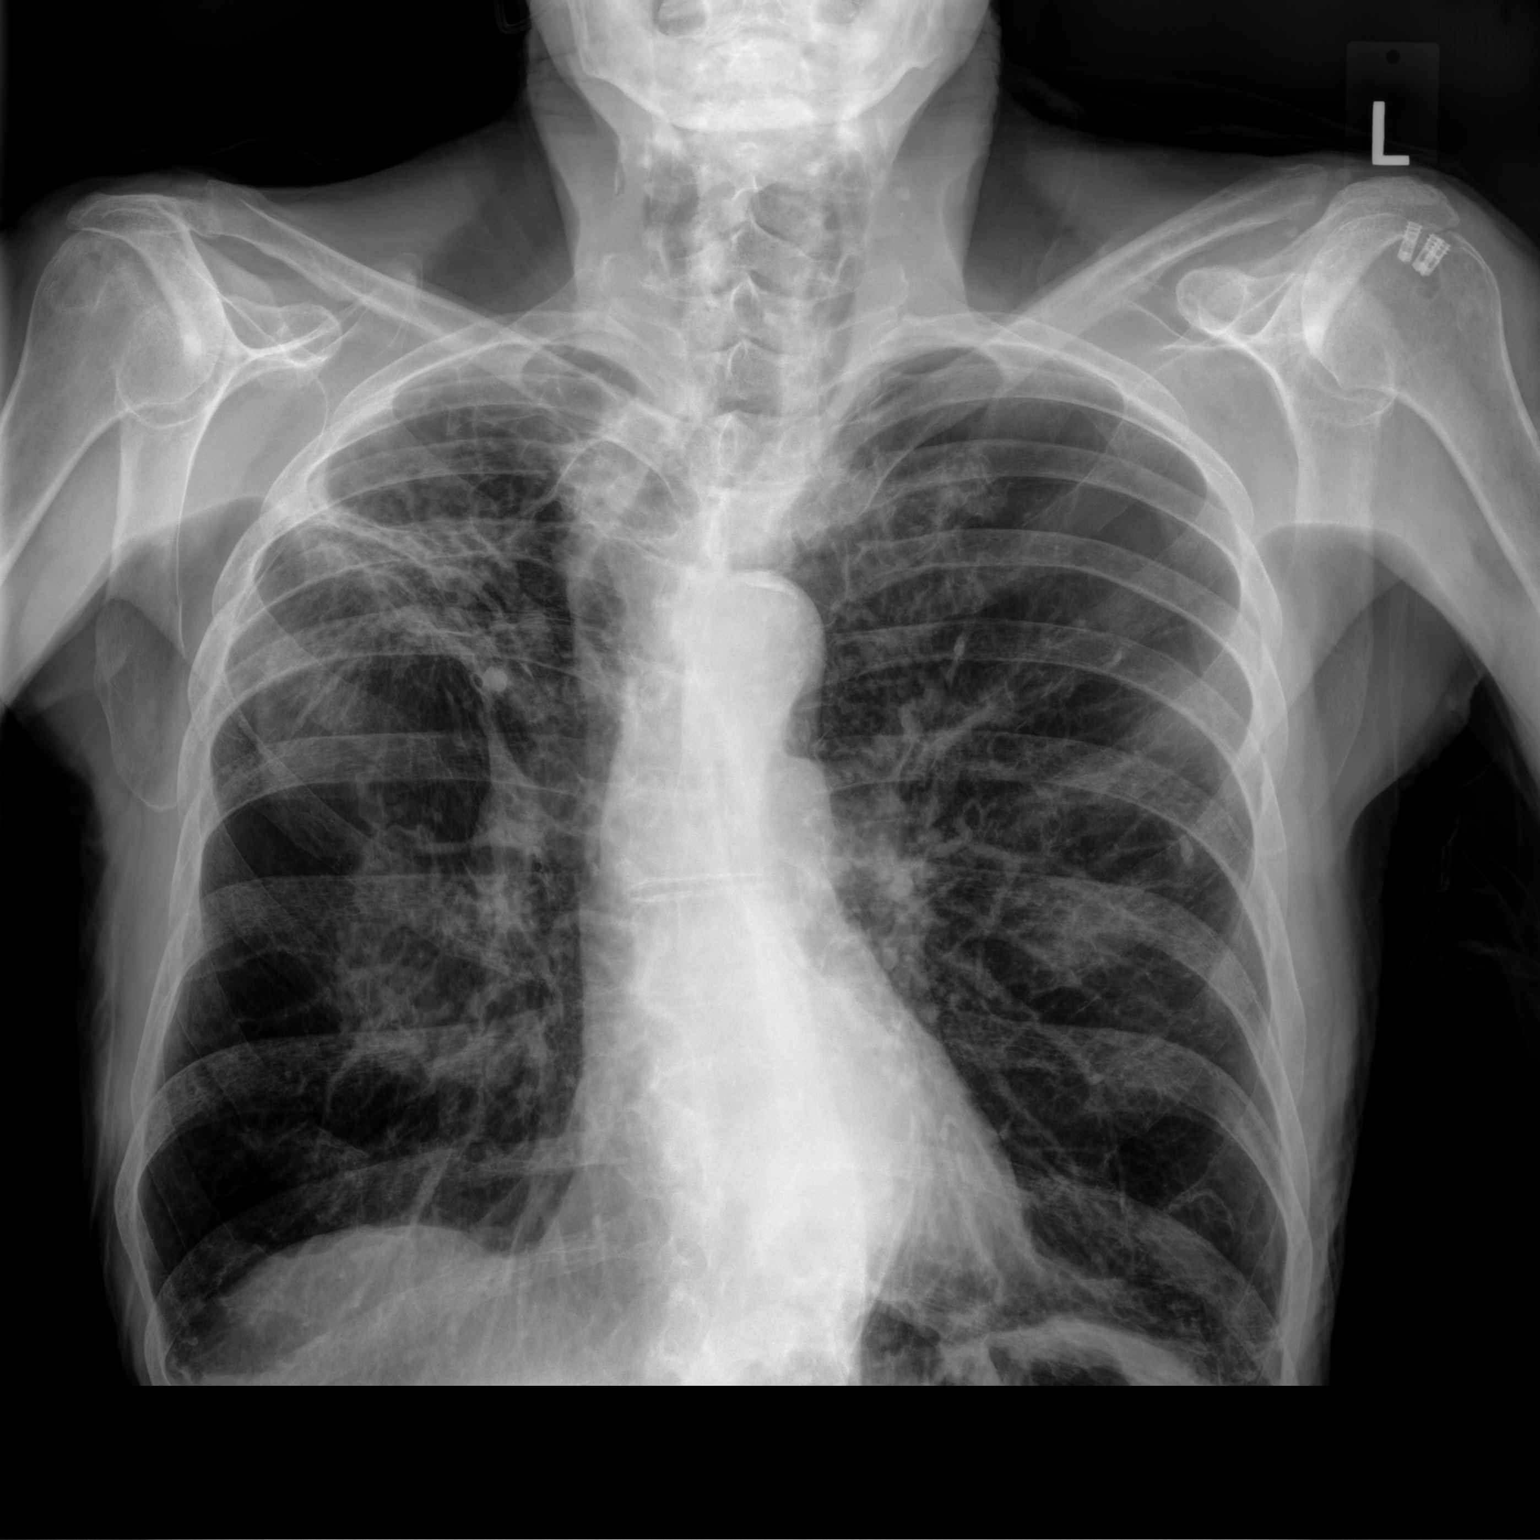

[chest lat]
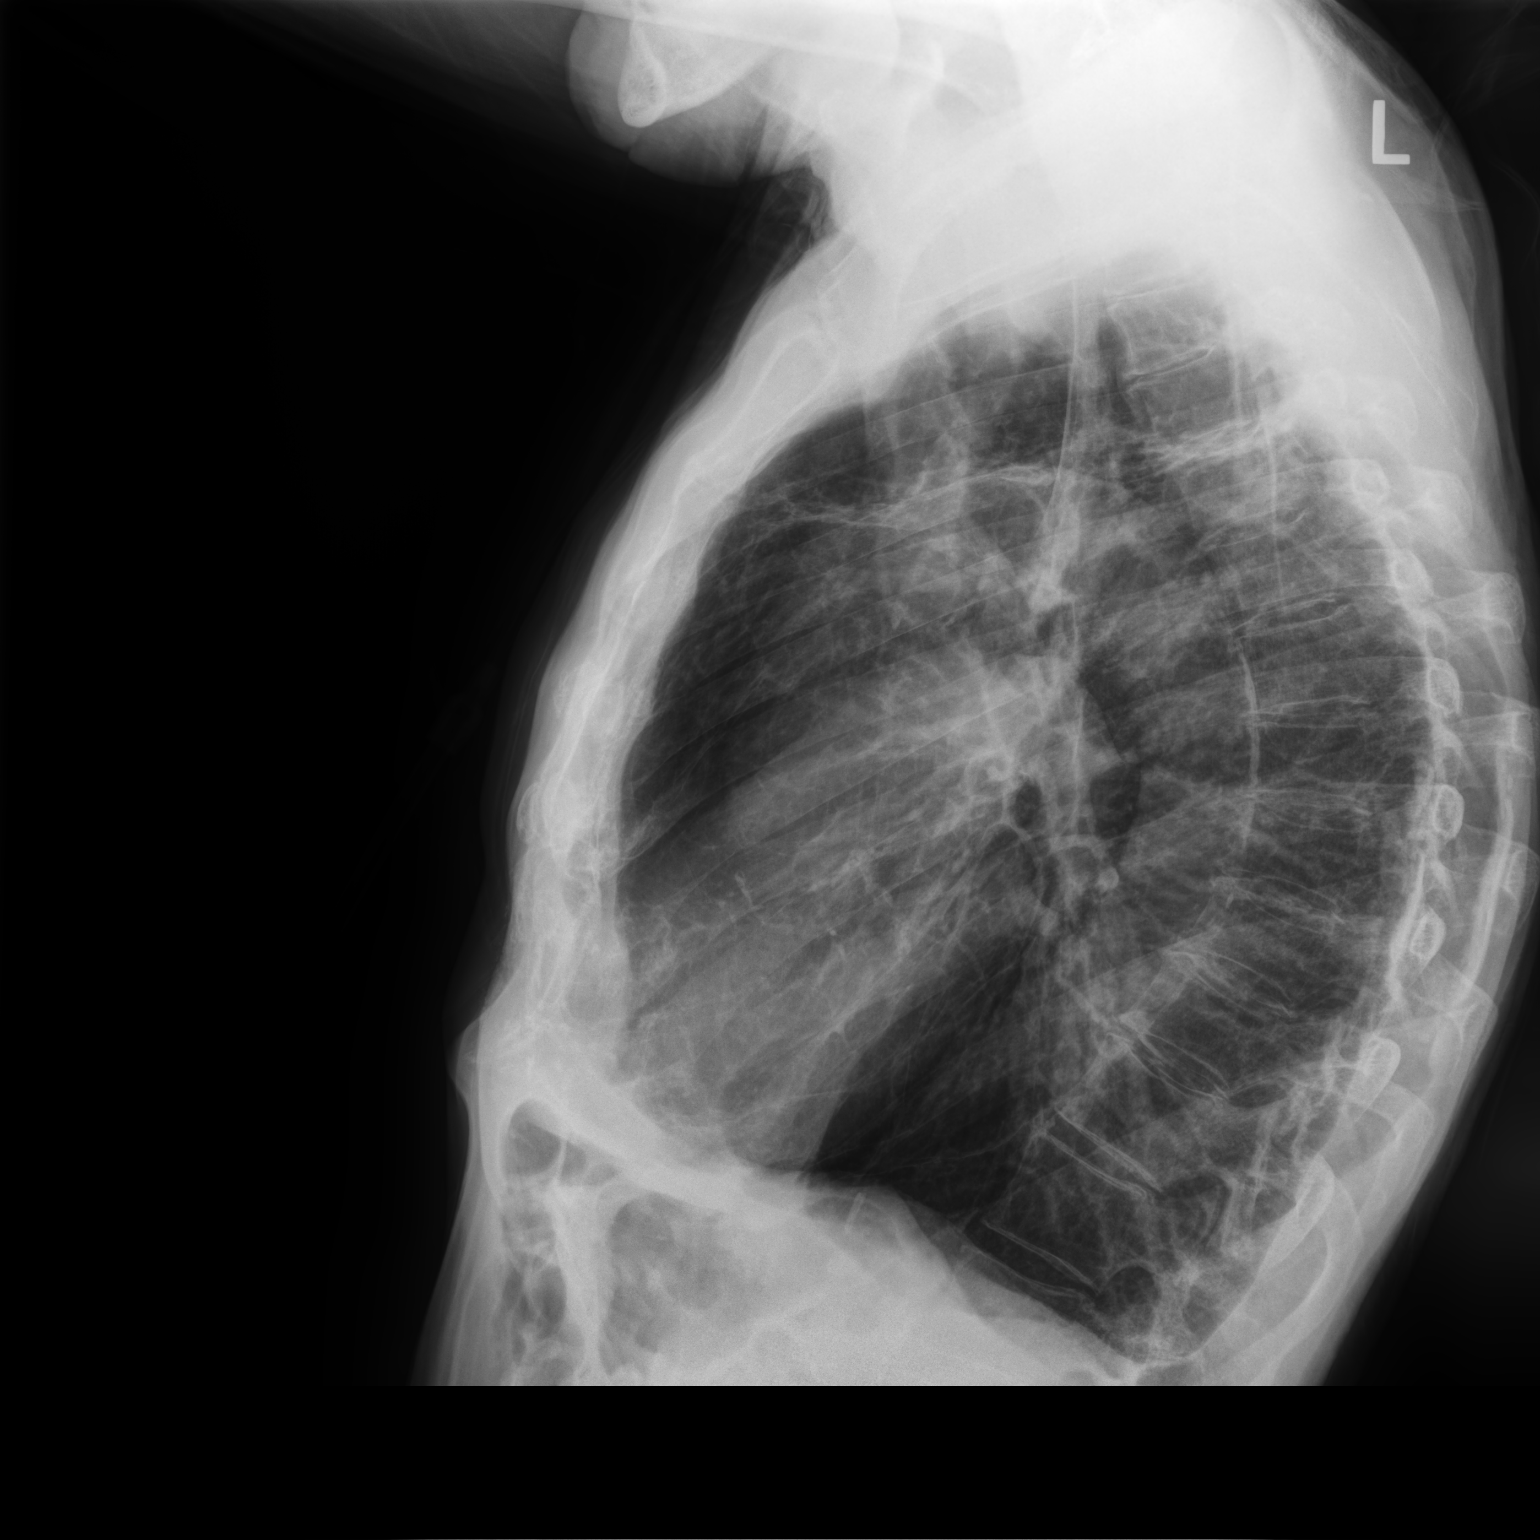

[2 of 2 positions shown; findings below may reference images not displayed]

FINDINGS: The heart size and mediastinal contours are within normal limits. No
pneumothorax or pleural effusion is noted. Atherosclerosis of
thoracic aorta is noted. Hyperexpansion of the lungs is noted. Left
lung is clear. Stable scarring is noted in right upper lobe. New
right infrahilar opacity is noted which may represent inflammation.
The visualized skeletal structures are unremarkable.
IMPRESSION: Hyperexpansion of the lungs is noted suggesting chronic obstructive
pulmonary disease. Stable right upper lobe scarring is noted. New
right infrahilar opacity is noted concerning for possible
inflammation or pneumonia. Followup PA and lateral chest X-ray is
recommended in 3-4 weeks following trial of antibiotic therapy to
ensure resolution and exclude underlying malignancy.

## 2019-08-07 MED ORDER — AZITHROMYCIN 250 MG PO TABS: ORAL_TABLET | ORAL | 0 refills | Status: DC

## 2019-08-07 NOTE — Progress Notes (Unsigned)
Called with cxr results , right sided PNA  ZpacK sent in  Telemedicine visit on Wednesday for follow up (5days)

## 2019-08-07 NOTE — Patient Instructions (Addendum)
Chest xray and labs today .  Continue on TRELEGY daily , rinse after use.  Albuterol neb daily for 3 days and then As needed   Use Flutter valve Three times a day  Continue on Oxygen 4l/m rest and 5l/m with activity .  Pillow for splint for deep breathing /coughing .  Warm compresses to ribs As needed   Follow up with Dr. Lamonte Sakai  In 6 weeks and As needed   Please contact office for sooner follow up if symptoms do not improve or worsen or seek emergency care

## 2019-08-07 NOTE — Progress Notes (Signed)
@Patient  ID: John Black, male    DOB: [birth year], 85 y.o.   [name]: [id number]  Chief Complaint  Patient presents with   Follow-up    COPD     Referring provider: Alycia Rossetti, MD  HPI: 85 year old male former smoker followed for severe COPD and chronic hypoxic respiratory failure on oxygen  TEST/EVENTS :   08/07/2019 Follow up : COPD , O2 RF  Patient returns for a 47-month follow-up.  Patient is accompanied by family member.  Says breathing is not doing as well for last month. Fell 3 weeks ago, rib pain . Rib film /Chest xray with no acute process. Has noticed activity tolerance is decreased and sharper drop in oxygen level with activty. Has rib pain and discomfort.  Patient is on Trelegy daily.  On theophylline and prednisone 10mg  daily .  Feels balance is not as good since fall.  Not on blood thinners or aspirin .  Rib pain is hurting and interupting sleep .  Feels he is slowly getting better but slower than he would like.  Appetite is getting some better but still low.   Anxiety with pandemic, election , etc.   Allergies  Allergen Reactions   Morphine     REACTION: sweats    Immunization History  Administered Date(s) Administered   Fluad Quad(high Dose 65+) 05/13/2019   Influenza Whole 05/30/2011, 06/17/2012   Influenza, High Dose Seasonal PF 05/28/2017, 06/19/2018   Influenza,inj,Quad PF,6+ Mos 05/28/2013, 06/10/2014, 06/17/2015, 06/22/2016   Pneumococcal Conjugate-13 07/20/2014   Pneumococcal Polysaccharide-23 06/19/2012    Past Medical History:  Diagnosis Date   Allergic rhinitis    Cataract    s/p removal   Chronic respiratory failure (HCC)    oxygen 3L at home   Emphysema    Hypothyroid    PNA (pneumonia)    Prostate cancer ([place])     Tobacco History: Social History   Tobacco Use  Smoking Status Former Smoker   Packs/day: 1.50   Years: 50.00   Pack years: 75.00   Types: Cigarettes   Quit date: 09/17/2008   Years  since quitting: 10.8  Smokeless Tobacco Never Used   Counseling given: Not Answered   Outpatient Medications Prior to Visit  Medication Sig Dispense Refill   albuterol (PROVENTIL) (2.5 MG/3ML) 0.083% nebulizer solution Take 3 mLs (2.5 mg total) by nebulization every 6 (six) hours as needed for wheezing or shortness of breath. 150 mL 12   albuterol (VENTOLIN HFA) 108 (90 Base) MCG/ACT inhaler Inhale 2 puffs into the lungs every 6 (six) hours as needed for wheezing or shortness of breath. 18 g 11   Fluticasone-Umeclidin-Vilant (TRELEGY ELLIPTA) 100-62.5-25 MCG/INH AEPB Inhale 1 puff into the lungs daily. 60 each 3   levothyroxine (SYNTHROID) 75 MCG tablet TAKE 1 TABLET (75 MCG TOTAL) BY MOUTH DAILY. 90 tablet 3   LORazepam (ATIVAN) 0.5 MG tablet TAKE 1 TABLET (0.5 MG TOTAL) BY MOUTH 2 (TWO) TIMES DAILY AS NEEDED. 30 tablet 2   pantoprazole (PROTONIX) 40 MG tablet Take 1 tablet (40 mg total) by mouth daily. 30 tablet 5   predniSONE (DELTASONE) 10 MG tablet Take 10 mg by mouth daily with breakfast.     tamsulosin (FLOMAX) 0.4 MG CAPS capsule Take 1 capsule (0.4 mg total) by mouth daily. 90 capsule 3   theophylline (UNIPHYL) 400 MG 24 hr tablet TAKE 1 TABLET BY MOUTH EVERY DAY 30 tablet 5   traMADol (ULTRAM) 50 MG tablet Take 1 tablet (50 mg total)  by mouth every 8 (eight) hours as needed. for pain 60 tablet 2   No facility-administered medications prior to visit.      Review of Systems:   Constitutional:   No  weight loss, night sweats,  Fevers, chills, + fatigue, or  lassitude.  HEENT:   No headaches,  Difficulty swallowing,  Tooth/dental problems, or  Sore throat,                No sneezing, itching, ear ache, nasal congestion, post nasal drip,   CV:  No chest pain,  Orthopnea, PND, swelling in lower extremities, anasarca, dizziness, palpitations, syncope.   GI  No heartburn, indigestion, abdominal pain, nausea, vomiting, diarrhea, change in bowel habits, loss of appetite,  bloody stools.   Resp:    No chest wall deformity  Skin: no rash or lesions.  GU: no dysuria, change in color of urine, no urgency or frequency.  No flank pain, no hematuria   MS: balance issues - chronic      Physical Exam  BP 128/70 (BP Location: Right Arm, Cuff Size: Normal)    Pulse 68    Temp 97.8 F (36.6 C) (Temporal)    Ht 5\' 5"  (1.651 m)    Wt 116 lb 3.2 oz (52.7 kg)    SpO2 95% Comment: RA   BMI 19.34 kg/m   GEN: A/Ox3; pleasant , NAD, elderly , frail , wc    HEENT:  [place]/AT,   NOSE-clear, THROAT-clear, no lesions, no postnasal drip or exudate noted.   NECK:  Supple w/ fair ROM; no JVD; normal carotid impulses w/o bruits; no thyromegaly or nodules palpated; no lymphadenopathy.    RESP  Diminished BS in bases , no wheezing . Rib with no deformities noted. Tender along left lateral ribs   CARD:  RRR, no m/r/g, no peripheral edema, pulses intact, no cyanosis or clubbing. No calf pain   GI:   Soft & nt; nml bowel sounds; no organomegaly or masses detected.   Musco: Warm bil, no deformities or joint swelling noted.   Neuro: alert, no focal deficits noted.    Skin: Warm, no lesions or rashes    Lab Results:  CBC  BMET  Imaging: Dg Chest 2 View  Result Date: 08/07/2019 CLINICAL DATA:  Chronic obstructive pulmonary disease. EXAM: CHEST - 2 VIEW COMPARISON:  July 23, 2019. FINDINGS: The heart size and mediastinal contours are within normal limits. No pneumothorax or pleural effusion is noted. Atherosclerosis of thoracic aorta is noted. Hyperexpansion of the lungs is noted. Left lung is clear. Stable scarring is noted in right upper lobe. New right infrahilar opacity is noted which may represent inflammation. The visualized skeletal structures are unremarkable. IMPRESSION: Hyperexpansion of the lungs is noted suggesting chronic obstructive pulmonary disease. Stable right upper lobe scarring is noted. New right infrahilar opacity is noted concerning for possible  inflammation or pneumonia. Followup PA and lateral chest X-ray is recommended in 3-4 weeks following trial of antibiotic therapy to ensure resolution and exclude underlying malignancy. Electronically Signed   By: Marijo Conception M.D.   On: 08/07/2019 14:53   Dg Chest 2 View  Result Date: 07/23/2019 CLINICAL DATA:  Fall, left rib pain EXAM: LEFT RIBS - 2 VIEW; CHEST - 2 VIEW COMPARISON:  November 12, 2018 FINDINGS: No acute left rib fracture identified. Emphysematous changes with stable scarring in the upper right lung. No new consolidation or pleural effusion. No pneumothorax. Normal heart size. IMPRESSION: No acute left rib fracture  identified.  Stable pulmonary findings. Electronically Signed   By: Macy Mis M.D.   On: 07/23/2019 16:41   Dg Ribs Unilateral Left  Result Date: 07/23/2019 CLINICAL DATA:  Fall, left rib pain EXAM: LEFT RIBS - 2 VIEW; CHEST - 2 VIEW COMPARISON:  November 12, 2018 FINDINGS: No acute left rib fracture identified. Emphysematous changes with stable scarring in the upper right lung. No new consolidation or pleural effusion. No pneumothorax. Normal heart size. IMPRESSION: No acute left rib fracture identified.  Stable pulmonary findings. Electronically Signed   By: Macy Mis M.D.   On: 07/23/2019 16:41      PFT Results Latest Ref Rng & Units 01/07/2014  FVC-Pre L 3.22  FVC-Predicted Pre % 107  FVC-Post L 3.22  FVC-Predicted Post % 107  Pre FEV1/FVC % % 34  Post FEV1/FCV % % 36  FEV1-Pre L 1.10  FEV1-Predicted Pre % 53  FEV1-Post L 1.17  DLCO UNC% % 47  DLCO COR %Predicted % 52  TLC L 7.15  TLC % Predicted % 120  RV % Predicted % 143    No results found for: NITRICOXIDE      Assessment & Plan:   COPD (chronic obstructive pulmonary disease) with emphysema Mild flare suspect from rib pain/splinting and hypoventilation  Would use warm compresses. Add flutter valve  Check cxr and labs w/ theophylline level.   Plan  Patient Instructions  Chest  xray and labs today .  Continue on TRELEGY daily , rinse after use.  Albuterol neb daily for 3 days and then As needed   Add Flutter valve Three times a day  Continue on Oxygen 4l/m rest and 5l/m with activity .  Pillow for splint for deep breathing /coughing .  Warm compresses to ribs As needed   Follow up with Dr. Lamonte Sakai  In 6 weeks and As needed   Please contact office for sooner follow up if symptoms do not improve or worsen or seek emergency care          Chronic respiratory failure Compensated. Some desats initially after fall with activity seems to be improving  Goal for O2 sats >88-90%   Plan  Patient Instructions  Chest xray and labs today .  Continue on TRELEGY daily , rinse after use.  Albuterol neb daily for 3 days and then As needed   Add Flutter valve Three times a day  Continue on Oxygen 4l/m rest and 5l/m with activity .  Pillow for splint for deep breathing /coughing .  Warm compresses to ribs As needed   Follow up with Dr. Lamonte Sakai  In 6 weeks and As needed   Please contact office for sooner follow up if symptoms do not improve or worsen or seek emergency care          Rib pain Left rib contusion from fall 3 weeks ago - no fracture on cxr .  Warm heat and pillow splint Add flutter valve   Plan  Patient Instructions  Chest xray and labs today .  Continue on TRELEGY daily , rinse after use.  Albuterol neb daily for 3 days and then As needed   Add Flutter valve Three times a day  Continue on Oxygen 4l/m rest and 5l/m with activity .  Pillow for splint for deep breathing /coughing .  Warm compresses to ribs As needed   Follow up with Dr. Lamonte Sakai  In 6 weeks and As needed   Please contact office for sooner follow up if  symptoms do not improve or worsen or seek emergency care             Rexene Edison, NP 08/07/2019

## 2019-08-07 NOTE — Assessment & Plan Note (Addendum)
Compensated. Some desats initially after fall with activity seems to be improving  Goal for O2 sats >88-90%   Plan  Patient Instructions  Chest xray and labs today .  Continue on TRELEGY daily , rinse after use.  Albuterol neb daily for 3 days and then As needed   Add Flutter valve Three times a day  Continue on Oxygen 4l/m rest and 5l/m with activity .  Pillow for splint for deep breathing /coughing .  Warm compresses to ribs As needed   Follow up with Dr. Lamonte Sakai  In 6 weeks and As needed   Please contact office for sooner follow up if symptoms do not improve or worsen or seek emergency care

## 2019-08-07 NOTE — Assessment & Plan Note (Signed)
Left rib contusion from fall 3 weeks ago - no fracture on cxr .  Warm heat and pillow splint Add flutter valve   Plan  Patient Instructions  Chest xray and labs today .  Continue on TRELEGY daily , rinse after use.  Albuterol neb daily for 3 days and then As needed   Add Flutter valve Three times a day  Continue on Oxygen 4l/m rest and 5l/m with activity .  Pillow for splint for deep breathing /coughing .  Warm compresses to ribs As needed   Follow up with Dr. Lamonte Sakai  In 6 weeks and As needed   Please contact office for sooner follow up if symptoms do not improve or worsen or seek emergency care

## 2019-08-07 NOTE — Assessment & Plan Note (Signed)
Mild flare suspect from rib pain/splinting and hypoventilation  Would use warm compresses. Add flutter valve  Check cxr and labs w/ theophylline level.   Plan  Patient Instructions  Chest xray and labs today .  Continue on TRELEGY daily , rinse after use.  Albuterol neb daily for 3 days and then As needed   Add Flutter valve Three times a day  Continue on Oxygen 4l/m rest and 5l/m with activity .  Pillow for splint for deep breathing /coughing .  Warm compresses to ribs As needed   Follow up with Dr. Lamonte Sakai  In 6 weeks and As needed   Please contact office for sooner follow up if symptoms do not improve or worsen or seek emergency care

## 2019-08-08 LAB — THEOPHYLLINE LEVEL: Theophylline Lvl: 7.4 mg/L — ABNORMAL LOW (ref 10.0–20.0)

## 2019-08-10 ENCOUNTER — Telehealth: Payer: Self-pay | Admitting: Adult Health

## 2019-08-10 NOTE — Telephone Encounter (Signed)
LMTCB

## 2019-08-11 ENCOUNTER — Telehealth: Payer: Self-pay

## 2019-08-11 NOTE — Telephone Encounter (Signed)
Pt has appt 11.25. We will provide patient with flutter at that time.   Nothing further needed at this time.

## 2019-08-11 NOTE — Telephone Encounter (Signed)
We stopped the AMbien in June due to dizzy spells he felt was associated with use and at his age he is high risk for falls He has ativan has he tried taking ativan 1 hour before bedtime, this can be used for sleep  Also  He does not need in office visit Monday for this , he can be changed to tele health

## 2019-08-11 NOTE — Telephone Encounter (Signed)
LMTCB

## 2019-08-11 NOTE — Progress Notes (Signed)
Called spoke with patient, advised of lab results / recs as stated by Parrett NP.  Pt verbalized understanding and denied any questions. 

## 2019-08-11 NOTE — Telephone Encounter (Signed)
Pt called to report that he is having bad insomnia for the past 3 weeks. Pt states that he is only sleeping 2 hours a day and his energy level has gone way down. Pt would like a very low dose of ambien so he can get some rest and make it through the holiday.    I scheduled pt an appt for Monday 08-17-2019 @ noon.   Please advise.

## 2019-08-11 NOTE — Telephone Encounter (Signed)
Pt notified. Pt states he is disappointed not getting ambien and that he will try the lorazepam. Pt seem agitated. I changed OV to Telephone visit.

## 2019-08-12 ENCOUNTER — Other Ambulatory Visit: Payer: Self-pay

## 2019-08-12 ENCOUNTER — Ambulatory Visit (INDEPENDENT_AMBULATORY_CARE_PROVIDER_SITE_OTHER): Payer: PPO | Admitting: Adult Health

## 2019-08-12 ENCOUNTER — Encounter: Payer: Self-pay | Admitting: Adult Health

## 2019-08-12 DIAGNOSIS — R0781 Pleurodynia: Secondary | ICD-10-CM | POA: Diagnosis not present

## 2019-08-12 DIAGNOSIS — J9611 Chronic respiratory failure with hypoxia: Secondary | ICD-10-CM

## 2019-08-12 DIAGNOSIS — J438 Other emphysema: Secondary | ICD-10-CM | POA: Diagnosis not present

## 2019-08-12 MED ORDER — FLUTTER DEVI: 0 refills | Status: DC

## 2019-08-12 NOTE — Patient Instructions (Signed)
Continue on TRELEGY daily , rinse after use.  Albuterol neb As needed   Use Flutter valve Three times a day -we will place one up front in our office for you to pick up .  Continue on Oxygen 4l/m rest and 5l/m with activity .  Pillow for splint for deep breathing /coughing .  Warm compresses to ribs As needed   May try Melatonin At bedtime  For insomnia .  Follow up with Dr. Lamonte Sakai  In 6 weeks and As needed   Please contact office for sooner follow up if symptoms do not improve or worsen or seek emergency care

## 2019-08-12 NOTE — Addendum Note (Signed)
Addended by: Parke Poisson E on: 08/12/2019 12:43 PM   Modules accepted: Orders

## 2019-08-12 NOTE — Progress Notes (Signed)
Virtual Visit via Telephone Note  I connected with Roanna Raider on 08/12/19 at 10:30 AM EST by telephone and verified that I am speaking with the correct person using two identifiers.  Location: Patient: Home  Provider: Office    I discussed the limitations, risks, security and privacy concerns of performing an evaluation and management service by telephone and the availability of in person appointments. I also discussed with the patient that there may be a patient responsible charge related to this service. The patient expressed understanding and agreed to proceed.   History of Present Illness: 85 year old male former smoker followed for severe COPD and chronic hypoxic respiratory failure on oxygen  Today's televisit is a follow-up for COPD.  Patient was seen 1 week ago for increased shortness of breath.  Patient sustained a fall 4 weeks ago.  He has had ongoing left rib pain.  He had noticed decreased activity tolerance and intermittent drops in his oxygen level.  Patient has severe COPD and is on Trelegy, theophylline and prednisone 10 mg.  Chest x-ray in the office showed right infrahilar opacity suggestive of a pneumonia.  No rib fractures noted.  Previous rib films on July 23, 2019 showed no rib fracture . There was no consolidation on chest x-ray on November 5.  Patient was given a Z-Pak.  Also felt to have rib contusion and was recommended to use warm heat and pillow for splinting.  Flutter valve was added.(could not find his )  Patient remains on oxygen 4 L at rest and 5 L with activity.  Feels that he is some better. Rib pain is less. Breathing is better, not as much shortness of breath . Although baseline is dyspnea with minimal activity .  Finished Zpack yesterday without any issues.  No chest pain , orthopnea , edema or fever.    Observations/Objective: Talks in full sentences, no audible wheezing   Assessment and Plan: COPD flare with probable Right sided PNA -  clinically improving with pulmonary hygiene regimen and Zpack  Check chest xray on return in 6 weeks   Chronic Respiratory failure on Oxygen - goal is to have O2 sats >88-90- stable on present regimen   Rib contusion from fall 4 weeks ago-improving   Plan   Patient Instructions  Continue on TRELEGY daily , rinse after use.  Albuterol neb As needed   Use Flutter valve Three times a day -we will place one up front in our office for you to pick up .  Continue on Oxygen 4l/m rest and 5l/m with activity .  Pillow for splint for deep breathing /coughing .  Warm compresses to ribs As needed   May try Melatonin At bedtime  For insomnia .  Follow up with Dr. Lamonte Sakai  In 6 weeks and As needed   Please contact office for sooner follow up if symptoms do not improve or worsen or seek emergency care          Follow Up Instructions: Follow up in 6 weeks and As needed with chest xray  Please contact office for sooner follow up if symptoms do not improve or worsen or seek emergency care     I discussed the assessment and treatment plan with the patient. The patient was provided an opportunity to ask questions and all were answered. The patient agreed with the plan and demonstrated an understanding of the instructions.   The patient was advised to call back or seek an in-person evaluation if the symptoms worsen  or if the condition fails to improve as anticipated.  I provided 22 minutes of non-face-to-face time during this encounter.   Rexene Edison, NP

## 2019-08-17 ENCOUNTER — Encounter: Payer: Self-pay | Admitting: Family Medicine

## 2019-08-17 ENCOUNTER — Other Ambulatory Visit: Payer: Self-pay

## 2019-08-17 ENCOUNTER — Ambulatory Visit (INDEPENDENT_AMBULATORY_CARE_PROVIDER_SITE_OTHER): Payer: PPO | Admitting: Family Medicine

## 2019-08-17 DIAGNOSIS — F5101 Primary insomnia: Secondary | ICD-10-CM | POA: Diagnosis not present

## 2019-08-17 DIAGNOSIS — K5904 Chronic idiopathic constipation: Secondary | ICD-10-CM | POA: Diagnosis not present

## 2019-08-17 MED ORDER — ZOLPIDEM TARTRATE 5 MG PO TABS: 5.0000 mg | ORAL_TABLET | Freq: Every evening | ORAL | 2 refills | Status: DC | PRN

## 2019-08-17 NOTE — Progress Notes (Signed)
Virtual Visit via Telephone Note  I connected with John Black on 08/17/19 at 12:00 PM EST by telephone and verified that I am speaking with the correct person using two identifiers.      Pt location: at home   Physician location:  In office, [facility], Vic Blackbird MD     On call: patient, patient Wife and physician   I discussed the limitations, risks, security and privacy concerns of performing an evaluation and management service by telephone and the availability of in person appointments. I also discussed with the patient that there may be a patient responsible charge related to this service. The patient expressed understanding and agreed to proceed.   History of Present Illness:  Chronic insomnia- he wants to go back on Ambienl since he cant sleep He stays his gait is better, he is using walker, also has wheelchair.   Before he was taking 1/2 tablet and would fall asleep, he would wake up 3- 4am and take another 1/2 pill of the ambien, until he would get up at 7am and feel a little dizzy He did try remeron, but but this did not help and he felt like it caused joint pain Also tried zanaflex, this doesn't help He did try ativan past few days  He feels constipated, he doesn't empty his bowels all the way, but doesn't have hard stools, he feels irritated in rectal region, no blood in stool   He was treated for early CAP on November 20th with azithromycin, he is still on prednisone 10mg  daily  He has not needed ultram in the past month    Observations/Objective:  NAD noted over the phone   Assessment and Plan: Chronic insomnia- will try ambien 5mg  again for sleep , can also continue melatonin advised him not to take half tablet in the middle of the night he should take the whole thing at one time and we also putting him on the lower dose.  Constipation-advised that he can use MiraLAX a few times a week to see if this helps with his bowels . COPD  management per pulmonary recent antibiotics to cover for community-acquired pneumonia.   No recent falls.  Advised him if he does get dizzy feels off balance especially with additional Ambien he is to discontinue the medication and let us know.  He is worried about  quality of life and comfort,  not being able to sleep makes him very irritated and tired during the day.  Follow Up Instructions:    I discussed the assessment and treatment plan with the patient. The patient was provided an opportunity to ask questions and all were answered. The patient agreed with the plan and demonstrated an understanding of the instructions.   The patient was advised to call back or seek an in-person evaluation if the symptoms worsen or if the condition fails to improve as anticipated.  I provided 19 minutes of non-face-to-face time during this encounter. End Time 12:19pm  Vic Blackbird, MD

## 2019-08-27 DIAGNOSIS — J439 Emphysema, unspecified: Secondary | ICD-10-CM | POA: Diagnosis not present

## 2019-08-30 ENCOUNTER — Other Ambulatory Visit: Payer: Self-pay | Admitting: Emergency Medicine

## 2019-08-31 ENCOUNTER — Telehealth: Payer: Self-pay | Admitting: Emergency Medicine

## 2019-08-31 MED ORDER — TRELEGY ELLIPTA 100-62.5-25 MCG/INH IN AEPB: 1.0000 | INHALATION_SPRAY | Freq: Every day | RESPIRATORY_TRACT | 3 refills | Status: DC

## 2019-08-31 NOTE — Telephone Encounter (Signed)
Spoke with pt and advised rx sent to [facility]. Nothing further is needed.

## 2019-09-22 ENCOUNTER — Other Ambulatory Visit: Payer: Self-pay | Admitting: Emergency Medicine

## 2019-09-22 ENCOUNTER — Other Ambulatory Visit: Payer: Self-pay | Admitting: *Deleted

## 2019-09-22 DIAGNOSIS — Z8701 Personal history of pneumonia (recurrent): Secondary | ICD-10-CM

## 2019-09-23 ENCOUNTER — Ambulatory Visit: Payer: PPO | Admitting: Adult Health

## 2019-09-23 ENCOUNTER — Encounter: Payer: Self-pay | Admitting: Adult Health

## 2019-09-23 ENCOUNTER — Ambulatory Visit (INDEPENDENT_AMBULATORY_CARE_PROVIDER_SITE_OTHER): Payer: PPO

## 2019-09-23 ENCOUNTER — Other Ambulatory Visit: Payer: Self-pay

## 2019-09-23 VITALS — BP 112/70 | HR 108 | Temp 97.2°F | Ht 66.0 in | Wt 119.4 lb

## 2019-09-23 DIAGNOSIS — J9611 Chronic respiratory failure with hypoxia: Secondary | ICD-10-CM | POA: Diagnosis not present

## 2019-09-23 DIAGNOSIS — J438 Other emphysema: Secondary | ICD-10-CM

## 2019-09-23 DIAGNOSIS — J189 Pneumonia, unspecified organism: Secondary | ICD-10-CM | POA: Diagnosis not present

## 2019-09-23 DIAGNOSIS — Z8701 Personal history of pneumonia (recurrent): Secondary | ICD-10-CM | POA: Diagnosis not present

## 2019-09-23 IMAGING — DX DG CHEST 2V
2 series · 2 of 2 positions shown · non-contrast
Comparison: [DATE], [DATE]

CLINICAL DATA: Follow-up pneumonia

EXAM:
CHEST - 2 VIEW

[chest pa]
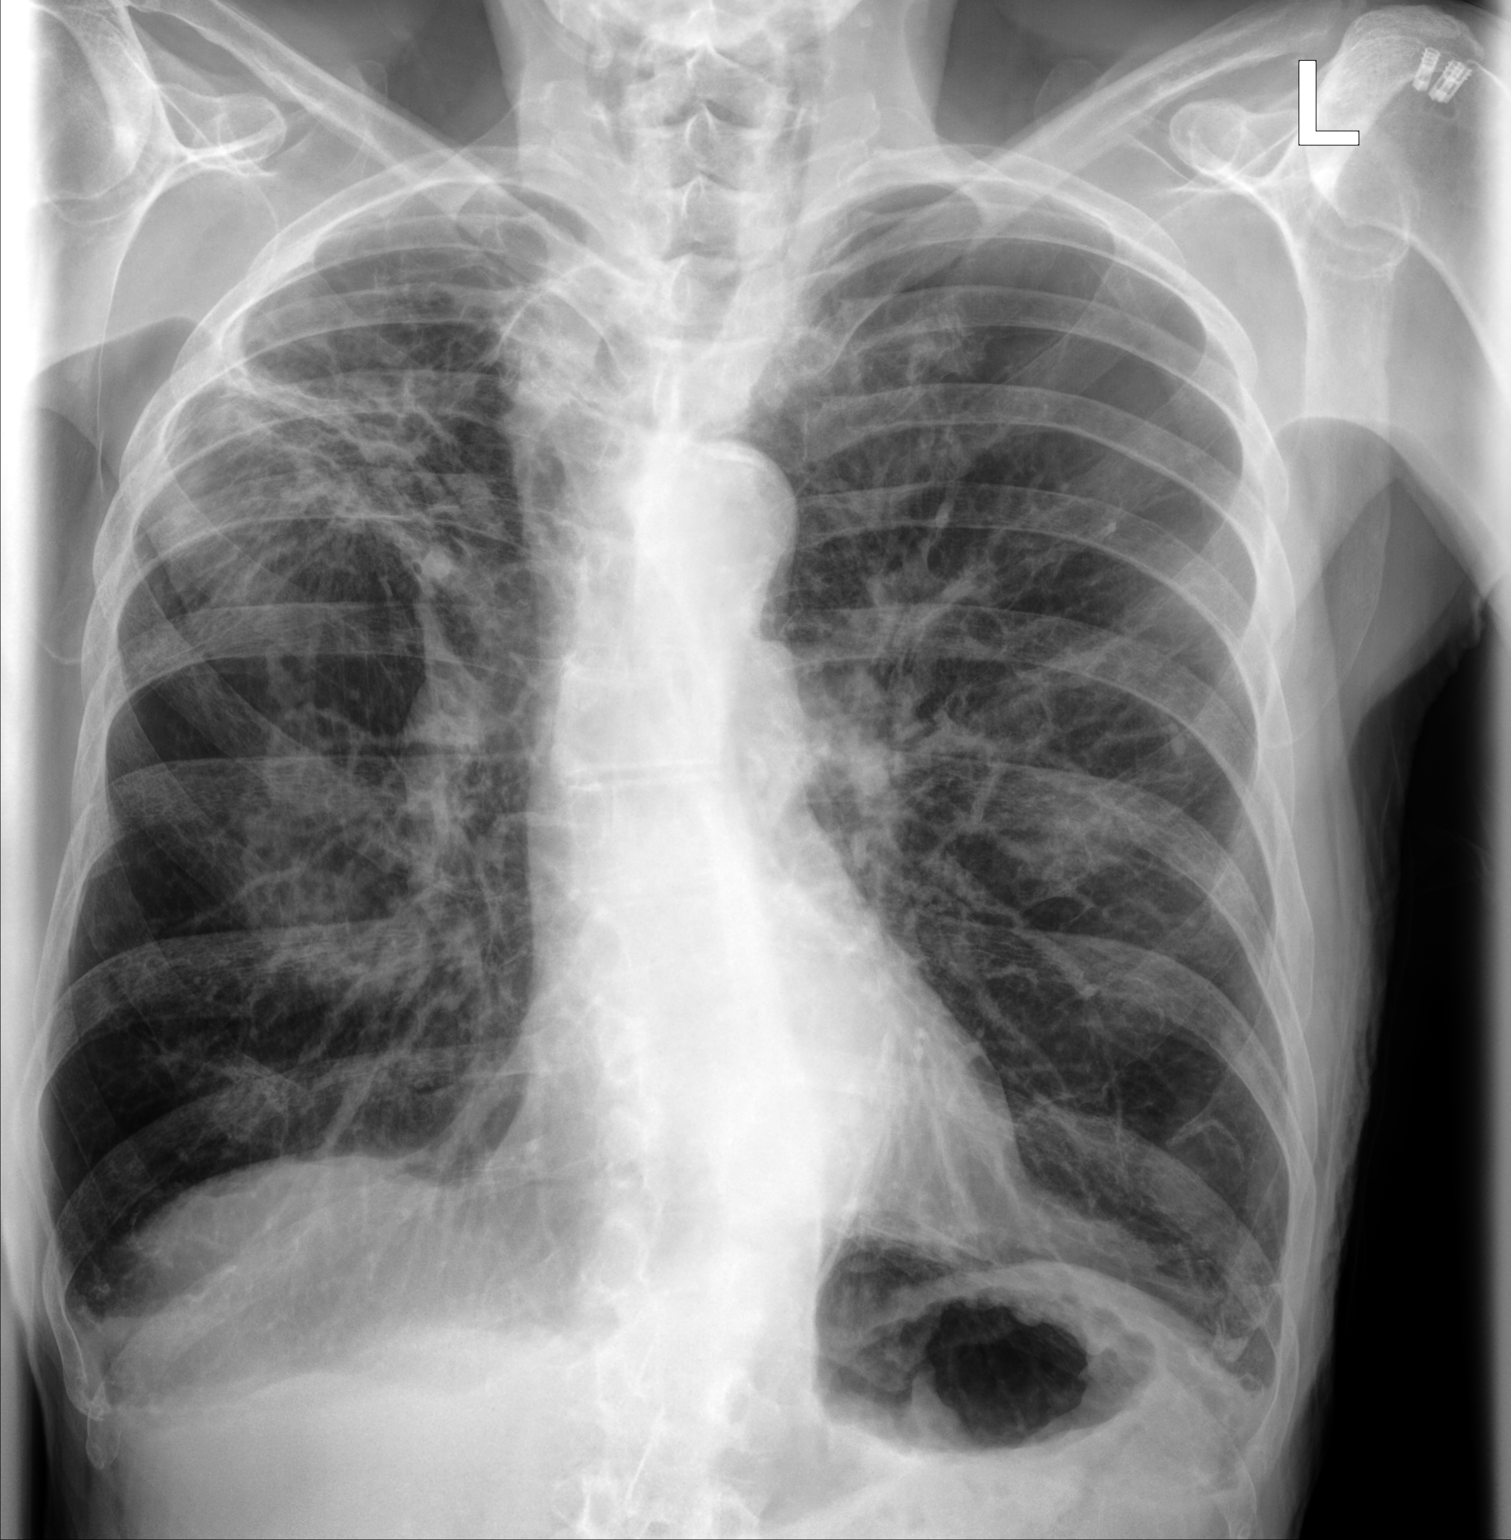

[chest lat]
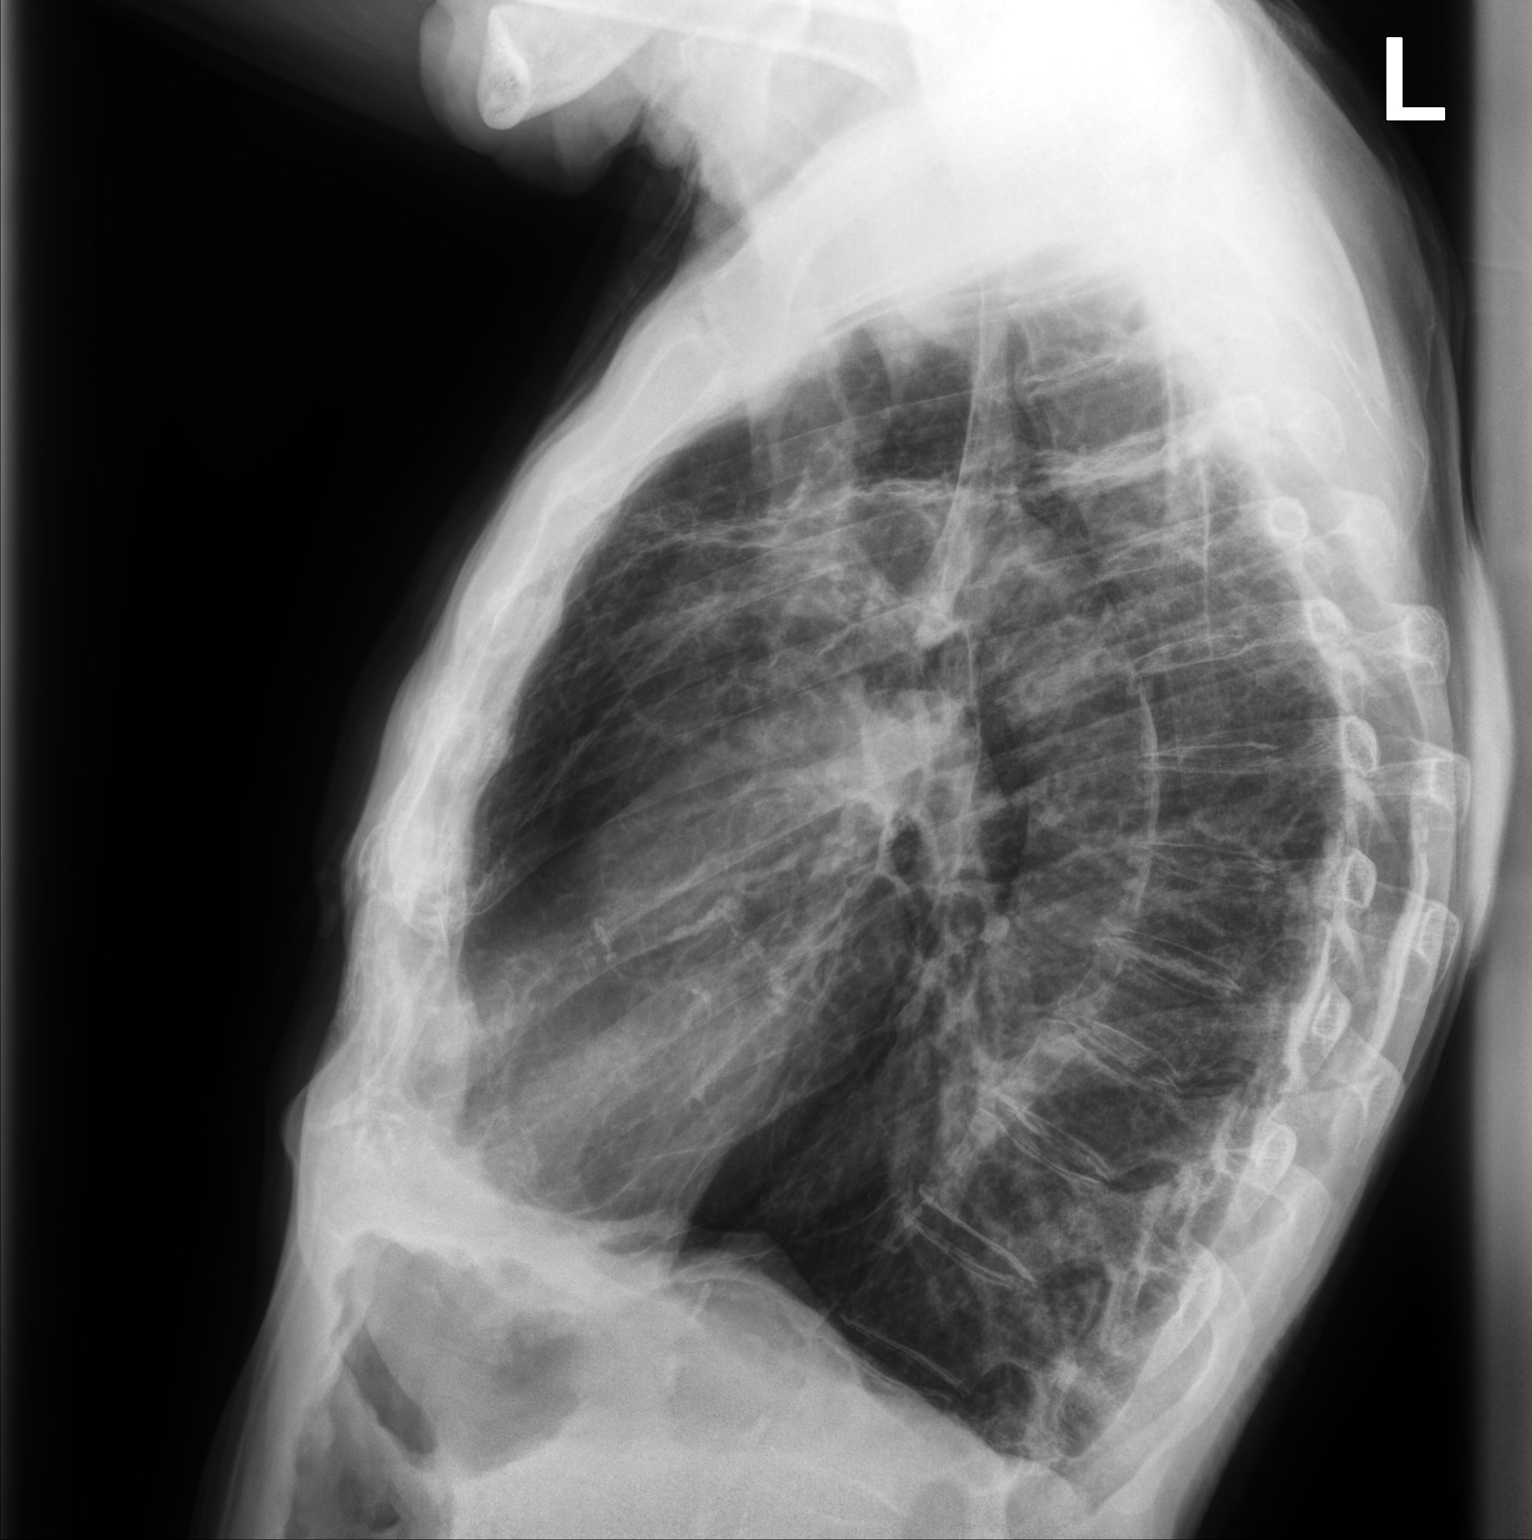

[2 of 2 positions shown; findings below may reference images not displayed]

FINDINGS: Stable cardiomediastinal contours. Calcified thoracic aorta.
Hyperexpanded lungs with flattening of the diaphragms. Right upper
lobe scarring appears stable from prior. Prominent markings in the
right perihilar region are unchanged in appearance from prior study.
No new focal airspace consolidation. No pleural effusion or
pneumothorax.
IMPRESSION: 1. No significant interval change in appearance of right perihilar
opacity. Given the persistence of this finding, further evaluation
with chest CT is recommended.
2. COPD with chronic right apical scarring.

## 2019-09-23 NOTE — Assessment & Plan Note (Signed)
Appears stable   Plan  Patient Instructions  Continue on TRELEGY daily , rinse after use.  Albuterol neb As needed   Use Flutter valve Three times a day -we will place one up front in our office for you to pick up .  Continue on Oxygen 4l/m rest and 5l/m with activity .  Set up for CT chest  Follow up with Dr. Lamonte Sakai  In 2-3 months and As needed   Please contact office for sooner follow up if symptoms do not improve or worsen or seek emergency care

## 2019-09-23 NOTE — Patient Instructions (Signed)
Continue on TRELEGY daily , rinse after use.  Albuterol neb As needed   Use Flutter valve Three times a day -we will place one up front in our office for you to pick up .  Continue on Oxygen 4l/m rest and 5l/m with activity .  Set up for CT chest  Follow up with Dr. Lamonte Sakai  In 2-3 months and As needed   Please contact office for sooner follow up if symptoms do not improve or worsen or seek emergency care

## 2019-09-23 NOTE — Assessment & Plan Note (Signed)
Stable without increased demands   Plan  Patient Instructions  Continue on TRELEGY daily , rinse after use.  Albuterol neb As needed   Use Flutter valve Three times a day -we will place one up front in our office for you to pick up .  Continue on Oxygen 4l/m rest and 5l/m with activity .  Set up for CT chest  Follow up with Dr. Lamonte Sakai  In 2-3 months and As needed   Please contact office for sooner follow up if symptoms do not improve or worsen or seek emergency care

## 2019-09-23 NOTE — Progress Notes (Signed)
@Patient  ID: John Black, male    DOB: 13-Aug-1929, 86 y.o.   [name]: [id number]  Chief Complaint  Patient presents with  . Follow-up    PNA     Referring provider: Alycia Rossetti, MD  HPI: 86 year old male former smoker followed for severe COPD on home oxygen  TEST/EVENTS :    09/23/2019 Follow up : COPD , O2 RF , PNA  Patient presents 1 month follow-up.  Patient was seen last visit for COPD flare and pneumonia follow-up.  He had a fall initially with ongoing rib pain on left. No fractures noted on chest xray . Chest xray showed right infrahilar opacity . He was treated with Zpack .  He returns today improved . Cough and congestion are improved. Dsypnea is back to baseline . Rib pain has resolved. No change in oxygen demands. Remains on O2 4l/m rest and 5l/m activity .  Chest xray shows no change in Right perihilar opacity .  Appetite is very good with no n/v/d. No hemoptysis     Allergies  Allergen Reactions  . Morphine     REACTION: sweats    Immunization History  Administered Date(s) Administered  . Fluad Quad(high Dose 65+) 05/13/2019  . Influenza Whole 05/30/2011, 06/17/2012  . Influenza, High Dose Seasonal PF 05/28/2017, 06/19/2018  . Influenza,inj,Quad PF,6+ Mos 05/28/2013, 06/10/2014, 06/17/2015, 06/22/2016  . Pneumococcal Conjugate-13 07/20/2014  . Pneumococcal Polysaccharide-23 06/19/2012    Past Medical History:  Diagnosis Date  . Allergic rhinitis   . Cataract    s/p removal  . Chronic respiratory failure (HCC)    oxygen 3L at home  . Emphysema   . Hypothyroid   . PNA (pneumonia)   . Prostate cancer ([place])     Tobacco History: Social History   Tobacco Use  Smoking Status Former Smoker  . Packs/day: 1.50  . Years: 50.00  . Pack years: 75.00  . Types: Cigarettes  . Quit date: 09/17/2008  . Years since quitting: 11.0  Smokeless Tobacco Never Used   Counseling given: Not Answered   Outpatient Medications Prior to Visit  Medication  Sig Dispense Refill  . albuterol (PROVENTIL) (2.5 MG/3ML) 0.083% nebulizer solution Take 3 mLs (2.5 mg total) by nebulization every 6 (six) hours as needed for wheezing or shortness of breath. 150 mL 12  . albuterol (VENTOLIN HFA) 108 (90 Base) MCG/ACT inhaler Inhale 2 puffs into the lungs every 6 (six) hours as needed for wheezing or shortness of breath. 18 g 11  . Fluticasone-Umeclidin-Vilant (TRELEGY ELLIPTA) 100-62.5-25 MCG/INH AEPB Inhale 1 puff into the lungs daily. 180 each 3  . levothyroxine (SYNTHROID) 75 MCG tablet TAKE 1 TABLET (75 MCG TOTAL) BY MOUTH DAILY. 90 tablet 3  . LORazepam (ATIVAN) 0.5 MG tablet TAKE 1 TABLET (0.5 MG TOTAL) BY MOUTH 2 (TWO) TIMES DAILY AS NEEDED. 30 tablet 2  . pantoprazole (PROTONIX) 40 MG tablet TAKE 1 TABLET BY MOUTH EVERY DAY 90 tablet 1  . predniSONE (DELTASONE) 10 MG tablet Take 10 mg by mouth daily with breakfast.    . Respiratory Therapy Supplies (FLUTTER) DEVI Use as directed. 1 each 0  . tamsulosin (FLOMAX) 0.4 MG CAPS capsule Take 1 capsule (0.4 mg total) by mouth daily. 90 capsule 3  . theophylline (UNIPHYL) 400 MG 24 hr tablet TAKE 1 TABLET BY MOUTH EVERY DAY 30 tablet 5  . traMADol (ULTRAM) 50 MG tablet Take 1 tablet (50 mg total) by mouth every 8 (eight) hours as needed. for pain 60  tablet 2  . zolpidem (AMBIEN) 5 MG tablet Take 1 tablet (5 mg total) by mouth at bedtime as needed for sleep. 30 tablet 2   No facility-administered medications prior to visit.     Review of Systems:   Constitutional:   No  weight loss, night sweats,  Fevers, chills, fatigue, or  lassitude.  HEENT:   No headaches,  Difficulty swallowing,  Tooth/dental problems, or  Sore throat,                No sneezing, itching, ear ache, nasal congestion, post nasal drip,   CV:  No chest pain,  Orthopnea, PND, swelling in lower extremities, anasarca, dizziness, palpitations, syncope.   GI  No heartburn, indigestion, abdominal pain, nausea, vomiting, diarrhea, change in  bowel habits, loss of appetite, bloody stools.   Resp: No shortness of breath with exertion or at rest.  No excess mucus, no productive cough,  No non-productive cough,  No coughing up of blood.  No change in color of mucus.  No wheezing.  No chest wall deformity  Skin: no rash or lesions.  GU: no dysuria, change in color of urine, no urgency or frequency.  No flank pain, no hematuria   MS:  No joint pain or swelling.  No decreased range of motion.  No back pain.    Physical Exam  BP 112/70 (BP Location: Right Arm, Cuff Size: Normal)   Pulse (!) 108   Temp (!) 97.2 F (36.2 C) (Temporal)   Ht 5\' 6"  (1.676 m)   Wt 119 lb 6.4 oz (54.2 kg)   SpO2 93% Comment: RA  BMI 19.27 kg/m   GEN: A/Ox3; pleasant , NAD, elderly on O2    HEENT:  [place]/AT,  NOSE-clear, THROAT-clear, no lesions, no postnasal drip or exudate noted.   NECK:  Supple w/ fair ROM; no JVD; normal carotid impulses w/o bruits; no thyromegaly or nodules palpated; no lymphadenopathy.    RESP  Decreased BS in bases No accessory muscle use, no dullness to percussion  CARD:  RRR, no m/r/g, no peripheral edema, pulses intact, no cyanosis or clubbing.  GI:   Soft & nt; nml bowel sounds; no organomegaly or masses detected.   Musco: Warm bil, no deformities or joint swelling noted.   Neuro: alert, no focal deficits noted.    Skin: Warm, no lesions or rashes    Lab Results:  CBC  BNP No results found for: BNP   Imaging: DG Chest 2 View  Result Date: 09/23/2019 CLINICAL DATA:  Follow-up pneumonia EXAM: CHEST - 2 VIEW COMPARISON:  08/07/2019, 07/23/2019 FINDINGS: Stable cardiomediastinal contours. Calcified thoracic aorta. Hyperexpanded lungs with flattening of the diaphragms. Right upper lobe scarring appears stable from prior. Prominent markings in the right perihilar region are unchanged in appearance from prior study. No new focal airspace consolidation. No pleural effusion or pneumothorax. IMPRESSION: 1. No  significant interval change in appearance of right perihilar opacity. Given the persistence of this finding, further evaluation with chest CT is recommended. 2. COPD with chronic right apical scarring. Electronically Signed   By: Davina Poke D.O.   On: 09/23/2019 11:32      PFT Results Latest Ref Rng & Units 01/07/2014  FVC-Pre L 3.22  FVC-Predicted Pre % 107  FVC-Post L 3.22  FVC-Predicted Post % 107  Pre FEV1/FVC % % 34  Post FEV1/FCV % % 36  FEV1-Pre L 1.10  FEV1-Predicted Pre % 53  FEV1-Post L 1.17  DLCO UNC% % 47  DLCO COR %Predicted % 52  TLC L 7.15  TLC % Predicted % 120  RV % Predicted % 143    No results found for: NITRICOXIDE      Assessment & Plan:   COPD (chronic obstructive pulmonary disease) with emphysema Appears stable   Plan  Patient Instructions  Continue on TRELEGY daily , rinse after use.  Albuterol neb As needed   Use Flutter valve Three times a day -we will place one up front in our office for you to pick up .  Continue on Oxygen 4l/m rest and 5l/m with activity .  Set up for CT chest  Follow up with Dr. Lamonte Sakai  In 2-3 months and As needed   Please contact office for sooner follow up if symptoms do not improve or worsen or seek emergency care          Chronic respiratory failure Stable without increased demands   Plan  Patient Instructions  Continue on TRELEGY daily , rinse after use.  Albuterol neb As needed   Use Flutter valve Three times a day -we will place one up front in our office for you to pick up .  Continue on Oxygen 4l/m rest and 5l/m with activity .  Set up for CT chest  Follow up with Dr. Lamonte Sakai  In 2-3 months and As needed   Please contact office for sooner follow up if symptoms do not improve or worsen or seek emergency care          Pneumonia Right-sided pneumonia with right perihilar opacity.  Clinically appears to be improved.  Continues to have patchy opacity along the right perihilar area.  We will check  a CT chest to further evaluate.    Total patient care 34 minutes  Rexene Edison, NP 09/23/2019

## 2019-09-23 NOTE — Assessment & Plan Note (Signed)
Right-sided pneumonia with right perihilar opacity.  Clinically appears to be improved.  Continues to have patchy opacity along the right perihilar area.  We will check a CT chest to further evaluate.

## 2019-09-27 DIAGNOSIS — J439 Emphysema, unspecified: Secondary | ICD-10-CM | POA: Diagnosis not present

## 2019-09-29 ENCOUNTER — Other Ambulatory Visit: Payer: Self-pay | Admitting: Family Medicine

## 2019-09-29 NOTE — Telephone Encounter (Signed)
Ok to refill??  Last office visit 08/17/2019.  Last refill 06/02/2019. #2 refills.

## 2019-10-08 ENCOUNTER — Other Ambulatory Visit: Payer: Self-pay

## 2019-10-08 ENCOUNTER — Ambulatory Visit (HOSPITAL_COMMUNITY)
Admission: RE | Admit: 2019-10-08 | Discharge: 2019-10-08 | Disposition: A | Discharge status: Home / Self Care | Payer: PPO | Source: Ambulatory Visit | Attending: Adult Health | Admitting: Adult Health

## 2019-10-08 DIAGNOSIS — R0602 Shortness of breath: Secondary | ICD-10-CM | POA: Diagnosis not present

## 2019-10-08 DIAGNOSIS — J189 Pneumonia, unspecified organism: Secondary | ICD-10-CM | POA: Insufficient documentation

## 2019-10-08 IMAGING — CT CT CHEST W/O CM
2 of 4 series · 15 of 36 positions shown, 18 images · non-contrast
Comparison: [DATE]

CLINICAL DATA: Chronic shortness of breath.

EXAM:
CT CHEST WITHOUT CONTRAST
TECHNIQUE: Multidetector CT imaging of the chest was performed following the
standard protocol without IV contrast.

[Series 2: routine chest without · axial · non-contrast · 0.75mm/px · z∈[-409,-113]mm · 12 of 176 slices shown, 15 images]
[im 14/176  mediastinal]
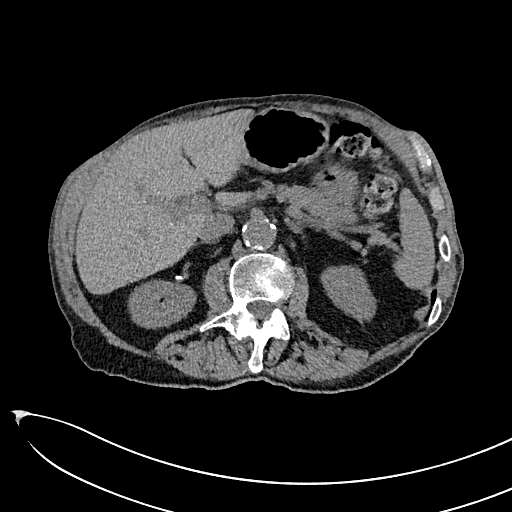
[im 14/176  lung]
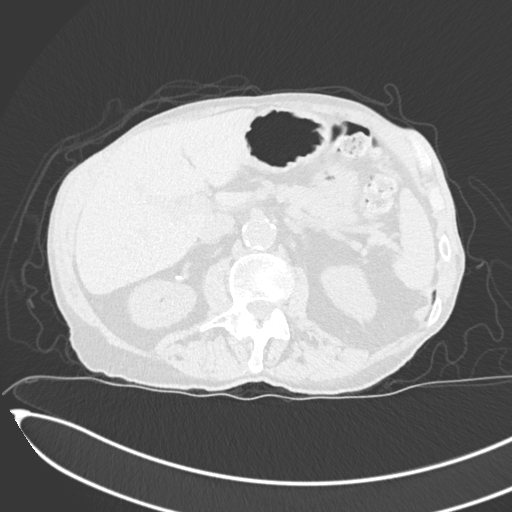
[im 27/176  lung]
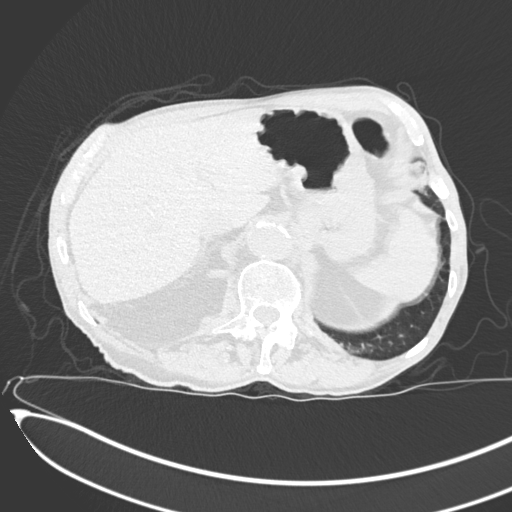
[im 41/176  lung]
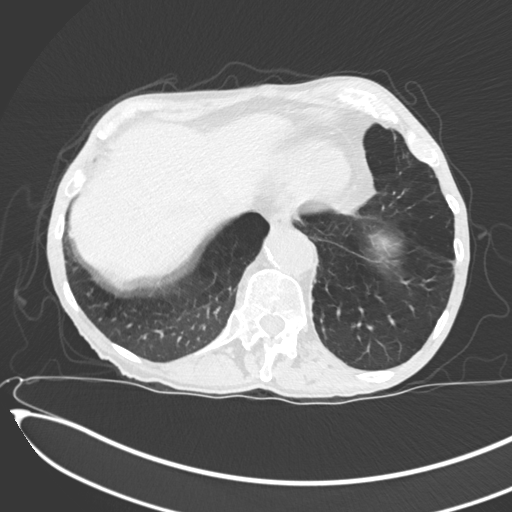
[im 54/176  lung]
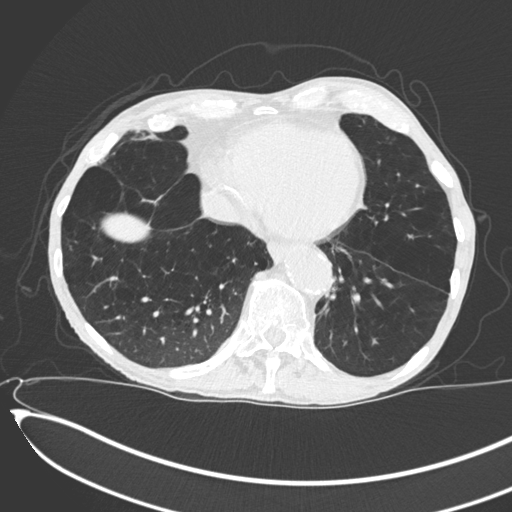
[im 68/176  mediastinal]
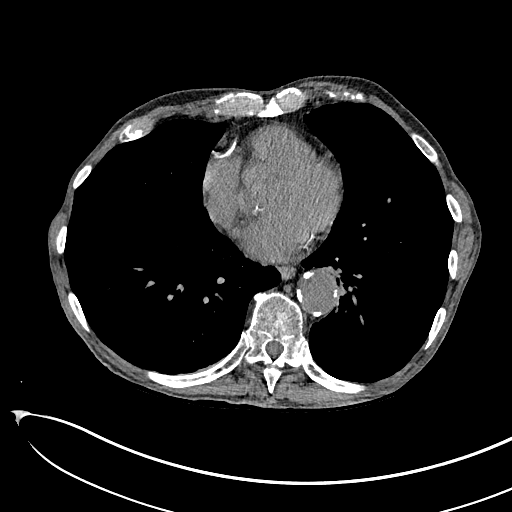
[im 68/176  lung]
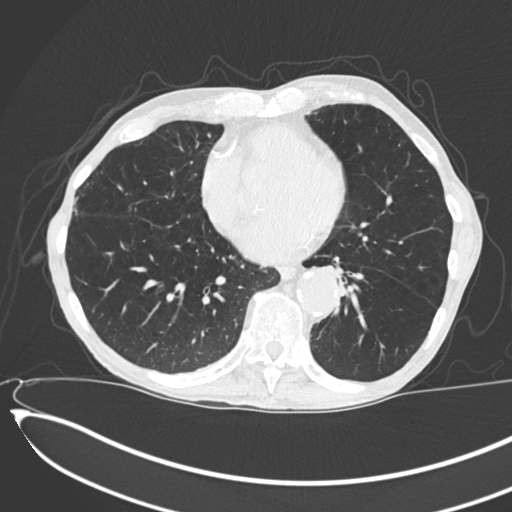
[im 81/176  lung]
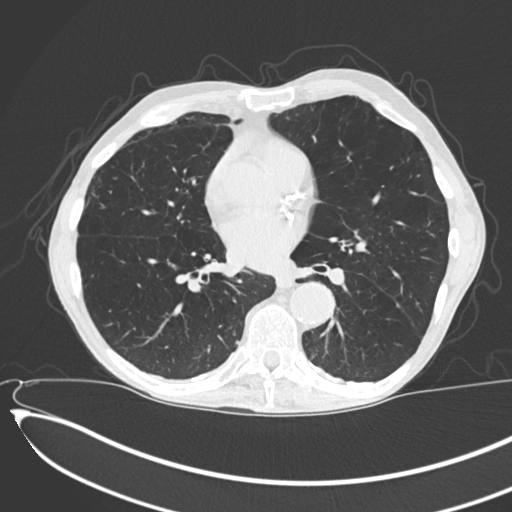
[im 95/176  lung]
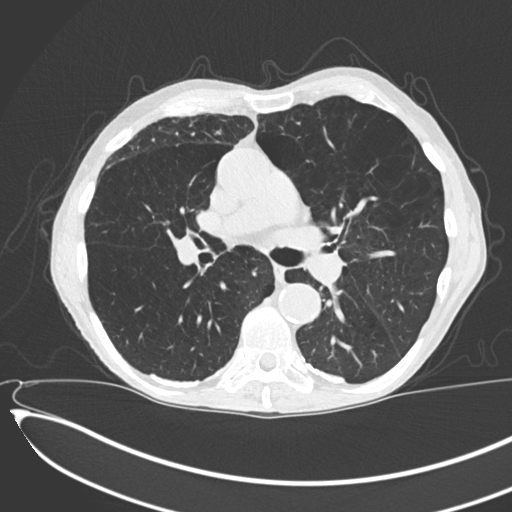
[im 108/176  lung]
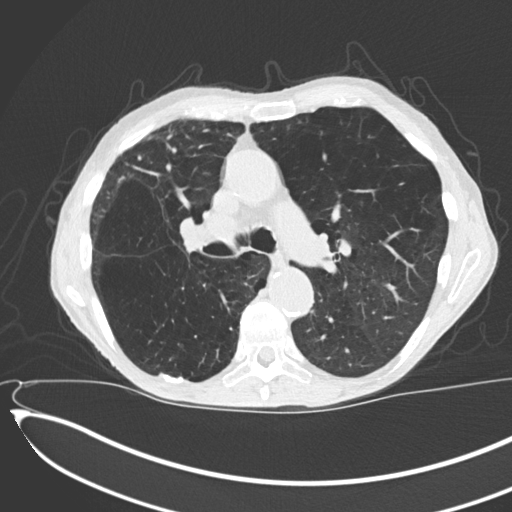
[im 122/176  mediastinal]
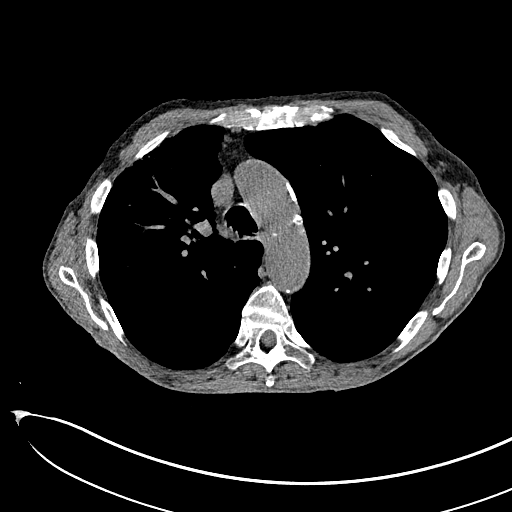
[im 122/176  lung]
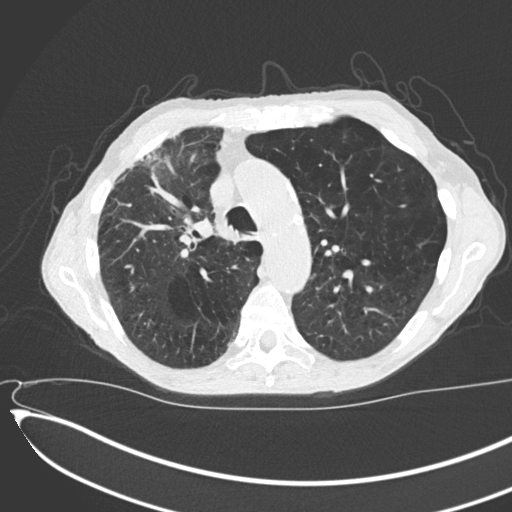
[im 135/176  lung]
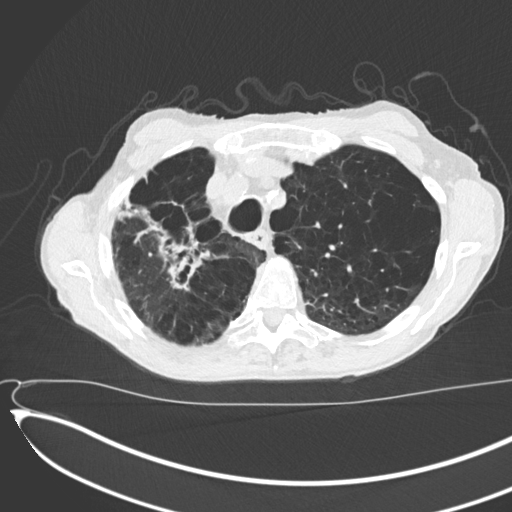
[im 149/176  lung]
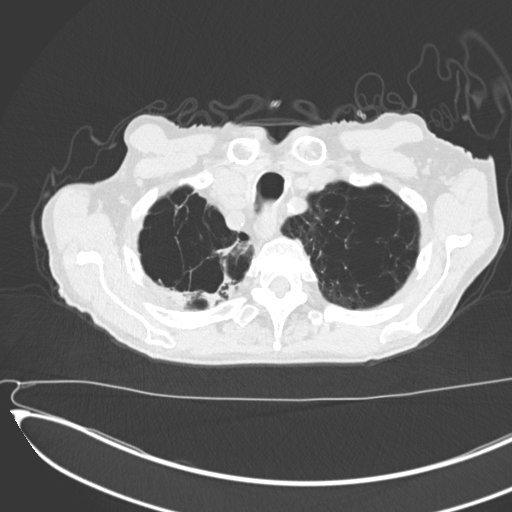
[im 162/176  lung]
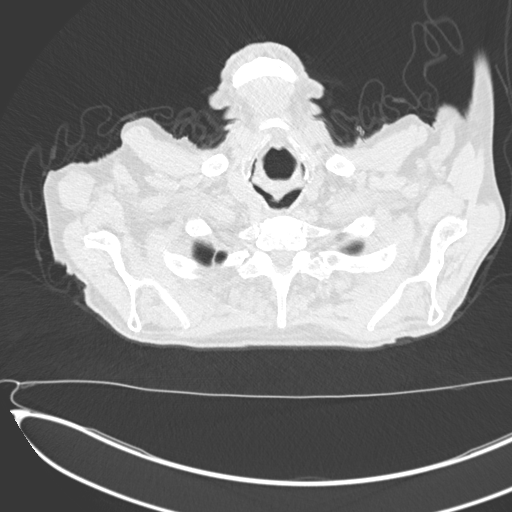

[Series 5: coronal · coronal · 0.71mm/px · 3 of 138 slices shown]
[im 28/138  lung]
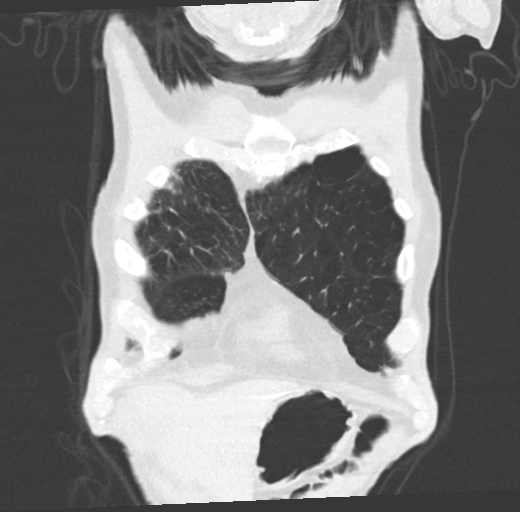
[im 55/138  lung]
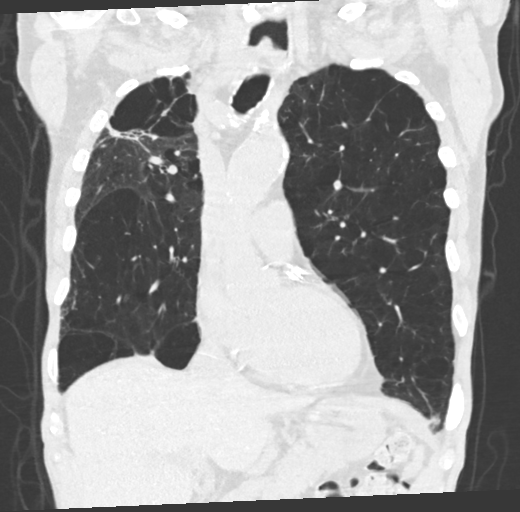
[im 83/138  lung]
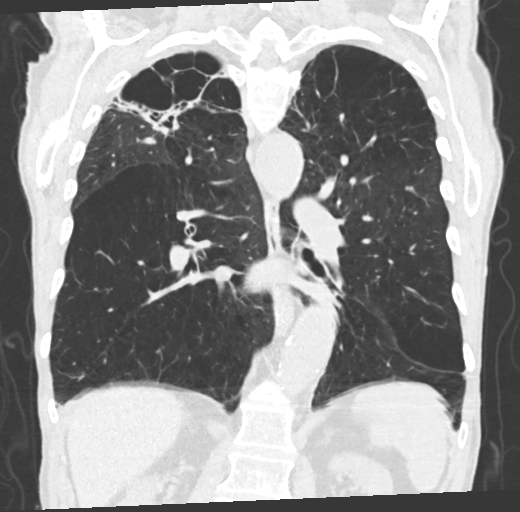

[15 of 36 positions shown; findings below may reference images not displayed]

FINDINGS: Cardiovascular: There is moderate severity calcification of the
thoracic aorta. Normal heart size. No pericardial effusion. Marked
severity coronary artery calcification is seen.

Mediastinum/Nodes: No enlarged mediastinal or axillary lymph nodes.
Thyroid gland, trachea, and esophagus demonstrate no significant
findings.

Lungs/Pleura: Lungs are hyperinflated.

Marked severity emphysematous lung disease is seen.

Stable moderate severity scarring and/or atelectasis is seen within
the right upper lobe.

There is no evidence of a pleural effusion or pneumothorax.

Mild pleural base calcification is seen bilaterally.

Upper Abdomen: A 3 mm nonobstructing renal stone is seen within the
posterior aspect of the mid right kidney.

Musculoskeletal: Multilevel degenerative changes seen throughout the
thoracic spine.
IMPRESSION: 1. Stable moderate severity right upper lobe scarring and/or
atelectasis, unchanged in appearance when compared to the prior
study dated [DATE] [DATE], [DATE].
[DATE]. Marked severity emphysematous lung disease.
3. Pleural base calcification which may represent sequelae
associated with prior asbestos exposure.

## 2019-10-13 ENCOUNTER — Telehealth: Payer: Self-pay | Admitting: Adult Health

## 2019-10-13 NOTE — Progress Notes (Signed)
Called spoke with patient, advised of CT Chest results / recs as stated by Parrett NP.  Pt verbalized understanding and denied any questions.

## 2019-10-13 NOTE — Telephone Encounter (Signed)
When speaking with patient earlier today about CT Chest results, patient and spouse had rquested that information/question be passed to TP:  Yesterday after patient came downstairs at home, sats were 75% on 4L pulsed.  Spouse Shirlee immediately placed him on his normal 4L continuous and sats quickly rebounded to 95%.  Patient and spouse wondering if they need a new Inogen - the G5 that goes up to 6L pulsed.  Patient's current Inogen POC goes up to 5L pulsed.  Advised would speak with TP and call them back.   Spoke with TP at length about the above.  Per TP: am happy to write Rx for the G5 but it may not make much difference because he needs continuous O2 rather than pulsed.  We wrote the Rx for the Inogen so that he could remain active, leave the house, etc without worrying about running out of oxygen.   Called spoke with patient and spouse Jaci Standard again with TP's recommendations.  Patient and spouse voiced their understanding and would like to further discuss whether or not to purchase a G5 and will call us back if they decide to move forward with it.  Nothing further needed at this time; will sign off.

## 2019-10-28 DIAGNOSIS — J439 Emphysema, unspecified: Secondary | ICD-10-CM | POA: Diagnosis not present

## 2019-11-04 DIAGNOSIS — Z08 Encounter for follow-up examination after completed treatment for malignant neoplasm: Secondary | ICD-10-CM | POA: Diagnosis not present

## 2019-11-04 DIAGNOSIS — Z85828 Personal history of other malignant neoplasm of skin: Secondary | ICD-10-CM | POA: Diagnosis not present

## 2019-11-08 ENCOUNTER — Other Ambulatory Visit: Payer: Self-pay | Admitting: Emergency Medicine

## 2019-11-09 ENCOUNTER — Other Ambulatory Visit: Payer: Self-pay | Admitting: Emergency Medicine

## 2019-11-11 ENCOUNTER — Ambulatory Visit (INDEPENDENT_AMBULATORY_CARE_PROVIDER_SITE_OTHER): Payer: PPO | Admitting: Family Medicine

## 2019-11-11 ENCOUNTER — Encounter: Payer: Self-pay | Admitting: Family Medicine

## 2019-11-11 ENCOUNTER — Other Ambulatory Visit: Payer: Self-pay

## 2019-11-11 VITALS — BP 118/64 | HR 112 | Temp 98.1°F | Resp 22 | Ht 66.0 in | Wt 117.0 lb

## 2019-11-11 DIAGNOSIS — E440 Moderate protein-calorie malnutrition: Secondary | ICD-10-CM | POA: Diagnosis not present

## 2019-11-11 DIAGNOSIS — K5904 Chronic idiopathic constipation: Secondary | ICD-10-CM

## 2019-11-11 DIAGNOSIS — J9611 Chronic respiratory failure with hypoxia: Secondary | ICD-10-CM

## 2019-11-11 DIAGNOSIS — E785 Hyperlipidemia, unspecified: Secondary | ICD-10-CM

## 2019-11-11 DIAGNOSIS — E039 Hypothyroidism, unspecified: Secondary | ICD-10-CM

## 2019-11-11 DIAGNOSIS — J438 Other emphysema: Secondary | ICD-10-CM

## 2019-11-11 DIAGNOSIS — Z0000 Encounter for general adult medical examination without abnormal findings: Secondary | ICD-10-CM

## 2019-11-11 DIAGNOSIS — R2681 Unsteadiness on feet: Secondary | ICD-10-CM | POA: Diagnosis not present

## 2019-11-11 NOTE — Patient Instructions (Addendum)
F/U 4 months ( 30 minute slot) Take the miralax three times a week, to help empty his bowels

## 2019-11-11 NOTE — Progress Notes (Signed)
Subjective:   Patient presents for Medicare Annual/Subsequent preventive examination.   Patient here for Medicare wellness.  Medications reviewed.  COPD oxygen dependent has been his major issue.  Is following with pulmonary.  He had a CT scan done in January which showed emphysematous changes and some chronic calcifications.  He had recent pneumonia with COPD flare in December.  O2 is typically about 4 L at rest up to 5 L with activity.  He is still on Trelegy and his albuterol/ theophylline / prednisone 10mg  daily   Chronic insomnia he had telehealth visit for his insomnia back in November he wants to go back on Ambien for quality of life.  Came off the medication earlier last year due to some dizzy spells but other medications for sleep did not help.  Thus far he has tolerated 5 mg of Ambien  Constipation he has incontinence of his bowel sometimes.  He feels like he needs to go but often does not have a bowel movement.  His wife that sometimes he does leak when he does have incontinence.  They have not been using the MiraLAX  Review Past Medical/Family/Social: per EMR    Risk Factors  Current exercise habits: No exercise program  Dietary issues discussed: Protein malnutrition   Cardiac risk factors: COPD/Emphysema  , Hyperlipidemia   Depression Screen  (Note: if answer to either of the following is "Yes", a more complete depression screening is indicated)  Over the past two weeks, have you felt down, depressed or hopeless? No Over the past two weeks, have you felt little interest or pleasure in doing things? No Have you lost interest or pleasure in daily life? No Do you often feel hopeless? No Do you cry easily over simple problems? No   Activities of Daily Living  In your present state of health, do you have any difficulty performing the following activities?:  Driving?Yes  Managing money? No  Feeding yourself? No  Getting from bed to chair? No  Climbing a flight of stairs? Yes   Preparing food and eating?: Yes- preparing food  Bathing or showering?  Yes- difficulty standing in shower, gets to winded   Getting dressed: No  Getting to the toilet? No  Using the toilet:No  Moving around from place to place: some times due to breathing   In the past year have you fallen or had a near fall?:yes  Are you sexually active? No  Do you have more than one partner? No   Hearing Difficulties: Yes  Do you often ask people to speak up or repeat themselves?  Yes  Do you experience ringing or noises in your ears? No Do you have difficulty understanding soft or whispered voices? No  Do you feel that you have a problem with memory? No Do you often misplace items? No  Do you feel safe at home? Yes  Cognitive Testing  Alert? Yes Normal Appearance?Yes  Oriented to person? Yes Place? Yes  Time? Yes  Recall of three objects? Yes  Can perform simple calculations? Yes  Displays appropriate judgment?Yes  Can read the correct time from a watch face?Yes   List the Names of Other Physician/Practitioners you currently use:   Pulmonary  Dermatology  Urology prn  opthalology   Screening Tests / Date Colonoscopy over max age                    Pneumonia UTD Influenza Vaccine  UTD  COVID-19 vaccine completed   ROS: GEN- denies  fatigue, fever, weight loss,weakness, recent illness HEENT- denies eye drainage, change in vision, nasal discharge, CVS- denies chest pain, palpitations RESP- denies SOB, cough, wheeze ABD- denies N/V, change in stools, abd pain GU- denies dysuria, hematuria, dribbling, incontinence MSK- denies joint pain, muscle aches, injury Neuro- denies headache, dizziness, syncope, seizure activity  Physical: Vitals reviewed  GEN- NAD, alert and oriented x3 , elderly male - CHRONICALLY ill appearing, 4 L oxygen  HEENT- PERRL, EOMI, non injected sclera, pink conjunctiva, MMM, oropharynx clear Neck- Supple, no thryomegaly CVS- mild tachycardia HR 100 , no  murmur RESP-CTAB ABD-NABS,soft,nt,ND EXT- No edema Pulses- Radial, DP- 2+   Assessment:    Annual wellness medicare exam   Plan:    During the course of the visit the patient was educated and counseled about appropriate screening and preventive services including:  CAGE/DEPRESSION SCREEN NEGATIVE   IMMUNIZATIONS- UTD  COPD/emphysema- followed by pulmonary/ + recent exacerbations, high oxygen dependency   Mild hyperlipidemia due for recheck today.  No current medications  Protein calorie malnutrition.  He has been able to maintain his weight the past few months.  He is drinking Ensure with each meal.  Chronic insomnia he has not had any dizzy spells with the Ambien.  He prefers quality of life and can sleep better with this medication  Gait instability generalized weakness unfortunately his health has been declining the setting of his severe pulmonary disease.  Shower chair as needed to assist with ADLs  Chronic pain/OA occ use of ultram   Constipation.  I think that he may be having some fecal incontinence and leaking stools around the constipation.  He feels a sensation of not emptying his bowels all the way.  Advised him to try the MiraLAX they have also cut back on his dairy which has helped some.  History of prostate cancer no longer checking PSA  Diet review for nutrition referral? Yes ____ Not Indicated __x__  Patient Instructions (the written plan) was given to the patient.  Medicare Attestation  I have personally reviewed:  The patient's medical and social history  Their use of alcohol, tobacco or illicit drugs  Their current medications and supplements  The patient's functional ability including ADLs,fall risks, home safety risks, cognitive, and hearing and visual impairment  Diet and physical activities  Evidence for depression or mood disorders  The patient's weight, height, BMI, and visual acuity have been recorded in the chart. I have made referrals,  counseling, and provided education to the patient based on review of the above and I have provided the patient with a written personalized care plan for preventive services.

## 2019-11-12 LAB — LIPID PANEL
Cholesterol: 261 mg/dL — ABNORMAL HIGH (ref <200)
HDL: 100 mg/dL (ref >=40)
LDL Cholesterol (Calc): 133 mg/dL (calc) — ABNORMAL HIGH
Non-HDL Cholesterol (Calc): 161 mg/dL (calc) — ABNORMAL HIGH (ref <130)
Total CHOL/HDL Ratio: 2.6 (calc) (ref <5.0)
Triglycerides: 149 mg/dL (ref <150)

## 2019-11-12 LAB — COMPREHENSIVE METABOLIC PANEL
AG Ratio: 2.1 (calc) (ref 1.0–2.5)
ALT: 15 U/L (ref 9–46)
AST: 21 U/L (ref 10–35)
Albumin: 4.2 g/dL (ref 3.6–5.1)
Alkaline phosphatase (APISO): 63 U/L (ref 35–144)
BUN: 20 mg/dL (ref 7–25)
CO2: 31 mmol/L (ref 20–32)
Calcium: 9.8 mg/dL (ref 8.6–10.3)
Chloride: 101 mmol/L (ref 98–110)
Creat: 0.96 mg/dL (ref 0.70–1.11)
Globulin: 2 g/dL (calc) (ref 1.9–3.7)
Glucose, Bld: 94 mg/dL (ref 65–99)
Potassium: 4.4 mmol/L (ref 3.5–5.3)
Sodium: 139 mmol/L (ref 135–146)
Total Bilirubin: 0.5 mg/dL (ref 0.2–1.2)
Total Protein: 6.2 g/dL (ref 6.1–8.1)

## 2019-11-12 LAB — CBC WITH DIFFERENTIAL/PLATELET
Absolute Monocytes: 595 cells/uL (ref 200–950)
Basophils Absolute: 51 cells/uL (ref 0–200)
Basophils Relative: 0.6 %
Eosinophils Absolute: 111 cells/uL (ref 15–500)
Eosinophils Relative: 1.3 %
HCT: 41.4 % (ref 38.5–50.0)
Hemoglobin: 14 g/dL (ref 13.2–17.1)
Lymphs Abs: 808 cells/uL — ABNORMAL LOW (ref 850–3900)
MCH: 30.8 pg (ref 27.0–33.0)
MCHC: 33.8 g/dL (ref 32.0–36.0)
MCV: 91 fL (ref 80.0–100.0)
MPV: 9.8 fL (ref 7.5–12.5)
Monocytes Relative: 7 %
Neutro Abs: 6936 cells/uL (ref 1500–7800)
Neutrophils Relative %: 81.6 %
Platelets: 335 10*3/uL (ref 140–400)
RBC: 4.55 10*6/uL (ref 4.20–5.80)
RDW: 13.9 % (ref 11.0–15.0)
Total Lymphocyte: 9.5 %
WBC: 8.5 10*3/uL (ref 3.8–10.8)

## 2019-11-12 LAB — TSH: TSH: 1.59 mIU/L (ref 0.40–4.50)

## 2019-11-12 LAB — T4, FREE: Free T4: 1.6 ng/dL (ref 0.8–1.8)

## 2019-11-13 ENCOUNTER — Encounter: Payer: Self-pay | Admitting: *Deleted

## 2019-11-13 ENCOUNTER — Other Ambulatory Visit: Payer: Self-pay | Admitting: Family Medicine

## 2019-11-13 NOTE — Telephone Encounter (Signed)
Ok to refill??  Last office visit 11/11/2019.  Last refill 08/17/2019, #2 refills.

## 2019-11-16 ENCOUNTER — Telehealth: Payer: Self-pay | Admitting: Family Medicine

## 2019-11-16 DIAGNOSIS — H9190 Unspecified hearing loss, unspecified ear: Secondary | ICD-10-CM

## 2019-11-16 NOTE — Telephone Encounter (Signed)
Okay 

## 2019-11-16 NOTE — Telephone Encounter (Signed)
Referral orders placed

## 2019-11-16 NOTE — Telephone Encounter (Signed)
Received a forwarded voicemail from [facility] requesting a referral for audio for this patient. Is it ok to place this referral?

## 2019-11-25 DIAGNOSIS — J439 Emphysema, unspecified: Secondary | ICD-10-CM | POA: Diagnosis not present

## 2019-12-08 ENCOUNTER — Other Ambulatory Visit: Payer: Self-pay | Admitting: Family Medicine

## 2019-12-08 NOTE — Telephone Encounter (Signed)
Ok to refill??  Last office visit 11/11/2019.  Last refill 09/29/2019.

## 2019-12-16 DIAGNOSIS — H903 Sensorineural hearing loss, bilateral: Secondary | ICD-10-CM | POA: Diagnosis not present

## 2019-12-21 ENCOUNTER — Telehealth: Payer: Self-pay | Admitting: Adult Health

## 2019-12-21 NOTE — Telephone Encounter (Signed)
Spoke with the pt  He states cancelled appt with TP for this wk because he is feeling better and also prefers to see Dr Lamonte Sakai next available  I have placed a reminder for this  Nothing further needed

## 2019-12-23 ENCOUNTER — Ambulatory Visit: Payer: PPO | Admitting: Adult Health

## 2019-12-26 DIAGNOSIS — J439 Emphysema, unspecified: Secondary | ICD-10-CM | POA: Diagnosis not present

## 2020-01-20 DIAGNOSIS — H903 Sensorineural hearing loss, bilateral: Secondary | ICD-10-CM | POA: Diagnosis not present

## 2020-01-22 ENCOUNTER — Encounter: Payer: Self-pay | Admitting: Family Medicine

## 2020-01-22 ENCOUNTER — Other Ambulatory Visit: Payer: Self-pay

## 2020-01-22 ENCOUNTER — Ambulatory Visit (INDEPENDENT_AMBULATORY_CARE_PROVIDER_SITE_OTHER): Payer: PPO | Admitting: Family Medicine

## 2020-01-22 VITALS — BP 134/78 | HR 120 | Temp 98.2°F | Resp 24 | Ht 66.0 in | Wt 118.0 lb

## 2020-01-22 DIAGNOSIS — Z8546 Personal history of malignant neoplasm of prostate: Secondary | ICD-10-CM

## 2020-01-22 DIAGNOSIS — R319 Hematuria, unspecified: Secondary | ICD-10-CM | POA: Diagnosis not present

## 2020-01-22 DIAGNOSIS — K5904 Chronic idiopathic constipation: Secondary | ICD-10-CM | POA: Diagnosis not present

## 2020-01-22 LAB — URINALYSIS, ROUTINE W REFLEX MICROSCOPIC
Bacteria, UA: NONE SEEN /HPF
Bilirubin Urine: NEGATIVE
Bilirubin Urine: NEGATIVE
Glucose, UA: NEGATIVE
Glucose, UA: NEGATIVE
Hyaline Cast: NONE SEEN /LPF
Ketones, ur: NEGATIVE
Ketones, ur: NEGATIVE
Leukocytes,Ua: NEGATIVE
Leukocytes,Ua: NEGATIVE
Nitrite: NEGATIVE
Nitrite: NEGATIVE
Specific Gravity, Urine: 1.027 (ref 1.001–1.03)
Specific Gravity, Urine: 1.02 (ref 1.001–1.03)
Squamous Epithelial / HPF: NONE SEEN /HPF (ref <=5)
WBC, UA: NONE SEEN /HPF (ref 0–5)
pH: 5.5 (ref 5.0–8.0)
pH: 6.5 (ref 5.0–8.0)

## 2020-01-22 LAB — MICROSCOPIC MESSAGE

## 2020-01-22 NOTE — Assessment & Plan Note (Addendum)
He also has history of both constipation and chronic diarrhea. Advised wife to decrease his stool softener to just 1 a day and he can take this a few times a week. If his hemorrhoids are causing any discomfort or bleeding he only to  try Preparation H   He does have microscopic hematuria noted in the urine. We will send this off for culture. He also has a known history of prostate cancer the recurrence of this or even bladder cancer is also possibility for his hematuria. He does not have classic symptoms of kidney stones and abdominal exam is benign.  Labs are prolonged and urinalysis to be performed. These are both benign the next step will be imaging for the hematuria/ urology   D/C NSAIDS in setting of bleeding and chronic prednisone, he had gastritis before, use tylenol for any aches and pains

## 2020-01-22 NOTE — Progress Notes (Signed)
Subjective:    Patient ID: John Black, male    DOB: 01-27-1929, 86 y.o.   [name]: [id number]  Patient presents for Hematuria (x10 days- pink colored urine- denies pain)   4 months ago had spells of hematuria, then ir resolved after a couple days ago, then 2 weeks go had blood an it resolved again, then past week he noticed a pink tinge to dark color in urine 3-4 times when he urinated.  He denies any abd pain, no back pain, no soreness when he urinates.   Is not noted any blood in the stool but he does have some hemorrhoids and he also gets constipation.  He is now just using stool softener every now and then as if he takes it every day he will get diarrhea.  Sometimes he does feel a fullness in his rectal area but no pain.  He has been taking ibuprofen a few nights because of some neck discomfort recently.  Review Of Systems:  GEN- denies fatigue, fever, weight loss,weakness, recent illness HEENT- denies eye drainage, change in vision, nasal discharge, CVS- denies chest pain, palpitations RESP- denies SOB, cough, wheeze ABD- denies N/V, +change in stools, abd pain GU- denies dysuria, +hematuria, dribbling, incontinence MSK- +joint pain, muscle aches, injury Neuro- denies headache, dizziness, syncope, seizure activity       Objective:    BP 134/78   Pulse (!) 120   Temp 98.2 F (36.8 C) (Temporal)   Resp (!) 24   Ht 5\' 6"  (1.676 m)   Wt 118 lb (53.5 kg)   SpO2 90% Comment: 4L/min via [place]  BMI 19.05 kg/m  GEN- NAD, alert and oriented x3 HEENT- PERRL, EOMI, non injected sclera, pink conjunctiva, MMM, oropharynx clear Neck- Supple, fair ROM  CVS- RR tachycardia, HR  100 , no murmur RESP-few scattered wheeze WOB at baseline on 4L  ABD-NABS,soft,NT,ND, no CVA tenderness  EXT- No edema Pulses- Radial  2+        Assessment & Plan:      Problem List Items Addressed This Visit      Unprioritized   Constipation    He also has history of both constipation and  chronic diarrhea. Advised wife to decrease his stool softener to just 1 a day and he can take this a few times a week. If his hemorrhoids are causing any discomfort or bleeding he only to  try Preparation H   He does have microscopic hematuria noted in the urine. We will send this off for culture. He also has a known history of prostate cancer the recurrence of this or even bladder cancer is also possibility for his hematuria. He does not have classic symptoms of kidney stones and abdominal exam is benign.  Labs are prolonged and urinalysis to be performed. These are both benign the next step will be imaging for the hematuria/ urology   D/C NSAIDS in setting of bleeding and chronic prednisone, he had gastritis before, use tylenol for any aches and pains       History of prostate cancer   Relevant Orders   PSA    Other Visit Diagnoses    Hematuria, unspecified type    -  Primary   Relevant Orders   Urinalysis, Routine w reflex microscopic (Completed)   Urine Culture   CBC with Differential/Platelet   Comprehensive metabolic panel   PSA   Urinalysis, Routine w reflex microscopic      Note: This dictation was prepared with Dragon dictation  along with smaller phrase technology. Any transcriptional errors that result from this process are unintentional.

## 2020-01-22 NOTE — Patient Instructions (Signed)
F/U PENDING results

## 2020-01-23 LAB — COMPREHENSIVE METABOLIC PANEL
AG Ratio: 2.3 (calc) (ref 1.0–2.5)
ALT: 15 U/L (ref 9–46)
AST: 20 U/L (ref 10–35)
Albumin: 4.4 g/dL (ref 3.6–5.1)
Alkaline phosphatase (APISO): 52 U/L (ref 35–144)
BUN: 20 mg/dL (ref 7–25)
CO2: 29 mmol/L (ref 20–32)
Calcium: 10 mg/dL (ref 8.6–10.3)
Chloride: 97 mmol/L — ABNORMAL LOW (ref 98–110)
Creat: 0.96 mg/dL (ref 0.70–1.11)
Globulin: 1.9 g/dL (calc) (ref 1.9–3.7)
Glucose, Bld: 128 mg/dL — ABNORMAL HIGH (ref 65–99)
Potassium: 4.4 mmol/L (ref 3.5–5.3)
Sodium: 134 mmol/L — ABNORMAL LOW (ref 135–146)
Total Bilirubin: 0.5 mg/dL (ref 0.2–1.2)
Total Protein: 6.3 g/dL (ref 6.1–8.1)

## 2020-01-23 LAB — CBC WITH DIFFERENTIAL/PLATELET
Absolute Monocytes: 546 cells/uL (ref 200–950)
Basophils Absolute: 43 cells/uL (ref 0–200)
Basophils Relative: 0.4 %
Eosinophils Absolute: 64 cells/uL (ref 15–500)
Eosinophils Relative: 0.6 %
HCT: 42.2 % (ref 38.5–50.0)
Hemoglobin: 14.1 g/dL (ref 13.2–17.1)
Lymphs Abs: 492 cells/uL — ABNORMAL LOW (ref 850–3900)
MCH: 30.7 pg (ref 27.0–33.0)
MCHC: 33.4 g/dL (ref 32.0–36.0)
MCV: 91.7 fL (ref 80.0–100.0)
MPV: 10.2 fL (ref 7.5–12.5)
Monocytes Relative: 5.1 %
Neutro Abs: 9555 cells/uL — ABNORMAL HIGH (ref 1500–7800)
Neutrophils Relative %: 89.3 %
Platelets: 315 10*3/uL (ref 140–400)
RBC: 4.6 10*6/uL (ref 4.20–5.80)
RDW: 12.7 % (ref 11.0–15.0)
Total Lymphocyte: 4.6 %
WBC: 10.7 10*3/uL (ref 3.8–10.8)

## 2020-01-23 LAB — PSA: PSA: 10.1 ng/mL — ABNORMAL HIGH (ref <=4.0)

## 2020-01-23 LAB — URINE CULTURE
MICRO NUMBER:: 10452497
Result:: NO GROWTH
SPECIMEN QUALITY:: ADEQUATE

## 2020-01-25 ENCOUNTER — Telehealth: Payer: Self-pay | Admitting: Family Medicine

## 2020-01-25 DIAGNOSIS — J439 Emphysema, unspecified: Secondary | ICD-10-CM | POA: Diagnosis not present

## 2020-01-25 NOTE — Chronic Care Management (AMB) (Signed)
  Chronic Care Management   Note  01/25/2020 Name: KARANVEER LEMBERG [name]: [id number] DOB: [birth year]  XAYVIER MOCCIA is a 86 y.o. year old male who is a primary care patient of [place], [place]ena Nunnery, MD. I reached out to Roanna Raider by phone today in response to a referral sent by Mr. Marz Janice Toulouse's PCP, Alycia Rossetti, MD.   Mr. State was given information about Chronic Care Management services today including:  1. CCM service includes personalized support from designated clinical staff supervised by his physician, including individualized plan of care and coordination with other care providers 2. 24/7 contact phone numbers for assistance for urgent and routine care needs. 3. Service will only be billed when office clinical staff spend 20 minutes or more in a month to coordinate care. 4. Only one practitioner may furnish and bill the service in a calendar month. 5. The patient may stop CCM services at any time (effective at the end of the month) by phone call to the office staff.   Patient agreed to services and verbal consent obtained.   Follow up plan:   [place]

## 2020-01-26 ENCOUNTER — Other Ambulatory Visit: Payer: Self-pay | Admitting: *Deleted

## 2020-01-26 DIAGNOSIS — R972 Elevated prostate specific antigen [PSA]: Secondary | ICD-10-CM

## 2020-01-26 DIAGNOSIS — Z8546 Personal history of malignant neoplasm of prostate: Secondary | ICD-10-CM

## 2020-01-26 DIAGNOSIS — R319 Hematuria, unspecified: Secondary | ICD-10-CM

## 2020-01-29 DIAGNOSIS — Z8546 Personal history of malignant neoplasm of prostate: Secondary | ICD-10-CM | POA: Diagnosis not present

## 2020-01-29 DIAGNOSIS — R310 Gross hematuria: Secondary | ICD-10-CM | POA: Diagnosis not present

## 2020-02-03 ENCOUNTER — Other Ambulatory Visit: Payer: Self-pay | Admitting: Family Medicine

## 2020-02-03 NOTE — Telephone Encounter (Signed)
Ok to refill??  Last office visit 01/22/2020.  Last refill 12/08/2019, #2 refills.

## 2020-02-04 DIAGNOSIS — N200 Calculus of kidney: Secondary | ICD-10-CM | POA: Diagnosis not present

## 2020-02-04 DIAGNOSIS — K8020 Calculus of gallbladder without cholecystitis without obstruction: Secondary | ICD-10-CM | POA: Diagnosis not present

## 2020-02-04 DIAGNOSIS — R310 Gross hematuria: Secondary | ICD-10-CM | POA: Diagnosis not present

## 2020-02-09 ENCOUNTER — Other Ambulatory Visit: Payer: Self-pay | Admitting: Family Medicine

## 2020-02-09 NOTE — Telephone Encounter (Signed)
Last office visit: 01/22/2020 Last refilled: 11/13/2019

## 2020-02-16 ENCOUNTER — Other Ambulatory Visit: Payer: Self-pay

## 2020-02-16 ENCOUNTER — Inpatient Hospital Stay (HOSPITAL_COMMUNITY)
Admission: EM | Admit: 2020-02-16 | Discharge: 2020-02-19 | DRG: 190 | Disposition: A | Discharge status: Home Health | Payer: PPO | Source: Ambulatory Visit | Attending: Family Medicine | Admitting: Family Medicine

## 2020-02-16 ENCOUNTER — Ambulatory Visit (INDEPENDENT_AMBULATORY_CARE_PROVIDER_SITE_OTHER): Payer: PPO | Admitting: Family Medicine

## 2020-02-16 ENCOUNTER — Telehealth: Payer: Self-pay | Admitting: *Deleted

## 2020-02-16 ENCOUNTER — Emergency Department (HOSPITAL_COMMUNITY): Payer: PPO

## 2020-02-16 ENCOUNTER — Encounter (HOSPITAL_COMMUNITY): Payer: Self-pay

## 2020-02-16 ENCOUNTER — Encounter: Payer: Self-pay | Admitting: Family Medicine

## 2020-02-16 VITALS — BP 98/52 | HR 114 | Temp 99.2°F | Resp 22

## 2020-02-16 DIAGNOSIS — Z20822 Contact with and (suspected) exposure to covid-19: Secondary | ICD-10-CM | POA: Diagnosis present

## 2020-02-16 DIAGNOSIS — R6520 Severe sepsis without septic shock: Secondary | ICD-10-CM | POA: Diagnosis not present

## 2020-02-16 DIAGNOSIS — J9621 Acute and chronic respiratory failure with hypoxia: Secondary | ICD-10-CM

## 2020-02-16 DIAGNOSIS — A419 Sepsis, unspecified organism: Secondary | ICD-10-CM | POA: Diagnosis not present

## 2020-02-16 DIAGNOSIS — Z87891 Personal history of nicotine dependence: Secondary | ICD-10-CM | POA: Diagnosis not present

## 2020-02-16 DIAGNOSIS — R809 Proteinuria, unspecified: Secondary | ICD-10-CM | POA: Diagnosis present

## 2020-02-16 DIAGNOSIS — R3129 Other microscopic hematuria: Secondary | ICD-10-CM | POA: Diagnosis not present

## 2020-02-16 DIAGNOSIS — Z66 Do not resuscitate: Secondary | ICD-10-CM | POA: Diagnosis present

## 2020-02-16 DIAGNOSIS — E860 Dehydration: Secondary | ICD-10-CM | POA: Diagnosis not present

## 2020-02-16 DIAGNOSIS — J9601 Acute respiratory failure with hypoxia: Secondary | ICD-10-CM

## 2020-02-16 DIAGNOSIS — Z8042 Family history of malignant neoplasm of prostate: Secondary | ICD-10-CM | POA: Diagnosis not present

## 2020-02-16 DIAGNOSIS — N400 Enlarged prostate without lower urinary tract symptoms: Secondary | ICD-10-CM | POA: Diagnosis not present

## 2020-02-16 DIAGNOSIS — T380X5A Adverse effect of glucocorticoids and synthetic analogues, initial encounter: Secondary | ICD-10-CM | POA: Diagnosis present

## 2020-02-16 DIAGNOSIS — R000 Tachycardia, unspecified: Secondary | ICD-10-CM | POA: Diagnosis present

## 2020-02-16 DIAGNOSIS — Z8249 Family history of ischemic heart disease and other diseases of the circulatory system: Secondary | ICD-10-CM

## 2020-02-16 DIAGNOSIS — J439 Emphysema, unspecified: Principal | ICD-10-CM | POA: Diagnosis present

## 2020-02-16 DIAGNOSIS — R509 Fever, unspecified: Secondary | ICD-10-CM | POA: Diagnosis not present

## 2020-02-16 DIAGNOSIS — F419 Anxiety disorder, unspecified: Secondary | ICD-10-CM | POA: Diagnosis present

## 2020-02-16 DIAGNOSIS — I959 Hypotension, unspecified: Secondary | ICD-10-CM | POA: Diagnosis not present

## 2020-02-16 DIAGNOSIS — Z8546 Personal history of malignant neoplasm of prostate: Secondary | ICD-10-CM

## 2020-02-16 DIAGNOSIS — E872 Acidosis: Secondary | ICD-10-CM | POA: Diagnosis present

## 2020-02-16 DIAGNOSIS — R54 Age-related physical debility: Secondary | ICD-10-CM | POA: Diagnosis present

## 2020-02-16 DIAGNOSIS — R0602 Shortness of breath: Secondary | ICD-10-CM | POA: Diagnosis present

## 2020-02-16 DIAGNOSIS — D649 Anemia, unspecified: Secondary | ICD-10-CM | POA: Diagnosis not present

## 2020-02-16 DIAGNOSIS — I77810 Thoracic aortic ectasia: Secondary | ICD-10-CM | POA: Diagnosis present

## 2020-02-16 DIAGNOSIS — R531 Weakness: Secondary | ICD-10-CM | POA: Diagnosis not present

## 2020-02-16 DIAGNOSIS — E039 Hypothyroidism, unspecified: Secondary | ICD-10-CM | POA: Diagnosis present

## 2020-02-16 DIAGNOSIS — I340 Nonrheumatic mitral (valve) insufficiency: Secondary | ICD-10-CM | POA: Diagnosis not present

## 2020-02-16 DIAGNOSIS — R0902 Hypoxemia: Secondary | ICD-10-CM | POA: Diagnosis not present

## 2020-02-16 DIAGNOSIS — R0689 Other abnormalities of breathing: Secondary | ICD-10-CM | POA: Diagnosis not present

## 2020-02-16 DIAGNOSIS — K219 Gastro-esophageal reflux disease without esophagitis: Secondary | ICD-10-CM | POA: Diagnosis not present

## 2020-02-16 DIAGNOSIS — J441 Chronic obstructive pulmonary disease with (acute) exacerbation: Secondary | ICD-10-CM

## 2020-02-16 LAB — COMPREHENSIVE METABOLIC PANEL
ALT: 20 U/L (ref 0–44)
AST: 24 U/L (ref 15–41)
Albumin: 3.7 g/dL (ref 3.5–5.0)
Alkaline Phosphatase: 46 U/L (ref 38–126)
Anion gap: 12 (ref 5–15)
BUN: 17 mg/dL (ref 8–23)
CO2: 27 mmol/L (ref 22–32)
Calcium: 9 mg/dL (ref 8.9–10.3)
Chloride: 100 mmol/L (ref 98–111)
Creatinine, Ser: 0.97 mg/dL (ref 0.61–1.24)
GFR calc Af Amer: >60 mL/min (ref >60)
GFR calc non Af Amer: >60 mL/min (ref >60)
Glucose, Bld: 118 mg/dL — ABNORMAL HIGH (ref 70–99)
Potassium: 3.7 mmol/L (ref 3.5–5.1)
Sodium: 139 mmol/L (ref 135–145)
Total Bilirubin: 0.8 mg/dL (ref 0.3–1.2)
Total Protein: 6.4 g/dL — ABNORMAL LOW (ref 6.5–8.1)

## 2020-02-16 LAB — CBC WITH DIFFERENTIAL/PLATELET
Abs Immature Granulocytes: 0.06 10*3/uL (ref 0.00–0.07)
Basophils Absolute: 0 10*3/uL (ref 0.0–0.1)
Basophils Relative: 0 %
Eosinophils Absolute: 0 10*3/uL (ref 0.0–0.5)
Eosinophils Relative: 0 %
HCT: 44.3 % (ref 39.0–52.0)
Hemoglobin: 14.1 g/dL (ref 13.0–17.0)
Immature Granulocytes: 1 %
Lymphocytes Relative: 2 %
Lymphs Abs: 0.2 10*3/uL — ABNORMAL LOW (ref 0.7–4.0)
MCH: 30.1 pg (ref 26.0–34.0)
MCHC: 31.8 g/dL (ref 30.0–36.0)
MCV: 94.5 fL (ref 80.0–100.0)
Monocytes Absolute: 0.6 10*3/uL (ref 0.1–1.0)
Monocytes Relative: 5 %
Neutro Abs: 10.6 10*3/uL — ABNORMAL HIGH (ref 1.7–7.7)
Neutrophils Relative %: 92 %
Platelets: 320 10*3/uL (ref 150–400)
RBC: 4.69 MIL/uL (ref 4.22–5.81)
RDW: 13.8 % (ref 11.5–15.5)
WBC: 11.5 10*3/uL — ABNORMAL HIGH (ref 4.0–10.5)
nRBC: 0 % (ref 0.0–0.2)

## 2020-02-16 LAB — SARS CORONAVIRUS 2 BY RT PCR (HOSPITAL ORDER, PERFORMED IN ~~LOC~~ HOSPITAL LAB): SARS Coronavirus 2: NEGATIVE

## 2020-02-16 LAB — LACTIC ACID, PLASMA
Lactic Acid, Venous: 1.3 mmol/L (ref 0.5–1.9)
Lactic Acid, Venous: 2.3 mmol/L (ref 0.5–1.9)

## 2020-02-16 LAB — BRAIN NATRIURETIC PEPTIDE: B Natriuretic Peptide: 72.2 pg/mL (ref 0.0–100.0)

## 2020-02-16 LAB — D-DIMER, QUANTITATIVE: D-Dimer, Quant: 1.33 ug/mL-FEU — ABNORMAL HIGH (ref 0.00–0.50)

## 2020-02-16 LAB — TSH: TSH: 1.104 u[IU]/mL (ref 0.350–4.500)

## 2020-02-16 IMAGING — DX DG CHEST 2V
2 series · 2 of 2 positions shown · non-contrast
Comparison: Chest radiograph dated [DATE]. chest CT dated
[DATE].

CLINICAL DATA: 89-year-old male with fever.

EXAM:
CHEST - 2 VIEW

[chest lat]
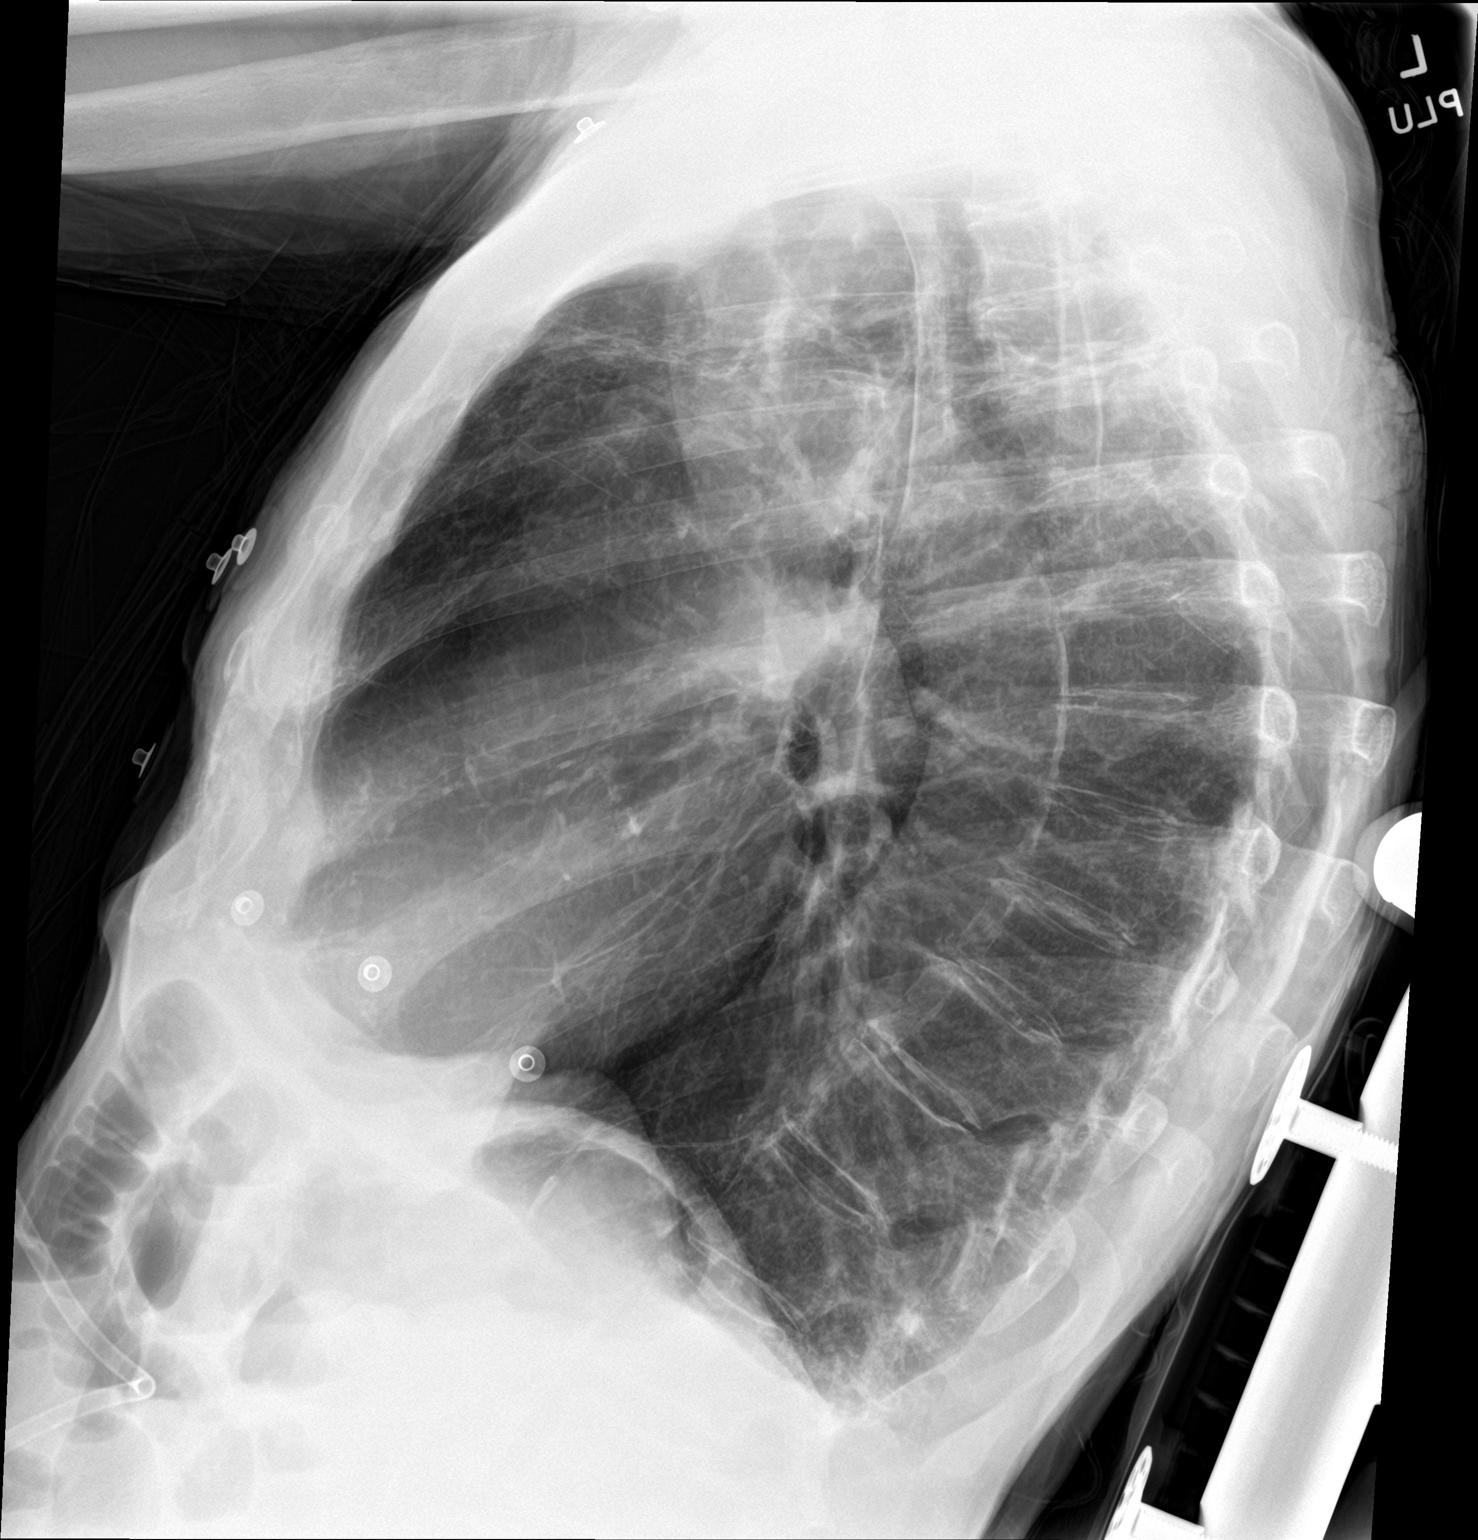

[chest ap]
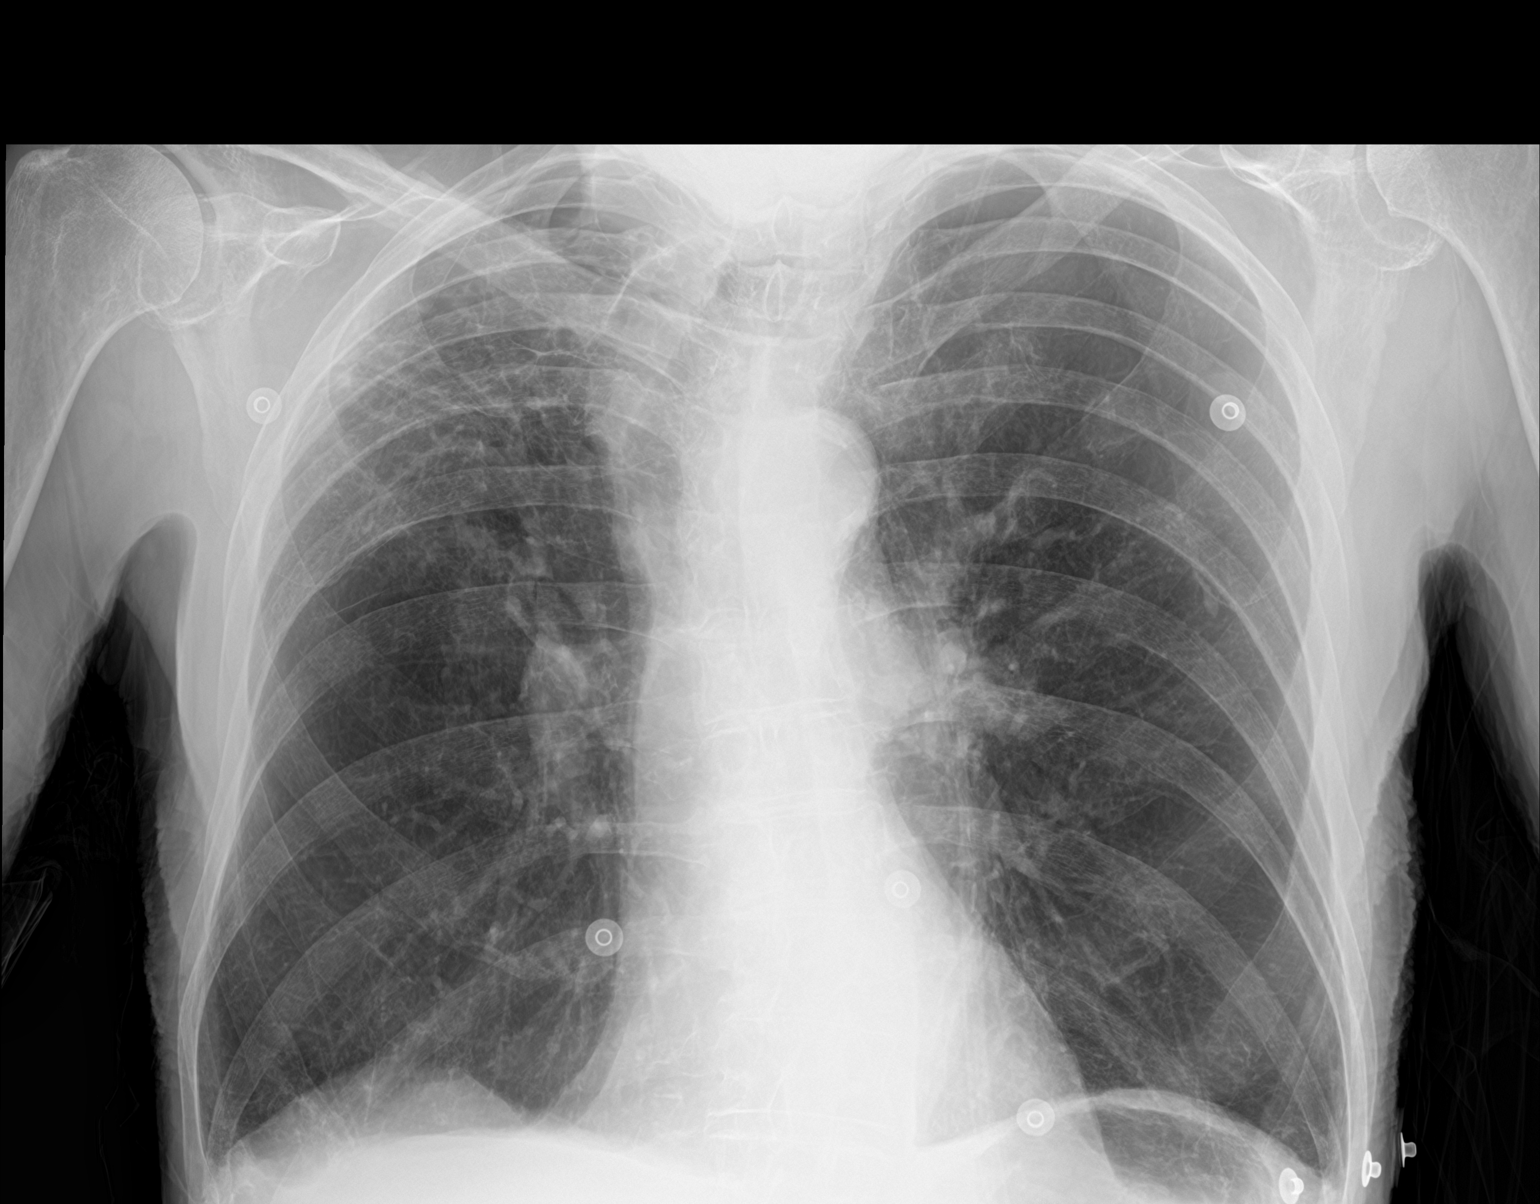

[2 of 2 positions shown; findings below may reference images not displayed]

FINDINGS: There is background of emphysema. Right upper lobe scarring with
architectural distortion and traction. No consolidative changes.
There is slight blunting of the right costophrenic angle which may
be chronic and secondary to scarring. Trace right pleural effusion
is not excluded. There is no pneumothorax. Atherosclerotic
calcification of the aorta. The cardiac silhouette is within normal
limits. Osteopenia. No acute osseous pathology.
IMPRESSION: 1. No acute cardiopulmonary process.
2. Emphysema.
3. Right upper lobe scarring.

## 2020-02-16 MED ORDER — SODIUM CHLORIDE 0.9 % IV SOLN
1.0000 g | INTRAVENOUS | Status: DC
  Administered 2020-02-17 – 2020-02-18 (×2): 1 g via INTRAVENOUS
  Filled 2020-02-16: qty 10
  Filled 2020-02-16 (×2): qty 1

## 2020-02-16 MED ORDER — ALBUTEROL SULFATE HFA 108 (90 BASE) MCG/ACT IN AERS
8.0000 | INHALATION_SPRAY | Freq: Once | RESPIRATORY_TRACT | Status: AC
  Administered 2020-02-16: 8 via RESPIRATORY_TRACT
  Filled 2020-02-16: qty 6.7

## 2020-02-16 MED ORDER — PANTOPRAZOLE SODIUM 40 MG PO TBEC
40.0000 mg | DELAYED_RELEASE_TABLET | Freq: Every day | ORAL | Status: DC
  Administered 2020-02-17 – 2020-02-19 (×3): 40 mg via ORAL
  Filled 2020-02-16 (×3): qty 1

## 2020-02-16 MED ORDER — METHYLPREDNISOLONE SODIUM SUCC 125 MG IJ SOLR
125.0000 mg | Freq: Once | INTRAMUSCULAR | Status: AC
  Administered 2020-02-16: 125 mg via INTRAVENOUS
  Filled 2020-02-16: qty 2

## 2020-02-16 MED ORDER — BUDESONIDE 0.25 MG/2ML IN SUSP
0.2500 mg | Freq: Two times a day (BID) | RESPIRATORY_TRACT | Status: DC
  Administered 2020-02-17 – 2020-02-19 (×5): 0.25 mg via RESPIRATORY_TRACT
  Filled 2020-02-16 (×7): qty 2

## 2020-02-16 MED ORDER — TAMSULOSIN HCL 0.4 MG PO CAPS
0.4000 mg | ORAL_CAPSULE | Freq: Every day | ORAL | Status: DC
  Administered 2020-02-17 – 2020-02-19 (×3): 0.4 mg via ORAL
  Filled 2020-02-16 (×3): qty 1

## 2020-02-16 MED ORDER — THEOPHYLLINE ER 400 MG PO TB24
400.0000 mg | ORAL_TABLET | Freq: Every day | ORAL | Status: DC
  Administered 2020-02-17 – 2020-02-19 (×3): 400 mg via ORAL
  Filled 2020-02-16 (×3): qty 1

## 2020-02-16 MED ORDER — SODIUM CHLORIDE 0.9% FLUSH
3.0000 mL | Freq: Once | INTRAVENOUS | Status: AC
  Administered 2020-02-16: 3 mL via INTRAVENOUS

## 2020-02-16 MED ORDER — ALBUTEROL SULFATE (2.5 MG/3ML) 0.083% IN NEBU
2.5000 mg | INHALATION_SOLUTION | RESPIRATORY_TRACT | Status: DC | PRN
  Administered 2020-02-17: 2.5 mg via RESPIRATORY_TRACT
  Filled 2020-02-16: qty 3

## 2020-02-16 MED ORDER — LEVOTHYROXINE SODIUM 75 MCG PO TABS
75.0000 ug | ORAL_TABLET | Freq: Every day | ORAL | Status: DC
  Administered 2020-02-17 – 2020-02-19 (×3): 75 ug via ORAL
  Filled 2020-02-16 (×3): qty 1

## 2020-02-16 MED ORDER — AZITHROMYCIN 250 MG PO TABS
500.0000 mg | ORAL_TABLET | Freq: Once | ORAL | Status: AC
  Administered 2020-02-16: 500 mg via ORAL
  Filled 2020-02-16: qty 2

## 2020-02-16 MED ORDER — IPRATROPIUM BROMIDE HFA 17 MCG/ACT IN AERS
2.0000 | INHALATION_SPRAY | Freq: Once | RESPIRATORY_TRACT | Status: AC
  Administered 2020-02-16: 2 via RESPIRATORY_TRACT
  Filled 2020-02-16: qty 12.9

## 2020-02-16 MED ORDER — ACETAMINOPHEN 650 MG RE SUPP: 650.0000 mg | Freq: Four times a day (QID) | RECTAL | Status: DC | PRN

## 2020-02-16 MED ORDER — ACETAMINOPHEN 325 MG PO TABS: 650.0000 mg | ORAL_TABLET | Freq: Four times a day (QID) | ORAL | Status: DC | PRN

## 2020-02-16 MED ORDER — LORAZEPAM 0.5 MG PO TABS
0.5000 mg | ORAL_TABLET | Freq: Two times a day (BID) | ORAL | Status: DC | PRN
  Administered 2020-02-17 – 2020-02-19 (×5): 0.5 mg via ORAL
  Filled 2020-02-16 (×5): qty 1

## 2020-02-16 MED ORDER — ALBUTEROL (5 MG/ML) CONTINUOUS INHALATION SOLN
5.0000 mg/h | INHALATION_SOLUTION | Freq: Once | RESPIRATORY_TRACT | Status: AC
  Administered 2020-02-16: 15 mg/h via RESPIRATORY_TRACT
  Filled 2020-02-16: qty 20

## 2020-02-16 MED ORDER — LACTATED RINGERS IV BOLUS
1000.0000 mL | Freq: Once | INTRAVENOUS | Status: AC
  Administered 2020-02-16: 1000 mL via INTRAVENOUS

## 2020-02-16 MED ORDER — GUAIFENESIN ER 600 MG PO TB12
600.0000 mg | ORAL_TABLET | Freq: Two times a day (BID) | ORAL | Status: DC
  Administered 2020-02-17 – 2020-02-18 (×4): 600 mg via ORAL
  Filled 2020-02-16 (×6): qty 1

## 2020-02-16 MED ORDER — SODIUM CHLORIDE 0.9 % IV SOLN
500.0000 mg | INTRAVENOUS | Status: DC
  Administered 2020-02-17: 500 mg via INTRAVENOUS
  Filled 2020-02-16 (×2): qty 500

## 2020-02-16 MED ORDER — ENOXAPARIN SODIUM 40 MG/0.4ML ~~LOC~~ SOLN
40.0000 mg | SUBCUTANEOUS | Status: DC
  Administered 2020-02-17: 40 mg via SUBCUTANEOUS
  Filled 2020-02-16 (×2): qty 0.4

## 2020-02-16 MED ORDER — IPRATROPIUM-ALBUTEROL 0.5-2.5 (3) MG/3ML IN SOLN
3.0000 mL | Freq: Four times a day (QID) | RESPIRATORY_TRACT | Status: DC
  Administered 2020-02-17 – 2020-02-18 (×5): 3 mL via RESPIRATORY_TRACT
  Filled 2020-02-16 (×5): qty 3

## 2020-02-16 MED ORDER — METHYLPREDNISOLONE SODIUM SUCC 125 MG IJ SOLR
60.0000 mg | Freq: Four times a day (QID) | INTRAMUSCULAR | Status: AC
  Administered 2020-02-17 (×4): 60 mg via INTRAVENOUS
  Filled 2020-02-16 (×4): qty 2

## 2020-02-16 MED ORDER — SODIUM CHLORIDE 0.9 % IV SOLN
1.0000 g | Freq: Once | INTRAVENOUS | Status: AC
  Administered 2020-02-16: 1 g via INTRAVENOUS
  Filled 2020-02-16: qty 10

## 2020-02-16 NOTE — ED Provider Notes (Signed)
[facility] Provider Note   CSN: [id number] Arrival date & time: 02/16/20  1409     History No chief complaint on file.   John Black is a 86 y.o. male.  Patient is an 86 year old male with a history of COPD on multiple medications at home oxygen who presented to his primary care doctor today with significant concern for shortness of breath, tachycardia, fever of 100.4 was brought to ED by EMS for further treatment evaluation.  On arrival ABCs intact.  Patient is tachypneic and tachycardic but afebrile.  Patient is normally on 3 L of nasal cannula, increased to 5 L to maintain oxygen saturation in low 90s.  The history is provided by the patient, a relative, the EMS personnel and medical records.  Illness Location:  Respiratory Quality:  Shortness of breath Severity:  Moderate Onset quality:  Gradual Timing:  Constant Progression:  Worsening Chronicity:  Chronic Context:  Patient has known COPD but has had some infectious symptoms recently Relieved by:  Increasing oxygen, nebulizer treatments Worsened by:  Exertion Ineffective treatments:  Nothing Associated symptoms: cough, fatigue, fever and shortness of breath   Associated symptoms: no abdominal pain, no chest pain, no congestion, no nausea and no vomiting        Past Medical History:  Diagnosis Date  . Allergic rhinitis   . Cataract    s/p removal  . Chronic respiratory failure (HCC)    oxygen 3L at home  . Emphysema   . Hypothyroid   . PNA (pneumonia)   . Prostate cancer [facility])     Patient Active Problem List   Diagnosis Date Noted  . SOB (shortness of breath) 02/17/2020  . Acute on chronic respiratory failure with hypoxia ([place]) 02/16/2020  . Sepsis ([place]) 02/16/2020  . Dysphagia 07/24/2019  . Protein calorie malnutrition ([place]) 02/24/2019  . Gait instability 01/27/2019  . Right inguinal hernia   . Chronic diarrhea 12/06/2017  . Inguinal hernia 03/02/2014  .  Constipation 03/02/2014  . Chronic respiratory failure ([place]) 12/09/2013  . Hemorrhoids 09/28/2013  . Mild hyperlipidemia 06/28/2013  . Sinusitis, chronic 11/28/2012  . COPD with acute exacerbation ([place]) 11/10/2012  . BPH (benign prostatic hyperplasia) 10/22/2012  . Insomnia 10/22/2012  . Pneumonia 09/05/2012  . Hypothyroidism 06/19/2012  . Atypical nevi 06/19/2012  . Seborrheic keratosis 06/19/2012  . History of prostate cancer 11/07/2010  . Allergic rhinitis 11/07/2010  . COPD (chronic obstructive pulmonary disease) with emphysema ([employer]) 11/07/2010    Past Surgical History:  Procedure Laterality Date  . ADENOIDECTOMY    . ROTATOR CUFF REPAIR  1990,2009   bilateral  . seed implant for prostate cancer  2000  . TONSILLECTOMY    . TRANSURETHRAL RESECTION OF PROSTATE  2001   x2       Family History  Problem Relation Age of Onset  . Heart disease Mother   . Prostate cancer Father     Social History   Tobacco Use  . Smoking status: Former Smoker    Packs/day: 1.50    Years: 50.00    Pack years: 75.00    Types: Cigarettes    Quit date: 09/17/2008    Years since quitting: 11.4  . Smokeless tobacco: Never Used  Substance Use Topics  . Alcohol use: Yes    Alcohol/week: 1.0 standard drinks    Types: 1 Glasses of wine per week    Comment: each evening  . Drug use: No    Home Medications Prior  to Admission medications   Medication Sig Start Date End Date Taking? Authorizing Provider  acetaminophen (TYLENOL) 325 MG tablet Take 650 mg by mouth every 6 (six) hours as needed for mild pain.   Yes [provider]  albuterol (PROVENTIL) (2.5 MG/3ML) 0.083% nebulizer solution Take 3 mLs (2.5 mg total) by nebulization every 6 (six) hours as needed for wheezing or shortness of breath. 02/24/19  Yes Marissa, Modena Nunnery, MD  albuterol (VENTOLIN HFA) 108 (90 Base) MCG/ACT inhaler Inhale 2 puffs into the lungs every 6 (six) hours as needed for wheezing or shortness of breath.  03/09/19  Yes Byrum, Rose Fillers, MD  calcium gluconate 500 MG tablet Take 1 tablet by mouth 3 (three) times daily.   Yes [provider]  Fluticasone-Umeclidin-Vilant (TRELEGY ELLIPTA) 100-62.5-25 MCG/INH AEPB Inhale 1 puff into the lungs daily. 08/31/19  Yes Collene Gobble, MD  guaiFENesin (MUCINEX) 600 MG 12 hr tablet Take 600 mg by mouth 2 (two) times daily.   Yes [provider]  levothyroxine (SYNTHROID) 75 MCG tablet TAKE 1 TABLET (75 MCG TOTAL) BY MOUTH DAILY. Patient taking differently: Take 75 mcg by mouth daily.  04/09/19  Yes [place], Modena Nunnery, MD  LORazepam (ATIVAN) 0.5 MG tablet TAKE 1 TABLET (0.5 MG TOTAL) BY MOUTH 2 (TWO) TIMES DAILY AS NEEDED. Patient taking differently: Take 0.5 mg by mouth 2 (two) times daily as needed for anxiety or sleep.  02/03/20  Yes [place], Modena Nunnery, MD  pantoprazole (PROTONIX) 40 MG tablet TAKE 1 TABLET BY MOUTH EVERY DAY Patient taking differently: Take 40 mg by mouth daily.  09/22/19  Yes Collene Gobble, MD  predniSONE (DELTASONE) 10 MG tablet TAKE 1 TABLET (10 MG TOTAL) BY MOUTH DAILY WITH BREAKFAST. Patient taking differently: Take 10 mg by mouth daily with breakfast.  11/09/19  Yes Byrum, Rose Fillers, MD  Respiratory Therapy Supplies (FLUTTER) DEVI Use as directed. Patient taking differently: 1 each by Other route as directed.  08/12/19  Yes Parrett, Tammy S, NP  tamsulosin (FLOMAX) 0.4 MG CAPS capsule Take 1 capsule (0.4 mg total) by mouth daily. 06/02/19  Yes [place], Modena Nunnery, MD  theophylline (UNIPHYL) 400 MG 24 hr tablet TAKE 1 TABLET BY MOUTH EVERY DAY Patient taking differently: Take 400 mg by mouth daily.  11/09/19  Yes Collene Gobble, MD  traMADol (ULTRAM) 50 MG tablet Take 1 tablet (50 mg total) by mouth every 8 (eight) hours as needed. for pain Patient taking differently: Take 50 mg by mouth every 8 (eight) hours as needed for moderate pain.  07/24/19  Yes [place], Modena Nunnery, MD  zolpidem (AMBIEN) 5 MG tablet TAKE 1 TABLET (5 MG  TOTAL) BY MOUTH AT BEDTIME AS NEEDED FOR SLEEP. 02/09/20  Yes [place], Modena Nunnery, MD    Allergies    Morphine  Review of Systems   Review of Systems  Constitutional: Positive for fatigue and fever.  HENT: Negative for congestion.   Respiratory: Positive for cough and shortness of breath.   Cardiovascular: Negative for chest pain.  Gastrointestinal: Negative for abdominal pain, nausea and vomiting.  All other systems reviewed and are negative.   Physical Exam Updated Vital Signs BP 106/62   Pulse 94   Temp 98.5 F (36.9 C) (Oral)   Resp 16   Ht 5\' 4"  (1.626 m)   Wt 52.2 kg   SpO2 93%   BMI 19.74 kg/m   Physical Exam Vitals and nursing note reviewed.  Constitutional:  General: He is not in acute distress.    Appearance: He is well-developed.  HENT:     Head: Normocephalic and atraumatic.     Mouth/Throat:     Mouth: Mucous membranes are moist.  Eyes:     Extraocular Movements: Extraocular movements intact.     Conjunctiva/sclera: Conjunctivae normal.  Cardiovascular:     Rate and Rhythm: Regular rhythm. Tachycardia present.     Heart sounds: No murmur.  Pulmonary:     Effort: Pulmonary effort is normal. Tachypnea present. No respiratory distress.     Breath sounds: Rhonchi present.  Abdominal:     Palpations: Abdomen is soft.     Tenderness: There is no abdominal tenderness.  Musculoskeletal:     Cervical back: Neck supple.     Right lower leg: No tenderness. No edema.     Left lower leg: No tenderness. No edema.  Skin:    General: Skin is warm and dry.  Neurological:     General: No focal deficit present.     Mental Status: He is alert and oriented to person, place, and time.  Psychiatric:        Mood and Affect: Mood normal.        Behavior: Behavior normal.     ED Results / Procedures / Treatments   Labs (all labs ordered are listed, but only abnormal results are displayed) Labs Reviewed  LACTIC ACID, PLASMA - Abnormal; Notable for the following  components:      Result Value   Lactic Acid, Venous 2.3 (*)    All other components within normal limits  COMPREHENSIVE METABOLIC PANEL - Abnormal; Notable for the following components:   Glucose, Bld 118 (*)    Total Protein 6.4 (*)    All other components within normal limits  CBC WITH DIFFERENTIAL/PLATELET - Abnormal; Notable for the following components:   WBC 11.5 (*)    Neutro Abs 10.6 (*)    Lymphs Abs 0.2 (*)    All other components within normal limits  THEOPHYLLINE LEVEL - Abnormal; Notable for the following components:   Theophylline Lvl 3.6 (*)    All other components within normal limits  CBC - Abnormal; Notable for the following components:   RBC 4.11 (*)    Hemoglobin 12.4 (*)    HCT 38.7 (*)    All other components within normal limits  URINALYSIS, ROUTINE W REFLEX MICROSCOPIC - Abnormal; Notable for the following components:   Specific Gravity, Urine >1.046 (*)    Glucose, UA >=500 (*)    Hgb urine dipstick SMALL (*)    Ketones, ur 5 (*)    Protein, ur 30 (*)    Bacteria, UA RARE (*)    All other components within normal limits  LACTIC ACID, PLASMA - Abnormal; Notable for the following components:   Lactic Acid, Venous 7.4 (*)    All other components within normal limits  GLUCOSE, CAPILLARY - Abnormal; Notable for the following components:   Glucose-Capillary 218 (*)    All other components within normal limits  LACTIC ACID, PLASMA - Abnormal; Notable for the following components:   Lactic Acid, Venous 3.2 (*)    All other components within normal limits  SARS CORONAVIRUS 2 BY RT PCR (HOSPITAL ORDER, [place])  BRAIN NATRIURETIC PEPTIDE  BLOOD GAS, ARTERIAL  I-STAT VENOUS BLOOD GAS, ED  I-STAT ARTERIAL BLOOD GAS, ED  POC SARS CORONAVIRUS 2 AG -  ED    EKG None  Radiology DG Chest 2 View  Result Date: 02/16/2020 CLINICAL DATA:  86 year old male with fever. EXAM: CHEST - 2 VIEW COMPARISON:  Chest radiograph dated  09/23/2019. chest CT dated 10/08/2019. FINDINGS: There is background of emphysema. Right upper lobe scarring with architectural distortion and traction. No consolidative changes. There is slight blunting of the right costophrenic angle which may be chronic and secondary to scarring. Trace right pleural effusion is not excluded. There is no pneumothorax. Atherosclerotic calcification of the aorta. The cardiac silhouette is within normal limits. Osteopenia. No acute osseous pathology. IMPRESSION: 1. No acute cardiopulmonary process. 2. Emphysema. 3. Right upper lobe scarring. Electronically Signed   By: Anner Crete M.D.   On: 02/16/2020 16:10   Procedures Procedures (including critical care time)  Medications Ordered in ED Medications  azithromycin (ZITHROMAX) 500 mg in sodium chloride 0.9 % 250 mL IVPB (has no administration in time range)  cefTRIAXone (ROCEPHIN) 1 g in sodium chloride 0.9 % 100 mL IVPB (has no administration in time range)  methylPREDNISolone sodium succinate (SOLU-MEDROL) 125 mg/2 mL injection 60 mg (60 mg Intravenous Given 02/17/20 1002)  LORazepam (ATIVAN) tablet 0.5 mg (0.5 mg Oral Given 02/17/20 0350)  levothyroxine (SYNTHROID) tablet 75 mcg (75 mcg Oral Given 02/17/20 0558)  pantoprazole (PROTONIX) EC tablet 40 mg (40 mg Oral Given 02/17/20 1001)  tamsulosin (FLOMAX) capsule 0.4 mg (0.4 mg Oral Given 02/17/20 1001)  guaiFENesin (MUCINEX) 12 hr tablet 600 mg (600 mg Oral Given 02/17/20 1001)  theophylline (UNIPHYL) 400 MG 24 hr tablet 400 mg (400 mg Oral Given 02/17/20 1214)  enoxaparin (LOVENOX) injection 40 mg (40 mg Subcutaneous Given 02/17/20 0128)  acetaminophen (TYLENOL) tablet 650 mg (has no administration in time range)    Or  acetaminophen (TYLENOL) suppository 650 mg (has no administration in time range)  ipratropium-albuterol (DUONEB) 0.5-2.5 (3) MG/3ML nebulizer solution 3 mL (3 mLs Nebulization Given 02/17/20 0806)  albuterol (PROVENTIL) (2.5 MG/3ML) 0.083% nebulizer  solution 2.5 mg (2.5 mg Nebulization Given 02/17/20 0401)  budesonide (PULMICORT) nebulizer solution 0.25 mg (0.25 mg Nebulization Given 02/17/20 0810)  Chlorhexidine Gluconate Cloth 2 % PADS 6 each (6 each Topical Given 02/17/20 1214)  0.9 %  sodium chloride infusion (has no administration in time range)  sodium chloride flush (NS) 0.9 % injection 3 mL (3 mLs Intravenous Given 02/16/20 1839)  albuterol (VENTOLIN HFA) 108 (90 Base) MCG/ACT inhaler 8 puff (8 puffs Inhalation Given 02/16/20 1838)  ipratropium (ATROVENT HFA) inhaler 2 puff (2 puffs Inhalation Given 02/16/20 1838)  methylPREDNISolone sodium succinate (SOLU-MEDROL) 125 mg/2 mL injection 125 mg (125 mg Intravenous Given 02/16/20 1838)  lactated ringers bolus 1,000 mL (1,000 mLs Intravenous New Bag/Given 02/16/20 2307)  cefTRIAXone (ROCEPHIN) 1 g in sodium chloride 0.9 % 100 mL IVPB (0 g Intravenous Stopped 02/16/20 2247)  azithromycin (ZITHROMAX) tablet 500 mg (500 mg Oral Given 02/16/20 2102)  albuterol (PROVENTIL,VENTOLIN) solution continuous neb (15 mg/hr Nebulization Given 02/16/20 2235)  iohexol (OMNIPAQUE) 350 MG/ML injection 75 mL (75 mLs Intravenous Contrast Given 02/17/20 0229)    ED Course  I have reviewed the triage vital signs and the nursing notes.  Pertinent labs & imaging results that were available during my care of the patient were reviewed by me and considered in my medical decision making (see chart for details).    MDM Rules/Calculators/A&P                      Differential diagnosis: COVID-19, viral upper restaurant infection, COPD exacerbation,  hypoxia, sequela of chronic lung disease  ED physician interpretation of imaging: Chest x-ray without focal pneumonia, volume overload, wide mediastinum, hemopneumothorax.  Chronic lung disease findings ED physician interpretation of EKG: No STEMI, EKG not get pulled over to epic.  Sinus tachycardia ED physician interpretation of labs: Patient has mild lactic acidosis that improved after  IV fluids.  Mild leukocytosis.  Theophylline levels not toxic but subtherapeutic  MDM: Patient is a 86 year old male with severe COPD presented to the ED from primary care for shortness of breath and concern for infectious symptoms with increased oxygen requirement requiring antibiotics for possible COPD exacerbation in hospital admission for through treatment and evaluation.  Patient's vital signs are stable, patient is afebrile in the ED.  Patient is tachycardic and tachypneic the symptoms improved with IV fluids, inhalers for COPD exacerbation.  Case discussed with hospitalist service, will admit to their service, D-dimer ordered to assist in evaluation of possible pulmonary embolism, results pending.  Diagnosis, treatment and plan of care was discussed and agreed upon with patient.  Patient comfortable with admission at this time.   Final Clinical Impression(s) / ED Diagnoses Final diagnoses:  SOB (shortness of breath)  Tachycardia  COPD exacerbation [facility])    Rx / DC Orders ED Discharge Orders    None       Delma Post, MD 02/17/20 [place], [place], DO 02/17/20 1523

## 2020-02-16 NOTE — Progress Notes (Signed)
Subjective:    Patient ID: John Black, male    DOB: Sep 25, 1928, 86 y.o.   [name]: [id number]  Patient presents for Illness (fatigue, bowel issues, SOB)  Patient here with his wife and his daughter.  The past few days he has had increased fatigue as of breath.  He is on oxygen at baseline typically 3 L but has increased to 4 L this morning when he was noted to be more hypoxic.  His O2 sat on 4 L is currently 92%.  He has had cough with congestion.  He has tested emesis this morning is mostly sputum.  He has had some diarrhea intermittently which is chronic. He is also had fever T-max 100.4 but spiked this morning.  He is not eating or drinking very well but still making some urine. He has been a little confused not as sharp as he typically is the past couple days as well.  No known sick contacts.  He is taking all his medications as prescribed.  Upon evaluation my nurse noted that his blood pressure was significantly low high systolically able to get was 100 over 50s.  His heart rate was jumping from 1 10-1 20s.  He sitting in wheelchair fatigue and well-appearing.  Temperature 99.2 temporally.  Currently satting at 90% on 4 L.  Daughter does state when he is at rest he appears more comfortable with his breathing but if he exerts himself to walk even a few feet he is significantly short of breath.     Review Of Systems:  GEN- + fatigue, + fever, weight loss,weakness, recent illness HEENT- denies eye drainage, change in vision, nasal discharge, CVS- denies chest pain, palpitations RESP- +SOB, cough, wheeze ABD- denies N/V, change in stools, abd pain GU- denies dysuria, hematuria, dribbling, incontinence MSK- denies joint pain, muscle aches, injury Neuro- denies headache, dizziness, syncope, seizure activity       Objective:    BP (!) 98/52 (BP Location: Left Arm, Patient Position: Sitting, Cuff Size: Normal)   Pulse (!) 114   Temp 99.2 F (37.3 C) (Temporal)   Resp (!) 22    SpO2 92% Comment: 4L/ min via [place] GEN- NAD, alert and oriented x3,sitting in wheelchair  HEENT- PERRL, EOMI, non injected sclera, pink conjunctiva, Dry MM oropharynx clear Neck- Supple, no  LAD  CVS- tachycardic, RR , distant HS, no murmur  RESP-decreased at bases, with congestion bilat, no wheeze  ABD-NABS,soft,NT,ND EXT- No edema Pulses- Radial  2+        Assessment & Plan:      Problem List Items Addressed This Visit    None    Visit Diagnoses    Sepsis with acute hypoxic respiratory failure without septic shock, due to unspecified organism ([place])    -  Primary   Concern for sepsis due to PNA, pt has acute respiratory failure, hypotension, tachycardia  dehydration,fever. Oxygen sat low.  .  He has had his COVID-19 vaccines.  No sick contacts.  Underlying COPD/emphysema  EMS called to transfer to hospital for further work up    Note he has not had hematuria    Dehydration  - per above    Acute on chronic respiratory failure with hypoxia (HCC)       Generalized weakness    -setting of above and his already known protein calorie malnutrition       Note: This dictation was prepared with Dragon dictation along with smaller phrase technology. Any transcriptional errors that result from  this process are unintentional.

## 2020-02-16 NOTE — Progress Notes (Signed)
Lab results not crossing over from iSTAT to Epic, ABG Resulted and are as follows. At Patients Temp- pH: 7.402 PCO2: 44.9 PO2: 85 Be,B: 3 HCO3: 27.9 TCO2: 29 sO2: 96% Na: 137 K: 3.8 iCA: 1.25 Hct: 40% Hb* via Hct: 13.6

## 2020-02-16 NOTE — ED Triage Notes (Signed)
Patient arrived by [facility] from MD office for increased SOB. Patient found to have weakness, low grade fever and lower sats. Alert and oriented, denies pain

## 2020-02-16 NOTE — Telephone Encounter (Signed)
Call placed to patient to triage. Spoke with patient wife Enid Derry.   States that patient has had x1 episode of loose stools. States that he has felt "off" for a few days. Reports that he is tolerating liquids, but has no appetite. States that he is coughing up some mucus.   Very vague symptoms reported by patient wife. Patient unable to discuss as he was currently taking nebulizer treatment.   MD please advise.

## 2020-02-16 NOTE — Progress Notes (Signed)
Upon evaluation of patient, ABG done on current settings of 5L [place] and Continuous Aerosol Albuterol on [employer]. No need at this time for BiPAP and patient stated he is at his baseline in breathing and how he feels. Attempting to contact MD at this time to explain situation and see where she would like to go from here, RN aware.

## 2020-02-16 NOTE — ED Notes (Signed)
Sustain HR > 112, MD Tyrone Nine notified and directed to give Albuterol tx.

## 2020-02-16 NOTE — Telephone Encounter (Signed)
Noted, okay to come into the office to be evaluated instead of ER

## 2020-02-16 NOTE — H&P (Signed)
History and Physical    John Black [id number] DOB: [birth year] DOA: 02/16/2020  PCP: Alycia Rossetti, MD Patient coming from: Home  Chief Complaint: Shortness of breath  HPI: John Black is a 86 y.o. male with medical history significant of COPD/emphysema, chronic respiratory failure on home oxygen, hypothyroidism presenting with complaints of shortness of breath.  Patient reports 3 to 4-day history of progressively worsening dyspnea.  He is also wheezing but not coughing much.  Denies chest pain.  Denies fevers or chills.  States he uses 3 to 4 L supplemental oxygen at home all the time.  No additional history could be obtained from the patient due to respiratory distress.  Daughter states patient has been vaccinated for Covid.  States he does not have a history of blood clots.  ED Course: Afebrile.  Persistently tachycardic with heart rate up to 110s.  Tachypneic.  Requiring 4 to 5 L supplemental oxygen.  WBC count mildly elevated at 11.5.  Lactic acid 1.3 >2.3.  TSH normal.  SARS-CoV-2 PCR test pending.  Chest x-ray showing emphysema, right upper lobe scarring, and no acute cardiopulmonary process.  Patient received albuterol, ipratropium, azithromycin, ceftriaxone, Solu-Medrol 125 mg, and 1 L LR bolus.  Review of Systems:  All systems reviewed and apart from history of presenting illness, are negative.  Past Medical History:  Diagnosis Date  . Allergic rhinitis   . Cataract    s/p removal  . Chronic respiratory failure (HCC)    oxygen 3L at home  . Emphysema   . Hypothyroid   . PNA (pneumonia)   . Prostate cancer [facility])     Past Surgical History:  Procedure Laterality Date  . ADENOIDECTOMY    . ROTATOR CUFF REPAIR  1990,2009   bilateral  . seed implant for prostate cancer  2000  . TONSILLECTOMY    . TRANSURETHRAL RESECTION OF PROSTATE  2001   x2     reports that he quit smoking about 11 years ago. His smoking use included cigarettes. He has a 75.00  pack-year smoking history. He has never used smokeless tobacco. He reports current alcohol use of about 1.0 standard drinks of alcohol per week. He reports that he does not use drugs.  Allergies  Allergen Reactions  . Morphine     REACTION: sweats    Family History  Problem Relation Age of Onset  . Heart disease Mother   . Prostate cancer Father     Prior to Admission medications   Medication Sig Start Date End Date Taking? Authorizing Provider  acetaminophen (TYLENOL) 325 MG tablet Take 650 mg by mouth every 6 (six) hours as needed for mild pain.   Yes [provider]  albuterol (PROVENTIL) (2.5 MG/3ML) 0.083% nebulizer solution Take 3 mLs (2.5 mg total) by nebulization every 6 (six) hours as needed for wheezing or shortness of breath. 02/24/19  Yes [place], Modena Nunnery, MD  albuterol (VENTOLIN HFA) 108 (90 Base) MCG/ACT inhaler Inhale 2 puffs into the lungs every 6 (six) hours as needed for wheezing or shortness of breath. 03/09/19  Yes [place], Rose Fillers, MD  calcium gluconate 500 MG tablet Take 1 tablet by mouth 3 (three) times daily.   Yes [provider]  Fluticasone-Umeclidin-Vilant (TRELEGY ELLIPTA) 100-62.5-25 MCG/INH AEPB Inhale 1 puff into the lungs daily. 08/31/19  Yes Collene Gobble, MD  guaiFENesin (MUCINEX) 600 MG 12 hr tablet Take 600 mg by mouth 2 (two) times daily.   Yes [provider]  levothyroxine (  SYNTHROID) 75 MCG tablet TAKE 1 TABLET (75 MCG TOTAL) BY MOUTH DAILY. Patient taking differently: Take 75 mcg by mouth daily.  04/09/19  Yes [place], Modena Nunnery, MD  LORazepam (ATIVAN) 0.5 MG tablet TAKE 1 TABLET (0.5 MG TOTAL) BY MOUTH 2 (TWO) TIMES DAILY AS NEEDED. Patient taking differently: Take 0.5 mg by mouth 2 (two) times daily as needed for anxiety or sleep.  02/03/20  Yes [place], Modena Nunnery, MD  pantoprazole (PROTONIX) 40 MG tablet TAKE 1 TABLET BY MOUTH EVERY DAY Patient taking differently: Take 40 mg by mouth daily.  09/22/19  Yes Collene Gobble, MD   predniSONE (DELTASONE) 10 MG tablet TAKE 1 TABLET (10 MG TOTAL) BY MOUTH DAILY WITH BREAKFAST. Patient taking differently: Take 10 mg by mouth daily with breakfast.  11/09/19  Yes Byrum, Rose Fillers, MD  Respiratory Therapy Supplies (FLUTTER) DEVI Use as directed. Patient taking differently: 1 each by Other route as directed.  08/12/19  Yes [place], Tammy S, NP  tamsulosin (FLOMAX) 0.4 MG CAPS capsule Take 1 capsule (0.4 mg total) by mouth daily. 06/02/19  Yes [place], Modena Nunnery, MD  theophylline (UNIPHYL) 400 MG 24 hr tablet TAKE 1 TABLET BY MOUTH EVERY DAY Patient taking differently: Take 400 mg by mouth daily.  11/09/19  Yes Collene Gobble, MD  traMADol (ULTRAM) 50 MG tablet Take 1 tablet (50 mg total) by mouth every 8 (eight) hours as needed. for pain Patient taking differently: Take 50 mg by mouth every 8 (eight) hours as needed for moderate pain.  07/24/19  Yes [place], Modena Nunnery, MD  zolpidem (AMBIEN) 5 MG tablet TAKE 1 TABLET (5 MG TOTAL) BY MOUTH AT BEDTIME AS NEEDED FOR SLEEP. 02/09/20  Yes Alycia Rossetti, MD    Physical Exam: Vitals:   02/16/20 1900 02/16/20 2030 02/16/20 2115 02/16/20 2245  BP: 103/77 124/65 110/62 107/71  Pulse: (!) 105 (!) 112 (!) 102 95  Resp: 17 (!) 42 19 (!) 22  Temp:      TempSrc:      SpO2: 91% 99% 96% 98%  Weight:      Height:        Physical Exam  Constitutional: He is oriented to person, place, and time.  Frail elderly male  HENT:  Head: Normocephalic.  Eyes: Right eye exhibits no discharge. Left eye exhibits no discharge.  Cardiovascular: Normal rate, regular rhythm and intact distal pulses.  Pulmonary/Chest: He is in respiratory distress. He has no rales.  Significantly tachypneic with respiratory rate in the 30s to 40s Accessory muscle use Very diminished breath sounds bilaterally with mild wheezing  Abdominal: Soft. Bowel sounds are normal. He exhibits no distension. There is no abdominal tenderness. There is no guarding.  Musculoskeletal:         General: No edema.     Cervical back: Neck supple.  Neurological: He is alert and oriented to person, place, and time.  Skin: Skin is warm and dry. He is not diaphoretic.    Labs on Admission: I have personally reviewed following labs and imaging studies  CBC: Recent Labs  Lab 02/16/20 1427  WBC 11.5*  NEUTROABS 10.6*  HGB 14.1  HCT 44.3  MCV 94.5  PLT 99991111   Basic Metabolic Panel: Recent Labs  Lab 02/16/20 1427  NA 139  K 3.7  CL 100  CO2 27  GLUCOSE 118*  BUN 17  CREATININE 0.97  CALCIUM 9.0   GFR: Estimated Creatinine Clearance: 38.9 mL/min (by C-G formula based on  SCr of 0.97 mg/dL). Liver Function Tests: Recent Labs  Lab 02/16/20 1427  AST 24  ALT 20  ALKPHOS 46  BILITOT 0.8  PROT 6.4*  ALBUMIN 3.7   No results for input(s): LIPASE, AMYLASE in the last 168 hours. No results for input(s): AMMONIA in the last 168 hours. Coagulation Profile: No results for input(s): INR, PROTIME in the last 168 hours. Cardiac Enzymes: No results for input(s): CKTOTAL, CKMB, CKMBINDEX, TROPONINI in the last 168 hours. BNP (last 3 results) No results for input(s): PROBNP in the last 8760 hours. HbA1C: No results for input(s): HGBA1C in the last 72 hours. CBG: No results for input(s): GLUCAP in the last 168 hours. Lipid Profile: No results for input(s): CHOL, HDL, LDLCALC, TRIG, CHOLHDL, LDLDIRECT in the last 72 hours. Thyroid Function Tests: Recent Labs    02/16/20 1821  TSH 1.104   Anemia Panel: No results for input(s): VITAMINB12, FOLATE, FERRITIN, TIBC, IRON, RETICCTPCT in the last 72 hours. Urine analysis:    Component Value Date/Time   COLORURINE YELLOW 01/22/2020 1240   APPEARANCEUR CLEAR 01/22/2020 1240   LABSPEC 1.020 01/22/2020 1240   PHURINE 6.5 01/22/2020 1240   GLUCOSEU NEGATIVE 01/22/2020 1240   HGBUR 1+ (A) 01/22/2020 1240   BILIRUBINUR NEGATIVE 11/12/2018 1720   KETONESUR NEGATIVE 01/22/2020 1240   PROTEINUR 1+ (A) 01/22/2020 1240    UROBILINOGEN 0.2 10/02/2013 1208   NITRITE NEGATIVE 01/22/2020 1240   LEUKOCYTESUR NEGATIVE 01/22/2020 1240    Radiological Exams on Admission: DG Chest 2 View  Result Date: 02/16/2020 CLINICAL DATA:  86 year old male with fever. EXAM: CHEST - 2 VIEW COMPARISON:  Chest radiograph dated 09/23/2019. chest CT dated 10/08/2019. FINDINGS: There is background of emphysema. Right upper lobe scarring with architectural distortion and traction. No consolidative changes. There is slight blunting of the right costophrenic angle which may be chronic and secondary to scarring. Trace right pleural effusion is not excluded. There is no pneumothorax. Atherosclerotic calcification of the aorta. The cardiac silhouette is within normal limits. Osteopenia. No acute osseous pathology. IMPRESSION: 1. No acute cardiopulmonary process. 2. Emphysema. 3. Right upper lobe scarring. Electronically Signed   By: Anner Crete M.D.   On: 02/16/2020 16:10    EKG: Independently reviewed.  Sinus tachycardia.  Assessment/Plan Principal Problem:   COPD with acute exacerbation (HCC) Active Problems:   Hypothyroidism   BPH (benign prostatic hyperplasia)   Acute on chronic respiratory failure with hypoxia (HCC)   Sepsis (HCC)   Acute on chronic hypoxic respiratory failure, suspect secondary to acute COPD exacerbation, ?PNA: Patient received albuterol and Atrovent inhaler treatments in addition to Solu-Medrol 125 mg in the ED.  Continues to be significantly tachypneic (respiratory rate in the 30s to 40s) with accessory muscle use.  Persistently tachycardic with heart rate in the 110s.  Very diminished breath sounds on exam with mild wheezing.  Uses 3 to 4 L supplemental oxygen at home, currently satting in the mid to upper 90s on 5 L supplemental oxygen.  Chest x-ray showing emphysema, right lateral lobe scarring, and no acute cardiopulmonary process.  SARS-CoV-2 PCR test negative. -Stat ABG ordered. Respiratory therapist has been  consulted.  Patient will be given a continuous 1 hour albuterol neb treatment and will be placed on BiPAP.  Continue treatment with DuoNebs every 6 hours, albuterol nebulizer as needed, and Pulmicort nebulizer twice daily.  Continue Solu-Medrol 60 mg every 6 hours.  Continue home theophylline and Mucinex.  Continue ceftriaxone and azithromycin.  Check procalcitonin level.  Check  D-dimer level, if elevated CT angiogram to rule out PE.  Check BNP level.  Patient is DNR/DNI.  Discussed with Dr. Madalyn Rob from critical care who agrees with management mentioned above.  If no improvement, will need to have a goals of care discussion with the patient and family.  Sepsis, ?pneumonia:  Patient is afebrile.  Tachycardic and tachypneic in the setting of respiratory distress.  Labs showing mild leukocytosis with WBC count 11.5.  Does have worsening lactic acidosis (1.3 >2.3), dehydration could also possibly be contributing.  Chest x-ray without consolidation. -Continue antibiotics.  IV fluid bolus ordered.  Trend lactate.  Check procalcitonin level.  Continue to monitor WBC count.  Also check UA to rule out UTI.  Hypothyroidism: TSH normal. -Continue home Synthroid  Anxiety -Continue home Ativan as needed  GERD -Continue PPI  BPH -Continue Flomax  DVT prophylaxis: Lovenox Code Status: DNR/DNI.  Discussed with patient and his daughter at bedside. Family Communication: Daughter updated. Disposition Plan: Status is: Inpatient  Remains inpatient appropriate because:IV treatments appropriate due to intensity of illness or inability to take PO and Inpatient level of care appropriate due to severity of illness   Dispo: The patient is from: Home              Anticipated d/c is to: SNF              Anticipated d/c date is: > 3 days              Patient currently is not medically stable to d/c.  The medical decision making on this patient was of high complexity and the patient is at high risk for clinical  deterioration, therefore this is a level 3 visit.  Shela Leff MD [facility]  If 7PM-7AM, please contact night-coverage [email or web address]  02/16/2020, 11:15 PM

## 2020-02-17 ENCOUNTER — Inpatient Hospital Stay (HOSPITAL_COMMUNITY): Payer: PPO

## 2020-02-17 ENCOUNTER — Encounter (HOSPITAL_COMMUNITY): Payer: Self-pay | Admitting: Internal Medicine

## 2020-02-17 ENCOUNTER — Telehealth: Payer: Self-pay | Admitting: Emergency Medicine

## 2020-02-17 DIAGNOSIS — R0602 Shortness of breath: Secondary | ICD-10-CM | POA: Diagnosis present

## 2020-02-17 LAB — RESPIRATORY PANEL BY PCR

## 2020-02-17 LAB — URINALYSIS, ROUTINE W REFLEX MICROSCOPIC
Bilirubin Urine: NEGATIVE
Glucose, UA: >=500 mg/dL — AB
Ketones, ur: 5 mg/dL — AB
Leukocytes,Ua: NEGATIVE
Nitrite: NEGATIVE
Protein, ur: 30 mg/dL — AB
Specific Gravity, Urine: >1.046 — ABNORMAL HIGH (ref 1.005–1.030)
pH: 5 (ref 5.0–8.0)

## 2020-02-17 LAB — GLUCOSE, CAPILLARY: Glucose-Capillary: 218 mg/dL — ABNORMAL HIGH (ref 70–99)

## 2020-02-17 LAB — CBC
HCT: 38.7 % — ABNORMAL LOW (ref 39.0–52.0)
Hemoglobin: 12.4 g/dL — ABNORMAL LOW (ref 13.0–17.0)
MCH: 30.2 pg (ref 26.0–34.0)
MCHC: 32 g/dL (ref 30.0–36.0)
MCV: 94.2 fL (ref 80.0–100.0)
Platelets: 289 10*3/uL (ref 150–400)
RBC: 4.11 MIL/uL — ABNORMAL LOW (ref 4.22–5.81)
RDW: 13.9 % (ref 11.5–15.5)
WBC: 6 10*3/uL (ref 4.0–10.5)
nRBC: 0 % (ref 0.0–0.2)

## 2020-02-17 LAB — LACTIC ACID, PLASMA
Lactic Acid, Venous: 7.4 mmol/L (ref 0.5–1.9)
Lactic Acid, Venous: 3.2 mmol/L (ref 0.5–1.9)
Lactic Acid, Venous: 2.2 mmol/L (ref 0.5–1.9)

## 2020-02-17 LAB — MRSA PCR SCREENING: MRSA by PCR: NEGATIVE

## 2020-02-17 LAB — PROCALCITONIN: Procalcitonin: <0.1 ng/mL

## 2020-02-17 LAB — THEOPHYLLINE LEVEL: Theophylline Lvl: 3.6 ug/mL — ABNORMAL LOW (ref 10.0–20.0)

## 2020-02-17 IMAGING — CT CT ANGIO CHEST
2 of 7 series · 18 of 46 positions shown · IV contrast (APPLIED)
Comparison: [DATE]

CLINICAL DATA: Shortness of breath with dyspnea

EXAM:
CT ANGIOGRAPHY CHEST WITH CONTRAST
TECHNIQUE: Multidetector CT imaging of the chest was performed using the
standard protocol during bolus administration of intravenous
contrast. Multiplanar CT image reconstructions and MIPs were
obtained to evaluate the vascular anatomy.
CONTRAST:  75mL OMNIPAQUE IOHEXOL 350 MG/ML SOLN

[Series 9: thins · axial · 0.77mm/px · z∈[+1092,+1337]mm · 15 of 394 slices shown]
[im 22/394  lung]
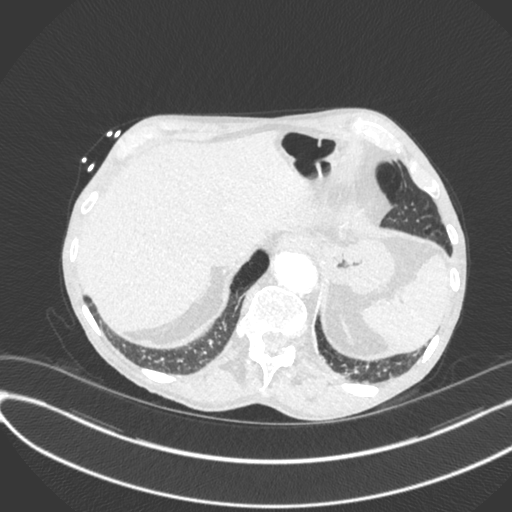
[im 44/394  soft-tissue]
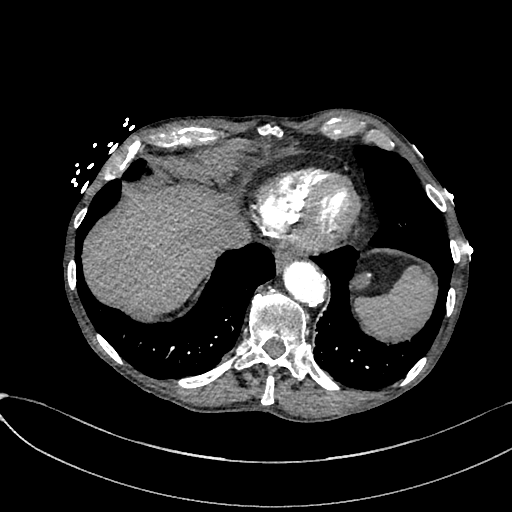
[im 66/394  lung]
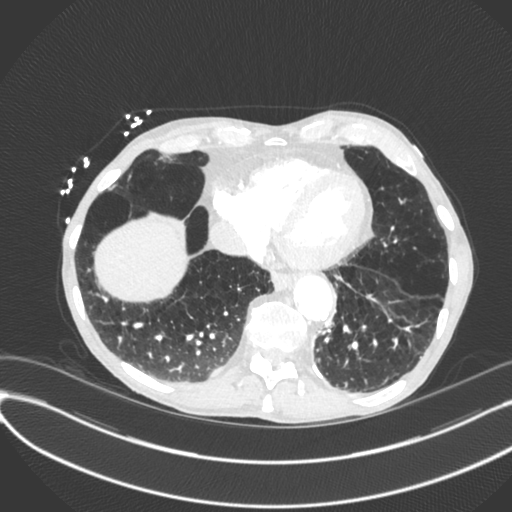
[im 88/394  soft-tissue]
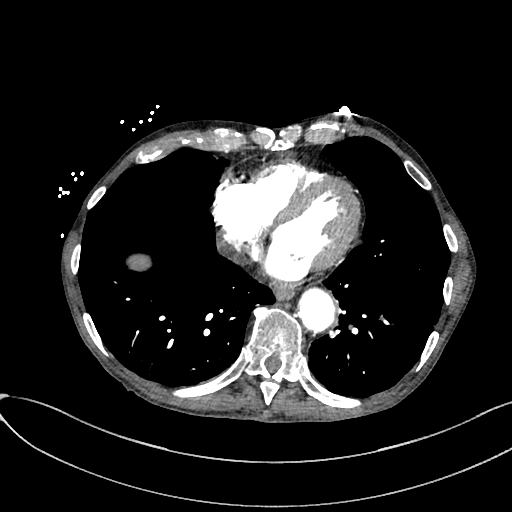
[im 132/394  lung]
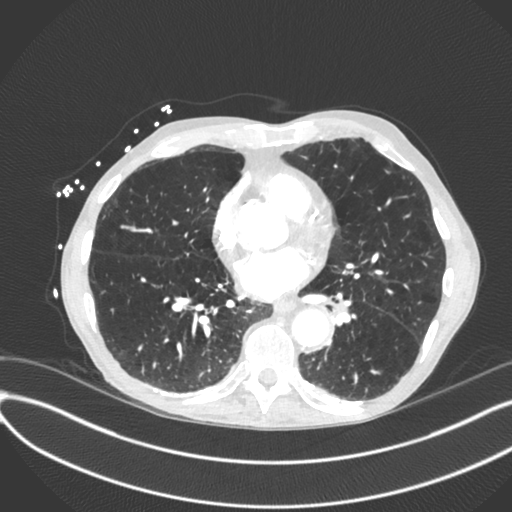
[im 153/394  soft-tissue]
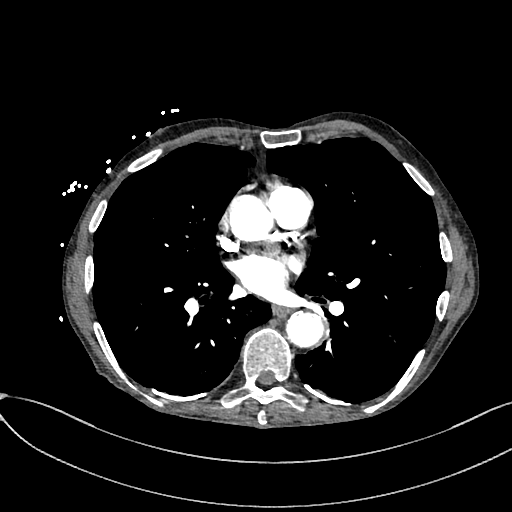
[im 175/394  lung]
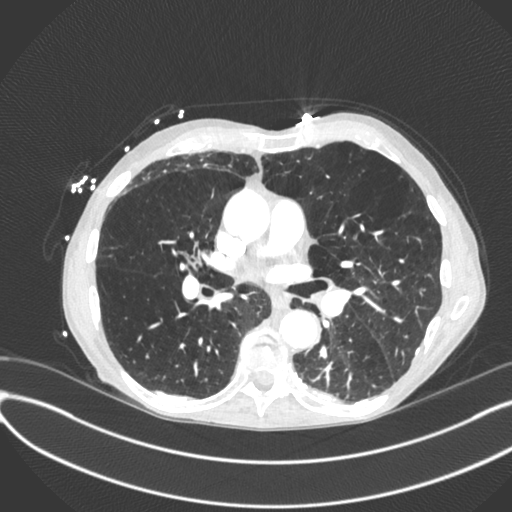
[im 197/394  soft-tissue]
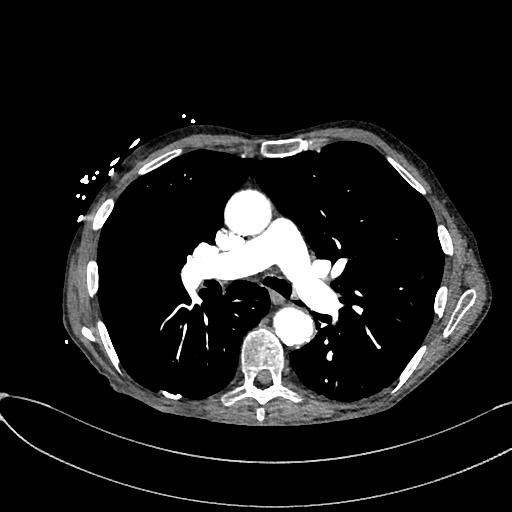
[im 219/394  lung]
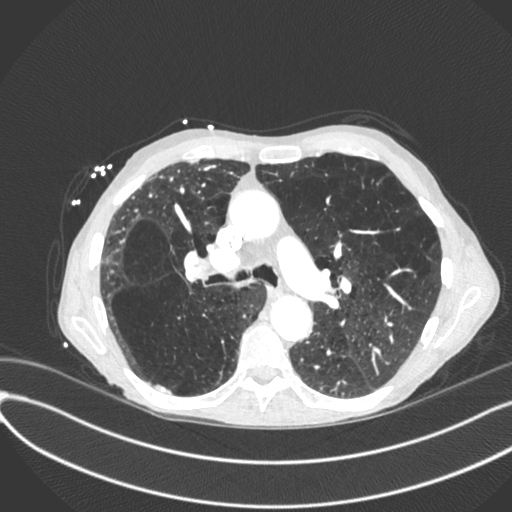
[im 241/394  soft-tissue]
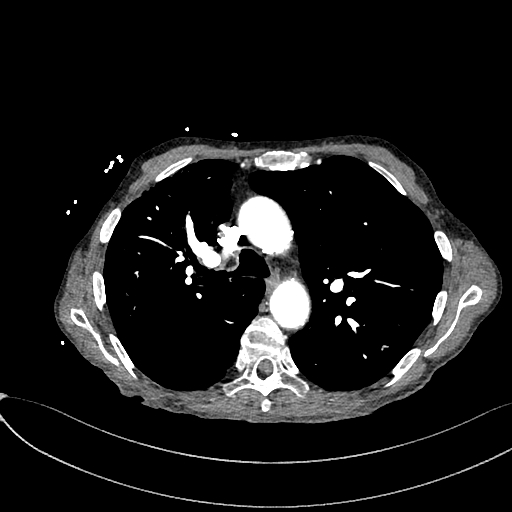
[im 263/394  lung]
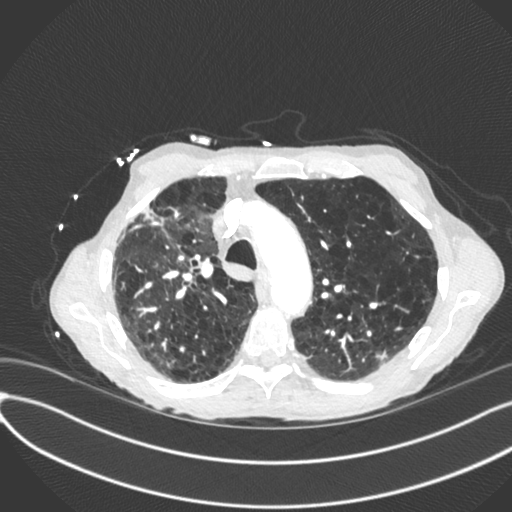
[im 306/394  soft-tissue]
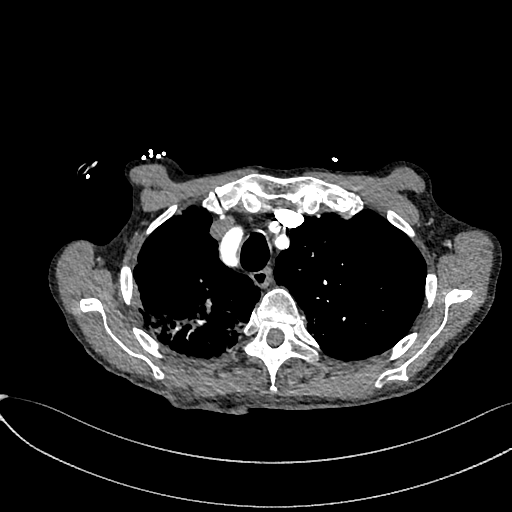
[im 328/394  lung]
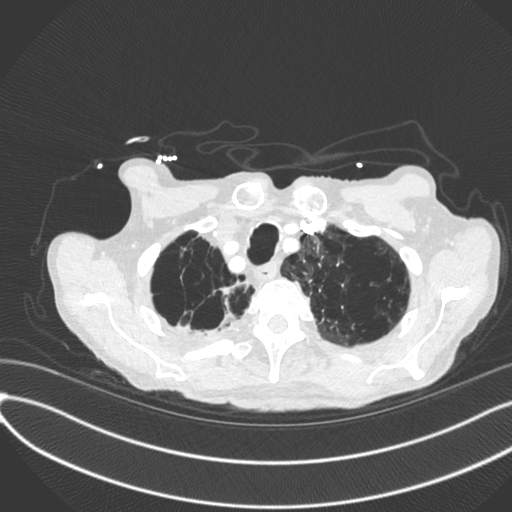
[im 350/394  soft-tissue]
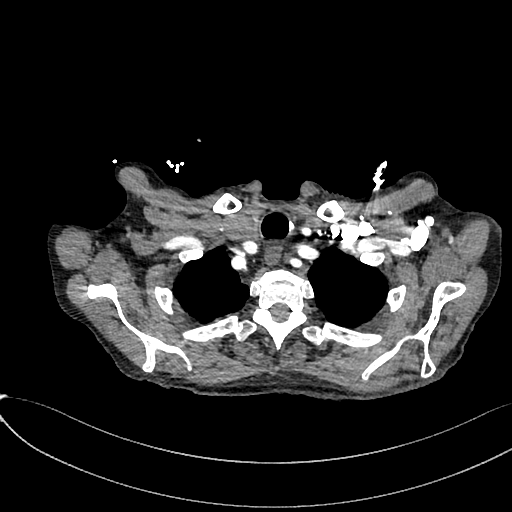
[im 372/394  lung]
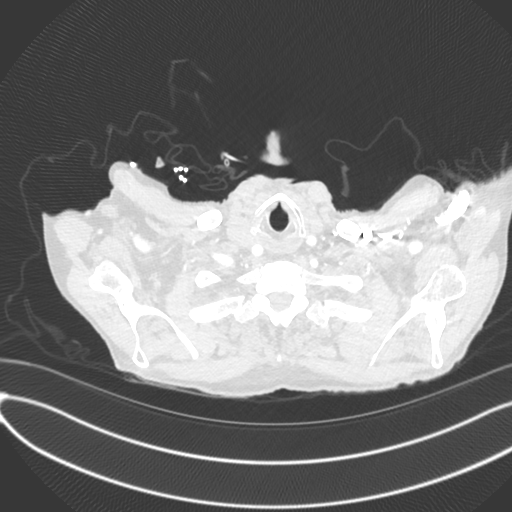

[Series 10: cor · coronal · 0.59mm/px · 3 of 126 slices shown]
[im 32/126  soft-tissue]
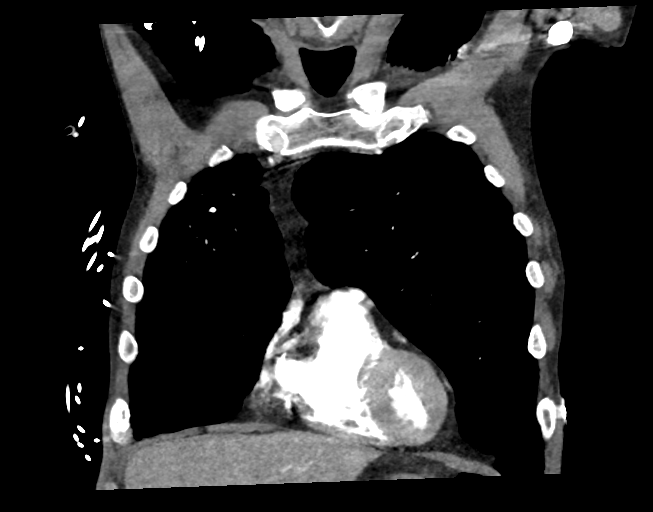
[im 63/126  soft-tissue]
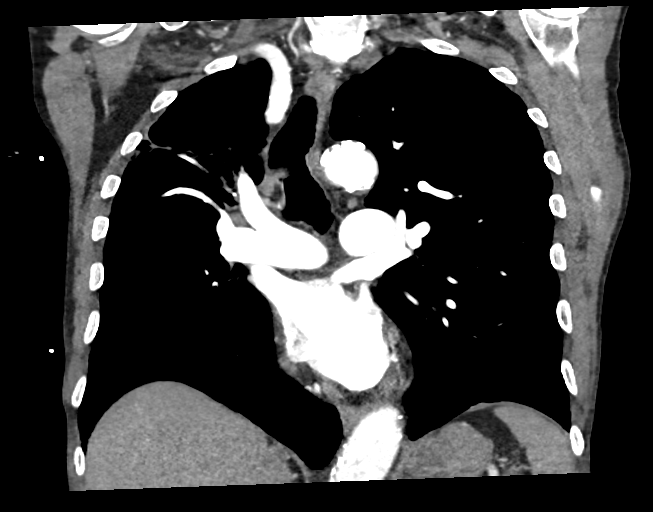
[im 94/126  soft-tissue]
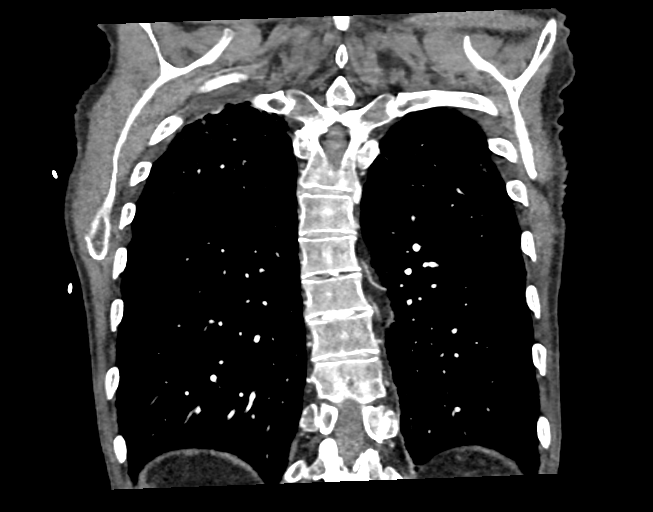

[18 of 46 positions shown; findings below may reference images not displayed]

FINDINGS: Cardiovascular: There is a optimal opacification of the pulmonary
arteries. There is no central,segmental, or subsegmental filling
defects within the pulmonary arteries. The heart is normal in size.
No pericardial effusion or thickening. No evidence right heart
strain. There is normal three-vessel brachiocephalic anatomy without
proximal stenosis. Scattered aortic atherosclerosis is noted. There
is a mild amount of calcification at the origin of the great
vessels. Coronary artery calcifications are seen.

Mediastinum/Nodes: No hilar, mediastinal, or axillary adenopathy.
Thyroid gland, trachea, and esophagus demonstrate no significant
findings.

Lungs/Pleura: Again noted are extensive centrilobular emphysematous
changes with large bullae at both lung apices. There is
architectural distortion and bronchiectasis seen at the right lung
apex. Bilateral pleural calcifications are noted. No new airspace
consolidation or pleural effusion.

Upper Abdomen: No acute abnormalities present in the visualized
portions of the upper abdomen.

Musculoskeletal: No chest wall abnormality. No acute or significant
osseous findings.

Review of the MIP images confirms the above findings.
IMPRESSION: 1. No central, segmental, or subsegmental pulmonary embolism.
2. No other acute intrathoracic pathology to explain the patient's
symptoms.
3.  Aortic Atherosclerosis ([KG]-[KG]).
4. Unchanged advanced emphysema ([KG]-[KG]).

## 2020-02-17 MED ORDER — SODIUM CHLORIDE 0.9 % IV SOLN: INTRAVENOUS | Status: DC

## 2020-02-17 MED ORDER — PREDNISONE 20 MG PO TABS
40.0000 mg | ORAL_TABLET | Freq: Every day | ORAL | Status: DC
  Administered 2020-02-18 – 2020-02-19 (×2): 40 mg via ORAL
  Filled 2020-02-17 (×3): qty 2

## 2020-02-17 MED ORDER — CHLORHEXIDINE GLUCONATE CLOTH 2 % EX PADS
6.0000 | MEDICATED_PAD | Freq: Every day | CUTANEOUS | Status: DC
  Administered 2020-02-17 – 2020-02-18 (×2): 6 via TOPICAL

## 2020-02-17 MED ORDER — ZOLPIDEM TARTRATE 5 MG PO TABS
5.0000 mg | ORAL_TABLET | Freq: Every evening | ORAL | Status: AC | PRN
  Administered 2020-02-17: 5 mg via ORAL
  Filled 2020-02-17: qty 1

## 2020-02-17 MED ORDER — IOHEXOL 350 MG/ML SOLN
75.0000 mL | Freq: Once | INTRAVENOUS | Status: AC | PRN
  Administered 2020-02-17: 75 mL via INTRAVENOUS

## 2020-02-17 NOTE — Progress Notes (Signed)
Pts order is BIPAP PRN. Pt not in need of BIPAP at this time. Pt states he feels fine, feels his normal self. RT will continue to monitor.

## 2020-02-17 NOTE — Progress Notes (Signed)
PROGRESS NOTE    John Black  [id number] DOB: 08-23-1929 DOA: 02/16/2020 PCP: Alycia Rossetti, MD   Chief Complaint  Patient presents with  . Shortness of Breath    Brief Narrative: John Black is N[place] Steedley 86 y.o. male with medical history significant of COPD/emphysema, chronic respiratory failure on home oxygen, hypothyroidism presenting with complaints of shortness of breath.  Patient reports 3 to 4-day history of progressively worsening dyspnea.  He is also wheezing but not coughing much.  Denies chest pain.  Denies fevers or chills.  States he uses 3 to 4 L supplemental oxygen at home all the time.  No additional history could be obtained from the patient due to respiratory distress.  Daughter states patient has been vaccinated for Covid.  States he does not have John Black history of blood clots.  Assessment & Plan:   Principal Problem:   COPD with acute exacerbation ([place]) Active Problems:   Hypothyroidism   BPH (benign prostatic hyperplasia)   Acute on chronic respiratory failure with hypoxia (HCC)   Sepsis (HCC)   SOB (shortness of breath)  Acute on chronic hypoxic respiratory failure, suspect secondary to acute COPD exacerbation:  CT PE protocol without PE, no acute intrathoracic pathology, advanced emphysema Continue solumedrol, plan for transition to PO tomorrow if improving Scheduled and prn nebs Ceftriaxone, azithromycin  Home theophylline Wean O2 as able  Will need home O2 screen prior to d/c Negative COVID testing, follow RVP given fever  Sepsis  Febrile Illness:  With COPD exacerbation, respiratory distress, suspect he may have viral illness leading to exacerbation.  Follow RVP.  Patient is afebrile.  Tachycardic and tachypneic in the setting of respiratory distress.  Leukocytosis improved today Lactic acidosis likely related to nebs -> follow with IVF (improving) UA not c/w with UTI.  No blood or urine cx obtained on admission.  Obtain cultures if  fevers Continue abx as above   Abnormal UA  Microscopic Hematuria  Mild Proteinuria: follow outpatient  Hypothyroidism: TSH normal. -Continue home Synthroid  Anxiety -Continue home Ativan as needed  GERD -Continue PPI  BPH -Continue Flomax  DVT prophylaxis: lovenox Code Status: FNR Family Communication: daughter and wife at bedside Disposition:   Status is: Inpatient  Remains inpatient appropriate because:Inpatient level of care appropriate due to severity of illness   Dispo: The patient is from: Home              Anticipated d/c is to: Home              Anticipated d/c date is: 2 days              Patient currently is not medically stable to d/c.   Consultants:   none  Procedures:   none  Antimicrobials:  Anti-infectives (From admission, onward)   Start     Dose/Rate Route Frequency Ordered Stop   02/17/20 2030  azithromycin (ZITHROMAX) 500 mg in sodium chloride 0.9 % 250 mL IVPB     500 mg 250 mL/hr over 60 Minutes Intravenous Every 24 hours 02/16/20 2255     02/16/20 2130  cefTRIAXone (ROCEPHIN) 1 g in sodium chloride 0.9 % 100 mL IVPB     1 g 200 mL/hr over 30 Minutes Intravenous  Once 02/16/20 2021 02/16/20 2247   02/16/20 2130  cefTRIAXone (ROCEPHIN) 1 g in sodium chloride 0.9 % 100 mL IVPB     1 g 200 mL/hr over 30 Minutes Intravenous Every 24 hours 02/16/20 2255  02/16/20 2030  azithromycin (ZITHROMAX) tablet 500 mg     500 mg Oral  Once 02/16/20 2021 02/16/20 2102     Subjective: Feels better than on admission, about 80% Came in with fevers, SOB, congestion, desats  Objective: Vitals:   02/17/20 0401 02/17/20 0750 02/17/20 0800 02/17/20 0807  BP:  (!) 108/57 106/62   Pulse: (!) 106 (!) 101 90 94  Resp: 18 (!) 31 (!) 21 16  Temp:  98.5 F (36.9 C)    TempSrc:  Oral    SpO2:  93% 94% 93%  Weight:      Height:        Intake/Output Summary (Last 24 hours) at 02/17/2020 1259 Last data filed at 02/17/2020 0750 Gross per 24 hour   Intake 100 ml  Output 700 ml  Net -600 ml   Filed Weights   02/16/20 1418  Weight: 52.2 kg    Examination:  General exam: Appears calm and comfortable  Respiratory system: diffuse wheezing  Cardiovascular system: S1 & S2 heard, RRR.  Gastrointestinal system: Abdomen is nondistended, soft and nontender.  Central nervous system: Alert and oriented. No focal neurological deficits. Extremities: no LEE Skin: No rashes, lesions or ulcers Psychiatry: Judgement and insight appear normal. Mood & affect appropriate.     Data Reviewed: I have personally reviewed following labs and imaging studies  CBC: Recent Labs  Lab 02/16/20 1427 02/17/20 0300  WBC 11.5* 6.0  NEUTROABS 10.6*  --   HGB 14.1 12.4*  HCT 44.3 38.7*  MCV 94.5 94.2  PLT 320 [id number]    Basic Metabolic Panel: Recent Labs  Lab 02/16/20 1427  NA 139  K 3.7  CL 100  CO2 27  GLUCOSE 118*  BUN 17  CREATININE 0.97  CALCIUM 9.0    GFR: Estimated Creatinine Clearance: 38.9 mL/min (by C-G formula based on SCr of 0.97 mg/dL).  Liver Function Tests: Recent Labs  Lab 02/16/20 1427  AST 24  ALT 20  ALKPHOS 46  BILITOT 0.8  PROT 6.4*  ALBUMIN 3.7    CBG: Recent Labs  Lab 02/17/20 0302  GLUCAP 218*     Recent Results (from the past 240 hour(s))  SARS Coronavirus 2 by RT PCR (hospital order, performed in [facility] hospital lab) Nasopharyngeal Nasopharyngeal Swab     Status: None   Collection Time: 02/16/20  9:05 PM   Specimen: Nasopharyngeal Swab  Result Value Ref Range Status   SARS Coronavirus 2 NEGATIVE NEGATIVE Final    Comment: (NOTE) SARS-CoV-2 target nucleic acids are NOT DETECTED. The SARS-CoV-2 RNA is generally detectable in upper and lower respiratory specimens during the acute phase of infection. The lowest concentration of SARS-CoV-2 viral copies this assay can detect is 250 copies / mL. John Black negative result does not preclude SARS-CoV-2 infection and should not be used as the sole basis  for treatment or other patient management decisions.  John Black negative result may occur with improper specimen collection / handling, submission of specimen other than nasopharyngeal swab, presence of viral mutation(s) within the areas targeted by this assay, and inadequate number of viral copies (<250 copies / mL). Jahrel Borthwick negative result must be combined with clinical observations, patient history, and epidemiological information. Fact Sheet for Patients:   StrictlyIdeas.no Fact Sheet for Healthcare Providers: BankingDealers.co.za This test is not yet approved or cleared  by the [place] FDA and has been authorized for detection and/or diagnosis of SARS-CoV-2 by FDA under an Emergency Use Authorization (EUA).  This EUA will  remain in effect (meaning this test can be used) for the duration of the COVID-19 declaration under Section 564(b)(1) of the Act, 21 U.S.C. section 360bbb-3(b)(1), unless the authorization is terminated or revoked sooner. Performed at [facility], [place] [address] [place],  [address]   MRSA PCR Screening     Status: None   Collection Time: 02/17/20  3:13 AM   Specimen: Nasopharyngeal  Result Value Ref Range Status   MRSA by PCR NEGATIVE NEGATIVE Final    Comment:        The GeneXpert MRSA Assay (FDA approved for NASAL specimens only), is one component of [place] comprehensive MRSA colonization surveillance program. It is not intended to diagnose MRSA infection nor to guide or monitor treatment for MRSA infections. Performed at [facility], [place] [address], [place],  [address]          Radiology Studies: DG Chest 2 View  Result Date: 02/16/2020 CLINICAL DATA:  86 year old male with fever. EXAM: CHEST - 2 VIEW COMPARISON:  Chest radiograph dated 09/23/2019. chest CT dated 10/08/2019. FINDINGS: There is background of emphysema. Right upper lobe scarring with architectural distortion and  traction. No consolidative changes. There is slight blunting of the right costophrenic angle which may be chronic and secondary to scarring. Trace right pleural effusion is not excluded. There is no pneumothorax. Atherosclerotic calcification of the aorta. The cardiac silhouette is within normal limits. Osteopenia. No acute osseous pathology. IMPRESSION: 1. No acute cardiopulmonary process. 2. Emphysema. 3. Right upper lobe scarring. Electronically Signed   By: Anner Crete M.D.   On: 02/16/2020 16:10   CT ANGIO CHEST PE W OR WO CONTRAST  Result Date: 02/17/2020 CLINICAL DATA:  Shortness of breath with dyspnea EXAM: CT ANGIOGRAPHY CHEST WITH CONTRAST TECHNIQUE: Multidetector CT imaging of the chest was performed using the standard protocol during bolus administration of intravenous contrast. Multiplanar CT image reconstructions and MIPs were obtained to evaluate the vascular anatomy. CONTRAST:  50mL OMNIPAQUE IOHEXOL 350 MG/ML SOLN COMPARISON:  October 08, 2019 FINDINGS: Cardiovascular: There is Viktoria Gruetzmacher optimal opacification of the pulmonary arteries. There is no central,segmental, or subsegmental filling defects within the pulmonary arteries. The heart is normal in size. No pericardial effusion or thickening. No evidence right heart strain. There is normal three-vessel brachiocephalic anatomy without proximal stenosis. Scattered aortic atherosclerosis is noted. There is Rubby Barbary mild amount of calcification at the origin of the great vessels. Coronary artery calcifications are seen. Mediastinum/Nodes: No hilar, mediastinal, or axillary adenopathy. Thyroid gland, trachea, and esophagus demonstrate no significant findings. Lungs/Pleura: Again noted are extensive centrilobular emphysematous changes with large bullae at both lung apices. There is architectural distortion and bronchiectasis seen at the right lung apex. Bilateral pleural calcifications are noted. No new airspace consolidation or pleural effusion. Upper  Abdomen: No acute abnormalities present in the visualized portions of the upper abdomen. Musculoskeletal: No chest wall abnormality. No acute or significant osseous findings. Review of the MIP images confirms the above findings. IMPRESSION: 1. No central, segmental, or subsegmental pulmonary embolism. 2. No other acute intrathoracic pathology to explain the patient's symptoms. 3.  Aortic Atherosclerosis (ICD10-I70.0). 4. Unchanged advanced emphysema (ICD10-J43.9). Electronically Signed   By: Prudencio Pair M.D.   On: 02/17/2020 02:35        Scheduled Meds: . budesonide (PULMICORT) nebulizer solution  0.25 mg Nebulization BID  . Chlorhexidine Gluconate Cloth  6 each Topical Daily  . enoxaparin (LOVENOX) injection  40 mg Subcutaneous Q24H  . guaiFENesin  600  mg Oral BID  . ipratropium-albuterol  3 mL Nebulization Q6H  . levothyroxine  75 mcg Oral Q0600  . methylPREDNISolone (SOLU-MEDROL) injection  60 mg Intravenous Q6H  . pantoprazole  40 mg Oral Daily  . tamsulosin  0.4 mg Oral Daily  . theophylline  400 mg Oral Daily   Continuous Infusions: . sodium chloride    . azithromycin    . cefTRIAXone (ROCEPHIN)  IV       LOS: 1 day    Time spent: over 30 min    Fayrene Helper, MD [facility]   To contact the attending provider between 7A-7P or the covering provider during after hours 7P-7A, please log into the web site [email or web address] and access using universal [place] password for that web site. If you do not have the password, please call the hospital operator.  02/17/2020, 12:59 PM

## 2020-02-17 NOTE — Evaluation (Signed)
Physical Therapy Evaluation Patient Details Name: John Black [name]: [id number] DOB: 05/18/1929 Today's Date: 02/17/2020   History of Present Illness  86 y.o. male with medical history significant of COPD/emphysema, chronic respiratory failure on home oxygen, hypothyroidism presenting with complaints of shortness of breath.  Patient reports 3 to 4-day history of progressively worsening dyspnea. Pt uses 3-4 L of oxygen at home at all times. Pt admitted with concerns for COPD excerbation.  Clinical Impression  Pt presents to PT with deficits in cardiopulmonary function, gait, balance, endurance, and power. Pt with increased supplemental oxygen needs with activity, desaturating while on 4L [address] while ambulating. Pt also demonstrates increased sway during ambulation with UE support, although not utilizing cane or rollator as the pt typically does at baseline. Pt will benefit from further gait training and education on energy conservation techniques during hospitalization to aide in a return to independence. PT currently recommends discharge home with home health PT and intermittent assistance from family.    Follow Up Recommendations Home health PT;Supervision for mobility/OOB    Equipment Recommendations  None recommended by PT(pt owns necessary DME)    Recommendations for Other Services       Precautions / Restrictions Precautions Precautions: Fall Restrictions Weight Bearing Restrictions: No      Mobility  Bed Mobility Overal bed mobility: Needs Assistance Bed Mobility: Supine to Sit     Supine to sit: Supervision        Transfers Overall transfer level: Needs assistance Equipment used: None Transfers: Sit to/from Stand Sit to Stand: Supervision            Ambulation/Gait Ambulation/Gait assistance: Supervision Gait Distance (Feet): 80 Feet Assistive device: IV Pole Gait Pattern/deviations: Step-to pattern Gait velocity: reduced Gait velocity interpretation:  <1.8 ft/sec, indicate of risk for recurrent falls General Gait Details: pt with shortened step to gait pattern with reduced stride length, pt initially with BUE support of IV pole, progresses to unilateral UE support but does demonstrate increased lateral sway when doing so  Stairs            Wheelchair Mobility    Modified Rankin (Stroke Patients Only)       Balance Overall balance assessment: Needs assistance Sitting-balance support: Feet supported;No upper extremity supported Sitting balance-Leahy Scale: Good Sitting balance - Comments: supervision   Standing balance support: No upper extremity supported;During functional activity Standing balance-Leahy Scale: Fair Standing balance comment: close supervision with unilateral UE support of  IV pole                             Pertinent Vitals/Pain Pain Assessment: No/denies pain    Home Living Family/patient expects to be discharged to:: Private residence Living Arrangements: Spouse/significant other;Children;Other relatives(spouse, daughter, SIL, gdtr ) Available Help at Discharge: Family;Available 24 hours/day Type of Home: House Home Access: Level entry     Home Layout: Multi-level   Additional Comments: pt typically on 4L [place]    Prior Function Level of Independence: Independent with assistive device(s)         Comments: pt utilizes cane or rollator for most mobility     Hand Dominance   Dominant Hand: Right    Extremity/Trunk Assessment   Upper Extremity Assessment Upper Extremity Assessment: Overall WFL for tasks assessed    Lower Extremity Assessment Lower Extremity Assessment: Generalized weakness    Cervical / Trunk Assessment Cervical / Trunk Assessment: Kyphotic  Communication   Communication: No difficulties  Cognition Arousal/Alertness: Awake/alert Behavior During Therapy: WFL for tasks assessed/performed Overall Cognitive Status: Impaired/Different from baseline Area of  Impairment: Orientation                 Orientation Level: Disoriented to;Time             General Comments: pt initially reports it is 2022, does know it is june      General Comments General comments (skin integrity, edema, etc.): pt on 5L [place] upon PT arrival saturating in mid-90s. PT weans to 4L [place] for activity and pt does desat 2-3 times to 87%, with more notable desaturation to 83% upon sitting at end of ambulation. PT returns pt to 5L [place] with quick return to 91% saturation. Pt saturating at 96% on 5L [place], other VSS at time of departure.    Exercises     Assessment/Plan    PT Assessment Patient needs continued PT services  PT Problem List Decreased strength;Decreased activity tolerance;Decreased balance;Decreased mobility;Decreased knowledge of precautions;Cardiopulmonary status limiting activity       PT Treatment Interventions DME instruction;Gait training;Functional mobility training;Therapeutic activities;Therapeutic exercise;Balance training;Neuromuscular re-education;Patient/family education    PT Goals (Current goals can be found in the Care Plan section)  Acute Rehab PT Goals Patient Stated Goal: To return to independent mobility PT Goal Formulation: With patient Time For Goal Achievement: 03/02/20 Potential to Achieve Goals: Good Additional Goals Additional Goal #1: Pt will ambulate >100' on 4L  with a BORG dyspnea rating of 4/10 or less.    Frequency Min 3X/week   Barriers to discharge        Co-evaluation               AM-PAC PT "6 Clicks" Mobility  Outcome Measure Help needed turning from your back to your side while in a flat bed without using bedrails?: None Help needed moving from lying on your back to sitting on the side of a flat bed without using bedrails?: None Help needed moving to and from a bed to a chair (including a wheelchair)?: None Help needed standing up from a chair using your arms (e.g., wheelchair or bedside chair)?:  None Help needed to walk in hospital room?: A Little Help needed climbing 3-5 steps with a railing? : A Lot 6 Click Score: 21    End of Session Equipment Utilized During Treatment: Oxygen Activity Tolerance: Patient tolerated treatment well Patient left: in chair;with call bell/phone within reach;with chair alarm set Nurse Communication: Mobility status PT Visit Diagnosis: Other (comment);Other abnormalities of gait and mobility (R26.89);Unsteadiness on feet (R26.81)(cardiopulmonary endurance deficits)    Time: [id number] PT Time Calculation (min) (ACUTE ONLY): 27 min   Charges:   PT Evaluation $PT Eval Moderate Complexity: 1 Mod PT Treatments $Gait Training: 8-22 mins        Zenaida Niece, PT, DPT Acute Rehabilitation Pager: [phone]   Zenaida Niece 02/17/2020, 3:57 PM

## 2020-02-18 ENCOUNTER — Inpatient Hospital Stay (HOSPITAL_COMMUNITY): Payer: PPO

## 2020-02-18 DIAGNOSIS — I340 Nonrheumatic mitral (valve) insufficiency: Secondary | ICD-10-CM

## 2020-02-18 LAB — CBC WITH DIFFERENTIAL/PLATELET
Abs Immature Granulocytes: 0.09 10*3/uL — ABNORMAL HIGH (ref 0.00–0.07)
Basophils Absolute: 0 10*3/uL (ref 0.0–0.1)
Basophils Relative: 0 %
Eosinophils Absolute: 0 10*3/uL (ref 0.0–0.5)
Eosinophils Relative: 0 %
HCT: 33.8 % — ABNORMAL LOW (ref 39.0–52.0)
Hemoglobin: 10.9 g/dL — ABNORMAL LOW (ref 13.0–17.0)
Immature Granulocytes: 1 %
Lymphocytes Relative: 4 %
Lymphs Abs: 0.4 10*3/uL — ABNORMAL LOW (ref 0.7–4.0)
MCH: 30.8 pg (ref 26.0–34.0)
MCHC: 32.2 g/dL (ref 30.0–36.0)
MCV: 95.5 fL (ref 80.0–100.0)
Monocytes Absolute: 0.7 10*3/uL (ref 0.1–1.0)
Monocytes Relative: 7 %
Neutro Abs: 8.9 10*3/uL — ABNORMAL HIGH (ref 1.7–7.7)
Neutrophils Relative %: 88 %
Platelets: 273 10*3/uL (ref 150–400)
RBC: 3.54 MIL/uL — ABNORMAL LOW (ref 4.22–5.81)
RDW: 14 % (ref 11.5–15.5)
WBC: 10.1 10*3/uL (ref 4.0–10.5)
nRBC: 0 % (ref 0.0–0.2)

## 2020-02-18 LAB — ECHOCARDIOGRAM COMPLETE
Height: 64 in
Weight: 1840 oz

## 2020-02-18 LAB — COMPREHENSIVE METABOLIC PANEL
ALT: 23 U/L (ref 0–44)
AST: 29 U/L (ref 15–41)
Albumin: 2.8 g/dL — ABNORMAL LOW (ref 3.5–5.0)
Alkaline Phosphatase: 31 U/L — ABNORMAL LOW (ref 38–126)
Anion gap: 12 (ref 5–15)
BUN: 22 mg/dL (ref 8–23)
CO2: 25 mmol/L (ref 22–32)
Calcium: 8.3 mg/dL — ABNORMAL LOW (ref 8.9–10.3)
Chloride: 102 mmol/L (ref 98–111)
Creatinine, Ser: 0.88 mg/dL (ref 0.61–1.24)
GFR calc Af Amer: >60 mL/min (ref >60)
GFR calc non Af Amer: >60 mL/min (ref >60)
Glucose, Bld: 145 mg/dL — ABNORMAL HIGH (ref 70–99)
Potassium: 3.4 mmol/L — ABNORMAL LOW (ref 3.5–5.1)
Sodium: 139 mmol/L (ref 135–145)
Total Bilirubin: 0.4 mg/dL (ref 0.3–1.2)
Total Protein: 4.9 g/dL — ABNORMAL LOW (ref 6.5–8.1)

## 2020-02-18 LAB — URINE CULTURE: Culture: NO GROWTH

## 2020-02-18 LAB — HEMOGLOBIN AND HEMATOCRIT, BLOOD
HCT: 36.9 % — ABNORMAL LOW (ref 39.0–52.0)
Hemoglobin: 12 g/dL — ABNORMAL LOW (ref 13.0–17.0)

## 2020-02-18 LAB — PHOSPHORUS: Phosphorus: 2.3 mg/dL — ABNORMAL LOW (ref 2.5–4.6)

## 2020-02-18 LAB — MAGNESIUM: Magnesium: 1.8 mg/dL (ref 1.7–2.4)

## 2020-02-18 MED ORDER — LEVALBUTEROL HCL 0.63 MG/3ML IN NEBU
0.6300 mg | INHALATION_SOLUTION | Freq: Four times a day (QID) | RESPIRATORY_TRACT | Status: DC
  Administered 2020-02-18 (×2): 0.63 mg via RESPIRATORY_TRACT
  Filled 2020-02-18 (×2): qty 3

## 2020-02-18 MED ORDER — ZOLPIDEM TARTRATE 5 MG PO TABS
5.0000 mg | ORAL_TABLET | Freq: Every evening | ORAL | Status: AC | PRN
  Administered 2020-02-18: 5 mg via ORAL
  Filled 2020-02-18: qty 1

## 2020-02-18 MED ORDER — IPRATROPIUM BROMIDE 0.02 % IN SOLN
2.5000 mL | Freq: Four times a day (QID) | RESPIRATORY_TRACT | Status: DC
  Administered 2020-02-18 (×2): 0.5 mg via RESPIRATORY_TRACT
  Filled 2020-02-18 (×2): qty 2.5

## 2020-02-18 MED ORDER — AZITHROMYCIN 250 MG PO TABS
500.0000 mg | ORAL_TABLET | Freq: Every day | ORAL | Status: DC
  Administered 2020-02-18: 500 mg via ORAL
  Filled 2020-02-18: qty 2

## 2020-02-18 MED ORDER — IPRATROPIUM BROMIDE 0.02 % IN SOLN: 2.5000 mL | Freq: Four times a day (QID) | RESPIRATORY_TRACT | Status: DC

## 2020-02-18 MED ORDER — IPRATROPIUM BROMIDE 0.02 % IN SOLN
2.5000 mL | Freq: Two times a day (BID) | RESPIRATORY_TRACT | Status: DC
  Administered 2020-02-19: 0.5 mg via RESPIRATORY_TRACT
  Filled 2020-02-18: qty 2.5

## 2020-02-18 MED ORDER — POTASSIUM CHLORIDE CRYS ER 20 MEQ PO TBCR
40.0000 meq | EXTENDED_RELEASE_TABLET | Freq: Once | ORAL | Status: AC
  Administered 2020-02-18: 40 meq via ORAL
  Filled 2020-02-18: qty 2

## 2020-02-18 MED ORDER — LEVALBUTEROL HCL 0.63 MG/3ML IN NEBU
0.6300 mg | INHALATION_SOLUTION | Freq: Two times a day (BID) | RESPIRATORY_TRACT | Status: DC
  Administered 2020-02-19: 0.63 mg via RESPIRATORY_TRACT
  Filled 2020-02-18: qty 3

## 2020-02-18 MED ORDER — LEVALBUTEROL HCL 0.63 MG/3ML IN NEBU: 0.6300 mg | INHALATION_SOLUTION | Freq: Four times a day (QID) | RESPIRATORY_TRACT | Status: DC | PRN

## 2020-02-18 MED ORDER — LEVALBUTEROL HCL 0.63 MG/3ML IN NEBU: 0.6300 mg | INHALATION_SOLUTION | Freq: Four times a day (QID) | RESPIRATORY_TRACT | Status: DC

## 2020-02-18 NOTE — Progress Notes (Signed)
PT could benefit from Social work assisting with getting help from [employer] helping to pay for home medications especially Trilogy.

## 2020-02-18 NOTE — Progress Notes (Signed)
PROGRESS NOTE    John Black  [id number] DOB: 1929/02/07 DOA: 02/16/2020 PCP: Alycia Rossetti, MD   Chief Complaint  Patient presents with  . Shortness of Breath    Brief Narrative: John Black is John Black 86 y.o. male with medical history significant of COPD/emphysema, chronic respiratory failure on home oxygen, hypothyroidism presenting with complaints of shortness of breath.  Patient reports 3 to 4-day history of progressively worsening dyspnea.  He is also wheezing but not coughing much.  Denies chest pain.  Denies fevers or chills.  States he uses 3 to 4 L supplemental oxygen at home all the time.  No additional history could be obtained from the patient due to respiratory distress.  Daughter states patient has been vaccinated for Covid.  States he does not have John Black history of blood clots.  Assessment & Plan:   Principal Problem:   COPD with acute exacerbation ([place]) Active Problems:   Hypothyroidism   BPH (benign prostatic hyperplasia)   Acute on chronic respiratory failure with hypoxia (HCC)   Sepsis (HCC)   SOB (shortness of breath)  Acute on chronic hypoxic respiratory failure, suspect secondary to acute COPD exacerbation:  CT PE protocol without PE, no acute intrathoracic pathology, advanced emphysema Continue solumedrol, plan for transition to PO tomorrow if improving Scheduled and prn nebs -> transition to xopenex with HR Ceftriaxone, azithromycin  Home theophylline Wean O2 as able  Will need home O2 screen prior to d/c - maintained O2 sats 90% on home 4 L today Negative COVID testing, follow RVP given fever  Sinus Tachycardia: unclear etiology? Possibly 2/2 nebs.  HR up to 120-130 with ambulation with therapy. - on review of previous outpatient notes, he was tachycardic to 120 on 01/22/20 visit and to 108 at 09/23/19 visit.  Daughter notes his HR usually in 80's-90's on pulse ox at home.  Will continue to monitor and switch to xopenex and see if any benefit  from this.  EKG and tele show sinus tach.  Normal TSH from admission.  Echo.  Sepsis  Febrile Illness:  With COPD exacerbation, respiratory distress, suspect he may have viral illness leading to exacerbation.  Follow RVP.  Patient is afebrile.  Tachycardic and tachypneic in the setting of respiratory distress.  Leukocytosis improved today Lactic acidosis likely related to nebs -> follow with IVF (improving) UA not c/w with UTI.  No blood or urine cx obtained on admission.  Obtain cultures if fevers Continue abx as above   Abnormal UA  Microscopic Hematuria  Mild Proteinuria: follow outpatient - he's already following with urology for gross hematuria.  Hypothyroidism: TSH normal. -Continue home Synthroid  Anxiety -Continue home Ativan as needed  GERD -Continue PPI  BPH -Continue Flomax  Anemia: suspect component of dilution, repeat H/H  DVT prophylaxis: lovenox Code Status: FNR Family Communication: daughter and wife at bedside Disposition:   Status is: Inpatient  Remains inpatient appropriate because:Inpatient level of care appropriate due to severity of illness   Dispo: The patient is from: Home              Anticipated d/c is to: Home              Anticipated d/c date is: 2 days              Patient currently is not medically stable to d/c.   Consultants:   none  Procedures:   none  Antimicrobials:  Anti-infectives (From admission, onward)   Start  Dose/Rate Route Frequency Ordered Stop   02/18/20 2000  azithromycin (ZITHROMAX) tablet 500 mg     500 mg Oral Daily 02/18/20 1003     02/17/20 2030  azithromycin (ZITHROMAX) 500 mg in sodium chloride 0.9 % 250 mL IVPB  Status:  Discontinued     500 mg 250 mL/hr over 60 Minutes Intravenous Every 24 hours 02/16/20 2255 02/18/20 1003   02/16/20 2130  cefTRIAXone (ROCEPHIN) 1 g in sodium chloride 0.9 % 100 mL IVPB     1 g 200 mL/hr over 30 Minutes Intravenous  Once 02/16/20 2021 02/16/20 2247   02/16/20  2130  cefTRIAXone (ROCEPHIN) 1 g in sodium chloride 0.9 % 100 mL IVPB     1 g 200 mL/hr over 30 Minutes Intravenous Every 24 hours 02/16/20 2255     02/16/20 2030  azithromycin (ZITHROMAX) tablet 500 mg     500 mg Oral  Once 02/16/20 2021 02/16/20 2102     Subjective: Wants to go, feels better Wants to pay bills (discussed he's got fast HR and we need to evaluate this additionally)  Objective: Vitals:   02/18/20 0715 02/18/20 0742 02/18/20 0844 02/18/20 1142  BP:    112/68  Pulse: (!) 116  (!) 103 (!) 103  Resp: (!) 26  (!) 21 20  Temp:    98.1 F (36.7 C)  TempSrc:    Oral  SpO2: 94% 99% 98% 97%  Weight:      Height:        Intake/Output Summary (Last 24 hours) at 02/18/2020 1248 Last data filed at 02/18/2020 0715 Gross per 24 hour  Intake 904.2 ml  Output 725 ml  Net 179.2 ml   Filed Weights   02/16/20 1418  Weight: 52.2 kg    Examination:  General: No acute distress. Cardiovascular: Heart sounds show Americus Perkey tachy rate, regular rhythm Lungs: wheezing improved, occasional coarse breath sounds Abdomen: Soft, nontender, nondistended Neurological: Alert and oriented 3. Moves all extremities 4 . Cranial nerves II through XII grossly intact. Skin: Warm and dry. No rashes or lesions. Extremities: No clubbing or cyanosis. No edema.      Data Reviewed: I have personally reviewed following labs and imaging studies  CBC: Recent Labs  Lab 02/16/20 1427 02/16/20 2318 02/17/20 0300 02/18/20 0311 02/18/20 1031  WBC 11.5*  --  6.0 10.1  --   NEUTROABS 10.6*  --   --  8.9*  --   HGB 14.1 PENDING 12.4* 10.9* 12.0*  HCT 44.3 40.0 38.7* 33.8* 36.9*  MCV 94.5  --  94.2 95.5  --   PLT 320  --  289 273  --     Basic Metabolic Panel: Recent Labs  Lab 02/16/20 1427 02/16/20 2318 02/18/20 0311  NA 139 137 139  K 3.7 3.8 3.4*  CL 100  --  102  CO2 27  --  25  GLUCOSE 118*  --  145*  BUN 17  --  22  CREATININE 0.97  --  0.88  CALCIUM 9.0  --  8.3*  MG  --   --  1.8    PHOS  --   --  2.3*    GFR: Estimated Creatinine Clearance: 42.8 mL/min (by C-G formula based on SCr of 0.88 mg/dL).  Liver Function Tests: Recent Labs  Lab 02/16/20 1427 02/18/20 0311  AST 24 29  ALT 20 23  ALKPHOS 46 31*  BILITOT 0.8 0.4  PROT 6.4* 4.9*  ALBUMIN 3.7 2.8*  CBG: Recent Labs  Lab 02/17/20 0302  GLUCAP 218*     Recent Results (from the past 240 hour(s))  SARS Coronavirus 2 by RT PCR (hospital order, performed in [facility] hospital lab) Nasopharyngeal Nasopharyngeal Swab     Status: None   Collection Time: 02/16/20  9:05 PM   Specimen: Nasopharyngeal Swab  Result Value Ref Range Status   SARS Coronavirus 2 NEGATIVE NEGATIVE Final    Comment: (NOTE) SARS-CoV-2 target nucleic acids are NOT DETECTED. The SARS-CoV-2 RNA is generally detectable in upper and lower respiratory specimens during the acute phase of infection. The lowest concentration of SARS-CoV-2 viral copies this assay can detect is 250 copies / mL. Nyair Depaulo negative result does not preclude SARS-CoV-2 infection and should not be used as the sole basis for treatment or other patient management decisions.  Daymion Nazaire negative result may occur with improper specimen collection / handling, submission of specimen other than nasopharyngeal swab, presence of viral mutation(s) within the areas targeted by this assay, and inadequate number of viral copies (<250 copies / mL). John Black negative result must be combined with clinical observations, patient history, and epidemiological information. Fact Sheet for Patients:   StrictlyIdeas.no Fact Sheet for Healthcare Providers: BankingDealers.co.za This test is not yet approved or cleared  by the [place] FDA and has been authorized for detection and/or diagnosis of SARS-CoV-2 by FDA under an Emergency Use Authorization (EUA).  This EUA will remain in effect (meaning this test can be used) for the duration of  the COVID-19 declaration under Section 564(b)(1) of the Act, 21 U.S.C. section 360bbb-3(b)(1), unless the authorization is terminated or revoked sooner. Performed at [facility], [place] [address] [place], [place] [address]   MRSA PCR Screening     Status: None   Collection Time: 02/17/20  3:13 AM   Specimen: Nasopharyngeal  Result Value Ref Range Status   MRSA by PCR NEGATIVE NEGATIVE Final    Comment:        The GeneXpert MRSA Assay (FDA approved for NASAL specimens only), is one component of John Black comprehensive MRSA colonization surveillance program. It is not intended to diagnose MRSA infection nor to guide or monitor treatment for MRSA infections. Performed at [facility], [place] [address], [place], [place] [address]   Respiratory Panel by PCR     Status: None   Collection Time: 02/17/20 10:05 AM   Specimen: Nasopharyngeal Swab; Respiratory  Result Value Ref Range Status   Adenovirus NOT DETECTED NOT DETECTED Final   Coronavirus 229E NOT DETECTED NOT DETECTED Final    Comment: (NOTE) The Coronavirus on the Respiratory Panel, DOES NOT test for the novel  Coronavirus (2019 nCoV)    Coronavirus HKU1 NOT DETECTED NOT DETECTED Final   Coronavirus NL63 NOT DETECTED NOT DETECTED Final   Coronavirus OC43 NOT DETECTED NOT DETECTED Final   Metapneumovirus NOT DETECTED NOT DETECTED Final   Rhinovirus / Enterovirus NOT DETECTED NOT DETECTED Final   Influenza Aidynn Polendo NOT DETECTED NOT DETECTED Final   Influenza B NOT DETECTED NOT DETECTED Final   Parainfluenza Virus 1 NOT DETECTED NOT DETECTED Final   Parainfluenza Virus 2 NOT DETECTED NOT DETECTED Final   Parainfluenza Virus 3 NOT DETECTED NOT DETECTED Final   Parainfluenza Virus 4 NOT DETECTED NOT DETECTED Final   Respiratory Syncytial Virus NOT DETECTED NOT DETECTED Final   Bordetella pertussis NOT DETECTED NOT DETECTED Final   Chlamydophila pneumoniae NOT DETECTED NOT DETECTED Final   Mycoplasma pneumoniae NOT DETECTED NOT  DETECTED Final    Comment: Performed at [facility], [place] [address], [place], [place] [address]         Radiology Studies: DG Chest 2 View  Result Date: 02/16/2020 CLINICAL DATA:  86 year old male with fever. EXAM: CHEST - 2 VIEW COMPARISON:  Chest radiograph dated 09/23/2019. chest CT dated 10/08/2019. FINDINGS: There is background of emphysema. Right upper lobe scarring with architectural distortion and traction. No consolidative changes. There is slight blunting of the right costophrenic angle which may be chronic and secondary to scarring. Trace right pleural effusion is not excluded. There is no pneumothorax. Atherosclerotic calcification of the aorta. The cardiac silhouette is within normal limits. Osteopenia. No acute osseous pathology. IMPRESSION: 1. No acute cardiopulmonary process. 2. Emphysema. 3. Right upper lobe scarring. Electronically Signed   By: Anner Crete M.D.   On: 02/16/2020 16:10   CT ANGIO CHEST PE W OR WO CONTRAST  Result Date: 02/17/2020 CLINICAL DATA:  Shortness of breath with dyspnea EXAM: CT ANGIOGRAPHY CHEST WITH CONTRAST TECHNIQUE: Multidetector CT imaging of the chest was performed using the standard protocol during bolus administration of intravenous contrast. Multiplanar CT image reconstructions and MIPs were obtained to evaluate the vascular anatomy. CONTRAST:  56mL OMNIPAQUE IOHEXOL 350 MG/ML SOLN COMPARISON:  October 08, 2019 FINDINGS: Cardiovascular: There is John Black optimal opacification of the pulmonary arteries. There is no central,segmental, or subsegmental filling defects within the pulmonary arteries. The heart is normal in size. No pericardial effusion or thickening. No evidence right heart strain. There is normal three-vessel brachiocephalic anatomy without proximal stenosis. Scattered aortic atherosclerosis is noted. There is John Black mild amount of calcification at the origin of the great vessels. Coronary artery calcifications are seen. Mediastinum/Nodes:  No hilar, mediastinal, or axillary adenopathy. Thyroid gland, trachea, and esophagus demonstrate no significant findings. Lungs/Pleura: Again noted are extensive centrilobular emphysematous changes with large bullae at both lung apices. There is architectural distortion and bronchiectasis seen at the right lung apex. Bilateral pleural calcifications are noted. No new airspace consolidation or pleural effusion. Upper Abdomen: No acute abnormalities present in the visualized portions of the upper abdomen. Musculoskeletal: No chest wall abnormality. No acute or significant osseous findings. Review of the MIP images confirms the above findings. IMPRESSION: 1. No central, segmental, or subsegmental pulmonary embolism. 2. No other acute intrathoracic pathology to explain the patient's symptoms. 3.  Aortic Atherosclerosis (ICD10-I70.0). 4. Unchanged advanced emphysema (ICD10-J43.9). Electronically Signed   By: Prudencio Pair M.D.   On: 02/17/2020 02:35        Scheduled Meds: . azithromycin  500 mg Oral Daily  . budesonide (PULMICORT) nebulizer solution  0.25 mg Nebulization BID  . Chlorhexidine Gluconate Cloth  6 each Topical Daily  . enoxaparin (LOVENOX) injection  40 mg Subcutaneous Q24H  . guaiFENesin  600 mg Oral BID  . ipratropium  2.5 mL Inhalation Q6H  . levalbuterol  0.63 mg Inhalation Q6H  . levothyroxine  75 mcg Oral Q0600  . pantoprazole  40 mg Oral Daily  . predniSONE  40 mg Oral Q breakfast  . tamsulosin  0.4 mg Oral Daily  . theophylline  400 mg Oral Daily   Continuous Infusions: . cefTRIAXone (ROCEPHIN)  IV 1 g (02/17/20 2118)     LOS: 2 days    Time spent: over 30 min    Fayrene Helper, MD [facility]   To contact the attending provider between 7A-7P or the covering provider during after hours 7P-7A, please log into the web site [email or web address]  and access using universal [place] password for that web site. If you do not have the password, please call the hospital  operator.  02/18/2020, 12:48 PM

## 2020-02-18 NOTE — Progress Notes (Signed)
Physical Therapy Treatment Patient Details Name: John Black [name]: [id number] DOB: 10-21-1928 Today's Date: 02/18/2020    History of Present Illness 86 y.o. male with medical history significant of COPD/emphysema, chronic respiratory failure on home oxygen, hypothyroidism presenting with complaints of shortness of breath.  Patient reports 3 to 4-day history of progressively worsening dyspnea. Pt uses 3-4 L of oxygen at home at all times. Pt admitted with concerns for COPD excerbation.    PT Comments    Pt making good progress.  He was able to increase gait distance with improved balance with use of RW.  Pt ambulated on 4 L [place] (his baseline) with sats 90% with talking and walking simultaneously.  HR did elevated to 120-130 bpm with walking so further exercise held.  Cont to advance mobility and independence level as able.       Follow Up Recommendations  Home health PT;Supervision for mobility/OOB     Equipment Recommendations  None recommended by PT(has DME)    Recommendations for Other Services       Precautions / Restrictions Precautions Precautions: Fall    Mobility  Bed Mobility               General bed mobility comments: oob upon arrival  Transfers Overall transfer level: Needs assistance Equipment used: Rolling walker (2 wheeled) Transfers: Sit to/from Stand Sit to Stand: Supervision         General transfer comment: cues to wait for line/lead management  Ambulation/Gait Ambulation/Gait assistance: Supervision Gait Distance (Feet): 350 Feet Assistive device: Rolling walker (2 wheeled) Gait Pattern/deviations: Step-through pattern;Decreased stride length Gait velocity: reduced   General Gait Details: min cues for RW (uses rollator at home)   Stairs             Wheelchair Mobility    Modified Rankin (Stroke Patients Only)       Balance Overall balance assessment: Needs assistance Sitting-balance support: Feet supported;No upper  extremity supported Sitting balance-Leahy Scale: Good     Standing balance support: No upper extremity supported;During functional activity Standing balance-Leahy Scale: Good                              Cognition Arousal/Alertness: Awake/alert Behavior During Therapy: WFL for tasks assessed/performed Overall Cognitive Status: Within Functional Limits for tasks assessed                                        Exercises      General Comments General comments (skin integrity, edema, etc.): Pt on 4 L [place] (his baseline).  O2 sats 94% rest and 90% ambulation.  HR 115 bpm rest and 125-130 bpm walking - held further exercise due to elevated HR>      Pertinent Vitals/Pain Pain Assessment: No/denies pain    Home Living                      Prior Function            PT Goals (current goals can now be found in the care plan section) Progress towards PT goals: Progressing toward goals    Frequency    Min 3X/week      PT Plan Current plan remains appropriate    Co-evaluation              AM-PAC PT "  6 Clicks" Mobility   Outcome Measure  Help needed turning from your back to your side while in a flat bed without using bedrails?: None Help needed moving from lying on your back to sitting on the side of a flat bed without using bedrails?: None Help needed moving to and from a bed to a chair (including a wheelchair)?: None Help needed standing up from a chair using your arms (e.g., wheelchair or bedside chair)?: None Help needed to walk in hospital room?: None Help needed climbing 3-5 steps with a railing? : A Lot 6 Click Score: 22    End of Session Equipment Utilized During Treatment: Oxygen Activity Tolerance: Patient tolerated treatment well Patient left: in chair;with call bell/phone within reach;with chair alarm set Nurse Communication: Mobility status PT Visit Diagnosis: Other (comment);Other abnormalities of gait and mobility  (R26.89);Unsteadiness on feet (R26.81)     Time: 1055-1110 PT Time Calculation (min) (ACUTE ONLY): 15 min  Charges:  $Gait Training: 8-22 mins                     John Black, PT Acute Rehab Services Pager [phone] [facility] [phone]    John Black 02/18/2020, 12:09 PM

## 2020-02-18 NOTE — TOC Initial Note (Signed)
Transition of Care [facility]) - Initial/Assessment Note    Patient Details  Name: John Black [name]: [id number] Date of Birth: [birth year]  Transition of [facility]) CM/SW Contact:    Bethann Berkshire, [place] Phone Number: 02/18/2020, 1:57 PM  Clinical Narrative:                  TOC met with pt for med assistance and  [facility] consult. Pt has already been using Trelegy Ellipta which is covered with insurance and has a copay. CSW is able to provide pt information to sign up for med assistance program through [employer]. Paper with med assistance information left in room for pt's daughter to assist with. Pt is agreeable to [facility] recommendation though requests for CSW to speak to his daughter regarding referral.   CSW contacted daughter and informed of med assistance program and [facility] recommendation. She is okay with HH rec with no preference though wishes to be main point of contact for [place]. Daughter also wants 3-in-1.   3-in-1 arranged with Zack from [employer]. [place] PT/RN/NA arranged with Brittney from [facility].    Expected Discharge Plan: [place] Barriers to Discharge: Continued Medical Work up   Patient Goals and CMS Choice        Expected Discharge Plan and Services Expected Discharge Plan: [place]       Living arrangements for the past 2 months: Single Family Home                 DME Arranged: 3-N-1 DME Agency: [employer] Date DME Agency Contacted: 02/18/20   Representative spoke with at DME Agency: [place]: PT, RN, Nurse's Aide [place] Agency: [facility] Date [facility] Agency Contacted: 02/18/20   Representative spoke with at [place]: [place]  Prior Living Arrangements/Services Living arrangements for the past 2 months: [place] with:: Adult Children, Self Patient language and need for interpreter reviewed:: Yes Do you feel safe going back to the place where you live?: Yes      Need for Family  Participation in Patient Care: Yes (Comment) Care giver support system in place?: Yes (comment) Current home services: DME([facility] and Kasandra Knudsen) Criminal Activity/Legal Involvement Pertinent to Current Situation/Hospitalization: No - Comment as needed  Activities of Daily Living Home Assistive Devices/Equipment: None ADL Screening (condition at time of admission) Patient's cognitive ability adequate to safely complete daily activities?: Yes Is the patient deaf or have difficulty hearing?: Yes Does the patient have difficulty seeing, even when wearing glasses/contacts?: Yes Patient able to express need for assistance with ADLs?: Yes Independently performs ADLs?: Yes (appropriate for developmental age) Does the patient have difficulty walking or climbing stairs?: No Weakness of Legs: Both Weakness of Arms/Hands: None  Permission Sought/Granted   Permission granted to share information with : Yes, Verbal Permission Granted  Share Information with NAME: Toscano,Shirlee (Spouse) [phone] (Mobile) and Daughter Leveda Anna           Emotional Assessment Appearance:: Appears stated age Attitude/Demeanor/Rapport: Engaged Affect (typically observed): Accepting, Pleasant Orientation: : Oriented to Self, Oriented to Place, Oriented to  Time, Oriented to Situation Alcohol / Substance Use: Not Applicable Psych Involvement: No (comment)  Admission diagnosis:  SOB (shortness of breath) [R06.02] Patient Active Problem List   Diagnosis Date Noted  . SOB (shortness of breath) 02/17/2020  . Acute on chronic respiratory failure with hypoxia ([place]) 02/16/2020  . Sepsis ([place]) 02/16/2020  . Dysphagia 07/24/2019  . Protein  calorie malnutrition ([place]) 02/24/2019  . Gait instability 01/27/2019  . Right inguinal hernia   . Chronic diarrhea 12/06/2017  . Inguinal hernia 03/02/2014  . Constipation 03/02/2014  . Chronic respiratory failure ([place]) 12/09/2013  . Hemorrhoids 09/28/2013  . Mild hyperlipidemia  06/28/2013  . Sinusitis, chronic 11/28/2012  . COPD with acute exacerbation ([place]) 11/10/2012  . BPH (benign prostatic hyperplasia) 10/22/2012  . Insomnia 10/22/2012  . Pneumonia 09/05/2012  . Hypothyroidism 06/19/2012  . Atypical nevi 06/19/2012  . Seborrheic keratosis 06/19/2012  . History of prostate cancer 11/07/2010  . Allergic rhinitis 11/07/2010  . COPD (chronic obstructive pulmonary disease) with emphysema ([place]) 11/07/2010   PCP:  Alycia Rossetti, MD Pharmacy:   [id number] [place] [id number]: [phone]: [phone]    Social Determinants of Health (SDOH) Interventions    Readmission Risk Interventions No flowsheet data found.

## 2020-02-18 NOTE — Progress Notes (Signed)
  Echocardiogram 2D Echocardiogram has been performed.  John Black 02/18/2020, 4:12 PM

## 2020-02-18 NOTE — TOC Benefit Eligibility Note (Signed)
Transition of Care [facility]) Benefit Eligibility Note    Patient Details  Name: John Black [name]: [id number] Date of Birth: 23-Jul-1929   Medication/Dose: Viviana Simpler  ELLIPTA  100-62.5-25 MCG  INHALER  Covered?: Yes  Tier: 3 Drug  Prescription Coverage [facility]  Spoke with Person/Company/Phone Number:: GENEVIEVE  @  [employer] #    [phone] opt- 2  Co-Pay: $45.00  Prior Approval: No  Deductible: Met       [place] Phone Number: 02/18/2020, 11:57 AM

## 2020-02-19 LAB — COMPREHENSIVE METABOLIC PANEL
ALT: 34 U/L (ref 0–44)
AST: 49 U/L — ABNORMAL HIGH (ref 15–41)
Albumin: 3.3 g/dL — ABNORMAL LOW (ref 3.5–5.0)
Alkaline Phosphatase: 37 U/L — ABNORMAL LOW (ref 38–126)
Anion gap: 9 (ref 5–15)
BUN: 22 mg/dL (ref 8–23)
CO2: 26 mmol/L (ref 22–32)
Calcium: 8.6 mg/dL — ABNORMAL LOW (ref 8.9–10.3)
Chloride: 100 mmol/L (ref 98–111)
Creatinine, Ser: 0.91 mg/dL (ref 0.61–1.24)
GFR calc Af Amer: >60 mL/min (ref >60)
GFR calc non Af Amer: >60 mL/min (ref >60)
Glucose, Bld: 101 mg/dL — ABNORMAL HIGH (ref 70–99)
Potassium: 4.1 mmol/L (ref 3.5–5.1)
Sodium: 135 mmol/L (ref 135–145)
Total Bilirubin: 0.6 mg/dL (ref 0.3–1.2)
Total Protein: 5.6 g/dL — ABNORMAL LOW (ref 6.5–8.1)

## 2020-02-19 LAB — CBC WITH DIFFERENTIAL/PLATELET
Abs Immature Granulocytes: 0.16 10*3/uL — ABNORMAL HIGH (ref 0.00–0.07)
Basophils Absolute: 0 10*3/uL (ref 0.0–0.1)
Basophils Relative: 0 %
Eosinophils Absolute: 0 10*3/uL (ref 0.0–0.5)
Eosinophils Relative: 0 %
HCT: 39.8 % (ref 39.0–52.0)
Hemoglobin: 12.9 g/dL — ABNORMAL LOW (ref 13.0–17.0)
Immature Granulocytes: 1 %
Lymphocytes Relative: 4 %
Lymphs Abs: 0.6 10*3/uL — ABNORMAL LOW (ref 0.7–4.0)
MCH: 30.4 pg (ref 26.0–34.0)
MCHC: 32.4 g/dL (ref 30.0–36.0)
MCV: 93.9 fL (ref 80.0–100.0)
Monocytes Absolute: 1.8 10*3/uL — ABNORMAL HIGH (ref 0.1–1.0)
Monocytes Relative: 12 %
Neutro Abs: 12.7 10*3/uL — ABNORMAL HIGH (ref 1.7–7.7)
Neutrophils Relative %: 83 %
Platelets: 338 10*3/uL (ref 150–400)
RBC: 4.24 MIL/uL (ref 4.22–5.81)
RDW: 14.1 % (ref 11.5–15.5)
WBC: 15.2 10*3/uL — ABNORMAL HIGH (ref 4.0–10.5)
nRBC: 0 % (ref 0.0–0.2)

## 2020-02-19 LAB — PHOSPHORUS: Phosphorus: 2.3 mg/dL — ABNORMAL LOW (ref 2.5–4.6)

## 2020-02-19 LAB — MAGNESIUM: Magnesium: 2 mg/dL (ref 1.7–2.4)

## 2020-02-19 MED ORDER — PREDNISONE 20 MG PO TABS: ORAL_TABLET | ORAL | 0 refills | Status: AC

## 2020-02-19 MED ORDER — AZITHROMYCIN 500 MG PO TABS: 500.0000 mg | ORAL_TABLET | Freq: Every day | ORAL | 0 refills | Status: AC

## 2020-02-19 NOTE — Progress Notes (Signed)
Discharge instructions reviewed with pt, wife and daughter  Copy of instructions given to pt, scripts sent to pt's pharmacy electronically by MD, pt/family informed Pt d/c'd via wheelchair with belongings, with family.           Escorted by unit NT

## 2020-02-19 NOTE — Discharge Summary (Signed)
Physician Discharge Summary  John Black RDE:[id number] DOB: 01-28-1929 DOA: 02/16/2020  PCP: Alycia Rossetti, MD  Admit date: 02/16/2020 Discharge date: 02/19/2020  Time spent: 40 minutes  Recommendations for Outpatient Follow-up:  1. Follow outpatient CBC/CMP  2. Follow O2 requirement - on home O2 at discharge 3. Follow tachycardia, improved prior to discharge, but mild tachycardia noted over past 2 days 4. Discharged with steroid taper and abx 5. Follow with Dr. Lamonte Sakai outpatient  6. Follow ascending aortic dilatation outpatient  7. Continue follow up of hematuria with urology 8. Follow proteinuria   Discharge Diagnoses:  Principal Problem:   COPD with acute exacerbation ([place]) Active Problems:   Hypothyroidism   BPH (benign prostatic hyperplasia)   Acute on chronic respiratory failure with hypoxia (HCC)   Sepsis (HCC)   SOB (shortness of breath)  Discharge Condition: stable  Diet recommendation: heart healthy  Filed Weights   02/16/20 1418  Weight: 52.2 kg    History of present illness:  John Black 86 y.o.malewith medical history significant ofCOPD/emphysema, chronic respiratory failure on home oxygen, hypothyroidism presenting with complaints of shortness of breath.Patient reports 3 to 4-day history of progressively worsening dyspnea. He is also wheezing but not coughing much. Denies chest pain. Denies fevers or chills. States he uses 3 to 4 L supplemental oxygen at home all the time. No additional history could be obtained from the patient due to respiratory distress. Daughter states patient has been vaccinated for Covid. States he does not have John Black history of blood clots.  He was admitted with fever and shortness of breath.  He was found to have John Black COPD exacerbation.  He's improved with steroids, abx, and scheduled and prn nebs.  6/4 appeared improved and was discharged with plan for outpatient follow up.  Hospital Course:  Acute on chronic  hypoxic respiratory failure, suspect secondary to acute COPD exacerbation: CT PE protocol without PE, no acute intrathoracic pathology, advanced emphysema Discharge with steroid taper, complete course of azithromycin, resume home nebs Ceftriaxone x3 days.  Complete 5 day total course of azithromycin.  Discharged with 2 days azithromycin.  Home theophylline Stable on home oxygen, maintained O2 sats with 4 L  Will need home O2 screen prior to d/c - maintained O2 sats 90% on home 4 L today Negative COVID testing, follow RVP negative  Sinus Tachycardia: unclear etiology? Possibly 2/2 nebs.  HR up to 120-130 with ambulation with therapy. - on review of previous outpatient notes, he was tachycardic to 120 on 01/22/20 visit and to 108 at 09/23/19 visit.  Possible chronic component.  Daughter notes his HR usually in 80's-90's on pulse ox at home.  EKG and tele show sinus tach.  Normal TSH from admission.  Echo with grade II diastolic dysfunction, normal EF (see report) -> follow outpatient.  Mild to moderate Dilatation of the ascending aorta: 39 cm on echo - follow outpatient for surveillance  Sepsis  Febrile Illness: With COPD exacerbation, respiratory distress, suspect he may have viral illness leading to exacerbation.  Follow RVP (negative).  Patient is afebrile. Tachycardic and tachypneic in the setting of respiratory distress.  Leukocytosis worsened - likely 2/2 steroids Lactic acidosis likely related to nebs -> follow with IVF (improving) UA not c/w with UTI.  No blood or urine cx obtained on admission.  Obtain cultures if fevers Continue abx as above   Abnormal UA  Microscopic Hematuria  Mild Proteinuria: follow outpatient - he's already following with urology for gross hematuria.  Hypothyroidism:TSH  normal. -Continue home Synthroid  Anxiety -Continue home Ativan as needed  GERD -Continue PPI  BPH -Continue Flomax  Anemia: suspect component of dilution, repeat  H/H  Procedures: Echo IMPRESSIONS    1. Left ventricular ejection fraction, by estimation, is 60 to 65%. The  left ventricle has normal function. The left ventricle has no regional  wall motion abnormalities. Left ventricular diastolic parameters are  consistent with Grade II diastolic  dysfunction (pseudonormalization).  2. Right ventricular systolic function is normal. The right ventricular  size is normal.  3. The mitral valve is normal in structure. Mild mitral valve  regurgitation. No evidence of mitral stenosis.  4. The aortic valve is normal in structure. Aortic valve regurgitation is  not visualized. No aortic stenosis is present.  5. Aortic dilatation noted. There is mild to moderate dilatation of the  ascending aorta measuring 39 mm.   Consultations:  none  Discharge Exam: Vitals:   02/19/20 0800 02/19/20 1221  BP: 126/82 128/85  Pulse: 94 (!) 101  Resp: (!) 22 (!) 23  Temp:  97.9 F (36.6 C)  SpO2: 94% 93%   Feeling well, ready for d/c home Breathing feels better Discussed d/c plan with daughter over phone  General: No acute distress. Cardiovascular: Heart sounds show John Black regular rate, and rhythm.  Lungs: wheezing improved, occasional cough and coarse breath sound, but good air movement Abdomen: Soft, nontender, nondistended Neurological: Alert and oriented 3. Moves all extremities 4. Cranial nerves II through XII grossly intact. Skin: Warm and dry. No rashes or lesions. Extremities: No clubbing or cyanosis. No edema.  Discharge Instructions   Discharge Instructions    Call MD for:  difficulty breathing, headache or visual disturbances   Complete by: As directed    Call MD for:  extreme fatigue   Complete by: As directed    Call MD for:  hives   Complete by: As directed    Call MD for:  persistant dizziness or light-headedness   Complete by: As directed    Call MD for:  persistant nausea and vomiting   Complete by: As directed    Call MD  for:  redness, tenderness, or signs of infection (pain, swelling, redness, odor or green/yellow discharge around incision site)   Complete by: As directed    Call MD for:  severe uncontrolled pain   Complete by: As directed    Call MD for:  temperature >100.4   Complete by: As directed    Diet - low sodium heart healthy   Complete by: As directed    Discharge instructions   Complete by: As directed    You were seen for John Black COPD exacerbation.  You've improved with steroids, antibiotics, and breathing treatments.  We'll discharge you with John Black steroid taper.  After you complete your taper, resume your 10 mg prednisone dose.  You'll also discharge with antibiotics.    Please set John Black follow up appointment with Dr. Lamonte Sakai.    Your heart rate is John Black bit fast, but it improves with rest and your workup has been reassuring.  Return for new, recurrent, or worsening symptoms.  Please ask your PCP to request records from this hospitalization so they know what was done and what the next steps will be.   For home use only DME 3 n 1   Complete by: As directed    Increase activity slowly   Complete by: As directed      Allergies as of 02/19/2020  Reactions   Morphine    REACTION: sweats      Medication List    TAKE these medications   acetaminophen 325 MG tablet Commonly known as: TYLENOL Take 650 mg by mouth every 6 (six) hours as needed for mild pain.   albuterol (2.5 MG/3ML) 0.083% nebulizer solution Commonly known as: PROVENTIL Take 3 mLs (2.5 mg total) by nebulization every 6 (six) hours as needed for wheezing or shortness of breath.   albuterol 108 (90 Base) MCG/ACT inhaler Commonly known as: VENTOLIN HFA Inhale 2 puffs into the lungs every 6 (six) hours as needed for wheezing or shortness of breath.   azithromycin 500 MG tablet Commonly known as: ZITHROMAX Take 1 tablet (500 mg total) by mouth daily for 2 days.   calcium gluconate 500 MG tablet Take 1 tablet by mouth 3 (three)  times daily.   Flutter Devi Use as directed. What changed:   how much to take  how to take this  when to take this  additional instructions   guaiFENesin 600 MG 12 hr tablet Commonly known as: MUCINEX Take 600 mg by mouth 2 (two) times daily.   levothyroxine 75 MCG tablet Commonly known as: SYNTHROID TAKE 1 TABLET (75 MCG TOTAL) BY MOUTH DAILY. What changed: See the new instructions.   LORazepam 0.5 MG tablet Commonly known as: ATIVAN TAKE 1 TABLET (0.5 MG TOTAL) BY MOUTH 2 (TWO) TIMES DAILY AS NEEDED. What changed: reasons to take this   pantoprazole 40 MG tablet Commonly known as: PROTONIX TAKE 1 TABLET BY MOUTH EVERY DAY   predniSONE 10 MG tablet Commonly known as: DELTASONE TAKE 1 TABLET (10 MG TOTAL) BY MOUTH DAILY WITH BREAKFAST. What changed: See the new instructions.   predniSONE 20 MG tablet Commonly known as: DELTASONE Take 2 tablets (40 mg total) by mouth daily with breakfast for 3 days, THEN 1.5 tablets (30 mg total) daily with breakfast for 3 days, THEN 1 tablet (20 mg total) daily with breakfast for 3 days. And then resume home 10 mg daily.. Start taking on: February 20, 2020 What changed: You were already taking Keshaun Dubey medication with the same name, and this prescription was added. Make sure you understand how and when to take each.   tamsulosin 0.4 MG Caps capsule Commonly known as: FLOMAX Take 1 capsule (0.4 mg total) by mouth daily.   theophylline 400 MG 24 hr tablet Commonly known as: UNIPHYL TAKE 1 TABLET BY MOUTH EVERY DAY   traMADol 50 MG tablet Commonly known as: ULTRAM Take 1 tablet (50 mg total) by mouth every 8 (eight) hours as needed. for pain What changed:   reasons to take this  additional instructions   Trelegy Ellipta 100-62.5-25 MCG/INH Aepb Generic drug: Fluticasone-Umeclidin-Vilant Inhale 1 puff into the lungs daily.   zolpidem 5 MG tablet Commonly known as: AMBIEN TAKE 1 TABLET (5 MG TOTAL) BY MOUTH AT BEDTIME AS NEEDED FOR  SLEEP.            Durable Medical Equipment  (From admission, onward)         Start     Ordered   02/18/20 0000  For home use only DME 3 n 1     02/18/20 1438         Allergies  Allergen Reactions  . Morphine     REACTION: sweats   Follow-up Information    [place], Modena Nunnery, MD Follow up.   Specialty: Family Medicine Contact information: [place]  [place] [address] [phone]        Collene Gobble, MD Follow up.   Specialty: Pulmonary Disease Why: Appointment: March 04, 2020 @ 10:30am Contact information: [place] [id number] [phone]            The results of significant diagnostics from this hospitalization (including imaging, microbiology, ancillary and laboratory) are listed below for reference.    Significant Diagnostic Studies: DG Chest 2 View  Result Date: 02/16/2020 CLINICAL DATA:  86 year old male with fever. EXAM: CHEST - 2 VIEW COMPARISON:  Chest radiograph dated 09/23/2019. chest CT dated 10/08/2019. FINDINGS: There is background of emphysema. Right upper lobe scarring with architectural distortion and traction. No consolidative changes. There is slight blunting of the right costophrenic angle which may be chronic and secondary to scarring. Trace right pleural effusion is not excluded. There is no pneumothorax. Atherosclerotic calcification of the aorta. The cardiac silhouette is within normal limits. Osteopenia. No acute osseous pathology. IMPRESSION: 1. No acute cardiopulmonary process. 2. Emphysema. 3. Right upper lobe scarring. Electronically Signed   By: Anner Crete M.D.   On: 02/16/2020 16:10   CT ANGIO CHEST PE W OR WO CONTRAST  Result Date: 02/17/2020 CLINICAL DATA:  Shortness of breath with dyspnea EXAM: CT ANGIOGRAPHY CHEST WITH CONTRAST TECHNIQUE: Multidetector CT imaging of the chest was performed using the standard protocol during bolus administration of intravenous contrast.  Multiplanar CT image reconstructions and MIPs were obtained to evaluate the vascular anatomy. CONTRAST:  7mL OMNIPAQUE IOHEXOL 350 MG/ML SOLN COMPARISON:  October 08, 2019 FINDINGS: Cardiovascular: There is John Black optimal opacification of the pulmonary arteries. There is no central,segmental, or subsegmental filling defects within the pulmonary arteries. The heart is normal in size. No pericardial effusion or thickening. No evidence right heart strain. There is normal three-vessel brachiocephalic anatomy without proximal stenosis. Scattered aortic atherosclerosis is noted. There is Kimya Mccahill mild amount of calcification at the origin of the great vessels. Coronary artery calcifications are seen. Mediastinum/Nodes: No hilar, mediastinal, or axillary adenopathy. Thyroid gland, trachea, and esophagus demonstrate no significant findings. Lungs/Pleura: Again noted are extensive centrilobular emphysematous changes with large bullae at both lung apices. There is architectural distortion and bronchiectasis seen at the right lung apex. Bilateral pleural calcifications are noted. No new airspace consolidation or pleural effusion. Upper Abdomen: No acute abnormalities present in the visualized portions of the upper abdomen. Musculoskeletal: No chest wall abnormality. No acute or significant osseous findings. Review of the MIP images confirms the above findings. IMPRESSION: 1. No central, segmental, or subsegmental pulmonary embolism. 2. No other acute intrathoracic pathology to explain the patient's symptoms. 3.  Aortic Atherosclerosis (ICD10-I70.0). 4. Unchanged advanced emphysema (ICD10-J43.9). Electronically Signed   By: Prudencio Pair M.D.   On: 02/17/2020 02:35   ECHOCARDIOGRAM COMPLETE  Result Date: 02/18/2020    ECHOCARDIOGRAM REPORT   Patient Name:   John Black Date of Exam: 02/18/2020 Medical Rec #:  [id number]           Height:       64.0 in Accession #:    [id number]          Weight:       115.0 lb Date of Birth:   1929-07-09           BSA:          1.546 m Patient Age:    86 years            BP:  112/68 mmHg Patient Gender: M                   HR:           102 bpm. Exam Location:  Inpatient Procedure: 2D Echo, Cardiac Doppler and Color Doppler Indications:    R00.0 Tachycardia  History:        Patient has no prior history of Echocardiogram examinations.                 Hypothyroidism. Cancer. Chronic Respiratory Failure.  Sonographer:    Tiffany Dance Referring Phys: [phone] John Black  Sonographer Comments: Technically difficult study due to poor echo windows, suboptimal parasternal window and suboptimal apical window. IMPRESSIONS  1. Left ventricular ejection fraction, by estimation, is 60 to 65%. The left ventricle has normal function. The left ventricle has no regional wall motion abnormalities. Left ventricular diastolic parameters are consistent with Grade II diastolic dysfunction (pseudonormalization).  2. Right ventricular systolic function is normal. The right ventricular size is normal.  3. The mitral valve is normal in structure. Mild mitral valve regurgitation. No evidence of mitral stenosis.  4. The aortic valve is normal in structure. Aortic valve regurgitation is not visualized. No aortic stenosis is present.  5. Aortic dilatation noted. There is mild to moderate dilatation of the ascending aorta measuring 39 mm. FINDINGS  Left Ventricle: Left ventricular ejection fraction, by estimation, is 60 to 65%. The left ventricle has normal function. The left ventricle has no regional wall motion abnormalities. The left ventricular internal cavity size was normal in size. There is  no left ventricular hypertrophy. Left ventricular diastolic parameters are consistent with Grade II diastolic dysfunction (pseudonormalization). Right Ventricle: The right ventricular size is normal. No increase in right ventricular wall thickness. Right ventricular systolic function is normal. Left Atrium: Left atrial size  was normal in size. Right Atrium: Right atrial size was normal in size. Pericardium: There is no evidence of pericardial effusion. Mitral Valve: The mitral valve is normal in structure. Mild mitral valve regurgitation. No evidence of mitral valve stenosis. Tricuspid Valve: The tricuspid valve is grossly normal. Tricuspid valve regurgitation is not demonstrated. No evidence of tricuspid stenosis. Aortic Valve: The aortic valve is normal in structure. Aortic valve regurgitation is not visualized. No aortic stenosis is present. Pulmonic Valve: The pulmonic valve was not well visualized. Pulmonic valve regurgitation is not visualized. Aorta: Aortic dilatation noted. There is mild to moderate dilatation of the ascending aorta measuring 39 mm. IAS/Shunts: The atrial septum is grossly normal.  LEFT VENTRICLE PLAX 2D LVIDd:         3.95 cm LVIDs:         2.78 cm LV PW:         1.01 cm LV IVS:        0.82 cm LVOT diam:     2.30 cm LV SV:         50 LV SV Index:   32 LVOT Area:     4.15 cm  RIGHT VENTRICLE         IVC TAPSE (M-mode): 1.9 cm  IVC diam: 2.33 cm LEFT ATRIUM         Index LA diam:    3.60 cm 2.33 cm/m  AORTIC VALVE LVOT Vmax:   57.60 cm/s LVOT Vmean:  37.600 cm/s LVOT VTI:    0.120 m  AORTA Ao Root diam: 3.90 cm MITRAL VALVE MV Area (PHT): 2.99 cm    SHUNTS  MV Decel Time: 254 msec    Systemic VTI:  0.12 m MV E velocity: 63.80 cm/s  Systemic Diam: 2.30 cm John Moores MD Electronically signed by John Moores MD Signature Date/Time: 02/18/2020/4:32:55 PM    Final     Microbiology: Recent Results (from the past 240 hour(s))  SARS Coronavirus 2 by RT PCR (hospital order, performed in [facility] hospital lab) Nasopharyngeal Nasopharyngeal Swab     Status: None   Collection Time: 02/16/20  9:05 PM   Specimen: Nasopharyngeal Swab  Result Value Ref Range Status   SARS Coronavirus 2 NEGATIVE NEGATIVE Final    Comment: (NOTE) SARS-CoV-2 target nucleic acids are NOT DETECTED. The SARS-CoV-2 RNA is generally  detectable in upper and lower respiratory specimens during the acute phase of infection. The lowest concentration of SARS-CoV-2 viral copies this assay can detect is 250 copies / mL. Kempton Milne negative result does not preclude SARS-CoV-2 infection and should not be used as the sole basis for treatment or other patient management decisions.  Khadeja Abt negative result may occur with improper specimen collection / handling, submission of specimen other than nasopharyngeal swab, presence of viral mutation(s) within the areas targeted by this assay, and inadequate number of viral copies (<250 copies / mL). Jonah Nestle negative result must be combined with clinical observations, patient history, and epidemiological information. Fact Sheet for Patients:   StrictlyIdeas.no Fact Sheet for Healthcare Providers: BankingDealers.co.za This test is not yet approved or cleared  by the [place] FDA and has been authorized for detection and/or diagnosis of SARS-CoV-2 by FDA under an Emergency Use Authorization (EUA).  This EUA will remain in effect (meaning this test can be used) for the duration of the COVID-19 declaration under Section 564(b)(1) of the Act, 21 U.S.C. section 360bbb-3(b)(1), unless the authorization is terminated or revoked sooner. Performed at [facility], [place] [address] [place], [place] [address]   MRSA PCR Screening     Status: None   Collection Time: 02/17/20  3:13 AM   Specimen: Nasopharyngeal  Result Value Ref Range Status   MRSA by PCR NEGATIVE NEGATIVE Final    Comment:        The GeneXpert MRSA Assay (FDA approved for NASAL specimens only), is one component of Antoninette Lerner comprehensive MRSA colonization surveillance program. It is not intended to diagnose MRSA infection nor to guide or monitor treatment for MRSA infections. Performed at [facility], [place] [address], [place], [place] [address]   Respiratory Panel by PCR     Status: None    Collection Time: 02/17/20 10:05 AM   Specimen: Nasopharyngeal Swab; Respiratory  Result Value Ref Range Status   Adenovirus NOT DETECTED NOT DETECTED Final   Coronavirus 229E NOT DETECTED NOT DETECTED Final    Comment: (NOTE) The Coronavirus on the Respiratory Panel, DOES NOT test for the novel  Coronavirus (2019 nCoV)    Coronavirus HKU1 NOT DETECTED NOT DETECTED Final   Coronavirus NL63 NOT DETECTED NOT DETECTED Final   Coronavirus OC43 NOT DETECTED NOT DETECTED Final   Metapneumovirus NOT DETECTED NOT DETECTED Final   Rhinovirus / Enterovirus NOT DETECTED NOT DETECTED Final   Influenza Sharifah Champine NOT DETECTED NOT DETECTED Final   Influenza B NOT DETECTED NOT DETECTED Final   Parainfluenza Virus 1 NOT DETECTED NOT DETECTED Final   Parainfluenza Virus 2 NOT DETECTED NOT DETECTED Final   Parainfluenza Virus 3 NOT DETECTED NOT DETECTED Final   Parainfluenza Virus 4 NOT DETECTED NOT DETECTED Final   Respiratory Syncytial  Virus NOT DETECTED NOT DETECTED Final   Bordetella pertussis NOT DETECTED NOT DETECTED Final   Chlamydophila pneumoniae NOT DETECTED NOT DETECTED Final   Mycoplasma pneumoniae NOT DETECTED NOT DETECTED Final    Comment: Performed at [facility], [place] [address], [place], [place] [address]  Culture, Urine     Status: None   Collection Time: 02/17/20  1:11 PM   Specimen: Urine, Random  Result Value Ref Range Status   Specimen Description URINE, RANDOM  Final   Special Requests NONE  Final   Culture   Final    NO GROWTH Performed at [facility], [place] [address], [place], [place] [address]    Report Status 02/18/2020 FINAL  Final     Labs: Basic Metabolic Panel: Recent Labs  Lab 02/16/20 1427 02/16/20 2318 02/18/20 0311 02/19/20 0319  NA 139 137 139 135  K 3.7 3.8 3.4* 4.1  CL 100  --  102 100  CO2 27  --  25 26  GLUCOSE 118*  --  145* 101*  BUN 17  --  22 22  CREATININE 0.97  --  0.88 0.91  CALCIUM 9.0  --  8.3* 8.6*  MG  --   --  1.8 2.0  PHOS   --   --  2.3* 2.3*   Liver Function Tests: Recent Labs  Lab 02/16/20 1427 02/18/20 0311 02/19/20 0319  AST 24 29 49*  ALT 20 23 34  ALKPHOS 46 31* 37*  BILITOT 0.8 0.4 0.6  PROT 6.4* 4.9* 5.6*  ALBUMIN 3.7 2.8* 3.3*   No results for input(s): LIPASE, AMYLASE in the last 168 hours. No results for input(s): AMMONIA in the last 168 hours. CBC: Recent Labs  Lab 02/16/20 1427 02/16/20 1427 02/16/20 2318 02/17/20 0300 02/18/20 0311 02/18/20 1031 02/19/20 0319  WBC 11.5*  --   --  6.0 10.1  --  15.2*  NEUTROABS 10.6*  --   --   --  8.9*  --  12.7*  HGB 14.1   < > PENDING 12.4* 10.9* 12.0* 12.9*  HCT 44.3   < > 40.0 38.7* 33.8* 36.9* 39.8  MCV 94.5  --   --  94.2 95.5  --  93.9  PLT 320  --   --  289 273  --  338   < > = values in this interval not displayed.   Cardiac Enzymes: No results for input(s): CKTOTAL, CKMB, CKMBINDEX, TROPONINI in the last 168 hours. BNP: BNP (last 3 results) Recent Labs    02/16/20 2256  BNP 72.2    ProBNP (last 3 results) No results for input(s): PROBNP in the last 8760 hours.  CBG: Recent Labs  Lab 02/17/20 0302  GLUCAP 218*       Signed:  Fayrene Helper MD.  [facility] 02/19/2020, 7:04 PM

## 2020-02-22 DIAGNOSIS — J9611 Chronic respiratory failure with hypoxia: Secondary | ICD-10-CM | POA: Diagnosis not present

## 2020-02-22 DIAGNOSIS — N401 Enlarged prostate with lower urinary tract symptoms: Secondary | ICD-10-CM | POA: Diagnosis not present

## 2020-02-22 DIAGNOSIS — D649 Anemia, unspecified: Secondary | ICD-10-CM | POA: Diagnosis not present

## 2020-02-22 DIAGNOSIS — J439 Emphysema, unspecified: Secondary | ICD-10-CM | POA: Diagnosis not present

## 2020-02-22 DIAGNOSIS — Z9181 History of falling: Secondary | ICD-10-CM | POA: Diagnosis not present

## 2020-02-22 DIAGNOSIS — K219 Gastro-esophageal reflux disease without esophagitis: Secondary | ICD-10-CM | POA: Diagnosis not present

## 2020-02-22 DIAGNOSIS — Z87891 Personal history of nicotine dependence: Secondary | ICD-10-CM | POA: Diagnosis not present

## 2020-02-22 DIAGNOSIS — N39498 Other specified urinary incontinence: Secondary | ICD-10-CM | POA: Diagnosis not present

## 2020-02-22 DIAGNOSIS — J309 Allergic rhinitis, unspecified: Secondary | ICD-10-CM | POA: Diagnosis not present

## 2020-02-22 DIAGNOSIS — E039 Hypothyroidism, unspecified: Secondary | ICD-10-CM | POA: Diagnosis not present

## 2020-02-22 DIAGNOSIS — I700 Atherosclerosis of aorta: Secondary | ICD-10-CM | POA: Diagnosis not present

## 2020-02-22 DIAGNOSIS — Z79891 Long term (current) use of opiate analgesic: Secondary | ICD-10-CM | POA: Diagnosis not present

## 2020-02-22 DIAGNOSIS — Z8701 Personal history of pneumonia (recurrent): Secondary | ICD-10-CM | POA: Diagnosis not present

## 2020-02-22 DIAGNOSIS — F419 Anxiety disorder, unspecified: Secondary | ICD-10-CM | POA: Diagnosis not present

## 2020-02-22 DIAGNOSIS — Z7952 Long term (current) use of systemic steroids: Secondary | ICD-10-CM | POA: Diagnosis not present

## 2020-02-22 DIAGNOSIS — Z8546 Personal history of malignant neoplasm of prostate: Secondary | ICD-10-CM | POA: Diagnosis not present

## 2020-02-22 DIAGNOSIS — Z7951 Long term (current) use of inhaled steroids: Secondary | ICD-10-CM | POA: Diagnosis not present

## 2020-02-23 LAB — POCT I-STAT 7, (LYTES, BLD GAS, ICA,H+H)
Acid-Base Excess: 3 mmol/L — ABNORMAL HIGH (ref 0.0–2.0)
Bicarbonate: 27.9 mmol/L — ABNORMAL HIGH (ref 20.0–24.0)
Calcium, Ion: 1.25 mmol/L (ref 1.12–1.32)
HCT: 40 % (ref 39.0–52.0)
Hemoglobin: 13.6 g/dL (ref 13.0–17.0)
O2 Saturation: 96 % (ref 90.0–100.0)
Patient temperature: 99
Potassium: 3.8 mmol/L (ref 3.5–5.1)
Sodium: 137 mmol/L (ref 135–145)
TCO2: 29 mmol/L (ref 0–100)
pCO2 arterial: 44.9 mmHg (ref 35.0–45.0)
pH, Arterial: 7.402 — ABNORMAL HIGH (ref 7.350–7.400)
pO2, Arterial: 85 mmHg (ref 83.0–108.0)

## 2020-02-25 DIAGNOSIS — J439 Emphysema, unspecified: Secondary | ICD-10-CM | POA: Diagnosis not present

## 2020-02-29 DIAGNOSIS — Z7951 Long term (current) use of inhaled steroids: Secondary | ICD-10-CM | POA: Diagnosis not present

## 2020-02-29 DIAGNOSIS — E039 Hypothyroidism, unspecified: Secondary | ICD-10-CM | POA: Diagnosis not present

## 2020-02-29 DIAGNOSIS — Z87891 Personal history of nicotine dependence: Secondary | ICD-10-CM | POA: Diagnosis not present

## 2020-02-29 DIAGNOSIS — Z8546 Personal history of malignant neoplasm of prostate: Secondary | ICD-10-CM | POA: Diagnosis not present

## 2020-02-29 DIAGNOSIS — D649 Anemia, unspecified: Secondary | ICD-10-CM | POA: Diagnosis not present

## 2020-02-29 DIAGNOSIS — J309 Allergic rhinitis, unspecified: Secondary | ICD-10-CM | POA: Diagnosis not present

## 2020-02-29 DIAGNOSIS — I700 Atherosclerosis of aorta: Secondary | ICD-10-CM | POA: Diagnosis not present

## 2020-02-29 DIAGNOSIS — J439 Emphysema, unspecified: Secondary | ICD-10-CM | POA: Diagnosis not present

## 2020-02-29 DIAGNOSIS — Z8701 Personal history of pneumonia (recurrent): Secondary | ICD-10-CM | POA: Diagnosis not present

## 2020-02-29 DIAGNOSIS — F419 Anxiety disorder, unspecified: Secondary | ICD-10-CM | POA: Diagnosis not present

## 2020-02-29 DIAGNOSIS — Z9181 History of falling: Secondary | ICD-10-CM | POA: Diagnosis not present

## 2020-02-29 DIAGNOSIS — Z79891 Long term (current) use of opiate analgesic: Secondary | ICD-10-CM | POA: Diagnosis not present

## 2020-02-29 DIAGNOSIS — K219 Gastro-esophageal reflux disease without esophagitis: Secondary | ICD-10-CM | POA: Diagnosis not present

## 2020-02-29 DIAGNOSIS — J9611 Chronic respiratory failure with hypoxia: Secondary | ICD-10-CM | POA: Diagnosis not present

## 2020-02-29 DIAGNOSIS — N39498 Other specified urinary incontinence: Secondary | ICD-10-CM | POA: Diagnosis not present

## 2020-02-29 DIAGNOSIS — N401 Enlarged prostate with lower urinary tract symptoms: Secondary | ICD-10-CM | POA: Diagnosis not present

## 2020-02-29 DIAGNOSIS — Z7952 Long term (current) use of systemic steroids: Secondary | ICD-10-CM | POA: Diagnosis not present

## 2020-03-01 ENCOUNTER — Other Ambulatory Visit: Payer: Self-pay | Admitting: Family Medicine

## 2020-03-01 ENCOUNTER — Telehealth: Payer: Self-pay | Admitting: Emergency Medicine

## 2020-03-01 ENCOUNTER — Other Ambulatory Visit: Payer: Self-pay | Admitting: Emergency Medicine

## 2020-03-01 DIAGNOSIS — J441 Chronic obstructive pulmonary disease with (acute) exacerbation: Secondary | ICD-10-CM

## 2020-03-01 DIAGNOSIS — J9621 Acute and chronic respiratory failure with hypoxia: Secondary | ICD-10-CM

## 2020-03-01 DIAGNOSIS — E039 Hypothyroidism, unspecified: Secondary | ICD-10-CM

## 2020-03-01 DIAGNOSIS — E785 Hyperlipidemia, unspecified: Secondary | ICD-10-CM

## 2020-03-01 MED ORDER — TRELEGY ELLIPTA 100-62.5-25 MCG/INH IN AEPB: 1.0000 | INHALATION_SPRAY | Freq: Every day | RESPIRATORY_TRACT | 0 refills | Status: DC

## 2020-03-01 NOTE — Chronic Care Management (AMB) (Signed)
Chronic Care Management Pharmacy  Name: John Black  [name]: [id number] DOB: 01/20/1929  Chief Complaint/ HPI  John Black,  86 y.o. , male presents for their Initial CCM visit with the clinical pharmacist via telephone.  PCP : John Rossetti, MD  Their chronic conditions include: allergic rhinitis, COPD, chronic diarrhea, hypothyroidism, BPH, mild hyperlipidemia.  Office Visits:  02/16/2020 [facility]) - O2 sat low, patient in acute respiratory failure.  EMS called to transport to hospital.  01/22/2020 [facility]) - constipation and chronic diarrhea, reduced stool softener to once daily and take this a few times per week, d/c NSAIDS and chronic prednisone.  PSA elevated and referral placed to urology, patient has history of prostate cancer.  11/11/2019 [facility]) - medicare wellness, COPD oxygen dependent, also back on Ambien 5mg  for sleep, sometimes constipated feels as if he is not emptying bowels all of the way, advised to take Miralax three times per week  Consult Visit:  02/16/2020 [facility]) - 3 to 4 day worsening dyspnea, wheezing.  Was admitted for fever and SOB with a COPD exacerbation.  Improved on steroids, abx, and nebs.  Medications: Outpatient Encounter Medications as of 03/03/2020  Medication Sig  . acetaminophen (TYLENOL) 325 MG tablet Take 650 mg by mouth every 6 (six) hours as needed for mild pain.  Marland Kitchen albuterol (PROVENTIL) (2.5 MG/3ML) 0.083% nebulizer solution Take 3 mLs (2.5 mg total) by nebulization every 6 (six) hours as needed for wheezing or shortness of breath.  Marland Kitchen albuterol (VENTOLIN HFA) 108 (90 Base) MCG/ACT inhaler Inhale 2 puffs into the lungs every 6 (six) hours as needed for wheezing or shortness of breath.  . calcium gluconate 500 MG tablet Take 1 tablet by mouth 3 (three) times daily.  . Fluticasone-Umeclidin-Vilant (TRELEGY ELLIPTA) 100-62.5-25 MCG/INH AEPB Inhale 1 puff into the lungs daily.  Marland Kitchen levothyroxine (SYNTHROID) 75 MCG tablet  TAKE 1 TABLET (75 MCG TOTAL) BY MOUTH DAILY. (Patient taking differently: Take 75 mcg by mouth daily. )  . LORazepam (ATIVAN) 0.5 MG tablet TAKE 1 TABLET (0.5 MG TOTAL) BY MOUTH 2 (TWO) TIMES DAILY AS NEEDED. (Patient taking differently: Take 0.5 mg by mouth 2 (two) times daily as needed for anxiety or sleep. )  . pantoprazole (PROTONIX) 40 MG tablet TAKE 1 TABLET BY MOUTH EVERY DAY (Patient taking differently: Take 40 mg by mouth daily. )  . predniSONE (DELTASONE) 10 MG tablet TAKE 1 TABLET (10 MG TOTAL) BY MOUTH DAILY WITH BREAKFAST. (Patient taking differently: Take 10 mg by mouth daily with breakfast. )  . Respiratory Therapy Supplies (FLUTTER) DEVI Use as directed. (Patient taking differently: 1 each by Other route as directed. )  . tamsulosin (FLOMAX) 0.4 MG CAPS capsule Take 1 capsule (0.4 mg total) by mouth daily.  . theophylline (UNIPHYL) 400 MG 24 hr tablet TAKE 1 TABLET BY MOUTH EVERY DAY (Patient taking differently: Take 400 mg by mouth daily. )  . traMADol (ULTRAM) 50 MG tablet Take 1 tablet (50 mg total) by mouth every 8 (eight) hours as needed. for pain (Patient taking differently: Take 50 mg by mouth every 8 (eight) hours as needed for moderate pain. )  . zolpidem (AMBIEN) 5 MG tablet TAKE 1 TABLET (5 MG TOTAL) BY MOUTH AT BEDTIME AS NEEDED FOR SLEEP.  Marland Kitchen Fluticasone-Umeclidin-Vilant (TRELEGY ELLIPTA) 100-62.5-25 MCG/INH AEPB Inhale 1 puff into the lungs daily. (Patient not taking: Reported on 03/03/2020)  . guaiFENesin (MUCINEX) 600 MG 12 hr tablet Take 600 mg by mouth 2 (two) times  daily. (Patient not taking: Reported on 03/03/2020)  . [DISCONTINUED] Fluticasone-Umeclidin-Vilant (TRELEGY ELLIPTA) 100-62.5-25 MCG/INH AEPB Inhale 1 puff into the lungs daily.   No facility-administered encounter medications on file as of 03/03/2020.     Current Diagnosis/Assessment:    Emergency planning/management officer Strain: Low Risk   . Difficulty of Paying Living Expenses: Not very hard     Goals Addressed             This Visit's Progress   . Pharmacy Care Plan:       CARE PLAN ENTRY  Current Barriers:  . Chronic Disease Management support, education, and care coordination needs related to COPD and Hypothyroidism   COPD . Pharmacist Clinical Goal(s) o Over the next 180 days, patient will work with PharmD and providers to optimize medication regimen and minimize symptoms related to COPD. . Current regimen:  o Ventolin HFA, Trelegy Ellipta, theophylline 400mg  XR, Prednisone 10mg  . Interventions: o Counseled on use of rescue inhaler, nebulizers o Evaluated for exacerbations and limitations of ADLs . Patient self care activities - Over the next 180 days, patient will: o Continue to focus on medication adherence and daily use of maintenance inhaler o Contact PharmD or PCP with any increase in SOB or other breathing related symptoms    Hypothyroidism . Pharmacist Clinical Goal(s) o Over the next 180 days, patient will work with PharmD and providers to optimize medication related to hypothhyroidism. . Current regimen:  o Levothyroxine 39mcg . Interventions: o Comprehensive medication review o Reviewed recent lab values for TSH o Continued current therapy . Patient self care activities - Over the next 180 days, patient will: o Continue to focus on medication adherence by pill count  Medication Assistance:  Current Barriers:  . Financial Barriers: patient has HTA insurance and reports copay for Trelegy usually becomes cost prohibitive in September when he enters the donut hole.  Pharmacist Clinical Goal(s):  Marland Kitchen Over the next 30 days, patient will work with PharmD and providers to relieve medication access concerns  Interventions: . Comprehensive medication review completed; medication list updated in electronic medical record.  Bertram Savin care team collaboration (see longitudinal plan of care) . PAP documentation by PharmD: Patient to be evaluated for out of pocket spend  criteria for this medication's patient assistance program. Reviewed application process. Patient will provide proof of income, out of pocket spend report, and will sign application. Will collaborate with primary care provider Dr. Lamonte Sakai for their portion of application. Once completed, will submit to [place] patient assistance program.   Patient Self Care Activities:  . Patient will provide necessary portions of application     Initial goal documentation        COPD    Eosinophil count:   Lab Results  Component Value Date/Time   EOSPCT 0 02/19/2020 03:19 AM  %                               Eos (Absolute):  Lab Results  Component Value Date/Time   EOSABS 0.0 02/19/2020 03:19 AM    Tobacco Status:  Social History   Tobacco Use  Smoking Status Former Smoker  . Packs/day: 1.50  . Years: 50.00  . Pack years: 75.00  . Types: Cigarettes  . Quit date: 09/17/2008  . Years since quitting: 11.4  Smokeless Tobacco Never Used    Patient has failed these meds in past: Romona Curls,  Patient is currently controlled on the following  medications: ventolin HFA, Trelegy Ellipta, prednisone 10mg , theophylline 400mg  XR Using maintenance inhaler regularly? Yes Frequency of rescue inhaler use:  infrequently  Recently out of hospital for COPD exacerbation, on long term prednisone.  Reports breathing slowly starting to come back.  Has not had to use prn albuterol much at all since returning from hospital stay.  Reports Trelegy is very expensive once he enters the donut hole around September each year.  Patient has home health coming out working with him on breathing exercises and bathing without becoming SOB.  Plan  Continue current medications, evaluate for PAP for Trelegy and send application if qualified.   Hyperlipidemia   Lipid Panel     Component Value Date/Time   CHOL 261 (H) 11/11/2019 0924   TRIG 149 11/11/2019 0924   HDL 100 11/11/2019 0924   LDLCALC 133 (H) 11/11/2019 0924      The ASCVD Risk score Mikey Bussing DC Jr., et al., 2013) failed to calculate for the following reasons:   The 2013 ASCVD risk score is only valid for ages 67 to 55   Patient has failed these meds in past: simvastatin 10mg , pravastatin 20mg  Patient is currently uncontrolled on the following medications:  . No medications at this time  We discussed:  Cholesterol elevated but stable, per Dr. Buelah Manis message after lipid panel no medications needed at this time.  Plan  Continue current lifestyle, will recheck lipid panel and determine need for statin. Hypothyroidism   Lab Results  Component Value Date/Time   TSH 1.104 02/16/2020 06:21 PM   TSH 1.59 11/11/2019 09:24 AM   TSH 1.44 01/27/2019 11:13 AM   TSH 1.96 07/17/2018 10:17 AM    Patient has failed these meds in past: none noted Patient is currently controlled on the following medications:  . Levothyroxine 72mcg  We discussed:  takes medication at appropriate time.  No concerns and reports adherence.  TSH normal.  Plan  Continue current medications  BPH    Patient has failed these meds in past: silodosin 8mg  Patient is currently controlled on the following medications: tamsulosin 0.4mg   Bladder controlled, no concerns at this time per patient.  Plan  Continue current medications   Vaccines   Reviewed and discussed patient's vaccination history.    Immunization History  Administered Date(s) Administered  . Fluad Quad(high Dose 65+) 05/13/2019  . Influenza Whole 05/30/2011, 06/17/2012  . Influenza, High Dose Seasonal PF 05/28/2017, 06/19/2018  . Influenza,inj,Quad PF,6+ Mos 05/28/2013, 06/10/2014, 06/17/2015, 06/22/2016  . PFIZER SARS-COV-2 Vaccination 10/06/2019, 10/26/2019  . Pneumococcal Conjugate-13 07/20/2014  . Pneumococcal Polysaccharide-23 06/19/2012    Plan  Recommended patient receive shingrix vaccine in office/pharmacy.   Medication Management   . Miscellaneous medications: zolpidem 5mg , tramadol  50mg , lorazepam 0.5mg ,  . OTC's: Mucinex, calcium gluconate 500mg  . Patient currently uses [facility].  Phone #  [phone] . Patient reports using pill vials lined up to organize medications and promote adherence. . Patient denies missed doses of medication.   Beverly Milch, PharmD Clinical Pharmacist [place] [phone]

## 2020-03-01 NOTE — Telephone Encounter (Signed)
Spoke with patient and verified that he is needing a sample of Trelegy 100 to get him through until Friday when he can pick up his prescription.  I spoke with the [facility] office and verified they do have a sample on hand and requested that they have it up front for the patient/wife to pick up.

## 2020-03-02 ENCOUNTER — Other Ambulatory Visit: Payer: Self-pay

## 2020-03-02 DIAGNOSIS — J9611 Chronic respiratory failure with hypoxia: Secondary | ICD-10-CM | POA: Diagnosis not present

## 2020-03-02 DIAGNOSIS — J439 Emphysema, unspecified: Secondary | ICD-10-CM | POA: Diagnosis not present

## 2020-03-02 DIAGNOSIS — Z9181 History of falling: Secondary | ICD-10-CM | POA: Diagnosis not present

## 2020-03-02 DIAGNOSIS — E039 Hypothyroidism, unspecified: Secondary | ICD-10-CM | POA: Diagnosis not present

## 2020-03-02 DIAGNOSIS — N39498 Other specified urinary incontinence: Secondary | ICD-10-CM | POA: Diagnosis not present

## 2020-03-02 DIAGNOSIS — D649 Anemia, unspecified: Secondary | ICD-10-CM | POA: Diagnosis not present

## 2020-03-02 DIAGNOSIS — Z8546 Personal history of malignant neoplasm of prostate: Secondary | ICD-10-CM | POA: Diagnosis not present

## 2020-03-02 DIAGNOSIS — Z7951 Long term (current) use of inhaled steroids: Secondary | ICD-10-CM | POA: Diagnosis not present

## 2020-03-02 DIAGNOSIS — I700 Atherosclerosis of aorta: Secondary | ICD-10-CM | POA: Diagnosis not present

## 2020-03-02 DIAGNOSIS — Z7952 Long term (current) use of systemic steroids: Secondary | ICD-10-CM | POA: Diagnosis not present

## 2020-03-02 DIAGNOSIS — Z8701 Personal history of pneumonia (recurrent): Secondary | ICD-10-CM | POA: Diagnosis not present

## 2020-03-02 DIAGNOSIS — F419 Anxiety disorder, unspecified: Secondary | ICD-10-CM | POA: Diagnosis not present

## 2020-03-02 DIAGNOSIS — Z79891 Long term (current) use of opiate analgesic: Secondary | ICD-10-CM | POA: Diagnosis not present

## 2020-03-02 DIAGNOSIS — J309 Allergic rhinitis, unspecified: Secondary | ICD-10-CM | POA: Diagnosis not present

## 2020-03-02 DIAGNOSIS — Z87891 Personal history of nicotine dependence: Secondary | ICD-10-CM | POA: Diagnosis not present

## 2020-03-02 DIAGNOSIS — K219 Gastro-esophageal reflux disease without esophagitis: Secondary | ICD-10-CM | POA: Diagnosis not present

## 2020-03-02 DIAGNOSIS — N401 Enlarged prostate with lower urinary tract symptoms: Secondary | ICD-10-CM | POA: Diagnosis not present

## 2020-03-02 MED ORDER — TRELEGY ELLIPTA 100-62.5-25 MCG/INH IN AEPB: 1.0000 | INHALATION_SPRAY | Freq: Every day | RESPIRATORY_TRACT | 1 refills | Status: DC

## 2020-03-03 ENCOUNTER — Ambulatory Visit: Payer: PPO

## 2020-03-03 ENCOUNTER — Other Ambulatory Visit: Payer: Self-pay

## 2020-03-03 DIAGNOSIS — J441 Chronic obstructive pulmonary disease with (acute) exacerbation: Secondary | ICD-10-CM

## 2020-03-03 DIAGNOSIS — E039 Hypothyroidism, unspecified: Secondary | ICD-10-CM

## 2020-03-03 NOTE — Patient Instructions (Addendum)
Visit Information Thank you for meeting with me today!  I look forward to working with you to help you meet all of your healthcare goals and answer any questions you may have.  Feel free to contact me anytime!  Goals Addressed            This Visit's Progress   . Pharmacy Care Plan:       CARE PLAN ENTRY  Current Barriers:  . Chronic Disease Management support, education, and care coordination needs related to COPD and Hypothyroidism   COPD . Pharmacist Clinical Goal(s) o Over the next 180 days, patient will work with PharmD and providers to optimize medication regimen and minimize symptoms related to COPD. . Current regimen:  o Ventolin HFA, Trelegy Ellipta, theophylline 400mg  XR, Prednisone 10mg  . Interventions: o Counseled on use of rescue inhaler, nebulizers o Evaluated for exacerbations and limitations of ADLs . Patient self care activities - Over the next 180 days, patient will: o Continue to focus on medication adherence and daily use of maintenance inhaler o Contact PharmD or PCP with any increase in SOB or other breathing related symptoms    Hypothyroidism . Pharmacist Clinical Goal(s) o Over the next 180 days, patient will work with PharmD and providers to optimize medication related to hypothhyroidism. . Current regimen:  o Levothyroxine 19mcg . Interventions: o Comprehensive medication review o Reviewed recent lab values for TSH o Continued current therapy . Patient self care activities - Over the next 180 days, patient will: o Continue to focus on medication adherence by pill count  Medication Assistance:  Current Barriers:  . Financial Barriers: patient has HTA insurance and reports copay for Trelegy usually becomes cost prohibitive in September when he enters the donut hole.  Pharmacist Clinical Goal(s):  Marland Kitchen Over the next 30 days, patient will work with PharmD and providers to relieve medication access concerns  Interventions: . Comprehensive medication  review completed; medication list updated in electronic medical record.  Bertram Savin care team collaboration (see longitudinal plan of care) . PAP documentation by PharmD: Patient to be evaluated for out of pocket spend criteria for this medication's patient assistance program. Reviewed application process. Patient will provide proof of income, out of pocket spend report, and will sign application. Will collaborate with primary care provider Dr. Lamonte Sakai for their portion of application. Once completed, will submit to [place] patient assistance program.   Patient Self Care Activities:  . Patient will provide necessary portions of application     Initial goal documentation        Mr. Clutter was given information about Chronic Care Management services today including:  1. CCM service includes personalized support from designated clinical staff supervised by his physician, including individualized plan of care and coordination with other care providers 2. 24/7 contact phone numbers for assistance for urgent and routine care needs. 3. Standard insurance, coinsurance, copays and deductibles apply for chronic care management only during months in which we provide at least 20 minutes of these services. Most insurances cover these services at 100%, however patients may be responsible for any copay, coinsurance and/or deductible if applicable. This service may help you avoid the need for more expensive face-to-face services. 4. Only one practitioner may furnish and bill the service in a calendar month. 5. The patient may stop CCM services at any time (effective at the end of the month) by phone call to the office staff.  Patient agreed to services and verbal consent obtained.   The patient verbalized  understanding of instructions provided today and agreed to receive a mailed copy of patient instruction and/or educational materials. Telephone follow up appointment with pharmacy team member  scheduled for:  Beverly Milch, PharmD Clinical Pharmacist [facility] [phone]   COPD Action Plan A COPD action plan is a description of what to do when you have a flare (exacerbation) of chronic obstructive pulmonary disease (COPD). Your action plan is a color-coded plan that lists the symptoms that indicate whether or not your condition is under control and what actions to take.  If you have symptoms in the green zone, it means you are doing well that day.  If you have symptoms in the yellow zone, it means you are having a bad day or an exacerbation.  If you have symptoms in the red zone, you need urgent medical care. Follow the plan you and your health care provider developed. Review your plan with your health care provider at each visit. Red zone Symptoms in this zone mean that you should get medical help right away. They include:  Feeling very short of breath, even when you are resting.  Not being able to do any activities because of poor breathing.  Not being able to sleep because of poor breathing.  Fever or shaking chills.  Feeling confused or very sleepy.  Chest pain.  Coughing up blood. If you have any of these symptoms, call emergency services (911 in the [place] or go to the nearest emergency room. Yellow zone Symptoms in this zone mean that your condition may be getting worse. They include:  Feeling more short of breath than usual.  Having less energy for daily activities than usual.  Phlegm or mucus that is thicker than usual.  Needing to use your rescue inhaler or nebulizer more often than usual.  More ankle swelling than usual.  Coughing more than usual.  Feeling like you have a chest cold.  Trouble sleeping due to COPD symptoms.  Decreased appetite.  COPD medicines not helping as much as usual. If you experience any "yellow" symptoms:  Keep taking your daily medicines as directed.  Use your quick-relief inhaler as  told by your health care provider.  If you were prescribed steroid medicine to take by mouth (oral medicine), start taking it as told by your health care provider.  If you were prescribed an antibiotic, start taking it as told by your health care provider. Do not stop taking the antibiotic even if you start to feel better.  Use oxygen as told by your health care provider.  Get more rest.  Do your pursed-lip breathing exercises.  Do not smoke. Avoid any irritants in the air. If your signs and symptoms do not improve after taking these steps, call your health care provider right away. Green zone Symptoms in this zone mean that you are doing well. They include:  Being able to do your usual activities and exercise.  Having the usual amount of coughing, including the same amount of phlegm or mucus.  Being able to sleep well.  Having a good appetite. Follow these instructions at home:  Continue taking your daily medicines as told by your health care provider.  Make sure you receive all the immunizations that your health care provider recommends, especially the pneumococcal and influenza vaccines.  Wash your hands often with soap and water. Have family members wash their hands too. Regular hand washing can help prevent infections.  Follow your usual exercise and diet plan.  Avoid irritants  in the air, such as smoke.  Do not use any products that contain nicotine or tobacco, such as cigarettes and e-cigarettes. If you need help quitting, ask your health care provider. Where to find more information: You can find more information about COPD from:  American Lung Association, My COPD Action Plan: SlotDealers.si.pdf  COPD Foundation: www.copdfoundation.[place]: http://cline.com/ This information is not intended to replace advice given to you by your health care provider. Make sure you  discuss any questions you have with your health care provider. Document Revised: 01/08/2018 Document Reviewed: 01/16/2017 Elsevier Patient Education  [place].

## 2020-03-04 ENCOUNTER — Ambulatory Visit (INDEPENDENT_AMBULATORY_CARE_PROVIDER_SITE_OTHER): Payer: PPO | Admitting: Acute Care

## 2020-03-04 ENCOUNTER — Encounter: Payer: Self-pay | Admitting: Acute Care

## 2020-03-04 DIAGNOSIS — J439 Emphysema, unspecified: Secondary | ICD-10-CM | POA: Diagnosis not present

## 2020-03-04 MED ORDER — TRELEGY ELLIPTA 100-62.5-25 MCG/INH IN AEPB
1.0000 | INHALATION_SPRAY | Freq: Every day | RESPIRATORY_TRACT | 0 refills | Status: DC
Start: 2020-03-04 — End: 2020-05-26

## 2020-03-04 NOTE — Progress Notes (Signed)
History of Present Illness John Black is a 86 y.o. male former smoker with COPD/ emphysema with oxygen dependent chronic respiratory failure and frequent bouts of  pneumonia. He is followed by Dr. Lamonte Sakai  Synopsis Recently admitted 02/16/2020- 02/19/2020 for COPD exacerbation. He was treated with Steroids and antibiotics,. ( Ceftriaxone x3 days.  Complete 5 day total course of azithromycin.) BD and oxygen.   Discharged with 2 days azithromycin.). He is followed by Dr. Lamonte Sakai.   03/17/2020 Hospital Follow up of hospitalization noted above. Pt. Presents for follow up. He states he complete his 2 additional days of azithromycin.  He is on home theophylline daily. He is compliant with his Trelegy and his prednisone daily.He is seeing Physical Therapy and Occupational; therapy.He feels this will help him. His family would like for him to try an incentive spirometer and flutter valve. He was discharged with instructions to use his flutter valve, but he did not receive one.He does not complain of any secretions. He states he has occasional wheeze . But his family feels it is more than occasional. He is using his Proventil nebs at least once daily Family note he is coughing when he eats, and they estimate this happens several times a week. He is in a wheel chair today. He states he has had continued weight loss despite having a good appetite. He appears very weak and deconditioned.  He is wearing his oxygen at 3-4 L [place]. He does monitor his sats. We discussed the need to maintain says > 88%, with a goal of 88-92% He denies any fevers, chest pain, orthopnea or hemoptysis.    Test Results: CTA 02/17/2020 Lungs/Pleura: Again noted are extensive centrilobular emphysematous changes with large bullae at both lung apices. There is architectural distortion and bronchiectasis seen at the right lung apex. Bilateral pleural calcifications are noted. No new airspace consolidation or pleural effusion.  No  central, segmental, or subsegmental pulmonary embolism.  No other acute intrathoracic pathology to explain the patient's symptoms.   Aortic Atherosclerosis (ICD10-I70.0).   Unchanged advanced emphysema (ICD10-J43.9).  12/2013: PFT's      CBC Latest Ref Rng & Units 03/11/2020 02/19/2020 02/18/2020  WBC 3.8 - 10.8 Thousand/uL 6.4 15.2(H) -  Hemoglobin 13.2 - 17.1 g/dL 12.9(L) 12.9(L) 12.0(L)  Hematocrit 38 - 50 % 39.7 39.8 36.9(L)  Platelets 140 - 400 Thousand/uL 289 338 -    BMP Latest Ref Rng & Units 03/11/2020 02/19/2020 02/18/2020  Glucose 65 - 99 mg/dL 213(H) 101(H) 145(H)  BUN 7 - 25 mg/dL 21 22 22   Creatinine 0.70 - 1.11 mg/dL 0.88 0.91 0.88  BUN/Creat Ratio 6 - 22 (calc) NOT APPLICABLE - -  Sodium 355 - 146 mmol/L 139 135 139  Potassium 3.5 - 5.3 mmol/L 4.7 4.1 3.4(L)  Chloride 98 - 110 mmol/L 99 100 102  CO2 20 - 32 mmol/L 27 26 25   Calcium 8.6 - 10.3 mg/dL 9.7 8.6(L) 8.3(L)    BNP    Component Value Date/Time   BNP 72.2 02/16/2020 2256    ProBNP    Component Value Date/Time   PROBNP 888.5 (H) 08/10/2013 0245    PFT    Component Value Date/Time   FEV1PRE 1.10 01/07/2014 1144   FEV1POST 1.17 01/07/2014 1144   FVCPRE 3.22 01/07/2014 1144   FVCPOST 3.22 01/07/2014 1144   TLC 7.15 01/07/2014 1144   DLCOUNC 11.56 01/07/2014 1144   PREFEV1FVCRT 34 01/07/2014 1144   PSTFEV1FVCRT 36 01/07/2014 1144    CT ANGIO CHEST PE  W OR WO CONTRAST  Result Date: 02/17/2020 CLINICAL DATA:  Shortness of breath with dyspnea EXAM: CT ANGIOGRAPHY CHEST WITH CONTRAST TECHNIQUE: Multidetector CT imaging of the chest was performed using the standard protocol during bolus administration of intravenous contrast. Multiplanar CT image reconstructions and MIPs were obtained to evaluate the vascular anatomy. CONTRAST:  31mL OMNIPAQUE IOHEXOL 350 MG/ML SOLN COMPARISON:  October 08, 2019 FINDINGS: Cardiovascular: There is a optimal opacification of the pulmonary arteries. There is no  central,segmental, or subsegmental filling defects within the pulmonary arteries. The heart is normal in size. No pericardial effusion or thickening. No evidence right heart strain. There is normal three-vessel brachiocephalic anatomy without proximal stenosis. Scattered aortic atherosclerosis is noted. There is a mild amount of calcification at the origin of the great vessels. Coronary artery calcifications are seen. Mediastinum/Nodes: No hilar, mediastinal, or axillary adenopathy. Thyroid gland, trachea, and esophagus demonstrate no significant findings. Lungs/Pleura: Again noted are extensive centrilobular emphysematous changes with large bullae at both lung apices. There is architectural distortion and bronchiectasis seen at the right lung apex. Bilateral pleural calcifications are noted. No new airspace consolidation or pleural effusion. Upper Abdomen: No acute abnormalities present in the visualized portions of the upper abdomen. Musculoskeletal: No chest wall abnormality. No acute or significant osseous findings. Review of the MIP images confirms the above findings. IMPRESSION: 1. No central, segmental, or subsegmental pulmonary embolism. 2. No other acute intrathoracic pathology to explain the patient's symptoms. 3.  Aortic Atherosclerosis (ICD10-I70.0). 4. Unchanged advanced emphysema (ICD10-J43.9). Electronically Signed   By: Prudencio Pair M.D.   On: 02/17/2020 02:35   DG Abd 2 Views  Result Date: 03/14/2020 CLINICAL DATA:  Diarrhea for several months, history of prostate cancer EXAM: ABDOMEN - 2 VIEW COMPARISON:  12/06/2017, 02/04/2020 FINDINGS: Supine and upright frontal views of the abdomen and pelvis are obtained. Bowel gas pattern is unremarkable with no obstruction or ileus. 4 mm calcification projects over the upper pole right kidney consistent with a vascular calcifications seen on prior CT. The punctate calculus within the lower pole right kidney on prior CT is not well visualized by  radiograph. Extensive vascular calcifications are seen within the pelvis. No free gas within the greater peritoneal sac. The right inguinal hernia seen on prior study is not identified on this exam due to collimation. Lung bases are clear. IMPRESSION: 1. Stable vascular calcifications. No definite radiopaque urinary tract calculi. 2. Unremarkable bowel gas pattern. Electronically Signed   By: Randa Ngo M.D.   On: 03/14/2020 21:44   ECHOCARDIOGRAM COMPLETE  Result Date: 02/18/2020    ECHOCARDIOGRAM REPORT   Patient Name:   John Black Date of Exam: 02/18/2020 Medical Rec #:  [id number]           Height:       64.0 in Accession #:    [id number]          Weight:       115.0 lb Date of Birth:  August 21, 1929           BSA:          1.546 m Patient Age:    61 years            BP:           112/68 mmHg Patient Gender: M                   HR:           102 bpm. Exam Location:  Inpatient Procedure: 2D Echo, Cardiac Doppler and Color Doppler Indications:    R00.0 Tachycardia  History:        Patient has no prior history of Echocardiogram examinations.                 Hypothyroidism. Cancer. Chronic Respiratory Failure.  Sonographer:    Tiffany Dance Referring Phys: [phone] A CALDWELL POWELL JR  Sonographer Comments: Technically difficult study due to poor echo windows, suboptimal parasternal window and suboptimal apical window. IMPRESSIONS  1. Left ventricular ejection fraction, by estimation, is 60 to 65%. The left ventricle has normal function. The left ventricle has no regional wall motion abnormalities. Left ventricular diastolic parameters are consistent with Grade II diastolic dysfunction (pseudonormalization).  2. Right ventricular systolic function is normal. The right ventricular size is normal.  3. The mitral valve is normal in structure. Mild mitral valve regurgitation. No evidence of mitral stenosis.  4. The aortic valve is normal in structure. Aortic valve regurgitation is not visualized. No aortic  stenosis is present.  5. Aortic dilatation noted. There is mild to moderate dilatation of the ascending aorta measuring 39 mm. FINDINGS  Left Ventricle: Left ventricular ejection fraction, by estimation, is 60 to 65%. The left ventricle has normal function. The left ventricle has no regional wall motion abnormalities. The left ventricular internal cavity size was normal in size. There is  no left ventricular hypertrophy. Left ventricular diastolic parameters are consistent with Grade II diastolic dysfunction (pseudonormalization). Right Ventricle: The right ventricular size is normal. No increase in right ventricular wall thickness. Right ventricular systolic function is normal. Left Atrium: Left atrial size was normal in size. Right Atrium: Right atrial size was normal in size. Pericardium: There is no evidence of pericardial effusion. Mitral Valve: The mitral valve is normal in structure. Mild mitral valve regurgitation. No evidence of mitral valve stenosis. Tricuspid Valve: The tricuspid valve is grossly normal. Tricuspid valve regurgitation is not demonstrated. No evidence of tricuspid stenosis. Aortic Valve: The aortic valve is normal in structure. Aortic valve regurgitation is not visualized. No aortic stenosis is present. Pulmonic Valve: The pulmonic valve was not well visualized. Pulmonic valve regurgitation is not visualized. Aorta: Aortic dilatation noted. There is mild to moderate dilatation of the ascending aorta measuring 39 mm. IAS/Shunts: The atrial septum is grossly normal.  LEFT VENTRICLE PLAX 2D LVIDd:         3.95 cm LVIDs:         2.78 cm LV PW:         1.01 cm LV IVS:        0.82 cm LVOT diam:     2.30 cm LV SV:         50 LV SV Index:   32 LVOT Area:     4.15 cm  RIGHT VENTRICLE         IVC TAPSE (M-mode): 1.9 cm  IVC diam: 2.33 cm LEFT ATRIUM         Index LA diam:    3.60 cm 2.33 cm/m  AORTIC VALVE LVOT Vmax:   57.60 cm/s LVOT Vmean:  37.600 cm/s LVOT VTI:    0.120 m  AORTA Ao Root diam:  3.90 cm MITRAL VALVE MV Area (PHT): 2.99 cm    SHUNTS MV Decel Time: 254 msec    Systemic VTI:  0.12 m MV E velocity: 63.80 cm/s  Systemic Diam: 2.30 cm Mertie Moores MD Electronically signed by Mertie Moores MD Signature Date/Time: 02/18/2020/4:32:55 PM  Final      Past medical hx Past Medical History:  Diagnosis Date  . Allergic rhinitis   . Cataract    s/p removal  . Chronic respiratory failure (HCC)    oxygen 3L at home  . Emphysema   . Hypothyroid   . PNA (pneumonia)   . Prostate cancer [facility])      Social History   Tobacco Use  . Smoking status: Former Smoker    Packs/day: 1.50    Years: 50.00    Pack years: 75.00    Types: Cigarettes    Quit date: 09/17/2008    Years since quitting: 11.5  . Smokeless tobacco: Never Used  Vaping Use  . Vaping Use: Never used  Substance Use Topics  . Alcohol use: Yes    Alcohol/week: 1.0 standard drink    Types: 1 Glasses of wine per week    Comment: each evening  . Drug use: No    John Black reports that he quit smoking about 11 years ago. His smoking use included cigarettes. He has a 75.00 pack-year smoking history. He has never used smokeless tobacco. He reports current alcohol use of about 1.0 standard drink of alcohol per week. He reports that he does not use drugs.  Tobacco Cessation: Counseling given: Not Answered   Past surgical hx, Family hx, Social hx all reviewed.  Current Outpatient Medications on File Prior to Visit  Medication Sig  . acetaminophen (TYLENOL) 325 MG tablet Take 650 mg by mouth every 6 (six) hours as needed for mild pain.  Marland Kitchen albuterol (PROVENTIL) (2.5 MG/3ML) 0.083% nebulizer solution Take 3 mLs (2.5 mg total) by nebulization every 6 (six) hours as needed for wheezing or shortness of breath.  Marland Kitchen albuterol (VENTOLIN HFA) 108 (90 Base) MCG/ACT inhaler Inhale 2 puffs into the lungs every 6 (six) hours as needed for wheezing or shortness of breath.  . calcium gluconate 500 MG tablet Take 1 tablet by mouth  3 (three) times daily.  . Fluticasone-Umeclidin-Vilant (TRELEGY ELLIPTA) 100-62.5-25 MCG/INH AEPB Inhale 1 puff into the lungs daily.  . Fluticasone-Umeclidin-Vilant (TRELEGY ELLIPTA) 100-62.5-25 MCG/INH AEPB Inhale 1 puff into the lungs daily.  Marland Kitchen guaiFENesin (MUCINEX) 600 MG 12 hr tablet Take 600 mg by mouth 2 (two) times daily.   Marland Kitchen levothyroxine (SYNTHROID) 75 MCG tablet TAKE 1 TABLET (75 MCG TOTAL) BY MOUTH DAILY. (Patient taking differently: Take 75 mcg by mouth daily. )  . LORazepam (ATIVAN) 0.5 MG tablet TAKE 1 TABLET (0.5 MG TOTAL) BY MOUTH 2 (TWO) TIMES DAILY AS NEEDED. (Patient taking differently: Take 0.5 mg by mouth 2 (two) times daily as needed for anxiety or sleep. )  . pantoprazole (PROTONIX) 40 MG tablet TAKE 1 TABLET BY MOUTH EVERY DAY (Patient taking differently: Take 40 mg by mouth daily. )  . predniSONE (DELTASONE) 10 MG tablet TAKE 1 TABLET (10 MG TOTAL) BY MOUTH DAILY WITH BREAKFAST. (Patient taking differently: Take 10 mg by mouth daily with breakfast. )  . Respiratory Therapy Supplies (FLUTTER) DEVI Use as directed. (Patient taking differently: 1 each by Other route as directed. )  . tamsulosin (FLOMAX) 0.4 MG CAPS capsule Take 1 capsule (0.4 mg total) by mouth daily.  . theophylline (UNIPHYL) 400 MG 24 hr tablet TAKE 1 TABLET BY MOUTH EVERY DAY (Patient taking differently: Take 400 mg by mouth daily. )  . traMADol (ULTRAM) 50 MG tablet Take 1 tablet (50 mg total) by mouth every 8 (eight) hours as needed. for pain (Patient taking differently: Take  50 mg by mouth every 8 (eight) hours as needed for moderate pain. )  . zolpidem (AMBIEN) 5 MG tablet TAKE 1 TABLET (5 MG TOTAL) BY MOUTH AT BEDTIME AS NEEDED FOR SLEEP.   No current facility-administered medications on file prior to visit.     Allergies  Allergen Reactions  . Morphine     REACTION: sweats    Review Of Systems:  Constitutional:   No  weight loss, night sweats,  Fevers, chills, + fatigue, or   lassitude.  HEENT:   No headaches,  Difficulty swallowing,  Tooth/dental problems, or  Sore throat,                No sneezing, itching, ear ache, nasal congestion, post nasal drip,   CV:  No chest pain,  Orthopnea, PND, swelling in lower extremities, anasarca, dizziness, palpitations, syncope.   GI  No heartburn, indigestion, abdominal pain, nausea, vomiting, diarrhea, change in bowel habits, loss of appetite, bloody stools.   Resp: + shortness of breath with exertion or at rest.  + excess mucus, + productive cough,  No non-productive cough,  No coughing up of blood.  No change in color of mucus.  No wheezing.  No chest wall deformity  Skin: no rash or lesions.  GU: no dysuria, change in color of urine, no urgency or frequency.  No flank pain, no hematuria   MS:  No joint pain or swelling.  No decreased range of motion.  No back pain.  Psych:  No change in mood or affect. No depression or anxiety.  No memory loss.   Vital Signs BP 128/62 (BP Location: Right Arm, Cuff Size: Normal)   Pulse 76   Temp 98.2 F (36.8 C) (Oral)   Ht 5\' 5"  (1.651 m)   Wt 113 lb 12.8 oz (51.6 kg)   SpO2 94%   BMI 18.94 kg/m    Physical Exam:  General- No distress,  A&Ox3, pleasant ENT: No sinus tenderness, TM clear, pale nasal mucosa, no oral exudate,no post nasal drip, no LAN Cardiac: S1, S2, regular rate and rhythm, no murmur Chest: No wheeze/ rales/ dullness; no accessory muscle use, no nasal flaring, no sternal retractions, very distant heart sounds, diminished per bases Abd.: Soft Non-tender, ND, BS +, very thin Ext: No clubbing cyanosis, trace BLE edema Neuro:  Severe deconditioning Skin: No rashes, No lesions, warm and dry Psych: normal mood and behavior   Assessment/Plan Resolving  COPD exacerbation Hospitalized 02/16/2020- 02/19/2020 Compliant with medication regiment Plan We will give you a flutter valve today Use 2 puffs twice daily. You can order an Chiropodist on  [employer]. Use once an hour while watching tv.  Watch [employer] for instructions We will place an  order for dietitian for weight gain Continue theophylline, Trelegy, Proventil nebs, protonix, prednisone, and  Mucinex Continue physical therapy and occupational therapy. Follow up in [place] office in 1 month ( Has agreed to try an MD there as it will be more convenient) Note your daily symptoms > remember "red flags" for COPD:  Increase in cough, increase in sputum production, increase in shortness of breath or activity intolerance. If you notice these symptoms, please call to be seen.    ? Aspiration per family Plan We will order a Swallow Eval per Speech Therapy to see if you are aspirating Take time when  you are eating to eat slowly Drink with meals to keep your mouth moist  Weight loss despite good appetite Plan Add protein  as able  Monitor weight Refer to dietitian for weight gain options  Chronic hypoxic Respiratory Failure Plan Continue wearing oxygen ar 3 L with rest and 4 L with ambulation Saturation goals are 88-92%  Follow up in 1 month at [place] office to ensure you are continuing to do well. Please contact office for sooner follow up if symptoms do not improve or worsen or seek emergency care  Call if you need [place] sooner  This appointment was 40 min long with over 50% of the time in direct face-to-face patient care, assessment, plan of care, and follow-up.   Magdalen Spatz, NP 03/04/2020

## 2020-03-04 NOTE — Patient Instructions (Addendum)
It is good to see you today. We will give you a flutter valve today Use 2 puffs twice daily. You can order an Chiropodist on [employer]. Use once an hour while watching tv.  Watch [employer] for instructions We will place an  order for dietitian for weight gain Continue theophylline, Trelegy, Proventil nebs, protonix, prednisone, and  Mucinex Continue physical therapy and occupational therapy. Follow up in [place] office in 1 month ( Has agreed to try an MD there as it will be more convenient) Swallow Study to evaluate for aspiration. Note your daily symptoms > remember "red flags" for COPD:  Increase in cough, increase in sputum production, increase in shortness of breath or activity intolerance. If you notice these symptoms, please call to be seen.   Please contact office for sooner follow up if symptoms do not improve or worsen or seek emergency care

## 2020-03-08 DIAGNOSIS — K219 Gastro-esophageal reflux disease without esophagitis: Secondary | ICD-10-CM | POA: Diagnosis not present

## 2020-03-08 DIAGNOSIS — F419 Anxiety disorder, unspecified: Secondary | ICD-10-CM | POA: Diagnosis not present

## 2020-03-08 DIAGNOSIS — I700 Atherosclerosis of aorta: Secondary | ICD-10-CM | POA: Diagnosis not present

## 2020-03-08 DIAGNOSIS — Z8546 Personal history of malignant neoplasm of prostate: Secondary | ICD-10-CM | POA: Diagnosis not present

## 2020-03-08 DIAGNOSIS — D649 Anemia, unspecified: Secondary | ICD-10-CM | POA: Diagnosis not present

## 2020-03-08 DIAGNOSIS — Z9181 History of falling: Secondary | ICD-10-CM | POA: Diagnosis not present

## 2020-03-08 DIAGNOSIS — Z87891 Personal history of nicotine dependence: Secondary | ICD-10-CM | POA: Diagnosis not present

## 2020-03-08 DIAGNOSIS — N39498 Other specified urinary incontinence: Secondary | ICD-10-CM | POA: Diagnosis not present

## 2020-03-08 DIAGNOSIS — J309 Allergic rhinitis, unspecified: Secondary | ICD-10-CM | POA: Diagnosis not present

## 2020-03-08 DIAGNOSIS — N401 Enlarged prostate with lower urinary tract symptoms: Secondary | ICD-10-CM | POA: Diagnosis not present

## 2020-03-08 DIAGNOSIS — J9611 Chronic respiratory failure with hypoxia: Secondary | ICD-10-CM | POA: Diagnosis not present

## 2020-03-08 DIAGNOSIS — Z8701 Personal history of pneumonia (recurrent): Secondary | ICD-10-CM | POA: Diagnosis not present

## 2020-03-08 DIAGNOSIS — Z7951 Long term (current) use of inhaled steroids: Secondary | ICD-10-CM | POA: Diagnosis not present

## 2020-03-08 DIAGNOSIS — J439 Emphysema, unspecified: Secondary | ICD-10-CM | POA: Diagnosis not present

## 2020-03-08 DIAGNOSIS — Z79891 Long term (current) use of opiate analgesic: Secondary | ICD-10-CM | POA: Diagnosis not present

## 2020-03-08 DIAGNOSIS — E039 Hypothyroidism, unspecified: Secondary | ICD-10-CM | POA: Diagnosis not present

## 2020-03-08 DIAGNOSIS — Z7952 Long term (current) use of systemic steroids: Secondary | ICD-10-CM | POA: Diagnosis not present

## 2020-03-10 DIAGNOSIS — Z9181 History of falling: Secondary | ICD-10-CM | POA: Diagnosis not present

## 2020-03-10 DIAGNOSIS — Z7951 Long term (current) use of inhaled steroids: Secondary | ICD-10-CM | POA: Diagnosis not present

## 2020-03-10 DIAGNOSIS — N39498 Other specified urinary incontinence: Secondary | ICD-10-CM | POA: Diagnosis not present

## 2020-03-10 DIAGNOSIS — Z87891 Personal history of nicotine dependence: Secondary | ICD-10-CM | POA: Diagnosis not present

## 2020-03-10 DIAGNOSIS — D649 Anemia, unspecified: Secondary | ICD-10-CM | POA: Diagnosis not present

## 2020-03-10 DIAGNOSIS — N401 Enlarged prostate with lower urinary tract symptoms: Secondary | ICD-10-CM | POA: Diagnosis not present

## 2020-03-10 DIAGNOSIS — J9611 Chronic respiratory failure with hypoxia: Secondary | ICD-10-CM | POA: Diagnosis not present

## 2020-03-10 DIAGNOSIS — J309 Allergic rhinitis, unspecified: Secondary | ICD-10-CM | POA: Diagnosis not present

## 2020-03-10 DIAGNOSIS — F419 Anxiety disorder, unspecified: Secondary | ICD-10-CM | POA: Diagnosis not present

## 2020-03-10 DIAGNOSIS — I700 Atherosclerosis of aorta: Secondary | ICD-10-CM | POA: Diagnosis not present

## 2020-03-10 DIAGNOSIS — Z8701 Personal history of pneumonia (recurrent): Secondary | ICD-10-CM | POA: Diagnosis not present

## 2020-03-10 DIAGNOSIS — E039 Hypothyroidism, unspecified: Secondary | ICD-10-CM | POA: Diagnosis not present

## 2020-03-10 DIAGNOSIS — J439 Emphysema, unspecified: Secondary | ICD-10-CM | POA: Diagnosis not present

## 2020-03-10 DIAGNOSIS — Z7952 Long term (current) use of systemic steroids: Secondary | ICD-10-CM | POA: Diagnosis not present

## 2020-03-10 DIAGNOSIS — Z8546 Personal history of malignant neoplasm of prostate: Secondary | ICD-10-CM | POA: Diagnosis not present

## 2020-03-10 DIAGNOSIS — Z79891 Long term (current) use of opiate analgesic: Secondary | ICD-10-CM | POA: Diagnosis not present

## 2020-03-10 DIAGNOSIS — K219 Gastro-esophageal reflux disease without esophagitis: Secondary | ICD-10-CM | POA: Diagnosis not present

## 2020-03-11 ENCOUNTER — Ambulatory Visit (INDEPENDENT_AMBULATORY_CARE_PROVIDER_SITE_OTHER): Payer: PPO | Admitting: Family Medicine

## 2020-03-11 ENCOUNTER — Ambulatory Visit: Payer: PPO | Admitting: Family Medicine

## 2020-03-11 ENCOUNTER — Encounter: Payer: Self-pay | Admitting: Family Medicine

## 2020-03-11 ENCOUNTER — Other Ambulatory Visit: Payer: Self-pay

## 2020-03-11 VITALS — BP 126/58 | HR 110 | Temp 98.1°F | Resp 18

## 2020-03-11 DIAGNOSIS — E440 Moderate protein-calorie malnutrition: Secondary | ICD-10-CM

## 2020-03-11 DIAGNOSIS — R7309 Other abnormal glucose: Secondary | ICD-10-CM | POA: Diagnosis not present

## 2020-03-11 DIAGNOSIS — K5900 Constipation, unspecified: Secondary | ICD-10-CM

## 2020-03-11 DIAGNOSIS — J438 Other emphysema: Secondary | ICD-10-CM

## 2020-03-11 DIAGNOSIS — E039 Hypothyroidism, unspecified: Secondary | ICD-10-CM | POA: Diagnosis not present

## 2020-03-11 DIAGNOSIS — R2681 Unsteadiness on feet: Secondary | ICD-10-CM

## 2020-03-11 DIAGNOSIS — R000 Tachycardia, unspecified: Secondary | ICD-10-CM

## 2020-03-11 DIAGNOSIS — I77810 Thoracic aortic ectasia: Secondary | ICD-10-CM | POA: Diagnosis not present

## 2020-03-11 DIAGNOSIS — R1310 Dysphagia, unspecified: Secondary | ICD-10-CM | POA: Diagnosis not present

## 2020-03-11 DIAGNOSIS — J9611 Chronic respiratory failure with hypoxia: Secondary | ICD-10-CM | POA: Diagnosis not present

## 2020-03-11 NOTE — Patient Instructions (Addendum)
Referral to cardiology for consult  We will call with lab results  Swallowing evaluation  Wheelchair to ordered Xray of abdomen  I will cancel Urology appointment  F/U 4 months

## 2020-03-11 NOTE — Progress Notes (Signed)
Subjective:    Patient ID: John Black, male    DOB: 1928-10-18, 86 y.o.   [name]: [id number]  Patient presents for Follow-up (is not fasting) and Face to Face (wheelchair) Patient here for hospital follow-up.  Her last visit a few weeks ago I had an EMS call to transfer him from the clinic to the hospital secondary to progressive weakness concern for sepsis in the setting of his acute on chronic respiratory failure. He was treated for COPD exacerbation while inpatient.  Completed his antibiotics and was initially on high-dose steroids now down to his baseline daily's maintenance steroid.  He still on 4 L of oxygen.  His Covid testing did come back negative. He did have persistent tachycardia while he was in the hospital he thought that this was multifactorial between his COPD as well as his nebulizer treatments.  His thyroid was normal.  Echo was done which did show dilatation of the ascending aorta was recommended that he follow-up with cardiology as an outpatient. He has seen his pulmonologist since discharge from the hospital and there were no changes made to his regimen.   Using spirometer-flutter vlve daily     His pulmonlogist was concerned about aspiration with eating, he often has harsh coughing episodes while eating, they tried to get speech but there was some type of insurance issue. He at times gasps for air when eating as well.   Microscopic hematuria he was evaluated by urology recently.  He does have history of prostate cancer.  On review of medication from neurologist.  The next step is going to be CT imaging of the bladder as well as cystoscopy He has not had any further bleeding, does not want to proceed with the further workup in his current state of healt    Deconditioning generalized weakness.  He is currently getting home physical therapy and Occupational Therapy.  Labs reviewed he had mildly elevated AST 49 at discharge calcium was low at 8.6 protein low at 5.6  albumin low at 3.3 CBC White blood cells 15.2 hemoglobin 12.9  Phosphorus low at 2.3  Intermittant diarrhea/constipation still persistent,. We had dicussed in May only giving 1 stool softner  Now he is taking immodium, if he passes gas small amount of stool will leak, he does not have full bowel movements    Needs wheelchair to assist with mobility, he is only able to walk short distances due to his respiratory distress and weakness   Review Of Systems:  GEN- denies fatigue, fever,+ weight loss+,weakness, recent illness HEENT- denies eye drainage, change in vision, nasal discharge, CVS- denies chest pain, palpitations RESP- + SOB, cough, wheeze ABD- denies N/V,+ chronic change in stools, abd pain GU- denies dysuria, hematuria, dribbling, + stool incontinence MSK- denies joint pain, muscle aches, injury Neuro- denies headache, dizziness, syncope, seizure activity       Objective:    BP (!) 126/58   Pulse (!) 110   Temp 98.1 F (36.7 C) (Temporal)   Resp 18   SpO2 93%  GEN- NAD, alert and oriented x3,sitting in wheelchair , chronically ill appearing  HEENT- PERRL, EOMI, non injected sclera, pink conjunctiva, Dry MM oropharynx clear Neck- Supple, no  LAD  CVS- tachycardic, RR , distant HS, no murmur  RESP-decreased at bases, no wheeze, 4L oxygen  ABD-NABS,soft,NT,ND EXT- No edema Pulses- Radial  2+      Assessment & Plan:      Problem List Items Addressed This Visit  Unprioritized   Chronic respiratory failure (HCC)    Medications per pulmonary Oxygen therapy continued   Concern for aspiration, will obtain modified barium swallow with speech and give dietary recommendations based on results       Relevant Orders   SLP modified barium swallow   Constipation    I think we are dealing with constipation and he is leaking stool, when he passes gas Obtain xray of abdomen, recommend holding on immodium for now       Relevant Orders   DG Abd 2 Views   COPD  (chronic obstructive pulmonary disease) with emphysema ([place]) - Primary   Relevant Orders   CBC with Differential/Platelet (Completed)   Comprehensive metabolic panel (Completed)   Gait instability    Standard Wheelchair needed, to assist with ADL's and mobility       Hypothyroidism   Protein calorie malnutrition ([place])    He is eating as much as he tolerates Trying to add protein      Relevant Orders   CBC with Differential/Platelet (Completed)   Comprehensive metabolic panel (Completed)   SLP modified barium swallow    Other Visit Diagnoses    Aortic root dilatation [facility])       Cardiology evaluation, I think the tachycardia is due to his pulmonary disease and the taxing efforts when he exerts himself even the slightest, HR came down to 100-110 just sitting    Relevant Orders   Ambulatory referral to Cardiology   Tachycardia       Relevant Orders   Ambulatory referral to Cardiology   Low phosphate levels       Relevant Orders   Phosphorus (Completed)   Swallowing dysfunction       Relevant Orders   SLP modified barium swallow      Note: This dictation was prepared with Dragon dictation along with smaller phrase technology. Any transcriptional errors that result from this process are unintentional.

## 2020-03-12 LAB — PHOSPHORUS: Phosphorus: 3.2 mg/dL (ref 2.1–4.3)

## 2020-03-13 NOTE — Assessment & Plan Note (Signed)
I think we are dealing with constipation and he is leaking stool, when he passes gas Obtain xray of abdomen, recommend holding on immodium for now

## 2020-03-13 NOTE — Assessment & Plan Note (Signed)
He is eating as much as he tolerates Trying to add protein

## 2020-03-13 NOTE — Assessment & Plan Note (Signed)
Standard Wheelchair needed, to assist with ADL's and mobility

## 2020-03-13 NOTE — Assessment & Plan Note (Signed)
Medications per pulmonary Oxygen therapy continued   Concern for aspiration, will obtain modified barium swallow with speech and give dietary recommendations based on results

## 2020-03-14 ENCOUNTER — Ambulatory Visit (HOSPITAL_COMMUNITY)
Admission: RE | Admit: 2020-03-14 | Discharge: 2020-03-14 | Disposition: A | Discharge status: Home / Self Care | Payer: PPO | Source: Ambulatory Visit | Attending: Family Medicine | Admitting: Family Medicine

## 2020-03-14 ENCOUNTER — Other Ambulatory Visit: Payer: Self-pay

## 2020-03-14 ENCOUNTER — Telehealth: Payer: Self-pay | Admitting: *Deleted

## 2020-03-14 DIAGNOSIS — K5900 Constipation, unspecified: Secondary | ICD-10-CM | POA: Diagnosis not present

## 2020-03-14 DIAGNOSIS — R197 Diarrhea, unspecified: Secondary | ICD-10-CM | POA: Diagnosis not present

## 2020-03-14 IMAGING — DX DG ABDOMEN 2V
2 series · 2 of 2 positions shown · non-contrast
Comparison: [DATE], [DATE]

CLINICAL DATA: Diarrhea for several months, history of prostate
cancer

EXAM:
ABDOMEN - 2 VIEW

[abdomen erect]
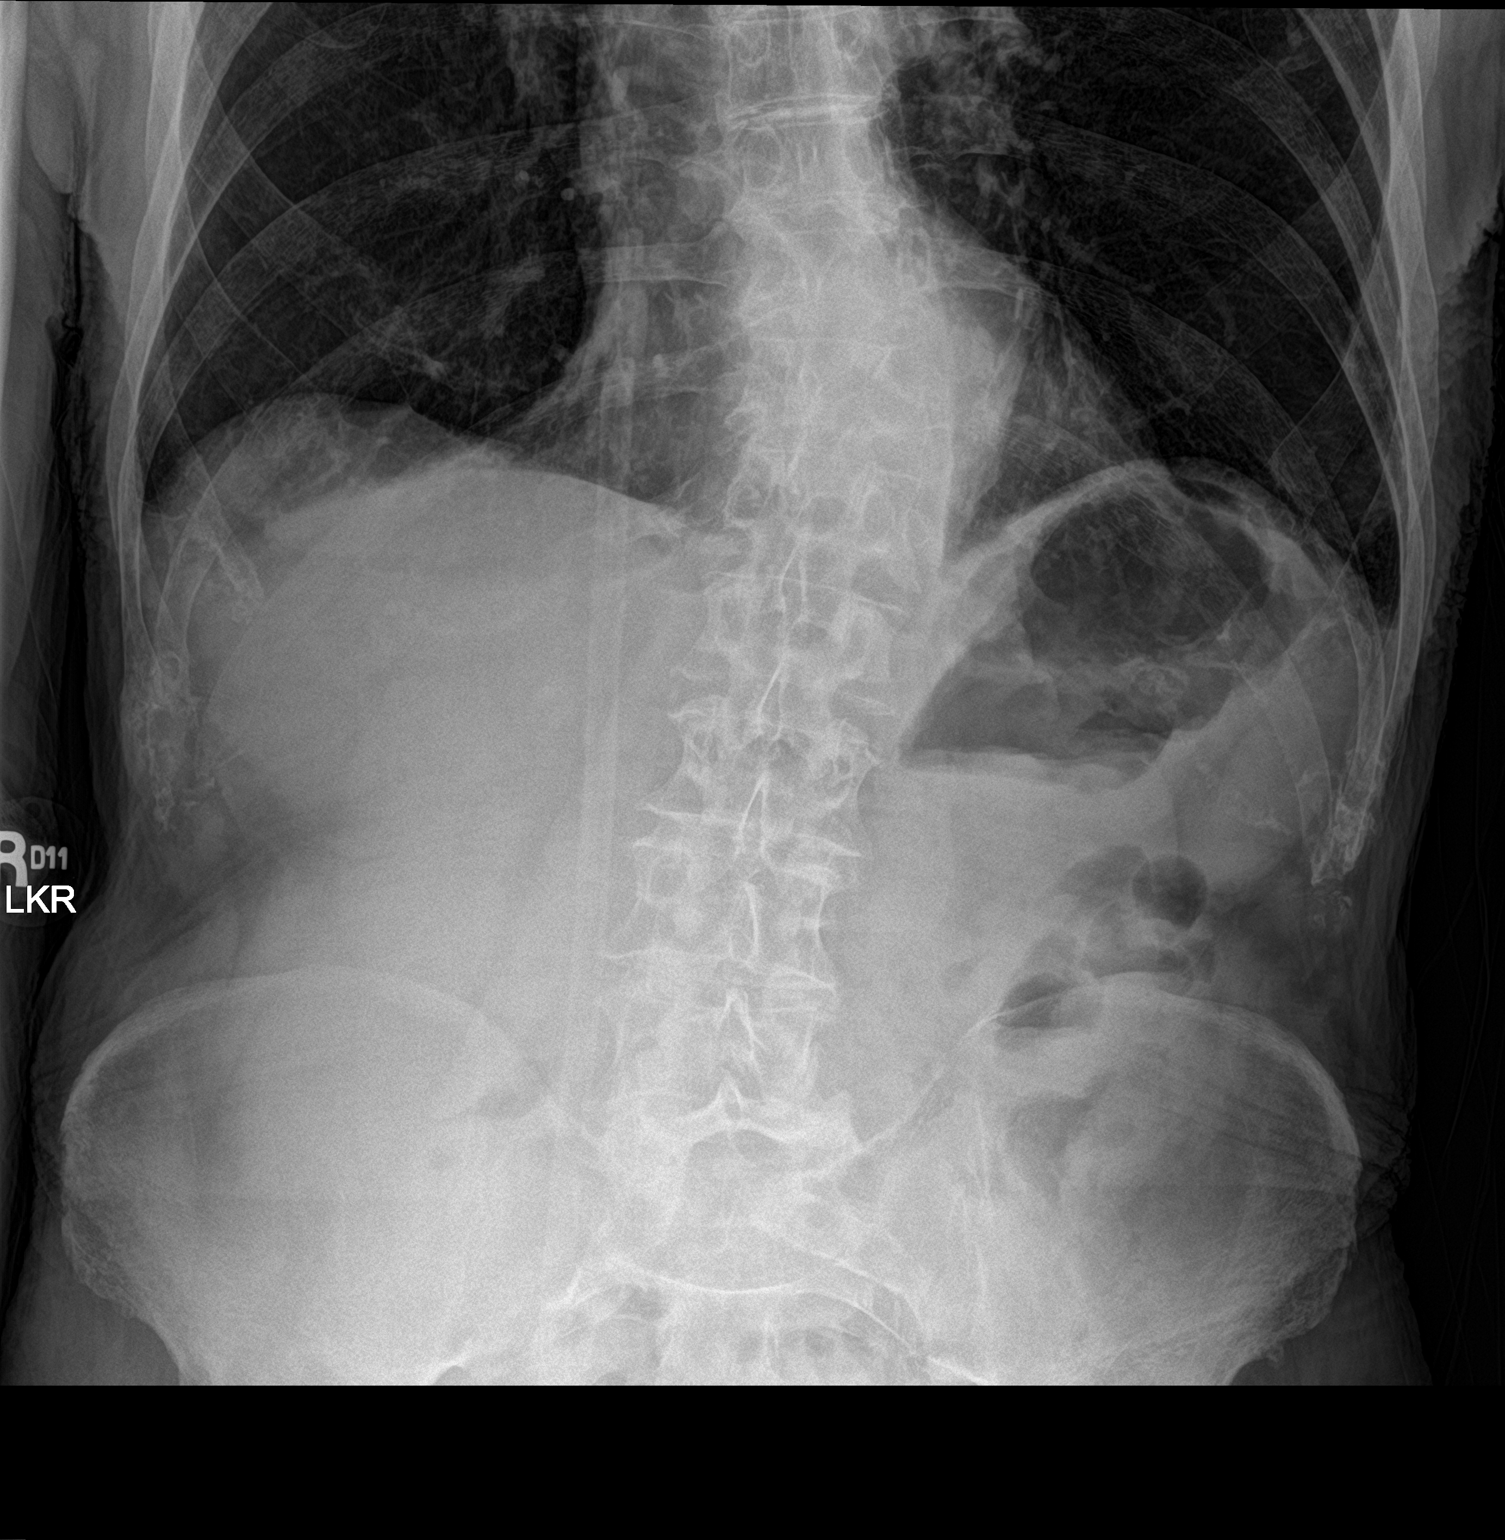

[abdomen supine]
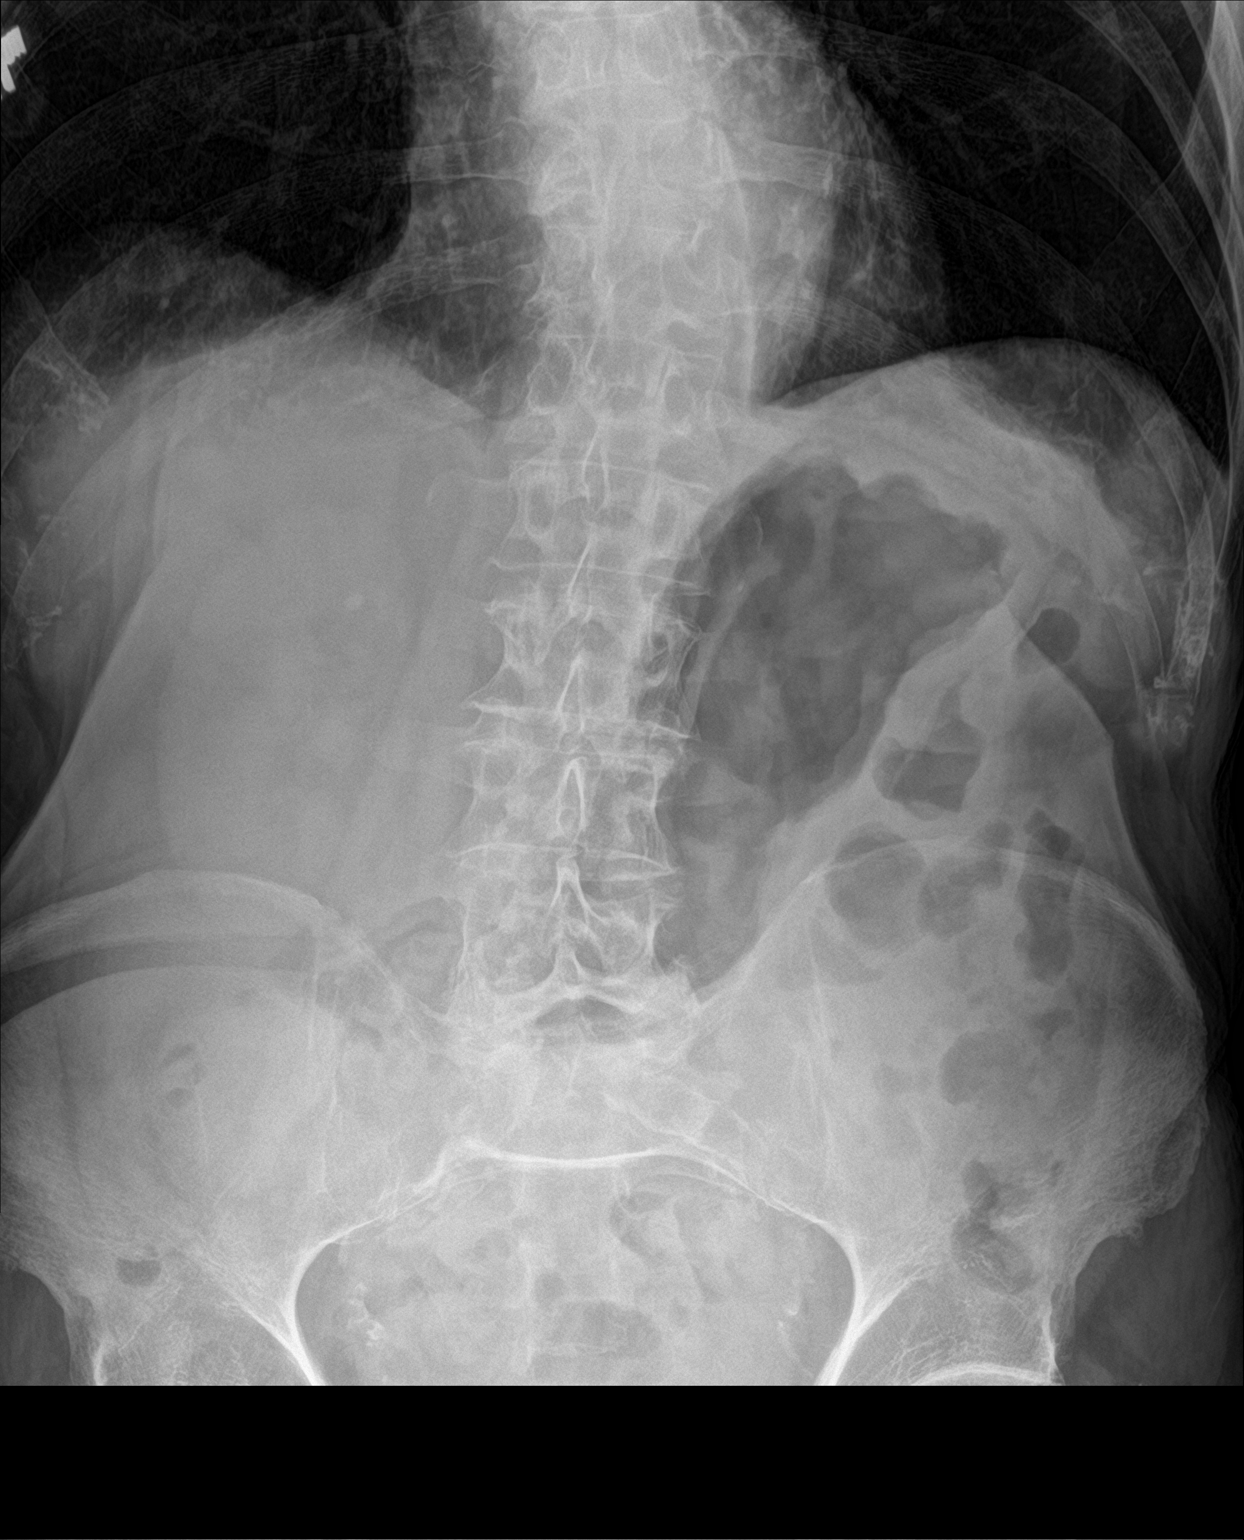

[2 of 2 positions shown; findings below may reference images not displayed]

FINDINGS: Supine and upright frontal views of the abdomen and pelvis are
obtained. Bowel gas pattern is unremarkable with no obstruction or
ileus. 4 mm calcification projects over the upper pole right kidney
consistent with a vascular calcifications seen on prior CT. The
punctate calculus within the lower pole right kidney on prior CT is
not well visualized by radiograph. Extensive vascular calcifications
are seen within the pelvis. No free gas within the greater
peritoneal sac.

The right inguinal hernia seen on prior study is not identified on
this exam due to collimation. Lung bases are clear.
IMPRESSION: 1. Stable vascular calcifications. No definite radiopaque urinary
tract calculi.
2. Unremarkable bowel gas pattern.

## 2020-03-14 NOTE — Telephone Encounter (Signed)
-----   Message from Alycia Rossetti, MD sent at 03/11/2020  4:49 PM EDT ----- Regarding: Call Urology, pt wants to cancel with [facility]  Not having symptoms at this time Does not want to have scope due to other medical issues going on

## 2020-03-14 NOTE — Telephone Encounter (Signed)
Call placed to urology and appointment cancelled.

## 2020-03-15 DIAGNOSIS — Z8546 Personal history of malignant neoplasm of prostate: Secondary | ICD-10-CM | POA: Diagnosis not present

## 2020-03-15 DIAGNOSIS — Z8701 Personal history of pneumonia (recurrent): Secondary | ICD-10-CM | POA: Diagnosis not present

## 2020-03-15 DIAGNOSIS — J439 Emphysema, unspecified: Secondary | ICD-10-CM | POA: Diagnosis not present

## 2020-03-15 DIAGNOSIS — N39498 Other specified urinary incontinence: Secondary | ICD-10-CM | POA: Diagnosis not present

## 2020-03-15 DIAGNOSIS — F419 Anxiety disorder, unspecified: Secondary | ICD-10-CM | POA: Diagnosis not present

## 2020-03-15 DIAGNOSIS — J9611 Chronic respiratory failure with hypoxia: Secondary | ICD-10-CM | POA: Diagnosis not present

## 2020-03-15 DIAGNOSIS — Z7951 Long term (current) use of inhaled steroids: Secondary | ICD-10-CM | POA: Diagnosis not present

## 2020-03-15 DIAGNOSIS — K219 Gastro-esophageal reflux disease without esophagitis: Secondary | ICD-10-CM | POA: Diagnosis not present

## 2020-03-15 DIAGNOSIS — D649 Anemia, unspecified: Secondary | ICD-10-CM | POA: Diagnosis not present

## 2020-03-15 DIAGNOSIS — Z9181 History of falling: Secondary | ICD-10-CM | POA: Diagnosis not present

## 2020-03-15 DIAGNOSIS — J309 Allergic rhinitis, unspecified: Secondary | ICD-10-CM | POA: Diagnosis not present

## 2020-03-15 DIAGNOSIS — I700 Atherosclerosis of aorta: Secondary | ICD-10-CM | POA: Diagnosis not present

## 2020-03-15 DIAGNOSIS — E039 Hypothyroidism, unspecified: Secondary | ICD-10-CM | POA: Diagnosis not present

## 2020-03-15 DIAGNOSIS — N401 Enlarged prostate with lower urinary tract symptoms: Secondary | ICD-10-CM | POA: Diagnosis not present

## 2020-03-15 DIAGNOSIS — Z79891 Long term (current) use of opiate analgesic: Secondary | ICD-10-CM | POA: Diagnosis not present

## 2020-03-15 DIAGNOSIS — Z7952 Long term (current) use of systemic steroids: Secondary | ICD-10-CM | POA: Diagnosis not present

## 2020-03-15 DIAGNOSIS — Z87891 Personal history of nicotine dependence: Secondary | ICD-10-CM | POA: Diagnosis not present

## 2020-03-16 ENCOUNTER — Telehealth: Payer: Self-pay | Admitting: Family Medicine

## 2020-03-16 LAB — COMPREHENSIVE METABOLIC PANEL
AG Ratio: 2 (calc) (ref 1.0–2.5)
ALT: 17 U/L (ref 9–46)
AST: 17 U/L (ref 10–35)
Albumin: 4 g/dL (ref 3.6–5.1)
Alkaline phosphatase (APISO): 42 U/L (ref 35–144)
BUN: 21 mg/dL (ref 7–25)
CO2: 27 mmol/L (ref 20–32)
Calcium: 9.7 mg/dL (ref 8.6–10.3)
Chloride: 99 mmol/L (ref 98–110)
Creat: 0.88 mg/dL (ref 0.70–1.11)
Globulin: 2 g/dL (calc) (ref 1.9–3.7)
Glucose, Bld: 213 mg/dL — ABNORMAL HIGH (ref 65–99)
Potassium: 4.7 mmol/L (ref 3.5–5.3)
Sodium: 139 mmol/L (ref 135–146)
Total Bilirubin: 0.4 mg/dL (ref 0.2–1.2)
Total Protein: 6 g/dL — ABNORMAL LOW (ref 6.1–8.1)

## 2020-03-16 LAB — TEST AUTHORIZATION

## 2020-03-16 LAB — CBC WITH DIFFERENTIAL/PLATELET
Absolute Monocytes: 269 cells/uL (ref 200–950)
Basophils Absolute: 19 cells/uL (ref 0–200)
Basophils Relative: 0.3 %
Eosinophils Absolute: 0 cells/uL — ABNORMAL LOW (ref 15–500)
Eosinophils Relative: 0 %
HCT: 39.7 % (ref 38.5–50.0)
Hemoglobin: 12.9 g/dL — ABNORMAL LOW (ref 13.2–17.1)
Lymphs Abs: 333 cells/uL — ABNORMAL LOW (ref 850–3900)
MCH: 30 pg (ref 27.0–33.0)
MCHC: 32.5 g/dL (ref 32.0–36.0)
MCV: 92.3 fL (ref 80.0–100.0)
MPV: 9.6 fL (ref 7.5–12.5)
Monocytes Relative: 4.2 %
Neutro Abs: 5779 cells/uL (ref 1500–7800)
Neutrophils Relative %: 90.3 %
Platelets: 289 10*3/uL (ref 140–400)
RBC: 4.3 10*6/uL (ref 4.20–5.80)
RDW: 13.1 % (ref 11.0–15.0)
Total Lymphocyte: 5.2 %
WBC: 6.4 10*3/uL (ref 3.8–10.8)

## 2020-03-16 LAB — HEMOGLOBIN A1C W/OUT EAG: Hgb A1c MFr Bld: 5.7 % of total Hgb — ABNORMAL HIGH (ref <5.7)

## 2020-03-16 NOTE — Telephone Encounter (Signed)
#  cb [phone] Marylin from [facility] need verbal order for pt to have Speech Therapist. Ok to lvm

## 2020-03-17 ENCOUNTER — Encounter: Payer: Self-pay | Admitting: Acute Care

## 2020-03-17 NOTE — Telephone Encounter (Signed)
LVM on Marilyn's phone.

## 2020-03-21 DIAGNOSIS — E039 Hypothyroidism, unspecified: Secondary | ICD-10-CM | POA: Diagnosis not present

## 2020-03-21 DIAGNOSIS — D649 Anemia, unspecified: Secondary | ICD-10-CM | POA: Diagnosis not present

## 2020-03-21 DIAGNOSIS — Z7951 Long term (current) use of inhaled steroids: Secondary | ICD-10-CM | POA: Diagnosis not present

## 2020-03-21 DIAGNOSIS — N401 Enlarged prostate with lower urinary tract symptoms: Secondary | ICD-10-CM | POA: Diagnosis not present

## 2020-03-21 DIAGNOSIS — K219 Gastro-esophageal reflux disease without esophagitis: Secondary | ICD-10-CM | POA: Diagnosis not present

## 2020-03-21 DIAGNOSIS — Z87891 Personal history of nicotine dependence: Secondary | ICD-10-CM | POA: Diagnosis not present

## 2020-03-21 DIAGNOSIS — Z8546 Personal history of malignant neoplasm of prostate: Secondary | ICD-10-CM | POA: Diagnosis not present

## 2020-03-21 DIAGNOSIS — J9611 Chronic respiratory failure with hypoxia: Secondary | ICD-10-CM | POA: Diagnosis not present

## 2020-03-21 DIAGNOSIS — Z8701 Personal history of pneumonia (recurrent): Secondary | ICD-10-CM | POA: Diagnosis not present

## 2020-03-21 DIAGNOSIS — N39498 Other specified urinary incontinence: Secondary | ICD-10-CM | POA: Diagnosis not present

## 2020-03-21 DIAGNOSIS — I700 Atherosclerosis of aorta: Secondary | ICD-10-CM | POA: Diagnosis not present

## 2020-03-21 DIAGNOSIS — J439 Emphysema, unspecified: Secondary | ICD-10-CM | POA: Diagnosis not present

## 2020-03-21 DIAGNOSIS — J309 Allergic rhinitis, unspecified: Secondary | ICD-10-CM | POA: Diagnosis not present

## 2020-03-21 DIAGNOSIS — F419 Anxiety disorder, unspecified: Secondary | ICD-10-CM | POA: Diagnosis not present

## 2020-03-21 DIAGNOSIS — Z7952 Long term (current) use of systemic steroids: Secondary | ICD-10-CM | POA: Diagnosis not present

## 2020-03-21 DIAGNOSIS — Z9181 History of falling: Secondary | ICD-10-CM | POA: Diagnosis not present

## 2020-03-21 DIAGNOSIS — Z79891 Long term (current) use of opiate analgesic: Secondary | ICD-10-CM | POA: Diagnosis not present

## 2020-03-23 ENCOUNTER — Other Ambulatory Visit (HOSPITAL_COMMUNITY): Payer: Self-pay | Admitting: Neurology

## 2020-03-23 DIAGNOSIS — R2681 Unsteadiness on feet: Secondary | ICD-10-CM | POA: Diagnosis not present

## 2020-03-23 DIAGNOSIS — J9611 Chronic respiratory failure with hypoxia: Secondary | ICD-10-CM | POA: Diagnosis not present

## 2020-03-23 DIAGNOSIS — J439 Emphysema, unspecified: Secondary | ICD-10-CM | POA: Diagnosis not present

## 2020-03-23 NOTE — Addendum Note (Signed)
Addended by: Vic Blackbird F on: 03/23/2020 12:04 PM   Modules accepted: Orders

## 2020-03-26 DIAGNOSIS — J439 Emphysema, unspecified: Secondary | ICD-10-CM | POA: Diagnosis not present

## 2020-03-28 DIAGNOSIS — F419 Anxiety disorder, unspecified: Secondary | ICD-10-CM | POA: Diagnosis not present

## 2020-03-28 DIAGNOSIS — Z8546 Personal history of malignant neoplasm of prostate: Secondary | ICD-10-CM | POA: Diagnosis not present

## 2020-03-28 DIAGNOSIS — D649 Anemia, unspecified: Secondary | ICD-10-CM | POA: Diagnosis not present

## 2020-03-28 DIAGNOSIS — N39498 Other specified urinary incontinence: Secondary | ICD-10-CM | POA: Diagnosis not present

## 2020-03-28 DIAGNOSIS — K219 Gastro-esophageal reflux disease without esophagitis: Secondary | ICD-10-CM | POA: Diagnosis not present

## 2020-03-28 DIAGNOSIS — Z9181 History of falling: Secondary | ICD-10-CM | POA: Diagnosis not present

## 2020-03-28 DIAGNOSIS — J9611 Chronic respiratory failure with hypoxia: Secondary | ICD-10-CM | POA: Diagnosis not present

## 2020-03-28 DIAGNOSIS — J439 Emphysema, unspecified: Secondary | ICD-10-CM | POA: Diagnosis not present

## 2020-03-28 DIAGNOSIS — E039 Hypothyroidism, unspecified: Secondary | ICD-10-CM | POA: Diagnosis not present

## 2020-03-28 DIAGNOSIS — Z7951 Long term (current) use of inhaled steroids: Secondary | ICD-10-CM | POA: Diagnosis not present

## 2020-03-28 DIAGNOSIS — N401 Enlarged prostate with lower urinary tract symptoms: Secondary | ICD-10-CM | POA: Diagnosis not present

## 2020-03-28 DIAGNOSIS — I700 Atherosclerosis of aorta: Secondary | ICD-10-CM | POA: Diagnosis not present

## 2020-03-28 DIAGNOSIS — Z7952 Long term (current) use of systemic steroids: Secondary | ICD-10-CM | POA: Diagnosis not present

## 2020-03-28 DIAGNOSIS — Z8701 Personal history of pneumonia (recurrent): Secondary | ICD-10-CM | POA: Diagnosis not present

## 2020-03-28 DIAGNOSIS — J309 Allergic rhinitis, unspecified: Secondary | ICD-10-CM | POA: Diagnosis not present

## 2020-03-28 DIAGNOSIS — Z79891 Long term (current) use of opiate analgesic: Secondary | ICD-10-CM | POA: Diagnosis not present

## 2020-03-28 DIAGNOSIS — Z87891 Personal history of nicotine dependence: Secondary | ICD-10-CM | POA: Diagnosis not present

## 2020-03-31 ENCOUNTER — Telehealth: Payer: Self-pay | Admitting: Acute Care

## 2020-03-31 NOTE — Telephone Encounter (Signed)
Referral was placed 03/04/20. PCCs, please advise.

## 2020-03-31 NOTE — Telephone Encounter (Signed)
[place], Modena Nunnery, MD  Magdalen Spatz, NP Cc: Vanice Kaige Whistler, CMA Pt needs to contact pulmonary about increasing to 6L and orders for this  I assume she is referring to [facility], I am not sure if this is what he needs  I will also CC Eric Form ,NP    Has this been addressed? He will need to come in for a walk, but doubt an [facility] will go up to 6 L.

## 2020-03-31 NOTE — Telephone Encounter (Signed)
Spoke with Wells Guiles with [employer]  She states needing new qualifying sats  I advised that the pt is coming in later this month and I have updated his appt notes to reflect that this is needed  Nothing further needed at this time

## 2020-04-04 ENCOUNTER — Other Ambulatory Visit: Payer: Self-pay | Admitting: Family Medicine

## 2020-04-04 DIAGNOSIS — E039 Hypothyroidism, unspecified: Secondary | ICD-10-CM | POA: Diagnosis not present

## 2020-04-04 DIAGNOSIS — J439 Emphysema, unspecified: Secondary | ICD-10-CM | POA: Diagnosis not present

## 2020-04-04 DIAGNOSIS — K219 Gastro-esophageal reflux disease without esophagitis: Secondary | ICD-10-CM | POA: Diagnosis not present

## 2020-04-04 DIAGNOSIS — F419 Anxiety disorder, unspecified: Secondary | ICD-10-CM | POA: Diagnosis not present

## 2020-04-04 DIAGNOSIS — N401 Enlarged prostate with lower urinary tract symptoms: Secondary | ICD-10-CM | POA: Diagnosis not present

## 2020-04-04 DIAGNOSIS — Z8701 Personal history of pneumonia (recurrent): Secondary | ICD-10-CM | POA: Diagnosis not present

## 2020-04-04 DIAGNOSIS — Z8546 Personal history of malignant neoplasm of prostate: Secondary | ICD-10-CM | POA: Diagnosis not present

## 2020-04-04 DIAGNOSIS — N39498 Other specified urinary incontinence: Secondary | ICD-10-CM | POA: Diagnosis not present

## 2020-04-04 DIAGNOSIS — Z87891 Personal history of nicotine dependence: Secondary | ICD-10-CM | POA: Diagnosis not present

## 2020-04-04 DIAGNOSIS — Z7952 Long term (current) use of systemic steroids: Secondary | ICD-10-CM | POA: Diagnosis not present

## 2020-04-04 DIAGNOSIS — Z7951 Long term (current) use of inhaled steroids: Secondary | ICD-10-CM | POA: Diagnosis not present

## 2020-04-04 DIAGNOSIS — D649 Anemia, unspecified: Secondary | ICD-10-CM | POA: Diagnosis not present

## 2020-04-04 DIAGNOSIS — Z9181 History of falling: Secondary | ICD-10-CM | POA: Diagnosis not present

## 2020-04-04 DIAGNOSIS — J309 Allergic rhinitis, unspecified: Secondary | ICD-10-CM | POA: Diagnosis not present

## 2020-04-04 DIAGNOSIS — I700 Atherosclerosis of aorta: Secondary | ICD-10-CM | POA: Diagnosis not present

## 2020-04-04 DIAGNOSIS — J9611 Chronic respiratory failure with hypoxia: Secondary | ICD-10-CM | POA: Diagnosis not present

## 2020-04-04 DIAGNOSIS — Z79891 Long term (current) use of opiate analgesic: Secondary | ICD-10-CM | POA: Diagnosis not present

## 2020-04-04 NOTE — Telephone Encounter (Signed)
Ok to refill??  Last office visit 03/11/2020.  Last refill 02/03/2020, #2 refills.

## 2020-04-06 ENCOUNTER — Encounter: Payer: Self-pay | Admitting: Cardiology

## 2020-04-06 ENCOUNTER — Ambulatory Visit: Payer: PPO | Admitting: Cardiology

## 2020-04-06 VITALS — BP 100/58 | HR 108 | Ht 65.0 in | Wt 115.0 lb

## 2020-04-06 DIAGNOSIS — I77810 Thoracic aortic ectasia: Secondary | ICD-10-CM | POA: Diagnosis not present

## 2020-04-06 DIAGNOSIS — J449 Chronic obstructive pulmonary disease, unspecified: Secondary | ICD-10-CM

## 2020-04-06 NOTE — Patient Instructions (Addendum)

## 2020-04-06 NOTE — Progress Notes (Signed)
Cardiology Office Note  Date: 04/06/2020   ID: John Black, DOB 1929-08-05, [name] [id number]  PCP:  Alycia Rossetti, MD  Cardiologist:  Rozann Lesches, MD Electrophysiologist:  None   Chief Complaint  Patient presents with  . Cardiology referral    History of Present Illness: John Black is an 86 y.o. male referred for cardiology consultation by Dr. Buelah Manis with diagnosis of dilated ascending aorta.  I reviewed the chart, he was hospitalized in June with acute on chronic hypoxic respiratory failure associated with COPD exacerbation.  He was treated with antibiotics, steroids, and nebulizer treatments.  During hospital stay he had an echocardiogram that revealed dilated ascending aorta at 39 mm.  He presents today with his wife and daughter.  I went over the results of his echocardiogram.  Present size of his ascending aorta would not be overly worrisome or require specific intervention, also unlikely to be associated with any symptoms.  We would generally follow this over time, perhaps with reimaging in 1 year, although frankly I would question his candidacy for a major aortic repair were this to advance.  One could even make a reasonable argument not to reimage this going forward.  He is on supplemental oxygen and in a wheelchair today, states that he does feel better since hospital discharge.  I reviewed his medications which are outlined below.  His blood pressure is low normal, actually a good situation in terms of reducing aortic wall stress.  Past Medical History:  Diagnosis Date  . Allergic rhinitis   . Cataract    s/p removal  . Chronic respiratory failure (HCC)    oxygen 3L at home  . Emphysema   . Hypothyroid   . PNA (pneumonia)   . Prostate cancer [facility])     Past Surgical History:  Procedure Laterality Date  . ADENOIDECTOMY    . ROTATOR CUFF REPAIR  1990,2009   bilateral  . Seed implant for prostate cancer  2000  . TONSILLECTOMY    .  TRANSURETHRAL RESECTION OF PROSTATE  2001   x2    Current Outpatient Medications  Medication Sig Dispense Refill  . acetaminophen (TYLENOL) 325 MG tablet Take 650 mg by mouth every 6 (six) hours as needed for mild pain.    Marland Kitchen albuterol (PROVENTIL) (2.5 MG/3ML) 0.083% nebulizer solution Take 3 mLs (2.5 mg total) by nebulization every 6 (six) hours as needed for wheezing or shortness of breath. 150 mL 12  . albuterol (VENTOLIN HFA) 108 (90 Base) MCG/ACT inhaler Inhale 2 puffs into the lungs every 6 (six) hours as needed for wheezing or shortness of breath. 18 g 11  . calcium gluconate 500 MG tablet Take 1 tablet by mouth 3 (three) times daily.    . Fluticasone-Umeclidin-Vilant (TRELEGY ELLIPTA) 100-62.5-25 MCG/INH AEPB Inhale 1 puff into the lungs daily. 28 each 0  . guaiFENesin (MUCINEX) 600 MG 12 hr tablet Take 600 mg by mouth 2 (two) times daily.     Marland Kitchen levothyroxine (SYNTHROID) 75 MCG tablet TAKE 1 TABLET (75 MCG TOTAL) BY MOUTH DAILY. (Patient taking differently: Take 75 mcg by mouth daily. ) 90 tablet 3  . LORazepam (ATIVAN) 0.5 MG tablet TAKE 1 TABLET (0.5 MG TOTAL) BY MOUTH 2 (TWO) TIMES DAILY AS NEEDED. 30 tablet 2  . pantoprazole (PROTONIX) 40 MG tablet TAKE 1 TABLET BY MOUTH EVERY DAY (Patient taking differently: Take 40 mg by mouth daily. ) 90 tablet 1  . predniSONE (DELTASONE) 10 MG tablet TAKE 1  TABLET (10 MG TOTAL) BY MOUTH DAILY WITH BREAKFAST. (Patient taking differently: Take 10 mg by mouth daily with breakfast. ) 30 tablet 5  . Respiratory Therapy Supplies (FLUTTER) DEVI Use as directed. (Patient taking differently: 1 each by Other route as directed. ) 1 each 0  . tamsulosin (FLOMAX) 0.4 MG CAPS capsule Take 1 capsule (0.4 mg total) by mouth daily. 90 capsule 3  . theophylline (UNIPHYL) 400 MG 24 hr tablet TAKE 1 TABLET BY MOUTH EVERY DAY (Patient taking differently: Take 400 mg by mouth daily. ) 90 tablet 1  . traMADol (ULTRAM) 50 MG tablet Take 1 tablet (50 mg total) by mouth  every 8 (eight) hours as needed. for pain (Patient taking differently: Take 50 mg by mouth every 8 (eight) hours as needed for moderate pain. ) 60 tablet 2  . zolpidem (AMBIEN) 5 MG tablet TAKE 1 TABLET (5 MG TOTAL) BY MOUTH AT BEDTIME AS NEEDED FOR SLEEP. 30 tablet 2   No current facility-administered medications for this visit.   Allergies:  Morphine   Social History: The patient  reports that he quit smoking about 11 years ago. His smoking use included cigarettes. He has a 75.00 pack-year smoking history. He has never used smokeless tobacco. He reports current alcohol use of about 1.0 standard drink of alcohol per week. He reports that he does not use drugs.   Family History: The patient's family history includes Heart disease in his mother; Prostate cancer in his father.   ROS:  Hearing loss.  Physical Exam: VS:  BP (!) 100/58   Pulse (!) 108   Ht 5\' 5"  (1.651 m)   Wt 115 lb (52.2 kg)   SpO2 98%   BMI 19.14 kg/m , BMI Body mass index is 19.14 kg/m.  Wt Readings from Last 3 Encounters:  04/06/20 115 lb (52.2 kg)  03/04/20 113 lb 12.8 oz (51.6 kg)  02/16/20 115 lb (52.2 kg)    General: Elderly male seated in wheelchair, wearing oxygen via nasal cannula. HEENT: Conjunctiva and lids normal, wearing a mask. Neck: Supple, no elevated JVP or carotid bruits, no thyromegaly. Lungs: Decreased breath sounds with prolonged expiratory phase, nonlabored breathing at rest. Cardiac: Regular rate and rhythm, no S3 or significant systolic murmur, no pericardial rub. Abdomen: Soft, bowel sounds present, no guarding or rebound. Extremities: No pitting edema, distal pulses 2+.  ECG:  An ECG dated 02/16/2020 was personally reviewed today and demonstrated:  Sinus tachycardia.  Recent Labwork: 02/16/2020: B Natriuretic Peptide 72.2; TSH 1.104 02/19/2020: Magnesium 2.0 03/11/2020: ALT 17; AST 17; BUN 21; Creat 0.88; Hemoglobin 12.9; Platelets 289; Potassium 4.7; Sodium 139     Component Value  Date/Time   CHOL 261 (H) 11/11/2019 0924   TRIG 149 11/11/2019 0924   HDL 100 11/11/2019 0924   CHOLHDL 2.6 11/11/2019 0924   VLDL 20 03/08/2017 0929   LDLCALC 133 (H) 11/11/2019 0924    Other Studies Reviewed Today:  Echocardiogram 02/18/2020: 1. Left ventricular ejection fraction, by estimation, is 60 to 65%. The  left ventricle has normal function. The left ventricle has no regional  wall motion abnormalities. Left ventricular diastolic parameters are  consistent with Grade II diastolic  dysfunction (pseudonormalization).  2. Right ventricular systolic function is normal. The right ventricular  size is normal.  3. The mitral valve is normal in structure. Mild mitral valve  regurgitation. No evidence of mitral stenosis.  4. The aortic valve is normal in structure. Aortic valve regurgitation is  not visualized.  No aortic stenosis is present.  5. Aortic dilatation noted. There is mild to moderate dilatation of the  ascending aorta measuring 39 mm.   Chest CT 02/17/2020: FINDINGS: Cardiovascular: There is a optimal opacification of the pulmonary arteries. There is no central,segmental, or subsegmental filling defects within the pulmonary arteries. The heart is normal in size. No pericardial effusion or thickening. No evidence right heart strain. There is normal three-vessel brachiocephalic anatomy without proximal stenosis. Scattered aortic atherosclerosis is noted. There is a mild amount of calcification at the origin of the great vessels. Coronary artery calcifications are seen.  Mediastinum/Nodes: No hilar, mediastinal, or axillary adenopathy. Thyroid gland, trachea, and esophagus demonstrate no significant findings.  Lungs/Pleura: Again noted are extensive centrilobular emphysematous changes with large bullae at both lung apices. There is architectural distortion and bronchiectasis seen at the right lung apex. Bilateral pleural calcifications are noted. No new  airspace consolidation or pleural effusion.  Upper Abdomen: No acute abnormalities present in the visualized portions of the upper abdomen.  Musculoskeletal: No chest wall abnormality. No acute or significant osseous findings.  Review of the MIP images confirms the above findings.  IMPRESSION: 1. No central, segmental, or subsegmental pulmonary embolism. 2. No other acute intrathoracic pathology to explain the patient's symptoms. 3.  Aortic Atherosclerosis (ICD10-I70.0). 4. Unchanged advanced emphysema (ICD10-J43.9).  Assessment and Plan:  1.  Dilated ascending aorta at 39 mm, asymptomatic, and without any specific intervention necessary at this time.  As discussed above, this can be reimaged over time, however I would question his candidacy for a major aortic repair were this to increase in size.  We will plan to see him back in 1 year.  Blood pressure is low normal today.  2.  COPD with chronic approxirespiratory failure, on supplemental oxygen at home.  Medication Adjustments/Labs and Tests Ordered: Current medicines are reviewed at length with the patient today.  Concerns regarding medicines are outlined above.   Tests Ordered: No orders of the defined types were placed in this encounter.   Medication Changes: No orders of the defined types were placed in this encounter.   Disposition:  Follow up 1 year in the [place] office.  Signed, Satira Sark, MD, [facility] 04/06/2020 11:06 AM    [facility] [place], [place], [place] [address] Phone: [phone]; Fax: [phone]

## 2020-04-07 ENCOUNTER — Other Ambulatory Visit: Payer: Self-pay | Admitting: Family Medicine

## 2020-04-13 ENCOUNTER — Other Ambulatory Visit: Payer: Self-pay | Admitting: Emergency Medicine

## 2020-04-14 ENCOUNTER — Encounter: Payer: Self-pay | Admitting: Internal Medicine

## 2020-04-14 ENCOUNTER — Other Ambulatory Visit: Payer: Self-pay

## 2020-04-14 ENCOUNTER — Ambulatory Visit: Payer: PPO | Admitting: Internal Medicine

## 2020-04-14 DIAGNOSIS — J9611 Chronic respiratory failure with hypoxia: Secondary | ICD-10-CM

## 2020-04-14 DIAGNOSIS — J432 Centrilobular emphysema: Secondary | ICD-10-CM

## 2020-04-14 MED ORDER — TRELEGY ELLIPTA 100-62.5-25 MCG/INH IN AEPB
1.0000 | INHALATION_SPRAY | Freq: Every day | RESPIRATORY_TRACT | 0 refills | Status: DC
Start: 2020-04-14 — End: 2020-05-26

## 2020-04-14 NOTE — Patient Instructions (Addendum)
Stop uniphyl   Continue protonix Take 30-60 min before first meal of the day   GERD (REFLUX)  is an extremely common cause of respiratory symptoms just like yours , many times with no obvious heartburn at all.    It can be treated with medication, but also with lifestyle changes including elevation of the head of your bed (ideally with 6 -8inch blocks under the headboard of your bed),  Smoking cessation, avoidance of late meals, excessive alcohol, and avoid fatty foods, chocolate, peppermint, colas, red wine, and acidic juices such as orange juice.  NO MINT OR MENTHOL PRODUCTS SO NO COUGH DROPS  USE SUGARLESS CANDY INSTEAD (Jolley ranchers or Stover's or Life Savers) or even ice chips will also do - the key is to swallow to prevent all throat clearing. NO OIL BASED VITAMINS - use powdered substitutes.  Avoid fish oil when coughing.  If breathing gets worse double the prednisone until better then back to 10 mg daily    Make sure you check your oxygen saturations at highest level of activity to be sure it stays over 90% and adjust upward to maintain this level if needed but remember to turn it back to previous settings when you stop (to conserve your supply).    Please schedule a follow up office visit in 6 weeks, call sooner if needed - bring inhalers

## 2020-04-14 NOTE — Assessment & Plan Note (Signed)
04/14/2020   Walked RA  approx   300 ft  @ slow pace  stopped due to  End of study with sats 90% dropping to 88% at end    Advised: Make sure you check your oxygen saturations at highest level of activity to be sure it stays over 90% and adjust upward to maintain this level if needed but remember to turn it back to previous settings when you stop (to conserve your supply).           Each maintenance medication was reviewed in detail including emphasizing most importantly the difference between maintenance and prns and under what circumstances the prns are to be triggered using an action plan format where appropriate.  Total time for H and P, chart review, counseling, teaching devices/  directly observing portions of ambulatory 02 saturation study/  and generating customized AVS unique to this office visit with pt new to me / charting = 50 min

## 2020-04-14 NOTE — Assessment & Plan Note (Addendum)
Quit smoking  2010 - daily pred since ? 2010?  - PFT's 12/17/13  FEV1 1.17 (57 % ) ratio 0.36  p 6 % improvement from saba p ? prior to study with DLCO  11.56 (47%) corrects to 2.19 (52%)  for alv volume and FV curve classic exp concavity on exp loop and air trapping - d/c theph 04/14/2020 and max rx for gerd   DDX of  difficult airways management almost all start with A and  include Adherence, Ace Inhibitors, Acid Reflux, Active Sinus Disease, Alpha 1 Antitripsin deficiency, Anxiety masquerading as Airways dz,  ABPA,  Allergy(esp in young), Aspiration (esp in elderly), Adverse effects of meds,  Active smoking or vaping, A bunch of PE's (a small clot burden can't cause this syndrome unless there is already severe underlying pulm or vascular dz with poor reserve) plus two Bs  = Bronchiectasis and Beta blocker use..and one C= CHF  Adherence is always the initial "prime suspect" and is a multilayered concern that requires a "trust but verify" approach in every patient - starting with knowing how to use medications, especially inhalers, correctly, keeping up with refills and understanding the fundamental difference between maintenance and prns vs those medications only taken for a very short course and then stopped and not refilled.  - reviewed elipita/ rescue rx   ? Acid (or non-acid) GERD > always difficult to exclude as up to 75% of pts in some series report no assoc GI/ Heartburn symptoms and he has dyphagia on theoph > rec d/c theph and max (24h)  acid suppression and diet restrictions/ reviewed and instructions given in writing.   ? Allergy component / steroid dep x 10 y with baseline 10 mg daily  The goal with a chronic steroid dependent illness is always arriving at the lowest effective dose that controls the disease/symptoms and not accepting a set "formula" which is based on statistics or guidelines that don't always take into account patient  variability or the natural hx of the dz in every  individual patient, which may well vary over time.  For now therefore I recommend the patient maintain  10 mg floor and 20 mg ceiling for now  ? Adverse effects of dpi > consider change to breztri next ov if upper airway symptoms don't improve  ? Anxiety/depression/ deconditioning  > usually at the bottom of this list of usual suspects but  may interfere with adherence and also interpretation of response or lack thereof to symptom management which can be quite subjective.   Adverse drug effects > d/c theoph given upper airway symptoms ? From lpr vs dpi vs both   ? chf >  Well compensated at present though note diastolic dysfunction Grade 2 on recent echo

## 2020-04-14 NOTE — Progress Notes (Addendum)
John Black, male    DOB: [birth year],     [name]: [id number]   Brief patient profile:  64 yowm quit smoking in 2010 then major aspiration event? MRSA then dx with GOLD II copd as of 2015 prev followed by Dr John Black s/p admit:  Admit date: 02/16/2020 Discharge date: 02/19/2020  Discharge Diagnoses:  Principal Problem:   COPD with acute exacerbation ([place])   Hypothyroidism   BPH (benign prostatic hyperplasia)   Acute on chronic respiratory failure with hypoxia (HCC)   Sepsis ([place])   SOB (shortness of breath)  History of present illness:  86 y.o.malewith medical history significant ofCOPD/emphysema, chronic respiratory failure on home oxygen, hypothyroidism presenting with complaints of shortness of breath.Patient reports 3 to 4-day history of progressively worsening dyspnea. He is also wheezing but not coughing much. Denies chest pain. Denies fevers or chills. States he uses 3 to 4 L supplemental oxygen at home all the time.  He was admitted with fever and shortness of breath.  He was found to have a COPD exacerbation.  He's improved with steroids, abx, and scheduled and prn nebs.  6/4 appeared improved and was discharged with plan for outpatient follow up.  Hospital Course:  Acute on chronic hypoxic respiratory failure, suspect secondary to acute COPD exacerbation: CT PE protocol without PE, no acute intrathoracic pathology, advanced emphysema Discharge with steroid taper, complete course of azithromycin, resume home nebs Ceftriaxone x3 days.  Complete 5 day total course of azithromycin.  Discharged with 2 days azithromycin.  Home theophylline Stable on home oxygen, maintained O2 sats with 4 L  Will need home O2 screen prior to d/c- maintained O2 sats 90% on home 4 L today Negative COVID testing, follow RVP negative  Sinus Tachycardia: unclear etiology? Possibly 2/2 nebs. HR up to 120-130 with ambulation with therapy. - on review of previous outpatient notes, he was  tachycardic to 120 on 01/22/20 visit and to 108 at 09/23/19 visit.  Possible chronic component. Daughter notes his HR usually in 80's-90's on pulse ox at home.  EKG and tele show sinus tach. Normal TSH from admission. Echo with grade II diastolic dysfunction, normal EF (see report) -> follow outpatient.  Mild to moderate Dilatation of the ascending aorta: 39 cm on echo - follow outpatient for surveillance   Febrile Illness: With COPD exacerbation, respiratory distress, suspect he had  viral illness leading to exacerbation.     GERD -Continue PPI  BPH -Continue Flomax  Anemia: suspect component of dilution, repeat H/H  Procedures: Echo IMPRESSIONS    1. Left ventricular ejection fraction, by estimation, is 60 to 65%. The  left ventricle has normal function. The left ventricle has no regional  wall motion abnormalities. Left ventricular diastolic parameters are  consistent with Grade II diastolic  dysfunction (pseudonormalization).  2. Right ventricular systolic function is normal. The right ventricular  size is normal.  3. The mitral valve is normal in structure. Mild mitral valve  regurgitation. No evidence of mitral stenosis.  4. The aortic valve is normal in structure. Aortic valve regurgitation is  not visualized. No aortic stenosis is present.  5. Aortic dilatation noted. There is mild to moderate dilatation of the  ascending aorta measuring 39 mm.        History of Present Illness  04/14/2020  Pulmonary/ 1st office eval/ John Black / [facility] Black [facility] / trelegy / prednisone 10  X years  Chief Complaint  Patient presents with  . Consult    shortness of breath  with exertion  Dyspnea:  MMRC3 = can't walk 100 yards even at a slow pace at a flat grade s stopping due to sob - uses w/c a lot Cough: hoarse, some choking with swallowing  Sleep: bed is flat couple of pillows SABA use: neb in am 02 4 lpm hs and 3-4 lpm with sitting/ walking   No obvious day to day or  daytime variability or assoc excess/ purulent sputum or mucus plugs or hemoptysis or cp or chest tightness, subjective wheeze or overt sinus or hb symptoms.   Sleeps ok  without nocturnal  or early am exacerbation  of respiratory  c/o's or need for noct saba. Also denies any obvious fluctuation of symptoms with weather or environmental changes or other aggravating or alleviating factors except as outlined above   No unusual exposure hx or h/o childhood pna/ asthma or knowledge of premature birth.  Current Allergies, Complete Past Medical History, Past Surgical History, Family History, and Social History were reviewed in [employer] record.  ROS  The following are not active complaints unless bolded Hoarseness, sore throat, dysphagia, dental problems, itching, sneezing,  nasal congestion or discharge of excess mucus or purulent secretions, ear ache,   fever, chills, sweats, unintended wt loss or wt gain, classically pleuritic or exertional cp,  orthopnea pnd or arm/hand swelling  or leg swelling, presyncope, palpitations, abdominal pain, anorexia, nausea, vomiting, diarrhea  or change in bowel habits or change in bladder habits, change in stools or change in urine, dysuria, hematuria,  rash, arthralgias, visual complaints, headache, numbness, weakness or ataxia or problems with walking or coordination,  change in mood or  memory.           Past Medical History:  Diagnosis Date  . Allergic rhinitis   . Cataract    s/p removal  . Chronic respiratory failure (HCC)    oxygen 3L at home  . Emphysema   . Hypothyroid   . PNA (pneumonia)   . Prostate cancer [facility])     Outpatient Medications Prior to Visit  Medication Sig Dispense Refill  . acetaminophen (TYLENOL) 325 MG tablet Take 650 mg by mouth every 6 (six) hours as needed for mild pain.    Marland Kitchen albuterol (PROVENTIL) (2.5 MG/3ML) 0.083% nebulizer solution Take 3 mLs (2.5 mg total) by nebulization every 6 (six) hours as  needed for wheezing or shortness of breath. 150 mL 12  . albuterol (VENTOLIN HFA) 108 (90 Base) MCG/ACT inhaler Inhale 2 puffs into the lungs every 6 (six) hours as needed for wheezing or shortness of breath. 18 g 11  . calcium gluconate 500 MG tablet Take 1 tablet by mouth 3 (three) times daily.    . Fluticasone-Umeclidin-Vilant (TRELEGY ELLIPTA) 100-62.5-25 MCG/INH AEPB Inhale 1 puff into the lungs daily. 28 each 0  . guaiFENesin (MUCINEX) 600 MG 12 hr tablet Take 600 mg by mouth 2 (two) times daily.     Marland Kitchen levothyroxine (SYNTHROID) 75 MCG tablet TAKE 1 TABLET (75 MCG TOTAL) BY MOUTH DAILY. 90 tablet 3  . LORazepam (ATIVAN) 0.5 MG tablet TAKE 1 TABLET (0.5 MG TOTAL) BY MOUTH 2 (TWO) TIMES DAILY AS NEEDED. 30 tablet 2  . pantoprazole (PROTONIX) 40 MG tablet TAKE 1 TABLET BY MOUTH EVERY DAY 90 tablet 1  . predniSONE (DELTASONE) 10 MG tablet TAKE 1 TABLET (10 MG TOTAL) BY MOUTH DAILY WITH BREAKFAST. (Patient taking differently: Take 10 mg by mouth daily with breakfast. ) 30 tablet 5  . tamsulosin (FLOMAX)  0.4 MG CAPS capsule Take 1 capsule (0.4 mg total) by mouth daily. 90 capsule 3  . theophylline (UNIPHYL) 400 MG 24 hr tablet TAKE 1 TABLET BY MOUTH EVERY DAY (Patient taking differently: Take 400 mg by mouth daily. ) 90 tablet 1  . traMADol (ULTRAM) 50 MG tablet Take 1 tablet (50 mg total) by mouth every 8 (eight) hours as needed. for pain (Patient taking differently: Take 50 mg by mouth every 8 (eight) hours as needed for moderate pain. ) 60 tablet 2  . zolpidem (AMBIEN) 5 MG tablet TAKE 1 TABLET (5 MG TOTAL) BY MOUTH AT BEDTIME AS NEEDED FOR SLEEP. 30 tablet 2  . Respiratory Therapy Supplies (FLUTTER) DEVI Use as directed. (Patient not taking: Reported on 04/14/2020) 1 each 0      Objective:     BP 120/66 (BP Location: Left Arm, Cuff Size: Normal)   Pulse (!) 109   Temp 97.9 F (36.6 C) (Oral)   Ht 5\' 5"  (1.651 m)   Wt 112 lb 12.8 oz (51.2 kg)   SpO2 100% Comment: 5L pulse O2  BMI 18.77  kg/m   SpO2: 100 % (5L pulse O2) O2 Type: Pulse O2 O2 Flow Rate (L/min): 5 L/min   Hoarse amb  Thin wm nad   HEENT : pt wearing mask not removed for exam due to covid -19 concerns.    NECK :  without JVD/Nodes/TM/ nl carotid upstrokes bilaterally   LUNGS: no acc muscle use,  Mod barrel  contour chest wall with bilateral  Distant bs s audible wheeze and  without cough on insp or exp maneuvers and mod  Hyperresonant  to  percussion bilaterally     CV:  RRR  no s3 or murmur or increase in P2, and no edema   ABD:  soft and nontender with pos mid insp Hoover's  in the supine position. No bruits or organomegaly appreciated, bowel sounds nl  MS:     ext warm without deformities, calf tenderness, cyanosis or clubbing No obvious joint restrictions   SKIN: warm and dry without lesions    NEURO:  alert, approp, nl sensorium with  no motor or cerebellar deficits apparent.         I personally reviewed images and agree with radiology impression as follows:   Chest CTa  02/17/20 1. No central, segmental, or subsegmental pulmonary embolism. 2. No other acute intrathoracic pathology to explain the patient's symptoms. 3.  Aortic Atherosclerosis (ICD10-I70.0). 4. Unchanged advanced emphysema (ICD10-J43.9).     Assessment   COPD (chronic obstructive pulmonary disease) with emphysema ([place]) Quit smoking  2010 - daily pred since ? 2010?  - PFT's 12/17/13  FEV1 1.17 (57 % ) ratio 0.36  p 6 % improvement from saba p ? prior to study with DLCO  11.56 (47%) corrects to 2.19 (52%)  for alv volume and FV curve classic exp concavity on exp loop and air trapping - d/c theph 04/14/2020 and max rx for gerd   DDX of  difficult airways management almost all start with A and  include Adherence, Ace Inhibitors, Acid Reflux, Active Sinus Disease, Alpha 1 Antitripsin deficiency, Anxiety masquerading as Airways dz,  ABPA,  Allergy(esp in young), Aspiration (esp in elderly), Adverse effects of meds,  Active  smoking or vaping, A bunch of PE's (a small clot burden can't cause this syndrome unless there is already severe underlying pulm or vascular dz with poor reserve) plus two Bs  = Bronchiectasis and Beta blocker use.Marland Kitchenand  one C= CHF  Adherence is always the initial "prime suspect" and is a multilayered concern that requires a "trust but verify" approach in every patient - starting with knowing how to use medications, especially inhalers, correctly, keeping up with refills and understanding the fundamental difference between maintenance and prns vs those medications only taken for a very short course and then stopped and not refilled.  - reviewed elipita/ rescue rx   ? Acid (or non-acid) GERD > always difficult to exclude as up to 75% of pts in some series report no assoc GI/ Heartburn symptoms and he has dyphagia on theoph > rec d/c theph and max (24h)  acid suppression and diet restrictions/ reviewed and instructions given in writing.   ? Allergy component / steroid dep x 10 y with baseline 10 mg daily  The goal with a chronic steroid dependent illness is always arriving at the lowest effective dose that controls the disease/symptoms and not accepting a set "formula" which is based on statistics or guidelines that don't always take into account patient  variability or the natural hx of the dz in every individual patient, which may well vary over time.  For now therefore I recommend the patient maintain  10 mg floor and 20 mg ceiling for now  ? Adverse effects of dpi > consider change to breztri next ov if upper airway symptoms don't improve  ? Anxiety/depression/ deconditioning  > usually at the bottom of this list of usual suspects but  may interfere with adherence and also interpretation of response or lack thereof to symptom management which can be quite subjective.   Adverse drug effects > d/c theoph given upper airway symptoms ? From lpr vs dpi vs both   ? chf >  Well compensated at present though  note diastolic dysfunction Grade 2 on recent echo      Chronic respiratory failure with hypoxia ([place])  04/14/2020   Walked RA  approx   300 ft  @ slow pace  stopped due to  End of study with sats 90% dropping to 88% at end    Advised: Make sure you check your oxygen saturations at highest level of activity to be sure it stays over 90% and adjust upward to maintain this level if needed but remember to turn it back to previous settings when you stop (to conserve your supply).           Each maintenance medication was reviewed in detail including emphasizing most importantly the difference between maintenance and prns and under what circumstances the prns are to be triggered using an action plan format where appropriate.  Total time for H and P, chart review, counseling, teaching devices/  directly observing portions of ambulatory 02 saturation study/  and generating customized AVS unique to this office visit with pt new to me / charting = 50 min              Christinia Gully, MD 04/14/2020

## 2020-04-17 ENCOUNTER — Other Ambulatory Visit: Payer: Self-pay | Admitting: Family Medicine

## 2020-04-21 DIAGNOSIS — Z7952 Long term (current) use of systemic steroids: Secondary | ICD-10-CM | POA: Diagnosis not present

## 2020-04-21 DIAGNOSIS — N39498 Other specified urinary incontinence: Secondary | ICD-10-CM | POA: Diagnosis not present

## 2020-04-21 DIAGNOSIS — Z7951 Long term (current) use of inhaled steroids: Secondary | ICD-10-CM | POA: Diagnosis not present

## 2020-04-21 DIAGNOSIS — J9611 Chronic respiratory failure with hypoxia: Secondary | ICD-10-CM | POA: Diagnosis not present

## 2020-04-21 DIAGNOSIS — Z8546 Personal history of malignant neoplasm of prostate: Secondary | ICD-10-CM | POA: Diagnosis not present

## 2020-04-21 DIAGNOSIS — N401 Enlarged prostate with lower urinary tract symptoms: Secondary | ICD-10-CM | POA: Diagnosis not present

## 2020-04-21 DIAGNOSIS — Z8701 Personal history of pneumonia (recurrent): Secondary | ICD-10-CM | POA: Diagnosis not present

## 2020-04-21 DIAGNOSIS — K219 Gastro-esophageal reflux disease without esophagitis: Secondary | ICD-10-CM | POA: Diagnosis not present

## 2020-04-21 DIAGNOSIS — Z79891 Long term (current) use of opiate analgesic: Secondary | ICD-10-CM | POA: Diagnosis not present

## 2020-04-21 DIAGNOSIS — F419 Anxiety disorder, unspecified: Secondary | ICD-10-CM | POA: Diagnosis not present

## 2020-04-21 DIAGNOSIS — I700 Atherosclerosis of aorta: Secondary | ICD-10-CM | POA: Diagnosis not present

## 2020-04-21 DIAGNOSIS — D649 Anemia, unspecified: Secondary | ICD-10-CM | POA: Diagnosis not present

## 2020-04-21 DIAGNOSIS — Z9181 History of falling: Secondary | ICD-10-CM | POA: Diagnosis not present

## 2020-04-21 DIAGNOSIS — J309 Allergic rhinitis, unspecified: Secondary | ICD-10-CM | POA: Diagnosis not present

## 2020-04-21 DIAGNOSIS — E039 Hypothyroidism, unspecified: Secondary | ICD-10-CM | POA: Diagnosis not present

## 2020-04-21 DIAGNOSIS — J439 Emphysema, unspecified: Secondary | ICD-10-CM | POA: Diagnosis not present

## 2020-04-21 DIAGNOSIS — Z87891 Personal history of nicotine dependence: Secondary | ICD-10-CM | POA: Diagnosis not present

## 2020-04-23 ENCOUNTER — Other Ambulatory Visit: Payer: Self-pay | Admitting: Family Medicine

## 2020-04-23 DIAGNOSIS — R2681 Unsteadiness on feet: Secondary | ICD-10-CM | POA: Diagnosis not present

## 2020-04-23 DIAGNOSIS — J9611 Chronic respiratory failure with hypoxia: Secondary | ICD-10-CM | POA: Diagnosis not present

## 2020-04-23 DIAGNOSIS — J439 Emphysema, unspecified: Secondary | ICD-10-CM | POA: Diagnosis not present

## 2020-04-25 NOTE — Telephone Encounter (Signed)
Ok to refill??  Last office visit 03/11/2020.  Last refill 07/24/2019, #2 refills.

## 2020-04-26 DIAGNOSIS — J439 Emphysema, unspecified: Secondary | ICD-10-CM | POA: Diagnosis not present

## 2020-05-04 ENCOUNTER — Ambulatory Visit (INDEPENDENT_AMBULATORY_CARE_PROVIDER_SITE_OTHER): Payer: PPO | Admitting: Family Medicine

## 2020-05-04 ENCOUNTER — Encounter: Payer: Self-pay | Admitting: Family Medicine

## 2020-05-04 ENCOUNTER — Other Ambulatory Visit: Payer: Self-pay

## 2020-05-04 VITALS — BP 106/64 | HR 80 | Temp 97.8°F | Resp 18

## 2020-05-04 DIAGNOSIS — H6123 Impacted cerumen, bilateral: Secondary | ICD-10-CM | POA: Diagnosis not present

## 2020-05-04 DIAGNOSIS — H60392 Other infective otitis externa, left ear: Secondary | ICD-10-CM

## 2020-05-04 MED ORDER — NEOMYCIN-POLYMYXIN-HC 3.5-10000-1 OT SOLN
3.0000 [drp] | Freq: Three times a day (TID) | OTIC | 0 refills | Status: DC
Start: 2020-05-04 — End: 2020-05-11

## 2020-05-04 NOTE — Progress Notes (Signed)
   Subjective:    Patient ID: John Black, male    DOB: 10-08-1928, 86 y.o.   [name]: [id number]  Patient presents for Ear Impaction (B ears full of wax) Patient here for bilateral cerumen impaction.  He was seen by his audiologist yesterday was told that he had wax in the ears.  They are trying to adjust his hearing aids.  No sinus pressure drainage. He did have some pain, he states in right ear, but it went away No drainage from ears   Reviewed his recent visits to pulmonary and cardiology.   Note at end of visit, pt wife stated he was eating fine and they wanted to cancel the barium swallow  Review Of Systems:  GEN- denies fatigue, fever, weight loss,weakness, recent illness HEENT- denies eye drainage, change in vision, nasal discharge, CVS- denies chest pain, palpitations RESP- denies SOB, cough, wheeze ABD- denies N/V, change in stools, abd pain Neuro- denies headache, dizziness, syncope, seizure activity       Objective:    BP 106/64   Pulse 80   Temp 97.8 F (36.6 C) (Temporal)   Resp 18   SpO2 92% Comment: 4L/ min via [place] GEN- NAD, alert and oriented x3,sitting in wheelchair , chronically ill appearing  HEENT- PERRL, EOMI, non injected sclera, pink conjunctiva,MMM Ear  mild wax in right canal,  Left ear wax impaction, s/p removall erythema with swelling, mild bleeding, wax still noted obsurring TM  CVS- RRR , distant HS, no murmur  RESP-clear  no wheeze, 4L oxygen        Assessment & Plan:      Problem List Items Addressed This Visit    None    Visit Diagnoses    Other infective acute otitis externa of left ear    -  Primary   S/P irrigation, infected left ear, given cortisporin, now has aides that he puts down into ear which isnew, recheck in 1 week, before he goes back for aide retesting   Bilateral impacted cerumen          Note: This dictation was prepared with Dragon dictation along with smaller phrase technology. Any transcriptional errors that  result from this process are unintentional.

## 2020-05-04 NOTE — Patient Instructions (Addendum)
F/u 1 week for recheck

## 2020-05-09 ENCOUNTER — Other Ambulatory Visit: Payer: Self-pay | Admitting: Family Medicine

## 2020-05-10 NOTE — Telephone Encounter (Signed)
Ok to refill??  Last office visit 05/04/2020.  Last refill 02/09/2020, #2 refills.

## 2020-05-11 ENCOUNTER — Ambulatory Visit (INDEPENDENT_AMBULATORY_CARE_PROVIDER_SITE_OTHER): Payer: PPO | Admitting: Family Medicine

## 2020-05-11 ENCOUNTER — Other Ambulatory Visit (HOSPITAL_COMMUNITY): Payer: PPO

## 2020-05-11 ENCOUNTER — Encounter: Payer: Self-pay | Admitting: Family Medicine

## 2020-05-11 VITALS — BP 110/64 | HR 100 | Temp 99.2°F | Resp 22

## 2020-05-11 DIAGNOSIS — H60392 Other infective otitis externa, left ear: Secondary | ICD-10-CM

## 2020-05-11 DIAGNOSIS — K5904 Chronic idiopathic constipation: Secondary | ICD-10-CM

## 2020-05-11 NOTE — Assessment & Plan Note (Signed)
No change to regimen, he asked about other procedures With his comorbidites/age, and the fact he has regular BM  I would not recommend any invasive testing or interventions Advised he can see GI, but since things are stable, likely no changes to be made Will hold for now

## 2020-05-11 NOTE — Progress Notes (Signed)
   Subjective:    Patient ID: John Black, male    DOB: 01-Dec-1928, 86 y.o.   [name]: [id number]  Patient presents for Follow-up (ear impaction/ infection)  Here to recheck his ear.  He was diagnosed with bilateral cerumen impaction which was removed but then as it has had left otitis externa which I treated with Polytrim drops.  He does not have any ear pain today.  He is scheduled to go back to his audiologist on the 17th.  His other concerns were following up his medications he is still using Ambien for sleep he has not had any falls.  Still has episodes where he feels he needs to have BM but doesn't go, bowels however move regulary every day and are formed stools     Review Of Systems:  GEN- denies fatigue, fever, weight loss,weakness, recent illness HEENT- denies eye drainage, change in vision, nasal discharge, CVS- denies chest pain, palpitations RESP- denies SOB, cough, wheeze ABD- denies N/V, change in stools, abd pain GU- denies dysuria, hematuria, dribbling, incontinence MSK- denies joint pain, muscle aches, injury Neuro- denies headache, dizziness, syncope, seizure activity       Objective:    BP 110/64   Pulse 100   Temp 99.2 F (37.3 C) (Temporal)   Resp (!) 22   SpO2 91% Comment: 4L/min via  GEN- NAD, alert and oriented x3,sitting in wheelchair , chronically ill appearing  HEENT- PERRL, EOMI, non injected sclera, pink conjunctiva,MMM TM in tact, canals clear  CVS- RRR , distant HS, no murmur  RESP-clear  no wheeze, 4L oxygen ABD-NABS,soft, nt,nd, RIGHT INGUINAL HERNIA, nt  Ext- no edema        Assessment & Plan:      Problem List Items Addressed This Visit      Unprioritized   Constipation    No change to regimen, he asked about other procedures With his comorbidites/age, and the fact he has regular BM  I would not recommend any invasive testing or interventions Advised he can see GI, but since things are stable, likely no changes to be  made Will hold for now       Other Visit Diagnoses    Other infective acute otitis externa of left ear    -  Primary   Resolved, he is already scheduled to have hearing aide adjusted      Note: This dictation was prepared with Dragon dictation along with smaller phrase technology. Any transcriptional errors that result from this process are unintentional.

## 2020-05-11 NOTE — Patient Instructions (Signed)
F/U as previous 

## 2020-05-24 ENCOUNTER — Other Ambulatory Visit: Payer: Self-pay | Admitting: Family Medicine

## 2020-05-24 DIAGNOSIS — R2681 Unsteadiness on feet: Secondary | ICD-10-CM | POA: Diagnosis not present

## 2020-05-24 DIAGNOSIS — J9611 Chronic respiratory failure with hypoxia: Secondary | ICD-10-CM | POA: Diagnosis not present

## 2020-05-24 DIAGNOSIS — J439 Emphysema, unspecified: Secondary | ICD-10-CM | POA: Diagnosis not present

## 2020-05-24 NOTE — Telephone Encounter (Signed)
Ok to refill??  Last office visit 05/11/2020.  Last refill 04/05/2020, #2 refills.

## 2020-05-26 ENCOUNTER — Encounter: Payer: Self-pay | Admitting: Internal Medicine

## 2020-05-26 ENCOUNTER — Ambulatory Visit: Payer: PPO | Admitting: Internal Medicine

## 2020-05-26 ENCOUNTER — Other Ambulatory Visit: Payer: Self-pay

## 2020-05-26 DIAGNOSIS — J449 Chronic obstructive pulmonary disease, unspecified: Secondary | ICD-10-CM

## 2020-05-26 DIAGNOSIS — J9611 Chronic respiratory failure with hypoxia: Secondary | ICD-10-CM

## 2020-05-26 MED ORDER — BREZTRI AEROSPHERE 160-9-4.8 MCG/ACT IN AERO: INHALATION_SPRAY | RESPIRATORY_TRACT | 0 refills | Status: DC

## 2020-05-26 MED ORDER — ALBUTEROL SULFATE HFA 108 (90 BASE) MCG/ACT IN AERS
INHALATION_SPRAY | RESPIRATORY_TRACT | 11 refills | Status: DC
Start: 2020-05-26 — End: 2020-09-30

## 2020-05-26 MED ORDER — BREZTRI AEROSPHERE 160-9-4.8 MCG/ACT IN AERO
2.0000 | INHALATION_SPRAY | Freq: Two times a day (BID) | RESPIRATORY_TRACT | 0 refills | Status: DC
Start: 2020-05-26 — End: 2020-05-26

## 2020-05-26 NOTE — Progress Notes (Signed)
John Black, male    DOB: [birth year],     [name]: [id number]   Brief patient profile:  86 yowm quit smoking in 2010 then major aspiration event? MRSA then dx with GOLD II copd as of 2015 prev followed by Dr John Black s/p admit:  Admit date: 02/16/2020 Discharge date: 02/19/2020  Discharge Diagnoses:  Principal Problem:   COPD with acute exacerbation (John Black)   Hypothyroidism   BPH (benign prostatic hyperplasia)   Acute on chronic respiratory failure with hypoxia (HCC)   Sepsis (John Black)   SOB (shortness of breath)  History of present illness:  86 y.o.malewith medical history significant ofCOPD/emphysema, chronic respiratory failure on home oxygen, hypothyroidism presenting with complaints of shortness of breath.Patient reports 3 to 4-day history of progressively worsening dyspnea. He is also wheezing but not coughing much. Denies chest pain. Denies fevers or chills. States he uses 3 to 4 L supplemental oxygen at home all the time.  He was admitted with fever and shortness of breath.  He was found to have a COPD exacerbation.  He's improved with steroids, abx, and scheduled and prn nebs.  6/4 appeared improved and was discharged with plan for outpatient follow up.  Hospital Course:  Acute on chronic hypoxic respiratory failure, suspect secondary to acute COPD exacerbation: CT PE protocol without PE, no acute intrathoracic pathology, advanced emphysema Discharge with steroid taper, complete course of azithromycin, resume home nebs Ceftriaxone x3 days.  Complete 5 day total course of azithromycin.  Discharged with 2 days azithromycin.  Home theophylline Stable on home oxygen, maintained O2 sats with 4 L  Will need home O2 screen prior to d/c- maintained O2 sats 90% on home 4 L today Negative COVID testing, follow RVP negative  Sinus Tachycardia: unclear etiology? Possibly 2/2 nebs. HR up to 120-130 with ambulation with therapy. - on review of previous outpatient notes, he was  tachycardic to 120 on 01/22/20 visit and to 108 at 09/23/19 visit.  Possible chronic component. Daughter notes his HR usually in 80's-90's on pulse ox at home.  EKG and tele show sinus tach. Normal TSH from admission. Echo with grade II diastolic dysfunction, normal EF (see report) -> follow outpatient.  Mild to moderate Dilatation of the ascending aorta: 39 cm on echo - follow outpatient for surveillance   Febrile Illness: With COPD exacerbation, respiratory distress, suspect he had  viral illness leading to exacerbation.     GERD -Continue PPI  BPH -Continue Flomax  Anemia: suspect component of dilution, repeat H/H  Procedures: Echo IMPRESSIONS    1. Left ventricular ejection fraction, by estimation, is 60 to 65%. The  left ventricle has normal function. The left ventricle has no regional  wall motion abnormalities. Left ventricular diastolic parameters are  consistent with Grade II diastolic  dysfunction (pseudonormalization).  2. Right ventricular systolic function is normal. The right ventricular  size is normal.  3. The mitral valve is normal in structure. Mild mitral valve  regurgitation. No evidence of mitral stenosis.  4. The aortic valve is normal in structure. Aortic valve regurgitation is  not visualized. No aortic stenosis is present.  5. Aortic dilatation noted. There is mild to moderate dilatation of the  ascending aorta measuring 39 mm.        History of Present Illness  04/14/2020  Pulmonary/ 1st office eval/ John Black / John Black Office / trelegy / prednisone 10  X years  Chief Complaint  Patient presents with  . Consult    shortness of breath  with exertion  Dyspnea:  MMRC3 = can't walk 100 yards even at a slow pace at a flat grade s stopping due to sob - uses w/c a lot Cough: hoarse, some choking with swallowing  Sleep: bed is flat couple of pillows SABA use: neb in am 02 4 lpm hs and 3-4 lpm with sitting/ walking  rec Stop uniphyl  Continue  protonix Take 30-60 min before first meal of the day  GERD  Diet  If breathing gets worse double the prednisone until better then back to 10 mg daily  Make sure you check your oxygen saturations at highest level of activity to be sure it stays over 90% and adjust upward to maintain this level if needed but remember to turn it back to previous settings when you stop (to conserve your supply).  Please schedule a follow up office visit in 6 weeks, call sooner if needed - bring inhalers         05/27/2083  f/u ov/John Black office/John Black re:  GOLD 3/ 02 dep on trelegy / preferred symbicort/ maint pred at 10 mg daily  Chief Complaint  Patient presents with  . Follow-up    shortness of breath with activity  Dyspnea: weak and sob limited to  room to room at home with cane   Cough: none  Sleeping: bed is flat/ 2 pillows SABA use: nebulizer is being using as backup frequently  02: up to 5lpm    No obvious day to day or daytime variability or assoc excess/ purulent sputum or mucus plugs or hemoptysis or cp or chest tightness, subjective wheeze or overt sinus or hb symptoms.   Sleeping  without nocturnal  or early am exacerbation  of respiratory  c/o's or need for noct saba. Also denies any obvious fluctuation of symptoms with weather or environmental changes or other aggravating or alleviating factors except as outlined above   No unusual exposure hx or h/o childhood pna/ asthma or knowledge of premature birth.  Current Allergies, Complete Past Medical History, Past Surgical History, Family History, and Social History were reviewed in [employer] record.  ROS  The following are not active complaints unless bolded Hoarseness, sore throat, dysphagia, dental problems, itching, sneezing,  nasal congestion or discharge of excess mucus or purulent secretions, ear ache,   fever, chills, sweats, unintended wt loss or wt gain, classically pleuritic or exertional cp,  orthopnea pnd or  arm/hand swelling  or leg swelling, presyncope, palpitations, abdominal pain, anorexia, nausea, vomiting, diarrhea  or change in bowel habits or change in bladder habits, change in stools or change in urine, dysuria, hematuria,  rash, arthralgias, visual complaints, headache, numbness, weakness or ataxia or problems with walking or coordination,  change in mood or  memory.        Current Meds  Medication Sig  . acetaminophen (TYLENOL) 325 MG tablet Take 650 mg by mouth every 6 (six) hours as needed for mild pain.  Marland Kitchen albuterol (PROVENTIL) (2.5 MG/3ML) 0.083% nebulizer solution UE 1 VIAL IN NEBULIZER EVERY 6 HOURS AS NEEDED FOR WHEEZING/SOB  . albuterol (VENTOLIN HFA) 108 (90 Base) MCG/ACT inhaler Inhale 2 puffs into the lungs every 6 (six) hours as needed for wheezing or shortness of breath.  . calcium gluconate 500 MG tablet Take 1 tablet by mouth 3 (three) times daily.  . Fluticasone-Umeclidin-Vilant (TRELEGY ELLIPTA) 100-62.5-25 MCG/INH AEPB Inhale 1 puff into the lungs daily.  Marland Kitchen guaiFENesin (MUCINEX) 600 MG 12 hr tablet Take 600 mg by mouth  2 (two) times daily.   Marland Kitchen levothyroxine (SYNTHROID) 75 MCG tablet TAKE 1 TABLET (75 MCG TOTAL) BY MOUTH DAILY.  Marland Kitchen LORazepam (ATIVAN) 0.5 MG tablet TAKE 1 TABLET (0.5 MG TOTAL) BY MOUTH 2 (TWO) TIMES DAILY AS NEEDED.  Marland Kitchen pantoprazole (PROTONIX) 40 MG tablet TAKE 1 TABLET BY MOUTH EVERY DAY  . predniSONE (DELTASONE) 10 MG tablet TAKE 1 TABLET (10 MG TOTAL) BY MOUTH DAILY WITH BREAKFAST. (Patient taking differently: Take 10 mg by mouth daily with breakfast. )  . Respiratory Therapy Supplies (FLUTTER) DEVI Use as directed.  . tamsulosin (FLOMAX) 0.4 MG CAPS capsule Take 1 capsule (0.4 mg total) by mouth daily.  . traMADol (ULTRAM) 50 MG tablet TAKE 1 TABLET (50 MG TOTAL) BY MOUTH EVERY 8 (EIGHT) HOURS AS NEEDED. FOR PAIN  . zolpidem (AMBIEN) 5 MG tablet TAKE 1 TABLET BY MOUTH AT BEDTIME AS NEEDED FOR SLEEP.  Marland Kitchen                    Past Medical History:   Diagnosis Date  . Allergic rhinitis   . Cataract    s/p removal  . Chronic respiratory failure (HCC)    oxygen 3L at home  . Emphysema   . Hypothyroid   . PNA (pneumonia)   . Prostate cancer ([place])        Objective:       Wt Readings from Last 3 Encounters:  05/26/20 112 lb 3.2 oz (50.9 kg)  04/14/20 112 lb 12.8 oz (51.2 kg)  04/06/20 115 lb (52.2 kg)     Vital signs reviewed - Note on arrival 05/26/2020  02 sats  91% on 5lpm       W/c bound chronically ill appearing  wm / nad  HEENT : pt wearing mask not removed for exam due to covid -19 concerns.    NECK :  without JVD/Nodes/TM/ nl carotid upstrokes bilaterally   LUNGS: no acc muscle use,  Mod barrel  contour chest wall with bilateral  Distant bs s audible wheeze and  without cough on insp or exp maneuvers and mod  Hyperresonant  to  percussion bilaterally     CV:  slt irreg distant s1s2   no s3 or murmur or increase in P2, and no edema   ABD:  soft and nontender with pos mid insp Hoover's  in the supine position. No bruits or organomegaly appreciated, bowel sounds nl  MS:     ext warm without deformities, calf tenderness, cyanosis or clubbing No obvious joint restrictions   SKIN: warm and dry without lesions    NEURO:  alert, approp, nl sensorium with  no motor or cerebellar deficits apparent.            I personally reviewed images and agree with radiology impression as follows:   Chest CTa 02/17/20 1. No central, segmental, or subsegmental pulmonary embolism. 2. No other acute intrathoracic pathology to explain the patient's symptoms. 3.  Aortic Atherosclerosis (ICD10-I70.0). 4. Unchanged advanced emphysema (ICD10-J43.9).         Assessment

## 2020-05-26 NOTE — Patient Instructions (Addendum)
Plan A = Automatic = Always=    Breztri Take 2 puffs first thing in am and then another 2 puffs about 12 hours later.   Work on inhaler technique:  relax and gently blow all the way out then take a nice smooth deep breath back in, triggering the inhaler at same time you start breathing in.  Hold for up to 5 seconds if you can. Blow out thru nose. Rinse and gargle with water when done - arm and hammer toothpaste brush tongue make slurry       Plan B = Backup (to supplement plan A, not to replace it) Only use your albuterol inhaler as a rescue medication to be used if you can't catch your breath by resting or doing a relaxed purse lip breathing pattern.  - The less you use it, the better it will work when you need it. - Ok to use the inhaler up to 2 puffs  every 4 hours if you must but call for appointment if use goes up over your usual need - Don't leave home without it !!  (think of it like the spare tire for your car)   Plan C = Crisis (instead of Plan B but only if Plan B stops working) - only use your albuterol nebulizer if you first try Plan B and it fails to help > ok to use the nebulizer up to every 4 hours but if start needing it regularly call for immediate appointment   Plan D = deltasone if losing ground and needing B and  C - double prednisone until better then 1 daily    Please schedule a follow up visit in 3 months but call sooner if needed  Add:  Needs cxr as sats lower vs prior

## 2020-05-27 ENCOUNTER — Other Ambulatory Visit: Payer: Self-pay | Admitting: Emergency Medicine

## 2020-05-27 ENCOUNTER — Other Ambulatory Visit: Payer: Self-pay

## 2020-05-27 ENCOUNTER — Encounter: Payer: Self-pay | Admitting: Internal Medicine

## 2020-05-27 ENCOUNTER — Ambulatory Visit (HOSPITAL_COMMUNITY)
Admission: RE | Admit: 2020-05-27 | Discharge: 2020-05-27 | Disposition: A | Discharge status: Home / Self Care | Payer: PPO | Source: Ambulatory Visit | Attending: Internal Medicine | Admitting: Internal Medicine

## 2020-05-27 ENCOUNTER — Ambulatory Visit (INDEPENDENT_AMBULATORY_CARE_PROVIDER_SITE_OTHER): Payer: PPO | Admitting: Family Medicine

## 2020-05-27 DIAGNOSIS — J9611 Chronic respiratory failure with hypoxia: Secondary | ICD-10-CM

## 2020-05-27 DIAGNOSIS — J479 Bronchiectasis, uncomplicated: Secondary | ICD-10-CM | POA: Diagnosis not present

## 2020-05-27 DIAGNOSIS — F17200 Nicotine dependence, unspecified, uncomplicated: Secondary | ICD-10-CM | POA: Diagnosis not present

## 2020-05-27 DIAGNOSIS — F5101 Primary insomnia: Secondary | ICD-10-CM

## 2020-05-27 DIAGNOSIS — H04123 Dry eye syndrome of bilateral lacrimal glands: Secondary | ICD-10-CM

## 2020-05-27 DIAGNOSIS — E440 Moderate protein-calorie malnutrition: Secondary | ICD-10-CM | POA: Diagnosis not present

## 2020-05-27 DIAGNOSIS — J449 Chronic obstructive pulmonary disease, unspecified: Secondary | ICD-10-CM | POA: Diagnosis not present

## 2020-05-27 DIAGNOSIS — R0602 Shortness of breath: Secondary | ICD-10-CM | POA: Diagnosis not present

## 2020-05-27 DIAGNOSIS — J439 Emphysema, unspecified: Secondary | ICD-10-CM | POA: Diagnosis not present

## 2020-05-27 IMAGING — DX DG CHEST 2V
2 series · 2 of 2 positions shown · non-contrast
Comparison: [DATE]

CLINICAL DATA: Worsening shortness of breath over the last few
months. History of COPD and former smoker. No known COVID history.

EXAM:
CHEST - 2 VIEW

[chest pa]
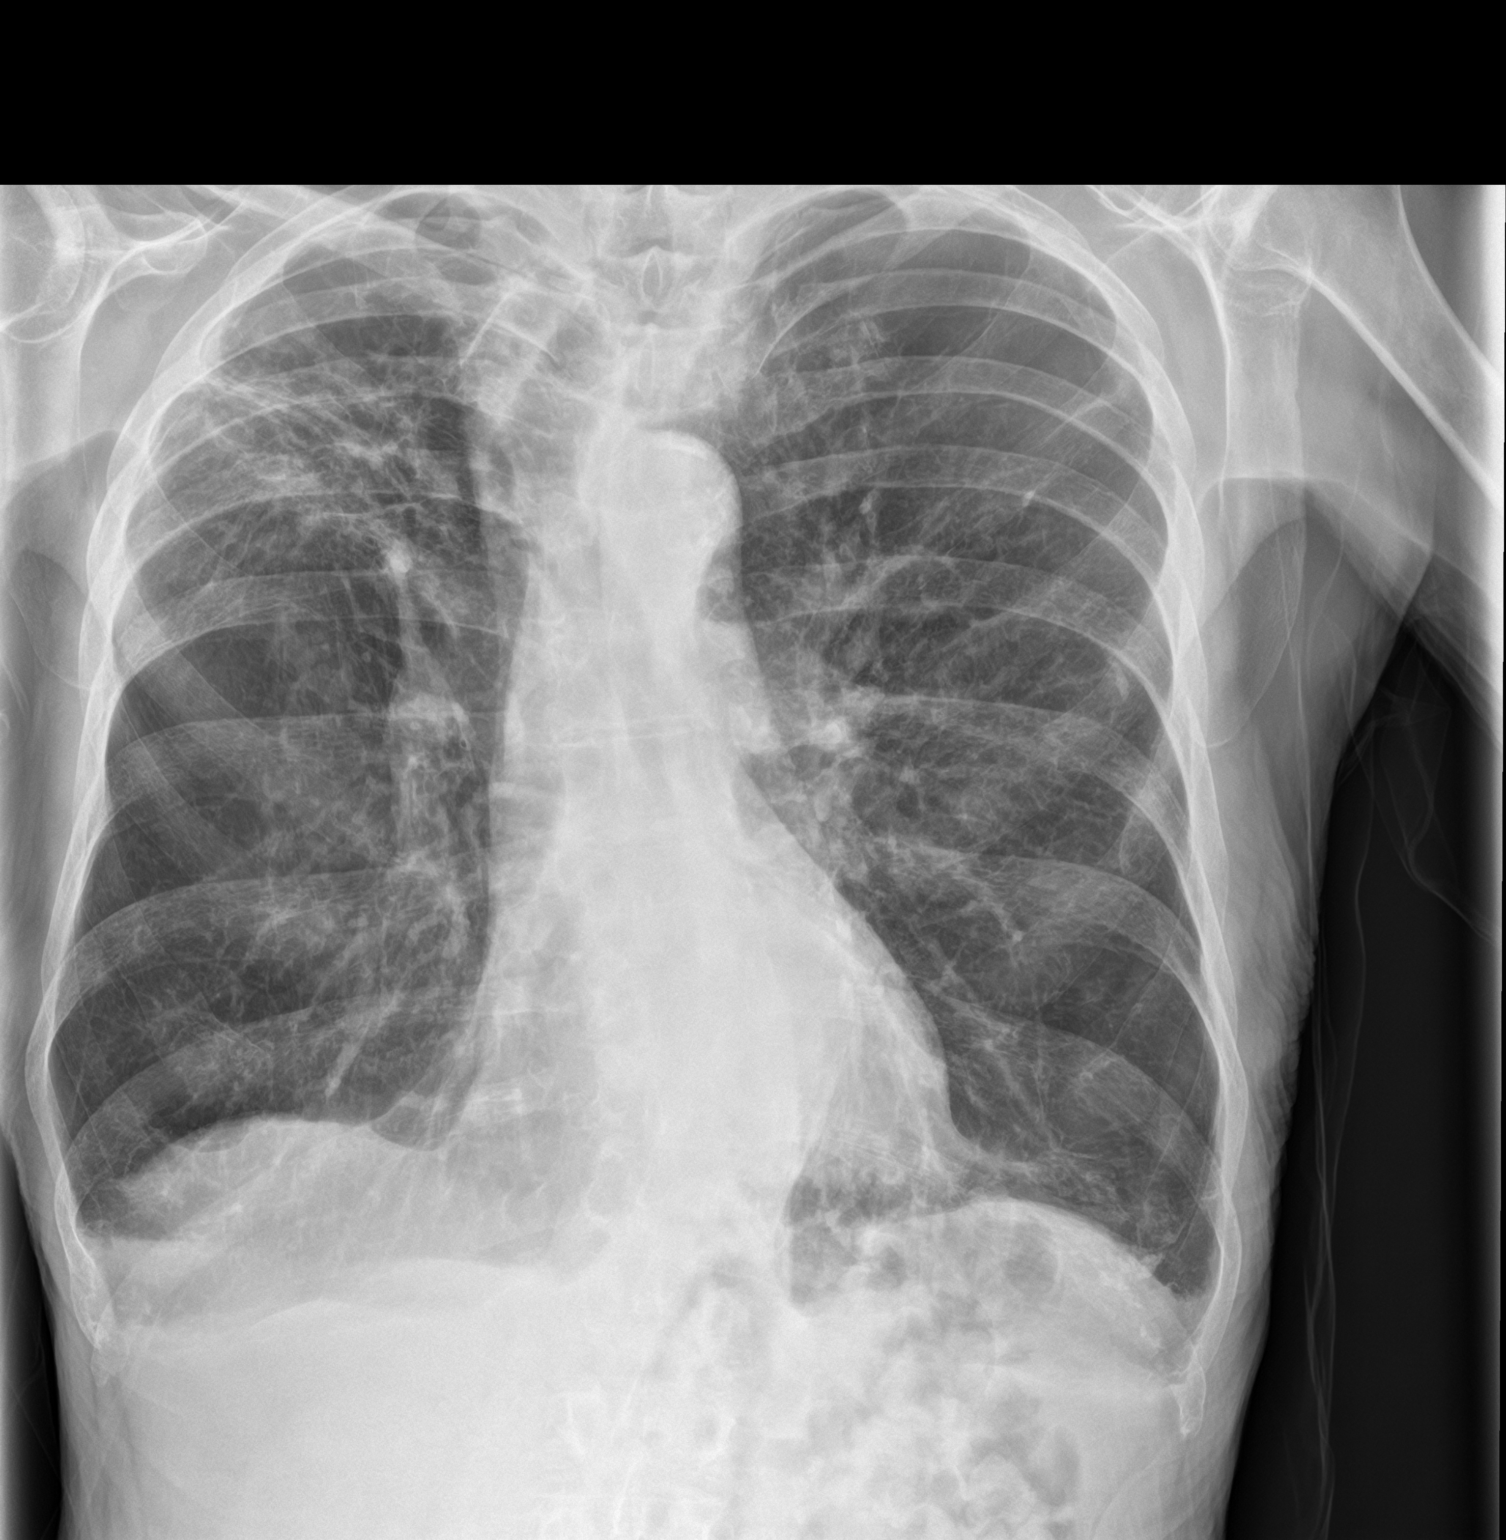

[chest lat]
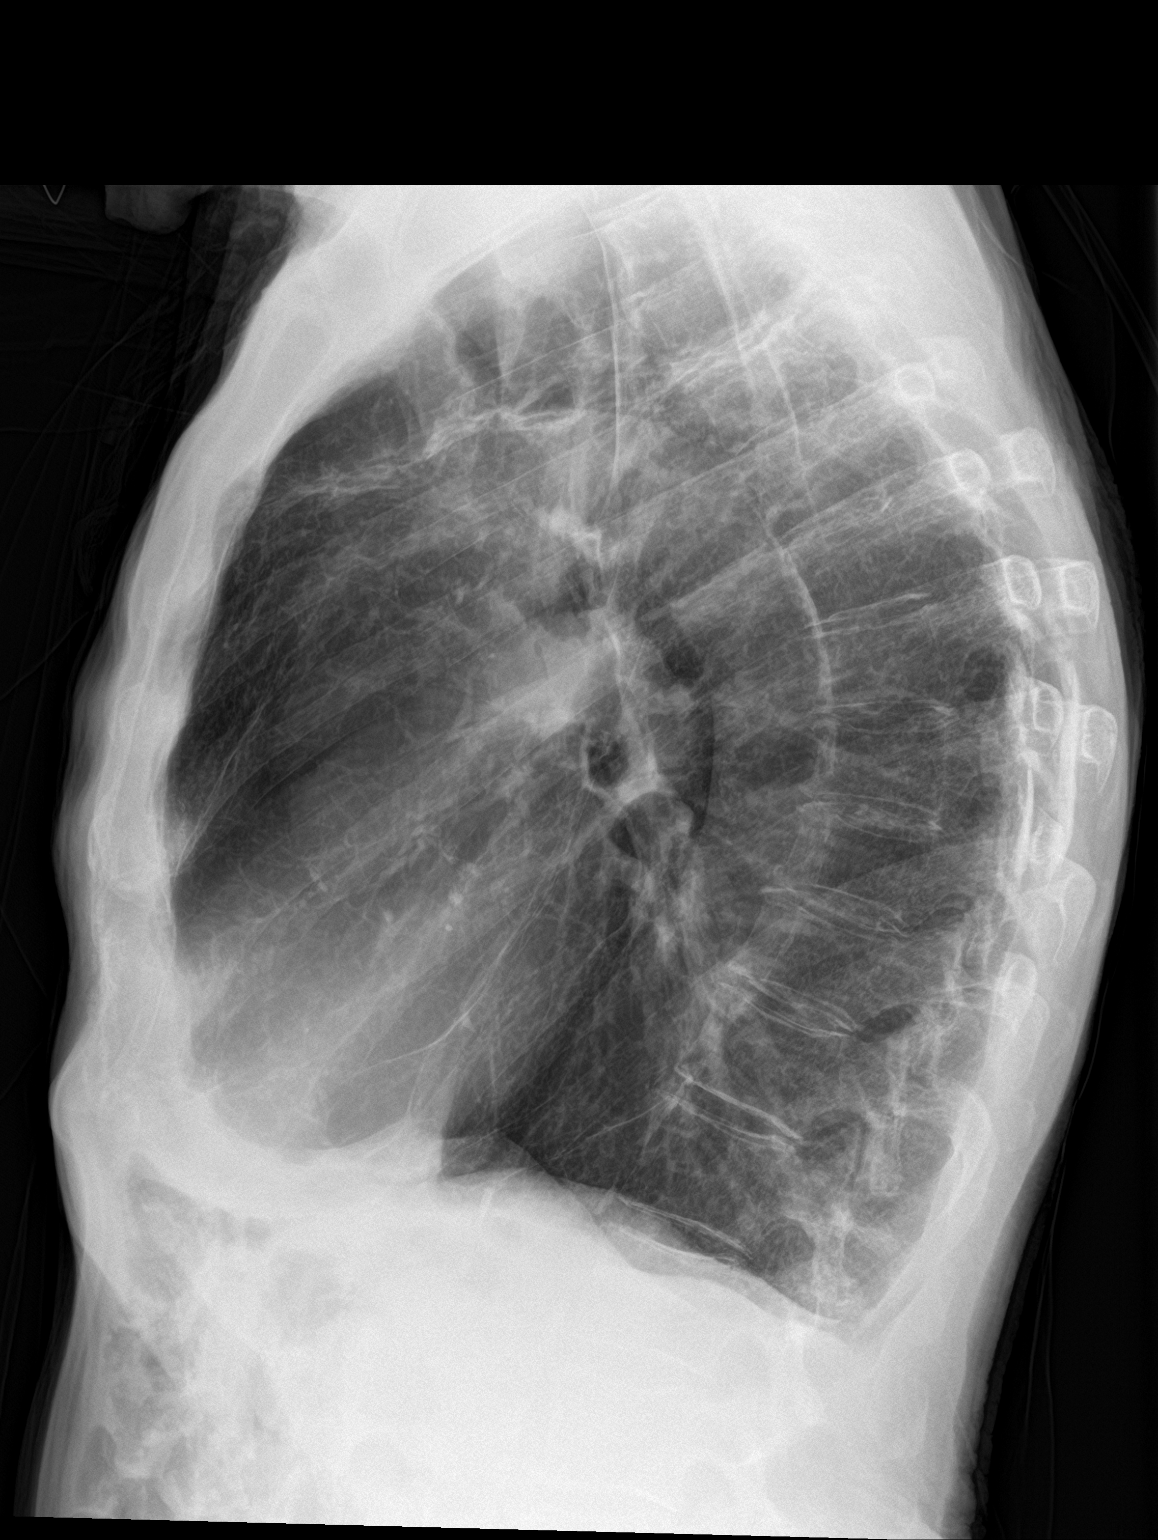

[2 of 2 positions shown; findings below may reference images not displayed]

FINDINGS: Heart size and pulmonary vascularity are normal. Emphysematous
changes in the lungs with scattered fibrosis. Chronic scarring and
bronchiectasis in the right upper lung. No airspace disease or
consolidation. No pleural effusions. No pneumothorax. Mediastinal
contours appear intact. Calcified and tortuous aorta. Degenerative
changes in the spine.
IMPRESSION: Emphysematous changes and fibrosis in the lungs. No evidence of
active pulmonary disease.

## 2020-05-27 MED ORDER — MIRTAZAPINE 15 MG PO TABS
ORAL_TABLET | ORAL | 1 refills | Status: DC
Start: 2020-05-27 — End: 2020-07-19

## 2020-05-27 NOTE — Telephone Encounter (Signed)
Yes, done.  

## 2020-05-27 NOTE — Telephone Encounter (Signed)
Dr. Wert, please advise if you are okay refilling med. 

## 2020-05-27 NOTE — Assessment & Plan Note (Signed)
04/14/2020   Walked RA  approx   300 ft  @ slow pace  stopped due to  End of study with sats 90% dropping to 88% at end   - clearly doing worse in terms of gas exchange > rec cxr and continue to titrate to keep sats > 90%           Each maintenance medication was reviewed in detail including emphasizing most importantly the difference between maintenance and prns and under what circumstances the prns are to be triggered using an action plan format where appropriate.  Total time for H and P, chart review, counseling, teaching device and generating customized AVS unique to this office visit / charting = 20 min

## 2020-05-27 NOTE — Assessment & Plan Note (Addendum)
Quit smoking  2010 - daily pred since ? 2010?  - PFT's 12/17/13  FEV1 1.17 (57 % ) ratio 0.36  p 6 % improvement from saba p ? prior to study with DLCO  11.56 (47%) corrects to 2.19 (52%)  for alv volume and FV curve classic exp concavity on exp loop and air trapping - d/c theph 04/14/2020 and max rx for gerd  - 05/26/2020  After extensive coaching inhaler device,  effectiveness =    75% try change trelegy to breztri as prev preferred symbicort   Group D in terms of symptom/risk and laba/lama/ICS  therefore appropriate rx at this point >>>  Try breztri and continue prednisone  The goal with a chronic steroid dependent illness is always arriving at the lowest effective dose that controls the disease/symptoms and not accepting a set "formula" which is based on statistics or guidelines that don't always take into account patient  variability or the natural hx of the dz in every individual patient, which may well vary over time.  For now therefore I recommend the patient maintain floor of 10 mg daily and ceiling of 20 mg / reviewed  Re saba use: I spent extra time with pt today reviewing appropriate use of albuterol for prn use on exertion with the following points: 1) saba is for relief of sob that does not improve by walking a slower pace or resting but rather if the pt does not improve after trying this first. 2) If the pt is convinced, as many are, that saba helps recover from activity faster then it's easy to tell if this is the case by re-challenging : ie stop, take the inhaler, then p 5 minutes try the exact same activity (intensity of workload) that just caused the symptoms and see if they are substantially diminished or not after saba 3) if there is an activity that reproducibly causes the symptoms, try the saba 15 min before the activity on alternate days   If in fact the saba really does help, then fine to continue to use it prn but advised may need to look closer at the maintenance regimen being  used to achieve better control of airways disease with exertion.

## 2020-05-27 NOTE — Progress Notes (Signed)
Virtual Visit via Telephone Note  I connected with John Black on 05/29/20 at 2:11pm by telephone and verified that I am speaking with the correct person using two identifiers.     Pt location: at home   Physician location:  In office, [facility], Vic Blackbird MD     On call: patient, patient wife  and physician   I discussed the limitations, risks, security and privacy concerns of performing an evaluation and management service by telephone and the availability of in person appointments. I also discussed with the patient that there may be a patient responsible charge related to this service. The patient expressed understanding and agreed to proceed.   History of Present Illness:  Telehealth visit in the setting of COVID-19, he has had 2 appt in office in the past 3 weeks with me and saw Pulmonary yesterday.  He has felt restless and anxious all the time. He is not sleeping well. He is on Ambien 5mg    He wants to try remeron , which his wife takes  He fears leaving the house at times mostly centered around his bad health, breathing and getting COVID. He just doesn't feel at ease and this affects him all day   CXR this afternoon per Pulmonary they were confused on why he needed it, reviewed chart, his oxygen sat were lower than his baseline, therefore they wanted to get CXR to make sure no sign of early infiltrate  COVID-19 Boosters , was not sure about getting booster    He has eye irritation, they feel dry and itchy  he was seen by Dr. Jorja Loa a few months ago  Was not given any drops, they have been worse the past few days He has allergy drops and artifical tears not sure what to use   Observations/Objective: Telephone visit, WOB at baseline, speaking in full sentences  Assessment and Plan:  Dry eye syndrome/ eye irritation- Eye allergy drops for 3 days for itching and red eye , then use artificial tears  Chronic insomnia/GAD- will try remeron again, he took  for a few days 1 year ago and then called to stop it thought it made his legs hurt but then felt it was another med Since no true allergy documented will try remeron 7.5mg , given between 8-9 pm,  If he does not fall asleep they can give his ambien at 11pm which is when he usually takes it  He also uses ativan during the day as needed   He has protein calorie malnutrition- remeron can help with appetite as well  He will get CXR today   I recommend he get COVID-19 booster from pharmacy   Follow Up Instructions:    I discussed the assessment and treatment plan with the patient. The patient was provided an opportunity to ask questions and all were answered. The patient agreed with the plan and demonstrated an understanding of the instructions.   The patient was advised to call back or seek an in-person evaluation if the symptoms worsen or if the condition fails to improve as anticipated.  I provided 24 minutes of non-face-to-face time during this encounter. End Time 2:35pm  Vic Blackbird, MD

## 2020-05-29 ENCOUNTER — Encounter: Payer: Self-pay | Admitting: Family Medicine

## 2020-06-17 ENCOUNTER — Other Ambulatory Visit: Payer: Self-pay | Admitting: Emergency Medicine

## 2020-06-20 ENCOUNTER — Other Ambulatory Visit: Payer: Self-pay | Admitting: Emergency Medicine

## 2020-06-20 ENCOUNTER — Telehealth: Payer: Self-pay | Admitting: Internal Medicine

## 2020-06-20 NOTE — Telephone Encounter (Signed)
Need to add on to schedule 06/21/20

## 2020-06-20 NOTE — Telephone Encounter (Signed)
Called and spoke with wife per DPR she states that he is taking Mucinex, claritin, Prednisone and switched back from [place] to [employer]. States he has some rattling in chest, productive cough with clear sputum and that when he coughs it up its a lot. Wanted to know if he needs to do nebulizer treatments because patient feels like rescue inhaler is not helping him at all. Denies fever, has vaccines  Dr. Melvyn Novas please advise

## 2020-06-20 NOTE — Telephone Encounter (Signed)
Called and spoke with Jaci Standard and patient has been scheduled for tomorrow at 3:15 with Dr. Melvyn Novas in [place]. Nothing further needed at this time.

## 2020-06-21 ENCOUNTER — Other Ambulatory Visit: Payer: Self-pay

## 2020-06-21 ENCOUNTER — Ambulatory Visit: Payer: PPO | Admitting: Internal Medicine

## 2020-06-21 ENCOUNTER — Encounter: Payer: Self-pay | Admitting: Internal Medicine

## 2020-06-21 DIAGNOSIS — J449 Chronic obstructive pulmonary disease, unspecified: Secondary | ICD-10-CM | POA: Diagnosis not present

## 2020-06-21 DIAGNOSIS — J9611 Chronic respiratory failure with hypoxia: Secondary | ICD-10-CM | POA: Diagnosis not present

## 2020-06-21 MED ORDER — TRELEGY ELLIPTA 100-62.5-25 MCG/INH IN AEPB: 1.0000 | INHALATION_SPRAY | Freq: Every day | RESPIRATORY_TRACT | 0 refills | Status: DC

## 2020-06-21 NOTE — Patient Instructions (Signed)
Plan A = Automatic = Always=    Trelegy one click each am       Plan B = Backup (to supplement plan A, not to replace it) Only use your albuterol inhaler as a rescue medication to be used if you can't catch your breath by resting or doing a relaxed purse lip breathing pattern.  - The less you use it, the better it will work when you need it. - Ok to use the inhaler up to 2 puffs  every 4 hours if you must but call for appointment if use goes up over your usual need - Don't leave home without it !!  (think of it like the spare tire for your car)   Plan C = Crisis (instead of Plan B but only if Plan B stops working) - only use your albuterol nebulizer if you first try Plan B and it fails to help > ok to use the nebulizer up to every 4 hours but if start needing it regularly call for immediate appointment   Plan D = deltasone if losing ground and needing B and  C - double prednisone until better then 1 daily   For cough/ congestion > mucinex up to 1200 mg  twice and use the flutter as much as possible   Keep your previous appt

## 2020-06-21 NOTE — Progress Notes (Signed)
John Black, male    DOB: [birth year],     [name]: [id number]   Brief patient profile:  86 yowm quit smoking in 2010 then major aspiration event? MRSA then dx with GOLD II copd as of 2015 prev followed by Dr Gwenette Greet s/p admit:  Admit date: 02/16/2020 Discharge date: 02/19/2020  Discharge Diagnoses:  Principal Problem:   COPD with acute exacerbation ([place])   Hypothyroidism   BPH (benign prostatic hyperplasia)   Acute on chronic respiratory failure with hypoxia (HCC)   Sepsis ([place])   SOB (shortness of breath)  History of present illness:  86 y.o.malewith medical history significant ofCOPD/emphysema, chronic respiratory failure on home oxygen, hypothyroidism presenting with complaints of shortness of breath.Patient reports 3 to 4-day history of progressively worsening dyspnea. He is also wheezing but not coughing much. Denies chest pain. Denies fevers or chills. States he uses 3 to 4 L supplemental oxygen at home all the time.  He was admitted with fever and shortness of breath.  He was found to have a COPD exacerbation.  He's improved with steroids, abx, and scheduled and prn nebs.  6/4 appeared improved and was discharged with plan for outpatient follow up.  Hospital Course:  Acute on chronic hypoxic respiratory failure, suspect secondary to acute COPD exacerbation: CT PE protocol without PE, no acute intrathoracic pathology, advanced emphysema Discharge with steroid taper, complete course of azithromycin, resume home nebs Ceftriaxone x3 days.  Complete 5 day total course of azithromycin.  Discharged with 2 days azithromycin.  Home theophylline Stable on home oxygen, maintained O2 sats with 4 L  Will need home O2 screen prior to d/c- maintained O2 sats 90% on home 4 L today Negative COVID testing, follow RVP negative  Sinus Tachycardia: unclear etiology? Possibly 2/2 nebs. HR up to 120-130 with ambulation with therapy. - on review of previous outpatient notes, he was  tachycardic to 120 on 01/22/20 visit and to 108 at 09/23/19 visit.  Possible chronic component. Daughter notes his HR usually in 80's-90's on pulse ox at home.  EKG and tele show sinus tach. Normal TSH from admission. Echo with grade II diastolic dysfunction, normal EF (see report) -> follow outpatient.  Mild to moderate Dilatation of the ascending aorta: 39 cm on echo - follow outpatient for surveillance   Febrile Illness: With COPD exacerbation, respiratory distress, suspect he had  viral illness leading to exacerbation.     GERD -Continue PPI  BPH -Continue Flomax  Anemia: suspect component of dilution, repeat H/H  Procedures: Echo IMPRESSIONS    1. Left ventricular ejection fraction, by estimation, is 60 to 65%. The  left ventricle has normal function. The left ventricle has no regional  wall motion abnormalities. Left ventricular diastolic parameters are  consistent with Grade II diastolic  dysfunction (pseudonormalization).  2. Right ventricular systolic function is normal. The right ventricular  size is normal.  3. The mitral valve is normal in structure. Mild mitral valve  regurgitation. No evidence of mitral stenosis.  4. The aortic valve is normal in structure. Aortic valve regurgitation is  not visualized. No aortic stenosis is present.  5. Aortic dilatation noted. There is mild to moderate dilatation of the  ascending aorta measuring 39 mm.        History of Present Illness  04/14/2020  Pulmonary/ 1st office eval/ [facility] / [name] / trelegy / prednisone 10  86 years  Chief Complaint  Patient presents with  . Consult    shortness of breath  with exertion  Dyspnea:  MMRC3 = can't walk 100 yards even at a slow pace at a flat grade s stopping due to sob - uses w/c a lot Cough: hoarse, some choking with swallowing  Sleep: bed is flat couple of pillows SABA use: neb in am 02 4 lpm hs and 3-4 lpm with sitting/ walking  rec Stop uniphyl  Continue  protonix Take 30-60 min before first meal of the day  GERD  Diet  If breathing gets worse double the prednisone until better then back to 10 mg daily  Make sure you check your oxygen saturations at highest level of activity to be sure it stays over 90% and adjust upward to maintain this level if needed but remember to turn it back to previous settings when you stop (to conserve your supply).  Please schedule a follow up office visit in 6 weeks, call sooner if needed - bring inhalers         05/26/2020  f/u ov/[place] office/Ebelyn Bohnet re:  GOLD 3/ 02 dep on trelegy / preferred symbicort/ maint pred at 10 mg daily  Chief Complaint  Patient presents with  . Follow-up    shortness of breath with activity  Dyspnea: weak and sob limited to  room to room at home with cane   Cough: none  Sleeping: bed is flat/ 2 pillows SABA use: nebulizer is being using as backup frequently  02: up to 5lpm  rec Plan A = Automatic = Always=    Breztri Take 2 puffs first thing in am and then another 2 puffs about 12 hours later.  Work on inhaler technique:   Plan B = Backup (to supplement plan A, not to replace it) Only use your albuterol inhaler as a rescue medication   Plan C = Crisis (instead of Plan B but only if Plan B stops working) - only use your albuterol nebulizer if you first try Plan B and it fails to help > ok to use the nebulizer up to every 4 hours but if start needing it regularly call for immediate appointment Plan D = deltasone if losing ground and needing B and  C - double prednisone until better then 1 daily  Please schedule a follow up visit in 3 months but call sooner if needed  Add:  Needs cxr as sats lower vs prior   06/21/2020  f/u ov/[place] office/Dail Lerew re:  GOLD III / 02 dep on breztri/ pred 10 mg Chief Complaint  Patient presents with  . Acute Visit  Dyspnea:  Room to room  Cough: more copious/ white / not using max mucinex/ no using flutter as rec  Sleeping: flat bed two  pillows SABA use: not using hfa/ neb not understanding when to use  Vs prednisone 20 as plan D above  02: 3-5 lpm titrates    No obvious day to day or daytime variability or assoc  purulent sputum or mucus plugs or hemoptysis or cp or chest tightness, subjective wheeze or overt sinus or hb symptoms.   Sleeping ok as above without nocturnal  or early am exacerbation  of respiratory  c/o's or need for noct saba. Also denies any obvious fluctuation of symptoms with weather or environmental changes or other aggravating or alleviating factors except as outlined above   No unusual exposure hx or h/o childhood pna/ asthma or knowledge of premature birth.  Current Allergies, Complete Past Medical History, Past Surgical History, Family History, and Social History were reviewed in [employer]  electronic medical record.  ROS  The following are not active complaints unless bolded Hoarseness, sore throat, dysphagia, dental problems, itching, sneezing,  nasal congestion or discharge of excess mucus or purulent secretions, ear ache,   fever, chills, sweats, unintended wt loss or wt gain, classically pleuritic or exertional cp,  orthopnea pnd or arm/hand swelling  or leg swelling, presyncope, palpitations, abdominal pain, anorexia, nausea, vomiting, diarrhea  or change in bowel habits or change in bladder habits, change in stools or change in urine, dysuria, hematuria,  rash, arthralgias, visual complaints, headache, numbness, weakness or ataxia or problems with walking or coordination,  change in mood or  memory.        Current Meds  Medication Sig  . acetaminophen (TYLENOL) 325 MG tablet Take 650 mg by mouth every 6 (six) hours as needed for mild pain.  Marland Kitchen albuterol (PROAIR HFA) 108 (90 Base) MCG/ACT inhaler 2 puffs every 4 hours as needed only  if your can't catch your breath  . albuterol (PROVENTIL) (2.5 MG/3ML) 0.083% nebulizer solution UE 1 VIAL IN NEBULIZER EVERY 6 HOURS AS NEEDED FOR WHEEZING/SOB  .  calcium gluconate 500 MG tablet Take 1 tablet by mouth 3 (three) times daily.  . Fluticasone-Umeclidin-Vilant (TRELEGY ELLIPTA) 100-62.5-25 MCG/INH AEPB Inhale 1 puff into the lungs daily.  Marland Kitchen guaiFENesin (MUCINEX) 600 MG 12 hr tablet Take 600 mg by mouth 2 (two) times daily.   Marland Kitchen levothyroxine (SYNTHROID) 75 MCG tablet TAKE 1 TABLET (75 MCG TOTAL) BY MOUTH DAILY.  Marland Kitchen LORazepam (ATIVAN) 0.5 MG tablet TAKE 1 TABLET (0.5 MG TOTAL) BY MOUTH 2 (TWO) TIMES DAILY AS NEEDED.  . mirtazapine (REMERON) 15 MG tablet Take 1/2 tablet in the evening  . pantoprazole (PROTONIX) 40 MG tablet TAKE 1 TABLET BY MOUTH EVERY DAY  . predniSONE (DELTASONE) 10 MG tablet TAKE 1 TABLET (10 MG TOTAL) BY MOUTH DAILY WITH BREAKFAST.  Marland Kitchen Respiratory Therapy Supplies (FLUTTER) DEVI Use as directed.  . tamsulosin (FLOMAX) 0.4 MG CAPS capsule Take 1 capsule (0.4 mg total) by mouth daily.  . traMADol (ULTRAM) 50 MG tablet TAKE 1 TABLET (50 MG TOTAL) BY MOUTH EVERY 8 (EIGHT) HOURS AS NEEDED. FOR PAIN  . zolpidem (AMBIEN) 5 MG tablet TAKE 1 TABLET BY MOUTH AT BEDTIME AS NEEDED FOR SLEEP.                    Past Medical History:  Diagnosis Date  . Allergic rhinitis   . Cataract    s/p removal  . Chronic respiratory failure (HCC)    oxygen 3L at home  . Emphysema   . Hypothyroid   . PNA (pneumonia)   . Prostate cancer ([place])        Objective:     06/21/2020       115  05/26/20 112 lb 3.2 oz (50.9 kg)  04/14/20 112 lb 12.8 oz (51.2 kg)  04/06/20 115 lb (52.2 kg)    Vital signs reviewed  06/21/2020  - Note at rest 02 sats  95% on 4lpm POC        W/c bound cheerful elderly thin wm nad/ min congested sounding cough  HEENT : pt wearing mask not removed for exam due to covid -19 concerns.    NECK :  without JVD/Nodes/TM/ nl carotid upstrokes bilaterally   LUNGS: no acc muscle use,  Mod barrel  contour chest wall with bilateral  Distant bs s audible wheeze and  without cough on insp or exp maneuvers and  mod   Hyperresonant  to  percussion bilaterally     CV:  RRR  no s3 or murmur or increase in P2, and no edema   ABD:  soft and nontender with pos mid insp Hoover's  in the supine position. No bruits or organomegaly appreciated, bowel sounds nl  MS:     ext warm without deformities, calf tenderness, cyanosis or clubbing No obvious joint restrictions   SKIN: warm and dry without lesions    NEURO:  alert, approp, nl sensorium with  no motor or cerebellar deficits apparent.               I personally reviewed images and agree with radiology impression as follows:  CXR:   05/27/20  Emphysematous changes and fibrosis in the lungs. No evidence of active pulmonary disease.       Assessment

## 2020-06-22 ENCOUNTER — Encounter: Payer: Self-pay | Admitting: Internal Medicine

## 2020-06-22 NOTE — Assessment & Plan Note (Signed)
Quit smoking  2010 - daily pred since ? 2010?  - PFT's 12/17/13  FEV1 1.17 (57 % ) ratio 0.36  p 6 % improvement from saba p ? prior to study with DLCO  11.56 (47%) corrects to 2.19 (52%)  for alv volume and FV curve classic exp concavity on exp loop and air trapping - d/c theph 04/14/2020 and max rx for gerd  - 05/26/2020   try change trelegy to breztri as prev preferred symbicort - 06/21/2020 changed back to trelegy at pt request - 06/21/2020  After extensive coaching inhaler device,  effectiveness =    75% with hfa > continue with proair hfa as Plan B    Group D in terms of symptom/risk and laba/lama/ICS  therefore appropriate rx at this point >>>  Ok to change back to Trelegy as Plan A but really not understanding plan BCD - see avs for instructions unique to this ov    Re saba: I spent extra time with pt today reviewing appropriate use of albuterol for prn use on exertion with the following points: 1) saba is for relief of sob that does not improve by walking a slower pace or resting but rather if the pt does not improve after trying this first. 2) If the pt is convinced, as many are, that saba helps recover from activity faster then it's easy to tell if this is the case by re-challenging : ie stop, take the inhaler, then p 5 minutes try the exact same activity (intensity of workload) that just caused the symptoms and see if they are substantially diminished or not after saba 3) if there is an activity that reproducibly causes the symptoms, try the saba 15 min before the activity on alternate days   If in fact the saba really does help, then fine to continue to use it prn but advised may need to look closer at the maintenance regimen being used to achieve better control of airways disease with exertion.   Re pred: The goal with a chronic steroid dependent illness is always arriving at the lowest effective dose that controls the disease/symptoms and not accepting a set "formula" which is based on  statistics or guidelines that don't always take into account patient  variability or the natural hx of the dz in every individual patient, which may well vary over time.  For now therefore I recommend the patient maintain  10 mg floor and 20 mg ceiling as Plan D

## 2020-06-22 NOTE — Assessment & Plan Note (Signed)
04/14/2020   Walked RA  approx   300 ft  @ slow pace  stopped due to  End of study with sats 90% dropping to 88% at end   As of 06/21/2020 rec titrate 02 to maintain sats > 90%         Each maintenance medication was reviewed in detail including emphasizing most importantly the difference between maintenance and prns and under what circumstances the prns are to be triggered using an action plan format where appropriate.  Total time for H and P, chart review, counseling, teaching devices and generating customized AVS unique to this acute office visit / charting = 32 nub

## 2020-06-23 DIAGNOSIS — R2681 Unsteadiness on feet: Secondary | ICD-10-CM | POA: Diagnosis not present

## 2020-06-23 DIAGNOSIS — J439 Emphysema, unspecified: Secondary | ICD-10-CM | POA: Diagnosis not present

## 2020-06-23 DIAGNOSIS — J9611 Chronic respiratory failure with hypoxia: Secondary | ICD-10-CM | POA: Diagnosis not present

## 2020-07-12 ENCOUNTER — Ambulatory Visit (INDEPENDENT_AMBULATORY_CARE_PROVIDER_SITE_OTHER): Payer: PPO | Admitting: Family Medicine

## 2020-07-12 ENCOUNTER — Other Ambulatory Visit: Payer: Self-pay

## 2020-07-12 ENCOUNTER — Encounter: Payer: Self-pay | Admitting: Family Medicine

## 2020-07-12 VITALS — BP 128/50 | HR 115 | Temp 98.3°F | Ht 65.0 in | Wt 117.6 lb

## 2020-07-12 DIAGNOSIS — E039 Hypothyroidism, unspecified: Secondary | ICD-10-CM | POA: Diagnosis not present

## 2020-07-12 DIAGNOSIS — E440 Moderate protein-calorie malnutrition: Secondary | ICD-10-CM | POA: Diagnosis not present

## 2020-07-12 DIAGNOSIS — Z23 Encounter for immunization: Secondary | ICD-10-CM

## 2020-07-12 DIAGNOSIS — F5101 Primary insomnia: Secondary | ICD-10-CM

## 2020-07-12 DIAGNOSIS — R21 Rash and other nonspecific skin eruption: Secondary | ICD-10-CM

## 2020-07-12 NOTE — Patient Instructions (Addendum)
Use Desitin cream on rash  F/U 4 months for Physical

## 2020-07-12 NOTE — Assessment & Plan Note (Signed)
Continue low dose ambien,for quality of life, Family and pt aware of increased risk of falls, we have had numerous discussions

## 2020-07-12 NOTE — Progress Notes (Signed)
   Subjective:    Patient ID: John Black, male    DOB: 10-11-1928, 86 y.o.   [name]: [id number]  Patient presents for Follow-up  Pt here to f/u chronic medical problems meds reviewed He is followed by pulmonary for COPD with oxygen dependency  His breathing has been stable. He is trying to use his albuterol as prescribed by his pulmonologist regularly.  Concerned about a rash that is on his scrotum. When he urinates because of the large hernia he is not able to wipe himself as well. He noticed redness and irritation on his scrotal sac on the right side mostly the past couple weeks. They did use [employer] and another topical ointment which helps some. He no longer has any pain but it is still irritated.  Insomnia doing well on ambien  Weight up 5lbs in the past 6 weeks, taking remeron 7.5mg    Review Of Systems:  GEN- denies fatigue, fever, weight loss,weakness, recent illness HEENT- denies eye drainage, change in vision, nasal discharge, CVS- denies chest pain, palpitations RESP- denies SOB, cough, wheeze ABD- denies N/V, change in stools, abd pain GU- denies dysuria, hematuria, dribbling, incontinence MSK- denies joint pain, muscle aches, injury Neuro- denies headache, dizziness, syncope, seizure activity       Objective:    BP (!) 128/50 (BP Location: Right Arm, Patient Position: Sitting, Cuff Size: Normal)   Pulse (!) 115   Temp 98.3 F (36.8 C) (Oral)   Ht 5\' 5"  (1.651 m)   Wt 117 lb 9.6 oz (53.3 kg)   SpO2 91%   BMI 19.57 kg/m  GEN- NAD, alert and oriented x3,sitting in wheelchair , chronically ill appearing  HEENT- PERRL, EOMI, non injected sclera, pink conjunctiva,MMM TM in tact, canals clear  CVS- mild Tachycardia  , distant HS, no murmur  RESP-clear  no wheeze, 4L oxygen ABD-NABS,soft, nt,nd, RIGHT INGUINAL HERNIA with erythema- irritated apperance over scrotum Ext- no edema         Assessment & Plan:      Problem List Items Addressed This Visit       Unprioritized   Hypothyroidism    Continue replacement Check TFT at next visit       Insomnia    Continue low dose ambien,for quality of life, Family and pt aware of increased risk of falls, we have had numerous discussions      Protein calorie malnutrition (HCC)    Weight improved Continue remeron, tolerating without any difficulty        Other Visit Diagnoses    Rash and other nonspecific skin eruption    -  Primary   Use Desitin , treating like a diaper rash, as he also wears depends and this can worsen rash sitting in moisture   Need for immunization against influenza       Relevant Orders   Flu Vaccine QUAD High Dose(Fluad) (Completed)      Note: This dictation was prepared with Dragon dictation along with smaller phrase technology. Any transcriptional errors that result from this process are unintentional.

## 2020-07-12 NOTE — Assessment & Plan Note (Signed)
Weight improved Continue remeron, tolerating without any difficulty

## 2020-07-12 NOTE — Assessment & Plan Note (Signed)
Continue replacement Check TFT at next visit

## 2020-07-19 ENCOUNTER — Other Ambulatory Visit: Payer: Self-pay | Admitting: Family Medicine

## 2020-07-24 DIAGNOSIS — R2681 Unsteadiness on feet: Secondary | ICD-10-CM | POA: Diagnosis not present

## 2020-07-24 DIAGNOSIS — J9611 Chronic respiratory failure with hypoxia: Secondary | ICD-10-CM | POA: Diagnosis not present

## 2020-07-24 DIAGNOSIS — J439 Emphysema, unspecified: Secondary | ICD-10-CM | POA: Diagnosis not present

## 2020-08-07 ENCOUNTER — Other Ambulatory Visit: Payer: Self-pay | Admitting: Family Medicine

## 2020-08-08 ENCOUNTER — Other Ambulatory Visit: Payer: Self-pay | Admitting: Family Medicine

## 2020-08-23 DIAGNOSIS — R2681 Unsteadiness on feet: Secondary | ICD-10-CM | POA: Diagnosis not present

## 2020-08-23 DIAGNOSIS — J9611 Chronic respiratory failure with hypoxia: Secondary | ICD-10-CM | POA: Diagnosis not present

## 2020-08-23 DIAGNOSIS — J439 Emphysema, unspecified: Secondary | ICD-10-CM | POA: Diagnosis not present

## 2020-08-24 ENCOUNTER — Ambulatory Visit: Payer: PPO | Admitting: Internal Medicine

## 2020-08-26 NOTE — Chronic Care Management (AMB) (Signed)
Chronic Care Management Pharmacy  Name: John Black  [name]: [id number] DOB: 1928/10/21  Chief Complaint/ HPI  Roanna Raider,  86 y.o. , male presents for their Follow-Up CCM visit with the clinical pharmacist via telephone.  PCP : Alycia Rossetti, MD  Their chronic conditions include: allergic rhinitis, COPD, chronic diarrhea, hypothyroidism, BPH, mild hyperlipidemia.  Office Visits: 07/12/2020 [facility]) - presents with rash on scrotum, Desitin recommended he is also tolerating Remeron well and continues to use this for sleep/appetite.  02/16/2020 ([place]) - O2 sat low, patient in acute respiratory failure.  EMS called to transport to hospital.  01/22/2020 [facility]) - constipation and chronic diarrhea, reduced stool softener to once daily and take this a few times per week, d/c NSAIDS and chronic prednisone.  PSA elevated and referral placed to urology, patient has history of prostate cancer.  11/11/2019 [facility]) - medicare wellness, COPD oxygen dependent, also back on Ambien 5mg  for sleep, sometimes constipated feels as if he is not emptying bowels all of the way, advised to take Miralax three times per week  Consult Visit: 08/29/2020 [facility], Pulmonology) - patient cannot walk even 100 yards at a slow pace without getting SOB.  Counseled on appropriate use of albuterol and when to use it.  No changes to medication.  Instructed to double prednisone if SOB occurs and then to decrease back down to 10mg  daily once he feels better.    02/16/2020 [facility] ED) - 3 to 4 day worsening dyspnea, wheezing.  Was admitted for fever and SOB with a COPD exacerbation.  Improved on steroids, abx, and nebs.  Medications: Outpatient Encounter Medications as of 09/05/2020  Medication Sig Note  . acetaminophen (TYLENOL) 325 MG tablet Take 650 mg by mouth every 6 (six) hours as needed for mild pain.   Marland Kitchen albuterol (PROAIR HFA) 108 (90 Base) MCG/ACT inhaler 2 puffs every 4 hours as needed only   if your can't catch your breath   . albuterol (PROVENTIL) (2.5 MG/3ML) 0.083% nebulizer solution UE 1 VIAL IN NEBULIZER EVERY 6 HOURS AS NEEDED FOR WHEEZING/SOB   . calcium gluconate 500 MG tablet Take 1 tablet by mouth 3 (three) times daily.   . Fluticasone-Umeclidin-Vilant (TRELEGY ELLIPTA) 100-62.5-25 MCG/INH AEPB Inhale 1 puff into the lungs daily.   . Fluticasone-Umeclidin-Vilant (TRELEGY ELLIPTA) 100-62.5-25 MCG/INH AEPB Inhale 1 puff into the lungs daily.   Marland Kitchen guaiFENesin (MUCINEX) 600 MG 12 hr tablet Take 600 mg by mouth 2 (two) times daily.    Marland Kitchen levothyroxine (SYNTHROID) 75 MCG tablet TAKE 1 TABLET (75 MCG TOTAL) BY MOUTH DAILY.   Marland Kitchen LORazepam (ATIVAN) 0.5 MG tablet TAKE 1 TABLET (0.5 MG TOTAL) BY MOUTH 2 (TWO) TIMES DAILY AS NEEDED.   . mirtazapine (REMERON) 15 MG tablet TAKE 1/2 TABLET BY MOUTH IN THE EVENING   . pantoprazole (PROTONIX) 40 MG tablet TAKE 1 TABLET BY MOUTH EVERY DAY   . predniSONE (DELTASONE) 10 MG tablet TAKE 1 TABLET (10 MG TOTAL) BY MOUTH DAILY WITH BREAKFAST.   Marland Kitchen Respiratory Therapy Supplies (FLUTTER) DEVI Use as directed. 04/14/2020: Pt doesn't want to use  . tamsulosin (FLOMAX) 0.4 MG CAPS capsule TAKE 1 CAPSULE BY MOUTH EVERY DAY   . traMADol (ULTRAM) 50 MG tablet TAKE 1 TABLET (50 MG TOTAL) BY MOUTH EVERY 8 (EIGHT) HOURS AS NEEDED. FOR PAIN   . zolpidem (AMBIEN) 5 MG tablet TAKE 1 TABLET BY MOUTH EVERY DAY AT BEDTIME AS NEEDED FOR SLEEP    No facility-administered encounter medications on  file as of 09/05/2020.     Current Diagnosis/Assessment:  Emergency planning/management officer Strain: Low Risk   . Difficulty of Paying Living Expenses: Not very hard     Goals Addressed            This Visit's Progress   . Pharmacy Care Plan:       CARE PLAN ENTRY  Current Barriers:  . Chronic Disease Management support, education, and care coordination needs related to COPD and Hypothyroidism   COPD . Pharmacist Clinical Goal(s) o Over the next 120 days, patient will work  with PharmD and providers to optimize medication regimen and minimize symptoms related to COPD. . Current regimen:  o Ventolin HFA o Trelegy Ellipta o Prednisone 10mg  . Interventions: o Counseled on use of rescue inhaler, nebulizers o Evaluated for exacerbations and limitations of ADLs o Discussed need for assistance with Trelegy copay . Patient self care activities - Over the next 120 days, patient will: o Continue to focus on medication adherence and daily use of maintenance inhaler o Contact PharmD or PCP with any increase in SOB or other breathing related symptoms o Contact providers with any needs regarding Trelegy    Hypothyroidism . Pharmacist Clinical Goal(s) o Over the next 120 days, patient will work with PharmD and providers to optimize medication related to hypothhyroidism. . Current regimen:  o Levothyroxine 66mcg . Interventions: o Comprehensive medication review o Reviewed recent lab values for TSH o Continued current therapy . Patient self care activities - Over the next 120 days, patient will: o Continue to focus on medication adherence by pill count o Present to CPE in march for updated lab work  Medication Assistance:  Current Barriers:  . 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  Pharmacist Clinical Goal(s):  Marland Kitchen Over the next 30 days, patient will work with PharmD and providers to relieve medication access concerns  Interventions: . Comprehensive medication review completed; medication list updated in electronic medical record.  Bertram Savin care team collaboration (see longitudinal plan of care) . PAP documentation by PharmD: Patient to be evaluated for out of pocket spend criteria for this medication's patient assistance program. Reviewed application process. Patient will provide proof of income, out of pocket spend report, and will sign  application. Will collaborate with primary care provider Dr. Lamonte Sakai for their portion of application. Once completed, will submit to [facility] patient assistance program. . Informed patient to contact me regarding samples   Patient Self Care Activities:  . Patient will provide necessary portions of application     Please see past updates related to this goal by clicking on the "Past Updates" button in the selected goal         COPD    Eosinophil count:   Lab Results  Component Value Date/Time   EOSPCT 0.0 03/11/2020 02:46 PM  %                               Eos (Absolute):  Lab Results  Component Value Date/Time   EOSABS 0 (L) 03/11/2020 02:46 PM    Tobacco Status:  Social History   Tobacco Use  Smoking Status Former Smoker  . Packs/day: 1.50  . Years: 50.00  . Pack years: 75.00  . Types: Cigarettes  . Quit date: 09/17/2008  . Years since quitting: 11.9  Smokeless Tobacco Never Used  Patient has failed these meds in past: Anoro, Tudorza,  Patient is currently controlled on the following medications:   Proair HFA  Trelegy Ellipta   Prednisone 10mg   Using maintenance inhaler regularly? Yes Frequency of rescue inhaler use:  infrequently  We discussed:  He is rarely having to use rescue inhaler  For ADL's his breathing has been well controlled recently  If he does anything strenuous he does get SOB  Counseled on appropriate use of rescue inhaler, etc.  He has not had to double up on prednisone due to any exacerbations recently  Does mention some issues with copay on Trelegy about August every year when he enters the donut hole  Provided them with my direct # to contact me so that I can help them out with assistance if possible  Plan  Continue current medications  Possible PAP for trelegy, will look into samples   Hyperlipidemia   Lipid Panel     Component Value Date/Time   CHOL 261 (H) 11/11/2019 0924   TRIG 149 11/11/2019 0924   HDL 100  11/11/2019 0924   LDLCALC 133 (H) 11/11/2019 0924     The ASCVD Risk score (Mantoloking., et al., 2013) failed to calculate for the following reasons:   The 2013 ASCVD risk score is only valid for ages 16 to 56   Patient has failed these meds in past: simvastatin 10mg , pravastatin 20mg  Patient is currently uncontrolled on the following medications:  . No medications at this time  We discussed:    Ok with current lipids given his age  Patient will have updated lab work in march when he returns for his physical  Will follow up after that visit to discuss labwork if necessary  Plan  Continue current lifestyle Recommend recheck at CPE in March Hypothyroidism   Lab Results  Component Value Date/Time   TSH 1.104 02/16/2020 06:21 PM   TSH 1.59 11/11/2019 09:24 AM   TSH 1.44 01/27/2019 11:13 AM   TSH 1.96 07/17/2018 10:17 AM    Patient has failed these meds in past: none noted Patient is currently controlled on the following medications:  . Levothyroxine 43mcg  We discussed:  Reports adherence and appropriate timing  TSH normal  Recommend repeat lab at CPE in March  Plan  Continue current medications  BPH    Patient has failed these meds in past: silodosin 8mg  Patient is currently controlled on the following medications:   tamsulosin 0.4mg   No concerns with this medication per patient, no symptoms present.  Plan  Continue current medications   Vaccines   Reviewed and discussed patient's vaccination history.    Immunization History  Administered Date(s) Administered  . Fluad Quad(high Dose 65+) 05/13/2019, 07/12/2020  . Influenza Whole 05/30/2011, 06/17/2012  . Influenza, High Dose Seasonal PF 05/28/2017, 06/19/2018  . Influenza,inj,Quad PF,6+ Mos 05/28/2013, 06/10/2014, 06/17/2015, 06/22/2016  . PFIZER SARS-COV-2 Vaccination 10/06/2019, 10/26/2019  . Pneumococcal Conjugate-13 07/20/2014  . Pneumococcal Polysaccharide-23 06/19/2012     Plan  Recommended patient receive shingrix vaccine in office/pharmacy.   Medication Management   . Miscellaneous medications: zolpidem 5mg , tramadol 50mg , lorazepam 0.5mg ,  . OTC's: Mucinex, calcium gluconate 500mg  . Patient currently uses [facility].  Phone #  [phone] . Patient reports using pill vials lined up to organize medications and promote adherence. . Patient denies missed doses of medication.   Beverly Milch, PharmD Clinical Pharmacist [place] [phone]

## 2020-08-29 ENCOUNTER — Other Ambulatory Visit: Payer: Self-pay

## 2020-08-29 ENCOUNTER — Ambulatory Visit: Payer: PPO | Admitting: Internal Medicine

## 2020-08-29 ENCOUNTER — Encounter: Payer: Self-pay | Admitting: Internal Medicine

## 2020-08-29 DIAGNOSIS — J449 Chronic obstructive pulmonary disease, unspecified: Secondary | ICD-10-CM | POA: Diagnosis not present

## 2020-08-29 DIAGNOSIS — J9611 Chronic respiratory failure with hypoxia: Secondary | ICD-10-CM

## 2020-08-29 MED ORDER — TRELEGY ELLIPTA 100-62.5-25 MCG/INH IN AEPB: 1.0000 | INHALATION_SPRAY | Freq: Every day | RESPIRATORY_TRACT | 0 refills | Status: DC

## 2020-08-29 NOTE — Patient Instructions (Addendum)
Plan A = Automatic = Always=    Trelegy one click each am       Plan B = Backup (to supplement plan A, not to replace it) Only use your albuterol inhaler as a rescue medication to be used if you can't catch your breath by resting or doing a relaxed purse lip breathing pattern.  - The less you use it, the better it will work when you need it. - Ok to use the inhaler up to 2 puffs  every 4 hours if you must but call for appointment if use goes up over your usual need - Don't leave home without it !!  (think of it like the spare tire for your car)   Plan C = Crisis (instead of Plan B but only if Plan B stops working) - only use your albuterol nebulizer if you first try Plan B and it fails to help > ok to use the nebulizer up to every 4 hours but if start needing it regularly call for immediate appointment   Plan D = deltasone if losing ground and needing B and  C - double prednisone until better then 1 daily   For cough/ congestion > mucinex up to 1200 mg  twice and use the flutter as much as possible    Please schedule a follow up visit in 6 months but call sooner if needed

## 2020-08-29 NOTE — Progress Notes (Signed)
John Black, male    DOB: [birth year],     [name]: [id number]   Brief patient profile:  64 yowm quit smoking in 2010 then major aspiration event? MRSA then dx with GOLD II copd as of 2015 prev followed by Dr John Black s/p admit:  Admit date: 02/16/2020 Discharge date: 02/19/2020  Discharge Diagnoses:  Principal Problem:   COPD with acute exacerbation ([place])   Hypothyroidism   BPH (benign prostatic hyperplasia)   Acute on chronic respiratory failure with hypoxia (HCC)   Sepsis ([place])   SOB (shortness of breath)  History of present illness:  86 y.o.malewith medical history significant ofCOPD/emphysema, chronic respiratory failure on home oxygen, hypothyroidism presenting with complaints of shortness of breath.Patient reports 3 to 4-day history of progressively worsening dyspnea. He is also wheezing but not coughing much. Denies chest pain. Denies fevers or chills. States he uses 3 to 4 L supplemental oxygen at home all the time.  He was admitted with fever and shortness of breath.  He was found to have a COPD exacerbation.  He's improved with steroids, abx, and scheduled and prn nebs.  6/4 appeared improved and was discharged with plan for outpatient follow up.  Hospital Course:  Acute on chronic hypoxic respiratory failure, suspect secondary to acute COPD exacerbation: CT PE protocol without PE, no acute intrathoracic pathology, advanced emphysema Discharge with steroid taper, complete course of azithromycin, resume home nebs Ceftriaxone x3 days.  Complete 5 day total course of azithromycin.  Discharged with 2 days azithromycin.  Home theophylline Stable on home oxygen, maintained O2 sats with 4 L  Will need home O2 screen prior to d/c- maintained O2 sats 90% on home 4 L today Negative COVID testing, follow RVP negative  Sinus Tachycardia: unclear etiology? Possibly 2/2 nebs. HR up to 120-130 with ambulation with therapy. - on review of previous outpatient notes, he was  tachycardic to 120 on 01/22/20 visit and to 108 at 09/23/19 visit.  Possible chronic component. Daughter notes his HR usually in 80's-90's on pulse ox at home.  EKG and tele show sinus tach. Normal TSH from admission. Echo with grade II diastolic dysfunction, normal EF (see report) -> follow outpatient.  Mild to moderate Dilatation of the ascending aorta: 39 cm on echo - follow outpatient for surveillance   Febrile Illness: With COPD exacerbation, respiratory distress, suspect he had  viral illness leading to exacerbation.     GERD -Continue PPI  BPH -Continue Flomax  Anemia: suspect component of dilution, repeat H/H  Procedures: Echo IMPRESSIONS    1. Left ventricular ejection fraction, by estimation, is 60 to 65%. The  left ventricle has normal function. The left ventricle has no regional  wall motion abnormalities. Left ventricular diastolic parameters are  consistent with Grade II diastolic  dysfunction (pseudonormalization).  2. Right ventricular systolic function is normal. The right ventricular  size is normal.  3. The mitral valve is normal in structure. Mild mitral valve  regurgitation. No evidence of mitral stenosis.  4. The aortic valve is normal in structure. Aortic valve regurgitation is  not visualized. No aortic stenosis is present.  5. Aortic dilatation noted. There is mild to moderate dilatation of the  ascending aorta measuring 39 mm.        History of Present Illness  04/14/2020  Pulmonary/ 1st office eval/ John Black / John Black Office / trelegy / prednisone 10  X years  Chief Complaint  Patient presents with  . Consult    shortness of breath  with exertion  Dyspnea:  MMRC3 = can't walk 100 yards even at a slow pace at a flat grade s stopping due to sob - uses w/c a lot Cough: hoarse, some choking with swallowing  Sleep: bed is flat couple of pillows SABA use: neb in am 02 4 lpm hs and 3-4 lpm with sitting/ walking  rec Stop uniphyl  Continue  protonix Take 30-60 min before first meal of the day  GERD  Diet  If breathing gets worse double the prednisone until better then back to 10 mg daily  Make sure you check your oxygen saturations at highest level of activity to be sure it stays over 90% and adjust upward to maintain this level if needed but remember to turn it back to previous settings when you stop (to conserve your supply).  Please schedule a follow up office visit in 6 weeks, call sooner if needed - bring inhalers         05/26/2020  f/u ov/John Black office/John Black re:  GOLD 3/ 02 dep on trelegy / preferred symbicort/ maint pred at 10 mg daily  Chief Complaint  Patient presents with  . Follow-up    shortness of breath with activity  Dyspnea: weak and sob limited to  room to room at home with cane   Cough: none  Sleeping: bed is flat/ 2 pillows SABA use: nebulizer is being using as backup frequently  02: up to 5lpm  rec Plan A = Automatic = Always=    Breztri Take 2 puffs first thing in am and then another 2 puffs about 12 hours later.  Work on inhaler technique:   Plan B = Backup (to supplement plan A, not to replace it) Only use your albuterol inhaler as a rescue medication   Plan C = Crisis (instead of Plan B but only if Plan B stops working) - only use your albuterol nebulizer if you first try Plan B and it fails to help > ok to use the nebulizer up to every 4 hours but if start needing it regularly call for immediate appointment Plan D = deltasone if losing ground and needing B and  C - double prednisone until better then 1 daily      06/21/2020  f/u ov/John Black office/John Black re:  GOLD III / 02 dep on breztri/ pred 10 mg Chief Complaint  Patient presents with  . Acute Visit  Dyspnea:  Room to room  Cough: more copious/ white / not using max mucinex/ no using flutter as rec  Sleeping: flat bed two pillows SABA use: not using hfa/ neb not understanding when to use  Vs prednisone 20 as plan D above  02: 3-5 lpm  titrates  rec Plan A = Automatic = Always=    Trelegy one click each am    Plan B = Backup (to supplement plan A, not to replace it) Only use your albuterol inhaler as a rescue medication Plan C = Crisis (instead of Plan B but only if Plan B stops working) - only use your albuterol nebulizer if you first try Plan B  Plan D = deltasone if losing ground and needing B and  C - double prednisone until better then 1 daily  For cough/ congestion > mucinex up to 1200 mg  twice and use the flutter as much as possible        08/29/2020  f/u ov/[place] office/Ilena Dieckman re:  COPD with GOLD III   spirometry / 02 dep @  24/7 and  prednisone 10 mg daily  X years  Chief Complaint  Patient presents with  . Follow-up    Chest congestion in past week  Dyspnea:   Room to room at home on 4lpm  Cough: none Sleeping: flat bed with 2 pillows  SABA use: very little  02: 4lpm 24/7    No obvious day to day or daytime variability or assoc excess/ purulent sputum or mucus plugs or hemoptysis or cp or chest tightness, subjective wheeze or overt sinus or hb symptoms.   Sleeping as above  without nocturnal  or early am exacerbation  of respiratory  c/o's or need for noct saba. Also denies any obvious fluctuation of symptoms with weather or environmental changes or other aggravating or alleviating factors except as outlined above   No unusual exposure hx or h/o childhood pna/ asthma or knowledge of premature birth.  Current Allergies, Complete Past Medical History, Past Surgical History, Family History, and Social History were reviewed in [employer] record.  ROS  The following are not active complaints unless bolded Hoarseness, sore throat, dysphagia, dental problems, itching, sneezing,  nasal congestion or discharge of excess mucus or purulent secretions, ear ache,   fever, chills, sweats, unintended wt loss or wt gain, classically pleuritic or exertional cp,  orthopnea pnd or arm/hand  swelling  or leg swelling, presyncope, palpitations, abdominal pain, anorexia, nausea, vomiting, diarrhea  or change in bowel habits or change in bladder habits, change in stools or change in urine, dysuria, hematuria,  rash, arthralgias, visual complaints, headache, numbness, weakness or ataxia or problems with walking or coordination,  change in mood or  memory.        Current Meds  Medication Sig  . acetaminophen (TYLENOL) 325 MG tablet Take 650 mg by mouth every 6 (six) hours as needed for mild pain.  Marland Kitchen albuterol (PROAIR HFA) 108 (90 Base) MCG/ACT inhaler 2 puffs every 4 hours as needed only  if your can't catch your breath  . albuterol (PROVENTIL) (2.5 MG/3ML) 0.083% nebulizer solution UE 1 VIAL IN NEBULIZER EVERY 6 HOURS AS NEEDED FOR WHEEZING/SOB  . calcium gluconate 500 MG tablet Take 1 tablet by mouth 3 (three) times daily.  . Fluticasone-Umeclidin-Vilant (TRELEGY ELLIPTA) 100-62.5-25 MCG/INH AEPB Inhale 1 puff into the lungs daily.  Marland Kitchen guaiFENesin (MUCINEX) 600 MG 12 hr tablet Take 600 mg by mouth 2 (two) times daily.   Marland Kitchen levothyroxine (SYNTHROID) 75 MCG tablet TAKE 1 TABLET (75 MCG TOTAL) BY MOUTH DAILY.  Marland Kitchen LORazepam (ATIVAN) 0.5 MG tablet TAKE 1 TABLET (0.5 MG TOTAL) BY MOUTH 2 (TWO) TIMES DAILY AS NEEDED.  . mirtazapine (REMERON) 15 MG tablet TAKE 1/2 TABLET BY MOUTH IN THE EVENING  . pantoprazole (PROTONIX) 40 MG tablet TAKE 1 TABLET BY MOUTH EVERY DAY  . predniSONE (DELTASONE) 10 MG tablet TAKE 1 TABLET (10 MG TOTAL) BY MOUTH DAILY WITH BREAKFAST.  Marland Kitchen Respiratory Therapy Supplies (FLUTTER) DEVI Use as directed.  . tamsulosin (FLOMAX) 0.4 MG CAPS capsule TAKE 1 CAPSULE BY MOUTH EVERY DAY  . traMADol (ULTRAM) 50 MG tablet TAKE 1 TABLET (50 MG TOTAL) BY MOUTH EVERY 8 (EIGHT) HOURS AS NEEDED. FOR PAIN  . zolpidem (AMBIEN) 5 MG tablet TAKE 1 TABLET BY MOUTH EVERY DAY AT BEDTIME AS NEEDED FOR SLEEP          Past Medical History:  Diagnosis Date  . Allergic rhinitis   . Cataract     s/p removal  . Chronic respiratory failure ([place])  oxygen 3L at home  . Emphysema   . Hypothyroid   . PNA (pneumonia)   . Prostate cancer ([place])        Objective:    08/29/2020     124   06/21/2020       115  05/26/20 112 lb 3.2 oz (50.9 kg)  04/14/20 112 lb 12.8 oz (51.2 kg)    W/c bound elderly wm nad   Vital signs reviewed  08/29/2020  - Note at rest 02 sats  90% on 4lpm and desats into 80's immediately p turned off at rest       HEENT : pt wearing mask not removed for exam due to covid -19 concerns.    NECK :  without JVD/Nodes/TM/ nl carotid upstrokes bilaterally   LUNGS: no acc muscle use,  Mod barrel  contour chest wall with bilateral  Distant bs s audible wheeze and  without cough on insp or exp maneuvers and mod  Hyperresonant  to  percussion bilaterally     CV:  RRR  no s3 or murmur or increase in P2, and no edema   ABD:  soft and nontender with pos mid insp Hoover's  in the supine position. No bruits or organomegaly appreciated, bowel sounds nl  MS:     ext warm without deformities, calf tenderness, cyanosis or clubbing No obvious joint restrictions   SKIN: warm and dry without lesions    NEURO:  alert, approp, nl sensorium with  no motor or cerebellar deficits apparent.                 Assessment

## 2020-08-29 NOTE — Assessment & Plan Note (Signed)
04/14/2020   Walked RA  approx   300 ft  @ slow pace  stopped due to  End of study with sats 90% dropping to 88% at end   As of 08/29/2020 rec 4lpm 24/7          Each maintenance medication was reviewed in detail including emphasizing most importantly the difference between maintenance and prns and under what circumstances the prns are to be triggered using an action plan format where appropriate.  Total time for H and P, chart review, counseling, reviewing  device and generating customized AVS unique to this office visit / charting = 25 min

## 2020-08-29 NOTE — Assessment & Plan Note (Signed)
Quit smoking  2010 - daily pred since ? 2010?  - PFT's 12/17/13  FEV1 1.17 (57 % ) ratio 0.36  p 6 % improvement from saba p ? prior to study with DLCO  11.56 (47%) corrects to 2.19 (52%)  for alv volume and FV curve classic exp concavity on exp loop and air trapping - d/c theph 04/14/2020 and max rx for gerd  - 05/26/2020   try change trelegy to breztri as prev preferred symbicort - 06/21/2020 changed back to trelegy at pt request - 06/21/2020  After extensive coaching inhaler device,  effectiveness =    75% with hfa > continue with proair hfa as Plan B    Group D in terms of symptom/risk and laba/lama/ICS  therefore appropriate rx at this point >>>  Continue trelegy/ daily prednsone at 10 mg and prn saba   Re saba: I spent extra time with pt today reviewing appropriate use of albuterol for prn use on exertion with the following points: 1) saba is for relief of sob that does not improve by walking a slower pace or resting but rather if the pt does not improve after trying this first. 2) If the pt is convinced, as many are, that saba helps recover from activity faster then it's easy to tell if this is the case by re-challenging : ie stop, take the inhaler, then p 5 minutes try the exact same activity (intensity of workload) that just caused the symptoms and see if they are substantially diminished or not after saba 3) if there is an activity that reproducibly causes the symptoms, try the saba 15 min before the activity on alternate days   If in fact the saba really does help, then fine to continue to use it prn but advised may need to look closer at the maintenance regimen being used to achieve better control of airways disease with exertion.   Re prednisone: The goal with a chronic steroid dependent illness is always arriving at the lowest effective dose that controls the disease/symptoms and not accepting a set "formula" which is based on statistics or guidelines that don't always take into account  patient  variability or the natural hx of the dz in every individual patient, which may well vary over time.  For now therefore I recommend the patient maintain  Ceiling of 20 mg and floor of 10 mg daily

## 2020-09-05 ENCOUNTER — Other Ambulatory Visit: Payer: Self-pay

## 2020-09-05 ENCOUNTER — Ambulatory Visit: Payer: PPO | Admitting: Pharmacist

## 2020-09-05 DIAGNOSIS — J449 Chronic obstructive pulmonary disease, unspecified: Secondary | ICD-10-CM

## 2020-09-05 DIAGNOSIS — E785 Hyperlipidemia, unspecified: Secondary | ICD-10-CM

## 2020-09-05 DIAGNOSIS — E039 Hypothyroidism, unspecified: Secondary | ICD-10-CM

## 2020-09-05 NOTE — Patient Instructions (Addendum)
Visit Information  Goals Addressed            This Visit's Progress   . Pharmacy Care Plan:       CARE PLAN ENTRY  Current Barriers:  . Chronic Disease Management support, education, and care coordination needs related to COPD and Hypothyroidism   COPD . Pharmacist Clinical Goal(s) o Over the next 120 days, patient will work with PharmD and providers to optimize medication regimen and minimize symptoms related to COPD. . Current regimen:  o Ventolin HFA o Trelegy Ellipta o Prednisone 10mg  . Interventions: o Counseled on use of rescue inhaler, nebulizers o Evaluated for exacerbations and limitations of ADLs o Discussed need for assistance with Trelegy copay . Patient self care activities - Over the next 120 days, patient will: o Continue to focus on medication adherence and daily use of maintenance inhaler o Contact PharmD or PCP with any increase in SOB or other breathing related symptoms o Contact providers with any needs regarding Trelegy    Hypothyroidism . Pharmacist Clinical Goal(s) o Over the next 120 days, patient will work with PharmD and providers to optimize medication related to hypothhyroidism. . Current regimen:  o Levothyroxine 42mcg . Interventions: o Comprehensive medication review o Reviewed recent lab values for TSH o Continued current therapy . Patient self care activities - Over the next 120 days, patient will: o Continue to focus on medication adherence by pill count o Present to CPE in march for updated lab work  Medication Assistance:  Current Barriers:  . 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  Pharmacist Clinical Goal(s):  Marland Kitchen Over the next 30 days, patient will work with PharmD and providers to relieve medication access concerns  Interventions: . Comprehensive medication review completed; medication list updated in electronic  medical record.  Bertram Savin care team collaboration (see longitudinal plan of care) . PAP documentation by PharmD: Patient to be evaluated for out of pocket spend criteria for this medication's patient assistance program. Reviewed application process. Patient will provide proof of income, out of pocket spend report, and will sign application. Will collaborate with primary care provider Dr. Lamonte Sakai for their portion of application. Once completed, will submit to [place] patient assistance program. . Informed patient to contact me regarding samples   Patient Self Care Activities:  . Patient will provide necessary portions of application     Please see past updates related to this goal by clicking on the "Past Updates" button in the selected goal         The patient verbalized understanding of instructions, educational materials, and care plan provided today and agreed to receive a mailed copy of patient instructions, educational materials, and care plan.   Telephone follow up appointment with pharmacy team member scheduled for: 4 months  Beverly Milch, PharmD Clinical Pharmacist [facility] [phone]   COPD and Physical Activity Chronic obstructive pulmonary disease (COPD) is a long-term (chronic) condition that affects the lungs. COPD is a general term that can be used to describe many different lung problems that cause lung swelling (inflammation) and limit airflow, including chronic bronchitis and emphysema. The main symptom of COPD is shortness of breath, which makes it harder to do even simple tasks. This can also make it harder to exercise and be active. Talk with your health care provider about treatments to help you breathe better and actions you can take to  prevent breathing problems during physical activity. What are the benefits of exercising with COPD? Exercising regularly is an important part of a healthy lifestyle. You can still exercise and do  physical activities even though you have COPD. Exercise and physical activity improve your shortness of breath by increasing blood flow (circulation). This causes your heart to pump more oxygen through your body. Moderate exercise can improve your:  Oxygen use.  Energy level.  Shortness of breath.  Strength in your breathing muscles.  Heart health.  Sleep.  Self-esteem and feelings of self-worth.  Depression, stress, and anxiety levels. Exercise can benefit everyone with COPD. The severity of your disease may affect how hard you can exercise, especially at first, but everyone can benefit. Talk with your health care provider about how much exercise is safe for you, and which activities and exercises are safe for you. What actions can I take to prevent breathing problems during physical activity?  Sign up for a pulmonary rehabilitation program. This type of program may include: ? Education about lung diseases. ? Exercise classes that teach you how to exercise and be more active while improving your breathing. This usually involves:  Exercise using your lower extremities, such as a stationary bicycle.  About 30 minutes of exercise, 2 to 5 times per week, for 6 to 12 weeks  Strength training, such as push ups or leg lifts. ? Nutrition education. ? Group classes in which you can talk with others who also have COPD and learn ways to manage stress.  If you use an oxygen tank, you should use it while you exercise. Work with your health care provider to adjust your oxygen for your physical activity. Your resting flow rate is different from your flow rate during physical activity.  While you are exercising: ? Take slow breaths. ? Pace yourself and do not try to go too fast. ? Purse your lips while breathing out. Pursing your lips is similar to a kissing or whistling position. ? If doing exercise that uses a quick burst of effort, such as weight lifting:  Breathe in before starting the  exercise.  Breathe out during the hardest part of the exercise (such as raising the weights). Where to find support You can find support for exercising with COPD from:  Your health care provider.  A pulmonary rehabilitation program.  Your local health department or community health programs.  Support groups, online or in-person. Your health care provider may be able to recommend support groups. Where to find more information You can find more information about exercising with COPD from:  American Lung Association: ClassInsider.se.  COPD Foundation: https://www.rivera.net/. Contact a health care provider if:  Your symptoms get worse.  You have chest pain.  You have nausea.  You have a fever.  You have trouble talking or catching your breath.  You want to start a new exercise program or a new activity. Summary  COPD is a general term that can be used to describe many different lung problems that cause lung swelling (inflammation) and limit airflow. This includes chronic bronchitis and emphysema.  Exercise and physical activity improve your shortness of breath by increasing blood flow (circulation). This causes your heart to provide more oxygen to your body.  Contact your health care provider before starting any exercise program or new activity. Ask your health care provider what exercises and activities are safe for you. This information is not intended to replace advice given to you by your health care provider. Make sure  you discuss any questions you have with your health care provider. Document Revised: 12/24/2018 Document Reviewed: 09/26/2017 Elsevier Patient Education  2020 [employer].

## 2020-09-08 ENCOUNTER — Encounter (HOSPITAL_COMMUNITY): Payer: Self-pay | Admitting: Emergency Medicine

## 2020-09-08 ENCOUNTER — Observation Stay (HOSPITAL_COMMUNITY)
Admission: EM | Admit: 2020-09-08 | Discharge: 2020-09-09 | Disposition: A | Discharge status: Home / Self Care | Payer: PPO | Attending: Internal Medicine | Admitting: Internal Medicine

## 2020-09-08 ENCOUNTER — Emergency Department (HOSPITAL_COMMUNITY): Payer: PPO

## 2020-09-08 ENCOUNTER — Other Ambulatory Visit: Payer: Self-pay

## 2020-09-08 DIAGNOSIS — J9611 Chronic respiratory failure with hypoxia: Secondary | ICD-10-CM | POA: Diagnosis present

## 2020-09-08 DIAGNOSIS — J441 Chronic obstructive pulmonary disease with (acute) exacerbation: Secondary | ICD-10-CM | POA: Diagnosis present

## 2020-09-08 DIAGNOSIS — I639 Cerebral infarction, unspecified: Secondary | ICD-10-CM | POA: Diagnosis not present

## 2020-09-08 DIAGNOSIS — G319 Degenerative disease of nervous system, unspecified: Secondary | ICD-10-CM | POA: Diagnosis not present

## 2020-09-08 DIAGNOSIS — Z87891 Personal history of nicotine dependence: Secondary | ICD-10-CM | POA: Insufficient documentation

## 2020-09-08 DIAGNOSIS — G459 Transient cerebral ischemic attack, unspecified: Secondary | ICD-10-CM | POA: Diagnosis not present

## 2020-09-08 DIAGNOSIS — Z20822 Contact with and (suspected) exposure to covid-19: Secondary | ICD-10-CM | POA: Diagnosis not present

## 2020-09-08 DIAGNOSIS — E039 Hypothyroidism, unspecified: Secondary | ICD-10-CM | POA: Diagnosis not present

## 2020-09-08 DIAGNOSIS — R29810 Facial weakness: Secondary | ICD-10-CM | POA: Diagnosis not present

## 2020-09-08 DIAGNOSIS — R0602 Shortness of breath: Secondary | ICD-10-CM | POA: Diagnosis not present

## 2020-09-08 DIAGNOSIS — N400 Enlarged prostate without lower urinary tract symptoms: Secondary | ICD-10-CM | POA: Diagnosis present

## 2020-09-08 DIAGNOSIS — J90 Pleural effusion, not elsewhere classified: Secondary | ICD-10-CM | POA: Diagnosis not present

## 2020-09-08 DIAGNOSIS — K4090 Unilateral inguinal hernia, without obstruction or gangrene, not specified as recurrent: Secondary | ICD-10-CM

## 2020-09-08 DIAGNOSIS — G4700 Insomnia, unspecified: Secondary | ICD-10-CM | POA: Diagnosis present

## 2020-09-08 DIAGNOSIS — R4701 Aphasia: Secondary | ICD-10-CM | POA: Diagnosis not present

## 2020-09-08 DIAGNOSIS — Z8546 Personal history of malignant neoplasm of prostate: Secondary | ICD-10-CM | POA: Diagnosis not present

## 2020-09-08 DIAGNOSIS — J439 Emphysema, unspecified: Secondary | ICD-10-CM | POA: Diagnosis not present

## 2020-09-08 DIAGNOSIS — R4781 Slurred speech: Secondary | ICD-10-CM | POA: Diagnosis present

## 2020-09-08 DIAGNOSIS — E785 Hyperlipidemia, unspecified: Secondary | ICD-10-CM | POA: Diagnosis present

## 2020-09-08 DIAGNOSIS — R000 Tachycardia, unspecified: Secondary | ICD-10-CM | POA: Diagnosis not present

## 2020-09-08 DIAGNOSIS — J3489 Other specified disorders of nose and nasal sinuses: Secondary | ICD-10-CM | POA: Diagnosis not present

## 2020-09-08 LAB — COMPREHENSIVE METABOLIC PANEL
ALT: 24 U/L (ref 0–44)
AST: 27 U/L (ref 15–41)
Albumin: 4.3 g/dL (ref 3.5–5.0)
Alkaline Phosphatase: 49 U/L (ref 38–126)
Anion gap: 10 (ref 5–15)
BUN: 24 mg/dL — ABNORMAL HIGH (ref 8–23)
CO2: 30 mmol/L (ref 22–32)
Calcium: 9.4 mg/dL (ref 8.9–10.3)
Chloride: 102 mmol/L (ref 98–111)
Creatinine, Ser: 0.94 mg/dL (ref 0.61–1.24)
GFR, Estimated: >60 mL/min (ref >60)
Glucose, Bld: 114 mg/dL — ABNORMAL HIGH (ref 70–99)
Potassium: 4.2 mmol/L (ref 3.5–5.1)
Sodium: 142 mmol/L (ref 135–145)
Total Bilirubin: 0.4 mg/dL (ref 0.3–1.2)
Total Protein: 7.2 g/dL (ref 6.5–8.1)

## 2020-09-08 LAB — CBC WITH DIFFERENTIAL/PLATELET
Abs Immature Granulocytes: 0.15 10*3/uL — ABNORMAL HIGH (ref 0.00–0.07)
Basophils Absolute: 0 10*3/uL (ref 0.0–0.1)
Basophils Relative: 0 %
Eosinophils Absolute: 0 10*3/uL (ref 0.0–0.5)
Eosinophils Relative: 0 %
HCT: 43.7 % (ref 39.0–52.0)
Hemoglobin: 14 g/dL (ref 13.0–17.0)
Immature Granulocytes: 2 %
Lymphocytes Relative: 26 %
Lymphs Abs: 2.3 10*3/uL (ref 0.7–4.0)
MCH: 30.5 pg (ref 26.0–34.0)
MCHC: 32 g/dL (ref 30.0–36.0)
MCV: 95.2 fL (ref 80.0–100.0)
Monocytes Absolute: 0.9 10*3/uL (ref 0.1–1.0)
Monocytes Relative: 10 %
Neutro Abs: 5.4 10*3/uL (ref 1.7–7.7)
Neutrophils Relative %: 62 %
Platelets: 328 10*3/uL (ref 150–400)
RBC: 4.59 MIL/uL (ref 4.22–5.81)
RDW: 14.4 % (ref 11.5–15.5)
WBC: 8.8 10*3/uL (ref 4.0–10.5)
nRBC: 0 % (ref 0.0–0.2)

## 2020-09-08 LAB — RESP PANEL BY RT-PCR (FLU A&B, COVID) ARPGX2
Influenza A by PCR: NEGATIVE
Influenza B by PCR: NEGATIVE
SARS Coronavirus 2 by RT PCR: NEGATIVE

## 2020-09-08 LAB — PROTIME-INR
INR: 0.9 (ref 0.8–1.2)
Prothrombin Time: 11.9 seconds (ref 11.4–15.2)

## 2020-09-08 IMAGING — DX DG CHEST 1V PORT
2 series · 2 of 2 positions shown · non-contrast
Comparison: [DATE]

CLINICAL DATA: Stroke-like symptoms, aphasia, shortness of breath

EXAM:
PORTABLE CHEST 1 VIEW

[chest ap (1 of 2)]
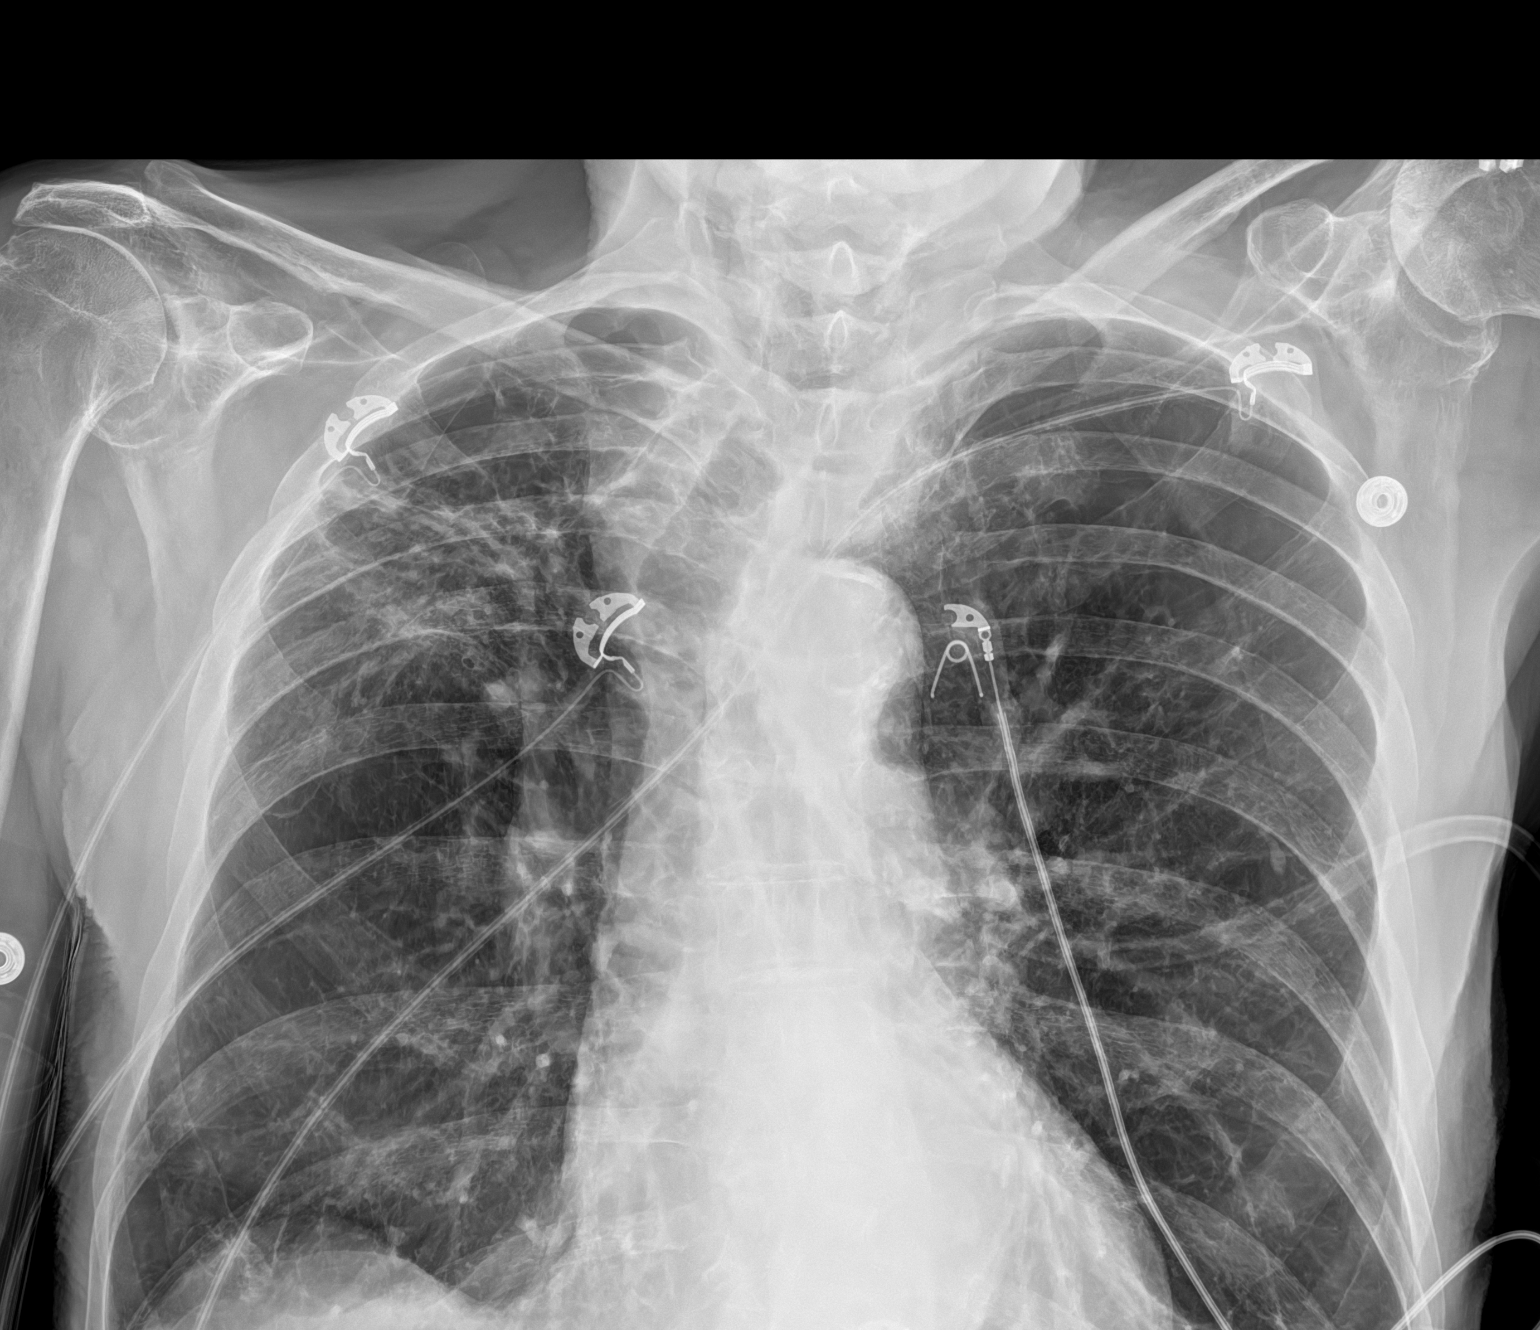

[chest ap (2 of 2)]
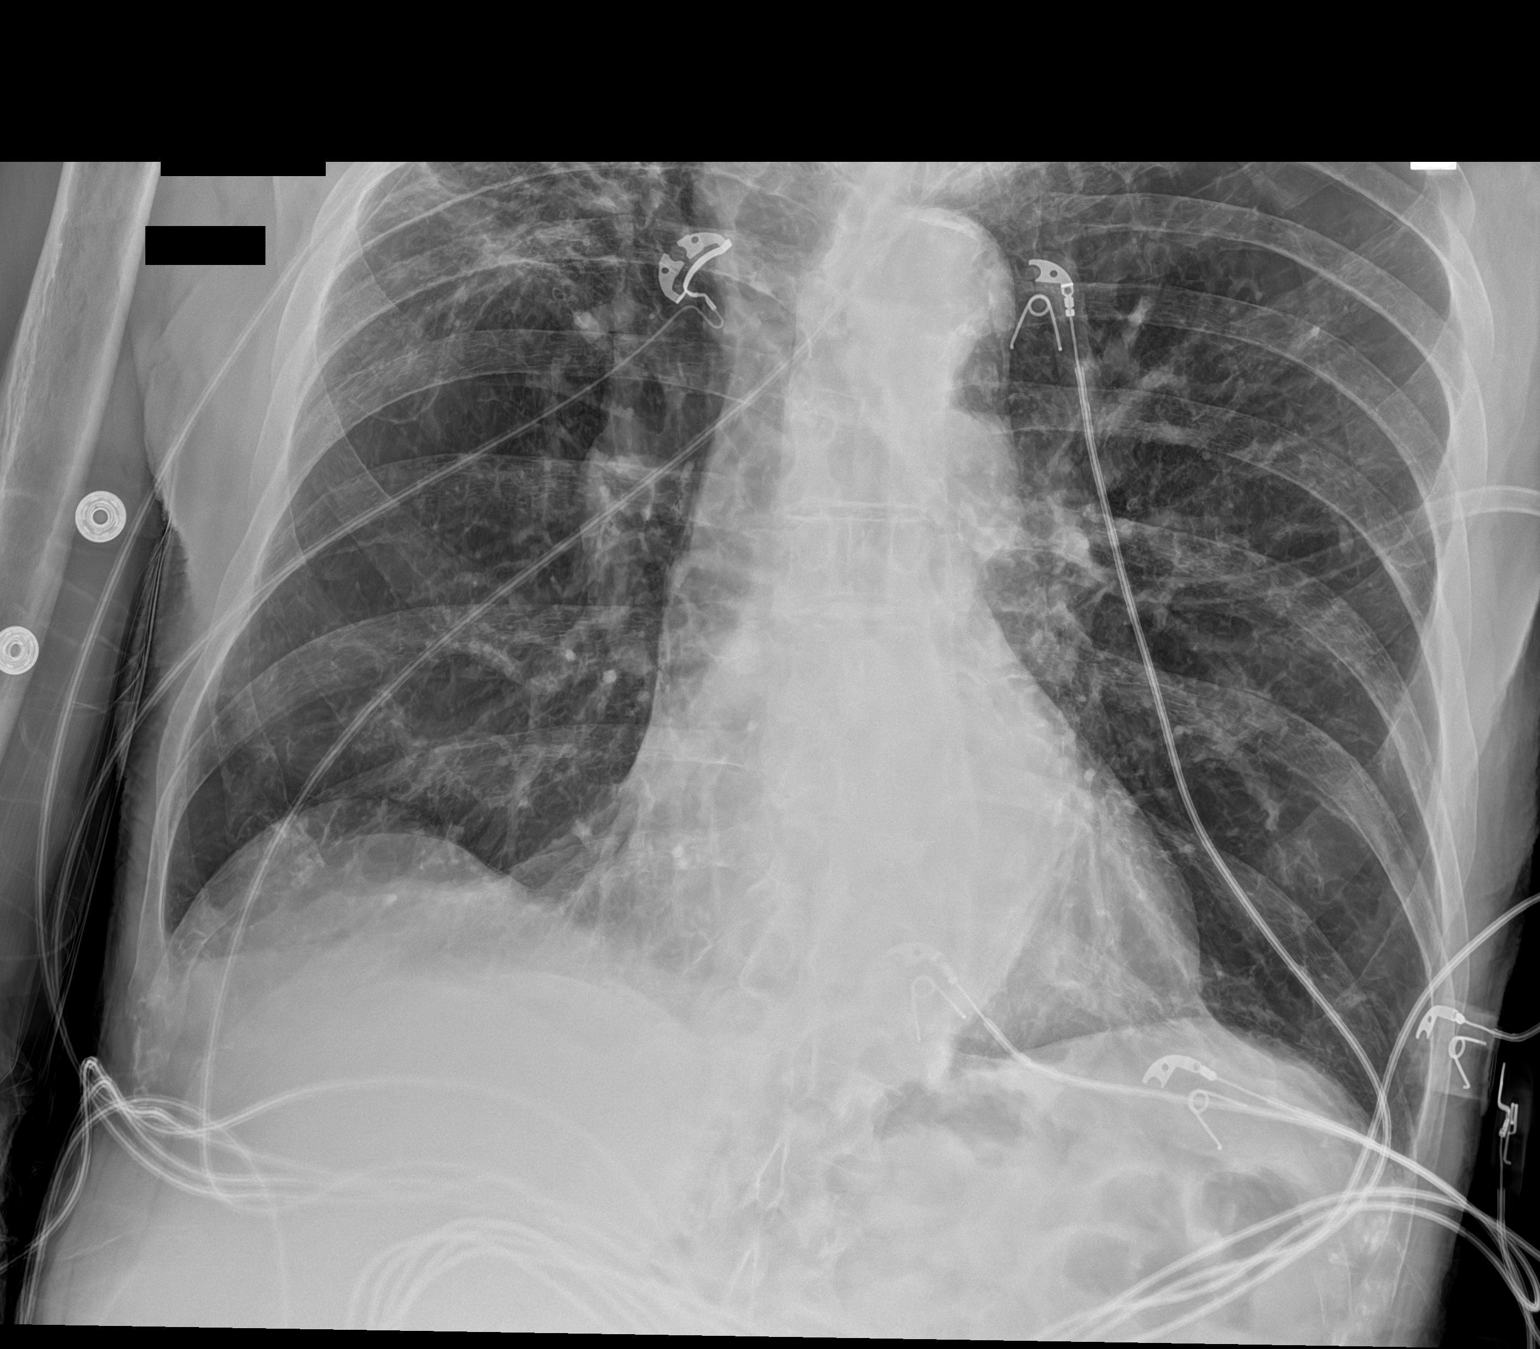

[2 of 2 positions shown; findings below may reference images not displayed]

FINDINGS: 2 frontal views of the chest demonstrate a stable cardiac
silhouette. Diffuse emphysema with chronic areas of scarring are
seen throughout the lungs, greatest in the right upper lobe. No
acute airspace disease, effusion, or pneumothorax. No acute bony
abnormalities.
IMPRESSION: 1. Stable emphysema and scarring.  No acute airspace disease.

## 2020-09-08 IMAGING — CT CT HEAD W/O CM
4 series · 16 of 47 positions shown, 18 images · non-contrast
Comparison: None.

CLINICAL DATA: Stroke-like symptoms including right-sided facial
droop, drooling, and aphasia.

EXAM:
CT HEAD WITHOUT CONTRAST
TECHNIQUE: Contiguous axial images were obtained from the base of the skull
through the vertex without intravenous contrast.

[Series 2: head w o · axial · 0.45mm/px · z∈[+1269,+1384]mm · 7 of 31 slices shown, 9 images]
[im 4/31  brain]
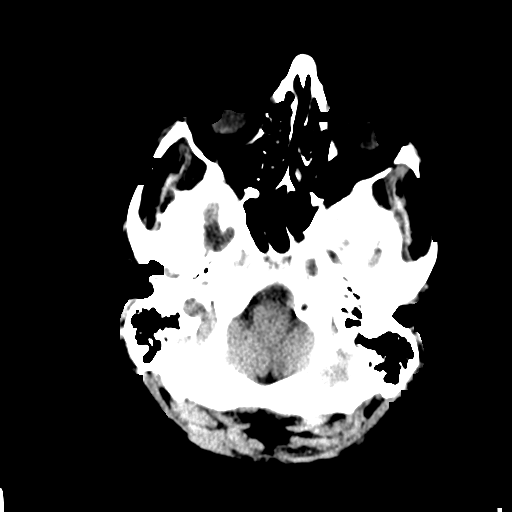
[im 4/31  bone]
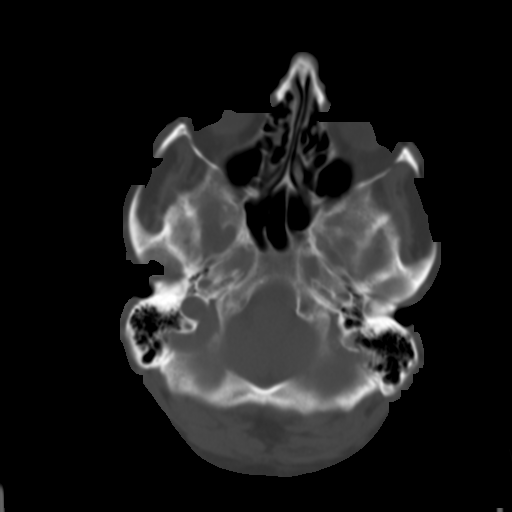
[im 8/31  brain]
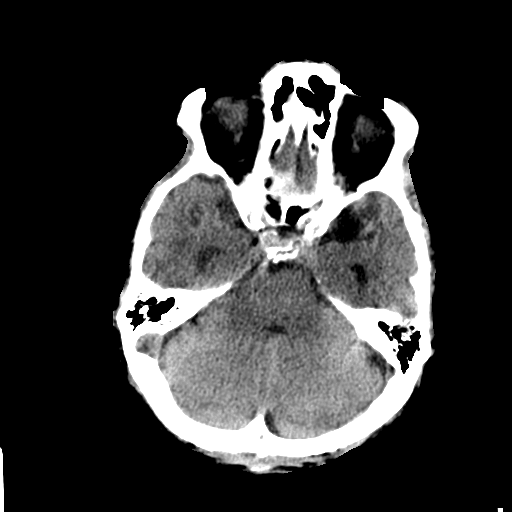
[im 12/31  brain]
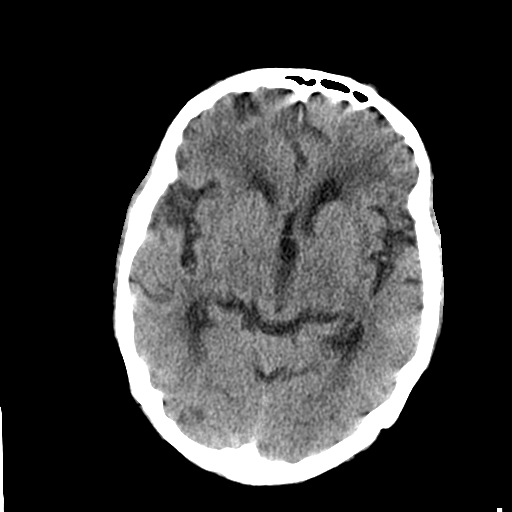
[im 16/31  brain]
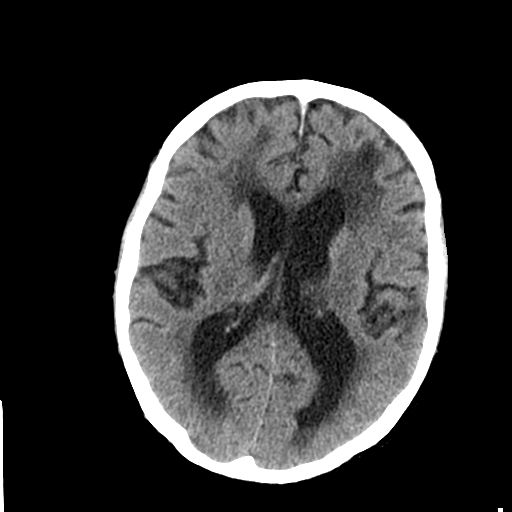
[im 19/31  brain]
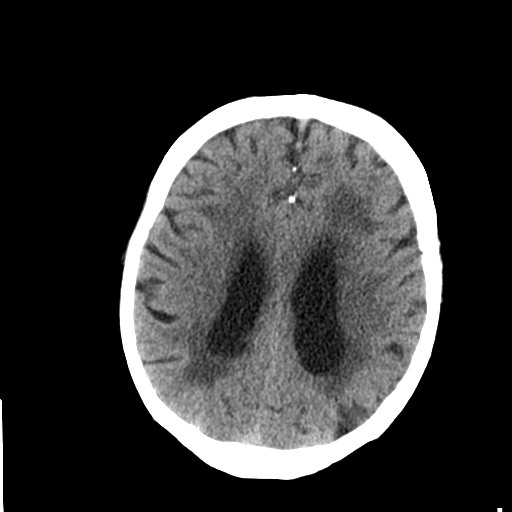
[im 19/31  bone]
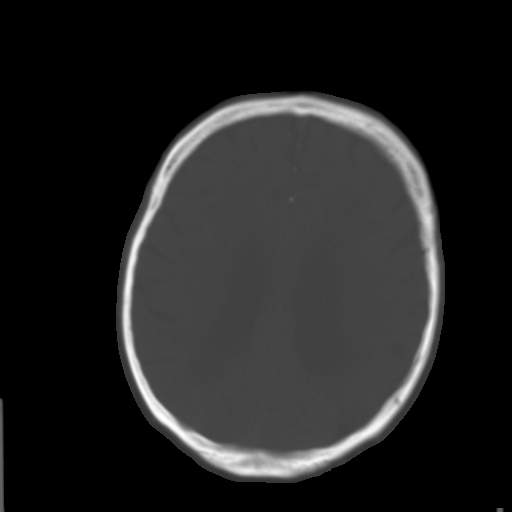
[im 23/31  brain]
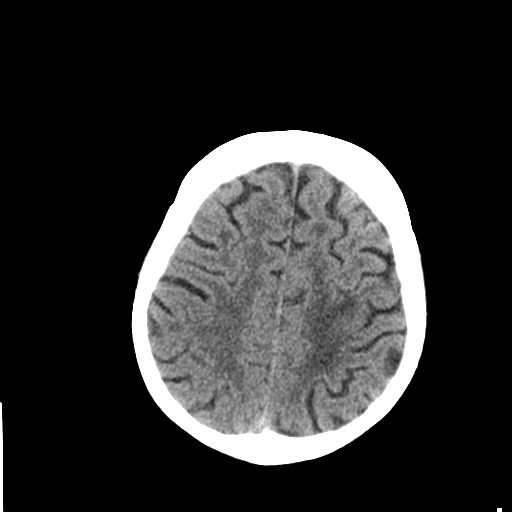
[im 27/31  brain]
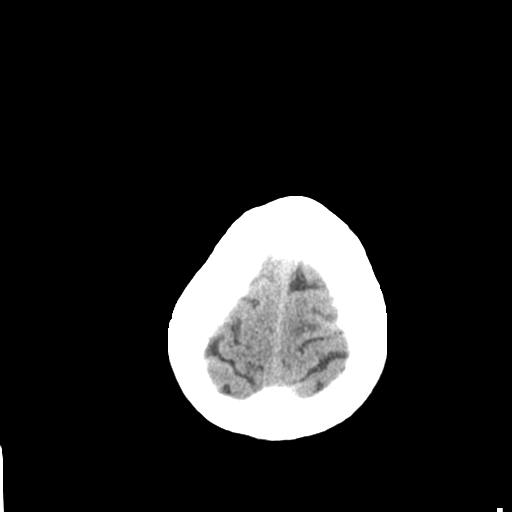

[Series 3: head bone · axial · 0.45mm/px · z∈[+1268,+1298]mm · 3 of 77 slices shown]
[im 8/77  bone]
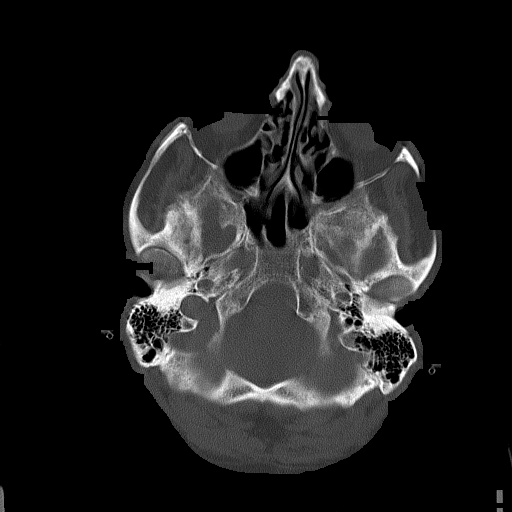
[im 16/77  bone]
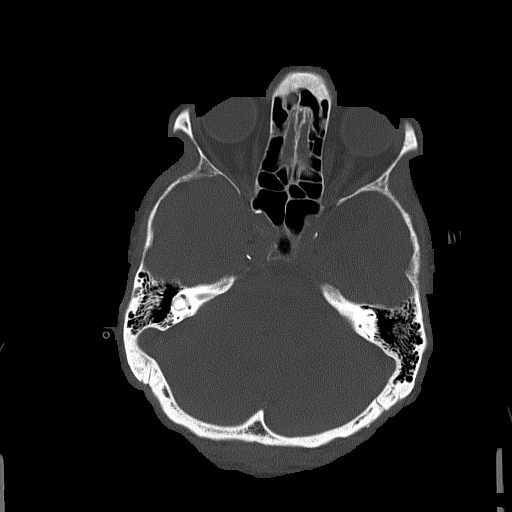
[im 23/77  bone]
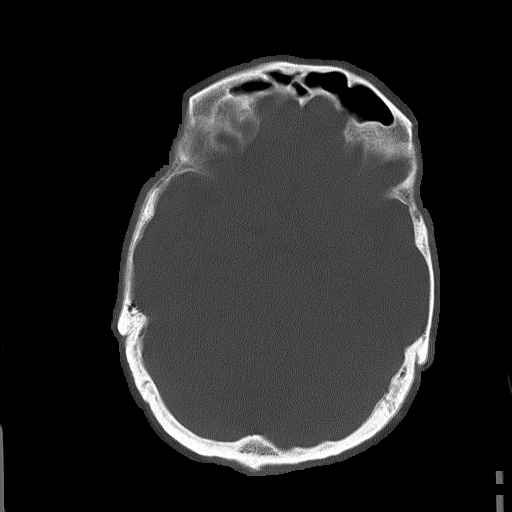

[Series 4: coronal soft · coronal · 0.30mm/px · 3 of 69 slices shown]
[im 23/69  brain]
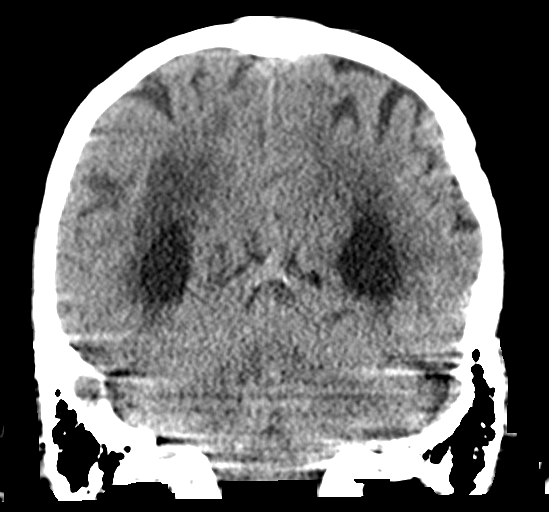
[im 31/69  brain]
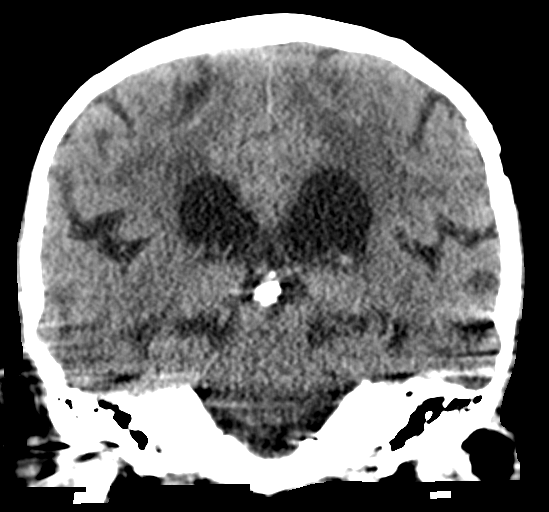
[im 38/69  brain]
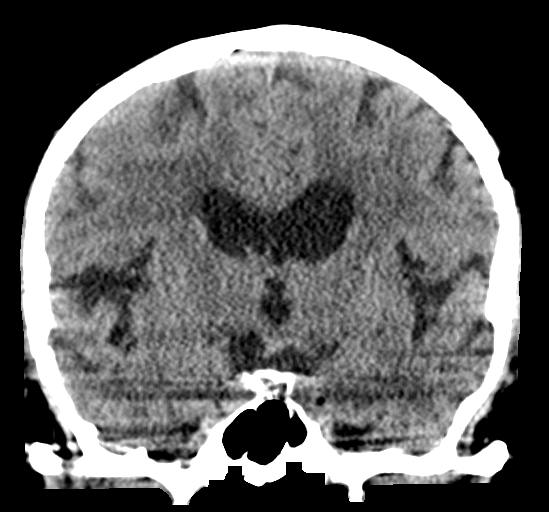

[Series 5: sagittal soft · sagittal · 0.30mm/px · 3 of 54 slices shown]
[im 18/54  brain]
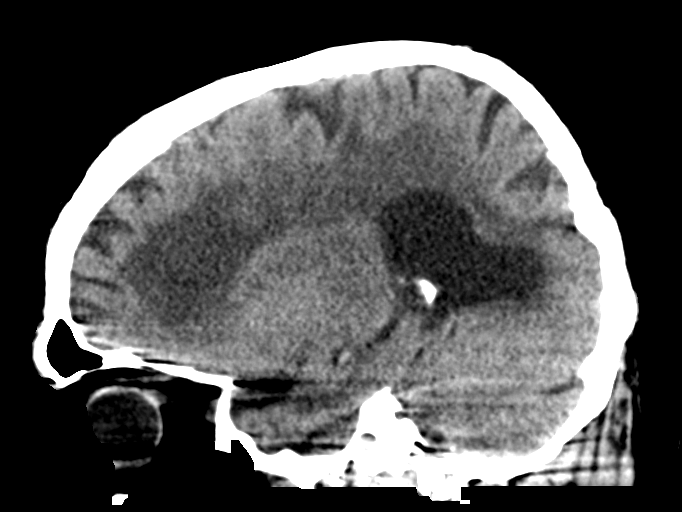
[im 27/54  brain]
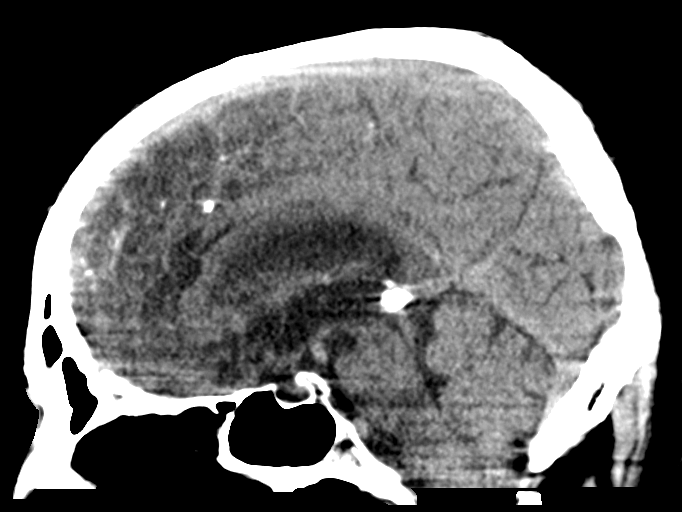
[im 36/54  brain]
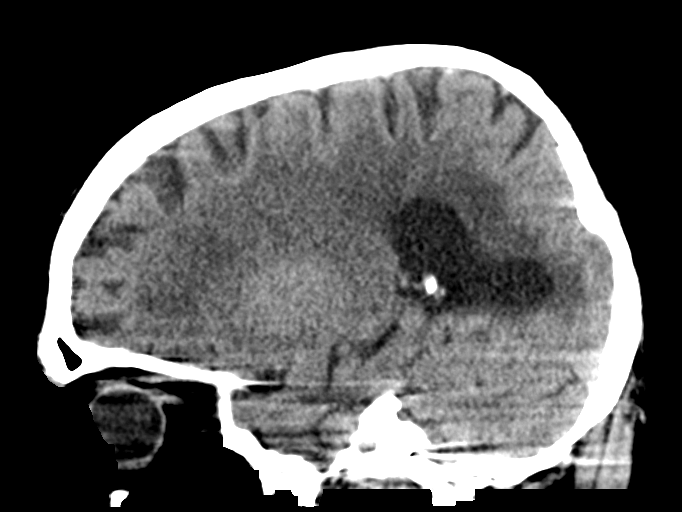

[16 of 47 positions shown; findings below may reference images not displayed]

FINDINGS: Mild motion artifact is noted through the skull base.

Brain: No acute large territory cortical infarct, intracranial
hemorrhage, mass, midline shift, or extra-axial fluid collection is
identified. Small cortical infarcts in the left frontal lobe, left
parietal lobe, and right occipital lobe are favored to be chronic.
Confluent hypodensities in the cerebral white matter bilaterally are
nonspecific but compatible with severe chronic small vessel ischemic
disease. There is mild-to-moderate cerebral atrophy.

Vascular: Calcified atherosclerosis at the skull base. No hyperdense
vessel.

Skull: No fracture or suspicious osseous lesion.

Sinuses/Orbits: Minimal mucosal thickening in the ethmoid sinuses.
Clear mastoid air cells. Bilateral cataract extraction.

Other: None.
IMPRESSION: 1. No definite evidence of an acute intracranial abnormality.
2. Small infarcts in the left frontal, left parietal, and right
occipital lobes, favored to be chronic.
3. Severe chronic small vessel ischemic disease.

## 2020-09-08 MED ORDER — ASPIRIN 81 MG PO CHEW
81.0000 mg | CHEWABLE_TABLET | Freq: Once | ORAL | Status: AC
  Administered 2020-09-08: 81 mg via ORAL
  Filled 2020-09-08: qty 1

## 2020-09-08 MED ORDER — ALBUTEROL SULFATE (2.5 MG/3ML) 0.083% IN NEBU
2.5000 mg | INHALATION_SOLUTION | Freq: Once | RESPIRATORY_TRACT | Status: AC
  Administered 2020-09-08: 2.5 mg via RESPIRATORY_TRACT
  Filled 2020-09-08: qty 3

## 2020-09-08 MED ORDER — MAGNESIUM SULFATE 2 GM/50ML IV SOLN
2.0000 g | Freq: Once | INTRAVENOUS | Status: AC
  Administered 2020-09-08: 2 g via INTRAVENOUS
  Filled 2020-09-08: qty 50

## 2020-09-08 MED ORDER — METHYLPREDNISOLONE SODIUM SUCC 125 MG IJ SOLR
125.0000 mg | Freq: Once | INTRAMUSCULAR | Status: AC
  Administered 2020-09-08: 125 mg via INTRAVENOUS
  Filled 2020-09-08: qty 2

## 2020-09-08 MED ORDER — ALBUTEROL SULFATE HFA 108 (90 BASE) MCG/ACT IN AERS
2.0000 | INHALATION_SPRAY | Freq: Once | RESPIRATORY_TRACT | Status: AC
  Administered 2020-09-08: 2 via RESPIRATORY_TRACT
  Filled 2020-09-08: qty 6.7

## 2020-09-08 MED ORDER — IPRATROPIUM-ALBUTEROL 0.5-2.5 (3) MG/3ML IN SOLN
3.0000 mL | Freq: Once | RESPIRATORY_TRACT | Status: AC
  Administered 2020-09-08: 3 mL via RESPIRATORY_TRACT
  Filled 2020-09-08: qty 3

## 2020-09-08 NOTE — ED Provider Notes (Signed)
[facility] EMERGENCY DEPARTMENT Provider Note   CSN: [id number] Arrival date & time: 09/08/20  2054     History Chief Complaint  Patient presents with  . Aphasia    John Black is a 86 y.o. male.  Patient presented with some slurred speech and right facial drooping.  This lasted for about 30 minutes.  Once the patient arrived in the emergency department he was no longer having slurred speech but he was short of breath from his COPD  The history is provided by the patient and a relative. No language interpreter was used.  Weakness Severity:  Moderate Onset quality:  Sudden Timing:  Intermittent Progression:  Resolved Chronicity:  New Context: not alcohol use   Relieved by:  Nothing Worsened by:  Nothing Ineffective treatments:  None tried Associated symptoms: no abdominal pain, no chest pain, no cough, no diarrhea, no frequency, no headaches and no seizures        Past Medical History:  Diagnosis Date  . Allergic rhinitis   . Cataract    s/p removal  . Chronic respiratory failure (HCC)    oxygen 3L at home  . Emphysema   . Hypothyroid   . PNA (pneumonia)   . Prostate cancer [facility])     Patient Active Problem List   Diagnosis Date Noted  . Dysphagia 07/24/2019  . Protein calorie malnutrition ([place]) 02/24/2019  . Gait instability 01/27/2019  . Right inguinal hernia   . Chronic diarrhea 12/06/2017  . Inguinal hernia 03/02/2014  . Constipation 03/02/2014  . Chronic respiratory failure with hypoxia ([place]) 12/09/2013  . Hemorrhoids 09/28/2013  . Mild hyperlipidemia 06/28/2013  . Sinusitis, chronic 11/28/2012  . COPD with acute exacerbation ([place]) 11/10/2012  . BPH (benign prostatic hyperplasia) 10/22/2012  . Insomnia 10/22/2012  . Pneumonia 09/05/2012  . Hypothyroidism 06/19/2012  . Atypical nevi 06/19/2012  . Seborrheic keratosis 06/19/2012  . History of prostate cancer 11/07/2010  . Allergic rhinitis 11/07/2010  . COPD GOLD 3/ group D  02 dep  11/07/2010    Past Surgical History:  Procedure Laterality Date  . ADENOIDECTOMY    . ROTATOR CUFF REPAIR  1990,2009   bilateral  . Seed implant for prostate cancer  2000  . TONSILLECTOMY    . TRANSURETHRAL RESECTION OF PROSTATE  2001   x2       Family History  Problem Relation Age of Onset  . Heart disease Mother   . Prostate cancer Father     Social History   Tobacco Use  . Smoking status: Former Smoker    Packs/day: 1.50    Years: 50.00    Pack years: 75.00    Types: Cigarettes    Quit date: 09/17/2008    Years since quitting: 11.9  . Smokeless tobacco: Never Used  Vaping Use  . Vaping Use: Never used  Substance Use Topics  . Alcohol use: Yes    Alcohol/week: 1.0 standard drink    Types: 1 Glasses of wine per week    Comment: each evening  . Drug use: No    Home Medications Prior to Admission medications   Medication Sig Start Date End Date Taking? Authorizing Provider  acetaminophen (TYLENOL) 325 MG tablet Take 650 mg by mouth every 6 (six) hours as needed for mild pain.    [provider]  albuterol (PROAIR HFA) 108 (90 Base) MCG/ACT inhaler 2 puffs every 4 hours as needed only  if your can't catch your breath 05/26/20   Tanda Rockers,  MD  albuterol (PROVENTIL) (2.5 MG/3ML) 0.083% nebulizer solution UE 1 VIAL IN NEBULIZER EVERY 6 HOURS AS NEEDED FOR WHEEZING/SOB 04/18/20   [place], Modena Nunnery, MD  calcium gluconate 500 MG tablet Take 1 tablet by mouth 3 (three) times daily.    [provider]  Fluticasone-Umeclidin-Vilant (TRELEGY ELLIPTA) 100-62.5-25 MCG/INH AEPB Inhale 1 puff into the lungs daily.    [provider]  Fluticasone-Umeclidin-Vilant (TRELEGY ELLIPTA) 100-62.5-25 MCG/INH AEPB Inhale 1 puff into the lungs daily. 08/29/20   Tanda Rockers, MD  guaiFENesin (MUCINEX) 600 MG 12 hr tablet Take 600 mg by mouth 2 (two) times daily.     [provider]  levothyroxine (SYNTHROID) 75 MCG tablet TAKE 1 TABLET (75 MCG TOTAL) BY  MOUTH DAILY. 04/07/20   [place], Modena Nunnery, MD  LORazepam (ATIVAN) 0.5 MG tablet TAKE 1 TABLET (0.5 MG TOTAL) BY MOUTH 2 (TWO) TIMES DAILY AS NEEDED. 05/24/20   Alycia Rossetti, MD  mirtazapine (REMERON) 15 MG tablet TAKE 1/2 TABLET BY MOUTH IN THE EVENING 07/19/20   [place], Modena Nunnery, MD  pantoprazole (PROTONIX) 40 MG tablet TAKE 1 TABLET BY MOUTH EVERY DAY 04/13/20   Magdalen Spatz, NP  predniSONE (DELTASONE) 10 MG tablet TAKE 1 TABLET (10 MG TOTAL) BY MOUTH DAILY WITH BREAKFAST. 05/27/20   Tanda Rockers, MD  Respiratory Therapy Supplies (FLUTTER) DEVI Use as directed. 08/12/19   [place], Fonnie Mu, NP  tamsulosin (FLOMAX) 0.4 MG CAPS capsule TAKE 1 CAPSULE BY MOUTH EVERY DAY 08/08/20   [place], Modena Nunnery, MD  traMADol (ULTRAM) 50 MG tablet TAKE 1 TABLET (50 MG TOTAL) BY MOUTH EVERY 8 (EIGHT) HOURS AS NEEDED. FOR PAIN 04/25/20   Alycia Rossetti, MD  zolpidem (AMBIEN) 5 MG tablet TAKE 1 TABLET BY MOUTH EVERY DAY AT BEDTIME AS NEEDED FOR SLEEP 08/08/20   Alycia Rossetti, MD    Allergies    Morphine  Review of Systems   Review of Systems  Constitutional: Negative for appetite change and fatigue.  HENT: Negative for congestion, ear discharge and sinus pressure.   Eyes: Negative for discharge.  Respiratory: Negative for cough.   Cardiovascular: Negative for chest pain.  Gastrointestinal: Negative for abdominal pain and diarrhea.  Genitourinary: Negative for frequency and hematuria.  Musculoskeletal: Negative for back pain.  Skin: Negative for rash.  Neurological: Positive for weakness. Negative for seizures and headaches.       Slurred speech  Psychiatric/Behavioral: Negative for hallucinations.    Physical Exam Updated Vital Signs BP (!) 169/100 (BP Location: Right Arm)   Pulse 96   Temp 98.2 F (36.8 C) (Oral)   Resp (!) 28   Ht 5\' 5"  (1.651 m)   Wt 56.2 kg   SpO2 97%   BMI 20.63 kg/m   Physical Exam Vitals and nursing note reviewed.  Constitutional:      Appearance: He is  well-developed.  HENT:     Head: Normocephalic.     Nose: Nose normal.  Eyes:     General: No scleral icterus.    Extraocular Movements: EOM normal.     Conjunctiva/sclera: Conjunctivae normal.  Neck:     Thyroid: No thyromegaly.  Cardiovascular:     Rate and Rhythm: Normal rate and regular rhythm.     Heart sounds: No murmur heard. No friction rub. No gallop.   Pulmonary:     Breath sounds: No stridor. Wheezing present. No rales.  Chest:     Chest wall: No tenderness.  Abdominal:  General: There is no distension.     Tenderness: There is no abdominal tenderness. There is no rebound.  Musculoskeletal:        General: No edema. Normal range of motion.     Cervical back: Neck supple.  Lymphadenopathy:     Cervical: No cervical adenopathy.  Skin:    Findings: No erythema or rash.  Neurological:     Mental Status: He is alert and oriented to person, place, and time.     Motor: No abnormal muscle tone.     Coordination: Coordination normal.  Psychiatric:        Mood and Affect: Mood and affect normal.        Behavior: Behavior normal.     ED Results / Procedures / Treatments   Labs (all labs ordered are listed, but only abnormal results are displayed) Labs Reviewed  CBC WITH DIFFERENTIAL/PLATELET - Abnormal; Notable for the following components:      Result Value   Abs Immature Granulocytes 0.15 (*)    All other components within normal limits  COMPREHENSIVE METABOLIC PANEL - Abnormal; Notable for the following components:   Glucose, Bld 114 (*)    BUN 24 (*)    All other components within normal limits  RESP PANEL BY RT-PCR (FLU A&B, COVID) [id number]  PROTIME-INR    EKG None  Radiology CT Head Wo Contrast  Result Date: 09/08/2020 CLINICAL DATA:  Stroke-like symptoms including right-sided facial droop, drooling, and aphasia. EXAM: CT HEAD WITHOUT CONTRAST TECHNIQUE: Contiguous axial images were obtained from the base of the skull through the vertex without  intravenous contrast. COMPARISON:  None. FINDINGS: Mild motion artifact is noted through the skull base. Brain: No acute large territory cortical infarct, intracranial hemorrhage, mass, midline shift, or extra-axial fluid collection is identified. Small cortical infarcts in the left frontal lobe, left parietal lobe, and right occipital lobe are favored to be chronic. Confluent hypodensities in the cerebral white matter bilaterally are nonspecific but compatible with severe chronic small vessel ischemic disease. There is mild-to-moderate cerebral atrophy. Vascular: Calcified atherosclerosis at the skull base. No hyperdense vessel. Skull: No fracture or suspicious osseous lesion. Sinuses/Orbits: Minimal mucosal thickening in the ethmoid sinuses. Clear mastoid air cells. Bilateral cataract extraction. Other: None. IMPRESSION: 1. No definite evidence of an acute intracranial abnormality. 2. Small infarcts in the left frontal, left parietal, and right occipital lobes, favored to be chronic. 3. Severe chronic small vessel ischemic disease. Electronically Signed   By: Logan Bores M.D.   On: 09/08/2020 22:04   DG Chest Port 1 View  Result Date: 09/08/2020 CLINICAL DATA:  Stroke-like symptoms, aphasia, shortness of breath EXAM: PORTABLE CHEST 1 VIEW COMPARISON:  05/27/2020 FINDINGS: 2 frontal views of the chest demonstrate a stable cardiac silhouette. Diffuse emphysema with chronic areas of scarring are seen throughout the lungs, greatest in the right upper lobe. No acute airspace disease, effusion, or pneumothorax. No acute bony abnormalities. IMPRESSION: 1. Stable emphysema and scarring.  No acute airspace disease. Electronically Signed   By: Randa Ngo M.D.   On: 09/08/2020 21:40    Procedures Procedures (including critical care time)  Medications Ordered in ED Medications  albuterol (VENTOLIN HFA) 108 (90 Base) MCG/ACT inhaler 2 puff (has no administration in time range)  magnesium sulfate IVPB 2 g 50  mL (has no administration in time range)  ipratropium-albuterol (DUONEB) 0.5-2.5 (3) MG/3ML nebulizer solution 3 mL (has no administration in time range)  albuterol (PROVENTIL) (2.5 MG/3ML) 0.083% nebulizer solution 2.5  mg (has no administration in time range)  methylPREDNISolone sodium succinate (SOLU-MEDROL) 125 mg/2 mL injection 125 mg (125 mg Intravenous Given 09/08/20 2207)  aspirin chewable tablet 81 mg (81 mg Oral Given 09/08/20 2207)  CRITICAL CARE Performed by: Milton Ferguson Total critical care time: 40 minutes Critical care time was exclusive of separately billable procedures and treating other patients. Critical care was necessary to treat or prevent imminent or life-threatening deterioration. Critical care was time spent personally by me on the following activities: development of treatment plan with patient and/or surrogate as well as nursing, discussions with consultants, evaluation of patient's response to treatment, examination of patient, obtaining history from patient or surrogate, ordering and performing treatments and interventions, ordering and review of laboratory studies, ordering and review of radiographic studies, pulse oximetry and re-evaluation of patient's condition.   ED Course  I have reviewed the triage vital signs and the nursing notes.  Pertinent labs & imaging results that were available during my care of the patient were reviewed by me and considered in my medical decision making (see chart for details). Patient was seen by telemetry neurology for his TIA.  Their recommendation was admission and work-up for the TIA and start a baby aspirin   MDM Rules/Calculators/A&P                          Patient with a TIA and COPD exacerbation he will be admitted to medicine and started on a baby aspirin Final Clinical Impression(s) / ED Diagnoses Final diagnoses:  None    Rx / DC Orders ED Discharge Orders    None       Milton Ferguson, MD 09/08/20 2215

## 2020-09-08 NOTE — Consult Note (Signed)
[facility] TeleNeurology Consult Services  Stat Consult  Date of Service:   09/08/2020 21:14:43  Diagnosis:     .  G45.9 - Transient cerebral ischemic attack, unspecified  Impression: 86 year old male history of COPD presents with transient episode of expressive aphasia, now resolved. Examination currently is nonfocal and he feels completely at his neurological baseline. Symptoms concerning for a TIA. Recommend admission for stroke work-up. Initiate aspirin. Neuro follow-up.  Our recommendations are outlined below.  Diagnostic Studies: Recommend MRI brain without contrast Routine MRA head without contrast and MRA Neck with contrast Transthoracic Echo with bubble study, if available  Laboratory Studies: Recommend Lipid panel Hemoglobin A1c  Medication: Initiate Aspirin 81 mg daily  Nursing Recommendations: Telemetry, IV Fluids, avoid dextrose containing fluids, Maintain euglycemia Neuro checks q4 hrs x 24 hrs and then per shift Head of bed 30 degrees  Consultations: Recommend Speech therapy if failed dysphagia screen Physical therapy/Occupational therapy  DVT Prophylaxis: Choice of Primary Team  Disposition: Neurology will follow  Additional Recommendations:    Metrics: [facility] Notification Time: 09/08/2020 21:13:23 Stamp Time: 09/08/2020 21:14:43 Callback Response Time: 09/08/2020 21:15:40   ----------------------------------------------------------------------------------------------------  Chief Complaint: Transient episode expressive aphasia, resolved  History of Present Illness: Patient is a 86 year old Male.  86 year old male history of COPD presents with a transient episode expressive aphasia that occurred at home. Apparently was watching TV when he stood up per his wife and then had difficulty getting his words out that lasted about 30 minutes and then resolved. Currently, patient feels completely back to his normal  baseline although his wife says he is not as verbose as he normally is. However, he has no aphasia currently. His examination is nonfocal. He is not on any antiplatelets or anticoagulation. No history of strokes or TIAs. Wife does report that patient does have a family history of strokes though.   Past Medical History:     . COPD     Examination: BP(156/82), Pulse(99), Blood Glucose(-) 1A: Level of Consciousness - Alert; keenly responsive + 0 1B: Ask Month and Age - Both Questions Right + 0 1C: Blink Eyes & Squeeze Hands - Performs Both Tasks + 0 2: Test Horizontal Extraocular Movements - Normal + 0 3: Test Visual Fields - No Visual Loss + 0 4: Test Facial Palsy (Use Grimace if Obtunded) - Normal symmetry + 0 5A: Test Left Arm Motor Drift - No Drift for 10 Seconds + 0 5B: Test Right Arm Motor Drift - No Drift for 10 Seconds + 0 6A: Test Left Leg Motor Drift - No Drift for 5 Seconds + 0 6B: Test Right Leg Motor Drift - No Drift for 5 Seconds + 0 7: Test Limb Ataxia (FNF/Heel-Shin) - No Ataxia + 0 8: Test Sensation - Normal; No sensory loss + 0 9: Test Language/Aphasia - Normal; No aphasia + 0 10: Test Dysarthria - Normal + 0 11: Test Extinction/Inattention - No abnormality + 0  NIHSS Score: 0   Patient / Family was informed the Neurology Consult would occur via TeleHealth consult by way of interactive audio and video telecommunications and consented to receiving care in this manner.  Patient is being evaluated for possible acute neurologic impairment and high probability of imminent or life - threatening deterioration.I spent total of 35 minutes providing care to this patient, including time for face to face visit via telemedicine, review of medical records, imaging studies and discussion of findings with providers, the patient and / or family.   Dr Driscilla Grammes  [facility]   [facility] [phone]  Case [id number]

## 2020-09-08 NOTE — ED Triage Notes (Signed)
Pt to the ED with stroke like symptoms including right side facial droop and drooling that began at 2010.  Pt had aphasia and pt's family states symptoms have improved but he not completely at baseline.

## 2020-09-09 ENCOUNTER — Observation Stay (HOSPITAL_COMMUNITY): Payer: PPO

## 2020-09-09 ENCOUNTER — Encounter (HOSPITAL_COMMUNITY): Payer: PPO

## 2020-09-09 ENCOUNTER — Observation Stay (HOSPITAL_BASED_OUTPATIENT_CLINIC_OR_DEPARTMENT_OTHER): Payer: PPO

## 2020-09-09 ENCOUNTER — Encounter (HOSPITAL_COMMUNITY): Payer: Self-pay | Admitting: Internal Medicine

## 2020-09-09 DIAGNOSIS — Z8546 Personal history of malignant neoplasm of prostate: Secondary | ICD-10-CM | POA: Diagnosis not present

## 2020-09-09 DIAGNOSIS — Z87891 Personal history of nicotine dependence: Secondary | ICD-10-CM | POA: Diagnosis not present

## 2020-09-09 DIAGNOSIS — Z20822 Contact with and (suspected) exposure to covid-19: Secondary | ICD-10-CM | POA: Diagnosis not present

## 2020-09-09 DIAGNOSIS — R4781 Slurred speech: Secondary | ICD-10-CM | POA: Diagnosis not present

## 2020-09-09 DIAGNOSIS — J441 Chronic obstructive pulmonary disease with (acute) exacerbation: Secondary | ICD-10-CM

## 2020-09-09 DIAGNOSIS — G459 Transient cerebral ischemic attack, unspecified: Secondary | ICD-10-CM

## 2020-09-09 DIAGNOSIS — J9611 Chronic respiratory failure with hypoxia: Secondary | ICD-10-CM

## 2020-09-09 DIAGNOSIS — N400 Enlarged prostate without lower urinary tract symptoms: Secondary | ICD-10-CM

## 2020-09-09 DIAGNOSIS — E039 Hypothyroidism, unspecified: Secondary | ICD-10-CM | POA: Diagnosis not present

## 2020-09-09 LAB — LIPID PANEL
Cholesterol: 239 mg/dL — ABNORMAL HIGH (ref 0–200)
Cholesterol: 270 mg/dL — ABNORMAL HIGH (ref 0–200)
HDL: 110 mg/dL (ref >40)
HDL: 121 mg/dL (ref >40)
LDL Cholesterol: 122 mg/dL — ABNORMAL HIGH (ref 0–99)
LDL Cholesterol: 140 mg/dL — ABNORMAL HIGH (ref 0–99)
Total CHOL/HDL Ratio: 2.2 RATIO
Total CHOL/HDL Ratio: 2.2 RATIO
Triglycerides: 33 mg/dL (ref <150)
Triglycerides: 47 mg/dL (ref <150)
VLDL: 7 mg/dL (ref 0–40)
VLDL: 9 mg/dL (ref 0–40)

## 2020-09-09 LAB — BASIC METABOLIC PANEL
Anion gap: 9 (ref 5–15)
BUN: 21 mg/dL (ref 8–23)
CO2: 30 mmol/L (ref 22–32)
Calcium: 9 mg/dL (ref 8.9–10.3)
Chloride: 101 mmol/L (ref 98–111)
Creatinine, Ser: 0.93 mg/dL (ref 0.61–1.24)
GFR, Estimated: >60 mL/min (ref >60)
Glucose, Bld: 150 mg/dL — ABNORMAL HIGH (ref 70–99)
Potassium: 4.1 mmol/L (ref 3.5–5.1)
Sodium: 140 mmol/L (ref 135–145)

## 2020-09-09 LAB — GLUCOSE, CAPILLARY: Glucose-Capillary: 137 mg/dL — ABNORMAL HIGH (ref 70–99)

## 2020-09-09 LAB — HEMOGLOBIN A1C
Hgb A1c MFr Bld: 5.9 % — ABNORMAL HIGH (ref 4.8–5.6)
Hgb A1c MFr Bld: 5.7 % — ABNORMAL HIGH (ref 4.8–5.6)
Mean Plasma Glucose: 122.63 mg/dL
Mean Plasma Glucose: 116.89 mg/dL

## 2020-09-09 LAB — ECHOCARDIOGRAM COMPLETE BUBBLE STUDY: S' Lateral: 3.3 cm

## 2020-09-09 IMAGING — MR MR HEAD W/O CM
10 of 11 series · 42 of 48 positions shown · IV contrast (gadavist)
Comparison: Head CT from yesterday

CLINICAL DATA: Slurred speech and facial droop which has resolved

EXAM:
MR HEAD WITHOUT CONTRAST
MR CIRCLE OF WILLIS WITHOUT CONTRAST
MRA OF THE NECK WITHOUT AND WITH CONTRAST
TECHNIQUE: Multiplanar, multiecho pulse sequences of the brain, circle of
willis and surrounding structures were obtained without intravenous
contrast. Angiographic images of the neck were obtained using MRA
technique without and with intravenous contrast.
CONTRAST:  5.5mL GADAVIST GADOBUTROL 1 MMOL/ML IV SOLN

[Series 5: DWI · coronal · 4.0mm · 0.88mm/px · 6 of 72 slices shown (1 of 2)]
[im 1/72]
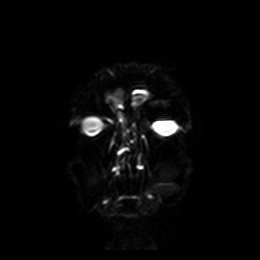
[im 15/72]
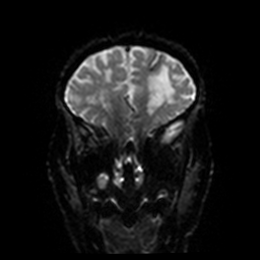
[im 29/72]
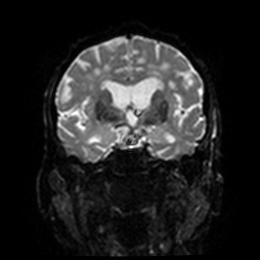
[im 43/72]
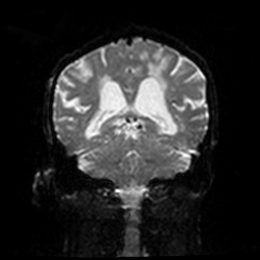
[im 57/72]
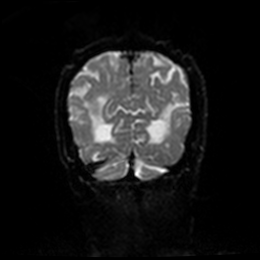
[im 72/72]
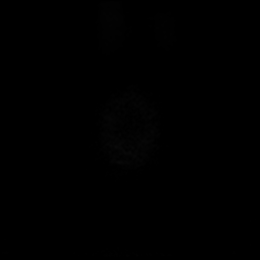

[Series 6: DWI · coronal · 4.0mm · 0.88mm/px · 3 of 36 slices shown (2 of 2)]
[im 1/36]
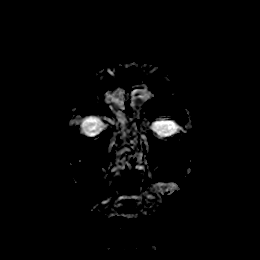
[im 18/36]
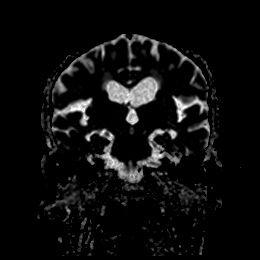
[im 36/36]
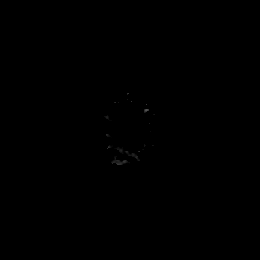

[Series 7: T1 · sagittal · 5.0mm · 0.75mm/px · 2 of 23 slices shown]
[im 1/23]
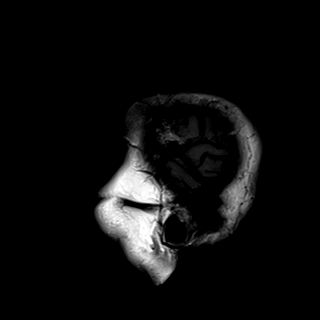
[im 23/23]
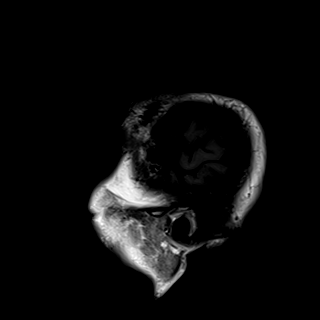

[Series 8: T2 · axial · 5.0mm · 0.72mm/px · z∈[-110,+39]mm · 3 of 26 slices shown (1 of 2)]
[im 1/26]
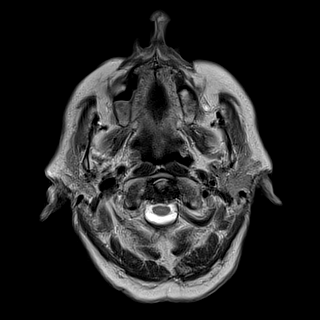
[im 13/26]
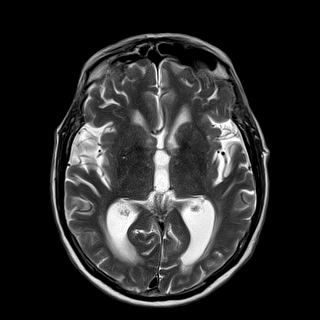
[im 26/26]
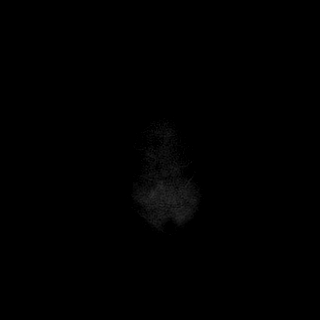

[Series 9: FLAIR · axial · 5.0mm · 0.45mm/px · z∈[-111,+38]mm · 3 of 26 slices shown]
[im 1/26]
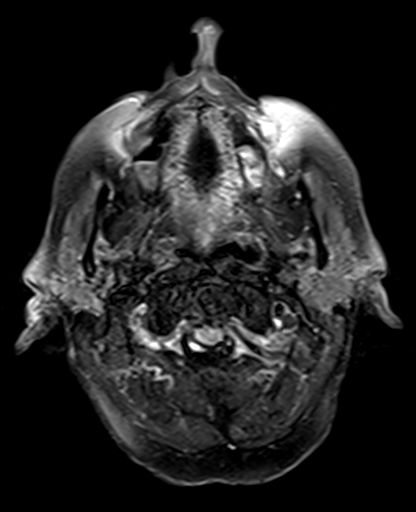
[im 13/26]
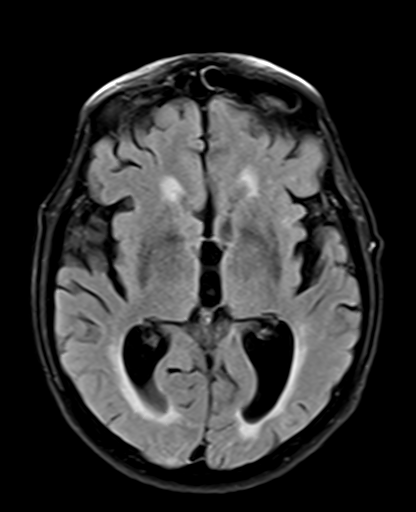
[im 26/26]
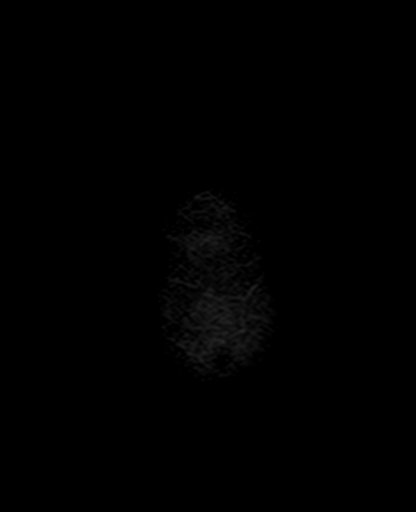

[Series 10: mag_images · axial · 3.0mm · 0.90mm/px · z∈[-125,+52]mm · 6 of 60 slices shown]
[im 1/60]
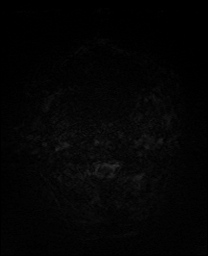
[im 12/60]
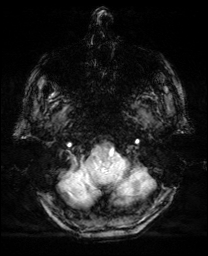
[im 24/60]
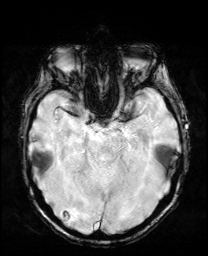
[im 36/60]
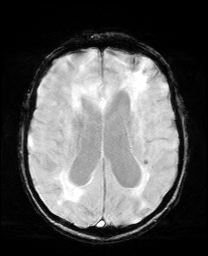
[im 48/60]
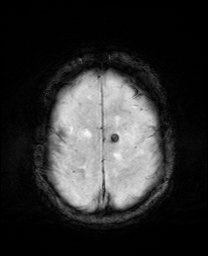
[im 60/60]
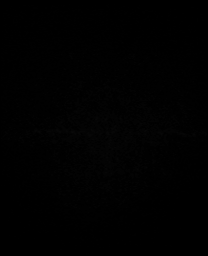

[Series 11: pha_images · axial · 3.0mm · 0.90mm/px · z∈[-122,+37]mm · 5 of 54 slices shown]
[im 1/54]
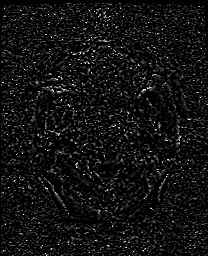
[im 14/54]
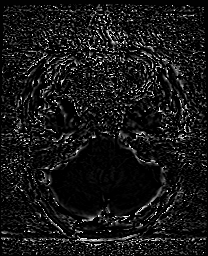
[im 27/54]
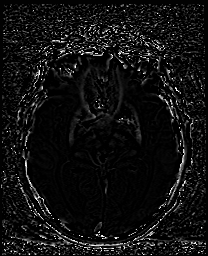
[im 40/54]
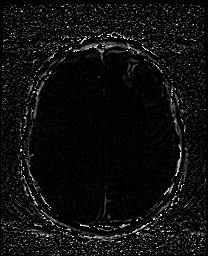
[im 54/54]
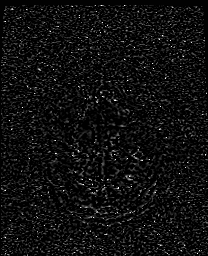

[Series 12: swi_images · axial · 3.0mm · 0.90mm/px · z∈[-125,+52]mm · 6 of 60 slices shown]
[im 1/60]
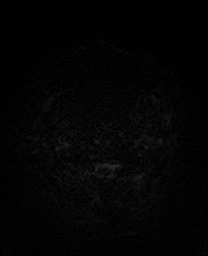
[im 12/60]
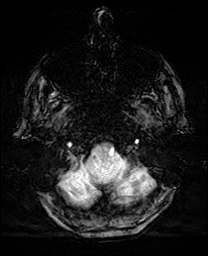
[im 24/60]
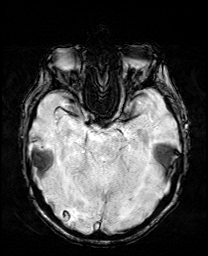
[im 36/60]
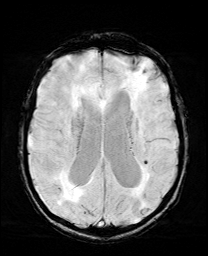
[im 48/60]
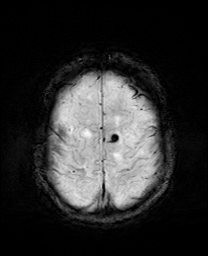
[im 60/60]
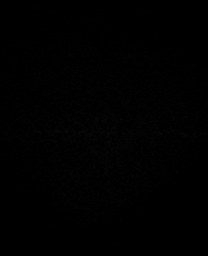

[Series 13: mip_images(sw) · axial · 24.0mm · 0.90mm/px · z∈[-114,+41]mm · 5 of 53 slices shown]
[im 1/53]
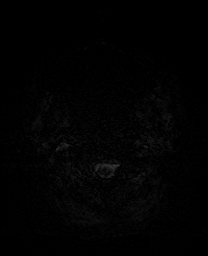
[im 14/53]
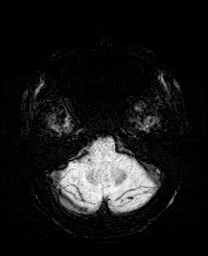
[im 27/53]
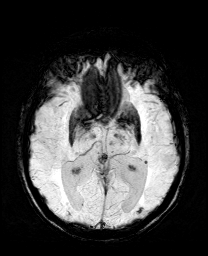
[im 40/53]
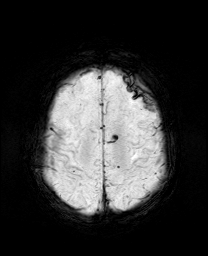
[im 53/53]
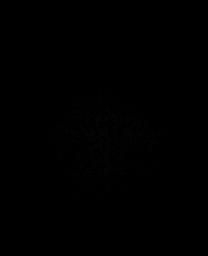

[Series 15: T2 · coronal · 5.0mm · 0.34mm/px · 3 of 29 slices shown (2 of 2)]
[im 1/29]
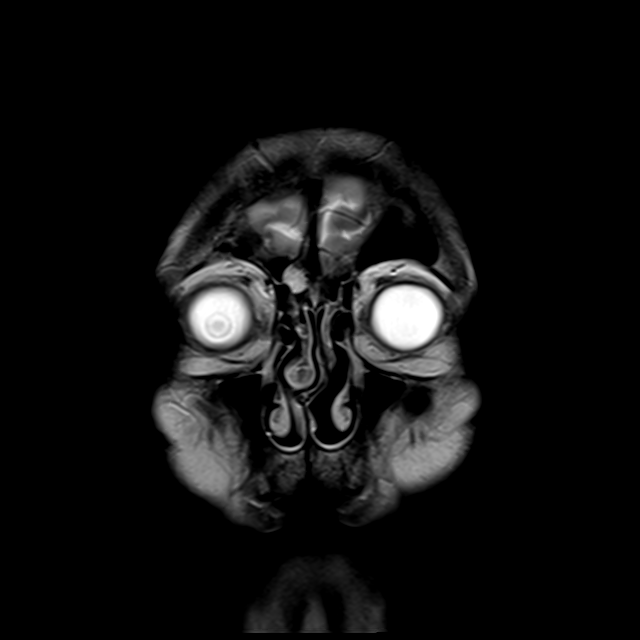
[im 15/29]
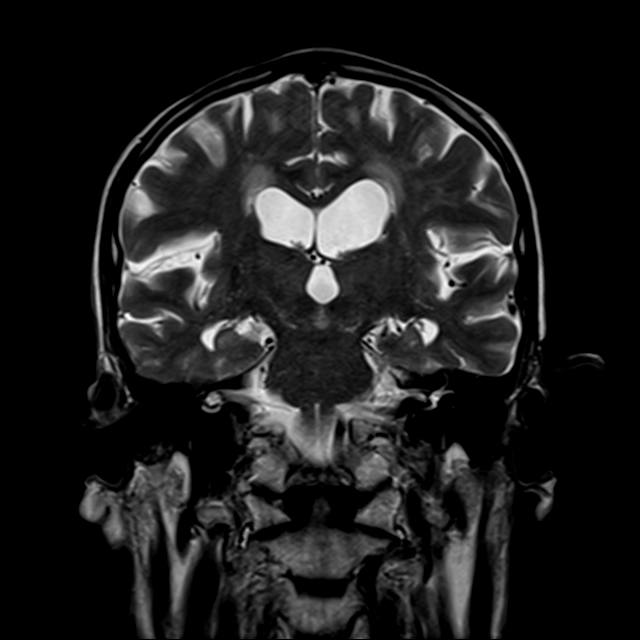
[im 29/29]
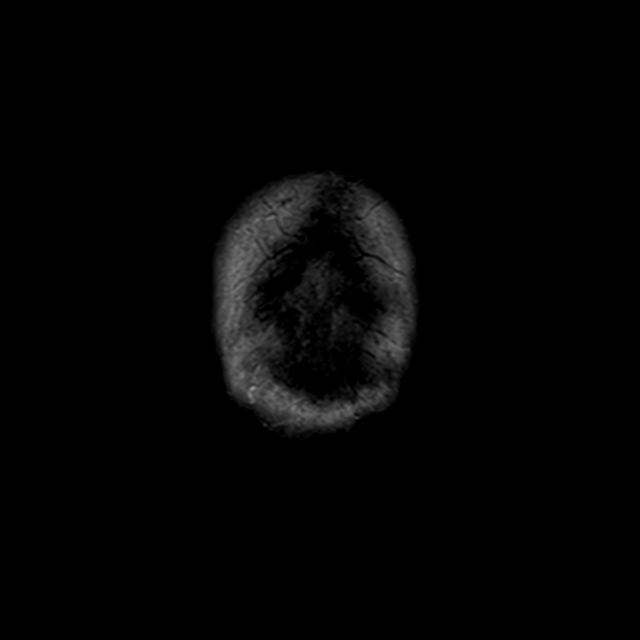

[42 of 48 positions shown; findings below may reference images not displayed]

FINDINGS: MR HEAD FINDINGS

Brain: No acute infarction, hemorrhage, hydrocephalus, extra-axial
collection or mass lesion.

Small areas of prior cortical infarction in the left parietal, right
occipital, and anterior left frontal lobes with superficial
siderosis especially in the left frontal region. Confluent FLAIR
hyperintensity in the cerebral white matter attributed to chronic
small vessel ischemia. Small remote bilateral cerebellar
infarctions.

Vascular: See below

Skull and upper cervical spine: Normal marrow signal. Retro dental
ligamentous thickening touching the cervicomedullary junction
without compression.

Sinuses/Orbits: Bilateral nasal polyp. Bilateral cataract resection.

MR CIRCLE OF WILLIS FINDINGS

Symmetric carotid arteries. Hypoplastic left A1 segment. No branch
occlusion, beading, or proximal flow limiting stenosis.

Strong dominance of the left vertebral artery with minimal right
vertebral contribution to the basilar. Branch vessels are smooth and
diffusely patent.

MRA NECK FINDINGS

Time-of-flight shows antegrade flow in the bilateral carotid and
vertebral arteries. Mask series of the neck shows no significant
incidental finding.

Normal caliber arch with 3 vessel branching. Mild atheromatous
plaque at the bilateral proximal carotid arteries without stenosis
or ulceration.

Proximal subclavian atheromatous change which is mild. The left
vertebral artery is dominant. Both vertebral arteries are smooth and
widely patent to the dura.
IMPRESSION: Brain MRI:

1. No acute finding.
2. Chronic ischemic injury including remote cortical infarcts with
superficial siderosis.

Intracranial and neck MRA:

Unremarkable for age.  No occlusion or flow limiting stenosis.

## 2020-09-09 IMAGING — MR MR HEAD W/O CM
2 series · 48 of 48 positions shown · IV contrast (gadavist)
Comparison: Head CT from yesterday

CLINICAL DATA: Slurred speech and facial droop which has resolved

EXAM:
MR HEAD WITHOUT CONTRAST
MR CIRCLE OF WILLIS WITHOUT CONTRAST
MRA OF THE NECK WITHOUT AND WITH CONTRAST
TECHNIQUE: Multiplanar, multiecho pulse sequences of the brain, circle of
willis and surrounding structures were obtained without intravenous
contrast. Angiographic images of the neck were obtained using MRA
technique without and with intravenous contrast.
CONTRAST:  5.5mL GADAVIST GADOBUTROL 1 MMOL/ML IV SOLN

[Series 5: DWI · axial · 3.0mm · 0.88mm/px · z∈[-112,+41]mm · 32 of 104 slices shown (1 of 2)]
[im 1/104]
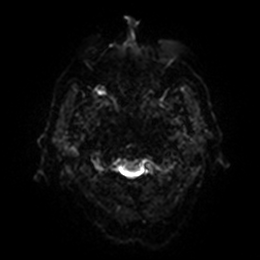
[im 4/104]
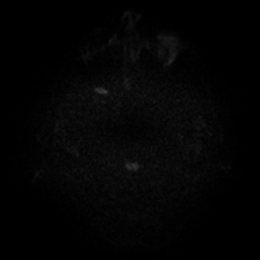
[im 7/104]
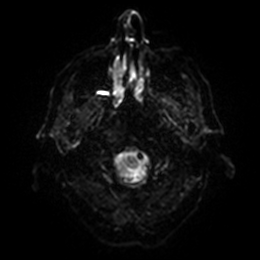
[im 10/104]
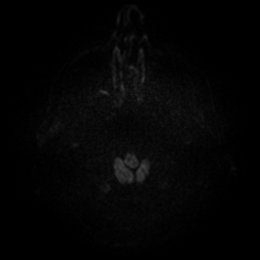
[im 14/104]
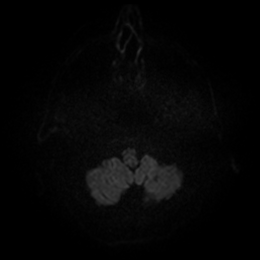
[im 17/104]
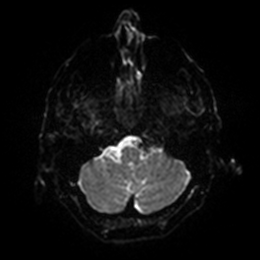
[im 20/104]
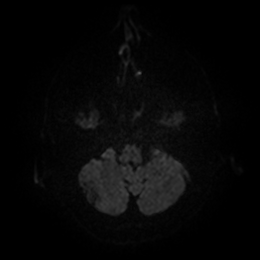
[im 24/104]
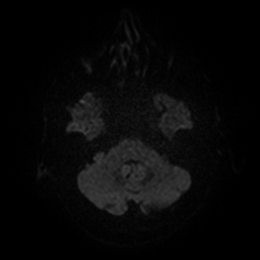
[im 27/104]
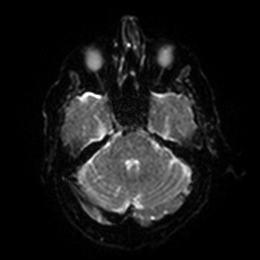
[im 30/104]
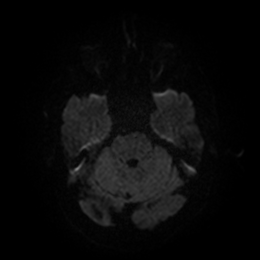
[im 34/104]
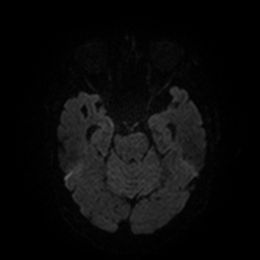
[im 37/104]
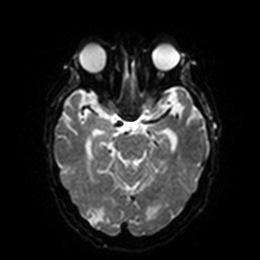
[im 40/104]
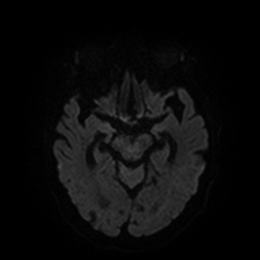
[im 44/104]
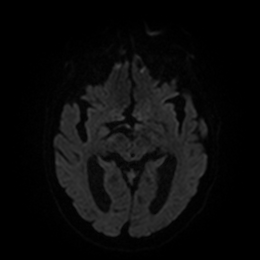
[im 47/104]
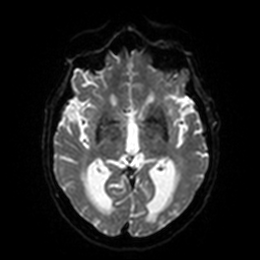
[im 50/104]
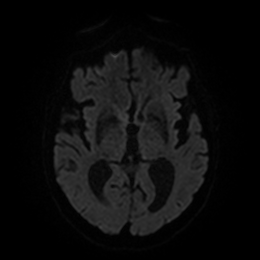
[im 54/104]
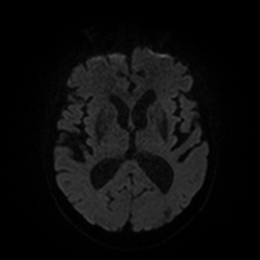
[im 57/104]
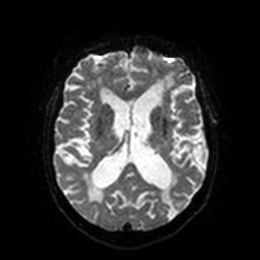
[im 60/104]
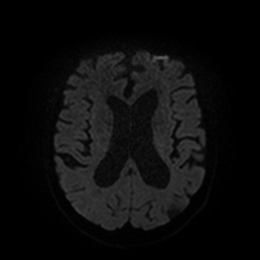
[im 64/104]
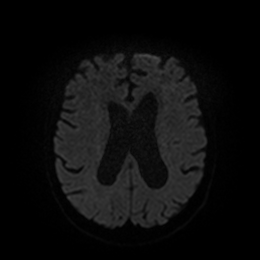
[im 67/104]
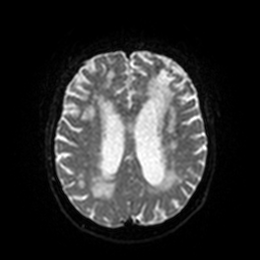
[im 70/104]
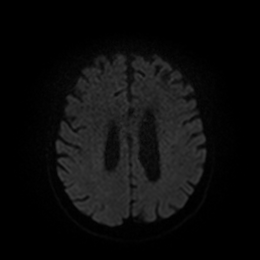
[im 74/104]
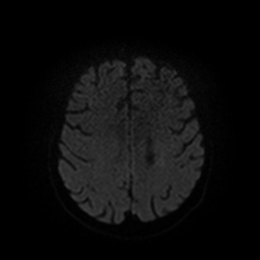
[im 77/104]
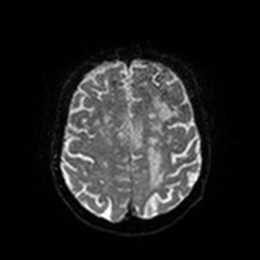
[im 80/104]
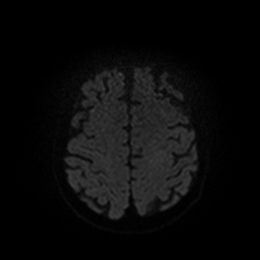
[im 84/104]
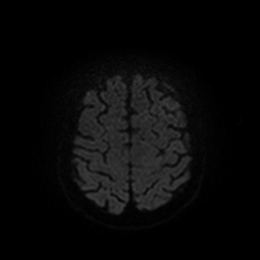
[im 87/104]
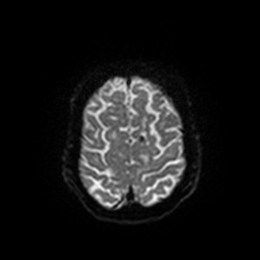
[im 90/104]
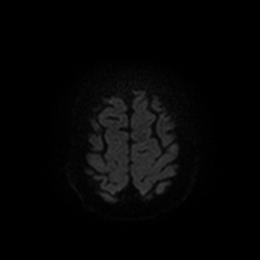
[im 94/104]
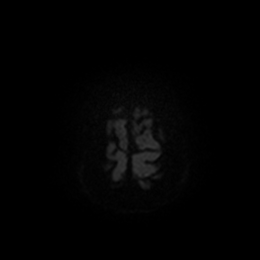
[im 97/104]
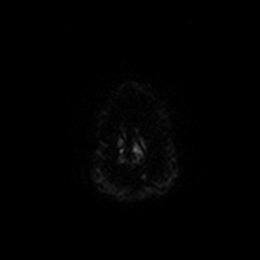
[im 100/104]
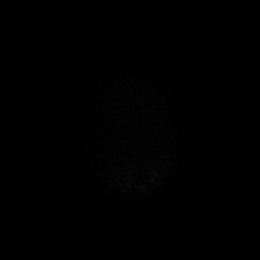
[im 104/104]
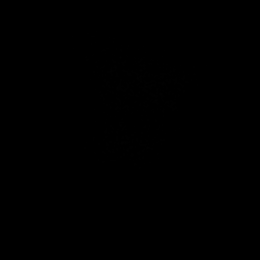

[Series 6: DWI · axial · 3.0mm · 0.88mm/px · z∈[-112,+38]mm · 16 of 51 slices shown (2 of 2)]
[im 1/51]
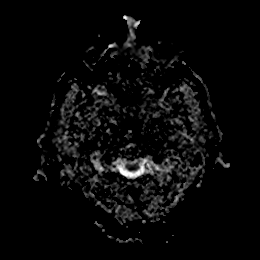
[im 4/51]
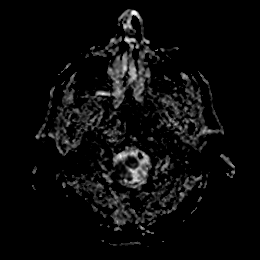
[im 7/51]
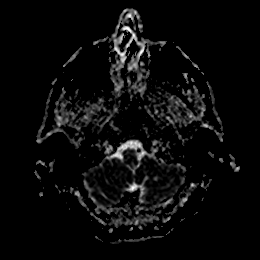
[im 11/51]
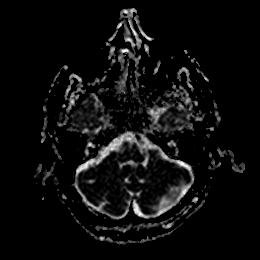
[im 14/51]
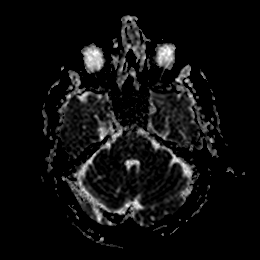
[im 17/51]
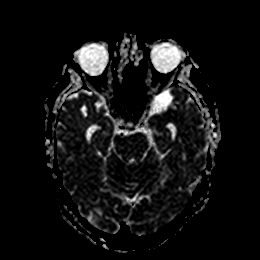
[im 21/51]
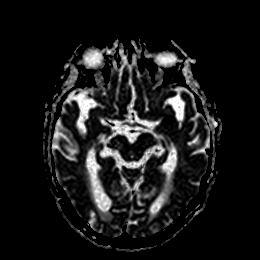
[im 24/51]
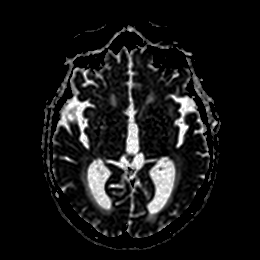
[im 27/51]
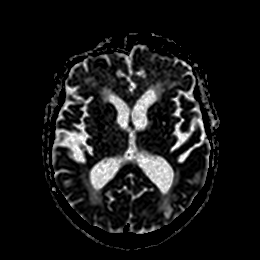
[im 31/51]
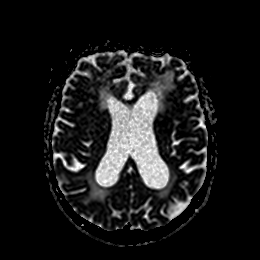
[im 34/51]
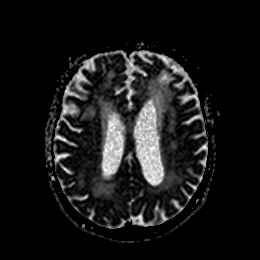
[im 37/51]
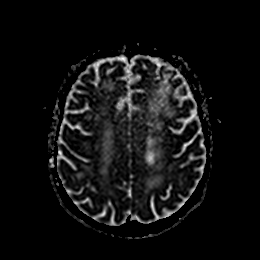
[im 41/51]
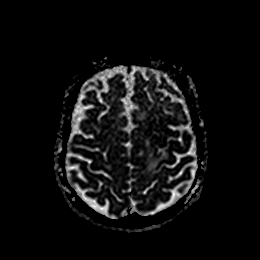
[im 44/51]
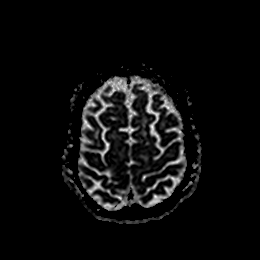
[im 47/51]
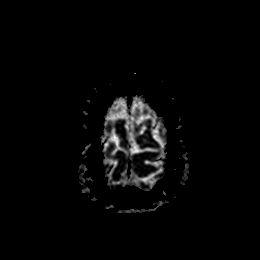
[im 51/51]
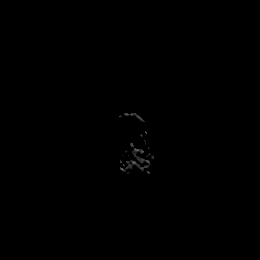

[48 of 48 positions shown; findings below may reference images not displayed]

FINDINGS: MR HEAD FINDINGS

Brain: No acute infarction, hemorrhage, hydrocephalus, extra-axial
collection or mass lesion.

Small areas of prior cortical infarction in the left parietal, right
occipital, and anterior left frontal lobes with superficial
siderosis especially in the left frontal region. Confluent FLAIR
hyperintensity in the cerebral white matter attributed to chronic
small vessel ischemia. Small remote bilateral cerebellar
infarctions.

Vascular: See below

Skull and upper cervical spine: Normal marrow signal. Retro dental
ligamentous thickening touching the cervicomedullary junction
without compression.

Sinuses/Orbits: Bilateral nasal polyp. Bilateral cataract resection.

MR CIRCLE OF WILLIS FINDINGS

Symmetric carotid arteries. Hypoplastic left A1 segment. No branch
occlusion, beading, or proximal flow limiting stenosis.

Strong dominance of the left vertebral artery with minimal right
vertebral contribution to the basilar. Branch vessels are smooth and
diffusely patent.

MRA NECK FINDINGS

Time-of-flight shows antegrade flow in the bilateral carotid and
vertebral arteries. Mask series of the neck shows no significant
incidental finding.

Normal caliber arch with 3 vessel branching. Mild atheromatous
plaque at the bilateral proximal carotid arteries without stenosis
or ulceration.

Proximal subclavian atheromatous change which is mild. The left
vertebral artery is dominant. Both vertebral arteries are smooth and
widely patent to the dura.
IMPRESSION: Brain MRI:

1. No acute finding.
2. Chronic ischemic injury including remote cortical infarcts with
superficial siderosis.

Intracranial and neck MRA:

Unremarkable for age.  No occlusion or flow limiting stenosis.

## 2020-09-09 IMAGING — MR MR MRA NECK WO/W CM
8 of 12 series · 42 of 48 positions shown · IV contrast (Contrast agent)
Comparison: Head CT from yesterday

CLINICAL DATA: Slurred speech and facial droop which has resolved

EXAM:
MR HEAD WITHOUT CONTRAST
MR CIRCLE OF WILLIS WITHOUT CONTRAST
MRA OF THE NECK WITHOUT AND WITH CONTRAST
TECHNIQUE: Multiplanar, multiecho pulse sequences of the brain, circle of
willis and surrounding structures were obtained without intravenous
contrast. Angiographic images of the neck were obtained using MRA
technique without and with intravenous contrast.
CONTRAST:  5.5mL GADAVIST GADOBUTROL 1 MMOL/ML IV SOLN

[Series 11: tof_fl3d_tra_iso · axial · 0.6mm · 0.52mm/px · z∈[-207,-129]mm · 7 of 133 slices shown]
[im 1/133]
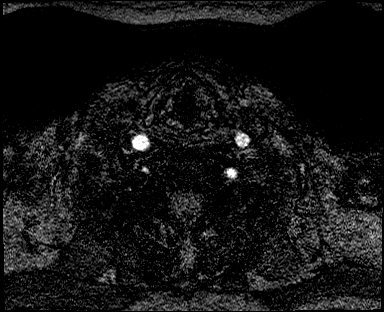
[im 23/133]
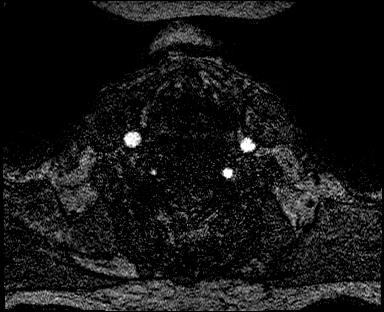
[im 45/133]
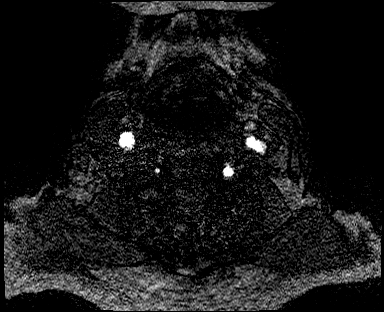
[im 67/133]
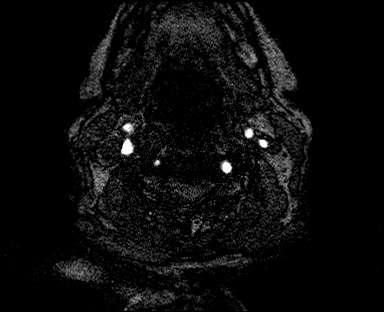
[im 89/133]
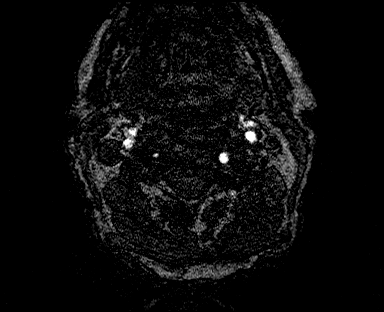
[im 111/133]
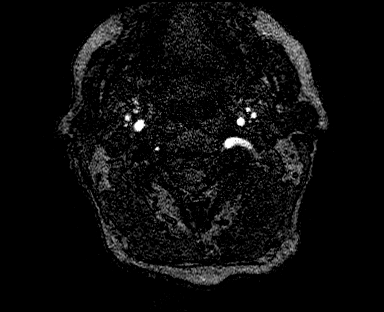
[im 133/133]
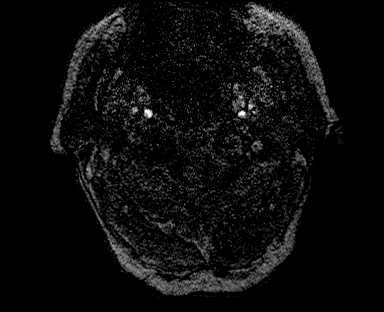

[Series 14: angio_fl3d_cor_pre_ttc=3.0s · coronal · 0.9mm · 0.85mm/px · 5 of 96 slices shown]
[im 1/96]
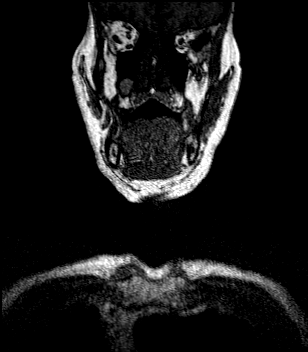
[im 24/96]
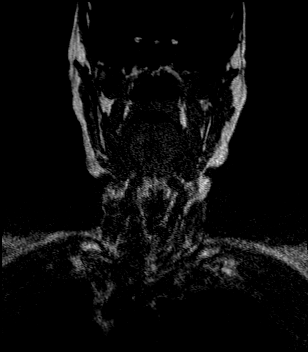
[im 48/96]
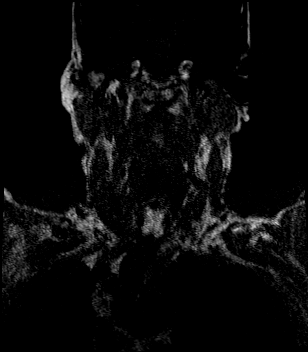
[im 72/96]
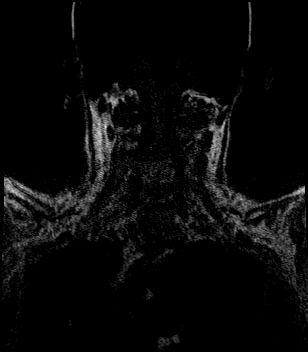
[im 96/96]
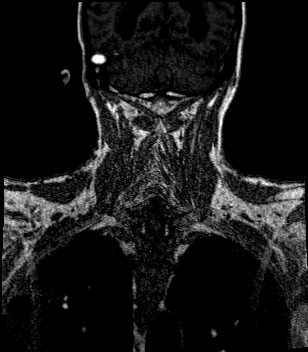

[Series 16: angio_fl3d_cor_post_ttc=3.0s · coronal · 0.9mm · 0.85mm/px · 5 of 96 slices shown (1 of 2)]
[im 1/96]
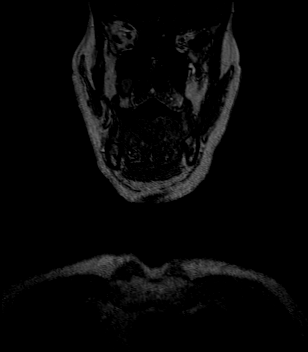
[im 24/96]
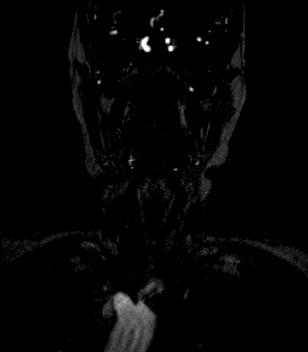
[im 48/96]
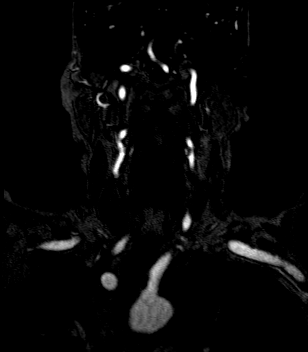
[im 72/96]
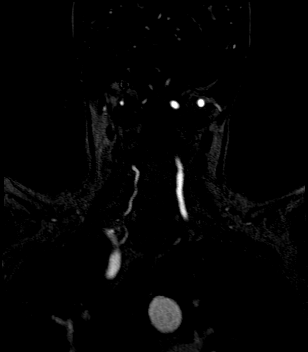
[im 96/96]
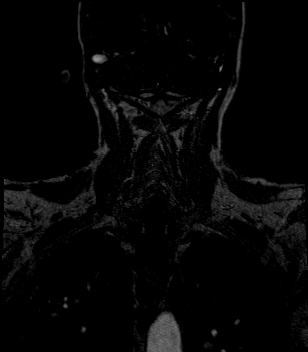

[Series 17: angio_fl3d_cor_post_ttc=3.0s_moco-adv · coronal · 0.9mm · 0.85mm/px · 5 of 96 slices shown (1 of 2)]
[im 1/96]
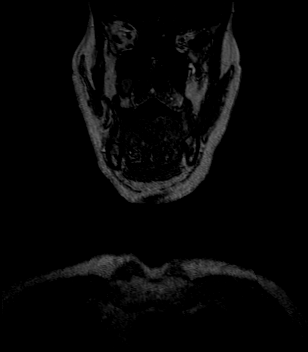
[im 24/96]
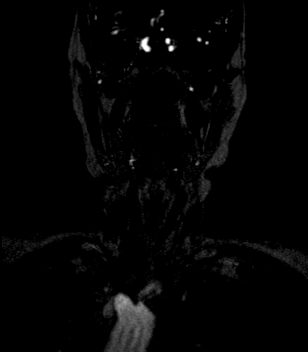
[im 48/96]
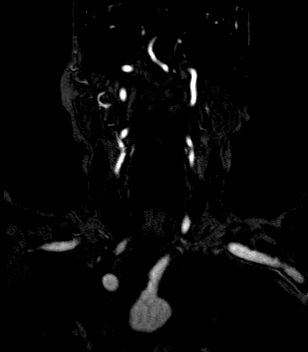
[im 72/96]
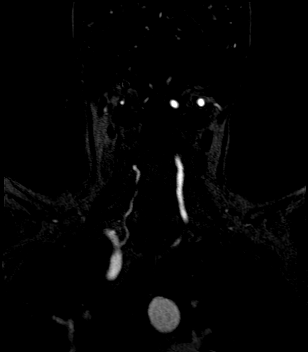
[im 96/96]
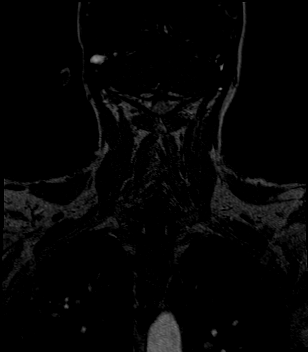

[Series 18: angio_fl3d_cor_post_ttc=3.0s_moco-adv_sub · coronal · 0.9mm · 0.85mm/px · 5 of 96 slices shown (1 of 2)]
[im 1/96]
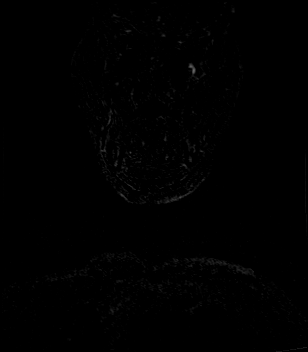
[im 24/96]
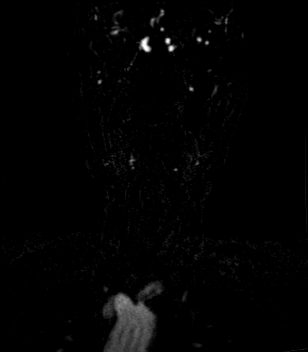
[im 48/96]
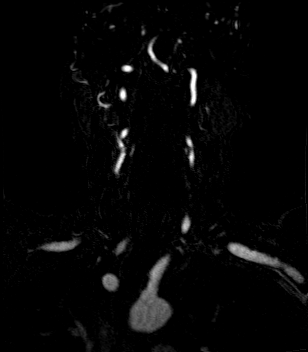
[im 72/96]
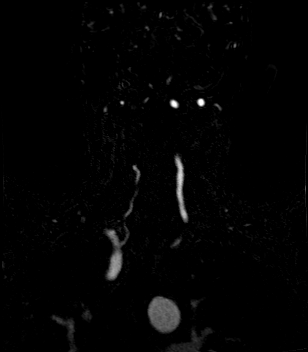
[im 96/96]
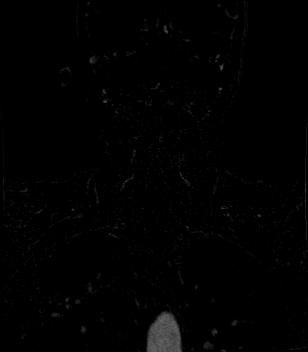

[Series 20: angio_fl3d_cor_post_ttc=3.0s · coronal · 0.9mm · 0.85mm/px · 5 of 96 slices shown (2 of 2)]
[im 1/96]
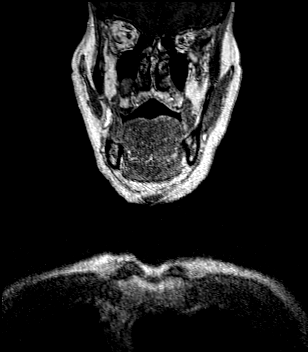
[im 24/96]
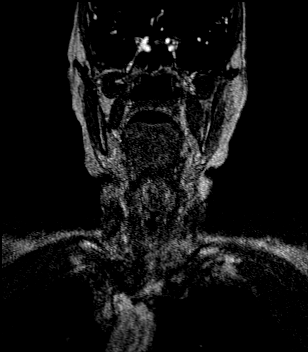
[im 48/96]
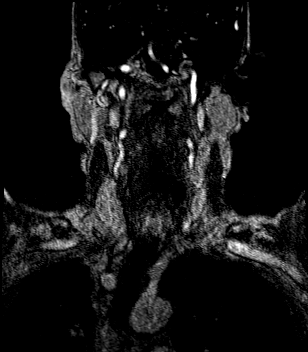
[im 72/96]
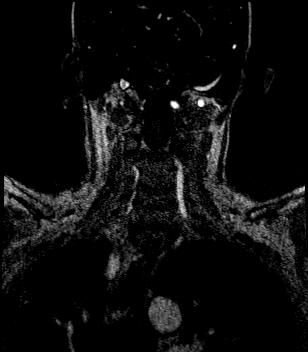
[im 96/96]
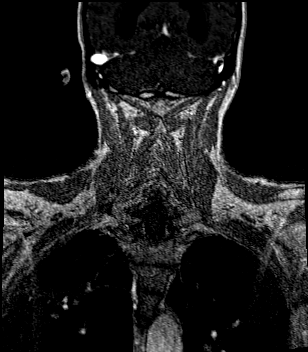

[Series 21: angio_fl3d_cor_post_ttc=3.0s_moco-adv · coronal · 0.9mm · 0.85mm/px · 5 of 96 slices shown (2 of 2)]
[im 1/96]
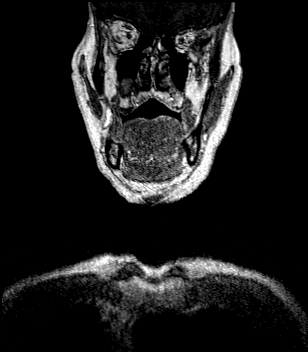
[im 24/96]
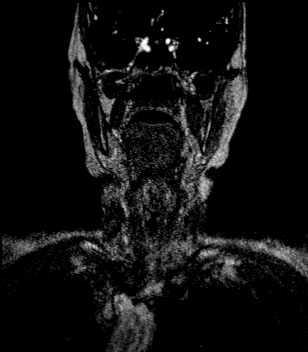
[im 48/96]
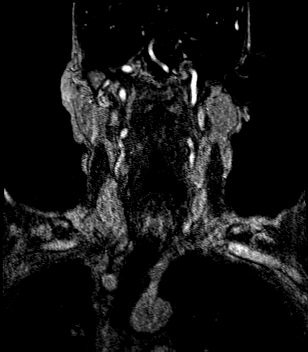
[im 72/96]
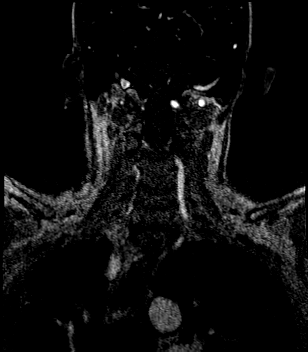
[im 96/96]
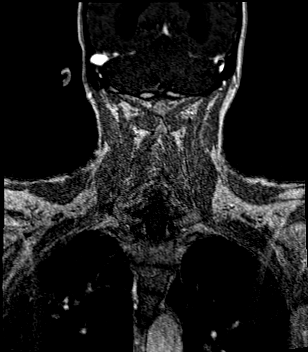

[Series 22: angio_fl3d_cor_post_ttc=3.0s_moco-adv_sub · coronal · 0.9mm · 0.85mm/px · 5 of 96 slices shown (2 of 2)]
[im 1/96]
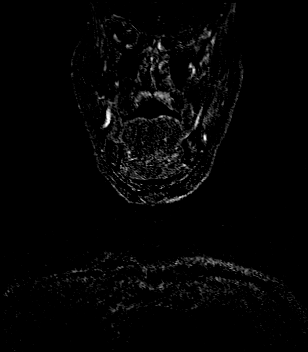
[im 24/96]
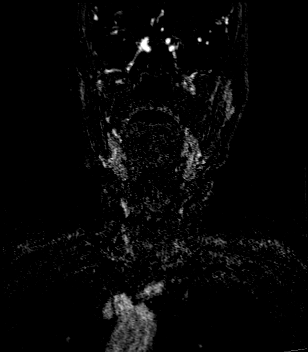
[im 48/96]
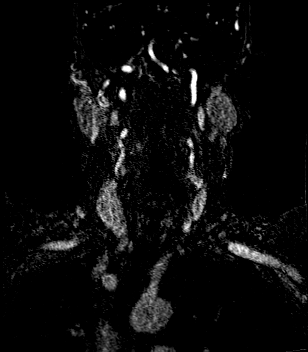
[im 72/96]
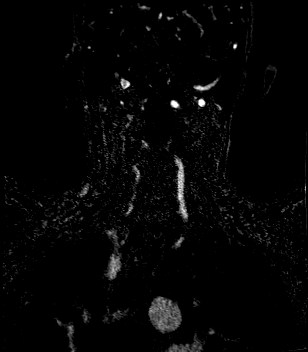
[im 96/96]
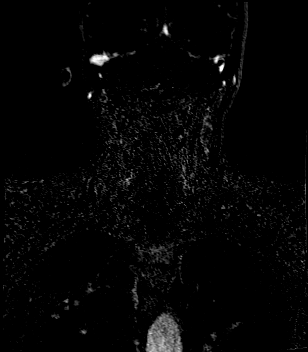

[42 of 48 positions shown; findings below may reference images not displayed]

FINDINGS: MR HEAD FINDINGS

Brain: No acute infarction, hemorrhage, hydrocephalus, extra-axial
collection or mass lesion.

Small areas of prior cortical infarction in the left parietal, right
occipital, and anterior left frontal lobes with superficial
siderosis especially in the left frontal region. Confluent FLAIR
hyperintensity in the cerebral white matter attributed to chronic
small vessel ischemia. Small remote bilateral cerebellar
infarctions.

Vascular: See below

Skull and upper cervical spine: Normal marrow signal. Retro dental
ligamentous thickening touching the cervicomedullary junction
without compression.

Sinuses/Orbits: Bilateral nasal polyp. Bilateral cataract resection.

MR CIRCLE OF WILLIS FINDINGS

Symmetric carotid arteries. Hypoplastic left A1 segment. No branch
occlusion, beading, or proximal flow limiting stenosis.

Strong dominance of the left vertebral artery with minimal right
vertebral contribution to the basilar. Branch vessels are smooth and
diffusely patent.

MRA NECK FINDINGS

Time-of-flight shows antegrade flow in the bilateral carotid and
vertebral arteries. Mask series of the neck shows no significant
incidental finding.

Normal caliber arch with 3 vessel branching. Mild atheromatous
plaque at the bilateral proximal carotid arteries without stenosis
or ulceration.

Proximal subclavian atheromatous change which is mild. The left
vertebral artery is dominant. Both vertebral arteries are smooth and
widely patent to the dura.
IMPRESSION: Brain MRI:

1. No acute finding.
2. Chronic ischemic injury including remote cortical infarcts with
superficial siderosis.

Intracranial and neck MRA:

Unremarkable for age.  No occlusion or flow limiting stenosis.

## 2020-09-09 IMAGING — MR MR MRA HEAD W/O CM
1 series · 19 of 48 positions shown · IV contrast (gadavist)
Comparison: Head CT from yesterday

CLINICAL DATA: Slurred speech and facial droop which has resolved

EXAM:
MR HEAD WITHOUT CONTRAST
MR CIRCLE OF WILLIS WITHOUT CONTRAST
MRA OF THE NECK WITHOUT AND WITH CONTRAST
TECHNIQUE: Multiplanar, multiecho pulse sequences of the brain, circle of
willis and surrounding structures were obtained without intravenous
contrast. Angiographic images of the neck were obtained using MRA
technique without and with intravenous contrast.
CONTRAST:  5.5mL GADAVIST GADOBUTROL 1 MMOL/ML IV SOLN

[Series 7: 3d cow · axial · 0.5mm · 0.41mm/px · z∈[-103,-21]mm · 19 of 172 slices shown]
[im 1/172]
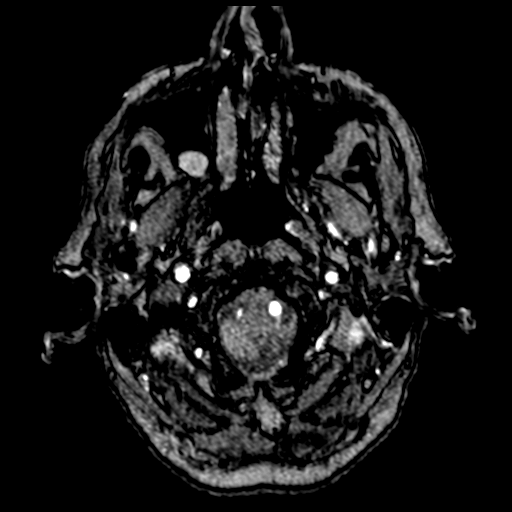
[im 4/172]
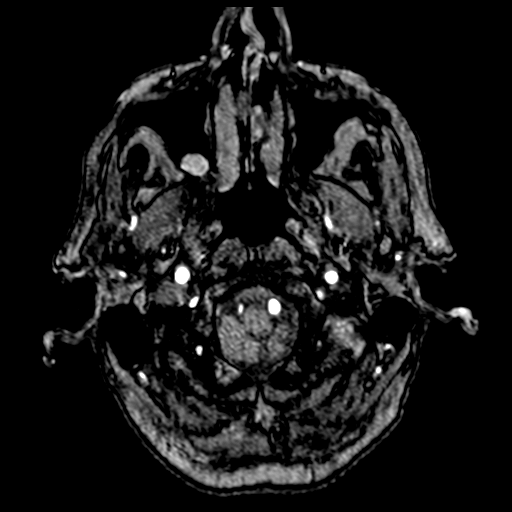
[im 8/172]
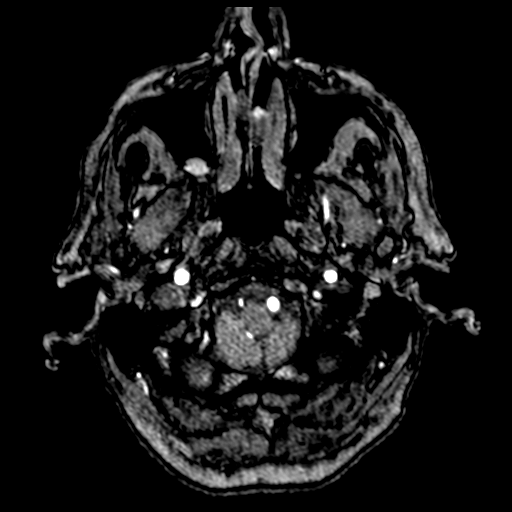
[im 11/172]
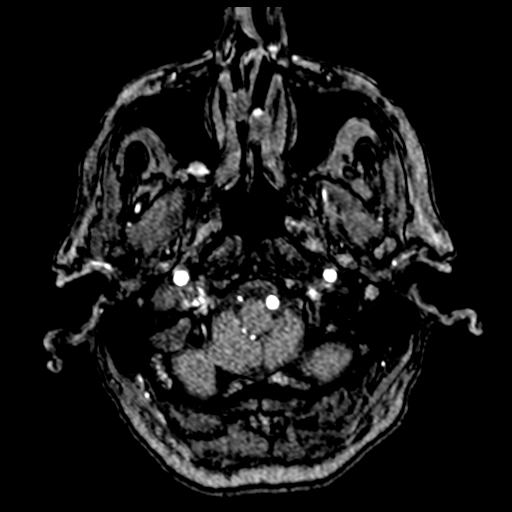
[im 15/172]
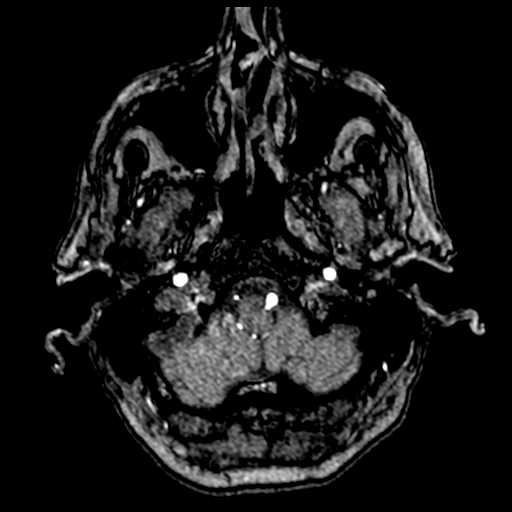
[im 19/172]
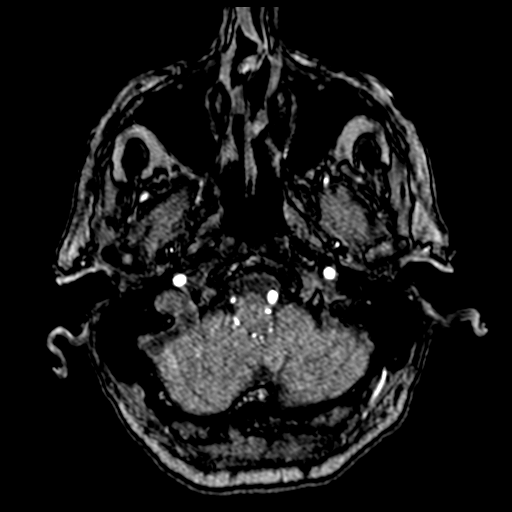
[im 22/172]
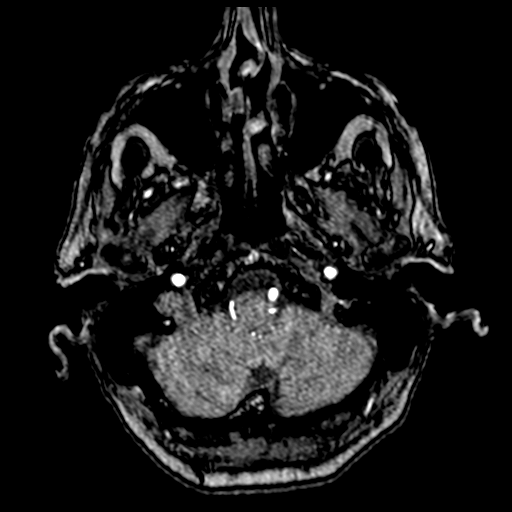
[im 26/172]
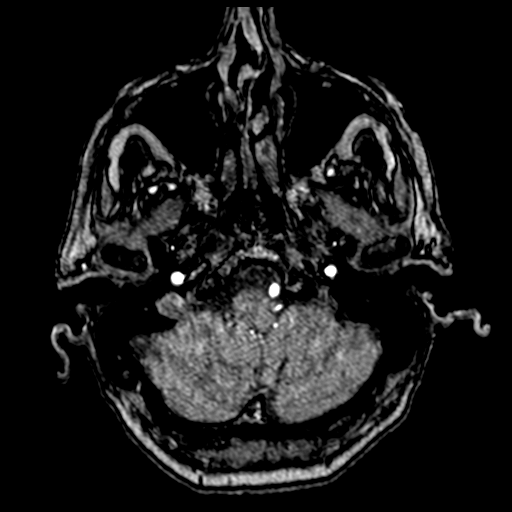
[im 30/172]
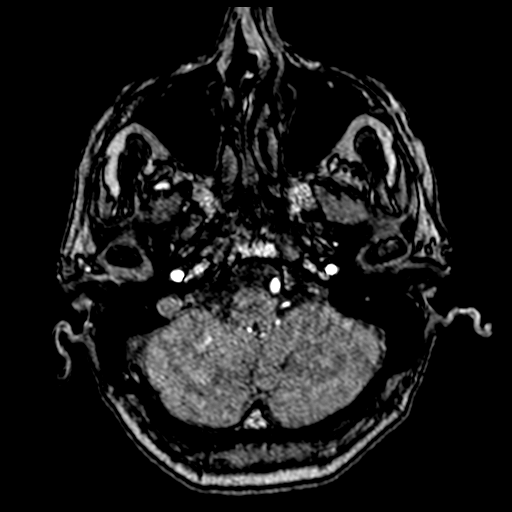
[im 33/172]
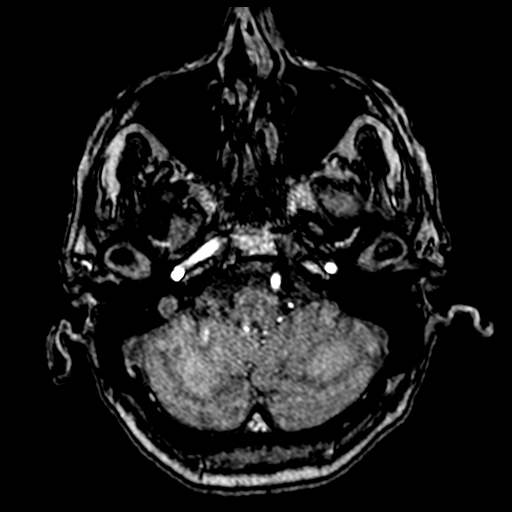
[im 37/172]
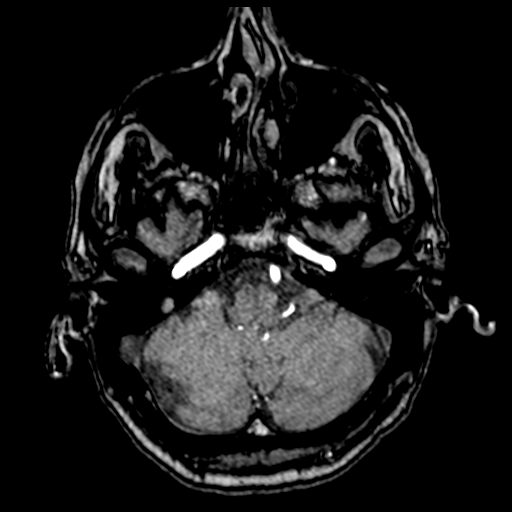
[im 55/172]
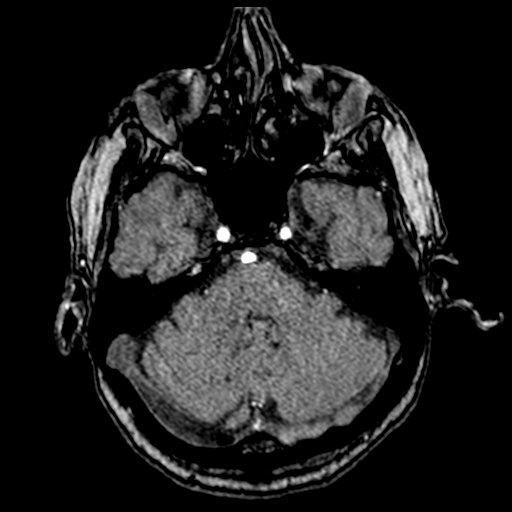
[im 77/172]
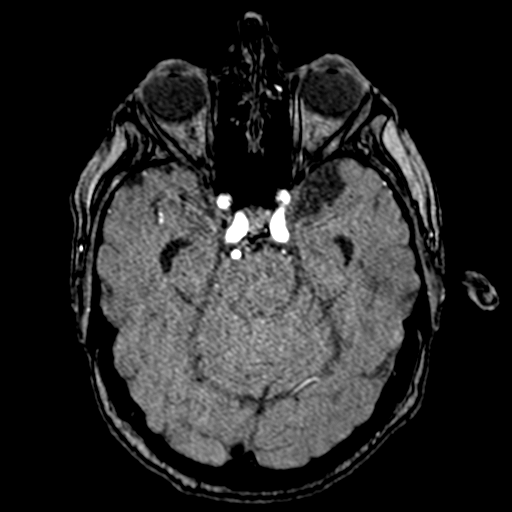
[im 88/172]
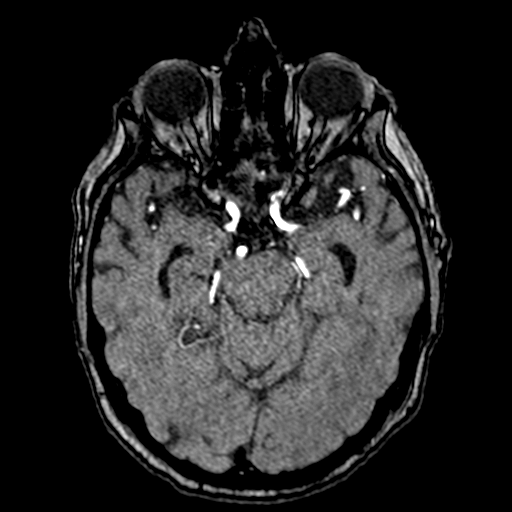
[im 99/172]
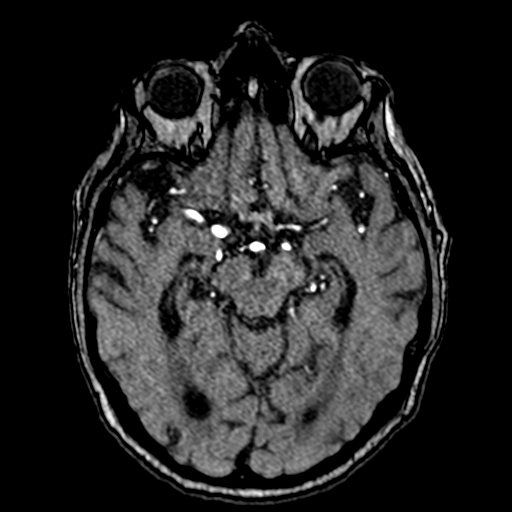
[im 121/172]
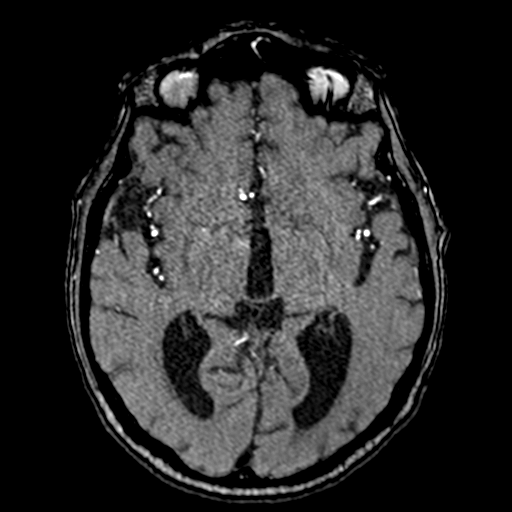
[im 142/172]
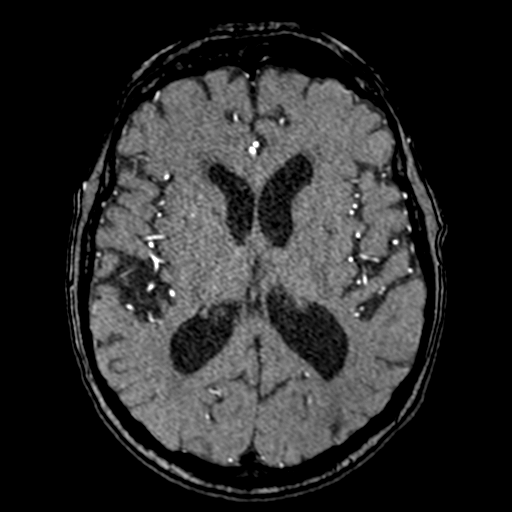
[im 146/172]
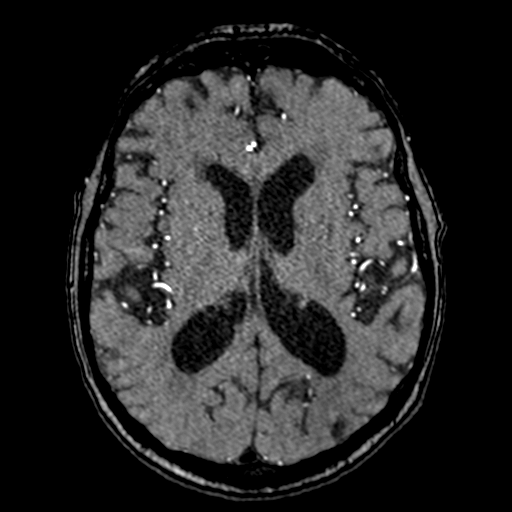
[im 164/172]
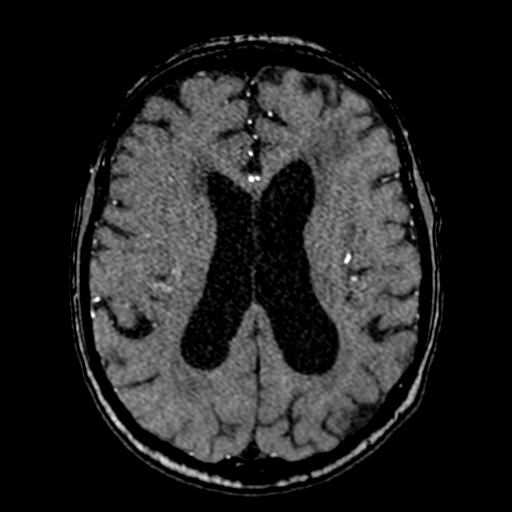

[19 of 48 positions shown; findings below may reference images not displayed]

FINDINGS: MR HEAD FINDINGS

Brain: No acute infarction, hemorrhage, hydrocephalus, extra-axial
collection or mass lesion.

Small areas of prior cortical infarction in the left parietal, right
occipital, and anterior left frontal lobes with superficial
siderosis especially in the left frontal region. Confluent FLAIR
hyperintensity in the cerebral white matter attributed to chronic
small vessel ischemia. Small remote bilateral cerebellar
infarctions.

Vascular: See below

Skull and upper cervical spine: Normal marrow signal. Retro dental
ligamentous thickening touching the cervicomedullary junction
without compression.

Sinuses/Orbits: Bilateral nasal polyp. Bilateral cataract resection.

MR CIRCLE OF WILLIS FINDINGS

Symmetric carotid arteries. Hypoplastic left A1 segment. No branch
occlusion, beading, or proximal flow limiting stenosis.

Strong dominance of the left vertebral artery with minimal right
vertebral contribution to the basilar. Branch vessels are smooth and
diffusely patent.

MRA NECK FINDINGS

Time-of-flight shows antegrade flow in the bilateral carotid and
vertebral arteries. Mask series of the neck shows no significant
incidental finding.

Normal caliber arch with 3 vessel branching. Mild atheromatous
plaque at the bilateral proximal carotid arteries without stenosis
or ulceration.

Proximal subclavian atheromatous change which is mild. The left
vertebral artery is dominant. Both vertebral arteries are smooth and
widely patent to the dura.
IMPRESSION: Brain MRI:

1. No acute finding.
2. Chronic ischemic injury including remote cortical infarcts with
superficial siderosis.

Intracranial and neck MRA:

Unremarkable for age.  No occlusion or flow limiting stenosis.

## 2020-09-09 MED ORDER — ALBUTEROL SULFATE HFA 108 (90 BASE) MCG/ACT IN AERS: 2.0000 | INHALATION_SPRAY | RESPIRATORY_TRACT | Status: DC | PRN

## 2020-09-09 MED ORDER — PREDNISONE 5 MG PO TABS: 10.0000 mg | ORAL_TABLET | Freq: Every day | ORAL | Status: DC

## 2020-09-09 MED ORDER — TRAMADOL HCL 50 MG PO TABS: 50.0000 mg | ORAL_TABLET | Freq: Three times a day (TID) | ORAL | Status: DC | PRN

## 2020-09-09 MED ORDER — FLUTICASONE-UMECLIDIN-VILANT 100-62.5-25 MCG/INH IN AEPB: 1.0000 | INHALATION_SPRAY | Freq: Every day | RESPIRATORY_TRACT | Status: DC

## 2020-09-09 MED ORDER — PREDNISONE 10 MG PO TABS: 10.0000 mg | ORAL_TABLET | Freq: Every day | ORAL | Status: DC

## 2020-09-09 MED ORDER — METHYLPREDNISOLONE SODIUM SUCC 125 MG IJ SOLR
60.0000 mg | Freq: Two times a day (BID) | INTRAMUSCULAR | Status: DC
  Administered 2020-09-09: 60 mg via INTRAVENOUS
  Filled 2020-09-09: qty 2

## 2020-09-09 MED ORDER — LEVOTHYROXINE SODIUM 75 MCG PO TABS
75.0000 ug | ORAL_TABLET | Freq: Every day | ORAL | Status: DC
  Administered 2020-09-09: 75 ug via ORAL
  Filled 2020-09-09: qty 1

## 2020-09-09 MED ORDER — ENOXAPARIN SODIUM 40 MG/0.4ML ~~LOC~~ SOLN
40.0000 mg | SUBCUTANEOUS | Status: DC
  Administered 2020-09-09: 40 mg via SUBCUTANEOUS
  Filled 2020-09-09: qty 0.4

## 2020-09-09 MED ORDER — ARFORMOTEROL TARTRATE 15 MCG/2ML IN NEBU
15.0000 ug | INHALATION_SOLUTION | Freq: Two times a day (BID) | RESPIRATORY_TRACT | Status: DC
  Administered 2020-09-09: 15 ug via RESPIRATORY_TRACT
  Filled 2020-09-09: qty 2

## 2020-09-09 MED ORDER — MIRTAZAPINE 15 MG PO TABS: 7.5000 mg | ORAL_TABLET | Freq: Every day | ORAL | Status: DC

## 2020-09-09 MED ORDER — INSULIN ASPART 100 UNIT/ML ~~LOC~~ SOLN
0.0000 [IU] | Freq: Three times a day (TID) | SUBCUTANEOUS | Status: DC
  Administered 2020-09-09: 3 [IU] via SUBCUTANEOUS

## 2020-09-09 MED ORDER — ACETAMINOPHEN 650 MG RE SUPP: 650.0000 mg | Freq: Four times a day (QID) | RECTAL | Status: DC | PRN

## 2020-09-09 MED ORDER — ASPIRIN EC 81 MG PO TBEC
81.0000 mg | DELAYED_RELEASE_TABLET | Freq: Every day | ORAL | Status: DC
  Administered 2020-09-09: 81 mg via ORAL
  Filled 2020-09-09: qty 1

## 2020-09-09 MED ORDER — FLUTICASONE FUROATE-VILANTEROL 100-25 MCG/INH IN AEPB
1.0000 | INHALATION_SPRAY | Freq: Every day | RESPIRATORY_TRACT | Status: DC
  Filled 2020-09-09: qty 28

## 2020-09-09 MED ORDER — LORAZEPAM 0.5 MG PO TABS: 0.5000 mg | ORAL_TABLET | Freq: Two times a day (BID) | ORAL | Status: DC | PRN

## 2020-09-09 MED ORDER — GADOBUTROL 1 MMOL/ML IV SOLN
5.5000 mL | Freq: Once | INTRAVENOUS | Status: AC | PRN
  Administered 2020-09-09: 5.5 mL via INTRAVENOUS

## 2020-09-09 MED ORDER — ZOLPIDEM TARTRATE 5 MG PO TABS: 5.0000 mg | ORAL_TABLET | Freq: Every evening | ORAL | Status: DC | PRN

## 2020-09-09 MED ORDER — EZETIMIBE 10 MG PO TABS: 10.0000 mg | ORAL_TABLET | Freq: Every day | ORAL | 11 refills | Status: DC

## 2020-09-09 MED ORDER — STROKE: EARLY STAGES OF RECOVERY BOOK
Freq: Once | Status: AC
  Filled 2020-09-09 (×2): qty 1

## 2020-09-09 MED ORDER — PANTOPRAZOLE SODIUM 40 MG PO TBEC
40.0000 mg | DELAYED_RELEASE_TABLET | Freq: Every day | ORAL | Status: DC
  Administered 2020-09-09: 40 mg via ORAL
  Filled 2020-09-09: qty 1

## 2020-09-09 MED ORDER — ONDANSETRON HCL 4 MG PO TABS: 4.0000 mg | ORAL_TABLET | Freq: Four times a day (QID) | ORAL | Status: DC | PRN

## 2020-09-09 MED ORDER — IPRATROPIUM BROMIDE 0.02 % IN SOLN
0.5000 mg | Freq: Four times a day (QID) | RESPIRATORY_TRACT | Status: DC
  Administered 2020-09-09: 0.5 mg via RESPIRATORY_TRACT
  Filled 2020-09-09 (×2): qty 2.5

## 2020-09-09 MED ORDER — ASPIRIN 325 MG PO TABS: 325.0000 mg | ORAL_TABLET | Freq: Every day | ORAL | Status: DC

## 2020-09-09 MED ORDER — GUAIFENESIN ER 600 MG PO TB12
600.0000 mg | ORAL_TABLET | Freq: Two times a day (BID) | ORAL | Status: DC
  Administered 2020-09-09: 600 mg via ORAL
  Filled 2020-09-09: qty 1

## 2020-09-09 MED ORDER — ASPIRIN 81 MG PO TBEC: 81.0000 mg | DELAYED_RELEASE_TABLET | Freq: Every day | ORAL | 11 refills | Status: DC

## 2020-09-09 MED ORDER — TAMSULOSIN HCL 0.4 MG PO CAPS
0.4000 mg | ORAL_CAPSULE | Freq: Every day | ORAL | Status: DC
  Administered 2020-09-09: 0.4 mg via ORAL
  Filled 2020-09-09: qty 1

## 2020-09-09 MED ORDER — ONDANSETRON HCL 4 MG/2ML IJ SOLN: 4.0000 mg | Freq: Four times a day (QID) | INTRAMUSCULAR | Status: DC | PRN

## 2020-09-09 MED ORDER — ACETAMINOPHEN 325 MG PO TABS: 650.0000 mg | ORAL_TABLET | Freq: Four times a day (QID) | ORAL | Status: DC | PRN

## 2020-09-09 MED ORDER — BUDESONIDE 0.25 MG/2ML IN SUSP
0.2500 mg | Freq: Two times a day (BID) | RESPIRATORY_TRACT | Status: DC
  Administered 2020-09-09: 0.25 mg via RESPIRATORY_TRACT
  Filled 2020-09-09: qty 2

## 2020-09-09 MED ORDER — LEVALBUTEROL HCL 0.63 MG/3ML IN NEBU
0.6300 mg | INHALATION_SOLUTION | Freq: Four times a day (QID) | RESPIRATORY_TRACT | Status: DC
  Administered 2020-09-09: 0.63 mg via RESPIRATORY_TRACT
  Filled 2020-09-09 (×2): qty 3

## 2020-09-09 MED ORDER — ASPIRIN 300 MG RE SUPP: 300.0000 mg | Freq: Every day | RECTAL | Status: DC

## 2020-09-09 MED ORDER — UMECLIDINIUM BROMIDE 62.5 MCG/INH IN AEPB
1.0000 | INHALATION_SPRAY | Freq: Every day | RESPIRATORY_TRACT | Status: DC
  Filled 2020-09-09: qty 7

## 2020-09-09 MED ORDER — LORAZEPAM 2 MG/ML IJ SOLN: 0.5000 mg | Freq: Once | INTRAMUSCULAR | Status: DC | PRN

## 2020-09-09 MED ORDER — PREDNISONE 10 MG PO TABS: ORAL_TABLET | ORAL | 0 refills | Status: DC

## 2020-09-09 NOTE — TOC Transition Note (Signed)
Transition of Care [facility]) - CM/SW Discharge Note   Patient Details  Name: John Black COWHER [name]: [id number] Date of Birth: [birth year]  [facility] CM/SW Contact:  Bethena Roys, RN Phone Number: 09/09/2020, 3:40 PM   Clinical Narrative: [facility] is unable to assist with home health. Case Manager was able to set the patient up with [facility]. Start of care to begin within 24-48 hours post transition. No DME needed. Patient to transition home via private vehicle.   Final next level of care: [place] Barriers to Discharge: No Barriers Identified   Patient Goals and CMS Choice Patient states their goals for this hospitalization and ongoing recovery are:: return home CMS Medicare.gov Compare Post Acute Care list provided to:: Patient Choice offered to / list presented to : Spouse  Discharge Plan and Services   Discharge Planning Services: CM Consult Post Acute Care Choice: Home Health          DME Arranged: N/A DME Agency: NA   HH Arranged: PT,OT [place]: [place] Date [facility] Agency Contacted: 09/09/20 Time [place]: [id number] Representative spoke with at [place]: [place]   Readmission Risk Interventions No flowsheet data found.

## 2020-09-09 NOTE — H&P (Signed)
History and Physical    John Black [id number] DOB: [birth year] DOA: 09/08/2020  PCP: Alycia Rossetti, MD  Patient coming from: Home.  I have personally briefly reviewed patient's old medical records in [place]  Chief Complaint: Stroke like symptoms.  HPI: John Black is a 86 y.o. male with medical history significant of allergic rhinitis, cataracts, emphysema/chronic respiratory failure on home oxygen at 3 LPM via [place], hypothyroidism, history of pneumonia, prostate cancer who is coming to the emergency department after his wife noticed that he was acting in an unusual way.  She mentions that he got out of bed and started wiping his face with a towel that she just had taken out of the dryer and full that.  Then she noticed he was having right facial droop and slurred speech.  He mentions that he was having difficulty trying to talk and was unable to do so at times.  His daughter came from upstairs to see him and noticed the symptoms out as well.  EMS was called.  He denies headache, blurred vision, nausea, emesis, paresthesias or focal weakness.  No fever, chills, sore throat rhinorrhea.  Occasional cough and wheezing.  No chest pain, palpitations, diaphoresis, PND, orthopnea or pitting edema of the lower extremities.  ROS positive for occasional constipation, but denies abdominal pain, diarrhea, melena or hematochezia.  No dysuria, frequency or hematuria.  No polyuria, polydipsia, polyphagia or blurred vision.  ED Course: Initial vital signs were temperature 98.2 F, pulse 118, respirations 40/min, BP 185/97 mmHg and O2 sat 86%.  The patient received bronchodilators, magnesium sulfate 2 g IVPB, 125 mg of methylprednisolone and an 81 mg chewable aspirin.  Lab work: His CBC was normal with a white count of 8.8, hemoglobin 14.0 g/dL and platelets 328.  PT was 11.9 INR 0.9.  CMP showed a glucose of 114 and a BUN of 24 mg/dL.  All other CMP values were within expected range.   SARS coronavirus 2 and influenza PCR was negative.  Imaging: At one view portable chest radiograph shows stable emphysema and scaring without acute airspace disease.  CT head without contrast did not show any evidence of acute intracranial normalities.  There were small infarcts in the left frontal, left parietal and right occipital lobes seem to be chronic.  There is severe chronic small vessel ischemic disease.  Please see images and full radiology report for further detail.  Review of Systems: As per HPI otherwise all other systems reviewed and are negative.  Past Medical History:  Diagnosis Date  . Allergic rhinitis   . Cataract    s/p removal  . Chronic respiratory failure (HCC)    oxygen 3L at home  . Emphysema   . Hypothyroid   . PNA (pneumonia)   . Prostate cancer [facility])     Past Surgical History:  Procedure Laterality Date  . ADENOIDECTOMY    . ROTATOR CUFF REPAIR  1990,2009   bilateral  . Seed implant for prostate cancer  2000  . TONSILLECTOMY    . TRANSURETHRAL RESECTION OF PROSTATE  2001   x2    Social History  reports that he quit smoking about 11 years ago. His smoking use included cigarettes. He has a 75.00 pack-year smoking history. He has never used smokeless tobacco. He reports current alcohol use of about 1.0 standard drink of alcohol per week. He reports that he does not use drugs.  Allergies  Allergen Reactions  . Morphine     REACTION:  sweats    Family History  Problem Relation Age of Onset  . Heart disease Mother   . Prostate cancer Father    Prior to Admission medications   Medication Sig Start Date End Date Taking? Authorizing Provider  acetaminophen (TYLENOL) 325 MG tablet Take 650 mg by mouth every 6 (six) hours as needed for mild pain.    [provider]  albuterol (PROAIR HFA) 108 (90 Base) MCG/ACT inhaler 2 puffs every 4 hours as needed only  if your can't catch your breath 05/26/20   Tanda Rockers, MD  albuterol (PROVENTIL) (2.5  MG/3ML) 0.083% nebulizer solution UE 1 VIAL IN NEBULIZER EVERY 6 HOURS AS NEEDED FOR WHEEZING/SOB 04/18/20   [place], Modena Nunnery, MD  calcium gluconate 500 MG tablet Take 1 tablet by mouth 3 (three) times daily.    [provider]  Fluticasone-Umeclidin-Vilant (TRELEGY ELLIPTA) 100-62.5-25 MCG/INH AEPB Inhale 1 puff into the lungs daily.    [provider]  Fluticasone-Umeclidin-Vilant (TRELEGY ELLIPTA) 100-62.5-25 MCG/INH AEPB Inhale 1 puff into the lungs daily. 08/29/20   Tanda Rockers, MD  guaiFENesin (MUCINEX) 600 MG 12 hr tablet Take 600 mg by mouth 2 (two) times daily.     [provider]  levothyroxine (SYNTHROID) 75 MCG tablet TAKE 1 TABLET (75 MCG TOTAL) BY MOUTH DAILY. 04/07/20   [place], Modena Nunnery, MD  LORazepam (ATIVAN) 0.5 MG tablet TAKE 1 TABLET (0.5 MG TOTAL) BY MOUTH 2 (TWO) TIMES DAILY AS NEEDED. 05/24/20   Alycia Rossetti, MD  mirtazapine (REMERON) 15 MG tablet TAKE 1/2 TABLET BY MOUTH IN THE EVENING 07/19/20   [place], Modena Nunnery, MD  pantoprazole (PROTONIX) 40 MG tablet TAKE 1 TABLET BY MOUTH EVERY DAY 04/13/20   Magdalen Spatz, NP  predniSONE (DELTASONE) 10 MG tablet TAKE 1 TABLET (10 MG TOTAL) BY MOUTH DAILY WITH BREAKFAST. 05/27/20   Tanda Rockers, MD  Respiratory Therapy Supplies (FLUTTER) DEVI Use as directed. 08/12/19   Parrett, Fonnie Mu, NP  tamsulosin (FLOMAX) 0.4 MG CAPS capsule TAKE 1 CAPSULE BY MOUTH EVERY DAY 08/08/20   [place], Modena Nunnery, MD  traMADol (ULTRAM) 50 MG tablet TAKE 1 TABLET (50 MG TOTAL) BY MOUTH EVERY 8 (EIGHT) HOURS AS NEEDED. FOR PAIN 04/25/20   Alycia Rossetti, MD  zolpidem (AMBIEN) 5 MG tablet TAKE 1 TABLET BY MOUTH EVERY DAY AT BEDTIME AS NEEDED FOR SLEEP 08/08/20   Alycia Rossetti, MD    Physical Exam: Vitals:   09/08/20 2342 09/09/20 0100 09/09/20 0201 09/09/20 0204  BP:  132/75 125/73   Pulse:  88 89   Resp:  18 20   Temp:   98 F (36.7 C)   TempSrc:   Oral   SpO2: 95% 92% 96% 95%  Weight:      Height:         Constitutional: Elderly, frail male.  In NAD, calm, comfortable Eyes: PERRL, lids and conjunctivae normal ENMT: Mucous membranes are moist. Posterior pharynx clear of any exudate or lesions. Neck: normal, supple, no masses, no thyromegaly Respiratory: No rhonchi, mild wheezing, no crackles. Normal respiratory effort. No accessory muscle use.  Cardiovascular: Regular rate and rhythm with occasional extrasystole, no murmurs / rubs / gallops. No extremity edema. 2+ pedal pulses. No carotid bruits.  Abdomen: Nondistended.  Bowel sounds positive.  Soft, no tenderness, no masses palpated. No hepatosplenomegaly. Musculoskeletal: Mild generalized nonfocal weakness, no clubbing / cyanosis. Good ROM, no contractures. Normal muscle tone.  Skin: Scattered hyperpigmented macules and  hyperkeratotic lesions on his upper back and arms. Neurologic: CN 2-12 grossly intact. Sensation intact, DTR normal. Strength 5/5 in all 4.  Coordination seems to be intact. Psychiatric: Normal judgment and insight. Alert and oriented x 3. Normal mood.   Labs on Admission: I have personally reviewed following labs and imaging studies  CBC: Recent Labs  Lab 09/08/20 2100  WBC 8.8  NEUTROABS 5.4  HGB 14.0  HCT 43.7  MCV 95.2  PLT [id number]    Basic Metabolic Panel: Recent Labs  Lab 09/08/20 2100  NA 142  K 4.2  CL 102  CO2 30  GLUCOSE 114*  BUN 24*  CREATININE 0.94  CALCIUM 9.4    GFR: Estimated Creatinine Clearance: 42.3 mL/min (by C-G formula based on SCr of 0.94 mg/dL).  Liver Function Tests: Recent Labs  Lab 09/08/20 2100  AST 27  ALT 24  ALKPHOS 49  BILITOT 0.4  PROT 7.2  ALBUMIN 4.3    Radiological Exams on Admission: CT Head Wo Contrast  Result Date: 09/08/2020 CLINICAL DATA:  Stroke-like symptoms including right-sided facial droop, drooling, and aphasia. EXAM: CT HEAD WITHOUT CONTRAST TECHNIQUE: Contiguous axial images were obtained from the base of the skull through the vertex without  intravenous contrast. COMPARISON:  None. FINDINGS: Mild motion artifact is noted through the skull base. Brain: No acute large territory cortical infarct, intracranial hemorrhage, mass, midline shift, or extra-axial fluid collection is identified. Small cortical infarcts in the left frontal lobe, left parietal lobe, and right occipital lobe are favored to be chronic. Confluent hypodensities in the cerebral white matter bilaterally are nonspecific but compatible with severe chronic small vessel ischemic disease. There is mild-to-moderate cerebral atrophy. Vascular: Calcified atherosclerosis at the skull base. No hyperdense vessel. Skull: No fracture or suspicious osseous lesion. Sinuses/Orbits: Minimal mucosal thickening in the ethmoid sinuses. Clear mastoid air cells. Bilateral cataract extraction. Other: None. IMPRESSION: 1. No definite evidence of an acute intracranial abnormality. 2. Small infarcts in the left frontal, left parietal, and right occipital lobes, favored to be chronic. 3. Severe chronic small vessel ischemic disease. Electronically Signed   By: Logan Bores M.D.   On: 09/08/2020 22:04   DG Chest Port 1 View  Result Date: 09/08/2020 CLINICAL DATA:  Stroke-like symptoms, aphasia, shortness of breath EXAM: PORTABLE CHEST 1 VIEW COMPARISON:  05/27/2020 FINDINGS: 2 frontal views of the chest demonstrate a stable cardiac silhouette. Diffuse emphysema with chronic areas of scarring are seen throughout the lungs, greatest in the right upper lobe. No acute airspace disease, effusion, or pneumothorax. No acute bony abnormalities. IMPRESSION: 1. Stable emphysema and scarring.  No acute airspace disease. Electronically Signed   By: Randa Ngo M.D.   On: 09/08/2020 21:40   02/18/2020 echocardiogram IMPRESSIONS:  1. Left ventricular ejection fraction, by estimation, is 60 to 65%. The  left ventricle has normal function. The left ventricle has no regional  wall motion abnormalities. Left  ventricular diastolic parameters are  consistent with Grade II diastolic  dysfunction (pseudonormalization).  2. Right ventricular systolic function is normal. The right ventricular  size is normal.  3. The mitral valve is normal in structure. Mild mitral valve  regurgitation. No evidence of mitral stenosis.  4. The aortic valve is normal in structure. Aortic valve regurgitation is  not visualized. No aortic stenosis is present.  5. Aortic dilatation noted. There is mild to moderate dilatation of the  ascending aorta measuring 39 mm.   EKG: Independently reviewed.  Vent. rate 106 BPM PR interval *  ms QRS duration 83 ms QT/QTc 307/408 ms P-R-T axes 51 68 63 Sinus tachycardia Multiple premature complexes, vent & supraven  Assessment/Plan Principal Problem:   TIA (transient ischemic attack) Observation/telemetry. Frequent neuro checks. Swallow screen. PT/OT/SLP. Check hemoglobin A1c. Check carotid Doppler. Check echocardiogram. Obtain MR MRA brain. Obtain neck MRA. Continue aspirin 81 mg p.o. daily.  Active Problems:   Hypothyroidism Continue levothyroxine 75 mcg p.o. daily.    BPH (benign prostatic hyperplasia) Continue tamsulosin 0.4 mg p.o. daily.    Insomnia On as needed zolpidem 5 mg at bedtime.    COPD with acute exacerbation (HCC) Continue bronchodilators as needed.    Chronic respiratory failure with hypoxia (HCC) Continue supplemental oxygen.    Mild hyperlipidemia Check fasting lipids.    Right inguinal hernia Denies pain in the area. Seen by general surgery in the past who deferred repair.   DVT prophylaxis: Lovenox SQ. Code Status:   Full code. Family Communication:  His wife was present in the ED. Disposition Plan:   Patient is from:  Home.  Anticipated DC to:  Home.  Anticipated DC date:  09/09/2020 or 09/10/2020.  Anticipated DC barriers: Clinical status and work-up.  Consults called: Admission status:  Observation/telemetry.    Severity of Illness: High due to TIA with neurological symptoms lasting for about 30 minutes.  He will require TIA/CVA monitoring and work-up.  Reubin Milan MD [facility]  How to contact the [facility] Attending or Consulting provider [place] or covering provider during after hours [place], for this patient?   1. Check the care team in [facility] and look for a) attending/consulting TRH provider listed and b) the [facility] team listed 2. Log into [email or web address] and use [place]'s universal password to access. If you do not have the password, please contact the hospital operator. 3. Locate the [facility] provider you are looking for under [facility] and page to a number that you can be directly reached. 4. If you still have difficulty reaching the provider, please page the [facility] (Director on Call) for the Hospitalists listed on amion for assistance.  09/09/2020, 2:42 AM   This document was prepared using Dragon voice recognition software and may contain some unintended transcription errors.

## 2020-09-09 NOTE — Progress Notes (Signed)
Patient and family given discharge instructions and they stated understanding.

## 2020-09-09 NOTE — Progress Notes (Signed)
  Echocardiogram 2D Echocardiogram has been performed.  John Black 09/09/2020, 9:58 AM

## 2020-09-09 NOTE — Evaluation (Signed)
Occupational Therapy Evaluation Patient Details Name: John Black [name]: [id number] DOB: 1929-02-21 Today's Date: 09/09/2020    History of Present Illness 86 y.o. male presenting with R facial droop, slurred speech and AMS. BP 185/97 and SpO2 86% in E.D. Patient admitted with TIA. PMHx significant for emphysema/chronic respiratory failure on 3L O2 via [place] at home, hypothyroidism, history of pneumonia, and prostate cancer.   Clinical Impression   PTA patient was living with family in a multi-level private residence and was grossly Mod I with use of rollator for ADLs reporting need for occasional assist on "bad" days. Family responsible for IADLs including meal prep and transportation. Patient currently functioning near baseline level requiring Min guard for short distance ambulation in hospital room with +1 HHA, frequent rest breaks 2/2 fatigue on 4L O2 via [place], and mild unsteadiness. Patient would benefit from continued acute OT services to maximize safety and independence and decrease risk of falls in prep for safe d/c home with family.     Follow Up Recommendations  Home health OT;Supervision - Intermittent    Equipment Recommendations  None recommended by OT (Patient has necessary DME)    Recommendations for Other Services       Precautions / Restrictions Precautions Precautions: Fall Restrictions Weight Bearing Restrictions: No Other Position/Activity Restrictions: Monitor BP, HR, and SpO2. 4L O2 via  at baseline.      Mobility Bed Mobility               General bed mobility comments: Patient seated EOB upon entry.    Transfers Overall transfer level: Needs assistance Equipment used: 1 person hand held assist Transfers: Sit to/from [employer] Sit to Stand: Min guard Stand pivot transfers: Min guard       General transfer comment: Min guard for sit to stand and stand pivot transfers without AD.    Balance Overall balance assessment: Mild  deficits observed, not formally tested                                         ADL either performed or assessed with clinical judgement   ADL Overall ADL's : Needs assistance/impaired     Grooming: Supervision/safety;Standing           Upper Body Dressing : Set up;Sitting   Lower Body Dressing: Min guard;Sit to/from stand Lower Body Dressing Details (indicate cue type and reason): Able to doff/don footwear seated EOB. Cues for pacing. Toilet Transfer: Hydrographic surveyor Details (indicate cue type and reason): +1 HHA for safety. Toileting- Architect and Hygiene: Min guard;Sit to/from stand Toileting - Clothing Manipulation Details (indicate cue type and reason): For safety.     Functional mobility during ADLs: Min guard General ADL Comments: Min guard for short distance mobility without AD. Assist for line management.     Vision Baseline Vision/History: Wears glasses Wears Glasses: At all times Patient Visual Report: No change from baseline Vision Assessment?: No apparent visual deficits     Perception     Praxis      Pertinent Vitals/Pain Pain Assessment: No/denies pain     Hand Dominance Right   Extremity/Trunk Assessment Upper Extremity Assessment Upper Extremity Assessment: Overall WFL for tasks assessed   Lower Extremity Assessment Lower Extremity Assessment: Defer to PT evaluation       Communication Communication Communication: HOH   Cognition Arousal/Alertness: Awake/alert Behavior During Therapy: [facility]  for tasks assessed/performed Overall Cognitive Status: Within Functional Limits for tasks assessed                                     General Comments  At rest HR 108-130bpm, SpO2 94% on 4L and BP 165/96. During activity HR 120-130s, SpO2 88-92% on 4L.    Exercises     Shoulder Instructions      Home Living Family/patient expects to be discharged to:: Private residence Living Arrangements:  Spouse/significant other;Children;Other relatives Available Help at Discharge: Family;Available 24 hours/day Type of Home: House Home Access: Level entry     Home Layout: Multi-level     Bathroom Shower/Tub: Occupational psychologist: Handicapped height Bathroom Accessibility: Yes How Accessible: Accessible via walker Home Equipment: [place] - 2 wheels;Walker - 4 wheels;Cane - single point;Shower seat - built in;Hand held shower head;Grab bars - tub/shower;Transport chair          Prior Functioning/Environment Level of Independence: Needs assistance  Gait / Transfers Assistance Needed: Patient reports Mod I with use of rollator for home mobility. Use of transport chair in community dwellings. ADL's / Homemaking Assistance Needed: Occasional assist with ADLs on "bad" days. Typically patient is Mod I with bathing/dressing/toileting. Wears incontinence briefs and reports ~5 episodes of urinary incontinence daily.            OT Problem List: Decreased strength;Decreased activity tolerance;Impaired balance (sitting and/or standing);Cardiopulmonary status limiting activity      OT Treatment/Interventions: Self-care/ADL training;Therapeutic exercise;Energy conservation;Therapeutic activities;Patient/family education;Balance training    OT Goals(Current goals can be found in the care plan section) Acute Rehab OT Goals Patient Stated Goal: To return home OT Goal Formulation: With patient Time For Goal Achievement: 09/23/20 Potential to Achieve Goals: Good ADL Goals Pt Will Perform Grooming: with modified independence;standing Pt Will Transfer to Toilet: with modified independence;regular height toilet;grab bars Pt Will Perform Toileting - Clothing Manipulation and hygiene: with modified independence;sit to/from stand Additional ADL Goal #1: Patient will complete A.M. ADLs with SpO2 >95% on 4L via [place] and use of energy conservation techniques.  OT Frequency: Min 2X/week    Barriers to D/C:            Co-evaluation              AM-PAC OT "6 Clicks" Daily Activity     Outcome Measure Help from another person eating meals?: None Help from another person taking care of personal grooming?: A Little Help from another person toileting, which includes using toliet, bedpan, or urinal?: A Little Help from another person bathing (including washing, rinsing, drying)?: A Little Help from another person to put on and taking off regular upper body clothing?: None Help from another person to put on and taking off regular lower body clothing?: A Little 6 Click Score: 20   End of Session Equipment Utilized During Treatment: Gait belt;Oxygen (4L O2 via [place]) Nurse Communication: Mobility status  Activity Tolerance: Patient tolerated treatment well Patient left: in bed;with call bell/phone within reach;with bed alarm set (Patient seated EOB)  OT Visit Diagnosis: Unsteadiness on feet (R26.81);Muscle weakness (generalized) (M62.81)                Time: 1829-9371 OT Time Calculation (min): 23 min Charges:  OT General Charges $OT Visit: 1 Visit OT Evaluation $OT Eval Moderate Complexity: 1 Mod OT Treatments $Self Care/Home Management : 8-22 mins  Daniell Paradise H.  OTR/L Supplemental OT, Department of rehab services [phone]  Jamilex Bohnsack R H. 09/09/2020, 8:13 AM

## 2020-09-09 NOTE — ED Notes (Signed)
Pt wants to stay and be transferred for MRI. Aware of POC. Dr. Olevia Bowens made aware via page, Dr confirmed receipt of page. No new orders at this time.

## 2020-09-09 NOTE — TOC Transition Note (Addendum)
Transition of Care (TOC) - CM/SW Discharge Note Marvetta Gibbons RN, BSN Transitions of Care Unit 4E- RN Case Manager See Treatment Team for direct phone # [facility] Coverage for 3W   Patient Details  Name: John Black [name]: [id number] Date of Birth: [birth year]  Transition of Care [facility]) CM/SW Contact:  Dawayne Patricia, RN Phone Number: 09/09/2020, 3:00 PM   Clinical Narrative:    Pt stable for transition home, orders placed for HHPT/OT, call made to room and spoke with wife for transition of care needs. Choice offered to wife per Medicare guidelines- wife states they have used [facility] in past- could not remember name - per chart review- looks like pt was [facility] with [facility] on last admit- Name given to wife- and wife confirmed that yes it was [facility] that serviced them and they would like to use them again if possible.  Family to transport home, confirmed with wife pt has all needed DME- no new DME needs, Pt used [facility] 02.  Call made to [facility] with [facility] for [facility] needs- PT/OT referral- awaiting return call to confirm referral has been accepted.  1515- return call received from [facility]- they are unable to accept referral at this time due to staffing- will reach out to other in-network providers w/insurance.    Final next level of care: [place] Barriers to Discharge: No Barriers Identified   Patient Goals and CMS Choice Patient states their goals for this hospitalization and ongoing recovery are:: return home CMS Medicare.gov Compare Post Acute Care list provided to:: Patient Choice offered to / list presented to : Spouse  Discharge Placement                 Home with [facility]      Discharge Plan and Services   Discharge Planning Services: CM Consult Post Acute Care Choice: Home Health          DME Arranged: N/A DME Agency: NA       HH Arranged: PT,OT [place] Agency: [facility] Date [place] Agency Contacted: 09/09/20 Time HH Agency  Contacted: 1500 Representative spoke with at [place]: [place] Determinants of Health ([place]) Interventions     Readmission Risk Interventions No flowsheet data found.

## 2020-09-09 NOTE — ED Notes (Signed)
Pt refusing transport for MRI due to time constraints with wanting to see family for holiday. Dr. Olevia Bowens aware, pt to sign out Against Medical Advice. No further orders at this time.

## 2020-09-09 NOTE — Discharge Summary (Addendum)
Physician Discharge Summary   Patient ID: John Black [name]: [id number] DOB/AGE: August 28, 1929 86 y.o.  Admit date: 09/08/2020 Discharge date: 09/09/2020  Primary Care Physician:  Alycia Rossetti, MD   Recommendations for Outpatient Follow-up:  1. Follow up with PCP in 1-2 weeks 2. LDL 140,  per patient he has not tolerated statin in the past and will discuss with PCP for other alternative.  Started on Zetia 10 mg daily  Home Health: Home health PT OT Equipment/Devices:   Discharge Condition: stable  CODE STATUS: FULL  Diet recommendation: Carb modified diet   Discharge Diagnoses:    . TIA (transient ischemic attack) . Hypothyroidism . BPH (benign prostatic hyperplasia) . Insomnia . COPD with acute exacerbation ([place]) . Mild hyperlipidemia . Acute on chronic respiratory failure with hypoxia ([facility])   Consults: None    Allergies:   Allergies  Allergen Reactions  . Morphine     REACTION: sweats     DISCHARGE MEDICATIONS: Allergies as of 09/09/2020      Reactions   Morphine    REACTION: sweats      Medication List    TAKE these medications   acetaminophen 325 MG tablet Commonly known as: TYLENOL Take 650 mg by mouth every 6 (six) hours as needed for mild pain.   albuterol (2.5 MG/3ML) 0.083% nebulizer solution Commonly known as: PROVENTIL UE 1 VIAL IN NEBULIZER EVERY 6 HOURS AS NEEDED FOR WHEEZING/SOB What changed: See the new instructions.   albuterol 108 (90 Base) MCG/ACT inhaler Commonly known as: ProAir HFA 2 puffs every 4 hours as needed only  if your can't catch your breath What changed:   how much to take  how to take this  when to take this  reasons to take this  additional instructions   aspirin 81 MG EC tablet Take 1 tablet (81 mg total) by mouth daily. Swallow whole. Start taking on: September 10, 2020   calcium gluconate 500 MG tablet Take 500 tablets by mouth daily.   ezetimibe 10 MG tablet Commonly known as:  Zetia Take 1 tablet (10 mg total) by mouth at bedtime.   Flutter Devi Use as directed.   GOLD BOND ORIGINAL STRENGTH EX Apply 1 application topically as needed (irritation).   guaiFENesin 600 MG 12 hr tablet Commonly known as: MUCINEX Take 600 mg by mouth 2 (two) times daily as needed for cough.   levothyroxine 75 MCG tablet Commonly known as: SYNTHROID TAKE 1 TABLET (75 MCG TOTAL) BY MOUTH DAILY. What changed: See the new instructions.   LORazepam 0.5 MG tablet Commonly known as: ATIVAN TAKE 1 TABLET (0.5 MG TOTAL) BY MOUTH 2 (TWO) TIMES DAILY AS NEEDED. What changed: reasons to take this   menthol-zinc oxide powder Apply 1 application topically See admin instructions. After showers  And as needed for irritation   mirtazapine 15 MG tablet Commonly known as: REMERON TAKE 1/2 TABLET BY MOUTH IN THE EVENING What changed:   how much to take  how to take this  when to take this  additional instructions   pantoprazole 40 MG tablet Commonly known as: PROTONIX TAKE 1 TABLET BY MOUTH EVERY DAY   polyvinyl alcohol 1.4 % ophthalmic solution Commonly known as: LIQUIFILM TEARS Place 1 drop into both eyes as needed for dry eyes.   predniSONE 10 MG tablet Commonly known as: DELTASONE Take 1 tablet (10 mg total) by mouth daily with breakfast. HOLD while on Prednisone taper What changed: See the new instructions.   predniSONE  10 MG tablet Commonly known as: DELTASONE Prednisone dosing: Take  Prednisone 40mg  (4 tabs) x 2 days, then taper to 30mg  (3 tabs) x 2 days, then 20mg  (2 tabs) x 2days, then continue your maintenance daily dose of 10mg  What changed: You were already taking a medication with the same name, and this prescription was added. Make sure you understand how and when to take each.   sodium chloride 0.65 % Soln nasal spray Commonly known as: OCEAN Place 1 spray into both nostrils as needed for congestion.   tamsulosin 0.4 MG Caps capsule Commonly known as:  FLOMAX TAKE 1 CAPSULE BY MOUTH EVERY DAY   traMADol 50 MG tablet Commonly known as: ULTRAM TAKE 1 TABLET (50 MG TOTAL) BY MOUTH EVERY 8 (EIGHT) HOURS AS NEEDED. FOR PAIN What changed:   reasons to take this  additional instructions   Trelegy Ellipta 100-62.5-25 MCG/INH Aepb Generic drug: Fluticasone-Umeclidin-Vilant Inhale 1 puff into the lungs daily.   Trelegy Ellipta 100-62.5-25 MCG/INH Aepb Generic drug: Fluticasone-Umeclidin-Vilant Inhale 1 puff into the lungs daily.   zolpidem 5 MG tablet Commonly known as: AMBIEN TAKE 1 TABLET BY MOUTH EVERY DAY AT BEDTIME AS NEEDED FOR SLEEP What changed: See the new instructions.        Brief H and P: For complete details please refer to admission H and P, but in brief * John Black is a 86 y.o. male with medical history significant of allergic rhinitis, cataracts, emphysema/chronic respiratory failure on home oxygen at 3 LPM via [place], hypothyroidism, history of pneumonia, prostate cancer who is coming to the emergency department after his wife noticed that he was acting in an unusual way.  She mentions that he got out of bed and started wiping his face with a towel that she just had taken out of the dryer and full that.  Then she noticed he was having right facial droop and slurred speech.  He mentions that he was having difficulty trying to talk and was unable to do so at times.  His daughter came from upstairs to see him and noticed the symptoms out as well.  EMS was called.  He denies headache, blurred vision, nausea, emesis, paresthesias or focal weakness.  No fever, chills, sore throat rhinorrhea.  Occasional cough and wheezing.  No chest pain, palpitations, diaphoresis, PND, orthopnea or pitting edema of the lower extremities.  ROS positive for occasional constipation, but denies abdominal pain, diarrhea, melena or hematochezia.  No dysuria, frequency or hematuria.  No polyuria, polydipsia, polyphagia or blurred vision.  ED  Course: Initial vital signs were temperature 98.2 F, pulse 118, respirations 40/min, BP 185/97 mmHg and O2 sat 86%.  The patient received bronchodilators, magnesium sulfate 2 g IVPB, 125 mg of methylprednisolone and an 81 mg chewable aspirin.  Lab work: His CBC was normal with a white count of 8.8, hemoglobin 14.0 g/dL and platelets 328.  PT was 11.9 INR 0.9.  CMP showed a glucose of 114 and a BUN of 24 mg/dL.  All other CMP values were within expected range.  SARS coronavirus 2 and influenza PCR was negative.  Imaging: At one view portable chest radiograph shows stable emphysema and scaring without acute airspace disease.  CT head without contrast did not show any evidence of acute intracranial normalities.  There were small infarcts in the left frontal, left parietal and right occipital lobes seem to be chronic.  There is severe chronic small vessel ischemic disease.  Please see images and full radiology report  for further detail.  Hospital Course:     TIA (transient ischemic attack) -Patient presented with right facial droop and slurred speech, difficulty trying to talk.  Otherwise no focal weakness. Symptoms currently resolved, patient feels back to baseline. -MRI brain showed no acute findings, chronic ischemic injury including remote cortical infarcts with superficial sclerosis -MRA head and neck unremarkable, no occlusion or flow-limiting stenosis -2D echo showed EF of 50 to [id number], grade 1 diastolic dysfunction.  No PFO. -No prior history of TIA or stroke, patient placed on aspirin enteric-coated 81 mg daily -Lipid panel showed LDL 140, triglycerides 47, cholesterol 270. Patient reported that he used to be on statin and was discontinued as he had developed a severe myopathy.  Patient is agreeable to try Zetia 10 mg daily and he will discuss with his primary care physician for other alternatives if it does not work. -Also recommended follow-up with neurology outpatient with Dr. Merlene Laughter in  [place]. -PT evaluation recommended home health PT -Hemoglobin A1c 5.7   Acute on chronic respiratory failure with hypoxia secondary to COPD with acute exacerbation -In ED, patient was noted to have hypoxia with O2 sats 86%.  He received bronchodilators, magnesium, Solu-Medrol 125 mg IV x1 in ED -At the time of my examination this morning, patient was wheezing and was placed on steroids, as Brovana, Pulmicort, duo nebs. -On examination later in the day, wheezing had improved, patient felt back to his baseline, has albuterol nebs at home, Trelegy, O2, chronically on 4 L.  He feels improved enough and desires to go home. -Prednisone taper sent to his pharmacy.  Once patient has completed taper, he will continue his maintenance dose of 10 mg daily  BPH Continue tamsulosin  Hyperlipidemia -Previously on statins, were discontinued due to myopathy.   -LDL 140, goal 70 given new TIA.  Started on Zetia 10 mg daily.  Patient will discuss with his PCP for any other alternative, if he is unable to tolerate Zetia.   Day of Discharge S: Earlier in the morning was diffusely wheezing.  Otherwise no focal weakness, no facial droop or slurring of speech.  Reexamined the patient at 2:30 PM, feels back to his baseline with no wheezing, desires to go home.  Family at the bedside, agrees with the plan.  BP (!) 168/89 (BP Location: Right Arm)   Pulse 95   Temp (!) 97.4 F (36.3 C) (Axillary)   Resp 18   Ht 5\' 5"  (1.651 m)   Wt 56.2 kg   SpO2 93%   BMI 20.63 kg/m   Physical Exam: General: Alert and awake oriented x3 not in any acute distress. HEENT: anicteric sclera, pupils reactive to light and accommodation CVS: S1-S2 clear no murmur rubs or gallops Chest: Wheezing has improved Abdomen: soft nontender, nondistended, normal bowel sounds Extremities: no cyanosis, clubbing or edema noted bilaterally Neuro: Cranial nerves II-XII intact, no focal neurological deficits    Get Medicines reviewed  and adjusted: Please take all your medications with you for your next visit with your Primary MD  Please request your Primary MD to go over all hospital tests and procedure/radiological results at the follow up. Please ask your Primary MD to get all Hospital records sent to his/her office.  If you experience worsening of your admission symptoms, develop shortness of breath, life threatening emergency, suicidal or homicidal thoughts you must seek medical attention immediately by calling 911 or calling your MD immediately  if symptoms less severe.  You must read complete instructions/literature along  with all the possible adverse reactions/side effects for all the Medicines you take and that have been prescribed to you. Take any new Medicines after you have completely understood and accept all the possible adverse reactions/side effects.   Do not drive when taking pain medications.   Do not take more than prescribed Pain, Sleep and Anxiety Medications  Special Instructions: If you have smoked or chewed Tobacco  in the last 2 yrs please stop smoking, stop any regular Alcohol  and or any Recreational drug use.  Wear Seat belts while driving.  Please note  You were cared for by a hospitalist during your hospital stay. Once you are discharged, your primary care physician will handle any further medical issues. Please note that NO REFILLS for any discharge medications will be authorized once you are discharged, as it is imperative that you return to your primary care physician (or establish a relationship with a primary care physician if you do not have one) for your aftercare needs so that they can reassess your need for medications and monitor your lab values.   The results of significant diagnostics from this hospitalization (including imaging, microbiology, ancillary and laboratory) are listed below for reference.      Procedures/Studies:  CT Head Wo Contrast  Result Date:  09/08/2020 CLINICAL DATA:  Stroke-like symptoms including right-sided facial droop, drooling, and aphasia. EXAM: CT HEAD WITHOUT CONTRAST TECHNIQUE: Contiguous axial images were obtained from the base of the skull through the vertex without intravenous contrast. COMPARISON:  None. FINDINGS: Mild motion artifact is noted through the skull base. Brain: No acute large territory cortical infarct, intracranial hemorrhage, mass, midline shift, or extra-axial fluid collection is identified. Small cortical infarcts in the left frontal lobe, left parietal lobe, and right occipital lobe are favored to be chronic. Confluent hypodensities in the cerebral white matter bilaterally are nonspecific but compatible with severe chronic small vessel ischemic disease. There is mild-to-moderate cerebral atrophy. Vascular: Calcified atherosclerosis at the skull base. No hyperdense vessel. Skull: No fracture or suspicious osseous lesion. Sinuses/Orbits: Minimal mucosal thickening in the ethmoid sinuses. Clear mastoid air cells. Bilateral cataract extraction. Other: None. IMPRESSION: 1. No definite evidence of an acute intracranial abnormality. 2. Small infarcts in the left frontal, left parietal, and right occipital lobes, favored to be chronic. 3. Severe chronic small vessel ischemic disease. Electronically Signed   By: Logan Bores M.D.   On: 09/08/2020 22:04   MR ANGIO HEAD WO CONTRAST  Result Date: 09/09/2020 CLINICAL DATA:  Slurred speech and facial droop which has resolved EXAM: MR HEAD WITHOUT CONTRAST MR CIRCLE OF WILLIS WITHOUT CONTRAST MRA OF THE NECK WITHOUT AND WITH CONTRAST TECHNIQUE: Multiplanar, multiecho pulse sequences of the brain, circle of willis and surrounding structures were obtained without intravenous contrast. Angiographic images of the neck were obtained using MRA technique without and with intravenous contrast. CONTRAST:  5.68mL GADAVIST GADOBUTROL 1 MMOL/ML IV SOLN COMPARISON:  Head CT from yesterday  FINDINGS: MR HEAD FINDINGS Brain: No acute infarction, hemorrhage, hydrocephalus, extra-axial collection or mass lesion. Small areas of prior cortical infarction in the left parietal, right occipital, and anterior left frontal lobes with superficial siderosis especially in the left frontal region. Confluent FLAIR hyperintensity in the cerebral white matter attributed to chronic small vessel ischemia. Small remote bilateral cerebellar infarctions. Vascular: See below Skull and upper cervical spine: Normal marrow signal. Retro dental ligamentous thickening touching the cervicomedullary junction without compression. Sinuses/Orbits: Bilateral nasal polyp. Bilateral cataract resection. MR CIRCLE OF WILLIS FINDINGS Symmetric carotid  arteries. Hypoplastic left A1 segment. No branch occlusion, beading, or proximal flow limiting stenosis. Strong dominance of the left vertebral artery with minimal right vertebral contribution to the basilar. Branch vessels are smooth and diffusely patent. MRA NECK FINDINGS Time-of-flight shows antegrade flow in the bilateral carotid and vertebral arteries. Mask series of the neck shows no significant incidental finding. Normal caliber arch with 3 vessel branching. Mild atheromatous plaque at the bilateral proximal carotid arteries without stenosis or ulceration. Proximal subclavian atheromatous change which is mild. The left vertebral artery is dominant. Both vertebral arteries are smooth and widely patent to the dura. IMPRESSION: Brain MRI: 1. No acute finding. 2. 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 Intracranial and neck MRA: Unremarkable for age.  No occlusion or flow limiting stenosis. Electronically Signed   By: Monte Fantasia M.D.   On: 09/09/2020 05:59   MR ANGIO NECK W WO CONTRAST  Result Date: 09/09/2020 CLINICAL DATA:  Slurred speech and facial droop which has resolved EXAM: MR HEAD WITHOUT CONTRAST MR CIRCLE OF WILLIS WITHOUT  CONTRAST MRA OF THE NECK WITHOUT AND WITH CONTRAST TECHNIQUE: Multiplanar, multiecho pulse sequences of the brain, circle of willis and surrounding structures were obtained without intravenous contrast. Angiographic images of the neck were obtained using MRA technique without and with intravenous contrast. CONTRAST:  5.80mL GADAVIST GADOBUTROL 1 MMOL/ML IV SOLN COMPARISON:  Head CT from yesterday FINDINGS: MR HEAD FINDINGS Brain: No acute infarction, hemorrhage, hydrocephalus, extra-axial collection or mass lesion. Small areas of prior cortical infarction in the left parietal, right occipital, and anterior left frontal lobes with superficial siderosis especially in the left frontal region. Confluent FLAIR hyperintensity in the cerebral white matter attributed to chronic small vessel ischemia. Small remote bilateral cerebellar infarctions. Vascular: See below Skull and upper cervical spine: Normal marrow signal. Retro dental ligamentous thickening touching the cervicomedullary junction without compression. Sinuses/Orbits: Bilateral nasal polyp. Bilateral cataract resection. MR CIRCLE OF WILLIS FINDINGS Symmetric carotid arteries. Hypoplastic left A1 segment. No branch occlusion, beading, or proximal flow limiting stenosis. Strong dominance of the left vertebral artery with minimal right vertebral contribution to the basilar. Branch vessels are smooth and diffusely patent. MRA NECK FINDINGS Time-of-flight shows antegrade flow in the bilateral carotid and vertebral arteries. Mask series of the neck shows no significant incidental finding. Normal caliber arch with 3 vessel branching. Mild atheromatous plaque at the bilateral proximal carotid arteries without stenosis or ulceration. Proximal subclavian atheromatous change which is mild. The left vertebral artery is dominant. Both vertebral arteries are smooth and widely patent to the dura. IMPRESSION: Brain MRI: 1. No acute finding. 2. Chronic ischemic injury including  remote cortical infarcts with superficial siderosis. Intracranial and neck MRA: Unremarkable for age.  No occlusion or flow limiting stenosis. Electronically Signed   By: Monte Fantasia M.D.   On: 09/09/2020 05:59   MR BRAIN WO CONTRAST  Result Date: 09/09/2020 CLINICAL DATA:  Slurred speech and facial droop which has resolved EXAM: MR HEAD WITHOUT CONTRAST MR CIRCLE OF WILLIS WITHOUT CONTRAST MRA OF THE NECK WITHOUT AND WITH CONTRAST TECHNIQUE: Multiplanar, multiecho pulse sequences of the brain, circle of willis and surrounding structures were obtained without intravenous contrast. Angiographic images of the neck were obtained using MRA technique without and with intravenous contrast. CONTRAST:  5.40mL GADAVIST GADOBUTROL 1 MMOL/ML IV SOLN COMPARISON:  Head CT from yesterday FINDINGS: MR HEAD FINDINGS Brain: No acute infarction, hemorrhage, hydrocephalus, extra-axial collection or mass lesion. Small areas of prior cortical infarction in the  left parietal, right occipital, and anterior left frontal lobes with superficial siderosis especially in the left frontal region. Confluent FLAIR hyperintensity in the cerebral white matter attributed to chronic small vessel ischemia. Small remote bilateral cerebellar infarctions. Vascular: See below Skull and upper cervical spine: Normal marrow signal. Retro dental ligamentous thickening touching the cervicomedullary junction without compression. Sinuses/Orbits: Bilateral nasal polyp. Bilateral cataract resection. MR CIRCLE OF WILLIS FINDINGS Symmetric carotid arteries. Hypoplastic left A1 segment. No branch occlusion, beading, or proximal flow limiting stenosis. Strong dominance of the left vertebral artery with minimal right vertebral contribution to the basilar. Branch vessels are smooth and diffusely patent. MRA NECK FINDINGS Time-of-flight shows antegrade flow in the bilateral carotid and vertebral arteries. Mask series of the neck shows no significant incidental  finding. Normal caliber arch with 3 vessel branching. Mild atheromatous plaque at the bilateral proximal carotid arteries without stenosis or ulceration. Proximal subclavian atheromatous change which is mild. The left vertebral artery is dominant. Both vertebral arteries are smooth and widely patent to the dura. IMPRESSION: Brain MRI: 1. No acute finding. 2. 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 Intracranial and neck MRA: Unremarkable for age.  No occlusion or flow limiting stenosis. Electronically Signed   By: Monte Fantasia M.D.   On: 09/09/2020 05:59   DG Chest Port 1 View  Result Date: 09/08/2020 CLINICAL DATA:  Stroke-like symptoms, aphasia, shortness of breath EXAM: PORTABLE CHEST 1 VIEW COMPARISON:  05/27/2020 FINDINGS: 2 frontal views of the chest demonstrate a stable cardiac silhouette. Diffuse emphysema with chronic areas of scarring are seen throughout the lungs, greatest in the right upper lobe. No acute airspace disease, effusion, or pneumothorax. No acute bony abnormalities. IMPRESSION: 1. Stable emphysema and scarring.  No acute airspace disease. Electronically Signed   By: Randa Ngo M.D.   On: 09/08/2020 21:40   ECHOCARDIOGRAM COMPLETE BUBBLE STUDY  Result Date: 09/09/2020    ECHOCARDIOGRAM REPORT   Patient Name:   John Black Date of Exam: 09/09/2020 Medical Rec #:  [id number]           Height:       65.0 in Accession #:    [id number]          Weight:       124.0 lb Date of Birth:  1929/08/25           BSA:          1.614 m Patient Age:    67 years            BP:           172/79 mmHg Patient Gender: M                   HR:           98 bpm. Exam Location:  Inpatient Procedure: 2D Echo, Cardiac Doppler, Color Doppler and Saline Contrast Bubble            Study Indications:    TIA  History:        Patient has prior history of Echocardiogram examinations, most                 recent 02/18/2020. Risk Factors:Former Smoker.   Sonographer:    Clayton Lefort RDCS (AE) Referring Phys: [id number] [place]  Sonographer Comments: Technically challenging study due to limited acoustic windows, Technically difficult study due to poor echo windows, suboptimal parasternal window and suboptimal apical window. Attempts at apical  images taken from subcostal area. IMPRESSIONS  1. Left ventricular ejection fraction, by estimation, is 50 to 55%. The left ventricle has low normal function. The left ventricle has no regional wall motion abnormalities. There is moderate left ventricular hypertrophy. Left ventricular diastolic parameters are consistent with Grade I diastolic dysfunction (impaired relaxation).  2. Right ventricular systolic function is normal. The right ventricular size is normal. There is normal pulmonary artery systolic pressure.  3. There is no evidence of cardiac tamponade.  4. The mitral valve is normal in structure. Trivial mitral valve regurgitation. No evidence of mitral stenosis.  5. The aortic valve is normal in structure. Aortic valve regurgitation is not visualized. No aortic stenosis is present.  6. Aortic dilatation noted. There is mild dilatation at the level of the sinuses of Valsalva, measuring 40 mm. There is mild dilatation of the ascending aorta, measuring 39 mm.  7. The inferior vena cava is normal in size with greater than 50% respiratory variability, suggesting right atrial pressure of 3 mmHg.  8. Agitated saline contrast bubble study was negative, with no evidence of any interatrial shunt. FINDINGS  Left Ventricle: Left ventricular ejection fraction, by estimation, is 50 to 55%. The left ventricle has low normal function. The left ventricle has no regional wall motion abnormalities. The left ventricular internal cavity size was normal in size. There is moderate left ventricular hypertrophy. Left ventricular diastolic parameters are consistent with Grade I diastolic dysfunction (impaired relaxation). Right Ventricle:  The right ventricular size is normal. No increase in right ventricular wall thickness. Right ventricular systolic function is normal. There is normal pulmonary artery systolic pressure. The tricuspid regurgitant velocity is 1.05 m/s, and  with an assumed right atrial pressure of 3 mmHg, the estimated right ventricular systolic pressure is 7.4 mmHg. Left Atrium: Left atrial size was normal in size. Right Atrium: Right atrial size was normal in size. Pericardium: Trivial pericardial effusion is present. There is no evidence of cardiac tamponade. Mitral Valve: The mitral valve is normal in structure. Trivial mitral valve regurgitation. No evidence of mitral valve stenosis. Tricuspid Valve: The tricuspid valve is normal in structure. Tricuspid valve regurgitation is trivial. No evidence of tricuspid stenosis. Aortic Valve: The aortic valve is normal in structure. Aortic valve regurgitation is not visualized. No aortic stenosis is present. Pulmonic Valve: The pulmonic valve was normal in structure. Pulmonic valve regurgitation is not visualized. No evidence of pulmonic stenosis. Aorta: Aortic dilatation noted. There is mild dilatation at the level of the sinuses of Valsalva, measuring 40 mm. There is mild dilatation of the ascending aorta, measuring 39 mm. Venous: The inferior vena cava is normal in size with greater than 50% respiratory variability, suggesting right atrial pressure of 3 mmHg. IAS/Shunts: No atrial level shunt detected by color flow Doppler. Agitated saline contrast was given intravenously to evaluate for intracardiac shunting. Agitated saline contrast bubble study was negative, with no evidence of any interatrial shunt. There  is no evidence of a patent foramen ovale. There is no evidence of an atrial septal defect.  LEFT VENTRICLE PLAX 2D LVIDd:         4.10 cm LVIDs:         3.30 cm LV PW:         1.20 cm LV IVS:        1.50 cm LVOT diam:     2.20 cm LVOT Area:     3.80 cm  IVC IVC diam: 1.60 cm LEFT  ATRIUM  Index LA diam:    4.20 cm 2.60 cm/m   AORTA Ao Root diam: 4.00 cm Ao Asc diam:  3.90 cm TRICUSPID VALVE TR Peak grad:   4.4 mmHg TR Vmax:        105.00 cm/s  SHUNTS Systemic Diam: 2.20 cm Skeet Latch MD Electronically signed by Skeet Latch MD Signature Date/Time: 09/09/2020/10:25:41 AM    Final       LAB RESULTS: Basic Metabolic Panel: Recent Labs  Lab 09/08/20 2100 09/09/20 0403  NA 142 140  K 4.2 4.1  CL 102 101  CO2 30 30  GLUCOSE 114* 150*  BUN 24* 21  CREATININE 0.94 0.93  CALCIUM 9.4 9.0   Liver Function Tests: Recent Labs  Lab 09/08/20 2100  AST 27  ALT 24  ALKPHOS 49  BILITOT 0.4  PROT 7.2  ALBUMIN 4.3   No results for input(s): LIPASE, AMYLASE in the last 168 hours. No results for input(s): AMMONIA in the last 168 hours. CBC: Recent Labs  Lab 09/08/20 2100  WBC 8.8  NEUTROABS 5.4  HGB 14.0  HCT 43.7  MCV 95.2  PLT 328   Cardiac Enzymes: No results for input(s): CKTOTAL, CKMB, CKMBINDEX, TROPONINI in the last 168 hours. BNP: Invalid input(s): POCBNP CBG: Recent Labs  Lab 09/09/20 1322  GLUCAP 137*       Disposition and Follow-up: Discharge Instructions    Diet - low sodium heart healthy   Complete by: As directed    Increase activity slowly   Complete by: As directed        DISPOSITION: Home with home health PT   [facility]    Phillips Odor, MD. Schedule an appointment as soon as possible for a visit in 2 week(s).   Specialty: Neurology Why: for TIA follow-up Contact information: [address] [place] [address] [phone]        Alycia Rossetti, MD. Schedule an appointment as soon as possible for a visit in 2 week(s).   Specialty: Family Medicine Contact information: [address] [place] [address] [phone]                Time coordinating discharge:  35-minute  Signed:   Estill Cotta M.D. [facility] 09/09/2020, 2:45 PM

## 2020-09-09 NOTE — Evaluation (Signed)
Physical Therapy Evaluation Patient Details Name: John Black [name]: [id number] DOB: 1929-07-05 Today's Date: 09/09/2020   History of Present Illness  86 y.o. male presenting with R facial droop, slurred speech and AMS. BP 185/97 and SpO2 86% in E.D. Patient admitted with TIA. PMHx significant for emphysema/chronic respiratory failure on 3L O2 via [place] at home, hypothyroidism, history of pneumonia, and prostate cancer.  Clinical Impression  Pt presents to PT with deficits in endurance and activity tolerance. Pt reports he is fatiguing more quickly than when at his baseline and has noted an increase in his RR over the past few days. Pt is able to ambulate household distances on 4L [place] with sats from 88-92% but is tachy up to 145 BPM with activity. Pt does recover quickly with seated rest breaks and reports he does not have to mobilize far in his home, typically limited to ~50' ambulation at a time by oxygen tubing. Pt will benefit from continued acute PT services while admitted and from HHPT services at the time of discharge to aide in improving activity tolerance.     Follow Up Recommendations Home health PT;Supervision - Intermittent    Equipment Recommendations  None recommended by PT    Recommendations for Other Services       Precautions / Restrictions Precautions Precautions: Fall Restrictions Weight Bearing Restrictions: No Other Position/Activity Restrictions: Monitor BP, HR, and SpO2. 4L O2 via [place] at baseline.      Mobility  Bed Mobility Overal bed mobility:  (not assessed, pt received and left sitting at the edge of bed)             General bed mobility comments: Patient seated EOB upon entry.    Transfers Overall transfer level: Needs assistance Equipment used: None;4-wheeled walker Transfers: Sit to/from Stand Sit to Stand: Supervision Stand pivot transfers: Min guard       General transfer comment: Min guard for sit to stand and stand pivot transfers  without AD.  Ambulation/Gait Ambulation/Gait assistance: Supervision Gait Distance (Feet): 125 Feet Assistive device: 4-wheeled walker Gait Pattern/deviations: Step-through pattern Gait velocity: functional Gait velocity interpretation: 1.31 - 2.62 ft/sec, indicative of limited community ambulator General Gait Details: pt with steady step through gait, no significant balance or gait deviations noted  Stairs            Wheelchair Mobility    Modified Rankin (Stroke Patients Only)       Balance Overall balance assessment: Mild deficits observed, not formally tested                                           Pertinent Vitals/Pain Pain Assessment: No/denies pain    Home Living Family/patient expects to be discharged to:: Private residence Living Arrangements: Spouse/significant other;Children;Other relatives Available Help at Discharge: Family;Available 24 hours/day Type of Home: House Home Access: Level entry     Home Layout: Multi-level Home Equipment: Walker - 2 wheels;Walker - 4 wheels;Cane - single point;Shower seat - built in;Hand held shower head;Grab bars - tub/shower;Transport chair      Prior Function Level of Independence: Needs assistance   Gait / Transfers Assistance Needed: pt ambulates independently in the home, utilizes rollator for community mobility  ADL's / Homemaking Assistance Needed: Occasional assist with ADLs on "bad" days. Typically patient is Mod I with bathing/dressing/toileting. Wears incontinence briefs and reports ~5 episodes of urinary incontinence daily.  Hand Dominance   Dominant Hand: Right    Extremity/Trunk Assessment   Upper Extremity Assessment Upper Extremity Assessment: Overall WFL for tasks assessed    Lower Extremity Assessment Lower Extremity Assessment: Overall WFL for tasks assessed    Cervical / Trunk Assessment Cervical / Trunk Assessment: Kyphotic  Communication   Communication:  HOH  Cognition Arousal/Alertness: Awake/alert Behavior During Therapy: WFL for tasks assessed/performed Overall Cognitive Status: Impaired/Different from baseline Area of Impairment: Memory                     Memory: Decreased short-term memory                General Comments General comments (skin integrity, edema, etc.): at rest HR in 110-120s, pt HR from 110-145 with ambulation. Pt on 4L  during session, SpO2 reads desat to 84% in standing to urinate however unreliable wavelength noted. SpO2 from 88-92% when ambulating.    Exercises     Assessment/Plan    PT Assessment Patient needs continued PT services  PT Problem List Decreased activity tolerance;Cardiopulmonary status limiting activity       PT Treatment Interventions Therapeutic activities;Therapeutic exercise;Gait training;Patient/family education    PT Goals (Current goals can be found in the Care Plan section)  Acute Rehab PT Goals Patient Stated Goal: to go home PT Goal Formulation: With patient Time For Goal Achievement: 09/23/20 Potential to Achieve Goals: Good Additional Goals Additional Goal #1: Pt will ambulate 25' with a BORG dyspnea rating of 3/10 or less    Frequency Min 3X/week   Barriers to discharge        Co-evaluation               AM-PAC PT "6 Clicks" Mobility  Outcome Measure Help needed turning from your back to your side while in a flat bed without using bedrails?: None Help needed moving from lying on your back to sitting on the side of a flat bed without using bedrails?: None Help needed moving to and from a bed to a chair (including a wheelchair)?: A Little Help needed standing up from a chair using your arms (e.g., wheelchair or bedside chair)?: A Little Help needed to walk in hospital room?: A Little Help needed climbing 3-5 steps with a railing? : A Lot 6 Click Score: 19    End of Session Equipment Utilized During Treatment: Oxygen Activity Tolerance:  Patient tolerated treatment well Patient left: in bed;with call bell/phone within reach;with bed alarm set Nurse Communication: Mobility status PT Visit Diagnosis: Other (comment) (cardiopulmonary endurance)    Time: 9390-3009 PT Time Calculation (min) (ACUTE ONLY): 28 min   Charges:   PT Evaluation $PT Eval Low Complexity: [place], PT, DPT Acute Rehabilitation Pager: [phone]   Zenaida Niece 09/09/2020, 9:17 AM

## 2020-09-09 NOTE — Plan of Care (Signed)
  Problem: Education: Goal: Knowledge of secondary prevention will improve Outcome: Adequate for Discharge Goal: Knowledge of patient specific risk factors addressed and post discharge goals established will improve Outcome: Adequate for Discharge   

## 2020-09-09 NOTE — ED Notes (Signed)
Pt up to chair waiting for transport. VSS. 4LNC humidified in place, pt denies pain. Remains on cardiac and respiratory monitoring, denies needs at this time.

## 2020-09-09 NOTE — Progress Notes (Signed)
Patient was a yellow mews score.  Pt/ OT was working with patient and then patient had a bubble study.  Will docuement new vital signs.as soon as Bubble study is complete

## 2020-09-09 NOTE — Procedures (Signed)
Echo attempted. Patient eating breakfast. Will attempt again later. 

## 2020-09-21 ENCOUNTER — Ambulatory Visit (INDEPENDENT_AMBULATORY_CARE_PROVIDER_SITE_OTHER): Payer: PPO | Admitting: Family Medicine

## 2020-09-21 ENCOUNTER — Encounter: Payer: Self-pay | Admitting: Family Medicine

## 2020-09-21 ENCOUNTER — Other Ambulatory Visit: Payer: Self-pay

## 2020-09-21 VITALS — BP 102/64 | HR 112 | Temp 98.1°F | Resp 20 | Wt 124.6 lb

## 2020-09-21 DIAGNOSIS — E782 Mixed hyperlipidemia: Secondary | ICD-10-CM | POA: Diagnosis not present

## 2020-09-21 DIAGNOSIS — Z8673 Personal history of transient ischemic attack (TIA), and cerebral infarction without residual deficits: Secondary | ICD-10-CM | POA: Diagnosis not present

## 2020-09-21 DIAGNOSIS — J449 Chronic obstructive pulmonary disease, unspecified: Secondary | ICD-10-CM | POA: Diagnosis not present

## 2020-09-21 DIAGNOSIS — R531 Weakness: Secondary | ICD-10-CM | POA: Diagnosis not present

## 2020-09-21 DIAGNOSIS — E440 Moderate protein-calorie malnutrition: Secondary | ICD-10-CM

## 2020-09-21 MED ORDER — MIRTAZAPINE 15 MG PO TABS: ORAL_TABLET | ORAL | 2 refills | Status: DC

## 2020-09-21 NOTE — Patient Instructions (Signed)
Referral to Neurology  Referral to PT/OT with [facility] Stop the zetia  F/U as previous

## 2020-09-21 NOTE — Progress Notes (Signed)
Subjective:    Patient ID: John Black, male    DOB: Jul 26, 1929, 87 y.o.   [name]: [id number]  Patient presents for Hospitalization Follow-up  Patient here for hospital follow-up.  He was admitted to the hospital secondary to concern for cerebrovascular accident.  He was sent to the emergency room after he was noted to have some altered mental status.  His wife noted that he was having right-sided facial droop and slurred speech and he felt like he had difficulty talking.  EMS was called.  In the ER his blood pressure was significantly elevated 185/97 his sats were 86% on his 3 L.  He was given bronchodilator along with magnesium sulfate statin and aspirin.  His labs were fairly unremarkable.  His Covid testing and influenza were negative.  Chest x-ray showed stable emphysema but no acute abnormalities.  He had a CT of head done which did not show anything acute there were small infarcts in the left frontal left parietal and occipital but these appear to be chronic.  His symptoms resolved fairly quickly approx 30 minutes was back to his baseline.  He did have an MRI done which showed the same chronic lesions. 2D echo was performed he had ejection fraction of 50 to 55%  Lipid panel did show an LDL of 140 but we did not tolerate statins in the past , he was started on Zetia 10 mg He was advised to follow-up with neurology as an outpatient and he was started on home health PT  Reviewed hospital labs info with pt and family    Review Of Systems:  GEN- denies fatigue, fever, weight loss,+weakness, recent illness HEENT- denies eye drainage, change in vision, nasal discharge, CVS- denies chest pain, palpitations RESP- +SOB, cough, wheeze ABD- denies N/V, change in stools, abd pain GU- denies dysuria, hematuria, dribbling, incontinence MSK- denies joint pain, muscle aches, injury Neuro- denies headache, dizziness, syncope, seizure activity       Objective:    BP 102/64   Pulse (!) 112    Temp 98.1 F (36.7 C) (Temporal)   Resp 20   Wt 124 lb 9.6 oz (56.5 kg)   SpO2 90% Comment: 4L/MIN VIA [place]  BMI 20.73 kg/m  GEN- NAD, alert and oriented x3,sitting in wheelchair , chronically ill appearing  HEENT- PERRL, EOMI, non injected sclera, pink conjunctiva,MMM TM in tact, canals clear  CVS- mild Tachycardia  Hr 106 , distant HS, no murmur  RESP-clear  no wheeze, 4L oxygen Neuro-CNII-XII in tact, moving all 4 ext, able to stand with assistance, speech baseline  ABD-NABS,soft, nt,nd,  Ext- no edemA       Assessment & Plan:      Problem List Items Addressed This Visit      Unprioritized   COPD GOLD 3/ group D  02 dep    COPD back to baseline Oxygen sat 88-90% on 3-4L Chronic prednisone A1C borderline elevated at  5.7%      Hyperlipidemia    Patient with recent transient ischemic attack.  He also showed evidence of some chronic infarcts.  He has multiple comorbidities with severe end-stage COPD oxygen and prednisone dependent I do not see any benefit in keeping him on Zetia to lower LDL levels at his age and with his comorbidities. I Do NOT think there is any beneficial cardiovascular risk reduction at this time with zetia.  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  After discussion of my concerns  and patient actually does not want to take any other medications we will discontinue the Zetia.  He will have 1 consult with the neurologist they would like to go over MRI results and what to possibly expect in the future.  We will get him set up with physical therapy and Occupational Therapy to try to get him back to his baseline with strength and mobility within the home.  His blood pressure is back to baseline he would not tolerate blood pressure medication because of the lower blood pressure and the increased risk of syncope dizzy spells      Protein calorie malnutrition ([place])    He is maintaining weight and it helps with sleep and mood No change  to remeron       Relevant Orders   Ambulatory referral to [place]    Other Visit Diagnoses    History of transient ischemic attack (TIA)    -  Primary   Relevant Orders   Ambulatory referral to Neurology   Ambulatory referral to [place] weakness       Relevant Orders   Ambulatory referral to Neurology   Ambulatory referral to Home Health      Note: This dictation was prepared with Dragon dictation along with smaller phrase technology. Any transcriptional errors that result from this process are unintentional.

## 2020-09-21 NOTE — Assessment & Plan Note (Addendum)
Patient with recent transient ischemic attack.  He also showed evidence of some chronic infarcts.  He has multiple comorbidities with severe end-stage COPD oxygen and prednisone dependent I do not see any benefit in keeping him on Zetia to lower LDL levels at his age and with his comorbidities. I Do NOT think there is any beneficial cardiovascular risk reduction at this time with zetia.  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  After discussion of my concerns and patient actually does not want to take any other medications we will discontinue the Zetia.  He will have 1 consult with the neurologist they would like to go over MRI results and what to possibly expect in the future.  We will get him set up with physical therapy and Occupational Therapy to try to get him back to his baseline with strength and mobility within the home.  His blood pressure is back to baseline he would not tolerate blood pressure medication because of the lower blood pressure and the increased risk of syncope dizzy spells

## 2020-09-21 NOTE — Assessment & Plan Note (Signed)
COPD back to baseline Oxygen sat 88-90% on 3-4L Chronic prednisone A1C borderline elevated at  5.7%

## 2020-09-21 NOTE — Assessment & Plan Note (Signed)
He is maintaining weight and it helps with sleep and mood No change to remeron

## 2020-09-23 DIAGNOSIS — R2681 Unsteadiness on feet: Secondary | ICD-10-CM | POA: Diagnosis not present

## 2020-09-23 DIAGNOSIS — J9611 Chronic respiratory failure with hypoxia: Secondary | ICD-10-CM | POA: Diagnosis not present

## 2020-09-23 DIAGNOSIS — J439 Emphysema, unspecified: Secondary | ICD-10-CM | POA: Diagnosis not present

## 2020-09-30 ENCOUNTER — Telehealth: Payer: Self-pay | Admitting: Internal Medicine

## 2020-09-30 MED ORDER — TRELEGY ELLIPTA 100-62.5-25 MCG/INH IN AEPB: 1.0000 | INHALATION_SPRAY | Freq: Every day | RESPIRATORY_TRACT | 11 refills | Status: DC

## 2020-09-30 NOTE — Telephone Encounter (Signed)
rx sent to preferred pharmacy. Pt aware.  Nothing further needed at this time- will close encounter.   

## 2020-10-05 ENCOUNTER — Telehealth: Payer: Self-pay | Admitting: *Deleted

## 2020-10-05 DIAGNOSIS — Z8673 Personal history of transient ischemic attack (TIA), and cerebral infarction without residual deficits: Secondary | ICD-10-CM

## 2020-10-05 DIAGNOSIS — R531 Weakness: Secondary | ICD-10-CM

## 2020-10-05 NOTE — Telephone Encounter (Signed)
Received call from patient.   Reports that he and spouse are unsure if they would like to proceed with referral to Neurology to Dr. Merlene Laughter.   States that patient is having no Sx at this time. States that he is hesitant to see Dr. Merlene Laughter as he is mainly pain management. Inquired as to if they should even keep referral at this time.   Advised that initial appointment with neurology is more getting to know him, so if Sx began again, they would have plan in place. Advised that it is patient prerogative as to how to proceed.   Advised that referral can be placed to another Neurologist in [place]. Reports that telehealth visit was completed in hospital with neurologist that they both liked, but are unable to remember name. No chart notes placed by neurology.   Please advise.

## 2020-10-07 NOTE — Telephone Encounter (Signed)
Call placed to patient and patient made aware.  

## 2020-10-07 NOTE — Telephone Encounter (Signed)
New referral placed to [facility] in [place] at family request

## 2020-10-09 ENCOUNTER — Other Ambulatory Visit: Payer: Self-pay

## 2020-10-09 ENCOUNTER — Inpatient Hospital Stay (HOSPITAL_COMMUNITY): Payer: PPO

## 2020-10-09 ENCOUNTER — Inpatient Hospital Stay (HOSPITAL_COMMUNITY)
Admission: EM | Admit: 2020-10-09 | Discharge: 2020-10-12 | DRG: 522 | Disposition: A | Discharge status: Skilled Nursing Facility | Payer: PPO | Attending: Internal Medicine | Admitting: Internal Medicine

## 2020-10-09 ENCOUNTER — Emergency Department (HOSPITAL_COMMUNITY): Payer: PPO

## 2020-10-09 ENCOUNTER — Encounter (HOSPITAL_COMMUNITY): Payer: Self-pay

## 2020-10-09 DIAGNOSIS — R0902 Hypoxemia: Secondary | ICD-10-CM | POA: Diagnosis not present

## 2020-10-09 DIAGNOSIS — Z8701 Personal history of pneumonia (recurrent): Secondary | ICD-10-CM | POA: Diagnosis not present

## 2020-10-09 DIAGNOSIS — F32A Depression, unspecified: Secondary | ICD-10-CM | POA: Diagnosis not present

## 2020-10-09 DIAGNOSIS — W19XXXA Unspecified fall, initial encounter: Secondary | ICD-10-CM

## 2020-10-09 DIAGNOSIS — M19011 Primary osteoarthritis, right shoulder: Secondary | ICD-10-CM | POA: Diagnosis not present

## 2020-10-09 DIAGNOSIS — E559 Vitamin D deficiency, unspecified: Secondary | ICD-10-CM | POA: Diagnosis not present

## 2020-10-09 DIAGNOSIS — R262 Difficulty in walking, not elsewhere classified: Secondary | ICD-10-CM | POA: Diagnosis not present

## 2020-10-09 DIAGNOSIS — J9611 Chronic respiratory failure with hypoxia: Secondary | ICD-10-CM | POA: Diagnosis not present

## 2020-10-09 DIAGNOSIS — S72001A Fracture of unspecified part of neck of right femur, initial encounter for closed fracture: Principal | ICD-10-CM | POA: Diagnosis present

## 2020-10-09 DIAGNOSIS — W1830XA Fall on same level, unspecified, initial encounter: Secondary | ICD-10-CM | POA: Diagnosis present

## 2020-10-09 DIAGNOSIS — W19XXXD Unspecified fall, subsequent encounter: Secondary | ICD-10-CM

## 2020-10-09 DIAGNOSIS — R55 Syncope and collapse: Secondary | ICD-10-CM | POA: Diagnosis not present

## 2020-10-09 DIAGNOSIS — Z01818 Encounter for other preprocedural examination: Secondary | ICD-10-CM | POA: Diagnosis not present

## 2020-10-09 DIAGNOSIS — Z9981 Dependence on supplemental oxygen: Secondary | ICD-10-CM | POA: Diagnosis not present

## 2020-10-09 DIAGNOSIS — Z79899 Other long term (current) drug therapy: Secondary | ICD-10-CM | POA: Diagnosis not present

## 2020-10-09 DIAGNOSIS — J439 Emphysema, unspecified: Secondary | ICD-10-CM | POA: Diagnosis present

## 2020-10-09 DIAGNOSIS — Z8546 Personal history of malignant neoplasm of prostate: Secondary | ICD-10-CM | POA: Diagnosis not present

## 2020-10-09 DIAGNOSIS — F419 Anxiety disorder, unspecified: Secondary | ICD-10-CM | POA: Diagnosis not present

## 2020-10-09 DIAGNOSIS — F411 Generalized anxiety disorder: Secondary | ICD-10-CM | POA: Diagnosis not present

## 2020-10-09 DIAGNOSIS — Z7982 Long term (current) use of aspirin: Secondary | ICD-10-CM

## 2020-10-09 DIAGNOSIS — K219 Gastro-esophageal reflux disease without esophagitis: Secondary | ICD-10-CM | POA: Diagnosis present

## 2020-10-09 DIAGNOSIS — Z7952 Long term (current) use of systemic steroids: Secondary | ICD-10-CM

## 2020-10-09 DIAGNOSIS — Z96641 Presence of right artificial hip joint: Secondary | ICD-10-CM | POA: Diagnosis not present

## 2020-10-09 DIAGNOSIS — D62 Acute posthemorrhagic anemia: Secondary | ICD-10-CM | POA: Diagnosis not present

## 2020-10-09 DIAGNOSIS — K4090 Unilateral inguinal hernia, without obstruction or gangrene, not specified as recurrent: Secondary | ICD-10-CM | POA: Diagnosis not present

## 2020-10-09 DIAGNOSIS — N400 Enlarged prostate without lower urinary tract symptoms: Secondary | ICD-10-CM | POA: Diagnosis present

## 2020-10-09 DIAGNOSIS — R000 Tachycardia, unspecified: Secondary | ICD-10-CM | POA: Diagnosis not present

## 2020-10-09 DIAGNOSIS — R52 Pain, unspecified: Secondary | ICD-10-CM | POA: Diagnosis not present

## 2020-10-09 DIAGNOSIS — F418 Other specified anxiety disorders: Secondary | ICD-10-CM | POA: Diagnosis not present

## 2020-10-09 DIAGNOSIS — I700 Atherosclerosis of aorta: Secondary | ICD-10-CM | POA: Diagnosis not present

## 2020-10-09 DIAGNOSIS — Z20822 Contact with and (suspected) exposure to covid-19: Secondary | ICD-10-CM | POA: Diagnosis not present

## 2020-10-09 DIAGNOSIS — R1310 Dysphagia, unspecified: Secondary | ICD-10-CM | POA: Diagnosis not present

## 2020-10-09 DIAGNOSIS — F329 Major depressive disorder, single episode, unspecified: Secondary | ICD-10-CM | POA: Diagnosis not present

## 2020-10-09 DIAGNOSIS — S72001D Fracture of unspecified part of neck of right femur, subsequent encounter for closed fracture with routine healing: Secondary | ICD-10-CM | POA: Diagnosis not present

## 2020-10-09 DIAGNOSIS — I959 Hypotension, unspecified: Secondary | ICD-10-CM | POA: Diagnosis not present

## 2020-10-09 DIAGNOSIS — E039 Hypothyroidism, unspecified: Secondary | ICD-10-CM | POA: Diagnosis present

## 2020-10-09 DIAGNOSIS — J449 Chronic obstructive pulmonary disease, unspecified: Secondary | ICD-10-CM | POA: Diagnosis not present

## 2020-10-09 DIAGNOSIS — Z79890 Hormone replacement therapy (postmenopausal): Secondary | ICD-10-CM | POA: Diagnosis not present

## 2020-10-09 DIAGNOSIS — J984 Other disorders of lung: Secondary | ICD-10-CM | POA: Diagnosis not present

## 2020-10-09 DIAGNOSIS — Z471 Aftercare following joint replacement surgery: Secondary | ICD-10-CM | POA: Diagnosis not present

## 2020-10-09 DIAGNOSIS — Z87891 Personal history of nicotine dependence: Secondary | ICD-10-CM | POA: Diagnosis not present

## 2020-10-09 DIAGNOSIS — M6281 Muscle weakness (generalized): Secondary | ICD-10-CM | POA: Diagnosis not present

## 2020-10-09 DIAGNOSIS — H04123 Dry eye syndrome of bilateral lacrimal glands: Secondary | ICD-10-CM | POA: Diagnosis not present

## 2020-10-09 DIAGNOSIS — G4700 Insomnia, unspecified: Secondary | ICD-10-CM | POA: Diagnosis not present

## 2020-10-09 DIAGNOSIS — M79601 Pain in right arm: Secondary | ICD-10-CM | POA: Diagnosis not present

## 2020-10-09 DIAGNOSIS — E440 Moderate protein-calorie malnutrition: Secondary | ICD-10-CM | POA: Diagnosis not present

## 2020-10-09 DIAGNOSIS — Z885 Allergy status to narcotic agent status: Secondary | ICD-10-CM

## 2020-10-09 DIAGNOSIS — M25551 Pain in right hip: Secondary | ICD-10-CM | POA: Diagnosis not present

## 2020-10-09 DIAGNOSIS — Z9181 History of falling: Secondary | ICD-10-CM | POA: Diagnosis not present

## 2020-10-09 LAB — CBC WITH DIFFERENTIAL/PLATELET
Abs Immature Granulocytes: 0.18 10*3/uL — ABNORMAL HIGH (ref 0.00–0.07)
Basophils Absolute: 0 10*3/uL (ref 0.0–0.1)
Basophils Relative: 0 %
Eosinophils Absolute: 0.1 10*3/uL (ref 0.0–0.5)
Eosinophils Relative: 0 %
HCT: 40.7 % (ref 39.0–52.0)
Hemoglobin: 12.9 g/dL — ABNORMAL LOW (ref 13.0–17.0)
Immature Granulocytes: 1 %
Lymphocytes Relative: 6 %
Lymphs Abs: 0.7 10*3/uL (ref 0.7–4.0)
MCH: 30 pg (ref 26.0–34.0)
MCHC: 31.7 g/dL (ref 30.0–36.0)
MCV: 94.7 fL (ref 80.0–100.0)
Monocytes Absolute: 1.2 10*3/uL — ABNORMAL HIGH (ref 0.1–1.0)
Monocytes Relative: 9 %
Neutro Abs: 10.8 10*3/uL — ABNORMAL HIGH (ref 1.7–7.7)
Neutrophils Relative %: 84 %
Platelets: 286 10*3/uL (ref 150–400)
RBC: 4.3 MIL/uL (ref 4.22–5.81)
RDW: 14.1 % (ref 11.5–15.5)
WBC: 13 10*3/uL — ABNORMAL HIGH (ref 4.0–10.5)
nRBC: 0 % (ref 0.0–0.2)

## 2020-10-09 LAB — BASIC METABOLIC PANEL
Anion gap: 8 (ref 5–15)
BUN: 25 mg/dL — ABNORMAL HIGH (ref 8–23)
CO2: 30 mmol/L (ref 22–32)
Calcium: 9.1 mg/dL (ref 8.9–10.3)
Chloride: 97 mmol/L — ABNORMAL LOW (ref 98–111)
Creatinine, Ser: 0.85 mg/dL (ref 0.61–1.24)
GFR, Estimated: >60 mL/min (ref >60)
Glucose, Bld: 115 mg/dL — ABNORMAL HIGH (ref 70–99)
Potassium: 3.9 mmol/L (ref 3.5–5.1)
Sodium: 135 mmol/L (ref 135–145)

## 2020-10-09 LAB — TYPE AND SCREEN
ABO/RH(D): A NEG
Antibody Screen: NEGATIVE

## 2020-10-09 LAB — PROTIME-INR
INR: 1 (ref 0.8–1.2)
Prothrombin Time: 12.8 seconds (ref 11.4–15.2)

## 2020-10-09 LAB — VITAMIN D 25 HYDROXY (VIT D DEFICIENCY, FRACTURES): Vit D, 25-Hydroxy: 28.37 ng/mL — ABNORMAL LOW (ref 30–100)

## 2020-10-09 LAB — SARS CORONAVIRUS 2 (TAT 6-24 HRS): SARS Coronavirus 2: NEGATIVE

## 2020-10-09 IMAGING — DX DG HIP (WITH OR WITHOUT PELVIS) 2-3V*R*
3 series · 3 of 3 positions shown · non-contrast
Comparison: CT abdomen pelvis [DATE]

CLINICAL DATA: Status post fall.  Right hip pain.

EXAM:
DG HIP (WITH OR WITHOUT PELVIS) 2-3V RIGHT

[hip ap]
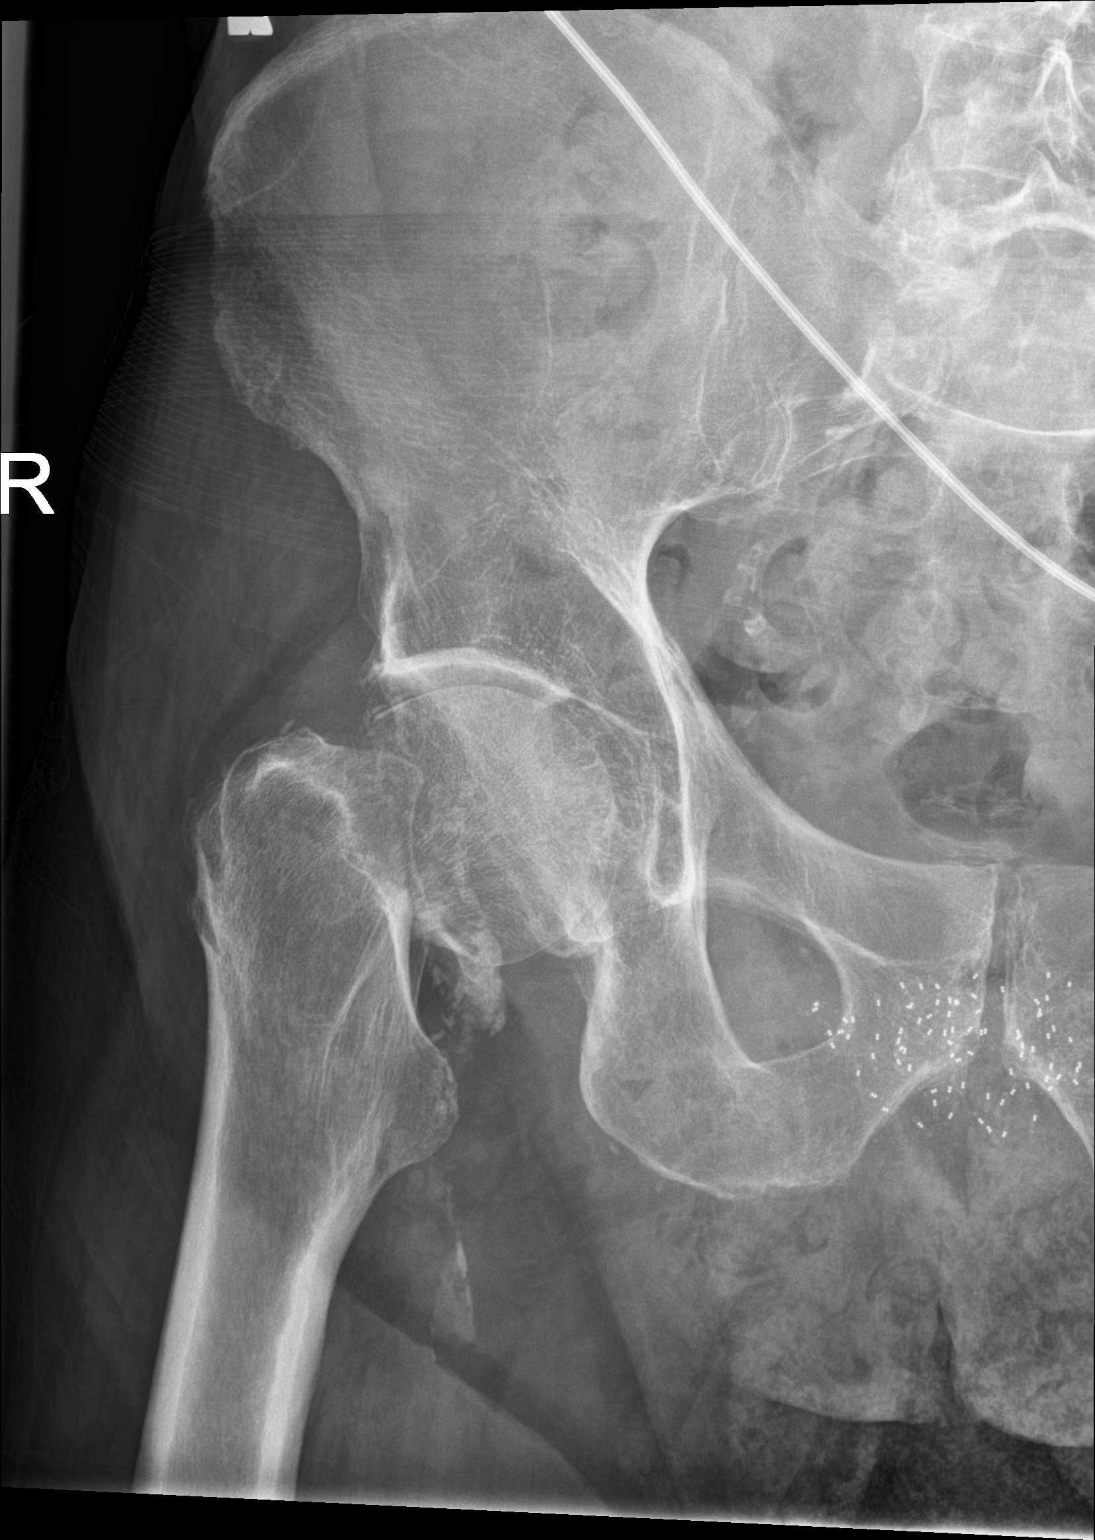

[hip lat]
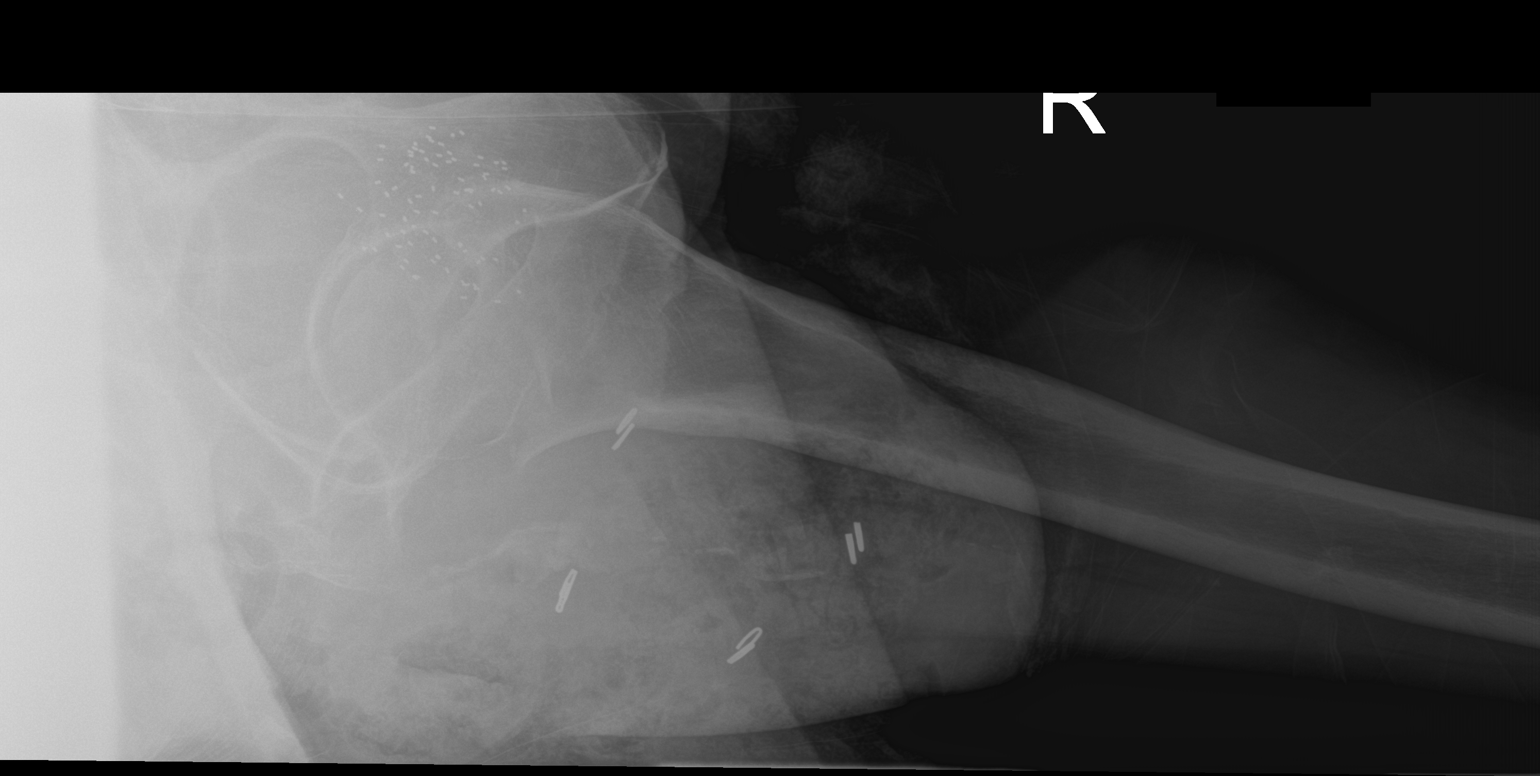

[pelvis ap]
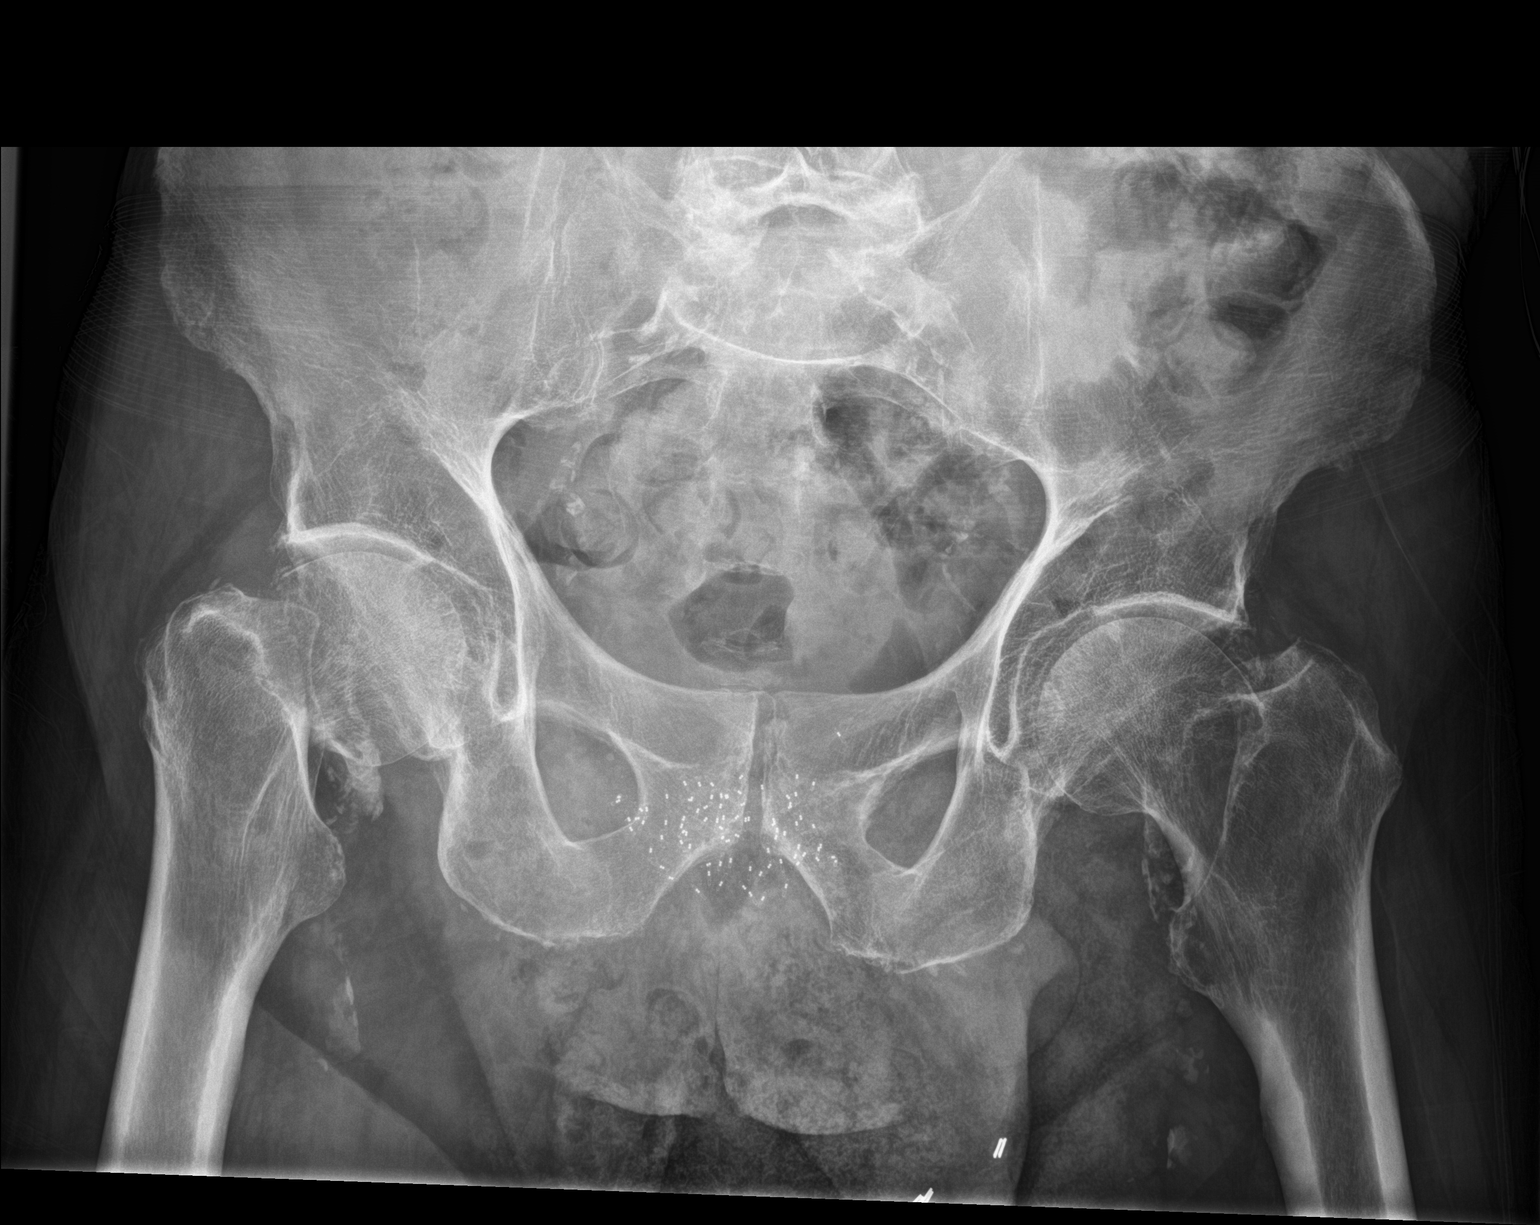

[3 of 3 positions shown; findings below may reference images not displayed]

FINDINGS: There is a foreshortened fracture through the right femoral neck.
Mild right hip joint degenerative changes. Vascular calcifications.
IMPRESSION: Right femoral neck fracture.

## 2020-10-09 IMAGING — DX DG SHOULDER 2+V*R*
3 series · 3 of 3 positions shown · non-contrast
Comparison: Chest x-ray [DATE]

CLINICAL DATA: Status post fall with right arm pain.

EXAM:
RIGHT SHOULDER - 2+ VIEW

[shoulder axial]
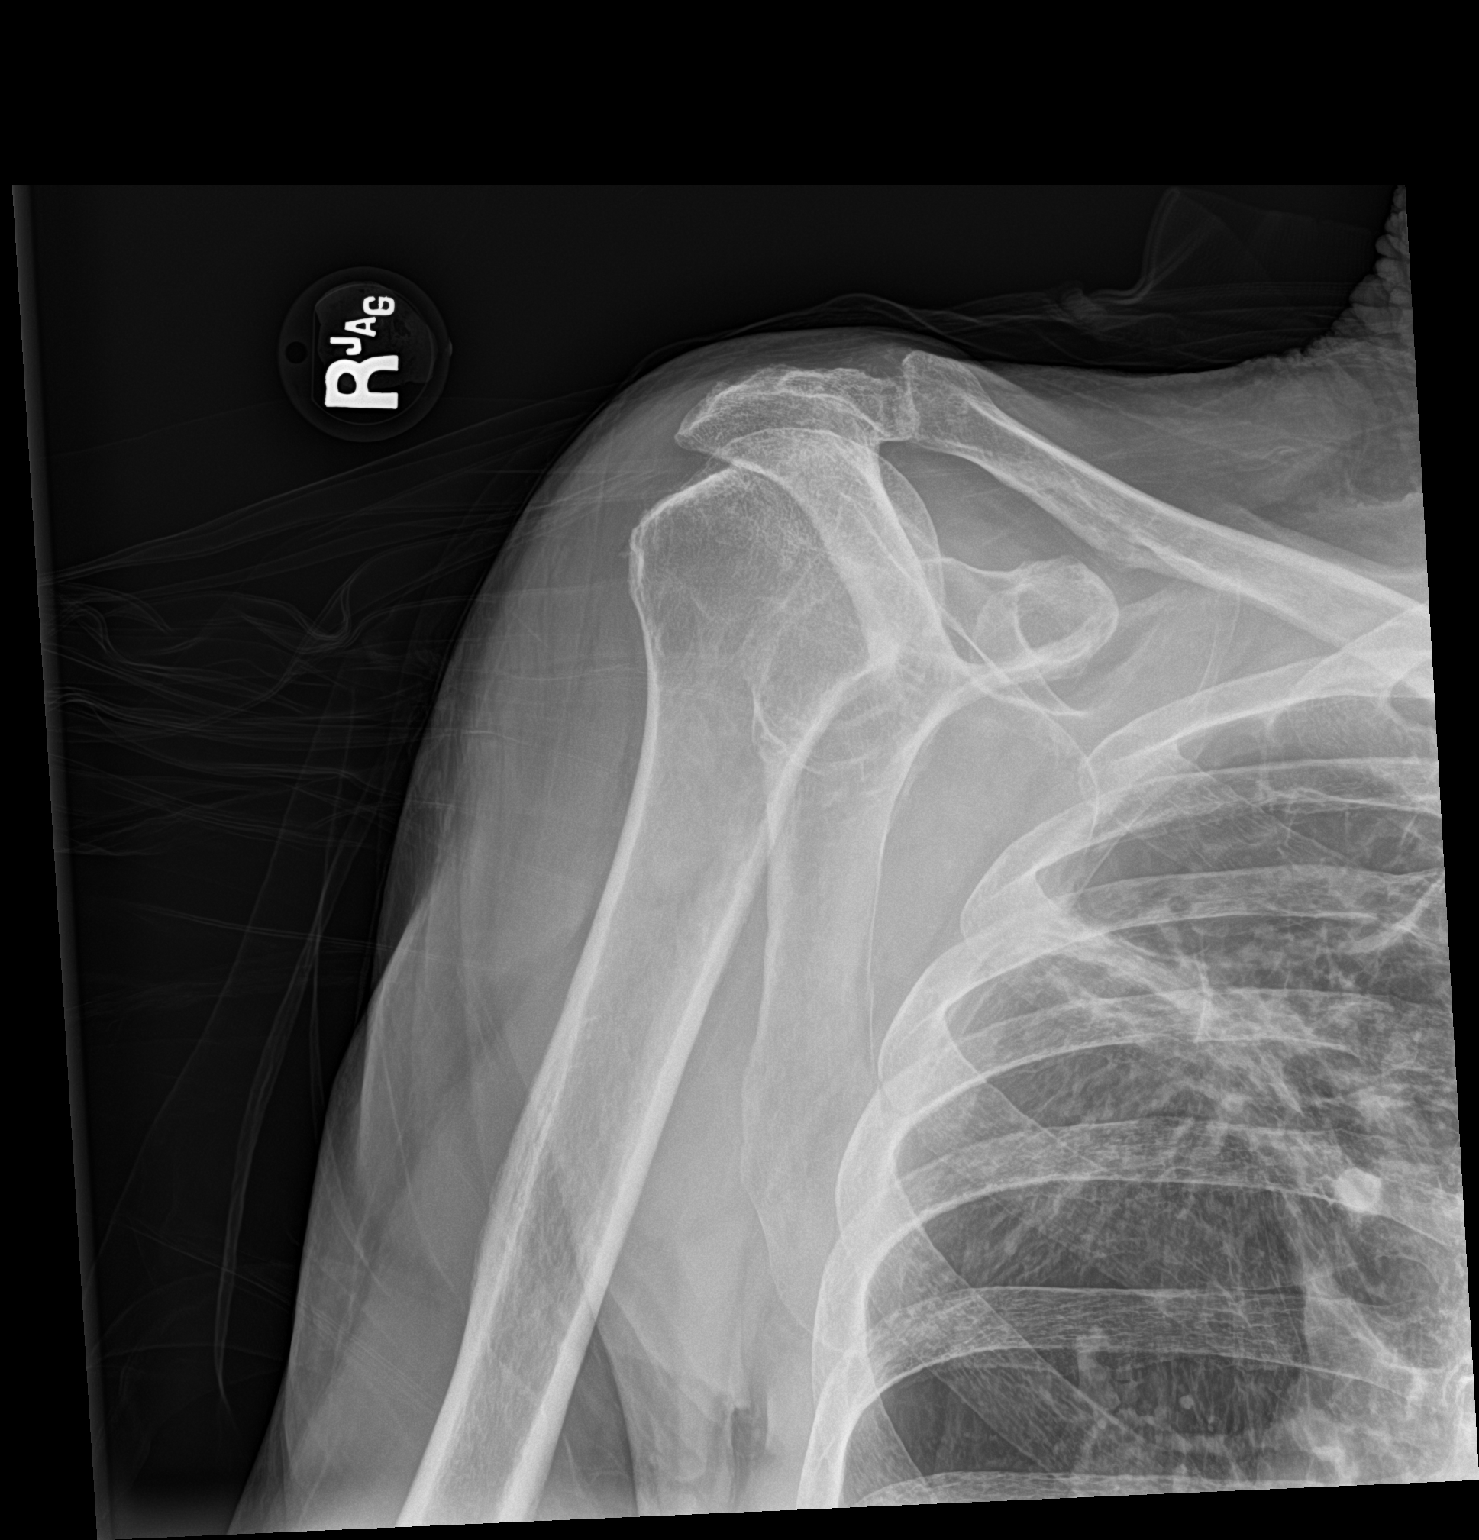

[shoulder ap]
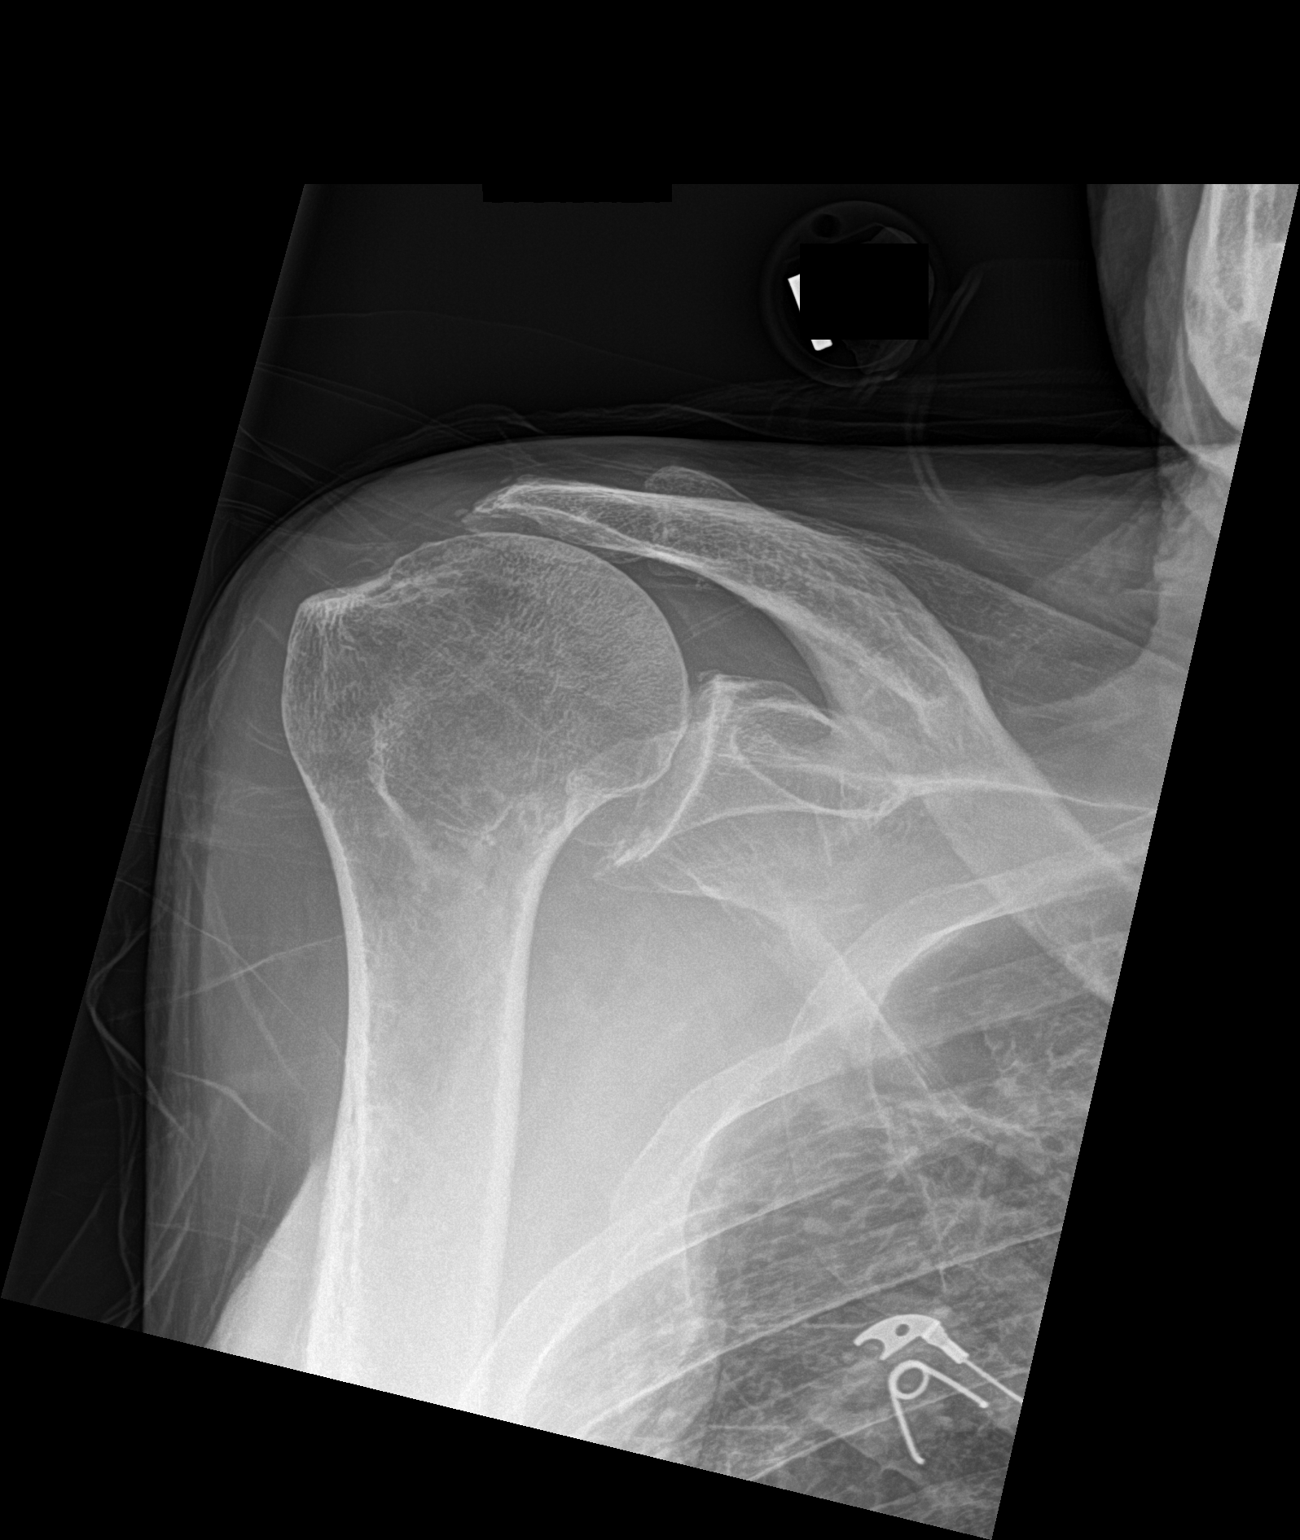

[shoulder obl]
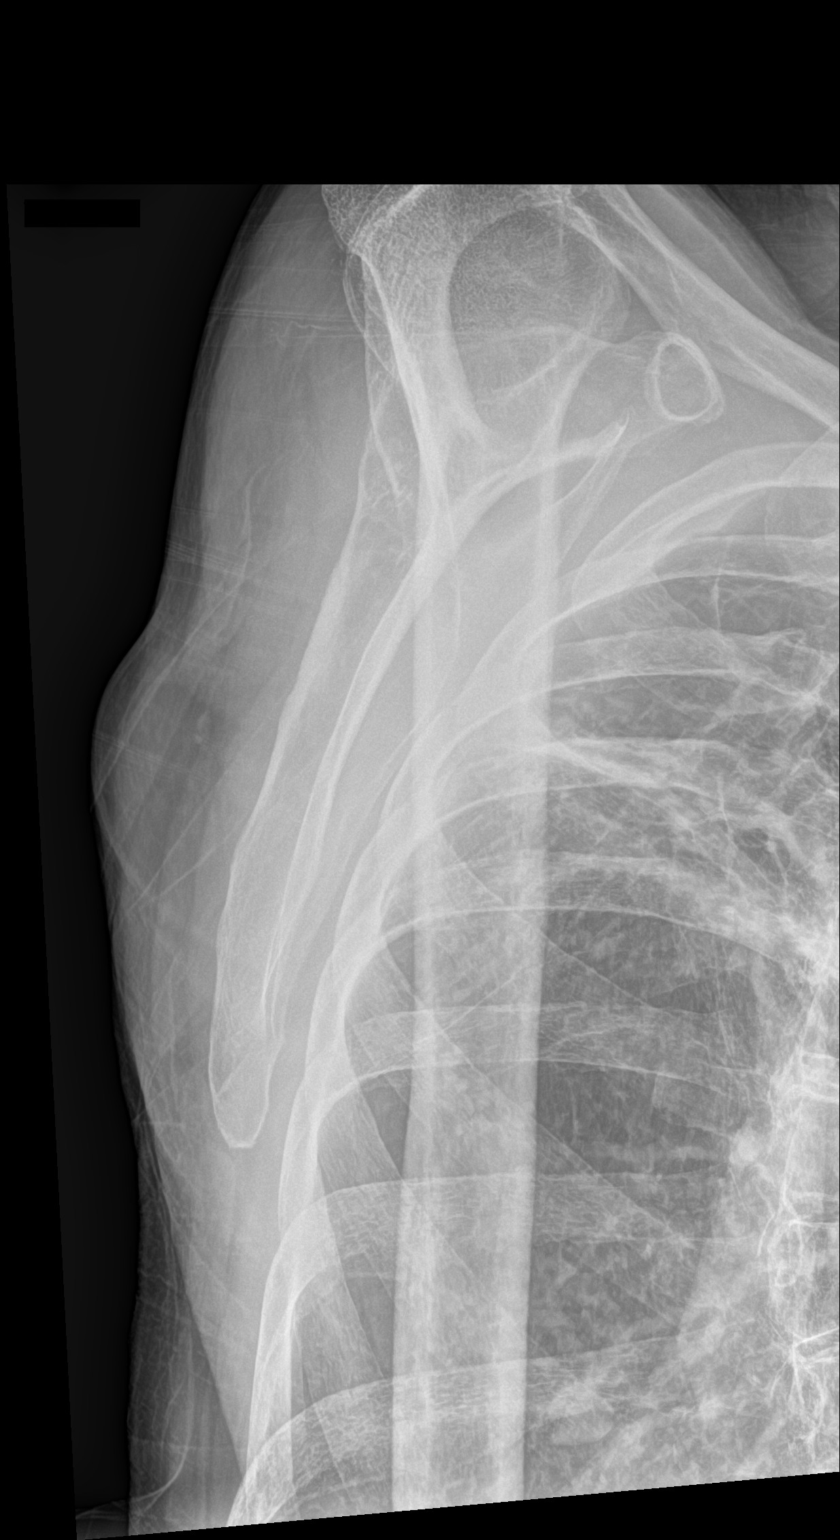

[3 of 3 positions shown; findings below may reference images not displayed]

FINDINGS: There is no evidence of fracture or dislocation. Degenerative joint
changes of the right shoulder identified. Chronic scar of the right
apex is noted.
IMPRESSION: No acute fracture or dislocation.

## 2020-10-09 IMAGING — DX DG CHEST 1V PORT
2 series · 2 of 2 positions shown · non-contrast
Comparison: [DATE]

CLINICAL DATA: Preop for surgery.

EXAM:
PORTABLE CHEST 1 VIEW

[chest ap (1 of 2)]
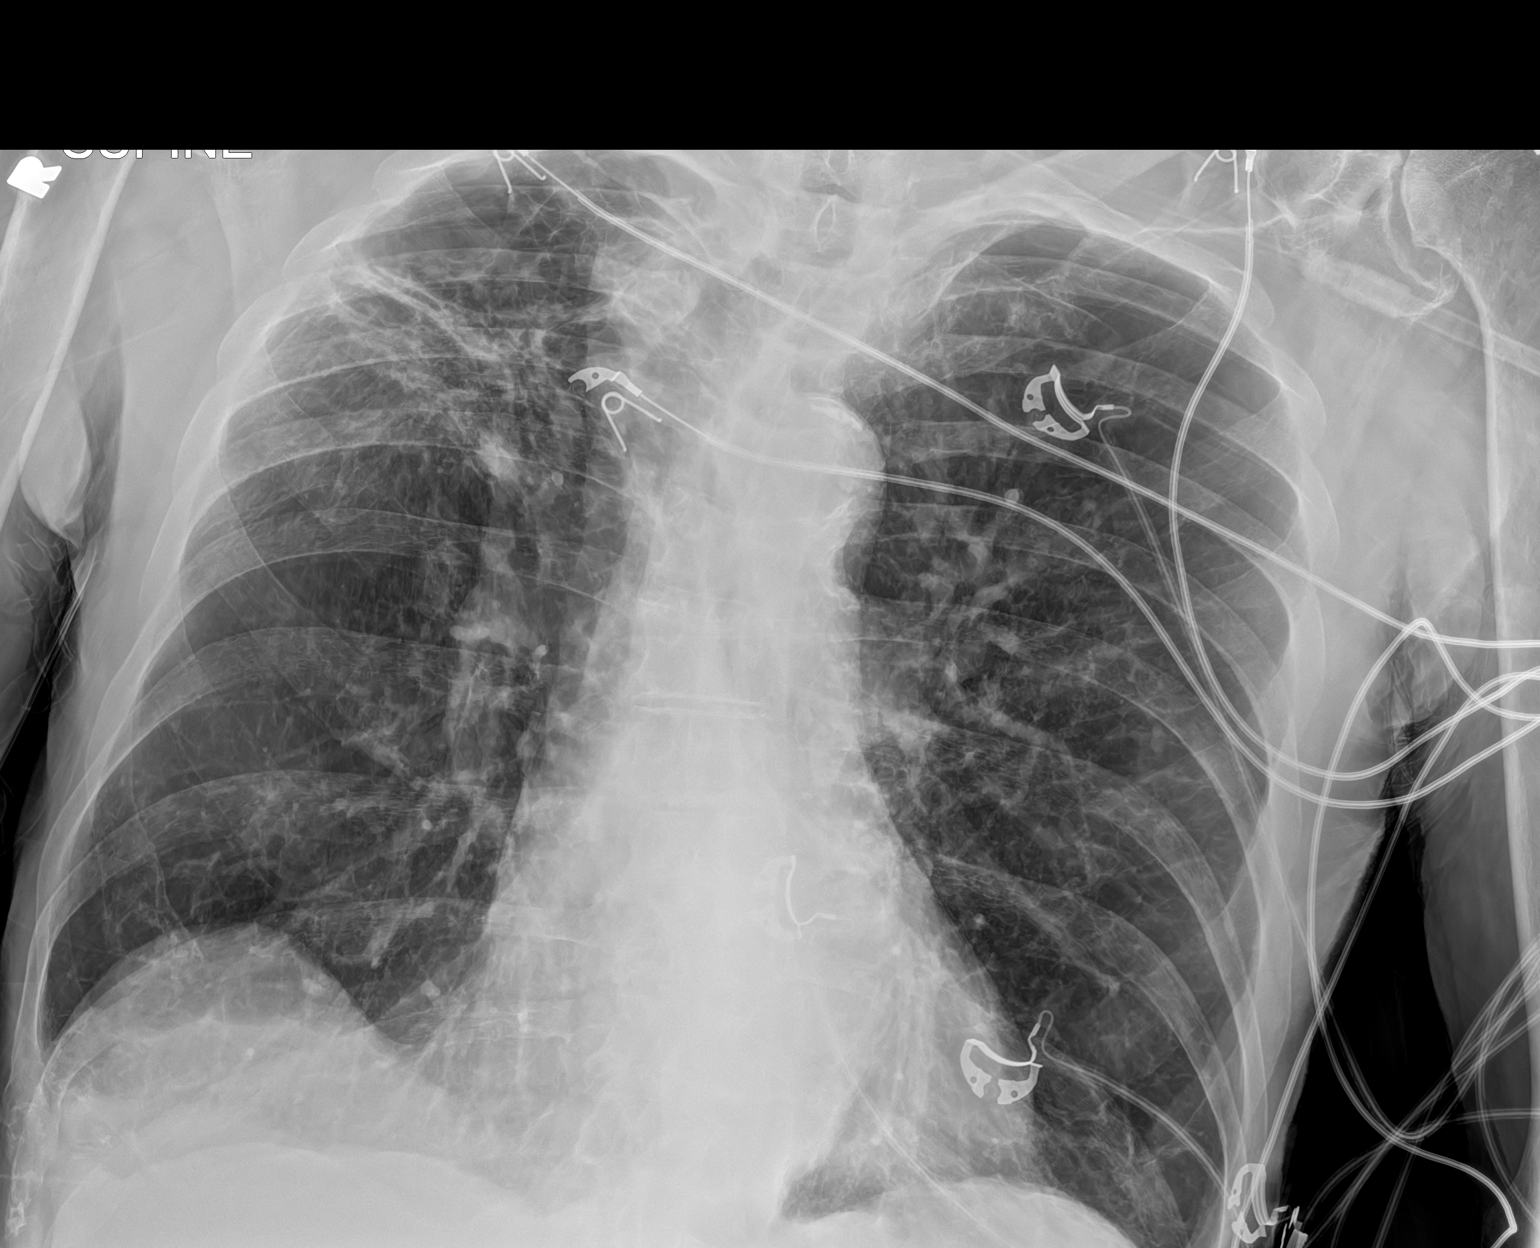

[chest ap (2 of 2)]
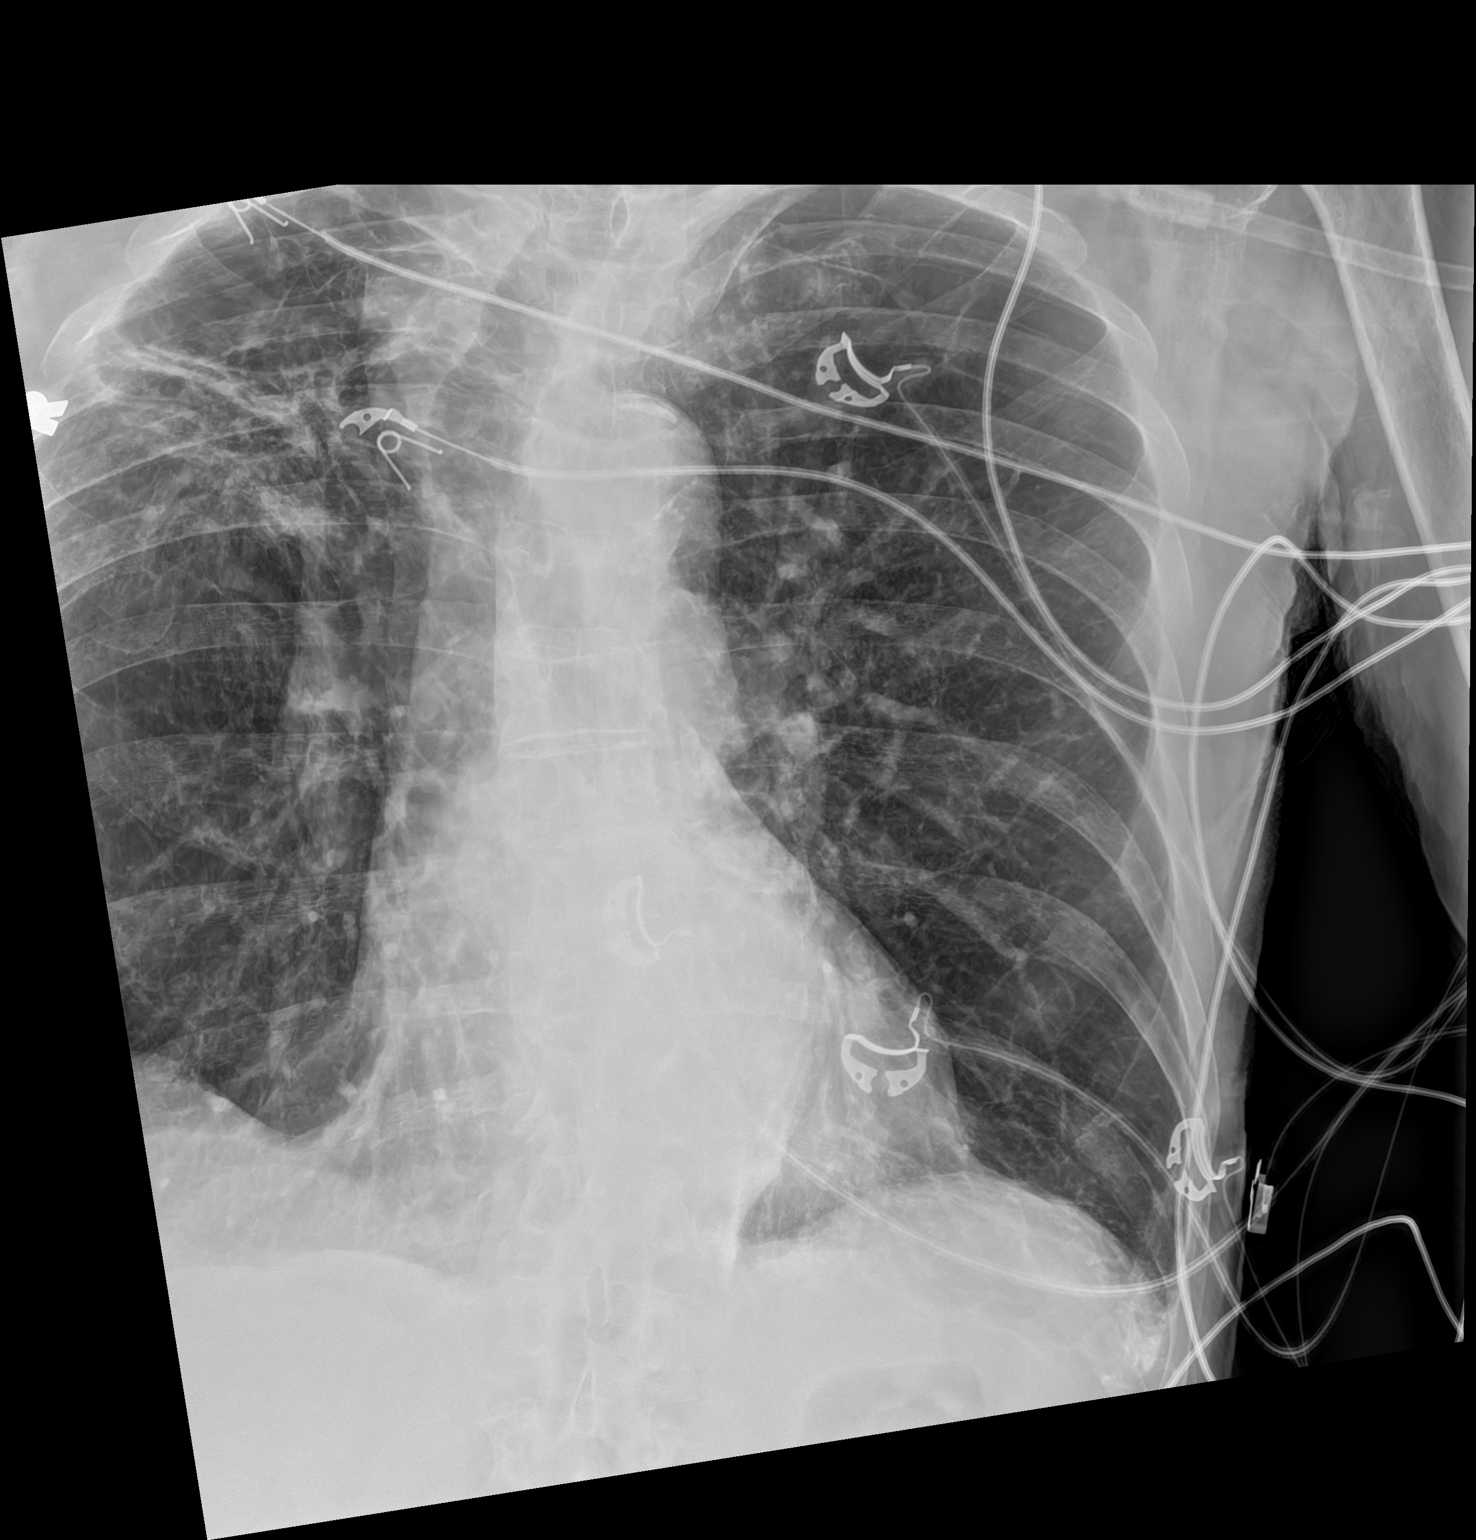

[2 of 2 positions shown; findings below may reference images not displayed]

FINDINGS: The heart size and mediastinal contours are within normal limits.
Scarring of the right upper lobe is unchanged. Emphysematous changes
are stable. The visualized skeletal structures are unremarkable.
IMPRESSION: No active cardiopulmonary disease identified.

## 2020-10-09 MED ORDER — CEFAZOLIN SODIUM-DEXTROSE 2-4 GM/100ML-% IV SOLN
2.0000 g | INTRAVENOUS | Status: AC
  Administered 2020-10-10: 2 g via INTRAVENOUS
  Filled 2020-10-09: qty 100

## 2020-10-09 MED ORDER — FENTANYL CITRATE (PF) 100 MCG/2ML IJ SOLN
50.0000 ug | INTRAMUSCULAR | Status: DC | PRN
  Administered 2020-10-09 – 2020-10-12 (×7): 50 ug via INTRAVENOUS
  Filled 2020-10-09 (×8): qty 2

## 2020-10-09 MED ORDER — ASPIRIN EC 81 MG PO TBEC
81.0000 mg | DELAYED_RELEASE_TABLET | Freq: Every day | ORAL | Status: DC
  Administered 2020-10-09 – 2020-10-12 (×3): 81 mg via ORAL
  Filled 2020-10-09 (×3): qty 1

## 2020-10-09 MED ORDER — FENTANYL CITRATE (PF) 100 MCG/2ML IJ SOLN
50.0000 ug | INTRAMUSCULAR | Status: DC | PRN
  Administered 2020-10-09: 50 ug via INTRAVENOUS
  Filled 2020-10-09: qty 2

## 2020-10-09 MED ORDER — DM-GUAIFENESIN ER 30-600 MG PO TB12
1.0000 | ORAL_TABLET | Freq: Two times a day (BID) | ORAL | Status: DC
  Administered 2020-10-09 – 2020-10-12 (×5): 1 via ORAL
  Filled 2020-10-09 (×5): qty 1

## 2020-10-09 MED ORDER — FLUTICASONE-UMECLIDIN-VILANT 100-62.5-25 MCG/INH IN AEPB
1.0000 | INHALATION_SPRAY | Freq: Every day | RESPIRATORY_TRACT | Status: DC
  Filled 2020-10-09 (×3): qty 1

## 2020-10-09 MED ORDER — ALBUTEROL SULFATE HFA 108 (90 BASE) MCG/ACT IN AERS
2.0000 | INHALATION_SPRAY | RESPIRATORY_TRACT | Status: DC | PRN
  Administered 2020-10-09: 2 via RESPIRATORY_TRACT
  Filled 2020-10-09: qty 6.7

## 2020-10-09 MED ORDER — LEVOTHYROXINE SODIUM 75 MCG PO TABS
75.0000 ug | ORAL_TABLET | Freq: Every day | ORAL | Status: DC
  Administered 2020-10-11 – 2020-10-12 (×2): 75 ug via ORAL
  Filled 2020-10-09 (×2): qty 1

## 2020-10-09 MED ORDER — PANTOPRAZOLE SODIUM 40 MG PO TBEC
40.0000 mg | DELAYED_RELEASE_TABLET | Freq: Every day | ORAL | Status: DC
  Administered 2020-10-11 – 2020-10-12 (×2): 40 mg via ORAL
  Filled 2020-10-09 (×2): qty 1

## 2020-10-09 MED ORDER — MIRTAZAPINE 15 MG PO TABS
7.5000 mg | ORAL_TABLET | Freq: Every day | ORAL | Status: DC
  Administered 2020-10-09 – 2020-10-11 (×3): 7.5 mg via ORAL
  Filled 2020-10-09 (×3): qty 1

## 2020-10-09 MED ORDER — METHOCARBAMOL 500 MG PO TABS: 500.0000 mg | ORAL_TABLET | Freq: Four times a day (QID) | ORAL | Status: DC | PRN

## 2020-10-09 MED ORDER — POLYVINYL ALCOHOL 1.4 % OP SOLN: 1.0000 [drp] | OPHTHALMIC | Status: DC | PRN

## 2020-10-09 MED ORDER — HYDROCODONE-ACETAMINOPHEN 5-325 MG PO TABS
1.0000 | ORAL_TABLET | Freq: Four times a day (QID) | ORAL | Status: DC | PRN
  Administered 2020-10-09 – 2020-10-12 (×3): 1 via ORAL
  Filled 2020-10-09 (×4): qty 1

## 2020-10-09 MED ORDER — HEPARIN SODIUM (PORCINE) 5000 UNIT/ML IJ SOLN
5000.0000 [IU] | Freq: Three times a day (TID) | INTRAMUSCULAR | Status: DC
  Administered 2020-10-09 – 2020-10-12 (×7): 5000 [IU] via SUBCUTANEOUS
  Filled 2020-10-09 (×7): qty 1

## 2020-10-09 MED ORDER — HYDROMORPHONE HCL 1 MG/ML IJ SOLN
0.5000 mg | Freq: Once | INTRAMUSCULAR | Status: AC
  Administered 2020-10-09: 0.5 mg via INTRAVENOUS
  Filled 2020-10-09: qty 1

## 2020-10-09 MED ORDER — TAMSULOSIN HCL 0.4 MG PO CAPS
0.4000 mg | ORAL_CAPSULE | Freq: Every day | ORAL | Status: DC
  Administered 2020-10-11 – 2020-10-12 (×2): 0.4 mg via ORAL
  Filled 2020-10-09 (×2): qty 1

## 2020-10-09 MED ORDER — POLYETHYLENE GLYCOL 3350 17 G PO PACK: 17.0000 g | PACK | Freq: Every day | ORAL | Status: DC | PRN

## 2020-10-09 MED ORDER — METHYLPREDNISOLONE SODIUM SUCC 125 MG IJ SOLR
60.0000 mg | Freq: Two times a day (BID) | INTRAMUSCULAR | Status: AC
  Administered 2020-10-09 – 2020-10-10 (×2): 60 mg via INTRAVENOUS
  Filled 2020-10-09 (×2): qty 2

## 2020-10-09 MED ORDER — LORAZEPAM 0.5 MG PO TABS: 0.5000 mg | ORAL_TABLET | Freq: Two times a day (BID) | ORAL | Status: DC | PRN

## 2020-10-09 MED ORDER — METHOCARBAMOL 1000 MG/10ML IJ SOLN: 500.0000 mg | Freq: Four times a day (QID) | INTRAVENOUS | Status: DC | PRN

## 2020-10-09 NOTE — ED Notes (Signed)
Pt pulled IV out. Blood noted on bed. Pt soiled brief. Pt bed linens changed and pt cleaned up. Pt now comfortable in bed with wife at bedside.

## 2020-10-09 NOTE — Consult Note (Signed)
ORTHOPAEDIC CONSULTATION  REQUESTING PHYSICIAN: Barton Dubois, MD  ASSESSMENT AND PLAN: 87 y.o. male with the following: Right Hip Displaced femoral neck fracture  This patient requires inpatient admission to the hospitalist, to include preoperative clearance and perioperative medical management  - Weight Bearing Status/Activity: NWB Right lower extremity  - Additional recommended labs/tests: Preop Labs: CBC, BMP, PT/INR, Chest XR and EKG  -VTE Prophylaxis: Please hold prior to OR; to resume POD#1 at the discretion of the primary team  - Pain control: Recommend PO pain medications PRN; judicious use of narcotics  - Follow-up plan: F/u 10-14 days postop  -Procedures: Plan for OR once patient has been medically optimized  Plan for Right Hip hemiarthroplasty   DOS: 10/10/2020  NPO the night before surgery; please hold DVT PPx morning of surgery     Chief Complaint: Right hip pain  HPI: John Black is a 87 y.o. male who presented to the ED for evaluation after sustaining a mechanical fall.  Early in the morning, he got up to use the restroom, realized he had forgotten his O2 and when he turned to get it, he fell.  He landed on his right side.  He did hit his head.  He has also had some right shoulder pain.  No numbness or tingling.  He is not in pain if he is resting still.  Pain is worse with movement.  He is also having some spasm pain in his right hip.  He arrived to the ED via EMS.  He lives with his wife, in a home with their daughter.   Past Medical History:  Diagnosis Date  . Allergic rhinitis   . Cataract    s/p removal  . Chronic respiratory failure (HCC)    oxygen 3L at home  . Emphysema   . Hypothyroid   . PNA (pneumonia)   . Prostate cancer [facility])    Past Surgical History:  Procedure Laterality Date  . ADENOIDECTOMY    . ROTATOR CUFF REPAIR  1990,2009   bilateral  . Seed implant for prostate cancer  2000  . TONSILLECTOMY    . TRANSURETHRAL  RESECTION OF PROSTATE  2001   x2   Social History   Socioeconomic History  . Marital status: Married    Spouse name: Not on file  . Number of children: Not on file  . Years of education: Not on file  . Highest education level: Not on file  Occupational History  . Occupation: Chief Financial Officer  Tobacco Use  . Smoking status: Former Smoker    Packs/day: 1.50    Years: 50.00    Pack years: 75.00    Types: Cigarettes    Quit date: 09/17/2008    Years since quitting: 12.0  . Smokeless tobacco: Never Used  Vaping Use  . Vaping Use: Never used  Substance and Sexual Activity  . Alcohol use: Yes    Alcohol/week: 1.0 standard drink    Types: 1 Glasses of wine per week    Comment: each evening  . Drug use: No  . Sexual activity: Not on file  Other Topics Concern  . Not on file  Social History Narrative  . Not on file   Social Determinants of Health   Financial Resource Strain: Low Risk   . Difficulty of Paying Living Expenses: Not very hard  Food Insecurity: Not on file  Transportation Needs: Not on file  Physical Activity: Not on file  Stress: Not on file  Social Connections: Not on  file   Family History  Problem Relation Age of Onset  . Heart disease Mother   . Prostate cancer Father    Allergies  Allergen Reactions  . Morphine     REACTION: sweats   Prior to Admission medications   Medication Sig Start Date End Date Taking? Authorizing Provider  acetaminophen (TYLENOL) 325 MG tablet Take 650 mg by mouth every 6 (six) hours as needed for mild pain.   Yes [provider]  aspirin EC 81 MG EC tablet Take 1 tablet (81 mg total) by mouth daily. Swallow whole. 09/10/20  Yes Rai, Ripudeep K, MD  calcium gluconate 500 MG tablet Take 500 tablets by mouth daily.   Yes [provider]  Fluticasone-Umeclidin-Vilant (TRELEGY ELLIPTA) 100-62.5-25 MCG/INH AEPB Inhale 1 puff into the lungs daily. 09/30/20  Yes Tanda Rockers, MD  guaiFENesin (MUCINEX) 600 MG 12 hr tablet  Take 600 mg by mouth 2 (two) times daily as needed for cough.   Yes [provider]  levothyroxine (SYNTHROID) 75 MCG tablet TAKE 1 TABLET (75 MCG TOTAL) BY MOUTH DAILY. Patient taking differently: Take 75 mcg by mouth daily before breakfast. 04/07/20  Yes K[place], Modena Nunnery, MD  LORazepam (ATIVAN) 0.5 MG tablet TAKE 1 TABLET (0.5 MG TOTAL) BY MOUTH 2 (TWO) TIMES DAILY AS NEEDED. Patient taking differently: Take 0.5 mg by mouth 2 (two) times daily as needed for anxiety. 05/24/20  Yes [place], Modena Nunnery, MD  mirtazapine (REMERON) 15 MG tablet TAKE 1/2 TABLET BY MOUTH IN THE EVENING Patient taking differently: Take 7.5 mg by mouth at bedtime. 09/21/20  Yes [place], Modena Nunnery, MD  pantoprazole (PROTONIX) 40 MG tablet TAKE 1 TABLET BY MOUTH EVERY DAY Patient taking differently: Take 40 mg by mouth daily. 04/13/20  Yes Magdalen Spatz, NP  polyvinyl alcohol (LIQUIFILM TEARS) 1.4 % ophthalmic solution Place 1 drop into both eyes as needed for dry eyes.   Yes [provider]  predniSONE (DELTASONE) 10 MG tablet Take 1 tablet (10 mg total) by mouth daily with breakfast. HOLD while on Prednisone taper 09/09/20  Yes Rai, Ripudeep K, MD  tamsulosin (FLOMAX) 0.4 MG CAPS capsule TAKE 1 CAPSULE BY MOUTH EVERY DAY Patient taking differently: Take 0.4 mg by mouth daily. 08/08/20  Yes [place], Modena Nunnery, MD  traMADol (ULTRAM) 50 MG tablet TAKE 1 TABLET (50 MG TOTAL) BY MOUTH EVERY 8 (EIGHT) HOURS AS NEEDED. FOR PAIN Patient taking differently: Take 50 mg by mouth every 8 (eight) hours as needed for moderate pain. 04/25/20  Yes [place], Modena Nunnery, MD  zolpidem (AMBIEN) 5 MG tablet TAKE 1 TABLET BY MOUTH EVERY DAY AT BEDTIME AS NEEDED FOR SLEEP Patient taking differently: Take 5 mg by mouth at bedtime. 08/08/20  Yes [place], Modena Nunnery, MD  Menthol, Topical Analgesic, (GOLD BOND ORIGINAL STRENGTH EX) Apply 1 application topically as needed (irritation).    [provider]  menthol-zinc oxide (GOLD BOND)  powder Apply 1 application topically See admin instructions. After showers  And as needed for irritation    [provider]  Respiratory Therapy Supplies (FLUTTER) DEVI Use as directed. 08/12/19   ParrettFonnie Mu, NP   DG Shoulder Right  Result Date: 10/09/2020 CLINICAL DATA:  Status post fall with right arm pain. EXAM: RIGHT SHOULDER - 2+ VIEW COMPARISON:  Chest x-ray October 09, 2020 FINDINGS: There is no evidence of fracture or dislocation. Degenerative joint changes of the right shoulder identified. Chronic scar of the right apex is noted. IMPRESSION:  No acute fracture or dislocation. Electronically Signed   By: Abelardo Diesel M.D.   On: 10/09/2020 15:44   Chest Portable 1 View  Result Date: 10/09/2020 CLINICAL DATA:  Preop for surgery. EXAM: PORTABLE CHEST 1 VIEW COMPARISON:  September 08, 2020 FINDINGS: The heart size and mediastinal contours are within normal limits. Scarring of the right upper lobe is unchanged. Emphysematous changes are stable. The visualized skeletal structures are unremarkable. IMPRESSION: No active cardiopulmonary disease identified. Electronically Signed   By: Abelardo Diesel M.D.   On: 10/09/2020 13:47   DG Hip Unilat W or Wo Pelvis 2-3 Views Right  Result Date: 10/09/2020 CLINICAL DATA:  Status post fall.  Right hip pain. EXAM: DG HIP (WITH OR WITHOUT PELVIS) 2-3V RIGHT COMPARISON:  CT abdomen pelvis Feb 04, 2020 FINDINGS: There is a foreshortened fracture through the right femoral neck. Mild right hip joint degenerative changes. Vascular calcifications. IMPRESSION: Right femoral neck fracture. Electronically Signed   By: Lovey Newcomer M.D.   On: 10/09/2020 07:33   Family History Reviewed and non-contributory, no pertinent history of problems with bleeding or anesthesia    Review of Systems No fevers or chills No numbness or tingling No chest pain No bowel or bladder dysfunction No GI distress No headaches    OBJECTIVE  Vitals: Patient Vitals for  the past 8 hrs:  BP Pulse Resp SpO2  10/09/20 2000 137/76 98 17 96 %  10/09/20 1900 126/76 88 13 --  10/09/20 1839 (!) 147/84 (!) 109 20 90 %  10/09/20 1800 (!) 159/103 (!) 108 -- --  10/09/20 1700 134/75 94 19 --  10/09/20 1600 138/83 (!) 102 (!) 33 --  10/09/20 1400 (!) 141/79 97 -- --  10/09/20 1345 -- 92 18 --  10/09/20 1300 123/84 97 (!) 29 --   General: Alert, no acute distress; thin male Cardiovascular: Extremities are warm Respiratory: No cyanosis, no use of accessory musculature; brething comfortably with nasal cannula Skin: No lesions in the area of chief complaint  Neurologic: Sensation intact distally  Psychiatric: Patient is competent for consent with normal mood and affect Lymphatic: No swelling obvious and reported other than the area involved in the exam below Extremities  RLE: Extremity held in a fixed position.  ROM deferred due to known fracture.  Sensation is intact distally in the sural, saphenous, DP, SP, and plantar nerve distribution. 2+ DP pulse.  Toes are WWP.  Active motion intact in the TA/EHL/GS. LLE: Sensation is intact distally in the sural, saphenous, DP, SP, and plantar nerve distribution. 2+ DP pulse.  Toes are WWP.  Active motion intact in the TA/EHL/GS. Tolerates gentle ROM of the hip.  No pain with axial loading.     Test Results Imaging XR of the Right demonstrates a Displaced femoral neck fracture.  Labs cbc Recent Labs    10/09/20 0800  WBC 13.0*  HGB 12.9*  HCT 40.7  PLT 286    Labs inflam No results for input(s): CRP in the last 72 hours.  Invalid input(s): ESR  Labs coag Recent Labs    10/09/20 0800  INR 1.0    Recent Labs    10/09/20 0800  NA 135  K 3.9  CL 97*  CO2 30  GLUCOSE 115*  BUN 25*  CREATININE 0.85  CALCIUM 9.1

## 2020-10-09 NOTE — ED Notes (Signed)
Apple juice and applesauce provided to patient. Pt pulled up in bed and head raised enough for patient to tolerate po intake. Pt has no complaints at  This time.

## 2020-10-09 NOTE — ED Notes (Signed)
Offered to feed patient. Pt refuses to eat. He states he can not tolerate to sit up any higher in the bed d/t his hip pain.

## 2020-10-09 NOTE — ED Provider Notes (Signed)
[facility] EMERGENCY DEPARTMENT Provider Note   CSN: [id number] Arrival date & time: 10/09/20  [id number]     History Chief Complaint  Patient presents with  . Hip Pain    Right hip pain following fall at home    John Black is a 87 y.o. male.  HPI 87 year old male presents with right hip injury.  Patient was going to the bathroom in the middle the night and realized he forgot his oxygen which she has to wear at all times.  Turn back around but turned too fast and fell.  Injured his right hip.  No head injury or other injury.  No weakness or numbness.  Pain is about a 5 out of 10.   Past Medical History:  Diagnosis Date  . Allergic rhinitis   . Cataract    s/p removal  . Chronic respiratory failure (HCC)    oxygen 3L at home  . Emphysema   . Hypothyroid   . PNA (pneumonia)   . Prostate cancer [facility])     Patient Active Problem List   Diagnosis Date Noted  . Closed right hip fracture ([place]) 10/09/2020  . Dysphagia 07/24/2019  . Protein calorie malnutrition ([place] ) 02/24/2019  . Gait instability 01/27/2019  . Right inguinal hernia   . Chronic diarrhea 12/06/2017  . Inguinal hernia 03/02/2014  . Constipation 03/02/2014  . Chronic respiratory failure with hypoxia ([place]) 12/09/2013  . Hemorrhoids 09/28/2013  . Hyperlipidemia 06/28/2013  . Sinusitis, chronic 11/28/2012  . COPD with acute exacerbation ([place]) 11/10/2012  . BPH (benign prostatic hyperplasia) 10/22/2012  . Insomnia 10/22/2012  . Pneumonia 09/05/2012  . Hypothyroidism 06/19/2012  . Atypical nevi 06/19/2012  . Seborrheic keratosis 06/19/2012  . History of prostate cancer 11/07/2010  . Allergic rhinitis 11/07/2010  . COPD GOLD 3/ group D  02 dep 11/07/2010    Past Surgical History:  Procedure Laterality Date  . ADENOIDECTOMY    . ROTATOR CUFF REPAIR  1990,2009   bilateral  . Seed implant for prostate cancer  2000  . TONSILLECTOMY    . TRANSURETHRAL RESECTION OF PROSTATE  2001   x2       Family  History  Problem Relation Age of Onset  . Heart disease Mother   . Prostate cancer Father     Social History   Tobacco Use  . Smoking status: Former Smoker    Packs/day: 1.50    Years: 50.00    Pack years: 75.00    Types: Cigarettes    Quit date: 09/17/2008    Years since quitting: 12.0  . Smokeless tobacco: Never Used  Vaping Use  . Vaping Use: Never used  Substance Use Topics  . Alcohol use: Yes    Alcohol/week: 1.0 standard drink    Types: 1 Glasses of wine per week    Comment: each evening  . Drug use: No    Home Medications Prior to Admission medications   Medication Sig Start Date End Date Taking? Authorizing Provider  acetaminophen (TYLENOL) 325 MG tablet Take 650 mg by mouth every 6 (six) hours as needed for mild pain.    [provider]  aspirin EC 81 MG EC tablet Take 1 tablet (81 mg total) by mouth daily. Swallow whole. 09/10/20   Rai, Ripudeep Raliegh Ip, MD  calcium gluconate 500 MG tablet Take 500 tablets by mouth daily.    [provider]  Fluticasone-Umeclidin-Vilant (TRELEGY ELLIPTA) 100-62.5-25 MCG/INH AEPB Inhale 1 puff into the lungs daily. 09/30/20   [place]ert,  Christena Deem, MD  guaiFENesin (MUCINEX) 600 MG 12 hr tablet Take 600 mg by mouth 2 (two) times daily as needed for cough.    [provider]  levothyroxine (SYNTHROID) 75 MCG tablet TAKE 1 TABLET (75 MCG TOTAL) BY MOUTH DAILY. Patient taking differently: Take 75 mcg by mouth daily before breakfast. 04/07/20   [place], Modena Nunnery, MD  LORazepam (ATIVAN) 0.5 MG tablet TAKE 1 TABLET (0.5 MG TOTAL) BY MOUTH 2 (TWO) TIMES DAILY AS NEEDED. Patient taking differently: Take 0.5 mg by mouth 2 (two) times daily as needed for anxiety. 05/24/20   Alycia Rossetti, MD  Menthol, Topical Analgesic, (GOLD BOND ORIGINAL STRENGTH EX) Apply 1 application topically as needed (irritation).    [provider]  menthol-zinc oxide (GOLD BOND) powder Apply 1 application topically See admin instructions. After  showers  And as needed for irritation    [provider]  mirtazapine (REMERON) 15 MG tablet TAKE 1/2 TABLET BY MOUTH IN THE EVENING 09/21/20   [place], Modena Nunnery, MD  pantoprazole (PROTONIX) 40 MG tablet TAKE 1 TABLET BY MOUTH EVERY DAY Patient taking differently: Take 40 mg by mouth daily. 04/13/20   Magdalen Spatz, NP  polyvinyl alcohol (LIQUIFILM TEARS) 1.4 % ophthalmic solution Place 1 drop into both eyes as needed for dry eyes.    [provider]  predniSONE (DELTASONE) 10 MG tablet Take 1 tablet (10 mg total) by mouth daily with breakfast. HOLD while on Prednisone taper 09/09/20   Rai, Vernelle Emerald, MD  Respiratory Therapy Supplies (FLUTTER) DEVI Use as directed. 08/12/19   Parrett, Fonnie Mu, NP  sodium chloride (OCEAN) 0.65 % SOLN nasal spray Place 1 spray into both nostrils as needed for congestion.    [provider]  tamsulosin (FLOMAX) 0.4 MG CAPS capsule TAKE 1 CAPSULE BY MOUTH EVERY DAY Patient taking differently: Take 0.4 mg by mouth daily. 08/08/20   [place], Modena Nunnery, MD  traMADol (ULTRAM) 50 MG tablet TAKE 1 TABLET (50 MG TOTAL) BY MOUTH EVERY 8 (EIGHT) HOURS AS NEEDED. FOR PAIN Patient taking differently: Take 50 mg by mouth every 8 (eight) hours as needed for moderate pain. 04/25/20   [place], Modena Nunnery, MD  zolpidem (AMBIEN) 5 MG tablet TAKE 1 TABLET BY MOUTH EVERY DAY AT BEDTIME AS NEEDED FOR SLEEP Patient taking differently: Take 5 mg by mouth at bedtime. 08/08/20   Alycia Rossetti, MD    Allergies    Morphine  Review of Systems   Review of Systems  Musculoskeletal: Positive for arthralgias.  Neurological: Negative for weakness, numbness and headaches.  All other systems reviewed and are negative.   Physical Exam Updated Vital Signs BP (!) 167/87   Pulse (!) 125   Temp 98.8 F (37.1 C) (Oral)   Resp (!) 36   Ht 5\' 5"  (1.651 m)   Wt 56.7 kg   SpO2 94%   BMI 20.80 kg/m   Physical Exam Vitals and nursing note reviewed.  Constitutional:       Appearance: He is well-developed and well-nourished.  HENT:     Head: Normocephalic and atraumatic.     Right Ear: External ear normal.     Left Ear: External ear normal.     Nose: Nose normal.  Eyes:     General:        Right eye: No discharge.        Left eye: No discharge.  Cardiovascular:     Rate and Rhythm: Regular rhythm. Tachycardia  present.     Heart sounds: Normal heart sounds.     Comments: HR~100 Pulmonary:     Effort: Pulmonary effort is normal.     Breath sounds: Wheezing (mild) present.  Abdominal:     Palpations: Abdomen is soft.     Tenderness: There is no abdominal tenderness.  Musculoskeletal:        General: No edema.     Cervical back: Neck supple.     Right hip: Deformity and tenderness present.     Comments: Right leg is shortened, externally rotated Normal strength/sensation in right foot  Skin:    General: Skin is warm and dry.  Neurological:     Mental Status: He is alert.  Psychiatric:        Mood and Affect: Mood is not anxious.     ED Results / Procedures / Treatments   Labs (all labs ordered are listed, but only abnormal results are displayed) Labs Reviewed  BASIC METABOLIC PANEL - Abnormal; Notable for the following components:      Result Value   Chloride 97 (*)    Glucose, Bld 115 (*)    BUN 25 (*)    All other components within normal limits  CBC WITH DIFFERENTIAL/PLATELET - Abnormal; Notable for the following components:   WBC 13.0 (*)    Hemoglobin 12.9 (*)    Neutro Abs 10.8 (*)    Monocytes Absolute 1.2 (*)    Abs Immature Granulocytes 0.18 (*)    All other components within normal limits  SARS CORONAVIRUS 2 (TAT 6-24 HRS)  PROTIME-INR  TYPE AND SCREEN    EKG EKG Interpretation  Date/Time:  Sunday October 09 2020 07:28:18 EST Ventricular Rate:  99 PR Interval:    QRS Duration: 73 QT Interval:  332 QTC Calculation: 426 R Axis:   73 Text Interpretation: Sinus rhythm similar to earlier in the day Confirmed by  Sherwood Gambler [phone]) on 10/09/2020 7:30:58 AM   Radiology DG Hip Unilat W or Wo Pelvis 2-3 Views Right  Result Date: 10/09/2020 CLINICAL DATA:  Status post fall.  Right hip pain. EXAM: DG HIP (WITH OR WITHOUT PELVIS) 2-3V RIGHT COMPARISON:  CT abdomen pelvis Feb 04, 2020 FINDINGS: There is a foreshortened fracture through the right femoral neck. Mild right hip joint degenerative changes. Vascular calcifications. IMPRESSION: Right femoral neck fracture. Electronically Signed   By: Lovey Newcomer M.D.   On: 10/09/2020 07:33    Procedures Procedures (including critical care time)  Medications Ordered in ED Medications  fentaNYL (SUBLIMAZE) injection 50 mcg (50 mcg Intravenous Given 10/09/20 0800)  albuterol (VENTOLIN HFA) 108 (90 Base) MCG/ACT inhaler 2 puff (2 puffs Inhalation Given 10/09/20 0806)  HYDROmorphone (DILAUDID) injection 0.5 mg (has no administration in time range)    ED Course  I have reviewed the triage vital signs and the nursing notes.  Pertinent labs & imaging results that were available during my care of the patient were reviewed by me and considered in my medical decision making (see chart for details).    MDM Rules/Calculators/A&P                          I discussed with Dr. Lynann Bologna of orthopedics.  He advises hospitalist admission and they will do surgery tomorrow morning.  Patient had some partial pain relief with fentanyl, will give half milligram of Dilaudid.  Otherwise, sounds like a mechanical fall.  Admit to hospitalist service.  Final Clinical Impression(s) /  ED Diagnoses Final diagnoses:  Closed displaced fracture of right femoral neck ([place])    Rx / DC Orders ED Discharge Orders    None       Sherwood Gambler, MD 10/09/20 1527

## 2020-10-09 NOTE — ED Triage Notes (Signed)
Pt arrived via [facility] from home c/o right hip pain following a fall. Pt reports he was walking in his home when he tried turning around and lost his balance tripping over his feet. Pt present with limited movement in RLE. Pt chronically on 4L O2 via [place].

## 2020-10-09 NOTE — H&P (Signed)
History and Physical    John Black [id number] DOB: October 11, 1928 DOA: 10/09/2020  PCP: Alycia Rossetti, MD   Patient coming from: Home  I have personally briefly reviewed patient's old medical records in [place]  Chief Complaint: Right hip pain.  HPI: John Black is a 87 y.o. male with medical history significant of chronic respiratory failure with hypoxia secondary to COPD, history of BPH, history of prostate cancer, hypothyroidism, depression/anxiety and gastroesophageal reflux disease; who presented to the hospital following mechanical fall landing on her right side with inability to bear weight and with intractable pain.  Patient reports pain to be dull in nature 6 out of 10, worse with any range of motion in his right leg or if he tries to put weight on it.  No chest pain, no fever, no nausea, no vomiting, no abdominal pain, reports breathing at baseline 14, no dysuria, no hematuria, no melena, no hematochezia.  Patient denies sick contacts.  ED Course: Chest x-ray without acute cardiopulmonary process; right hip images demonstrating acute right femoral neck fracture; pending COVID test; mild leukocytosis with WBCs of 13.0 and a stable basic metabolic panel.  Orthopedic service has been consulted and recommendations given for admission to pursue surgical repair.  TRH has been called to facilitate patient's placement and further management.  Review of Systems: As per HPI otherwise all other systems reviewed and are negative.   Past Medical History:  Diagnosis Date  . Allergic rhinitis   . Cataract    s/p removal  . Chronic respiratory failure (HCC)    oxygen 3L at home  . Emphysema   . Hypothyroid   . PNA (pneumonia)   . Prostate cancer [facility])     Past Surgical History:  Procedure Laterality Date  . ADENOIDECTOMY    . ROTATOR CUFF REPAIR  1990,2009   bilateral  . Seed implant for prostate cancer  2000  . TONSILLECTOMY    . TRANSURETHRAL RESECTION  OF PROSTATE  2001   x2    Social History  reports that he quit smoking about 12 years ago. His smoking use included cigarettes. He has a 75.00 pack-year smoking history. He has never used smokeless tobacco. He reports current alcohol use of about 1.0 standard drink of alcohol per week. He reports that he does not use drugs.  Allergies  Allergen Reactions  . Morphine     REACTION: sweats    Family History  Problem Relation Age of Onset  . Heart disease Mother   . Prostate cancer Father     Prior to Admission medications   Medication Sig Start Date End Date Taking? Authorizing Provider  acetaminophen (TYLENOL) 325 MG tablet Take 650 mg by mouth every 6 (six) hours as needed for mild pain.   Yes [provider]  aspirin EC 81 MG EC tablet Take 1 tablet (81 mg total) by mouth daily. Swallow whole. 09/10/20  Yes Rai, Ripudeep K, MD  calcium gluconate 500 MG tablet Take 500 tablets by mouth daily.   Yes [provider]  Fluticasone-Umeclidin-Vilant (TRELEGY ELLIPTA) 100-62.5-25 MCG/INH AEPB Inhale 1 puff into the lungs daily. 09/30/20  Yes Tanda Rockers, MD  guaiFENesin (MUCINEX) 600 MG 12 hr tablet Take 600 mg by mouth 2 (two) times daily as needed for cough.   Yes [provider]  levothyroxine (SYNTHROID) 75 MCG tablet TAKE 1 TABLET (75 MCG TOTAL) BY MOUTH DAILY. Patient taking differently: Take 75 mcg by mouth daily before breakfast. 04/07/20  Yes [place], Modena Nunnery, MD  LORazepam (ATIVAN) 0.5 MG tablet TAKE 1 TABLET (0.5 MG TOTAL) BY MOUTH 2 (TWO) TIMES DAILY AS NEEDED. Patient taking differently: Take 0.5 mg by mouth 2 (two) times daily as needed for anxiety. 05/24/20  Yes [place], Modena Nunnery, MD  mirtazapine (REMERON) 15 MG tablet TAKE 1/2 TABLET BY MOUTH IN THE EVENING Patient taking differently: Take 7.5 mg by mouth at bedtime. 09/21/20  Yes [place], Modena Nunnery, MD  pantoprazole (PROTONIX) 40 MG tablet TAKE 1 TABLET BY MOUTH EVERY DAY Patient taking differently:  Take 40 mg by mouth daily. 04/13/20  Yes Magdalen Spatz, NP  polyvinyl alcohol (LIQUIFILM TEARS) 1.4 % ophthalmic solution Place 1 drop into both eyes as needed for dry eyes.   Yes [provider]  predniSONE (DELTASONE) 10 MG tablet Take 1 tablet (10 mg total) by mouth daily with breakfast. HOLD while on Prednisone taper 09/09/20  Yes Rai, Ripudeep K, MD  tamsulosin (FLOMAX) 0.4 MG CAPS capsule TAKE 1 CAPSULE BY MOUTH EVERY DAY Patient taking differently: Take 0.4 mg by mouth daily. 08/08/20  Yes [place], Modena Nunnery, MD  traMADol (ULTRAM) 50 MG tablet TAKE 1 TABLET (50 MG TOTAL) BY MOUTH EVERY 8 (EIGHT) HOURS AS NEEDED. FOR PAIN Patient taking differently: Take 50 mg by mouth every 8 (eight) hours as needed for moderate pain. 04/25/20  Yes [place], Modena Nunnery, MD  zolpidem (AMBIEN) 5 MG tablet TAKE 1 TABLET BY MOUTH EVERY DAY AT BEDTIME AS NEEDED FOR SLEEP Patient taking differently: Take 5 mg by mouth at bedtime. 08/08/20  Yes [place], Modena Nunnery, MD  Menthol, Topical Analgesic, (GOLD BOND ORIGINAL STRENGTH EX) Apply 1 application topically as needed (irritation).    [provider]  menthol-zinc oxide (GOLD BOND) powder Apply 1 application topically See admin instructions. After showers  And as needed for irritation    [provider]  Respiratory Therapy Supplies (FLUTTER) DEVI Use as directed. 08/12/19   Melvenia Needles, NP    Physical Exam: Vitals:   10/09/20 1345 10/09/20 1400 10/09/20 1600 10/09/20 1700  BP:  (!) 141/79 138/83 134/75  Pulse: 92 97 (!) 102 94  Resp: 18  (!) 33 19  Temp:      TempSrc:      SpO2:      Weight:      Height:        Constitutional: Mild difficulty speaking in full sentences positive tachypnea appreciated.  Patient denies chest pain, no nausea, no vomiting, no fever. Vitals:   10/09/20 1345 10/09/20 1400 10/09/20 1600 10/09/20 1700  BP:  (!) 141/79 138/83 134/75  Pulse: 92 97 (!) 102 94  Resp: 18  (!) 33 19  Temp:      TempSrc:       SpO2:      Weight:      Height:       Eyes: PERRL, lids and conjunctivae normal, no icterus ENMT: Mucous membranes are moist. Posterior pharynx clear of any exudate or lesions. Neck: normal, supple, no masses, no thyromegaly Respiratory: Positive expiratory wheezing, no using accessory muscles, bilateral rhonchi appreciated.  No crackles Cardiovascular: Sinus tachycardia, no rubs, no gallops, no JVD, no lower extremity swelling. Abdomen: no tenderness, no masses palpated. No hepatosplenomegaly. Bowel sounds positive.  Musculoskeletal: no clubbing / cyanosis.  Externally rotated and shortening right lower extremity in comparison to his left.  Decreased range of motion appreciated.  Tenderness to any movement appreciated. Skin: no rashes, no petechiae. Neurologic:  CN 2-12 grossly intact. Sensation intact, no focal neurologic deficits appreciated. Psychiatric: Normal judgment and insight. Alert and oriented x 3. Normal mood.    Labs on Admission: I have personally reviewed following labs and imaging studies  CBC: Recent Labs  Lab 10/09/20 0800  WBC 13.0*  NEUTROABS 10.8*  HGB 12.9*  HCT 40.7  MCV 94.7  PLT 440    Basic Metabolic Panel: Recent Labs  Lab 10/09/20 0800  NA 135  K 3.9  CL 97*  CO2 30  GLUCOSE 115*  BUN 25*  CREATININE 0.85  CALCIUM 9.1    GFR: Estimated Creatinine Clearance: 47.3 mL/min (by C-G formula based on SCr of 0.85 mg/dL).  Urine analysis:    Component Value Date/Time   COLORURINE YELLOW 02/17/2020 0410   APPEARANCEUR CLEAR 02/17/2020 0410   LABSPEC >1.046 (H) 02/17/2020 0410   PHURINE 5.0 02/17/2020 0410   GLUCOSEU >=500 (A) 02/17/2020 0410   HGBUR SMALL (A) 02/17/2020 0410   BILIRUBINUR NEGATIVE 02/17/2020 0410   KETONESUR 5 (A) 02/17/2020 0410   PROTEINUR 30 (A) 02/17/2020 0410   UROBILINOGEN 0.2 10/02/2013 1208   NITRITE NEGATIVE 02/17/2020 0410   LEUKOCYTESUR NEGATIVE 02/17/2020 0410    Radiological Exams on Admission: DG  Shoulder Right  Result Date: 10/09/2020 CLINICAL DATA:  Status post fall with right arm pain. EXAM: RIGHT SHOULDER - 2+ VIEW COMPARISON:  Chest x-ray October 09, 2020 FINDINGS: There is no evidence of fracture or dislocation. Degenerative joint changes of the right shoulder identified. Chronic scar of the right apex is noted. IMPRESSION: No acute fracture or dislocation. Electronically Signed   By: Abelardo Diesel M.D.   On: 10/09/2020 15:44   Chest Portable 1 View  Result Date: 10/09/2020 CLINICAL DATA:  Preop for surgery. EXAM: PORTABLE CHEST 1 VIEW COMPARISON:  September 08, 2020 FINDINGS: The heart size and mediastinal contours are within normal limits. Scarring of the right upper lobe is unchanged. Emphysematous changes are stable. The visualized skeletal structures are unremarkable. IMPRESSION: No active cardiopulmonary disease identified. Electronically Signed   By: Abelardo Diesel M.D.   On: 10/09/2020 13:47   DG Hip Unilat W or Wo Pelvis 2-3 Views Right  Result Date: 10/09/2020 CLINICAL DATA:  Status post fall.  Right hip pain. EXAM: DG HIP (WITH OR WITHOUT PELVIS) 2-3V RIGHT COMPARISON:  CT abdomen pelvis Feb 04, 2020 FINDINGS: There is a foreshortened fracture through the right femoral neck. Mild right hip joint degenerative changes. Vascular calcifications. IMPRESSION: Right femoral neck fracture. Electronically Signed   By: Lovey Newcomer M.D.   On: 10/09/2020 07:33    EKG: Independently reviewed.  PVCs, sinus tachycardia and no acute ischemic changes appreciated.  Normal QT.  Assessment/Plan 1-acute right hip fracture -Patient reported mechanical fall and landing on his right side -Chest x-ray demonstrating acute right neck femur fracture -On physical examination vascularly intact, demonstrating decreased range of motion, external rotation and shortening aspect in comparison to his left leg. -Case discussed with orthopedic service who has recommended surgical repair. -Patient will be kept  n.p.o. after midnight in anticipation for surgery -Continue as needed analgesics and muscle relaxant -Physical therapy and Occupational Therapy to work with patient after surgical repair -Will check vitamin D level.  2-chronic respiratory failure with hypoxia secondary to COPD -Continue home inhaler regimen -Patient on examination was a slightly tachypneic and having some difficulty speaking in full sentences; will provide Solu-Medrol therapy x3 doses before resumption of his chronic steroids usage on daily basis. -Continue 4 L  nasal cannula supplementation -Instructed to use incentive spirometer and flutter valve -Follow clinical response..  3-hypothyroidism -Continue Synthroid  4-gastroesophageal reflux disease -Continue PPI  5-history of BPH -Continue Flomax  6-depression/anxiety -Continue the use of Remeron and as needed Ativan  7-right shoulder pain -Patient's and his wife reporting some discomfort on his right shoulder -X-ray demonstrating no acute bowel abnormality or displacement -Continue as needed analgesics.  DVT prophylaxis: Heparin Code Status:   Full code. Family Communication:  No family at bedside. Disposition Plan:   Patient is from:  Home  Anticipated DC to:  Will most likely require a skilled nursing facility for rehabilitation.  Anticipated DC date:  2-3 days.  Anticipated DC barriers: Surgical repair of acute hip fracture. Consults called:  Orthopedic service Admission status:  MedSurg, inpatient, length of stay more than 2 midnights.  Severity of Illness: Moderate illness; patient presented after mechanical fall with right hip pain and inability to bear weight.  Found to have close acute right knee femoral fracture.  Case discussed with orthopedic surgery who has recommended surgical repair.  A moderate to severe risk for surgical intervention given comorbidities, age and chronic respiratory failure.  Would recommend as much as possible to avoid general  anesthesia and to use incentive spirometer/flutter valve.   Barton Dubois MD [facility]  How to contact the [facility] Attending or Consulting provider [place] or covering provider during after hours [place], for this patient?   1. Check the care team in [facility] and look for a) attending/consulting [facility] provider listed and b) the [facility] team listed 2. Log into [email or web address] and use [place]'s universal password to access. If you do not have the password, please contact the hospital operator. 3. Locate the [facility] provider you are looking for under [facility] and page to a number that you can be directly reached. 4. If you still have difficulty reaching the provider, please page the [facility] (Director on Call) for the Hospitalists listed on amion for assistance.  10/09/2020, 6:14 PM

## 2020-10-09 NOTE — ED Notes (Signed)
Family has been updated on plan of care. Wife and patient verbalized understanding of current plan.

## 2020-10-10 ENCOUNTER — Encounter (HOSPITAL_COMMUNITY)
Admission: EM | Disposition: A | Discharge status: Skilled Nursing Facility | Payer: Self-pay | Source: Home / Self Care | Attending: Internal Medicine

## 2020-10-10 ENCOUNTER — Encounter: Payer: Self-pay | Admitting: Family Medicine

## 2020-10-10 ENCOUNTER — Inpatient Hospital Stay (HOSPITAL_COMMUNITY): Payer: PPO | Admitting: Anesthesiology

## 2020-10-10 ENCOUNTER — Inpatient Hospital Stay (HOSPITAL_COMMUNITY): Payer: PPO

## 2020-10-10 ENCOUNTER — Encounter (HOSPITAL_COMMUNITY): Payer: Self-pay | Admitting: Internal Medicine

## 2020-10-10 ENCOUNTER — Other Ambulatory Visit: Payer: Self-pay

## 2020-10-10 DIAGNOSIS — Z01818 Encounter for other preprocedural examination: Secondary | ICD-10-CM

## 2020-10-10 DIAGNOSIS — G4700 Insomnia, unspecified: Secondary | ICD-10-CM

## 2020-10-10 HISTORY — PX: HIP ARTHROPLASTY: SHX981

## 2020-10-10 LAB — CBC
HCT: 43 % (ref 39.0–52.0)
Hemoglobin: 13.9 g/dL (ref 13.0–17.0)
MCH: 30.2 pg (ref 26.0–34.0)
MCHC: 32.3 g/dL (ref 30.0–36.0)
MCV: 93.3 fL (ref 80.0–100.0)
Platelets: 338 10*3/uL (ref 150–400)
RBC: 4.61 MIL/uL (ref 4.22–5.81)
RDW: 13.8 % (ref 11.5–15.5)
WBC: 11.9 10*3/uL — ABNORMAL HIGH (ref 4.0–10.5)
nRBC: 0 % (ref 0.0–0.2)

## 2020-10-10 LAB — BASIC METABOLIC PANEL
Anion gap: 11 (ref 5–15)
BUN: 17 mg/dL (ref 8–23)
CO2: 28 mmol/L (ref 22–32)
Calcium: 9 mg/dL (ref 8.9–10.3)
Chloride: 94 mmol/L — ABNORMAL LOW (ref 98–111)
Creatinine, Ser: 0.8 mg/dL (ref 0.61–1.24)
GFR, Estimated: >60 mL/min (ref >60)
Glucose, Bld: 165 mg/dL — ABNORMAL HIGH (ref 70–99)
Potassium: 4.2 mmol/L (ref 3.5–5.1)
Sodium: 133 mmol/L — ABNORMAL LOW (ref 135–145)

## 2020-10-10 IMAGING — DX DG HIP (WITH PELVIS) OPERATIVE*R*
3 series · 3 of 3 positions shown · non-contrast
Comparison: [DATE], CT from [DATE]

CLINICAL DATA: Status post right hip replacement

EXAM:
OPERATIVE RIGHT HIP WITH PELVIS

[pelvis ap]
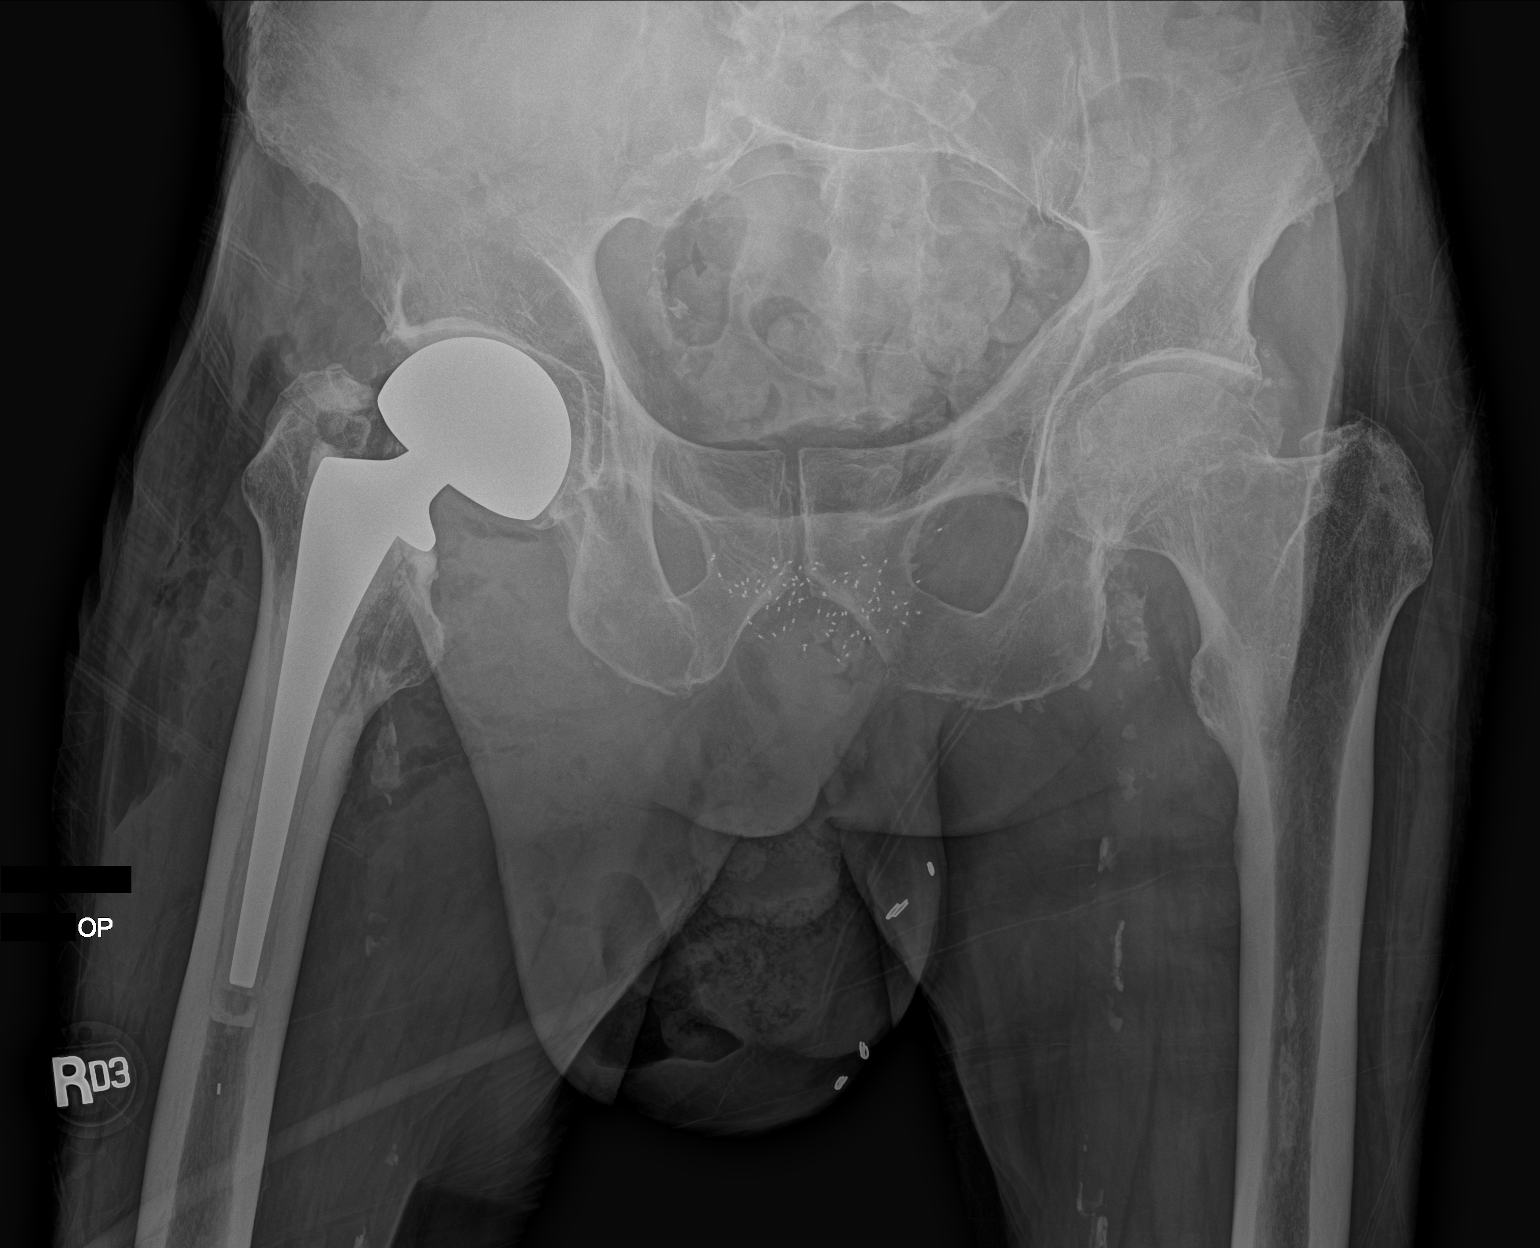

[hip ap]
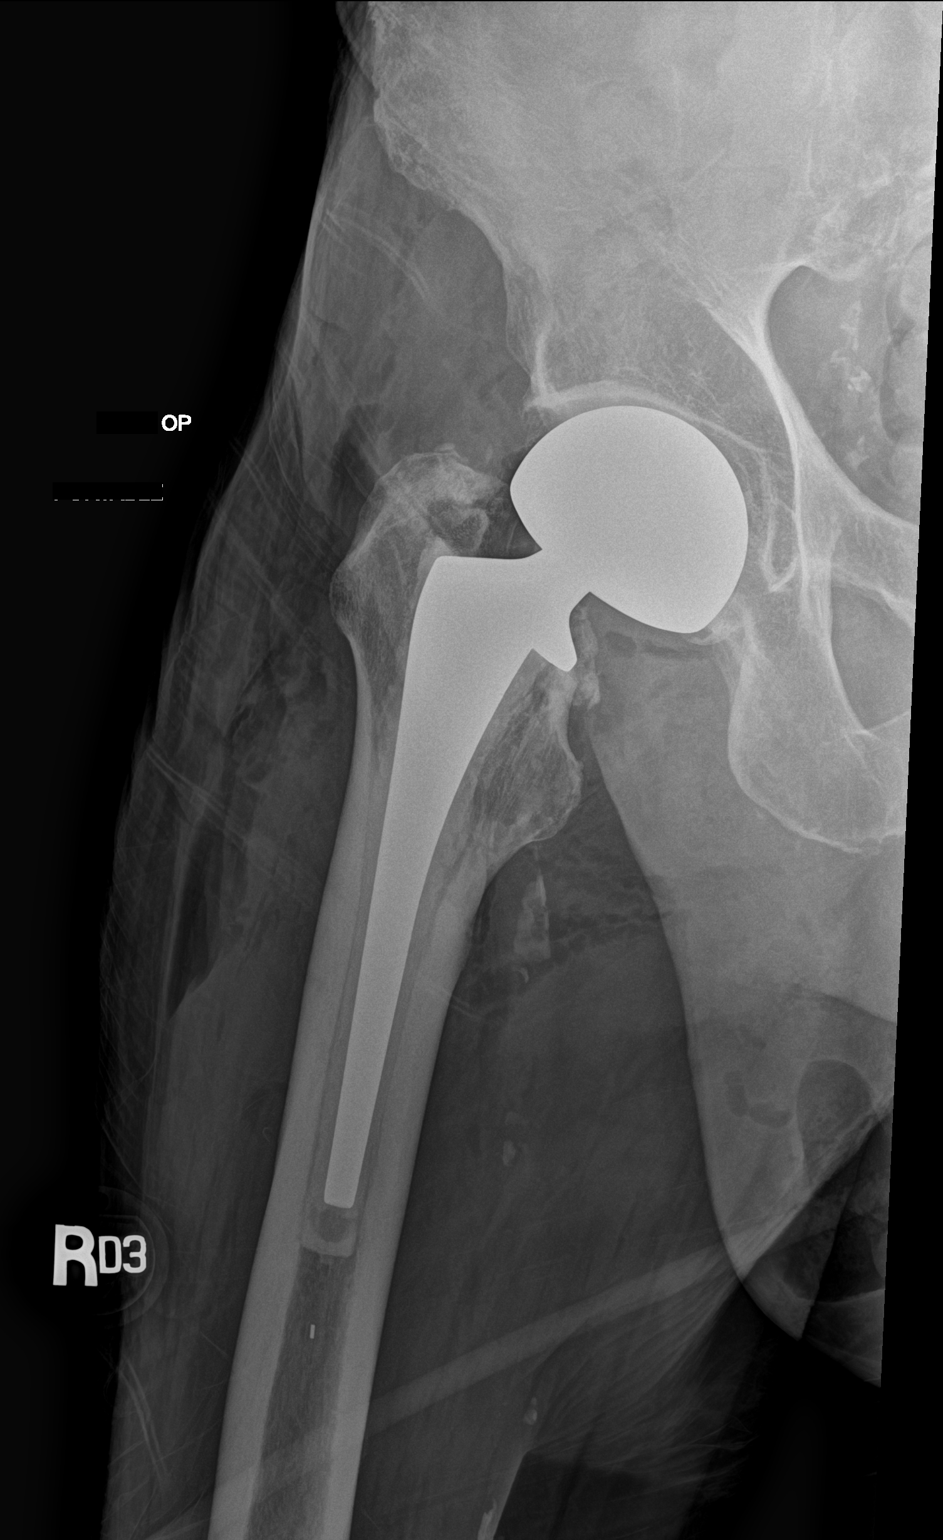

[hip frog leg]
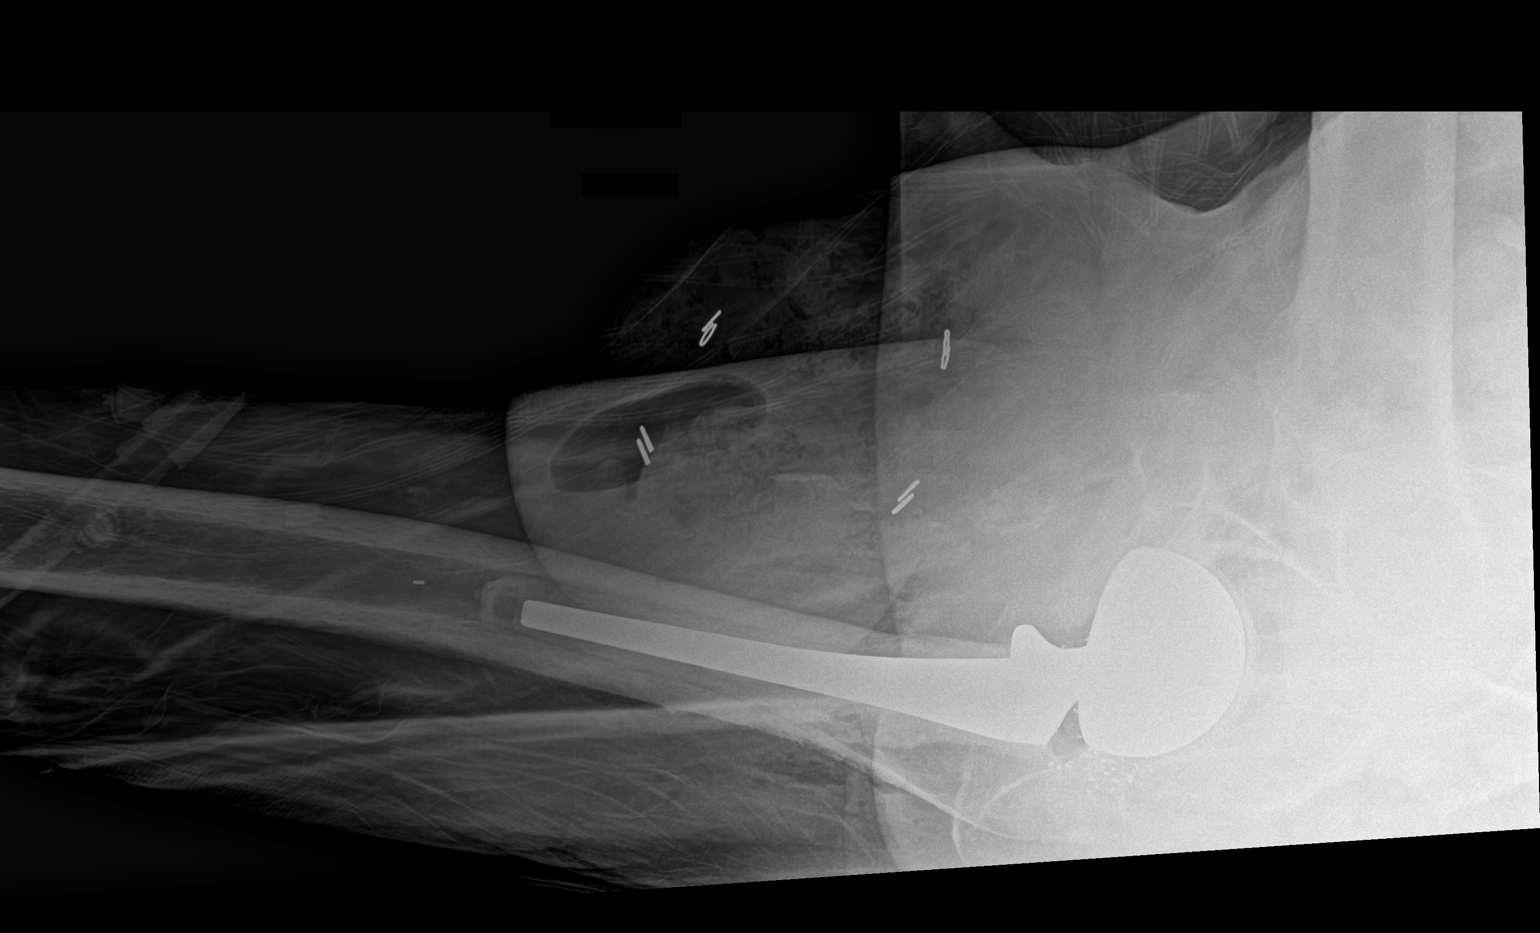

[3 of 3 positions shown; findings below may reference images not displayed]

FINDINGS: Pelvic ring is intact. Prostate brachytherapy seeds are noted. Right
hip prosthesis is now seen in satisfactory position. Changes
consistent with the known right inguinal hernia are again seen. No
car serration is noted.
IMPRESSION: Status post right hip prosthesis.

Changes of right inguinal hernia stable from prior CT.

## 2020-10-10 SURGERY — HEMIARTHROPLASTY, HIP, DIRECT ANTERIOR APPROACH, FOR FRACTURE
Anesthesia: Spinal | Site: Hip | Laterality: Right

## 2020-10-10 MED ORDER — FENTANYL CITRATE (PF) 100 MCG/2ML IJ SOLN
INTRAMUSCULAR | Status: AC
  Filled 2020-10-10: qty 2

## 2020-10-10 MED ORDER — PROPOFOL 10 MG/ML IV BOLUS
INTRAVENOUS | Status: DC | PRN
  Administered 2020-10-10 (×3): 20 mg via INTRAVENOUS

## 2020-10-10 MED ORDER — UMECLIDINIUM BROMIDE 62.5 MCG/INH IN AEPB
1.0000 | INHALATION_SPRAY | Freq: Every day | RESPIRATORY_TRACT | Status: DC
  Administered 2020-10-10 – 2020-10-12 (×3): 1 via RESPIRATORY_TRACT
  Filled 2020-10-10: qty 7

## 2020-10-10 MED ORDER — CHLORHEXIDINE GLUCONATE CLOTH 2 % EX PADS
6.0000 | MEDICATED_PAD | Freq: Every day | CUTANEOUS | Status: DC
  Administered 2020-10-10 – 2020-10-12 (×3): 6 via TOPICAL

## 2020-10-10 MED ORDER — SODIUM CHLORIDE 0.9 % IR SOLN
Status: DC | PRN
  Administered 2020-10-10: 3000 mL

## 2020-10-10 MED ORDER — CEFAZOLIN SODIUM-DEXTROSE 2-4 GM/100ML-% IV SOLN
INTRAVENOUS | Status: AC
  Filled 2020-10-10: qty 100

## 2020-10-10 MED ORDER — FENTANYL CITRATE (PF) 100 MCG/2ML IJ SOLN
INTRAMUSCULAR | Status: DC | PRN
  Administered 2020-10-10: 5 ug via INTRAVENOUS
  Administered 2020-10-10 (×2): 12.5 ug via INTRAVENOUS
  Administered 2020-10-10 (×2): 25 ug via INTRAVENOUS

## 2020-10-10 MED ORDER — BUPIVACAINE IN DEXTROSE 0.75-8.25 % IT SOLN
INTRATHECAL | Status: AC
  Filled 2020-10-10: qty 2

## 2020-10-10 MED ORDER — FENTANYL CITRATE (PF) 100 MCG/2ML IJ SOLN
INTRAMUSCULAR | Status: DC | PRN
  Administered 2020-10-10: 20 ug via INTRATHECAL

## 2020-10-10 MED ORDER — 0.9 % SODIUM CHLORIDE (POUR BTL) OPTIME
TOPICAL | Status: DC | PRN
  Administered 2020-10-10: 1000 mL

## 2020-10-10 MED ORDER — BUPIVACAINE-EPINEPHRINE (PF) 0.5% -1:200000 IJ SOLN
INTRAMUSCULAR | Status: DC | PRN
  Administered 2020-10-10: 20 mL

## 2020-10-10 MED ORDER — TRANEXAMIC ACID-NACL 1000-0.7 MG/100ML-% IV SOLN
1000.0000 mg | INTRAVENOUS | Status: AC
  Administered 2020-10-10: 1000 mg via INTRAVENOUS

## 2020-10-10 MED ORDER — EPINEPHRINE 1 MG/10ML IJ SOSY: PREFILLED_SYRINGE | INTRAMUSCULAR | Status: DC | PRN

## 2020-10-10 MED ORDER — BUPIVACAINE-EPINEPHRINE (PF) 0.5% -1:200000 IJ SOLN
INTRAMUSCULAR | Status: AC
  Filled 2020-10-10: qty 30

## 2020-10-10 MED ORDER — EPINEPHRINE 1 MG/10ML IJ SOSY
PREFILLED_SYRINGE | INTRAMUSCULAR | Status: DC | PRN
  Administered 2020-10-10: .01 ug via INTRATRACHEAL

## 2020-10-10 MED ORDER — PHENYLEPHRINE 40 MCG/ML (10ML) SYRINGE FOR IV PUSH (FOR BLOOD PRESSURE SUPPORT)
PREFILLED_SYRINGE | INTRAVENOUS | Status: AC
  Filled 2020-10-10: qty 10

## 2020-10-10 MED ORDER — PHENYLEPHRINE 40 MCG/ML (10ML) SYRINGE FOR IV PUSH (FOR BLOOD PRESSURE SUPPORT)
PREFILLED_SYRINGE | INTRAVENOUS | Status: AC
  Filled 2020-10-10: qty 20

## 2020-10-10 MED ORDER — FLUTICASONE FUROATE-VILANTEROL 100-25 MCG/INH IN AEPB
1.0000 | INHALATION_SPRAY | Freq: Every day | RESPIRATORY_TRACT | Status: DC
  Administered 2020-10-10 – 2020-10-12 (×3): 1 via RESPIRATORY_TRACT
  Filled 2020-10-10: qty 28

## 2020-10-10 MED ORDER — PHENYLEPHRINE HCL (PRESSORS) 10 MG/ML IV SOLN
INTRAVENOUS | Status: DC | PRN
  Administered 2020-10-10 (×7): 80 ug via INTRAVENOUS
  Administered 2020-10-10 (×2): 40 ug via INTRAVENOUS
  Administered 2020-10-10: 80 ug via INTRAVENOUS

## 2020-10-10 MED ORDER — BUPIVACAINE LIPOSOME 1.3 % IJ SUSP
INTRAMUSCULAR | Status: AC
  Filled 2020-10-10: qty 20

## 2020-10-10 MED ORDER — CEFAZOLIN SODIUM-DEXTROSE 2-4 GM/100ML-% IV SOLN
2.0000 g | Freq: Three times a day (TID) | INTRAVENOUS | Status: AC
  Administered 2020-10-10 – 2020-10-11 (×3): 2 g via INTRAVENOUS
  Filled 2020-10-10 (×3): qty 100

## 2020-10-10 MED ORDER — EPINEPHRINE PF 1 MG/ML IJ SOLN
INTRAMUSCULAR | Status: AC
  Filled 2020-10-10: qty 1

## 2020-10-10 MED ORDER — PROPOFOL 500 MG/50ML IV EMUL
INTRAVENOUS | Status: DC | PRN
  Administered 2020-10-10: 15 ug/kg/min via INTRAVENOUS
  Administered 2020-10-10: 40 ug/kg/min via INTRAVENOUS

## 2020-10-10 MED ORDER — MIRTAZAPINE 15 MG PO TABS: 7.5000 mg | ORAL_TABLET | Freq: Once | ORAL | Status: DC | PRN

## 2020-10-10 MED ORDER — TRANEXAMIC ACID-NACL 1000-0.7 MG/100ML-% IV SOLN
INTRAVENOUS | Status: AC
  Filled 2020-10-10: qty 100

## 2020-10-10 MED ORDER — BUPIVACAINE IN DEXTROSE 0.75-8.25 % IT SOLN
INTRATHECAL | Status: DC | PRN
  Administered 2020-10-10: 11.75 mg via INTRATHECAL

## 2020-10-10 MED ORDER — CEFAZOLIN SODIUM-DEXTROSE 2-3 GM-%(50ML) IV SOLR
INTRAVENOUS | Status: DC | PRN
  Administered 2020-10-10: 2 g via INTRAVENOUS

## 2020-10-10 MED ORDER — LACTATED RINGERS IV SOLN: INTRAVENOUS | Status: DC | PRN

## 2020-10-10 SURGICAL SUPPLY — 67 items
APL PRP STRL LF DISP 70% ISPRP (MISCELLANEOUS) ×2
BIPOLAR DEPUY 51 (Hips) ×2 IMPLANT
BIT DRILL 2.8X128 (BIT) ×2 IMPLANT
BLADE SAGITTAL 25.0X1.27X90 (BLADE) ×2 IMPLANT
BRUSH FEMORAL CANAL (MISCELLANEOUS) ×2 IMPLANT
CEMENT HV SMART SET (Cement) ×4 IMPLANT
CEMENT RESTRICTOR DEPUY SZ 3 (Cement) ×2 IMPLANT
CENTRALIZER STEM HIP 12MM (Hips) ×2 IMPLANT
CHLORAPREP W/TINT 26 (MISCELLANEOUS) ×4 IMPLANT
CLOTH BEACON ORANGE TIMEOUT ST (SAFETY) ×2 IMPLANT
COVER LIGHT HANDLE STERIS (MISCELLANEOUS) ×4 IMPLANT
COVER WAND RF STERILE (DRAPES) ×2 IMPLANT
DECANTER SPIKE VIAL GLASS SM (MISCELLANEOUS) ×2 IMPLANT
DRAPE HIP W/POCKET STRL (MISCELLANEOUS) ×2 IMPLANT
DRAPE SURG 17X23 STRL (DRAPES) ×2 IMPLANT
DRAPE U-SHAPE 47X51 STRL (DRAPES) ×2 IMPLANT
DRESSING AQUACEL AG ADV 3.5X12 (MISCELLANEOUS) ×1 IMPLANT
DRSG AQUACEL AG ADV 3.5X10 (GAUZE/BANDAGES/DRESSINGS) ×2 IMPLANT
DRSG AQUACEL AG ADV 3.5X12 (MISCELLANEOUS) ×2
DRSG MEPILEX BORDER 4X12 (GAUZE/BANDAGES/DRESSINGS) ×2 IMPLANT
DRSG MEPILEX SACRM 8.7X9.8 (GAUZE/BANDAGES/DRESSINGS) ×2 IMPLANT
ELECT REM PT RETURN 9FT ADLT (ELECTROSURGICAL) ×2
ELECTRODE REM PT RTRN 9FT ADLT (ELECTROSURGICAL) ×1 IMPLANT
GLOVE BIO SURGEON STRL SZ7 (GLOVE) ×2 IMPLANT
GLOVE BIOGEL PI IND STRL 7.0 (GLOVE) ×4 IMPLANT
GLOVE BIOGEL PI IND STRL 8 (GLOVE) ×1 IMPLANT
GLOVE BIOGEL PI INDICATOR 7.0 (GLOVE) ×4
GLOVE BIOGEL PI INDICATOR 8 (GLOVE) ×1
GLOVE SKINSENSE NS SZ8.0 LF (GLOVE) ×4
GLOVE SKINSENSE STRL SZ8.0 LF (GLOVE) ×4 IMPLANT
GOWN STRL REUS W/ TWL XL LVL3 (GOWN DISPOSABLE) ×1 IMPLANT
GOWN STRL REUS W/TWL LRG LVL3 (GOWN DISPOSABLE) ×6 IMPLANT
GOWN STRL REUS W/TWL XL LVL3 (GOWN DISPOSABLE) ×2
HANDPIECE INTERPULSE COAX TIP (DISPOSABLE) ×2
HEAD BIPOLAR DEPUY 51 (Hips) ×1 IMPLANT
HEAD FEM STD 28X+1.5 STRL (Hips) ×2 IMPLANT
INST SET MAJOR BONE (KITS) ×2 IMPLANT
IV NS IRRIG 3000ML ARTHROMATIC (IV SOLUTION) ×2 IMPLANT
KIT BLADEGUARD II DBL (SET/KITS/TRAYS/PACK) ×2 IMPLANT
KIT TURNOVER KIT A (KITS) ×2 IMPLANT
MANIFOLD NEPTUNE II (INSTRUMENTS) ×2 IMPLANT
MARKER SKIN DUAL TIP RULER LAB (MISCELLANEOUS) ×2 IMPLANT
NEEDLE HYPO 18GX1.5 BLUNT FILL (NEEDLE) ×2 IMPLANT
NEEDLE HYPO 21X1.5 SAFETY (NEEDLE) ×2 IMPLANT
NS IRRIG 1000ML POUR BTL (IV SOLUTION) ×2 IMPLANT
PACK SURGICAL SETUP 50X90 (CUSTOM PROCEDURE TRAY) ×2 IMPLANT
PACK TOTAL JOINT (CUSTOM PROCEDURE TRAY) ×2 IMPLANT
PAD ARMBOARD 7.5X6 YLW CONV (MISCELLANEOUS) ×2 IMPLANT
PASSER SUT SWANSON 36MM LOOP (INSTRUMENTS) IMPLANT
PENCIL SMOKE EVACUATOR (MISCELLANEOUS) ×2 IMPLANT
PILLOW ABDUCTION MEDIUM (MISCELLANEOUS) ×2 IMPLANT
SET BASIN LINEN APH (SET/KITS/TRAYS/PACK) ×2 IMPLANT
SET HNDPC FAN SPRY TIP SCT (DISPOSABLE) ×1 IMPLANT
STAPLER VISISTAT (STAPLE) ×2 IMPLANT
STEM SUMMIT CEMENTED BASIC (Hips) ×2 IMPLANT
STRIP CLOSURE SKIN 1/2X4 (GAUZE/BANDAGES/DRESSINGS) ×2 IMPLANT
SUT BRALON NAB BRD #1 30IN (SUTURE) ×4 IMPLANT
SUT MNCRL AB 4-0 PS2 18 (SUTURE) ×2 IMPLANT
SUT MON AB 2-0 CT1 36 (SUTURE) ×4 IMPLANT
SUT VIC AB 1 CT1 27 (SUTURE) ×12
SUT VIC AB 1 CT1 27XBRD ANTBC (SUTURE) ×6 IMPLANT
SYR 20ML LL LF (SYRINGE) ×6 IMPLANT
SYR BULB IRRIG 60ML STRL (SYRINGE) ×2 IMPLANT
TOWER CARTRIDGE SMART MIX (DISPOSABLE) ×2 IMPLANT
TRAY FOLEY MTR SLVR 16FR STAT (SET/KITS/TRAYS/PACK) ×2 IMPLANT
WATER STERILE IRR 1000ML POUR (IV SOLUTION) ×4 IMPLANT
YANKAUER SUCT 12FT TUBE ARGYLE (SUCTIONS) ×2 IMPLANT

## 2020-10-10 NOTE — Anesthesia Procedure Notes (Signed)
Date/Time: 10/10/2020 11:03 AM Performed by: Vista Deck, CRNA Pre-anesthesia Checklist: Patient identified, Emergency Drugs available, Suction available, Timeout performed and Patient being monitored Patient Re-evaluated:Patient Re-evaluated prior to induction Oxygen Delivery Method: Non-rebreather mask

## 2020-10-10 NOTE — Progress Notes (Signed)
Initial Nutrition Assessment  DOCUMENTATION CODES:      INTERVENTION:  When diet is resumed: start Ensure Enlive po daily BID (120 ml)   NUTRITION DIAGNOSIS:   Increased nutrient needs related to post-op healing as evidenced by estimated needs.  GOAL:   Patient will meet greater than or equal to 90% of their needs  MONITOR:   Diet advancement,Supplement acceptance,PO intake,Weight trends,Skin,I & O's  REASON FOR ASSESSMENT:   Consult Hip fracture protocol  ASSESSMENT: Patient is an 87 yo male who had a mechanical fall at home and suffered a displaced femoral neck fracture. He is undergoing right hip hemiarthroplasty today.  Patient is still in surgery /recovery. His spouse is in room and helped to provide nutrition-related background information. Home diet regular/chopped meats with gravy. Very good appetite and not a picky eater. Drinks ensure plus daily (4 oz -am/ 4 oz-pm). Feeds himself.   History of COPD (empysema chronic 3/L O2 at home), prostate cancer, hypothyroidism, GERD.   Patient weight has ranged between 51-57 kg the past 6-7 months. Patient spouse reports history of weight loss down to 112 lb. MD started Remeron. Patient appetite improved and has regained ~12 lb.   Labs reviewed: Sodium 133 (L), Potassium 4.2 wnl, BUN/Cr-wnl, Glucose 165 (H).   Medications reviewed and include: synthroid, Remeron, Protonix, solumedrol  NUTRITION - FOCUSED PHYSICAL EXAM: Patient not available.   Diet Order:   Diet Order            Diet NPO time specified  Diet effective midnight                 EDUCATION NEEDS:  Not appropriate for education at this time Skin:  Skin Assessment: Reviewed RN Assessment (surgical incision- right hip hemiarthroplasty)  Last BM:  unknown  Height:   Ht Readings from Last 1 Encounters:  10/09/20 5\' 5"  (1.651 m)    Weight:   Wt Readings from Last 1 Encounters:  10/09/20 56.7 kg    Ideal Body Weight:   57 kg  BMI:  Body mass  index is 20.8 kg/m.  Estimated Nutritional Needs:   Kcal:  3810-1751  Protein:  80-91 gr  Fluid:  >1700 ml daily   Colman Cater MS,RD,CSG,LDN Pager: Shea Evans

## 2020-10-10 NOTE — Addendum Note (Signed)
Addendum  created 10/10/20 1444 by Orlie Dakin, CRNA   Charge Capture section accepted

## 2020-10-10 NOTE — Anesthesia Postprocedure Evaluation (Signed)
Anesthesia Post Note  Patient: ERICSON NAFZIGER  Procedure(s) Performed: ARTHROPLASTY BIPOLAR HIP (HEMIARTHROPLASTY) (Right Hip)  Patient location during evaluation: PACU Anesthesia Type: Spinal and General Level of consciousness: awake Pain management: pain level controlled Vital Signs Assessment: post-procedure vital signs reviewed and stable Respiratory status: spontaneous breathing Cardiovascular status: blood pressure returned to baseline Postop Assessment: no headache Anesthetic complications: no   No complications documented.   Last Vitals:  Vitals:   10/10/20 1411 10/10/20 1415  BP:  95/66  Pulse: 84 88  Resp: 15 12  Temp:  36.6 C  SpO2: 99% 94%    Last Pain:  Vitals:   10/10/20 0827  TempSrc: Oral  PainSc:                  Louann Sjogren

## 2020-10-10 NOTE — Anesthesia Procedure Notes (Signed)
Spinal  Patient location during procedure: OR Start time: 10/10/2020 11:14 AM End time: 10/10/2020 11:20 AM Staffing Resident/CRNA: Vista Deck, CRNA Preanesthetic Checklist Completed: patient identified, IV checked, site marked, risks and benefits discussed, surgical consent, monitors and equipment checked, pre-op evaluation and timeout performed Spinal Block Patient position: right lateral decubitus Prep: Betadine Patient monitoring: heart rate, cardiac monitor, continuous pulse ox and blood pressure Approach: midline Location: L3-4 Injection technique: single-shot Needle Needle type: Pencan  Needle gauge: 24 G Needle length: 10 cm Assessment Sensory level: T6 Additional Notes Betadine prep x 3 1% lidocaine skin wheal 1 cc Clear CSF pre and post injection   ATTEMPTS: 1 TRAY ID: Melvern Sample [id number] TRAY EXPIRATION DATE: 2021-10-25

## 2020-10-10 NOTE — Progress Notes (Signed)
[facility] night shift.  The patient requested to the nursing staff to have zolpidem 5 mg at bedtime which he usually takes at home.  However, he also takes mirtazapine and his medications during this hospitalization include hydrocodone, lorazepam, methocarbamol and dextromethorphan.  I spoke to the staff after the request and the patient was already sleeping.  I will add another mirtazapine 7.5 mg p.o. nightly as needed x1 dose if the patient requires a sleeping aid later.  Will continue to monitor.  Tennis Must, MD.

## 2020-10-10 NOTE — Op Note (Signed)
Orthopaedic Surgery Operative Note (CSN: [id number])  John Black  1929/01/29 Date of Surgery: 10/10/2020   Diagnoses:  Right Hip Femoral Neck Fracture   Procedure: Right hip hemiarthroplasty   Operative Finding Successful completion of the planned procedure.      Post-Op Diagnosis: Same Surgeons:Primary: Mordecai Rasmussen, MD Assistants:  Marquita Palms  Location: [facility] OR ROOM 4 Anesthesia: Sedation plus regional anesthesia Antibiotics: Ancef 2 g Tourniquet time: * No tourniquets in log * Estimated Blood Loss: 50 Complications: None Specimens: None Implants: Implant Name Type Inv. Item Serial No. Manufacturer Lot No. LRB No. Used Action  CEMENT HV SMART SET - [id number] Cement CEMENT HV SMART SET  DEPUY SYNTHES [id number] Right 1 Implanted  CEMENT HV SMART SET - [id number] Cement CEMENT HV SMART SET  DEPUY SYNTHES [id number] Right 1 Implanted  HIP BALL ARTICU DEPUY - [id number] Hips HIP BALL ARTICU DEPUY  DEPUY ORTHOPAEDICS [id number] Right 1 Implanted  BIPOLAR DEPUY 51MM - [id number] Hips BIPOLAR DEPUY 51MM  DEPUY ORTHOPAEDICS [id number] Right 1 Implanted  CEMENT RESTRICTOR DEPUY SZ 3 - [id number] Cement CEMENT RESTRICTOR DEPUY SZ 3  DEPUY ORTHOPAEDICS [id number] Right 1 Implanted  STEM SUMMIT CEMENTED BASIC - [id number] Hips STEM SUMMIT CEMENTED BASIC  DEPUY ORTHOPAEDICS [id number] Right 1 Implanted  stem centralizer cemented    DEPUY ORTHOPAEDICS [id number] Right 1 Implanted    Indications for Surgery:   John Black is a 87 y.o. male who sustained a displaced right femoral neck fracture after a mechanical fall.  Benefits and risks of operative and nonoperative management were discussed prior to surgery with patient/guardian(s) and informed consent form was completed.  Specific risks including infection, need for additional surgery, bleeding, dislocation, persistent pain, damage to surrounding structures and more severe complications associated with anesthesia were discussed.  He elected to  proceed.    Procedure:   The patient was identified properly. Informed consent was obtained and the surgical site was marked. The patient was taken up to suite where general anesthesia was induced.  The patient was positioned lateral, right side up.  The right leg was prepped and draped in the usual sterile fashion.  Timeout was performed before the beginning of the case.  He received Ancef and TXA prior to making incision.   We made an incision centered over the greater trochanter with a scalpel. We used the scalpel to continue to dissect to the fascia. The fascia was pierced with Bovie electrocautery. Mayo scissors were used to cut the fascia in a longitudinal fashion. The gluteus maximus fibers were bluntly split in line with their fibers.   The femur was slowly internally rotated, putting tension on the posterior structures. Bovie electrocautery was used to dissect the short external rotators off of the insertion onto the femur. After the short external rotators were transected, we visualized the femoral neck fracture. We identified the sciatic nerve by palpation and verified that it was not in danger from dissection.   We made a T-shaped capsulotomy. We made a neck cut approximately 1 cm above the lesser trochanter with a a reciprocating saw. We removed the femoral head. We used the box cutter to cut away some of the greater trochanter for ease of insertion of the stem. We then used the canal finder to locate the femoral canal.   Using the angle of the femoral neck as our guide for version, we broached sequentially. We trialed components and found appropriate fit and stability.  The patient would come to full extension of  the hip. The hip was stable at 90 degrees of flexion and 45 degrees of internal rotation.  We then removed the trials as well as the broach. We ensured there were no foreign bodies or bone within the acetabulum. We copiously irrigated the wound.  Cement was mixed on the back  table.  We inserted a cement restrictor within the canal.  Once the cement was ready, it was introduced within the canal and pressurized.  We then carefully placed our stem.  Excess cement was removed.  After the cement had cured, we ensured there was no additional cement within the acetabulum.  We assembled the head and neck together onto the stem. We then reduced the hip. Again, that was stable in the previously mentioned manipulations.   We repaired the posterior capsule with individual #1 vicry sutures.  Short external rotators repaired to the greater trochanter to bone tunnels. We closed the fascia of the iliotibial band and gluteus maximus with running and intterupted Vicryl sutures. We then closed Scarpa's fascia with Vicryl sutures. Skin was closed with 2-0 and 4-0 Monocryl with Steri-Strips and Aquacel dressing was placed.  A hip abduction pillow was secured between his legs.    Post-operative plan:  The patient will be WBAT on the RLE.   Posterior hip precautions. Hip abduction pillow in place at all times while in bed.    DVT prophylaxis: recommend Aspirin 81 mg twice daily for 6 weeks.  To resume POD#1    Pain control with PRN pain medication preferring oral medicines.   Follow up plan will be scheduled in approximately 14 days for incision check and XR.

## 2020-10-10 NOTE — Anesthesia Preprocedure Evaluation (Addendum)
Anesthesia Evaluation  Patient identified by [name], date of birth, ID band Patient awake    Reviewed: Allergy & Precautions, H&P , NPO status , Patient's Chart, lab work & pertinent test results, reviewed documented beta blocker date and time   Airway Mallampati: II  TM Distance: >3 FB Neck ROM: full    Dental no notable dental hx.    Pulmonary pneumonia, resolved, COPD, former smoker,    Pulmonary exam normal breath sounds clear to auscultation       Cardiovascular Exercise Tolerance: Good negative cardio ROS   Rhythm:regular Rate:Normal     Neuro/Psych negative neurological ROS  negative psych ROS   GI/Hepatic negative GI ROS, Neg liver ROS,   Endo/Other  Hypothyroidism   Renal/GU negative Renal ROS  negative genitourinary   Musculoskeletal   Abdominal   Peds  Hematology negative hematology ROS (+)   Anesthesia Other Findings   Reproductive/Obstetrics negative OB ROS                             Anesthesia Physical Anesthesia Plan  ASA: II  Anesthesia Plan: Spinal   Post-op Pain Management:    Induction:   PONV Risk Score and Plan:   Airway Management Planned:   Additional Equipment:   Intra-op Plan:   Post-operative Plan:   Informed Consent: I have reviewed the patients History and Physical, chart, labs and discussed the procedure including the risks, benefits and alternatives for the proposed anesthesia with the patient or authorized representative who has indicated his/her understanding and acceptance.     Dental Advisory Given  Plan Discussed with: CRNA  Anesthesia Plan Comments:        Anesthesia Quick Evaluation

## 2020-10-10 NOTE — Transfer of Care (Signed)
Immediate Anesthesia Transfer of Care Note  Patient: JARMAN LITTON  Procedure(s) Performed: ARTHROPLASTY BIPOLAR HIP (HEMIARTHROPLASTY) (Right Hip)  Patient Location: PACU  Anesthesia Type:MAC and Spinal  Level of Consciousness: drowsy and patient cooperative  Airway & Oxygen Therapy: Patient Spontanous Breathing and Patient connected to nasal cannula oxygen  Post-op Assessment: Report given to RN and Post -op Vital signs reviewed and stable  Post vital signs: Reviewed and stable  Last Vitals:  Vitals Value Taken Time  BP 95/66 10/10/20 1415  Temp    Pulse 85 10/10/20 1416  Resp 12 10/10/20 1416  SpO2 91 % 10/10/20 1416  Vitals shown include unvalidated device data.  Last Pain:  Vitals:   10/10/20 0827  TempSrc: Oral  PainSc:          Complications: No complications documented.

## 2020-10-10 NOTE — Clinical Social Work Note (Signed)
Attempted contact with spouse, no answer. Unable to leave VM. TOC will attempt contact later.     Tamsen Reist, Clydene Pugh, LCSW

## 2020-10-10 NOTE — Progress Notes (Signed)
PROGRESS NOTE    John Black  [id number] DOB: [birth year] DOA: 10/09/2020 PCP: Alycia Rossetti, MD   Chief Complaint  Patient presents with  . Hip Pain    Right hip pain following fall at home    Brief Narrative:  John Black is a 87 y.o. male with medical history significant of chronic respiratory failure with hypoxia secondary to COPD, history of BPH, history of prostate cancer, hypothyroidism, depression/anxiety and gastroesophageal reflux disease; who presented to the hospital following mechanical fall landing on her right side with inability to bear weight and with intractable pain.  Patient reports pain to be dull in nature 6 out of 10, worse with any range of motion in his right leg or if he tries to put weight on it.  No chest pain, no fever, no nausea, no vomiting, no abdominal pain, reports breathing at baseline 14, no dysuria, no hematuria, no melena, no hematochezia.  Patient denies sick contacts.  ED Course: Chest x-ray without acute cardiopulmonary process; right hip images demonstrating acute right femoral neck fracture; pending COVID test; mild leukocytosis with WBCs of 13.0 and a stable basic metabolic panel.  Orthopedic service has been consulted and recommendations given for admission to pursue surgical repair.  TRH has been called to facilitate patient's placement and further management.  Review of Systems: As per HPI otherwise all other systems reviewed and are negative.  Assessment & Plan: 1-Acute right hip fracture -Continue to follow recommendations by orthopedic service -Plan is for surgical repair later today -Continue as needed analgesics. -Nonweightbearing currently. -Final evaluation after surgery by physical therapy/Occupational Therapy to determine appropriate discharge pathway  2-chronic respiratory failure with hypoxia secondary to COPD -Complete anticipated doses of Sinemet prior to resuming chronic prednisone -Will also continue 4  L nasal cannula supplementation nightly dose at home and use as needed bronchodilators. -continue home Fluticasone-vilanterol inhaler.   3-hypothyroidism -Continue Synthroid.  4-Hx of his reflux disease -Continue PPI.  5-continue Flomax.  6-depression/anxiety -Continue the use of Remeron and as needed Ativan -Stable mood.  7-right shoulder pain -No abnormalities appreciated on x-ray at time of admission -Continue as needed analgesia -Will follow recommendation by PT.  DVT prophylaxis: Heparin Code Status: Full code Family Communication: Wife at bedside Disposition:   Status is: Inpatient  Dispo: The patient is from: Home              Anticipated d/c is to: To be determined              Anticipated d/c date is: 1-2 days              Patient currently No medically stable; patient in need of surgical repair of his right hip fracture.     Consultants:   Orthopedic service   Procedures:  See below for x-ray report.   Antimicrobials:  None   Subjective: Afebrile, no chest pain, no nausea, no vomiting.  Reports improvement in his breathing and feeling ready for surgical repair.  Continues to have pain on his leg.  Objective: Vitals:   10/10/20 1515 10/10/20 1517 10/10/20 1530 10/10/20 1551  BP: 116/77 116/77 122/79 115/68  Pulse: 99 99 94 100  Resp: (!) 23 (!) 23 18 18   Temp:    (!) 97.5 F (36.4 C)  TempSrc:    Oral  SpO2: 93% 93% 94% 94%  Weight:      Height:        Intake/Output Summary (Last 24 hours) at 10/10/2020 1640  Last data filed at 10/10/2020 1500 Gross per 24 hour  Intake 1450 ml  Output 550 ml  Net 900 ml   Filed Weights   10/09/20 0629  Weight: 56.7 kg    Examination:  General exam: No chest pain, no nausea, no vomiting.  Still reporting right hip pain with any movement.  Patient was taking his time but demonstrating improvement in his respiratory status and ability to speak in full sentences. Respiratory system: Improved air  movement; positive scattered rhonchi.  Mild expiratory wheezing no using accessory muscle. Cardiovascular system: Rate controlled, no rubs, no gallops, no JVD on exam. Gastrointestinal system: Abdomen is nondistended, soft and nontender. No organomegaly or masses felt. Normal bowel sounds heard. Central nervous system: No focal neurological deficits. Extremities: No cyanosis or clubbing. Skin: No petechiae. Psychiatry: Mood & affect appropriate.     Data Reviewed: I have personally reviewed following labs and imaging studies  CBC: Recent Labs  Lab 10/09/20 0800 10/10/20 0708  WBC 13.0* 11.9*  NEUTROABS 10.8*  --   HGB 12.9* 13.9  HCT 40.7 43.0  MCV 94.7 93.3  PLT 286 169    Basic Metabolic Panel: Recent Labs  Lab 10/09/20 0800 10/10/20 0708  NA 135 133*  K 3.9 4.2  CL 97* 94*  CO2 30 28  GLUCOSE 115* 165*  BUN 25* 17  CREATININE 0.85 0.80  CALCIUM 9.1 9.0    GFR: Estimated Creatinine Clearance: 50.2 mL/min (by C-G formula based on SCr of 0.8 mg/dL).   Recent Results (from the past 240 hour(s))  SARS CORONAVIRUS 2 (TAT 6-24 HRS) Nasopharyngeal Nasopharyngeal Swab     Status: None   Collection Time: 10/09/20  8:05 AM   Specimen: Nasopharyngeal Swab  Result Value Ref Range Status   SARS Coronavirus 2 NEGATIVE NEGATIVE Final    Comment: (NOTE) SARS-CoV-2 target nucleic acids are NOT DETECTED.  The SARS-CoV-2 RNA is generally detectable in upper and lower respiratory specimens during the acute phase of infection. Negative results do not preclude SARS-CoV-2 infection, do not rule out co-infections with other pathogens, and should not be used as the sole basis for treatment or other patient management decisions. Negative results must be combined with clinical observations, patient history, and epidemiological information. The expected result is Negative.  Fact Sheet for Patients: SugarRoll.be  Fact Sheet for Healthcare  Providers: https://www.woods-mathews.com/  This test is not yet approved or cleared by the [place] FDA and  has been authorized for detection and/or diagnosis of SARS-CoV-2 by FDA under an Emergency Use Authorization (EUA). This EUA will remain  in effect (meaning this test can be used) for the duration of the COVID-19 declaration under Se ction 564(b)(1) of the Act, 21 U.S.C. section 360bbb-3(b)(1), unless the authorization is terminated or revoked sooner.  Performed at [facility], [place] [address], [place], [place] [address]      Radiology Studies: DG Shoulder Right  Result Date: 10/09/2020 CLINICAL DATA:  Status post fall with right arm pain. EXAM: RIGHT SHOULDER - 2+ VIEW COMPARISON:  Chest x-ray October 09, 2020 FINDINGS: There is no evidence of fracture or dislocation. Degenerative joint changes of the right shoulder identified. Chronic scar of the right apex is noted. IMPRESSION: No acute fracture or dislocation. Electronically Signed   By: Abelardo Diesel M.D.   On: 10/09/2020 15:44   Chest Portable 1 View  Result Date: 10/09/2020 CLINICAL DATA:  Preop for surgery. EXAM: PORTABLE CHEST 1 VIEW COMPARISON:  September 08, 2020 FINDINGS: The  heart size and mediastinal contours are within normal limits. Scarring of the right upper lobe is unchanged. Emphysematous changes are stable. The visualized skeletal structures are unremarkable. IMPRESSION: No active cardiopulmonary disease identified. Electronically Signed   By: Abelardo Diesel M.D.   On: 10/09/2020 13:47   DG Hip Unilat W or Wo Pelvis 2-3 Views Right  Result Date: 10/09/2020 CLINICAL DATA:  Status post fall.  Right hip pain. EXAM: DG HIP (WITH OR WITHOUT PELVIS) 2-3V RIGHT COMPARISON:  CT abdomen pelvis Feb 04, 2020 FINDINGS: There is a foreshortened fracture through the right femoral neck. Mild right hip joint degenerative changes. Vascular calcifications. IMPRESSION: Right femoral neck fracture.  Electronically Signed   By: Lovey Newcomer M.D.   On: 10/09/2020 07:33   Scheduled Meds: . aspirin EC  81 mg Oral Daily  . Chlorhexidine Gluconate Cloth  6 each Topical Daily  . dextromethorphan-guaiFENesin  1 tablet Oral BID  . fluticasone furoate-vilanterol  1 puff Inhalation Daily  . heparin injection (subcutaneous)  5,000 Units Subcutaneous Q8H  . levothyroxine  75 mcg Oral QAC breakfast  . methylPREDNISolone (SOLU-MEDROL) injection  60 mg Intravenous Q12H  . mirtazapine  7.5 mg Oral QHS  . pantoprazole  40 mg Oral Daily  . tamsulosin  0.4 mg Oral Daily  . umeclidinium bromide  1 puff Inhalation Daily   Continuous Infusions: .  ceFAZolin (ANCEF) IV    . methocarbamol (ROBAXIN) IV       LOS: 1 day    Time spent: 30 minutes    Barton Dubois, MD [facility]   To contact the attending provider between 7A-7P or the covering provider during after hours 7P-7A, please log into the web site [email or web address] and access using universal [place] password for that web site. If you do not have the password, please call the hospital operator.  10/10/2020, 4:40 PM

## 2020-10-11 ENCOUNTER — Telehealth: Payer: Self-pay | Admitting: Internal Medicine

## 2020-10-11 ENCOUNTER — Encounter (HOSPITAL_COMMUNITY): Payer: Self-pay | Admitting: Orthopedic Surgery

## 2020-10-11 DIAGNOSIS — N400 Enlarged prostate without lower urinary tract symptoms: Secondary | ICD-10-CM

## 2020-10-11 DIAGNOSIS — J449 Chronic obstructive pulmonary disease, unspecified: Secondary | ICD-10-CM

## 2020-10-11 DIAGNOSIS — J9611 Chronic respiratory failure with hypoxia: Secondary | ICD-10-CM

## 2020-10-11 DIAGNOSIS — S72001D Fracture of unspecified part of neck of right femur, subsequent encounter for closed fracture with routine healing: Secondary | ICD-10-CM

## 2020-10-11 MED ORDER — LORAZEPAM 0.5 MG PO TABS
0.5000 mg | ORAL_TABLET | ORAL | Status: AC
  Administered 2020-10-11: 0.5 mg via ORAL
  Filled 2020-10-11 (×2): qty 1

## 2020-10-11 MED ORDER — PREDNISONE 10 MG PO TABS
10.0000 mg | ORAL_TABLET | Freq: Every day | ORAL | Status: DC
  Administered 2020-10-11 – 2020-10-12 (×2): 10 mg via ORAL
  Filled 2020-10-11 (×2): qty 1

## 2020-10-11 NOTE — Telephone Encounter (Signed)
Will forward to Dr. Wert as FYI.  

## 2020-10-11 NOTE — Plan of Care (Signed)
  Problem: Acute Rehab OT Goals (only OT should resolve) Goal: Pt. Will Perform Eating 10/11/2020 0900 by Larey Seat, OT Flowsheets (Taken 10/11/2020 0900) Pt Will Perform Eating:  with modified independence  sitting 10/11/2020 0859 by Larey Seat, OT Flowsheets (Taken 10/11/2020 [phone]) Pt Will Perform Eating:  with modified independence  sitting Goal: Pt. Will Perform Grooming Flowsheets (Taken 10/11/2020 0900) Pt Will Perform Grooming:  with modified independence  sitting  Independently Goal: Pt. Will Perform Upper Body Dressing Flowsheets (Taken 10/11/2020 0900) Pt Will Perform Upper Body Dressing:  with modified independence  sitting Goal: Pt. Will Perform Lower Body Dressing Flowsheets (Taken 10/11/2020 0900) Pt Will Perform Lower Body Dressing:  with set-up  bed level  with adaptive equipment  sitting/lateral leans  with supervision Goal: Pt. Will Transfer To Toilet Flowsheets (Taken 10/11/2020 0900) Pt Will Transfer to Toilet:  with supervision  stand pivot transfer Goal: Pt/Caregiver Will Perform Home Exercise Program Flowsheets (Taken 10/11/2020 0900) Pt/caregiver will Perform Home Exercise Program:  Increased strength  With written HEP provided  Both right and left upper extremity

## 2020-10-11 NOTE — Evaluation (Signed)
Physical Therapy Evaluation Patient Details Name: John Black [name]: [id number] DOB: 01/06/1929 Today's Date: 10/11/2020   History of Present Illness  John Black is a 87 y.o. male with medical history significant of chronic respiratory failure with hypoxia secondary to COPD, history of BPH, history of prostate cancer, hypothyroidism, depression/anxiety and gastroesophageal reflux disease; who presented to the hospital following mechanical fall landing on her right side with inability to bear weight and with intractable pain.  Patient reports pain to be dull in nature 6 out of 10, worse with any range of motion in his right leg or if he tries to put weight on it.  No chest pain, no fever, no nausea, no vomiting, no abdominal pain, reports breathing at baseline 14, no dysuria, no hematuria, no melena, no hematochezia.  Patient denies sick contacts.    Clinical Impression  Patient presents seated in chair (assisted by OT staff) and request to walk to bathroom.  Patient demonstrates slow labored movement, very unsteady on feet and limited to a few steps before having to sit mostly due to severe SOB with SpO2 dropping to 89% while on 4 LPM O2, HR up to 140.  Patient had to rest 3-4 minutes before attempting to ambulate to bathroom, but unable to make and required use of BSC to have bowel movement.  Patient put back to bed after therapy with SpO2 at 92% and HR at 68 BPM while on 4 LPM O2.  Patient will benefit from continued physical therapy in hospital and recommended venue below to increase strength, balance, endurance for safe ADLs and gait.    Follow Up Recommendations SNF;Supervision for mobility/OOB;Supervision - Intermittent    Equipment Recommendations  None recommended by PT    Recommendations for Other Services       Precautions / Restrictions Precautions Precautions: Posterior Hip;Fall Precaution Booklet Issued: No Precaution Comments: taped precautions on board in  room. Restrictions Weight Bearing Restrictions: Yes RLE Weight Bearing: Weight bearing as tolerated Other Position/Activity Restrictions: posterior hip      Mobility  Bed Mobility Overal bed mobility: Needs Assistance Bed Mobility: Sit to Supine     Supine to sit: Min guard Sit to supine: Mod assist;Min assist   General bed mobility comments: requires assistance to move RLE back onto bed    Transfers Overall transfer level: Needs assistance Equipment used: Rolling walker (2 wheeled) Transfers: Sit to/from [facility] Sit to Stand: Min assist Stand pivot transfers: Mod assist       General transfer comment: once fatigued very unsteady on feet with poor tolerance for taking steps due to weakness and RLE pain  Ambulation/Gait Ambulation/Gait assistance: Mod assist;Max assist Gait Distance (Feet): 4 Feet Assistive device: Rolling walker (2 wheeled) Gait Pattern/deviations: Decreased step length - right;Decreased step length - left;Decreased stance time - right;Decreased stride length;Antalgic Gait velocity: slow   General Gait Details: very unsteady on feet, limited mostly due to SOB with SpO2 dropping to 89% while on 4 LPM, unable to ambulate to bathroom, had to use [facility]  Stairs            Wheelchair Mobility    Modified Rankin (Stroke Patients Only)       Balance Overall balance assessment: Needs assistance Sitting-balance support: Feet supported;No upper extremity supported Sitting balance-Leahy Scale: Fair Sitting balance - Comments: fair/good seated at EOB   Standing balance support: During functional activity;Bilateral upper extremity supported Standing balance-Leahy Scale: Poor Standing balance comment: fair/poor using RW  Pertinent Vitals/Pain Pain Assessment: 0-10 Pain Score: 3  Pain Location: R hip Pain Descriptors / Indicators: Sore;Grimacing Pain Intervention(s): Limited activity within  patient's tolerance;Monitored during session;Repositioned    Home Living Family/patient expects to be discharged to:: Private residence Living Arrangements: Spouse/significant other;Children Available Help at Discharge: Family Type of Home: House Home Access: Level entry     Home Layout: Multi-level Home Equipment: Environmental consultant - 2 wheels;Walker - 4 wheels;Cane - single point;Shower seat - built in;Hand held shower head;Grab bars - tub/shower;Transport chair Additional Comments: pt typically on 4L [place]    Prior Function Level of Independence: Needs assistance   Gait / Transfers Assistance Needed: pt ambulates independently in the home, utilizes rollator for community mobility  ADL's / Homemaking Assistance Needed: Occasional assist with ADLs on "bad" days. Typically patient is Mod I with bathing/dressing/toileting. Wears incontinence briefs and reports ~5 episodes of urinary incontinence daily.  Comments: Pt reported level of independence prior to fall.     Hand Dominance   Dominant Hand: Right    Extremity/Trunk Assessment   Upper Extremity Assessment Upper Extremity Assessment: Defer to OT evaluation    Lower Extremity Assessment Lower Extremity Assessment: Generalized weakness;RLE deficits/detail;LLE deficits/detail RLE Deficits / Details: grossly 3+/5 RLE: Unable to fully assess due to pain RLE Sensation: WNL RLE Coordination: WNL LLE Deficits / Details: grossly 4+/5 LLE Sensation: WNL LLE Coordination: WNL    Cervical / Trunk Assessment Cervical / Trunk Assessment: Normal  Communication   Communication: No difficulties  Cognition Arousal/Alertness: Awake/alert Behavior During Therapy: WFL for tasks assessed/performed;Impulsive;Anxious Overall Cognitive Status: Within Functional Limits for tasks assessed                                 General Comments: Patient slightly anxious and impulsive especially when SOB      General Comments      Exercises      Assessment/Plan    PT Assessment Patient needs continued PT services  PT Problem List Decreased strength;Decreased activity tolerance;Decreased balance;Decreased mobility       PT Treatment Interventions DME instruction;Gait training;Stair training;Functional mobility training;Therapeutic activities;Therapeutic exercise;Patient/family education;Balance training    PT Goals (Current goals can be found in the Care Plan section)  Acute Rehab PT Goals Patient Stated Goal: return home with family to assist PT Goal Formulation: With patient/family Time For Goal Achievement: 10/25/20 Potential to Achieve Goals: Good    Frequency Min 4X/week   Barriers to discharge        Co-evaluation               AM-PAC PT "6 Clicks" Mobility  Outcome Measure Help needed turning from your back to your side while in a flat bed without using bedrails?: A Lot Help needed moving from lying on your back to sitting on the side of a flat bed without using bedrails?: A Lot Help needed moving to and from a bed to a chair (including a wheelchair)?: A Lot Help needed standing up from a chair using your arms (e.g., wheelchair or bedside chair)?: A Lot Help needed to walk in hospital room?: A Lot Help needed climbing 3-5 steps with a railing? : Total 6 Click Score: 11    End of Session Equipment Utilized During Treatment: Oxygen Activity Tolerance: Patient tolerated treatment well;Patient limited by fatigue Patient left: in bed;with call bell/phone within reach Nurse Communication: Mobility status PT Visit Diagnosis: Unsteadiness on feet (R26.81);Other abnormalities of gait  and mobility (R26.89);Muscle weakness (generalized) (M62.81)    Time: [id number] PT Time Calculation (min) (ACUTE ONLY): 34 min   Charges:   PT Evaluation $PT Eval Moderate Complexity: 1 Mod PT Treatments $Therapeutic Activity: 23-37 mins        10:59 AM, 10/11/20 Lonell Grandchild, MPT Physical Therapist with  [facility] [phone] office [phone] mobile phone

## 2020-10-11 NOTE — Consult Note (Signed)
NAME:  John Black, [name]:  [id number], DOB:  December 08, 1928, LOS: 2 ADMISSION DATE:  10/09/2020, CONSULTATION DATE:  10/11/20 REFERRING MD:  Roderic Palau, [facility], CHIEF COMPLAINT:    Brief History:  13 yowm quit smoking in 2010 then major aspiration event? MRSA then dx with GOLD II copd as of 2015 and maint on trelegy / 4lpm 24/7   History of Present Illness:  87 y.o. male with medical history significant of chronic respiratory failure with hypoxia secondary to COPD, history of BPH, history of prostate cancer, hypothyroidism, depression/anxiety and gastroesophageal reflux disease; who presented to the hospital following mechanical fall landing on her right side with inability to bear weight and with intractable pain.  Patient reports pain to be dull in nature 6 out of 10, worse with any range of motion in his right leg or if he tries to put weight on it.     ED Course: Chest x-ray without acute cardiopulmonary process; right hip images demonstrating acute right femoral neck fracture; pending COVID test; mild leukocytosis with WBCs of 13.0.  No obvious day to day or daytime variability to doe or assoc excess/ purulent sputum or mucus plugs or hemoptysis or cp or chest tightness, subjective wheeze or overt sinus or hb symptoms.   Sleeping at baseline  without nocturnal  or early am exacerbation  of respiratory  c/o's or need for noct saba. Also denies any obvious fluctuation of symptoms with weather or environmental changes or other aggravating or alleviating factors except as outlined above   No unusual exposure hx or h/o childhood pna/ asthma or knowledge of premature birth.  Current Allergies, Complete Past Medical History, Past Surgical History, Family History, and Social History were reviewed in [employer] record.  ROS  The following are not active complaints unless bolded Hoarseness, sore throat, dysphagia, dental problems, itching, sneezing,  nasal congestion/dryness at  home on dry 02  or discharge of excess mucus or purulent secretions, ear ache,   fever, chills, sweats, unintended wt loss or wt gain, classically pleuritic or exertional cp,  orthopnea pnd or arm/hand swelling  or leg swelling, presyncope, palpitations, abdominal pain, anorexia, nausea, vomiting, diarrhea  or change in bowel habits or change in bladder habits, change in stools or change in urine, dysuria, hematuria,  rash, arthralgias, visual complaints, headache, numbness, weakness or ataxia or problems with walking or coordination,  change in mood or  memory.               Past Medical History:   Past Medical History:  Diagnosis Date  . Allergic rhinitis   . Cataract    s/p removal  . Chronic respiratory failure (HCC)    oxygen 3L at home  . Emphysema   . Hypothyroid   . PNA (pneumonia)   . Prostate cancer ([place])      Significant Hospital Events:     Consults:  Ortho 1/23 PCCM 1/25   Procedures:  Right hip hemiarthroplasty 1@4    Significant Diagnostic Tests:    Micro Data:  Covid 19 PCR  1/23  neg  Antimicrobials:  Ancef 1/23 >>>    Scheduled Meds: . aspirin EC  81 mg Oral Daily  . Chlorhexidine Gluconate Cloth  6 each Topical Daily  . dextromethorphan-guaiFENesin  1 tablet Oral BID  . fluticasone furoate-vilanterol  1 puff Inhalation Daily  . heparin injection (subcutaneous)  5,000 Units Subcutaneous Q8H  . levothyroxine  75 mcg Oral QAC breakfast  . mirtazapine  7.5 mg  Oral QHS  . pantoprazole  40 mg Oral Daily  . predniSONE  10 mg Oral Q breakfast  . tamsulosin  0.4 mg Oral Daily  . umeclidinium bromide  1 puff Inhalation Daily   Continuous Infusions: . methocarbamol (ROBAXIN) IV     PRN Meds:.albuterol, fentaNYL (SUBLIMAZE) injection, HYDROcodone-acetaminophen, LORazepam, methocarbamol **OR** methocarbamol (ROBAXIN) IV, mirtazapine, polyethylene glycol, polyvinyl alcohol  Interim History / Subjective:  Comfortable pos op, very upbeat affect    Objective   Blood pressure 124/88, pulse 60, temperature 98.4 F (36.9 C), temperature source Oral, resp. rate 17, height 5\' 5"  (1.651 m), weight 56.7 kg, SpO2 92 %.        Intake/Output Summary (Last 24 hours) at 10/11/2020 1108 Last data filed at 10/11/2020 0900 Gross per 24 hour  Intake 1981.39 ml  Output 550 ml  Net 1431.39 ml   Filed Weights   10/09/20 0629  Weight: 56.7 kg    Examination: General: elderly wm nad with sats  Low to Mid 90s on 4lpm NP  Tmax 98.6  HENT: edentulous Lungs: distant bs/ barrel chest Cardiovascular: RRR distant S1S2 Abdomen: soft/ benign Extremities: warm, no calf tenderness or edema Neuro: approp affect, sensorium and no motor def apparent    I personally reviewed images and agree with radiology impression as follows:  CXR:   Portable 1/23 Emphysematous changes are stable No active cardiopulmonary disease identified.     Assessment & Plan:    1)  COPD GOLD 3/ Group D maint on trelegy/  Prednisone 10 mg daily  at home and well compensated  >>>no changes needed as inpt or at d/c (on the equivalent of trelegy at present which is fine as long as doesn't get confused with names of meds at d/c)    2) Chronic Resp failure with hypoxemia/ 02 dep at baseline at 4lpm with borerline hypercabia >>> needs humidied 02 at d/c     Labs   CBC: Recent Labs  Lab 10/09/20 0800 10/10/20 0708  WBC 13.0* 11.9*  NEUTROABS 10.8*  --   HGB 12.9* 13.9  HCT 40.7 43.0  MCV 94.7 93.3  PLT 286 809    Basic Metabolic Panel: Recent Labs  Lab 10/09/20 0800 10/10/20 0708  NA 135 133*  K 3.9 4.2  CL 97* 94*  CO2 30 28  GLUCOSE 115* 165*  BUN 25* 17  CREATININE 0.85 0.80  CALCIUM 9.1 9.0   GFR: Estimated Creatinine Clearance: 50.2 mL/min (by C-G formula based on SCr of 0.8 mg/dL). Recent Labs  Lab 10/09/20 0800 10/10/20 0708  WBC 13.0* 11.9*    Liver Function Tests: No results for input(s): AST, ALT, ALKPHOS, BILITOT, PROT, ALBUMIN in  the last 168 hours. No results for input(s): LIPASE, AMYLASE in the last 168 hours. No results for input(s): AMMONIA in the last 168 hours.  ABG    Component Value Date/Time   PHART 7.402 (H) 02/16/2020 2318   PCO2ART 44.9 02/16/2020 2318   PO2ART 85 02/16/2020 2318   HCO3 27.9 (H) 02/16/2020 2318   TCO2 29 02/16/2020 2318   O2SAT 96.0 02/16/2020 2318     Coagulation Profile: Recent Labs  Lab 10/09/20 0800  INR 1.0    Cardiac Enzymes: No results for input(s): CKTOTAL, CKMB, CKMBINDEX, TROPONINI in the last 168 hours.  HbA1C: Hgb A1c MFr Bld  Date/Time Value Ref Range Status  09/09/2020 08:00 AM 5.7 (H) 4.8 - 5.6 % Final    Comment:    (NOTE) Pre diabetes:  5.7%-6.4%  Diabetes:              >6.4%  Glycemic control for   <7.0% adults with diabetes   09/09/2020 04:03 AM 5.9 (H) 4.8 - 5.6 % Final    Comment:    (NOTE) Pre diabetes:          5.7%-6.4%  Diabetes:              >6.4%  Glycemic control for   <7.0% adults with diabetes     CBG: No results for input(s): GLUCAP in the last 168 hours.     Past Medical History:  He,  has a past medical history of Allergic rhinitis, Cataract, Chronic respiratory failure (HCC), Emphysema, Hypothyroid, PNA (pneumonia), and Prostate cancer (HCC).   Surgical History:   Past Surgical History:  Procedure Laterality Date  . ADENOIDECTOMY    . ROTATOR CUFF REPAIR  1990,2009   bilateral  . Seed implant for prostate cancer  2000  . TONSILLECTOMY    . TRANSURETHRAL RESECTION OF PROSTATE  2001   x2     Social History:   reports that he quit smoking about 12 years ago. His smoking use included cigarettes. He has a 75.00 pack-year smoking history. He has never used smokeless tobacco. He reports current alcohol use of about 1.0 standard drink of alcohol per week. He reports that he does not use drugs.   Family History:  His family history includes Heart disease in his mother; Prostate cancer in his father.    Allergies Allergies  Allergen Reactions  . Morphine     REACTION: sweats     Home Medications  Prior to Admission medications   Medication Sig Start Date End Date Taking? Authorizing Provider  acetaminophen (TYLENOL) 325 MG tablet Take 650 mg by mouth every 6 (six) hours as needed for mild pain.   Yes [provider]  aspirin EC 81 MG EC tablet Take 1 tablet (81 mg total) by mouth daily. Swallow whole. 09/10/20  Yes Rai, Ripudeep K, MD  calcium gluconate 500 MG tablet Take 500 tablets by mouth daily.   Yes [provider]  Fluticasone-Umeclidin-Vilant (TRELEGY ELLIPTA) 100-62.5-25 MCG/INH AEPB Inhale 1 puff into the lungs daily. 09/30/20  Yes Nyoka Cowden, MD  guaiFENesin (MUCINEX) 600 MG 12 hr tablet Take 600 mg by mouth 2 (two) times daily as needed for cough.   Yes [provider]  levothyroxine (SYNTHROID) 75 MCG tablet TAKE 1 TABLET (75 MCG TOTAL) BY MOUTH DAILY. Patient taking differently: Take 75 mcg by mouth daily before breakfast. 04/07/20  Yes [place], Velna Hatchet, MD  LORazepam (ATIVAN) 0.5 MG tablet TAKE 1 TABLET (0.5 MG TOTAL) BY MOUTH 2 (TWO) TIMES DAILY AS NEEDED. Patient taking differently: Take 0.5 mg by mouth 2 (two) times daily as needed for anxiety. 05/24/20  Yes [place], Velna Hatchet, MD  mirtazapine (REMERON) 15 MG tablet TAKE 1/2 TABLET BY MOUTH IN THE EVENING Patient taking differently: Take 7.5 mg by mouth at bedtime. 09/21/20  Yes [place], Velna Hatchet, MD  pantoprazole (PROTONIX) 40 MG tablet TAKE 1 TABLET BY MOUTH EVERY DAY Patient taking differently: Take 40 mg by mouth daily. 04/13/20  Yes Bevelyn Ngo, NP  polyvinyl alcohol (LIQUIFILM TEARS) 1.4 % ophthalmic solution Place 1 drop into both eyes as needed for dry eyes.   Yes [provider]  predniSONE (DELTASONE) 10 MG tablet Take 1 tablet (10 mg total) by mouth daily with breakfast. HOLD while on Prednisone taper  09/09/20  Yes Rai, Ripudeep K, MD  tamsulosin (FLOMAX) 0.4 MG CAPS  capsule TAKE 1 CAPSULE BY MOUTH EVERY DAY Patient taking differently: Take 0.4 mg by mouth daily. 08/08/20  Yes [place], Modena Nunnery, MD  traMADol (ULTRAM) 50 MG tablet TAKE 1 TABLET (50 MG TOTAL) BY MOUTH EVERY 8 (EIGHT) HOURS AS NEEDED. FOR PAIN Patient taking differently: Take 50 mg by mouth every 8 (eight) hours as needed for moderate pain. 04/25/20  Yes [place], Modena Nunnery, MD  zolpidem (AMBIEN) 5 MG tablet TAKE 1 TABLET BY MOUTH EVERY DAY AT BEDTIME AS NEEDED FOR SLEEP Patient taking differently: Take 5 mg by mouth at bedtime. 08/08/20  Yes [place], Modena Nunnery, MD  Menthol, Topical Analgesic, (GOLD BOND ORIGINAL STRENGTH EX) Apply 1 application topically as needed (irritation).    [provider]  menthol-zinc oxide (GOLD BOND) powder Apply 1 application topically See admin instructions. After showers  And as needed for irritation    [provider]  Respiratory Therapy Supplies (FLUTTER) DEVI Use as directed. 08/12/19   [place], Fonnie Mu, NP        Christinia Gully, MD [facility] [place] [phone]   After 7:00 pm call Elink  [phone]

## 2020-10-11 NOTE — NC FL2 (Signed)
[place] MEDICAID FL2 LEVEL OF CARE SCREENING TOOL     IDENTIFICATION  Patient Name: John Black Birthdate: August 10, 1929 Sex: male Admission Date (Current Location): 10/09/2020  [facility] and [place] Number:  [employer] and Address:  [facility] [address] [place]      Provider Number: [id number]  Attending Physician Name and Address:  Kathie Dike, MD  Relative Name and Phone Number:       Current Level of Care: Hospital Recommended Level of Care: [facility] Prior Approval Number:    Date Approved/Denied:   PASRR Number:    Discharge Plan: SNF    Current Diagnoses: Patient Active Problem List   Diagnosis Date Noted  . Closed right hip fracture ([place]) 10/09/2020  . Dysphagia 07/24/2019  . Protein calorie malnutrition ([place]) 02/24/2019  . Gait instability 01/27/2019  . Right inguinal hernia   . Chronic diarrhea 12/06/2017  . Inguinal hernia 03/02/2014  . Constipation 03/02/2014  . Chronic respiratory failure with hypoxia ([place]) 12/09/2013  . Hemorrhoids 09/28/2013  . Hyperlipidemia 06/28/2013  . Sinusitis, chronic 11/28/2012  . COPD with acute exacerbation ([place]) 11/10/2012  . BPH (benign prostatic hyperplasia) 10/22/2012  . Insomnia 10/22/2012  . Pneumonia 09/05/2012  . Hypothyroidism 06/19/2012  . Atypical nevi 06/19/2012  . Seborrheic keratosis 06/19/2012  . History of prostate cancer 11/07/2010  . Allergic rhinitis 11/07/2010  . COPD GOLD 3/ group D  02 dep 11/07/2010    Orientation RESPIRATION BLADDER Height & Weight     Self,Time,Situation,Place  O2 (see dc summary) Continent Weight: 125 lb (56.7 kg) Height:  5\' 5"  (165.1 cm)  BEHAVIORAL SYMPTOMS/MOOD NEUROLOGICAL BOWEL NUTRITION STATUS      Continent Diet (see dc summary)  AMBULATORY STATUS COMMUNICATION OF NEEDS Skin   Extensive Assist Verbally Surgical wounds                       Personal Care Assistance Level of Assistance   Bathing,Feeding,Dressing Bathing Assistance: Limited assistance Feeding assistance: Independent Dressing Assistance: Limited assistance     Functional Limitations Info  Sight,Hearing,Speech Sight Info: Adequate Hearing Info: Adequate Speech Info: Adequate    SPECIAL CARE FACTORS FREQUENCY  PT (By licensed PT),OT (By licensed OT)     PT Frequency: 5x week OT Frequency: 3x week            Contractures Contractures Info: Not present    Additional Factors Info  Code Status,Allergies Code Status Info: Full Allergies Info: Morphine           Current Medications (10/11/2020):  This is the current hospital active medication list Current Facility-Administered Medications  Medication Dose Route Frequency Provider Last Rate Last Admin  . albuterol (VENTOLIN HFA) 108 (90 Base) MCG/ACT inhaler 2 puff  2 puff Inhalation Q4H PRN Mordecai Rasmussen, MD   2 puff at 10/09/20 0806  . aspirin EC tablet 81 mg  81 mg Oral Daily Mordecai Rasmussen, MD   81 mg at 10/09/20 1558  . ceFAZolin (ANCEF) IVPB 2g/100 mL premix  2 g Intravenous Q8H Larena Glassman A, MD 200 mL/hr at 10/11/20 0615 2 g at 10/11/20 0615  . Chlorhexidine Gluconate Cloth 2 % PADS 6 each  6 each Topical Daily Barton Dubois, MD   6 each at 10/11/20 0940  . dextromethorphan-guaiFENesin (MUCINEX DM) 30-600 MG per 12 hr tablet 1 tablet  1 tablet Oral BID Mordecai Rasmussen, MD   1 tablet at 10/10/20  2210  . fentaNYL (SUBLIMAZE) injection 50 mcg  50 mcg Intravenous Q2H PRN Mordecai Rasmussen, MD   50 mcg at 10/11/20 0310  . fluticasone furoate-vilanterol (BREO ELLIPTA) 100-25 MCG/INH 1 puff  1 puff Inhalation Daily Mordecai Rasmussen, MD   1 puff at 10/11/20 0849  . heparin injection 5,000 Units  5,000 Units Subcutaneous Q8H Mordecai Rasmussen, MD   5,000 Units at 10/11/20 [phone]  . HYDROcodone-acetaminophen (NORCO/VICODIN) 5-325 MG per tablet 1-2 tablet  1-2 tablet Oral Q6H PRN Mordecai Rasmussen, MD   1 tablet at 10/10/20 1740  . levothyroxine (SYNTHROID)  tablet 75 mcg  75 mcg Oral QAC breakfast Mordecai Rasmussen, MD   75 mcg at 10/11/20 0932  . LORazepam (ATIVAN) tablet 0.5 mg  0.5 mg Oral BID PRN Mordecai Rasmussen, MD      . LORazepam (ATIVAN) tablet 0.5 mg  0.5 mg Oral NOW Kathie Dike, MD      . methocarbamol (ROBAXIN) tablet 500 mg  500 mg Oral Q6H PRN Mordecai Rasmussen, MD       Or  . methocarbamol (ROBAXIN) 500 mg in dextrose 5 % 50 mL IVPB  500 mg Intravenous Q6H PRN Mordecai Rasmussen, MD      . mirtazapine (REMERON) tablet 7.5 mg  7.5 mg Oral QHS Larena Glassman A, MD   7.5 mg at 10/10/20 2210  . mirtazapine (REMERON) tablet 7.5 mg  7.5 mg Oral Once PRN Reubin Milan, MD      . pantoprazole (PROTONIX) EC tablet 40 mg  40 mg Oral Daily Larena Glassman A, MD      . polyethylene glycol (MIRALAX / GLYCOLAX) packet 17 g  17 g Oral Daily PRN Mordecai Rasmussen, MD      . polyvinyl alcohol (LIQUIFILM TEARS) 1.4 % ophthalmic solution 1 drop  1 drop Both Eyes PRN Mordecai Rasmussen, MD      . predniSONE (DELTASONE) tablet 10 mg  10 mg Oral Q breakfast Memon, Jolaine Artist, MD      . tamsulosin (FLOMAX) capsule 0.4 mg  0.4 mg Oral Daily Larena Glassman A, MD      . umeclidinium bromide (INCRUSE ELLIPTA) 62.5 MCG/INH 1 puff  1 puff Inhalation Daily Mordecai Rasmussen, MD   1 puff at 10/11/20 3557     Discharge Medications: Please see discharge summary for a list of discharge medications.  Relevant Imaging Results:  Relevant Lab Results:   Additional Information SSN: [id number] [place]

## 2020-10-11 NOTE — Progress Notes (Signed)
Pt dropped 0.5 ativan Wife brought the pill to me, me and charge nurse looked up the pill it was the 0.5 ativan. We wasted the pill and pulled another one.

## 2020-10-11 NOTE — Evaluation (Signed)
Occupational Therapy Evaluation Patient Details Name: John Black [name]: [id number] DOB: April 02, 1929 Today's Date: 10/11/2020    History of Present Illness John Black is a 87 y.o. male with medical history significant of chronic respiratory failure with hypoxia secondary to COPD, history of BPH, history of prostate cancer, hypothyroidism, depression/anxiety and gastroesophageal reflux disease; who presented to the hospital following mechanical fall landing on her right side with inability to bear weight and with intractable pain.  Patient reports pain to be dull in nature 6 out of 10, worse with any range of motion in his right leg or if he tries to put weight on it.  No chest pain, no fever, no nausea, no vomiting, no abdominal pain, reports breathing at baseline 14, no dysuria, no hematuria, no melena, no hematochezia.  Patient denies sick contacts.   Clinical Impression   Pt agreeable to OT evaluation. Pt able to complete bed mobility with min guard assist. Noted limitations due to shortness of breath. Extended time needed throughout evaluation. Minimal assist for stand pivot transfer with RW and use of gait belt. Pt at a level of set up assist for seated grooming and feeding. Further OT recommended at 2x a week for 2 weeks to increase strength and mobility for increased functional ADL independence. Recommended discharge to SNF to ensure availability of assist as needed.        Follow Up Recommendations  SNF          Precautions / Restrictions Precautions Precautions: Posterior Hip;Fall Precaution Booklet Issued: No Precaution Comments: taped precautions on board in room. Restrictions Weight Bearing Restrictions: Yes RLE Weight Bearing: Weight bearing as tolerated Other Position/Activity Restrictions: posterior hip      Mobility Bed Mobility Overal bed mobility: Needs Assistance Bed Mobility: Supine to Sit     Supine to sit: Min guard     General bed mobility  comments: Min guard during supine to sit. Generally weak. Extended time needed due to shortness of breath.    Transfers Overall transfer level: Needs assistance Equipment used: Rolling walker (2 wheeled) Transfers: Stand Pivot Transfers   Stand pivot transfers: Min assist;Min guard                                                       ADL either performed or assessed with clinical judgement   ADL Overall ADL's : Needs assistance/impaired Eating/Feeding: Set up;Sitting Eating/Feeding Details (indicate cue type and reason): Assist to open ensure container. Grooming: Environmental health practitioner Details (indicate cue type and reason): Able to use wash cloth to wash face with set up assist while seated in recliner.                 Toilet Transfer: Min guard;Minimal Control and instrumentation engineer Details (indicate cue type and reason): Simulated with tranfer to chair from bed. Min assist needed for initial sit to stand from bed level.           General ADL Comments: Generally weak and needing minimal assistance for tranfers.     Vision Baseline Vision/History: No visual deficits                  Pertinent Vitals/Pain Pain Assessment: 0-10 Pain Score: 3  Pain Location: R hip Pain Intervention(s): Monitored during session;Repositioned     Hand Dominance Right  Extremity/Trunk Assessment Upper Extremity Assessment Upper Extremity Assessment: Generalized weakness   Lower Extremity Assessment Lower Extremity Assessment: Defer to PT evaluation       Communication Communication Communication: No difficulties   Cognition Arousal/Alertness: Awake/alert Behavior During Therapy: WFL for tasks assessed/performed Overall Cognitive Status: Within Functional Limits for tasks assessed                                                      Home Living Family/patient expects to be discharged to:: Private  residence Living Arrangements: Spouse/significant other;Children Available Help at Discharge: Family Type of Home: House Home Access: Level entry     Home Layout: Multi-level Alternate Level Stairs-Number of Steps: stair lift   Bathroom Shower/Tub: Walk-in shower;Tub/shower unit   Bathroom Toilet: Handicapped height Bathroom Accessibility: Yes   Home Equipment: Walker - 2 wheels;Walker - 4 wheels;Cane - single point;Shower seat - built in;Hand held shower head;Grab bars - tub/shower;Transport chair   Additional Comments: pt typically on 4L [place]      Prior Functioning/Environment Level of Independence: Needs assistance        Comments: Pt reported level of independence prior to fall.        OT Problem List: Decreased strength;Decreased activity tolerance;Impaired balance (sitting and/or standing);Decreased range of motion;Pain      OT Treatment/Interventions: Self-care/ADL training;Therapeutic exercise;Energy conservation;DME and/or AE instruction;Therapeutic activities;Patient/family education    OT Goals(Current goals can be found in the care plan section) Acute Rehab OT Goals Patient Stated Goal: Become more mobile. OT Goal Formulation: With patient Time For Goal Achievement: 10/25/20 Potential to Achieve Goals: Good  OT Frequency: Min 2X/week                                              End of Session Equipment Utilized During Treatment: Gait belt;Rolling walker;Oxygen  Activity Tolerance: Patient limited by fatigue Patient left: in chair;with call bell/phone within reach;with chair alarm set  OT Visit Diagnosis: Muscle weakness (generalized) (M62.81);Unsteadiness on feet (R26.81);Pain Pain - Right/Left: Right Pain - part of body: Hip                Time: 1610-9604 OT Time Calculation (min): 30 min Charges:  OT General Charges $OT Visit: 1 Visit OT Evaluation $OT Eval Low Complexity: 1 Low  John Black OT, MOT   Larey Seat 10/11/2020, 8:54 AM

## 2020-10-11 NOTE — Progress Notes (Signed)
PROGRESS NOTE    John Black  OJJ:[id number] DOB: 1929-09-16 DOA: 10/09/2020 PCP: Alycia Rossetti, MD   Chief Complaint  Patient presents with  . Hip Pain    Right hip pain following fall at home    Brief Narrative:  John Black is a 87 y.o. male with medical history significant of chronic respiratory failure with hypoxia secondary to COPD, history of BPH, history of prostate cancer, hypothyroidism, depression/anxiety and gastroesophageal reflux disease; who presented to the hospital following mechanical fall landing on her right side with inability to bear weight and with intractable pain.  Patient reports pain to be dull in nature 6 out of 10, worse with any range of motion in his right leg or if he tries to put weight on it.  No chest pain, no fever, no nausea, no vomiting, no abdominal pain, reports breathing at baseline 14, no dysuria, no hematuria, no melena, no hematochezia.  Patient denies sick contacts.  ED Course: Chest x-ray without acute cardiopulmonary process; right hip images demonstrating acute right femoral neck fracture; pending COVID test; mild leukocytosis with WBCs of 13.0 and a stable basic metabolic panel.  Orthopedic service has been consulted and recommendations given for admission to pursue surgical repair.  TRH has been called to facilitate patient's placement and further management.  Review of Systems: As per HPI otherwise all other systems reviewed and are negative.  Assessment & Plan: 1-Acute right hip fracture -Continue to follow recommendations by orthopedic service -s/p operative management on 1/24 -Continue as needed analgesics. -weight bearing as tolerated. -seen by PT/OT with recommendations for SNF placement -follow up with ortho in 2 weeks  2-chronic respiratory failure with hypoxia secondary to COPD -chronically on 4L of oxygen at home -He did receive 3 doses of solumedrol on admission and is now back on chronic prednisone 10mg   daily dosing -continue home Fluticasone-vilanterol inhaler.  -continue prn albuterol  3-hypothyroidism -Continue Synthroid.  4-Hx of esophageal reflux disease -Continue PPI.  5-BPH -continue Flomax.  6-depression/anxiety -Continue the use of Remeron and as needed Ativan -Stable mood.  7-right shoulder pain -No abnormalities appreciated on x-ray at time of admission -Continue as needed analgesia -Will follow recommendation by PT.  DVT prophylaxis: Heparin Code Status: Full code Family Communication: Wife at bedside Disposition:   Status is: Inpatient  Dispo: The patient is from: Home              Anticipated d/c is to: SNF              Anticipated d/c date is: 1-2 days              Patient currently is medically stable     Consultants:   Orthopedic service   Procedures:  See below for x-ray report.   Antimicrobials:  None   Subjective: Reports that pain is reasonably controlled. Feels that breathing is not at baseline. Feels that work of breathing is slightly more than baseline. He has anxiety.  Objective: Vitals:   10/11/20 0530 10/11/20 0735 10/11/20 0749 10/11/20 0849  BP: (!) 137/91 (!) 154/93 124/88   Pulse: 92 93 60   Resp: 18 17    Temp: 98.4 F (36.9 C)     TempSrc: Oral Oral    SpO2: 93% 95% 94% 92%  Weight:      Height:        Intake/Output Summary (Last 24 hours) at 10/11/2020 1122 Last data filed at 10/11/2020 0900 Gross per 24 hour  Intake 1981.39 ml  Output 550 ml  Net 1431.39 ml   Filed Weights   10/09/20 0629  Weight: 56.7 kg    Examination:  General exam: Alert, awake, oriented x 3 Respiratory system: Clear to auscultation. Respiratory effort normal. Cardiovascular system:RRR. No murmurs, rubs, gallops. Gastrointestinal system: Abdomen is nondistended, soft and nontender. No organomegaly or masses felt. Normal bowel sounds heard. Central nervous system: Alert and oriented. No focal neurological deficits. Extremities:  No C/C/E, +pedal pulses Skin: No rashes, lesions or ulcers Psychiatry: Judgement and insight appear normal. Mood & affect appropriate.     Data Reviewed: I have personally reviewed following labs and imaging studies  CBC: Recent Labs  Lab 10/09/20 0800 10/10/20 0708  WBC 13.0* 11.9*  NEUTROABS 10.8*  --   HGB 12.9* 13.9  HCT 40.7 43.0  MCV 94.7 93.3  PLT 286 956    Basic Metabolic Panel: Recent Labs  Lab 10/09/20 0800 10/10/20 0708  NA 135 133*  K 3.9 4.2  CL 97* 94*  CO2 30 28  GLUCOSE 115* 165*  BUN 25* 17  CREATININE 0.85 0.80  CALCIUM 9.1 9.0    GFR: Estimated Creatinine Clearance: 50.2 mL/min (by C-G formula based on SCr of 0.8 mg/dL).   Recent Results (from the past 240 hour(s))  SARS CORONAVIRUS 2 (TAT 6-24 HRS) Nasopharyngeal Nasopharyngeal Swab     Status: None   Collection Time: 10/09/20  8:05 AM   Specimen: Nasopharyngeal Swab  Result Value Ref Range Status   SARS Coronavirus 2 NEGATIVE NEGATIVE Final    Comment: (NOTE) SARS-CoV-2 target nucleic acids are NOT DETECTED.  The SARS-CoV-2 RNA is generally detectable in upper and lower respiratory specimens during the acute phase of infection. Negative results do not preclude SARS-CoV-2 infection, do not rule out co-infections with other pathogens, and should not be used as the sole basis for treatment or other patient management decisions. Negative results must be combined with clinical observations, patient history, and epidemiological information. The expected result is Negative.  Fact Sheet for Patients: SugarRoll.be  Fact Sheet for Healthcare Providers: https://www.woods-mathews.com/  This test is not yet approved or cleared by the [place] FDA and  has been authorized for detection and/or diagnosis of SARS-CoV-2 by FDA under an Emergency Use Authorization (EUA). This EUA will remain  in effect (meaning this test can be used) for the duration of  the COVID-19 declaration under Se ction 564(b)(1) of the Act, 21 U.S.C. section 360bbb-3(b)(1), unless the authorization is terminated or revoked sooner.  Performed at [facility], [place] [address], [place], [place] [address]      Radiology Studies: DG Shoulder Right  Result Date: 10/09/2020 CLINICAL DATA:  Status post fall with right arm pain. EXAM: RIGHT SHOULDER - 2+ VIEW COMPARISON:  Chest x-ray October 09, 2020 FINDINGS: There is no evidence of fracture or dislocation. Degenerative joint changes of the right shoulder identified. Chronic scar of the right apex is noted. IMPRESSION: No acute fracture or dislocation. Electronically Signed   By: Abelardo Diesel M.D.   On: 10/09/2020 15:44   Chest Portable 1 View  Result Date: 10/09/2020 CLINICAL DATA:  Preop for surgery. EXAM: PORTABLE CHEST 1 VIEW COMPARISON:  September 08, 2020 FINDINGS: The heart size and mediastinal contours are within normal limits. Scarring of the right upper lobe is unchanged. Emphysematous changes are stable. The visualized skeletal structures are unremarkable. IMPRESSION: No active cardiopulmonary disease identified. Electronically Signed   By: Abelardo Diesel M.D.   On:  10/09/2020 13:47   DG Hip Unilat W or Wo Pelvis 2-3 Views Right  Result Date: 10/09/2020 CLINICAL DATA:  Status post fall.  Right hip pain. EXAM: DG HIP (WITH OR WITHOUT PELVIS) 2-3V RIGHT COMPARISON:  CT abdomen pelvis Feb 04, 2020 FINDINGS: There is a foreshortened fracture through the right femoral neck. Mild right hip joint degenerative changes. Vascular calcifications. IMPRESSION: Right femoral neck fracture. Electronically Signed   By: Lovey Newcomer M.D.   On: 10/09/2020 07:33   Scheduled Meds: . aspirin EC  81 mg Oral Daily  . Chlorhexidine Gluconate Cloth  6 each Topical Daily  . dextromethorphan-guaiFENesin  1 tablet Oral BID  . fluticasone furoate-vilanterol  1 puff Inhalation Daily  . heparin injection (subcutaneous)  5,000 Units  Subcutaneous Q8H  . levothyroxine  75 mcg Oral QAC breakfast  . mirtazapine  7.5 mg Oral QHS  . pantoprazole  40 mg Oral Daily  . predniSONE  10 mg Oral Q breakfast  . tamsulosin  0.4 mg Oral Daily  . umeclidinium bromide  1 puff Inhalation Daily   Continuous Infusions: .  ceFAZolin (ANCEF) IV 2 g (10/11/20 0615)  . methocarbamol (ROBAXIN) IV       LOS: 2 days    Time spent: 30 minutes    Kathie Dike, MD [facility]   To contact the attending provider between 7A-7P or the covering provider during after hours 7P-7A, please log into the web site [email or web address] and access using universal [place] password for that web site. If you do not have the password, please call the hospital operator.  10/11/2020, 11:22 AM

## 2020-10-11 NOTE — Progress Notes (Signed)
   ORTHOPAEDIC PROGRESS NOTE  s/p Procedure(s): Right hip hemiarthroplasty  DOS: 10/11/2020  SUBJECTIVE: No issues over night.  Pain is well controlled.  Had some difficulty eating because of consistency of the food.  No CP, SOB, N/V.  OBJECTIVE: PE:  Vitals:   10/11/20 0735 10/11/20 0749  BP: (!) 154/93 124/88  Pulse: 93   Resp: 17   Temp:    SpO2: 95%    Surgical dressing in place.  No visible drainage Hip abduction pillow in place Sensation intact distally.  Active motion TA/EHL Toes are warm and well perfused  XR of the right hip demonstrates prosthesis in good position.  Hip is reduced.  No acute fractures.   ASSESSMENT: John Black is a 87 y.o. male doing well postoperatively.  PLAN: Weightbearing: WBAT RLE; posterior hip precautions Insicional and dressing care: Dressings left intact until follow-up and Reinforce dressings as needed Orthopedic device(s): Hip abduction pillow in place at all times while in bed VTE prophylaxis: Aspirin 81mg  BID for at least 6 weeks Pain control: PO pain medications as needed Follow - up plan: 2 weeks   Contact information:     Nyra Anspaugh A. Amedeo Kinsman, MD [facility] [address] [place],  [place]  [address] Phone: [phone] Fax: [phone]

## 2020-10-11 NOTE — Progress Notes (Signed)
Foley removed pt tolerated well, peri care completed.

## 2020-10-11 NOTE — Plan of Care (Signed)
  Problem: Acute Rehab PT Goals(only PT should resolve) Goal: Pt Will Go Supine/Side To Sit Outcome: Progressing Flowsheets (Taken 10/11/2020 1101) Pt will go Supine/Side to Sit:  with min guard assist  with supervision Goal: Patient Will Transfer Sit To/From Stand Outcome: Progressing Flowsheets (Taken 10/11/2020 1101) Patient will transfer sit to/from stand:  with min guard assist  with minimal assist Goal: Pt Will Transfer Bed To Chair/Chair To Bed Outcome: Progressing Flowsheets (Taken 10/11/2020 1101) Pt will Transfer Bed to Chair/Chair to Bed: with min assist Goal: Pt Will Ambulate Outcome: Progressing Flowsheets (Taken 10/11/2020 1101) Pt will Ambulate:  25 feet  with minimal assist  with moderate assist  with rolling walker   11:01 AM, 10/11/20 Lonell Grandchild, MPT Physical Therapist with [facility] [phone] office [phone] mobile phone

## 2020-10-11 NOTE — TOC Initial Note (Signed)
Transition of Care [facility]) - Initial/Assessment Note    Patient Details  Name: John Black [name]: [id number] Date of Birth: October 22, 1928  [facility] CM/SW Contact:    Shade Flood, LCSW Phone Number: 10/11/2020, 10:58 AM  Clinical Narrative:                  Pt admitted from home with hip fracture. PT recommending SNF. Met with pt and his wife in the room and with pt's daughter on speaker phone. Discussed PT recommendation and pt and family in agreement. Discussed CMS choice options and will refer as requested.   At home, pt has a walker, wheelchair, BSC, and O2 for DME. Family plans to take pt home after rehab.  TOC will follow.  Expected Discharge Plan: Skilled Nursing Facility Barriers to Discharge: Continued Medical Work up,Insurance Authorization   Patient Goals and CMS Choice Patient states their goals for this hospitalization and ongoing recovery are:: get better CMS Medicare.gov Compare Post Acute Care list provided to:: Patient Choice offered to / list presented to : Patient  Expected Discharge Plan and Services Expected Discharge Plan: [place] In-house Referral: Clinical Social Work   Post Acute Care Choice: [place] Living arrangements for the past 2 months: [place]                                      Prior Living Arrangements/Services Living arrangements for the past 2 months: Single Family Home Lives with:: Spouse Patient language and need for interpreter reviewed:: Yes Do you feel safe going back to the place where you live?: Yes      Need for Family Participation in Patient Care: Yes (Comment) Care giver support system in place?: Yes (comment) Current home services: DME Criminal Activity/Legal Involvement Pertinent to Current Situation/Hospitalization: No - Comment as needed  Activities of Daily Living Home Assistive Devices/Equipment: Walker (specify type),Wheelchair,Cane (specify  quad or straight) ADL Screening (condition at time of admission) Patient's cognitive ability adequate to safely complete daily activities?: Yes Is the patient deaf or have difficulty hearing?: Yes Does the patient have difficulty seeing, even when wearing glasses/contacts?: No Does the patient have difficulty concentrating, remembering, or making decisions?: No Patient able to express need for assistance with ADLs?: Yes Does the patient have difficulty dressing or bathing?: No Independently performs ADLs?: Yes (appropriate for developmental age) Does the patient have difficulty walking or climbing stairs?: Yes Weakness of Legs: None Weakness of Arms/Hands: None  Permission Sought/Granted Permission sought to share information with : Chartered certified accountant granted to share information with : Yes, Verbal Permission Granted     Permission granted to share info w AGENCY: S NFs        Emotional Assessment Appearance:: Appears stated age Attitude/Demeanor/Rapport: Engaged Affect (typically observed): Pleasant Orientation: : Oriented to Self,Oriented to Place,Oriented to  Time,Oriented to Situation Alcohol / Substance Use: Not Applicable Psych Involvement: No (comment)  Admission diagnosis:  Preop examination [Z01.818] Fall [W19.XXXA] Closed right hip fracture ([place]) [S72.001A] Closed displaced fracture of right femoral neck ([place]) [S72.001A] Patient Active Problem List   Diagnosis Date Noted  . Closed right hip fracture ([place]) 10/09/2020  . Dysphagia 07/24/2019  . Protein calorie malnutrition ([place]) 02/24/2019  . Gait instability 01/27/2019  . Right inguinal hernia   . Chronic diarrhea 12/06/2017  . Inguinal hernia 03/02/2014  . Constipation 03/02/2014  . Chronic  respiratory failure with hypoxia ([place]) 12/09/2013  . Hemorrhoids 09/28/2013  . Hyperlipidemia 06/28/2013  . Sinusitis, chronic 11/28/2012  . COPD with acute exacerbation ([place]) 11/10/2012  . BPH  (benign prostatic hyperplasia) 10/22/2012  . Insomnia 10/22/2012  . Pneumonia 09/05/2012  . Hypothyroidism 06/19/2012  . Atypical nevi 06/19/2012  . Seborrheic keratosis 06/19/2012  . History of prostate cancer 11/07/2010  . Allergic rhinitis 11/07/2010  . COPD GOLD 3/ group D  02 dep 11/07/2010   PCP:  Alycia Rossetti, MD Pharmacy:   [id number] [place] [id number]: [phone]: [phone]    Social Determinants of Health (SDOH) Interventions    Readmission Risk Interventions Readmission Risk Prevention Plan 10/11/2020  Medication Screening Complete  Transportation Screening Complete  Some recent data might be hidden

## 2020-10-12 ENCOUNTER — Inpatient Hospital Stay
Admission: RE | Admit: 2020-10-12 | Discharge: 2020-10-20 | Disposition: A | Discharge status: Skilled Nursing Facility | Payer: PPO | Source: Ambulatory Visit | Attending: Internal Medicine | Admitting: Internal Medicine

## 2020-10-12 DIAGNOSIS — N400 Enlarged prostate without lower urinary tract symptoms: Secondary | ICD-10-CM | POA: Diagnosis not present

## 2020-10-12 DIAGNOSIS — G319 Degenerative disease of nervous system, unspecified: Secondary | ICD-10-CM | POA: Diagnosis not present

## 2020-10-12 DIAGNOSIS — R2681 Unsteadiness on feet: Secondary | ICD-10-CM | POA: Diagnosis not present

## 2020-10-12 DIAGNOSIS — R262 Difficulty in walking, not elsewhere classified: Secondary | ICD-10-CM | POA: Diagnosis not present

## 2020-10-12 DIAGNOSIS — W19XXXA Unspecified fall, initial encounter: Secondary | ICD-10-CM | POA: Diagnosis not present

## 2020-10-12 DIAGNOSIS — C61 Malignant neoplasm of prostate: Secondary | ICD-10-CM | POA: Diagnosis not present

## 2020-10-12 DIAGNOSIS — D75839 Thrombocytosis, unspecified: Secondary | ICD-10-CM | POA: Diagnosis not present

## 2020-10-12 DIAGNOSIS — Z7982 Long term (current) use of aspirin: Secondary | ICD-10-CM | POA: Diagnosis not present

## 2020-10-12 DIAGNOSIS — M75101 Unspecified rotator cuff tear or rupture of right shoulder, not specified as traumatic: Secondary | ICD-10-CM | POA: Diagnosis not present

## 2020-10-12 DIAGNOSIS — M4312 Spondylolisthesis, cervical region: Secondary | ICD-10-CM | POA: Diagnosis not present

## 2020-10-12 DIAGNOSIS — D649 Anemia, unspecified: Secondary | ICD-10-CM | POA: Diagnosis not present

## 2020-10-12 DIAGNOSIS — Z96641 Presence of right artificial hip joint: Secondary | ICD-10-CM | POA: Diagnosis not present

## 2020-10-12 DIAGNOSIS — S72001A Fracture of unspecified part of neck of right femur, initial encounter for closed fracture: Secondary | ICD-10-CM | POA: Diagnosis not present

## 2020-10-12 DIAGNOSIS — Z79899 Other long term (current) drug therapy: Secondary | ICD-10-CM | POA: Diagnosis not present

## 2020-10-12 DIAGNOSIS — I6523 Occlusion and stenosis of bilateral carotid arteries: Secondary | ICD-10-CM | POA: Diagnosis not present

## 2020-10-12 DIAGNOSIS — I10 Essential (primary) hypertension: Secondary | ICD-10-CM | POA: Diagnosis not present

## 2020-10-12 DIAGNOSIS — S72001D Fracture of unspecified part of neck of right femur, subsequent encounter for closed fracture with routine healing: Secondary | ICD-10-CM | POA: Diagnosis not present

## 2020-10-12 DIAGNOSIS — S199XXA Unspecified injury of neck, initial encounter: Secondary | ICD-10-CM | POA: Diagnosis not present

## 2020-10-12 DIAGNOSIS — Z7952 Long term (current) use of systemic steroids: Secondary | ICD-10-CM | POA: Diagnosis not present

## 2020-10-12 DIAGNOSIS — D62 Acute posthemorrhagic anemia: Secondary | ICD-10-CM | POA: Diagnosis not present

## 2020-10-12 DIAGNOSIS — Z87891 Personal history of nicotine dependence: Secondary | ICD-10-CM | POA: Diagnosis not present

## 2020-10-12 DIAGNOSIS — S01112A Laceration without foreign body of left eyelid and periocular area, initial encounter: Secondary | ICD-10-CM | POA: Diagnosis not present

## 2020-10-12 DIAGNOSIS — J449 Chronic obstructive pulmonary disease, unspecified: Secondary | ICD-10-CM | POA: Diagnosis not present

## 2020-10-12 DIAGNOSIS — Z9181 History of falling: Secondary | ICD-10-CM | POA: Diagnosis not present

## 2020-10-12 DIAGNOSIS — R1310 Dysphagia, unspecified: Secondary | ICD-10-CM | POA: Diagnosis not present

## 2020-10-12 DIAGNOSIS — G4700 Insomnia, unspecified: Secondary | ICD-10-CM | POA: Diagnosis not present

## 2020-10-12 DIAGNOSIS — Z23 Encounter for immunization: Secondary | ICD-10-CM | POA: Diagnosis not present

## 2020-10-12 DIAGNOSIS — J441 Chronic obstructive pulmonary disease with (acute) exacerbation: Secondary | ICD-10-CM | POA: Diagnosis not present

## 2020-10-12 DIAGNOSIS — F329 Major depressive disorder, single episode, unspecified: Secondary | ICD-10-CM | POA: Diagnosis not present

## 2020-10-12 DIAGNOSIS — J9611 Chronic respiratory failure with hypoxia: Secondary | ICD-10-CM | POA: Diagnosis not present

## 2020-10-12 DIAGNOSIS — Z8546 Personal history of malignant neoplasm of prostate: Secondary | ICD-10-CM | POA: Diagnosis not present

## 2020-10-12 DIAGNOSIS — S0993XA Unspecified injury of face, initial encounter: Secondary | ICD-10-CM | POA: Diagnosis not present

## 2020-10-12 DIAGNOSIS — E441 Mild protein-calorie malnutrition: Secondary | ICD-10-CM | POA: Diagnosis not present

## 2020-10-12 DIAGNOSIS — M19011 Primary osteoarthritis, right shoulder: Secondary | ICD-10-CM | POA: Diagnosis not present

## 2020-10-12 DIAGNOSIS — E440 Moderate protein-calorie malnutrition: Secondary | ICD-10-CM | POA: Diagnosis not present

## 2020-10-12 DIAGNOSIS — S0181XA Laceration without foreign body of other part of head, initial encounter: Secondary | ICD-10-CM | POA: Diagnosis not present

## 2020-10-12 DIAGNOSIS — I6389 Other cerebral infarction: Secondary | ICD-10-CM | POA: Diagnosis not present

## 2020-10-12 DIAGNOSIS — Z20822 Contact with and (suspected) exposure to covid-19: Secondary | ICD-10-CM | POA: Diagnosis not present

## 2020-10-12 DIAGNOSIS — K219 Gastro-esophageal reflux disease without esophagitis: Secondary | ICD-10-CM | POA: Diagnosis not present

## 2020-10-12 DIAGNOSIS — I6782 Cerebral ischemia: Secondary | ICD-10-CM | POA: Diagnosis not present

## 2020-10-12 DIAGNOSIS — H04123 Dry eye syndrome of bilateral lacrimal glands: Secondary | ICD-10-CM | POA: Diagnosis not present

## 2020-10-12 DIAGNOSIS — R55 Syncope and collapse: Secondary | ICD-10-CM | POA: Diagnosis not present

## 2020-10-12 DIAGNOSIS — R911 Solitary pulmonary nodule: Secondary | ICD-10-CM | POA: Diagnosis not present

## 2020-10-12 DIAGNOSIS — M6281 Muscle weakness (generalized): Secondary | ICD-10-CM | POA: Diagnosis not present

## 2020-10-12 DIAGNOSIS — F411 Generalized anxiety disorder: Secondary | ICD-10-CM | POA: Diagnosis not present

## 2020-10-12 DIAGNOSIS — J439 Emphysema, unspecified: Secondary | ICD-10-CM | POA: Diagnosis not present

## 2020-10-12 DIAGNOSIS — I700 Atherosclerosis of aorta: Secondary | ICD-10-CM | POA: Diagnosis not present

## 2020-10-12 DIAGNOSIS — Z9981 Dependence on supplemental oxygen: Secondary | ICD-10-CM | POA: Diagnosis not present

## 2020-10-12 DIAGNOSIS — E559 Vitamin D deficiency, unspecified: Secondary | ICD-10-CM | POA: Diagnosis not present

## 2020-10-12 DIAGNOSIS — M75102 Unspecified rotator cuff tear or rupture of left shoulder, not specified as traumatic: Secondary | ICD-10-CM | POA: Diagnosis not present

## 2020-10-12 DIAGNOSIS — E039 Hypothyroidism, unspecified: Secondary | ICD-10-CM | POA: Diagnosis not present

## 2020-10-12 DIAGNOSIS — K4090 Unilateral inguinal hernia, without obstruction or gangrene, not specified as recurrent: Secondary | ICD-10-CM | POA: Diagnosis not present

## 2020-10-12 DIAGNOSIS — R739 Hyperglycemia, unspecified: Secondary | ICD-10-CM | POA: Diagnosis not present

## 2020-10-12 LAB — BASIC METABOLIC PANEL
Anion gap: 8 (ref 5–15)
BUN: 25 mg/dL — ABNORMAL HIGH (ref 8–23)
CO2: 31 mmol/L (ref 22–32)
Calcium: 8.5 mg/dL — ABNORMAL LOW (ref 8.9–10.3)
Chloride: 95 mmol/L — ABNORMAL LOW (ref 98–111)
Creatinine, Ser: 0.76 mg/dL (ref 0.61–1.24)
GFR, Estimated: >60 mL/min (ref >60)
Glucose, Bld: 119 mg/dL — ABNORMAL HIGH (ref 70–99)
Potassium: 4.1 mmol/L (ref 3.5–5.1)
Sodium: 134 mmol/L — ABNORMAL LOW (ref 135–145)

## 2020-10-12 LAB — CBC
HCT: 36.7 % — ABNORMAL LOW (ref 39.0–52.0)
Hemoglobin: 11.9 g/dL — ABNORMAL LOW (ref 13.0–17.0)
MCH: 30.1 pg (ref 26.0–34.0)
MCHC: 32.4 g/dL (ref 30.0–36.0)
MCV: 92.9 fL (ref 80.0–100.0)
Platelets: 297 10*3/uL (ref 150–400)
RBC: 3.95 MIL/uL — ABNORMAL LOW (ref 4.22–5.81)
RDW: 13.8 % (ref 11.5–15.5)
WBC: 11 10*3/uL — ABNORMAL HIGH (ref 4.0–10.5)
nRBC: 0 % (ref 0.0–0.2)

## 2020-10-12 MED ORDER — ASPIRIN 81 MG PO TBEC: 81.0000 mg | DELAYED_RELEASE_TABLET | Freq: Two times a day (BID) | ORAL | 11 refills | Status: DC

## 2020-10-12 MED ORDER — LORAZEPAM 0.5 MG PO TABS: 0.5000 mg | ORAL_TABLET | Freq: Two times a day (BID) | ORAL | 0 refills | Status: DC | PRN

## 2020-10-12 MED ORDER — HYDROCODONE-ACETAMINOPHEN 5-325 MG PO TABS: 1.0000 | ORAL_TABLET | Freq: Four times a day (QID) | ORAL | 0 refills | Status: DC | PRN

## 2020-10-12 MED ORDER — ZOLPIDEM TARTRATE 5 MG PO TABS: ORAL_TABLET | ORAL | 0 refills | Status: DC

## 2020-10-12 NOTE — Progress Notes (Signed)
Physical Therapy Treatment Patient Details Name: John Black John Black: [id number] DOB: 01-Apr-1929 Today's Date: 10/12/2020    History of Present Illness John Black is a 87 y.o. male with medical history significant of chronic respiratory failure with hypoxia secondary to COPD, history of BPH, history of prostate cancer, hypothyroidism, depression/anxiety and gastroesophageal reflux disease; who presented to the hospital following mechanical fall landing on her right side with inability to bear weight and with intractable pain.  Patient reports pain to be dull in nature 6 out of 10, worse with any range of motion in his right leg or if he tries to put weight on it.  No chest pain, no fever, no nausea, no vomiting, no abdominal pain, reports breathing at baseline 14, no dysuria, no hematuria, no melena, no hematochezia.  Patient denies sick contacts.    PT Comments    Pt sitting in chair upon therapist entrance with wife in room, both friendly and willing to participate with therapy today.  Pt limited by pain with Rt LE movements.  Pt educated on movement and exercises to assist with functional strengthening and pain control.  Pt required verbal cueing for proper hand placement prior standing for safety and assistance, min A with transfer to standing.  Pt limited by pain and fatigue due to weakness with weight bearing.  Able to tolerate standing for 1'30" prior request to sit down.  Pt also limited by SOB.  Used 4L O2 A via nasal cannula, vitals assessed through session with O2 range 92-96%.  [name] left in chair with call bell within reach and wife in room.  Pt requested pain medication at EOS, RN aware.     Follow Up Recommendations  SNF;Supervision for mobility/OOB;Supervision - Intermittent     Equipment Recommendations  None recommended by PT    Recommendations for Other Services       Precautions / Restrictions Precautions Precautions: Posterior Hip;Fall Precaution Booklet  Issued: No Precaution Comments: taped precautions on board in room. Restrictions Weight Bearing Restrictions: Yes RLE Weight Bearing: Weight bearing as tolerated Other Position/Activity Restrictions: posterior hip    Mobility  Bed Mobility                  Transfers Overall transfer level: Needs assistance Equipment used: Rolling walker (2 wheeled) Transfers: Sit to/from Stand Sit to Stand: Min assist         General transfer comment: Cueing for proper hand placement to assist wiht STS safely, slow labored movements.  Increased SOB upon standing, monitored vitals through session.  Ambulation/Gait                 Stairs             Wheelchair Mobility    Modified Rankin (Stroke Patients Only)       Balance                                            Cognition Arousal/Alertness: Awake/alert Behavior During Therapy: WFL for tasks assessed/performed;Impulsive;Anxious Overall Cognitive Status: Within Functional Limits for tasks assessed                                        Exercises General Exercises - Lower Extremity Gluteal Sets: Both;10 reps;Seated Long Arc Quad:  Both;10 reps;Seated Hip ABduction/ADduction: Both;10 reps;Seated Hip Flexion/Marching: AROM;Left;AAROM;Both;10 reps;Seated;5 reps;Standing Toe Raises: Both;10 reps;Seated Heel Raises: Both;10 reps;Seated Other Exercises Other Exercises: 3 STS.  Standing tolerance 1'30" prior requested to sit    General Comments        Pertinent Vitals/Pain Pain Assessment: 0-10 Pain Score: 3  Pain Location: R hip, increased pain with WB to 5/10 Pain Descriptors / Indicators: Sore;Grimacing Pain Intervention(s): Monitored during session;Limited activity within patient's tolerance;Repositioned;Ice applied;Patient requesting pain meds-RN notified    Home Living                      Prior Function            PT Goals (current goals can now be  found in the care plan section)      Frequency           PT Plan Current plan remains appropriate    Co-evaluation              AM-PAC PT "6 Clicks" Mobility   Outcome Measure  Help needed turning from your back to your side while in a flat bed without using bedrails?: A Lot Help needed moving from lying on your back to sitting on the side of a flat bed without using bedrails?: A Lot Help needed moving to and from a bed to a chair (including a wheelchair)?: A Lot Help needed standing up from a chair using your arms (e.g., wheelchair or bedside chair)?: A Lot Help needed to walk in hospital room?: A Lot Help needed climbing 3-5 steps with a railing? : Total 6 Click Score: 11    End of Session Equipment Utilized During Treatment: Gait belt;Oxygen Activity Tolerance: Patient tolerated treatment well;Patient limited by fatigue Patient left: in chair;with call bell/phone within reach;with family/visitor present Nurse Communication: Mobility status PT Visit Diagnosis: Unsteadiness on feet (R26.81);Other abnormalities of gait and mobility (R26.89);Muscle weakness (generalized) (M62.81)     Time: 1245-8099 PT Time Calculation (min) (ACUTE ONLY): 24 min  Charges:  $Therapeutic Activity: 23-37 mins                     Ihor Austin, LPTA/CLT; CBIS [phone]   Aldona Lento 10/12/2020, 11:21 AM

## 2020-10-12 NOTE — Progress Notes (Incomplete)
{  All PT Notes:[id number]} 

## 2020-10-12 NOTE — Progress Notes (Signed)
Occupational Therapy Treatment Patient Details Name: John Black [name]: [id number] DOB: Mar 25, 1929 Today's Date: 10/12/2020    History of present illness John Black is a 87 y.o. male with medical history significant of chronic respiratory failure with hypoxia secondary to COPD, history of BPH, history of prostate cancer, hypothyroidism, depression/anxiety and gastroesophageal reflux disease; who presented to the hospital following mechanical fall landing on her right side with inability to bear weight and with intractable pain.  Patient reports pain to be dull in nature 6 out of 10, worse with any range of motion in his right leg or if he tries to put weight on it.  No chest pain, no fever, no nausea, no vomiting, no abdominal pain, reports breathing at baseline 14, no dysuria, no hematuria, no melena, no hematochezia.  Patient denies sick contacts.   OT comments  Pt agreeable to OT treatment. Pt able to sit at edge of bed from supine position with minimal assist. Able to ambulate to sink using RW with min guard assist. Pt able to stand at sink to complete face washing. Noted shortness of breath throughout ADL tasks. Required cueing to breath through nose. Pt demonstrated improved transfer status to min guard due to no assist needed for sit to stand. Pt is progressing towards goals, discharge remains appropriate.   Follow Up Recommendations  SNF     None recommended by OT          Precautions / Restrictions Precautions Precautions: Posterior Hip;Fall Precaution Booklet Issued: No Restrictions Weight Bearing Restrictions: Yes RLE Weight Bearing: Weight bearing as tolerated       Mobility Bed Mobility Overal bed mobility: Needs Assistance Bed Mobility: Supine to Sit     Supine to sit: Min guard;Min assist     General bed mobility comments: Requires assistance to move RLE out of bed.  Transfers Overall transfer level: Needs assistance Equipment used: Rolling walker  (2 wheeled) Transfers: Stand Pivot Transfers   Stand pivot transfers: Min guard       General transfer comment: Fatigued and needing extended time for rest breaks.                                               ADL either performed or assessed with clinical judgement   ADL Overall ADL's : Needs assistance/impaired     Grooming: Wash/dry face;Standing Grooming Details (indicate cue type and reason): Pt able to stand at sink using RW for 3 to 5 minutes to wash his face. Extended time needed due to shortness of breath. Cued to breath through nose and out through mouth.                 Toilet Transfer: Min guard;Ambulation;RW Toilet Transfer Details (indicate cue type and reason): Simulated via ambulatory transfer going from bed to standing at sink and then to sitting in the chair. Pt required min guard assist this date.           General ADL Comments: Shortness of breath during all ADL tasks.                       Cognition Arousal/Alertness: Awake/alert Behavior During Therapy: WFL for tasks assessed/performed;Impulsive;Anxious Overall Cognitive Status: Within Functional Limits for tasks assessed  Pertinent Vitals/ Pain       Pain Assessment: Faces Faces Pain Scale: Hurts a little bit Pain Location: R hip Pain Intervention(s): Repositioned;Ice applied                                                          Frequency  Min 2X/week        Progress Toward Goals  OT Goals(current goals can now be found in the care plan section)  Progress towards OT goals: Progressing toward goals  Acute Rehab OT Goals Patient Stated Goal: return home with family to assist OT Goal Formulation: With patient Time For Goal Achievement: 10/25/20 Potential to Achieve Goals: Good ADL Goals Pt Will Perform Eating: with modified  independence;sitting Pt Will Perform Grooming: with modified independence;sitting;Independently Pt Will Perform Upper Body Dressing: with modified independence;sitting Pt Will Perform Lower Body Dressing: with set-up;bed level;with adaptive equipment;sitting/lateral leans;with supervision Pt Will Transfer to Toilet: with supervision;stand pivot transfer Pt/caregiver will Perform Home Exercise Program: Increased strength;With written HEP provided;Both right and left upper extremity  Plan Discharge plan remains appropriate                                    End of Session Equipment Utilized During Treatment: Gait belt;Rolling walker;Oxygen  OT Visit Diagnosis: Muscle weakness (generalized) (M62.81);Unsteadiness on feet (R26.81);Pain Pain - Right/Left: Right Pain - part of body: Hip   Activity Tolerance Patient limited by fatigue   Patient Left in chair;with call bell/phone within reach;with chair alarm set;with nursing/sitter in room             Time: 0727-0750 OT Time Calculation (min): 23 min  Charges: OT General Charges $OT Visit: 1 Visit OT Treatments $Self Care/Home Management : 23-37 mins  Sequoyah Counterman OT, MOT    Larey Seat 10/12/2020, 8:18 AM

## 2020-10-12 NOTE — Progress Notes (Signed)
Report given to [place] at the [facility]

## 2020-10-12 NOTE — Progress Notes (Signed)
Patient became confused and removed gown and surgical dressing. Gown and linen changed. Foam placed over surgical site.

## 2020-10-12 NOTE — TOC Transition Note (Signed)
Transition of Care [facility]) - CM/SW Discharge Note   Patient Details  Name: John Black [name]: [id number] Date of Birth: 1929-08-30  Transition of Care [facility]) CM/SW Contact:  Shade Flood, LCSW Phone Number: 10/12/2020, 11:57 AM   Clinical Narrative:     Pt stable for dc today per MD. Pt's wife and daughter updated. This LCSW spoke with pt's daughter, Peggye Fothergill, to answer her questions. Then requested Tami at [facility] also call Jodie at her request.   DC clinical sent electronically. RN to call report. There are no other TOC needs for dc.  Final next level of care: Skilled Nursing Facility Barriers to Discharge: Barriers Resolved   Patient Goals and CMS Choice Patient states their goals for this hospitalization and ongoing recovery are:: get better CMS Medicare.gov Compare Post Acute Care list provided to:: Patient Choice offered to / list presented to : Patient  Discharge Placement   Existing PASRR number confirmed : 10/11/20          Patient chooses bed at: [facility] Patient to be transferred to facility by: [facility] Name of family member notified: Jodie Patient and family notified of of transfer: 10/12/20  Discharge Plan and Services In-house Referral: Clinical Social Work   Post Acute Care Choice: [place]                               Social Determinants of Health (SDOH) Interventions     Readmission Risk Interventions Readmission Risk Prevention Plan 10/11/2020  Medication Screening Complete  Transportation Screening Complete  Some recent data might be hidden

## 2020-10-12 NOTE — Progress Notes (Signed)
   ORTHOPAEDIC PROGRESS NOTE  s/p Procedure(s): Right hip hemiarthroplasty  DOS: 10/11/2020  SUBJECTIVE: Some confusion this morning, removed his dressing.  Pain is controlled.  Working well with PT.  He does not have any questions.   OBJECTIVE: PE:  Vitals:   10/12/20 0551 10/12/20 0754  BP: 104/72   Pulse: 62   Resp: 20   Temp: 98.8 F (37.1 C)   SpO2: 93% 92%   Sitting in chair New dressing in place.  No visible drainage Sensation intact distally.  Active motion TA/EHL Toes are warm and well perfused  XR of the right hip demonstrates prosthesis in good position.  Hip is reduced.  No acute fractures.   ASSESSMENT: John Black is a 87 y.o. male doing well postoperatively.  PLAN: Weightbearing: WBAT RLE; posterior hip precautions Insicional and dressing care: Reinforce dressings as needed Orthopedic device(s): Hip abduction pillow in place at all times while in bed VTE prophylaxis: Aspirin 81mg  BID for at least 6 weeks Pain control: PO pain medications as needed Follow - up plan: 2 weeks   Contact information:     Dorisann Schwanke A. Amedeo Kinsman, MD [facility] [address] [place],  [place]  [address] Phone: [phone] Fax: [phone]

## 2020-10-12 NOTE — Discharge Summary (Signed)
Physician Discharge Summary  John Black ZOX:[id number] DOB: 05/18/1929 DOA: 10/09/2020  PCP: John Rossetti, MD  Admit date: 10/09/2020 Discharge date: 10/12/2020  Admitted From: Home Disposition:   SNF  Recommendations for Outpatient Follow-up:  1. Follow up with PCP in 1-2 weeks 2. Please obtain BMP/CBC in one week    Discharge Condition: Stable CODE STATUS: FULL Diet recommendation: Heart Healthy   Brief/Interim Summary: John Flinchum Griffithsis a 87 y.o.malewith medical history significant ofchronic respiratory failure with hypoxia secondary to COPD, history of BPH, history of prostate cancer, hypothyroidism, depression/anxiety and gastroesophageal reflux disease; who presented to the hospital following mechanical fall landing on her right side with inability to bear weight and with intractable pain. Patient reports pain to be dull in nature 6 out of 10, worse with any range of motion in his right leg or if he tries to put weight on it. No chest pain, no fever, no nausea, no vomiting, no abdominal pain, reports breathing at baseline 14, no dysuria, no hematuria, no melena, no hematochezia. Patient denies sick contacts.  ED Course:Chest x-ray without acute cardiopulmonary process; right hip images demonstrating acute right femoral neck fracture; pending COVID test;mild leukocytosis with WBCs of 13.0 and a stable basic metabolic panel. Orthopedic service has been consulted and recommendations given for admission to pursue surgical repair. [facility] has been called to facilitate patient's placement and further management.  Review of Systems: As per HPI otherwise all other systems reviewed and are negative.  Discharge Diagnoses:  1-Acute right femoral neck fracture -Continue to follow recommendations by orthopedic service -s/p operative management on 1/24 (right hemiarthroplasty--John Black) -ASA 81 mg bid x 6 weeks per ortho -Continue as needed analgesics. -weight  bearing as tolerated with posterior hip precautions -seen by PT/OT with recommendations for SNF placement -follow up with ortho in 2 weeks -continue hydrocodone prn pain  2-chronic respiratory failure with hypoxia secondary to COPD -chronically on 4L of oxygen at home 24/7 -stable -seen by Dr. Melvyn Black 10/11/20--agrees with present management--okay to go to SNF with Trelegy -He did receive 3 doses of solumedrol on admission and is now back on chronic prednisone 10mg  daily dosing -continue prn albuterol  3-hypothyroidism -Continue Synthroid.  4-Hx of esophageal reflux disease -Continue PPI.  5-BPH -continue Flomax.  6-depression/anxiety -Continue the use of Remeron and as needed Ativan -Stable mood.  7-right shoulder pain -No abnormalities appreciated on x-ray at time of admission -Continue as needed analgesia -follow recommendation by PT.  Discharge Instructions   Allergies as of 10/12/2020      Reactions   Morphine    REACTION: sweats      Medication List    STOP taking these medications   traMADol 50 MG tablet Commonly known as: ULTRAM     TAKE these medications   acetaminophen 325 MG tablet Commonly known as: TYLENOL Take 650 mg by mouth every 6 (six) hours as needed for mild pain.   aspirin 81 MG EC tablet Take 1 tablet (81 mg total) by mouth 2 (two) times daily. X 6 weeks, then once daily thereafter What changed:   when to take this  additional instructions   calcium gluconate 500 MG tablet Take 500 tablets by mouth daily.   Flutter Devi Use as directed.   GOLD BOND ORIGINAL STRENGTH EX Apply 1 application topically as needed (irritation).   guaiFENesin 600 MG 12 hr tablet Commonly known as: MUCINEX Take 600 mg by mouth 2 (two) times daily as needed for cough.   HYDROcodone-acetaminophen  5-325 MG tablet Commonly known as: NORCO/VICODIN Take 1-2 tablets by mouth every 6 (six) hours as needed for moderate pain.   levothyroxine 75 MCG  tablet Commonly known as: SYNTHROID TAKE 1 TABLET (75 MCG TOTAL) BY MOUTH DAILY. What changed: See the new instructions.   LORazepam 0.5 MG tablet Commonly known as: ATIVAN Take 1 tablet (0.5 mg total) by mouth 2 (two) times daily as needed. What changed: reasons to take this   menthol-zinc oxide powder Apply 1 application topically See admin instructions. After showers  And as needed for irritation   mirtazapine 15 MG tablet Commonly known as: REMERON TAKE 1/2 TABLET BY MOUTH IN THE EVENING What changed:   how much to take  how to take this  when to take this  additional instructions   pantoprazole 40 MG tablet Commonly known as: PROTONIX TAKE 1 TABLET BY MOUTH EVERY DAY   polyvinyl alcohol 1.4 % ophthalmic solution Commonly known as: LIQUIFILM TEARS Place 1 drop into both eyes as needed for dry eyes.   predniSONE 10 MG tablet Commonly known as: DELTASONE Take 1 tablet (10 mg total) by mouth daily with breakfast. HOLD while on Prednisone taper   tamsulosin 0.4 MG Caps capsule Commonly known as: FLOMAX TAKE 1 CAPSULE BY MOUTH EVERY DAY   Trelegy Ellipta 100-62.5-25 MCG/INH Aepb Generic drug: Fluticasone-Umeclidin-Vilant Inhale 1 puff into the lungs daily.   zolpidem 5 MG tablet Commonly known as: AMBIEN TAKE 1 TABLET BY MOUTH EVERY DAY AT BEDTIME AS NEEDED FOR SLEEP What changed: See the new instructions.       Contact information for after-discharge care    [facility] .   Service: Skilled Nursing Contact information: [address] [place] [address] [phone]                 Allergies  Allergen Reactions  . Morphine     REACTION: sweats    Consultations:  ortho   Procedures/Studies: DG Shoulder Right  Result Date: 10/09/2020 CLINICAL DATA:  Status post fall with right arm pain. EXAM: RIGHT SHOULDER - 2+ VIEW COMPARISON:  Chest x-ray October 09, 2020 FINDINGS: There is  no evidence of fracture or dislocation. Degenerative joint changes of the right shoulder identified. Chronic scar of the right apex is noted. IMPRESSION: No acute fracture or dislocation. Electronically Signed   By: Abelardo Diesel M.D.   On: 10/09/2020 15:44   Chest Portable 1 View  Result Date: 10/09/2020 CLINICAL DATA:  Preop for surgery. EXAM: PORTABLE CHEST 1 VIEW COMPARISON:  September 08, 2020 FINDINGS: The heart size and mediastinal contours are within normal limits. Scarring of the right upper lobe is unchanged. Emphysematous changes are stable. The visualized skeletal structures are unremarkable. IMPRESSION: No active cardiopulmonary disease identified. Electronically Signed   By: Abelardo Diesel M.D.   On: 10/09/2020 13:47   DG HIP OPERATIVE UNILAT WITH PELVIS RIGHT  Result Date: 10/10/2020 CLINICAL DATA:  Status post right hip replacement EXAM: OPERATIVE RIGHT HIP WITH PELVIS COMPARISON:  10/09/2020, CT from 02/04/2020 FINDINGS: Pelvic ring is intact. Prostate brachytherapy seeds are noted. Right hip prosthesis is now seen in satisfactory position. Changes consistent with the known right inguinal hernia are again seen. No car serration is noted. IMPRESSION: Status post right hip prosthesis. Changes of right inguinal hernia stable from prior CT. Electronically Signed   By: Inez Catalina M.D.   On: 10/10/2020 14:50   DG Hip Unilat W or  Wo Pelvis 2-3 Views Right  Result Date: 10/09/2020 CLINICAL DATA:  Status post fall.  Right hip pain. EXAM: DG HIP (WITH OR WITHOUT PELVIS) 2-3V RIGHT COMPARISON:  CT abdomen pelvis Feb 04, 2020 FINDINGS: There is a foreshortened fracture through the right femoral neck. Mild right hip joint degenerative changes. Vascular calcifications. IMPRESSION: Right femoral neck fracture. Electronically Signed   By: Lovey Newcomer M.D.   On: 10/09/2020 07:33         Discharge Exam: Vitals:   10/12/20 0754 10/12/20 1017  BP:  107/87  Pulse:  (!) 107  Resp:  18  Temp:   98.1 F (36.7 C)  SpO2: 92% 98%   Vitals:   10/11/20 2245 10/12/20 0551 10/12/20 0754 10/12/20 1017  BP:  104/72  107/87  Pulse: 96 62  (!) 107  Resp:  20  18  Temp:  98.8 F (37.1 C)  98.1 F (36.7 C)  TempSrc:    Oral  SpO2:  93% 92% 98%  Weight:      Height:        General: Pt is alert, awake, not in acute distress Cardiovascular: RRR, S1/S2 +, no rubs, no gallops Respiratory: diminished BS.  No wheeze.  Bibasilar rales Abdominal: Soft, NT, ND, bowel sounds + Extremities: no edema, no cyanosis   The results of significant diagnostics from this hospitalization (including imaging, microbiology, ancillary and laboratory) are listed below for reference.    Significant Diagnostic Studies: DG Shoulder Right  Result Date: 10/09/2020 CLINICAL DATA:  Status post fall with right arm pain. EXAM: RIGHT SHOULDER - 2+ VIEW COMPARISON:  Chest x-ray October 09, 2020 FINDINGS: There is no evidence of fracture or dislocation. Degenerative joint changes of the right shoulder identified. Chronic scar of the right apex is noted. IMPRESSION: No acute fracture or dislocation. Electronically Signed   By: Abelardo Diesel M.D.   On: 10/09/2020 15:44   Chest Portable 1 View  Result Date: 10/09/2020 CLINICAL DATA:  Preop for surgery. EXAM: PORTABLE CHEST 1 VIEW COMPARISON:  September 08, 2020 FINDINGS: The heart size and mediastinal contours are within normal limits. Scarring of the right upper lobe is unchanged. Emphysematous changes are stable. The visualized skeletal structures are unremarkable. IMPRESSION: No active cardiopulmonary disease identified. Electronically Signed   By: Abelardo Diesel M.D.   On: 10/09/2020 13:47   DG HIP OPERATIVE UNILAT WITH PELVIS RIGHT  Result Date: 10/10/2020 CLINICAL DATA:  Status post right hip replacement EXAM: OPERATIVE RIGHT HIP WITH PELVIS COMPARISON:  10/09/2020, CT from 02/04/2020 FINDINGS: Pelvic ring is intact. Prostate brachytherapy seeds are noted. Right hip  prosthesis is now seen in satisfactory position. Changes consistent with the known right inguinal hernia are again seen. No car serration is noted. IMPRESSION: Status post right hip prosthesis. Changes of right inguinal hernia stable from prior CT. Electronically Signed   By: Inez Catalina M.D.   On: 10/10/2020 14:50   DG Hip Unilat W or Wo Pelvis 2-3 Views Right  Result Date: 10/09/2020 CLINICAL DATA:  Status post fall.  Right hip pain. EXAM: DG HIP (WITH OR WITHOUT PELVIS) 2-3V RIGHT COMPARISON:  CT abdomen pelvis Feb 04, 2020 FINDINGS: There is a foreshortened fracture through the right femoral neck. Mild right hip joint degenerative changes. Vascular calcifications. IMPRESSION: Right femoral neck fracture. Electronically Signed   By: Lovey Newcomer M.D.   On: 10/09/2020 07:33     Microbiology: Recent Results (from the past 240 hour(s))  SARS CORONAVIRUS 2 ([place] 6-24 HRS)  Nasopharyngeal Nasopharyngeal Swab     Status: None   Collection Time: 10/09/20  8:05 AM   Specimen: Nasopharyngeal Swab  Result Value Ref Range Status   SARS Coronavirus 2 NEGATIVE NEGATIVE Final    Comment: (NOTE) SARS-CoV-2 target nucleic acids are NOT DETECTED.  The SARS-CoV-2 RNA is generally detectable in upper and lower respiratory specimens during the acute phase of infection. Negative results do not preclude SARS-CoV-2 infection, do not rule out co-infections with other pathogens, and should not be used as the sole basis for treatment or other patient management decisions. Negative results must be combined with clinical observations, patient history, and epidemiological information. The expected result is Negative.  Fact Sheet for Patients: SugarRoll.be  Fact Sheet for Healthcare Providers: https://www.woods-mathews.com/  This test is not yet approved or cleared by the [place] FDA and  has been authorized for detection and/or diagnosis of SARS-CoV-2 by FDA under  an Emergency Use Authorization (EUA). This EUA will remain  in effect (meaning this test can be used) for the duration of the COVID-19 declaration under Se ction 564(b)(1) of the Act, 21 U.S.C. section 360bbb-3(b)(1), unless the authorization is terminated or revoked sooner.  Performed at [facility], [place] [address], [place], [place] [address]      Labs: Basic Metabolic Panel: Recent Labs  Lab 10/09/20 0800 10/10/20 0708 10/12/20 0613  NA 135 133* 134*  K 3.9 4.2 4.1  CL 97* 94* 95*  CO2 30 28 31   GLUCOSE 115* 165* 119*  BUN 25* 17 25*  CREATININE 0.85 0.80 0.76  CALCIUM 9.1 9.0 8.5*   Liver Function Tests: No results for input(s): AST, ALT, ALKPHOS, BILITOT, PROT, ALBUMIN in the last 168 hours. No results for input(s): LIPASE, AMYLASE in the last 168 hours. No results for input(s): AMMONIA in the last 168 hours. CBC: Recent Labs  Lab 10/09/20 0800 10/10/20 0708 10/12/20 0613  WBC 13.0* 11.9* 11.0*  NEUTROABS 10.8*  --   --   HGB 12.9* 13.9 11.9*  HCT 40.7 43.0 36.7*  MCV 94.7 93.3 92.9  PLT 286 338 297   Cardiac Enzymes: No results for input(s): CKTOTAL, CKMB, CKMBINDEX, TROPONINI in the last 168 hours. BNP: Invalid input(s): POCBNP CBG: No results for input(s): GLUCAP in the last 168 hours.  Time coordinating discharge:  36 minutes  Signed:  Orson Eva, DO [facility] Pager: [phone] 10/12/2020, 11:09 AM

## 2020-10-13 ENCOUNTER — Other Ambulatory Visit: Payer: Self-pay | Admitting: Adult Health

## 2020-10-13 ENCOUNTER — Non-Acute Institutional Stay (SKILLED_NURSING_FACILITY): Payer: PPO | Admitting: Adult Health

## 2020-10-13 DIAGNOSIS — J9611 Chronic respiratory failure with hypoxia: Secondary | ICD-10-CM | POA: Diagnosis not present

## 2020-10-13 DIAGNOSIS — I700 Atherosclerosis of aorta: Secondary | ICD-10-CM

## 2020-10-13 DIAGNOSIS — S72001D Fracture of unspecified part of neck of right femur, subsequent encounter for closed fracture with routine healing: Secondary | ICD-10-CM

## 2020-10-13 DIAGNOSIS — E039 Hypothyroidism, unspecified: Secondary | ICD-10-CM

## 2020-10-13 DIAGNOSIS — K219 Gastro-esophageal reflux disease without esophagitis: Secondary | ICD-10-CM

## 2020-10-13 DIAGNOSIS — Z8546 Personal history of malignant neoplasm of prostate: Secondary | ICD-10-CM | POA: Diagnosis not present

## 2020-10-13 DIAGNOSIS — E440 Moderate protein-calorie malnutrition: Secondary | ICD-10-CM | POA: Diagnosis not present

## 2020-10-13 DIAGNOSIS — D62 Acute posthemorrhagic anemia: Secondary | ICD-10-CM | POA: Diagnosis not present

## 2020-10-13 DIAGNOSIS — J449 Chronic obstructive pulmonary disease, unspecified: Secondary | ICD-10-CM

## 2020-10-13 DIAGNOSIS — N400 Enlarged prostate without lower urinary tract symptoms: Secondary | ICD-10-CM

## 2020-10-13 DIAGNOSIS — E559 Vitamin D deficiency, unspecified: Secondary | ICD-10-CM

## 2020-10-13 MED ORDER — ZOLPIDEM TARTRATE 5 MG PO TABS: ORAL_TABLET | ORAL | 0 refills | Status: DC

## 2020-10-15 DIAGNOSIS — K219 Gastro-esophageal reflux disease without esophagitis: Secondary | ICD-10-CM | POA: Insufficient documentation

## 2020-10-15 DIAGNOSIS — E559 Vitamin D deficiency, unspecified: Secondary | ICD-10-CM | POA: Insufficient documentation

## 2020-10-15 DIAGNOSIS — I700 Atherosclerosis of aorta: Secondary | ICD-10-CM | POA: Insufficient documentation

## 2020-10-15 DIAGNOSIS — D62 Acute posthemorrhagic anemia: Secondary | ICD-10-CM | POA: Insufficient documentation

## 2020-10-15 NOTE — Progress Notes (Signed)
Location:  [place] Room Number: 155/P Place of Service:  SNF (31)   CODE STATUS: full  Allergies  Allergen Reactions  . Morphine     REACTION: sweats    Chief Complaint  Patient presents with  . Hospitalization Follow-up    Hospitalization Follow Up     HPI:  He is a 87 year old man who has been hospitalized from 10-09-20 through 10-12-20. He has a medical history which includes chronic respiratory failure copd. He had a mechanical fall landing on his right side with significant  pain and unable to bear weight. He was found to have a right femoral neck fracture. He had a right hemiarthroplasty on 10-10-20.  He does have right hip pain which is being managed. He denies any excessive shortness of breath; no cough. He denies any constipation or heart burn. He is here for short term rehab with his goal to return back home. He will continue to be followed for his chronic illnesses including: Closed right hip fracture with routine healing subsequent healing: . Aortic atherosclerosis  GOLD COPD 3/group d 02 dependent:     Past Medical History:  Diagnosis Date  . Allergic rhinitis   . Cataract    s/p removal  . Chronic respiratory failure (HCC)    oxygen 3L at home  . Emphysema   . Hypothyroid   . PNA (pneumonia)   . Prostate cancer [facility])     Past Surgical History:  Procedure Laterality Date  . ADENOIDECTOMY    . HIP ARTHROPLASTY Right 10/10/2020   Procedure: ARTHROPLASTY BIPOLAR HIP (HEMIARTHROPLASTY);  Surgeon: Mordecai Rasmussen, MD;  Location: [facility];  Service: Orthopedics;  Laterality: Right;  . ROTATOR CUFF REPAIR  1990,2009   bilateral  . Seed implant for prostate cancer  2000  . TONSILLECTOMY    . TRANSURETHRAL RESECTION OF PROSTATE  2001   x2    Social History   Socioeconomic History  . Marital status: Married    Spouse name: Not on file  . Number of children: Not on file  . Years of education: Not on file  . Highest education level:  Not on file  Occupational History  . Occupation: Chief Financial Officer  Tobacco Use  . Smoking status: Former Smoker    Packs/day: 1.50    Years: 50.00    Pack years: 75.00    Types: Cigarettes    Quit date: 09/17/2008    Years since quitting: 12.0  . Smokeless tobacco: Never Used  Vaping Use  . Vaping Use: Never used  Substance and Sexual Activity  . Alcohol use: Yes    Alcohol/week: 1.0 standard drink    Types: 1 Glasses of wine per week    Comment: each evening  . Drug use: No  . Sexual activity: Not on file  Other Topics Concern  . Not on file  Social History Narrative  . Not on file   Social Determinants of Health   Financial Resource Strain: Low Risk   . Difficulty of Paying Living Expenses: Not very hard  Food Insecurity: Not on file  Transportation Needs: Not on file  Physical Activity: Not on file  Stress: Not on file  Social Connections: Not on file  Intimate Partner Violence: Not on file   Family History  Problem Relation Age of Onset  . Heart disease Mother   . Prostate cancer Father       VITAL SIGNS BP 139/88   Pulse 88  Temp 97.8 F (36.6 C)   Ht 5\' 5"  (1.651 m)   Wt 124 lb 10 oz (56.5 kg)   BMI 20.74 kg/m   Outpatient Encounter Medications as of 10/13/2020  Medication Sig  . acetaminophen (TYLENOL) 325 MG tablet Take 650 mg by mouth every 6 (six) hours as needed for mild pain.  Marland Kitchen aspirin EC 81 MG tablet Take 81 mg by mouth. Twice a day from 10/12/2020-11/22/2020 Then take once a day starting 11/23/2020  . guaiFENesin (MUCINEX) 600 MG 12 hr tablet Take 600 mg by mouth 2 (two) times daily as needed for cough.  Marland Kitchen HYDROcodone-acetaminophen (NORCO/VICODIN) 5-325 MG tablet Take 1 tablet by mouth every 6 (six) hours as needed for moderate pain.  Marland Kitchen levothyroxine (SYNTHROID) 75 MCG tablet TAKE 1 TABLET (75 MCG TOTAL) BY MOUTH DAILY.  Marland Kitchen LORazepam (ATIVAN) 0.5 MG tablet Take 1 tablet (0.5 mg total) by mouth 2 (two) times daily as needed.  . tamsulosin (FLOMAX) 0.4 MG  CAPS capsule TAKE 1 CAPSULE BY MOUTH EVERY DAY  . calcium gluconate 500 MG tablet Take 500 tablets by mouth daily.  . Fluticasone-Umeclidin-Vilant (TRELEGY ELLIPTA) 100-62.5-25 MCG/INH AEPB Inhale 1 puff into the lungs daily.  . Menthol, Topical Analgesic, (GOLD BOND ORIGINAL STRENGTH EX) Apply 1 application topically as needed (irritation).  . menthol-zinc oxide (GOLD BOND) powder Apply 1 application topically See admin instructions. After showers  And as needed for irritation  . mirtazapine (REMERON) 15 MG tablet TAKE 1/2 TABLET BY MOUTH IN THE EVENING (Patient taking differently: Take 7.5 mg by mouth at bedtime.)  . pantoprazole (PROTONIX) 40 MG tablet TAKE 1 TABLET BY MOUTH EVERY DAY (Patient taking differently: Take 40 mg by mouth daily.)  . polyvinyl alcohol (LIQUIFILM TEARS) 1.4 % ophthalmic solution Place 1 drop into both eyes as needed for dry eyes.  . predniSONE (DELTASONE) 10 MG tablet Take 1 tablet (10 mg total) by mouth daily with breakfast. HOLD while on Prednisone taper  . Respiratory Therapy Supplies (FLUTTER) DEVI Use as directed.  . [DISCONTINUED] aspirin EC 81 MG EC tablet Take 1 tablet (81 mg total) by mouth 2 (two) times daily. X 6 weeks, then once daily thereafter (Patient taking differently: Take 81 mg by mouth.)  . [DISCONTINUED] HYDROcodone-acetaminophen (NORCO/VICODIN) 5-325 MG tablet Take 1-2 tablets by mouth every 6 (six) hours as needed for moderate pain.  . [DISCONTINUED] zolpidem (AMBIEN) 5 MG tablet TAKE 1 TABLET BY MOUTH EVERY DAY AT BEDTIME AS NEEDED FOR SLEEP   No facility-administered encounter medications on file as of 10/13/2020.     SIGNIFICANT DIAGNOSTIC EXAMS  TODAY  09-09-20: echo bubble  1. Left ventricular ejection fraction, by estimation, is 50 to 55%. The left ventricle has low normal function. The left ventricle has no regional wall motion abnormalities. There is moderate left ventricular hypertrophy. Left ventricular diastolic parameters are  consistent with Grade I diastolic dysfunction (impaired relaxation   10-09-20: pelvic x-ray: Right femoral neck fracture.   10-09-20: chest x-ray: No active cardiopulmonary disease identified   LABS REVIEWED TODAY  09-09-20: hgb a1c 5.7; chol 270; ldl 140; trig 47; hdl 121 10-09-20: wbc 13.0; hgb 12.9; hct 40.7; mcv 94.7 plt 286; glucose 115; bun 25; creat 0.85; k+ 3.9; na++ 135; ca 9.2 GFR>60 10-12-20; wbc 11.0; hgb 11.9; hct 36.7; mcv 992.9 plt 297 glucose 119; bun 25; creat 0.76; k+ 4.1; na++ 134; ca 8.5 GFR >60  Review of Systems  Constitutional: Negative for malaise/fatigue.  Respiratory: Negative for cough and shortness  of breath.   Cardiovascular: Negative for chest pain, palpitations and leg swelling.  Gastrointestinal: Negative for abdominal pain, constipation and heartburn.  Musculoskeletal: Positive for joint pain. Negative for back pain and myalgias.       Right hip pain   Skin: Negative.   Neurological: Negative for dizziness.  Psychiatric/Behavioral: The patient is not nervous/anxious.     Physical Exam Constitutional:      General: He is not in acute distress.    Appearance: He is well-developed and well-nourished. He is not diaphoretic.  Neck:     Thyroid: No thyromegaly.  Cardiovascular:     Rate and Rhythm: Normal rate and regular rhythm.     Pulses: Normal pulses and intact distal pulses.     Heart sounds: Normal heart sounds.  Pulmonary:     Effort: Pulmonary effort is normal. No respiratory distress.     Breath sounds: Wheezing present.     Comments: Few scattered wheezes; 02 Abdominal:     General: Bowel sounds are normal. There is no distension.     Palpations: Abdomen is soft.     Tenderness: There is no abdominal tenderness.  Musculoskeletal:        General: No edema.     Cervical back: Neck supple.     Right lower leg: No edema.     Left lower leg: No edema.     Comments: Is able to move all extremities Right hip arthroplasty 10-10-20 for right  femoral neck fracture.   Lymphadenopathy:     Cervical: No cervical adenopathy.  Skin:    General: Skin is warm and dry.     Comments: Right hip incision line without signs of infection present.   Neurological:     Mental Status: He is alert and oriented to person, place, and time.  Psychiatric:        Mood and Affect: Mood and affect and mood normal.       ASSESSMENT/ PLAN:  TODAY  1. Closed right hip fracture with routine healing subsequent healing: is stable will continue therapy as directed and will follow up with orthopedics will continue asa 81 mg twice daily through 11-22-20 then one time daily . Has vicodin 5/325 mg every 6 hours as needed  2. Aortic atherosclerosis (cxr 02-17-20): will monitor   3. GOLD COPD 3/group d 02 dependent: is without change in status: was seen by pulmonology in the hospital. Is 02 dependent will continue mucenix twice daily as needed; trelegy 100-62.5-25 mcg one puff daily prednisone 10 mg daily   4. Chronic respiratory failure with hypoxia: is stable is 02 dependent will monitor   5. GERD without esophagitis: is stable will continue protonix 40 mg daily   6. Hypothyroidism; unspecified type: is stable will continue synthroid 75 mcg daily   7. Moderate protein calorie malnutrition: is stable will continue remeron 7.5 mg nightly for appetite   8. Benign prostatic hyperplasia without lower urinary tract symptoms / history of prostate cancer is stable will continue flomax 0.4 mg daily   9. Acute blood loss anemia as cause of postoperative anemia: is stable hgb 11.9 is mild will monitor  10. Vitamin D deficiency: d is 28.37 will begin vit D 50,000 units weekly     MD is aware of resident's narcotic use and is in agreement with current plan of care. We will attempt to wean resident as appropriate.  Ok Edwards NP [facility]  Contact [phone] Monday through Friday 8am- 5pm  After  hours call [phone]

## 2020-10-19 ENCOUNTER — Encounter: Payer: Self-pay | Admitting: Internal Medicine

## 2020-10-19 ENCOUNTER — Other Ambulatory Visit: Payer: Self-pay | Admitting: Acute Care

## 2020-10-19 ENCOUNTER — Non-Acute Institutional Stay (SKILLED_NURSING_FACILITY): Payer: PPO | Admitting: Internal Medicine

## 2020-10-19 DIAGNOSIS — R739 Hyperglycemia, unspecified: Secondary | ICD-10-CM

## 2020-10-19 DIAGNOSIS — D62 Acute posthemorrhagic anemia: Secondary | ICD-10-CM | POA: Diagnosis not present

## 2020-10-19 DIAGNOSIS — J9611 Chronic respiratory failure with hypoxia: Secondary | ICD-10-CM

## 2020-10-19 DIAGNOSIS — S72001D Fracture of unspecified part of neck of right femur, subsequent encounter for closed fracture with routine healing: Secondary | ICD-10-CM | POA: Diagnosis not present

## 2020-10-19 NOTE — Assessment & Plan Note (Signed)
No intervention needed unless there is persistent hyperglycemia greater than 200.

## 2020-10-19 NOTE — Patient Instructions (Signed)
See assessment and plan under each diagnosis in the problem list and acutely for this visit 

## 2020-10-19 NOTE — Progress Notes (Signed)
NURSING HOME LOCATION:  [facility] ROOM NUMBER:  155  CODE STATUS:  Full Code  PCP:  Vic Blackbird MD  This is a comprehensive admission note to [place] performed on this date less than 30 days from date of admission. Included are preadmission medical/surgical history; reconciled medication list; family history; social history and comprehensive review of systems.  Corrections and additions to the records were documented. Comprehensive physical exam was also performed. Additionally a clinical summary was entered for each active diagnosis pertinent to this admission in the Problem List to enhance continuity of care.  HPI: Patient was hospitalized 1/23-1/26/2022 presenting after mechanical fall.  The patient landed on his right side and was unable to bear weight and was experiencing intractable pain.  Pain was aggravated by any range of motion of the right lower extremity or with any weightbearing.  Imaging revealed no acute right femoral neck fracture.  Dr. Amedeo Kinsman performed right hemiarthroplasty on 1/24.  Postop he was to be on 81 mg of aspirin twice daily for 6 weeks.  Weightbearing was to be as tolerated with posterior hip precautions.  PT/OT recommended SNF placement for rehab.  Hydrocodone was prescribed for pain.  Orthopedic follow-up was to be in 2 weeks post discharge. Incidental finding was a leukocytosis with a white count of 13,000. The patient has chronic respiratory failure with hypoxia secondary to COPD.  He is chronically on 4 L of nasal oxygen at home 24/7.  Pulmonary consulted and agreed with the perioperative management.  He did receive 3 doses of Solu-Medrol before transitioning to his maintenance prednisone 10 mg daily.  He was also using albuterol as needed.  Past medical and surgical history: Includes hypothyroidism, GERD, BPH, history of prostate cancer and anxiety/depression. Surgeries and procedures include rotator cuff repair x2.  He had seed  implant for prostate cancer.  He has also had a TURP.  Social history: Patient has one alcoholic beverage each night.  He has a 75-pack-year history of smoking.  He is a retired Chief Financial Officer and [place]  Family history: Reviewed, noncontributory due to advanced age.   Review of systems: He simply slipped on a hardwood floor; there was no neurologic or cardiac prodrome prior to the fall. Extremity pain significantly improved.  He states he did have a TIA recently.   He feels that his COPD is stable.  He does have some desaturation despite nasal oxygen when he ambulates.  Constitutional: No fever, significant weight change  Eyes: No redness, discharge, pain, vision change ENT/mouth: No nasal congestion, purulent discharge, earache, change in hearing, sore throat  Cardiovascular: No chest pain, palpitations, paroxysmal nocturnal dyspnea, claudication, edema  Respiratory: No cough, sputum production, hemoptysis, significant snoring, apnea  Gastrointestinal: No heartburn, dysphagia, abdominal pain, nausea /vomiting, rectal bleeding, melena, change in bowels Genitourinary: No dysuria, hematuria, pyuria, incontinence, nocturia Dermatologic: No rash, pruritus, change in appearance of skin Neurologic: No dizziness, headache, syncope, seizures, numbness, tingling Psychiatric: No significant anxiety, depression, insomnia, anorexia Endocrine: No change in hair/skin/nails, excessive thirst, excessive hunger, excessive urination  Hematologic/lymphatic: No significant bruising, lymphadenopathy, abnormal bleeding Allergy/immunology: No itchy/watery eyes, significant sneezing, urticaria, angioedema  Physical exam:  Pertinent or positive findings: He appears his stated age and appears frail with limb atrophy.  Pattern alopecia is present.  Arcus senilis is noted.  He has bilateral ptosis.  He is wearing nasal oxygen.  He is edentulous and not wearing his dentures.  He has bilateral hearing  aids.  Heart sounds are distant, heard faintly in the epigastrium.  He has dry rales anteriorly in the parasternal area.  He has low-grade rhonchi posteriorly.  A small umbilical hernia is present.  Pedal pulses are decreased.  He has 1+ edema. As noted the limbs are atrophic and he has interosseous wasting.  The upper extremities are weak to opposition. Slight R hand tremor @ rest.  General appearance:  no acute distress, increased work of breathing is present.   Lymphatic: No lymphadenopathy about the head, neck, axilla. Eyes: No conjunctival inflammation or lid edema is present. There is no scleral icterus. Ears:  External ear exam shows no significant lesions or deformities.   Nose:  External nasal examination shows no deformity or inflammation. Nasal mucosa are pink and moist without lesions, exudates Oral exam: Lips and gums are healthy appearing.There is no oropharyngeal erythema or exudate. Neck:  No thyromegaly, masses, tenderness noted.    Heart:  No gallop, murmur, click, rub.  Lungs:  without wheezes, rubs. Abdomen: Bowel sounds are normal.  Abdomen is soft and nontender with no organomegaly, hernias, masses. GU: Deferred  Extremities:  No cyanosis, clubbing. Neurologic exam: Balance, Rhomberg, finger to nose testing could not be completed due to clinical state Skin: Warm & dry w/o tenting. No significant lesions or rash.  See clinical summary under each active problem in the Problem List with associated updated therapeutic plan

## 2020-10-19 NOTE — Assessment & Plan Note (Signed)
O2 sats 93% on 4 L on present pulmonary toilet regimen which will not be changed.

## 2020-10-19 NOTE — Assessment & Plan Note (Addendum)
Post orthopedic surgery H/H dropped from 13.9/43 down to 11.9/36.7. No evidence of ongoing bleeding dyscrasia. Monitor CBC.

## 2020-10-20 ENCOUNTER — Other Ambulatory Visit: Payer: Self-pay | Admitting: Adult Health

## 2020-10-20 ENCOUNTER — Non-Acute Institutional Stay (SKILLED_NURSING_FACILITY): Payer: PPO | Admitting: Adult Health

## 2020-10-20 ENCOUNTER — Encounter: Payer: Self-pay | Admitting: Adult Health

## 2020-10-20 ENCOUNTER — Observation Stay (HOSPITAL_COMMUNITY)
Admission: EM | Admit: 2020-10-20 | Discharge: 2020-10-22 | Disposition: A | Discharge status: Home / Self Care | Payer: PPO | Attending: Internal Medicine | Admitting: Internal Medicine

## 2020-10-20 ENCOUNTER — Emergency Department (HOSPITAL_COMMUNITY): Payer: PPO

## 2020-10-20 DIAGNOSIS — K219 Gastro-esophageal reflux disease without esophagitis: Secondary | ICD-10-CM | POA: Diagnosis not present

## 2020-10-20 DIAGNOSIS — R55 Syncope and collapse: Secondary | ICD-10-CM | POA: Diagnosis not present

## 2020-10-20 DIAGNOSIS — Z23 Encounter for immunization: Secondary | ICD-10-CM | POA: Diagnosis not present

## 2020-10-20 DIAGNOSIS — S0993XA Unspecified injury of face, initial encounter: Secondary | ICD-10-CM | POA: Diagnosis not present

## 2020-10-20 DIAGNOSIS — J449 Chronic obstructive pulmonary disease, unspecified: Secondary | ICD-10-CM | POA: Diagnosis present

## 2020-10-20 DIAGNOSIS — E441 Mild protein-calorie malnutrition: Secondary | ICD-10-CM | POA: Diagnosis present

## 2020-10-20 DIAGNOSIS — J439 Emphysema, unspecified: Secondary | ICD-10-CM | POA: Diagnosis not present

## 2020-10-20 DIAGNOSIS — I6389 Other cerebral infarction: Secondary | ICD-10-CM | POA: Diagnosis not present

## 2020-10-20 DIAGNOSIS — I6782 Cerebral ischemia: Secondary | ICD-10-CM | POA: Diagnosis not present

## 2020-10-20 DIAGNOSIS — I700 Atherosclerosis of aorta: Secondary | ICD-10-CM | POA: Diagnosis not present

## 2020-10-20 DIAGNOSIS — C61 Malignant neoplasm of prostate: Secondary | ICD-10-CM | POA: Insufficient documentation

## 2020-10-20 DIAGNOSIS — Z87891 Personal history of nicotine dependence: Secondary | ICD-10-CM | POA: Diagnosis not present

## 2020-10-20 DIAGNOSIS — W19XXXA Unspecified fall, initial encounter: Secondary | ICD-10-CM | POA: Diagnosis not present

## 2020-10-20 DIAGNOSIS — M4312 Spondylolisthesis, cervical region: Secondary | ICD-10-CM | POA: Diagnosis not present

## 2020-10-20 DIAGNOSIS — S72001D Fracture of unspecified part of neck of right femur, subsequent encounter for closed fracture with routine healing: Secondary | ICD-10-CM | POA: Diagnosis not present

## 2020-10-20 DIAGNOSIS — S0181XA Laceration without foreign body of other part of head, initial encounter: Secondary | ICD-10-CM | POA: Diagnosis not present

## 2020-10-20 DIAGNOSIS — E039 Hypothyroidism, unspecified: Secondary | ICD-10-CM | POA: Diagnosis present

## 2020-10-20 DIAGNOSIS — Z20822 Contact with and (suspected) exposure to covid-19: Secondary | ICD-10-CM | POA: Diagnosis not present

## 2020-10-20 DIAGNOSIS — Z9981 Dependence on supplemental oxygen: Secondary | ICD-10-CM | POA: Insufficient documentation

## 2020-10-20 DIAGNOSIS — J9611 Chronic respiratory failure with hypoxia: Secondary | ICD-10-CM | POA: Diagnosis present

## 2020-10-20 DIAGNOSIS — Z7982 Long term (current) use of aspirin: Secondary | ICD-10-CM | POA: Diagnosis not present

## 2020-10-20 DIAGNOSIS — Z79899 Other long term (current) drug therapy: Secondary | ICD-10-CM | POA: Insufficient documentation

## 2020-10-20 DIAGNOSIS — D649 Anemia, unspecified: Secondary | ICD-10-CM | POA: Insufficient documentation

## 2020-10-20 DIAGNOSIS — G319 Degenerative disease of nervous system, unspecified: Secondary | ICD-10-CM | POA: Diagnosis not present

## 2020-10-20 DIAGNOSIS — S199XXA Unspecified injury of neck, initial encounter: Secondary | ICD-10-CM | POA: Diagnosis not present

## 2020-10-20 DIAGNOSIS — S01112A Laceration without foreign body of left eyelid and periocular area, initial encounter: Secondary | ICD-10-CM | POA: Diagnosis not present

## 2020-10-20 DIAGNOSIS — R911 Solitary pulmonary nodule: Secondary | ICD-10-CM | POA: Diagnosis not present

## 2020-10-20 LAB — CBC WITH DIFFERENTIAL/PLATELET
Abs Immature Granulocytes: 1.04 10*3/uL — ABNORMAL HIGH (ref 0.00–0.07)
Basophils Absolute: 0 10*3/uL (ref 0.0–0.1)
Basophils Relative: 0 %
Eosinophils Absolute: 0 10*3/uL (ref 0.0–0.5)
Eosinophils Relative: 0 %
HCT: 35.6 % — ABNORMAL LOW (ref 39.0–52.0)
Hemoglobin: 11 g/dL — ABNORMAL LOW (ref 13.0–17.0)
Immature Granulocytes: 9 %
Lymphocytes Relative: 5 %
Lymphs Abs: 0.6 10*3/uL — ABNORMAL LOW (ref 0.7–4.0)
MCH: 29.9 pg (ref 26.0–34.0)
MCHC: 30.9 g/dL (ref 30.0–36.0)
MCV: 96.7 fL (ref 80.0–100.0)
Monocytes Absolute: 0.6 10*3/uL (ref 0.1–1.0)
Monocytes Relative: 6 %
Neutro Abs: 8.9 10*3/uL — ABNORMAL HIGH (ref 1.7–7.7)
Neutrophils Relative %: 80 %
Platelets: 444 10*3/uL — ABNORMAL HIGH (ref 150–400)
RBC: 3.68 MIL/uL — ABNORMAL LOW (ref 4.22–5.81)
RDW: 14.4 % (ref 11.5–15.5)
WBC: 11.1 10*3/uL — ABNORMAL HIGH (ref 4.0–10.5)
nRBC: 0 % (ref 0.0–0.2)

## 2020-10-20 LAB — CBG MONITORING, ED: Glucose-Capillary: 108 mg/dL — ABNORMAL HIGH (ref 70–99)

## 2020-10-20 LAB — URINALYSIS, ROUTINE W REFLEX MICROSCOPIC
Bilirubin Urine: NEGATIVE
Glucose, UA: NEGATIVE mg/dL
Hgb urine dipstick: NEGATIVE
Ketones, ur: NEGATIVE mg/dL
Leukocytes,Ua: NEGATIVE
Nitrite: NEGATIVE
Protein, ur: NEGATIVE mg/dL
Specific Gravity, Urine: 1.015 (ref 1.005–1.030)
pH: 7 (ref 5.0–8.0)

## 2020-10-20 LAB — COMPREHENSIVE METABOLIC PANEL
ALT: 51 U/L — ABNORMAL HIGH (ref 0–44)
AST: 36 U/L (ref 15–41)
Albumin: 3.2 g/dL — ABNORMAL LOW (ref 3.5–5.0)
Alkaline Phosphatase: 69 U/L (ref 38–126)
Anion gap: 7 (ref 5–15)
BUN: 22 mg/dL (ref 8–23)
CO2: 29 mmol/L (ref 22–32)
Calcium: 8.9 mg/dL (ref 8.9–10.3)
Chloride: 97 mmol/L — ABNORMAL LOW (ref 98–111)
Creatinine, Ser: 1.06 mg/dL (ref 0.61–1.24)
GFR, Estimated: >60 mL/min (ref >60)
Glucose, Bld: 137 mg/dL — ABNORMAL HIGH (ref 70–99)
Potassium: 5.3 mmol/L — ABNORMAL HIGH (ref 3.5–5.1)
Sodium: 133 mmol/L — ABNORMAL LOW (ref 135–145)
Total Bilirubin: 0.5 mg/dL (ref 0.3–1.2)
Total Protein: 6.3 g/dL — ABNORMAL LOW (ref 6.5–8.1)

## 2020-10-20 LAB — POC OCCULT BLOOD, ED: Fecal Occult Bld: POSITIVE — AB

## 2020-10-20 IMAGING — CT CT CERVICAL SPINE W/O CM
3 of 4 series · 11 of 33 positions shown, 13 images · non-contrast
Comparison: Brain MRI/MRA [DATE].  Head CT [DATE].

CLINICAL DATA: Facial trauma. Additional provided: Fall, swelling
above left eye, no loss of consciousness, alert and oriented.

EXAM:
CT HEAD WITHOUT CONTRAST
CT CERVICAL SPINE WITHOUT CONTRAST
TECHNIQUE: Multidetector CT imaging of the head and cervical spine was
performed following the standard protocol without intravenous
contrast. Multiplanar CT image reconstructions of the cervical spine
were also generated.

[Series 5: sagittal bone · sagittal · 0.23mm/px · 5 of 61 slices shown, 6 images]
[im 21/61  bone]
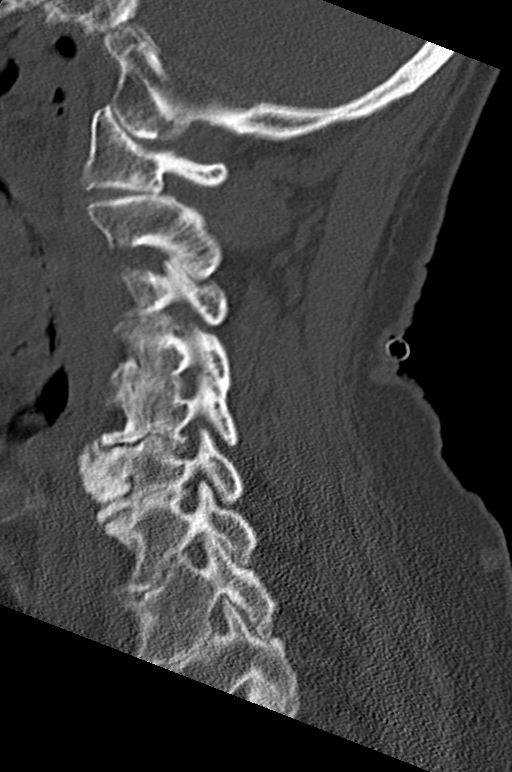
[im 26/61  bone]
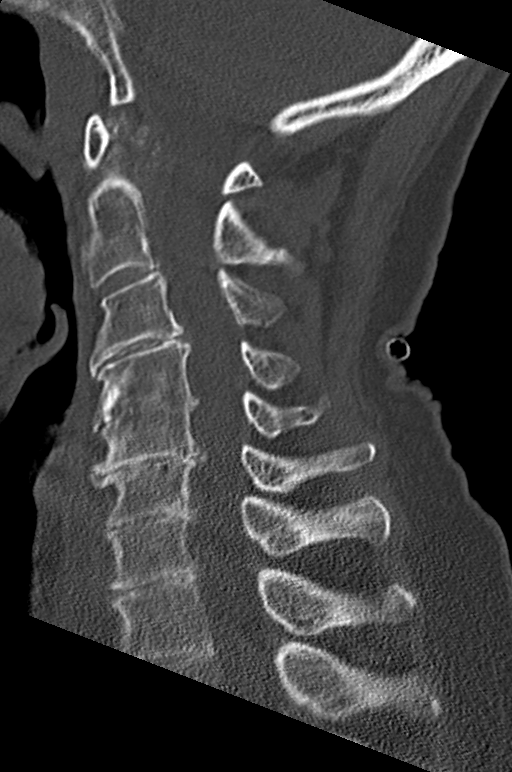
[im 31/61  soft-tissue]
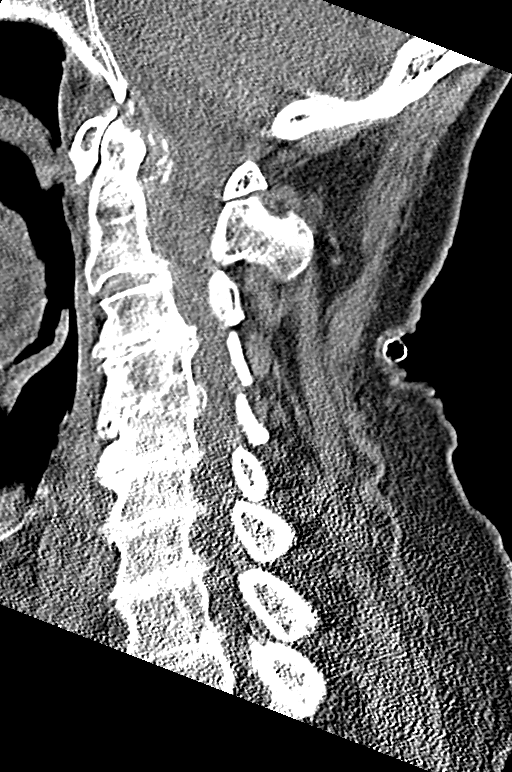
[im 31/61  bone]
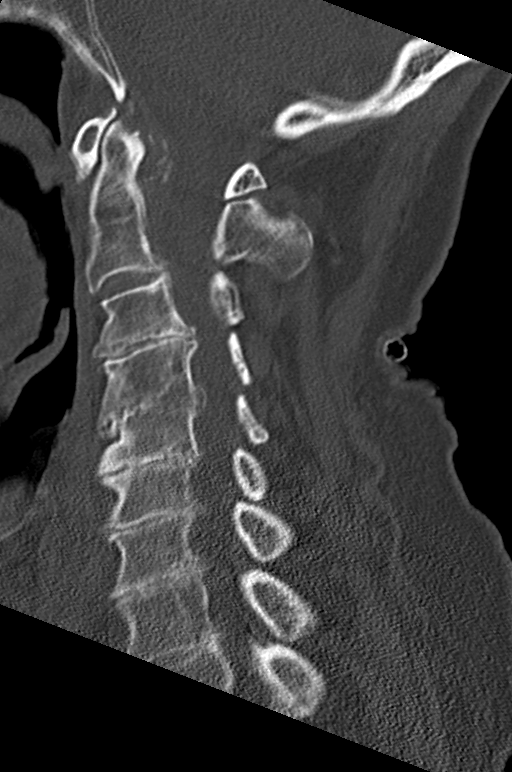
[im 36/61  bone]
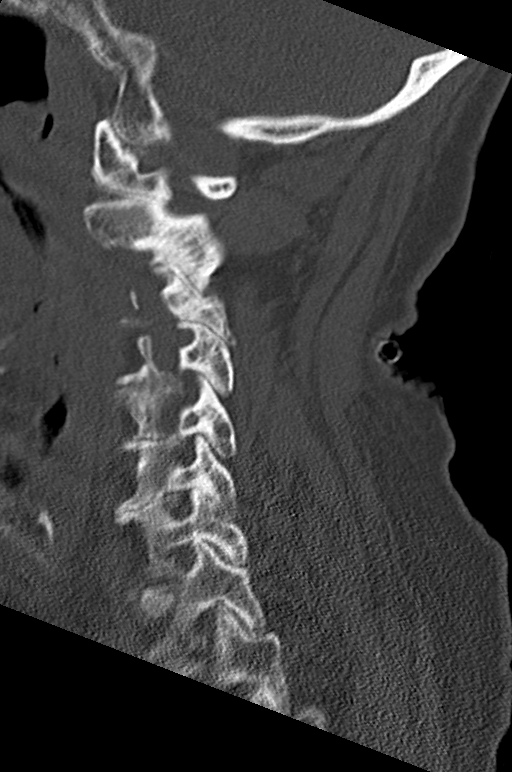
[im 41/61  bone]
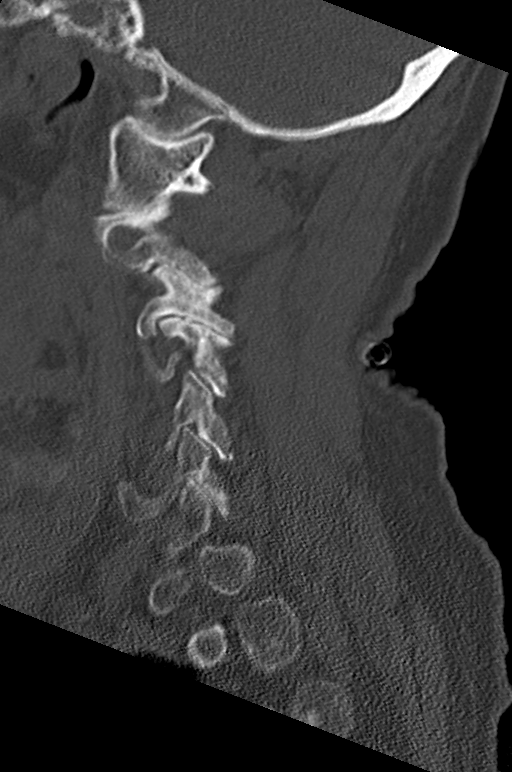

[Series 6: coronal bone · coronal · 0.23mm/px · 3 of 61 slices shown]
[im 13/61  bone]
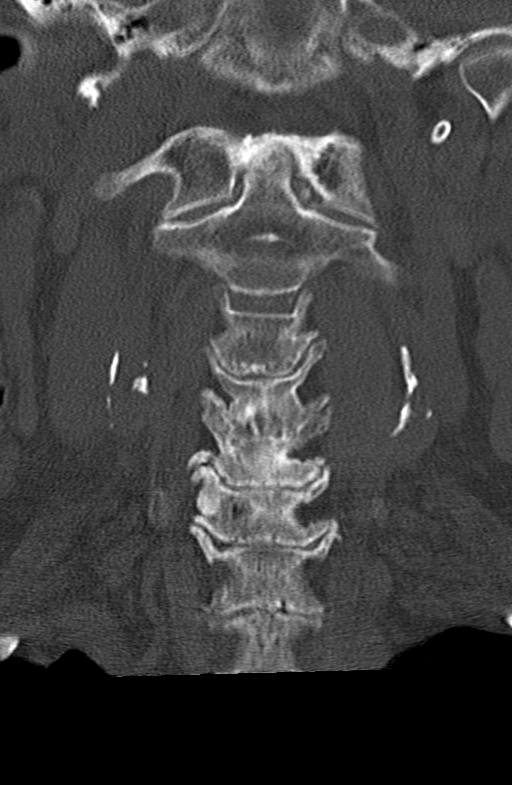
[im 25/61  bone]
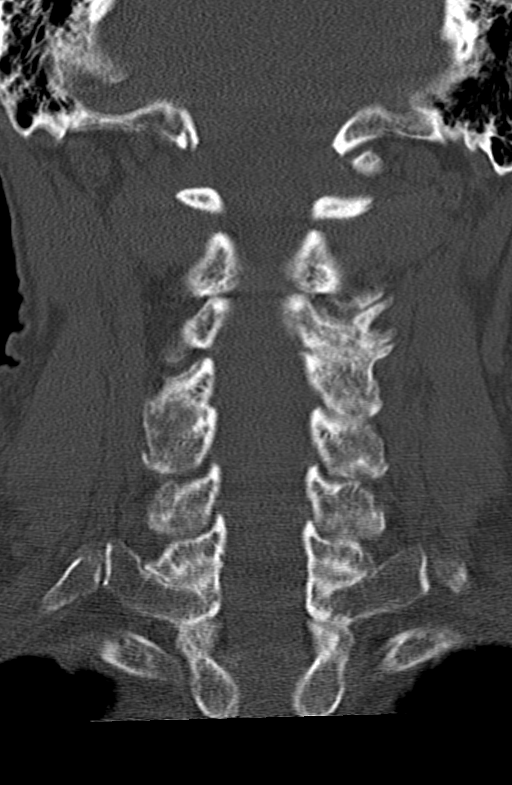
[im 37/61  bone]
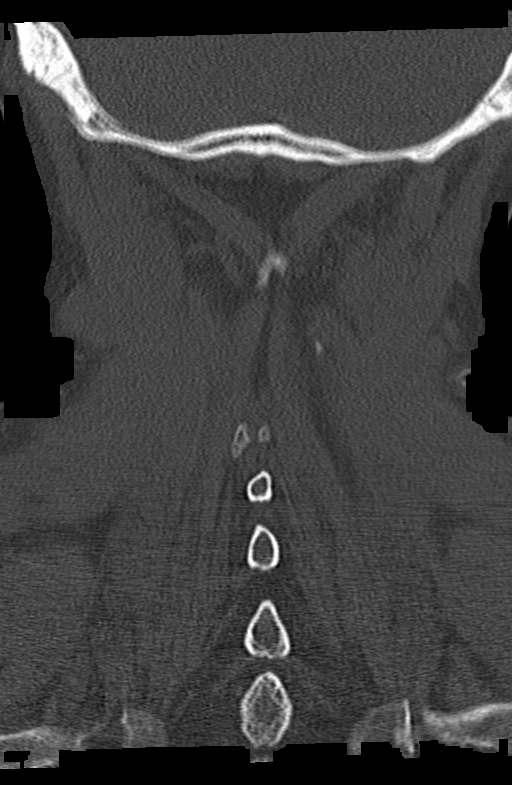

[Series 7: orthogonal axials · axial · 0.21mm/px · z∈[-88,+7]mm · 3 of 76 slices shown, 4 images]
[im 13/76  soft-tissue]
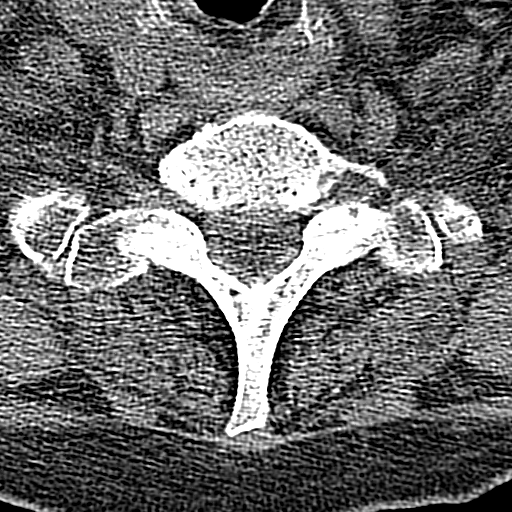
[im 13/76  bone]
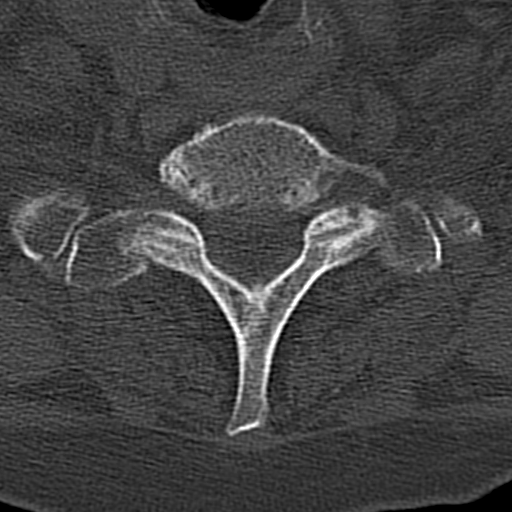
[im 38/76  bone]
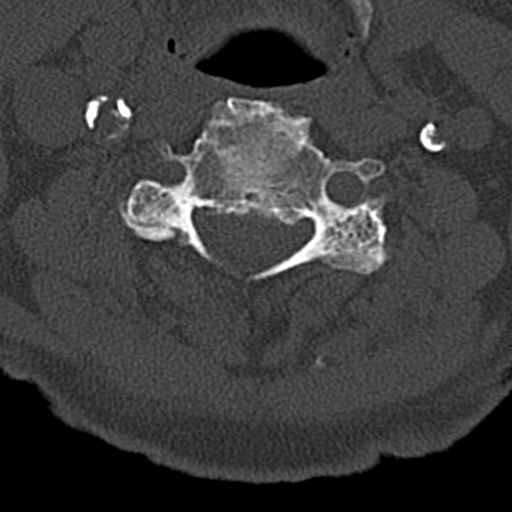
[im 63/76  bone]
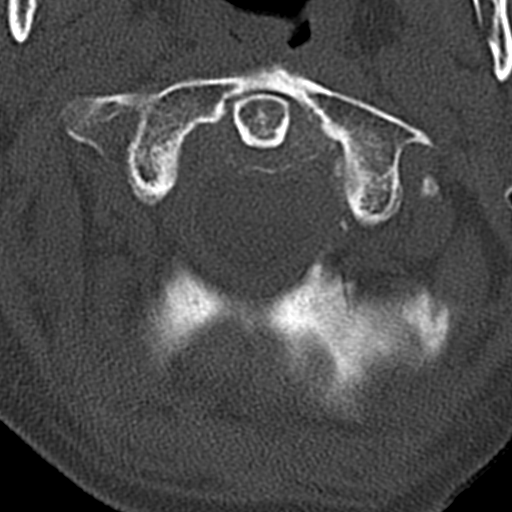

[11 of 33 positions shown; findings below may reference images not displayed]

FINDINGS: CT HEAD FINDINGS

Brain:

Mildly motion degraded exam.

Mild-to-moderate cerebral atrophy.

Redemonstrated small chronic cortical infarcts within the bilateral
frontal lobes, left parietal lobe and right occipital lobe.

Background advanced ill-defined hypoattenuation within the cerebral
white matter is nonspecific, but compatible with chronic small
vessel ischemic disease.

Known small chronic infarcts within the bilateral cerebellar
hemispheres better appreciated on the prior MRI of [DATE].

There is no acute intracranial hemorrhage.

No acute demarcated cortical infarct.

No extra-axial fluid collection.

No evidence of intracranial mass.

No midline shift.

Vascular: No hyperdense vessel.  Atherosclerotic calcifications.

Skull: Normal. Negative for fracture or focal lesion.

Sinuses/Orbits: Visualized orbits show no acute finding. Mild
frontal, ethmoid and maxillary sinus mucosal thickening at the
imaged levels. Small right maxillary sinus mucous retention cyst.

CT CERVICAL SPINE FINDINGS

Mildly motion degraded exam.

Alignment: Nonspecific reversal of the expected cervical lordosis.
Trace C2-C3 grade 1 anterolisthesis. Trace C5-C6 grade 1
retrolisthesis.

Skull base and vertebrae: The basion-dental and atlanto-dental
intervals are maintained.No evidence of acute fracture to the
cervical spine.

Soft tissues and spinal canal: No prevertebral fluid or swelling. No
visible canal hematoma.

Disc levels: Cervical spondylosis with multilevel disc space
narrowing, disc bulges, posterior disc osteophytes, uncovertebral
hypertrophy and facet arthrosis. At C4-C5, there is vertebral fusion
and right facet joint ankylosis. Disc space narrowing is severe at
C3-C4, C5-C6, C6-C7 and C7-T1. Small caudally migrated central disc
extrusion at C2-C3 contributing to apparent moderate spinal canal
stenosis. Multilevel up to moderate spinal canal stenosis at the
remaining levels. Multilevel bony neural foraminal narrowing. Bony
neural foraminal narrowing. Additionally, there is prominent
ligamentous hypertrophy/pannus formation posterior to the dens
contributing to mild C1-C2 canal narrowing.

Upper chest: Biapical bullous emphysema.
IMPRESSION: CT head:

1. Mildly motion degraded exam.
2. No evidence of acute intracranial abnormality.
3. Redemonstrated small chronic cortical infarcts within the
bilateral frontal lobes, left parietal and right occipital lobes.
4. Stable background mild-to-moderate cerebral atrophy and severe
chronic small vessel ischemic disease.
5. Known small chronic infarcts within the bilateral cerebellar
hemispheres.
6. Mild paranasal disease, as described.

CT cervical spine:

1. Mildly motion degraded exam.
2. No evidence of acute fracture to the cervical spine.
3. Nonspecific reversal of the expected cervical lordosis.
4. Minimal C2-C3 grade 1 anterolisthesis and C5-C6 grade 1
retrolisthesis.
5. Cervical spondylosis as described with up to moderate appreciable
spinal canal stenosis and multilevel bony neural foraminal
narrowing. Degenerative fusion at C4-C5.
6. Biapical bullous emphysema (emphysema ([RE]-[RE])).

## 2020-10-20 IMAGING — CT CT HEAD W/O CM
3 of 4 series · 13 of 47 positions shown, 15 images · non-contrast
Comparison: Brain MRI/MRA [DATE].  Head CT [DATE].

CLINICAL DATA: Facial trauma. Additional provided: Fall, swelling
above left eye, no loss of consciousness, alert and oriented.

EXAM:
CT HEAD WITHOUT CONTRAST
CT CERVICAL SPINE WITHOUT CONTRAST
TECHNIQUE: Multidetector CT imaging of the head and cervical spine was
performed following the standard protocol without intravenous
contrast. Multiplanar CT image reconstructions of the cervical spine
were also generated.

[Series 2: head w o · axial · 0.45mm/px · z∈[+53,+173]mm · 7 of 33 slices shown, 9 images]
[im 5/33  brain]
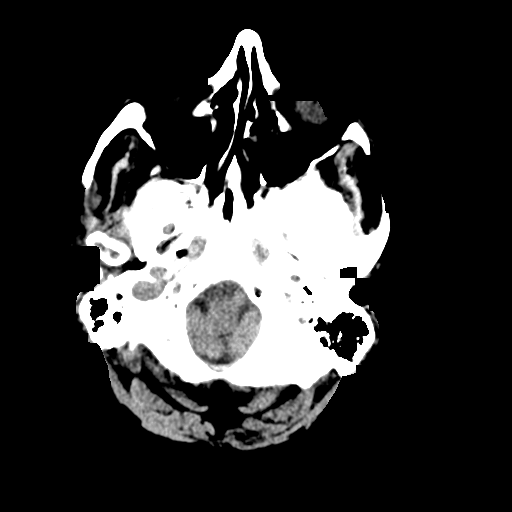
[im 5/33  bone]
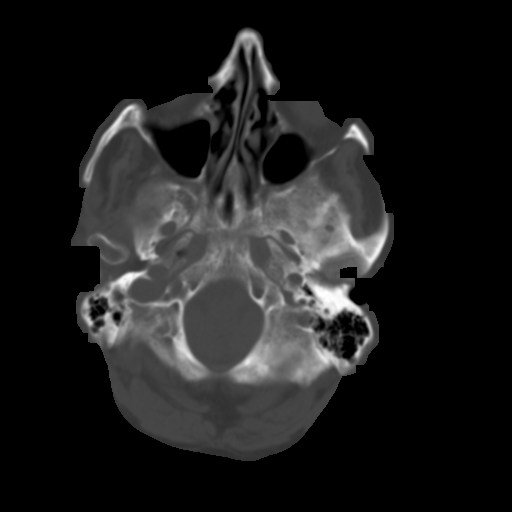
[im 9/33  brain]
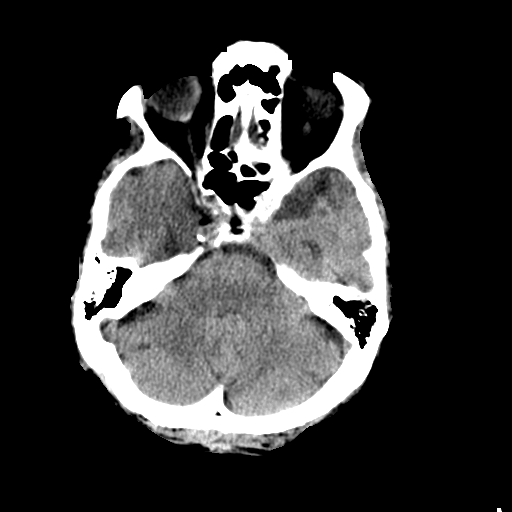
[im 13/33  brain]
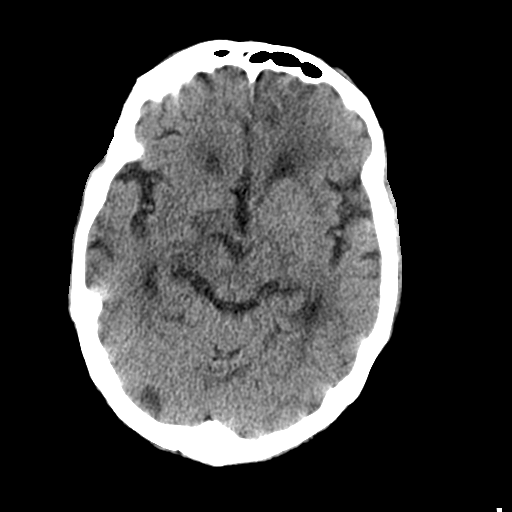
[im 17/33  brain]
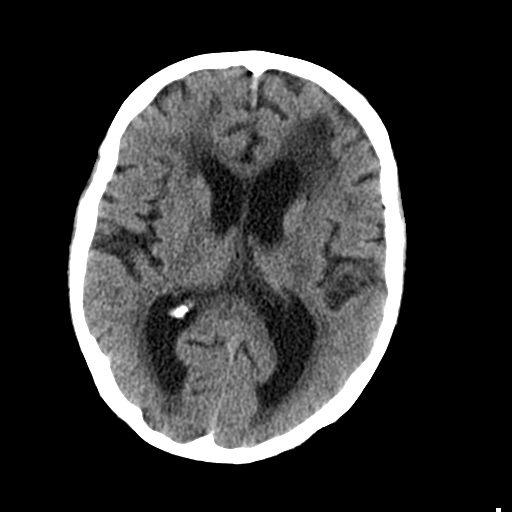
[im 21/33  brain]
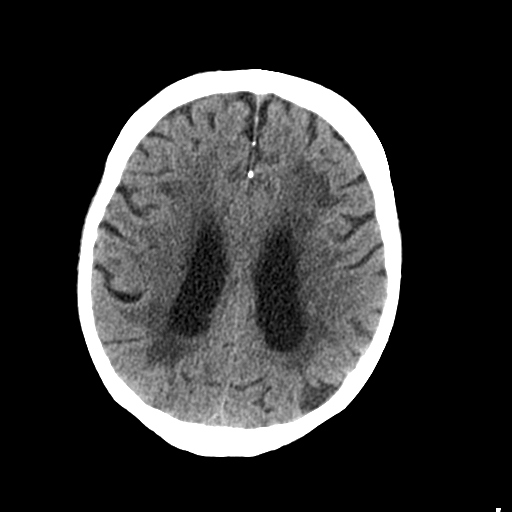
[im 21/33  bone]
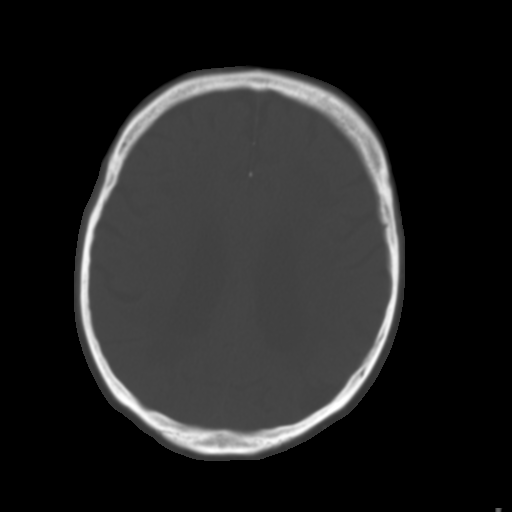
[im 25/33  brain]
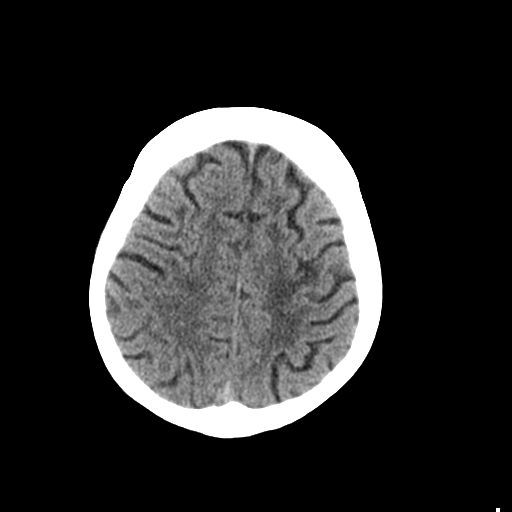
[im 29/33  brain]
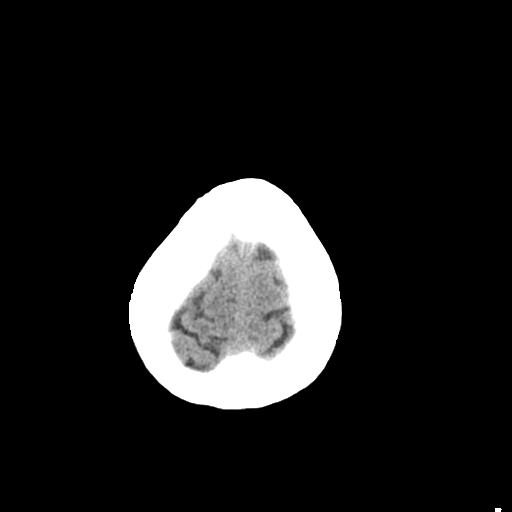

[Series 4: coronal soft · coronal · 0.33mm/px · 3 of 67 slices shown]
[im 23/67  brain]
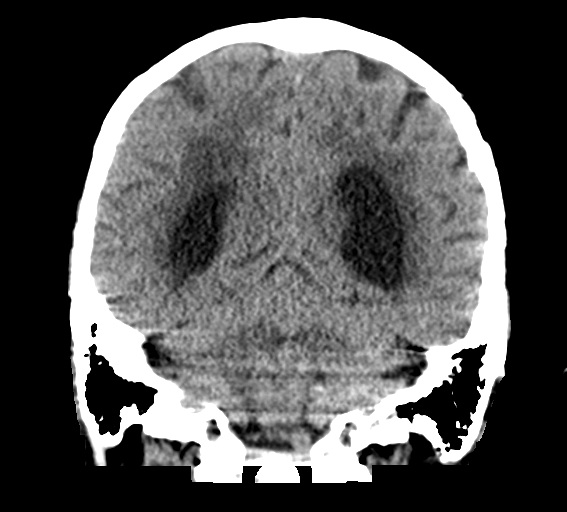
[im 30/67  brain]
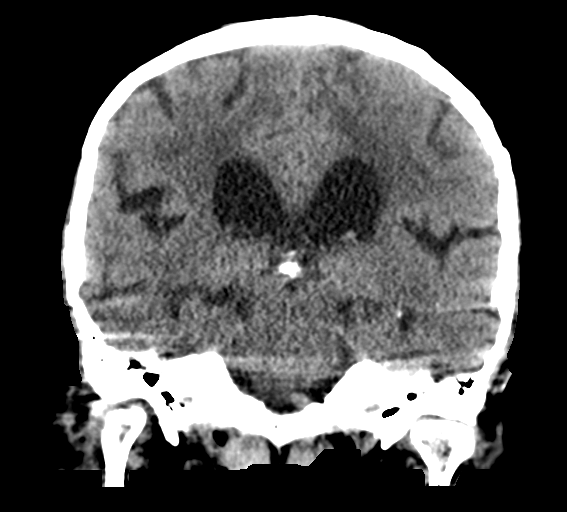
[im 37/67  brain]
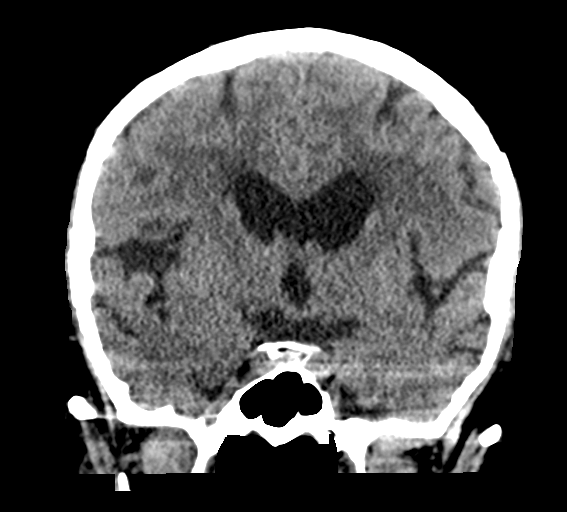

[Series 5: sagittal soft · sagittal · 0.34mm/px · 3 of 54 slices shown]
[im 18/54  brain]
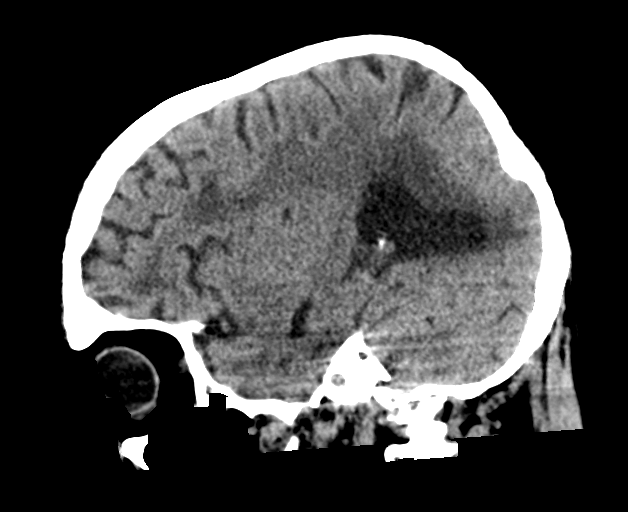
[im 27/54  brain]
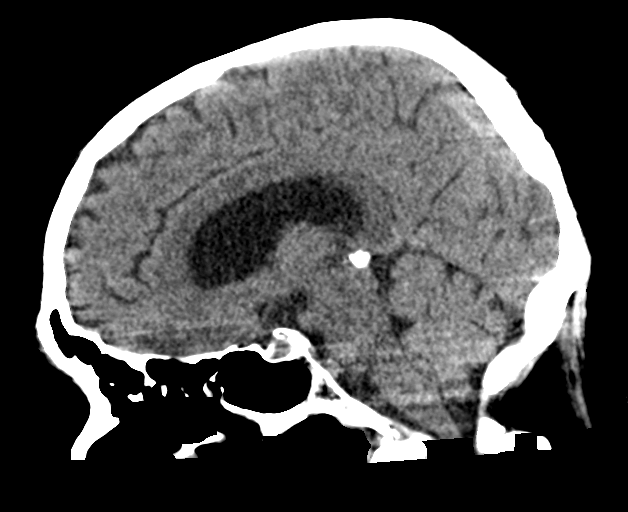
[im 36/54  brain]
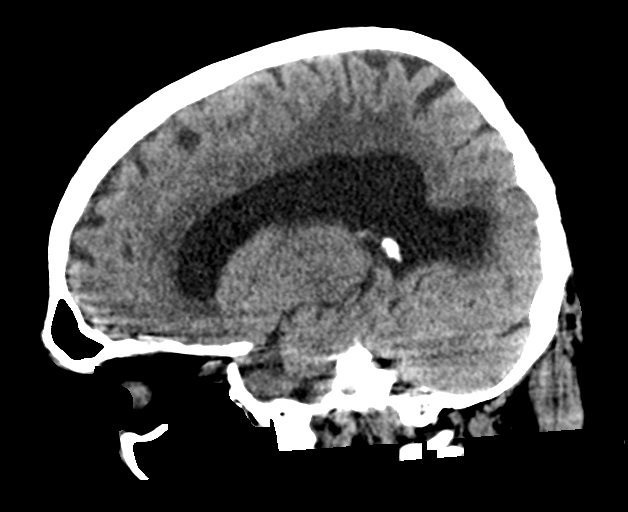

[13 of 47 positions shown; findings below may reference images not displayed]

FINDINGS: CT HEAD FINDINGS

Brain:

Mildly motion degraded exam.

Mild-to-moderate cerebral atrophy.

Redemonstrated small chronic cortical infarcts within the bilateral
frontal lobes, left parietal lobe and right occipital lobe.

Background advanced ill-defined hypoattenuation within the cerebral
white matter is nonspecific, but compatible with chronic small
vessel ischemic disease.

Known small chronic infarcts within the bilateral cerebellar
hemispheres better appreciated on the prior MRI of [DATE].

There is no acute intracranial hemorrhage.

No acute demarcated cortical infarct.

No extra-axial fluid collection.

No evidence of intracranial mass.

No midline shift.

Vascular: No hyperdense vessel.  Atherosclerotic calcifications.

Skull: Normal. Negative for fracture or focal lesion.

Sinuses/Orbits: Visualized orbits show no acute finding. Mild
frontal, ethmoid and maxillary sinus mucosal thickening at the
imaged levels. Small right maxillary sinus mucous retention cyst.

CT CERVICAL SPINE FINDINGS

Mildly motion degraded exam.

Alignment: Nonspecific reversal of the expected cervical lordosis.
Trace C2-C3 grade 1 anterolisthesis. Trace C5-C6 grade 1
retrolisthesis.

Skull base and vertebrae: The basion-dental and atlanto-dental
intervals are maintained.No evidence of acute fracture to the
cervical spine.

Soft tissues and spinal canal: No prevertebral fluid or swelling. No
visible canal hematoma.

Disc levels: Cervical spondylosis with multilevel disc space
narrowing, disc bulges, posterior disc osteophytes, uncovertebral
hypertrophy and facet arthrosis. At C4-C5, there is vertebral fusion
and right facet joint ankylosis. Disc space narrowing is severe at
C3-C4, C5-C6, C6-C7 and C7-T1. Small caudally migrated central disc
extrusion at C2-C3 contributing to apparent moderate spinal canal
stenosis. Multilevel up to moderate spinal canal stenosis at the
remaining levels. Multilevel bony neural foraminal narrowing. Bony
neural foraminal narrowing. Additionally, there is prominent
ligamentous hypertrophy/pannus formation posterior to the dens
contributing to mild C1-C2 canal narrowing.

Upper chest: Biapical bullous emphysema.
IMPRESSION: CT head:

1. Mildly motion degraded exam.
2. No evidence of acute intracranial abnormality.
3. Redemonstrated small chronic cortical infarcts within the
bilateral frontal lobes, left parietal and right occipital lobes.
4. Stable background mild-to-moderate cerebral atrophy and severe
chronic small vessel ischemic disease.
5. Known small chronic infarcts within the bilateral cerebellar
hemispheres.
6. Mild paranasal disease, as described.

CT cervical spine:

1. Mildly motion degraded exam.
2. No evidence of acute fracture to the cervical spine.
3. Nonspecific reversal of the expected cervical lordosis.
4. Minimal C2-C3 grade 1 anterolisthesis and C5-C6 grade 1
retrolisthesis.
5. Cervical spondylosis as described with up to moderate appreciable
spinal canal stenosis and multilevel bony neural foraminal
narrowing. Degenerative fusion at C4-C5.
6. Biapical bullous emphysema (emphysema ([RE]-[RE])).

## 2020-10-20 IMAGING — CT CT ANGIO CHEST
2 of 6 series · 18 of 46 positions shown · IV contrast (Omnipaque or Isovue)
Comparison: Chest CT dated [DATE].

CLINICAL DATA: 89-year-old male with syncope.

EXAM:
CT ANGIOGRAPHY CHEST WITH CONTRAST
TECHNIQUE: Multidetector CT imaging of the chest was performed using the
standard protocol during bolus administration of intravenous
contrast. Multiplanar CT image reconstructions and MIPs were
obtained to evaluate the vascular anatomy.
CONTRAST:  100mL OMNIPAQUE IOHEXOL 350 MG/ML SOLN

[Series 5: pe axial thins · axial · 0.67mm/px · z∈[+1216,+1491]mm · 15 of 303 slices shown]
[im 14/303  lung]
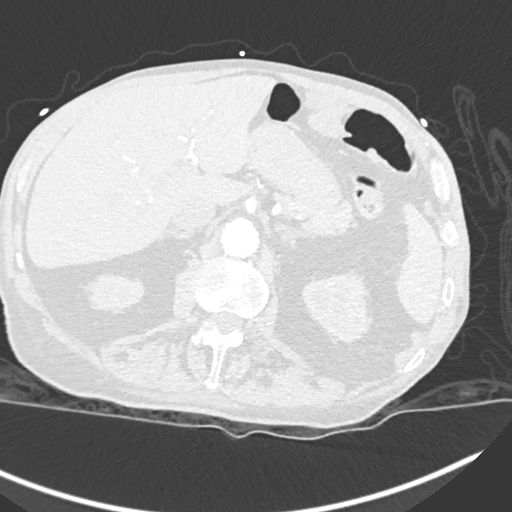
[im 40/303  soft-tissue]
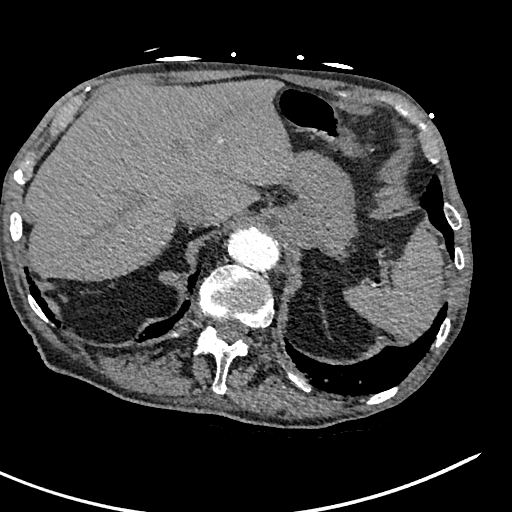
[im 53/303  lung]
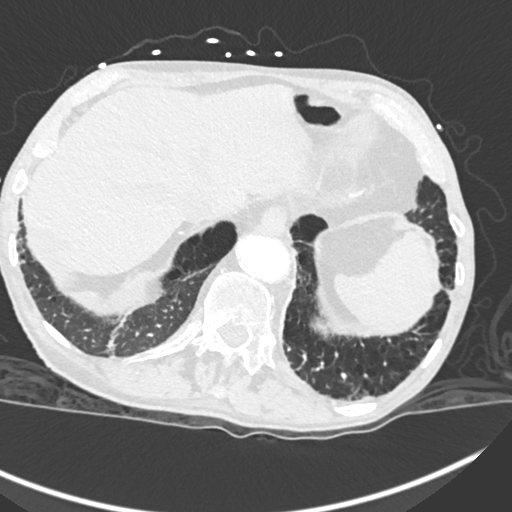
[im 79/303  soft-tissue]
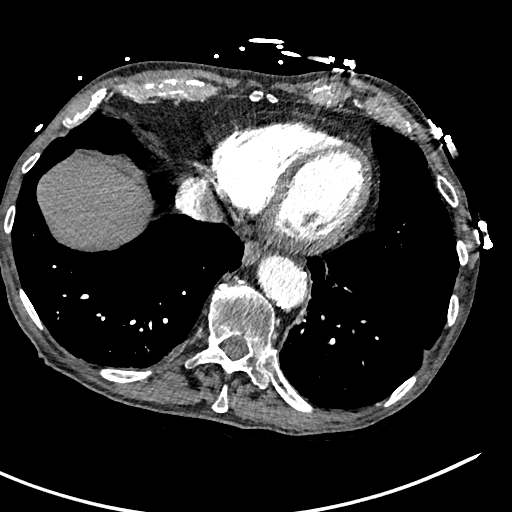
[im 92/303  lung]
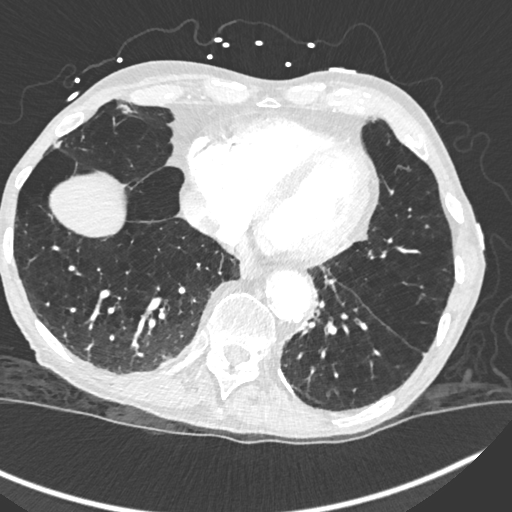
[im 119/303  soft-tissue]
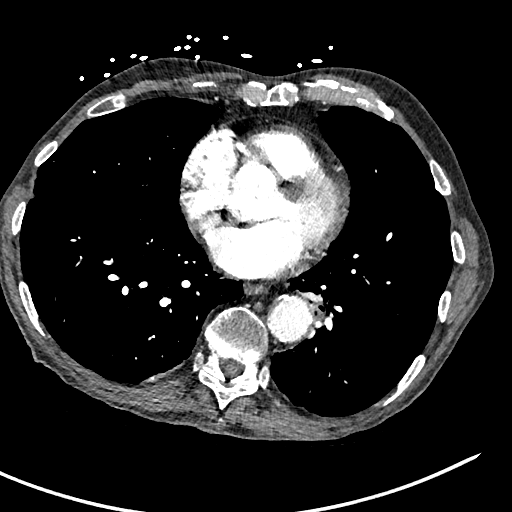
[im 132/303  lung]
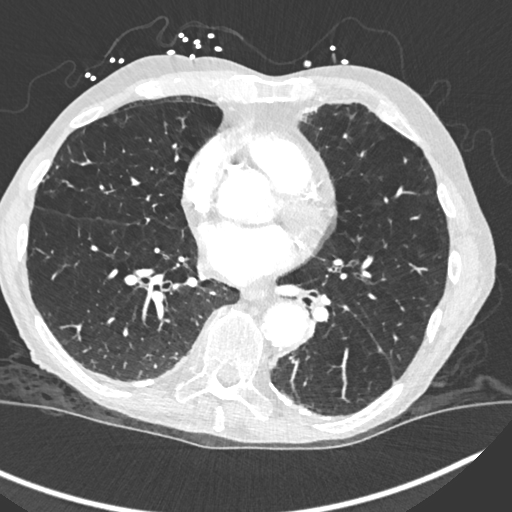
[im 158/303  soft-tissue]
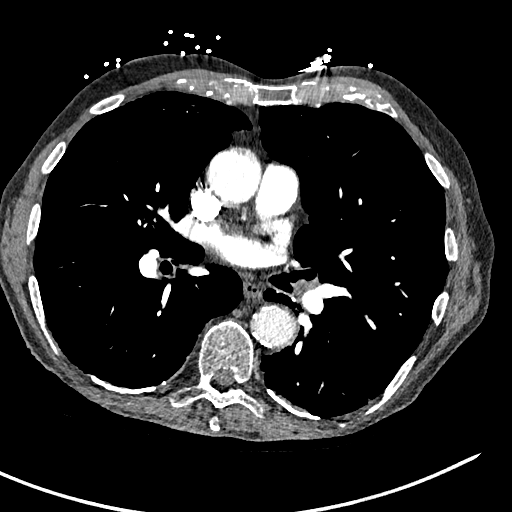
[im 171/303  lung]
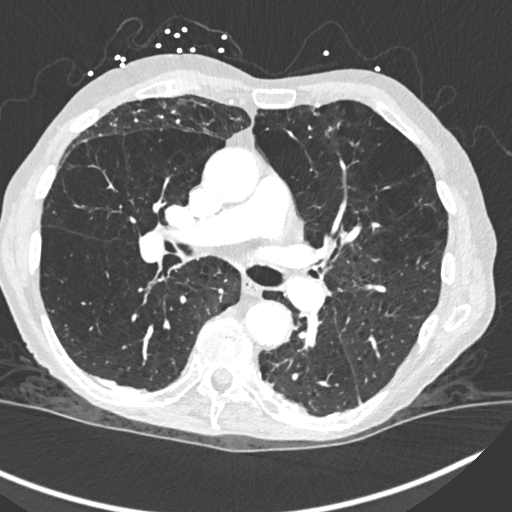
[im 184/303  soft-tissue]
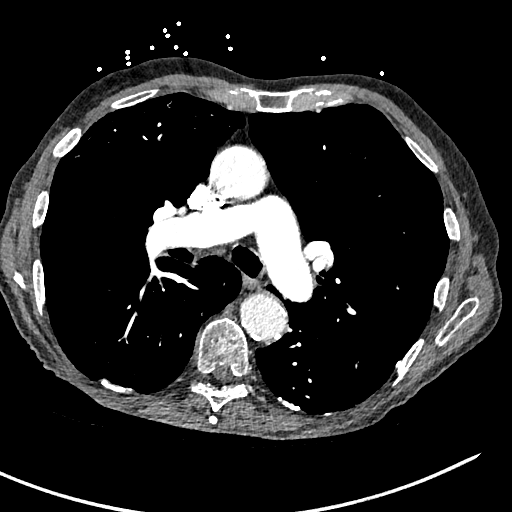
[im 211/303  lung]
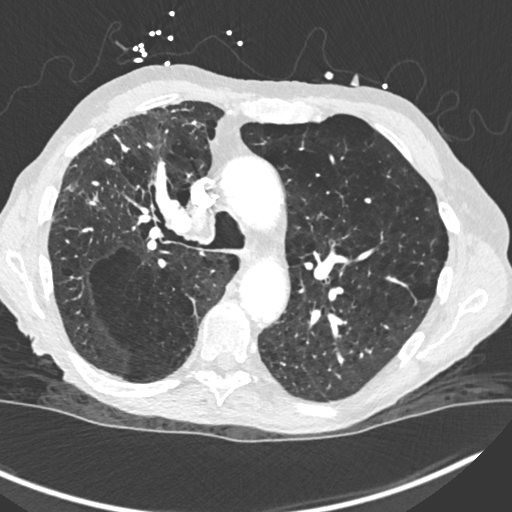
[im 224/303  soft-tissue]
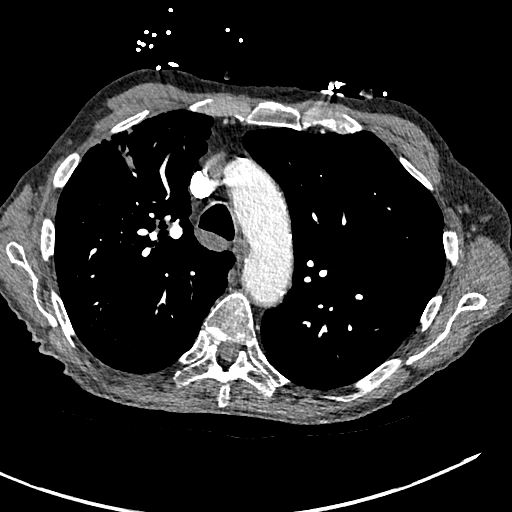
[im 250/303  lung]
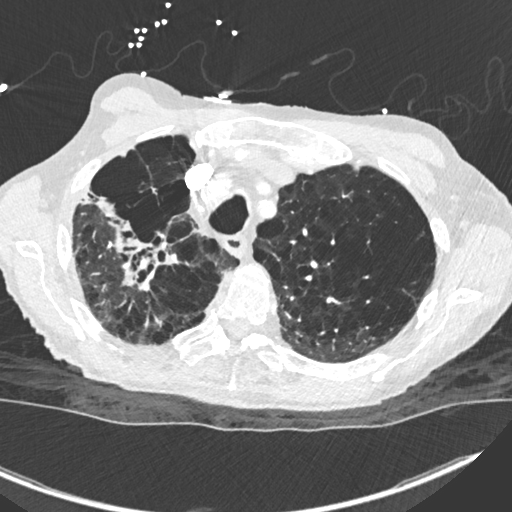
[im 263/303  soft-tissue]
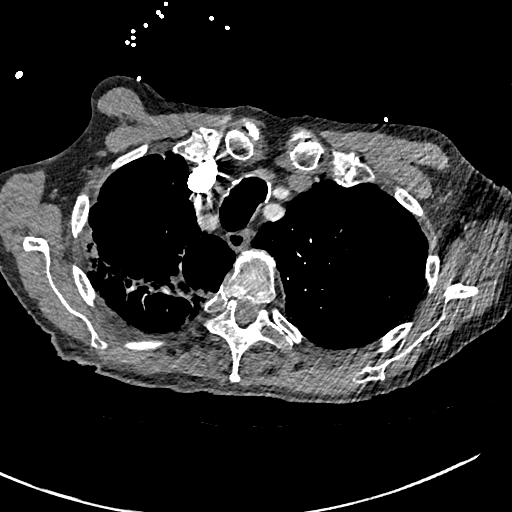
[im 289/303  lung]
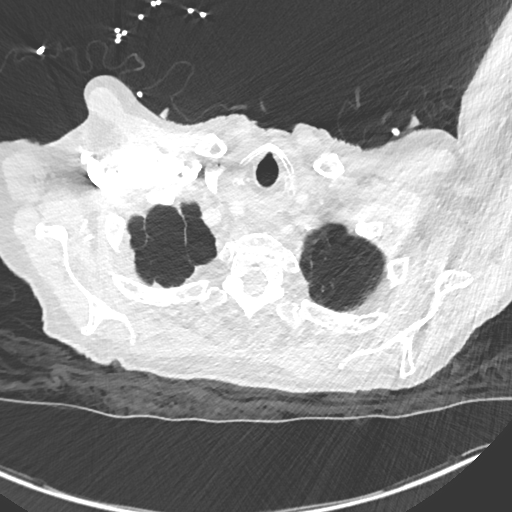

[Series 8: cor soft · coronal · 0.58mm/px · 3 of 126 slices shown]
[im 32/126  soft-tissue]
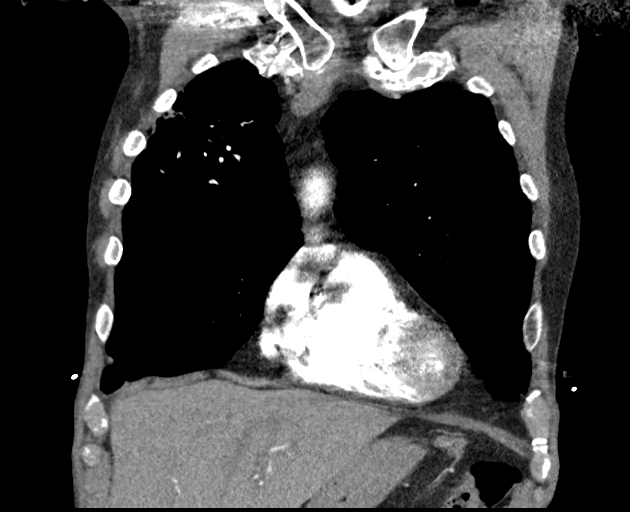
[im 63/126  soft-tissue]
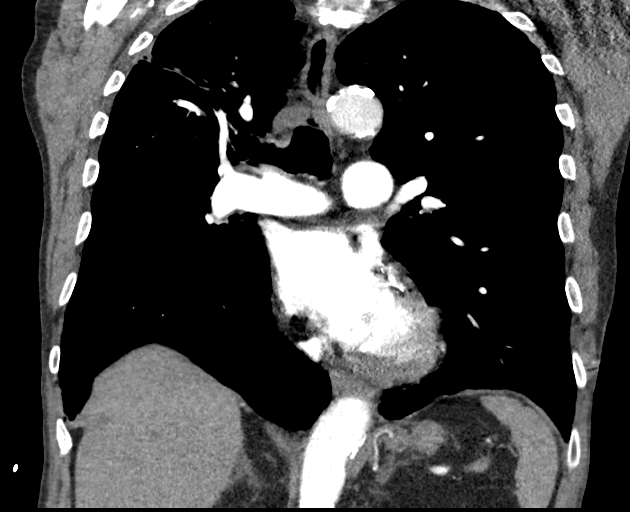
[im 94/126  soft-tissue]
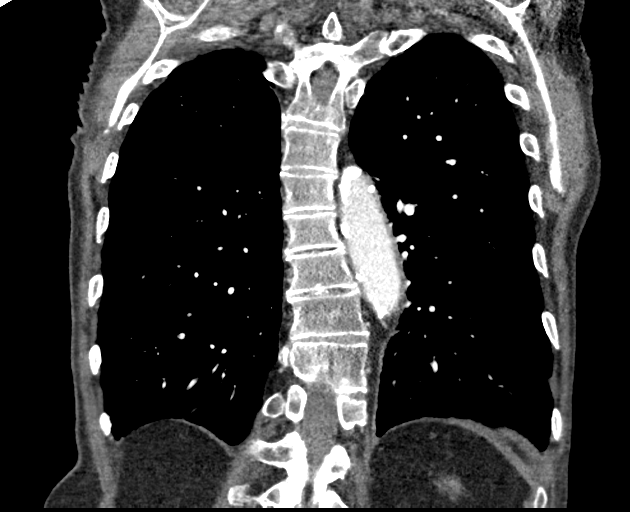

[18 of 46 positions shown; findings below may reference images not displayed]

FINDINGS: Cardiovascular: There is no cardiomegaly or pericardial effusion.
Three-vessel coronary vascular calcification. Advanced
atherosclerotic calcification of the thoracic aorta. No aneurysmal
dilatation or dissection. No pulmonary artery embolus identified.

Mediastinum/Nodes: No hilar or mediastinal adenopathy. The esophagus
is grossly unremarkable. No mediastinal fluid collection.

Lungs/Pleura: Severe emphysema. Right upper lobe scarring and
architectural distortion. There is a 6 mm left lower lobe nodule
(77/6). No consolidative changes. There is no pleural effusion
pneumothorax. Bilateral calcified pleural plaques. The central
airways are patent.

Upper Abdomen: No acute abnormality.

Musculoskeletal: Osteopenia with degenerative changes of the spine.
Old appearing bilateral rib fractures. No acute osseous pathology.

Review of the MIP images confirms the above findings.
IMPRESSION: 1. No acute intrathoracic pathology. No CT evidence of pulmonary
artery embolus.
2. Severe emphysema with right upper lobe scarring and architectural
distortion.
3. A 6 mm left lower lobe nodule. Non-contrast chest CT at 6-12
months is recommended. If the nodule is stable at time of repeat CT,
then future CT at 18-24 months (from today's scan) is considered
optional for low-risk patients, but is recommended for high-risk
patients. This recommendation follows the consensus statement:
Guidelines for Management of Incidental Pulmonary Nodules Detected
4. Aortic Atherosclerosis ([QQ]-[QQ]) and Emphysema ([QQ]-[QQ]).

## 2020-10-20 MED ORDER — ALBUTEROL SULFATE HFA 108 (90 BASE) MCG/ACT IN AERS
2.0000 | INHALATION_SPRAY | Freq: Once | RESPIRATORY_TRACT | Status: AC
  Administered 2020-10-20: 2 via RESPIRATORY_TRACT

## 2020-10-20 MED ORDER — AEROCHAMBER PLUS FLO-VU MISC: 1.0000 | Freq: Once | Status: DC

## 2020-10-20 MED ORDER — LIDOCAINE HCL (PF) 2 % IJ SOLN
INTRAMUSCULAR | Status: AC
  Filled 2020-10-20: qty 10

## 2020-10-20 MED ORDER — ALBUTEROL SULFATE HFA 108 (90 BASE) MCG/ACT IN AERS: 8.0000 | INHALATION_SPRAY | Freq: Once | RESPIRATORY_TRACT | Status: DC

## 2020-10-20 MED ORDER — TETANUS-DIPHTH-ACELL PERTUSSIS 5-2.5-18.5 LF-MCG/0.5 IM SUSY
0.5000 mL | PREFILLED_SYRINGE | Freq: Once | INTRAMUSCULAR | Status: AC
  Administered 2020-10-20: 0.5 mL via INTRAMUSCULAR
  Filled 2020-10-20: qty 0.5

## 2020-10-20 MED ORDER — IOHEXOL 350 MG/ML SOLN
100.0000 mL | Freq: Once | INTRAVENOUS | Status: AC | PRN
  Administered 2020-10-20: 100 mL via INTRAVENOUS

## 2020-10-20 MED ORDER — LORAZEPAM 0.5 MG PO TABS: 0.5000 mg | ORAL_TABLET | Freq: Two times a day (BID) | ORAL | 0 refills | Status: DC | PRN

## 2020-10-20 MED ORDER — ALBUTEROL SULFATE HFA 108 (90 BASE) MCG/ACT IN AERS
INHALATION_SPRAY | RESPIRATORY_TRACT | Status: AC
  Filled 2020-10-20: qty 6.7

## 2020-10-20 MED ORDER — SODIUM CHLORIDE 0.9 % IV BOLUS
500.0000 mL | Freq: Once | INTRAVENOUS | Status: AC
  Administered 2020-10-20: 500 mL via INTRAVENOUS

## 2020-10-20 MED ORDER — LIDOCAINE-EPINEPHRINE 2 %-1:100000 IJ SOLN: 20.0000 mL | Freq: Once | INTRAMUSCULAR | Status: DC

## 2020-10-20 NOTE — ED Provider Notes (Signed)
  Face-to-face evaluation   History: Patient here for evaluation of falling and syncope.  He is currently in rehab following recent hip fracture and surgical repair.  Today he was found on floor, but unable to specify what happened.  He has been generally weak but progressing with rehab.  He is on chronic oxygen therapy for lung disease.  Today he injured his face when he fell.  Since he cannot recall the events it is suspected that he had syncope but there was no witness.  Patient has been well since that time earlier today.  There is been no other complaints or concerns.     Physical exam:Elderly alert male who is calm and cooperative.  No respiratory distress.  Good range of motion arms and legs bilaterally.  Few abrasions right forearm both dorsal and volar.  No dysarthria or aphasia.  Wound left forehead has been sutured.  No active bleeding   Medical screening examination/treatment/procedure(s) were conducted as a shared visit with non-physician practitioner(s) and myself.  I personally evaluated the patient during the encounter when he fell.     Daleen Bo, MD 10/24/20 1740

## 2020-10-20 NOTE — H&P (Signed)
TRH H&P    Patient Demographics:    John Black, is a 87 y.o. male  [name]: [id number]  DOB - 01-02-1929  Admit Date - 10/20/2020  Referring MD/NP/PA: Kenton Kingfisher   Outpatient Primary MD for the patient is [facility], Modena Nunnery, MD  Patient coming from: [facility]  Chief complaint- Fall   HPI:    John Black  is a 87 y.o. male, with hx of prostate cancer, hypothyroid, chronic respiratory failure, COPD, and recent hip hemiarthroplasty presents to the ED from the [facility] for syncope and collapse. Patient doesn't remember much of the event. He thinks he was in his wheelchair, going with the PT to the gym, when he woke up on the floor. He does not remember going down. Patient reports that he had some skin tears, but no pain. Patient reports no change in vision, hearing, headache, or neuro deficits. Prior to the fall, he was in his normal state of health. There was no chest pain, palpitations, dizziness, dyspnea, or other symptoms. Patient reports that he has been eating and drinking normally. He reports that his rameron works quite well to stimulate his appetite, and that he has gained 12 pounds since starting it. He reports that he had not had his ativan since then night prior. Patient reports no other symptoms.   Former smoker, drinks 1/2 glass wine per night - but hasn't had any at [facility], no illicit drugs. Full code.   Last hospitalized 1/23 - 1/26 after mechanical fall. Hemiarthroplasty 1/24 Advised to stay on 81mg  BID for 6 weeks.  [place] for rehab  In the ED T 97 heart rate 65-1 04, respiratory rate 16-29, blood pressure 128/68 -initially blood pressures were soft but after 500 cc bolus they have normalized, satting 95% on 4 L nasal cannula Hematology shows a slight leukocytosis at 11.1, hemoglobin 11.0, thrombocytosis at 444 -acute phase reactant Chemistry panel shows slight hyperkalemia at 5.3  glucose 137 Albumin 3.2 UA negative FOBT positive CT head - No acute intracranial abnormality CT cervical spine - no acute fracture Albuterol, NaCl 500cc, tdap given     Review of systems:    In addition to the HPI above,  No Fever-chills, No Headache, No changes with Vision or hearing, No problems swallowing food or Liquids, No Chest pain, Cough or Shortness of Breath, No Abdominal pain, No Nausea or Vomiting, bowel movements are regular, No Blood in stool or Urine, No dysuria, No new skin rashes, bruising over forearms and knees No new joints pains-aches,  No new weakness, tingling, numbness in any extremity, No recent weight gain or loss, No polyuria, polydypsia or polyphagia, No significant Mental Stressors.  All other systems reviewed and are negative.    Past History of the following :    Past Medical History:  Diagnosis Date  . Allergic rhinitis   . Cataract    s/p removal  . Chronic respiratory failure (HCC)    oxygen 3L at home  . Emphysema   . Hypothyroid   . PNA (pneumonia)   .  Prostate cancer [facility])       Past Surgical History:  Procedure Laterality Date  . ADENOIDECTOMY    . HIP ARTHROPLASTY Right 10/10/2020   Procedure: ARTHROPLASTY BIPOLAR HIP (HEMIARTHROPLASTY);  Surgeon: Mordecai Rasmussen, MD;  Location: [facility];  Service: Orthopedics;  Laterality: Right;  . ROTATOR CUFF REPAIR  1990,2009   bilateral  . Seed implant for prostate cancer  2000  . TONSILLECTOMY    . TRANSURETHRAL RESECTION OF PROSTATE  2001   x2      Social History:      Social History   Tobacco Use  . Smoking status: Former Smoker    Packs/day: 1.50    Years: 50.00    Pack years: 75.00    Types: Cigarettes    Quit date: 09/17/2008    Years since quitting: 12.0  . Smokeless tobacco: Never Used  Substance Use Topics  . Alcohol use: Yes    Alcohol/week: 1.0 standard drink    Types: 1 Glasses of wine per week    Comment: each evening       Family History :      Family History  Problem Relation Age of Onset  . Heart disease Mother   . Prostate cancer Father       Home Medications:   Prior to Admission medications   Medication Sig Start Date End Date Taking? Authorizing Provider  acetaminophen (TYLENOL) 325 MG tablet Take 650 mg by mouth every 6 (six) hours as needed for mild pain.   Yes [provider]  aspirin EC 81 MG tablet Take 81 mg by mouth. Twice a day from 10/12/2020-11/22/2020 Then take once a day starting 11/23/2020   Yes [provider]  Cholecalciferol 1.25 MG (50000 UT) capsule Take 50,000 Units by mouth once a week. On Wednesday 10/18/20  Yes [provider]  Fluticasone-Umeclidin-Vilant (TRELEGY ELLIPTA) 100-62.5-25 MCG/INH AEPB Inhale 1 puff into the lungs daily. 09/30/20  Yes Tanda Rockers, MD  guaiFENesin (MUCINEX) 600 MG 12 hr tablet Take 600 mg by mouth 2 (two) times daily as needed for cough.   Yes [provider]  levothyroxine (SYNTHROID) 75 MCG tablet TAKE 1 TABLET (75 MCG TOTAL) BY MOUTH DAILY. Patient taking differently: Take 75 mcg by mouth daily before breakfast. 04/07/20  Yes [place], Modena Nunnery, MD  LORazepam (ATIVAN) 0.5 MG tablet Take 1 tablet (0.5 mg total) by mouth 2 (two) times daily as needed for anxiety. 10/20/20  Yes Gerlene Fee, NP  mirtazapine (REMERON) 15 MG tablet TAKE 1/2 TABLET BY MOUTH IN THE EVENING 09/21/20  Yes [place], Modena Nunnery, MD  pantoprazole (PROTONIX) 40 MG tablet TAKE 1 TABLET BY MOUTH EVERY DAY 10/19/20  Yes Magdalen Spatz, NP  polyvinyl alcohol (LIQUIFILM TEARS) 1.4 % ophthalmic solution Place 1 drop into both eyes 3 (three) times daily as needed for dry eyes. 10/12/20  Yes [provider]  predniSONE (DELTASONE) 10 MG tablet Take 1 tablet (10 mg total) by mouth daily with breakfast. HOLD while on Prednisone taper 09/09/20  Yes Rai, Ripudeep K, MD  tamsulosin (FLOMAX) 0.4 MG CAPS capsule TAKE 1 CAPSULE BY MOUTH EVERY DAY 08/08/20  Yes [place], Modena Nunnery, MD   zolpidem (AMBIEN) 5 MG tablet TAKE 1 TABLET BY MOUTH EVERY DAY AT BEDTIME AS NEEDED FOR SLEEP 10/13/20  Yes Gerlene Fee, NP  HYDROcodone-acetaminophen (NORCO/VICODIN) 5-325 MG tablet Take 1 tablet by mouth every 6 (six) hours as needed for moderate pain.    [provider]  NON FORMULARY Diet: _____ Regular, ___x___ NAS, _______Consistent Carbohydrate, _______NPO _____Other 10/12/20   [provider]  OXYGEN Inhale 4 L into the lungs continuous. 10/12/20   [provider]     Allergies:     Allergies  Allergen Reactions  . Morphine     REACTION: sweats     Physical Exam:   Vitals  Blood pressure 128/68, pulse (!) 104, temperature 98 F (36.7 C), temperature source Oral, resp. rate (!) 29, height 5\' 6"  (1.676 m), weight 58 kg, SpO2 95 %.  1.  General: Laying supine in bed with head of bed elevated  2. Psychiatric: Mood and behavior normal for situation, alert and oriented x3  3. Neurologic: Face is symmetric, speech and language normal, moves all 4 extremities voluntarily, no focal deficit on limited exam  4. HEENMT:  Head is traumatic with bruising slight tear over his left eye, pupils reactive, neck is supple, trachea is midline, mucous membranes are moist  5. Respiratory : Lungs are clear to auscultation bilaterally without wheezing, rhonchi, rales  6. Cardiovascular : Heart rate is normal, rhythm is regular, no murmurs rubs or gallops, no peripheral edema  7. Gastrointestinal:  Abdomen is soft, nondistended, nontender to palpation  8. Skin:  Extensive bruising on forearms, multiple skin tears  9.Musculoskeletal:  No acute deformity, no calf tenderness, peripheral pulses palpated    Data Review:    CBC Recent Labs  Lab 10/20/20 1700  WBC 11.1*  HGB 11.0*  HCT 35.6*  PLT 444*  MCV 96.7  MCH 29.9  MCHC 30.9  RDW 14.4  LYMPHSABS 0.6*  MONOABS 0.6  EOSABS 0.0  BASOSABS 0.0    ------------------------------------------------------------------------------------------------------------------  Results for orders placed or performed during the hospital encounter of 10/20/20 (from the past 48 hour(s))  CBC WITH DIFFERENTIAL     Status: Abnormal   Collection Time: 10/20/20  5:00 PM  Result Value Ref Range   WBC 11.1 (H) 4.0 - 10.5 K/uL   RBC 3.68 (L) 4.22 - 5.81 MIL/uL   Hemoglobin 11.0 (L) 13.0 - 17.0 g/dL   HCT 35.6 (L) 39.0 - 52.0 %   MCV 96.7 80.0 - 100.0 fL   MCH 29.9 26.0 - 34.0 pg   MCHC 30.9 30.0 - 36.0 g/dL   RDW 14.4 11.5 - 15.5 %   Platelets 444 (H) 150 - 400 K/uL   nRBC 0.0 0.0 - 0.2 %   Neutrophils Relative % 80 %   Neutro Abs 8.9 (H) 1.7 - 7.7 K/uL   Lymphocytes Relative 5 %   Lymphs Abs 0.6 (L) 0.7 - 4.0 K/uL   Monocytes Relative 6 %   Monocytes Absolute 0.6 0.1 - 1.0 K/uL   Eosinophils Relative 0 %   Eosinophils Absolute 0.0 0.0 - 0.5 K/uL   Basophils Relative 0 %   Basophils Absolute 0.0 0.0 - 0.1 K/uL   Immature Granulocytes 9 %   Abs Immature Granulocytes 1.04 (H) 0.00 - 0.07 K/uL    Comment: Performed at [facility], [address], [place], [place] [address]  Comprehensive metabolic panel     Status: Abnormal   Collection Time: 10/20/20  5:00 PM  Result Value Ref Range   Sodium 133 (L) 135 - 145 mmol/L   Potassium 5.3 (H) 3.5 - 5.1 mmol/L   Chloride 97 (L) 98 - 111 mmol/L   CO2 29 22 - 32 mmol/L   Glucose, Bld 137 (H) 70 - 99 mg/dL    Comment: Glucose  reference range applies only to samples taken after fasting for at least 8 hours.   BUN 22 8 - 23 mg/dL   Creatinine, Ser 1.06 0.61 - 1.24 mg/dL   Calcium 8.9 8.9 - 10.3 mg/dL   Total Protein 6.3 (L) 6.5 - 8.1 g/dL   Albumin 3.2 (L) 3.5 - 5.0 g/dL   AST 36 15 - 41 U/L   ALT 51 (H) 0 - 44 U/L   Alkaline Phosphatase 69 38 - 126 U/L   Total Bilirubin 0.5 0.3 - 1.2 mg/dL   GFR, Estimated >60 >60 mL/min    Comment: (NOTE) Calculated using the CKD-EPI Creatinine Equation (2021)     Anion gap 7 5 - 15    Comment: Performed at [facility], [address], [place], [place] [address]  CBG monitoring, ED     Status: Abnormal   Collection Time: 10/20/20  6:43 PM  Result Value Ref Range   Glucose-Capillary 108 (H) 70 - 99 mg/dL    Comment: Glucose reference range applies only to samples taken after fasting for at least 8 hours.  Urinalysis, Routine w reflex microscopic Urine, Clean Catch     Status: None   Collection Time: 10/20/20  7:50 PM  Result Value Ref Range   Color, Urine YELLOW YELLOW   APPearance CLEAR CLEAR   Specific Gravity, Urine 1.015 1.005 - 1.030   pH 7.0 5.0 - 8.0   Glucose, UA NEGATIVE NEGATIVE mg/dL   Hgb urine dipstick NEGATIVE NEGATIVE   Bilirubin Urine NEGATIVE NEGATIVE   Ketones, ur NEGATIVE NEGATIVE mg/dL   Protein, ur NEGATIVE NEGATIVE mg/dL   Nitrite NEGATIVE NEGATIVE   Leukocytes,Ua NEGATIVE NEGATIVE    Comment: Performed at [facility], [address], [place], [place] [address]  POC occult blood, ED     Status: Abnormal   Collection Time: 10/20/20  8:00 PM  Result Value Ref Range   Fecal Occult Bld POSITIVE (A) NEGATIVE    Chemistries  Recent Labs  Lab 10/20/20 1700  NA 133*  K 5.3*  CL 97*  CO2 29  GLUCOSE 137*  BUN 22  CREATININE 1.06  CALCIUM 8.9  AST 36  ALT 51*  ALKPHOS 69  BILITOT 0.5   ------------------------------------------------------------------------------------------------------------------  ------------------------------------------------------------------------------------------------------------------ GFR: Estimated Creatinine Clearance: 38.8 mL/min (by C-G formula based on SCr of 1.06 mg/dL). Liver Function Tests: Recent Labs  Lab 10/20/20 1700  AST 36  ALT 51*  ALKPHOS 69  BILITOT 0.5  PROT 6.3*  ALBUMIN 3.2*   No results for input(s): LIPASE, AMYLASE in the last 168 hours. No results for input(s): AMMONIA in the last 168 hours. Coagulation Profile: No results for input(s): INR, PROTIME  in the last 168 hours. Cardiac Enzymes: No results for input(s): CKTOTAL, CKMB, CKMBINDEX, TROPONINI in the last 168 hours. BNP (last 3 results) No results for input(s): PROBNP in the last 8760 hours. HbA1C: No results for input(s): HGBA1C in the last 72 hours. CBG: Recent Labs  Lab 10/20/20 1843  GLUCAP 108*   Lipid Profile: No results for input(s): CHOL, HDL, LDLCALC, TRIG, CHOLHDL, LDLDIRECT in the last 72 hours. Thyroid Function Tests: No results for input(s): TSH, T4TOTAL, FREET4, T3FREE, THYROIDAB in the last 72 hours. Anemia Panel: No results for input(s): VITAMINB12, FOLATE, FERRITIN, TIBC, IRON, RETICCTPCT in the last 72 hours.  --------------------------------------------------------------------------------------------------------------- Urine analysis:    Component Value Date/Time   COLORURINE YELLOW 10/20/2020 1950   APPEARANCEUR CLEAR 10/20/2020 1950   LABSPEC 1.015 10/20/2020 1950   PHURINE 7.0  10/20/2020 1950   GLUCOSEU NEGATIVE 10/20/2020 1950   HGBUR NEGATIVE 10/20/2020 1950   BILIRUBINUR NEGATIVE 10/20/2020 1950   KETONESUR NEGATIVE 10/20/2020 1950   PROTEINUR NEGATIVE 10/20/2020 1950   UROBILINOGEN 0.2 10/02/2013 1208   NITRITE NEGATIVE 10/20/2020 1950   LEUKOCYTESUR NEGATIVE 10/20/2020 1950      Imaging Results:    CT HEAD WO CONTRAST  Result Date: 10/20/2020 CLINICAL DATA:  Facial trauma. Additional provided: Fall, swelling above left eye, no loss of consciousness, alert and oriented. EXAM: CT HEAD WITHOUT CONTRAST CT CERVICAL SPINE WITHOUT CONTRAST TECHNIQUE: Multidetector CT imaging of the head and cervical spine was performed following the standard protocol without intravenous contrast. Multiplanar CT image reconstructions of the cervical spine were also generated. COMPARISON:  Brain MRI/MRA 09/09/2020.  Head CT 09/08/2020. FINDINGS: CT HEAD FINDINGS Brain: Mildly motion degraded exam. Mild-to-moderate cerebral atrophy. Redemonstrated small chronic  cortical infarcts within the bilateral frontal lobes, left parietal lobe and right occipital lobe. Background advanced ill-defined hypoattenuation within the cerebral white matter is nonspecific, but compatible with chronic small vessel ischemic disease. Known small chronic infarcts within the bilateral cerebellar hemispheres better appreciated on the prior MRI of 09/09/2020. There is no acute intracranial hemorrhage. No acute demarcated cortical infarct. No extra-axial fluid collection. No evidence of intracranial mass. No midline shift. Vascular: No hyperdense vessel.  Atherosclerotic calcifications. Skull: Normal. Negative for fracture or focal lesion. Sinuses/Orbits: Visualized orbits show no acute finding. Mild frontal, ethmoid and maxillary sinus mucosal thickening at the imaged levels. Small right maxillary sinus mucous retention cyst. CT CERVICAL SPINE FINDINGS Mildly motion degraded exam. Alignment: Nonspecific reversal of the expected cervical lordosis. Trace C2-C3 grade 1 anterolisthesis. Trace C5-C6 grade 1 retrolisthesis. Skull base and vertebrae: The basion-dental and atlanto-dental intervals are maintained.No evidence of acute fracture to the cervical spine. Soft tissues and spinal canal: No prevertebral fluid or swelling. No visible canal hematoma. Disc levels: Cervical spondylosis with multilevel disc space narrowing, disc bulges, posterior disc osteophytes, uncovertebral hypertrophy and facet arthrosis. At C4-C5, there is vertebral fusion and right facet joint ankylosis. Disc space narrowing is severe at C3-C4, C5-C6, C6-C7 and C7-T1. Small caudally migrated central disc extrusion at C2-C3 contributing to apparent moderate spinal canal stenosis. Multilevel up to moderate spinal canal stenosis at the remaining levels. Multilevel bony neural foraminal narrowing. Bony neural foraminal narrowing. Additionally, there is prominent ligamentous hypertrophy/pannus formation posterior to the dens  contributing to mild C1-C2 canal narrowing. Upper chest: Biapical bullous emphysema. IMPRESSION: CT head: 1. Mildly motion degraded exam. 2. No evidence of acute intracranial abnormality. 3. Redemonstrated small chronic cortical infarcts within the bilateral frontal lobes, left parietal and right occipital lobes. 4. Stable background mild-to-moderate cerebral atrophy and severe chronic small vessel ischemic disease. 5. Known small chronic infarcts within the bilateral cerebellar hemispheres. 6. Mild paranasal disease, as described. CT cervical spine: 1. Mildly motion degraded exam. 2. No evidence of acute fracture to the cervical spine. 3. Nonspecific reversal of the expected cervical lordosis. 4. Minimal C2-C3 grade 1 anterolisthesis and C5-C6 grade 1 retrolisthesis. 5. Cervical spondylosis as described with up to moderate appreciable spinal canal stenosis and multilevel bony neural foraminal narrowing. Degenerative fusion at C4-C5. 6. Biapical bullous emphysema (emphysema (ICD10-J43.9)). Electronically Signed   By: Kellie Simmering DO   On: 10/20/2020 18:33   CT CERVICAL SPINE WO CONTRAST  Result Date: 10/20/2020 CLINICAL DATA:  Facial trauma. Additional provided: Fall, swelling above left eye, no loss of consciousness, alert and oriented. EXAM: CT HEAD WITHOUT CONTRAST  CT CERVICAL SPINE WITHOUT CONTRAST TECHNIQUE: Multidetector CT imaging of the head and cervical spine was performed following the standard protocol without intravenous contrast. Multiplanar CT image reconstructions of the cervical spine were also generated. COMPARISON:  Brain MRI/MRA 09/09/2020.  Head CT 09/08/2020. FINDINGS: CT HEAD FINDINGS Brain: Mildly motion degraded exam. Mild-to-moderate cerebral atrophy. Redemonstrated small chronic cortical infarcts within the bilateral frontal lobes, left parietal lobe and right occipital lobe. Background advanced ill-defined hypoattenuation within the cerebral white matter is nonspecific, but compatible  with chronic small vessel ischemic disease. Known small chronic infarcts within the bilateral cerebellar hemispheres better appreciated on the prior MRI of 09/09/2020. There is no acute intracranial hemorrhage. No acute demarcated cortical infarct. No extra-axial fluid collection. No evidence of intracranial mass. No midline shift. Vascular: No hyperdense vessel.  Atherosclerotic calcifications. Skull: Normal. Negative for fracture or focal lesion. Sinuses/Orbits: Visualized orbits show no acute finding. Mild frontal, ethmoid and maxillary sinus mucosal thickening at the imaged levels. Small right maxillary sinus mucous retention cyst. CT CERVICAL SPINE FINDINGS Mildly motion degraded exam. Alignment: Nonspecific reversal of the expected cervical lordosis. Trace C2-C3 grade 1 anterolisthesis. Trace C5-C6 grade 1 retrolisthesis. Skull base and vertebrae: The basion-dental and atlanto-dental intervals are maintained.No evidence of acute fracture to the cervical spine. Soft tissues and spinal canal: No prevertebral fluid or swelling. No visible canal hematoma. Disc levels: Cervical spondylosis with multilevel disc space narrowing, disc bulges, posterior disc osteophytes, uncovertebral hypertrophy and facet arthrosis. At C4-C5, there is vertebral fusion and right facet joint ankylosis. Disc space narrowing is severe at C3-C4, C5-C6, C6-C7 and C7-T1. Small caudally migrated central disc extrusion at C2-C3 contributing to apparent moderate spinal canal stenosis. Multilevel up to moderate spinal canal stenosis at the remaining levels. Multilevel bony neural foraminal narrowing. Bony neural foraminal narrowing. Additionally, there is prominent ligamentous hypertrophy/pannus formation posterior to the dens contributing to mild C1-C2 canal narrowing. Upper chest: Biapical bullous emphysema. IMPRESSION: CT head: 1. Mildly motion degraded exam. 2. No evidence of acute intracranial abnormality. 3. Redemonstrated small chronic  cortical infarcts within the bilateral frontal lobes, left parietal and right occipital lobes. 4. Stable background mild-to-moderate cerebral atrophy and severe chronic small vessel ischemic disease. 5. Known small chronic infarcts within the bilateral cerebellar hemispheres. 6. Mild paranasal disease, as described. CT cervical spine: 1. Mildly motion degraded exam. 2. No evidence of acute fracture to the cervical spine. 3. Nonspecific reversal of the expected cervical lordosis. 4. Minimal C2-C3 grade 1 anterolisthesis and C5-C6 grade 1 retrolisthesis. 5. Cervical spondylosis as described with up to moderate appreciable spinal canal stenosis and multilevel bony neural foraminal narrowing. Degenerative fusion at C4-C5. 6. Biapical bullous emphysema (emphysema (ICD10-J43.9)). Electronically Signed   By: Kellie Simmering DO   On: 10/20/2020 18:33    My personal review of EKG: Rhythm NSR, Rate 84 /min, QTc 416,no Acute ST changes   Assessment & Plan:    Principal Problem:   Syncope and collapse Active Problems:   COPD GOLD 3/ group D  02 dep   Hypothyroidism   Chronic respiratory failure with hypoxia (HCC)   Mild protein-calorie malnutrition ([place])   1. Syncope and collapse 1. No recollection of the event 2. No acute changes on CT head 3. No acute fracture on CT neck 4. EKG shows normal sinus rhythm 5. Monitor on tele 6. Echo in the AM 7. US carotid duplex BL 8. Orthostatic vitals q shift 9. CTA pending 2. Mild protein cal mal 1. Feeding supplement between meals 2.  Continue to monitor 3. Alb 3.2 3. Normocytic anemia 1. Hgb drop from 12.9 to 11.0 2. Patient did have surgery recently 3. Also has hemorrhoids 4. FOBT positive - with brown stool 5. If it continues to drop - consider GI consult 4. COPD 1. Continue trelegy 2. Clinically not in exacerbation at this time 5. GERD 1. Continue PPI 6. Hypothyroidism 1. Last TSH 1.104 in Jun 2021 2. TSH in the AM 3. Continue  synthroid 7. Chronic respiratory failure with hypoxia 1. Continue 4L [place] 8.    DVT Prophylaxis-  Heparin - SCDs   AM Labs Ordered, also please review Full Orders  Family Communication: Admission, patients condition and plan of care including tests being ordered have been discussed with the patient and wife who indicate understanding and agree with the plan and Code Status.  Code Status:  Full  Admission status: Observation  Time spent in minutes : [place]

## 2020-10-20 NOTE — ED Provider Notes (Signed)
[facility] EMERGENCY DEPARTMENT Provider Note   CSN: [id number] Arrival date & time: 10/20/20  1556     History Chief Complaint  Patient presents with  . Fall    John Black is a 87yoM with PMH significant for COPD on home O2 4LNC, HLD, GERD, hypothyroidism, TIA, and hip fracture two weeks ago now at [facility] rehab s/p right hemiarthroplasty who presents for mechanical fall. History is obtained from the patient, spouse, EMR and paper chart sent with him. Per the patient he only recalls being on the floor in the hallway. He does not recall whether he hit his head or had LOC; however, he has a 1.5cm laceration on the left eyebrow and new skin tear on the left elbow. Wife was initially concerned that he had taken Ativan this morning, however, on review on medication administration sent from facility he last received 0.5mg  Ativan last night around 1800. He has noticed some dark stools for an unknown amount of time. He denies abominal pain or hx of GI bleeds.He denies CP, palpitations, SOB, lightheadedness or dizziness. He denies recent illness, fevers. He does not take blood thinners other than 81mg  ASA.     HPI     Past Medical History:  Diagnosis Date  . Allergic rhinitis   . Cataract    s/p removal  . Chronic respiratory failure (HCC)    oxygen 3L at home  . Emphysema   . Hypothyroid   . PNA (pneumonia)   . Prostate cancer [facility])     Patient Active Problem List   Diagnosis Date Noted  . Hyperglycemia 10/19/2020  . Aortic atherosclerosis ([place]) 10/15/2020  . GERD without esophagitis 10/15/2020  . Acute blood loss as cause of postoperative anemia 10/15/2020  . Vitamin D deficiency 10/15/2020  . Closed right hip fracture ([place]) 10/09/2020  . Dysphagia 07/24/2019  . Protein calorie malnutrition ([place]) 02/24/2019  . Gait instability 01/27/2019  . Right inguinal hernia   . Chronic diarrhea 12/06/2017  . Inguinal hernia 03/02/2014  . Constipation 03/02/2014  . Chronic  respiratory failure with hypoxia ([place]) 12/09/2013  . Hemorrhoids 09/28/2013  . Hyperlipidemia 06/28/2013  . Sinusitis, chronic 11/28/2012  . COPD with acute exacerbation ([place]) 11/10/2012  . BPH (benign prostatic hyperplasia) 10/22/2012  . Insomnia 10/22/2012  . Pneumonia 09/05/2012  . Hypothyroidism 06/19/2012  . Atypical nevi 06/19/2012  . Seborrheic keratosis 06/19/2012  . History of prostate cancer 11/07/2010  . Allergic rhinitis 11/07/2010  . COPD GOLD 3/ group D  02 dep 11/07/2010    Past Surgical History:  Procedure Laterality Date  . ADENOIDECTOMY    . HIP ARTHROPLASTY Right 10/10/2020   Procedure: ARTHROPLASTY BIPOLAR HIP (HEMIARTHROPLASTY);  Surgeon: Mordecai Rasmussen, MD;  Location: [facility];  Service: Orthopedics;  Laterality: Right;  . ROTATOR CUFF REPAIR  1990,2009   bilateral  . Seed implant for prostate cancer  2000  . TONSILLECTOMY    . TRANSURETHRAL RESECTION OF PROSTATE  2001   x2       Family History  Problem Relation Age of Onset  . Heart disease Mother   . Prostate cancer Father     Social History   Tobacco Use  . Smoking status: Former Smoker    Packs/day: 1.50    Years: 50.00    Pack years: 75.00    Types: Cigarettes    Quit date: 09/17/2008    Years since quitting: 12.0  . Smokeless tobacco: Never Used  Vaping Use  . Vaping Use: Never used  Substance Use Topics  . Alcohol use: Yes    Alcohol/week: 1.0 standard drink    Types: 1 Glasses of wine per week    Comment: each evening  . Drug use: No    Home Medications Prior to Admission medications   Medication Sig Start Date End Date Taking? Authorizing Provider  acetaminophen (TYLENOL) 325 MG tablet Take 650 mg by mouth every 6 (six) hours as needed for mild pain.    [provider]  aspirin EC 81 MG tablet Take 81 mg by mouth. Twice a day from 10/12/2020-11/22/2020 Then take once a day starting 11/23/2020    [provider]  Cholecalciferol 1.25 MG (50000 UT) capsule Take 50,000  Units by mouth once a week. On Wednesday 10/18/20   [provider]  Fluticasone-Umeclidin-Vilant (TRELEGY ELLIPTA) 100-62.5-25 MCG/INH AEPB Inhale 1 puff into the lungs daily. 09/30/20   Tanda Rockers, MD  guaiFENesin (MUCINEX) 600 MG 12 hr tablet Take 600 mg by mouth 2 (two) times daily as needed for cough.    [provider]  levothyroxine (SYNTHROID) 75 MCG tablet TAKE 1 TABLET (75 MCG TOTAL) BY MOUTH DAILY. 04/07/20   [place], Modena Nunnery, MD  LORazepam (ATIVAN) 0.5 MG tablet Take 1 tablet (0.5 mg total) by mouth 2 (two) times daily as needed for anxiety. 10/20/20   Gerlene Fee, NP  mirtazapine (REMERON) 15 MG tablet TAKE 1/2 TABLET BY MOUTH IN THE EVENING 09/21/20   [place], Modena Nunnery, MD  NON FORMULARY Diet: _____ Regular, ___x___ NAS, _______Consistent Carbohydrate, _______NPO _____Other 10/12/20   [provider]  OXYGEN Inhale 4 L into the lungs continuous. 10/12/20   [provider]  pantoprazole (PROTONIX) 40 MG tablet TAKE 1 TABLET BY MOUTH EVERY DAY 10/19/20   Magdalen Spatz, NP  polyvinyl alcohol (LIQUIFILM TEARS) 1.4 % ophthalmic solution Place 1 drop into both eyes 3 (three) times daily as needed for dry eyes. 10/12/20   [provider]  predniSONE (DELTASONE) 10 MG tablet Take 1 tablet (10 mg total) by mouth daily with breakfast. HOLD while on Prednisone taper 09/09/20   Rai, Ripudeep K, MD  tamsulosin (FLOMAX) 0.4 MG CAPS capsule TAKE 1 CAPSULE BY MOUTH EVERY DAY 08/08/20   , Modena Nunnery, MD  zolpidem (AMBIEN) 5 MG tablet TAKE 1 TABLET BY MOUTH EVERY DAY AT BEDTIME AS NEEDED FOR SLEEP 10/13/20   Gerlene Fee, NP    Allergies    Morphine  Review of Systems   Review of Systems Ten systems reviewed and are negative for acute change, except as noted in the HPI.   Physical Exam Updated Vital Signs BP 129/66 (BP Location: Right Arm)   Pulse 97   Temp 98 F (36.7 C) (Oral)   Resp 18   Ht 5\' 6"  (1.676 m)   Wt 58 kg   SpO2 98%    BMI 20.64 kg/m   Physical Exam Vitals and nursing note reviewed.  Constitutional:      General: He is not in acute distress.    Appearance: He is well-developed and well-nourished. He is not diaphoretic.  HENT:     Head: Normocephalic.     Comments: Stellate laceration over the L eyebrow Moves eyes without pain, no obvious deformities Eyes:     General: No scleral icterus.    Conjunctiva/sclera: Conjunctivae normal.  Cardiovascular:     Rate and Rhythm: Normal rate and regular rhythm.     Heart sounds: Normal heart sounds.  Pulmonary:  Effort: Pulmonary effort is normal. No respiratory distress.     Breath sounds: Normal breath sounds.  Abdominal:     Palpations: Abdomen is soft.     Tenderness: There is no abdominal tenderness.  Musculoskeletal:        General: No edema.     Cervical back: Normal range of motion and neck supple.     Comments: Moves upper extremities with ease Multiple skin tears of both arms No active bleeding  Skin:    General: Skin is warm and dry.  Neurological:     Mental Status: He is alert.  Psychiatric:        Behavior: Behavior normal.     ED Results / Procedures / Treatments   Labs (all labs ordered are listed, but only abnormal results are displayed) Labs Reviewed  CBC WITH DIFFERENTIAL/PLATELET  COMPREHENSIVE METABOLIC PANEL  URINALYSIS, ROUTINE W REFLEX MICROSCOPIC  CBG MONITORING, ED    EKG None  Radiology No results found.  Procedures .Marland KitchenLaceration Repair  Date/Time: 10/21/2020 10:39 AM Performed by: Margarita Mail, PA-C Authorized by: Margarita Mail, PA-C   Consent:    Consent obtained:  Verbal   Consent given by:  Patient   Risks discussed:  Infection, need for additional repair, pain, poor cosmetic result and poor wound healing   Alternatives discussed:  No treatment and delayed treatment Universal protocol:    Procedure explained and questions answered to patient or proxy's satisfaction: yes     Relevant  documents present and verified: yes     Test results available: yes     Imaging studies available: yes     Required blood products, implants, devices, and special equipment available: yes     Site/side marked: yes     Immediately prior to procedure, a time out was called: yes     Patient identity confirmed:  Verbally with patient Anesthesia:    Anesthesia method:  Local infiltration Laceration details:    Location:  Face   Face location:  L eyebrow   Length (cm):  4   Depth (mm):  6 Exploration:    Wound exploration: wound explored through full range of motion and entire depth of wound visualized     Wound extent: no foreign bodies/material noted and no muscle damage noted   Treatment:    Area cleansed with:  Povidone-iodine   Amount of cleaning:  Standard   Irrigation solution:  Sterile saline   Irrigation method:  Syringe Skin repair:    Repair method:  Sutures   Suture size:  5-0   Wound skin closure material used: Vicryl rapide.   Suture technique:  Simple interrupted   Number of sutures:  4 Approximation:    Approximation:  Close Repair type:    Repair type:  Simple Post-procedure details:    Procedure completion:  Tolerated well, no immediate complications     Medications Ordered in ED Medications  sodium chloride 0.9 % bolus 500 mL (has no administration in time range)  Tdap (BOOSTRIX) injection 0.5 mL (has no administration in time range)    ED Course  I have reviewed the triage vital signs and the nursing notes.  Pertinent labs & imaging results that were available during my care of the patient were reviewed by me and considered in my medical decision making (see chart for details).  Clinical Course as of 10/21/20 1038  Thu Oct 20, 2020  2122 Hemoglobin(!): 11.0 [AH]    Clinical Course User Index [AH] Margarita Mail, PA-C  MDM Rules/Calculators/A&P                          UE:KCMK (LOC) VS: temp 98  87 23(Abnormal)  98/55(Abnormal)  97 %     LK:JZPHXTA is gathered by patient and wife. Previous records obtained and reviewed. DDX:The patient's complaint of syncope involves an extensive number of diagnostic and treatment options, and is a complaint that carries with it a high risk of complications, morbidity, and potential mortality. Given the large differential diagnosis, medical decision making is of high complexity. The differential for syncope is extensive and includes, but is not limited to: arrythmia (Vtach, SVT, SSS, sinus arrest, AV block, bradycardia) aortic stenosis, AMI, HOCM, PE, atrial myxoma, pulmonary hypertension, orthostatic hypotension, (hypovolemia, drug effect, GB syndrome, micturition, cough, swall) carotid sinus sensitivity, Seizure, TIA/CVA, hypoglycemia,  Vertigo.  Labs: I ordered reviewed and interpreted labs which include CBC with slightly elevated white blood cell count of 11.1.  Hemoglobin has dropped from 12.9 down to 11.  Patient does have a positive occult stool however he does have obvious hemorrhoids some which appear to be a bit bloody stool does not appear melenic I think this is contributing to the positive result.  CMP shows no significant abnormalities, urinalysis within normal limits Covid test is pending Imaging: I ordered and reviewed images which included CT head and C-spine. I independently visualized and interpreted all imaging.There are no acute, significant findings on today's images. EKG: Ventricular rate at a rate of 84 without evidence of arrhythmia Consults: DR. Z.G. for admission MDM: 87 year old male here with fall, laceration to the forehead, recent surgery and syncope.  Patient has a CT angiogram pending to rule out pulmonary embolus as the cause.  He is not low risk per [facility] syncope score.  Patient will be admitted to the hospitalist service.  Case discussed with Dr. Bess Kinds.  Of try regional hospitalist will admit the patient.  Seen and shared visit with Dr. Eulis Foster. Patient  disposition:The patient appears reasonably stabilized for admission considering the current resources, flow, and capabilities available in the ED at this time, and I doubt any other [facility] requiring further screening and/or treatment in the ED prior to admission.        Final Clinical Impression(s) / ED Diagnoses Final diagnoses:  None    Rx / DC Orders ED Discharge Orders    None       Margarita Mail, PA-C 10/21/20 1050    Daleen Bo, MD 10/24/20 1740

## 2020-10-20 NOTE — ED Notes (Addendum)
Pt got very SOB upon standing, O2 dropped into the 80s on 4L [place].

## 2020-10-20 NOTE — ED Notes (Signed)
Pt to CT

## 2020-10-20 NOTE — ED Triage Notes (Signed)
Pt fell ambulating at [facility] center.  Pt has a bump above the left eye and left elbow.  Pt had no LOC.  Pt alert and oriented.

## 2020-10-20 NOTE — Assessment & Plan Note (Signed)
Extremity pain significantly improved. Compliant with PT/OT @ SNF.

## 2020-10-20 NOTE — Progress Notes (Signed)
Location:  [place] Room Number: 155/P Place of Service:  SNF (31)   CODE STATUS: Full Code  Allergies  Allergen Reactions  . Morphine     REACTION: sweats    Chief Complaint  Patient presents with  . Short Term Rehab (STR)          Closed right hip fracture with routine healing subsequent encounter      Aortic atherosclerosis   GOLD COPD 3/group d.   Weekly follow up for the first 30 days post hospitalization     HPI:  He is a 87 year old short term rehab patient being seen for the management of his chronic illnesses: Closed right hip fracture with routine healing subsequent encounter     Aortic atherosclerosis   GOLD COPD 3/group d. He is continuing to participate for his right hip fracture. There are no reports of uncontrolled pain; no reports of coughing or shortness of breath no reports of constipation.   Past Medical History:  Diagnosis Date  . Allergic rhinitis   . Cataract    s/p removal  . Chronic respiratory failure (HCC)    oxygen 3L at home  . Emphysema   . Hypothyroid   . PNA (pneumonia)   . Prostate cancer [facility])     Past Surgical History:  Procedure Laterality Date  . ADENOIDECTOMY    . HIP ARTHROPLASTY Right 10/10/2020   Procedure: ARTHROPLASTY BIPOLAR HIP (HEMIARTHROPLASTY);  Surgeon: Mordecai Rasmussen, MD;  Location: [facility];  Service: Orthopedics;  Laterality: Right;  . ROTATOR CUFF REPAIR  1990,2009   bilateral  . Seed implant for prostate cancer  2000  . TONSILLECTOMY    . TRANSURETHRAL RESECTION OF PROSTATE  2001   x2    Social History   Socioeconomic History  . Marital status: Married    Spouse name: Not on file  . Number of children: Not on file  . Years of education: Not on file  . Highest education level: Not on file  Occupational History  . Occupation: Chief Financial Officer  Tobacco Use  . Smoking status: Former Smoker    Packs/day: 1.50    Years: 50.00    Pack years: 75.00    Types: Cigarettes    Quit date: 09/17/2008     Years since quitting: 12.0  . Smokeless tobacco: Never Used  Vaping Use  . Vaping Use: Never used  Substance and Sexual Activity  . Alcohol use: Yes    Alcohol/week: 1.0 standard drink    Types: 1 Glasses of wine per week    Comment: each evening  . Drug use: No  . Sexual activity: Not on file  Other Topics Concern  . Not on file  Social History Narrative  . Not on file   Social Determinants of Health   Financial Resource Strain: Low Risk   . Difficulty of Paying Living Expenses: Not very hard  Food Insecurity: Not on file  Transportation Needs: Not on file  Physical Activity: Not on file  Stress: Not on file  Social Connections: Not on file  Intimate Partner Violence: Not on file   Family History  Problem Relation Age of Onset  . Heart disease Mother   . Prostate cancer Father       VITAL SIGNS BP 122/69   Pulse 65   Temp (!) 97 F (36.1 C)   Resp 16   Ht 5\' 6"  (1.676 m)   Wt 126 lb 9.6 oz (57.4 kg)  SpO2 93%   BMI 20.43 kg/m   Outpatient Encounter Medications as of 10/20/2020  Medication Sig  . acetaminophen (TYLENOL) 325 MG tablet Take 650 mg by mouth every 6 (six) hours as needed for mild pain.  Marland Kitchen aspirin EC 81 MG tablet Take 81 mg by mouth. Twice a day from 10/12/2020-11/22/2020 Then take once a day starting 11/23/2020  . Cholecalciferol 1.25 MG (50000 UT) capsule Take 50,000 Units by mouth once a week. On Wednesday  . Fluticasone-Umeclidin-Vilant (TRELEGY ELLIPTA) 100-62.5-25 MCG/INH AEPB Inhale 1 puff into the lungs daily.  Marland Kitchen guaiFENesin (MUCINEX) 600 MG 12 hr tablet Take 600 mg by mouth 2 (two) times daily as needed for cough.  . levothyroxine (SYNTHROID) 75 MCG tablet TAKE 1 TABLET (75 MCG TOTAL) BY MOUTH DAILY.  . mirtazapine (REMERON) 15 MG tablet TAKE 1/2 TABLET BY MOUTH IN THE EVENING  . NON FORMULARY Diet: _____ Regular, ___x___ NAS, _______Consistent Carbohydrate, _______NPO _____Other  . OXYGEN Inhale 4 L into the lungs continuous.  .  pantoprazole (PROTONIX) 40 MG tablet TAKE 1 TABLET BY MOUTH EVERY DAY  . polyvinyl alcohol (LIQUIFILM TEARS) 1.4 % ophthalmic solution Place 1 drop into both eyes 3 (three) times daily as needed for dry eyes.  . predniSONE (DELTASONE) 10 MG tablet Take 1 tablet (10 mg total) by mouth daily with breakfast. HOLD while on Prednisone taper  . tamsulosin (FLOMAX) 0.4 MG CAPS capsule TAKE 1 CAPSULE BY MOUTH EVERY DAY  . zolpidem (AMBIEN) 5 MG tablet TAKE 1 TABLET BY MOUTH EVERY DAY AT BEDTIME AS NEEDED FOR SLEEP  . [DISCONTINUED] calcium gluconate 500 MG tablet Take 500 tablets by mouth daily.  . [DISCONTINUED] HYDROcodone-acetaminophen (NORCO/VICODIN) 5-325 MG tablet Take 1 tablet by mouth every 6 (six) hours as needed for moderate pain.  . [DISCONTINUED] LORazepam (ATIVAN) 0.5 MG tablet Take 1 tablet (0.5 mg total) by mouth 2 (two) times daily as needed.  . [DISCONTINUED] Menthol, Topical Analgesic, (GOLD BOND ORIGINAL STRENGTH EX) Apply 1 application topically as needed (irritation). (Patient not taking: Reported on 10/19/2020)  . [DISCONTINUED] menthol-zinc oxide (GOLD BOND) powder Apply 1 application topically See admin instructions. After showers  And as needed for irritation (Patient not taking: Reported on 10/19/2020)  . [DISCONTINUED] Respiratory Therapy Supplies (FLUTTER) DEVI Use as directed.   No facility-administered encounter medications on file as of 10/20/2020.     SIGNIFICANT DIAGNOSTIC EXAMS  PREVIOUS   09-09-20: echo bubble  1. Left ventricular ejection fraction, by estimation, is 50 to 55%. The left ventricle has low normal function. The left ventricle has no regional wall motion abnormalities. There is moderate left ventricular hypertrophy. Left ventricular diastolic parameters are consistent with Grade I diastolic dysfunction (impaired relaxation   10-09-20: pelvic x-ray: Right femoral neck fracture.   10-09-20: chest x-ray: No active cardiopulmonary disease identified   NO NEW  EXAMS   LABS REVIEWED PREVIOUS   09-09-20: hgb a1c 5.7; chol 270; ldl 140; trig 47; hdl 121 10-09-20: wbc 13.0; hgb 12.9; hct 40.7; mcv 94.7 plt 286; glucose 115; bun 25; creat 0.85; k+ 3.9; na++ 135; ca 9.2 GFR>60 10-12-20; wbc 11.0; hgb 11.9; hct 36.7; mcv 992.9 plt 297 glucose 119; bun 25; creat 0.76; k+ 4.1; na++ 134; ca 8.5 GFR >60  NO NEW LABS.   Review of Systems  Constitutional: Negative for malaise/fatigue.  Respiratory: Negative for cough and shortness of breath.   Cardiovascular: Negative for chest pain, palpitations and leg swelling.  Gastrointestinal: Negative for abdominal pain, constipation and heartburn.  Musculoskeletal: Negative for back pain, joint pain and myalgias.  Skin: Negative.   Neurological: Negative for dizziness.  Psychiatric/Behavioral: The patient is not nervous/anxious.    .   Physical Exam Constitutional:      General: He is not in acute distress.    Appearance: He is well-developed and well-nourished. He is not diaphoretic.  Neck:     Thyroid: No thyromegaly.  Cardiovascular:     Rate and Rhythm: Normal rate and regular rhythm.     Pulses: Normal pulses and intact distal pulses.     Heart sounds: Normal heart sounds.  Pulmonary:     Effort: Pulmonary effort is normal. No respiratory distress.     Breath sounds: Wheezing present.     Comments: 02 few and scattered  Abdominal:     General: Bowel sounds are normal. There is no distension.     Palpations: Abdomen is soft.     Tenderness: There is no abdominal tenderness.  Musculoskeletal:        General: No edema.     Cervical back: Neck supple.     Right lower leg: No edema.     Left lower leg: No edema.     Comments: Is able to move all extremities Right hip arthroplasty 10-10-20 for right femoral neck fracture.    Lymphadenopathy:     Cervical: No cervical adenopathy.  Skin:    General: Skin is warm and dry.     Comments: Right hip incision line without signs of infection present   Neurological:     Mental Status: He is alert and oriented to person, place, and time.  Psychiatric:        Mood and Affect: Mood and affect and mood normal.      ASSESSMENT/ PLAN:  TODAY  1. Closed right hip fracture with routine healing subsequent encounter : is stable will continue therapy as directed will follow up with orthopedics; will continue asa 81 mg twice daily through 11-22-20 then one time daily   2. Aortic atherosclerosis (cxr 02-17-20) will monitor   3. GOLD COPD 3/group d.  Is 02 dependent is stable will continue mucinex twice daily as needed trelegy 100-62.5-25 mcg one puff daily prednisone 10 mg daily   PREVIOUS   4. Chronic respiratory failure with hypoxia: is stable is 02 dependent will monitor   5. GERD without esophagitis: is stable will continue protonix 40 mg daily   6. Hypothyroidism; unspecified type: is stable will continue synthroid 75 mcg daily   7. Moderate protein calorie malnutrition: is stable will continue remeron 7.5 mg nightly for appetite   8. Benign prostatic hyperplasia without lower urinary tract symptoms / history of prostate cancer is stable will continue flomax 0.4 mg daily   9. Acute blood loss anemia as cause of postoperative anemia: is stable hgb 11.9 is mild will monitor  10. Vitamin D deficiency: d is 28.37 will begin vit D 50,000 units weekly         MD is aware of resident's narcotic use and is in agreement with current plan of care. We will attempt to wean resident as appropriate.  Ok Edwards NP [facility]  Contact [phone] Monday through Friday 8am- 5pm  After hours call [phone]

## 2020-10-21 ENCOUNTER — Observation Stay (HOSPITAL_COMMUNITY): Payer: PPO

## 2020-10-21 ENCOUNTER — Encounter (HOSPITAL_COMMUNITY): Payer: Self-pay | Admitting: Family Medicine

## 2020-10-21 ENCOUNTER — Telehealth: Payer: Self-pay | Admitting: Pharmacist

## 2020-10-21 ENCOUNTER — Other Ambulatory Visit: Payer: Self-pay

## 2020-10-21 DIAGNOSIS — J9611 Chronic respiratory failure with hypoxia: Secondary | ICD-10-CM | POA: Diagnosis not present

## 2020-10-21 DIAGNOSIS — J449 Chronic obstructive pulmonary disease, unspecified: Secondary | ICD-10-CM | POA: Diagnosis not present

## 2020-10-21 DIAGNOSIS — R55 Syncope and collapse: Secondary | ICD-10-CM | POA: Diagnosis not present

## 2020-10-21 DIAGNOSIS — E039 Hypothyroidism, unspecified: Secondary | ICD-10-CM | POA: Diagnosis not present

## 2020-10-21 DIAGNOSIS — I6523 Occlusion and stenosis of bilateral carotid arteries: Secondary | ICD-10-CM | POA: Diagnosis not present

## 2020-10-21 LAB — COMPREHENSIVE METABOLIC PANEL
ALT: 52 U/L — ABNORMAL HIGH (ref 0–44)
AST: 32 U/L (ref 15–41)
Albumin: 3.4 g/dL — ABNORMAL LOW (ref 3.5–5.0)
Alkaline Phosphatase: 74 U/L (ref 38–126)
Anion gap: 9 (ref 5–15)
BUN: 16 mg/dL (ref 8–23)
CO2: 32 mmol/L (ref 22–32)
Calcium: 9.5 mg/dL (ref 8.9–10.3)
Chloride: 96 mmol/L — ABNORMAL LOW (ref 98–111)
Creatinine, Ser: 0.78 mg/dL (ref 0.61–1.24)
GFR, Estimated: >60 mL/min (ref >60)
Glucose, Bld: 94 mg/dL (ref 70–99)
Potassium: 4 mmol/L (ref 3.5–5.1)
Sodium: 137 mmol/L (ref 135–145)
Total Bilirubin: 0.5 mg/dL (ref 0.3–1.2)
Total Protein: 6.5 g/dL (ref 6.5–8.1)

## 2020-10-21 LAB — CBC
HCT: 37.7 % — ABNORMAL LOW (ref 39.0–52.0)
Hemoglobin: 11.7 g/dL — ABNORMAL LOW (ref 13.0–17.0)
MCH: 29.9 pg (ref 26.0–34.0)
MCHC: 31 g/dL (ref 30.0–36.0)
MCV: 96.4 fL (ref 80.0–100.0)
Platelets: 517 10*3/uL — ABNORMAL HIGH (ref 150–400)
RBC: 3.91 MIL/uL — ABNORMAL LOW (ref 4.22–5.81)
RDW: 14.3 % (ref 11.5–15.5)
WBC: 14.3 10*3/uL — ABNORMAL HIGH (ref 4.0–10.5)
nRBC: 0 % (ref 0.0–0.2)

## 2020-10-21 LAB — SARS CORONAVIRUS 2 (TAT 6-24 HRS): SARS Coronavirus 2: NEGATIVE

## 2020-10-21 LAB — TSH: TSH: 3.236 u[IU]/mL (ref 0.350–4.500)

## 2020-10-21 IMAGING — US US CAROTID DUPLEX BILAT
1 series · 13 of 24 positions shown · non-contrast
Comparison: MRA neck [DATE]

CLINICAL DATA: Syncope with collapse

EXAM:
BILATERAL CAROTID DUPLEX ULTRASOUND
TECHNIQUE: Gray scale imaging, color Doppler and duplex ultrasound were
performed of bilateral carotid and vertebral arteries in the neck.

[Series 1: us carotid bilateral · 13 of 68 slices shown]
[im 1/68]
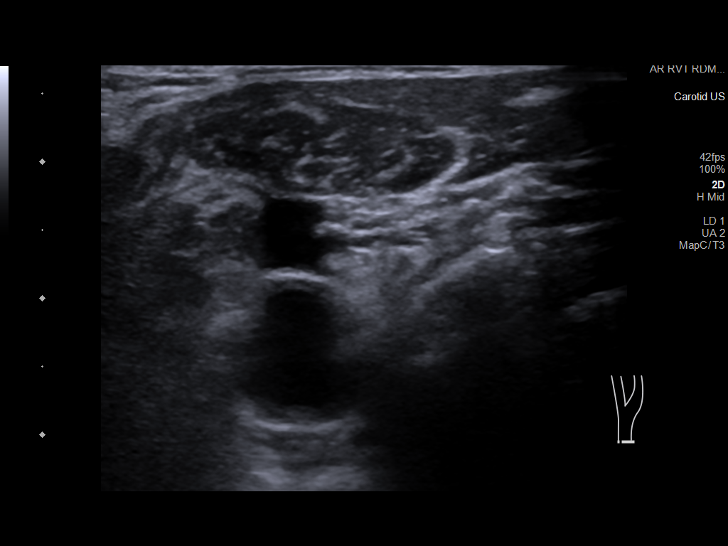
[im 6/68]
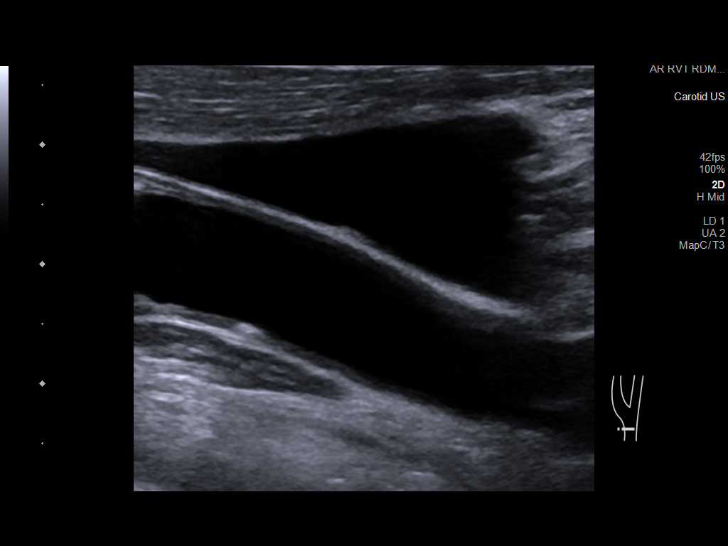
[im 12/68]
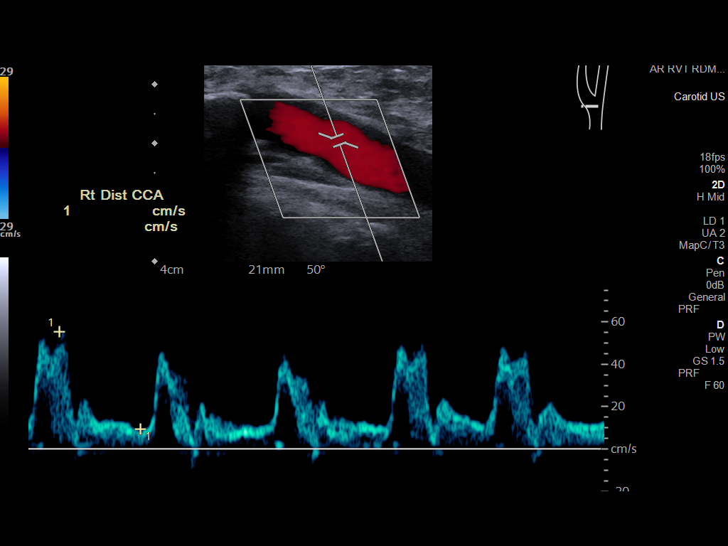
[im 18/68]
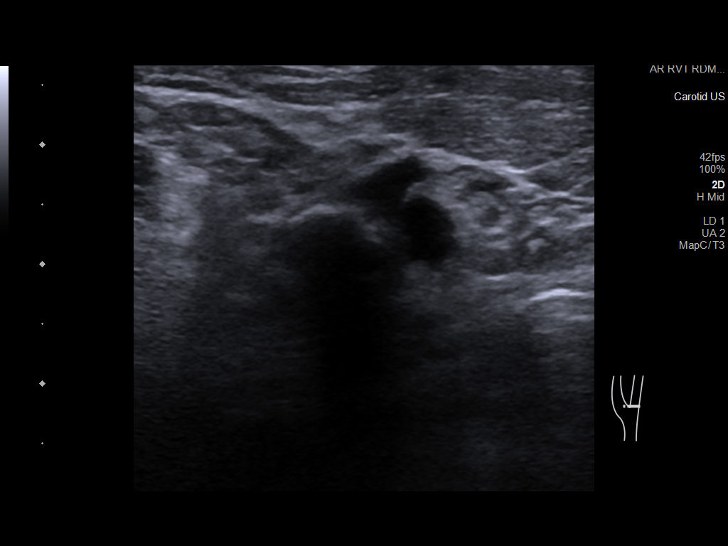
[im 24/68]
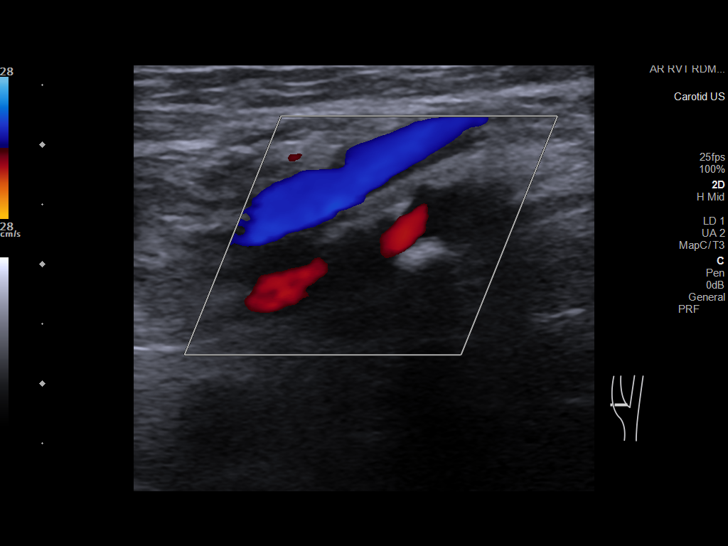
[im 30/68]
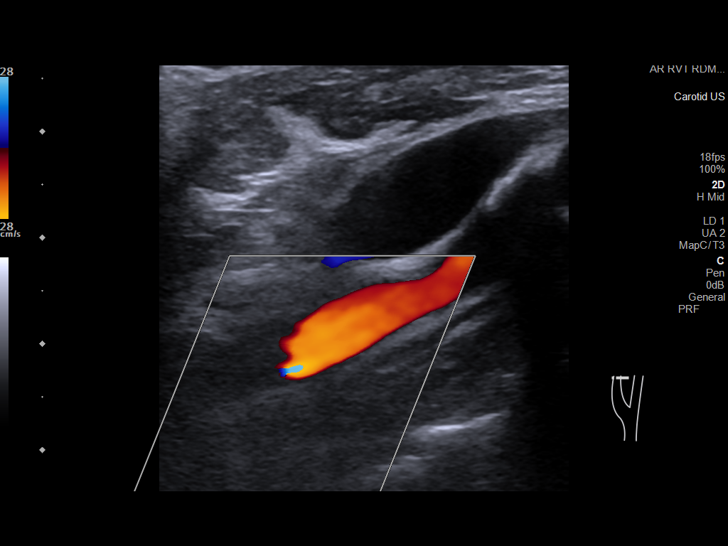
[im 35/68]
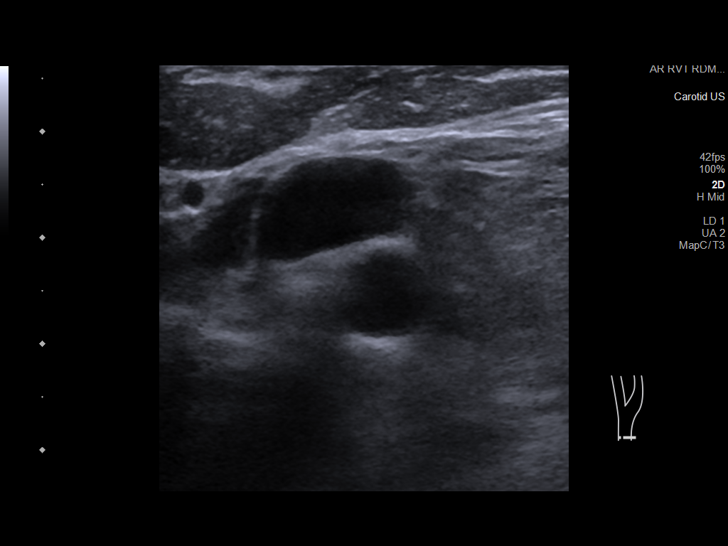
[im 38/68]
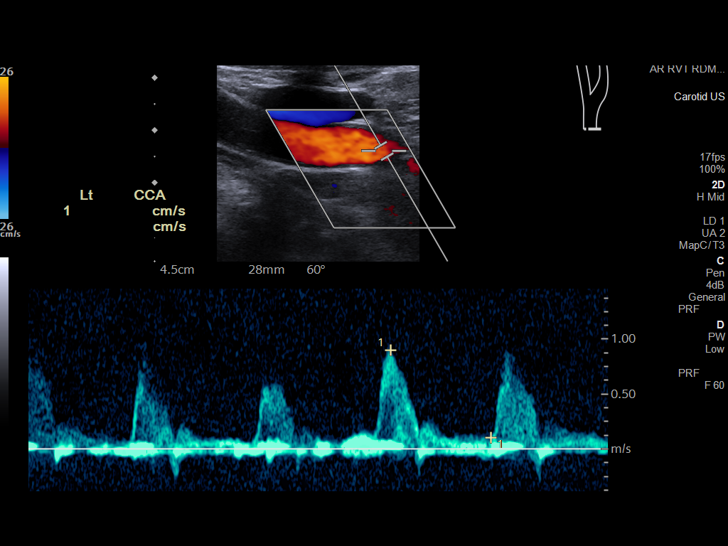
[im 44/68]
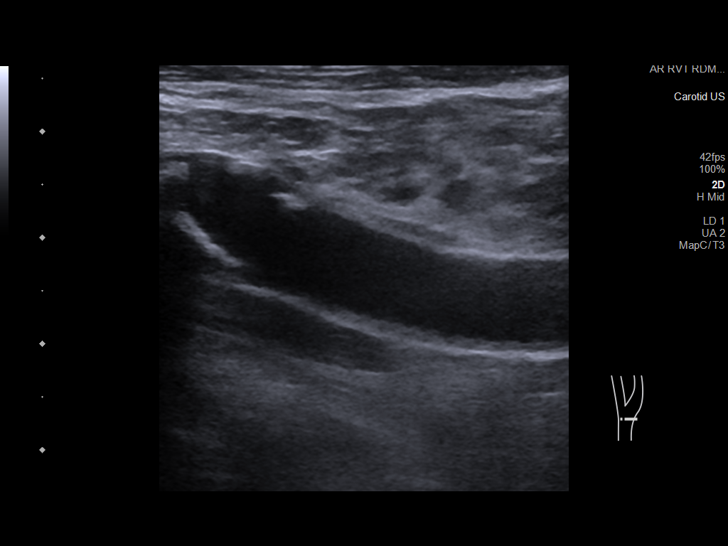
[im 50/68]
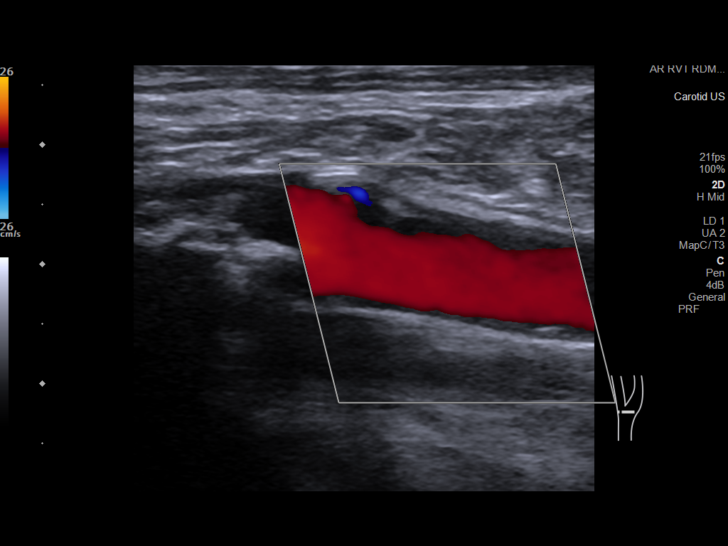
[im 56/68]
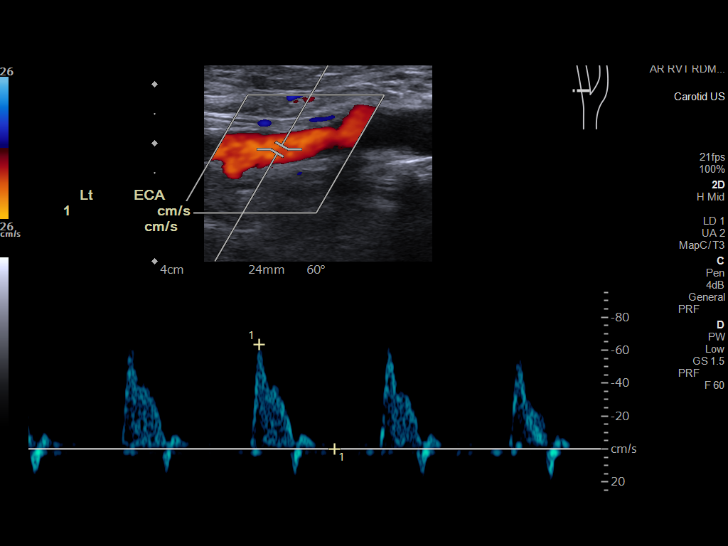
[im 62/68]
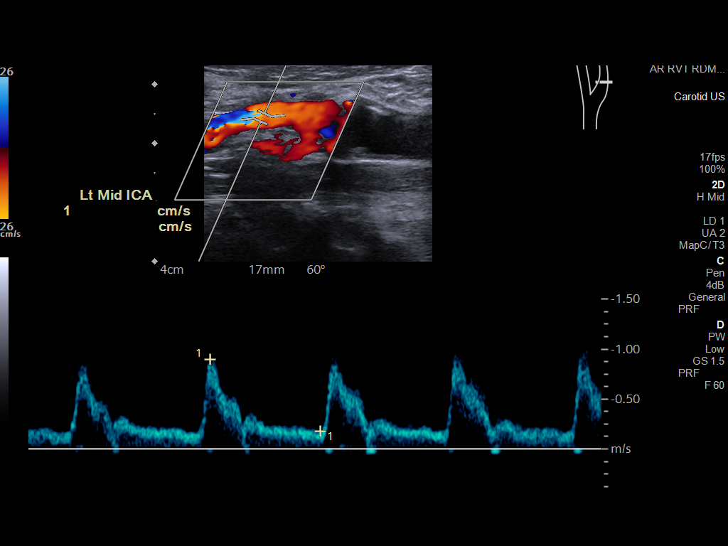
[im 68/68]
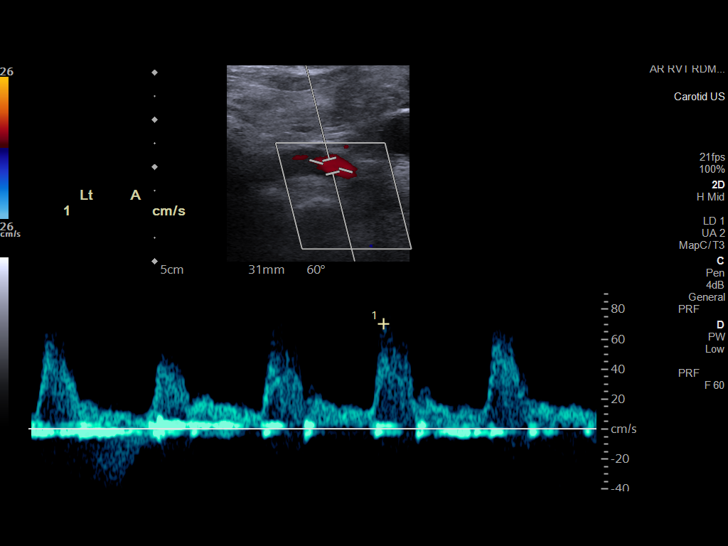

[13 of 24 positions shown; findings below may reference images not displayed]

FINDINGS: Criteria: Quantification of carotid stenosis is based on velocity
parameters that correlate the residual internal carotid diameter
with NASCET-based stenosis levels, using the diameter of the distal
internal carotid lumen as the denominator for stenosis measurement.

The following velocity measurements were obtained:

RIGHT

ICA: 69/15 cm/sec

CCA: 52/13 cm/sec

SYSTOLIC ICA/CCA RATIO:

ECA: 74 cm/sec

LEFT

ICA: 108/16 cm/sec

CCA: 73/5 cm/sec

SYSTOLIC ICA/CCA RATIO:

ECA: 63 cm/sec

RIGHT CAROTID ARTERY: Heterogeneous calcified plaque of the internal
carotid artery origin and distal common carotid artery without
hemodynamically significant stenosis.

RIGHT VERTEBRAL ARTERY:  Antegrade flow.

LEFT CAROTID ARTERY: Heterogeneous plaque of the distal common
carotid artery extending into the left internal carotid artery
origin without flow limiting stenosis.

LEFT VERTEBRAL ARTERY:  Antegrade flow.
IMPRESSION: No hemodynamically significant stenosis of the internal carotid
arteries.

## 2020-10-21 MED ORDER — ZOLPIDEM TARTRATE 5 MG PO TABS
5.0000 mg | ORAL_TABLET | Freq: Every evening | ORAL | Status: DC | PRN
  Administered 2020-10-21: 5 mg via ORAL
  Filled 2020-10-21: qty 1

## 2020-10-21 MED ORDER — PANTOPRAZOLE SODIUM 40 MG PO TBEC
40.0000 mg | DELAYED_RELEASE_TABLET | Freq: Every day | ORAL | Status: DC
  Administered 2020-10-21 – 2020-10-22 (×2): 40 mg via ORAL
  Filled 2020-10-21 (×2): qty 1

## 2020-10-21 MED ORDER — ACETAMINOPHEN 325 MG PO TABS: 650.0000 mg | ORAL_TABLET | Freq: Four times a day (QID) | ORAL | Status: DC | PRN

## 2020-10-21 MED ORDER — MIRTAZAPINE 15 MG PO TABS
15.0000 mg | ORAL_TABLET | Freq: Every day | ORAL | Status: DC
Start: 2020-10-21 — End: 2020-10-21
  Administered 2020-10-21: 15 mg via ORAL
  Filled 2020-10-21: qty 1

## 2020-10-21 MED ORDER — LORAZEPAM 0.5 MG PO TABS
0.5000 mg | ORAL_TABLET | Freq: Two times a day (BID) | ORAL | Status: DC | PRN
  Administered 2020-10-21: 0.5 mg via ORAL
  Filled 2020-10-21: qty 1

## 2020-10-21 MED ORDER — MIRTAZAPINE 15 MG PO TABS
15.0000 mg | ORAL_TABLET | Freq: Every day | ORAL | Status: DC
  Administered 2020-10-21: 15 mg via ORAL
  Filled 2020-10-21: qty 1

## 2020-10-21 MED ORDER — HEPARIN SODIUM (PORCINE) 5000 UNIT/ML IJ SOLN: 5000.0000 [IU] | Freq: Three times a day (TID) | INTRAMUSCULAR | Status: DC

## 2020-10-21 MED ORDER — GUAIFENESIN ER 600 MG PO TB12: 600.0000 mg | ORAL_TABLET | Freq: Two times a day (BID) | ORAL | Status: DC | PRN

## 2020-10-21 MED ORDER — SODIUM CHLORIDE 0.9% FLUSH
3.0000 mL | Freq: Two times a day (BID) | INTRAVENOUS | Status: DC
  Administered 2020-10-21 – 2020-10-22 (×3): 3 mL via INTRAVENOUS

## 2020-10-21 MED ORDER — HYDROCODONE-ACETAMINOPHEN 5-325 MG PO TABS: 1.0000 | ORAL_TABLET | Freq: Four times a day (QID) | ORAL | Status: DC | PRN

## 2020-10-21 MED ORDER — ONDANSETRON HCL 4 MG/2ML IJ SOLN: 4.0000 mg | Freq: Four times a day (QID) | INTRAMUSCULAR | Status: DC | PRN

## 2020-10-21 MED ORDER — ENSURE ENLIVE PO LIQD
237.0000 mL | Freq: Two times a day (BID) | ORAL | Status: DC
  Administered 2020-10-21 – 2020-10-22 (×3): 237 mL via ORAL
  Filled 2020-10-21 (×2): qty 237

## 2020-10-21 MED ORDER — ONDANSETRON HCL 4 MG PO TABS: 4.0000 mg | ORAL_TABLET | Freq: Four times a day (QID) | ORAL | Status: DC | PRN

## 2020-10-21 MED ORDER — ACETAMINOPHEN 650 MG RE SUPP: 650.0000 mg | Freq: Four times a day (QID) | RECTAL | Status: DC | PRN

## 2020-10-21 MED ORDER — HEPARIN SODIUM (PORCINE) 5000 UNIT/ML IJ SOLN
5000.0000 [IU] | Freq: Three times a day (TID) | INTRAMUSCULAR | Status: DC
  Administered 2020-10-21 – 2020-10-22 (×4): 5000 [IU] via SUBCUTANEOUS
  Filled 2020-10-21 (×4): qty 1

## 2020-10-21 MED ORDER — FLUTICASONE FUROATE-VILANTEROL 100-25 MCG/INH IN AEPB
1.0000 | INHALATION_SPRAY | Freq: Every day | RESPIRATORY_TRACT | Status: DC
  Administered 2020-10-21 – 2020-10-22 (×2): 1 via RESPIRATORY_TRACT
  Filled 2020-10-21 (×2): qty 28

## 2020-10-21 MED ORDER — LEVOTHYROXINE SODIUM 75 MCG PO TABS
75.0000 ug | ORAL_TABLET | Freq: Every day | ORAL | Status: DC
  Administered 2020-10-21 – 2020-10-22 (×2): 75 ug via ORAL
  Filled 2020-10-21 (×2): qty 1

## 2020-10-21 MED ORDER — LACTATED RINGERS IV SOLN: INTRAVENOUS | Status: DC

## 2020-10-21 MED ORDER — FLUTICASONE-UMECLIDIN-VILANT 100-62.5-25 MCG/INH IN AEPB: 1.0000 | INHALATION_SPRAY | Freq: Every day | RESPIRATORY_TRACT | Status: DC

## 2020-10-21 MED ORDER — TAMSULOSIN HCL 0.4 MG PO CAPS
0.4000 mg | ORAL_CAPSULE | Freq: Every day | ORAL | Status: DC
  Administered 2020-10-21 – 2020-10-22 (×2): 0.4 mg via ORAL
  Filled 2020-10-21 (×2): qty 1

## 2020-10-21 MED ORDER — ASPIRIN EC 81 MG PO TBEC
81.0000 mg | DELAYED_RELEASE_TABLET | Freq: Two times a day (BID) | ORAL | Status: DC
  Administered 2020-10-21 – 2020-10-22 (×3): 81 mg via ORAL
  Filled 2020-10-21 (×3): qty 1

## 2020-10-21 MED ORDER — LORAZEPAM 0.5 MG PO TABS
0.5000 mg | ORAL_TABLET | Freq: Every day | ORAL | Status: DC
  Administered 2020-10-21: 0.5 mg via ORAL
  Filled 2020-10-21: qty 1

## 2020-10-21 MED ORDER — UMECLIDINIUM BROMIDE 62.5 MCG/INH IN AEPB
1.0000 | INHALATION_SPRAY | Freq: Every day | RESPIRATORY_TRACT | Status: DC
  Administered 2020-10-21 – 2020-10-22 (×2): 1 via RESPIRATORY_TRACT
  Filled 2020-10-21 (×2): qty 7

## 2020-10-21 MED ORDER — POLYETHYLENE GLYCOL 3350 17 G PO PACK: 17.0000 g | PACK | Freq: Every day | ORAL | Status: DC | PRN

## 2020-10-21 NOTE — Care Management Obs Status (Signed)
[facility] NOTIFICATION   Patient Details  Name: John Black [name]: [id number] Date of Birth: 03-15-1929   Medicare Observation Status Notification Given:  Yes (spoke with by telephone, letter mailed to [address] [place], [place] [address])    Tommy Medal 10/21/2020, 4:09 PM

## 2020-10-21 NOTE — Progress Notes (Signed)
PROGRESS NOTE    John Black  [id number] DOB: 03/31/1929 DOA: 10/20/2020 PCP: Alycia Rossetti, MD    Brief Narrative:  87 year old male who was recently in the hospital for hip fracture after mechanical fall, was discharged to skilled nursing facility.  He was brought back to the emergency room after possible syncopal event and fall.  He denies any chest pain.  Work-up in the emergency room was negative including CT head, CT C-spine, CT angiogram of chest.  Carotid Dopplers were also unrevealing.  Currently echocardiogram is pending.  He is mildly orthostatic.  Suspect that he may have some degree of dehydration and is receiving IV fluids.  If patient remains stable, anticipate discharge back to skilled nursing facility in the next 24 hours.   Assessment & Plan:   Principal Problem:   Syncope and collapse Active Problems:   COPD GOLD 3/ group D  02 dep   Hypothyroidism   Chronic respiratory failure with hypoxia (HCC)   Mild protein-calorie malnutrition (HCC)   Syncope and collapse -CT head and neck is unremarkable -No evidence of infection-carotid Dopplers unremarkable -Echocardiogram has been ordered and is pending -CT angiogram of the chest without any evidence of PE -Orthostatic vital signs do show elevated heart rate on standing -Suspect that he may have some degree of dehydration versus contributing to his symptoms -Continue gentle hydration overnight  Mild protein calorie malnutrition -Continue feeding supplement between meals  Anemia -Likely related to recent surgery -Patient is also FOBT positive, but he has brown stools and no gross evidence of bleeding -We will hold off on GI evaluation unless patient develops gross bleeding or significant decline in hemoglobin, since any procedures would be high risk considering respiratory status  COPD -Oxygen dependent -Respiratory status appears to be at baseline -Continue on Trelegy  Hypothyroidism -Continue on  Synthroid  GERD -Continue on PPI    DVT prophylaxis: heparin injection 5,000 Units Start: 10/21/20 0600 SCDs Start: 10/21/20 0142  Code Status: full code Family Communication: discussed with daughter and wife Disposition Plan: Status is: Observation  The patient remains OBS appropriate and will d/c before 2 midnights.  Dispo: The patient is from: SNF              Anticipated d/c is to: SNF              Anticipated d/c date is: 1 day              Patient currently is not medically stable to d/c.   Difficult to place patient No   Consultants:     Procedures:     Antimicrobials:       Subjective: He denies any dizziness at this time.  Denies any chest pain.  He does not recall the events around his fall.  He was noted to become tachycardic on standing earlier.  Objective: Vitals:   10/21/20 0750 10/21/20 1418 10/21/20 1800 10/21/20 1859  BP:  (!) 98/59 140/80   Pulse:  (!) 103 (!) 102   Resp:   18   Temp:  99.1 F (37.3 C) 99 F (37.2 C)   TempSrc:  Oral Oral   SpO2: 96% 98% 96% 96%  Weight:      Height:        Intake/Output Summary (Last 24 hours) at 10/21/2020 1909 Last data filed at 10/21/2020 1018 Gross per 24 hour  Intake 243 ml  Output 1000 ml  Net -757 ml   [employer]   10/20/20  1605 10/21/20 0628  Weight: 58 kg 57 kg    Examination:  General exam: Appears calm and comfortable  Respiratory system: Clear to auscultation. Respiratory effort normal. Cardiovascular system: S1 & S2 heard, RRR. No JVD, murmurs, rubs, gallops or clicks. No pedal edema. Gastrointestinal system: Abdomen is nondistended, soft and nontender. No organomegaly or masses felt. Normal bowel sounds heard. Central nervous system: Alert and oriented. No focal neurological deficits. Extremities: Symmetric 5 x 5 power. Skin: No rashes, lesions or ulcers Psychiatry: Judgement and insight appear normal. Mood & affect appropriate.     Data Reviewed: I have personally reviewed  following labs and imaging studies  CBC: Recent Labs  Lab 10/20/20 1700 10/21/20 0504  WBC 11.1* 14.3*  NEUTROABS 8.9*  --   HGB 11.0* 11.7*  HCT 35.6* 37.7*  MCV 96.7 96.4  PLT 444* 001*   Basic Metabolic Panel: Recent Labs  Lab 10/20/20 1700 10/21/20 0504  NA 133* 137  K 5.3* 4.0  CL 97* 96*  CO2 29 32  GLUCOSE 137* 94  BUN 22 16  CREATININE 1.06 0.78  CALCIUM 8.9 9.5   GFR: Estimated Creatinine Clearance: 50.5 mL/min (by C-G formula based on SCr of 0.78 mg/dL). Liver Function Tests: Recent Labs  Lab 10/20/20 1700 10/21/20 0504  AST 36 32  ALT 51* 52*  ALKPHOS 69 74  BILITOT 0.5 0.5  PROT 6.3* 6.5  ALBUMIN 3.2* 3.4*   No results for input(s): LIPASE, AMYLASE in the last 168 hours. No results for input(s): AMMONIA in the last 168 hours. Coagulation Profile: No results for input(s): INR, PROTIME in the last 168 hours. Cardiac Enzymes: No results for input(s): CKTOTAL, CKMB, CKMBINDEX, TROPONINI in the last 168 hours. BNP (last 3 results) No results for input(s): PROBNP in the last 8760 hours. HbA1C: No results for input(s): HGBA1C in the last 72 hours. CBG: Recent Labs  Lab 10/20/20 1843  GLUCAP 108*   Lipid Profile: No results for input(s): CHOL, HDL, LDLCALC, TRIG, CHOLHDL, LDLDIRECT in the last 72 hours. Thyroid Function Tests: Recent Labs    10/21/20 0504  TSH 3.236   Anemia Panel: No results for input(s): VITAMINB12, FOLATE, FERRITIN, TIBC, IRON, RETICCTPCT in the last 72 hours. Sepsis Labs: No results for input(s): PROCALCITON, LATICACIDVEN in the last 168 hours.  Recent Results (from the past 240 hour(s))  SARS CORONAVIRUS 2 (TAT 6-24 HRS) Nasopharyngeal Nasopharyngeal Swab     Status: None   Collection Time: 10/20/20 11:00 PM   Specimen: Nasopharyngeal Swab  Result Value Ref Range Status   SARS Coronavirus 2 NEGATIVE NEGATIVE Final    Comment: (NOTE) SARS-CoV-2 target nucleic acids are NOT DETECTED.  The SARS-CoV-2 RNA is  generally detectable in upper and lower respiratory specimens during the acute phase of infection. Negative results do not preclude SARS-CoV-2 infection, do not rule out co-infections with other pathogens, and should not be used as the sole basis for treatment or other patient management decisions. Negative results must be combined with clinical observations, patient history, and epidemiological information. The expected result is Negative.  Fact Sheet for Patients: SugarRoll.be  Fact Sheet for Healthcare Providers: https://www.woods-mathews.com/  This test is not yet approved or cleared by the [place] FDA and  has been authorized for detection and/or diagnosis of SARS-CoV-2 by FDA under an Emergency Use Authorization (EUA). This EUA will remain  in effect (meaning this test can be used) for the duration of the COVID-19 declaration under Se ction 564(b)(1) of the Act, 21 U.S.C.  section 360bbb-3(b)(1), unless the authorization is terminated or revoked sooner.  Performed at [facility], [place] [address], [place], [place] [address]          Radiology Studies: CT HEAD WO CONTRAST  Result Date: 10/20/2020 CLINICAL DATA:  Facial trauma. Additional provided: Fall, swelling above left eye, no loss of consciousness, alert and oriented. EXAM: CT HEAD WITHOUT CONTRAST CT CERVICAL SPINE WITHOUT CONTRAST TECHNIQUE: Multidetector CT imaging of the head and cervical spine was performed following the standard protocol without intravenous contrast. Multiplanar CT image reconstructions of the cervical spine were also generated. COMPARISON:  Brain MRI/MRA 09/09/2020.  Head CT 09/08/2020. FINDINGS: CT HEAD FINDINGS Brain: Mildly motion degraded exam. Mild-to-moderate cerebral atrophy. Redemonstrated small chronic cortical infarcts within the bilateral frontal lobes, left parietal lobe and right occipital lobe. Background advanced ill-defined  hypoattenuation within the cerebral white matter is nonspecific, but compatible with chronic small vessel ischemic disease. Known small chronic infarcts within the bilateral cerebellar hemispheres better appreciated on the prior MRI of 09/09/2020. There is no acute intracranial hemorrhage. No acute demarcated cortical infarct. No extra-axial fluid collection. No evidence of intracranial mass. No midline shift. Vascular: No hyperdense vessel.  Atherosclerotic calcifications. Skull: Normal. Negative for fracture or focal lesion. Sinuses/Orbits: Visualized orbits show no acute finding. Mild frontal, ethmoid and maxillary sinus mucosal thickening at the imaged levels. Small right maxillary sinus mucous retention cyst. CT CERVICAL SPINE FINDINGS Mildly motion degraded exam. Alignment: Nonspecific reversal of the expected cervical lordosis. Trace C2-C3 grade 1 anterolisthesis. Trace C5-C6 grade 1 retrolisthesis. Skull base and vertebrae: The basion-dental and atlanto-dental intervals are maintained.No evidence of acute fracture to the cervical spine. Soft tissues and spinal canal: No prevertebral fluid or swelling. No visible canal hematoma. Disc levels: Cervical spondylosis with multilevel disc space narrowing, disc bulges, posterior disc osteophytes, uncovertebral hypertrophy and facet arthrosis. At C4-C5, there is vertebral fusion and right facet joint ankylosis. Disc space narrowing is severe at C3-C4, C5-C6, C6-C7 and C7-T1. Small caudally migrated central disc extrusion at C2-C3 contributing to apparent moderate spinal canal stenosis. Multilevel up to moderate spinal canal stenosis at the remaining levels. Multilevel bony neural foraminal narrowing. Bony neural foraminal narrowing. Additionally, there is prominent ligamentous hypertrophy/pannus formation posterior to the dens contributing to mild C1-C2 canal narrowing. Upper chest: Biapical bullous emphysema. IMPRESSION: CT head: 1. Mildly motion degraded exam. 2.  No evidence of acute intracranial abnormality. 3. Redemonstrated small chronic cortical infarcts within the bilateral frontal lobes, left parietal and right occipital lobes. 4. Stable background mild-to-moderate cerebral atrophy and severe chronic small vessel ischemic disease. 5. Known small chronic infarcts within the bilateral cerebellar hemispheres. 6. Mild paranasal disease, as described. CT cervical spine: 1. Mildly motion degraded exam. 2. No evidence of acute fracture to the cervical spine. 3. Nonspecific reversal of the expected cervical lordosis. 4. Minimal C2-C3 grade 1 anterolisthesis and C5-C6 grade 1 retrolisthesis. 5. Cervical spondylosis as described with up to moderate appreciable spinal canal stenosis and multilevel bony neural foraminal narrowing. Degenerative fusion at C4-C5. 6. Biapical bullous emphysema (emphysema (ICD10-J43.9)). Electronically Signed   By: Kellie Simmering DO   On: 10/20/2020 18:33   CT Angio Chest PE W and/or Wo Contrast  Result Date: 10/20/2020 CLINICAL DATA:  87 year old male with syncope. EXAM: CT ANGIOGRAPHY CHEST WITH CONTRAST TECHNIQUE: Multidetector CT imaging of the chest was performed using the standard protocol during bolus administration of intravenous contrast. Multiplanar CT image reconstructions and MIPs were obtained to evaluate the vascular anatomy. CONTRAST:  155mL OMNIPAQUE IOHEXOL 350 MG/ML SOLN COMPARISON:  Chest CT dated 02/17/2020. FINDINGS: Cardiovascular: There is no cardiomegaly or pericardial effusion. Three-vessel coronary vascular calcification. Advanced atherosclerotic calcification of the thoracic aorta. No aneurysmal dilatation or dissection. No pulmonary artery embolus identified. Mediastinum/Nodes: No hilar or mediastinal adenopathy. The esophagus is grossly unremarkable. No mediastinal fluid collection. Lungs/Pleura: Severe emphysema. Right upper lobe scarring and architectural distortion. There is a 6 mm left lower lobe nodule (77/6). No  consolidative changes. There is no pleural effusion pneumothorax. Bilateral calcified pleural plaques. The central airways are patent. Upper Abdomen: No acute abnormality. Musculoskeletal: Osteopenia with degenerative changes of the spine. Old appearing bilateral rib fractures. No acute osseous pathology. Review of the MIP images confirms the above findings. IMPRESSION: 1. No acute intrathoracic pathology. No CT evidence of pulmonary artery embolus. 2. Severe emphysema with right upper lobe scarring and architectural distortion. 3. A 6 mm left lower lobe nodule. Non-contrast chest CT at 6-12 months is recommended. If the nodule is stable at time of repeat CT, then future CT at 18-24 months (from today's scan) is considered optional for low-risk patients, but is recommended for high-risk patients. This recommendation follows the consensus statement: Guidelines for Management of Incidental Pulmonary Nodules Detected on CT Images: From the Fleischner Society 2017; Radiology 2017; 284:228-243. 4. Aortic Atherosclerosis (ICD10-I70.0) and Emphysema (ICD10-J43.9). Electronically Signed   By: Anner Crete M.D.   On: 10/20/2020 21:37   CT CERVICAL SPINE WO CONTRAST  Result Date: 10/20/2020 CLINICAL DATA:  Facial trauma. Additional provided: Fall, swelling above left eye, no loss of consciousness, alert and oriented. EXAM: CT HEAD WITHOUT CONTRAST CT CERVICAL SPINE WITHOUT CONTRAST TECHNIQUE: Multidetector CT imaging of the head and cervical spine was performed following the standard protocol without intravenous contrast. Multiplanar CT image reconstructions of the cervical spine were also generated. COMPARISON:  Brain MRI/MRA 09/09/2020.  Head CT 09/08/2020. FINDINGS: CT HEAD FINDINGS Brain: Mildly motion degraded exam. Mild-to-moderate cerebral atrophy. Redemonstrated small chronic cortical infarcts within the bilateral frontal lobes, left parietal lobe and right occipital lobe. Background advanced ill-defined  hypoattenuation within the cerebral white matter is nonspecific, but compatible with chronic small vessel ischemic disease. Known small chronic infarcts within the bilateral cerebellar hemispheres better appreciated on the prior MRI of 09/09/2020. There is no acute intracranial hemorrhage. No acute demarcated cortical infarct. No extra-axial fluid collection. No evidence of intracranial mass. No midline shift. Vascular: No hyperdense vessel.  Atherosclerotic calcifications. Skull: Normal. Negative for fracture or focal lesion. Sinuses/Orbits: Visualized orbits show no acute finding. Mild frontal, ethmoid and maxillary sinus mucosal thickening at the imaged levels. Small right maxillary sinus mucous retention cyst. CT CERVICAL SPINE FINDINGS Mildly motion degraded exam. Alignment: Nonspecific reversal of the expected cervical lordosis. Trace C2-C3 grade 1 anterolisthesis. Trace C5-C6 grade 1 retrolisthesis. Skull base and vertebrae: The basion-dental and atlanto-dental intervals are maintained.No evidence of acute fracture to the cervical spine. Soft tissues and spinal canal: No prevertebral fluid or swelling. No visible canal hematoma. Disc levels: Cervical spondylosis with multilevel disc space narrowing, disc bulges, posterior disc osteophytes, uncovertebral hypertrophy and facet arthrosis. At C4-C5, there is vertebral fusion and right facet joint ankylosis. Disc space narrowing is severe at C3-C4, C5-C6, C6-C7 and C7-T1. Small caudally migrated central disc extrusion at C2-C3 contributing to apparent moderate spinal canal stenosis. Multilevel up to moderate spinal canal stenosis at the remaining levels. Multilevel bony neural foraminal narrowing. Bony neural foraminal narrowing. Additionally, there is prominent ligamentous hypertrophy/pannus formation posterior to the dens contributing  to mild C1-C2 canal narrowing. Upper chest: Biapical bullous emphysema. IMPRESSION: CT head: 1. Mildly motion degraded exam. 2.  No evidence of acute intracranial abnormality. 3. Redemonstrated small chronic cortical infarcts within the bilateral frontal lobes, left parietal and right occipital lobes. 4. Stable background mild-to-moderate cerebral atrophy and severe chronic small vessel ischemic disease. 5. Known small chronic infarcts within the bilateral cerebellar hemispheres. 6. Mild paranasal disease, as described. CT cervical spine: 1. Mildly motion degraded exam. 2. No evidence of acute fracture to the cervical spine. 3. Nonspecific reversal of the expected cervical lordosis. 4. Minimal C2-C3 grade 1 anterolisthesis and C5-C6 grade 1 retrolisthesis. 5. Cervical spondylosis as described with up to moderate appreciable spinal canal stenosis and multilevel bony neural foraminal narrowing. Degenerative fusion at C4-C5. 6. Biapical bullous emphysema (emphysema (ICD10-J43.9)). Electronically Signed   By: Kellie Simmering DO   On: 10/20/2020 18:33   US Carotid Bilateral  Result Date: 10/21/2020 CLINICAL DATA:  Syncope with collapse EXAM: BILATERAL CAROTID DUPLEX ULTRASOUND TECHNIQUE: Pearline Cables scale imaging, color Doppler and duplex ultrasound were performed of bilateral carotid and vertebral arteries in the neck. COMPARISON:  MRA neck 09/09/2020 FINDINGS: Criteria: Quantification of carotid stenosis is based on velocity parameters that correlate the residual internal carotid diameter with NASCET-based stenosis levels, using the diameter of the distal internal carotid lumen as the denominator for stenosis measurement. The following velocity measurements were obtained: RIGHT ICA: 69/15 cm/sec CCA: [id number] cm/sec SYSTOLIC ICA/CCA RATIO:  1.3 ECA: 74 cm/sec LEFT ICA: 108/16 cm/sec CCA: [id number] cm/sec SYSTOLIC ICA/CCA RATIO:  1.5 ECA: 63 cm/sec RIGHT CAROTID ARTERY: Heterogeneous calcified plaque of the internal carotid artery origin and distal common carotid artery without hemodynamically significant stenosis. RIGHT VERTEBRAL ARTERY:  Antegrade flow. LEFT  CAROTID ARTERY: Heterogeneous plaque of the distal common carotid artery extending into the left internal carotid artery origin without flow limiting stenosis. LEFT VERTEBRAL ARTERY:  Antegrade flow. IMPRESSION: No hemodynamically significant stenosis of the internal carotid arteries. Electronically Signed   By: Miachel Roux M.D.   On: 10/21/2020 14:13        Scheduled Meds: . aspirin EC  81 mg Oral BID  . feeding supplement  237 mL Oral BID BM  . fluticasone furoate-vilanterol  1 puff Inhalation Daily   And  . umeclidinium bromide  1 puff Inhalation Daily  . heparin  5,000 Units Subcutaneous Q8H  . levothyroxine  75 mcg Oral QAC breakfast  . lidocaine-EPINEPHrine  20 mL Intradermal Once  . LORazepam  0.5 mg Oral QHS  . mirtazapine  15 mg Oral QHS  . pantoprazole  40 mg Oral Daily  . sodium chloride flush  3 mL Intravenous Q12H  . tamsulosin  0.4 mg Oral Daily   Continuous Infusions: . lactated ringers       LOS: 0 days    Time spent: 107mins    Kathie Dike, MD [facility]   If 7PM-7AM, please contact night-coverage [email or web address]  10/21/2020, 7:09 PM

## 2020-10-21 NOTE — TOC Initial Note (Signed)
Transition of Care [facility]) - Initial/Assessment Note   Patient Details  Name: John Black [name]: [id number] Date of Birth: [birth year]  Transition of [facility]) CM/SW Contact:    Sherie Don, LCSW Phone Number: 10/21/2020, 3:38 PM  Clinical Narrative: Patient is an 87 year old male who is under observation for syncope and collapse. Patient here from [facility] for SNF. CSW called [employer] and spoke with Marlowe Kays to start insurance authorization for SNF. CSW received call from [place] with [employer]. Patient has been approved for SNF (authorization [id number]) for 7 days with a start date of 10/21/20.  Patient will now be held overnight. CSW confirmed with Marianna Fuss at [facility] the patient can return tomorrow, but will need a discharge summary by 1pm. Hospitalist updated. TOC to follow.  Expected Discharge Plan: [place] Barriers to Discharge: Continued Medical Work up  Patient Goals and CMS Choice Patient states their goals for this hospitalization and ongoing recovery are:: Return to [facility] CMS Medicare.gov Compare Post Acute Care list provided to:: Patient Choice offered to / list presented to : Patient  Expected Discharge Plan and Services Expected Discharge Plan: [place] In-house Referral: Clinical Social Work Discharge Planning Services: NA Post Acute Care Choice: [place] Living arrangements for the past 2 months: [place]             DME Arranged: N/A DME Agency: NA HH Arranged: NA [place] Agency: NA  Prior Living Arrangements/Services Living arrangements for the past 2 months: [place] Lives with:: Facility Resident Patient language and need for interpreter reviewed:: Yes Do you feel safe going back to the place where you live?: Yes      Need for Family Participation in Patient Care: Yes (Comment) Care giver support system in place?: Yes (comment) Current home services: DME Criminal Activity/Legal Involvement  Pertinent to Current Situation/Hospitalization: No - Comment as needed  Activities of Daily Living Home Assistive Devices/Equipment: Environmental consultant (specify type),Wheelchair,Cane (specify quad or straight) ADL Screening (condition at time of admission) Patient's cognitive ability adequate to safely complete daily activities?: Yes Is the patient deaf or have difficulty hearing?: Yes Does the patient have difficulty seeing, even when wearing glasses/contacts?: No Does the patient have difficulty concentrating, remembering, or making decisions?: No Patient able to express need for assistance with ADLs?: Yes Does the patient have difficulty dressing or bathing?: Yes Independently performs ADLs?: No Communication: Independent Dressing (OT): Needs assistance Is this a change from baseline?: Pre-admission baseline Grooming: Independent Feeding: Independent Bathing: Needs assistance Is this a change from baseline?: Pre-admission baseline Toileting: Needs assistance Is this a change from baseline?: Pre-admission baseline In/Out Bed: Needs assistance Is this a change from baseline?: Pre-admission baseline Walks in Home: Independent with device (comment) Does the patient have difficulty walking or climbing stairs?: Yes Weakness of Legs: Right Weakness of Arms/Hands: None  Permission Sought/Granted Permission sought to share information with : Chartered certified accountant granted to share info w AGENCY: [facility]  Emotional Assessment Appearance:: Appears younger than stated age Orientation: : Oriented to Self,Oriented to Place,Oriented to  Time,Oriented to Situation Alcohol / Substance Use: Not Applicable Psych Involvement: No (comment)  Admission diagnosis:  Syncope and collapse [R55] Patient Active Problem List   Diagnosis Date Noted  . Syncope and collapse 10/20/2020  . Hyperglycemia 10/19/2020  . Aortic atherosclerosis ([place]) 10/15/2020  . GERD without esophagitis 10/15/2020  .  Acute blood loss as cause of postoperative anemia 10/15/2020  . Vitamin D deficiency 10/15/2020  .  Closed right hip fracture ([place]) 10/09/2020  . Dysphagia 07/24/2019  . Mild protein-calorie malnutrition ([place]) 02/24/2019  . Gait instability 01/27/2019  . Right inguinal hernia   . Chronic diarrhea 12/06/2017  . Inguinal hernia 03/02/2014  . Constipation 03/02/2014  . Chronic respiratory failure with hypoxia ([place]) 12/09/2013  . Hemorrhoids 09/28/2013  . Hyperlipidemia 06/28/2013  . Sinusitis, chronic 11/28/2012  . COPD with acute exacerbation ([place]) 11/10/2012  . BPH (benign prostatic hyperplasia) 10/22/2012  . Insomnia 10/22/2012  . Pneumonia 09/05/2012  . Hypothyroidism 06/19/2012  . Atypical nevi 06/19/2012  . Seborrheic keratosis 06/19/2012  . History of prostate cancer 11/07/2010  . Allergic rhinitis 11/07/2010  . COPD GOLD 3/ group D  02 dep 11/07/2010   PCP:  Alycia Rossetti, MD Pharmacy:   [id number] - [place], [place] [address] [place] [address] Phone: [phone] Fax: [facility] - [place], [place] - [address] [place] [address] Phone: [phone] Fax: [phone]  Readmission Risk Interventions Readmission Risk Prevention Plan 10/11/2020  Medication Screening Complete  Transportation Screening Complete  Some recent data might be hidden

## 2020-10-21 NOTE — Progress Notes (Addendum)
Chronic Care Management Pharmacy Assistant   Name: John Black  [name]: [id number] DOB: 04/24/1929  Reason for Encounter: Disease State For COPD  Patient Questions:  1.  Have you seen any other providers since your last visit? Yes.   2.  Any changes in your medicines or health? Yes.   PCP : Alycia Rossetti, MD   Their chronic conditions include: allergic rhinitis, COPD, chronic diarrhea, hypothyroidism, BPH, mild hyperlipidemia.  Office Visits: 09/21/20 Dr. Minda Ditto F/U. STOPPED Ezetimibe 10 mg.  Consults: None since 09/05/20  Hospital: 10/20/2020 Kathie Dike, MD. Syncope and collapse. Per note patient is currently in the hospital.  10/09/20 Tat, Shanon Brow, MD For Acute right femoral neck fracture, Closed fracture of right hip. Surgery done on 10/10/20 ARTHROPLASTY BIPOLAR HIP (HEMIARTHROPLASTY). DISCHARGED on 10/12/20. Continue medications, no medication changes.  09/08/20 Mendel Corning, MD. For Stroke. STARTED Zetia 10 mg daily, Prednisone taper pack, and Aspirin 81 daily   Allergies:   Allergies  Allergen Reactions   Morphine     REACTION: sweats    Medications: Facility-Administered Encounter Medications as of 10/21/2020  Medication   acetaminophen (TYLENOL) tablet 650 mg   Or   acetaminophen (TYLENOL) suppository 650 mg   aspirin EC tablet 81 mg   feeding supplement (ENSURE ENLIVE / ENSURE PLUS) liquid 237 mL   fluticasone furoate-vilanterol (BREO ELLIPTA) 100-25 MCG/INH 1 puff   And   umeclidinium bromide (INCRUSE ELLIPTA) 62.5 MCG/INH 1 puff   guaiFENesin (MUCINEX) 12 hr tablet 600 mg   heparin injection 5,000 Units   HYDROcodone-acetaminophen (NORCO/VICODIN) 5-325 MG per tablet 1 tablet   levothyroxine (SYNTHROID) tablet 75 mcg   lidocaine-EPINEPHrine (XYLOCAINE W/EPI) 2 %-1:100000 (with pres) injection 20 mL   LORazepam (ATIVAN) tablet 0.5 mg   mirtazapine (REMERON) tablet 15 mg   ondansetron (ZOFRAN) tablet 4 mg   Or   ondansetron  (ZOFRAN) injection 4 mg   pantoprazole (PROTONIX) EC tablet 40 mg   polyethylene glycol (MIRALAX / GLYCOLAX) packet 17 g   sodium chloride flush (NS) 0.9 % injection 3 mL   tamsulosin (FLOMAX) capsule 0.4 mg   zolpidem (AMBIEN) tablet 5 mg   Outpatient Encounter Medications as of 10/21/2020  Medication Sig Note   acetaminophen (TYLENOL) 325 MG tablet Take 650 mg by mouth every 6 (six) hours as needed for mild pain.    aspirin EC 81 MG tablet Take 81 mg by mouth. Twice a day from 10/12/2020-11/22/2020 Then take once a day starting 11/23/2020    Cholecalciferol 1.25 MG (50000 UT) capsule Take 50,000 Units by mouth once a week. On Wednesday    Fluticasone-Umeclidin-Vilant (TRELEGY ELLIPTA) 100-62.5-25 MCG/INH AEPB Inhale 1 puff into the lungs daily.    guaiFENesin (MUCINEX) 600 MG 12 hr tablet Take 600 mg by mouth 2 (two) times daily as needed for cough.    HYDROcodone-acetaminophen (NORCO/VICODIN) 5-325 MG tablet Take 1 tablet by mouth every 6 (six) hours as needed for moderate pain.    levothyroxine (SYNTHROID) 75 MCG tablet TAKE 1 TABLET (75 MCG TOTAL) BY MOUTH DAILY. (Patient taking differently: Take 75 mcg by mouth daily before breakfast.)    LORazepam (ATIVAN) 0.5 MG tablet Take 1 tablet (0.5 mg total) by mouth 2 (two) times daily as needed for anxiety.    mirtazapine (REMERON) 15 MG tablet TAKE 1/2 TABLET BY MOUTH IN THE EVENING    NON FORMULARY Diet: _____ Regular, ___x___ NAS, _______Consistent Carbohydrate, _______NPO _____Other    OXYGEN Inhale 4 L into the  lungs continuous. 10/20/2020: Not on MAR   pantoprazole (PROTONIX) 40 MG tablet TAKE 1 TABLET BY MOUTH EVERY DAY    polyvinyl alcohol (LIQUIFILM TEARS) 1.4 % ophthalmic solution Place 1 drop into both eyes 3 (three) times daily as needed for dry eyes.    predniSONE (DELTASONE) 10 MG tablet Take 1 tablet (10 mg total) by mouth daily with breakfast. HOLD while on Prednisone taper    tamsulosin (FLOMAX) 0.4 MG CAPS capsule TAKE 1 CAPSULE BY  MOUTH EVERY DAY    zolpidem (AMBIEN) 5 MG tablet TAKE 1 TABLET BY MOUTH EVERY DAY AT BEDTIME AS NEEDED FOR SLEEP     Current Diagnosis: Patient Active Problem List   Diagnosis Date Noted   Syncope and collapse 10/20/2020   Hyperglycemia 10/19/2020   Aortic atherosclerosis ([place]) 10/15/2020   GERD without esophagitis 10/15/2020   Acute blood loss as cause of postoperative anemia 10/15/2020   Vitamin D deficiency 10/15/2020   Closed right hip fracture ([place]) 10/09/2020   Dysphagia 07/24/2019   Mild protein-calorie malnutrition ([place]) 02/24/2019   Gait instability 01/27/2019   Right inguinal hernia    Chronic diarrhea 12/06/2017   Inguinal hernia 03/02/2014   Constipation 03/02/2014   Chronic respiratory failure with hypoxia ([place]) 12/09/2013   Hemorrhoids 09/28/2013   Hyperlipidemia 06/28/2013   Sinusitis, chronic 11/28/2012   COPD with acute exacerbation ([place]) 11/10/2012   BPH (benign prostatic hyperplasia) 10/22/2012   Insomnia 10/22/2012   Pneumonia 09/05/2012   Hypothyroidism 06/19/2012   Atypical nevi 06/19/2012   Seborrheic keratosis 06/19/2012   History of prostate cancer 11/07/2010   Allergic rhinitis 11/07/2010   COPD GOLD 3/ group D  02 dep 11/07/2010    Goals Addressed   None    Current COPD regimen:  Ventolin HFA Trelegy Ellipta Prednisone 10 mg  No flowsheet data found.   Adherence Review: Does the patient have >5 day gap between last estimated fill date for maintenance inhaler medications? No  Finished my chart prep and realized the patient is currently in the hospital so I did not attempt to call the patient.   Follow-Up:  Pharmacist Review   Charlann Lange, [place] Pharmacist Assistant [phone]

## 2020-10-21 NOTE — Progress Notes (Signed)
   10/21/20 1418  Assess: MEWS Score  Temp 99.1 F (37.3 C)  BP (!) 98/59  Pulse Rate (!) 103  SpO2 98 %  Assess: MEWS Score  MEWS Temp 0  MEWS Systolic 1  MEWS Pulse 1  MEWS RR 0  MEWS LOC 0  MEWS Score 2  MEWS Score Color Yellow  Assess: if the MEWS score is Yellow or Red  Were vital signs taken at a resting state? Yes  Focused Assessment No change from prior assessment  Early Detection of Sepsis Score *See Row Information* Low  MEWS guidelines implemented *See Row Information* Yes  Treat  MEWS Interventions Escalated (See documentation below)  Pain Scale 0-10  Pain Score 0  Take Vital Signs  Increase Vital Sign Frequency  Yellow: Q 2hr X 2 then Q 4hr X 2, if remains yellow, continue Q 4hrs  Escalate  MEWS: Escalate Yellow: discuss with charge nurse/RN and consider discussing with provider and RRT  Notify: Charge Nurse/RN  Name of Charge Nurse/RN Notified Donavan Foil  Date Charge Nurse/RN Notified 10/21/20  Time Charge Nurse/RN Notified 1435  Notify: Provider  Provider Name/Title Dr Roderic Palau  Date Provider Notified 10/21/20  Time Provider Notified 1434  Notification Type Page  Notification Reason Other (Comment)  Response No new orders  Date of Provider Response 10/21/20  Time of Provider Response 1435

## 2020-10-21 NOTE — Plan of Care (Signed)
  Problem: Acute Rehab PT Goals(only PT should resolve) Goal: Pt Will Go Supine/Side To Sit 10/21/2020 1334 by Hartnett-Rands, Pamala Hurry, PT Outcome: Progressing Flowsheets (Taken 10/21/2020 1334) Pt will go Supine/Side to Sit: with supervision 10/21/2020 1334 by Hartnett-Rands, Pamala Hurry, PT Outcome: Progressing Goal: Pt Will Go Sit To Supine/Side 10/21/2020 1334 by Hartnett-Rands, Pamala Hurry, PT Outcome: Progressing Flowsheets (Taken 10/21/2020 1334) Pt will go Sit to Supine/Side: with supervision 10/21/2020 1334 by Hartnett-Rands, Pamala Hurry, PT Outcome: Progressing Goal: Patient Will Transfer Sit To/From Stand 10/21/2020 1334 by Jeannie Done, PT Outcome: Progressing Flowsheets (Taken 10/21/2020 1334) Patient will transfer sit to/from stand: with supervision 10/21/2020 1334 by Hartnett-Rands, Pamala Hurry, PT Outcome: Progressing Goal: Pt Will Transfer Bed To Chair/Chair To Bed 10/21/2020 1334 by Hartnett-Rands, Pamala Hurry, PT Outcome: Progressing Flowsheets (Taken 10/21/2020 1334) Pt will Transfer Bed to Chair/Chair to Bed: min guard assist 10/21/2020 1334 by Hartnett-Rands, Pamala Hurry, PT Outcome: Progressing Goal: Pt Will Ambulate 10/21/2020 1334 by Hartnett-Rands, Pamala Hurry, PT Outcome: Progressing Flowsheets (Taken 10/21/2020 1334) Pt will Ambulate:  50 feet  with rolling walker  with min guard assist 10/21/2020 1334 by Jeannie Done, PT Outcome: [place] D. Hartnett-Rands, MS, PT Per [place] [phone] 10/21/2020

## 2020-10-21 NOTE — Evaluation (Signed)
Physical Therapy Evaluation Patient Details Name: John Black [name]: [id number] DOB: 06/24/1929 Today's Date: 10/21/2020   History of Present Illness  John Black  is a 87 y.o. male, with hx of prostate cancer, hypothyroid, chronic respiratory failure, COPD, and recent hip hemiarthroplasty presents to the ED from the [facility] for syncope and collapse. Patient doesn't remember much of the event. He thinks he was in his wheelchair, going with the PT to the gym, when he woke up on the floor. He does not remember going down. Patient reports that he had some skin tears, but no pain. Patient reports no change in vision, hearing, headache, or neuro deficits. Prior to the fall, he was in his normal state of health. There was no chest pain, palpitations, dizziness, dyspnea, or other symptoms. Patient reports that he has been eating and drinking normally. He reports that his rameron works quite well to stimulate his appetite, and that he has gained 12 pounds since starting it. He reports that he had not had his ativan since then night prior. Patient reports no other symptoms. Former smoker, drinks 1/2 glass wine per night - but hasn't had any at [facility], no illicit drugs. Full code.  Last hospitalized 1/23 - 1/26 after mechanical fall. Hemiarthroplasty 1/24  Advised to stay on 81mg  BID for 6 weeks. [place] for rehab.   Clinical Impression  Pt admitted with above diagnosis. Wife present throughout evaluation. Patient agreeable to participating in PT evaluation today. Patient performs bed mobility with min guard for safety. Patient requires verbal cues for sequencing of steps and placement of hands during transfers and ambulation with RW. Patient mobilized on 5 L O2 with 101 bpm HR and 96% SpO2 upon sitting. Pt currently with functional limitations due to the deficits listed below (see PT Problem List). Pt will benefit from skilled PT to increase their independence and safety with mobility to  allow discharge to the venue listed below.       Follow Up Recommendations SNF;Supervision/Assistance - 24 hour    Equipment Recommendations  None recommended by PT    Recommendations for Other Services       Precautions / Restrictions Precautions Precautions: Posterior Hip;Fall Precaution Booklet Issued: No Restrictions Weight Bearing Restrictions: Yes RLE Weight Bearing: Weight bearing as tolerated (WBAT on physicians orders for prior admission)      Mobility  Bed Mobility Overal bed mobility: Needs Assistance Bed Mobility: Sidelying to Sit   Sidelying to sit: Min guard;HOB elevated   Transfers Overall transfer level: Needs assistance Equipment used: Rolling walker (2 wheeled) Transfers: Sit to/from [facility] Sit to Stand: Min guard Stand pivot transfers: Min guard       General transfer comment: min guard for safety; cues for sequencing of steps and placement of hands  Ambulation/Gait Ambulation/Gait assistance: Min guard Gait Distance (Feet): 40 Feet Assistive device: Rolling walker (2 wheeled) Gait Pattern/deviations: Step-through pattern;Decreased step length - right;Decreased step length - left;Decreased stride length;Trunk flexed;Narrow base of support Gait velocity: decreased   General Gait Details: somewhat slow labored gait with RW and 5 LPM O2; limited by fatigue; bears weight through Right lower extremity well  Stairs            Wheelchair Mobility    Modified Rankin (Stroke Patients Only)       Balance Overall balance assessment: Needs assistance Sitting-balance support: Bilateral upper extremity supported;Feet supported Sitting balance-Leahy Scale: Fair     Standing balance support: Bilateral upper extremity supported;During functional  activity Standing balance-Leahy Scale: Fair Standing balance comment: fair with RW         Pertinent Vitals/Pain Pain Assessment: 0-10 Pain Score: 3  Pain Location: left  elbow and knees, chest - all soreness connected to fall Pain Descriptors / Indicators: Sore Pain Intervention(s): Limited activity within patient's tolerance;Monitored during session;Repositioned    Home Living Family/patient expects to be discharged to:: Skilled nursing facility Living Arrangements: Spouse/significant other;Children Available Help at Discharge: Family Type of Home: House Home Access: Level entry     Home Layout: Multi-level Home Equipment: Environmental consultant - 2 wheels;Walker - 4 wheels;Cane - single point;Shower seat - built in;Hand held shower head;Grab bars - tub/shower;Transport chair Additional Comments: pt typically on 4L [place]    Prior Function Level of Independence: Needs assistance   Gait / Transfers Assistance Needed: pt ambulates independently in the home, utilizes rollator for community mobility  ADL's / Homemaking Assistance Needed: Occasional assist with ADLs on "bad" days. Typically patient is Mod I with bathing/dressing/toileting. Wears incontinence briefs and reports ~5 episodes of urinary incontinence daily.        Hand Dominance   Dominant Hand: Right    Extremity/Trunk Assessment   Upper Extremity Assessment Upper Extremity Assessment: Generalized weakness    Lower Extremity Assessment Lower Extremity Assessment: Generalized weakness       Communication   Communication: No difficulties  Cognition Arousal/Alertness: Awake/alert Behavior During Therapy: WFL for tasks assessed/performed Overall Cognitive Status: Within Functional Limits for tasks assessed     General Comments      Exercises     Assessment/Plan    PT Assessment Patient needs continued PT services  PT Problem List Decreased strength;Decreased knowledge of use of DME;Decreased activity tolerance;Decreased balance;Decreased mobility       PT Treatment Interventions DME instruction;Balance training;Gait training;Neuromuscular re-education;Patient/family education;Therapeutic  activities;Therapeutic exercise    PT Goals (Current goals can be found in the Care Plan section)  Acute Rehab PT Goals Patient Stated Goal: Return to [facility] to get more independent prior to returning to home. PT Goal Formulation: With patient/family Time For Goal Achievement: 11/04/20 Potential to Achieve Goals: Fair    Frequency Min 3X/week   Barriers to discharge           AM-PAC PT "6 Clicks" Mobility  Outcome Measure Help needed turning from your back to your side while in a flat bed without using bedrails?: A Little Help needed moving from lying on your back to sitting on the side of a flat bed without using bedrails?: A Little Help needed moving to and from a bed to a chair (including a wheelchair)?: A Little Help needed standing up from a chair using your arms (e.g., wheelchair or bedside chair)?: A Little Help needed to walk in hospital room?: A Little Help needed climbing 3-5 steps with a railing? : A Lot 6 Click Score: 17    End of Session Equipment Utilized During Treatment: Gait belt;Oxygen Activity Tolerance: Patient limited by fatigue Patient left: in chair;with call bell/phone within reach;with chair alarm set;with family/visitor present Nurse Communication: Mobility status PT Visit Diagnosis: Unsteadiness on feet (R26.81);Other abnormalities of gait and mobility (R26.89);Repeated falls (R29.6);Muscle weakness (generalized) (M62.81)    Time: 7322-0254 PT Time Calculation (min) (ACUTE ONLY): 30 min   Charges:   PT Evaluation $PT Eval Low Complexity: 1 Low PT Treatments $Therapeutic Activity: 8-22 mins        Floria Raveling. Hartnett-Rands, MS, PT Per [place] [phone] 10/21/2020, 1:30  PM   

## 2020-10-22 ENCOUNTER — Inpatient Hospital Stay
Admission: RE | Admit: 2020-10-22 | Discharge: 2020-11-21 | Disposition: A | Discharge status: Home / Self Care | Payer: PPO | Source: Ambulatory Visit | Attending: Internal Medicine | Admitting: Internal Medicine

## 2020-10-22 ENCOUNTER — Observation Stay (HOSPITAL_BASED_OUTPATIENT_CLINIC_OR_DEPARTMENT_OTHER): Payer: PPO

## 2020-10-22 DIAGNOSIS — E039 Hypothyroidism, unspecified: Secondary | ICD-10-CM | POA: Diagnosis not present

## 2020-10-22 DIAGNOSIS — R55 Syncope and collapse: Secondary | ICD-10-CM

## 2020-10-22 DIAGNOSIS — J449 Chronic obstructive pulmonary disease, unspecified: Secondary | ICD-10-CM | POA: Diagnosis not present

## 2020-10-22 DIAGNOSIS — J9611 Chronic respiratory failure with hypoxia: Secondary | ICD-10-CM | POA: Diagnosis not present

## 2020-10-22 LAB — GLUCOSE, CAPILLARY
Glucose-Capillary: 91 mg/dL (ref 70–99)
Glucose-Capillary: 80 mg/dL (ref 70–99)

## 2020-10-22 LAB — ECHOCARDIOGRAM COMPLETE
Area-P 1/2: 3.53 cm2
Height: 66 in
S' Lateral: 2.89 cm
Weight: 2014.12 oz

## 2020-10-22 MED ORDER — PANTOPRAZOLE SODIUM 40 MG PO TBEC: 40.0000 mg | DELAYED_RELEASE_TABLET | Freq: Two times a day (BID) | ORAL | 1 refills | Status: DC

## 2020-10-22 MED ORDER — LORAZEPAM 0.5 MG PO TABS: 0.5000 mg | ORAL_TABLET | Freq: Two times a day (BID) | ORAL | 0 refills | Status: DC | PRN

## 2020-10-22 NOTE — Discharge Summary (Signed)
Physician Discharge Summary  John Black [id number] DOB: [birth year] DOA: 10/20/2020  PCP: Alycia Rossetti, MD  Admit date: 10/20/2020 Discharge date: 10/22/2020  Admitted From: Skilled nursing facility Disposition: Skilled nursing facility  Recommendations for Outpatient Follow-up:  1. Follow up with PCP in 1-2 weeks 2. Please obtain BMP/CBC in one week  Discharge Condition: Stable CODE STATUS: Full code Diet recommendation: Heart healthy  Brief/Interim Summary: 87 year old male who was recently in the hospital for hip fracture after mechanical fall, was discharged to skilled nursing facility.  He was brought back to the emergency room after possible syncopal event and fall.  He denies any chest pain.  Work-up in the emergency room was negative including CT head, CT C-spine, CT angiogram of chest.  Carotid Dopplers were also unrevealing.  Currently echocardiogram is pending.  He is mildly orthostatic.  Suspect that he may have some degree of dehydration and is receiving IV fluids.  Discharge Diagnoses:  Principal Problem:   Syncope and collapse Active Problems:   COPD GOLD 3/ group D  02 dep   Hypothyroidism   Chronic respiratory failure with hypoxia (HCC)   Mild protein-calorie malnutrition (HCC)  Syncope and collapse -CT head and C-spine is unremarkable -No evidence of infection -carotid Dopplers unremarkable -Echocardiogram unrevealing -CT angiogram of the chest without any evidence of PE -Orthostatic vital signs do show elevated heart rate on standing -Suspect that he may have some degree of dehydration versus contributing to his symptoms -He received gentle hydration while in the hospital and overall symptoms have improved -He has not had any recurrent episodes of syncope and has no dizziness on standing  Mild protein calorie malnutrition -Continue feeding supplement between meals  Anemia -Likely related to recent surgery -Patient is also FOBT positive, but  he has brown stools and no gross evidence of bleeding -We will hold off on GI evaluation unless patient develops gross bleeding or significant decline in hemoglobin, since any procedures would be high risk considering respiratory status -Continue to follow CBC as an outpatient -We will continue on Protonix  COPD -Oxygen dependent -Respiratory status appears to be at baseline -Continue on Trelegy  Hypothyroidism -Continue on Synthroid  GERD -Continue on PPI  Discharge Instructions  Discharge Instructions    Diet - low sodium heart healthy   Complete by: As directed    Increase activity slowly   Complete by: As directed      Allergies as of 10/22/2020      Reactions   Morphine    REACTION: sweats      Medication List    STOP taking these medications   HYDROcodone-acetaminophen 5-325 MG tablet Commonly known as: NORCO/VICODIN     TAKE these medications   acetaminophen 325 MG tablet Commonly known as: TYLENOL Take 650 mg by mouth every 6 (six) hours as needed for mild pain.   aspirin EC 81 MG tablet Take 81 mg by mouth. Twice a day from 10/12/2020-11/22/2020 Then take once a day starting 11/23/2020   Cholecalciferol 1.25 MG (50000 UT) capsule Take 50,000 Units by mouth once a week. On Wednesday   guaiFENesin 600 MG 12 hr tablet Commonly known as: MUCINEX Take 600 mg by mouth 2 (two) times daily as needed for cough.   levothyroxine 75 MCG tablet Commonly known as: SYNTHROID TAKE 1 TABLET (75 MCG TOTAL) BY MOUTH DAILY. What changed: See the new instructions.   LORazepam 0.5 MG tablet Commonly known as: ATIVAN Take 1 tablet (0.5 mg total) by mouth 2 (  two) times daily as needed for anxiety.   mirtazapine 15 MG tablet Commonly known as: REMERON TAKE 1/2 TABLET BY MOUTH IN THE EVENING   NON FORMULARY Diet: _____ Regular, ___x___ NAS, _______Consistent Carbohydrate, _______NPO _____Other   OXYGEN Inhale 4 L into the lungs continuous.   pantoprazole 40 MG  tablet Commonly known as: PROTONIX Take 1 tablet (40 mg total) by mouth 2 (two) times daily. What changed: when to take this   polyvinyl alcohol 1.4 % ophthalmic solution Commonly known as: LIQUIFILM TEARS Place 1 drop into both eyes 3 (three) times daily as needed for dry eyes.   predniSONE 10 MG tablet Commonly known as: DELTASONE Take 1 tablet (10 mg total) by mouth daily with breakfast. HOLD while on Prednisone taper   tamsulosin 0.4 MG Caps capsule Commonly known as: FLOMAX TAKE 1 CAPSULE BY MOUTH EVERY DAY   Trelegy Ellipta 100-62.5-25 MCG/INH Aepb Generic drug: Fluticasone-Umeclidin-Vilant Inhale 1 puff into the lungs daily.   zolpidem 5 MG tablet Commonly known as: AMBIEN TAKE 1 TABLET BY MOUTH EVERY DAY AT BEDTIME AS NEEDED FOR SLEEP       Contact information for after-discharge care    [facility] .   Service: Skilled Nursing Contact information: [address] [place] [address] [phone]                 Allergies  Allergen Reactions  . Morphine     REACTION: sweats    Consultations:     Procedures/Studies: DG Shoulder Right  Result Date: 10/09/2020 CLINICAL DATA:  Status post fall with right arm pain. EXAM: RIGHT SHOULDER - 2+ VIEW COMPARISON:  Chest x-ray October 09, 2020 FINDINGS: There is no evidence of fracture or dislocation. Degenerative joint changes of the right shoulder identified. Chronic scar of the right apex is noted. IMPRESSION: No acute fracture or dislocation. Electronically Signed   By: Abelardo Diesel M.D.   On: 10/09/2020 15:44   CT HEAD WO CONTRAST  Result Date: 10/20/2020 CLINICAL DATA:  Facial trauma. Additional provided: Fall, swelling above left eye, no loss of consciousness, alert and oriented. EXAM: CT HEAD WITHOUT CONTRAST CT CERVICAL SPINE WITHOUT CONTRAST TECHNIQUE: Multidetector CT imaging of the head and cervical spine was performed following the  standard protocol without intravenous contrast. Multiplanar CT image reconstructions of the cervical spine were also generated. COMPARISON:  Brain MRI/MRA 09/09/2020.  Head CT 09/08/2020. FINDINGS: CT HEAD FINDINGS Brain: Mildly motion degraded exam. Mild-to-moderate cerebral atrophy. Redemonstrated small chronic cortical infarcts within the bilateral frontal lobes, left parietal lobe and right occipital lobe. Background advanced ill-defined hypoattenuation within the cerebral white matter is nonspecific, but compatible with chronic small vessel ischemic disease. Known small chronic infarcts within the bilateral cerebellar hemispheres better appreciated on the prior MRI of 09/09/2020. There is no acute intracranial hemorrhage. No acute demarcated cortical infarct. No extra-axial fluid collection. No evidence of intracranial mass. No midline shift. Vascular: No hyperdense vessel.  Atherosclerotic calcifications. Skull: Normal. Negative for fracture or focal lesion. Sinuses/Orbits: Visualized orbits show no acute finding. Mild frontal, ethmoid and maxillary sinus mucosal thickening at the imaged levels. Small right maxillary sinus mucous retention cyst. CT CERVICAL SPINE FINDINGS Mildly motion degraded exam. Alignment: Nonspecific reversal of the expected cervical lordosis. Trace C2-C3 grade 1 anterolisthesis. Trace C5-C6 grade 1 retrolisthesis. Skull base and vertebrae: The basion-dental and atlanto-dental intervals are maintained.No evidence of acute fracture to the cervical spine. Soft tissues and spinal canal:  No prevertebral fluid or swelling. No visible canal hematoma. Disc levels: Cervical spondylosis with multilevel disc space narrowing, disc bulges, posterior disc osteophytes, uncovertebral hypertrophy and facet arthrosis. At C4-C5, there is vertebral fusion and right facet joint ankylosis. Disc space narrowing is severe at C3-C4, C5-C6, C6-C7 and C7-T1. Small caudally migrated central disc extrusion at C2-C3  contributing to apparent moderate spinal canal stenosis. Multilevel up to moderate spinal canal stenosis at the remaining levels. Multilevel bony neural foraminal narrowing. Bony neural foraminal narrowing. Additionally, there is prominent ligamentous hypertrophy/pannus formation posterior to the dens contributing to mild C1-C2 canal narrowing. Upper chest: Biapical bullous emphysema. IMPRESSION: CT head: 1. Mildly motion degraded exam. 2. No evidence of acute intracranial abnormality. 3. Redemonstrated small chronic cortical infarcts within the bilateral frontal lobes, left parietal and right occipital lobes. 4. Stable background mild-to-moderate cerebral atrophy and severe chronic small vessel ischemic disease. 5. Known small chronic infarcts within the bilateral cerebellar hemispheres. 6. Mild paranasal disease, as described. CT cervical spine: 1. Mildly motion degraded exam. 2. No evidence of acute fracture to the cervical spine. 3. Nonspecific reversal of the expected cervical lordosis. 4. Minimal C2-C3 grade 1 anterolisthesis and C5-C6 grade 1 retrolisthesis. 5. Cervical spondylosis as described with up to moderate appreciable spinal canal stenosis and multilevel bony neural foraminal narrowing. Degenerative fusion at C4-C5. 6. Biapical bullous emphysema (emphysema (ICD10-J43.9)). Electronically Signed   By: Kellie Simmering DO   On: 10/20/2020 18:33   CT Angio Chest PE W and/or Wo Contrast  Result Date: 10/20/2020 CLINICAL DATA:  87 year old male with syncope. EXAM: CT ANGIOGRAPHY CHEST WITH CONTRAST TECHNIQUE: Multidetector CT imaging of the chest was performed using the standard protocol during bolus administration of intravenous contrast. Multiplanar CT image reconstructions and MIPs were obtained to evaluate the vascular anatomy. CONTRAST:  162mL OMNIPAQUE IOHEXOL 350 MG/ML SOLN COMPARISON:  Chest CT dated 02/17/2020. FINDINGS: Cardiovascular: There is no cardiomegaly or pericardial effusion. Three-vessel  coronary vascular calcification. Advanced atherosclerotic calcification of the thoracic aorta. No aneurysmal dilatation or dissection. No pulmonary artery embolus identified. Mediastinum/Nodes: No hilar or mediastinal adenopathy. The esophagus is grossly unremarkable. No mediastinal fluid collection. Lungs/Pleura: Severe emphysema. Right upper lobe scarring and architectural distortion. There is a 6 mm left lower lobe nodule (77/6). No consolidative changes. There is no pleural effusion pneumothorax. Bilateral calcified pleural plaques. The central airways are patent. Upper Abdomen: No acute abnormality. Musculoskeletal: Osteopenia with degenerative changes of the spine. Old appearing bilateral rib fractures. No acute osseous pathology. Review of the MIP images confirms the above findings. IMPRESSION: 1. No acute intrathoracic pathology. No CT evidence of pulmonary artery embolus. 2. Severe emphysema with right upper lobe scarring and architectural distortion. 3. A 6 mm left lower lobe nodule. Non-contrast chest CT at 6-12 months is recommended. If the nodule is stable at time of repeat CT, then future CT at 18-24 months (from today's scan) is considered optional for low-risk patients, but is recommended for high-risk patients. This recommendation follows the consensus statement: Guidelines for Management of Incidental Pulmonary Nodules Detected on CT Images: From the Fleischner Society 2017; Radiology 2017; 284:228-243. 4. Aortic Atherosclerosis (ICD10-I70.0) and Emphysema (ICD10-J43.9). Electronically Signed   By: Anner Crete M.D.   On: 10/20/2020 21:37   CT CERVICAL SPINE WO CONTRAST  Result Date: 10/20/2020 CLINICAL DATA:  Facial trauma. Additional provided: Fall, swelling above left eye, no loss of consciousness, alert and oriented. EXAM: CT HEAD WITHOUT CONTRAST CT CERVICAL SPINE WITHOUT CONTRAST TECHNIQUE: Multidetector CT imaging of the  head and cervical spine was performed following the standard  protocol without intravenous contrast. Multiplanar CT image reconstructions of the cervical spine were also generated. COMPARISON:  Brain MRI/MRA 09/09/2020.  Head CT 09/08/2020. FINDINGS: CT HEAD FINDINGS Brain: Mildly motion degraded exam. Mild-to-moderate cerebral atrophy. Redemonstrated small chronic cortical infarcts within the bilateral frontal lobes, left parietal lobe and right occipital lobe. Background advanced ill-defined hypoattenuation within the cerebral white matter is nonspecific, but compatible with chronic small vessel ischemic disease. Known small chronic infarcts within the bilateral cerebellar hemispheres better appreciated on the prior MRI of 09/09/2020. There is no acute intracranial hemorrhage. No acute demarcated cortical infarct. No extra-axial fluid collection. No evidence of intracranial mass. No midline shift. Vascular: No hyperdense vessel.  Atherosclerotic calcifications. Skull: Normal. Negative for fracture or focal lesion. Sinuses/Orbits: Visualized orbits show no acute finding. Mild frontal, ethmoid and maxillary sinus mucosal thickening at the imaged levels. Small right maxillary sinus mucous retention cyst. CT CERVICAL SPINE FINDINGS Mildly motion degraded exam. Alignment: Nonspecific reversal of the expected cervical lordosis. Trace C2-C3 grade 1 anterolisthesis. Trace C5-C6 grade 1 retrolisthesis. Skull base and vertebrae: The basion-dental and atlanto-dental intervals are maintained.No evidence of acute fracture to the cervical spine. Soft tissues and spinal canal: No prevertebral fluid or swelling. No visible canal hematoma. Disc levels: Cervical spondylosis with multilevel disc space narrowing, disc bulges, posterior disc osteophytes, uncovertebral hypertrophy and facet arthrosis. At C4-C5, there is vertebral fusion and right facet joint ankylosis. Disc space narrowing is severe at C3-C4, C5-C6, C6-C7 and C7-T1. Small caudally migrated central disc extrusion at C2-C3  contributing to apparent moderate spinal canal stenosis. Multilevel up to moderate spinal canal stenosis at the remaining levels. Multilevel bony neural foraminal narrowing. Bony neural foraminal narrowing. Additionally, there is prominent ligamentous hypertrophy/pannus formation posterior to the dens contributing to mild C1-C2 canal narrowing. Upper chest: Biapical bullous emphysema. IMPRESSION: CT head: 1. Mildly motion degraded exam. 2. No evidence of acute intracranial abnormality. 3. Redemonstrated small chronic cortical infarcts within the bilateral frontal lobes, left parietal and right occipital lobes. 4. Stable background mild-to-moderate cerebral atrophy and severe chronic small vessel ischemic disease. 5. Known small chronic infarcts within the bilateral cerebellar hemispheres. 6. Mild paranasal disease, as described. CT cervical spine: 1. Mildly motion degraded exam. 2. No evidence of acute fracture to the cervical spine. 3. Nonspecific reversal of the expected cervical lordosis. 4. Minimal C2-C3 grade 1 anterolisthesis and C5-C6 grade 1 retrolisthesis. 5. Cervical spondylosis as described with up to moderate appreciable spinal canal stenosis and multilevel bony neural foraminal narrowing. Degenerative fusion at C4-C5. 6. Biapical bullous emphysema (emphysema (ICD10-J43.9)). Electronically Signed   By: Kellie Simmering DO   On: 10/20/2020 18:33   US Carotid Bilateral  Result Date: 10/21/2020 CLINICAL DATA:  Syncope with collapse EXAM: BILATERAL CAROTID DUPLEX ULTRASOUND TECHNIQUE: Pearline Cables scale imaging, color Doppler and duplex ultrasound were performed of bilateral carotid and vertebral arteries in the neck. COMPARISON:  MRA neck 09/09/2020 FINDINGS: Criteria: Quantification of carotid stenosis is based on velocity parameters that correlate the residual internal carotid diameter with NASCET-based stenosis levels, using the diameter of the distal internal carotid lumen as the denominator for stenosis  measurement. The following velocity measurements were obtained: RIGHT ICA: 69/15 cm/sec CCA: [id number] cm/sec SYSTOLIC ICA/CCA RATIO:  1.3 ECA: 74 cm/sec LEFT ICA: 108/16 cm/sec CCA: [id number] cm/sec SYSTOLIC ICA/CCA RATIO:  1.5 ECA: 63 cm/sec RIGHT CAROTID ARTERY: Heterogeneous calcified plaque of the internal carotid artery origin and distal common carotid artery without hemodynamically  significant stenosis. RIGHT VERTEBRAL ARTERY:  Antegrade flow. LEFT CAROTID ARTERY: Heterogeneous plaque of the distal common carotid artery extending into the left internal carotid artery origin without flow limiting stenosis. LEFT VERTEBRAL ARTERY:  Antegrade flow. IMPRESSION: No hemodynamically significant stenosis of the internal carotid arteries. Electronically Signed   By: Miachel Roux M.D.   On: 10/21/2020 14:13   Chest Portable 1 View  Result Date: 10/09/2020 CLINICAL DATA:  Preop for surgery. EXAM: PORTABLE CHEST 1 VIEW COMPARISON:  September 08, 2020 FINDINGS: The heart size and mediastinal contours are within normal limits. Scarring of the right upper lobe is unchanged. Emphysematous changes are stable. The visualized skeletal structures are unremarkable. IMPRESSION: No active cardiopulmonary disease identified. Electronically Signed   By: Abelardo Diesel M.D.   On: 10/09/2020 13:47   DG HIP OPERATIVE UNILAT WITH PELVIS RIGHT  Result Date: 10/10/2020 CLINICAL DATA:  Status post right hip replacement EXAM: OPERATIVE RIGHT HIP WITH PELVIS COMPARISON:  10/09/2020, CT from 02/04/2020 FINDINGS: Pelvic ring is intact. Prostate brachytherapy seeds are noted. Right hip prosthesis is now seen in satisfactory position. Changes consistent with the known right inguinal hernia are again seen. No car serration is noted. IMPRESSION: Status post right hip prosthesis. Changes of right inguinal hernia stable from prior CT. Electronically Signed   By: Inez Catalina M.D.   On: 10/10/2020 14:50   DG Hip Unilat W or Wo Pelvis 2-3 Views  Right  Result Date: 10/09/2020 CLINICAL DATA:  Status post fall.  Right hip pain. EXAM: DG HIP (WITH OR WITHOUT PELVIS) 2-3V RIGHT COMPARISON:  CT abdomen pelvis Feb 04, 2020 FINDINGS: There is a foreshortened fracture through the right femoral neck. Mild right hip joint degenerative changes. Vascular calcifications. IMPRESSION: Right femoral neck fracture. Electronically Signed   By: Lovey Newcomer M.D.   On: 10/09/2020 07:33       Subjective: Feeling better.  He is somewhat tired today.  Says he got his sleep medication late last night.  Denies any shortness of breath.  No dizziness.  Discharge Exam: Vitals:   10/21/20 2000 10/22/20 0449 10/22/20 0500 10/22/20 0833  BP: 110/63 127/84    Pulse: 98 96    Resp: 17 17    Temp: 98.3 F (36.8 C) 97.8 F (36.6 C)    TempSrc:      SpO2: 93% 94%  98%  Weight:   57.1 kg   Height:        General: Pt is alert, awake, not in acute distress Cardiovascular: RRR, S1/S2 +, no rubs, no gallops Respiratory: CTA bilaterally, no wheezing, no rhonchi Abdominal: Soft, NT, ND, bowel sounds + Extremities: no edema, no cyanosis    The results of significant diagnostics from this hospitalization (including imaging, microbiology, ancillary and laboratory) are listed below for reference.     Microbiology: Recent Results (from the past 240 hour(s))  SARS CORONAVIRUS 2 (TAT 6-24 HRS) Nasopharyngeal Nasopharyngeal Swab     Status: None   Collection Time: 10/20/20 11:00 PM   Specimen: Nasopharyngeal Swab  Result Value Ref Range Status   SARS Coronavirus 2 NEGATIVE NEGATIVE Final    Comment: (NOTE) SARS-CoV-2 target nucleic acids are NOT DETECTED.  The SARS-CoV-2 RNA is generally detectable in upper and lower respiratory specimens during the acute phase of infection. Negative results do not preclude SARS-CoV-2 infection, do not rule out co-infections with other pathogens, and should not be used as the sole basis for treatment or other patient  management decisions. Negative results must be  combined with clinical observations, patient history, and epidemiological information. The expected result is Negative.  Fact Sheet for Patients: SugarRoll.be  Fact Sheet for Healthcare Providers: https://www.woods-mathews.com/  This test is not yet approved or cleared by the [place] FDA and  has been authorized for detection and/or diagnosis of SARS-CoV-2 by FDA under an Emergency Use Authorization (EUA). This EUA will remain  in effect (meaning this test can be used) for the duration of the COVID-19 declaration under Se ction 564(b)(1) of the Act, 21 U.S.C. section 360bbb-3(b)(1), unless the authorization is terminated or revoked sooner.  Performed at [facility], [place] [address] [place], [place] [address]      Labs: BNP (last 3 results) Recent Labs    02/16/20 2256  BNP 20.2   Basic Metabolic Panel: Recent Labs  Lab 10/20/20 1700 10/21/20 0504  NA 133* 137  K 5.3* 4.0  CL 97* 96*  CO2 29 32  GLUCOSE 137* 94  BUN 22 16  CREATININE 1.06 0.78  CALCIUM 8.9 9.5   Liver Function Tests: Recent Labs  Lab 10/20/20 1700 10/21/20 0504  AST 36 32  ALT 51* 52*  ALKPHOS 69 74  BILITOT 0.5 0.5  PROT 6.3* 6.5  ALBUMIN 3.2* 3.4*   No results for input(s): LIPASE, AMYLASE in the last 168 hours. No results for input(s): AMMONIA in the last 168 hours. CBC: Recent Labs  Lab 10/20/20 1700 10/21/20 0504  WBC 11.1* 14.3*  NEUTROABS 8.9*  --   HGB 11.0* 11.7*  HCT 35.6* 37.7*  MCV 96.7 96.4  PLT 444* 517*   Cardiac Enzymes: No results for input(s): CKTOTAL, CKMB, CKMBINDEX, TROPONINI in the last 168 hours. BNP: Invalid input(s): POCBNP CBG: Recent Labs  Lab 10/20/20 1843 10/22/20 0731 10/22/20 1105  GLUCAP 108* 91 80   D-Dimer No results for input(s): DDIMER in the last 72 hours. Hgb A1c No results for input(s): HGBA1C in the last 72 hours. Lipid  Profile No results for input(s): CHOL, HDL, LDLCALC, TRIG, CHOLHDL, LDLDIRECT in the last 72 hours. Thyroid function studies Recent Labs    10/21/20 0504  TSH 3.236   Anemia work up No results for input(s): VITAMINB12, FOLATE, FERRITIN, TIBC, IRON, RETICCTPCT in the last 72 hours. Urinalysis    Component Value Date/Time   COLORURINE YELLOW 10/20/2020 1950   APPEARANCEUR CLEAR 10/20/2020 1950   LABSPEC 1.015 10/20/2020 1950   PHURINE 7.0 10/20/2020 1950   GLUCOSEU NEGATIVE 10/20/2020 1950   HGBUR NEGATIVE 10/20/2020 1950   BILIRUBINUR NEGATIVE 10/20/2020 1950   KETONESUR NEGATIVE 10/20/2020 1950   PROTEINUR NEGATIVE 10/20/2020 1950   UROBILINOGEN 0.2 10/02/2013 1208   NITRITE NEGATIVE 10/20/2020 1950   LEUKOCYTESUR NEGATIVE 10/20/2020 1950   Sepsis Labs Invalid input(s): PROCALCITONIN,  WBC,  LACTICIDVEN Microbiology Recent Results (from the past 240 hour(s))  SARS CORONAVIRUS 2 (TAT 6-24 HRS) Nasopharyngeal Nasopharyngeal Swab     Status: None   Collection Time: 10/20/20 11:00 PM   Specimen: Nasopharyngeal Swab  Result Value Ref Range Status   SARS Coronavirus 2 NEGATIVE NEGATIVE Final    Comment: (NOTE) SARS-CoV-2 target nucleic acids are NOT DETECTED.  The SARS-CoV-2 RNA is generally detectable in upper and lower respiratory specimens during the acute phase of infection. Negative results do not preclude SARS-CoV-2 infection, do not rule out co-infections with other pathogens, and should not be used as the sole basis for treatment or other patient management decisions. Negative results must be combined with clinical observations, patient history, and epidemiological  information. The expected result is Negative.  Fact Sheet for Patients: SugarRoll.be  Fact Sheet for Healthcare Providers: https://www.woods-mathews.com/  This test is not yet approved or cleared by the [place] FDA and  has been authorized for detection  and/or diagnosis of SARS-CoV-2 by FDA under an Emergency Use Authorization (EUA). This EUA will remain  in effect (meaning this test can be used) for the duration of the COVID-19 declaration under Se ction 564(b)(1) of the Act, 21 U.S.C. section 360bbb-3(b)(1), unless the authorization is terminated or revoked sooner.  Performed at [facility], [place] [address], [place], [place] [address]      Time coordinating discharge: 73mins  SIGNED:   Kathie Dike, MD  [facility] 10/22/2020, 11:55 AM   If 7PM-7AM, please contact night-coverage [email or web address]

## 2020-10-22 NOTE — TOC Transition Note (Signed)
Transition of Care [facility]) - CM/SW Discharge Note   Patient Details  Name: John Black [name]: [id number] Date of Birth: Feb 12, 1929  Transition of Care [facility]) CM/SW Contact:  Natasha Bence, LCSW Phone Number: 10/22/2020, 12:52 PM   Clinical Narrative:    CSW notified [place] of patient's discharge. Patient's family reported concerns of patient's falling. Family reported that they will be discussing a plan of action to prevent falling with [facility]. [place] agreeable to take patient. Nurse to call report. TOC signing off.    Final next level of care: Skilled Nursing Facility Barriers to Discharge: Barriers Resolved   Patient Goals and CMS Choice Patient states their goals for this hospitalization and ongoing recovery are:: SNF CMS Medicare.gov Compare Post Acute Care list provided to:: Patient Choice offered to / list presented to : Patient  Discharge Placement                Patient to be transferred to facility by: [facility] Name of family member notified: Theresia Bough Patient and family notified of of transfer: 10/22/20  Discharge Plan and Services In-house Referral: Clinical Social Work Discharge Planning Services: NA Post Acute Care Choice: [place]          DME Arranged: N/A DME Agency: NA       HH Arranged: NA HH Agency: NA        Social Determinants of Health (SDOH) Interventions     Readmission Risk Interventions Readmission Risk Prevention Plan 10/11/2020  Medication Screening Complete  Transportation Screening Complete  Some recent data might be hidden

## 2020-10-22 NOTE — Progress Notes (Signed)
*  PRELIMINARY RESULTS* Echocardiogram 2D Echocardiogram has been performed.  Leavy Cella 10/22/2020, 12:04 PM

## 2020-10-24 ENCOUNTER — Non-Acute Institutional Stay (SKILLED_NURSING_FACILITY): Payer: PPO | Admitting: Adult Health

## 2020-10-24 ENCOUNTER — Encounter: Payer: Self-pay | Admitting: Adult Health

## 2020-10-24 DIAGNOSIS — E559 Vitamin D deficiency, unspecified: Secondary | ICD-10-CM | POA: Diagnosis not present

## 2020-10-24 DIAGNOSIS — S72001D Fracture of unspecified part of neck of right femur, subsequent encounter for closed fracture with routine healing: Secondary | ICD-10-CM | POA: Diagnosis not present

## 2020-10-24 DIAGNOSIS — E441 Mild protein-calorie malnutrition: Secondary | ICD-10-CM

## 2020-10-24 DIAGNOSIS — I700 Atherosclerosis of aorta: Secondary | ICD-10-CM | POA: Diagnosis not present

## 2020-10-24 DIAGNOSIS — J449 Chronic obstructive pulmonary disease, unspecified: Secondary | ICD-10-CM | POA: Diagnosis not present

## 2020-10-24 DIAGNOSIS — R55 Syncope and collapse: Secondary | ICD-10-CM

## 2020-10-24 DIAGNOSIS — J439 Emphysema, unspecified: Secondary | ICD-10-CM | POA: Diagnosis not present

## 2020-10-24 DIAGNOSIS — J9611 Chronic respiratory failure with hypoxia: Secondary | ICD-10-CM | POA: Diagnosis not present

## 2020-10-24 DIAGNOSIS — K219 Gastro-esophageal reflux disease without esophagitis: Secondary | ICD-10-CM

## 2020-10-24 DIAGNOSIS — Z8546 Personal history of malignant neoplasm of prostate: Secondary | ICD-10-CM

## 2020-10-24 DIAGNOSIS — D62 Acute posthemorrhagic anemia: Secondary | ICD-10-CM | POA: Diagnosis not present

## 2020-10-24 DIAGNOSIS — N400 Enlarged prostate without lower urinary tract symptoms: Secondary | ICD-10-CM | POA: Diagnosis not present

## 2020-10-24 DIAGNOSIS — R2681 Unsteadiness on feet: Secondary | ICD-10-CM | POA: Diagnosis not present

## 2020-10-24 NOTE — Progress Notes (Signed)
Location:  [place] Room Number: 155/P Place of Service:  SNF (31)   CODE STATUS: Full Code  Allergies  Allergen Reactions  . Morphine     REACTION: sweats    Chief Complaint  Patient presents with  . Hospitalization Follow-up    HPI:  He is a 87 year old man who has been hospitalized from 10-20-20 through 10-22-20. He had been recently hospitalized for a right hip fracture in Jan this year. He presented to the ED after suffering a fall at this facility. He does not remember the fall. He presented to the ED for possible syncope and collapse. His work up was negative which included ct of head c-spine; ct angiogram of chest and carotid dopplers. His 2-d echo did not demonstrate a cause for his collapse. His orthostatic vs did demonstrate tachycardia. It is felt that some dehydration played a role. He has had no further episodes of dizziness. He denies any pain; no chest pain; no new shortness of breath; no new cough. He states that he feels pretty good today and that his energy is good. He continues to be here for short term rehab with his goal to return back home. He continue to be followed for his chronic illnesses including: Closed right hip fracture with routine healing subsequent encounter: is stable  Aortic atherosclerosis  GOLD COPD 3/group d.    Past Medical History:  Diagnosis Date  . Allergic rhinitis   . Cataract    s/p removal  . Chronic respiratory failure (HCC)    oxygen 3L at home  . Emphysema   . Hypothyroid   . PNA (pneumonia)   . Prostate cancer [facility])     Past Surgical History:  Procedure Laterality Date  . ADENOIDECTOMY    . HIP ARTHROPLASTY Right 10/10/2020   Procedure: ARTHROPLASTY BIPOLAR HIP (HEMIARTHROPLASTY);  Surgeon: Mordecai Rasmussen, MD;  Location: [facility];  Service: Orthopedics;  Laterality: Right;  . ROTATOR CUFF REPAIR  1990,2009   bilateral  . Seed implant for prostate cancer  2000  . TONSILLECTOMY    . TRANSURETHRAL  RESECTION OF PROSTATE  2001   x2    Social History   Socioeconomic History  . Marital status: Married    Spouse name: Not on file  . Number of children: Not on file  . Years of education: Not on file  . Highest education level: Not on file  Occupational History  . Occupation: Chief Financial Officer  Tobacco Use  . Smoking status: Former Smoker    Packs/day: 1.50    Years: 50.00    Pack years: 75.00    Types: Cigarettes    Quit date: 09/17/2008    Years since quitting: 12.1  . Smokeless tobacco: Never Used  Vaping Use  . Vaping Use: Never used  Substance and Sexual Activity  . Alcohol use: Yes    Alcohol/week: 1.0 standard drink    Types: 1 Glasses of wine per week    Comment: each evening  . Drug use: No  . Sexual activity: Not on file  Other Topics Concern  . Not on file  Social History Narrative  . Not on file   Social Determinants of Health   Financial Resource Strain: Low Risk   . Difficulty of Paying Living Expenses: Not very hard  Food Insecurity: Not on file  Transportation Needs: Not on file  Physical Activity: Not on file  Stress: Not on file  Social Connections: Not on file  Intimate  Partner Violence: Not on file   Family History  Problem Relation Age of Onset  . Heart disease Mother   . Prostate cancer Father       VITAL SIGNS BP 131/74   Pulse 65   Temp 98.1 F (36.7 C)   Resp 20   Ht 5\' 6"  (1.676 m)   Wt 126 lb 9.6 oz (57.4 kg)   SpO2 96%   BMI 20.43 kg/m   Outpatient Encounter Medications as of 10/24/2020  Medication Sig  . acetaminophen (TYLENOL) 325 MG tablet Take 650 mg by mouth every 6 (six) hours.  Marland Kitchen aspirin EC 81 MG tablet Take 81 mg by mouth. Twice a day from 10/12/2020-11/22/2020 Then take once a day starting 11/23/2020  . Cholecalciferol 1.25 MG (50000 UT) capsule Take 50,000 Units by mouth once a week. On Wednesday  . Fluticasone-Umeclidin-Vilant (TRELEGY ELLIPTA) 100-62.5-25 MCG/INH AEPB Inhale 1 puff into the lungs daily.  Marland Kitchen guaiFENesin  (MUCINEX) 600 MG 12 hr tablet Take 600 mg by mouth 2 (two) times daily as needed for cough.  . levothyroxine (SYNTHROID) 75 MCG tablet TAKE 1 TABLET (75 MCG TOTAL) BY MOUTH DAILY.  Marland Kitchen LORazepam (ATIVAN) 0.5 MG tablet Take 1 tablet (0.5 mg total) by mouth 2 (two) times daily as needed for anxiety.  . mirtazapine (REMERON) 15 MG tablet TAKE 1/2 TABLET BY MOUTH IN THE EVENING  . NON FORMULARY Diet: _____ Regular, ___x___ NAS, _______Consistent Carbohydrate, _______NPO _____Other  . OXYGEN Inhale 4 L into the lungs continuous.  . pantoprazole (PROTONIX) 40 MG tablet Take 1 tablet (40 mg total) by mouth 2 (two) times daily.  . polyvinyl alcohol (LIQUIFILM TEARS) 1.4 % ophthalmic solution Place 1 drop into both eyes 3 (three) times daily as needed for dry eyes.  . predniSONE (DELTASONE) 10 MG tablet Take 1 tablet (10 mg total) by mouth daily with breakfast. HOLD while on Prednisone taper  . tamsulosin (FLOMAX) 0.4 MG CAPS capsule TAKE 1 CAPSULE BY MOUTH EVERY DAY  . zolpidem (AMBIEN) 5 MG tablet TAKE 1 TABLET BY MOUTH EVERY DAY AT BEDTIME AS NEEDED FOR SLEEP   No facility-administered encounter medications on file as of 10/24/2020.     SIGNIFICANT DIAGNOSTIC EXAMS   PREVIOUS   09-09-20: echo bubble  1. Left ventricular ejection fraction, by estimation, is 50 to 55%. The left ventricle has low normal function. The left ventricle has no regional wall motion abnormalities. There is moderate left ventricular hypertrophy. Left ventricular diastolic parameters are consistent with Grade I diastolic dysfunction (impaired relaxation   10-09-20: pelvic x-ray: Right femoral neck fracture.   10-09-20: chest x-ray: No active cardiopulmonary disease identified   TODAY  10-20-20 ct of head and cervical spine:  CT head: 1. Mildly motion degraded exam. 2. No evidence of acute intracranial abnormality. 3. Redemonstrated small chronic cortical infarcts within the bilateral frontal lobes, left parietal and  right occipital lobes. 4. Stable background mild-to-moderate cerebral atrophy and severe chronic small vessel ischemic disease. 5. Known small chronic infarcts within the bilateral cerebellar hemispheres. 6. Mild paranasal disease, as described. CT cervical spine: 1. Mildly motion degraded exam. 2. No evidence of acute fracture to the cervical spine. 3. Nonspecific reversal of the expected cervical lordosis. 4. Minimal C2-C3 grade 1 anterolisthesis and C5-C6 grade 1 retrolisthesis. 5. Cervical spondylosis as described with up to moderate appreciable spinal canal stenosis and multilevel bony neural foraminal narrowing. Degenerative fusion at C4-C5. 6. Biapical bullous emphysema   10-20-20 ct angio of chest:  1.  No acute intrathoracic pathology. No CT evidence of pulmonary artery embolus. 2. Severe emphysema with right upper lobe scarring and architectural distortion. 3. A 6 mm left lower lobe nodule. 4. Aortic Atherosclerosis  and Emphysema   10-21-20: carotid artery doppler: No hemodynamically significant stenosis of the internal carotid arteries.  10-22-20: 2-d echo:  1. Suboptimal acoustic windows for imaging, study is limited.   2. Left ventricular ejection fraction, by estimation, is 45 to 50%. The  left ventricle has mildly decreased function. Left ventricular endocardial  border not optimally defined to evaluate regional wall motion. There is  mild left ventricular hypertrophy. Left ventricular diastolic parameters are indeterminate    LABS REVIEWED PREVIOUS   09-09-20: hgb a1c 5.7; chol 270; ldl 140; trig 47; hdl 121 10-09-20: wbc 13.0; hgb 12.9; hct 40.7; mcv 94.7 plt 286; glucose 115; bun 25; creat 0.85; k+ 3.9; na++ 135; ca 9.2 GFR>60 10-12-20; wbc 11.0; hgb 11.9; hct 36.7; mcv 992.9 plt 297 glucose 119; bun 25; creat 0.76; k+ 4.1; na++ 134; ca 8.5 GFR >60  TODAY  10-20-20: wbc 11.1; hgb 11.0; hct 35.6; mcv 96.7 plt 444; glucose 137; bun 22; creat 1.06; k+ 5.3; na++ 133; ca 8.9  GFR>60; liver normal albumin 3.2  10-21-20: wbc 14.3; hgb 11.7; hct 37.7; mcv 96.4 plt 517; glucose 94; bun 16; creat 0.78; k+ 4.0; na++ 137; ca 9.5 GFR>60; liver normal albumin 3.4    Review of Systems  Constitutional: Negative for malaise/fatigue.  Respiratory: Negative for cough and shortness of breath.   Cardiovascular: Negative for chest pain, palpitations and leg swelling.  Gastrointestinal: Negative for abdominal pain, constipation and heartburn.  Musculoskeletal: Negative for back pain, joint pain and myalgias.  Skin: Negative.   Neurological: Negative for dizziness.  Psychiatric/Behavioral: The patient is not nervous/anxious.       Physical Exam Constitutional:      General: He is not in acute distress.    Appearance: He is well-developed and well-nourished. He is not diaphoretic.  Neck:     Thyroid: No thyromegaly.  Cardiovascular:     Rate and Rhythm: Normal rate and regular rhythm.     Pulses: Normal pulses and intact distal pulses.     Heart sounds: Normal heart sounds.  Pulmonary:     Effort: Pulmonary effort is normal. No respiratory distress.     Breath sounds: Normal breath sounds.     Comments: 02 Abdominal:     General: Bowel sounds are normal. There is no distension.     Palpations: Abdomen is soft.     Tenderness: There is no abdominal tenderness.  Musculoskeletal:        General: No edema.     Cervical back: Neck supple.     Right lower leg: No edema.     Left lower leg: No edema.     Comments:  Is able to move all extremities Right hip arthroplasty 10-10-20 for right femoral neck fracture.     Lymphadenopathy:     Cervical: No cervical adenopathy.  Skin:    General: Skin is warm and dry.     Comments: Facial bruising present.   Neurological:     Mental Status: He is alert and oriented to person, place, and time.  Psychiatric:        Mood and Affect: Mood and affect and mood normal.       ASSESSMENT/ PLAN:  TODAY   1. Closed right hip  fracture with routine healing subsequent encounter: is stable  Will continue therapy as directed and will follow up with orthopedics. Will continue asa 81 mg twice daily through 11-22-20 then daily; will continue tylenol 650 mg every 6 hours  2. Aortic atherosclerosis (cxr 02-17-20); will monitor   3. GOLD COPD 3/group d. Is 02 dependent; will continue mucinex 600 mg twice daily as needed trelegy 100-62.5-25 mcg 1 puff daily; prednisone 10 mg daily   4. Chronic respiratory failure with hypoxia: is stable is 02 dependent will monitor   5. GERD without esophagitis: is stable will continue protonix 40 mg twice daily   6.  Hypothyroidism unspecified type: is stable tsh 3.236 will continue synthroid 75 mcg daily   7. Moderate protein calorie malnutrition: is stable continues remeron 7.5 mg nightly for appetite.   8. Benign prostatic hyperplasia without lower urinary tract symptoms / history prostate cancer: is stable will continue flomax 0.4 mg daily   9. Acute blood loss anemia as cause of post operative anemia: is stable hgb 11.7 is mild will monitor   10. Vitamin D deficiency: D is 28.37 will continue vit D 50,000 units weekly   11.  Syncope and collapse: is stable will monitor    Will check cbc; bmp.    MD is aware of resident's narcotic use and is in agreement with current plan of care. We will attempt to wean resident as appropriate.  Ok Edwards NP [facility]  Contact [phone] Monday through Friday 8am- 5pm  After hours call [phone]

## 2020-10-25 ENCOUNTER — Encounter: Payer: Self-pay | Admitting: Orthopedic Surgery

## 2020-10-25 ENCOUNTER — Inpatient Hospital Stay: Payer: PPO

## 2020-10-25 ENCOUNTER — Ambulatory Visit (INDEPENDENT_AMBULATORY_CARE_PROVIDER_SITE_OTHER): Payer: PPO | Admitting: Orthopedic Surgery

## 2020-10-25 VITALS — BP 124/67 | HR 93 | Ht 65.0 in

## 2020-10-25 DIAGNOSIS — M25551 Pain in right hip: Secondary | ICD-10-CM

## 2020-10-25 DIAGNOSIS — S72001D Fracture of unspecified part of neck of right femur, subsequent encounter for closed fracture with routine healing: Secondary | ICD-10-CM

## 2020-10-25 NOTE — Progress Notes (Signed)
Orthopaedic Postop Note  Assessment: John Black is a 87 y.o. male s/p right hip hemiarthroplasty  DOS: 10/10/2020  Plan: Surgical incision evaluated, sutures were trimmed.  Steri-Strips were placed. Weightbearing as tolerated on the right lower extremity Posterior hip precautions for 3 months after surgery Continue with hip abduction pillow while in bed Recommend ambulation with a walker 2-3 times per day Follow-up 4 weeks   Follow-up: Return in about 4 weeks (around 11/22/2020). XR at next visit: AP pelvis, R hip   Subjective:  Chief Complaint  Patient presents with  . Hip Pain    History of Present Illness: John Black is a 87 y.o. male who presents following the above stated procedure.  He has recovered well following surgery.  However, he has had a couple of falls.  They had to admit him back to the hospital for skilled nursing.  His pain has improved.  His only taking Tylenol at this time.  No issues with the surgical incision.  He states that he has not been using the abduction pillow at night.  He is also a little disappointed because he is not been ambulating on a daily basis.  Review of Systems: No fevers or chills No numbness or tingling No Chest Pain  Objective: BP 124/67   Pulse 93   Ht 5\' 5"  (1.651 m)   BMI 21.07 kg/m   Physical Exam: Male, sitting in a wheelchair.  Right lateral hip incision is healing well without surrounding erythema or drainage.  He tolerates gentle range of motion of the right hip.  Active motion intact in the TA/EHL/GS.  He has some bilateral pitting edema around his ankles.  Sensation is intact in the right foot.  Toes are warm and well perfused.   IMAGING: I personally ordered and reviewed the following images:   X-rays of the pelvis as well as the right hip demonstrates the hip implant is in good position without interval subsidence.  There are no acute injuries noted.  Impression: Right hip hemiarthroplasty  in good position  Mordecai Rasmussen, MD 10/25/2020 10:58 AM

## 2020-10-27 ENCOUNTER — Encounter: Payer: Self-pay | Admitting: Adult Health

## 2020-10-27 ENCOUNTER — Non-Acute Institutional Stay (SKILLED_NURSING_FACILITY): Payer: PPO | Admitting: Adult Health

## 2020-10-27 ENCOUNTER — Other Ambulatory Visit (HOSPITAL_COMMUNITY)
Admission: RE | Admit: 2020-10-27 | Discharge: 2020-10-27 | Disposition: A | Discharge status: Home / Self Care | Payer: PPO | Source: Skilled Nursing Facility | Attending: Adult Health | Admitting: Adult Health

## 2020-10-27 DIAGNOSIS — S72001D Fracture of unspecified part of neck of right femur, subsequent encounter for closed fracture with routine healing: Secondary | ICD-10-CM | POA: Insufficient documentation

## 2020-10-27 DIAGNOSIS — D75839 Thrombocytosis, unspecified: Secondary | ICD-10-CM | POA: Diagnosis not present

## 2020-10-27 DIAGNOSIS — I700 Atherosclerosis of aorta: Secondary | ICD-10-CM | POA: Diagnosis not present

## 2020-10-27 DIAGNOSIS — J441 Chronic obstructive pulmonary disease with (acute) exacerbation: Secondary | ICD-10-CM

## 2020-10-27 LAB — BASIC METABOLIC PANEL
Anion gap: 8 (ref 5–15)
BUN: 23 mg/dL (ref 8–23)
CO2: 31 mmol/L (ref 22–32)
Calcium: 9.2 mg/dL (ref 8.9–10.3)
Chloride: 97 mmol/L — ABNORMAL LOW (ref 98–111)
Creatinine, Ser: 0.86 mg/dL (ref 0.61–1.24)
GFR, Estimated: >60 mL/min (ref >60)
Glucose, Bld: 87 mg/dL (ref 70–99)
Potassium: 3.9 mmol/L (ref 3.5–5.1)
Sodium: 136 mmol/L (ref 135–145)

## 2020-10-27 LAB — CBC
HCT: 37.4 % — ABNORMAL LOW (ref 39.0–52.0)
Hemoglobin: 11.6 g/dL — ABNORMAL LOW (ref 13.0–17.0)
MCH: 30 pg (ref 26.0–34.0)
MCHC: 31 g/dL (ref 30.0–36.0)
MCV: 96.6 fL (ref 80.0–100.0)
Platelets: 477 10*3/uL — ABNORMAL HIGH (ref 150–400)
RBC: 3.87 MIL/uL — ABNORMAL LOW (ref 4.22–5.81)
RDW: 14.6 % (ref 11.5–15.5)
WBC: 12.4 10*3/uL — ABNORMAL HIGH (ref 4.0–10.5)
nRBC: 0 % (ref 0.0–0.2)

## 2020-10-27 NOTE — Progress Notes (Signed)
Location:  [place] Room Number: 155/P Place of Service:  SNF (31)   CODE STATUS: Full Code   Allergies  Allergen Reactions  . Morphine Other (See Comments)    REACTION: sweats    Chief Complaint  Patient presents with  . Acute Visit    Care Plan Meeting     HPI:  We have come together for his care plan meeting. Family present.  BIMS 14; mood 12. He continues to require assistance with his adls is able to feed self. Falls since admission: #1 without injury; #2 with forehead gash with sutures. Is 02 dependent; no uncontrolled pain. Therapy:   Adls: requires supervision upper body; moderate assist with lower body. Does have shortness of breath poor tolerance to activity 5 minutes activity requires 5 minutes recovery. He ambulating short distance with walker; contact to minimal assist sit to stand: contact to very little help to stand. Using assistance devices with great difficulty. BRP requires contact guard. Has follow up orthopedics on 11-21-20.  His current weight is 126.6 pounds; appetite is fair 50-100%.  His family is concerned about his ativan; fearing he will get confused with this medication; and would like it stopped.   He continues to be followed for his chronic illnesses including: Closed fracture of right hip with routine healing subsequent encounter:   COPD with acute exacerbation Aortic atherosclerosis   Past Medical History:  Diagnosis Date  . Allergic rhinitis   . Cataract    s/p removal  . Chronic respiratory failure (HCC)    oxygen 3L at home  . Emphysema   . Hypothyroid   . PNA (pneumonia)   . Prostate cancer [facility])     Past Surgical History:  Procedure Laterality Date  . ADENOIDECTOMY    . HIP ARTHROPLASTY Right 10/10/2020   Procedure: ARTHROPLASTY BIPOLAR HIP (HEMIARTHROPLASTY);  Surgeon: Mordecai Rasmussen, MD;  Location: [facility];  Service: Orthopedics;  Laterality: Right;  . ROTATOR CUFF REPAIR  1990,2009   bilateral  . Seed implant  for prostate cancer  2000  . TONSILLECTOMY    . TRANSURETHRAL RESECTION OF PROSTATE  2001   x2    Social History   Socioeconomic History  . Marital status: Married    Spouse name: Not on file  . Number of children: Not on file  . Years of education: Not on file  . Highest education level: Not on file  Occupational History  . Occupation: Chief Financial Officer  Tobacco Use  . Smoking status: Former Smoker    Packs/day: 1.50    Years: 50.00    Pack years: 75.00    Types: Cigarettes    Quit date: 09/17/2008    Years since quitting: 12.1  . Smokeless tobacco: Never Used  Vaping Use  . Vaping Use: Never used  Substance and Sexual Activity  . Alcohol use: Yes    Alcohol/week: 1.0 standard drink    Types: 1 Glasses of wine per week    Comment: each evening  . Drug use: No  . Sexual activity: Not on file  Other Topics Concern  . Not on file  Social History Narrative  . Not on file   Social Determinants of Health   Financial Resource Strain: Low Risk   . Difficulty of Paying Living Expenses: Not very hard  Food Insecurity: Not on file  Transportation Needs: Not on file  Physical Activity: Not on file  Stress: Not on file  Social Connections: Not on file  Intimate  Partner Violence: Not on file   Family History  Problem Relation Age of Onset  . Heart disease Mother   . Prostate cancer Father       VITAL SIGNS BP 120/75   Pulse 75   Temp 98 F (36.7 C)   Resp 20   Ht 5\' 5"  (1.651 m)   Wt 126 lb 9.6 oz (57.4 kg)   SpO2 93%   BMI 21.07 kg/m   Outpatient Encounter Medications as of 10/27/2020  Medication Sig  . acetaminophen (TYLENOL) 325 MG tablet Take 650 mg by mouth every 6 (six) hours.  Marland Kitchen aspirin EC 81 MG tablet Take 81 mg by mouth. Twice a day from 10/12/2020-11/22/2020 Then take once a day starting 11/23/2020  . Cholecalciferol 1.25 MG (50000 UT) capsule Take 50,000 Units by mouth once a week. On Wednesday  . Fluticasone-Umeclidin-Vilant (TRELEGY ELLIPTA) 100-62.5-25  MCG/INH AEPB Inhale 1 puff into the lungs daily.  Marland Kitchen guaiFENesin (MUCINEX) 600 MG 12 hr tablet Take 600 mg by mouth 2 (two) times daily as needed for cough.  . levothyroxine (SYNTHROID) 75 MCG tablet TAKE 1 TABLET (75 MCG TOTAL) BY MOUTH DAILY.  Marland Kitchen LORazepam (ATIVAN) 0.5 MG tablet Take 1 tablet (0.5 mg total) by mouth 2 (two) times daily as needed for anxiety.  . mirtazapine (REMERON) 15 MG tablet TAKE 1/2 TABLET BY MOUTH IN THE EVENING  . NON FORMULARY Diet: _____ Regular, ___x___ NAS, _______Consistent Carbohydrate, _______NPO _____Other  . OXYGEN Inhale 4 L into the lungs continuous.  . pantoprazole (PROTONIX) 40 MG tablet Take 1 tablet (40 mg total) by mouth 2 (two) times daily.  . polyvinyl alcohol (LIQUIFILM TEARS) 1.4 % ophthalmic solution Place 1 drop into both eyes 3 (three) times daily as needed for dry eyes.  . predniSONE (DELTASONE) 10 MG tablet Take 1 tablet (10 mg total) by mouth daily with breakfast. HOLD while on Prednisone taper  . tamsulosin (FLOMAX) 0.4 MG CAPS capsule TAKE 1 CAPSULE BY MOUTH EVERY DAY  . zolpidem (AMBIEN) 5 MG tablet TAKE 1 TABLET BY MOUTH EVERY DAY AT BEDTIME AS NEEDED FOR SLEEP   No facility-administered encounter medications on file as of 10/27/2020.     SIGNIFICANT DIAGNOSTIC EXAMS  PREVIOUS   09-09-20: echo bubble  1. Left ventricular ejection fraction, by estimation, is 50 to 55%. The left ventricle has low normal function. The left ventricle has no regional wall motion abnormalities. There is moderate left ventricular hypertrophy. Left ventricular diastolic parameters are consistent with Grade I diastolic dysfunction (impaired relaxation   10-09-20: pelvic x-ray: Right femoral neck fracture.   10-09-20: chest x-ray: No active cardiopulmonary disease identified   10-20-20 ct of head and cervical spine:  CT head: 1. Mildly motion degraded exam. 2. No evidence of acute intracranial abnormality. 3. Redemonstrated small chronic cortical infarcts  within the bilateral frontal lobes, left parietal and right occipital lobes. 4. Stable background mild-to-moderate cerebral atrophy and severe chronic small vessel ischemic disease. 5. Known small chronic infarcts within the bilateral cerebellar hemispheres. 6. Mild paranasal disease, as described. CT cervical spine: 1. Mildly motion degraded exam. 2. No evidence of acute fracture to the cervical spine. 3. Nonspecific reversal of the expected cervical lordosis. 4. Minimal C2-C3 grade 1 anterolisthesis and C5-C6 grade 1 retrolisthesis. 5. Cervical spondylosis as described with up to moderate appreciable spinal canal stenosis and multilevel bony neural foraminal narrowing. Degenerative fusion at C4-C5. 6. Biapical bullous emphysema   10-20-20 ct angio of chest:  1. No acute intrathoracic  pathology. No CT evidence of pulmonary artery embolus. 2. Severe emphysema with right upper lobe scarring and architectural distortion. 3. A 6 mm left lower lobe nodule. 4. Aortic Atherosclerosis  and Emphysema   10-21-20: carotid artery doppler: No hemodynamically significant stenosis of the internal carotid arteries.  10-22-20: 2-d echo:  1. Suboptimal acoustic windows for imaging, study is limited.   2. Left ventricular ejection fraction, by estimation, is 45 to 50%. The  left ventricle has mildly decreased function. Left ventricular endocardial  border not optimally defined to evaluate regional wall motion. There is  mild left ventricular hypertrophy. Left ventricular diastolic parameters are indeterminate   NO NEW EXAMS.    LABS REVIEWED PREVIOUS   09-09-20: hgb a1c 5.7; chol 270; ldl 140; trig 47; hdl 121 10-09-20: wbc 13.0; hgb 12.9; hct 40.7; mcv 94.7 plt 286; glucose 115; bun 25; creat 0.85; k+ 3.9; na++ 135; ca 9.2 GFR>60 10-12-20; wbc 11.0; hgb 11.9; hct 36.7; mcv 992.9 plt 297 glucose 119; bun 25; creat 0.76; k+ 4.1; na++ 134; ca 8.5 GFR >60 10-20-20: wbc 11.1; hgb 11.0; hct 35.6; mcv 96.7 plt 444;  glucose 137; bun 22; creat 1.06; k+ 5.3; na++ 133; ca 8.9 GFR>60; liver normal albumin 3.2  10-21-20: wbc 14.3; hgb 11.7; hct 37.7; mcv 96.4 plt 517; glucose 94; bun 16; creat 0.78; k+ 4.0; na++ 137; ca 9.5 GFR>60; liver normal albumin 3.4    NO NEW LABS.   Review of Systems  Constitutional: Negative for malaise/fatigue.  Respiratory: Negative for cough and shortness of breath.   Cardiovascular: Negative for chest pain, palpitations and leg swelling.  Gastrointestinal: Negative for abdominal pain, constipation and heartburn.  Musculoskeletal: Negative for back pain, joint pain and myalgias.  Skin: Negative.   Neurological: Negative for dizziness.  Psychiatric/Behavioral: The patient is not nervous/anxious.       Physical Exam Constitutional:      General: He is not in acute distress.    Appearance: He is well-developed and well-nourished. He is not diaphoretic.  Neck:     Thyroid: No thyromegaly.  Cardiovascular:     Rate and Rhythm: Normal rate and regular rhythm.     Pulses: Normal pulses and intact distal pulses.     Heart sounds: Normal heart sounds.  Pulmonary:     Effort: Pulmonary effort is normal. No respiratory distress.     Breath sounds: Normal breath sounds.     Comments: 02 Abdominal:     General: Bowel sounds are normal. There is no distension.     Palpations: Abdomen is soft.     Tenderness: There is no abdominal tenderness.  Musculoskeletal:     Cervical back: Neck supple.     Right lower leg: Edema present.     Left lower leg: Edema present.     Comments:  Is able to move all extremities Right hip arthroplasty 10-10-20 for right femoral neck fracture.     1+ bilateral lower extremity edema.   Lymphadenopathy:     Cervical: No cervical adenopathy.  Skin:    General: Skin is warm and dry.  Neurological:     Mental Status: He is alert and oriented to person, place, and time.  Psychiatric:        Mood and Affect: Mood and affect and mood normal.       ASSESSMENT/ PLAN:  TODAY  1.  Closed fracture of right hip with routine healing subsequent encounter:   2. COPD with acute exacerbation 3. Aortic atherosclerosis  Will continue current plan of care  Will stop ativan.  Will continue therapy as directed  His goal remains to return home.    MD is aware of resident's narcotic use and is in agreement with current plan of care. We will attempt to wean resident as appropriate.  Ok Edwards NP [facility]  Contact [phone] Monday through Friday 8am- 5pm  After hours call [phone]

## 2020-10-28 ENCOUNTER — Non-Acute Institutional Stay (SKILLED_NURSING_FACILITY): Payer: PPO | Admitting: Internal Medicine

## 2020-10-28 ENCOUNTER — Encounter: Payer: Self-pay | Admitting: Internal Medicine

## 2020-10-28 ENCOUNTER — Other Ambulatory Visit (HOSPITAL_COMMUNITY)
Admission: RE | Admit: 2020-10-28 | Discharge: 2020-10-28 | Disposition: A | Discharge status: Home / Self Care | Payer: PPO | Source: Skilled Nursing Facility | Attending: Adult Health | Admitting: Adult Health

## 2020-10-28 DIAGNOSIS — I10 Essential (primary) hypertension: Secondary | ICD-10-CM | POA: Diagnosis not present

## 2020-10-28 DIAGNOSIS — R55 Syncope and collapse: Secondary | ICD-10-CM

## 2020-10-28 DIAGNOSIS — D62 Acute posthemorrhagic anemia: Secondary | ICD-10-CM

## 2020-10-28 DIAGNOSIS — E441 Mild protein-calorie malnutrition: Secondary | ICD-10-CM

## 2020-10-28 LAB — CBC
HCT: 36.2 % — ABNORMAL LOW (ref 39.0–52.0)
Hemoglobin: 11.2 g/dL — ABNORMAL LOW (ref 13.0–17.0)
MCH: 29.9 pg (ref 26.0–34.0)
MCHC: 30.9 g/dL (ref 30.0–36.0)
MCV: 96.8 fL (ref 80.0–100.0)
Platelets: 429 10*3/uL — ABNORMAL HIGH (ref 150–400)
RBC: 3.74 MIL/uL — ABNORMAL LOW (ref 4.22–5.81)
RDW: 14.5 % (ref 11.5–15.5)
WBC: 8.1 10*3/uL (ref 4.0–10.5)
nRBC: 0 % (ref 0.0–0.2)

## 2020-10-28 LAB — BASIC METABOLIC PANEL
Anion gap: 7 (ref 5–15)
BUN: 21 mg/dL (ref 8–23)
CO2: 32 mmol/L (ref 22–32)
Calcium: 9.2 mg/dL (ref 8.9–10.3)
Chloride: 97 mmol/L — ABNORMAL LOW (ref 98–111)
Creatinine, Ser: 0.86 mg/dL (ref 0.61–1.24)
GFR, Estimated: >60 mL/min (ref >60)
Glucose, Bld: 88 mg/dL (ref 70–99)
Potassium: 4.3 mmol/L (ref 3.5–5.1)
Sodium: 136 mmol/L (ref 135–145)

## 2020-10-28 NOTE — Progress Notes (Signed)
NURSING HOME LOCATION: [place] NUMBER:155/P    CODE STATUS: Full Code   PCP: Alycia Rossetti, MD   This is a readmission note to [facility] performed on this date less than 30 days from date of that readmission. Included are preadmission medical/surgical history; reconciled medication list; family history; social history and comprehensive review of systems.  Corrections and additions to the records were documented. Comprehensive physical exam was also performed. Additionally a clinical summary was entered for each active diagnosis pertinent to this admission in the Problem List to enhance continuity of care.  HPI: Patient was readmitted 2/3-10/22/2020 for possible syncopal event and fall at the SNF.  ED work-up included CT of the head, CT C-spine, CT angiogram of the chest, carotid Dopplers, and echocardiogram; all were nondiagnostic.  Mild orthostasis was present with tachycardia upon standing, attributed to some degree of dehydration.  IV fluids were administered.  Nutritional supplementation was provided for mild protein caloric malnutrition.  He also was found to have anemia in the context of his recent orthopedic procedure for hip fracture sustained in a mechanical fall.  FOBT was positive without evidence of gross rectal bleeding.  GI evaluation was deferred until stabilization post rehab at [facility].  Past medical and surgical history: Includes past medical history of prostate cancer, hypothyroidism, emphysema, and O2 dependent chronic respiratory failure. Surgeries and procedures include THA, prostate seed implant, and TURP.  Social history: Social drinker; 75-pack-year smoker.  Family history: Noncontributory due to advanced age.   Review of systems: He could not describe the circumstances of the fall.  He denies any cardiac or neurologic prodrome.  His only complaint is of peripheral edema.  Constitutional: No fever, significant weight change, fatigue  Eyes:  No redness, discharge, pain, vision change ENT/mouth: No nasal congestion, purulent discharge, earache, change in hearing, sore throat  Cardiovascular: No chest pain, palpitations, paroxysmal nocturnal dyspnea, claudication  Respiratory: No cough, sputum production, hemoptysis,  significant snoring, apnea  Gastrointestinal: No heartburn, dysphagia, abdominal pain, nausea /vomiting, rectal bleeding, melena, change in bowels Genitourinary: No dysuria, hematuria, pyuria, incontinence, nocturia Musculoskeletal: No joint stiffness, joint swelling Dermatologic: No rash, pruritus, change in appearance of skin Neurologic: No dizziness, headache,  seizures, numbness, tingling Psychiatric: No significant anxiety, depression, insomnia, anorexia Endocrine: No change in hair/skin/nails, excessive thirst, excessive hunger, excessive urination  Hematologic/lymphatic: No significant bruising, lymphadenopathy, abnormal bleeding Allergy/immunology: No itchy/watery eyes, significant sneezing, urticaria, angioedema  Physical exam:  Pertinent or positive findings: He appears chronically ill.  He has pattern alopecia and hair is disheveled.  Temporal wasting is present.  There is eschar above the left eyebrow laterally.  Bilateral ptosis is present.  He has dense arcus senilis.  He is edentulous.  Contact dermatitis is present below the nose where the nasal prongs touch the skin. Dry rales are noted anteriorly.  Heart sounds are markedly distant.  Pedal pulses are not palpable.  He is wearing compression hose but palpable edema is present.  Present is  faint ecchymosis in the left malar area.  He has extensive bruising and scarring over the forearms.  General appearance:  no acute distress, increased work of breathing is present.   Lymphatic: No lymphadenopathy about the head, neck, axilla. Eyes: No conjunctival inflammation or lid edema is present. There is no scleral icterus. Ears:  External ear exam shows no  significant lesions or deformities.   Nose:  External nasal examination shows no deformity or inflammation. Nasal mucosa are pink and moist  without lesions, exudates Oral exam: Lips and gums are healthy appearing.There is no oropharyngeal erythema or exudate. Neck:  No thyromegaly, masses, tenderness noted.    Heart:  No gallop, murmur, click, rub.  Lungs:  without wheezes, rhonchi, rubs. Abdomen: Bowel sounds are normal.  Abdomen is soft and nontender with no organomegaly, hernias, masses. GU: Deferred  Extremities:  No cyanosis, clubbing. Neurologic exam:Balance, Rhomberg, finger to nose testing could not be completed due to clinical state Skin: Warm & dry w/o tenting. No significant  rash.  See clinical summary under each active problem in the Problem List with associated updated therapeutic plan

## 2020-10-28 NOTE — Assessment & Plan Note (Addendum)
Current albumin 3.4 and total protein 6.5.  Despite these low normal or minimally reduced value; he exhibits diffuse muscle wasting of the temples and extremities.  The peripheral edema is likely related to the protein caloric malnutrition as history & exam do not suggest cardiac etiology of the edema.

## 2020-10-28 NOTE — Patient Instructions (Signed)
See assessment and plan under each diagnosis in the problem list and acutely for this visit 

## 2020-10-28 NOTE — Assessment & Plan Note (Addendum)
Isometric exercises discussed  To perform 4- 5 times prior to standing if you've been seated or supine for a period of time.

## 2020-10-28 NOTE — Assessment & Plan Note (Addendum)
Slight progression of anemia with H/H of 11.2/36.2 with normocytic, normochromic indices.  FOBT positive without gross GI bleeding.  CBC will be monitored with consideration of GI evaluation post rehab at [facility].

## 2020-10-31 ENCOUNTER — Encounter: Payer: Self-pay | Admitting: Adult Health

## 2020-10-31 ENCOUNTER — Non-Acute Institutional Stay (SKILLED_NURSING_FACILITY): Payer: PPO | Admitting: Adult Health

## 2020-10-31 DIAGNOSIS — I700 Atherosclerosis of aorta: Secondary | ICD-10-CM | POA: Diagnosis not present

## 2020-10-31 DIAGNOSIS — J449 Chronic obstructive pulmonary disease, unspecified: Secondary | ICD-10-CM | POA: Diagnosis not present

## 2020-10-31 DIAGNOSIS — S72001D Fracture of unspecified part of neck of right femur, subsequent encounter for closed fracture with routine healing: Secondary | ICD-10-CM | POA: Diagnosis not present

## 2020-10-31 NOTE — Progress Notes (Signed)
Location:  [place] Room Number: 155/P Place of Service:  SNF (31)   CODE STATUS: Full Code  Allergies  Allergen Reactions  . Morphine Other (See Comments)    REACTION: sweats    Chief Complaint  Patient presents with  . Short Term Rehab (STR)           Closed right hip fracture with routine healing subsequent encounter:Aortic atherosclerosis  GOLD COPD3/group d:  Weekly follow up for the first 30 days post hospitalization.       HPI:  He is a 87 year old short term rehab patient being seen for the management of his chronic illnesses: Closed right hip fracture with routine healing subsequent encounter:Aortic atherosclerosis  GOLD COPD3/group d:. He continues to participate in therapy. He denies any worsening shortness of breath or coughing. He denies any uncontrolled pain; no changes in appetite; no constipation.   Past Medical History:  Diagnosis Date  . Allergic rhinitis   . Cataract    s/p removal  . Chronic respiratory failure (HCC)    oxygen 3L at home  . Emphysema   . Hypothyroid   . PNA (pneumonia)   . Prostate cancer [facility])     Past Surgical History:  Procedure Laterality Date  . ADENOIDECTOMY    . HIP ARTHROPLASTY Right 10/10/2020   Procedure: ARTHROPLASTY BIPOLAR HIP (HEMIARTHROPLASTY);  Surgeon: Mordecai Rasmussen, MD;  Location: [facility]S;  Service: Orthopedics;  Laterality: Right;  . ROTATOR CUFF REPAIR  1990,2009   bilateral  . Seed implant for prostate cancer  2000  . TONSILLECTOMY    . TRANSURETHRAL RESECTION OF PROSTATE  2001   x2    Social History   Socioeconomic History  . Marital status: Married    Spouse name: Not on file  . Number of children: Not on file  . Years of education: Not on file  . Highest education level: Not on file  Occupational History  . Occupation: Chief Financial Officer  Tobacco Use  . Smoking status: Former Smoker    Packs/day: 1.50    Years: 50.00    Pack years: 75.00    Types: Cigarettes    Quit date:  09/17/2008    Years since quitting: 12.1  . Smokeless tobacco: Never Used  Vaping Use  . Vaping Use: Never used  Substance and Sexual Activity  . Alcohol use: Yes    Alcohol/week: 1.0 standard drink    Types: 1 Glasses of wine per week    Comment: each evening  . Drug use: No  . Sexual activity: Not on file  Other Topics Concern  . Not on file  Social History Narrative  . Not on file   Social Determinants of Health   Financial Resource Strain: Low Risk   . Difficulty of Paying Living Expenses: Not very hard  Food Insecurity: Not on file  Transportation Needs: Not on file  Physical Activity: Not on file  Stress: Not on file  Social Connections: Not on file  Intimate Partner Violence: Not on file   Family History  Problem Relation Age of Onset  . Heart disease Mother   . Prostate cancer Father       VITAL SIGNS BP 138/78   Pulse 84   Temp 98.1 F (36.7 C)   Resp 20   Ht 5\' 5"  (1.651 m)   Wt 126 lb 9.6 oz (57.4 kg)   SpO2 91%   BMI 21.07 kg/m   Outpatient Encounter Medications as of  10/31/2020  Medication Sig  . acetaminophen (TYLENOL) 325 MG tablet Take 650 mg by mouth every 6 (six) hours.  Marland Kitchen aspirin EC 81 MG tablet Take 81 mg by mouth. Twice a day from 10/12/2020-11/22/2020 Then take once a day starting 11/23/2020  . Cholecalciferol 1.25 MG (50000 UT) capsule Take 50,000 Units by mouth once a week. On Wednesday  . Ensure (ENSURE) Take 237 mLs by mouth daily.  . Fluticasone-Umeclidin-Vilant (TRELEGY ELLIPTA) 100-62.5-25 MCG/INH AEPB Inhale 1 puff into the lungs daily.  Marland Kitchen guaiFENesin (MUCINEX) 600 MG 12 hr tablet Take 600 mg by mouth 2 (two) times daily as needed for cough.  . levothyroxine (SYNTHROID) 75 MCG tablet TAKE 1 TABLET (75 MCG TOTAL) BY MOUTH DAILY.  . mirtazapine (REMERON) 15 MG tablet TAKE 1/2 TABLET BY MOUTH IN THE EVENING  . NON FORMULARY Diet: _____ Regular, ___x___ NAS, _______Consistent Carbohydrate, _______NPO _____Other  . OXYGEN Inhale 4 L into  the lungs continuous.  . pantoprazole (PROTONIX) 40 MG tablet Take 1 tablet (40 mg total) by mouth 2 (two) times daily.  . polyvinyl alcohol (LIQUIFILM TEARS) 1.4 % ophthalmic solution Place 1 drop into both eyes 3 (three) times daily as needed for dry eyes.  . predniSONE (DELTASONE) 10 MG tablet Take 1 tablet (10 mg total) by mouth daily with breakfast. HOLD while on Prednisone taper  . tamsulosin (FLOMAX) 0.4 MG CAPS capsule TAKE 1 CAPSULE BY MOUTH EVERY DAY  . zolpidem (AMBIEN) 5 MG tablet TAKE 1 TABLET BY MOUTH EVERY DAY AT BEDTIME AS NEEDED FOR SLEEP   No facility-administered encounter medications on file as of 10/31/2020.     SIGNIFICANT DIAGNOSTIC EXAMS   PREVIOUS   09-09-20: echo bubble  1. Left ventricular ejection fraction, by estimation, is 50 to 55%. The left ventricle has low normal function. The left ventricle has no regional wall motion abnormalities. There is moderate left ventricular hypertrophy. Left ventricular diastolic parameters are consistent with Grade I diastolic dysfunction (impaired relaxation   10-09-20: pelvic x-ray: Right femoral neck fracture.   10-09-20: chest x-ray: No active cardiopulmonary disease identified   10-20-20 ct of head and cervical spine:  CT head: 1. Mildly motion degraded exam. 2. No evidence of acute intracranial abnormality. 3. Redemonstrated small chronic cortical infarcts within the bilateral frontal lobes, left parietal and right occipital lobes. 4. Stable background mild-to-moderate cerebral atrophy and severe chronic small vessel ischemic disease. 5. Known small chronic infarcts within the bilateral cerebellar hemispheres. 6. Mild paranasal disease, as described. CT cervical spine: 1. Mildly motion degraded exam. 2. No evidence of acute fracture to the cervical spine. 3. Nonspecific reversal of the expected cervical lordosis. 4. Minimal C2-C3 grade 1 anterolisthesis and C5-C6 grade 1 retrolisthesis. 5. Cervical spondylosis as  described with up to moderate appreciable spinal canal stenosis and multilevel bony neural foraminal narrowing. Degenerative fusion at C4-C5. 6. Biapical bullous emphysema   10-20-20 ct angio of chest:  1. No acute intrathoracic pathology. No CT evidence of pulmonary artery embolus. 2. Severe emphysema with right upper lobe scarring and architectural distortion. 3. A 6 mm left lower lobe nodule. 4. Aortic Atherosclerosis  and Emphysema   10-21-20: carotid artery doppler: No hemodynamically significant stenosis of the internal carotid arteries.  10-22-20: 2-d echo:  1. Suboptimal acoustic windows for imaging, study is limited.   2. Left ventricular ejection fraction, by estimation, is 45 to 50%. The  left ventricle has mildly decreased function. Left ventricular endocardial  border not optimally defined to evaluate regional wall  motion. There is  mild left ventricular hypertrophy. Left ventricular diastolic parameters are indeterminate   NO NEW EXAMS.    LABS REVIEWED PREVIOUS   09-09-20: hgb a1c 5.7; chol 270; ldl 140; trig 47; hdl 121 10-09-20: wbc 13.0; hgb 12.9; hct 40.7; mcv 94.7 plt 286; glucose 115; bun 25; creat 0.85; k+ 3.9; na++ 135; ca 9.2 GFR>60 10-12-20; wbc 11.0; hgb 11.9; hct 36.7; mcv 992.9 plt 297 glucose 119; bun 25; creat 0.76; k+ 4.1; na++ 134; ca 8.5 GFR >60 10-20-20: wbc 11.1; hgb 11.0; hct 35.6; mcv 96.7 plt 444; glucose 137; bun 22; creat 1.06; k+ 5.3; na++ 133; ca 8.9 GFR>60; liver normal albumin 3.2  10-21-20: wbc 14.3; hgb 11.7; hct 37.7; mcv 96.4 plt 517; glucose 94; bun 16; creat 0.78; k+ 4.0; na++ 137; ca 9.5 GFR>60; liver normal albumin 3.4   TODAY;   10-27-20: wbc 12.4; hgb 11.6; hct 37.4; mcv 96.6 plt 477; glucose 87; bun 23; creat 0.86; k+ 3.; na++ 136; ca 9.2; GFR>60 10-28-20: wbc 8.1; hgb 11.2; hct 36.2; mcv 96.8 plt 429; glucose 88; bun 21; creat 0.86; k+ 4.3; na++ 136; ca 9.2; GFR>60    Review of Systems  Constitutional: Negative for malaise/fatigue.   Respiratory: Negative for cough and shortness of breath.   Cardiovascular: Negative for chest pain, palpitations and leg swelling.  Gastrointestinal: Negative for abdominal pain, constipation and heartburn.  Musculoskeletal: Negative for back pain, joint pain and myalgias.  Skin: Negative.   Neurological: Negative for dizziness.  Psychiatric/Behavioral: The patient is not nervous/anxious.       Physical Exam Constitutional:      General: He is not in acute distress.    Appearance: He is well-developed and well-nourished. He is not diaphoretic.  Neck:     Thyroid: No thyromegaly.  Cardiovascular:     Rate and Rhythm: Normal rate and regular rhythm.     Pulses: Normal pulses and intact distal pulses.     Heart sounds: Normal heart sounds.  Pulmonary:     Effort: Pulmonary effort is normal. No respiratory distress.     Breath sounds: Normal breath sounds.     Comments: 02 Abdominal:     General: Bowel sounds are normal. There is no distension.     Palpations: Abdomen is soft.     Tenderness: There is no abdominal tenderness.  Musculoskeletal:     Cervical back: Neck supple.     Right lower leg: Edema present.     Left lower leg: Edema present.     Comments:  Is able to move all extremities Right hip arthroplasty 10-10-20 for right femoral neck fracture.     1+ bilateral lower extremity edema  Lymphadenopathy:     Cervical: No cervical adenopathy.  Skin:    General: Skin is warm and dry.  Neurological:     Mental Status: He is alert and oriented to person, place, and time.  Psychiatric:        Mood and Affect: Mood and affect and mood normal.     ASSESSMENT/ PLAN:  TODAY   1. Closed right hip fracture with routine healing subsequent encounter: is stable will continue therapy as directed and will follow up with orthopedics. Will continue asa 81 mg twice daily through 11-22-20 then daily; will continue tylenol 650 mg every 6 hours.   2. Aortic atherosclerosis (cxr 02-17-20);  will monitor   3. GOLD COPD3/group d: is 02 dependent; will continue trelegy 100-62.5-25 mcg 1 puff daily; prednisone 10 mg  daily mucinex 600 mg twice daily as needed.   PREVIOUS   4. Chronic respiratory failure with hypoxia: is stable is 02 dependent will monitor   5. GERD without esophagitis: is stable will continue protonix 40 mg twice daily   6.  Hypothyroidism unspecified type: is stable tsh 3.236 will continue synthroid 75 mcg daily   7. Moderate protein calorie malnutrition: is stable continues remeron 7.5 mg nightly for appetite.   8. Benign prostatic hyperplasia without lower urinary tract symptoms / history prostate cancer: is stable will continue flomax 0.4 mg daily   9. Acute blood loss anemia as cause of post operative anemia: is stable hgb 11.7 is mild will monitor   10. Vitamin D deficiency: D is 28.37 will continue vit D 50,000 units weekly   11.  Syncope and collapse: is stable will monitor         MD is aware of resident's narcotic use and is in agreement with current plan of care. We will attempt to wean resident as appropriate.  Ok Edwards NP [facility]  Contact [phone] Monday through Friday 8am- 5pm  After hours call [phone]

## 2020-11-07 ENCOUNTER — Other Ambulatory Visit (HOSPITAL_COMMUNITY)
Admission: RE | Admit: 2020-11-07 | Discharge: 2020-11-07 | Disposition: A | Discharge status: Home / Self Care | Payer: PPO | Source: Skilled Nursing Facility | Attending: Internal Medicine | Admitting: Internal Medicine

## 2020-11-07 ENCOUNTER — Telehealth: Payer: Self-pay | Admitting: Family

## 2020-11-07 ENCOUNTER — Non-Acute Institutional Stay (SKILLED_NURSING_FACILITY): Payer: PPO | Admitting: Adult Health

## 2020-11-07 ENCOUNTER — Encounter: Payer: Self-pay | Admitting: Adult Health

## 2020-11-07 DIAGNOSIS — K219 Gastro-esophageal reflux disease without esophagitis: Secondary | ICD-10-CM | POA: Diagnosis not present

## 2020-11-07 DIAGNOSIS — J9611 Chronic respiratory failure with hypoxia: Secondary | ICD-10-CM

## 2020-11-07 DIAGNOSIS — M75101 Unspecified rotator cuff tear or rupture of right shoulder, not specified as traumatic: Secondary | ICD-10-CM

## 2020-11-07 DIAGNOSIS — M75102 Unspecified rotator cuff tear or rupture of left shoulder, not specified as traumatic: Secondary | ICD-10-CM | POA: Diagnosis not present

## 2020-11-07 LAB — BASIC METABOLIC PANEL
Anion gap: 11 (ref 5–15)
BUN: 37 mg/dL — ABNORMAL HIGH (ref 8–23)
CO2: 31 mmol/L (ref 22–32)
Calcium: 9.4 mg/dL (ref 8.9–10.3)
Chloride: 97 mmol/L — ABNORMAL LOW (ref 98–111)
Creatinine, Ser: 0.95 mg/dL (ref 0.61–1.24)
GFR, Estimated: >60 mL/min (ref >60)
Glucose, Bld: 88 mg/dL (ref 70–99)
Potassium: 3.7 mmol/L (ref 3.5–5.1)
Sodium: 139 mmol/L (ref 135–145)

## 2020-11-07 NOTE — Telephone Encounter (Signed)
On call late entry 11/06/2020:  Facility Nurse called stated patient's Ambien 5 mg tablet PRN orders had expired after 14 days.States patient very upset since he has been taking Ambien for many years.Amien reorder for 14 days.Please follow up.  Thank You.

## 2020-11-07 NOTE — Progress Notes (Signed)
Location:  [place] Room Number: 155/P Place of Service:  SNF (31)   CODE STATUS: Full Code  Allergies  Allergen Reactions  . Morphine Other (See Comments)    REACTION: sweats    Chief Complaint  Patient presents with  . ShortTerm Rehab (STR)             Bilateral rotator cuff:    Chronic respiratory failure with hypoxia  GERD without esophagitis:  Weekly follow up for the first 30 days post hospitalization.     HPI:  He is a 87 year old short term rehab patient being seen for the management of his chronic illnesses:Bilateral rotator cuff:    Chronic respiratory failure with hypoxia  GERD without esophagitis. He does have bilateral shoulder pain. He does participate in therapy as indicated. He denies any heart burn; no cough or shortness of breath.     Past Medical History:  Diagnosis Date  . Allergic rhinitis   . Cataract    s/p removal  . Chronic respiratory failure (HCC)    oxygen 3L at home  . Emphysema   . Hypothyroid   . PNA (pneumonia)   . Prostate cancer [facility])     Past Surgical History:  Procedure Laterality Date  . ADENOIDECTOMY    . HIP ARTHROPLASTY Right 10/10/2020   Procedure: ARTHROPLASTY BIPOLAR HIP (HEMIARTHROPLASTY);  Surgeon: Mordecai Rasmussen, MD;  Location: [facility]S;  Service: Orthopedics;  Laterality: Right;  . ROTATOR CUFF REPAIR  1990,2009   bilateral  . Seed implant for prostate cancer  2000  . TONSILLECTOMY    . TRANSURETHRAL RESECTION OF PROSTATE  2001   x2    Social History   Socioeconomic History  . Marital status: Married    Spouse name: Not on file  . Number of children: Not on file  . Years of education: Not on file  . Highest education level: Not on file  Occupational History  . Occupation: Chief Financial Officer  Tobacco Use  . Smoking status: Former Smoker    Packs/day: 1.50    Years: 50.00    Pack years: 75.00    Types: Cigarettes    Quit date: 09/17/2008    Years since quitting: 12.1  . Smokeless tobacco: Never  Used  Vaping Use  . Vaping Use: Never used  Substance and Sexual Activity  . Alcohol use: Yes    Alcohol/week: 1.0 standard drink    Types: 1 Glasses of wine per week    Comment: each evening  . Drug use: No  . Sexual activity: Not on file  Other Topics Concern  . Not on file  Social History Narrative  . Not on file   Social Determinants of Health   Financial Resource Strain: Low Risk   . Difficulty of Paying Living Expenses: Not very hard  Food Insecurity: Not on file  Transportation Needs: Not on file  Physical Activity: Not on file  Stress: Not on file  Social Connections: Not on file  Intimate Partner Violence: Not on file   Family History  Problem Relation Age of Onset  . Heart disease Mother   . Prostate cancer Father       VITAL SIGNS BP 112/63   Pulse 84   Temp 98.1 F (36.7 C)   Resp 20   Ht 5\' 5"  (1.651 m)   Wt 120 lb 9.6 oz (54.7 kg)   SpO2 96%   BMI 20.07 kg/m   Outpatient Encounter Medications as of  11/07/2020  Medication Sig  . acetaminophen (TYLENOL) 650 MG CR tablet Take 650 mg by mouth 3 (three) times daily.  Marland Kitchen aspirin EC 81 MG tablet Take 81 mg by mouth. Twice a day from 10/12/2020-11/22/2020 Then take once a day starting 11/23/2020  . Cholecalciferol 1.25 MG (50000 UT) capsule Take 50,000 Units by mouth once a week. On Wednesday  . Ensure (ENSURE) Take 237 mLs by mouth daily.  . Fluticasone-Umeclidin-Vilant (TRELEGY ELLIPTA) 100-62.5-25 MCG/INH AEPB Inhale 1 puff into the lungs daily.  . furosemide (LASIX) 20 MG tablet Take 20 mg by mouth daily.  Marland Kitchen guaiFENesin (MUCINEX) 600 MG 12 hr tablet Take 600 mg by mouth 2 (two) times daily as needed for cough.  . levothyroxine (SYNTHROID) 75 MCG tablet TAKE 1 TABLET (75 MCG TOTAL) BY MOUTH DAILY.  . mirtazapine (REMERON) 15 MG tablet TAKE 1/2 TABLET BY MOUTH IN THE EVENING  . NON FORMULARY Diet: _____ Regular, ___x___ NAS, _______Consistent Carbohydrate, _______NPO _____Other  . OXYGEN Inhale 4 L into  the lungs continuous.  . pantoprazole (PROTONIX) 40 MG tablet Take 1 tablet (40 mg total) by mouth 2 (two) times daily.  . polyvinyl alcohol (LIQUIFILM TEARS) 1.4 % ophthalmic solution Place 1 drop into both eyes 3 (three) times daily as needed for dry eyes.  . predniSONE (DELTASONE) 10 MG tablet Take 1 tablet (10 mg total) by mouth daily with breakfast. HOLD while on Prednisone taper  . tamsulosin (FLOMAX) 0.4 MG CAPS capsule TAKE 1 CAPSULE BY MOUTH EVERY DAY  . zolpidem (AMBIEN) 5 MG tablet Take 5 mg by mouth at bedtime.  . [DISCONTINUED] acetaminophen (TYLENOL) 325 MG tablet Take 650 mg by mouth every 6 (six) hours.  . [DISCONTINUED] zolpidem (AMBIEN) 5 MG tablet TAKE 1 TABLET BY MOUTH EVERY DAY AT BEDTIME AS NEEDED FOR SLEEP   No facility-administered encounter medications on file as of 11/07/2020.     SIGNIFICANT DIAGNOSTIC EXAMS   PREVIOUS   09-09-20: echo bubble  1. Left ventricular ejection fraction, by estimation, is 50 to 55%. The left ventricle has low normal function. The left ventricle has no regional wall motion abnormalities. There is moderate left ventricular hypertrophy. Left ventricular diastolic parameters are consistent with Grade I diastolic dysfunction (impaired relaxation   10-09-20: pelvic x-ray: Right femoral neck fracture.   10-09-20: chest x-ray: No active cardiopulmonary disease identified   10-20-20 ct of head and cervical spine:  CT head: 1. Mildly motion degraded exam. 2. No evidence of acute intracranial abnormality. 3. Redemonstrated small chronic cortical infarcts within the bilateral frontal lobes, left parietal and right occipital lobes. 4. Stable background mild-to-moderate cerebral atrophy and severe chronic small vessel ischemic disease. 5. Known small chronic infarcts within the bilateral cerebellar hemispheres. 6. Mild paranasal disease, as described. CT cervical spine: 1. Mildly motion degraded exam. 2. No evidence of acute fracture to the  cervical spine. 3. Nonspecific reversal of the expected cervical lordosis. 4. Minimal C2-C3 grade 1 anterolisthesis and C5-C6 grade 1 retrolisthesis. 5. Cervical spondylosis as described with up to moderate appreciable spinal canal stenosis and multilevel bony neural foraminal narrowing. Degenerative fusion at C4-C5. 6. Biapical bullous emphysema   10-20-20 ct angio of chest:  1. No acute intrathoracic pathology. No CT evidence of pulmonary artery embolus. 2. Severe emphysema with right upper lobe scarring and architectural distortion. 3. A 6 mm left lower lobe nodule. 4. Aortic Atherosclerosis  and Emphysema   10-21-20: carotid artery doppler: No hemodynamically significant stenosis of the internal carotid arteries.  10-22-20: 2-d echo:  1. Suboptimal acoustic windows for imaging, study is limited.   2. Left ventricular ejection fraction, by estimation, is 45 to 50%. The  left ventricle has mildly decreased function. Left ventricular endocardial  border not optimally defined to evaluate regional wall motion. There is  mild left ventricular hypertrophy. Left ventricular diastolic parameters are indeterminate   NO NEW EXAMS.    LABS REVIEWED PREVIOUS   09-09-20: hgb a1c 5.7; chol 270; ldl 140; trig 47; hdl 121 10-09-20: wbc 13.0; hgb 12.9; hct 40.7; mcv 94.7 plt 286; glucose 115; bun 25; creat 0.85; k+ 3.9; na++ 135; ca 9.2 GFR>60 10-12-20; wbc 11.0; hgb 11.9; hct 36.7; mcv 992.9 plt 297 glucose 119; bun 25; creat 0.76; k+ 4.1; na++ 134; ca 8.5 GFR >60 10-20-20: wbc 11.1; hgb 11.0; hct 35.6; mcv 96.7 plt 444; glucose 137; bun 22; creat 1.06; k+ 5.3; na++ 133; ca 8.9 GFR>60; liver normal albumin 3.2  10-21-20: wbc 14.3; hgb 11.7; hct 37.7; mcv 96.4 plt 517; glucose 94; bun 16; creat 0.78; k+ 4.0; na++ 137; ca 9.5 GFR>60; liver normal albumin 3.4  10-27-20: wbc 12.4; hgb 11.6; hct 37.4; mcv 96.6 plt 477; glucose 87; bun 23; creat 0.86; k+ 3.; na++ 136; ca 9.2; GFR>60 10-28-20: wbc 8.1; hgb 11.2; hct  36.2; mcv 96.8 plt 429; glucose 88; bun 21; creat 0.86; k+ 4.3; na++ 136; ca 9.2; GFR>60  TODAY  11-07-20: glucose 88; bun 37; creat 0.95; k+ 3.7; na++ 139; ca 9.4 GFR>60    Review of Systems  Constitutional: Negative for malaise/fatigue.  Respiratory: Negative for cough and shortness of breath.   Cardiovascular: Negative for chest pain, palpitations and leg swelling.  Gastrointestinal: Negative for abdominal pain, constipation and heartburn.  Musculoskeletal: Positive for joint pain. Negative for back pain and myalgias.       Bilateral shoulder pain   Skin: Negative.   Neurological: Negative for dizziness.  Psychiatric/Behavioral: The patient is not nervous/anxious.     Physical Exam Constitutional:      General: He is not in acute distress.    Appearance: He is well-developed and well-nourished. He is not diaphoretic.  Neck:     Thyroid: No thyromegaly.  Cardiovascular:     Rate and Rhythm: Normal rate and regular rhythm.     Pulses: Intact distal pulses.     Heart sounds: Normal heart sounds.  Pulmonary:     Effort: Pulmonary effort is normal. No respiratory distress.     Breath sounds: Normal breath sounds.     Comments: 02 Abdominal:     General: Bowel sounds are normal. There is no distension.     Palpations: Abdomen is soft.     Tenderness: There is no abdominal tenderness.  Musculoskeletal:     Cervical back: Neck supple.     Right lower leg: Edema present.     Left lower leg: Edema present.     Comments: Is able to move all extremities Right hip arthroplasty 10-10-20 for right femoral neck fracture.     1+ bilateral lower extremity edema   Lymphadenopathy:     Cervical: No cervical adenopathy.  Skin:    General: Skin is warm and dry.  Neurological:     Mental Status: He is alert and oriented to person, place, and time.  Psychiatric:        Mood and Affect: Mood and affect and mood normal.     ASSESSMENT/ PLAN:  TODAY   1. Bilateral rotator cuff: he  does have shoulder pain; will change to tylenol cr four times daily   2. Chronic respiratory failure with hypoxia is stable 02 dependent will monitor   3. GERD without esophagitis: is stable will continue protonix 40 mg twice daily   PREVIOUS   4.  Hypothyroidism unspecified type: is stable tsh 3.236 will continue synthroid 75 mcg daily   5. Moderate protein calorie malnutrition: is stable continues remeron 7.5 mg nightly for appetite.   6. Benign prostatic hyperplasia without lower urinary tract symptoms / history prostate cancer: is stable will continue flomax 0.4 mg daily   7. Acute blood loss anemia as cause of post operative anemia: is stable hgb 11.7 is mild will monitor   8. Vitamin D deficiency: D is 28.37 will continue vit D 50,000 units weekly   9.  Syncope and collapse: is stable will monitor    10. Closed right hip fracture with routine healing subsequent encounter: is stable will continue therapy as directed and will follow up with orthopedics. Will continue asa 81 mg twice daily through 11-22-20 then daily;   11. Aortic atherosclerosis (cxr 02-17-20); will monitor   12. GOLD COPD3/group d: is 02 dependent; will continue trelegy 100-62.5-25 mcg 1 puff daily; prednisone 10 mg daily mucinex 600 mg twice daily as needed.     MD is aware of resident's narcotic use and is in agreement with current plan of care. We will attempt to wean resident as appropriate.  Ok Edwards NP [facility]  Contact [phone] Monday through Friday 8am- 5pm  After hours call [phone]

## 2020-11-08 ENCOUNTER — Other Ambulatory Visit: Payer: Self-pay | Admitting: Family Medicine

## 2020-11-14 ENCOUNTER — Encounter: Payer: Self-pay | Admitting: Adult Health

## 2020-11-14 ENCOUNTER — Telehealth: Payer: Self-pay | Admitting: Internal Medicine

## 2020-11-14 NOTE — Progress Notes (Signed)
Location:  [place] Room Number: 155/P Place of Service:  SNF (31)   CODE STATUS: Full Code  Allergies  Allergen Reactions  . Morphine Other (See Comments)    REACTION: sweats    Chief Complaint  Patient presents with  . Short Term Rehab (STR)    Routine Visit    HPI:    Past Medical History:  Diagnosis Date  . Allergic rhinitis   . Cataract    s/p removal  . Chronic respiratory failure (HCC)    oxygen 3L at home  . Emphysema   . Hypothyroid   . PNA (pneumonia)   . Prostate cancer [facility])     Past Surgical History:  Procedure Laterality Date  . ADENOIDECTOMY    . HIP ARTHROPLASTY Right 10/10/2020   Procedure: ARTHROPLASTY BIPOLAR HIP (HEMIARTHROPLASTY);  Surgeon: Mordecai Rasmussen, MD;  Location: [facility]S;  Service: Orthopedics;  Laterality: Right;  . ROTATOR CUFF REPAIR  1990,2009   bilateral  . Seed implant for prostate cancer  2000  . TONSILLECTOMY    . TRANSURETHRAL RESECTION OF PROSTATE  2001   x2    Social History   Socioeconomic History  . Marital status: Married    Spouse name: Not on file  . Number of children: Not on file  . Years of education: Not on file  . Highest education level: Not on file  Occupational History  . Occupation: Chief Financial Officer  Tobacco Use  . Smoking status: Former Smoker    Packs/day: 1.50    Years: 50.00    Pack years: 75.00    Types: Cigarettes    Quit date: 09/17/2008    Years since quitting: 12.1  . Smokeless tobacco: Never Used  Vaping Use  . Vaping Use: Never used  Substance and Sexual Activity  . Alcohol use: Yes    Alcohol/week: 1.0 standard drink    Types: 1 Glasses of wine per week    Comment: each evening  . Drug use: No  . Sexual activity: Not on file  Other Topics Concern  . Not on file  Social History Narrative  . Not on file   Social Determinants of Health   Financial Resource Strain: Low Risk   . Difficulty of Paying Living Expenses: Not very hard  Food Insecurity: Not on  file  Transportation Needs: Not on file  Physical Activity: Not on file  Stress: Not on file  Social Connections: Not on file  Intimate Partner Violence: Not on file   Family History  Problem Relation Age of Onset  . Heart disease Mother   . Prostate cancer Father       VITAL SIGNS BP 130/79   Pulse 70   Temp 98.1 F (36.7 C)   Resp 20   Ht 5\' 5"  (1.651 m)   Wt 120 lb 9.6 oz (54.7 kg)   SpO2 96%   BMI 20.07 kg/m   Outpatient Encounter Medications as of 11/14/2020  Medication Sig  . acetaminophen (TYLENOL) 650 MG CR tablet Take 650 mg by mouth 3 (three) times daily.  Marland Kitchen aspirin EC 81 MG tablet Take 81 mg by mouth. Twice a day from 10/12/2020-11/22/2020 Then take once a day starting 11/23/2020  . Cholecalciferol 1.25 MG (50000 UT) capsule Take 50,000 Units by mouth once a week. On Wednesday  . Ensure (ENSURE) Take 237 mLs by mouth daily.  . Fluticasone-Umeclidin-Vilant (TRELEGY ELLIPTA) 100-62.5-25 MCG/INH AEPB Inhale 1 puff into the lungs daily.  . furosemide (LASIX)  20 MG tablet Take 20 mg by mouth daily.  Marland Kitchen guaiFENesin (MUCINEX) 600 MG 12 hr tablet Take 600 mg by mouth 2 (two) times daily as needed for cough.  . levothyroxine (SYNTHROID) 75 MCG tablet TAKE 1 TABLET (75 MCG TOTAL) BY MOUTH DAILY.  . melatonin 1 MG TABS tablet Take 3 mg by mouth at bedtime.  . mirtazapine (REMERON) 15 MG tablet TAKE 1/2 TABLET BY MOUTH IN THE EVENING  . NON FORMULARY Diet: _____ Regular, ___x___ NAS, _______Consistent Carbohydrate, _______NPO _____Other  . OXYGEN Inhale 4 L into the lungs continuous.  . pantoprazole (PROTONIX) 40 MG tablet Take 1 tablet (40 mg total) by mouth 2 (two) times daily.  . polyvinyl alcohol (LIQUIFILM TEARS) 1.4 % ophthalmic solution Place 1 drop into both eyes 3 (three) times daily as needed for dry eyes.  . predniSONE (DELTASONE) 10 MG tablet Take 1 tablet (10 mg total) by mouth daily with breakfast. HOLD while on Prednisone taper  . tamsulosin (FLOMAX) 0.4 MG CAPS  capsule TAKE 1 CAPSULE BY MOUTH EVERY DAY  . [DISCONTINUED] zolpidem (AMBIEN) 5 MG tablet Take 5 mg by mouth at bedtime.   No facility-administered encounter medications on file as of 11/14/2020.     SIGNIFICANT DIAGNOSTIC EXAMS       ASSESSMENT/ PLAN:    MD is aware of resident's narcotic use and is in agreement with current plan of care. We will attempt to wean resident as appropriate.  Ok Edwards NP [facility] Adult Medicine  Contact [phone] Monday through Friday 8am- 5pm  After hours call [phone]

## 2020-11-14 NOTE — Telephone Encounter (Signed)
pts wife called and I have read her the instruction from his last OV with MW.  I have faxed this over to the [facility] per her request so they will have this for the pt.

## 2020-11-15 ENCOUNTER — Encounter: Payer: PPO | Admitting: Family Medicine

## 2020-11-15 DIAGNOSIS — F411 Generalized anxiety disorder: Secondary | ICD-10-CM | POA: Diagnosis not present

## 2020-11-15 DIAGNOSIS — J9611 Chronic respiratory failure with hypoxia: Secondary | ICD-10-CM | POA: Diagnosis not present

## 2020-11-15 DIAGNOSIS — D62 Acute posthemorrhagic anemia: Secondary | ICD-10-CM | POA: Diagnosis not present

## 2020-11-15 DIAGNOSIS — K219 Gastro-esophageal reflux disease without esophagitis: Secondary | ICD-10-CM | POA: Diagnosis not present

## 2020-11-15 DIAGNOSIS — K4090 Unilateral inguinal hernia, without obstruction or gangrene, not specified as recurrent: Secondary | ICD-10-CM | POA: Diagnosis not present

## 2020-11-15 DIAGNOSIS — N400 Enlarged prostate without lower urinary tract symptoms: Secondary | ICD-10-CM | POA: Diagnosis not present

## 2020-11-15 DIAGNOSIS — E039 Hypothyroidism, unspecified: Secondary | ICD-10-CM | POA: Diagnosis not present

## 2020-11-15 DIAGNOSIS — G4700 Insomnia, unspecified: Secondary | ICD-10-CM | POA: Diagnosis not present

## 2020-11-15 DIAGNOSIS — Z7952 Long term (current) use of systemic steroids: Secondary | ICD-10-CM | POA: Diagnosis not present

## 2020-11-15 DIAGNOSIS — R55 Syncope and collapse: Secondary | ICD-10-CM | POA: Diagnosis not present

## 2020-11-15 DIAGNOSIS — R2681 Unsteadiness on feet: Secondary | ICD-10-CM | POA: Diagnosis not present

## 2020-11-15 DIAGNOSIS — H04123 Dry eye syndrome of bilateral lacrimal glands: Secondary | ICD-10-CM | POA: Diagnosis not present

## 2020-11-15 DIAGNOSIS — M6281 Muscle weakness (generalized): Secondary | ICD-10-CM | POA: Diagnosis not present

## 2020-11-15 DIAGNOSIS — R1310 Dysphagia, unspecified: Secondary | ICD-10-CM | POA: Diagnosis not present

## 2020-11-15 DIAGNOSIS — Z96641 Presence of right artificial hip joint: Secondary | ICD-10-CM | POA: Diagnosis not present

## 2020-11-15 DIAGNOSIS — I700 Atherosclerosis of aorta: Secondary | ICD-10-CM | POA: Diagnosis not present

## 2020-11-15 DIAGNOSIS — E440 Moderate protein-calorie malnutrition: Secondary | ICD-10-CM | POA: Diagnosis not present

## 2020-11-15 DIAGNOSIS — Z9981 Dependence on supplemental oxygen: Secondary | ICD-10-CM | POA: Diagnosis not present

## 2020-11-15 DIAGNOSIS — M19011 Primary osteoarthritis, right shoulder: Secondary | ICD-10-CM | POA: Diagnosis not present

## 2020-11-15 DIAGNOSIS — J439 Emphysema, unspecified: Secondary | ICD-10-CM | POA: Diagnosis not present

## 2020-11-15 DIAGNOSIS — R262 Difficulty in walking, not elsewhere classified: Secondary | ICD-10-CM | POA: Diagnosis not present

## 2020-11-15 DIAGNOSIS — S72001D Fracture of unspecified part of neck of right femur, subsequent encounter for closed fracture with routine healing: Secondary | ICD-10-CM | POA: Diagnosis not present

## 2020-11-15 DIAGNOSIS — J449 Chronic obstructive pulmonary disease, unspecified: Secondary | ICD-10-CM | POA: Diagnosis not present

## 2020-11-15 DIAGNOSIS — E559 Vitamin D deficiency, unspecified: Secondary | ICD-10-CM | POA: Diagnosis not present

## 2020-11-15 DIAGNOSIS — Z8546 Personal history of malignant neoplasm of prostate: Secondary | ICD-10-CM | POA: Diagnosis not present

## 2020-11-15 DIAGNOSIS — Z9181 History of falling: Secondary | ICD-10-CM | POA: Diagnosis not present

## 2020-11-15 DIAGNOSIS — J189 Pneumonia, unspecified organism: Secondary | ICD-10-CM | POA: Diagnosis not present

## 2020-11-15 NOTE — Progress Notes (Signed)
This encounter was created in error - please disregard.

## 2020-11-17 ENCOUNTER — Encounter: Payer: Self-pay | Admitting: Adult Health

## 2020-11-17 ENCOUNTER — Other Ambulatory Visit: Payer: Self-pay | Admitting: Adult Health

## 2020-11-17 ENCOUNTER — Non-Acute Institutional Stay (SKILLED_NURSING_FACILITY): Payer: PPO | Admitting: Adult Health

## 2020-11-17 DIAGNOSIS — J9611 Chronic respiratory failure with hypoxia: Secondary | ICD-10-CM | POA: Diagnosis not present

## 2020-11-17 DIAGNOSIS — J449 Chronic obstructive pulmonary disease, unspecified: Secondary | ICD-10-CM | POA: Diagnosis not present

## 2020-11-17 NOTE — Progress Notes (Signed)
Location:  [place] Room Number: 643 Place of Service:  SNF (31)   CODE STATUS: full code   Allergies  Allergen Reactions  . Morphine Other (See Comments)    REACTION: sweats    Chief Complaint  Patient presents with  . Acute Visit    Staff concerns     HPI:  He is having increased difficulty maintaining his 02 saturations with activity. He takes longer for him to recover from the low 02 sats. Up to 2-4 minutes. He denies any excessive shortness of breath. He tells me that his sats will drop with activities and he will rest and will recover. He denies any cough; wheezing or chest pain.   Past Medical History:  Diagnosis Date  . Allergic rhinitis   . Cataract    s/p removal  . Chronic respiratory failure (HCC)    oxygen 3L at home  . Emphysema   . Hypothyroid   . PNA (pneumonia)   . Prostate cancer [facility])     Past Surgical History:  Procedure Laterality Date  . ADENOIDECTOMY    . HIP ARTHROPLASTY Right 10/10/2020   Procedure: ARTHROPLASTY BIPOLAR HIP (HEMIARTHROPLASTY);  Surgeon: Mordecai Rasmussen, MD;  Location: [facility];  Service: Orthopedics;  Laterality: Right;  . ROTATOR CUFF REPAIR  1990,2009   bilateral  . Seed implant for prostate cancer  2000  . TONSILLECTOMY    . TRANSURETHRAL RESECTION OF PROSTATE  2001   x2    Social History   Socioeconomic History  . Marital status: Married    Spouse name: Not on file  . Number of children: Not on file  . Years of education: Not on file  . Highest education level: Not on file  Occupational History  . Occupation: Chief Financial Officer  Tobacco Use  . Smoking status: Former Smoker    Packs/day: 1.50    Years: 50.00    Pack years: 75.00    Types: Cigarettes    Quit date: 09/17/2008    Years since quitting: 12.1  . Smokeless tobacco: Never Used  Vaping Use  . Vaping Use: Never used  Substance and Sexual Activity  . Alcohol use: Yes    Alcohol/week: 1.0 standard drink    Types: 1 Glasses of wine  per week    Comment: each evening  . Drug use: No  . Sexual activity: Not on file  Other Topics Concern  . Not on file  Social History Narrative  . Not on file   Social Determinants of Health   Financial Resource Strain: Low Risk   . Difficulty of Paying Living Expenses: Not very hard  Food Insecurity: Not on file  Transportation Needs: Not on file  Physical Activity: Not on file  Stress: Not on file  Social Connections: Not on file  Intimate Partner Violence: Not on file   Family History  Problem Relation Age of Onset  . Heart disease Mother   . Prostate cancer Father       VITAL SIGNS BP 98/63   Pulse 68   Temp (!) 97.3 F (36.3 C)   Resp 18   Ht 5\' 5"  (1.651 m)   Wt 120 lb 9.6 oz (54.7 kg)   BMI 20.07 kg/m   Outpatient Encounter Medications as of 11/17/2020  Medication Sig  . acetaminophen (TYLENOL) 650 MG CR tablet Take 650 mg by mouth 3 (three) times daily.  Marland Kitchen aspirin EC 81 MG tablet Take 81 mg by mouth. Twice a  day from 10/12/2020-11/22/2020 Then take once a day starting 11/23/2020  . Cholecalciferol 1.25 MG (50000 UT) capsule Take 50,000 Units by mouth once a week. On Wednesday  . Ensure (ENSURE) Take 237 mLs by mouth daily.  . Fluticasone-Umeclidin-Vilant (TRELEGY ELLIPTA) 100-62.5-25 MCG/INH AEPB Inhale 1 puff into the lungs daily.  . furosemide (LASIX) 20 MG tablet Take 20 mg by mouth daily.  Marland Kitchen guaiFENesin (MUCINEX) 600 MG 12 hr tablet Take 600 mg by mouth 2 (two) times daily as needed for cough.  . levothyroxine (SYNTHROID) 75 MCG tablet TAKE 1 TABLET (75 MCG TOTAL) BY MOUTH DAILY.  . melatonin 1 MG TABS tablet Take 3 mg by mouth at bedtime.  . mirtazapine (REMERON) 15 MG tablet TAKE 1/2 TABLET BY MOUTH IN THE EVENING  . NON FORMULARY Diet: _____ Regular, ___x___ NAS, _______Consistent Carbohydrate, _______NPO _____Other  . OXYGEN Inhale 4 L into the lungs continuous.  . pantoprazole (PROTONIX) 40 MG tablet Take 1 tablet (40 mg total) by mouth 2 (two)  times daily.  . polyvinyl alcohol (LIQUIFILM TEARS) 1.4 % ophthalmic solution Place 1 drop into both eyes 3 (three) times daily as needed for dry eyes.  . predniSONE (DELTASONE) 10 MG tablet Take 1 tablet (10 mg total) by mouth daily with breakfast. HOLD while on Prednisone taper  . tamsulosin (FLOMAX) 0.4 MG CAPS capsule TAKE 1 CAPSULE BY MOUTH EVERY DAY   No facility-administered encounter medications on file as of 11/17/2020.     SIGNIFICANT DIAGNOSTIC EXAMS  PREVIOUS   09-09-20: echo bubble  1. Left ventricular ejection fraction, by estimation, is 50 to 55%. The left ventricle has low normal function. The left ventricle has no regional wall motion abnormalities. There is moderate left ventricular hypertrophy. Left ventricular diastolic parameters are consistent with Grade I diastolic dysfunction (impaired relaxation   10-09-20: pelvic x-ray: Right femoral neck fracture.   10-09-20: chest x-ray: No active cardiopulmonary disease identified   10-20-20 ct of head and cervical spine:  CT head: 1. Mildly motion degraded exam. 2. No evidence of acute intracranial abnormality. 3. Redemonstrated small chronic cortical infarcts within the bilateral frontal lobes, left parietal and right occipital lobes. 4. Stable background mild-to-moderate cerebral atrophy and severe chronic small vessel ischemic disease. 5. Known small chronic infarcts within the bilateral cerebellar hemispheres. 6. Mild paranasal disease, as described. CT cervical spine: 1. Mildly motion degraded exam. 2. No evidence of acute fracture to the cervical spine. 3. Nonspecific reversal of the expected cervical lordosis. 4. Minimal C2-C3 grade 1 anterolisthesis and C5-C6 grade 1 retrolisthesis. 5. Cervical spondylosis as described with up to moderate appreciable spinal canal stenosis and multilevel bony neural foraminal narrowing. Degenerative fusion at C4-C5. 6. Biapical bullous emphysema   10-20-20 ct angio of chest:  1. No  acute intrathoracic pathology. No CT evidence of pulmonary artery embolus. 2. Severe emphysema with right upper lobe scarring and architectural distortion. 3. A 6 mm left lower lobe nodule. 4. Aortic Atherosclerosis  and Emphysema   10-21-20: carotid artery doppler: No hemodynamically significant stenosis of the internal carotid arteries.  10-22-20: 2-d echo:  1. Suboptimal acoustic windows for imaging, study is limited.   2. Left ventricular ejection fraction, by estimation, is 45 to 50%. The  left ventricle has mildly decreased function. Left ventricular endocardial  border not optimally defined to evaluate regional wall motion. There is  mild left ventricular hypertrophy. Left ventricular diastolic parameters are indeterminate   NO NEW EXAMS.    LABS REVIEWED PREVIOUS   09-09-20:  hgb a1c 5.7; chol 270; ldl 140; trig 47; hdl 121 10-09-20: wbc 13.0; hgb 12.9; hct 40.7; mcv 94.7 plt 286; glucose 115; bun 25; creat 0.85; k+ 3.9; na++ 135; ca 9.2 GFR>60 10-12-20; wbc 11.0; hgb 11.9; hct 36.7; mcv 992.9 plt 297 glucose 119; bun 25; creat 0.76; k+ 4.1; na++ 134; ca 8.5 GFR >60 10-20-20: wbc 11.1; hgb 11.0; hct 35.6; mcv 96.7 plt 444; glucose 137; bun 22; creat 1.06; k+ 5.3; na++ 133; ca 8.9 GFR>60; liver normal albumin 3.2  10-21-20: wbc 14.3; hgb 11.7; hct 37.7; mcv 96.4 plt 517; glucose 94; bun 16; creat 0.78; k+ 4.0; na++ 137; ca 9.5 GFR>60; liver normal albumin 3.4  10-27-20: wbc 12.4; hgb 11.6; hct 37.4; mcv 96.6 plt 477; glucose 87; bun 23; creat 0.86; k+ 3.; na++ 136; ca 9.2; GFR>60 10-28-20: wbc 8.1; hgb 11.2; hct 36.2; mcv 96.8 plt 429; glucose 88; bun 21; creat 0.86; k+ 4.3; na++ 136; ca 9.2; GFR>60 11-07-20: glucose 88; bun 37; creat 0.95; k+ 3.7; na++ 139; ca 9.4 GFR>60  NO NEW LABS  Review of Systems  Constitutional: Negative for malaise/fatigue.  Respiratory: Positive for shortness of breath. Negative for cough.        With activity   Cardiovascular: Negative for chest pain, palpitations  and leg swelling.  Gastrointestinal: Negative for abdominal pain, constipation and heartburn.  Musculoskeletal: Negative for back pain, joint pain and myalgias.  Skin: Negative.   Neurological: Negative for dizziness.  Psychiatric/Behavioral: The patient is not nervous/anxious.     Physical Exam Constitutional:      General: He is not in acute distress.    Appearance: He is well-developed and well-nourished. He is not diaphoretic.  Neck:     Thyroid: No thyromegaly.  Cardiovascular:     Rate and Rhythm: Normal rate and regular rhythm.     Pulses: Normal pulses and intact distal pulses.     Heart sounds: Normal heart sounds.  Pulmonary:     Effort: Pulmonary effort is normal. No respiratory distress.     Breath sounds: Wheezing present.     Comments: 02 dependent Scattered wheezes  Abdominal:     General: Bowel sounds are normal. There is no distension.     Palpations: Abdomen is soft.     Tenderness: There is no abdominal tenderness.  Musculoskeletal:        General: No edema. Normal range of motion.     Cervical back: Neck supple.     Right lower leg: No edema.     Left lower leg: No edema.  Lymphadenopathy:     Cervical: No cervical adenopathy.  Skin:    General: Skin is warm and dry.  Neurological:     Mental Status: He is alert and oriented to person, place, and time.  Psychiatric:        Mood and Affect: Mood and affect and mood normal.        ASSESSMENT/ PLAN:   TODAY  1. Chronic respiratory failure with hypoxia 2. COPD GOLD /3 group D 02   At this time will get him a chest x-ray due to his wheezing.  Will further treat as indicated.     Ok Edwards NP [facility]  Contact [phone] Monday through Friday 8am- 5pm  After hours call [phone]

## 2020-11-17 NOTE — Progress Notes (Signed)
Location:  [place] Room Number: 155/P Place of Service:  SNF (31)   CODE STATUS: Full Code  Allergies  Allergen Reactions  . Morphine Other (See Comments)    REACTION: sweats    Chief Complaint  Patient presents with  . Acute Visit    Staff Concerns    HPI:  He is   Past Medical History:  Diagnosis Date  . Allergic rhinitis   . Cataract    s/p removal  . Chronic respiratory failure (HCC)    oxygen 3L at home  . Emphysema   . Hypothyroid   . PNA (pneumonia)   . Prostate cancer [facility])     Past Surgical History:  Procedure Laterality Date  . ADENOIDECTOMY    . HIP ARTHROPLASTY Right 10/10/2020   Procedure: ARTHROPLASTY BIPOLAR HIP (HEMIARTHROPLASTY);  Surgeon: Mordecai Rasmussen, MD;  Location: [facility];  Service: Orthopedics;  Laterality: Right;  . ROTATOR CUFF REPAIR  1990,2009   bilateral  . Seed implant for prostate cancer  2000  . TONSILLECTOMY    . TRANSURETHRAL RESECTION OF PROSTATE  2001   x2    Social History   Socioeconomic History  . Marital status: Married    Spouse name: Not on file  . Number of children: Not on file  . Years of education: Not on file  . Highest education level: Not on file  Occupational History  . Occupation: Chief Financial Officer  Tobacco Use  . Smoking status: Former Smoker    Packs/day: 1.50    Years: 50.00    Pack years: 75.00    Types: Cigarettes    Quit date: 09/17/2008    Years since quitting: 12.1  . Smokeless tobacco: Never Used  Vaping Use  . Vaping Use: Never used  Substance and Sexual Activity  . Alcohol use: Yes    Alcohol/week: 1.0 standard drink    Types: 1 Glasses of wine per week    Comment: each evening  . Drug use: No  . Sexual activity: Not on file  Other Topics Concern  . Not on file  Social History Narrative  . Not on file   Social Determinants of Health   Financial Resource Strain: Low Risk   . Difficulty of Paying Living Expenses: Not very hard  Food Insecurity: Not on file   Transportation Needs: Not on file  Physical Activity: Not on file  Stress: Not on file  Social Connections: Not on file  Intimate Partner Violence: Not on file   Family History  Problem Relation Age of Onset  . Heart disease Mother   . Prostate cancer Father       VITAL SIGNS BP 98/63   Pulse 68   Temp (!) 97.3 F (36.3 C)   Resp 18   Ht 5\' 5"  (1.651 m)   Wt 120 lb 9.6 oz (54.7 kg)   SpO2 95%   BMI 20.07 kg/m   Outpatient Encounter Medications as of 11/17/2020  Medication Sig  . acetaminophen (TYLENOL) 650 MG CR tablet Take 650 mg by mouth 3 (three) times daily.  Marland Kitchen albuterol (ACCUNEB) 0.63 MG/3ML nebulizer solution Take 1 ampule by nebulization every 4 (four) hours as needed for wheezing.  . Albuterol Sulfate (PROAIR RESPICLICK) 409 (90 Base) MCG/ACT AEPB Inhale 2 puffs into the lungs every 4 (four) hours as needed.  Marland Kitchen aspirin EC 81 MG tablet Take 81 mg by mouth. Twice a day from 10/12/2020-11/22/2020 Then take once a day starting 11/23/2020  .  Cholecalciferol 1.25 MG (50000 UT) capsule Take 50,000 Units by mouth once a week. On Wednesday  . Ensure (ENSURE) Take 237 mLs by mouth daily.  . Fluticasone-Umeclidin-Vilant (TRELEGY ELLIPTA) 100-62.5-25 MCG/INH AEPB Inhale 1 puff into the lungs daily.  . furosemide (LASIX) 20 MG tablet Take 20 mg by mouth daily.  Marland Kitchen guaiFENesin (MUCINEX) 600 MG 12 hr tablet Take 600 mg by mouth 2 (two) times daily as needed for cough.  . levothyroxine (SYNTHROID) 75 MCG tablet TAKE 1 TABLET (75 MCG TOTAL) BY MOUTH DAILY.  . melatonin 1 MG TABS tablet Take 1 mg by mouth at bedtime.  . mirtazapine (REMERON) 15 MG tablet TAKE 1/2 TABLET BY MOUTH IN THE EVENING  . NON FORMULARY Diet: _____ Regular, ___x___ NAS, _______Consistent Carbohydrate, _______NPO _____Other  . OXYGEN Inhale 4 L into the lungs continuous.  . pantoprazole (PROTONIX) 40 MG tablet Take 1 tablet (40 mg total) by mouth 2 (two) times daily.  . polyvinyl alcohol (LIQUIFILM TEARS) 1.4 %  ophthalmic solution Place 1 drop into both eyes 3 (three) times daily as needed for dry eyes.  . predniSONE (DELTASONE) 10 MG tablet Take 1 tablet (10 mg total) by mouth daily with breakfast. HOLD while on Prednisone taper  . tamsulosin (FLOMAX) 0.4 MG CAPS capsule TAKE 1 CAPSULE BY MOUTH EVERY DAY   No facility-administered encounter medications on file as of 11/17/2020.     SIGNIFICANT DIAGNOSTIC EXAMS       ASSESSMENT/ PLAN:     Ok Edwards NP [facility] Adult Medicine  Contact [phone] Monday through Friday 8am- 5pm  After hours call [phone]   This encounter was created in error - please disregard.

## 2020-11-18 ENCOUNTER — Non-Acute Institutional Stay (SKILLED_NURSING_FACILITY): Payer: PPO | Admitting: Adult Health

## 2020-11-18 ENCOUNTER — Encounter: Payer: Self-pay | Admitting: Adult Health

## 2020-11-18 ENCOUNTER — Other Ambulatory Visit: Payer: PPO

## 2020-11-18 ENCOUNTER — Ambulatory Visit: Payer: PPO | Admitting: Nurse Practitioner

## 2020-11-18 DIAGNOSIS — J449 Chronic obstructive pulmonary disease, unspecified: Secondary | ICD-10-CM

## 2020-11-18 DIAGNOSIS — J189 Pneumonia, unspecified organism: Secondary | ICD-10-CM | POA: Diagnosis not present

## 2020-11-18 DIAGNOSIS — J9611 Chronic respiratory failure with hypoxia: Secondary | ICD-10-CM | POA: Diagnosis not present

## 2020-11-18 DIAGNOSIS — E440 Moderate protein-calorie malnutrition: Secondary | ICD-10-CM

## 2020-11-18 DIAGNOSIS — E039 Hypothyroidism, unspecified: Secondary | ICD-10-CM

## 2020-11-18 DIAGNOSIS — E782 Mixed hyperlipidemia: Secondary | ICD-10-CM

## 2020-11-18 NOTE — Progress Notes (Signed)
Location:  [place] Room Number: 299 Place of Service:  SNF (31)   CODE STATUS: full  Allergies  Allergen Reactions  . Morphine Other (See Comments)    REACTION: sweats    Chief Complaint  Patient presents with  . Acute Visit    Follow up chest xray     HPI:  He had a chest x-ray yesterday which demonstrates bilateral pneumonia. He denies any coughing; no worsening shortness of breath no fevers. He was due to go home today. However with his pneumonia he should stay over the weekend to ensure that his status is stable. I have spoken with his family at great length; we have come together and have decided that he will stay for least the weekend.   Past Medical History:  Diagnosis Date  . Allergic rhinitis   . Cataract    s/p removal  . Chronic respiratory failure (HCC)    oxygen 3L at home  . Emphysema   . Hypothyroid   . PNA (pneumonia)   . Prostate cancer [facility])     Past Surgical History:  Procedure Laterality Date  . ADENOIDECTOMY    . HIP ARTHROPLASTY Right 10/10/2020   Procedure: ARTHROPLASTY BIPOLAR HIP (HEMIARTHROPLASTY);  Surgeon: Mordecai Rasmussen, MD;  Location: [facility];  Service: Orthopedics;  Laterality: Right;  . ROTATOR CUFF REPAIR  1990,2009   bilateral  . Seed implant for prostate cancer  2000  . TONSILLECTOMY    . TRANSURETHRAL RESECTION OF PROSTATE  2001   x2    Social History   Socioeconomic History  . Marital status: Married    Spouse name: Not on file  . Number of children: Not on file  . Years of education: Not on file  . Highest education level: Not on file  Occupational History  . Occupation: Chief Financial Officer  Tobacco Use  . Smoking status: Former Smoker    Packs/day: 1.50    Years: 50.00    Pack years: 75.00    Types: Cigarettes    Quit date: 09/17/2008    Years since quitting: 12.1  . Smokeless tobacco: Never Used  Vaping Use  . Vaping Use: Never used  Substance and Sexual Activity  . Alcohol use: Yes     Alcohol/week: 1.0 standard drink    Types: 1 Glasses of wine per week    Comment: each evening  . Drug use: No  . Sexual activity: Not on file  Other Topics Concern  . Not on file  Social History Narrative  . Not on file   Social Determinants of Health   Financial Resource Strain: Low Risk   . Difficulty of Paying Living Expenses: Not very hard  Food Insecurity: Not on file  Transportation Needs: Not on file  Physical Activity: Not on file  Stress: Not on file  Social Connections: Not on file  Intimate Partner Violence: Not on file   Family History  Problem Relation Age of Onset  . Heart disease Mother   . Prostate cancer Father       VITAL SIGNS BP 115/60   Pulse 68   Temp (!) 97.3 F (36.3 C)   Resp 18   Ht 5\' 5"  (1.651 m)   Wt 116 lb 6.4 oz (52.8 kg)   SpO2 95%   BMI 19.37 kg/m   Outpatient Encounter Medications as of 11/18/2020  Medication Sig  . moxifloxacin (AVELOX) 400 MG tablet Take 400 mg by mouth daily at 8 pm.  .  acetaminophen (TYLENOL) 650 MG CR tablet Take 650 mg by mouth 3 (three) times daily.  Marland Kitchen albuterol (ACCUNEB) 0.63 MG/3ML nebulizer solution Take 1 ampule by nebulization every 4 (four) hours as needed for wheezing.  . Albuterol Sulfate (PROAIR RESPICLICK) 366 (90 Base) MCG/ACT AEPB Inhale 2 puffs into the lungs every 4 (four) hours as needed.  Marland Kitchen aspirin EC 81 MG tablet Take 81 mg by mouth. Twice a day from 10/12/2020-11/22/2020 Then take once a day starting 11/23/2020  . Cholecalciferol 1.25 MG (50000 UT) capsule Take 50,000 Units by mouth once a week. On Wednesday  . Ensure (ENSURE) Take 237 mLs by mouth daily.  . Fluticasone-Umeclidin-Vilant (TRELEGY ELLIPTA) 100-62.5-25 MCG/INH AEPB Inhale 1 puff into the lungs daily.  . furosemide (LASIX) 20 MG tablet Take 20 mg by mouth daily.  Marland Kitchen guaiFENesin (MUCINEX) 600 MG 12 hr tablet Take 600 mg by mouth 2 (two) times daily as needed for cough.  . levothyroxine (SYNTHROID) 75 MCG tablet TAKE 1 TABLET (75 MCG  TOTAL) BY MOUTH DAILY.  . melatonin 1 MG TABS tablet Take 1 mg by mouth at bedtime.  . mirtazapine (REMERON) 15 MG tablet TAKE 1/2 TABLET BY MOUTH IN THE EVENING  . NON FORMULARY Diet: _____ Regular, ___x___ NAS, _______Consistent Carbohydrate, _______NPO _____Other  . OXYGEN Inhale 4 L into the lungs continuous.  . pantoprazole (PROTONIX) 40 MG tablet Take 1 tablet (40 mg total) by mouth 2 (two) times daily.  . polyvinyl alcohol (LIQUIFILM TEARS) 1.4 % ophthalmic solution Place 1 drop into both eyes 3 (three) times daily as needed for dry eyes.  . predniSONE (DELTASONE) 10 MG tablet Take 1 tablet (10 mg total) by mouth daily with breakfast. HOLD while on Prednisone taper  . tamsulosin (FLOMAX) 0.4 MG CAPS capsule TAKE 1 CAPSULE BY MOUTH EVERY DAY   No facility-administered encounter medications on file as of 11/18/2020.     SIGNIFICANT DIAGNOSTIC EXAMS  PREVIOUS   09-09-20: echo bubble  1. Left ventricular ejection fraction, by estimation, is 50 to 55%. The left ventricle has low normal function. The left ventricle has no regional wall motion abnormalities. There is moderate left ventricular hypertrophy. Left ventricular diastolic parameters are consistent with Grade I diastolic dysfunction (impaired relaxation   10-09-20: pelvic x-ray: Right femoral neck fracture.   10-09-20: chest x-ray: No active cardiopulmonary disease identified   10-20-20 ct of head and cervical spine:  CT head: 1. Mildly motion degraded exam. 2. No evidence of acute intracranial abnormality. 3. Redemonstrated small chronic cortical infarcts within the bilateral frontal lobes, left parietal and right occipital lobes. 4. Stable background mild-to-moderate cerebral atrophy and severe chronic small vessel ischemic disease. 5. Known small chronic infarcts within the bilateral cerebellar hemispheres. 6. Mild paranasal disease, as described. CT cervical spine: 1. Mildly motion degraded exam. 2. No evidence of acute  fracture to the cervical spine. 3. Nonspecific reversal of the expected cervical lordosis. 4. Minimal C2-C3 grade 1 anterolisthesis and C5-C6 grade 1 retrolisthesis. 5. Cervical spondylosis as described with up to moderate appreciable spinal canal stenosis and multilevel bony neural foraminal narrowing. Degenerative fusion at C4-C5. 6. Biapical bullous emphysema   10-20-20 ct angio of chest:  1. No acute intrathoracic pathology. No CT evidence of pulmonary artery embolus. 2. Severe emphysema with right upper lobe scarring and architectural distortion. 3. A 6 mm left lower lobe nodule. 4. Aortic Atherosclerosis  and Emphysema   10-21-20: carotid artery doppler: No hemodynamically significant stenosis of the internal carotid arteries.  10-22-20:  2-d echo:  1. Suboptimal acoustic windows for imaging, study is limited.   2. Left ventricular ejection fraction, by estimation, is 45 to 50%. The  left ventricle has mildly decreased function. Left ventricular endocardial  border not optimally defined to evaluate regional wall motion. There is  mild left ventricular hypertrophy. Left ventricular diastolic parameters are indeterminate   TODAY  11-18-19: chest xray: bilateral pneumonia     LABS REVIEWED PREVIOUS   09-09-20: hgb a1c 5.7; chol 270; ldl 140; trig 47; hdl 121 10-09-20: wbc 13.0; hgb 12.9; hct 40.7; mcv 94.7 plt 286; glucose 115; bun 25; creat 0.85; k+ 3.9; na++ 135; ca 9.2 GFR>60 10-12-20; wbc 11.0; hgb 11.9; hct 36.7; mcv 992.9 plt 297 glucose 119; bun 25; creat 0.76; k+ 4.1; na++ 134; ca 8.5 GFR >60 10-20-20: wbc 11.1; hgb 11.0; hct 35.6; mcv 96.7 plt 444; glucose 137; bun 22; creat 1.06; k+ 5.3; na++ 133; ca 8.9 GFR>60; liver normal albumin 3.2  10-21-20: wbc 14.3; hgb 11.7; hct 37.7; mcv 96.4 plt 517; glucose 94; bun 16; creat 0.78; k+ 4.0; na++ 137; ca 9.5 GFR>60; liver normal albumin 3.4  10-27-20: wbc 12.4; hgb 11.6; hct 37.4; mcv 96.6 plt 477; glucose 87; bun 23; creat 0.86; k+ 3.; na++ 136;  ca 9.2; GFR>60 10-28-20: wbc 8.1; hgb 11.2; hct 36.2; mcv 96.8 plt 429; glucose 88; bun 21; creat 0.86; k+ 4.3; na++ 136; ca 9.2; GFR>60 11-07-20: glucose 88; bun 37; creat 0.95; k+ 3.7; na++ 139; ca 9.4 GFR>60  NO NEW LABS  Review of Systems  Constitutional: Negative for malaise/fatigue.  Respiratory: Negative for cough and shortness of breath.   Cardiovascular: Negative for chest pain, palpitations and leg swelling.  Gastrointestinal: Negative for abdominal pain, constipation and heartburn.  Musculoskeletal: Negative for back pain, joint pain and myalgias.  Skin: Negative.   Neurological: Negative for dizziness.  Psychiatric/Behavioral: The patient is not nervous/anxious.     Physical Exam Constitutional:      General: He is not in acute distress.    Appearance: He is well-developed and well-nourished. He is not diaphoretic.  Neck:     Thyroid: No thyromegaly.  Cardiovascular:     Rate and Rhythm: Normal rate and regular rhythm.     Pulses: Intact distal pulses.     Heart sounds: Normal heart sounds.  Pulmonary:     Effort: Pulmonary effort is normal. No respiratory distress.     Breath sounds: Wheezing present.     Comments: 02 dependent  Abdominal:     General: Bowel sounds are normal. There is no distension.     Palpations: Abdomen is soft.     Tenderness: There is no abdominal tenderness.  Musculoskeletal:        General: No edema. Normal range of motion.     Cervical back: Neck supple.     Right lower leg: No edema.     Left lower leg: No edema.  Lymphadenopathy:     Cervical: No cervical adenopathy.  Skin:    General: Skin is warm and dry.  Neurological:     Mental Status: He is alert and oriented to person, place, and time.  Psychiatric:        Mood and Affect: Mood and affect and mood normal.       ASSESSMENT/ PLAN:  TODAY  1. HCAP  2. COPD GOLD 3/group d O2 3. Chronic respiratory failure  Will complete avelox 400 mg daily  Will change to mucinex  600 mg twice  daily  Will monitor his status The goal of his care is to return back home next week.    Ok Edwards NP [facility]  Contact [phone] Monday through Friday 8am- 5pm  After hours call [phone]

## 2020-11-21 DIAGNOSIS — J439 Emphysema, unspecified: Secondary | ICD-10-CM | POA: Diagnosis not present

## 2020-11-21 DIAGNOSIS — J9611 Chronic respiratory failure with hypoxia: Secondary | ICD-10-CM | POA: Diagnosis not present

## 2020-11-21 DIAGNOSIS — R2681 Unsteadiness on feet: Secondary | ICD-10-CM | POA: Diagnosis not present

## 2020-11-22 ENCOUNTER — Encounter: Payer: Self-pay | Admitting: Internal Medicine

## 2020-11-22 ENCOUNTER — Telehealth: Payer: Self-pay | Admitting: Internal Medicine

## 2020-11-22 ENCOUNTER — Other Ambulatory Visit: Payer: Self-pay

## 2020-11-22 ENCOUNTER — Telehealth: Payer: Self-pay | Admitting: Orthopedic Surgery

## 2020-11-22 ENCOUNTER — Ambulatory Visit: Payer: PPO | Admitting: Orthopedic Surgery

## 2020-11-22 ENCOUNTER — Ambulatory Visit (HOSPITAL_COMMUNITY)
Admission: RE | Admit: 2020-11-22 | Discharge: 2020-11-22 | Disposition: A | Discharge status: Home / Self Care | Payer: PPO | Source: Ambulatory Visit | Attending: Internal Medicine | Admitting: Internal Medicine

## 2020-11-22 ENCOUNTER — Ambulatory Visit: Payer: PPO | Admitting: Internal Medicine

## 2020-11-22 VITALS — BP 130/80 | HR 113 | Temp 97.7°F | Ht 65.0 in | Wt 122.0 lb

## 2020-11-22 DIAGNOSIS — S72001D Fracture of unspecified part of neck of right femur, subsequent encounter for closed fracture with routine healing: Secondary | ICD-10-CM | POA: Diagnosis not present

## 2020-11-22 DIAGNOSIS — J9611 Chronic respiratory failure with hypoxia: Secondary | ICD-10-CM

## 2020-11-22 DIAGNOSIS — M47812 Spondylosis without myelopathy or radiculopathy, cervical region: Secondary | ICD-10-CM | POA: Diagnosis not present

## 2020-11-22 DIAGNOSIS — I700 Atherosclerosis of aorta: Secondary | ICD-10-CM | POA: Diagnosis not present

## 2020-11-22 DIAGNOSIS — J449 Chronic obstructive pulmonary disease, unspecified: Secondary | ICD-10-CM | POA: Insufficient documentation

## 2020-11-22 DIAGNOSIS — M4802 Spinal stenosis, cervical region: Secondary | ICD-10-CM | POA: Diagnosis not present

## 2020-11-22 DIAGNOSIS — Z96641 Presence of right artificial hip joint: Secondary | ICD-10-CM | POA: Diagnosis not present

## 2020-11-22 DIAGNOSIS — E441 Mild protein-calorie malnutrition: Secondary | ICD-10-CM | POA: Diagnosis not present

## 2020-11-22 DIAGNOSIS — Z8546 Personal history of malignant neoplasm of prostate: Secondary | ICD-10-CM | POA: Diagnosis not present

## 2020-11-22 DIAGNOSIS — E039 Hypothyroidism, unspecified: Secondary | ICD-10-CM | POA: Diagnosis not present

## 2020-11-22 DIAGNOSIS — W19XXXD Unspecified fall, subsequent encounter: Secondary | ICD-10-CM | POA: Diagnosis not present

## 2020-11-22 DIAGNOSIS — Z9181 History of falling: Secondary | ICD-10-CM | POA: Diagnosis not present

## 2020-11-22 DIAGNOSIS — R059 Cough, unspecified: Secondary | ICD-10-CM | POA: Diagnosis not present

## 2020-11-22 DIAGNOSIS — J189 Pneumonia, unspecified organism: Secondary | ICD-10-CM | POA: Diagnosis not present

## 2020-11-22 DIAGNOSIS — J439 Emphysema, unspecified: Secondary | ICD-10-CM | POA: Diagnosis not present

## 2020-11-22 IMAGING — CR DG CHEST 2V
3 series · 3 of 3 positions shown · non-contrast
Comparison: CT chest [DATE]. Chest x-ray [DATE],
[DATE], [DATE].

CLINICAL DATA: Cough.

EXAM:
CHEST - 2 VIEW

[w chest lat (1 of 2)]
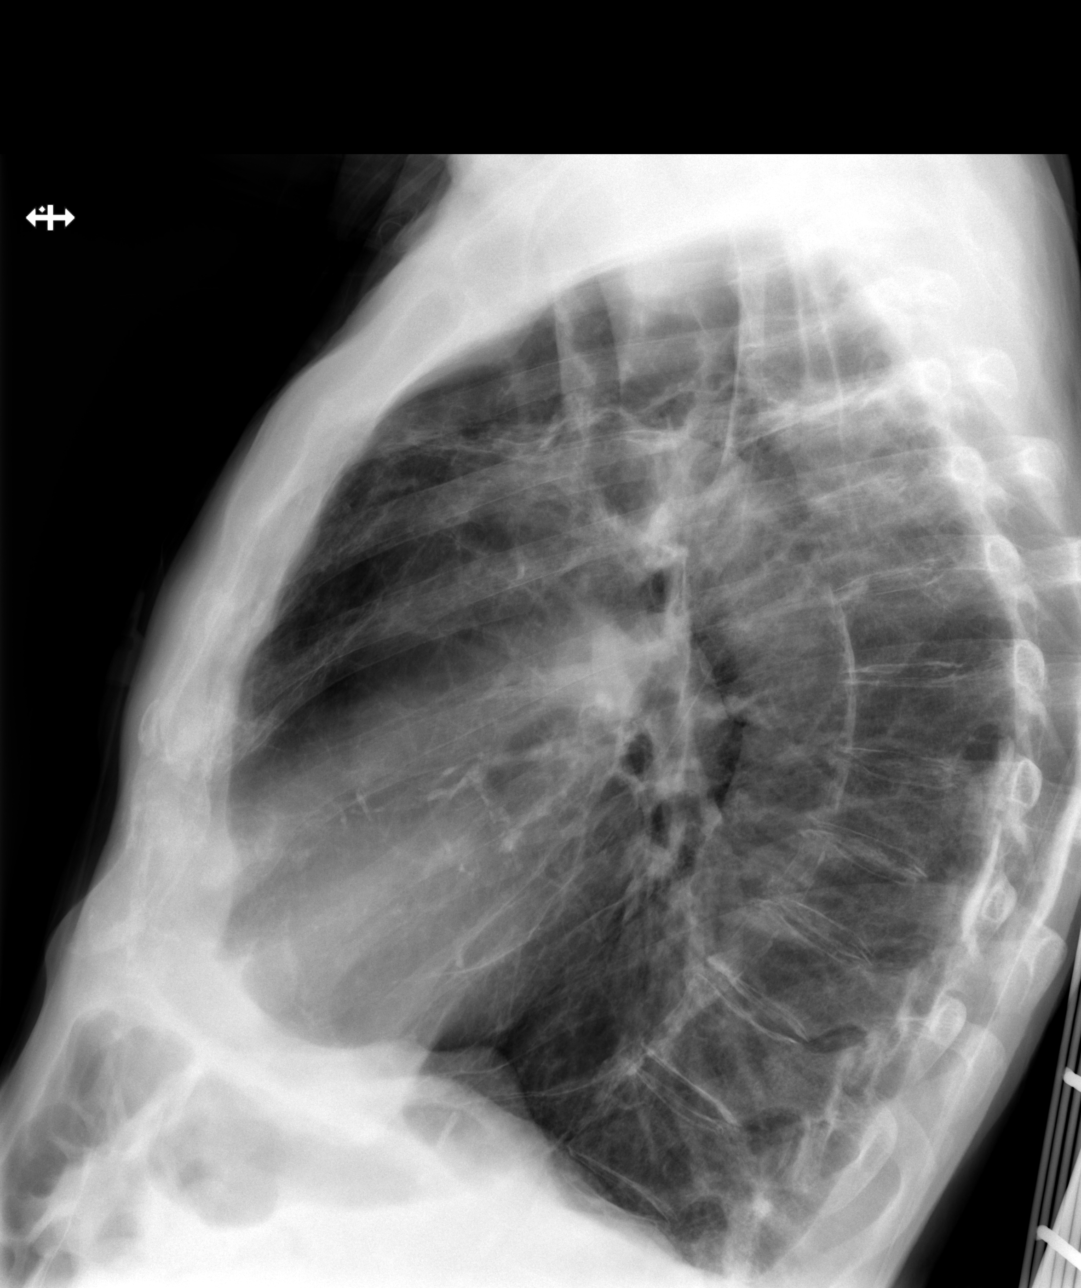

[w chest lat (2 of 2)]
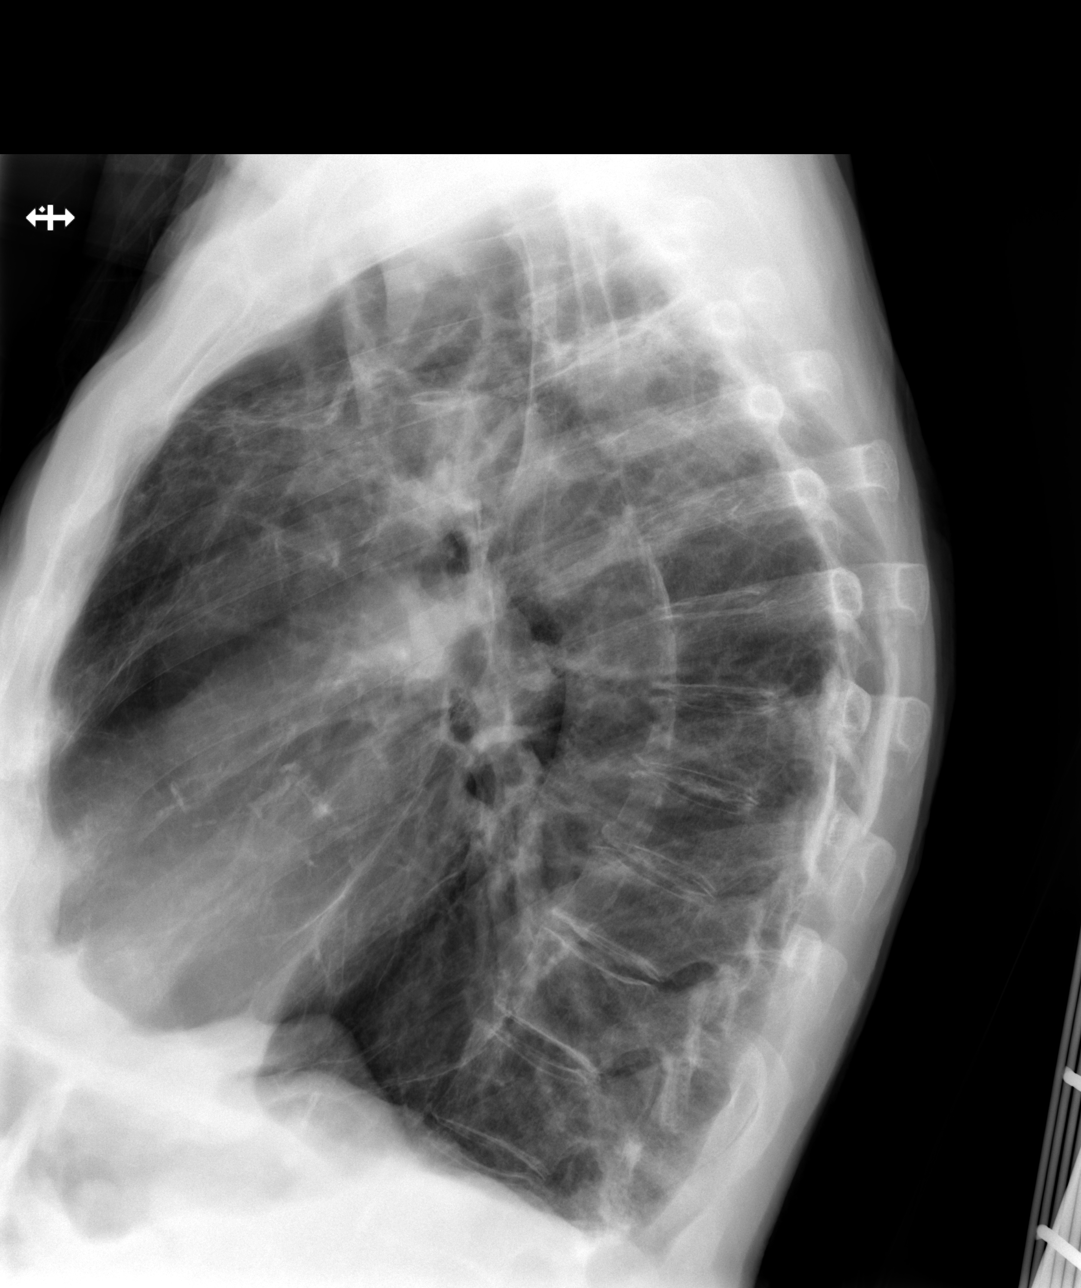

[x chest ap]
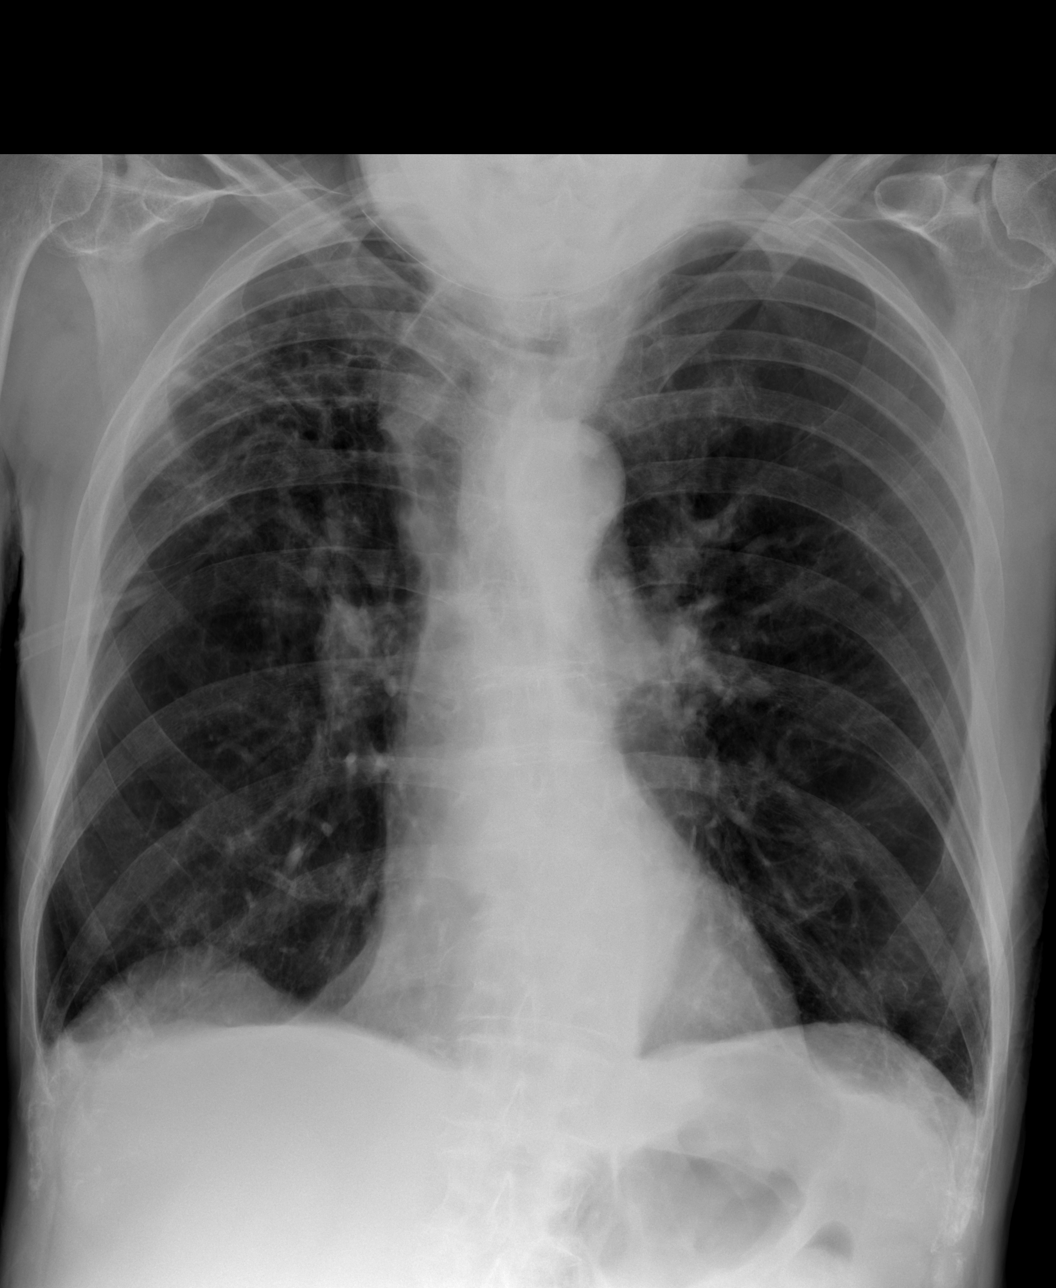

[3 of 3 positions shown; findings below may reference images not displayed]

FINDINGS: Changes of pleural-parenchymal scarring again noted in the right
upper lung. Stable mild bibasal pleural thickening consistent with
scarring. No pneumothorax. Reference made to prior CT report for
discussion of pulmonary present. Heart size normal. No acute bony
abnormality.
IMPRESSION: Changes of pleural-parenchymal scarring again noted in the right
upper lung. No acute abnormality identified.

## 2020-11-22 NOTE — Progress Notes (Signed)
Spoke with daughter, Leveda Anna, [place] per [facility] and notified of results.

## 2020-11-22 NOTE — Telephone Encounter (Signed)
Spoke with the pt's daughter  She states that the pt broke his hip and had replacement Jan 2022 [facility] to [facility] and was recently d/c b/c he was not progressing and is now wheelchair bound She states before he was d/c on 11/17/20, the nurse noticed abnormal breathing and cxr ordered  His cxr showed double pna and he was started on abx  She states he is not improving, having a lot of SOB sometimes even at rest he "pants"  His sats are around 92% on 4lpm cont o2 and drop as low as 70% 4lpm with min exertion  She states he is also wheezing but no cough at this time  No f/c/s Fully vaccinated against covid  Please advise thanks

## 2020-11-22 NOTE — Telephone Encounter (Signed)
Pt stated that her husband was just released from the hospital yesterday and needing an appt informed the pt that a HFU has to be 30 minutes and Dr. Morrison Old first available for RDS was on Apr. 4, pt stated they want to see him today or this week and wanted to speak w/ a nurse. Pls regard [phone]

## 2020-11-22 NOTE — Telephone Encounter (Signed)
Called and spoke with pts wife and she stated that the pt recently came home from rehab at the [facility].  She stated that the pt is on oxygen at 2-3 liters cont.  She stated that last night she helped him to the bathroom and he had to rest several times she finally got him back to the bed and checked his oxygen level and it was around 79% with the oxygen on.  She stated that he started the prednisone and they are concerned about this.  appt scheduled for today in [place] office with MW at 1030

## 2020-11-22 NOTE — Telephone Encounter (Signed)
Patient spouse just called and states patient has pneumonia in both lungs  and oxygen level is low.  He left the [facility] yesterday and is at home now.  She does not want to bring him out.  She will call us back to R/S appt

## 2020-11-22 NOTE — Patient Instructions (Addendum)
Plan A = Automatic = Always=    Trelegy one click each am      Plan B = Backup (to supplement plan A, not to replace it) Only use your albuterol inhaler as a rescue medication to be used if you can't catch your breath by resting or doing a relaxed purse lip breathing pattern.  - The less you use it, the better it will work when you need it. - Ok to use the inhaler up to 2 puffs  every 4 hours if you must but call for appointment if use goes up over your usual need - Don't leave home without it !!  (think of it like the spare tire for your car)   Plan C = Crisis (instead of Plan B but only if Plan B stops working) - only use your albuterol nebulizer if you first try Plan B and it fails to help > ok to use the nebulizer up to every 4 hours but if start needing it regularly call for immediate appointment   Plan D = deltasone if losing ground and needing B and  C - double prednisone until better then 1 daily   For cough/ congestion > mucinex up to 1200 mg every 12 hours and use the flutter as much as possible   Please remember to go to the  x-ray department  @  [facility] for your tests - we will call you with the results when they are available     Adjust 02 to keep saturations above 90%  - if can't get comfortable at rest or can't keep 02 above 90% on max flow at rest then will need to go back to ER    Please schedule a follow up office visit in 4 weeks, sooner if needed

## 2020-11-22 NOTE — Telephone Encounter (Signed)
Please advise on message from [place]

## 2020-11-22 NOTE — Assessment & Plan Note (Signed)
Quit smoking  2010 - daily pred since ? 2010?  - PFT's 12/17/13  FEV1 1.17 (57 % ) ratio 0.36  p 6 % improvement from saba p ? prior to study with DLCO  11.56 (47%) corrects to 2.19 (52%)  for alv volume and FV curve classic exp concavity on exp loop and air trapping - d/c theph 04/14/2020 and max rx for gerd  - 05/26/2020   try change trelegy to breztri as prev preferred symbicort - 06/21/2020 changed back to trelegy at pt request -  11/22/2020   Using proair respiclick as plan B and neb saba as plan C with prednisone 10 mg floor and 20 mg / day ceiling    Group D in terms of symptom/risk and laba/lama/ICS  therefore appropriate rx at this point >>>  Continue trelegy plus prn saba  I spent extra time with pt today reviewing appropriate use of albuterol for prn use on exertion with the following points: 1) saba is for relief of sob that does not improve by walking a slower pace or resting but rather if the pt does not improve after trying this first. 2) If the pt is convinced, as many are, that saba helps recover from activity faster then it's easy to tell if this is the case by re-challenging : ie stop, take the inhaler, then p 5 minutes try the exact same activity (intensity of workload) that just caused the symptoms and see if they are substantially diminished or not after saba 3) if there is an activity that reproducibly causes the symptoms, try the saba 15 min before the activity on alternate days   If in fact the saba really does help, then fine to continue to use it prn but advised may need to look closer at the maintenance regimen being used to achieve better control of airways disease with exertion.

## 2020-11-22 NOTE — Telephone Encounter (Signed)
P[facility]'s- since [facility] can not accommodate the order for o2 that we sent, can you please sent to [facility] who he is currently with and see if they can? What he needs is 5 lpm continuous flow which is stated in the order. Please let triage know if new order needs to be put in. Thank you!

## 2020-11-22 NOTE — Assessment & Plan Note (Addendum)
04/14/2020   Walked RA  approx   300 ft  @ slow pace  stopped due to  End of study with sats  dropping to 88% at end  - 11/07/20  HC03  = 31  - 11/22/2020 rec change 02 to 4-5 lpm no pulsing   Reports of desaturating into 70s on POC up to 5lpm so needs tanks for for now when out from home and titration of 02 to maintain sats > 90% at all times - if can't get comfortable at rest on max flow or sats not > 89% on max 02  flow at rest needs to go to ER / reviewed with daughters          Each maintenance medication was reviewed in detail including emphasizing most importantly the difference between maintenance and prns and under what circumstances the prns are to be triggered using an action plan format where appropriate.  Total time for H and P, chart review, counseling, reviewing dpi device(s) and generating customized AVS unique to this office visit / same day charting for pt d/c from SNF one day prior with acute symptoms since d/c  = 44 min

## 2020-11-22 NOTE — Progress Notes (Signed)
John Black, male    DOB: [birth year]     [name]: [id number]   Brief patient profile:  87 yowm quit smoking in 2010 then major aspiration event? MRSA then dx with GOLD II copd as of 2015 prev followed by Dr Gwenette Greet s/p admit:  Admit date: 02/16/2020 Discharge date: 02/19/2020  Discharge Diagnoses:  Principal Problem:   COPD with acute exacerbation ([place])   Hypothyroidism   BPH (benign prostatic hyperplasia)   Acute on chronic respiratory failure with hypoxia (HCC)   Sepsis ([place])   SOB (shortness of breath)  History of present illness:  87 y.o.malewith medical history significant ofCOPD/emphysema, chronic respiratory failure on home oxygen, hypothyroidism presenting with complaints of shortness of breath.Patient reports 3 to 4-day history of progressively worsening dyspnea. He is also wheezing but not coughing much. Denies chest pain. Denies fevers or chills. States he uses 3 to 4 L supplemental oxygen at home all the time.  He was admitted with fever and shortness of breath.  He was found to have a COPD exacerbation.  He's improved with steroids, abx, and scheduled and prn nebs.  6/4 appeared improved and was discharged with plan for outpatient follow up.  Hospital Course:  Acute on chronic hypoxic respiratory failure, suspect secondary to acute COPD exacerbation: CT PE protocol without PE, no acute intrathoracic pathology, advanced emphysema Discharge with steroid taper, complete course of azithromycin, resume home nebs Ceftriaxone x3 days.  Complete 5 day total course of azithromycin.  Discharged with 2 days azithromycin.  Home theophylline Stable on home oxygen, maintained O2 sats with 4 L  Will need home O2 screen prior to d/c- maintained O2 sats 90% on home 4 L today Negative COVID testing, follow RVP negative  Sinus Tachycardia: unclear etiology? Possibly 2/2 nebs. HR up to 120-130 with ambulation with therapy. - on review of previous outpatient notes, he was  tachycardic to 120 on 01/22/20 visit and to 108 at 09/23/19 visit.  Possible chronic component. Daughter notes his HR usually in 80's-90's on pulse ox at home.  EKG and tele show sinus tach. Normal TSH from admission. Echo with grade II diastolic dysfunction, normal EF (see report) -> follow outpatient.  Mild to moderate Dilatation of the ascending aorta: 39 cm on echo - follow outpatient for surveillance   Febrile Illness: With COPD exacerbation, respiratory distress, suspect he had  viral illness leading to exacerbation.     GERD -Continue PPI  BPH -Continue Flomax  Anemia: suspect component of dilution, repeat H/H  Procedures: Echo IMPRESSIONS    1. Left ventricular ejection fraction, by estimation, is 60 to 65%. The  left ventricle has normal function. The left ventricle has no regional  wall motion abnormalities. Left ventricular diastolic parameters are  consistent with Grade II diastolic  dysfunction (pseudonormalization).  2. Right ventricular systolic function is normal. The right ventricular  size is normal.  3. The mitral valve is normal in structure. Mild mitral valve  regurgitation. No evidence of mitral stenosis.  4. The aortic valve is normal in structure. Aortic valve regurgitation is  not visualized. No aortic stenosis is present.  5. Aortic dilatation noted. There is mild to moderate dilatation of the  ascending aorta measuring 39 mm.        History of Present Illness  04/14/2020  Pulmonary/ 1st office eval/ Gladie Gravette / [name] / trelegy / prednisone 10  X years  Chief Complaint  Patient presents with  . Consult    shortness of breath  with exertion  Dyspnea:  MMRC3 = can't walk 100 yards even at a slow pace at a flat grade s stopping due to sob - uses w/c a lot Cough: hoarse, some choking with swallowing  Sleep: bed is flat couple of pillows SABA use: neb in am 02 4 lpm hs and 3-4 lpm with sitting/ walking  rec Stop uniphyl  Continue  protonix Take 30-60 min before first meal of the day  GERD  Diet  If breathing gets worse double the prednisone until better then back to 10 mg daily  Make sure you check your oxygen saturations at highest level of activity to be sure it stays over 90% and adjust upward to maintain this level if needed but remember to turn it back to previous settings when you stop (to conserve your supply).  Please schedule a follow up office visit in 6 weeks, call sooner if needed - bring inhalers         05/26/2020  f/u ov/[place] office/Kaidan Spengler re:  GOLD 3/ 02 dep on trelegy / preferred symbicort/ maint pred at 10 mg daily  Chief Complaint  Patient presents with  . Follow-up    shortness of breath with activity  Dyspnea: weak and sob limited to  room to room at home with cane   Cough: none  Sleeping: bed is flat/ 2 pillows SABA use: nebulizer is being using as backup frequently  02: up to 5lpm  rec Plan A = Automatic = Always=    Breztri Take 2 puffs first thing in am and then another 2 puffs about 12 hours later.  Work on inhaler technique:   Plan B = Backup (to supplement plan A, not to replace it) Only use your albuterol inhaler as a rescue medication   Plan C = Crisis (instead of Plan B but only if Plan B stops working) - only use your albuterol nebulizer if you first try Plan B and it fails to help > ok to use the nebulizer up to every 4 hours but if start needing it regularly call for immediate appointment Plan D = deltasone if losing ground and needing B and  C - double prednisone until better then 1 daily      06/21/2020  f/u ov/[place] office/Clemons Salvucci re:  GOLD III / 02 dep on breztri/ pred 10 mg Chief Complaint  Patient presents with  . Acute Visit  Dyspnea:  Room to room  Cough: more copious/ white / not using max mucinex/ no using flutter as rec  Sleeping: flat bed two pillows SABA use: not using hfa/ neb not understanding when to use  Vs prednisone 20 as plan D above  02: 3-5 lpm  titrates  rec Plan A = Automatic = Always=    Trelegy one click each am    Plan B = Backup (to supplement plan A, not to replace it) Only use your albuterol inhaler as a rescue medication Plan C = Crisis (instead of Plan B but only if Plan B stops working) - only use your albuterol nebulizer if you first try Plan B  Plan D = deltasone if losing ground and needing B and  C - double prednisone until better then 1 daily  For cough/ congestion > mucinex up to 1200 mg  twice and use the flutter as much as possible        08/29/2020  f/u ov/[place] office/Halona Amstutz re:  COPD with GOLD III   spirometry / 02 dep @  24/7 and  prednisone 10 mg daily  X years  Chief Complaint  Patient presents with  . Follow-up    Chest congestion in past week  Dyspnea:   Room to room at home on 4lpm  Cough: none Sleeping: flat bed with 2 pillows  SABA use: very little  02: 4lpm 24/7  rec Plan A = Automatic = Always=    Trelegy one click each am   Plan B = Backup (to supplement plan A, not to replace it) Only use your albuterol inhaler as a rescue medication Plan C = Crisis (instead of Plan B but only if Plan B stops working) - only use your albuterol nebulizer if you first try Plan B and it fails to help > ok to use the nebulizer up to every 4 hours but if start needing it regularly call for immediate appointment Plan D = deltasone if losing ground and needing B and  C - double prednisone until better then 1 daily  For cough/ congestion > mucinex up to 1200 mg twice and use the flutter as much as possible     11/22/20 PC Spoke with the pt's daughter  She states that the pt broke his hip and had replacement Jan 2022 Martin Majestic to [place] d/c 11/21/20  b/c he was not progressing and is now wheelchair bound She states before he was d/c  the nurse noticed abnormal breathing and cxr ordered  His cxr showed double pna and he was started on Avelox  11/18/18  She states he is not improving, having a lot of SOB sometimes  even at rest he "pants"  His sats are around 92% on 4lpm cont o2 and drop as low as 70% 4lpm with min exertion  She states he is also wheezing but no cough at this time  No f/c/s Fully vaccinated against covid   11/22/2020  Acute  ov/[place] office/Madalyn Legner re: copd gold III but 02 and  pred dep at 10 mg daily  - increased to 2 daily as of am 3/8  Chief Complaint  Patient presents with  . Acute Visit    Dx with PNA 11/17/20- SOB and wheezing. Sats have been dropping into 70's on 4lpm o2 with exertion.   Dyspnea:  2 person assist to get oob and into w/c then to recliner  Cough: none  Sleeping: flat / two pillows  SABA use: none on day of ov or prior noct despite reports of "struggling to breath 02: concentrator  4lpm and inogen POC up to 5lpm pulsed / poor insight into titration principles / not using mucinex or flutter valve    No obvious day to day or daytime variability or assoc excess/ purulent sputum or mucus plugs or hemoptysis or cp or chest tightness, subjective wheeze or overt sinus or hb symptoms.     Also denies any obvious fluctuation of symptoms with weather or environmental changes or other aggravating or alleviating factors except as outlined above   No unusual exposure hx or h/o childhood pna/ asthma or knowledge of premature birth.  Current Allergies, Complete Past Medical History, Past Surgical History, Family History, and Social History were reviewed in [employer] record.  ROS  The following are not active complaints unless bolded Hoarseness, sore throat, dysphagia, dental problems, itching, sneezing,  nasal congestion or discharge of excess mucus or purulent secretions, ear ache,   fever, chills, sweats, unintended wt loss or wt gain, classically pleuritic or exertional cp,  orthopnea pnd or arm/hand swelling  Or foot swelling, presyncope, palpitations, abdominal pain, anorexia, nausea, vomiting, diarrhea  or change in bowel habits or change in bladder  habits, change in stools or change in urine, dysuria, hematuria,  rash, arthralgias, visual complaints, headache, numbness, weakness or ataxia or problems with walking or coordination,  change in mood or  memory.        Current Meds  Medication Sig  . acetaminophen (TYLENOL) 650 MG CR tablet Take 650 mg by mouth 3 (three) times daily.  Marland Kitchen albuterol (ACCUNEB) 0.63 MG/3ML nebulizer solution Take 1 ampule by nebulization every 4 (four) hours as needed for wheezing.  . Albuterol Sulfate (PROAIR RESPICLICK) 330 (90 Base) MCG/ACT AEPB Inhale 2 puffs into the lungs every 4 (four) hours as needed.  Marland Kitchen aspirin EC 81 MG tablet Take 81 mg by mouth. Twice a day from 10/12/2020-11/22/2020 Then take once a day starting 11/23/2020  . Cholecalciferol 1.25 MG (50000 UT) capsule Take 50,000 Units by mouth once a week. On Wednesday  . Ensure (ENSURE) Take 237 mLs by mouth daily.  . Fluticasone-Umeclidin-Vilant (TRELEGY ELLIPTA) 100-62.5-25 MCG/INH AEPB Inhale 1 puff into the lungs daily.  Marland Kitchen guaiFENesin (MUCINEX) 600 MG 12 hr tablet Take 600 mg by mouth 2 (two) times daily as needed for cough.  . levothyroxine (SYNTHROID) 75 MCG tablet TAKE 1 TABLET (75 MCG TOTAL) BY MOUTH DAILY.  . melatonin 1 MG TABS tablet Take 1 mg by mouth at bedtime.  . mirtazapine (REMERON) 15 MG tablet TAKE 1/2 TABLET BY MOUTH IN THE EVENING  . moxifloxacin (AVELOX) 400 MG tablet Take 400 mg by mouth daily at 8 pm.  . NON FORMULARY Diet: _____ Regular, ___x___ NAS, _______Consistent Carbohydrate, _______NPO _____Other  . OXYGEN Inhale 4 L into the lungs continuous.  . pantoprazole (PROTONIX) 40 MG tablet Take 1 tablet (40 mg total) by mouth 2 (two) times daily.  . polyvinyl alcohol (LIQUIFILM TEARS) 1.4 % ophthalmic solution Place 1 drop into both eyes 3 (three) times daily as needed for dry eyes.  . predniSONE (DELTASONE) 10 MG tablet Take 1 tablet (10 mg total) by mouth daily with breakfast. HOLD while on Prednisone taper  . tamsulosin  (FLOMAX) 0.4 MG CAPS capsule TAKE 1 CAPSULE BY MOUTH EVERY DAY        Past Medical History:  Diagnosis Date  . Allergic rhinitis   . Cataract    s/p removal  . Chronic respiratory failure (HCC)    oxygen 3L at home  . Emphysema   . Hypothyroid   . PNA (pneumonia)   . Prostate cancer ([place])        Objective:    11/22/2020         122 08/29/2020     124   06/21/2020       115  05/26/20 112 lb 3.2 oz (50.9 kg)  04/14/20 112 lb 12.8 oz (51.2 kg)    Vital signs reviewed  11/22/2020  - Note at rest 02 sats  92% on 5lpm POC    General appearance:  Chronically but not acutely ill appearing w/c bound wm nad   HEENT : pt wearing mask not removed for exam due to covid -19 concerns.    NECK :  without JVD/Nodes/TM/ nl carotid upstrokes bilaterally   LUNGS: no acc muscle use,  Mod barrel  contour chest wall with bilateral  Distant bs s audible wheeze and  without cough on insp or exp maneuvers and mod  Hyperresonant  to  percussion bilaterally  CV:  RRR  no s3 or murmur or increase in P2, and no edema   ABD:  soft and nontender with pos mid insp Hoover's  in the supine position. No bruits or organomegaly appreciated, bowel sounds nl  MS:     ext warm without deformities, calf tenderness, cyanosis or clubbing No obvious joint restrictions   SKIN: warm and dry without lesions    NEURO:  alert, approp, nl sensorium with  no motor or cerebellar deficits apparent.         I personally reviewed images and agree with radiology impression as follows:   Chest CTa  10/20/20  1. No acute intrathoracic pathology. No CT evidence of pulmonary artery embolus. 2. Severe emphysema with right upper lobe scarring and architectural distortion. 3. A 6 mm left lower lobe nodule. Non-contrast chest CT at 6-12 months is recommended.    CXR PA and Lateral:   11/22/2020 :    I personally reviewed images and   impression as follows:   Severe copd / scarring RUL/ no acute changes or as dz       Assessment

## 2020-11-22 NOTE — Telephone Encounter (Signed)
I spoke with the patient and she reports that she is keeping him home for now and she will call back and re-schedule another day to make a follow up.   Patient wife reports that he is on a double dose of prednisone.   She will call back to set up a follow up. She reports that his oxygen had got down to 75 on continuous oxygen. It has come above 90 now.

## 2020-11-23 ENCOUNTER — Encounter: Payer: PPO | Admitting: Family Medicine

## 2020-11-23 ENCOUNTER — Telehealth: Payer: Self-pay

## 2020-11-23 NOTE — Telephone Encounter (Signed)
Janna Arch called from [facility] needs a verbal order for patient. Contact #[phone]

## 2020-11-23 NOTE — Telephone Encounter (Addendum)
Order faxed to [facility] rec'd fax confirm.

## 2020-11-23 NOTE — Progress Notes (Unsigned)
Subjective:   Patient presents for Medicare Annual/Subsequent preventive examination.  Patient here for Medicare wellness.  Medications reviewed.  COPD oxygen dependent has been his major issue.  Is following with pulmonary.  He had a CT scan done in January which showed emphysematous changes and some chronic calcifications.  He had recent pneumonia with COPD flare in December.  O2 is typically about 4 L at rest up to 5 L with activity.  He is still on Trelegy and his albuterol/ theophylline / prednisone 10mg  daily   Since our last visit he was admitted to the hospital for sustaining a fall resulting in fracture of his right hip.  He did go to nursing facility for rehab.  Dr. Gwenlyn Saran this is orthopedic.  He was discharged from rehab.  He was diagnosed with pneumonia last week and was given Avelox 400 mg daily along with his regular COPD medications and Mucinex twice a day.  History of ischemic attack he had imaging that showed evidence of chronic infarcts.  Referred to neurology for follow-up from hospital visit.  He was started on Zetia in the hospital but we made a decision to discontinue the Zetia at the last visit.  He has not tolerated statins in the past.  Overall mobility and function has declined significantly.  The malnutrition he has been maintained on mirtazapine  Hypothyroidism he continues on thyroid supplement    Review Past Medical/Family/Social: per EMR    Risk Factors  Current exercise habits: No exercise program  Dietary issues discussed: Protein malnutrition   Cardiac risk factors: COPD/Emphysema  , Hyperlipidemia   Depression Screen  (Note: if answer to either of the following is "Yes", a more complete depression screening is indicated)  Over the past two weeks, have you felt down, depressed or hopeless? No Over the past two weeks, have you felt little interest or pleasure in doing things? No Have you lost interest or pleasure in daily life? No Do you often feel  hopeless? No Do you cry easily over simple problems? No   Activities of Daily Living  In your present state of health, do you have any difficulty performing the following activities?:  Driving?Yes  Managing money? No  Feeding yourself? No  Getting from bed to chair? No  Climbing a flight of stairs? Yes  Preparing food and eating?: Yes- preparing food  Bathing or showering?  Yes- difficulty standing in shower, gets to winded   Getting dressed: No  Getting to the toilet? No  Using the toilet:No  Moving around from place to place: some times due to breathing   In the past year have you fallen or had a near fall?:yes  Are you sexually active? No  Do you have more than one partner? No   Hearing Difficulties: Yes  Do you often ask people to speak up or repeat themselves?  Yes  Do you experience ringing or noises in your ears? No Do you have difficulty understanding soft or whispered voices? No  Do you feel that you have a problem with memory? No Do you often misplace items? No  Do you feel safe at home? Yes  Cognitive Testing  Alert? Yes Normal Appearance?Yes  Oriented to person? Yes Place? Yes  Time? Yes  Recall of three objects? Yes  Can perform simple calculations? Yes  Displays appropriate judgment?Yes  Can read the correct time from a watch face?Yes   List the Names of Other Physician/Practitioners you currently use:   Pulmonary  Dermatology  Urology  prn  opthalology  Neurology  Screening Tests / Date Colonoscopy over max age                    Pneumonia UTD Influenza Vaccine  UTD  COVID-19 vaccine completed   ROS: GEN- denies fatigue, fever, weight loss,weakness, recent illness HEENT- denies eye drainage, change in vision, nasal discharge, CVS- denies chest pain, palpitations RESP- denies SOB, cough, wheeze ABD- denies N/V, change in stools, abd pain GU- denies dysuria, hematuria, dribbling, incontinence MSK- denies joint pain, muscle aches,  injury Neuro- denies headache, dizziness, syncope, seizure activity  Physical: Vitals reviewed ,sitting in wheelchair GEN- NAD, alert and oriented x3 , elderly male - CHRONICALLY ill appearing, 4 L oxygen  HEENT- PERRL, EOMI, non injected sclera, pink conjunctiva, MMM, oropharynx clear Neck- Supple, no thryomegaly CVS- mild tachycardia HR 100 , no murmur RESP-CTAB ABD-NABS,soft,nt,ND EXT- No edema Pulses- Radial, DP- 2+   Assessment:    Annual wellness medicare exam   Plan:    During the course of the visit the patient was educated and counseled about appropriate screening and preventive services including:  CAGE/DEPRESSION SCREEN NEGATIVE   IMMUNIZATIONS- UTD  COPD/emphysema- followed by pulmonary/ + recent exacerbations, high oxygen dependency   Mild hyperlipidemia due for recheck today.  No current medications  Protein calorie malnutrition.  .  Continue remeron  He is drinking Ensure with each meal.  Chronic insomnia - taken off ambien in the hospital after fall  Gait instability generalized weakness unfortunately his health has been declining the setting of his severe pulmonary disease.  Shower chair as needed to assist with ADLs  Chronic pain/OA occ use of ultram   Constipation.continue miralax  History of prostate cancer no longer checking PSA   Diet review for nutrition referral? Yes ____ Not Indicated __x__  Patient Instructions (the written plan) was given to the patient.  Medicare Attestation  I have personally reviewed:  The patient's medical and social history  Their use of alcohol, tobacco or illicit drugs  Their current medications and supplements  The patient's functional ability including ADLs,fall risks, home safety risks, cognitive, and hearing and visual impairment  Diet and physical activities  Evidence for depression or mood disorders  The patient's weight, height, BMI, and visual acuity have been recorded in the chart. I have  made referrals, counseling, and provided education to the patient based on review of the above and I have provided the patient with a written personalized care plan for preventive services.

## 2020-11-23 NOTE — Telephone Encounter (Signed)
[facility] for John Black.

## 2020-11-24 NOTE — Telephone Encounter (Signed)
Call placed to patient. LMTRC.  

## 2020-11-25 ENCOUNTER — Telehealth: Payer: Self-pay | Admitting: Pharmacist

## 2020-11-25 NOTE — Telephone Encounter (Signed)
Multiple calls placed to  [facility] with no answer and no return call.   Message to be closed.

## 2020-11-25 NOTE — Progress Notes (Addendum)
Chronic Care Management Pharmacy Assistant   Name: John Black  [name]: [id number] DOB: Apr 09, 1929  Reason for Encounter: Disease State For COPD.   Conditions to be addressed/monitored: allergic rhinitis, COPD, chronic diarrhea, hypothyroidism, BPH, mild hyperlipidemia.  Recent office visits: None since 10/21/20  Recent consult visits:  11/22/20 Pulmonology John Rockers, MD. John Black preformed. CHANGED mucinex up to 1200 mg every 12 hours and use the flutter. 11/18/20 John Fee, NP. For Pneumonia. CHANGED mucinex to 600 mg twice daily   11/17/20  John Fee, NP. For COPD. Xray performed. No medication changes. 11/07/20 John Fee, NP. For healing on right hip.CHANGED Acetaminophen to 650 mg three times daily 10/31/20 John Fee, NP.For healing on right hip. No medication changes.  10/28/20 John Limes, MD.Syncope and Collapse. For healing on right hip.STOPPED Lorazepam 10/27/20 John Fee, NP.For healing on right hip. STOPPED ativan.  10/25/20 Ortho/Sports John Rasmussen, MD. For pain in right hip. X-ray performed. No medication changes. 10/24/20 John Fee, NP. Hospital Follow up. No medication changes. 2/522-11/21/20 Nursing home / [facility]. John Black, [place] [facility] visits: None since 10/21/20  Medications: Outpatient Encounter Medications as of 11/25/2020  Medication Sig   acetaminophen (TYLENOL) 650 MG CR tablet Take 650 mg by mouth 3 (three) times daily.   albuterol (ACCUNEB) 0.63 MG/3ML nebulizer solution Take 1 ampule by nebulization every 4 (four) hours as needed for wheezing.   Albuterol Sulfate (PROAIR RESPICLICK) 154 (90 Base) MCG/ACT AEPB Inhale 2 puffs into the lungs every 4 (four) hours as needed.   aspirin EC 81 MG tablet Take 81 mg by mouth. Twice a day from 10/12/2020-11/22/2020 Then take once a day starting 11/23/2020   Cholecalciferol 1.25 MG (50000 UT) capsule Take 50,000 Units by mouth once a week. On Wednesday    Ensure (ENSURE) Take 237 mLs by mouth daily.   Fluticasone-Umeclidin-Vilant (TRELEGY ELLIPTA) 100-62.5-25 MCG/INH AEPB Inhale 1 puff into the lungs daily.   furosemide (LASIX) 20 MG tablet Take 20 mg by mouth daily. (Patient not taking: Reported on 11/22/2020)   guaiFENesin (MUCINEX) 600 MG 12 hr tablet Take 600 mg by mouth 2 (two) times daily as needed for cough.   levothyroxine (SYNTHROID) 75 MCG tablet TAKE 1 TABLET (75 MCG TOTAL) BY MOUTH DAILY.   melatonin 1 MG TABS tablet Take 1 mg by mouth at bedtime.   mirtazapine (REMERON) 15 MG tablet TAKE 1/2 TABLET BY MOUTH IN THE EVENING   NON FORMULARY Diet: _____ Regular, ___x___ NAS, _______Consistent Carbohydrate, _______NPO _____Other   OXYGEN Inhale 4 L into the lungs continuous.   pantoprazole (PROTONIX) 40 MG tablet Take 1 tablet (40 mg total) by mouth 2 (two) times daily.   polyvinyl alcohol (LIQUIFILM TEARS) 1.4 % ophthalmic solution Place 1 drop into both eyes 3 (three) times daily as needed for dry eyes.   predniSONE (DELTASONE) 10 MG tablet Take 1 tablet (10 mg total) by mouth daily with breakfast. HOLD while on Prednisone taper   tamsulosin (FLOMAX) 0.4 MG CAPS capsule TAKE 1 CAPSULE BY MOUTH EVERY DAY   No facility-administered encounter medications on file as of 11/25/2020.   Current COPD regimen: Trelegy Ellipta 100-62.5-25 MCG/INH. 1 puff daily. Oxygen 4L All day, All night.  Albuterol 0.63 MG/3 ML 1 ampule by nebulization every 4 hours as needed for wheezing   No flowsheet data found.   Any recent hospitalizations or ED visits since last visit with CPP? Patients wife stated  no.   Patients wife reports COPD symptoms, including Symptoms worse with exercise and Wheezing   What recent interventions/DTPs have been made by any provider to improve breathing since last visit: None.  Have you had exacerbation/flare-up since last visit? Patients wife stated Yes from having pneumonia.    What do you do when you are short of  breath?  Adhere to COPD Action Plan and Rest  Respiratory Devices/Equipment Do you have a nebulizer? Patients wife stated Yes, and he uses it sometimes.  Do you use a Peak Flow Meter? Patients wife stated No   Do you use a maintenance inhaler? Patient wife stated Yes   How often do you forget to use your daily inhaler? Patients wife stated never.  Do you use a rescue inhaler? Patients wife stated No   How often do you use your rescue inhaler?  N/A  Do you use a spacer with your inhaler? Patients wife stated no.   Adherence Review: Does the patient have >5 day gap between last estimated fill date for maintenance inhaler medications? Per misc rpts, No.  Patients wife stated he is at home now from the nursing home, they have been home for about three days. She stated he does physical therapy, occupational therapy, and a nurse comes in to check on him. She stated his oxygen does go down sometimes when he exercises so they turn it up to 5L when he's exercising . Patients wife stated she does not have any questions or concerns at this time. She stated his mirtazapine is helping him with his weight gain and It helps him fall asleep  Star Rating Drugs: None.  Follow-Up: Pharmacist Review  John Black, RMA Clinical Pharmacist Assistant [phone]  10 minutes spent in review, coordination, and documentation.  Reviewed by: John Black, PharmD Clinical Pharmacist [facility] [phone]

## 2020-11-28 ENCOUNTER — Telehealth: Payer: Self-pay | Admitting: *Deleted

## 2020-11-28 NOTE — Telephone Encounter (Signed)
Received call from [place], [facility] SN with [facility].   Requested VO for HH SN 1x weekly x4 weeks, and 3 PRN visits for COPD education and medication education. VO given.   Also reports that Remo Lipps, [facility] PT requested orders for 2x weekly x3 weeks, then 1x weekly x4 weeks for strengthening, transfers, ambulation, and caregiver education. VO given.

## 2020-11-28 NOTE — Telephone Encounter (Signed)
Also received call from [place], [facility] OT with [facility]. Requested VO to extend [facility] PT 2x weekly x3 weeks, then 1x weekly x1 week for strengthening, balance and endurance. VO given.

## 2020-11-30 ENCOUNTER — Telehealth: Payer: Self-pay | Admitting: Internal Medicine

## 2020-11-30 DIAGNOSIS — J9611 Chronic respiratory failure with hypoxia: Secondary | ICD-10-CM

## 2020-11-30 NOTE — Telephone Encounter (Signed)
Fine with me if we have an updated qualification they will accept, if not needs a best fit eval by dme or qualify here

## 2020-11-30 NOTE — Telephone Encounter (Signed)
Called and spoke with [place]. She is aware of MW recommendations. Advised her that I would go ahead and place the order for the [employer] device.   Nothing further needed at time of call.

## 2020-11-30 NOTE — Telephone Encounter (Signed)
Spoke with the pt's daughter Enid Derry  She spoke with [employer] and they have a POC that goes up to 6lpm pulsed ( G 5) She is asking if we can send order for this

## 2020-12-01 ENCOUNTER — Telehealth: Payer: Self-pay | Admitting: Internal Medicine

## 2020-12-01 NOTE — Telephone Encounter (Signed)
We having been having issues with the fax number we have faxed it to another number and also faxed it to this number listed

## 2020-12-01 NOTE — Telephone Encounter (Signed)
Pt wife just stopped in at the [place] office and stated that someone was going to fax and order for inogen to Fax [phone] Attn lauren She spoke with Ander Purpura and they did not get it yesterday Please resend to [phone] Judson Roch  Lauren's phone number is [phone]  order should be for   G5 liters per min pulse  Wife was told it was sent at 1 pm on 3/16.  Please call wife if you have any questions

## 2020-12-01 NOTE — Telephone Encounter (Signed)
Called and let pt know that we got confirmation that the fax went though

## 2020-12-12 ENCOUNTER — Telehealth: Payer: Self-pay | Admitting: Internal Medicine

## 2020-12-12 DIAGNOSIS — J9611 Chronic respiratory failure with hypoxia: Secondary | ICD-10-CM

## 2020-12-12 NOTE — Telephone Encounter (Signed)
Called and spoke with Theresia Bough regarding oxygen needs.  Patient is going to see his new pcp on Thursday of this week for his post rehab exit visit.  He is currently on 6L of pulsed oxygen and it is not doing the job.  She states he needs continuous oxygen flow.  She spoke with [place] and was told she could rent tanks to take him to his doctors appointments, he has appointments upcoming and will need to rent oxygen for 3 different days.  She will need to pick up the oxygen on Wednesday evening.  Dr. Melvyn Novas, Please advise if ok to send script to [facility] so patient can rent oxygen tanks to take to doctors appointments that provide continuous oxygen flow.  Thank you.

## 2020-12-12 NOTE — Telephone Encounter (Signed)
Ok with me 

## 2020-12-12 NOTE — Telephone Encounter (Signed)
Called and spoke with patient's wife. She is aware that we will place the order to [facility]. She will contact [employer] to let them know that he will have to go back to [place].   Nothing further needed at time of call.

## 2020-12-13 ENCOUNTER — Other Ambulatory Visit: Payer: Self-pay | Admitting: Adult Health

## 2020-12-14 ENCOUNTER — Telehealth: Payer: Self-pay | Admitting: Internal Medicine

## 2020-12-14 NOTE — Telephone Encounter (Signed)
Nope I havent got anything on him

## 2020-12-14 NOTE — Telephone Encounter (Signed)
Located form in MW's box. This has been signed and faxed to [place] for pt. Called and spoke with pt's wife Jaci Standard letting her know this had been done and she verbalized understanding. Nothing further needed.

## 2020-12-14 NOTE — Telephone Encounter (Signed)
Located paperwork faxed to office and given to [facility], Dr. Gustavus Bryant nurse. Pt's wife informed that papers would be signed and faxed in the morning. Nothing further needed at this time.

## 2020-12-14 NOTE — Telephone Encounter (Signed)
John Black, please advise if you have received anything from [facility].

## 2020-12-15 ENCOUNTER — Encounter: Payer: Self-pay | Admitting: Family Medicine

## 2020-12-15 ENCOUNTER — Other Ambulatory Visit: Payer: Self-pay

## 2020-12-15 ENCOUNTER — Ambulatory Visit (INDEPENDENT_AMBULATORY_CARE_PROVIDER_SITE_OTHER): Payer: PPO | Admitting: Family Medicine

## 2020-12-15 ENCOUNTER — Telehealth: Payer: Self-pay | Admitting: *Deleted

## 2020-12-15 VITALS — BP 132/68 | HR 112 | Temp 98.1°F | Resp 18

## 2020-12-15 DIAGNOSIS — I499 Cardiac arrhythmia, unspecified: Secondary | ICD-10-CM

## 2020-12-15 NOTE — Telephone Encounter (Signed)
Received call from [place], [facility] SN with [facility].   Reports that [facility] SN was to discharge on 12/16/2020, but she does not feel that patient is at goal.   Requested VO to extend [facility] SN 1x weekly 3 weeks. VO given.

## 2020-12-15 NOTE — Progress Notes (Signed)
Subjective:    Patient ID: John Black, male    DOB: September 26, 1928, 87 y.o.   [name]: [id number]  HPI   Patient is a very pleasant 87 year old Caucasian male here today with his wife as well as his daughter.  He was previously seeing my partner who is since retired from family medicine.  He has chronic O2 dependent respiratory failure.  He is on 5 L of oxygen and this is his new baseline.  He also has a history of emphysema for which he is on Trelegy.  Patient fell and broke his right hip.  Ever since surgery he has noticed bipedal edema.  It was even worse when he was in short-term rehab at the [facility].  It has improved since being started on Lasix by his pulmonologist.  However he also reports increasing dyspnea on exertion.  He is sedentary and confined to his wheelchair.  However his oxygen drops precipitously with minimal activity which is a change from his baseline.  Today, the patient has +1 bipedal edema that stops at his ankles.  He has a negative Homans' sign and no edema in his lower legs beyond his ankle.  It is symmetric bilaterally.  He also has crackles anteriorly in the right upper lung.  He has very little air movement posteriorly that I can appreciate.  However he is tachycardic with an irregular rhythm.   Past Medical History:  Diagnosis Date  . Allergic rhinitis   . Cataract    s/p removal  . Chronic respiratory failure (HCC)    oxygen 3L at home  . Emphysema   . Hypothyroid   . PNA (pneumonia)   . Prostate cancer [facility])    Past Surgical History:  Procedure Laterality Date  . ADENOIDECTOMY    . HIP ARTHROPLASTY Right 10/10/2020   Procedure: ARTHROPLASTY BIPOLAR HIP (HEMIARTHROPLASTY);  Surgeon: Mordecai Rasmussen, MD;  Location: [facility];  Service: Orthopedics;  Laterality: Right;  . ROTATOR CUFF REPAIR  1990,2009   bilateral  . Seed implant for prostate cancer  2000  . TONSILLECTOMY    . TRANSURETHRAL RESECTION OF PROSTATE  2001   x2   Current Outpatient Medications  on File Prior to Visit  Medication Sig Dispense Refill  . acetaminophen (TYLENOL) 650 MG CR tablet Take 650 mg by mouth 3 (three) times daily.    Marland Kitchen albuterol (ACCUNEB) 0.63 MG/3ML nebulizer solution Take 1 ampule by nebulization every 4 (four) hours as needed for wheezing.    . Albuterol Sulfate (PROAIR RESPICLICK) 259 (90 Base) MCG/ACT AEPB Inhale 2 puffs into the lungs every 4 (four) hours as needed.    Marland Kitchen aspirin EC 81 MG tablet Take 81 mg by mouth. Twice a day from 10/12/2020-11/22/2020 Then take once a day starting 11/23/2020    . Cholecalciferol 1.25 MG (50000 UT) capsule Take 50,000 Units by mouth once a week. On Wednesday    . Ensure (ENSURE) Take 237 mLs by mouth daily.    . Fluticasone-Umeclidin-Vilant (TRELEGY ELLIPTA) 100-62.5-25 MCG/INH AEPB Inhale 1 puff into the lungs daily. 60 each 11  . furosemide (LASIX) 20 MG tablet Take 20 mg by mouth daily.    Marland Kitchen guaiFENesin (MUCINEX) 600 MG 12 hr tablet Take 600 mg by mouth 2 (two) times daily as needed for cough.    . levothyroxine (SYNTHROID) 75 MCG tablet TAKE 1 TABLET (75 MCG TOTAL) BY MOUTH DAILY. 90 tablet 3  . melatonin 1 MG TABS tablet Take 1 mg by mouth at bedtime.    Marland Kitchen  mirtazapine (REMERON) 15 MG tablet TAKE 1/2 TABLET BY MOUTH IN THE EVENING 45 tablet 2  . NON FORMULARY Diet: _____ Regular, ___x___ NAS, _______Consistent Carbohydrate, _______NPO _____Other    . OXYGEN Inhale 4 L into the lungs continuous.    . pantoprazole (PROTONIX) 40 MG tablet Take 1 tablet (40 mg total) by mouth 2 (two) times daily. 90 tablet 1  . polyvinyl alcohol (LIQUIFILM TEARS) 1.4 % ophthalmic solution Place 1 drop into both eyes 3 (three) times daily as needed for dry eyes.    . predniSONE (DELTASONE) 10 MG tablet Take 1 tablet (10 mg total) by mouth daily with breakfast. HOLD while on Prednisone taper    . tamsulosin (FLOMAX) 0.4 MG CAPS capsule TAKE 1 CAPSULE BY MOUTH EVERY DAY 90 capsule 3   No current facility-administered medications on file prior to  visit.   Allergies  Allergen Reactions  . Morphine Other (See Comments)    REACTION: sweats   Social History   Socioeconomic History  . Marital status: Married    Spouse name: Not on file  . Number of children: Not on file  . Years of education: Not on file  . Highest education level: Not on file  Occupational History  . Occupation: Chief Financial Officer  Tobacco Use  . Smoking status: Former Smoker    Packs/day: 1.50    Years: 50.00    Pack years: 75.00    Types: Cigarettes    Quit date: 09/17/2008    Years since quitting: 12.2  . Smokeless tobacco: Never Used  Vaping Use  . Vaping Use: Never used  Substance and Sexual Activity  . Alcohol use: Yes    Alcohol/week: 1.0 standard drink    Types: 1 Glasses of wine per week    Comment: each evening  . Drug use: No  . Sexual activity: Not on file  Other Topics Concern  . Not on file  Social History Narrative  . Not on file   Social Determinants of Health   Financial Resource Strain: Low Risk   . Difficulty of Paying Living Expenses: Not very hard  Food Insecurity: Not on file  Transportation Needs: Not on file  Physical Activity: Not on file  Stress: Not on file  Social Connections: Not on file  Intimate Partner Violence: Not on file      Review of Systems  All other systems reviewed and are negative.      Objective:   Physical Exam Vitals reviewed.  Constitutional:      General: He is not in acute distress.    Appearance: Normal appearance. He is well-developed and underweight. He is not diaphoretic.  Cardiovascular:     Rate and Rhythm: Tachycardia present. Rhythm irregular.     Heart sounds: Normal heart sounds. No murmur heard.   Pulmonary:     Effort: Tachypnea present. No accessory muscle usage, respiratory distress or retractions.     Breath sounds: Decreased air movement present. Examination of the right-upper field reveals decreased breath sounds and rales. Examination of the left-upper field reveals  decreased breath sounds. Examination of the right-middle field reveals decreased breath sounds. Examination of the left-middle field reveals decreased breath sounds. Examination of the right-lower field reveals decreased breath sounds. Examination of the left-lower field reveals decreased breath sounds. Decreased breath sounds and rales present. No wheezing or rhonchi.  Chest:     Chest wall: No mass, lacerations, deformity, swelling, tenderness, crepitus or edema.  Abdominal:     General: Bowel sounds  are normal. There is no distension.     Palpations: Abdomen is soft.     Tenderness: There is no abdominal tenderness. There is no guarding or rebound.     Hernia: No hernia is present.  Musculoskeletal:     Cervical back: Neck supple.     Right lower leg: Edema present.     Left lower leg: Edema present.  Lymphadenopathy:     Cervical: No cervical adenopathy.  Neurological:     Mental Status: He is alert.  Psychiatric:        Behavior: Behavior is cooperative.           Assessment & Plan:  Irregular cardiac rhythm - Plan: EKG 12-Lead, Brain natriuretic peptide, COMPLETE METABOLIC PANEL WITH GFR, CBC with Differential/Platelet  EKG today shows sinus tachycardia but there is no evidence of atrial fibrillation.  There is also no evidence of ischemia or infarction.  Patient has peripheral edema.  Differential diagnosis includes blood clot after hip fracture, congestive heart failure, dependent edema and third spacing.  I do not feel that this is due to a blood clot due to the fact that it is limited to his ankles and it is symmetric bilaterally.  Is also been present for over 2 months.  We discussed a venous ultrasound however I believe the pretest probability is quite low and so we will defer that.  Patient did have an echocardiogram in February that showed an ejection fraction of 45%.  There he for he does have decreased ventricular function.  He also is very sedentary and spends the  majority of the day in the chair.  Therefore I believe he has dependent edema and third spacing secondary to inactivity.  To combat this I will check a BMP.  If the patient's renal function can tolerate it, we can try increasing his Lasix.  I also recommended wearing compression hose and increasing his leg exercises and leg activities to increase venous return to the heart.  Await the results of his lab work.

## 2020-12-16 DIAGNOSIS — Z8546 Personal history of malignant neoplasm of prostate: Secondary | ICD-10-CM | POA: Diagnosis not present

## 2020-12-16 DIAGNOSIS — J9611 Chronic respiratory failure with hypoxia: Secondary | ICD-10-CM | POA: Diagnosis not present

## 2020-12-16 DIAGNOSIS — I700 Atherosclerosis of aorta: Secondary | ICD-10-CM | POA: Diagnosis not present

## 2020-12-16 DIAGNOSIS — E039 Hypothyroidism, unspecified: Secondary | ICD-10-CM | POA: Diagnosis not present

## 2020-12-16 DIAGNOSIS — Z9181 History of falling: Secondary | ICD-10-CM | POA: Diagnosis not present

## 2020-12-16 DIAGNOSIS — M4802 Spinal stenosis, cervical region: Secondary | ICD-10-CM | POA: Diagnosis not present

## 2020-12-16 DIAGNOSIS — E441 Mild protein-calorie malnutrition: Secondary | ICD-10-CM | POA: Diagnosis not present

## 2020-12-16 DIAGNOSIS — M47812 Spondylosis without myelopathy or radiculopathy, cervical region: Secondary | ICD-10-CM | POA: Diagnosis not present

## 2020-12-16 DIAGNOSIS — J189 Pneumonia, unspecified organism: Secondary | ICD-10-CM | POA: Diagnosis not present

## 2020-12-16 DIAGNOSIS — S72001D Fracture of unspecified part of neck of right femur, subsequent encounter for closed fracture with routine healing: Secondary | ICD-10-CM | POA: Diagnosis not present

## 2020-12-16 DIAGNOSIS — Z96641 Presence of right artificial hip joint: Secondary | ICD-10-CM | POA: Diagnosis not present

## 2020-12-16 DIAGNOSIS — J439 Emphysema, unspecified: Secondary | ICD-10-CM | POA: Diagnosis not present

## 2020-12-16 DIAGNOSIS — W19XXXD Unspecified fall, subsequent encounter: Secondary | ICD-10-CM | POA: Diagnosis not present

## 2020-12-16 LAB — CBC WITH DIFFERENTIAL/PLATELET
Absolute Monocytes: 549 cells/uL (ref 200–950)
Basophils Absolute: 8 cells/uL (ref 0–200)
Basophils Relative: 0.1 %
Eosinophils Absolute: 0 cells/uL — ABNORMAL LOW (ref 15–500)
Eosinophils Relative: 0 %
HCT: 38 % — ABNORMAL LOW (ref 38.5–50.0)
Hemoglobin: 12.4 g/dL — ABNORMAL LOW (ref 13.2–17.1)
Lymphs Abs: 566 cells/uL — ABNORMAL LOW (ref 850–3900)
MCH: 29.5 pg (ref 27.0–33.0)
MCHC: 32.6 g/dL (ref 32.0–36.0)
MCV: 90.3 fL (ref 80.0–100.0)
MPV: 10 fL (ref 7.5–12.5)
Monocytes Relative: 6.7 %
Neutro Abs: 7077 cells/uL (ref 1500–7800)
Neutrophils Relative %: 86.3 %
Platelets: 343 10*3/uL (ref 140–400)
RBC: 4.21 10*6/uL (ref 4.20–5.80)
RDW: 12.9 % (ref 11.0–15.0)
Total Lymphocyte: 6.9 %
WBC: 8.2 10*3/uL (ref 3.8–10.8)

## 2020-12-16 LAB — COMPLETE METABOLIC PANEL WITH GFR
AG Ratio: 2 (calc) (ref 1.0–2.5)
ALT: 16 U/L (ref 9–46)
AST: 18 U/L (ref 10–35)
Albumin: 4.1 g/dL (ref 3.6–5.1)
Alkaline phosphatase (APISO): 62 U/L (ref 35–144)
BUN: 25 mg/dL (ref 7–25)
CO2: 33 mmol/L — ABNORMAL HIGH (ref 20–32)
Calcium: 9.7 mg/dL (ref 8.6–10.3)
Chloride: 97 mmol/L — ABNORMAL LOW (ref 98–110)
Creat: 0.97 mg/dL (ref 0.70–1.11)
GFR, Est African American: 80 mL/min/{1.73_m2} (ref >=60)
GFR, Est Non African American: 69 mL/min/{1.73_m2} (ref >=60)
Globulin: 2.1 g/dL (calc) (ref 1.9–3.7)
Glucose, Bld: 129 mg/dL — ABNORMAL HIGH (ref 65–99)
Potassium: 5.1 mmol/L (ref 3.5–5.3)
Sodium: 137 mmol/L (ref 135–146)
Total Bilirubin: 0.3 mg/dL (ref 0.2–1.2)
Total Protein: 6.2 g/dL (ref 6.1–8.1)

## 2020-12-16 LAB — BRAIN NATRIURETIC PEPTIDE: Brain Natriuretic Peptide: 66 pg/mL (ref <100)

## 2020-12-19 ENCOUNTER — Telehealth: Payer: Self-pay | Admitting: *Deleted

## 2020-12-19 ENCOUNTER — Ambulatory Visit: Payer: PPO | Admitting: Internal Medicine

## 2020-12-19 ENCOUNTER — Encounter: Payer: Self-pay | Admitting: Internal Medicine

## 2020-12-19 ENCOUNTER — Other Ambulatory Visit: Payer: Self-pay

## 2020-12-19 VITALS — BP 130/74 | HR 108 | Temp 97.7°F | Ht 65.0 in | Wt 125.4 lb

## 2020-12-19 DIAGNOSIS — J9611 Chronic respiratory failure with hypoxia: Secondary | ICD-10-CM | POA: Diagnosis not present

## 2020-12-19 DIAGNOSIS — J449 Chronic obstructive pulmonary disease, unspecified: Secondary | ICD-10-CM | POA: Diagnosis not present

## 2020-12-19 MED ORDER — TRELEGY ELLIPTA 100-62.5-25 MCG/INH IN AEPB: 1.0000 | INHALATION_SPRAY | Freq: Every day | RESPIRATORY_TRACT | 0 refills | Status: DC

## 2020-12-19 NOTE — Telephone Encounter (Signed)
Received call from [place], [facility] PT with [facility].   Requested VO for [facility] PT 2x weekly x4 weeks for gait, balance, endurance, and strengthening.   VO given.

## 2020-12-19 NOTE — Telephone Encounter (Addendum)
Call placed to patient with lab results. Spoke with patient wife, Jaci Standard. States that she has some concerns as follows:  1. Inquired if fluid pill with be increased since kidney function is ok. Please advise.   2. States that patient has been having increased nightmares at night. States that he will also sit on the side of the bed and dose, and then fling himself back into bed. Reports that patient flung himself back onto her on a few occasions.   States that she only started noticing change in behavior after adding Remeron 7.5mg . States that she is now only giving patient a half tablet (3.75mg ) and behaviors have improved.   3. Reports that she will need to cancel Neuro follow up again as it interferes with F/U from ortho for his hip. Inquired as to if they should be worried about re-scheduling neurology or if it can wait.

## 2020-12-19 NOTE — Patient Instructions (Signed)
If doing great, one option would be  To alternate 10 with 5 mg daily    Make sure you check your oxygen saturation  at your highest level of activity  to be sure it stays over 90% and adjust  02 flow upward to maintain this level if needed but remember to turn it back to previous settings when you stop (to conserve your supply).   No other recommendations   Please schedule a follow up visit in 3 months but call sooner if needed

## 2020-12-19 NOTE — Progress Notes (Signed)
John Black, male    DOB: [birth year]     [name]: [id number]   Brief patient profile:  87 yowm quit smoking in 2010 then major aspiration event? MRSA then dx with GOLD II copd as of 2015 prev followed by Dr Gwenette Greet s/p admit:  Admit date: 02/16/2020 Discharge date: 02/19/2020  Discharge Diagnoses:  Principal Problem:   COPD with acute exacerbation ([place])   Hypothyroidism   BPH (benign prostatic hyperplasia)   Acute on chronic respiratory failure with hypoxia (HCC)   Sepsis ([place])   SOB (shortness of breath)  History of present illness:  87 y.o.malewith medical history significant ofCOPD/emphysema, chronic respiratory failure on home oxygen, hypothyroidism presenting with complaints of shortness of breath.Patient reports 3 to 4-day history of progressively worsening dyspnea. He is also wheezing but not coughing much. Denies chest pain. Denies fevers or chills. States he uses 3 to 4 L supplemental oxygen at home all the time.  He was admitted with fever and shortness of breath.  He was found to have a COPD exacerbation.  He's improved with steroids, abx, and scheduled and prn nebs.  6/4 appeared improved and was discharged with plan for outpatient follow up.  Hospital Course:  Acute on chronic hypoxic respiratory failure, suspect secondary to acute COPD exacerbation: CT PE protocol without PE, no acute intrathoracic pathology, advanced emphysema Discharge with steroid taper, complete course of azithromycin, resume home nebs Ceftriaxone x3 days.  Complete 5 day total course of azithromycin.  Discharged with 2 days azithromycin.  Home theophylline Stable on home oxygen, maintained O2 sats with 4 L  Will need home O2 screen prior to d/c- maintained O2 sats 90% on home 4 L today Negative COVID testing, follow RVP negative  Sinus Tachycardia: unclear etiology? Possibly 2/2 nebs. HR up to 120-130 with ambulation with therapy. - on review of previous outpatient notes, he was  tachycardic to 120 on 01/22/20 visit and to 108 at 09/23/19 visit.  Possible chronic component. Daughter notes his HR usually in 80's-90's on pulse ox at home.  EKG and tele show sinus tach. Normal TSH from admission. Echo with grade II diastolic dysfunction, normal EF (see report) -> follow outpatient.  Mild to moderate Dilatation of the ascending aorta: 39 cm on echo - follow outpatient for surveillance   Febrile Illness: With COPD exacerbation, respiratory distress, suspect he had  viral illness leading to exacerbation.     GERD -Continue PPI  BPH -Continue Flomax  Anemia: suspect component of dilution, repeat H/H  Procedures: Echo IMPRESSIONS    1. Left ventricular ejection fraction, by estimation, is 60 to 65%. The  left ventricle has normal function. The left ventricle has no regional  wall motion abnormalities. Left ventricular diastolic parameters are  consistent with Grade II diastolic  dysfunction (pseudonormalization).  2. Right ventricular systolic function is normal. The right ventricular  size is normal.  3. The mitral valve is normal in structure. Mild mitral valve  regurgitation. No evidence of mitral stenosis.  4. The aortic valve is normal in structure. Aortic valve regurgitation is  not visualized. No aortic stenosis is present.  5. Aortic dilatation noted. There is mild to moderate dilatation of the  ascending aorta measuring 39 mm.        History of Present Illness  04/14/2020  Pulmonary/ 1st office eval/ [facility] / [name] / trelegy / prednisone 10  X years  Chief Complaint  Patient presents with  . Consult    shortness of breath  with exertion  Dyspnea:  MMRC3 = can't walk 100 yards even at a slow pace at a flat grade s stopping due to sob - uses w/c a lot Cough: hoarse, some choking with swallowing  Sleep: bed is flat couple of pillows SABA use: neb in am 02 4 lpm hs and 3-4 lpm with sitting/ walking  rec Stop uniphyl  Continue  protonix Take 30-60 min before first meal of the day  GERD  Diet  If breathing gets worse double the prednisone until better then back to 10 mg daily  Make sure you check your oxygen saturations at highest level of activity to be sure it stays over 90% and adjust upward to maintain this level if needed but remember to turn it back to previous settings when you stop (to conserve your supply).  Please schedule a follow up office visit in 6 weeks, call sooner if needed - bring inhalers         05/26/2020  f/u ov/[place] office/Bonna Steury re:  GOLD 3/ 02 dep on trelegy / preferred symbicort/ maint pred at 10 mg daily  Chief Complaint  Patient presents with  . Follow-up    shortness of breath with activity  Dyspnea: weak and sob limited to  room to room at home with cane   Cough: none  Sleeping: bed is flat/ 2 pillows SABA use: nebulizer is being using as backup frequently  02: up to 5lpm  rec Plan A = Automatic = Always=    Breztri Take 2 puffs first thing in am and then another 2 puffs about 12 hours later.  Work on inhaler technique:   Plan B = Backup (to supplement plan A, not to replace it) Only use your albuterol inhaler as a rescue medication   Plan C = Crisis (instead of Plan B but only if Plan B stops working) - only use your albuterol nebulizer if you first try Plan B and it fails to help > ok to use the nebulizer up to every 4 hours but if start needing it regularly call for immediate appointment Plan D = deltasone if losing ground and needing B and  C - double prednisone until better then 1 daily      06/21/2020  f/u ov/[place] office/Benetta Maclaren re:  GOLD III / 02 dep on breztri/ pred 10 mg Chief Complaint  Patient presents with  . Acute Visit  Dyspnea:  Room to room  Cough: more copious/ white / not using max mucinex/ no using flutter as rec  Sleeping: flat bed two pillows SABA use: not using hfa/ neb not understanding when to use  Vs prednisone 20 as plan D above  02: 3-5 lpm  titrates  rec Plan A = Automatic = Always=    Trelegy one click each am    Plan B = Backup (to supplement plan A, not to replace it) Only use your albuterol inhaler as a rescue medication Plan C = Crisis (instead of Plan B but only if Plan B stops working) - only use your albuterol nebulizer if you first try Plan B  Plan D = deltasone if losing ground and needing B and  C - double prednisone until better then 1 daily  For cough/ congestion > mucinex up to 1200 mg  twice and use the flutter as much as possible        08/29/2020  f/u ov/[place] office/Ethanjames Fontenot re:  COPD with GOLD III   spirometry / 02 dep @  24/7 and  prednisone 10 mg daily  X years  Chief Complaint  Patient presents with  . Follow-up    Chest congestion in past week  Dyspnea:   Room to room at home on 4lpm  Cough: none Sleeping: flat bed with 2 pillows  SABA use: very little  02: 4lpm 24/7  rec Plan A = Automatic = Always=    Trelegy one click each am   Plan B = Backup (to supplement plan A, not to replace it) Only use your albuterol inhaler as a rescue medication Plan C = Crisis (instead of Plan B but only if Plan B stops working) - only use your albuterol nebulizer if you first try Plan B and it fails to help > ok to use the nebulizer up to every 4 hours but if start needing it regularly call for immediate appointment Plan D = deltasone if losing ground and needing B and  C - double prednisone until better then 1 daily  For cough/ congestion > mucinex up to 1200 mg twice and use the flutter as much as possible     11/22/20 PC Spoke with the pt's daughter  She states that the pt broke his hip and had replacement Jan 2022 Martin [facility]estic to [place] d/c 11/21/20  b/c he was not progressing and is now wheelchair bound She states before he was d/c  the nurse noticed abnormal breathing and cxr ordered  His cxr showed double pna and he was started on Avelox  11/18/18  She states he is not improving, having a lot of SOB sometimes  even at rest he "pants"  His sats are around 92% on 4lpm cont o2 and drop as low as 70% 4lpm with min exertion  She states he is also wheezing but no cough at this time  No f/c/s Fully vaccinated against covid   11/22/2020  Acute  ov/[place] office/Fonnie Crookshanks re: copd gold III but 02 and  pred dep at 10 mg daily  - increased to 2 daily as of am 3/8  Chief Complaint  Patient presents with  . Acute Visit    Dx with PNA 11/17/20- SOB and wheezing. Sats have been dropping into 70's on 4lpm o2 with exertion.   Dyspnea:  2 person assist to get oob and into w/c then to recliner  Cough: none  Sleeping: flat / two pillows  SABA use: none on day of ov or prior noct despite reports of "struggling to breath 02: concentrator  4lpm and [facility]ogen POC up to 5lpm pulsed / poor insight into titration principles / not using mucinex or flutter valve  rec Plan A =  Automatic = Always=   Trelegy one click each am   Plan B = Backup (to supplement plan A, not to replace it) Only use your albuterol inhaler as a rescue medication Plan C = Crisis (instead of Plan B but only if Plan B stops working) - only use your albuterol nebulizer if you first try Plan B and it fails to help > ok to use the nebulizer up to every 4 hours but if start needing it regularly call for immediate appointment Plan D = deltasone if losing ground and needing B and  C - double prednisone until better then 1 daily  For cough/ congestion > mucinex up to 1200 mg every 12 hours and use the flutter as much as possible  Adjust 02 to keep saturations above 90%  - if can't get comfortable at rest or can't keep 02  above 90% on max flow at rest then will need to go back to ER    12/19/2020  f/u ov/[place] office/Arbor Cohen re: GOLD III/ pred/ 02 dep  maint on trelegy  Chief Complaint  Patient presents with  . Follow-up    Breathing is "probably a little better"   Dyspnea:  Limited by feet/legs weak / mostly staying in w/c but doing his own transfers now   Cough: no  Sleeping: flat bed/ 2 pillows  SABA use: rarely needed  02: 4lpm 24/7  Covid status: x3      No obvious day to day or daytime variability or assoc excess/ purulent sputum or mucus plugs or hemoptysis or cp or chest tightness, subjective wheeze or overt sinus or hb symptoms.   Sleeping as above  without nocturnal  or early am exacerbation  of respiratory  c/o's or need for noct saba. Also denies any obvious fluctuation of symptoms with weather or environmental changes or other aggravating or alleviating factors except as outlined above   No unusual exposure hx or h/o childhood pna/ asthma or knowledge of premature birth.  Current Allergies, Complete Past Medical History, Past Surgical History, Family History, and Social History were reviewed in [employer] [employer] record.  ROS  The following are not active complaints unless bolded Hoarseness, sore throat, dysphagia, dental problems, itching, sneezing,  nasal congestion or discharge of excess mucus or purulent secretions, ear ache,   fever, chills, sweats, unintended wt loss or wt gain, classically pleuritic or exertional cp,  orthopnea pnd or arm/hand swelling  or leg swelling, presyncope, palpitations, abdominal pain, anorexia, nausea, vomiting, diarrhea  or change in bowel habits or change in bladder habits, change in stools or change in urine, dysuria, hematuria,  rash, arthralgias, visual complaints, headache, numbness, weakness   problems with walking or coordination,  change in mood or  memory.        Current Meds  Medication Sig  . acetaminophen (TYLENOL) 650 MG CR tablet Take 650 mg by mouth 3 (three) times daily.  Marland Kitchen albuterol (ACCUNEB) 0.63 MG/3ML nebulizer solution Take 1 ampule by nebulization every 4 (four) hours as needed for wheezing.  . Albuterol Sulfate (PROAIR RESPICLICK) 716 (90 Base) MCG/ACT AEPB Inhale 2 puffs into the lungs every 4 (four) hours as needed.  Marland Kitchen aspirin EC 81 MG tablet Take 81 mg  by mouth. Twice a day from 10/12/2020-11/22/2020 Then take once a day starting 11/23/2020  . cholecalciferol (VITAMIN D3) 25 MCG (1000 UNIT) tablet Take 1,000 Units by mouth daily.  . Ensure (ENSURE) Take 237 mLs by mouth daily.  . Fluticasone-Umeclidin-Vilant (TRELEGY ELLIPTA) 100-62.5-25 MCG/INH AEPB Inhale 1 puff into the lungs daily.  . furosemide (LASIX) 20 MG tablet Take 20 mg by mouth daily.  Marland Kitchen guaiFENesin (MUCINEX) 600 MG 12 hr tablet Take 600 mg by mouth 2 (two) times daily as needed for cough.  . levothyroxine (SYNTHROID) 75 MCG tablet TAKE 1 TABLET (75 MCG TOTAL) BY MOUTH DAILY.  . mirtazapine (REMERON) 15 MG tablet TAKE 1/2 TABLET BY MOUTH IN THE EVENING  . NON FORMULARY Diet: _____ Regular, ___x___ NAS, _______Consistent Carbohydrate, _______NPO _____Other  . OXYGEN Inhale 4 L into the lungs continuous.  . pantoprazole (PROTONIX) 40 MG tablet Take 1 tablet (40 mg total) by mouth 2 (two) times daily. (Patient taking differently: Take 40 mg by mouth daily.)  . polyvinyl alcohol (LIQUIFILM TEARS) 1.4 % ophthalmic solution Place 1 drop into both eyes 3 (three) times daily as needed  for dry eyes.  . predniSONE (DELTASONE) 10 MG tablet Take 1 tablet (10 mg total) by mouth daily with breakfast. HOLD while on Prednisone taper  . tamsulosin (FLOMAX) 0.4 MG CAPS capsule TAKE 1 CAPSULE BY MOUTH EVERY DAY                      Past Medical History:  Diagnosis Date  . Allergic rhinitis   . Cataract    s/p removal  . Chronic respiratory failure (HCC)    oxygen 3L at home  . Emphysema   . Hypothyroid   . PNA (pneumonia)   . Prostate cancer ([place])        Objective:     12/19/2020         125 11/22/2020         122 08/29/2020     124   06/21/2020       115  05/26/20 112 lb 3.2 oz (50.9 kg)  04/14/20 112 lb 12.8 oz (51.2 kg)     Vital signs reviewed  12/19/2020  - Note at rest 02 sats  95% on 4 lpm    General appearance:   eldelry frail wm/ wc bound   HEENT : pt wearing mask not  removed for exam due to covid -19 concerns.    NECK :  without JVD/Nodes/TM/ nl carotid upstrokes bilaterally   LUNGS: no acc muscle use,  Mod barrel  contour chest wall with bilateral  Distant bs s audible wheeze and  without cough on insp or exp maneuvers and mod  Hyperresonant  to  percussion bilaterally     CV:  RRR  no s3 or murmur or increase in P2, and  1+ pitting both LE's sym   ABD:  soft and nontender with pos mid insp Hoover's  in the supine position. No bruits or organomegaly appreciated, bowel sounds nl  MS:     ext warm without deformities, calf tenderness, cyanosis or clubbing No obvious joint restrictions   SKIN: warm and dry without lesions    NEURO:  alert, approp, nl sensorium with  no motor or cerebellar deficits apparent.           Assessment

## 2020-12-20 ENCOUNTER — Encounter: Payer: Self-pay | Admitting: Internal Medicine

## 2020-12-20 MED ORDER — FUROSEMIDE 40 MG PO TABS: 40.0000 mg | ORAL_TABLET | Freq: Every day | ORAL | 1 refills | Status: DC

## 2020-12-20 NOTE — Assessment & Plan Note (Signed)
04/14/2020   Walked RA  approx   300 ft  @ slow pace  stopped due to  End of study with sats  dropping to 88% at end  - 11/07/20  HC03  = 31  - 11/22/2020 rec change 02 to 4-5 lpm no pulsing   Adequate control on present rx, reviewed in detail with pt > no change in rx needed    Advised: Make sure you check your oxygen saturation  at your highest level of activity  to be sure it stays over 90% and adjust  02 flow upward to maintain this level if needed but remember to turn it back to previous settings when you stop (to conserve your supply).    Each maintenance medication was reviewed in detail including emphasizing most importantly the difference between maintenance and prns and under what circumstances the prns are to be triggered using an action plan format where appropriate.  Total time for H and P, chart review, counseling, reviewing elipta/02 device(s) , directly observing portions of ambulatory 02 saturation study/ and generating customized AVS unique to this office visit / same day charting = 25 min

## 2020-12-20 NOTE — Telephone Encounter (Signed)
Call placed to patient and patient made aware.  

## 2020-12-20 NOTE — Assessment & Plan Note (Signed)
Quit smoking  2010 - daily pred since ? 2010?  - PFT's 12/17/13  FEV1 1.17 (57 % ) ratio 0.36  p 6 % improvement from saba p ? prior to study with DLCO  11.56 (47%) corrects to 2.19 (52%)  for alv volume and FV curve classic exp concavity on exp loop and air trapping - d/c theph 04/14/2020 and max rx for gerd  - 05/26/2020   try change trelegy to breztri as prev preferred symbicort - 06/21/2020 changed back to trelegy at pt request -  11/22/2020   Using proair respiclick as plan B and neb saba as plan C with prednisone 10 mg floor and 20 mg / day ceiling    Group D in terms of symptom/risk and laba/lama/ICS  therefore appropriate rx at this point >>>  Continue trelegy and the lowest dose of pred maint feasible = ok to ty 10/5 on alternating days and saba prn

## 2020-12-20 NOTE — Telephone Encounter (Signed)
Ok with lasix 40 mg poqday.  Ok with holding remeron to see if behavior improves.

## 2020-12-21 ENCOUNTER — Telehealth: Payer: Self-pay | Admitting: *Deleted

## 2020-12-21 NOTE — Telephone Encounter (Signed)
Received call from patient wife, Jaci Standard.   Reports that she has noted episode x2 that SpO2 and HR drop. States that HR was in the 30's, and SpO2 was in the low 80's. States that she increased O2 to 5L/min via [place] and HR and SpO2 returned to baseline at HR 60's and SpO2 93- 95%.   States that patient had no concerns during episode and was not aware that vital signs had dropped. States that patient was sitting watching TV when the last episode was noted.   Patient is not in any distress at this time. Advised to intermittently monitor vital signs. If any distress is noted, go to ER for evaluation.   Please advise.

## 2020-12-22 ENCOUNTER — Ambulatory Visit: Payer: PPO | Admitting: Neurology

## 2020-12-22 DIAGNOSIS — J9611 Chronic respiratory failure with hypoxia: Secondary | ICD-10-CM | POA: Diagnosis not present

## 2020-12-22 DIAGNOSIS — R2681 Unsteadiness on feet: Secondary | ICD-10-CM | POA: Diagnosis not present

## 2020-12-22 DIAGNOSIS — J439 Emphysema, unspecified: Secondary | ICD-10-CM | POA: Diagnosis not present

## 2020-12-22 NOTE — Telephone Encounter (Signed)
Call placed to patient and patient wife made aware.  Reports that she will discuss with daughter and return call if they wish to proceed.

## 2020-12-22 NOTE — Telephone Encounter (Signed)
Would recommend cardiology consult for monitor to see if he is having frequent bradycardia that would require a pacemaker.

## 2020-12-27 ENCOUNTER — Telehealth: Payer: Self-pay | Admitting: Family Medicine

## 2020-12-27 NOTE — Telephone Encounter (Signed)
Copied from [place] [id number]. Topic: Medicare AWV >> Dec 27, 2020  1:08 PM Cher Nakai R wrote: Reason for CRM:  Left message for patient to call back and schedule Medicare Annual Wellness Visit (AWV) in office.   If not able to come in office, please offer to do virtually or by telephone.   Last AWV: 11/11/2019  Please schedule at anytime with [facility]-Nurse Health Advisor.  If any questions, please contact me at [phone]

## 2020-12-29 ENCOUNTER — Telehealth: Payer: Self-pay | Admitting: Internal Medicine

## 2020-12-29 DIAGNOSIS — J9611 Chronic respiratory failure with hypoxia: Secondary | ICD-10-CM

## 2020-12-29 NOTE — Telephone Encounter (Signed)
I have called and LM on VM for John Black to return our call.   The following order has been sent in to [facility].  Type Date User Summary Attachment  Provider Comments 12/19/2020 12:13 PM Rosana Berger, CMA Provider Comments -  Note   Per Dr Melvyn Novas please send order to [facility] to change o2 to 4lpm continuous flow   Length of need:lifetime

## 2021-01-02 NOTE — Telephone Encounter (Signed)
John Black daughter is returning phone call. John Black phone number is [phone].

## 2021-01-02 NOTE — Progress Notes (Signed)
Chronic Care Management Pharmacy Note  01/05/2021 Name:  John Black [name]:  [id number] DOB:  1929/02/22  Subjective: John Black is an 87 y.o. year old male who is a primary patient of Pickard, Cammie Mcgee, MD.  The CCM team was consulted for assistance with disease management and care coordination needs.    Engaged with patient by telephone for follow up visit in response to provider referral for pharmacy case management and/or care coordination services.   Consent to Services:  The patient was given the following information about Chronic Care Management services today, agreed to services, and gave verbal consent: 1. CCM service includes personalized support from designated clinical staff supervised by the primary care provider, including individualized plan of care and coordination with other care providers 2. 24/7 contact phone numbers for assistance for urgent and routine care needs. 3. Service will only be billed when office clinical staff spend 20 minutes or more in a month to coordinate care. 4. Only one practitioner may furnish and bill the service in a calendar month. 5.The patient may stop CCM services at any time (effective at the end of the month) by phone call to the office staff. 6. The patient will be responsible for cost sharing (co-pay) of up to 20% of the service fee (after annual deductible is met). Patient agreed to services and consent obtained.  Patient Care Team: Susy Frizzle, MD as PCP - General (Family Medicine) Satira Sark, MD as PCP - Cardiology (Cardiology) Edythe Clarity, [facility] as Pharmacist (Pharmacist)  Recent office visits: 12/15/20 Dennard Schaumann) - due to some swelling in ankles, increased Lasix.  Also recommended compression hose and increasing leg movements to reduce swelling.  Recent consult visits: 11/22/20 Pulmonology Tanda Rockers, MD. Burgess Estelle preformed. CHANGED mucinex up to 1200 mg every 12 hours. 11/18/20 Gerlene Fee, NP. For  Pneumonia. CHANGED mucinex to 600 mg twice daily   11/17/20  Gerlene Fee, NP. For COPD. Xray performed. No medication changes. 11/07/20 Gerlene Fee, NP. For healing on right hip.CHANGED Acetaminophen to 650 mg three times daily  Hospital visits: 10/20/2020 Kathie Dike, MD. Syncope and collapse. Per note patient is currently in the hospital.  10/09/20 Tat, Shanon Brow, MD For Acute rightfemoral neckfracture, Closed fracture of right hip. Surgery done on 10/10/20 ARTHROPLASTY BIPOLAR HIP (HEMIARTHROPLASTY). DISCHARGED on 10/12/20. Continue medications, no medication changes.  09/08/20 Mendel Corning, MD. For Stroke. STARTED Zetia 10 mg daily, Prednisone taper pack, and Aspirin 81 daily  Objective:  Lab Results  Component Value Date   CREATININE 0.97 12/15/2020   BUN 25 12/15/2020   GFR 79.57 08/07/2019   GFRNONAA 69 12/15/2020   GFRAA 80 12/15/2020   NA 137 12/15/2020   K 5.1 12/15/2020   CALCIUM 9.7 12/15/2020   CO2 33 (H) 12/15/2020   GLUCOSE 129 (H) 12/15/2020    Lab Results  Component Value Date/Time   HGBA1C 5.7 (H) 09/09/2020 08:00 AM   HGBA1C 5.9 (H) 09/09/2020 04:03 AM   GFR 79.57 08/07/2019 02:42 PM    Last diabetic Eye exam: No results found for: HMDIABEYEEXA  Last diabetic Foot exam: No results found for: HMDIABFOOTEX   Lab Results  Component Value Date   CHOL 270 (H) 09/09/2020   HDL 121 09/09/2020   LDLCALC 140 (H) 09/09/2020   TRIG 47 09/09/2020   CHOLHDL 2.2 09/09/2020    Hepatic Function Latest Ref Rng & Units 12/15/2020 10/21/2020 10/20/2020  Total Protein 6.1 - 8.1 g/dL 6.2 6.5  6.3(L)  Albumin 3.5 - 5.0 g/dL - 3.4(L) 3.2(L)  AST 10 - 35 U/L 18 32 36  ALT 9 - 46 U/L 16 52(H) 51(H)  Alk Phosphatase 38 - 126 U/L - 74 69  Total Bilirubin 0.2 - 1.2 mg/dL 0.3 0.5 0.5    Lab Results  Component Value Date/Time   TSH 3.236 10/21/2020 05:04 AM   TSH 1.104 02/16/2020 06:21 PM   TSH 1.59 11/11/2019 09:24 AM   TSH 1.44 01/27/2019 11:13 AM   FREET4 1.6  11/11/2019 09:24 AM   FREET4 1.5 01/27/2019 11:13 AM    CBC Latest Ref Rng & Units 12/15/2020 10/28/2020 10/27/2020  WBC 3.8 - 10.8 Thousand/uL 8.2 8.1 12.4(H)  Hemoglobin 13.2 - 17.1 g/dL 12.4(L) 11.2(L) 11.6(L)  Hematocrit 38.5 - 50.0 % 38.0(L) 36.2(L) 37.4(L)  Platelets 140 - 400 Thousand/uL 343 429(H) 477(H)    Lab Results  Component Value Date/Time   VD25OH 28.37 (L) 10/09/2020 08:00 AM    Clinical ASCVD: No  The ASCVD Risk score Mikey Bussing DC Jr., et al., 2013) failed to calculate for the following reasons:   The 2013 ASCVD risk score is only valid for ages 44 to 30    Depression screen PHQ 2/9 07/12/2020 03/11/2020 11/11/2019  Decreased Interest 0 0 0  Down, Depressed, Hopeless 0 0 0  PHQ - 2 Score 0 0 0  Altered sleeping - - 0  Tired, decreased energy - - 0  Change in appetite - - 0  Feeling bad or failure about yourself  - - 0  Trouble concentrating - - 0  Moving slowly or fidgety/restless - - 0  Suicidal thoughts - - 0  PHQ-9 Score - - 0  Difficult doing work/chores - - Not difficult at all  Some recent data might be hidden      Social History   Tobacco Use  Smoking Status Former Research scientist (life sciences)  . Packs/day: 1.50  . Years: 50.00  . Pack years: 75.00  . Types: Cigarettes  . Quit date: 09/17/2008  . Years since quitting: 12.3  Smokeless Tobacco Never Used   BP Readings from Last 3 Encounters:  12/19/20 130/74  12/15/20 132/68  11/22/20 130/80   Pulse Readings from Last 3 Encounters:  12/19/20 (!) 108  12/15/20 (!) 112  11/22/20 (!) 113   Wt Readings from Last 3 Encounters:  12/19/20 125 lb 6.4 oz (56.9 kg)  11/22/20 122 lb (55.3 kg)  11/18/20 116 lb 6.4 oz (52.8 kg)   BMI Readings from Last 3 Encounters:  12/19/20 20.87 kg/m  11/22/20 20.30 kg/m  11/18/20 19.37 kg/m    Assessment/Interventions: Review of patient past medical history, allergies, medications, health status, including review of consultants reports, laboratory and other test data, was  performed as part of comprehensive evaluation and provision of chronic care management services.   SDOH:  (Social Determinants of Health) assessments and interventions performed: Yes   Financial Resource Strain: Low Risk   . Difficulty of Paying Living Expenses: Not very hard    SDOH Screenings   Alcohol Screen: Low Risk   . Last Alcohol Screening Score (AUDIT): 1  Depression (PHQ2-9): Low Risk   . PHQ-2 Score: 0  Financial Resource Strain: Low Risk   . Difficulty of Paying Living Expenses: Not very hard  Food Insecurity: Not on file  Housing: Not on file  Physical Activity: Not on file  Social Connections: Not on file  Stress: Not on file  Tobacco Use: Medium Risk  . Smoking Tobacco  Use: Former Smoker  . Smokeless Tobacco Use: Never Used  Transportation Needs: Not on file    CCM Care Plan  Allergies  Allergen Reactions  . Morphine Other (See Comments)    REACTION: sweats    Medications Reviewed Today    Reviewed by Edythe Clarity, [facility] (Pharmacist) on 01/05/21 at [place] List Status: <None>  Medication Order Taking? Sig Documenting Provider Last Dose Status Informant  acetaminophen (TYLENOL) 650 MG CR tablet [id number] Yes Take 650 mg by mouth 3 (three) times daily. [provider] Taking Active   albuterol (ACCUNEB) 0.63 MG/3ML nebulizer solution [id number] Yes Take 1 ampule by nebulization every 4 (four) hours as needed for wheezing. [provider] Taking Active   Albuterol Sulfate (PROAIR RESPICLICK) 993 (90 Base) MCG/ACT AEPB [id number] Yes Inhale 2 puffs into the lungs every 4 (four) hours as needed. [provider] Taking Active   aspirin EC 81 MG tablet [id number] Yes Take 81 mg by mouth. Twice a day from 10/12/2020-11/22/2020 Then take once a day starting 11/23/2020 [provider] Taking Active Nursing Home Medication Administration Guide (MAG)  cholecalciferol (VITAMIN D3) 25 MCG (1000 UNIT) tablet [id number] Yes Take 1,000 Units by  mouth daily. [provider] Taking Active   Ensure [facility]) [id number] Yes Take 237 mLs by mouth daily. [provider] Taking Active   Fluticasone-Umeclidin-Vilant (TRELEGY ELLIPTA) 100-62.5-25 MCG/INH AEPB [id number] Yes Inhale 1 puff into the lungs daily. Tanda Rockers, MD Taking Active Nursing Home Medication Administration Guide (MAG)  Fluticasone-Umeclidin-Vilant (TRELEGY ELLIPTA) 100-62.5-25 MCG/INH AEPB [id number] Yes Inhale 1 puff into the lungs daily. Tanda Rockers, MD Taking Active   furosemide (LASIX) 40 MG tablet [id number] Yes Take 1 tablet (40 mg total) by mouth daily. Susy Frizzle, MD Taking Active   guaiFENesin (MUCINEX) 600 MG 12 hr tablet [id number] Yes Take 600 mg by mouth 2 (two) times daily as needed for cough. [provider] Taking Active Nursing Home Medication Administration Guide (MAG)  levothyroxine (SYNTHROID) 75 MCG tablet [id number] Yes TAKE 1 TABLET (75 MCG TOTAL) BY MOUTH DAILY. Alycia Rossetti, MD Taking Active   mirtazapine (REMERON) 15 MG tablet [id number] Yes TAKE 1/2 TABLET BY MOUTH IN THE [facility], Modena Nunnery, MD Taking Active Nursing Home Medication Administration Guide [facility])  NON Mervyn Gay [id number] Yes Diet: _____ Regular, ___x___ NAS, _______Consistent Carbohydrate, _______NPO _____Other [provider] Taking Active Nursing Home Medication Administration Guide (MAG)  OXYGEN [id number] Yes Inhale 4 L into the lungs continuous. [provider] Taking Active Nursing Home Medication Administration Guide (MAG)           Med Note Oralia Manis   Mon Oct 24, 2020  2:06 PM)    pantoprazole (PROTONIX) 40 MG tablet [id number] Yes Take 1 tablet (40 mg total) by mouth 2 (two) times daily.  Patient taking differently: Take 40 mg by mouth daily.   Kathie Dike, MD Taking Active   polyvinyl alcohol (LIQUIFILM TEARS) 1.4 % ophthalmic solution [id number] Yes Place 1 drop into both eyes 3 (three) times daily as  needed for dry eyes. [provider] Taking Active Nursing Home Medication Administration Guide (MAG)  predniSONE (DELTASONE) 10 MG tablet [id number] Yes Take 1 tablet (10 mg total) by mouth daily with breakfast. HOLD while on Prednisone taper Rai, Vernelle Emerald, MD Taking Active Nursing Home Medication Administration Guide (MAG)  tamsulosin (FLOMAX) 0.4 MG CAPS capsule [id number] Yes TAKE 1 CAPSULE BY MOUTH EVERY DAY [place], Modena Nunnery, MD Taking  Active Nursing Home Medication Administration Guide (MAG)          Patient Active Problem List   Diagnosis Date Noted  . Bilateral rotator cuff syndrome 11/07/2020  . Syncope and collapse 10/20/2020  . Hyperglycemia 10/19/2020  . Aortic atherosclerosis ([place]) 10/15/2020  . GERD without esophagitis 10/15/2020  . Acute blood loss as cause of postoperative anemia 10/15/2020  . Vitamin D deficiency 10/15/2020  . Closed fracture of right hip ([place]) 10/09/2020  . Dysphagia 07/24/2019  . Mild protein-calorie malnutrition ([place]) 02/24/2019  . Gait instability 01/27/2019  . Right inguinal hernia   . Chronic diarrhea 12/06/2017  . Inguinal hernia 03/02/2014  . Constipation 03/02/2014  . Chronic respiratory failure with hypoxia ([place]) 12/09/2013  . Hemorrhoids 09/28/2013  . Hyperlipidemia 06/28/2013  . Sinusitis, chronic 11/28/2012  . COPD with acute exacerbation ([place]) 11/10/2012  . BPH (benign prostatic hyperplasia) 10/22/2012  . Insomnia 10/22/2012  . HCAP (healthcare-associated pneumonia) 09/05/2012  . Hypothyroidism 06/19/2012  . Atypical nevi 06/19/2012  . Seborrheic keratosis 06/19/2012  . History of prostate cancer 11/07/2010  . Allergic rhinitis 11/07/2010  . COPD GOLD 3/ group D  02 dep 11/07/2010    Immunization History  Administered Date(s) Administered  . Fluad Quad(high Dose 65+) 05/13/2019, 07/12/2020  . Influenza Whole 05/30/2011, 06/17/2012  . Influenza, High Dose Seasonal PF 05/28/2017, 06/19/2018  . Influenza,inj,Quad  PF,6+ Mos 05/28/2013, 06/10/2014, 06/17/2015, 06/22/2016  . PFIZER(Purple Top)SARS-COV-2 Vaccination 10/06/2019, 10/26/2019, 07/29/2020  . Pneumococcal Conjugate-13 07/20/2014  . Pneumococcal Polysaccharide-23 06/19/2012  . Tdap 10/20/2020    Conditions to be addressed/monitored:  allergic rhinitis, COPD, chronic diarrhea, hypothyroidism, BPH, mild hyperlipidemia.  Care Plan : General Pharmacy (Adult)  Updates made by Edythe Clarity, [facility] since 01/05/2021 12:00 AM    Problem: allergic rhinitis, COPD, chronic diarrhea, hypothyroidism, BPH, mild hyperlipidemia.   Priority: High  Onset Date: 01/05/2021    Long-Range Goal: Patient-Specific Goal   Start Date: 01/05/2021  Expected End Date: 07/07/2021  This Visit's Progress: On track  Priority: High  Note:   Current Barriers:  . Unable to maintain control of edema . Does not adhere to prescribed medication regimen  Pharmacist Clinical Goal(s):  Marland Kitchen Patient will achieve control of edema as evidenced by self monitoring . adhere to prescribed medication regimen as evidenced by fill date/pill count . contact provider office for questions/concerns as evidenced notation of same in electronic health record through collaboration with PharmD and provider.   Interventions: . 1:1 collaboration with Susy Frizzle, MD regarding development and update of comprehensive plan of care as evidenced by provider attestation and co-signature . Inter-disciplinary care team collaboration (see longitudinal plan of care) . Comprehensive medication review performed; medication list updated in electronic medical record  Hyperlipidemia: (LDL goal < 100) -Not ideally controlled -Current treatment: . None at this time -Medications previously tried: Zetia, statins -Current dietary patterns: patient does not have much of an appetite per his wife -Current exercise habits: none -Educated on Cholesterol goals;  Importance of limiting foods high in cholesterol;   -Based on age and comorbidity, would not recommend starting patient on another medication at this time.  He is unable to tolerate statins and he wished to d/c Zetia.   -Recommended continue current management strategy.  Recommend repeat lipid panel for follow up.  COPD (Goal: control symptoms and prevent exacerbations) -Controlled -Current treatment  . Trelegy Ellipta 100-62.5-25mcg . Albuterol HFA 74mg prn . Prednisone 181mdaily -Medications previously tried: AnRomona Curls-Exacerbations requiring treatment in  last 6 months: none -Patient reports consistent use of maintenance inhaler -Frequency of rescue inhaler use: a few times per day -Counseled on Proper inhaler technique; Benefits of consistent maintenance inhaler use Differences between maintenance and rescue inhalers  -He reports breathing well managed, using rescue appropriately -Recommended to continue current medication  Hypothyroidism (Goal: maintain TSH) -Controlled -Current treatment  . Levothryoxine 69mg daily -Medications previously tried: none noted -Taking appropriately, TSH WNL  -Recommended to continue current medication  Swelling? (Goal: Reduce swelling) -Not ideally controlled -Current treatment  . Furosemide 453mdaily - patient only taking 2038mose -Medications previously tried: none noted -Reports swelling still has not resolved, they did not take the 42m61mse due to taking other antihistamines.  Reports edema in both feet.    -Recommended they take 42mg84me as prescribed for a day or two to see if this resolved the issue.  If this does not resolve the swelling they are to contact office or myself.     Patient Goals/Self-Care Activities . Patient will:  - take medications as prescribed focus on medication adherence by filld ate monitor swelling, contact office if it does not improve.  Follow Up Plan: The care management team will reach out to the patient again over the next 120 days.         Medication Assistance: None required.  Patient affirms current coverage meets needs.  Patient's preferred pharmacy is:  [id number] [address] [place] - [address] [address]: [phone] [address] [address]: [phone] pill box? No - lines up his vials Pt endorses 100% compliance  We discussed: Benefits of medication synchronization, packaging and delivery as well as enhanced pharmacist oversight with [employer]. Patient decided to: Continue current medication management strategy  Care Plan and Follow Up Patient Decision:  Patient agrees to Care Plan and Follow-up.  Plan: The care management team will reach out to the patient again over the next 120 days.  ChristBeverly MilchmD Clinical Pharmacist [place] [phone]

## 2021-01-02 NOTE — Telephone Encounter (Signed)
Spoke with John Black does not have travel tanks that the pt needs  She is asking [place] to now send order to [place] for "travel solutions" tanks  Order sent to [facility]

## 2021-01-05 ENCOUNTER — Telehealth: Payer: Self-pay | Admitting: Internal Medicine

## 2021-01-05 ENCOUNTER — Ambulatory Visit (INDEPENDENT_AMBULATORY_CARE_PROVIDER_SITE_OTHER): Payer: PPO | Admitting: Pharmacist

## 2021-01-05 DIAGNOSIS — J449 Chronic obstructive pulmonary disease, unspecified: Secondary | ICD-10-CM

## 2021-01-05 DIAGNOSIS — E039 Hypothyroidism, unspecified: Secondary | ICD-10-CM | POA: Diagnosis not present

## 2021-01-05 DIAGNOSIS — J9611 Chronic respiratory failure with hypoxia: Secondary | ICD-10-CM

## 2021-01-05 DIAGNOSIS — E782 Mixed hyperlipidemia: Secondary | ICD-10-CM

## 2021-01-05 NOTE — Telephone Encounter (Signed)
Order has been placed to be sent to [employer] [place].  I called and spoke with Leveda Anna and she is aware of order sent.  She stated that they are in the 5 year loop hole with the POC and [employer] since medicare has already paid for this.  She cannot get the oxygen from anywhere else due to the insurance denying it since they have already paid for this via [employer].  Leveda Anna stated that she will continue to speak with [employer] about this issues.

## 2021-01-05 NOTE — Patient Instructions (Addendum)
Visit Information  Goals Addressed            This Visit's Progress   . Manage My Medicine       Timeframe:  Long-Range Goal Priority:  High Start Date:      01/05/21                       Expected End Date:       07/07/21                Follow Up Date 04/16/21   - call for medicine refill 2 or 3 days before it runs out - keep a list of all the medicines I take; vitamins and herbals too - use an alarm clock or phone to remind me to take my medicine    Why is this important?   . These steps will help you keep on track with your medicines.   Notes:       Patient Care Plan: General Pharmacy (Adult)    Problem Identified: allergic rhinitis, COPD, chronic diarrhea, hypothyroidism, BPH, mild hyperlipidemia.   Priority: High  Onset Date: 01/05/2021    Long-Range Goal: Patient-Specific Goal   Start Date: 01/05/2021  Expected End Date: 07/07/2021  This Visit's Progress: On track  Priority: High  Note:   Current Barriers:  . Unable to maintain control of edema . Does not adhere to prescribed medication regimen  Pharmacist Clinical Goal(s):  Marland Kitchen Patient will achieve control of edema as evidenced by self monitoring . adhere to prescribed medication regimen as evidenced by fill date/pill count . contact provider office for questions/concerns as evidenced notation of same in electronic health record through collaboration with PharmD and provider.   Interventions: . 1:1 collaboration with Susy Frizzle, MD regarding development and update of comprehensive plan of care as evidenced by provider attestation and co-signature . Inter-disciplinary care team collaboration (see longitudinal plan of care) . Comprehensive medication review performed; medication list updated in electronic medical record  Hyperlipidemia: (LDL goal < 100) -Not ideally controlled -Current treatment: . None at this time -Medications previously tried: Zetia, statins -Current dietary patterns: patient does  not have much of an appetite per his wife -Current exercise habits: none -Educated on Cholesterol goals;  Importance of limiting foods high in cholesterol;  -Based on age and comorbidity, would not recommend starting patient on another medication at this time.  He is unable to tolerate statins and he wished to d/c Zetia.   -Recommended continue current management strategy.  Recommend repeat lipid panel for follow up.  COPD (Goal: control symptoms and prevent exacerbations) -Controlled -Current treatment  . Trelegy Ellipta 100-62.5-25mcg . Albuterol HFA 36mcg prn . Prednisone 10mg  daily -Medications previously tried: Romona Curls  -Exacerbations requiring treatment in last 6 months: none -Patient reports consistent use of maintenance inhaler -Frequency of rescue inhaler use: a few times per day -Counseled on Proper inhaler technique; Benefits of consistent maintenance inhaler use Differences between maintenance and rescue inhalers  -He reports breathing well managed, using rescue appropriately -Recommended to continue current medication  Hypothyroidism (Goal: maintain TSH) -Controlled -Current treatment  . Levothryoxine 88mcg daily -Medications previously tried: none noted -Taking appropriately, TSH WNL  -Recommended to continue current medication  Swelling? (Goal: Reduce swelling) -Not ideally controlled -Current treatment  . Furosemide 40mg  daily - patient only taking 20mg  dose -Medications previously tried: none noted -Reports swelling still has not resolved, they did not take the 40mg  dose due to taking other  antihistamines.  Reports edema in both feet.    -Recommended they take 40mg  dose as prescribed for a day or two to see if this resolved the issue.  If this does not resolve the swelling they are to contact office or myself.     Patient Goals/Self-Care Activities . Patient will:  - take medications as prescribed focus on medication adherence by filld ate monitor  swelling, contact office if it does not improve.  Follow Up Plan: The care management team will reach out to the patient again over the next 120 days.        The patient verbalized understanding of instructions, educational materials, and care plan provided today and agreed to receive a mailed copy of patient instructions, educational materials, and care plan.  Telephone follow up appointment with pharmacy team member scheduled for:  Edythe Clarity, [facility]  COPD Action Plan A COPD action plan is a description of what to do when you have a flare (exacerbation) of chronic obstructive pulmonary disease (COPD). Your action plan is a color-coded plan that lists the symptoms that indicate whether your condition is under control and what actions to take.  If you have symptoms in the green zone, it means you are doing well that day.  If you have symptoms in the yellow zone, it means you are having a bad day or an exacerbation.  If you have symptoms in the red zone, you need urgent medical care. Follow the plan that you and your health care provider developed. Review your plan with your health care provider at each visit. Red zone Symptoms in this zone mean that you should get medical help right away. They include:  Feeling very short of breath, even when you are resting.  Not being able to do any activities because of poor breathing.  Not being able to sleep because of poor breathing.  Fever or shaking chills.  Feeling confused or very sleepy.  Chest pain.  Coughing up blood. If you have any of these symptoms, call emergency services (911 in the [place] or go to the nearest emergency room.   Yellow zone Symptoms in this zone mean that your condition may be getting worse. They include:  Feeling more short of breath than usual.  Having less energy for daily activities than usual.  Phlegm or mucus that is thicker than usual.  Needing to use your rescue inhaler or nebulizer more  often than usual.  More ankle swelling than usual.  Coughing more than usual.  Feeling like you have a chest cold.  Trouble sleeping due to COPD symptoms.  Decreased appetite.  COPD medicines not helping as much as usual. If you experience any "yellow" symptoms:  Keep taking your daily medicines as directed.  Use your quick-relief inhaler as told by your health care provider.  If you were prescribed steroid medicine to take by mouth (oral medicine), start taking it as told by your health care provider.  If you were prescribed an antibiotic medicine, start taking it as told by your health care provider. Do not stop taking the antibiotic even if you start to feel better.  Use oxygen as told by your health care provider.  Get more rest.  Do your pursed-lip breathing exercises.  Do not smoke. Avoid any irritants in the air. If your signs and symptoms do not improve after taking these steps, call your health care provider right away.   Green zone Symptoms in this zone mean that you are doing well.  They include:  Being able to do your usual activities and exercise.  Having the usual amount of coughing, including the same amount of phlegm or mucus.  Being able to sleep well.  Having a good appetite.   Where to find more information: You can find more information about COPD from:  American Lung Association, My COPD Action Plan: www.lung.org  COPD Foundation: www.copdfoundation.[place]: https://wilson-eaton.com/ Follow these instructions at home:  Continue taking your daily medicines as told by your health care provider.  Make sure you receive all the immunizations that your health care provider recommends, especially the pneumococcal and influenza vaccines.  Wash your hands often with soap and water. Have family members wash their hands too. Regular hand washing can help prevent infections.  Follow your usual exercise and diet  plan.  Avoid irritants in the air, such as smoke.  Do not use any products that contain nicotine or tobacco. These products include cigarettes, chewing tobacco, and vaping devices, such as e-cigarettes. If you need help quitting, ask your health care provider. Summary  A COPD action plan tells you what to do when you have a flare (exacerbation) of chronic obstructive pulmonary disease (COPD).  Follow each action plan for your symptoms. If you have any symptoms in the red zone, call emergency services (911 in the [place] or go to the nearest emergency room. This information is not intended to replace advice given to you by your health care provider. Make sure you discuss any questions you have with your health care provider. Document Revised: 07/12/2020 Document Reviewed: 07/12/2020 Elsevier Patient Education  2021 [employer].

## 2021-01-10 ENCOUNTER — Ambulatory Visit: Payer: PPO | Admitting: Family Medicine

## 2021-01-10 NOTE — Telephone Encounter (Signed)
I received fax today from Raechel Chute RRT at [facility] stating they had tried to contat pt several times to swap out portable O2 for something continuous and haven't received a call back.  She stated pt can contact her at [phone] or customer service at [phone].  I called pt's emergency contact Leveda Anna, that initiated this request, and gave her contact info for [employer] customer service.  She stated she would call Lauren.  Nothing further needed.

## 2021-01-12 ENCOUNTER — Other Ambulatory Visit: Payer: Self-pay

## 2021-01-12 ENCOUNTER — Encounter: Payer: Self-pay | Admitting: Family Medicine

## 2021-01-12 ENCOUNTER — Ambulatory Visit (INDEPENDENT_AMBULATORY_CARE_PROVIDER_SITE_OTHER): Payer: PPO | Admitting: Family Medicine

## 2021-01-12 VITALS — BP 128/78 | HR 112 | Temp 98.1°F | Resp 18

## 2021-01-12 DIAGNOSIS — M7989 Other specified soft tissue disorders: Secondary | ICD-10-CM

## 2021-01-13 NOTE — Progress Notes (Signed)
Subjective:    Patient ID: John Black, male    DOB: [birth year], 87 y.o.   [name]: [id number]  HPI  12/15/20 Patient is a very pleasant 87 year old Caucasian male here today with his wife as well as his daughter.  He was previously seeing my partner who is since retired from family medicine.  He has chronic O2 dependent respiratory failure.  He is on 5 L of oxygen and this is his new baseline.  He also has a history of emphysema for which he is on Trelegy.  Patient fell and broke his right hip.  Ever since surgery he has noticed bipedal edema.  It was even worse when he was in short-term rehab at the [facility].  It has improved since being started on Lasix by his pulmonologist.  However he also reports increasing dyspnea on exertion.  He is sedentary and confined to his wheelchair.  However his oxygen drops precipitously with minimal activity which is a change from his baseline.  Today, the patient has +1 bipedal edema that stops at his ankles.  He has a negative Homans' sign and no edema in his lower legs beyond his ankle.  It is symmetric bilaterally.  He also has crackles anteriorly in the right upper lung.  He has very little air movement posteriorly that I can appreciate.  However he is tachycardic with an irregular rhythm.  At that time, my plan was: EKG today shows sinus tachycardia but there is no evidence of atrial fibrillation.  There is also no evidence of ischemia or infarction.  Patient has peripheral edema.  Differential diagnosis includes blood clot after hip fracture, congestive heart failure, dependent edema and third spacing.  I do not feel that this is due to a blood clot due to the fact that it is limited to his ankles and it is symmetric bilaterally.  Is also been present for over 2 months.  We discussed a venous ultrasound however I believe the pretest probability is quite low and so we will defer that.  Patient did have an echocardiogram in February that showed an ejection  fraction of 45%.  There he for he does have decreased ventricular function.  He also is very sedentary and spends the majority of the day in the chair.  Therefore I believe he has dependent edema and third spacing secondary to inactivity.  To combat this I will check a BMP.  If the patient's renal function can tolerate it, we can try increasing his Lasix.  I also recommended wearing compression hose and increasing his leg exercises and leg activities to increase venous return to the heart.  Await the results of his lab work.  01/13/21 Patient is here today with his family requesting a referral to cardiology.  They request the referral due to swelling in his legs.  They state that his ankles and feet swell throughout the day and this causes him to report tingling and pain in his feet.  He has shortness of breath is his baseline.  He is on 5 L of oxygen due to end-stage COPD.  I reviewed the echocardiogram with the patient from February which showed an ejection fraction of 45 to 50%.  There was also slight right ventricular hypertrophy however otherwise the echocardiogram was normal.  He denies any chest pain or angina.  He does have a sore on the tops of both ears from where his oxygen cannula is rubbing.  It appears to be a friction callus as it is  in the exact same place on both sides.  Each area is roughly 4 mm in diameter.  There is thick white skin and fissuring of the skin in that area.  There is no erythema or evidence of infection Past Medical History:  Diagnosis Date  . Allergic rhinitis   . Cataract    s/p removal  . Chronic respiratory failure (HCC)    oxygen 3L at home  . Emphysema   . Hypothyroid   . PNA (pneumonia)   . Prostate cancer [facility])    Past Surgical History:  Procedure Laterality Date  . ADENOIDECTOMY    . HIP ARTHROPLASTY Right 10/10/2020   Procedure: ARTHROPLASTY BIPOLAR HIP (HEMIARTHROPLASTY);  Surgeon: Mordecai Rasmussen, MD;  Location: [facility];  Service: Orthopedics;   Laterality: Right;  . ROTATOR CUFF REPAIR  1990,2009   bilateral  . Seed implant for prostate cancer  2000  . TONSILLECTOMY    . TRANSURETHRAL RESECTION OF PROSTATE  2001   x2   Current Outpatient Medications on File Prior to Visit  Medication Sig Dispense Refill  . acetaminophen (TYLENOL) 650 MG CR tablet Take 650 mg by mouth 3 (three) times daily.    Marland Kitchen albuterol (ACCUNEB) 0.63 MG/3ML nebulizer solution Take 1 ampule by nebulization every 4 (four) hours as needed for wheezing.    . Albuterol Sulfate (PROAIR RESPICLICK) 161 (90 Base) MCG/ACT AEPB Inhale 2 puffs into the lungs every 4 (four) hours as needed.    Marland Kitchen aspirin EC 81 MG tablet Take 81 mg by mouth. Twice a day from 10/12/2020-11/22/2020 Then take once a day starting 11/23/2020    . cholecalciferol (VITAMIN D3) 25 MCG (1000 UNIT) tablet Take 1,000 Units by mouth daily.    . Ensure (ENSURE) Take 237 mLs by mouth daily.    . Fluticasone-Umeclidin-Vilant (TRELEGY ELLIPTA) 100-62.5-25 MCG/INH AEPB Inhale 1 puff into the lungs daily. 60 each 11  . Fluticasone-Umeclidin-Vilant (TRELEGY ELLIPTA) 100-62.5-25 MCG/INH AEPB Inhale 1 puff into the lungs daily. 7 each 0  . furosemide (LASIX) 40 MG tablet Take 1 tablet (40 mg total) by mouth daily. 30 tablet 1  . guaiFENesin (MUCINEX) 600 MG 12 hr tablet Take 600 mg by mouth 2 (two) times daily as needed for cough.    . levothyroxine (SYNTHROID) 75 MCG tablet TAKE 1 TABLET (75 MCG TOTAL) BY MOUTH DAILY. 90 tablet 3  . mirtazapine (REMERON) 15 MG tablet TAKE 1/2 TABLET BY MOUTH IN THE EVENING 45 tablet 2  . NON FORMULARY Diet: _____ Regular, ___x___ NAS, _______Consistent Carbohydrate, _______NPO _____Other    . OXYGEN Inhale 4 L into the lungs continuous.    . pantoprazole (PROTONIX) 40 MG tablet Take 1 tablet (40 mg total) by mouth 2 (two) times daily. (Patient taking differently: Take 40 mg by mouth daily.) 90 tablet 1  . polyvinyl alcohol (LIQUIFILM TEARS) 1.4 % ophthalmic solution Place 1 drop  into both eyes 3 (three) times daily as needed for dry eyes.    . predniSONE (DELTASONE) 10 MG tablet Take 1 tablet (10 mg total) by mouth daily with breakfast. HOLD while on Prednisone taper    . tamsulosin (FLOMAX) 0.4 MG CAPS capsule TAKE 1 CAPSULE BY MOUTH EVERY DAY 90 capsule 3   No current facility-administered medications on file prior to visit.   Allergies  Allergen Reactions  . Morphine Other (See Comments)    REACTION: sweats   Social History   Socioeconomic History  . Marital status: Married    Spouse name: Not  on file  . Number of children: Not on file  . Years of education: Not on file  . Highest education level: Not on file  Occupational History  . Occupation: Chief Financial Officer  Tobacco Use  . Smoking status: Former Smoker    Packs/day: 1.50    Years: 50.00    Pack years: 75.00    Types: Cigarettes    Quit date: 09/17/2008    Years since quitting: 12.3  . Smokeless tobacco: Never Used  Vaping Use  . Vaping Use: Never used  Substance and Sexual Activity  . Alcohol use: Yes    Alcohol/week: 1.0 standard drink    Types: 1 Glasses of wine per week    Comment: each evening  . Drug use: No  . Sexual activity: Not on file  Other Topics Concern  . Not on file  Social History Narrative  . Not on file   Social Determinants of Health   Financial Resource Strain: Low Risk   . Difficulty of Paying Living Expenses: Not very hard  Food Insecurity: Not on file  Transportation Needs: Not on file  Physical Activity: Not on file  Stress: Not on file  Social Connections: Not on file  Intimate Partner Violence: Not on file      Review of Systems  Skin: Positive for rash.  All other systems reviewed and are negative.      Objective:   Physical Exam Vitals reviewed.  Constitutional:      General: He is not in acute distress.    Appearance: Normal appearance. He is well-developed and underweight. He is not diaphoretic.  Cardiovascular:     Rate and Rhythm:  Tachycardia present. Rhythm irregular.     Heart sounds: Normal heart sounds. No murmur heard.   Pulmonary:     Effort: Tachypnea present. No accessory muscle usage, respiratory distress or retractions.     Breath sounds: Decreased air movement present. Examination of the right-upper field reveals decreased breath sounds and rales. Examination of the left-upper field reveals decreased breath sounds. Examination of the right-middle field reveals decreased breath sounds. Examination of the left-middle field reveals decreased breath sounds. Examination of the right-lower field reveals decreased breath sounds. Examination of the left-lower field reveals decreased breath sounds. Decreased breath sounds and rales present. No wheezing or rhonchi.  Chest:     Chest wall: No mass, lacerations, deformity, swelling, tenderness, crepitus or edema.  Abdominal:     General: Bowel sounds are normal. There is no distension.     Palpations: Abdomen is soft.     Tenderness: There is no abdominal tenderness. There is no guarding or rebound.     Hernia: No hernia is present.  Musculoskeletal:     Cervical back: Neck supple.     Right lower leg: Edema present.     Left lower leg: Edema present.  Lymphadenopathy:     Cervical: No cervical adenopathy.  Neurological:     Mental Status: He is alert.  Psychiatric:        Behavior: Behavior is cooperative.           Assessment & Plan:   Leg swelling  I feel that the reason the patient is having leg swelling is due to a combination of factors.  I believe that he has dependent edema in his feet from sitting all throughout the day.  Ever since he broke his hip he spends the majority of his day sitting in a wheelchair.  I believe this is the  biggest cause of his edema.  Other contributing factors include a suppressed ejection fraction of 45 to 50%, and likely some right-sided heart failure due to severe pulmonary disease.  However at the present time, the  remainder of the patient's exam suggest dehydration.  His mucous membranes appear dry.  His blood pressure is low for him.  Skin turgor is decreased.  Therefore I hesitate to increase any diuretics.  Instead I have suggested compression hose which she is not wearing to help with pitting edema and also suggested leg exercises to help increase venous return from the lower extremities.  I do not see a role for cardiology at this point.  We can certainly consider adding an ARB due to a suppressed ejection fraction if his blood pressure can tolerate it.  I would hesitate to add a beta-blocker due to his severe COPD.  However I do not see a role for cardiac intervention at this time.  The family will try this before going to see cardiology as it is difficult for them to get back and forth to doctor offices.

## 2021-01-15 DIAGNOSIS — Z96641 Presence of right artificial hip joint: Secondary | ICD-10-CM | POA: Diagnosis not present

## 2021-01-15 DIAGNOSIS — E441 Mild protein-calorie malnutrition: Secondary | ICD-10-CM | POA: Diagnosis not present

## 2021-01-15 DIAGNOSIS — J9611 Chronic respiratory failure with hypoxia: Secondary | ICD-10-CM | POA: Diagnosis not present

## 2021-01-15 DIAGNOSIS — I700 Atherosclerosis of aorta: Secondary | ICD-10-CM | POA: Diagnosis not present

## 2021-01-15 DIAGNOSIS — W19XXXD Unspecified fall, subsequent encounter: Secondary | ICD-10-CM | POA: Diagnosis not present

## 2021-01-15 DIAGNOSIS — Z9181 History of falling: Secondary | ICD-10-CM | POA: Diagnosis not present

## 2021-01-15 DIAGNOSIS — S72001D Fracture of unspecified part of neck of right femur, subsequent encounter for closed fracture with routine healing: Secondary | ICD-10-CM | POA: Diagnosis not present

## 2021-01-15 DIAGNOSIS — M4802 Spinal stenosis, cervical region: Secondary | ICD-10-CM | POA: Diagnosis not present

## 2021-01-15 DIAGNOSIS — Z8546 Personal history of malignant neoplasm of prostate: Secondary | ICD-10-CM | POA: Diagnosis not present

## 2021-01-15 DIAGNOSIS — M47812 Spondylosis without myelopathy or radiculopathy, cervical region: Secondary | ICD-10-CM | POA: Diagnosis not present

## 2021-01-15 DIAGNOSIS — J439 Emphysema, unspecified: Secondary | ICD-10-CM | POA: Diagnosis not present

## 2021-01-15 DIAGNOSIS — E039 Hypothyroidism, unspecified: Secondary | ICD-10-CM | POA: Diagnosis not present

## 2021-01-15 DIAGNOSIS — J189 Pneumonia, unspecified organism: Secondary | ICD-10-CM | POA: Diagnosis not present

## 2021-01-17 ENCOUNTER — Other Ambulatory Visit: Payer: Self-pay | Admitting: Family Medicine

## 2021-01-21 DIAGNOSIS — R2681 Unsteadiness on feet: Secondary | ICD-10-CM | POA: Diagnosis not present

## 2021-01-21 DIAGNOSIS — J439 Emphysema, unspecified: Secondary | ICD-10-CM | POA: Diagnosis not present

## 2021-01-21 DIAGNOSIS — J9611 Chronic respiratory failure with hypoxia: Secondary | ICD-10-CM | POA: Diagnosis not present

## 2021-01-24 ENCOUNTER — Other Ambulatory Visit: Payer: Self-pay | Admitting: Family Medicine

## 2021-01-25 NOTE — Telephone Encounter (Signed)
Medication D/C from hospital.   Ok to refill?

## 2021-01-30 ENCOUNTER — Other Ambulatory Visit: Payer: Self-pay | Admitting: Family Medicine

## 2021-01-30 MED ORDER — DIPHENOXYLATE-ATROPINE 2.5-0.025 MG PO TABS: 1.0000 | ORAL_TABLET | Freq: Four times a day (QID) | ORAL | 0 refills | Status: DC | PRN

## 2021-01-31 ENCOUNTER — Telehealth: Payer: Self-pay | Admitting: Family Medicine

## 2021-02-07 ENCOUNTER — Ambulatory Visit (INDEPENDENT_AMBULATORY_CARE_PROVIDER_SITE_OTHER): Payer: PPO | Admitting: Family Medicine

## 2021-02-07 ENCOUNTER — Encounter: Payer: Self-pay | Admitting: Family Medicine

## 2021-02-07 ENCOUNTER — Other Ambulatory Visit: Payer: Self-pay

## 2021-02-07 VITALS — BP 110/58 | HR 104 | Temp 99.1°F | Resp 20

## 2021-02-07 DIAGNOSIS — A09 Infectious gastroenteritis and colitis, unspecified: Secondary | ICD-10-CM | POA: Diagnosis not present

## 2021-02-07 DIAGNOSIS — R7309 Other abnormal glucose: Secondary | ICD-10-CM | POA: Diagnosis not present

## 2021-02-07 MED ORDER — VANCOMYCIN HCL 125 MG PO CAPS: 125.0000 mg | ORAL_CAPSULE | Freq: Four times a day (QID) | ORAL | 0 refills | Status: AC

## 2021-02-07 NOTE — Progress Notes (Signed)
Subjective:    Patient ID: John Black, male    DOB: 02/17/1929, 87 y.o.   [name]: [id number]  Patient was recently discharged from the [facility].  However he has been home about 2 months now.  However over the last 2 to 3 weeks he has developed severe diarrhea.  He is going to the bathroom up to 7 times a day.  He is having watery diarrhea and is at times having fecal incontinence.  He has been taking Lomotil and Imodium without any benefit.  He denies any fevers or chills or abdominal pain.  He denies any vomiting.  He denies any nausea.  He denies any fever or chills.  He denies any blood in his stool.  Today on exam, his mucous membranes appear to be dry and his skin turgor suggest dehydration.  He is taking Lasix for leg swelling.  He is also not drinking very much. Past Medical History:  Diagnosis Date  . Allergic rhinitis   . Cataract    s/p removal  . Chronic respiratory failure (HCC)    oxygen 3L at home  . Emphysema   . Hypothyroid   . PNA (pneumonia)   . Prostate cancer [facility])    Past Surgical History:  Procedure Laterality Date  . ADENOIDECTOMY    . HIP ARTHROPLASTY Right 10/10/2020   Procedure: ARTHROPLASTY BIPOLAR HIP (HEMIARTHROPLASTY);  Surgeon: Mordecai Rasmussen, MD;  Location: [facility];  Service: Orthopedics;  Laterality: Right;  . ROTATOR CUFF REPAIR  1990,2009   bilateral  . Seed implant for prostate cancer  2000  . TONSILLECTOMY    . TRANSURETHRAL RESECTION OF PROSTATE  2001   x2   Current Outpatient Medications on File Prior to Visit  Medication Sig Dispense Refill  . acetaminophen (TYLENOL) 650 MG CR tablet Take 650 mg by mouth 3 (three) times daily.    Marland Kitchen albuterol (ACCUNEB) 0.63 MG/3ML nebulizer solution Take 1 ampule by nebulization every 4 (four) hours as needed for wheezing.    . Albuterol Sulfate (PROAIR RESPICLICK) 614 (90 Base) MCG/ACT AEPB Inhale 2 puffs into the lungs every 4 (four) hours as needed.    Marland Kitchen aspirin EC 81 MG tablet Take 81 mg by mouth.  Twice a day from 10/12/2020-11/22/2020 Then take once a day starting 11/23/2020    . cholecalciferol (VITAMIN D3) 25 MCG (1000 UNIT) tablet Take 1,000 Units by mouth daily.    . diphenoxylate-atropine (LOMOTIL) 2.5-0.025 MG tablet Take 1 tablet by mouth 4 (four) times daily as needed for diarrhea or loose stools. 30 tablet 0  . Ensure (ENSURE) Take 237 mLs by mouth daily.    . Fluticasone-Umeclidin-Vilant (TRELEGY ELLIPTA) 100-62.5-25 MCG/INH AEPB Inhale 1 puff into the lungs daily. 60 each 11  . Fluticasone-Umeclidin-Vilant (TRELEGY ELLIPTA) 100-62.5-25 MCG/INH AEPB Inhale 1 puff into the lungs daily. 7 each 0  . furosemide (LASIX) 40 MG tablet TAKE 1 TABLET BY MOUTH EVERY DAY 30 tablet 1  . guaiFENesin (MUCINEX) 600 MG 12 hr tablet Take 600 mg by mouth 2 (two) times daily as needed for cough.    . levothyroxine (SYNTHROID) 75 MCG tablet TAKE 1 TABLET (75 MCG TOTAL) BY MOUTH DAILY. 90 tablet 3  . LORazepam (ATIVAN) 0.5 MG tablet TAKE 1 TABLET (0.5 MG TOTAL) BY MOUTH 2 (TWO) TIMES DAILY AS NEEDED. 30 tablet 0  . mirtazapine (REMERON) 15 MG tablet TAKE 1/2 TABLET BY MOUTH IN THE EVENING 45 tablet 2  . NON FORMULARY Diet: _____ Regular, ___x___ NAS,  _______Consistent Carbohydrate, _______NPO _____Other    . OXYGEN Inhale 4 L into the lungs continuous.    . pantoprazole (PROTONIX) 40 MG tablet Take 1 tablet (40 mg total) by mouth 2 (two) times daily. (Patient taking differently: Take 40 mg by mouth daily.) 90 tablet 1  . polyvinyl alcohol (LIQUIFILM TEARS) 1.4 % ophthalmic solution Place 1 drop into both eyes 3 (three) times daily as needed for dry eyes.    . predniSONE (DELTASONE) 10 MG tablet Take 1 tablet (10 mg total) by mouth daily with breakfast. HOLD while on Prednisone taper    . tamsulosin (FLOMAX) 0.4 MG CAPS capsule TAKE 1 CAPSULE BY MOUTH EVERY DAY 90 capsule 3   No current facility-administered medications on file prior to visit.   Allergies  Allergen Reactions  . Morphine Other (See  Comments)    REACTION: sweats   Social History   Socioeconomic History  . Marital status: Married    Spouse name: Not on file  . Number of children: Not on file  . Years of education: Not on file  . Highest education level: Not on file  Occupational History  . Occupation: Chief Financial Officer  Tobacco Use  . Smoking status: Former Smoker    Packs/day: 1.50    Years: 50.00    Pack years: 75.00    Types: Cigarettes    Quit date: 09/17/2008    Years since quitting: 12.4  . Smokeless tobacco: Never Used  Vaping Use  . Vaping Use: Never used  Substance and Sexual Activity  . Alcohol use: Yes    Alcohol/week: 1.0 standard drink    Types: 1 Glasses of wine per week    Comment: each evening  . Drug use: No  . Sexual activity: Not on file  Other Topics Concern  . Not on file  Social History Narrative  . Not on file   Social Determinants of Health   Financial Resource Strain: Low Risk   . Difficulty of Paying Living Expenses: Not very hard  Food Insecurity: Not on file  Transportation Needs: Not on file  Physical Activity: Not on file  Stress: Not on file  Social Connections: Not on file  Intimate Partner Violence: Not on file      Review of Systems  Skin: Positive for rash.  All other systems reviewed and are negative.      Objective:   Physical Exam Vitals reviewed.  Constitutional:      General: He is not in acute distress.    Appearance: Normal appearance. He is well-developed and underweight. He is not diaphoretic.  HENT:     Mouth/Throat:     Mouth: Mucous membranes are dry.  Cardiovascular:     Rate and Rhythm: Tachycardia present. Rhythm irregular.     Heart sounds: Normal heart sounds. No murmur heard.   Pulmonary:     Effort: Tachypnea present. No accessory muscle usage, respiratory distress or retractions.     Breath sounds: Decreased air movement present. Examination of the right-upper field reveals decreased breath sounds. Examination of the left-upper  field reveals decreased breath sounds. Examination of the right-middle field reveals decreased breath sounds. Examination of the left-middle field reveals decreased breath sounds. Examination of the right-lower field reveals decreased breath sounds. Examination of the left-lower field reveals decreased breath sounds. Decreased breath sounds present. No wheezing, rhonchi or rales.  Chest:     Chest wall: No mass, lacerations, deformity, swelling, tenderness, crepitus or edema.  Abdominal:     General:  Bowel sounds are normal. There is no distension.     Palpations: Abdomen is soft.     Tenderness: There is no abdominal tenderness. There is no guarding or rebound.     Hernia: No hernia is present.  Musculoskeletal:     Cervical back: Neck supple.     Right lower leg: Edema present.     Left lower leg: Edema present.  Lymphadenopathy:     Cervical: No cervical adenopathy.  Neurological:     Mental Status: He is alert.  Psychiatric:        Behavior: Behavior is cooperative.           Assessment & Plan:   Diarrhea of infectious origin - Plan: Stool culture, COMPLETE METABOLIC PANEL WITH GFR, CBC with Differential/Platelet, C. difficile GDH and Toxin A/B, Gastrointestinal Pathogen Panel PCR  Given the severity of the diarrhea and his recent hospital stay and admission to a nursing center, I am concerned about possible C. difficile.  I will send a GI pathogen panel, stool for C. difficile toxin, and also stool culture.  Check CBC and CMP.  Once the patient has brought in his stool sample, I want him to start vancomycin 125 mg p.o. 4 times daily empirically as I have a high level of suspicion for C. difficile.  I want him to stop Lasix due to dehydration and I want him to push Gatorade.  If he cannot drink sufficient fluids, the patient may become dehydrated to the point that he needs to go to the hospital.  Therefore encourage the patient to try to drink more to avoid this.  Check a CMP to  monitor his renal function and potassium.

## 2021-02-08 ENCOUNTER — Other Ambulatory Visit: Payer: PPO

## 2021-02-08 DIAGNOSIS — A09 Infectious gastroenteritis and colitis, unspecified: Secondary | ICD-10-CM | POA: Diagnosis not present

## 2021-02-08 NOTE — Addendum Note (Signed)
Addended by: Sheral Flow on: 02/08/2021 11:41 AM   Modules accepted: Orders

## 2021-02-09 ENCOUNTER — Other Ambulatory Visit: Payer: Self-pay | Admitting: *Deleted

## 2021-02-09 MED ORDER — DIPHENOXYLATE-ATROPINE 2.5-0.025 MG PO TABS: 1.0000 | ORAL_TABLET | Freq: Four times a day (QID) | ORAL | 0 refills | Status: DC | PRN

## 2021-02-09 NOTE — Telephone Encounter (Signed)
Received call from patient wife, Jaci Standard.   Requested refill on Lomotil  Ok to refill?

## 2021-02-10 ENCOUNTER — Other Ambulatory Visit: Payer: Self-pay | Admitting: Family Medicine

## 2021-02-10 LAB — C. DIFFICILE GDH AND TOXIN A/B
GDH ANTIGEN: NOT DETECTED
MICRO NUMBER:: 11938768
SPECIMEN QUALITY:: ADEQUATE
TOXIN A AND B: NOT DETECTED

## 2021-02-10 LAB — OVA AND PARASITE EXAMINATION
CONCENTRATE RESULT:: NONE SEEN
MICRO NUMBER:: 11933589
SPECIMEN QUALITY:: ADEQUATE
TRICHROME RESULT:: NONE SEEN

## 2021-02-10 LAB — SALMONELLA AND SHIGELLA,CULTURE
MICRO NUMBER:: 11933590
SPECIMEN QUALITY:: ADEQUATE

## 2021-02-10 NOTE — Telephone Encounter (Signed)
Ok to refill??  Last office visit 02/07/2021.  Last refill 01/26/2021. 

## 2021-02-11 LAB — CBC WITH DIFFERENTIAL/PLATELET
Absolute Monocytes: 688 cells/uL (ref 200–950)
Basophils Absolute: 26 cells/uL (ref 0–200)
Basophils Relative: 0.3 %
Eosinophils Absolute: 0 cells/uL — ABNORMAL LOW (ref 15–500)
Eosinophils Relative: 0 %
HCT: 35.9 % — ABNORMAL LOW (ref 38.5–50.0)
Hemoglobin: 12 g/dL — ABNORMAL LOW (ref 13.2–17.1)
Lymphs Abs: 292 cells/uL — ABNORMAL LOW (ref 850–3900)
MCH: 30.2 pg (ref 27.0–33.0)
MCHC: 33.4 g/dL (ref 32.0–36.0)
MCV: 90.2 fL (ref 80.0–100.0)
MPV: 10 fL (ref 7.5–12.5)
Monocytes Relative: 8 %
Neutro Abs: 7594 cells/uL (ref 1500–7800)
Neutrophils Relative %: 88.3 %
Platelets: 351 10*3/uL (ref 140–400)
RBC: 3.98 10*6/uL — ABNORMAL LOW (ref 4.20–5.80)
RDW: 13.1 % (ref 11.0–15.0)
Total Lymphocyte: 3.4 %
WBC: 8.6 10*3/uL (ref 3.8–10.8)

## 2021-02-11 LAB — COMPLETE METABOLIC PANEL WITH GFR
AG Ratio: 1.9 (calc) (ref 1.0–2.5)
ALT: 21 U/L (ref 9–46)
AST: 21 U/L (ref 10–35)
Albumin: 3.8 g/dL (ref 3.6–5.1)
Alkaline phosphatase (APISO): 54 U/L (ref 35–144)
BUN: 22 mg/dL (ref 7–25)
CO2: 33 mmol/L — ABNORMAL HIGH (ref 20–32)
Calcium: 9.2 mg/dL (ref 8.6–10.3)
Chloride: 100 mmol/L (ref 98–110)
Creat: 1.01 mg/dL (ref 0.70–1.11)
GFR, Est African American: 76 mL/min/{1.73_m2} (ref >=60)
GFR, Est Non African American: 66 mL/min/{1.73_m2} (ref >=60)
Globulin: 2 g/dL (calc) (ref 1.9–3.7)
Glucose, Bld: 224 mg/dL — ABNORMAL HIGH (ref 65–99)
Potassium: 4 mmol/L (ref 3.5–5.3)
Sodium: 141 mmol/L (ref 135–146)
Total Bilirubin: 0.2 mg/dL (ref 0.2–1.2)
Total Protein: 5.8 g/dL — ABNORMAL LOW (ref 6.1–8.1)

## 2021-02-11 LAB — TEST AUTHORIZATION

## 2021-02-11 LAB — HEMOGLOBIN A1C W/OUT EAG: Hgb A1c MFr Bld: 5.9 % of total Hgb — ABNORMAL HIGH (ref <5.7)

## 2021-02-14 ENCOUNTER — Other Ambulatory Visit: Payer: Self-pay

## 2021-02-14 ENCOUNTER — Telehealth: Payer: Self-pay

## 2021-02-14 DIAGNOSIS — A09 Infectious gastroenteritis and colitis, unspecified: Secondary | ICD-10-CM

## 2021-02-15 ENCOUNTER — Encounter: Payer: Self-pay | Admitting: *Deleted

## 2021-02-16 ENCOUNTER — Encounter (INDEPENDENT_AMBULATORY_CARE_PROVIDER_SITE_OTHER): Payer: Self-pay | Admitting: *Deleted

## 2021-02-17 ENCOUNTER — Other Ambulatory Visit: Payer: Self-pay | Admitting: Family Medicine

## 2021-02-17 ENCOUNTER — Telehealth: Payer: Self-pay | Admitting: Pharmacist

## 2021-02-17 ENCOUNTER — Other Ambulatory Visit: Payer: Self-pay | Admitting: *Deleted

## 2021-02-17 MED ORDER — DIPHENOXYLATE-ATROPINE 2.5-0.025 MG PO TABS: 1.0000 | ORAL_TABLET | Freq: Four times a day (QID) | ORAL | 0 refills | Status: DC | PRN

## 2021-02-17 NOTE — Telephone Encounter (Signed)
Recommend gi consult if he is still having diarrhea.

## 2021-02-17 NOTE — Telephone Encounter (Signed)
-----   Message from Rosetta Posner sent at 02/17/2021  9:36 AM EDT ----- Regarding: Medication Refill Hi I recently spoke with the patients wife and she stated she is about to run out of his Diarrhea medication diphenoxylate atropine and is requesting a refill.

## 2021-02-17 NOTE — Telephone Encounter (Signed)
Referral orders have been placed and the patient is aware.

## 2021-02-17 NOTE — Telephone Encounter (Signed)
Ok to refill 

## 2021-02-17 NOTE — Progress Notes (Addendum)
Chronic Care Management Pharmacy Assistant   Name: John Black  [name]: [id number] DOB: August 30, 1929  Reason for Encounter: General Disease State Call   Conditions to be addressed/monitored: allergic rhinitis, COPD, chronic diarrhea, hypothyroidism, BPH, mild hyperlipidemia.  Recent office visits:  02/07/21 Dr. Dennard Schaumann For diarrhea of infectious origin. Labs drawn. STARTED Vancomycin 125 mg 4 times daily. Per note: The doctor wants him to stop Lasix due to dehydration and I want him to push Gatorade 01/12/21 Dr. Dennard Schaumann. For leg swelling. No medication changes.   Recent consult visits:  None since 01/05/21  Hospital visits:  None since 01/05/21  Medications: Outpatient Encounter Medications as of 02/17/2021  Medication Sig   acetaminophen (TYLENOL) 650 MG CR tablet Take 650 mg by mouth 3 (three) times daily.   albuterol (ACCUNEB) 0.63 MG/3ML nebulizer solution Take 1 ampule by nebulization every 4 (four) hours as needed for wheezing.   Albuterol Sulfate (PROAIR RESPICLICK) 638 (90 Base) MCG/ACT AEPB Inhale 2 puffs into the lungs every 4 (four) hours as needed.   aspirin EC 81 MG tablet Take 81 mg by mouth. Twice a day from 10/12/2020-11/22/2020 Then take once a day starting 11/23/2020   cholecalciferol (VITAMIN D3) 25 MCG (1000 UNIT) tablet Take 1,000 Units by mouth daily.   diphenoxylate-atropine (LOMOTIL) 2.5-0.025 MG tablet Take 1 tablet by mouth 4 (four) times daily as needed for diarrhea or loose stools.   Ensure (ENSURE) Take 237 mLs by mouth daily.   Fluticasone-Umeclidin-Vilant (TRELEGY ELLIPTA) 100-62.5-25 MCG/INH AEPB Inhale 1 puff into the lungs daily.   Fluticasone-Umeclidin-Vilant (TRELEGY ELLIPTA) 100-62.5-25 MCG/INH AEPB Inhale 1 puff into the lungs daily.   furosemide (LASIX) 40 MG tablet TAKE 1 TABLET BY MOUTH EVERY DAY   guaiFENesin (MUCINEX) 600 MG 12 hr tablet Take 600 mg by mouth 2 (two) times daily as needed for cough.   levothyroxine (SYNTHROID) 75 MCG tablet TAKE  1 TABLET (75 MCG TOTAL) BY MOUTH DAILY.   LORazepam (ATIVAN) 0.5 MG tablet TAKE 1 TABLET BY MOUTH 2 TIMES DAILY AS NEEDED.   mirtazapine (REMERON) 15 MG tablet TAKE 1/2 TABLET BY MOUTH IN THE EVENING   NON FORMULARY Diet: _____ Regular, ___x___ NAS, _______Consistent Carbohydrate, _______NPO _____Other   OXYGEN Inhale 4 L into the lungs continuous.   pantoprazole (PROTONIX) 40 MG tablet Take 1 tablet (40 mg total) by mouth 2 (two) times daily. (Patient taking differently: Take 40 mg by mouth daily.)   polyvinyl alcohol (LIQUIFILM TEARS) 1.4 % ophthalmic solution Place 1 drop into both eyes 3 (three) times daily as needed for dry eyes.   predniSONE (DELTASONE) 10 MG tablet Take 1 tablet (10 mg total) by mouth daily with breakfast. HOLD while on Prednisone taper   tamsulosin (FLOMAX) 0.4 MG CAPS capsule TAKE 1 CAPSULE BY MOUTH EVERY DAY   vancomycin (VANCOCIN) 125 MG capsule Take 1 capsule (125 mg total) by mouth 4 (four) times daily for 10 days.   No facility-administered encounter medications on file as of 02/17/2021.   GEN CALL: Patients wife stated he has been having diarrhea for two months. She stated he has had two bed spills in the last two days. Patients wife stated he is eating pretty good even under the circumstances. She stated he is drinking a lot of Gatorade and water.  She stated she needs a refill on his diarrhea medication and I informed her I would request it for her. She stated they were still waiting to go see the GI specialist. Patients wife  stated she believes his Trelegy Ellipta goes into the donut hole in July/August and will let me know when so we can start PAP.  Patients wife stated he is starting to have some problems wit his memory and it seems worse at night.  She stated she STOPPED giving him Protonix to see if it would help with his diarrhea.. She stated she started back giving him lasix because his feet started swelling back up as soon as he stopped taking it. He eats  oatmeal every morning and she stated it seems to help decrease his diarrhea.   Star Rating Drugs:None.  Follow-Up:Pharmacist Review   Charlann Lange, RMA Clinical Pharmacist Assistant [phone]  10 minutes spent in review, coordination, and documentation.  Reviewed by: Beverly Milch, PharmD Clinical Pharmacist [facility] [phone]

## 2021-02-21 DIAGNOSIS — J9611 Chronic respiratory failure with hypoxia: Secondary | ICD-10-CM | POA: Diagnosis not present

## 2021-02-21 DIAGNOSIS — J439 Emphysema, unspecified: Secondary | ICD-10-CM | POA: Diagnosis not present

## 2021-02-21 DIAGNOSIS — R2681 Unsteadiness on feet: Secondary | ICD-10-CM | POA: Diagnosis not present

## 2021-02-24 ENCOUNTER — Ambulatory Visit: Payer: PPO | Admitting: Gastroenterology

## 2021-02-24 NOTE — Telephone Encounter (Signed)
Error

## 2021-02-27 ENCOUNTER — Telehealth: Payer: Self-pay | Admitting: Family Medicine

## 2021-02-27 ENCOUNTER — Other Ambulatory Visit: Payer: Self-pay | Admitting: Adult Health

## 2021-02-27 ENCOUNTER — Other Ambulatory Visit: Payer: Self-pay | Admitting: Family Medicine

## 2021-02-27 DIAGNOSIS — J449 Chronic obstructive pulmonary disease, unspecified: Secondary | ICD-10-CM

## 2021-02-27 NOTE — Telephone Encounter (Signed)
Ok to refill?  GI appt scheduled for 03/01/2021.

## 2021-02-27 NOTE — Telephone Encounter (Signed)
Received call from [place], Case Manager with [facility].   Reports that patient will be admitted to hospice services with admitting Dx: COPD.   Inquired if MD will remain primary. Advised that MD will remain primary and hospice director can manage pain and emergent issues.

## 2021-02-27 NOTE — Telephone Encounter (Signed)
Pt daughter called stating that they have decided to look into getting Hospice for pt. They have reached out to [facility], and were told that they would contacting this office to get orders for pt. Pt's daughter also asked if pt could get a refill of diarrhea medicine as well as prednisone. Pts daughter also stated that if Dr would like for them to schedule a visit, could it be virtually as they are struggling to get pt up. Please advise asap.  Cb#: [phone]

## 2021-03-01 ENCOUNTER — Ambulatory Visit: Payer: PPO | Admitting: Physician Assistant

## 2021-03-02 NOTE — Telephone Encounter (Signed)
Received call from [place] with [facility]. They've decided to refer patient to palliative care instead of hospice. Colletta Maryland called to ask if John Black will be the attending. Please advise at [phone]

## 2021-03-03 ENCOUNTER — Other Ambulatory Visit: Payer: Self-pay

## 2021-03-03 ENCOUNTER — Telehealth: Payer: Self-pay | Admitting: Internal Medicine

## 2021-03-03 MED ORDER — PREDNISONE 10 MG PO TABS: 10.0000 mg | ORAL_TABLET | Freq: Every day | ORAL | 0 refills | Status: DC

## 2021-03-03 NOTE — Telephone Encounter (Signed)
Called and spoke with Patient wife, Jaci Standard per DPR on file, that they are in need of a refill for prednisone. Refill sent in per Dr Melvyn Novas office note on 12/19/20 "Continue trelegy and the lowest dose of pred maint feasible = ok to ty 10/5 on alternating days and saba prn "  Pharmacy is [facility] on [address].  Wife called and made aware to which wife expressed full understanding as to how to take the prednisone per Dr Melvyn Novas last office note. Nothing further needed at this time.

## 2021-03-03 NOTE — Telephone Encounter (Signed)
Call placed to [facility].   VO given.   Orders placed via Epic.

## 2021-03-08 DIAGNOSIS — J449 Chronic obstructive pulmonary disease, unspecified: Secondary | ICD-10-CM | POA: Diagnosis not present

## 2021-03-08 DIAGNOSIS — Z515 Encounter for palliative care: Secondary | ICD-10-CM | POA: Diagnosis not present

## 2021-03-08 DIAGNOSIS — J9611 Chronic respiratory failure with hypoxia: Secondary | ICD-10-CM | POA: Diagnosis not present

## 2021-03-21 DIAGNOSIS — R634 Abnormal weight loss: Secondary | ICD-10-CM | POA: Diagnosis not present

## 2021-03-21 DIAGNOSIS — L03114 Cellulitis of left upper limb: Secondary | ICD-10-CM | POA: Diagnosis not present

## 2021-03-21 DIAGNOSIS — Z515 Encounter for palliative care: Secondary | ICD-10-CM | POA: Diagnosis not present

## 2021-03-21 DIAGNOSIS — J9611 Chronic respiratory failure with hypoxia: Secondary | ICD-10-CM | POA: Diagnosis not present

## 2021-03-21 DIAGNOSIS — J449 Chronic obstructive pulmonary disease, unspecified: Secondary | ICD-10-CM | POA: Diagnosis not present

## 2021-03-22 ENCOUNTER — Ambulatory Visit (INDEPENDENT_AMBULATORY_CARE_PROVIDER_SITE_OTHER): Payer: PPO | Admitting: Gastroenterology

## 2021-03-23 DIAGNOSIS — J9611 Chronic respiratory failure with hypoxia: Secondary | ICD-10-CM | POA: Diagnosis not present

## 2021-03-23 DIAGNOSIS — R2681 Unsteadiness on feet: Secondary | ICD-10-CM | POA: Diagnosis not present

## 2021-03-23 DIAGNOSIS — J439 Emphysema, unspecified: Secondary | ICD-10-CM | POA: Diagnosis not present

## 2021-03-28 DIAGNOSIS — Z515 Encounter for palliative care: Secondary | ICD-10-CM | POA: Diagnosis not present

## 2021-03-28 DIAGNOSIS — J449 Chronic obstructive pulmonary disease, unspecified: Secondary | ICD-10-CM | POA: Diagnosis not present

## 2021-03-28 DIAGNOSIS — R634 Abnormal weight loss: Secondary | ICD-10-CM | POA: Diagnosis not present

## 2021-03-28 DIAGNOSIS — L03114 Cellulitis of left upper limb: Secondary | ICD-10-CM | POA: Diagnosis not present

## 2021-03-28 DIAGNOSIS — J9611 Chronic respiratory failure with hypoxia: Secondary | ICD-10-CM | POA: Diagnosis not present

## 2021-04-07 DIAGNOSIS — R229 Localized swelling, mass and lump, unspecified: Secondary | ICD-10-CM | POA: Diagnosis not present

## 2021-04-07 DIAGNOSIS — R634 Abnormal weight loss: Secondary | ICD-10-CM | POA: Diagnosis not present

## 2021-04-07 DIAGNOSIS — Z515 Encounter for palliative care: Secondary | ICD-10-CM | POA: Diagnosis not present

## 2021-04-07 DIAGNOSIS — J449 Chronic obstructive pulmonary disease, unspecified: Secondary | ICD-10-CM | POA: Diagnosis not present

## 2021-04-07 DIAGNOSIS — J9611 Chronic respiratory failure with hypoxia: Secondary | ICD-10-CM | POA: Diagnosis not present

## 2021-04-15 ENCOUNTER — Other Ambulatory Visit: Payer: Self-pay | Admitting: Family Medicine

## 2021-04-18 NOTE — Telephone Encounter (Signed)
Ok to refill 

## 2021-04-20 DIAGNOSIS — L03116 Cellulitis of left lower limb: Secondary | ICD-10-CM | POA: Diagnosis not present

## 2021-04-20 DIAGNOSIS — J9611 Chronic respiratory failure with hypoxia: Secondary | ICD-10-CM | POA: Diagnosis not present

## 2021-04-20 DIAGNOSIS — J449 Chronic obstructive pulmonary disease, unspecified: Secondary | ICD-10-CM | POA: Diagnosis not present

## 2021-04-20 DIAGNOSIS — S91302A Unspecified open wound, left foot, initial encounter: Secondary | ICD-10-CM | POA: Diagnosis not present

## 2021-04-20 DIAGNOSIS — Z515 Encounter for palliative care: Secondary | ICD-10-CM | POA: Diagnosis not present

## 2021-04-20 DIAGNOSIS — R229 Localized swelling, mass and lump, unspecified: Secondary | ICD-10-CM | POA: Diagnosis not present

## 2021-04-23 ENCOUNTER — Other Ambulatory Visit: Payer: Self-pay | Admitting: Family Medicine

## 2021-04-24 ENCOUNTER — Other Ambulatory Visit: Payer: Self-pay | Admitting: Internal Medicine

## 2021-04-24 DIAGNOSIS — S91302A Unspecified open wound, left foot, initial encounter: Secondary | ICD-10-CM | POA: Diagnosis not present

## 2021-04-24 DIAGNOSIS — L03116 Cellulitis of left lower limb: Secondary | ICD-10-CM | POA: Diagnosis not present

## 2021-04-27 DIAGNOSIS — Z515 Encounter for palliative care: Secondary | ICD-10-CM | POA: Diagnosis not present

## 2021-04-27 DIAGNOSIS — S91302A Unspecified open wound, left foot, initial encounter: Secondary | ICD-10-CM | POA: Diagnosis not present

## 2021-04-27 DIAGNOSIS — L03116 Cellulitis of left lower limb: Secondary | ICD-10-CM | POA: Diagnosis not present

## 2021-04-28 ENCOUNTER — Other Ambulatory Visit: Payer: Self-pay

## 2021-04-28 ENCOUNTER — Emergency Department (HOSPITAL_COMMUNITY): Payer: PPO

## 2021-04-28 ENCOUNTER — Encounter (HOSPITAL_COMMUNITY): Payer: Self-pay | Admitting: *Deleted

## 2021-04-28 ENCOUNTER — Emergency Department (HOSPITAL_COMMUNITY)
Admission: EM | Admit: 2021-04-28 | Discharge: 2021-04-28 | Disposition: A | Discharge status: Home / Self Care | Payer: PPO | Attending: Emergency Medicine | Admitting: Emergency Medicine

## 2021-04-28 DIAGNOSIS — Z7982 Long term (current) use of aspirin: Secondary | ICD-10-CM | POA: Insufficient documentation

## 2021-04-28 DIAGNOSIS — M7989 Other specified soft tissue disorders: Secondary | ICD-10-CM | POA: Diagnosis not present

## 2021-04-28 DIAGNOSIS — Z7952 Long term (current) use of systemic steroids: Secondary | ICD-10-CM | POA: Diagnosis not present

## 2021-04-28 DIAGNOSIS — Z8546 Personal history of malignant neoplasm of prostate: Secondary | ICD-10-CM | POA: Insufficient documentation

## 2021-04-28 DIAGNOSIS — E039 Hypothyroidism, unspecified: Secondary | ICD-10-CM | POA: Insufficient documentation

## 2021-04-28 DIAGNOSIS — J449 Chronic obstructive pulmonary disease, unspecified: Secondary | ICD-10-CM | POA: Insufficient documentation

## 2021-04-28 DIAGNOSIS — B353 Tinea pedis: Secondary | ICD-10-CM | POA: Diagnosis not present

## 2021-04-28 DIAGNOSIS — L089 Local infection of the skin and subcutaneous tissue, unspecified: Secondary | ICD-10-CM | POA: Diagnosis not present

## 2021-04-28 DIAGNOSIS — M79672 Pain in left foot: Secondary | ICD-10-CM | POA: Diagnosis present

## 2021-04-28 DIAGNOSIS — Z87891 Personal history of nicotine dependence: Secondary | ICD-10-CM | POA: Diagnosis not present

## 2021-04-28 LAB — CBC WITH DIFFERENTIAL/PLATELET
Abs Immature Granulocytes: 0.13 10*3/uL — ABNORMAL HIGH (ref 0.00–0.07)
Basophils Absolute: 0 10*3/uL (ref 0.0–0.1)
Basophils Relative: 0 %
Eosinophils Absolute: 0 10*3/uL (ref 0.0–0.5)
Eosinophils Relative: 0 %
HCT: 40.8 % (ref 39.0–52.0)
Hemoglobin: 12.7 g/dL — ABNORMAL LOW (ref 13.0–17.0)
Immature Granulocytes: 2 %
Lymphocytes Relative: 12 %
Lymphs Abs: 0.9 10*3/uL (ref 0.7–4.0)
MCH: 29.6 pg (ref 26.0–34.0)
MCHC: 31.1 g/dL (ref 30.0–36.0)
MCV: 95.1 fL (ref 80.0–100.0)
Monocytes Absolute: 0.8 10*3/uL (ref 0.1–1.0)
Monocytes Relative: 10 %
Neutro Abs: 5.7 10*3/uL (ref 1.7–7.7)
Neutrophils Relative %: 76 %
Platelets: 363 10*3/uL (ref 150–400)
RBC: 4.29 MIL/uL (ref 4.22–5.81)
RDW: 14.6 % (ref 11.5–15.5)
WBC: 7.6 10*3/uL (ref 4.0–10.5)
nRBC: 0 % (ref 0.0–0.2)

## 2021-04-28 LAB — BASIC METABOLIC PANEL
Anion gap: 9 (ref 5–15)
BUN: 27 mg/dL — ABNORMAL HIGH (ref 8–23)
CO2: 35 mmol/L — ABNORMAL HIGH (ref 22–32)
Calcium: 9.5 mg/dL (ref 8.9–10.3)
Chloride: 93 mmol/L — ABNORMAL LOW (ref 98–111)
Creatinine, Ser: 0.98 mg/dL (ref 0.61–1.24)
GFR, Estimated: >60 mL/min (ref >60)
Glucose, Bld: 99 mg/dL (ref 70–99)
Potassium: 4.5 mmol/L (ref 3.5–5.1)
Sodium: 137 mmol/L (ref 135–145)

## 2021-04-28 IMAGING — DX DG FOOT COMPLETE 3+V*L*
3 series · 3 of 3 positions shown · non-contrast
Comparison: None.

CLINICAL DATA: Left foot infection.  Pain.

EXAM:
LEFT FOOT - COMPLETE 3+ VIEW

[foot ap]
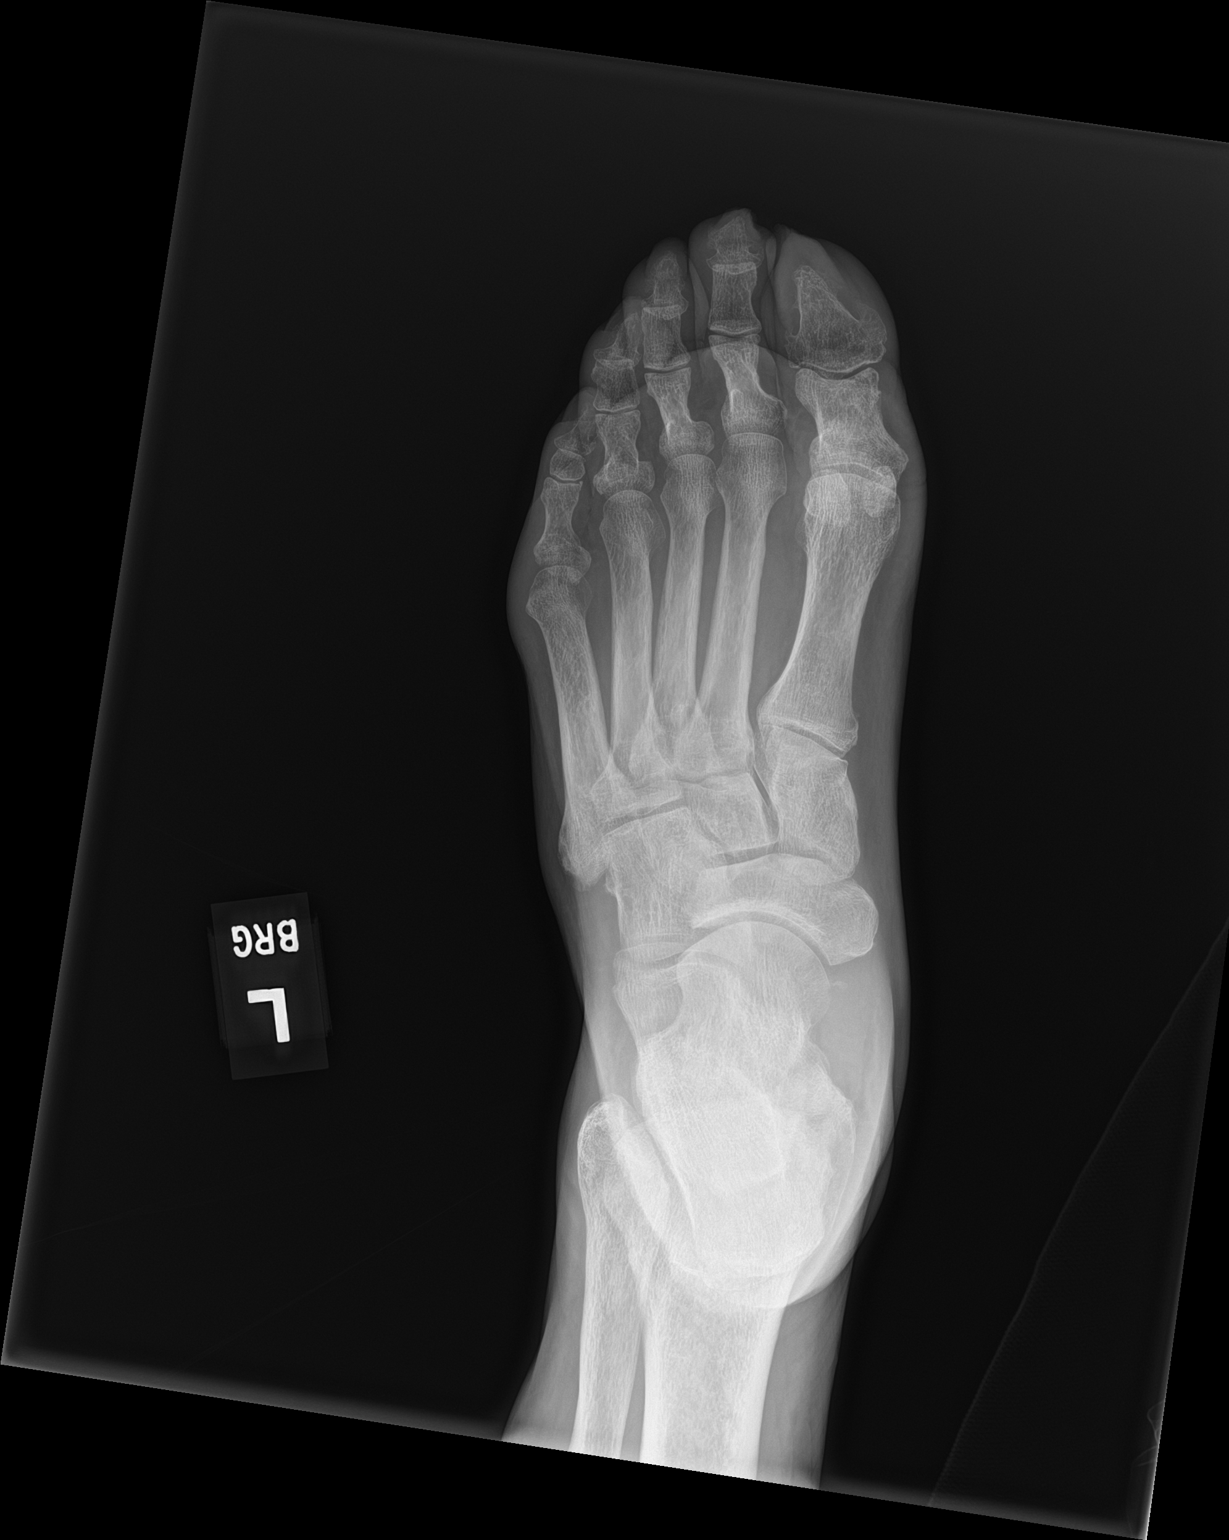

[foot obl]
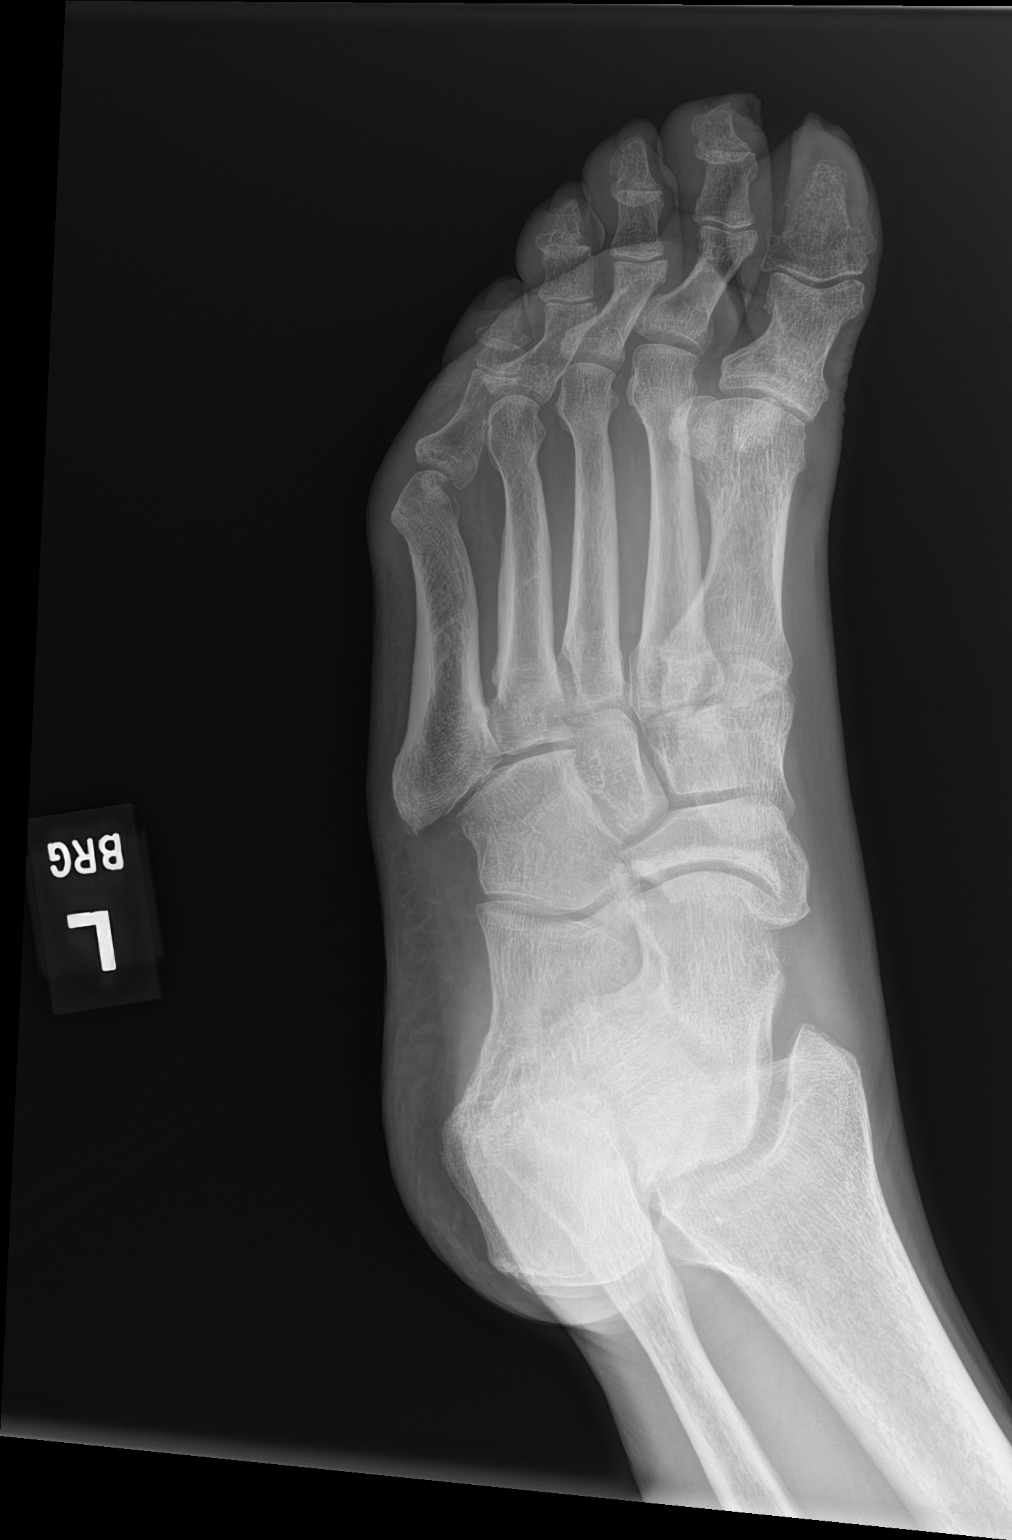

[foot lat]
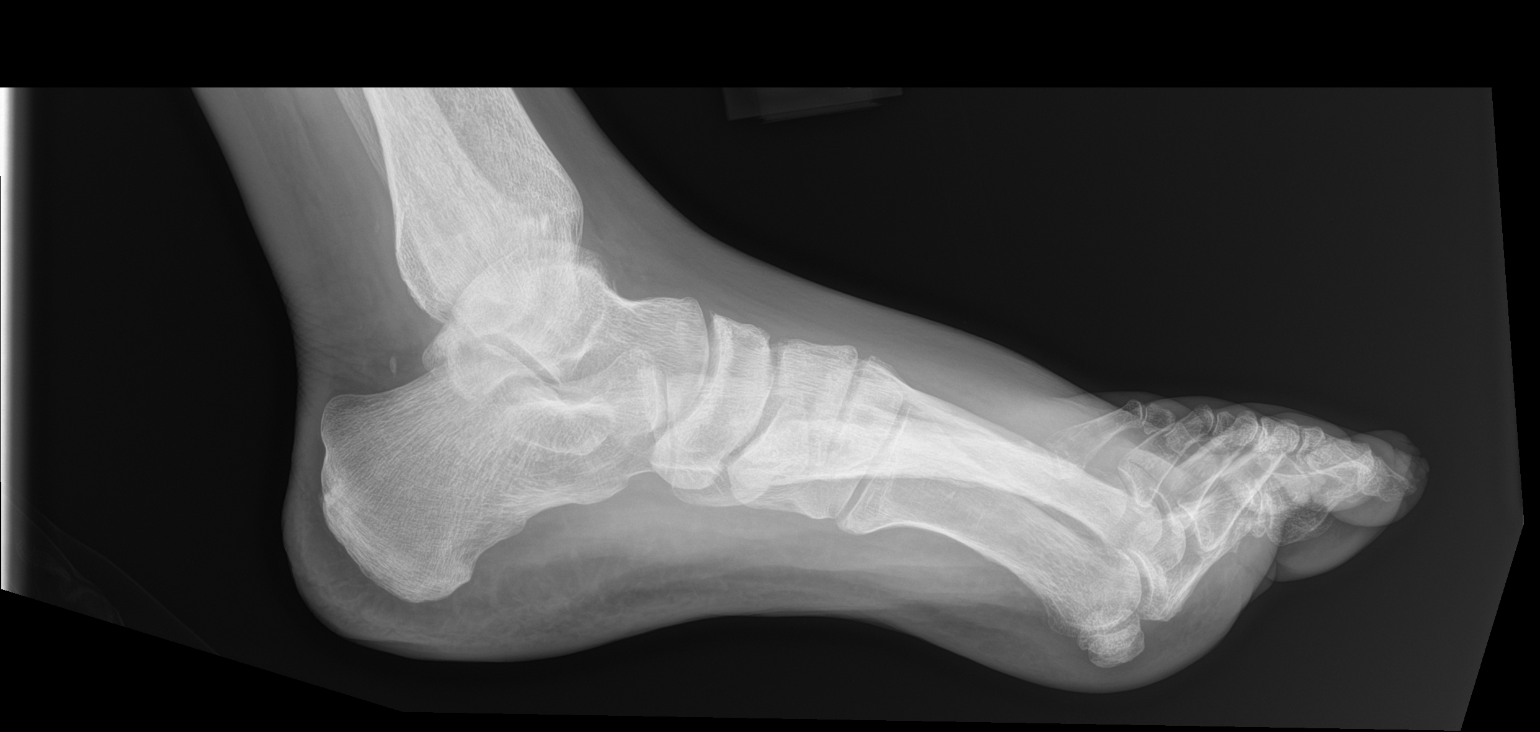

[3 of 3 positions shown; findings below may reference images not displayed]

FINDINGS: There is no evidence of fracture or dislocation. No bony erosion.
There is no evidence of significant arthropathy or other focal bone
abnormality. Mild soft tissue swelling overlying the dorsum of the
forefoot. No soft tissue gas.
IMPRESSION: Mild soft tissue swelling. No acute findings. No evidence of
osteomyelitis.

## 2021-04-28 MED ORDER — TERBINAFINE HCL 1 % EX CREA: 1.0000 "application " | TOPICAL_CREAM | Freq: Two times a day (BID) | CUTANEOUS | 1 refills | Status: AC

## 2021-04-28 MED ORDER — FLUCONAZOLE 150 MG PO TABS: ORAL_TABLET | ORAL | 0 refills | Status: DC

## 2021-04-28 NOTE — ED Provider Notes (Signed)
[name] Note   CSN: [id number] Arrival date & time: 04/28/21  1148     History Chief Complaint  Patient presents with   Wound Infection    John Black is a 87 y.o. male.  HPI      John Black is a 87 y.o. male with past medical history of chronic respiratory failure, COPD, on 4 L of continuous oxygen, prostate cancer and hypothyroidism currently on palliative care.  He presents to the Emergency Department complaining of pain of his right fourth toe and distal foot.  He is followed by podiatry, Dr. Caprice Beaver.  He reports 2-week history of redness to the distal left foot with symptoms beginning at the left fourth toe.  Culture was obtained and grew staph.  Patient was prescribed cephalexin on 04/20/2021 without relief.  He was seen again on 04/24/2021 and doxycycline was added.  On podiatry recheck today he was recommended to come to the emergency department for further evaluation.  Patient spouse states the redness is progressing proximally and that he wears 2 pairs of socks at all times.  Patient states the pain to his toe and foot are improving.  No history of diabetes.  No known injury.  He denies itching of his foot, new medications or creams applied to his foot, fever, chills, nausea or vomiting.  No symptoms proximal to the foot.    Past Medical History:  Diagnosis Date   Allergic rhinitis    Cataract    s/p removal   Chronic respiratory failure (HCC)    oxygen 3L at home   Emphysema    Hypothyroid    PNA (pneumonia)    Prostate cancer [facility])     Patient Active Problem List   Diagnosis Date Noted   Bilateral rotator cuff syndrome 11/07/2020   Syncope and collapse 10/20/2020   Hyperglycemia 10/19/2020   Aortic atherosclerosis ([place]) 10/15/2020   GERD without esophagitis 10/15/2020   Acute blood loss as cause of postoperative anemia 10/15/2020   Vitamin D deficiency 10/15/2020   Closed fracture of right hip ([place]) 10/09/2020    Dysphagia 07/24/2019   Mild protein-calorie malnutrition ([place]) 02/24/2019   Gait instability 01/27/2019   Right inguinal hernia    Chronic diarrhea 12/06/2017   Inguinal hernia 03/02/2014   Constipation 03/02/2014   Chronic respiratory failure with hypoxia ([place]) 12/09/2013   Hemorrhoids 09/28/2013   Hyperlipidemia 06/28/2013   Sinusitis, chronic 11/28/2012   COPD with acute exacerbation ([place]) 11/10/2012   BPH (benign prostatic hyperplasia) 10/22/2012   Insomnia 10/22/2012   HCAP (healthcare-associated pneumonia) 09/05/2012   Hypothyroidism 06/19/2012   Atypical nevi 06/19/2012   Seborrheic keratosis 06/19/2012   History of prostate cancer 11/07/2010   Allergic rhinitis 11/07/2010   COPD GOLD 3/ group D  02 dep 11/07/2010    Past Surgical History:  Procedure Laterality Date   ADENOIDECTOMY     HIP ARTHROPLASTY Right 10/10/2020   Procedure: ARTHROPLASTY BIPOLAR HIP (HEMIARTHROPLASTY);  Surgeon: Mordecai Rasmussen, MD;  Location: [facility];  Service: Orthopedics;  Laterality: Right;   ROTATOR CUFF REPAIR  1990,2009   bilateral   Seed implant for prostate cancer  2000   TONSILLECTOMY     TRANSURETHRAL RESECTION OF PROSTATE  2001   x2       Family History  Problem Relation Age of Onset   Heart disease Mother    Prostate cancer Father     Social History   Tobacco Use   Smoking status: Former  Packs/day: 1.50    Years: 50.00    Pack years: 75.00    Types: Cigarettes    Quit date: 09/17/2008    Years since quitting: 12.6   Smokeless tobacco: Never  Vaping Use   Vaping Use: Never used  Substance Use Topics   Alcohol use: Yes    Alcohol/week: 1.0 standard drink    Types: 1 Glasses of wine per week    Comment: each evening   Drug use: No    Home Medications Prior to Admission medications   Medication Sig Start Date End Date Taking? Authorizing Provider  diphenoxylate-atropine (LOMOTIL) 2.5-0.025 MG tablet TAKE 1 TABLET BY MOUTH 4 (FOUR) TIMES DAILY AS NEEDED FOR  DIARRHEA OR LOOSE STOOLS 04/18/21   Susy Frizzle, MD  acetaminophen (TYLENOL) 650 MG CR tablet Take 650 mg by mouth 3 (three) times daily. 11/07/20   [provider]  albuterol (ACCUNEB) 0.63 MG/3ML nebulizer solution Take 1 ampule by nebulization every 4 (four) hours as needed for wheezing.    [provider]  Albuterol Sulfate (PROAIR RESPICLICK) 123XX123 (90 Base) MCG/ACT AEPB Inhale 2 puffs into the lungs every 4 (four) hours as needed. 11/14/20   [provider]  aspirin EC 81 MG tablet Take 81 mg by mouth. Twice a day from 10/12/2020-11/22/2020 Then take once a day starting 11/23/2020    [provider]  cholecalciferol (VITAMIN D3) 25 MCG (1000 UNIT) tablet Take 1,000 Units by mouth daily.    [provider]  Ensure (ENSURE) Take 237 mLs by mouth daily. 10/19/20   [provider]  Fluticasone-Umeclidin-Vilant (TRELEGY ELLIPTA) 100-62.5-25 MCG/INH AEPB Inhale 1 puff into the lungs daily. 09/30/20   Tanda Rockers, MD  Fluticasone-Umeclidin-Vilant (TRELEGY ELLIPTA) 100-62.5-25 MCG/INH AEPB Inhale 1 puff into the lungs daily. 12/19/20   Tanda Rockers, MD  furosemide (LASIX) 40 MG tablet TAKE 1 TABLET BY MOUTH EVERY DAY 04/24/21   Susy Frizzle, MD  guaiFENesin (MUCINEX) 600 MG 12 hr tablet Take 600 mg by mouth 2 (two) times daily as needed for cough.    [provider]  levothyroxine (SYNTHROID) 75 MCG tablet TAKE 1 TABLET (75 MCG TOTAL) BY MOUTH DAILY. 04/07/20   [place], Modena Nunnery, MD  LORazepam (ATIVAN) 0.5 MG tablet TAKE 1 TABLET BY MOUTH 2 TIMES DAILY AS NEEDED. 02/10/21   Susy Frizzle, MD  mirtazapine (REMERON) 15 MG tablet TAKE 1/2 TABLET BY MOUTH IN THE EVENING 09/21/20   [place], Modena Nunnery, MD  NON FORMULARY Diet: _____ Regular, ___x___ NAS, _______Consistent Carbohydrate, _______NPO _____Other 10/12/20   [provider]  OXYGEN Inhale 4 L into the lungs continuous. 10/12/20   [provider]  pantoprazole  (PROTONIX) 40 MG tablet Take 1 tablet (40 mg total) by mouth 2 (two) times daily. Patient taking differently: Take 40 mg by mouth daily. 10/22/20   Kathie Dike, MD  polyvinyl alcohol (LIQUIFILM TEARS) 1.4 % ophthalmic solution Place 1 drop into both eyes 3 (three) times daily as needed for dry eyes. 10/12/20   [provider]  predniSONE (DELTASONE) 10 MG tablet TAKE 1 TABLET (10 MG TOTAL) BY MOUTH DAILY WITH BREAKFAST 04/25/21   Tanda Rockers, MD  tamsulosin (FLOMAX) 0.4 MG CAPS capsule TAKE 1 CAPSULE BY MOUTH EVERY DAY 08/08/20   Alycia Rossetti, MD    Allergies    Morphine  Review of Systems   Review of Systems  Constitutional:  Negative for chills and fever.  Respiratory:  Negative for  shortness of breath.   Cardiovascular:  Negative for chest pain.  Gastrointestinal:  Negative for abdominal pain, diarrhea, nausea and vomiting.  Musculoskeletal:  Negative for arthralgias (left foot and left fourth toe pain and redness), back pain, myalgias, neck pain and neck stiffness.  Skin:  Positive for color change and rash.  Neurological:  Negative for dizziness, weakness and numbness.  Hematological:  Does not bruise/bleed easily.  Psychiatric/Behavioral:  Negative for confusion.    Physical Exam Updated Vital Signs BP 134/75 (BP Location: Right Arm)   Pulse (!) 112   Temp 98.3 F (36.8 C) (Oral)   Resp 20   Ht '5\' 5"'$  (1.651 m)   Wt 52.2 kg   SpO2 98%   BMI 19.14 kg/m   Physical Exam Vitals and nursing note reviewed.  Constitutional:      General: He is not in acute distress.    Appearance: Normal appearance.  HENT:     Head: Atraumatic.  Cardiovascular:     Rate and Rhythm: Normal rate and regular rhythm.     Pulses: Normal pulses.  Pulmonary:     Effort: Pulmonary effort is normal.  Musculoskeletal:     Right lower leg: No edema.     Left lower leg: No edema.     Comments: Edema of the bilateral dorsal feet.    Skin:    General: Skin is warm.     Capillary  Refill: Capillary refill takes less than 2 seconds.     Findings: Erythema and rash present.     Comments: Erythematous, maculopapular rash to the distal left foot.  Rash extends to third toe and slightly into the webspaces of the third and fourth toes.  No lymphangitis.  No erythema of the distal toes.  See attached photo  Neurological:     General: No focal deficit present.     Mental Status: He is alert.     Sensory: No sensory deficit.     Motor: No weakness.      Patient gave verbal consent for images to be stored in medical record.   ED Results / Procedures / Treatments   Labs (all labs ordered are listed, but only abnormal results are displayed) Labs Reviewed  CBC WITH DIFFERENTIAL/PLATELET - Abnormal; Notable for the following components:      Result Value   Hemoglobin 12.7 (*)    Abs Immature Granulocytes 0.13 (*)    All other components within normal limits  BASIC METABOLIC PANEL - Abnormal; Notable for the following components:   Chloride 93 (*)    CO2 35 (*)    BUN 27 (*)    All other components within normal limits    EKG None  Radiology DG Foot Complete Left  Result Date: 04/28/2021 CLINICAL DATA:  Left foot infection.  Pain. EXAM: LEFT FOOT - COMPLETE 3+ VIEW COMPARISON:  None. FINDINGS: There is no evidence of fracture or dislocation. No bony erosion. There is no evidence of significant arthropathy or other focal bone abnormality. Mild soft tissue swelling overlying the dorsum of the forefoot. No soft tissue gas. IMPRESSION: Mild soft tissue swelling. No acute findings. No evidence of osteomyelitis. Electronically Signed   By: Davina Poke D.O.   On: 04/28/2021 18:59    Procedures Procedures   Medications Ordered in ED Medications - No data to display  ED Course  I have reviewed the triage vital signs and the nursing notes.  Pertinent labs & imaging results that were available during my care  of the patient were reviewed by me and considered in my  medical decision making (see chart for details).   Vitals with BMI 04/28/2021 04/28/2021 04/28/2021  Height - - -  Weight - - -  BMI - - -  Systolic [id number]  Diastolic 74 75 75  Pulse 78 85 112      MDM Rules/Calculators/A&P                            Patient here for evaluation of rash to the distal left foot.  Symptoms have been persistent for 2 weeks.  Patient is followed by podiatry.  He is currently taking doxycycline and cephalexin.  Sent to the emergency department today for further evaluation.  On my exam, patient well-appearing nontoxic.  He has a macular papular rash to the distal left dorsal foot that appears fungal.  Rash does extend into the webspaces of the toes.  He has full range of motion of the toes and left foot.  Neurovascularly intact.  Bilateral peripheral edema at baseline per spouse.  Patient on 4 L of O2 continuously due to history of COPD.  No increased dyspnea or reported chest pain, fevers, chills or open wounds.  No known injury.  Labs interpreted by me, no leukocytosis.  Chemistries without significant derangement.  x-ray of the left foot without evidence of osteomyelitis.    I feel that patient's symptoms are likely related to a tinea pedis.  I have advised patient that he may discontinue his current antibiotic regimen.  Discussed wound care and advised to keep his foot and toes dry.  Will prescribe Diflucan and topical antifungal treatment.  He appears appropriate for discharge home.  Verbalized understanding agrees to plan.  Return precautions were discussed.  Final Clinical Impression(s) / ED Diagnoses Final diagnoses:  Tinea pedis of left foot    Rx / DC Orders ED Discharge Orders     None        Bufford Lope 04/28/21 2217    Noemi Chapel, MD 04/29/21 1212

## 2021-04-28 NOTE — ED Triage Notes (Signed)
Pt with infection to left toe for past 2 weeks.  Pt sent by podiatrist for the infection.  Pt has been on two different antibiotics for it.

## 2021-04-28 NOTE — Discharge Instructions (Addendum)
Your labs and x-ray today were reassuring.  I think your symptoms are related to a fungal infection of the skin to your foot.  You may discontinue the antibiotics that you are currently taking.  Use a hair dryer on a cool setting to dry your feet thoroughly after bathing.  Apply the cream as directed.  Follow up with your primary care provider or your podiatrist.

## 2021-05-02 DIAGNOSIS — B353 Tinea pedis: Secondary | ICD-10-CM | POA: Diagnosis not present

## 2021-05-02 DIAGNOSIS — L03116 Cellulitis of left lower limb: Secondary | ICD-10-CM | POA: Diagnosis not present

## 2021-05-12 ENCOUNTER — Telehealth: Payer: Self-pay

## 2021-05-19 ENCOUNTER — Other Ambulatory Visit: Payer: Self-pay | Admitting: Family Medicine

## 2021-05-19 DIAGNOSIS — L03116 Cellulitis of left lower limb: Secondary | ICD-10-CM | POA: Diagnosis not present

## 2021-05-19 DIAGNOSIS — B353 Tinea pedis: Secondary | ICD-10-CM | POA: Diagnosis not present

## 2021-05-23 ENCOUNTER — Telehealth: Payer: Self-pay | Admitting: Internal Medicine

## 2021-05-23 MED ORDER — TRELEGY ELLIPTA 100-62.5-25 MCG/INH IN AEPB: 1.0000 | INHALATION_SPRAY | Freq: Every day | RESPIRATORY_TRACT | 0 refills | Status: DC

## 2021-05-23 NOTE — Telephone Encounter (Signed)
Patient's wife came into the office asking for samples of Trelegy. She also brought samples of Breo and Incruse with her stating that the patient was given these in the hospital and they weren't sure what to do with them. Provided her with 2 samples of Trelegy as well as patient assistance paperwork due to patient being in the [place]. Advised her I will dispose of the Breo and Incruse. And for her to bring the paperwork back once their portion was done and they got printout from pharmacy. Nothing further needed at this time.

## 2021-05-30 ENCOUNTER — Other Ambulatory Visit: Payer: Self-pay | Admitting: Adult Health

## 2021-05-31 ENCOUNTER — Other Ambulatory Visit: Payer: Self-pay | Admitting: Family Medicine

## 2021-06-08 ENCOUNTER — Other Ambulatory Visit: Payer: Self-pay

## 2021-06-08 ENCOUNTER — Ambulatory Visit: Payer: PPO | Admitting: Internal Medicine

## 2021-06-08 ENCOUNTER — Encounter: Payer: Self-pay | Admitting: Internal Medicine

## 2021-06-08 VITALS — BP 110/58 | Temp 97.7°F | Ht 66.0 in | Wt 122.0 lb

## 2021-06-08 DIAGNOSIS — J449 Chronic obstructive pulmonary disease, unspecified: Secondary | ICD-10-CM

## 2021-06-08 DIAGNOSIS — J9611 Chronic respiratory failure with hypoxia: Secondary | ICD-10-CM | POA: Diagnosis not present

## 2021-06-08 DIAGNOSIS — Z23 Encounter for immunization: Secondary | ICD-10-CM | POA: Diagnosis not present

## 2021-06-08 NOTE — Patient Instructions (Addendum)
Make sure you check your oxygen saturation  at your highest level of activity  to be sure it stays over 90% and adjust  02 flow upward to maintain this level if needed but remember to turn it back to previous settings when you stop (to conserve your supply).  Ok to try albuterol 15 min before an activity (on alternating days)  that you know would usually make you short of breath and see if it makes any difference and if makes none then don't take albuterol after activity unless you can't catch your breath as this means it's the resting that helps, not the albuterol.  Think of albuterol like starter fluid       If swelling in legs is not well controlled I would prefer aldactone be added 25 mg twice daily but not needed now and must be monitored carefully by same prescriber as lasix    Please schedule a follow up visit in 6 months but call sooner if needed

## 2021-06-08 NOTE — Progress Notes (Signed)
John Black, male    DOB: [birth year]     [name]: [id number]   Brief patient profile:  89 yowm quit smoking in 2010 then major aspiration event? MRSA then dx with GOLD II copd as of 2015 prev followed by Dr Gwenette Greet s/p admit:  Admit date: 02/16/2020 Discharge date: 02/19/2020  Discharge Diagnoses:  Principal Problem:   COPD with acute exacerbation ([place])   Hypothyroidism   BPH (benign prostatic hyperplasia)   Acute on chronic respiratory failure with hypoxia (HCC)   Sepsis (HCC)   SOB (shortness of breath)  History of present illness:  87 y.o. male with medical history significant of COPD/emphysema, chronic respiratory failure on home oxygen, hypothyroidism presenting with complaints of shortness of breath.  Patient reports 3 to 4-day history of progressively worsening dyspnea.  He is also wheezing but not coughing much.  Denies chest pain.  Denies fevers or chills.  States he uses 3 to 4 L supplemental oxygen at home all the time.   He was admitted with fever and shortness of breath.  He was found to have a COPD exacerbation.  He's improved with steroids, abx, and scheduled and prn nebs.  6/4 appeared improved and was discharged with plan for outpatient follow up.   Hospital Course:  Acute on chronic hypoxic respiratory failure, suspect secondary to acute COPD exacerbation:  CT PE protocol without PE, no acute intrathoracic pathology, advanced emphysema Discharge with steroid taper, complete course of azithromycin, resume home nebs Ceftriaxone x3 days.  Complete 5 day total course of azithromycin.  Discharged with 2 days azithromycin.  Home theophylline Stable on home oxygen, maintained O2 sats with 4 L  Will need home O2 screen prior to d/c - maintained O2 sats 90% on home 4 L today Negative COVID testing, follow RVP negative   Sinus Tachycardia: unclear etiology? Possibly 2/2 nebs.  HR up to 120-130 with ambulation with therapy. - on review of previous outpatient notes, he was  tachycardic to 120 on 01/22/20 visit and to 108 at 09/23/19 visit.  Possible chronic component.  Daughter notes his HR usually in 80's-90's on pulse ox at home.  EKG and tele show sinus tach.  Normal TSH from admission.  Echo with grade II diastolic dysfunction, normal EF (see report) -> follow outpatient.   Mild to moderate Dilatation of the ascending aorta: 39 cm on echo - follow outpatient for surveillance    Febrile Illness:  With COPD exacerbation, respiratory distress, suspect he had  viral illness leading to exacerbation.     GERD -Continue PPI   BPH -Continue Flomax   Anemia: suspect component of dilution, repeat H/H   Procedures: Echo IMPRESSIONS     1. Left ventricular ejection fraction, by estimation, is 60 to 65%. The  left ventricle has normal function. The left ventricle has no regional  wall motion abnormalities. Left ventricular diastolic parameters are  consistent with Grade II diastolic  dysfunction (pseudonormalization).   2. Right ventricular systolic function is normal. The right ventricular  size is normal.   3. The mitral valve is normal in structure. Mild mitral valve  regurgitation. No evidence of mitral stenosis.   4. The aortic valve is normal in structure. Aortic valve regurgitation is  not visualized. No aortic stenosis is present.   5. Aortic dilatation noted. There is mild to moderate dilatation of the  ascending aorta measuring 39 mm.         History of Present Illness  04/14/2020  Pulmonary/ 1st  office eval/ Kharma Sampsel / [place] [facility] / trelegy / prednisone 10  X years  Chief Complaint  Patient presents with   Consult    shortness of breath with exertion  Dyspnea:  MMRC3 = can't walk 100 yards even at a slow pace at a flat grade s stopping due to sob - uses w/c a lot Cough: hoarse, some choking with swallowing  Sleep: bed is flat couple of pillows SABA use: neb in am 02 4 lpm hs and 3-4 lpm with sitting/ walking  rec Stop uniphyl  Continue  protonix Take 30-60 min before first meal of the day  GERD  Diet  If breathing gets worse double the prednisone until better then back to 10 mg daily  Make sure you check your oxygen saturations at highest level of activity to be sure it stays over 90% and adjust upward to maintain this level if needed but remember to turn it back to previous settings when you stop (to conserve your supply).  Please schedule a follow up office visit in 6 weeks, call sooner if needed - bring inhalers         05/26/2020  f/u ov/[place] office/Jahniyah Revere re:  GOLD 3/ 02 dep on trelegy / preferred symbicort/ maint pred at 10 mg daily  Chief Complaint  Patient presents with   Follow-up    shortness of breath with activity  Dyspnea: weak and sob limited to  room to room at home with cane   Cough: none  Sleeping: bed is flat/ 2 pillows SABA use: nebulizer is being using as backup frequently  02: up to 5lpm  rec Plan A = Automatic = Always=    Breztri Take 2 puffs first thing in am and then another 2 puffs about 12 hours later.  Work on inhaler technique:   Plan B = Backup (to supplement plan A, not to replace it) Only use your albuterol inhaler as a rescue medication   Plan C = Crisis (instead of Plan B but only if Plan B stops working) - only use your albuterol nebulizer if you first try Plan B and it fails to help > ok to use the nebulizer up to every 4 hours but if start needing it regularly call for immediate appointment Plan D = deltasone if losing ground and needing B and  C - double prednisone until better then 1 daily      06/21/2020  f/u ov/[place] office/Trevone Prestwood re:  GOLD III / 02 dep on breztri/ pred 10 mg Chief Complaint  Patient presents with   Acute Visit  Dyspnea:  Room to room  Cough: more copious/ white / not using max mucinex/ no using flutter as rec  Sleeping: flat bed two pillows SABA use: not using hfa/ neb not understanding when to use  Vs prednisone 20 as plan D above  02: 3-5 lpm  titrates  rec Plan A = Automatic = Always=    Trelegy one click each am    Plan B = Backup (to supplement plan A, not to replace it) Only use your albuterol inhaler as a rescue medication Plan C = Crisis (instead of Plan B but only if Plan B stops working) - only use your albuterol nebulizer if you first try Plan B  Plan D = deltasone if losing ground and needing B and  C - double prednisone until better then 1 daily  For cough/ congestion > mucinex up to 1200 mg  twice and use the flutter as much  as possible        08/29/2020  f/u ov/[place] office/Douglass Duns[place] re:  COPD with GOLD III   spirometry / 02 dep @  24/7 and prednisone 10 mg daily  X years  Chief Complaint  Patient presents with   Follow-up    Chest congestion in past week  Dyspnea:   Room to room at home on 4lpm  Cough: none Sleeping: flat bed with 2 pillows  SABA use: very little  02: 4lpm 24/7  rec Plan A = Automatic = Always=    Trelegy one click each am   Plan B = Backup (to supplement plan A, not to replace it) Only use your albuterol inhaler as a rescue medication Plan C = Crisis (instead of Plan B but only if Plan B stops working) - only use your albuterol nebulizer if you first try Plan B and it fails to help > ok to use the nebulizer up to every 4 hours but if start needing it regularly call for immediate appointment Plan D = deltasone if losing ground and needing B and  C - double prednisone until better then 1 daily  For cough/ congestion > mucinex up to 1200 mg twice and use the flutter as much as possible     11/22/20 PC Spoke with the pt's daughter  She states that the pt broke his hip and had replacement Jan 2022 Martin [facility] to [place] d/c 11/21/20  b/c he was not progressing and is now wheelchair bound She states before he was d/c  the nurse noticed abnormal breathing and cxr ordered  His cxr showed double pna and he was started on Avelox  11/18/18  She states he is not improving, having a lot of SOB sometimes even  at rest he "pants"  His sats are around 92% on 4lpm cont o2 and drop as low as 70% 4lpm with min exertion  She states he is also wheezing but no cough at this time  No f/c/s Fully vaccinated against covid   11/22/2020  Acute  ov/ office/Traniya Prichett re: copd gold III but 02 and  pred dep at 10 mg daily  - increased to 2 daily as of am 3/8  Chief Complaint  Patient presents with   Acute Visit    Dx with PNA 11/17/20- SOB and wheezing. Sats have been dropping into 70's on 4lpm o2 with exertion.   Dyspnea:  2 person assist to get oob and into w/c then to recliner  Cough: none  Sleeping: flat / two pillows  SABA use: none on day of ov or prior noct despite reports of "struggling to breath 02: concentrator  4lpm and inogen POC up to 5lpm pulsed / poor insight into titration principles / not using mucinex or flutter valve  rec Plan A =  Automatic = Always=   Trelegy one click each am   Plan B = Backup (to supplement plan A, not to replace it) Only use your albuterol inhaler as a rescue medication Plan C = Crisis (instead of Plan B but only if Plan B stops working) - only use your albuterol nebulizer if you first try Plan B and it fails to help > ok to use the nebulizer up to every 4 hours but if start needing it regularly call for immediate appointment Plan D = deltasone if losing ground and needing B and  C - double prednisone until better then 1 daily  For cough/ congestion > mucinex up to 1200 mg every  12 hours and use the flutter as much as possible  Adjust 02 to keep saturations above 90%  - if can't get comfortable at rest or can't keep 02 above 90% on max flow at rest then will need to go back to ER    12/19/2020  f/u ov/[place] office/Francile Woolford re: GOLD III/ pred/ 02 dep  maint on trelegy  Chief Complaint  Patient presents with   Follow-up    Breathing is "probably a little better"   Dyspnea:  Limited by feet/legs weak / mostly staying in w/c but doing his own transfers now  Cough: no   Sleeping: flat bed/ 2 pillows  SABA use: rarely needed  02: 4lpm 24/7  Covid status: x3  Rec If doing great, one option would be  To alternate 10 with 5 mg daily   Make sure you check your oxygen saturation  at your highest level of activity  to be sure it stays over 90% and adjust  02 flow upward to maintain this level if needed but remember to turn it back to previous settings when you stop (to conserve your supply).  No other recommendations  Please schedule a follow up visit in 3 months but call sooner if needed    06/08/2021  f/u ov/[place] office/Kalenna Millett re: GOLD 3 but 02 dep, maint on trelegy  and prednisone @ 10 mg daily   Chief Complaint  Patient presents with   Follow-up    4L O2 all of the time while sleeping and during the day. 5L O2 prn. Uses nebulizer every morning. Wife states sometimes he seems SOB but O2 levels reading high.    Dyspnea:  walking across the room on his own with PT on 4lpm  Cough: very little  Sleeping: flat bed / 2 pillows  SABA use: rarely  02: 4- 5lpm  Covid status: x 3 vax      No obvious day to day or daytime variability or assoc excess/ purulent sputum or mucus plugs or hemoptysis or cp or chest tightness, subjective wheeze or overt sinus or hb symptoms.   Sleeping  without nocturnal  or early am exacerbation  of respiratory  c/o's or need for noct saba. Also denies any obvious fluctuation of symptoms with weather or environmental changes or other aggravating or alleviating factors except as outlined above   No unusual exposure hx or h/o childhood pna/ asthma or knowledge of premature birth.  Current Allergies, Complete Past Medical History, Past Surgical History, Family History, and Social History were reviewed in [employer] record.  ROS  The following are not active complaints unless bolded Hoarseness, sore throat, dysphagia, dental problems, itching, sneezing,  nasal congestion or discharge of excess mucus or purulent  secretions, ear ache,   fever, chills, sweats, unintended wt loss or wt gain, classically pleuritic or exertional cp,  orthopnea pnd or arm/hand swelling  or leg swelling, presyncope, palpitations, abdominal pain, anorexia, nausea, vomiting, diarrhea  or change in bowel habits or change in bladder habits, change in stools or change in urine, dysuria, hematuria,  rash, arthralgias, visual complaints, headache, numbness, weakness or ataxia or problems with walking or coordination,  change in mood or  memory.        Current Meds  Medication Sig   acetaminophen (TYLENOL) 500 MG tablet Take 500 mg by mouth every 6 (six) hours as needed.   acetaminophen (TYLENOL) 650 MG CR tablet Take 650 mg by mouth 3 (three) times daily.   albuterol (ACCUNEB)  0.63 MG/3ML nebulizer solution Take 1 ampule by nebulization every 4 (four) hours as needed for wheezing.   albuterol (PROVENTIL) (2.5 MG/3ML) 0.083% nebulizer solution UE 1 VIAL IN NEBULIZER EVERY 6 HOURS AS NEEDED FOR WHEEZING/SOB   Albuterol Sulfate (PROAIR RESPICLICK) 128 (90 Base) MCG/ACT AEPB Inhale 2 puffs into the lungs every 4 (four) hours as needed.   aspirin EC 81 MG tablet Take 81 mg by mouth. Twice a day from 10/12/2020-11/22/2020 Then take once a day starting 11/23/2020   cholecalciferol (VITAMIN D3) 25 MCG (1000 UNIT) tablet Take 1,000 Units by mouth daily.   diphenhydrAMINE HCl, Sleep, (SLEEPING PO) Take 2 tablets by mouth at bedtime.   diphenoxylate-atropine (LOMOTIL) 2.5-0.025 MG tablet TAKE 1 TABLET BY MOUTH 4 (FOUR) TIMES DAILY AS NEEDED FOR DIARRHEA OR LOOSE STOOLS   Ensure (ENSURE) Take 237 mLs by mouth daily.   fluconazole (DIFLUCAN) 150 MG tablet 1 tablet by mouth every 3 days for 3 doses.   Fluticasone-Umeclidin-Vilant (TRELEGY ELLIPTA) 100-62.5-25 MCG/INH AEPB Inhale 1 puff into the lungs daily.   Fluticasone-Umeclidin-Vilant (TRELEGY ELLIPTA) 100-62.5-25 MCG/INH AEPB Inhale 1 puff into the lungs daily.   furosemide (LASIX) 40 MG tablet TAKE 1  TABLET BY MOUTH EVERY DAY   guaiFENesin (MUCINEX) 600 MG 12 hr tablet Take 600 mg by mouth 2 (two) times daily as needed for cough.   levothyroxine (SYNTHROID) 75 MCG tablet TAKE 1 TABLET (75 MCG TOTAL) BY MOUTH DAILY.   mirtazapine (REMERON) 15 MG tablet TAKE 1/2 TABLET BY MOUTH IN THE EVENING   NON FORMULARY Diet: _____ Regular, ___x___ NAS, _______Consistent Carbohydrate, _______NPO _____Other   OVER THE COUNTER MEDICATION Take 1 tablet by mouth 2 (two) times daily. Slippery elm   OXYGEN Inhale 4 L into the lungs continuous.   polyvinyl alcohol (LIQUIFILM TEARS) 1.4 % ophthalmic solution Place 1 drop into both eyes 3 (three) times daily as needed for dry eyes.   predniSONE (DELTASONE) 10 MG tablet TAKE 1 TABLET (10 MG TOTAL) BY MOUTH DAILY WITH BREAKFAST   tamsulosin (FLOMAX) 0.4 MG CAPS capsule TAKE 1 CAPSULE BY MOUTH EVERY DAY   terbinafine (LAMISIL) 1 % cream Apply 1 application topically 2 (two) times daily.                        Past Medical History:  Diagnosis Date   Allergic rhinitis    Cataract    s/p removal   Chronic respiratory failure (HCC)    oxygen 3L at home   Emphysema    Hypothyroid    PNA (pneumonia)    Prostate cancer (HCC)        Objective:    06/08/2021       122  12/19/2020         125 11/22/2020         122 08/29/2020     124   06/21/2020       115  05/26/20 112 lb 3.2 oz (50.9 kg)  04/14/20 112 lb 12.8 oz (51.2 kg)    Vital signs reviewed  06/08/2021  - Note at rest 02 sats  95% on 4lpm    General appearance:    elderly wm w/c bound  nad  HEENT : pt wearing mask not removed for exam due to covid -19 concerns.    NECK :  without JVD/Nodes/TM/ nl carotid upstrokes bilaterally   LUNGS: no acc muscle use,  Mod barrel  contour chest wall with bilateral  Distant bs  s audible wheeze and  without cough on insp or exp maneuvers and mod  Hyperresonant  to  percussion bilaterally     CV:  RRR  no s3 or murmur or increase in P2, and  L>R pitting  LE edema   ABD:  soft and nontender with pos mid insp Hoover's  in the supine position. No bruits or organomegaly appreciated, bowel sounds nl  MS:     ext warm without deformities, calf tenderness, cyanosis or clubbing No obvious joint restrictions   SKIN: warm and dry without lesions    NEURO:  alert, approp, nl sensorium with  no motor or cerebellar deficits apparent though remained in w/c for exam                Assessment

## 2021-06-09 ENCOUNTER — Encounter: Payer: Self-pay | Admitting: Internal Medicine

## 2021-06-09 NOTE — Assessment & Plan Note (Signed)
Quit smoking  2010 - daily pred since ? 2010?  - PFT's 12/17/13  FEV1 1.17 (57 % ) ratio 0.36  p 6 % improvement from saba p ? prior to study with DLCO  11.56 (47%) corrects to 2.19 (52%)  for alv volume and FV curve classic exp concavity on exp loop and air trapping - d/c theph 04/14/2020 and max rx for gerd  - 05/26/2020   try change trelegy to breztri as prev preferred symbicort - 06/21/2020 changed back to trelegy at pt request -  11/22/2020   Using proair respiclick as plan B and neb saba as plan C with prednisone 10 mg floor and 20 mg / day ceiling    Group D in terms of symptom/risk and laba/lama/ICS  therefore appropriate rx at this point >>>  trelelgy plus pred 10 mg floor, 20 mg ceiling and approp saba  I spent extra time with pt today reviewing appropriate use of albuterol for prn use on exertion with the following points: 1) saba is for relief of sob that does not improve by walking a slower pace or resting but rather if the pt does not improve after trying this first. 2) If the pt is convinced, as many are, that saba helps recover from activity faster then it's easy to tell if this is the case by re-challenging : ie stop, take the inhaler, then p 5 minutes try the exact same activity (intensity of workload) that just caused the symptoms and see if they are substantially diminished or not after saba 3) if there is an activity that reproducibly causes the symptoms, try the saba 15 min before the activity on alternate days   If in fact the saba really does help, then fine to continue to use it prn but advised may need to look closer at the maintenance regimen being used to achieve better control of airways disease with exertion.

## 2021-06-09 NOTE — Assessment & Plan Note (Signed)
04/14/2020   Walked RA  approx   300 ft  @ slow pace  stopped due to  End of study with sats  dropping to 88% at end  - 11/07/20  HC03  = 31  - 11/22/2020 rec change 02 to 4-5 lpm no pulsing   Advised: Make sure you check your oxygen saturation  at your highest level of activity  to be sure it stays over 90% and adjust  02 flow upward to maintain this level if needed but remember to turn it back to previous settings when you stop (to conserve your supply).          Each maintenance medication was reviewed in detail including emphasizing most importantly the difference between maintenance and prns and under what circumstances the prns are to be triggered using an action plan format where appropriate.  Total time for H and P, chart review, counseling, reviewing dpi, hfa/ neb/02  device(s) and generating customized AVS unique to this office visit / same day charting = 25 min

## 2021-06-20 DIAGNOSIS — L89522 Pressure ulcer of left ankle, stage 2: Secondary | ICD-10-CM | POA: Diagnosis not present

## 2021-06-22 DIAGNOSIS — E039 Hypothyroidism, unspecified: Secondary | ICD-10-CM | POA: Diagnosis not present

## 2021-06-22 DIAGNOSIS — L89520 Pressure ulcer of left ankle, unstageable: Secondary | ICD-10-CM | POA: Diagnosis not present

## 2021-06-22 DIAGNOSIS — Z9981 Dependence on supplemental oxygen: Secondary | ICD-10-CM | POA: Diagnosis not present

## 2021-06-22 DIAGNOSIS — B0229 Other postherpetic nervous system involvement: Secondary | ICD-10-CM | POA: Diagnosis not present

## 2021-06-22 DIAGNOSIS — R338 Other retention of urine: Secondary | ICD-10-CM | POA: Diagnosis not present

## 2021-06-22 DIAGNOSIS — K649 Unspecified hemorrhoids: Secondary | ICD-10-CM | POA: Diagnosis not present

## 2021-06-22 DIAGNOSIS — G4700 Insomnia, unspecified: Secondary | ICD-10-CM | POA: Diagnosis not present

## 2021-06-22 DIAGNOSIS — R1310 Dysphagia, unspecified: Secondary | ICD-10-CM | POA: Diagnosis not present

## 2021-06-22 DIAGNOSIS — Z7952 Long term (current) use of systemic steroids: Secondary | ICD-10-CM | POA: Diagnosis not present

## 2021-06-22 DIAGNOSIS — E441 Mild protein-calorie malnutrition: Secondary | ICD-10-CM | POA: Diagnosis not present

## 2021-06-22 DIAGNOSIS — J9611 Chronic respiratory failure with hypoxia: Secondary | ICD-10-CM | POA: Diagnosis not present

## 2021-06-22 DIAGNOSIS — Z87891 Personal history of nicotine dependence: Secondary | ICD-10-CM | POA: Diagnosis not present

## 2021-06-22 DIAGNOSIS — K219 Gastro-esophageal reflux disease without esophagitis: Secondary | ICD-10-CM | POA: Diagnosis not present

## 2021-06-22 DIAGNOSIS — E559 Vitamin D deficiency, unspecified: Secondary | ICD-10-CM | POA: Diagnosis not present

## 2021-06-22 DIAGNOSIS — Z7982 Long term (current) use of aspirin: Secondary | ICD-10-CM | POA: Diagnosis not present

## 2021-06-22 DIAGNOSIS — D649 Anemia, unspecified: Secondary | ICD-10-CM | POA: Diagnosis not present

## 2021-06-22 DIAGNOSIS — I700 Atherosclerosis of aorta: Secondary | ICD-10-CM | POA: Diagnosis not present

## 2021-06-22 DIAGNOSIS — J449 Chronic obstructive pulmonary disease, unspecified: Secondary | ICD-10-CM | POA: Diagnosis not present

## 2021-06-22 DIAGNOSIS — N401 Enlarged prostate with lower urinary tract symptoms: Secondary | ICD-10-CM | POA: Diagnosis not present

## 2021-06-22 DIAGNOSIS — Z8673 Personal history of transient ischemic attack (TIA), and cerebral infarction without residual deficits: Secondary | ICD-10-CM | POA: Diagnosis not present

## 2021-06-22 DIAGNOSIS — K529 Noninfective gastroenteritis and colitis, unspecified: Secondary | ICD-10-CM | POA: Diagnosis not present

## 2021-06-22 DIAGNOSIS — E782 Mixed hyperlipidemia: Secondary | ICD-10-CM | POA: Diagnosis not present

## 2021-06-22 DIAGNOSIS — E871 Hypo-osmolality and hyponatremia: Secondary | ICD-10-CM | POA: Diagnosis not present

## 2021-06-27 ENCOUNTER — Telehealth: Payer: Self-pay | Admitting: Pharmacist

## 2021-06-27 DIAGNOSIS — L89522 Pressure ulcer of left ankle, stage 2: Secondary | ICD-10-CM | POA: Diagnosis not present

## 2021-06-27 NOTE — Progress Notes (Signed)
Chronic Care Management Pharmacy Assistant   Name: John Black  [name]: [id number] DOB: 24-Aug-1929  Reason for Encounter: General Disease State Call   Conditions to be addressed/monitored: allergic rhinitis, COPD, chronic diarrhea, hypothyroidism, BPH, mild hyperlipidemia.  Recent office visits:  None since 02/17/21   Recent consult visits:  06/08/21 Pulmonary Disease Tanda Rockers, MD. For follow-up. No medication changes. 04/27/21 Gerontology For follow-up. Josiah Lobo 04/20/21 Gerontology For follow-up. Josiah Lobo 04/07/21 Gerontology For follow-up. Josiah Lobo 03/28/21 Gerontology For follow-up. Josiah Lobo 03/21/21 Gerontology For follow-up. Josiah Lobo 03/08/21 Gerontology For follow-up. Cathi Roan [facility] visits:  04/28/21 [facility] ( 3 Hours) Noemi Chapel, MD. For wound infection on left foot. STARTED Fluconazole 150 mg 1 tablet by mouth every 3 days for 3 doses, Terbinafine HCL 1% application topical 2 times daily. STOPPED Cephalexin and Doxycycline.   Medications: Outpatient Encounter Medications as of 06/27/2021  Medication Sig   acetaminophen (TYLENOL) 500 MG tablet Take 500 mg by mouth every 6 (six) hours as needed.   acetaminophen (TYLENOL) 650 MG CR tablet Take 650 mg by mouth 3 (three) times daily.   albuterol (ACCUNEB) 0.63 MG/3ML nebulizer solution Take 1 ampule by nebulization every 4 (four) hours as needed for wheezing.   albuterol (PROVENTIL) (2.5 MG/3ML) 0.083% nebulizer solution UE 1 VIAL IN NEBULIZER EVERY 6 HOURS AS NEEDED FOR WHEEZING/SOB   Albuterol Sulfate (PROAIR RESPICLICK) 376 (90 Base) MCG/ACT AEPB Inhale 2 puffs into the lungs every 4 (four) hours as needed.   aspirin EC 81 MG tablet Take 81 mg by mouth. Twice a day from 10/12/2020-11/22/2020 Then take once a day starting 11/23/2020   cholecalciferol (VITAMIN D3) 25 MCG (1000 UNIT) tablet Take 1,000 Units by  mouth daily.   diphenhydrAMINE HCl, Sleep, (SLEEPING PO) Take 2 tablets by mouth at bedtime.   diphenoxylate-atropine (LOMOTIL) 2.5-0.025 MG tablet TAKE 1 TABLET BY MOUTH 4 (FOUR) TIMES DAILY AS NEEDED FOR DIARRHEA OR LOOSE STOOLS   Ensure (ENSURE) Take 237 mLs by mouth daily.   fluconazole (DIFLUCAN) 150 MG tablet 1 tablet by mouth every 3 days for 3 doses.   Fluticasone-Umeclidin-Vilant (TRELEGY ELLIPTA) 100-62.5-25 MCG/INH AEPB Inhale 1 puff into the lungs daily.   Fluticasone-Umeclidin-Vilant (TRELEGY ELLIPTA) 100-62.5-25 MCG/INH AEPB Inhale 1 puff into the lungs daily.   furosemide (LASIX) 40 MG tablet TAKE 1 TABLET BY MOUTH EVERY DAY   guaiFENesin (MUCINEX) 600 MG 12 hr tablet Take 600 mg by mouth 2 (two) times daily as needed for cough.   levothyroxine (SYNTHROID) 75 MCG tablet TAKE 1 TABLET (75 MCG TOTAL) BY MOUTH DAILY.   mirtazapine (REMERON) 15 MG tablet TAKE 1/2 TABLET BY MOUTH IN THE EVENING   NON FORMULARY Diet: _____ Regular, ___x___ NAS, _______Consistent Carbohydrate, _______NPO _____Other   OVER THE COUNTER MEDICATION Take 1 tablet by mouth 2 (two) times daily. Slippery elm   OXYGEN Inhale 4 L into the lungs continuous.   polyvinyl alcohol (LIQUIFILM TEARS) 1.4 % ophthalmic solution Place 1 drop into both eyes 3 (three) times daily as needed for dry eyes.   predniSONE (DELTASONE) 10 MG tablet TAKE 1 TABLET (10 MG TOTAL) BY MOUTH DAILY WITH BREAKFAST   tamsulosin (FLOMAX) 0.4 MG CAPS capsule TAKE 1 CAPSULE BY MOUTH EVERY DAY   terbinafine (LAMISIL) 1 % cream Apply 1 application topically 2 (two) times daily.   No facility-administered encounter medications on file as of 06/27/2021.   GEN CALL: Patients wife stated  they are going back to the wound doctor today to help heal his wound infection. The nurse was there and she stated his lungs are clear and his heart rate is regular and he has active bowl sounds. Patients wife stated that the nurse stated his right ankle/foot is  normal and is a 17 circumference. The left foot is swollen and has a 18 circumference. The patients wife stated he has a hard time chewing some foods and chokes sometimes. She stated she does most of everything for him but he does do a lot of pivoting. Patients wife stated she gives him enough fluids daily. She state she doesn't have any questions about his medication at this time.   06/27/21-132/60 Oxy 99. HR 57 R20   Care Gaps:Not on the list.   Star Rating Drugs:N/A.  Follow-Up:Pharmacist Review  Charlann Lange, [place] Pharmacist Assistant [phone]

## 2021-06-29 ENCOUNTER — Telehealth: Payer: Self-pay | Admitting: *Deleted

## 2021-06-29 NOTE — Telephone Encounter (Signed)
Received call from patient wife, Jaci Standard.   Reports that patient is very agitated lately and is requesting low dose anxiety medication to help calm him down.   Please advise.

## 2021-06-30 ENCOUNTER — Other Ambulatory Visit: Payer: Self-pay | Admitting: *Deleted

## 2021-06-30 ENCOUNTER — Other Ambulatory Visit: Payer: Self-pay | Admitting: Family Medicine

## 2021-06-30 MED ORDER — LORAZEPAM 0.5 MG PO TABS: 0.5000 mg | ORAL_TABLET | Freq: Two times a day (BID) | ORAL | 0 refills | Status: DC | PRN

## 2021-06-30 NOTE — Telephone Encounter (Signed)
Call placed to patient and patient made aware.  

## 2021-06-30 NOTE — Telephone Encounter (Signed)
Call placed to patient wife Jaci Standard to inquire.   Reports that [facility] SN from [facility] is at home today for wound care to ankle.   Alyse Low states that patient is currently sating at 92% on 3L/min via [place], wheezing noted in all lung fields, and increased confusion. Requested order for mobile CXR to be sent to [facility]   Order faxed.

## 2021-06-30 NOTE — Telephone Encounter (Signed)
Received return call from patient wife, Jaci Standard.   Reports that she has noted that patient is more confused and agitated since starting the Remeron.   Inquired if this could be causing his symptoms.   Please advise.

## 2021-07-02 DIAGNOSIS — R0602 Shortness of breath: Secondary | ICD-10-CM | POA: Diagnosis not present

## 2021-07-03 ENCOUNTER — Telehealth: Payer: Self-pay | Admitting: Family Medicine

## 2021-07-03 MED ORDER — LEVOFLOXACIN 500 MG PO TABS: 500.0000 mg | ORAL_TABLET | Freq: Every day | ORAL | 0 refills | Status: AC

## 2021-07-03 NOTE — Telephone Encounter (Signed)
CXR report received and PCP reviewed.   New orders obtained for Levaquin 500mg  PO QD X7 days and F/U on Friday.   Call placed to patient and patient wife made aware. Prescription sent to pharmacy.

## 2021-07-03 NOTE — Telephone Encounter (Signed)
Received call from [facility] with [place]. Steffanie Dunn has received results of chest xray ordered 10/14; making sure provider has also received a copy. Results require follow up. Please advise at [phone].

## 2021-07-03 NOTE — Telephone Encounter (Signed)
Call placed to [place], [facility] with [place].   Advised that X-ray report has not been received at this time. States that she will have office fax immediately.   Reports that lung sounds have improved and are WNL in all lobes except R middle lobe.   Also reports that patient is less agitated without the Remeron.

## 2021-07-06 ENCOUNTER — Ambulatory Visit (INDEPENDENT_AMBULATORY_CARE_PROVIDER_SITE_OTHER): Payer: PPO | Admitting: Family Medicine

## 2021-07-06 ENCOUNTER — Other Ambulatory Visit: Payer: Self-pay

## 2021-07-06 ENCOUNTER — Encounter: Payer: Self-pay | Admitting: Family Medicine

## 2021-07-06 VITALS — BP 110/62 | HR 90 | Temp 98.9°F | Resp 22 | Ht 66.0 in | Wt 121.4 lb

## 2021-07-06 DIAGNOSIS — R451 Restlessness and agitation: Secondary | ICD-10-CM

## 2021-07-06 NOTE — Progress Notes (Signed)
Subjective:    Patient ID: John Black, male    DOB: 1929/04/10, 87 y.o.   [name]: [id number]  Patient is a very pleasant 87 year old Caucasian gentleman with a history of chronic hypoxemic respiratory failure on 3 L of oxygen.  He has COPD/emphysema.  Recently his family called stating that he was more agitated requesting something for agitation.  I had him stop his Remeron at night.  We did provide Ativan as needed for agitation but also wanted to rule out infections.  Therefore I recommended a chest x-ray given his underlying pulmonary issues.  X-ray shows focal infiltrate in the right upper lobe, malignancy cannot be completely excluded.  It also showed findings consistent with COPD.  Therefore we started the patient on Levaquin 500 mg daily for 7 days and I asked him to come in for evaluation.  Of note, on a CT scan of his chest in February, there was scar tissue seen in the right upper lobe coinciding with the possible infiltrate seen on the chest x-ray:  Lungs/Pleura: Severe emphysema. Right upper lobe scarring and architectural distortion. There is a 6 mm left lower lobe nodule (77/6). No consolidative changes. There is no pleural effusion pneumothorax. Bilateral calcified pleural plaques. The central airways are patent.  He does have a cough productive of yellow mucus however this is pretty much his baseline.  He denies any shortness of breath.  He denies any chest pain.  He states that he is at his baseline respiratory status.  His wife states that the agitation improved after we stopped the Remeron.  This began to improve even before we gave him the antibiotics. Past Medical History:  Diagnosis Date   Allergic rhinitis    Cataract    s/p removal   Chronic respiratory failure (HCC)    oxygen 3L at home   Emphysema    Hypothyroid    PNA (pneumonia)    Prostate cancer [facility])    Past Surgical History:  Procedure Laterality Date   ADENOIDECTOMY     HIP ARTHROPLASTY Right  10/10/2020   Procedure: ARTHROPLASTY BIPOLAR HIP (HEMIARTHROPLASTY);  Surgeon: Mordecai Rasmussen, MD;  Location: [facility];  Service: Orthopedics;  Laterality: Right;   ROTATOR CUFF REPAIR  1990,2009   bilateral   Seed implant for prostate cancer  2000   TONSILLECTOMY     TRANSURETHRAL RESECTION OF PROSTATE  2001   x2   Current Outpatient Medications on File Prior to Visit  Medication Sig Dispense Refill   acetaminophen (TYLENOL) 500 MG tablet Take 500 mg by mouth every 6 (six) hours as needed.     acetaminophen (TYLENOL) 650 MG CR tablet Take 650 mg by mouth 3 (three) times daily.     albuterol (ACCUNEB) 0.63 MG/3ML nebulizer solution Take 1 ampule by nebulization every 4 (four) hours as needed for wheezing.     albuterol (PROVENTIL) (2.5 MG/3ML) 0.083% nebulizer solution UE 1 VIAL IN NEBULIZER EVERY 6 HOURS AS NEEDED FOR WHEEZING/SOB 150 mL 12   Albuterol Sulfate (PROAIR RESPICLICK) 177 (90 Base) MCG/ACT AEPB Inhale 2 puffs into the lungs every 4 (four) hours as needed.     aspirin EC 81 MG tablet Take 81 mg by mouth. Twice a day from 10/12/2020-11/22/2020 Then take once a day starting 11/23/2020     cholecalciferol (VITAMIN D3) 25 MCG (1000 UNIT) tablet Take 1,000 Units by mouth daily.     diphenhydrAMINE HCl, Sleep, (SLEEPING PO) Take 2 tablets by mouth at bedtime.  diphenoxylate-atropine (LOMOTIL) 2.5-0.025 MG tablet TAKE 1 TABLET BY MOUTH 4 (FOUR) TIMES DAILY AS NEEDED FOR DIARRHEA OR LOOSE STOOLS 30 tablet 0   Ensure (ENSURE) Take 237 mLs by mouth daily.     fluconazole (DIFLUCAN) 150 MG tablet 1 tablet by mouth every 3 days for 3 doses. 3 tablet 0   Fluticasone-Umeclidin-Vilant (TRELEGY ELLIPTA) 100-62.5-25 MCG/INH AEPB Inhale 1 puff into the lungs daily. 60 each 11   Fluticasone-Umeclidin-Vilant (TRELEGY ELLIPTA) 100-62.5-25 MCG/INH AEPB Inhale 1 puff into the lungs daily. 28 each 0   furosemide (LASIX) 40 MG tablet TAKE 1 TABLET BY MOUTH EVERY DAY 90 tablet 1   guaiFENesin (MUCINEX) 600 MG  12 hr tablet Take 600 mg by mouth 2 (two) times daily as needed for cough.     levofloxacin (LEVAQUIN) 500 MG tablet Take 1 tablet (500 mg total) by mouth daily for 7 days. 7 tablet 0   levothyroxine (SYNTHROID) 75 MCG tablet TAKE 1 TABLET (75 MCG TOTAL) BY MOUTH DAILY. 90 tablet 3   LORazepam (ATIVAN) 0.5 MG tablet Take 1 tablet (0.5 mg total) by mouth 2 (two) times daily as needed for anxiety. 30 tablet 0   mirtazapine (REMERON) 15 MG tablet TAKE 1/2 TABLET BY MOUTH IN THE EVENING 45 tablet 2   NON FORMULARY Diet: _____ Regular, ___x___ NAS, _______Consistent Carbohydrate, _______NPO _____Other     OVER THE COUNTER MEDICATION Take 1 tablet by mouth 2 (two) times daily. Slippery elm     OXYGEN Inhale 4 L into the lungs continuous.     polyvinyl alcohol (LIQUIFILM TEARS) 1.4 % ophthalmic solution Place 1 drop into both eyes 3 (three) times daily as needed for dry eyes.     predniSONE (DELTASONE) 10 MG tablet TAKE 1 TABLET (10 MG TOTAL) BY MOUTH DAILY WITH BREAKFAST 30 tablet 3   tamsulosin (FLOMAX) 0.4 MG CAPS capsule TAKE 1 CAPSULE BY MOUTH EVERY DAY 90 capsule 3   terbinafine (LAMISIL) 1 % cream Apply 1 application topically 2 (two) times daily. 30 g 1   No current facility-administered medications on file prior to visit.   Allergies  Allergen Reactions   Morphine Other (See Comments)    REACTION: sweats   Social History   Socioeconomic History   Marital status: Married    Spouse name: Not on file   Number of children: Not on file   Years of education: Not on file   Highest education level: Not on file  Occupational History   Occupation: Chief Financial Officer  Tobacco Use   Smoking status: Former    Packs/day: 1.50    Years: 50.00    Pack years: 75.00    Types: Cigarettes    Quit date: 09/17/2008    Years since quitting: 12.8   Smokeless tobacco: Never  Vaping Use   Vaping Use: Never used  Substance and Sexual Activity   Alcohol use: Yes    Alcohol/week: 1.0 standard drink     Types: 1 Glasses of wine per week    Comment: each evening   Drug use: No   Sexual activity: Not on file  Other Topics Concern   Not on file  Social History Narrative   Not on file   Social Determinants of Health   Financial Resource Strain: Not on file  Food Insecurity: Not on file  Transportation Needs: Not on file  Physical Activity: Not on file  Stress: Not on file  Social Connections: Not on file  Intimate Partner Violence: Not on file  Review of Systems  Skin:  Positive for rash.  All other systems reviewed and are negative.     Objective:   Physical Exam Vitals reviewed.  Constitutional:      General: He is not in acute distress.    Appearance: Normal appearance. He is well-developed and underweight. He is not diaphoretic.  HENT:     Mouth/Throat:     Mouth: Mucous membranes are dry.  Cardiovascular:     Rate and Rhythm: Tachycardia present. Rhythm irregular.     Heart sounds: Normal heart sounds. No murmur heard. Pulmonary:     Effort: No tachypnea, accessory muscle usage, respiratory distress or retractions.     Breath sounds: Decreased air movement present. Examination of the right-upper field reveals decreased breath sounds. Examination of the left-upper field reveals decreased breath sounds. Examination of the right-middle field reveals decreased breath sounds. Examination of the left-middle field reveals decreased breath sounds. Examination of the right-lower field reveals decreased breath sounds. Examination of the left-lower field reveals decreased breath sounds. Decreased breath sounds present. No wheezing, rhonchi or rales.  Chest:     Chest wall: No mass, lacerations, deformity, swelling, tenderness, crepitus or edema.  Abdominal:     General: Bowel sounds are normal. There is no distension.     Palpations: Abdomen is soft.     Tenderness: There is no abdominal tenderness. There is no guarding or rebound.     Hernia: No hernia is present.   Musculoskeletal:     Cervical back: Neck supple.     Right lower leg: No edema.     Left lower leg: No edema.  Lymphadenopathy:     Cervical: No cervical adenopathy.  Neurological:     Mental Status: He is alert.  Psychiatric:        Behavior: Behavior is cooperative.          Assessment & Plan:   Agitation I see no evidence of delirium due to an infection.  I believe the abnormality seen on the chest x-ray was likely scar tissue that has been present on imaging of the chest all the way back to a CAT scan in 2014.  Therefore I feel that they can stop the prednisone and the antibiotics.  I believe the agitation is more likely due to the Remeron so I suggested that they continue to hold this medication indefinitely

## 2021-07-07 ENCOUNTER — Ambulatory Visit: Payer: PPO | Admitting: Family Medicine

## 2021-07-07 DIAGNOSIS — E871 Hypo-osmolality and hyponatremia: Secondary | ICD-10-CM | POA: Diagnosis not present

## 2021-07-07 DIAGNOSIS — J9611 Chronic respiratory failure with hypoxia: Secondary | ICD-10-CM | POA: Diagnosis not present

## 2021-07-07 DIAGNOSIS — B0229 Other postherpetic nervous system involvement: Secondary | ICD-10-CM | POA: Diagnosis not present

## 2021-07-07 DIAGNOSIS — Z87891 Personal history of nicotine dependence: Secondary | ICD-10-CM | POA: Diagnosis not present

## 2021-07-07 DIAGNOSIS — L89520 Pressure ulcer of left ankle, unstageable: Secondary | ICD-10-CM | POA: Diagnosis not present

## 2021-07-07 DIAGNOSIS — K219 Gastro-esophageal reflux disease without esophagitis: Secondary | ICD-10-CM | POA: Diagnosis not present

## 2021-07-07 DIAGNOSIS — R338 Other retention of urine: Secondary | ICD-10-CM | POA: Diagnosis not present

## 2021-07-07 DIAGNOSIS — Z7982 Long term (current) use of aspirin: Secondary | ICD-10-CM | POA: Diagnosis not present

## 2021-07-07 DIAGNOSIS — Z9981 Dependence on supplemental oxygen: Secondary | ICD-10-CM | POA: Diagnosis not present

## 2021-07-07 DIAGNOSIS — E559 Vitamin D deficiency, unspecified: Secondary | ICD-10-CM | POA: Diagnosis not present

## 2021-07-07 DIAGNOSIS — D649 Anemia, unspecified: Secondary | ICD-10-CM | POA: Diagnosis not present

## 2021-07-07 DIAGNOSIS — R1310 Dysphagia, unspecified: Secondary | ICD-10-CM | POA: Diagnosis not present

## 2021-07-07 DIAGNOSIS — I700 Atherosclerosis of aorta: Secondary | ICD-10-CM | POA: Diagnosis not present

## 2021-07-07 DIAGNOSIS — Z8673 Personal history of transient ischemic attack (TIA), and cerebral infarction without residual deficits: Secondary | ICD-10-CM | POA: Diagnosis not present

## 2021-07-07 DIAGNOSIS — G4700 Insomnia, unspecified: Secondary | ICD-10-CM | POA: Diagnosis not present

## 2021-07-07 DIAGNOSIS — E782 Mixed hyperlipidemia: Secondary | ICD-10-CM | POA: Diagnosis not present

## 2021-07-07 DIAGNOSIS — K529 Noninfective gastroenteritis and colitis, unspecified: Secondary | ICD-10-CM | POA: Diagnosis not present

## 2021-07-07 DIAGNOSIS — E039 Hypothyroidism, unspecified: Secondary | ICD-10-CM | POA: Diagnosis not present

## 2021-07-07 DIAGNOSIS — J449 Chronic obstructive pulmonary disease, unspecified: Secondary | ICD-10-CM | POA: Diagnosis not present

## 2021-07-07 DIAGNOSIS — E441 Mild protein-calorie malnutrition: Secondary | ICD-10-CM | POA: Diagnosis not present

## 2021-07-07 DIAGNOSIS — Z7952 Long term (current) use of systemic steroids: Secondary | ICD-10-CM | POA: Diagnosis not present

## 2021-07-07 DIAGNOSIS — K649 Unspecified hemorrhoids: Secondary | ICD-10-CM | POA: Diagnosis not present

## 2021-07-07 DIAGNOSIS — N401 Enlarged prostate with lower urinary tract symptoms: Secondary | ICD-10-CM | POA: Diagnosis not present

## 2021-07-09 ENCOUNTER — Other Ambulatory Visit: Payer: Self-pay | Admitting: Family Medicine

## 2021-07-12 ENCOUNTER — Telehealth: Payer: Self-pay | Admitting: *Deleted

## 2021-07-12 NOTE — Telephone Encounter (Signed)
Received call from [place], [facility] with [facility].   Reports that patient is having cough with clear to white sputum and noted to have diminished breath sounds in RLL and RML of lung.  SpO2 97% on 3L/min via [place], HR 80 apical, BP 82/40 at rest and 107/68 after exertion.   Appointment scheduled per patient wife request.

## 2021-07-13 ENCOUNTER — Other Ambulatory Visit: Payer: Self-pay

## 2021-07-13 ENCOUNTER — Ambulatory Visit (INDEPENDENT_AMBULATORY_CARE_PROVIDER_SITE_OTHER): Payer: PPO | Admitting: Family Medicine

## 2021-07-13 VITALS — BP 128/70 | HR 92 | Temp 97.7°F | Wt 118.8 lb

## 2021-07-13 DIAGNOSIS — J441 Chronic obstructive pulmonary disease with (acute) exacerbation: Secondary | ICD-10-CM

## 2021-07-13 DIAGNOSIS — R059 Cough, unspecified: Secondary | ICD-10-CM | POA: Diagnosis not present

## 2021-07-13 MED ORDER — METHYLPREDNISOLONE ACETATE 40 MG/ML IJ SUSP
60.0000 mg | Freq: Once | INTRAMUSCULAR | Status: AC
  Administered 2021-07-13: 60 mg via INTRAMUSCULAR

## 2021-07-13 MED ORDER — PREDNISONE 20 MG PO TABS: 60.0000 mg | ORAL_TABLET | Freq: Every day | ORAL | 0 refills | Status: DC

## 2021-07-13 NOTE — Progress Notes (Signed)
Subjective:    Patient ID: John Black, male    DOB: 1929-04-04, 87 y.o.   [name]: [id number] Patient is a very pleasant 87 year old Caucasian gentleman who has end-stage COPD on 3 to 4 L of oxygen daily.  He became sick on Monday with head congestion rhinorrhea and a cough.  This is progressively worsened over the week.  They have gradually increased his oxygen to 6 L at times.  His oxygen is dropping into the 80s.  He continues to have cough and chest congestion.  On examination today, the patient is relaxed and jovial sitting in his wheelchair.  After walking into the clinic and getting registered his oxygen is down to 81% on 4 L.  We bumped him up to 5 L and his oxygen was 95% sitting comfortably in the exam room.  He has diminished breath sounds bilaterally right worse than left.  There are no significant crackles or rails posteriorly however he does have prominent right anterior crackles which are chronic finding.  He reports a cough and purulent sputum but denies any shortness of breath.  He does have rhinorrhea and head congestion Past Medical History:  Diagnosis Date   Allergic rhinitis    Cataract    s/p removal   Chronic respiratory failure (HCC)    oxygen 3L at home   Emphysema    Hypothyroid    PNA (pneumonia)    Prostate cancer [facility])    Past Surgical History:  Procedure Laterality Date   ADENOIDECTOMY     HIP ARTHROPLASTY Right 10/10/2020   Procedure: ARTHROPLASTY BIPOLAR HIP (HEMIARTHROPLASTY);  Surgeon: Mordecai Rasmussen, MD;  Location: [facility];  Service: Orthopedics;  Laterality: Right;   ROTATOR CUFF REPAIR  1990,2009   bilateral   Seed implant for prostate cancer  2000   TONSILLECTOMY     TRANSURETHRAL RESECTION OF PROSTATE  2001   x2   Current Outpatient Medications on File Prior to Visit  Medication Sig Dispense Refill   acetaminophen (TYLENOL) 500 MG tablet Take 500 mg by mouth every 6 (six) hours as needed.     acetaminophen (TYLENOL) 650 MG CR tablet Take 650 mg  by mouth 3 (three) times daily.     albuterol (PROVENTIL) (2.5 MG/3ML) 0.083% nebulizer solution UE 1 VIAL IN NEBULIZER EVERY 6 HOURS AS NEEDED FOR WHEEZING/SOB 150 mL 12   Albuterol Sulfate (PROAIR RESPICLICK) 341 (90 Base) MCG/ACT AEPB Inhale 2 puffs into the lungs every 4 (four) hours as needed.     aspirin EC 81 MG tablet Take 81 mg by mouth. Twice a day from 10/12/2020-11/22/2020 Then take once a day starting 11/23/2020     cholecalciferol (VITAMIN D3) 25 MCG (1000 UNIT) tablet Take 1,000 Units by mouth daily.     diphenhydrAMINE HCl, Sleep, (SLEEPING PO) Take 2 tablets by mouth at bedtime.     diphenoxylate-atropine (LOMOTIL) 2.5-0.025 MG tablet TAKE 1 TABLET BY MOUTH 4 (FOUR) TIMES DAILY AS NEEDED FOR DIARRHEA OR LOOSE STOOLS 30 tablet 0   Ensure (ENSURE) Take 237 mLs by mouth daily.     Fluticasone-Umeclidin-Vilant (TRELEGY ELLIPTA) 100-62.5-25 MCG/INH AEPB Inhale 1 puff into the lungs daily. 60 each 11   Fluticasone-Umeclidin-Vilant (TRELEGY ELLIPTA) 100-62.5-25 MCG/INH AEPB Inhale 1 puff into the lungs daily. 28 each 0   furosemide (LASIX) 40 MG tablet TAKE 1 TABLET BY MOUTH EVERY DAY 90 tablet 1   guaiFENesin (MUCINEX) 600 MG 12 hr tablet Take 600 mg by mouth 2 (two) times daily as needed  for cough.     levothyroxine (SYNTHROID) 75 MCG tablet TAKE 1 TABLET (75 MCG TOTAL) BY MOUTH DAILY. 90 tablet 3   LORazepam (ATIVAN) 0.5 MG tablet Take 1 tablet (0.5 mg total) by mouth 2 (two) times daily as needed for anxiety. 30 tablet 0   NON FORMULARY Diet: _____ Regular, ___x___ NAS, _______Consistent Carbohydrate, _______NPO _____Other     OVER THE COUNTER MEDICATION Take 1 tablet by mouth 2 (two) times daily. Slippery elm     OXYGEN Inhale 4 L into the lungs continuous.     oxymetazoline (AFRIN) 0.05 % nasal spray Place 1 spray into both nostrils 2 (two) times daily.     polyvinyl alcohol (LIQUIFILM TEARS) 1.4 % ophthalmic solution Place 1 drop into both eyes 3 (three) times daily as needed for  dry eyes.     tamsulosin (FLOMAX) 0.4 MG CAPS capsule TAKE 1 CAPSULE BY MOUTH EVERY DAY 90 capsule 3   terbinafine (LAMISIL) 1 % cream Apply 1 application topically 2 (two) times daily. 30 g 1   No current facility-administered medications on file prior to visit.   Allergies  Allergen Reactions   Morphine Other (See Comments)    REACTION: sweats   Social History   Socioeconomic History   Marital status: Married    Spouse name: Not on file   Number of children: Not on file   Years of education: Not on file   Highest education level: Not on file  Occupational History   Occupation: Chief Financial Officer  Tobacco Use   Smoking status: Former    Packs/day: 1.50    Years: 50.00    Pack years: 75.00    Types: Cigarettes    Quit date: 09/17/2008    Years since quitting: 12.8   Smokeless tobacco: Never  Vaping Use   Vaping Use: Never used  Substance and Sexual Activity   Alcohol use: Yes    Alcohol/week: 1.0 standard drink    Types: 1 Glasses of wine per week    Comment: each evening   Drug use: No   Sexual activity: Not on file  Other Topics Concern   Not on file  Social History Narrative   Not on file   Social Determinants of Health   Financial Resource Strain: Not on file  Food Insecurity: Not on file  Transportation Needs: Not on file  Physical Activity: Not on file  Stress: Not on file  Social Connections: Not on file  Intimate Partner Violence: Not on file      Review of Systems  Skin:  Positive for rash.  All other systems reviewed and are negative.     Objective:   Physical Exam Vitals reviewed.  Constitutional:      General: He is not in acute distress.    Appearance: Normal appearance. He is well-developed and underweight. He is not diaphoretic.  HENT:     Mouth/Throat:     Mouth: Mucous membranes are dry.  Cardiovascular:     Rate and Rhythm: Tachycardia present. Rhythm irregular.     Heart sounds: Normal heart sounds. No murmur heard. Pulmonary:      Effort: No tachypnea, accessory muscle usage, respiratory distress or retractions.     Breath sounds: Decreased air movement present. Examination of the right-upper field reveals decreased breath sounds. Examination of the left-upper field reveals decreased breath sounds. Examination of the right-middle field reveals decreased breath sounds. Examination of the left-middle field reveals decreased breath sounds. Examination of the right-lower field reveals decreased  breath sounds. Examination of the left-lower field reveals decreased breath sounds. Decreased breath sounds present. No wheezing, rhonchi or rales.  Chest:     Chest wall: No mass, lacerations, deformity, swelling, tenderness, crepitus or edema.  Abdominal:     General: Bowel sounds are normal. There is no distension.     Palpations: Abdomen is soft.     Tenderness: There is no abdominal tenderness. There is no guarding or rebound.     Hernia: No hernia is present.  Musculoskeletal:     Cervical back: Neck supple.     Right lower leg: No edema.     Left lower leg: No edema.  Lymphadenopathy:     Cervical: No cervical adenopathy.  Neurological:     Mental Status: He is alert.  Psychiatric:        Behavior: Behavior is cooperative.          Assessment & Plan:   Cough, unspecified type - Plan: methylPREDNISolone acetate (DEPO-MEDROL) injection 60 mg  COPD exacerbation (HCC) Increase oxygen between 4 to 6 L to maintain oxygen saturations between 90 and 94%.  Begin using albuterol 2.5 mg nebs every 4 hours on a scheduled basis.  The patient received 60 mg of Depo-Medrol IM x1 now and have asked him to start 60 mg daily thereafter.  Begin Levaquin 500 mg daily now.  Recheck in 24 hours or go to the emergency room if worsening

## 2021-07-14 ENCOUNTER — Telehealth: Payer: Self-pay | Admitting: *Deleted

## 2021-07-14 ENCOUNTER — Ambulatory Visit (INDEPENDENT_AMBULATORY_CARE_PROVIDER_SITE_OTHER): Payer: PPO | Admitting: Family Medicine

## 2021-07-14 ENCOUNTER — Encounter: Payer: Self-pay | Admitting: Family Medicine

## 2021-07-14 VITALS — HR 74 | Temp 97.6°F | Resp 20

## 2021-07-14 DIAGNOSIS — U071 COVID-19: Secondary | ICD-10-CM

## 2021-07-14 DIAGNOSIS — R531 Weakness: Secondary | ICD-10-CM

## 2021-07-14 DIAGNOSIS — J441 Chronic obstructive pulmonary disease with (acute) exacerbation: Secondary | ICD-10-CM

## 2021-07-14 MED ORDER — NIRMATRELVIR/RITONAVIR (PAXLOVID)TABLET: 3.0000 | ORAL_TABLET | Freq: Two times a day (BID) | ORAL | 0 refills | Status: AC

## 2021-07-14 NOTE — Telephone Encounter (Signed)
Received call from patient daughter Jeral Fruit.   Reports that patient tested positive for COVID on 07/13/2021 with home test.   Advised to continue symptom management with OTC medications: Tylenol/ Ibuprofen for fever/ body aches, Mucinex/ Delsym for cough/ chest congestion, Afrin/Sudafed/nasal saline for sinus pressure/ nasal congestion.  GFR >60. Order for Paxlovid sent to pharmacy. Advised possible SE include nausea, diarrhea, loss of taste and hypertension.  If chest pain, shortness of breath, fever >104 that is unresponsive to antipyretics noted, or if unable to tolerate fluids, advised to go to ER for evaluation.   Advised that the CDC recommends the following criteria prior to ending isolation in vaccinated and unvaccinated persons: at least 5 days since symptoms onset, then an additional 5 days of masking  AND 3 days fever free without antipyretics (Tylenol or Ibuprofen) AND improvement in respiratory symptoms.  Advised to stop Levaquin prescribed at OV as ABTx will not treat viral infection.   Patient daughter states that she would still like PCP to evaluate today. Advised to keep patient in car and contact us once they arrive. PCP will see patient in parking lot.

## 2021-07-14 NOTE — Telephone Encounter (Signed)
Received order from PCP to extend [facility] SN x2 weeks S/P COVID, COPD exacerbation.   VO given to [place], [facility] Caseworker with [place] [phone], ext [phone].  Orders accepted, but was advised that insurance must approve extension as well.

## 2021-07-14 NOTE — Progress Notes (Signed)
Subjective:    Patient ID: John Black, male    DOB: Oct 11, 1928, 87 y.o.   [name]: [id number] 07/13/21 Patient is a very pleasant 87 year old Caucasian gentleman who has end-stage COPD on 3 to 4 L of oxygen daily.  He became sick on Monday with head congestion rhinorrhea and a cough.  This is progressively worsened over the week.  They have gradually increased his oxygen to 6 L at times.  His oxygen is dropping into the 80s.  He continues to have cough and chest congestion.  On examination today, the patient is relaxed and jovial sitting in his wheelchair.  After walking into the clinic and getting registered his oxygen is down to 81% on 4 L.  We bumped him up to 5 L and his oxygen was 95% sitting comfortably in the exam room.  He has diminished breath sounds bilaterally right worse than left.  There are no significant crackles or rails posteriorly however he does have prominent right anterior crackles which are chronic finding.  He reports a cough and purulent sputum but denies any shortness of breath.  He does have rhinorrhea and head congestion.  At that time, my plan was:  Increase oxygen between 4 to 6 L to maintain oxygen saturations between 90 and 94%.  Begin using albuterol 2.5 mg nebs every 4 hours on a scheduled basis.  The patient received 60 mg of Depo-Medrol IM x1 now and have asked him to start 60 mg daily thereafter.  Begin Levaquin 500 mg daily now.  Recheck in 24 hours or go to the emergency room if worsening  07/14/21 Patient was seen today in our parking lot.  Last night his wife began running a fever so that her daughter performed a home COVID test.  Both the patient and his wife tested positive for COVID.  As result we have already called out Paxlovid however he is yet to start the medication.  We did recommend that he discontinue Levaquin.  He is on prednisone 60 mg a day and albuterol 2.5 mg nebs every 4 hours.  He looks much better today.  He is sitting comfortably in his car on  4 L of oxygen maintaining oxygen saturations at 97%.  His heart rate is well controlled in the 70s at 74.  He is in no respiratory distress.  He is smiling and laughing.  He denies any chest pain or pleurisy or hemoptysis.  He has remained afebrile overnight. Past Medical History:  Diagnosis Date   Allergic rhinitis    Cataract    s/p removal   Chronic respiratory failure (HCC)    oxygen 3L at home   Emphysema    Hypothyroid    PNA (pneumonia)    Prostate cancer [facility])    Past Surgical History:  Procedure Laterality Date   ADENOIDECTOMY     HIP ARTHROPLASTY Right 10/10/2020   Procedure: ARTHROPLASTY BIPOLAR HIP (HEMIARTHROPLASTY);  Surgeon: Mordecai Rasmussen, MD;  Location: [facility];  Service: Orthopedics;  Laterality: Right;   ROTATOR CUFF REPAIR  1990,2009   bilateral   Seed implant for prostate cancer  2000   TONSILLECTOMY     TRANSURETHRAL RESECTION OF PROSTATE  2001   x2   Current Outpatient Medications on File Prior to Visit  Medication Sig Dispense Refill   acetaminophen (TYLENOL) 500 MG tablet Take 500 mg by mouth every 6 (six) hours as needed.     acetaminophen (TYLENOL) 650 MG CR tablet Take 650 mg by mouth 3 (  three) times daily.     albuterol (PROVENTIL) (2.5 MG/3ML) 0.083% nebulizer solution UE 1 VIAL IN NEBULIZER EVERY 6 HOURS AS NEEDED FOR WHEEZING/SOB 150 mL 12   Albuterol Sulfate (PROAIR RESPICLICK) 671 (90 Base) MCG/ACT AEPB Inhale 2 puffs into the lungs every 4 (four) hours as needed.     aspirin EC 81 MG tablet Take 81 mg by mouth. Twice a day from 10/12/2020-11/22/2020 Then take once a day starting 11/23/2020     cholecalciferol (VITAMIN D3) 25 MCG (1000 UNIT) tablet Take 1,000 Units by mouth daily.     diphenhydrAMINE HCl, Sleep, (SLEEPING PO) Take 2 tablets by mouth at bedtime.     diphenoxylate-atropine (LOMOTIL) 2.5-0.025 MG tablet TAKE 1 TABLET BY MOUTH 4 (FOUR) TIMES DAILY AS NEEDED FOR DIARRHEA OR LOOSE STOOLS 30 tablet 0   Ensure (ENSURE) Take 237 mLs by mouth  daily.     Fluticasone-Umeclidin-Vilant (TRELEGY ELLIPTA) 100-62.5-25 MCG/INH AEPB Inhale 1 puff into the lungs daily. 60 each 11   Fluticasone-Umeclidin-Vilant (TRELEGY ELLIPTA) 100-62.5-25 MCG/INH AEPB Inhale 1 puff into the lungs daily. 28 each 0   furosemide (LASIX) 40 MG tablet TAKE 1 TABLET BY MOUTH EVERY DAY 90 tablet 1   guaiFENesin (MUCINEX) 600 MG 12 hr tablet Take 600 mg by mouth 2 (two) times daily as needed for cough.     levothyroxine (SYNTHROID) 75 MCG tablet TAKE 1 TABLET (75 MCG TOTAL) BY MOUTH DAILY. 90 tablet 3   LORazepam (ATIVAN) 0.5 MG tablet Take 1 tablet (0.5 mg total) by mouth 2 (two) times daily as needed for anxiety. 30 tablet 0   NON FORMULARY Diet: _____ Regular, ___x___ NAS, _______Consistent Carbohydrate, _______NPO _____Other     OVER THE COUNTER MEDICATION Take 1 tablet by mouth 2 (two) times daily. Slippery elm     OXYGEN Inhale 4 L into the lungs continuous.     oxymetazoline (AFRIN) 0.05 % nasal spray Place 1 spray into both nostrils 2 (two) times daily.     polyvinyl alcohol (LIQUIFILM TEARS) 1.4 % ophthalmic solution Place 1 drop into both eyes 3 (three) times daily as needed for dry eyes.     predniSONE (DELTASONE) 20 MG tablet Take 3 tablets (60 mg total) by mouth daily with breakfast. 18 tablet 0   tamsulosin (FLOMAX) 0.4 MG CAPS capsule TAKE 1 CAPSULE BY MOUTH EVERY DAY 90 capsule 3   terbinafine (LAMISIL) 1 % cream Apply 1 application topically 2 (two) times daily. 30 g 1   No current facility-administered medications on file prior to visit.   Allergies  Allergen Reactions   Morphine Other (See Comments)    REACTION: sweats   Social History   Socioeconomic History   Marital status: Married    Spouse name: Not on file   Number of children: Not on file   Years of education: Not on file   Highest education level: Not on file  Occupational History   Occupation: Chief Financial Officer  Tobacco Use   Smoking status: Former    Packs/day: 1.50    Years:  50.00    Pack years: 75.00    Types: Cigarettes    Quit date: 09/17/2008    Years since quitting: 12.8   Smokeless tobacco: Never  Vaping Use   Vaping Use: Never used  Substance and Sexual Activity   Alcohol use: Yes    Alcohol/week: 1.0 standard drink    Types: 1 Glasses of wine per week    Comment: each evening   Drug use: No  Sexual activity: Not on file  Other Topics Concern   Not on file  Social History Narrative   Not on file   Social Determinants of Health   Financial Resource Strain: Not on file  Food Insecurity: Not on file  Transportation Needs: Not on file  Physical Activity: Not on file  Stress: Not on file  Social Connections: Not on file  Intimate Partner Violence: Not on file      Review of Systems  Skin:  Positive for rash.  All other systems reviewed and are negative.     Objective:   Physical Exam Vitals reviewed.  Constitutional:      General: He is not in acute distress.    Appearance: Normal appearance. He is well-developed and underweight. He is not diaphoretic.  HENT:     Mouth/Throat:     Mouth: Mucous membranes are dry.  Cardiovascular:     Rate and Rhythm: Tachycardia present. Rhythm irregular.     Heart sounds: Normal heart sounds. No murmur heard. Pulmonary:     Effort: No tachypnea, accessory muscle usage, respiratory distress or retractions.     Breath sounds: Decreased air movement present. Examination of the right-upper field reveals decreased breath sounds. Examination of the left-upper field reveals decreased breath sounds. Examination of the right-middle field reveals decreased breath sounds. Examination of the left-middle field reveals decreased breath sounds. Examination of the right-lower field reveals decreased breath sounds. Examination of the left-lower field reveals decreased breath sounds. Decreased breath sounds present. No wheezing, rhonchi or rales.  Chest:     Chest wall: No mass, lacerations, deformity, swelling,  tenderness, crepitus or edema.  Abdominal:     General: Bowel sounds are normal. There is no distension.     Palpations: Abdomen is soft.     Tenderness: There is no abdominal tenderness. There is no guarding or rebound.     Hernia: No hernia is present.  Musculoskeletal:     Cervical back: Neck supple.     Right lower leg: No edema.     Left lower leg: No edema.  Lymphadenopathy:     Cervical: No cervical adenopathy.  Neurological:     Mental Status: He is alert.  Psychiatric:        Behavior: Behavior is cooperative.          Assessment & Plan:  COPD exacerbation ([place])  COVID-19 Thankfully, the patient seems to be doing relatively well.  He is much closer to his baseline today than yesterday.  Wean albuterol back to 2.5 mg every 6 hours.  Continue prednisone 60 mg a day for the next 7 days and then decrease back to his maintenance dose of 10 mg a day.  Discontinue Levaquin.  However of asked the patient to begin Paxlovid.  Seek medical attention immediately over the weekend if worsening.  Patient should go to the hospital if pulse oximetry is less than 90 despite being on 4 L.  He should also go to the hospital if he is having increasing chest pain or any respiratory distress.

## 2021-07-18 NOTE — Progress Notes (Signed)
Chronic Care Management Pharmacy Note  07/21/2021 Name:  John Black [name]:  [id number] DOB:  20-Dec-1928  Subjective: John Black is an 87 y.o. year old male who is a primary patient of Pickard, Cammie Mcgee, MD.  The CCM team was consulted for assistance with disease management and care coordination needs.    Engaged with patient by telephone for follow up visit in response to provider referral for pharmacy case management and/or care coordination services.   Consent to Services:  The patient was given the following information about Chronic Care Management services today, agreed to services, and gave verbal consent: 1. CCM service includes personalized support from designated clinical staff supervised by the primary care provider, including individualized plan of care and coordination with other care providers 2. 24/7 contact phone numbers for assistance for urgent and routine care needs. 3. Service will only be billed when office clinical staff spend 20 minutes or more in a month to coordinate care. 4. Only one practitioner may furnish and bill the service in a calendar month. 5.The patient may stop CCM services at any time (effective at the end of the month) by phone call to the office staff. 6. The patient will be responsible for cost sharing (co-pay) of up to 20% of the service fee (after annual deductible is met). Patient agreed to services and consent obtained.  Patient Care Team: Susy Frizzle, MD as PCP - General (Family Medicine) Satira Sark, MD as PCP - Cardiology (Cardiology) Edythe Clarity, [facility] as Pharmacist (Pharmacist)  Recent office visits: 07/14/21 Dennard Schaumann) - started on Paxlovid, albuterol weaned back and he is supposed to continue prednisone 4m daily for the next week.  Then back down to maintenance dose of 195mdaily. 12/15/20 (Pickard) - due to some swelling in ankles, increased Lasix.  Also recommended compression hose and increasing leg  movements to reduce swelling.  Recent consult visits: 11/22/20 Pulmonology WeTanda RockersMD. XrBurgess Estellereformed. CHANGED mucinex up to 1200 mg every 12 hours. 11/18/20 GrGerlene FeeNP. For Pneumonia. CHANGED mucinex to 600 mg twice daily   11/17/20  GrGerlene FeeNP. For COPD. Xray performed. No medication changes. 11/07/20 GrGerlene FeeNP. For healing on right hip.CHANGED Acetaminophen to 650 mg three times daily  Hospital visits: 10/20/2020 MeKathie DikeMD. Syncope and collapse. Per note patient is currently in the hospital.  10/09/20 Tat, DaShanon BrowMD For Acute right femoral neck fracture, Closed fracture of right hip. Surgery done on 10/10/20 ARTHROPLASTY BIPOLAR HIP (HEMIARTHROPLASTY). DISCHARGED on 10/12/20. Continue medications, no medication changes.  09/08/20 RaMendel CorningMD. For Stroke. STARTED Zetia 10 mg daily, Prednisone taper pack, and Aspirin 81 daily  Objective:  Lab Results  Component Value Date   CREATININE 0.98 04/28/2021   BUN 27 (H) 04/28/2021   GFR 79.57 08/07/2019   GFRNONAA >60 04/28/2021   GFRAA 76 02/07/2021   NA 137 04/28/2021   K 4.5 04/28/2021   CALCIUM 9.5 04/28/2021   CO2 35 (H) 04/28/2021   GLUCOSE 99 04/28/2021    Lab Results  Component Value Date/Time   HGBA1C 5.9 (H) 02/07/2021 04:37 PM   HGBA1C 5.7 (H) 09/09/2020 08:00 AM   GFR 79.57 08/07/2019 02:42 PM    Last diabetic Eye exam: No results found for: HMDIABEYEEXA  Last diabetic Foot exam: No results found for: HMDIABFOOTEX   Lab Results  Component Value Date   CHOL 270 (H) 09/09/2020   HDL 121 09/09/2020   LDLCALC 140 (  H) 09/09/2020   TRIG 47 09/09/2020   CHOLHDL 2.2 09/09/2020    Hepatic Function Latest Ref Rng & Units 02/07/2021 12/15/2020 10/21/2020  Total Protein 6.1 - 8.1 g/dL 5.8(L) 6.2 6.5  Albumin 3.5 - 5.0 g/dL - - 3.4(L)  AST 10 - 35 U/L 21 18 32  ALT 9 - 46 U/L 21 16 52(H)  Alk Phosphatase 38 - 126 U/L - - 74  Total Bilirubin 0.2 - 1.2 mg/dL 0.2 0.3 0.5     Lab Results  Component Value Date/Time   TSH 3.236 10/21/2020 05:04 AM   TSH 1.104 02/16/2020 06:21 PM   TSH 1.59 11/11/2019 09:24 AM   TSH 1.44 01/27/2019 11:13 AM   FREET4 1.6 11/11/2019 09:24 AM   FREET4 1.5 01/27/2019 11:13 AM    CBC Latest Ref Rng & Units 04/28/2021 02/07/2021 12/15/2020  WBC 4.0 - 10.5 K/uL 7.6 8.6 8.2  Hemoglobin 13.0 - 17.0 g/dL 12.7(L) 12.0(L) 12.4(L)  Hematocrit 39.0 - 52.0 % 40.8 35.9(L) 38.0(L)  Platelets 150 - 400 K/uL 363 351 343    Lab Results  Component Value Date/Time   VD25OH 28.37 (L) 10/09/2020 08:00 AM    Clinical ASCVD: No  The ASCVD Risk score (Arnett DK, et al., 2019) failed to calculate for the following reasons:   The 2019 ASCVD risk score is only valid for ages 47 to 61    Depression screen PHQ 2/9 07/12/2020 03/11/2020 11/11/2019  Decreased Interest 0 0 0  Down, Depressed, Hopeless 0 0 0  PHQ - 2 Score 0 0 0  Altered sleeping - - 0  Tired, decreased energy - - 0  Change in appetite - - 0  Feeling bad or failure about yourself  - - 0  Trouble concentrating - - 0  Moving slowly or fidgety/restless - - 0  Suicidal thoughts - - 0  PHQ-9 Score - - 0  Difficult doing work/chores - - Not difficult at all  Some recent data might be hidden      Social History   Tobacco Use  Smoking Status Former   Packs/day: 1.50   Years: 50.00   Pack years: 75.00   Types: Cigarettes   Quit date: 09/17/2008   Years since quitting: 12.8  Smokeless Tobacco Never   BP Readings from Last 3 Encounters:  07/13/21 128/70  07/06/21 110/62  06/08/21 (!) 110/58   Pulse Readings from Last 3 Encounters:  07/14/21 74  07/13/21 92  07/06/21 90   Wt Readings from Last 3 Encounters:  07/13/21 118 lb 12.8 oz (53.9 kg)  07/06/21 121 lb 6.4 oz (55.1 kg)  06/08/21 122 lb (55.3 kg)   BMI Readings from Last 3 Encounters:  07/13/21 19.17 kg/m  07/06/21 19.59 kg/m  06/08/21 19.69 kg/m    Assessment/Interventions: Review of patient past medical  history, allergies, medications, health status, including review of consultants reports, laboratory and other test data, was performed as part of comprehensive evaluation and provision of chronic care management services.   SDOH:  (Social Determinants of Health) assessments and interventions performed: Yes   Financial Resource Strain: Not on file    SDOH Screenings   Alcohol Screen: Not on file  Depression (TDV7-6): Not on file  Financial Resource Strain: Not on file  Food Insecurity: Not on file  Housing: Not on file  Physical Activity: Not on file  Social Connections: Not on file  Stress: Not on file  Tobacco Use: Medium Risk   Smoking Tobacco Use: Former  Smokeless Tobacco Use: Never   Passive Exposure: Not on file  Transportation Needs: Not on file    [place]  Allergies  Allergen Reactions   Morphine Other (See Comments)    REACTION: sweats    Medications Reviewed Today     Reviewed by Edythe Clarity, [facility] (Pharmacist) on 07/21/21 at 0939  Med List Status: <None>   Medication Order Taking? Sig Documenting Provider Last Dose Status Informant  acetaminophen (TYLENOL) 500 MG tablet [id number] Yes Take 500 mg by mouth every 6 (six) hours as needed. [provider] Taking Active Spouse/Significant Other  acetaminophen (TYLENOL) 650 MG CR tablet [id number] Yes Take 650 mg by mouth 3 (three) times daily. [provider] Taking Active Spouse/Significant Other  albuterol (PROVENTIL) (2.5 MG/3ML) 0.083% nebulizer solution [id number] Yes UE 1 VIAL IN NEBULIZER EVERY 6 HOURS AS NEEDED FOR WHEEZING/SOB Susy Frizzle, MD Taking Active   Albuterol Sulfate (PROAIR RESPICLICK) 867 (90 Base) MCG/ACT AEPB [id number] Yes Inhale 2 puffs into the lungs every 4 (four) hours as needed. [provider] Taking Active Spouse/Significant Other  aspirin EC 81 MG tablet [id number] Yes Take 81 mg by mouth. Twice a day from 10/12/2020-11/22/2020 Then take once a day  starting 11/23/2020 [provider] Taking Active Spouse/Significant Other  cholecalciferol (VITAMIN D3) 25 MCG (1000 UNIT) tablet [id number] Yes Take 1,000 Units by mouth daily. [provider] Taking Active Spouse/Significant Other  diphenhydrAMINE HCl, Sleep, (SLEEPING PO) [id number] Yes Take 2 tablets by mouth at bedtime. [provider] Taking Active Spouse/Significant Other  diphenoxylate-atropine (LOMOTIL) 2.5-0.025 MG tablet [id number] Yes TAKE 1 TABLET BY MOUTH 4 (FOUR) TIMES DAILY AS NEEDED FOR DIARRHEA OR LOOSE STOOLS Susy Frizzle, MD Taking Active Spouse/Significant Other  Ensure [facility]) [id number] Yes Take 237 mLs by mouth daily. [provider] Taking Active Spouse/Significant Other  Fluticasone-Umeclidin-Vilant (TRELEGY ELLIPTA) 100-62.5-25 MCG/INH AEPB [id number] Yes Inhale 1 puff into the lungs daily. Tanda Rockers, MD Taking Active Spouse/Significant Other  Fluticasone-Umeclidin-Vilant (TRELEGY ELLIPTA) 100-62.5-25 MCG/INH AEPB [id number] Yes Inhale 1 puff into the lungs daily. Tanda Rockers, MD Taking Active   furosemide (LASIX) 40 MG tablet [id number] Yes TAKE 1 TABLET BY MOUTH EVERY DAY Susy Frizzle, MD Taking Active   guaiFENesin (MUCINEX) 600 MG 12 hr tablet [id number] Yes Take 600 mg by mouth 2 (two) times daily as needed for cough. [provider] Taking Active Spouse/Significant Other  levothyroxine (SYNTHROID) 75 MCG tablet [id number] Yes TAKE 1 TABLET (75 MCG TOTAL) BY MOUTH DAILY. Susy Frizzle, MD Taking Active   LORazepam (ATIVAN) 0.5 MG tablet [id number] Yes Take 1 tablet (0.5 mg total) by mouth 2 (two) times daily as needed for anxiety. Susy Frizzle, MD Taking Active   nirmatrelvir/ritonavir EUA (PAXLOVID) 20 x 150 MG & 10 x 100MG TABS [id number]  Take 3 tablets by mouth 2 (two) times daily for 5 days. (Take nirmatrelvir 150 mg two tablets twice daily for 5 days and ritonavir 100 mg one tablet twice daily for 5 days).  Patient GFR is >60. Susy Frizzle, MD  Expired 07/19/21 2359   NON Mervyn Gay [id number] Yes Diet: _____ Regular, ___x___ NAS, _______Consistent Carbohydrate, _______NPO _____Other [provider] Taking Active Spouse/Significant Other  OVER THE COUNTER MEDICATION [id number] Yes Take 1 tablet by mouth 2 (two) times daily. [place] [provider] Taking Active Spouse/Significant Other  OXYGEN [id number] Yes Inhale 4 L into the lungs continuous. [provider] Taking Active Spouse/Significant Other  Med Note Lolita Cram, SALLY C   Mon Oct 24, 2020  2:06 PM)    oxymetazoline (AFRIN) 0.05 % nasal spray [id number] Yes Place 1 spray into both nostrils 2 (two) times daily. [provider] Taking Active   polyvinyl alcohol (LIQUIFILM TEARS) 1.4 % ophthalmic solution [id number] Yes Place 1 drop into both eyes 3 (three) times daily as needed for dry eyes. [provider] Taking Active Spouse/Significant Other  predniSONE (DELTASONE) 20 MG tablet [id number] Yes Take 3 tablets (60 mg total) by mouth daily with breakfast. Susy Frizzle, MD Taking Active   tamsulosin (FLOMAX) 0.4 MG CAPS capsule [id number] Yes TAKE 1 CAPSULE BY MOUTH EVERY DAY [place], Modena Nunnery, MD Taking Active Spouse/Significant Other  terbinafine (LAMISIL) 1 % cream [id number] Yes Apply 1 application topically 2 (two) times daily. Kem Parkinson, PA-C Taking Active             Patient Active Problem List   Diagnosis Date Noted   Bilateral rotator cuff syndrome 11/07/2020   Syncope and collapse 10/20/2020   Hyperglycemia 10/19/2020   Aortic atherosclerosis ([place]) 10/15/2020   GERD without esophagitis 10/15/2020   Acute blood loss as cause of postoperative anemia 10/15/2020   Vitamin D deficiency 10/15/2020   Closed fracture of right hip ([place]) 10/09/2020   Dysphagia 07/24/2019   Mild protein-calorie malnutrition ([place]) 02/24/2019   Gait instability 01/27/2019   Right inguinal  hernia    Chronic diarrhea 12/06/2017   Inguinal hernia 03/02/2014   Constipation 03/02/2014   Chronic respiratory failure with hypoxia ([place]) 12/09/2013   Hemorrhoids 09/28/2013   Hyperlipidemia 06/28/2013   Sinusitis, chronic 11/28/2012   COPD with acute exacerbation ([place]) 11/10/2012   BPH (benign prostatic hyperplasia) 10/22/2012   Insomnia 10/22/2012   HCAP (healthcare-associated pneumonia) 09/05/2012   Hypothyroidism 06/19/2012   Atypical nevi 06/19/2012   Seborrheic keratosis 06/19/2012   History of prostate cancer 11/07/2010   Allergic rhinitis 11/07/2010   COPD GOLD 3/ group D  02 dep 11/07/2010    Immunization History  Administered Date(s) Administered   Fluad Quad(high Dose 65+) 05/13/2019, 07/12/2020, 06/08/2021   Influenza Whole 05/30/2011, 06/17/2012   Influenza, High Dose Seasonal PF 05/28/2017, 06/19/2018   Influenza,inj,Quad PF,6+ Mos 05/28/2013, 06/10/2014, 06/17/2015, 06/22/2016   PFIZER(Purple Top)SARS-COV-2 Vaccination 10/06/2019, 10/26/2019, 07/29/2020   Pneumococcal Conjugate-13 07/20/2014   Pneumococcal Polysaccharide-23 06/19/2012   Tdap 10/20/2020    Conditions to be addressed/monitored:  allergic rhinitis, COPD, chronic diarrhea, hypothyroidism, BPH, mild hyperlipidemia.  Care Plan : General Pharmacy (Adult)  Updates made by Edythe Clarity, [facility] since 07/21/2021 12:00 AM     Problem: allergic rhinitis, COPD, chronic diarrhea, hypothyroidism, BPH, mild hyperlipidemia.   Priority: High  Onset Date: 01/05/2021     Long-Range Goal: Patient-Specific Goal   Start Date: 01/05/2021  Expected End Date: 07/07/2021  Recent Progress: On track  Priority: High  Note:   Current Barriers:  Unable to maintain control of edema Does not adhere to prescribed medication regimen  Pharmacist Clinical Goal(s):  Patient will achieve control of edema as evidenced by self monitoring adhere to prescribed medication regimen as evidenced by fill date/pill  count contact provider office for questions/concerns as evidenced notation of same in electronic health record through collaboration with PharmD and provider.   Interventions: 1:1 collaboration with Susy Frizzle, MD regarding development and update of comprehensive plan of care as evidenced by provider attestation and co-signature Inter-disciplinary care team collaboration (see longitudinal plan of care) Comprehensive medication review performed; medication list updated  in electronic medical record  Hyperlipidemia: (LDL goal < 100) -Not ideally controlled -Current treatment: None at this time -Medications previously tried: Zetia, statins -Current dietary patterns: patient does not have much of an appetite per his wife -Current exercise habits: none -Educated on Cholesterol goals;  Importance of limiting foods high in cholesterol;  -Based on age and comorbidity, would not recommend starting patient on another medication at this time.  He is unable to tolerate statins and he wished to d/c Zetia.   -Recommended continue current management strategy.  Recommend repeat lipid panel for follow up.  Update 07/20/21 Not taking any meds at this time for his cholesterol.  Last LDL was in December 2021.  Would recommend to recheck this year for monitoring purposes.  I agree with past recommendations of remaining off now due to little CV benefit and to decrease pill burden at his age.  Continues to use protein supplements for calorie intake to avoid malnutrition. Continue current management - continue routine screenings.  COPD (Goal: control symptoms and prevent exacerbations) -Controlled -Current treatment  Trelegy Ellipta 100-62.5-25mcg Albuterol HFA 49mg prn Prednisone 142mdaily -Medications previously tried: AnRomona CurlsExacerbations requiring treatment in last 6 months: none -Patient reports consistent use of maintenance inhaler -Frequency of rescue inhaler use: a few times per  day -Counseled on Proper inhaler technique; Benefits of consistent maintenance inhaler use Differences between maintenance and rescue inhalers  -He reports breathing well managed, using rescue appropriately -Recommended to continue current medication  Update 07/20/21 Back down to maintenance dose of prednisone daily.  He recently had COVID and was taking 6014maily for a short period of time.  Wife reports his breathing has been controlled.  He is still using maintenance inhaler daily.  Denies any issues with breathing lately.  However, she does mention increased confusion lately throughout the day which has gotten worse after COVID.  He will often think it is morning time when he gets up to use restroom or just sit and stare at puzzles for long periods of time.  Encouraged them to come in for appt with PCP. Continue current meds for breathing at this time.  Hypothyroidism (Goal: maintain TSH) -Controlled -Current treatment  Levothryoxine 39m53maily -Medications previously tried: none noted -Taking appropriately, TSH WNL  -Recommended to continue current medication  Swelling? (Goal: Reduce swelling) -Not ideally controlled -Current treatment  Furosemide 40mg60mly - patient only taking 20mg 37m -Medications previously tried: none noted -Reports swelling still has not resolved, they did not take the 40mg d70mdue to taking other antihistamines.  Reports edema in both feet.    -Recommended they take 40mg do39ms prescribed for a day or two to see if this resolved the issue.  If this does not resolve the swelling they are to contact office or myself.     Patient Goals/Self-Care Activities Patient will:  - take medications as prescribed focus on medication adherence by filld ate monitor swelling, contact office if it does not improve.  Follow Up Plan: The care management team will reach out to the patient again over the next 120 days.            Medication Assistance: None  required.  Patient affirms current coverage meets needs.  Patient's preferred pharmacy is:  [id number] - [id number], [place] - [phone] [place] [address] Phone: [phone] Fax: [phone]  Uses pill box? No - lines up his vials Pt endorses 100% compliance  We discussed: Benefits of medication synchronization, packaging and delivery as well as enhanced pharmacist oversight with [employer]. Patient decided to: Continue current medication management strategy  Care Plan and Follow Up Patient Decision:  Patient agrees to Care Plan and Follow-up.  Plan: The care management team will reach out to the patient again over the next 120 days.  Beverly Milch, PharmD Clinical Pharmacist [place] [phone]

## 2021-07-20 ENCOUNTER — Ambulatory Visit (INDEPENDENT_AMBULATORY_CARE_PROVIDER_SITE_OTHER): Payer: PPO | Admitting: Pharmacist

## 2021-07-20 DIAGNOSIS — J441 Chronic obstructive pulmonary disease with (acute) exacerbation: Secondary | ICD-10-CM

## 2021-07-20 DIAGNOSIS — E782 Mixed hyperlipidemia: Secondary | ICD-10-CM

## 2021-07-21 DIAGNOSIS — B0229 Other postherpetic nervous system involvement: Secondary | ICD-10-CM | POA: Diagnosis not present

## 2021-07-21 DIAGNOSIS — I700 Atherosclerosis of aorta: Secondary | ICD-10-CM | POA: Diagnosis not present

## 2021-07-21 DIAGNOSIS — Z9981 Dependence on supplemental oxygen: Secondary | ICD-10-CM | POA: Diagnosis not present

## 2021-07-21 DIAGNOSIS — E039 Hypothyroidism, unspecified: Secondary | ICD-10-CM | POA: Diagnosis not present

## 2021-07-21 DIAGNOSIS — E559 Vitamin D deficiency, unspecified: Secondary | ICD-10-CM | POA: Diagnosis not present

## 2021-07-21 DIAGNOSIS — D649 Anemia, unspecified: Secondary | ICD-10-CM | POA: Diagnosis not present

## 2021-07-21 DIAGNOSIS — R338 Other retention of urine: Secondary | ICD-10-CM | POA: Diagnosis not present

## 2021-07-21 DIAGNOSIS — K649 Unspecified hemorrhoids: Secondary | ICD-10-CM | POA: Diagnosis not present

## 2021-07-21 DIAGNOSIS — Z7982 Long term (current) use of aspirin: Secondary | ICD-10-CM | POA: Diagnosis not present

## 2021-07-21 DIAGNOSIS — Z7952 Long term (current) use of systemic steroids: Secondary | ICD-10-CM | POA: Diagnosis not present

## 2021-07-21 DIAGNOSIS — J449 Chronic obstructive pulmonary disease, unspecified: Secondary | ICD-10-CM | POA: Diagnosis not present

## 2021-07-21 DIAGNOSIS — Z8673 Personal history of transient ischemic attack (TIA), and cerebral infarction without residual deficits: Secondary | ICD-10-CM | POA: Diagnosis not present

## 2021-07-21 DIAGNOSIS — E441 Mild protein-calorie malnutrition: Secondary | ICD-10-CM | POA: Diagnosis not present

## 2021-07-21 DIAGNOSIS — K529 Noninfective gastroenteritis and colitis, unspecified: Secondary | ICD-10-CM | POA: Diagnosis not present

## 2021-07-21 DIAGNOSIS — K219 Gastro-esophageal reflux disease without esophagitis: Secondary | ICD-10-CM | POA: Diagnosis not present

## 2021-07-21 DIAGNOSIS — E782 Mixed hyperlipidemia: Secondary | ICD-10-CM | POA: Diagnosis not present

## 2021-07-21 DIAGNOSIS — L89520 Pressure ulcer of left ankle, unstageable: Secondary | ICD-10-CM | POA: Diagnosis not present

## 2021-07-21 DIAGNOSIS — J9611 Chronic respiratory failure with hypoxia: Secondary | ICD-10-CM | POA: Diagnosis not present

## 2021-07-21 DIAGNOSIS — R1310 Dysphagia, unspecified: Secondary | ICD-10-CM | POA: Diagnosis not present

## 2021-07-21 DIAGNOSIS — E871 Hypo-osmolality and hyponatremia: Secondary | ICD-10-CM | POA: Diagnosis not present

## 2021-07-21 DIAGNOSIS — G4700 Insomnia, unspecified: Secondary | ICD-10-CM | POA: Diagnosis not present

## 2021-07-21 DIAGNOSIS — Z87891 Personal history of nicotine dependence: Secondary | ICD-10-CM | POA: Diagnosis not present

## 2021-07-21 DIAGNOSIS — N401 Enlarged prostate with lower urinary tract symptoms: Secondary | ICD-10-CM | POA: Diagnosis not present

## 2021-07-21 NOTE — Patient Instructions (Addendum)
Visit Information   Goals Addressed             This Visit's Progress    Manage My Medicine   On track    Timeframe:  Long-Range Goal Priority:  High Start Date:      01/05/21                       Expected End Date:       07/07/21                Follow Up Date 04/16/21   - call for medicine refill 2 or 3 days before it runs out - keep a list of all the medicines I take; vitamins and herbals too - use an alarm clock or phone to remind me to take my medicine    Why is this important?   These steps will help you keep on track with your medicines.   Notes:        Patient Care Plan: General Pharmacy (Adult)     Problem Identified: allergic rhinitis, COPD, chronic diarrhea, hypothyroidism, BPH, mild hyperlipidemia.   Priority: High  Onset Date: 01/05/2021     Long-Range Goal: Patient-Specific Goal   Start Date: 01/05/2021  Expected End Date: 07/07/2021  Recent Progress: On track  Priority: High  Note:   Current Barriers:  Unable to maintain control of edema Does not adhere to prescribed medication regimen  Pharmacist Clinical Goal(s):  Patient will achieve control of edema as evidenced by self monitoring adhere to prescribed medication regimen as evidenced by fill date/pill count contact provider office for questions/concerns as evidenced notation of same in electronic health record through collaboration with PharmD and provider.   Interventions: 1:1 collaboration with Susy Frizzle, MD regarding development and update of comprehensive plan of care as evidenced by provider attestation and co-signature Inter-disciplinary care team collaboration (see longitudinal plan of care) Comprehensive medication review performed; medication list updated in electronic medical record  Hyperlipidemia: (LDL goal < 100) -Not ideally controlled -Current treatment: None at this time -Medications previously tried: Zetia, statins -Current dietary patterns: patient does not have  much of an appetite per his wife -Current exercise habits: none -Educated on Cholesterol goals;  Importance of limiting foods high in cholesterol;  -Based on age and comorbidity, would not recommend starting patient on another medication at this time.  He is unable to tolerate statins and he wished to d/c Zetia.   -Recommended continue current management strategy.  Recommend repeat lipid panel for follow up.  Update 07/20/21 Not taking any meds at this time for his cholesterol.  Last LDL was in December 2021.  Would recommend to recheck this year for monitoring purposes.  I agree with past recommendations of remaining off now due to little CV benefit and to decrease pill burden at his age.  Continues to use protein supplements for calorie intake to avoid malnutrition. Continue current management - continue routine screenings.  COPD (Goal: control symptoms and prevent exacerbations) -Controlled -Current treatment  Trelegy Ellipta 100-62.5-25mcg Albuterol HFA 69mcg prn Prednisone 10mg  daily -Medications previously tried: Romona Curls -Exacerbations requiring treatment in last 6 months: none -Patient reports consistent use of maintenance inhaler -Frequency of rescue inhaler use: a few times per day -Counseled on Proper inhaler technique; Benefits of consistent maintenance inhaler use Differences between maintenance and rescue inhalers  -He reports breathing well managed, using rescue appropriately -Recommended to continue current medication  Update 07/20/21 Back down to maintenance dose  of prednisone daily.  He recently had COVID and was taking 60mg  daily for a short period of time.  Wife reports his breathing has been controlled.  He is still using maintenance inhaler daily.  Denies any issues with breathing lately.  However, she does mention increased confusion lately throughout the day which has gotten worse after COVID.  He will often think it is morning time when he gets up to use  restroom or just sit and stare at puzzles for long periods of time.  Encouraged them to come in for appt with PCP. Continue current meds for breathing at this time.  Hypothyroidism (Goal: maintain TSH) -Controlled -Current treatment  Levothryoxine 106mcg daily -Medications previously tried: none noted -Taking appropriately, TSH WNL  -Recommended to continue current medication  Swelling? (Goal: Reduce swelling) -Not ideally controlled -Current treatment  Furosemide 40mg  daily - patient only taking 20mg  dose -Medications previously tried: none noted -Reports swelling still has not resolved, they did not take the 40mg  dose due to taking other antihistamines.  Reports edema in both feet.    -Recommended they take 40mg  dose as prescribed for a day or two to see if this resolved the issue.  If this does not resolve the swelling they are to contact office or myself.     Patient Goals/Self-Care Activities Patient will:  - take medications as prescribed focus on medication adherence by filld ate monitor swelling, contact office if it does not improve.  Follow Up Plan: The care management team will reach out to the patient again over the next 120 days.           Patient verbalizes understanding of instructions provided today and agrees to view in [place].  Telephone follow up appointment with pharmacy team member scheduled for: 6 months  Edythe Clarity, [place]

## 2021-07-25 DIAGNOSIS — Z515 Encounter for palliative care: Secondary | ICD-10-CM | POA: Diagnosis not present

## 2021-07-25 DIAGNOSIS — J449 Chronic obstructive pulmonary disease, unspecified: Secondary | ICD-10-CM | POA: Diagnosis not present

## 2021-07-25 DIAGNOSIS — J9611 Chronic respiratory failure with hypoxia: Secondary | ICD-10-CM | POA: Diagnosis not present

## 2021-07-26 ENCOUNTER — Telehealth: Payer: Self-pay | Admitting: *Deleted

## 2021-07-26 ENCOUNTER — Other Ambulatory Visit: Payer: Self-pay | Admitting: *Deleted

## 2021-07-26 DIAGNOSIS — R300 Dysuria: Secondary | ICD-10-CM

## 2021-07-26 DIAGNOSIS — R8290 Unspecified abnormal findings in urine: Secondary | ICD-10-CM

## 2021-07-26 NOTE — Telephone Encounter (Signed)
Received following message from front office staff: His urine has been very dark with a very strong smell for a few days; concerned it's a UTI. Patient experiencing soreness at tip of penis.   Future orders placed for UA.

## 2021-07-27 ENCOUNTER — Other Ambulatory Visit: Payer: PPO

## 2021-07-27 ENCOUNTER — Other Ambulatory Visit: Payer: Self-pay

## 2021-07-27 DIAGNOSIS — H01001 Unspecified blepharitis right upper eyelid: Secondary | ICD-10-CM | POA: Diagnosis not present

## 2021-07-27 DIAGNOSIS — H4322 Crystalline deposits in vitreous body, left eye: Secondary | ICD-10-CM | POA: Diagnosis not present

## 2021-07-27 DIAGNOSIS — R8290 Unspecified abnormal findings in urine: Secondary | ICD-10-CM | POA: Diagnosis not present

## 2021-07-27 DIAGNOSIS — H01002 Unspecified blepharitis right lower eyelid: Secondary | ICD-10-CM | POA: Diagnosis not present

## 2021-07-27 DIAGNOSIS — R300 Dysuria: Secondary | ICD-10-CM

## 2021-07-27 DIAGNOSIS — H01004 Unspecified blepharitis left upper eyelid: Secondary | ICD-10-CM | POA: Diagnosis not present

## 2021-07-27 DIAGNOSIS — H524 Presbyopia: Secondary | ICD-10-CM | POA: Diagnosis not present

## 2021-07-27 LAB — URINALYSIS, ROUTINE W REFLEX MICROSCOPIC
Bacteria, UA: NONE SEEN /HPF
Bilirubin Urine: NEGATIVE
Glucose, UA: NEGATIVE
Hyaline Cast: NONE SEEN /LPF
Ketones, ur: NEGATIVE
Leukocytes,Ua: NEGATIVE
Nitrite: NEGATIVE
Specific Gravity, Urine: 1.02 (ref 1.001–1.035)
Squamous Epithelial / HPF: NONE SEEN /HPF (ref <=5)
WBC, UA: NONE SEEN /HPF (ref 0–5)
pH: 7 (ref 5.0–8.0)

## 2021-07-27 LAB — MICROSCOPIC MESSAGE

## 2021-07-27 NOTE — Telephone Encounter (Signed)
Received call from patient wife Jaci Standard.   Inquired as to status of lab results.   PCP reviewed and states that blood is noted in urine, but does not appear to have infection. Will await results of culture.   Call placed to patient and patient wife made aware. States that patient does not have open area to penis that could have caused blood in urine, or Sx of kidney stones.   Advised to increase water intake. Will call with results of culture.

## 2021-07-28 LAB — URINE CULTURE
MICRO NUMBER:: 12620693
Result:: NO GROWTH
SPECIMEN QUALITY:: ADEQUATE

## 2021-08-01 ENCOUNTER — Ambulatory Visit: Payer: PPO | Admitting: Family Medicine

## 2021-08-03 ENCOUNTER — Ambulatory Visit (INDEPENDENT_AMBULATORY_CARE_PROVIDER_SITE_OTHER): Payer: PPO | Admitting: Family Medicine

## 2021-08-03 ENCOUNTER — Encounter: Payer: Self-pay | Admitting: Family Medicine

## 2021-08-03 ENCOUNTER — Other Ambulatory Visit: Payer: Self-pay

## 2021-08-03 DIAGNOSIS — R410 Disorientation, unspecified: Secondary | ICD-10-CM

## 2021-08-03 DIAGNOSIS — F01A11 Vascular dementia, mild, with agitation: Secondary | ICD-10-CM | POA: Diagnosis not present

## 2021-08-03 DIAGNOSIS — U071 COVID-19: Secondary | ICD-10-CM | POA: Diagnosis not present

## 2021-08-03 MED ORDER — DONEPEZIL HCL 5 MG PO TABS: 5.0000 mg | ORAL_TABLET | Freq: Every day | ORAL | 11 refills | Status: DC

## 2021-08-03 NOTE — Progress Notes (Signed)
Subjective:    Patient ID: John Black, male    DOB: 03-15-1929, 87 y.o.   [name]: [id number]  HPI Patient's wife came to the office today to discuss with me personally her husband's decline.  She states that he has been having some mild memory issues over the last year.  In December 2021 he had an MRI that showed remote strokes and therefore I feel that he likely has some age-related decline secondary to vascular dementia.  However over the last 2 to 3 weeks, the patient's decline has been more precipitous.  He was diagnosed with COVID.  Ever since that time he has been somewhat delirious.  For instance, he got up took off his clothes and sat down on the couch and urinated confused and thinking that he was in the bathroom.  He will call out for help and then when people go to see what is wrong, the patient is fine.  He is just disoriented.  She is having to more frequently answer his questions and he repeats himself.  He is also having word finding difficulties.  Both she and her daughter are concerned that he may have had a stroke while he had COVID.  He recently had a chest x-ray which showed possible opacity in the right lung however this is a chronic finding and he had already been treated with antibiotics.  He then also brought in a urine sample that was pristine and showed no evidence of a urinary tract infection.  Therefore I feel that we have ruled out delirium due to anything other than his recent COVID diagnosis or potentially a stroke. Past Medical History:  Diagnosis Date   Allergic rhinitis    Cataract    s/p removal   Chronic respiratory failure (HCC)    oxygen 3L at home   Emphysema    Hypothyroid    PNA (pneumonia)    Prostate cancer [facility])    Past Surgical History:  Procedure Laterality Date   ADENOIDECTOMY     HIP ARTHROPLASTY Right 10/10/2020   Procedure: ARTHROPLASTY BIPOLAR HIP (HEMIARTHROPLASTY);  Surgeon: Mordecai Rasmussen, MD;  Location: [facility];  Service: Orthopedics;   Laterality: Right;   ROTATOR CUFF REPAIR  1990,2009   bilateral   Seed implant for prostate cancer  2000   TONSILLECTOMY     TRANSURETHRAL RESECTION OF PROSTATE  2001   x2   Current Outpatient Medications on File Prior to Visit  Medication Sig Dispense Refill   acetaminophen (TYLENOL) 500 MG tablet Take 500 mg by mouth every 6 (six) hours as needed.     acetaminophen (TYLENOL) 650 MG CR tablet Take 650 mg by mouth 3 (three) times daily.     albuterol (PROVENTIL) (2.5 MG/3ML) 0.083% nebulizer solution UE 1 VIAL IN NEBULIZER EVERY 6 HOURS AS NEEDED FOR WHEEZING/SOB 150 mL 12   Albuterol Sulfate (PROAIR RESPICLICK) 585 (90 Base) MCG/ACT AEPB Inhale 2 puffs into the lungs every 4 (four) hours as needed.     aspirin EC 81 MG tablet Take 81 mg by mouth. Twice a day from 10/12/2020-11/22/2020 Then take once a day starting 11/23/2020     cholecalciferol (VITAMIN D3) 25 MCG (1000 UNIT) tablet Take 1,000 Units by mouth daily.     diphenhydrAMINE HCl, Sleep, (SLEEPING PO) Take 2 tablets by mouth at bedtime.     diphenoxylate-atropine (LOMOTIL) 2.5-0.025 MG tablet TAKE 1 TABLET BY MOUTH 4 (FOUR) TIMES DAILY AS NEEDED FOR DIARRHEA OR LOOSE STOOLS 30 tablet 0  Ensure (ENSURE) Take 237 mLs by mouth daily.     Fluticasone-Umeclidin-Vilant (TRELEGY ELLIPTA) 100-62.5-25 MCG/INH AEPB Inhale 1 puff into the lungs daily. 60 each 11   Fluticasone-Umeclidin-Vilant (TRELEGY ELLIPTA) 100-62.5-25 MCG/INH AEPB Inhale 1 puff into the lungs daily. 28 each 0   furosemide (LASIX) 40 MG tablet TAKE 1 TABLET BY MOUTH EVERY DAY 90 tablet 1   guaiFENesin (MUCINEX) 600 MG 12 hr tablet Take 600 mg by mouth 2 (two) times daily as needed for cough.     levothyroxine (SYNTHROID) 75 MCG tablet TAKE 1 TABLET (75 MCG TOTAL) BY MOUTH DAILY. 90 tablet 3   LORazepam (ATIVAN) 0.5 MG tablet Take 1 tablet (0.5 mg total) by mouth 2 (two) times daily as needed for anxiety. 30 tablet 0   Multiple Vitamin (MULTIVITAMIN) tablet Take 1 tablet by  mouth daily.     NON FORMULARY Diet: _____ Regular, ___x___ NAS, _______Consistent Carbohydrate, _______NPO _____Other     OVER THE COUNTER MEDICATION Take 1 tablet by mouth 2 (two) times daily. Slippery elm     OXYGEN Inhale 4 L into the lungs continuous.     oxymetazoline (AFRIN) 0.05 % nasal spray Place 1 spray into both nostrils 2 (two) times daily.     polyvinyl alcohol (LIQUIFILM TEARS) 1.4 % ophthalmic solution Place 1 drop into both eyes 3 (three) times daily as needed for dry eyes.     predniSONE (DELTASONE) 10 MG tablet Take 10 mg by mouth daily.     tamsulosin (FLOMAX) 0.4 MG CAPS capsule TAKE 1 CAPSULE BY MOUTH EVERY DAY 90 capsule 3   terbinafine (LAMISIL) 1 % cream Apply 1 application topically 2 (two) times daily. 30 g 1   No current facility-administered medications on file prior to visit.   Allergies  Allergen Reactions   Morphine Other (See Comments)    REACTION: sweats   Social History   Socioeconomic History   Marital status: Married    Spouse name: Not on file   Number of children: Not on file   Years of education: Not on file   Highest education level: Not on file  Occupational History   Occupation: Chief Financial Officer  Tobacco Use   Smoking status: Former    Packs/day: 1.50    Years: 50.00    Pack years: 75.00    Types: Cigarettes    Quit date: 09/17/2008    Years since quitting: 12.8   Smokeless tobacco: Never  Vaping Use   Vaping Use: Never used  Substance and Sexual Activity   Alcohol use: Yes    Alcohol/week: 1.0 standard drink    Types: 1 Glasses of wine per week    Comment: each evening   Drug use: No   Sexual activity: Not on file  Other Topics Concern   Not on file  Social History Narrative   Not on file   Social Determinants of Health   Financial Resource Strain: Not on file  Food Insecurity: Not on file  Transportation Needs: Not on file  Physical Activity: Not on file  Stress: Not on file  Social Connections: Not on file  Intimate  Partner Violence: Not on file      Review of Systems  All other systems reviewed and are negative.     Objective:   Physical Exam        Assessment & Plan:   Mild vascular dementia with agitation  Delirium  COVID-19 No physical exam could be performed today because the patient was not present.  He is a 87 year old gentleman with end-stage COPD that is O2 dependent.  He has age-related cognitive decline and I feel likely some vascular dementia.  Ever since he was diagnosed with COVID-19 a few weeks ago he has been dealing with waxing and waning levels of consciousness and confusion with different examples of confusion listed above in the history of present illness.  I believe that this is delirium likely due to the COVID coupled with perhaps an ischemic insult to the brain.  We discussed repeating an MRI.  However, ultimately, the family and I decided to start the patient on Aricept 5 mg a day and then reassess in 1 month

## 2021-08-04 ENCOUNTER — Telehealth (INDEPENDENT_AMBULATORY_CARE_PROVIDER_SITE_OTHER): Payer: PPO | Admitting: Nurse Practitioner

## 2021-08-04 DIAGNOSIS — H01006 Unspecified blepharitis left eye, unspecified eyelid: Secondary | ICD-10-CM

## 2021-08-04 DIAGNOSIS — H01003 Unspecified blepharitis right eye, unspecified eyelid: Secondary | ICD-10-CM

## 2021-08-04 DIAGNOSIS — R410 Disorientation, unspecified: Secondary | ICD-10-CM | POA: Diagnosis not present

## 2021-08-04 DIAGNOSIS — F01A11 Vascular dementia, mild, with agitation: Secondary | ICD-10-CM

## 2021-08-04 MED ORDER — ERYTHROMYCIN 5 MG/GM OP OINT: 1.0000 "application " | TOPICAL_OINTMENT | Freq: Three times a day (TID) | OPHTHALMIC | 0 refills | Status: DC

## 2021-08-04 MED ORDER — LORAZEPAM 0.5 MG PO TABS: 0.5000 mg | ORAL_TABLET | Freq: Two times a day (BID) | ORAL | 0 refills | Status: DC | PRN

## 2021-08-04 NOTE — Progress Notes (Signed)
Subjective:    Patient ID: John Black, male    DOB: Jan 27, 1929, 87 y.o.   [name]: [id number]  HPI: DEVLON Black is a 87 y.o. male presenting virtually with daughter, John Black, for eye complaint.  Chief Complaint  Patient presents with   Eye Drainage   Medication Refill   EYE PAIN Eyes started bothering him yesterday afternoon - daughter reports they are goopy/watery.  She has noticed drainage in the corners and they are matted.  She thinks they are painful. Duration:  days Involved eye:  bilateral Onset: sudden Severity:   Quality: Foreign body sensation:no Visual impairment: no Eye redness: yes Discharge: yes Crusting or matting of eyelids: yes Swelling: no Photophobia: no Itching: no Tearing: yes Headache: no Floaters: no URI symptoms: no Contact lens use: no Close contacts with similar problems: no Eye trauma: no  Daughter also asking for refill of lorazepam.  She reports he is almost out of the medication.  He uses as needed for anxiety, sounds like he has had some worsening delirium recently.    Allergies  Allergen Reactions   Morphine Other (See Comments)    REACTION: sweats    Outpatient Encounter Medications as of 08/04/2021  Medication Sig   acetaminophen (TYLENOL) 500 MG tablet Take 500 mg by mouth every 6 (six) hours as needed.   acetaminophen (TYLENOL) 650 MG CR tablet Take 650 mg by mouth 3 (three) times daily.   albuterol (PROVENTIL) (2.5 MG/3ML) 0.083% nebulizer solution UE 1 VIAL IN NEBULIZER EVERY 6 HOURS AS NEEDED FOR WHEEZING/SOB   Albuterol Sulfate (PROAIR RESPICLICK) 539 (90 Base) MCG/ACT AEPB Inhale 2 puffs into the lungs every 4 (four) hours as needed.   aspirin EC 81 MG tablet Take 81 mg by mouth. Twice a day from 10/12/2020-11/22/2020 Then take once a day starting 11/23/2020   cholecalciferol (VITAMIN D3) 25 MCG (1000 UNIT) tablet Take 1,000 Units by mouth daily.   diphenhydrAMINE HCl, Sleep, (SLEEPING PO) Take 2 tablets by mouth at  bedtime.   diphenoxylate-atropine (LOMOTIL) 2.5-0.025 MG tablet TAKE 1 TABLET BY MOUTH 4 (FOUR) TIMES DAILY AS NEEDED FOR DIARRHEA OR LOOSE STOOLS   donepezil (ARICEPT) 5 MG tablet Take 1 tablet (5 mg total) by mouth at bedtime.   Ensure (ENSURE) Take 237 mLs by mouth daily.   erythromycin ophthalmic ointment Place 1 application into both eyes 3 (three) times daily. For 1 week   Fluticasone-Umeclidin-Vilant (TRELEGY ELLIPTA) 100-62.5-25 MCG/INH AEPB Inhale 1 puff into the lungs daily.   Fluticasone-Umeclidin-Vilant (TRELEGY ELLIPTA) 100-62.5-25 MCG/INH AEPB Inhale 1 puff into the lungs daily.   furosemide (LASIX) 40 MG tablet TAKE 1 TABLET BY MOUTH EVERY DAY   guaiFENesin (MUCINEX) 600 MG 12 hr tablet Take 600 mg by mouth 2 (two) times daily as needed for cough.   levothyroxine (SYNTHROID) 75 MCG tablet TAKE 1 TABLET (75 MCG TOTAL) BY MOUTH DAILY.   LORazepam (ATIVAN) 0.5 MG tablet Take 1 tablet (0.5 mg total) by mouth 2 (two) times daily as needed for anxiety.   Multiple Vitamin (MULTIVITAMIN) tablet Take 1 tablet by mouth daily.   NON FORMULARY Diet: _____ Regular, ___x___ NAS, _______Consistent Carbohydrate, _______NPO _____Other   OVER THE COUNTER MEDICATION Take 1 tablet by mouth 2 (two) times daily. Slippery elm   OXYGEN Inhale 4 L into the lungs continuous.   oxymetazoline (AFRIN) 0.05 % nasal spray Place 1 spray into both nostrils 2 (two) times daily.   polyvinyl alcohol (LIQUIFILM TEARS) 1.4 % ophthalmic solution Place  1 drop into both eyes 3 (three) times daily as needed for dry eyes.   predniSONE (DELTASONE) 10 MG tablet Take 10 mg by mouth daily.   tamsulosin (FLOMAX) 0.4 MG CAPS capsule TAKE 1 CAPSULE BY MOUTH EVERY DAY   terbinafine (LAMISIL) 1 % cream Apply 1 application topically 2 (two) times daily.   [DISCONTINUED] LORazepam (ATIVAN) 0.5 MG tablet Take 1 tablet (0.5 mg total) by mouth 2 (two) times daily as needed for anxiety.   No facility-administered encounter  medications on file as of 08/04/2021.    Patient Active Problem List   Diagnosis Date Noted   Bilateral rotator cuff syndrome 11/07/2020   Syncope and collapse 10/20/2020   Hyperglycemia 10/19/2020   Aortic atherosclerosis ([place]) 10/15/2020   GERD without esophagitis 10/15/2020   Acute blood loss as cause of postoperative anemia 10/15/2020   Vitamin D deficiency 10/15/2020   Closed fracture of right hip ([place]) 10/09/2020   Dysphagia 07/24/2019   Mild protein-calorie malnutrition ([place]) 02/24/2019   Gait instability 01/27/2019   Right inguinal hernia    Chronic diarrhea 12/06/2017   Inguinal hernia 03/02/2014   Constipation 03/02/2014   Chronic respiratory failure with hypoxia ([place]) 12/09/2013   Hemorrhoids 09/28/2013   Hyperlipidemia 06/28/2013   Sinusitis, chronic 11/28/2012   COPD with acute exacerbation ([place]) 11/10/2012   BPH (benign prostatic hyperplasia) 10/22/2012   Insomnia 10/22/2012   HCAP (healthcare-associated pneumonia) 09/05/2012   Hypothyroidism 06/19/2012   Atypical nevi 06/19/2012   Seborrheic keratosis 06/19/2012   History of prostate cancer 11/07/2010   Allergic rhinitis 11/07/2010   COPD GOLD 3/ group D  02 dep 11/07/2010    Past Medical History:  Diagnosis Date   Allergic rhinitis    Cataract    s/p removal   Chronic respiratory failure (HCC)    oxygen 3L at home   Emphysema    Hypothyroid    PNA (pneumonia)    Prostate cancer ([place])     Relevant past medical, surgical, family and social history reviewed and updated as indicated. Interim medical history since our last visit reviewed.  Review of Systems Per HPI unless specifically indicated above     Objective:    SpO2 92%   Wt Readings from Last 3 Encounters:  07/13/21 118 lb 12.8 oz (53.9 kg)  07/06/21 121 lb 6.4 oz (55.1 kg)  06/08/21 122 lb (55.3 kg)    Physical Exam Nursing note reviewed.  Constitutional:      General: He is not in acute distress.    Appearance: Normal appearance.  He is not toxic-appearing.  Eyes:     General: No scleral icterus.       Right eye: Discharge present.        Left eye: Discharge present.    Conjunctiva/sclera:     Right eye: Right conjunctiva is injected. Exudate present.     Left eye: Exudate present.     Comments: Unable to perform full visual examination due to virtual visit and cognitive deficit  Skin:    Coloration: Skin is pale. Skin is not jaundiced.     Findings: No erythema.  Neurological:     Mental Status: Mental status is at baseline.      Assessment & Plan:  1. Mild vascular dementia with agitation Recently started Aricept with PCP.  PDMP reviewed and appropriately consistent with increased use secondary to delirium.  From my understanding, this is a chronic medication for the patient.  I re-educated the daughter that this medication is  not to be used every day and she understands this.  Refill given today as PCP is out of the office.    2. Delirium Recently started Aricept with PCP.  PDMP reviewed and appropriately consistent with increased use secondary to delirium.  From my understanding, this is a chronic medication for the patient.  I re-educated the daughter that this medication is not to be used every day and she understands this.  Refill given today as PCP is out of the office.    3. Blepharitis of both eyes, unspecified eyelid, unspecified type Acute, ongoing.  Suspect blepharitis, however differential remains wide.  Difficult to perform examination of eyes via virtual visit.  Daughter expresses difficulty transporting patient to the office.  Treat empirically with erythromycin ointment x 7 days.  Return to clinic if symptoms do not improve after treatment.  Follow up plan: Return if symptoms worsen or fail to improve.  Due to the catastrophic nature of the COVID-19 pandemic, this video visit was completed soley via audio and visual contact via [facility] due to the restrictions of the COVID-19 pandemic.  All  issues as above were discussed and addressed. Physical exam was done as above through visual confirmation on [employer]. If it was felt that the patient should be evaluated in the office, they were directed there. The patient verbally consented to this visit. Location of the patient: home Location of the provider: work Those involved with this call:  Provider: Noemi Chapel, DNP, FNP-C CMA: n/a Front Desk/Registration: Vevelyn Pat  Time spent on call:  7 minutes with patient face to face via video conference. More than 50% of this time was spent in counseling and coordination of care. 12 minutes total spent in review of patient's record and preparation of their chart. I verified patient identity using two factors (patient name and date of birth). Patient consents verbally to being seen via telemedicine visit today.

## 2021-08-08 DIAGNOSIS — J9611 Chronic respiratory failure with hypoxia: Secondary | ICD-10-CM | POA: Diagnosis not present

## 2021-08-08 DIAGNOSIS — L89522 Pressure ulcer of left ankle, stage 2: Secondary | ICD-10-CM | POA: Diagnosis not present

## 2021-08-08 DIAGNOSIS — Z515 Encounter for palliative care: Secondary | ICD-10-CM | POA: Diagnosis not present

## 2021-08-08 DIAGNOSIS — J449 Chronic obstructive pulmonary disease, unspecified: Secondary | ICD-10-CM | POA: Diagnosis not present

## 2021-08-09 DIAGNOSIS — L89522 Pressure ulcer of left ankle, stage 2: Secondary | ICD-10-CM | POA: Diagnosis not present

## 2021-08-16 DIAGNOSIS — E782 Mixed hyperlipidemia: Secondary | ICD-10-CM

## 2021-08-16 DIAGNOSIS — J441 Chronic obstructive pulmonary disease with (acute) exacerbation: Secondary | ICD-10-CM | POA: Diagnosis not present

## 2021-08-22 ENCOUNTER — Telehealth: Payer: Self-pay

## 2021-08-22 DIAGNOSIS — R531 Weakness: Secondary | ICD-10-CM | POA: Diagnosis not present

## 2021-08-22 DIAGNOSIS — J9611 Chronic respiratory failure with hypoxia: Secondary | ICD-10-CM | POA: Diagnosis not present

## 2021-08-22 DIAGNOSIS — J449 Chronic obstructive pulmonary disease, unspecified: Secondary | ICD-10-CM | POA: Diagnosis not present

## 2021-08-22 DIAGNOSIS — R0981 Nasal congestion: Secondary | ICD-10-CM | POA: Diagnosis not present

## 2021-08-22 DIAGNOSIS — Z515 Encounter for palliative care: Secondary | ICD-10-CM | POA: Diagnosis not present

## 2021-08-22 NOTE — Telephone Encounter (Signed)
Kim/Hospice called and states pt is still very weak and is having difficulty with transferring himself. Family is asking for [facility] eval for possible PT.   Please advise, thanks!

## 2021-08-24 ENCOUNTER — Other Ambulatory Visit: Payer: Self-pay | Admitting: Family Medicine

## 2021-08-24 NOTE — Telephone Encounter (Signed)
Spoke with Maudie Mercury and advised referral has been placed. Nothing further needed.

## 2021-08-24 NOTE — Addendum Note (Signed)
Addended by: Jenna Luo T on: 08/24/2021 06:45 AM   Modules accepted: Orders

## 2021-08-25 ENCOUNTER — Telehealth: Payer: Self-pay | Admitting: Internal Medicine

## 2021-08-25 ENCOUNTER — Other Ambulatory Visit: Payer: Self-pay | Admitting: Nurse Practitioner

## 2021-08-25 MED ORDER — TRELEGY ELLIPTA 100-62.5-25 MCG/ACT IN AEPB: 1.0000 | INHALATION_SPRAY | Freq: Every day | RESPIRATORY_TRACT | 0 refills | Status: DC

## 2021-08-25 NOTE — Telephone Encounter (Signed)
Patient's wife came into the office asking for samples due to the patient being in the donut hole. Samples provided. She stated that patient will need a new RX for his Trelegy asked her to call the office once the new year starts and patient is out of the donut hole. She expressed understanding. Nothing further needed at this time.

## 2021-08-29 ENCOUNTER — Other Ambulatory Visit: Payer: Self-pay | Admitting: Internal Medicine

## 2021-08-31 ENCOUNTER — Telehealth: Payer: Self-pay

## 2021-08-31 ENCOUNTER — Other Ambulatory Visit: Payer: Self-pay | Admitting: Family Medicine

## 2021-08-31 DIAGNOSIS — J9611 Chronic respiratory failure with hypoxia: Secondary | ICD-10-CM | POA: Diagnosis not present

## 2021-08-31 DIAGNOSIS — J449 Chronic obstructive pulmonary disease, unspecified: Secondary | ICD-10-CM | POA: Diagnosis not present

## 2021-08-31 DIAGNOSIS — Z515 Encounter for palliative care: Secondary | ICD-10-CM | POA: Diagnosis not present

## 2021-08-31 MED ORDER — AMOXICILLIN-POT CLAVULANATE 875-125 MG PO TABS: 1.0000 | ORAL_TABLET | Freq: Two times a day (BID) | ORAL | 0 refills | Status: DC

## 2021-08-31 NOTE — Telephone Encounter (Signed)
Joelene Millin w/Hospice went to see pt today at family's request. They had reported increased cough. Kim evaluated pt and does report coarse breathing sounds in the R middle and lower lobes. Left is clear. Family reports pt seems to be choking on his food and they are concerned about possible aspiration. Kim and family deny fevers/chills, she does report pt has been panting more than normal.   Maudie Mercury is asking for your recommendations - abtx or cxr  Please advise, thanks!

## 2021-08-31 NOTE — Telephone Encounter (Signed)
LMVM to advise of rx

## 2021-09-01 ENCOUNTER — Other Ambulatory Visit: Payer: Self-pay

## 2021-09-01 DIAGNOSIS — R319 Hematuria, unspecified: Secondary | ICD-10-CM

## 2021-09-04 ENCOUNTER — Other Ambulatory Visit: Payer: Self-pay | Admitting: Internal Medicine

## 2021-09-06 DIAGNOSIS — J9611 Chronic respiratory failure with hypoxia: Secondary | ICD-10-CM | POA: Diagnosis not present

## 2021-09-06 DIAGNOSIS — J189 Pneumonia, unspecified organism: Secondary | ICD-10-CM | POA: Diagnosis not present

## 2021-09-06 DIAGNOSIS — J449 Chronic obstructive pulmonary disease, unspecified: Secondary | ICD-10-CM | POA: Diagnosis not present

## 2021-09-06 DIAGNOSIS — Z515 Encounter for palliative care: Secondary | ICD-10-CM | POA: Diagnosis not present

## 2021-09-06 DIAGNOSIS — L97329 Non-pressure chronic ulcer of left ankle with unspecified severity: Secondary | ICD-10-CM | POA: Diagnosis not present

## 2021-09-07 ENCOUNTER — Other Ambulatory Visit: Payer: Self-pay | Admitting: Adult Health

## 2021-09-07 ENCOUNTER — Other Ambulatory Visit: Payer: Self-pay | Admitting: Family Medicine

## 2021-09-12 DIAGNOSIS — L89522 Pressure ulcer of left ankle, stage 2: Secondary | ICD-10-CM | POA: Diagnosis not present

## 2021-09-14 ENCOUNTER — Other Ambulatory Visit: Payer: Self-pay | Admitting: Family Medicine

## 2021-09-14 MED ORDER — LORAZEPAM 0.5 MG PO TABS: 0.5000 mg | ORAL_TABLET | Freq: Two times a day (BID) | ORAL | 0 refills | Status: DC | PRN

## 2021-09-15 NOTE — Telephone Encounter (Signed)
This encounter was created in error - please disregard.

## 2021-09-22 ENCOUNTER — Ambulatory Visit (INDEPENDENT_AMBULATORY_CARE_PROVIDER_SITE_OTHER): Payer: PPO | Admitting: Nurse Practitioner

## 2021-09-22 ENCOUNTER — Other Ambulatory Visit: Payer: Self-pay

## 2021-09-22 ENCOUNTER — Ambulatory Visit (HOSPITAL_COMMUNITY)
Admission: RE | Admit: 2021-09-22 | Discharge: 2021-09-22 | Disposition: A | Discharge status: Home / Self Care | Payer: PPO | Source: Ambulatory Visit | Attending: Nurse Practitioner | Admitting: Nurse Practitioner

## 2021-09-22 ENCOUNTER — Encounter: Payer: Self-pay | Admitting: Nurse Practitioner

## 2021-09-22 ENCOUNTER — Telehealth: Payer: Self-pay

## 2021-09-22 VITALS — BP 110/62 | HR 65 | Temp 98.5°F | Resp 16 | Ht 65.0 in | Wt 112.9 lb

## 2021-09-22 DIAGNOSIS — R051 Acute cough: Secondary | ICD-10-CM | POA: Insufficient documentation

## 2021-09-22 DIAGNOSIS — R059 Cough, unspecified: Secondary | ICD-10-CM | POA: Diagnosis not present

## 2021-09-22 IMAGING — DX DG CHEST 2V
2 series · 2 of 2 positions shown · non-contrast
Comparison: [DATE].

CLINICAL DATA: Cough.

EXAM:
CHEST - 2 VIEW

[chest lat]
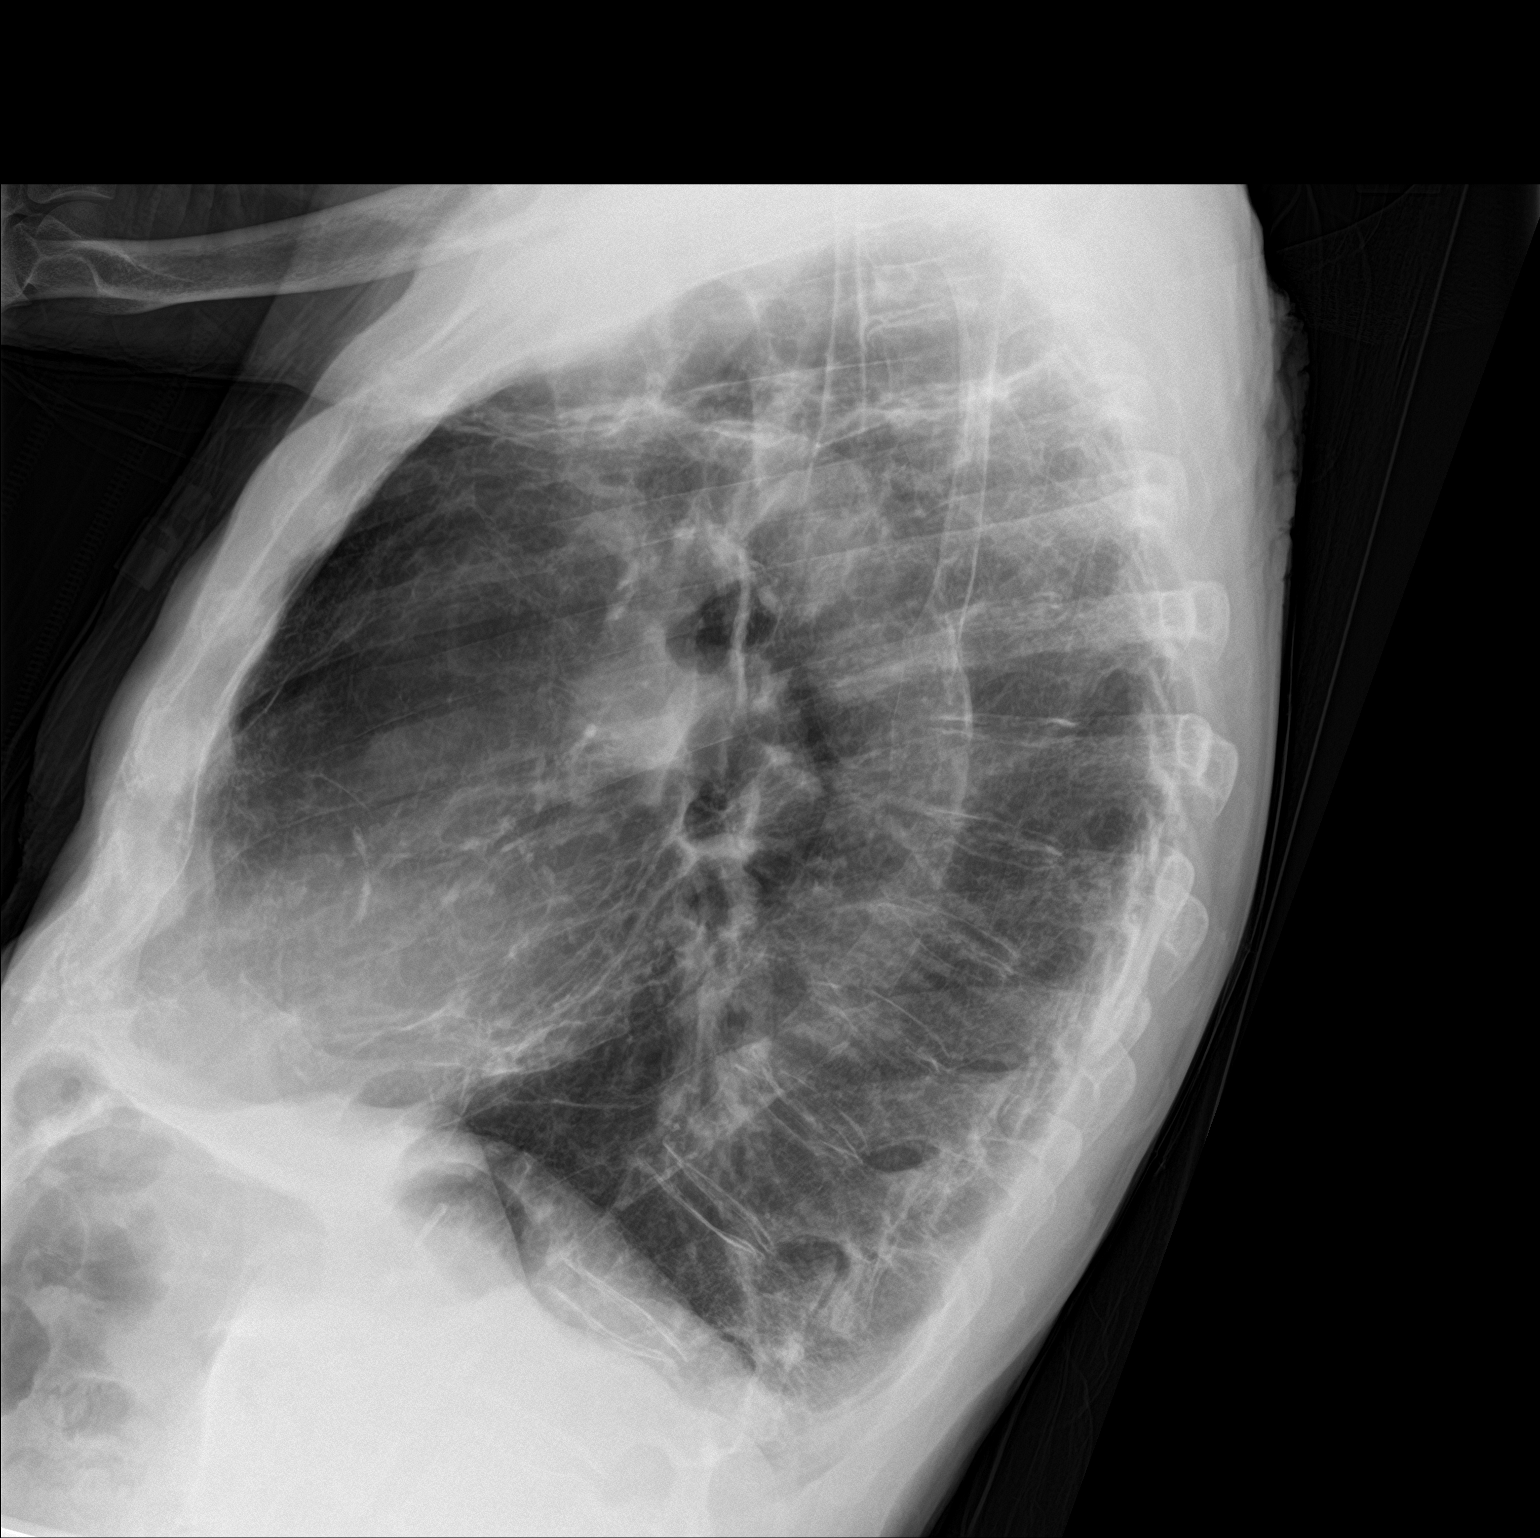

[chest ap]
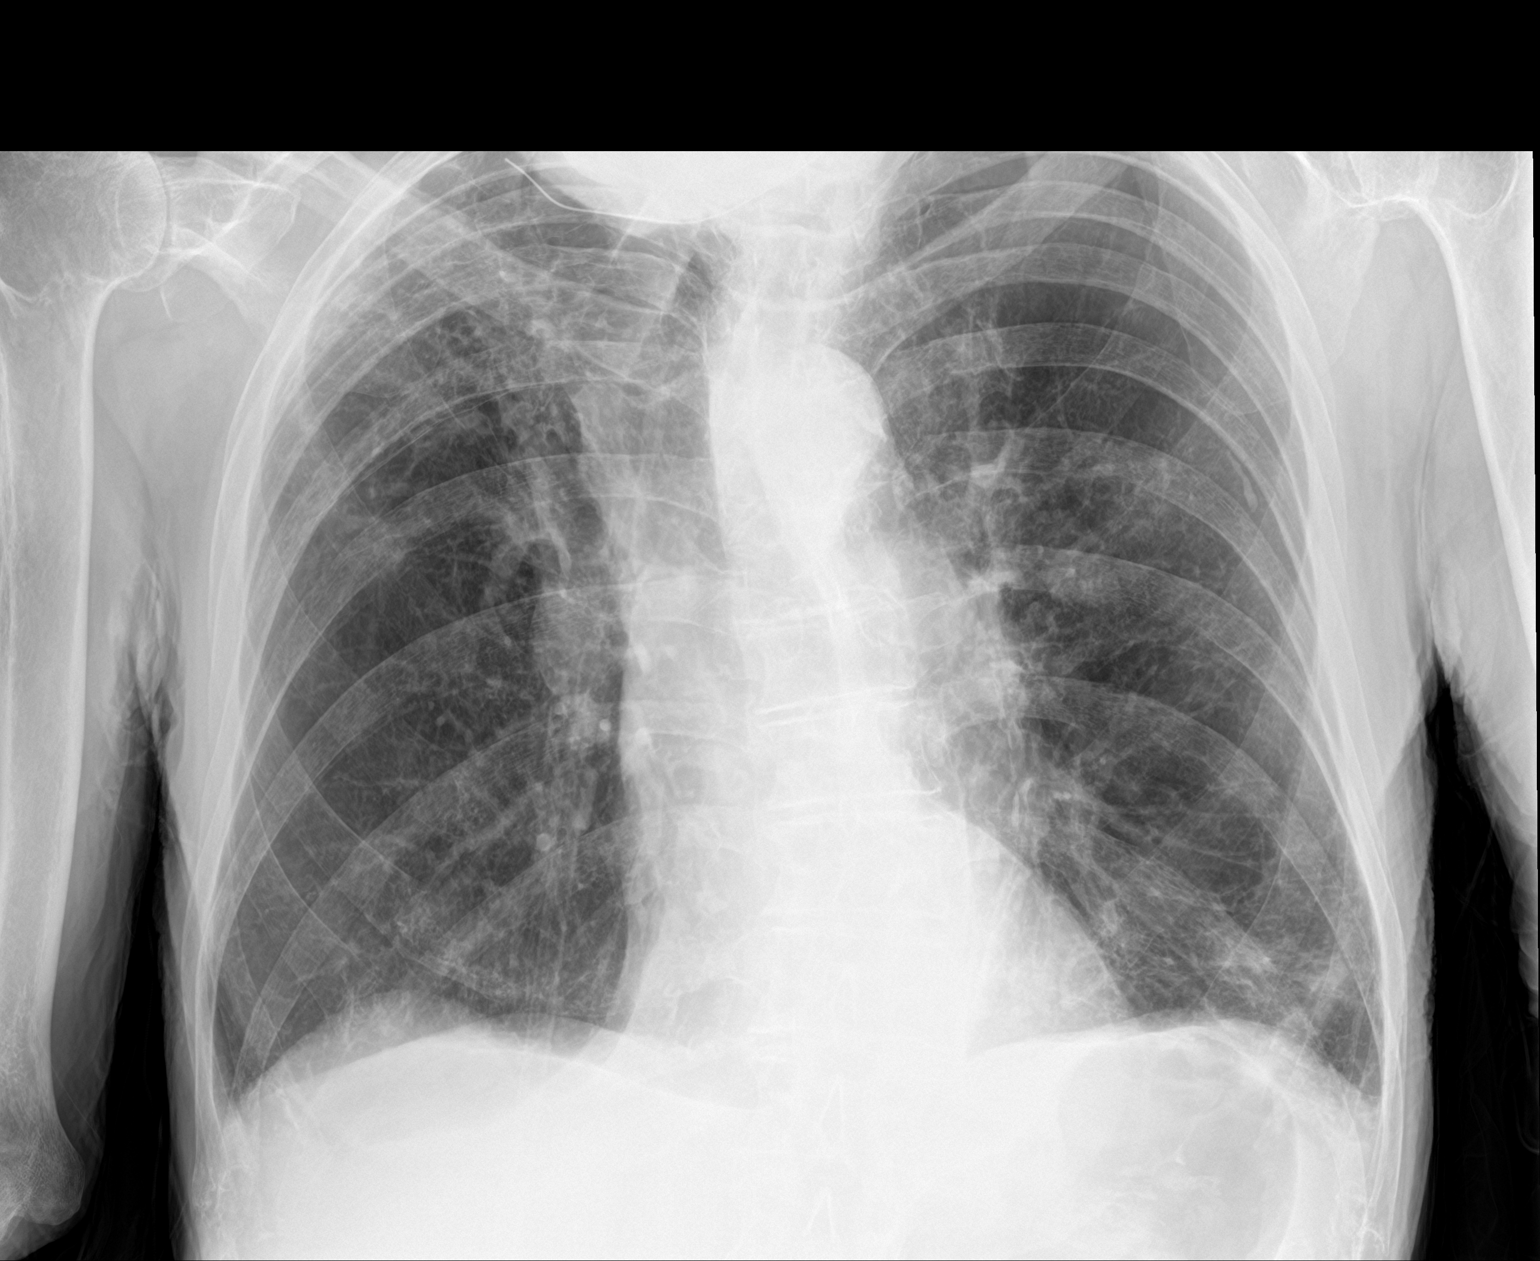

[2 of 2 positions shown; findings below may reference images not displayed]

FINDINGS: The heart size and mediastinal contours are within normal limits.
Stable right upper lobe scarring is noted. Left basilar scarring is
noted as well. No definite acute abnormality is noted. The
visualized skeletal structures are unremarkable.
IMPRESSION: Stable chronic findings as described above. No definite acute
abnormality.

## 2021-09-22 NOTE — Telephone Encounter (Signed)
-----   Message from Eulogio Bear, NP sent at 09/22/2021  4:42 PM EST ----- Please notify that chest x-ray does not show any acute findings suggestive of pneumonia.  Continue with the plan of care as discussed earlier today - deep breathing, Mucinex, increase frequency of nebulizer.  Follow up if symptoms do not improve.

## 2021-09-22 NOTE — Telephone Encounter (Signed)
Informed patient of results and recommendations. 

## 2021-09-22 NOTE — Progress Notes (Signed)
Subjective:    Patient ID: John Black, male    DOB: September 14, 1929, 88 y.o.   [name]: [id number]  HPI: John Black is a 88 y.o. male presenting for concerns with breathing.   Chief Complaint  Patient presents with   Office Visit    Wheezing  Using neb 2-3 times a day On 4 liters of O2 24 hrs  Was on antibiotics for 10 days  Runny nose   COUGH Started Augmentin about 3 weeks ago due to possible aspiration pneumonia.  Patient was taking Augmentin for 10 days and after completion wife reports symptoms returned 2 days later.  Patient has end stage COPD and wears oxygen at home at his baseline.  Duration: weeks Onset: gradual Related to exertion: yes Cough: yes; thick and discolored Chest tightness: yes Wheezing: yes Fevers: no Chest pain: no Palpitations: no Nausea: no Diaphoresis: no Deconditioning: yes Status: stable  PRESSURE ULCER Duration:  months  Location: left lateral ankle  Itching: no Burning: no Redness: yes Oozing: no Scaling: no Blisters: no Painful: no Fevers: no Change in detergents/soaps/personal care products: no Recent illness: no Recent travel:no History of same: yes Context: stable Alleviating factors: nothing Treatments attempted: Santyl  Allergies  Allergen Reactions   Morphine Other (See Comments)    REACTION: sweats    Outpatient Encounter Medications as of 09/22/2021  Medication Sig   acetaminophen (TYLENOL) 500 MG tablet Take 500 mg by mouth every 6 (six) hours as needed.   acetaminophen (TYLENOL) 650 MG CR tablet Take 650 mg by mouth 3 (three) times daily.   albuterol (PROVENTIL) (2.5 MG/3ML) 0.083% nebulizer solution UE 1 VIAL IN NEBULIZER EVERY 6 HOURS AS NEEDED FOR WHEEZING/SOB   Albuterol Sulfate (PROAIR RESPICLICK) 128 (90 Base) MCG/ACT AEPB Inhale 2 puffs into the lungs every 4 (four) hours as needed.   amoxicillin-clavulanate (AUGMENTIN) 875-125 MG tablet Take 1 tablet by mouth 2 (two) times daily.   aspirin EC  81 MG tablet Take 81 mg by mouth. Twice a day from 10/12/2020-11/22/2020 Then take once a day starting 11/23/2020   cholecalciferol (VITAMIN D3) 25 MCG (1000 UNIT) tablet Take 1,000 Units by mouth daily.   diphenhydrAMINE HCl, Sleep, (SLEEPING PO) Take 2 tablets by mouth at bedtime.   diphenoxylate-atropine (LOMOTIL) 2.5-0.025 MG tablet TAKE 1 TABLET BY MOUTH 4 (FOUR) TIMES DAILY AS NEEDED FOR DIARRHEA OR LOOSE STOOLS   donepezil (ARICEPT) 5 MG tablet Take 1 tablet (5 mg total) by mouth at bedtime.   Ensure (ENSURE) Take 237 mLs by mouth daily.   erythromycin ophthalmic ointment Place 1 application into both eyes 3 (three) times daily. For 1 week   Fluticasone-Umeclidin-Vilant (TRELEGY ELLIPTA) 100-62.5-25 MCG/ACT AEPB Inhale 1 puff into the lungs daily.   Fluticasone-Umeclidin-Vilant (TRELEGY ELLIPTA) 100-62.5-25 MCG/INH AEPB Inhale 1 puff into the lungs daily.   furosemide (LASIX) 40 MG tablet TAKE 1 TABLET BY MOUTH EVERY DAY   guaiFENesin (MUCINEX) 600 MG 12 hr tablet Take 600 mg by mouth 2 (two) times daily as needed for cough.   levothyroxine (SYNTHROID) 75 MCG tablet TAKE 1 TABLET (75 MCG TOTAL) BY MOUTH DAILY.   LORazepam (ATIVAN) 0.5 MG tablet Take 1 tablet (0.5 mg total) by mouth 2 (two) times daily as needed for anxiety.   Multiple Vitamin (MULTIVITAMIN) tablet Take 1 tablet by mouth daily.   NON FORMULARY Diet: _____ Regular, ___x___ NAS, _______Consistent Carbohydrate, _______NPO _____Other   OVER THE COUNTER MEDICATION Take 1 tablet by mouth 2 (two) times daily.  Slippery elm   OXYGEN Inhale 4 L into the lungs continuous.   oxymetazoline (AFRIN) 0.05 % nasal spray Place 1 spray into both nostrils 2 (two) times daily.   polyvinyl alcohol (LIQUIFILM TEARS) 1.4 % ophthalmic solution Place 1 drop into both eyes 3 (three) times daily as needed for dry eyes.   predniSONE (DELTASONE) 10 MG tablet TAKE 1 TABLET (10 MG TOTAL) BY MOUTH DAILY WITH BREAKFAST.   SANTYL ointment Apply topically  daily.   tamsulosin (FLOMAX) 0.4 MG CAPS capsule TAKE 1 CAPSULE BY MOUTH EVERY DAY   terbinafine (LAMISIL) 1 % cream Apply 1 application topically 2 (two) times daily.   No facility-administered encounter medications on file as of 09/22/2021.    Patient Active Problem List   Diagnosis Date Noted   Bilateral rotator cuff syndrome 11/07/2020   Syncope and collapse 10/20/2020   Hyperglycemia 10/19/2020   Aortic atherosclerosis ([place]) 10/15/2020   GERD without esophagitis 10/15/2020   Acute blood loss as cause of postoperative anemia 10/15/2020   Vitamin D deficiency 10/15/2020   Closed fracture of right hip ([place]) 10/09/2020   Dysphagia 07/24/2019   Mild protein-calorie malnutrition ([place]) 02/24/2019   Gait instability 01/27/2019   Right inguinal hernia    Chronic diarrhea 12/06/2017   Inguinal hernia 03/02/2014   Constipation 03/02/2014   Chronic respiratory failure with hypoxia ([place]) 12/09/2013   Hemorrhoids 09/28/2013   Hyperlipidemia 06/28/2013   Sinusitis, chronic 11/28/2012   COPD with acute exacerbation ([place]) 11/10/2012   BPH (benign prostatic hyperplasia) 10/22/2012   Insomnia 10/22/2012   HCAP (healthcare-associated pneumonia) 09/05/2012   Hypothyroidism 06/19/2012   Atypical nevi 06/19/2012   Seborrheic keratosis 06/19/2012   History of prostate cancer 11/07/2010   Allergic rhinitis 11/07/2010   COPD GOLD 3/ group D  02 dep 11/07/2010    Past Medical History:  Diagnosis Date   Allergic rhinitis    Cataract    s/p removal   Chronic respiratory failure (HCC)    oxygen 3L at home   Emphysema    Hypothyroid    PNA (pneumonia)    Prostate cancer ([place])     Relevant past medical, surgical, family and social history reviewed and updated as indicated. Interim medical history since our last visit reviewed.  Review of Systems Per HPI unless specifically indicated above     Objective:    BP 110/62 (BP Location: Left Arm, Patient Position: Sitting, Cuff Size: Normal)     Pulse 65    Temp 98.5 F (36.9 C) (Temporal)    Resp 16    Ht 5\' 5"  (1.651 m)    Wt 112 lb 14.4 oz (51.2 kg)    SpO2 95%    BMI 18.79 kg/m   Wt Readings from Last 3 Encounters:  09/22/21 112 lb 14.4 oz (51.2 kg)  07/13/21 118 lb 12.8 oz (53.9 kg)  07/06/21 121 lb 6.4 oz (55.1 kg)    Physical Exam Vitals and nursing note reviewed.  Constitutional:      General: He is not in acute distress.    Appearance: Normal appearance. He is not toxic-appearing.  HENT:     Nose: Nose normal. No congestion or rhinorrhea.     Mouth/Throat:     Mouth: Mucous membranes are moist.     Pharynx: Oropharynx is clear.  Eyes:     General: No scleral icterus. Pulmonary:     Effort: Pulmonary effort is normal. No tachypnea, accessory muscle usage, prolonged expiration or respiratory distress.  Breath sounds: Decreased air movement present.  Skin:    General: Skin is warm and dry.     Coloration: Skin is not jaundiced or pale.     Findings: No erythema.  Neurological:     Mental Status: He is alert. Mental status is at baseline.     Gait: Gait abnormal (sitting in wheelchair).      Assessment & Plan:  1. Acute cough Acute for a couple of weeks.  He was treated for possible aspiration pneumonia with Augmentin about 3 weeks ago.  Will check a chest x-ray today prior to starting antibiotics for possible ongoing pneumonia.  Clinically, he appears well.  His vital signs are stable; I do not appreciate rales or rhonchi on examination today, he is smiling and laughing today during the visit. I did not appreciate any coughing today in office.   - DG Chest 2 View; Future    Follow up plan: Return if symptoms worsen or fail to improve.

## 2021-09-23 DIAGNOSIS — R062 Wheezing: Secondary | ICD-10-CM | POA: Diagnosis not present

## 2021-09-23 DIAGNOSIS — I491 Atrial premature depolarization: Secondary | ICD-10-CM | POA: Diagnosis not present

## 2021-09-23 DIAGNOSIS — I959 Hypotension, unspecified: Secondary | ICD-10-CM | POA: Diagnosis not present

## 2021-09-23 DIAGNOSIS — R0689 Other abnormalities of breathing: Secondary | ICD-10-CM | POA: Diagnosis not present

## 2021-09-23 DIAGNOSIS — R55 Syncope and collapse: Secondary | ICD-10-CM | POA: Diagnosis not present

## 2021-09-26 ENCOUNTER — Other Ambulatory Visit: Payer: Self-pay

## 2021-09-26 ENCOUNTER — Ambulatory Visit (INDEPENDENT_AMBULATORY_CARE_PROVIDER_SITE_OTHER): Payer: PPO | Admitting: Family Medicine

## 2021-09-26 ENCOUNTER — Encounter: Payer: Self-pay | Admitting: Family Medicine

## 2021-09-26 VITALS — BP 108/52 | HR 82 | Temp 97.5°F | Resp 18 | Ht 65.0 in | Wt 112.0 lb

## 2021-09-26 DIAGNOSIS — R008 Other abnormalities of heart beat: Secondary | ICD-10-CM

## 2021-09-26 DIAGNOSIS — J441 Chronic obstructive pulmonary disease with (acute) exacerbation: Secondary | ICD-10-CM

## 2021-09-26 MED ORDER — DOXYCYCLINE HYCLATE 100 MG PO TABS: 100.0000 mg | ORAL_TABLET | Freq: Two times a day (BID) | ORAL | 0 refills | Status: AC

## 2021-09-26 NOTE — Progress Notes (Signed)
Subjective:    Patient ID: John Black, male    DOB: [birth year], 88 y.o.   [name]: [id number]  HPI Called last week with a cough.  Therefore we ordered a chest x-ray as an outpatient in [facility].  Chest x-ray showed no acute findings.  On Saturday he had a low-grade fever to 100.  He felt weak and he felt palpitations in his chest.  His family called 911 and EMS monitor the patient at his home.  Today they bring in a rhythm strip which shows trigeminy.  Patient was having a consistent pattern of a PVC every third heartbeat.  This continued for several minutes according to the patient's family.  They recommended going to the hospital for further evaluation however the patient declined and elected to come to my office today.  He states that he has felt fine since Saturday.  He has had no further fever since Saturday.  His wife states that he is having a cough productive of yellow and brown mucus.  On examination today he is in normal sinus rhythm.  I do not auscultate any PVCs or PACs or atrial fibrillation.  He does have rhonchorous breath sounds anteriorly right greater than left.  He has diminished breath sounds posteriorly with expiratory wheezing but no rhonchi or rails appreciated.  Patient appears to be at his fragile baseline.  He has end-stage COPD and is oxygen dependent.  I suspect that he likely had a viral upper respiratory infection that may be triggering a COPD exacerbation and the strain caused his heart to temporarily experience trigeminy.  He states that he has not had any syncope or near syncope or palpitations since Saturday. Past Medical History:  Diagnosis Date   Allergic rhinitis    Cataract    s/p removal   Chronic respiratory failure (HCC)    oxygen 3L at home   Emphysema    Hypothyroid    PNA (pneumonia)    Prostate cancer [facility])    Past Surgical History:  Procedure Laterality Date   ADENOIDECTOMY     HIP ARTHROPLASTY Right 10/10/2020   Procedure:  ARTHROPLASTY BIPOLAR HIP (HEMIARTHROPLASTY);  Surgeon: Mordecai Rasmussen, MD;  Location: [facility];  Service: Orthopedics;  Laterality: Right;   ROTATOR CUFF REPAIR  1990,2009   bilateral   Seed implant for prostate cancer  2000   TONSILLECTOMY     TRANSURETHRAL RESECTION OF PROSTATE  2001   x2   Current Outpatient Medications on File Prior to Visit  Medication Sig Dispense Refill   acetaminophen (TYLENOL) 500 MG tablet Take 500 mg by mouth every 6 (six) hours as needed.     acetaminophen (TYLENOL) 650 MG CR tablet Take 650 mg by mouth 3 (three) times daily.     albuterol (PROVENTIL) (2.5 MG/3ML) 0.083% nebulizer solution UE 1 VIAL IN NEBULIZER EVERY 6 HOURS AS NEEDED FOR WHEEZING/SOB 150 mL 12   Albuterol Sulfate (PROAIR RESPICLICK) 956 (90 Base) MCG/ACT AEPB Inhale 2 puffs into the lungs every 4 (four) hours as needed.     aspirin EC 81 MG tablet Take 81 mg by mouth. Twice a day from 10/12/2020-11/22/2020 Then take once a day starting 11/23/2020     cholecalciferol (VITAMIN D3) 25 MCG (1000 UNIT) tablet Take 1,000 Units by mouth daily.     diphenhydrAMINE HCl, Sleep, (SLEEPING PO) Take 2 tablets by mouth at bedtime.     diphenoxylate-atropine (LOMOTIL) 2.5-0.025 MG tablet TAKE 1 TABLET BY MOUTH 4 (FOUR) TIMES DAILY  AS NEEDED FOR DIARRHEA OR LOOSE STOOLS 30 tablet 0   donepezil (ARICEPT) 5 MG tablet Take 1 tablet (5 mg total) by mouth at bedtime. 30 tablet 11   Ensure (ENSURE) Take 237 mLs by mouth daily.     erythromycin ophthalmic ointment Place 1 application into both eyes 3 (three) times daily. For 1 week 3.5 g 0   Fluticasone-Umeclidin-Vilant (TRELEGY ELLIPTA) 100-62.5-25 MCG/ACT AEPB Inhale 1 puff into the lungs daily. 60 each 0   Fluticasone-Umeclidin-Vilant (TRELEGY ELLIPTA) 100-62.5-25 MCG/INH AEPB Inhale 1 puff into the lungs daily. 60 each 11   furosemide (LASIX) 40 MG tablet TAKE 1 TABLET BY MOUTH EVERY DAY 90 tablet 1   guaiFENesin (MUCINEX) 600 MG 12 hr tablet Take 600 mg by mouth 2 (two)  times daily as needed for cough.     levothyroxine (SYNTHROID) 75 MCG tablet TAKE 1 TABLET (75 MCG TOTAL) BY MOUTH DAILY. 90 tablet 3   LORazepam (ATIVAN) 0.5 MG tablet Take 1 tablet (0.5 mg total) by mouth 2 (two) times daily as needed for anxiety. 30 tablet 0   Multiple Vitamin (MULTIVITAMIN) tablet Take 1 tablet by mouth daily.     NON FORMULARY Diet: _____ Regular, ___x___ NAS, _______Consistent Carbohydrate, _______NPO _____Other     OVER THE COUNTER MEDICATION Take 1 tablet by mouth 2 (two) times daily. Slippery elm     OXYGEN Inhale 4 L into the lungs continuous.     oxymetazoline (AFRIN) 0.05 % nasal spray Place 1 spray into both nostrils 2 (two) times daily.     polyvinyl alcohol (LIQUIFILM TEARS) 1.4 % ophthalmic solution Place 1 drop into both eyes 3 (three) times daily as needed for dry eyes.     predniSONE (DELTASONE) 10 MG tablet TAKE 1 TABLET (10 MG TOTAL) BY MOUTH DAILY WITH BREAKFAST. 30 tablet 3   SANTYL ointment Apply topically daily.     tamsulosin (FLOMAX) 0.4 MG CAPS capsule TAKE 1 CAPSULE BY MOUTH EVERY DAY 90 capsule 3   terbinafine (LAMISIL) 1 % cream Apply 1 application topically 2 (two) times daily. 30 g 1   No current facility-administered medications on file prior to visit.   Allergies  Allergen Reactions   Morphine Other (See Comments)    REACTION: sweats   Social History   Socioeconomic History   Marital status: Married    Spouse name: Not on file   Number of children: Not on file   Years of education: Not on file   Highest education level: Not on file  Occupational History   Occupation: Chief Financial Officer  Tobacco Use   Smoking status: Former    Packs/day: 1.50    Years: 50.00    Pack years: 75.00    Types: Cigarettes    Quit date: 09/17/2008    Years since quitting: 13.0   Smokeless tobacco: Never  Vaping Use   Vaping Use: Never used  Substance and Sexual Activity   Alcohol use: Yes    Alcohol/week: 1.0 standard drink    Types: 1 Glasses of wine  per week    Comment: each evening   Drug use: No   Sexual activity: Not on file  Other Topics Concern   Not on file  Social History Narrative   Not on file   Social Determinants of Health   Financial Resource Strain: Not on file  Food Insecurity: Not on file  Transportation Needs: Not on file  Physical Activity: Not on file  Stress: Not on file  Social Connections: Not  on file  Intimate Partner Violence: Not on file      Review of Systems  All other systems reviewed and are negative.     Objective:   Physical Exam Constitutional:      General: He is not in acute distress.    Appearance: Normal appearance. He is ill-appearing. He is not toxic-appearing.  Cardiovascular:     Rate and Rhythm: Normal rate and regular rhythm.     Heart sounds: Normal heart sounds. No murmur heard.   No friction rub. No gallop.  Pulmonary:     Effort: Pulmonary effort is normal. No tachypnea or accessory muscle usage.     Breath sounds: Decreased air movement present. Decreased breath sounds, wheezing and rhonchi present. No rales.  Chest:    Neurological:     Mental Status: He is alert.          Assessment & Plan:   Trigeminy  COPD exacerbation ([place]) I believe the patient has a viral upper respiratory infection that likely triggered a COPD exacerbation causing trigeminy.  He is in very frail health.  However today he is back to his baseline.  He is in normal sinus rhythm.  He appears stable.  He does have prominent rhonchi anteriorly in his right lung and does have a cough productive of yellow and brown mucus which is beyond his normal baseline.  Although I feel that he likely had a virus, I will treat the patient for a possible COPD exacerbation with doxycycline 100 mg twice daily for 7 days.  At the present time, there is no cardiac arrhythmia requiring intervention.  Patient will be a poor surgical candidate for any type of defibrillator.  We discussed this and the patient elects  not to proceed with any further additional cardiac work-up.

## 2021-09-28 DIAGNOSIS — L97329 Non-pressure chronic ulcer of left ankle with unspecified severity: Secondary | ICD-10-CM | POA: Diagnosis not present

## 2021-09-28 DIAGNOSIS — J449 Chronic obstructive pulmonary disease, unspecified: Secondary | ICD-10-CM | POA: Diagnosis not present

## 2021-09-28 DIAGNOSIS — Z515 Encounter for palliative care: Secondary | ICD-10-CM | POA: Diagnosis not present

## 2021-09-28 DIAGNOSIS — J9611 Chronic respiratory failure with hypoxia: Secondary | ICD-10-CM | POA: Diagnosis not present

## 2021-10-04 ENCOUNTER — Other Ambulatory Visit: Payer: Self-pay

## 2021-10-05 ENCOUNTER — Other Ambulatory Visit: Payer: Self-pay

## 2021-10-05 ENCOUNTER — Telehealth: Payer: Self-pay

## 2021-10-05 ENCOUNTER — Telehealth: Payer: Self-pay | Admitting: Pharmacist

## 2021-10-05 DIAGNOSIS — I10 Essential (primary) hypertension: Secondary | ICD-10-CM | POA: Diagnosis not present

## 2021-10-05 DIAGNOSIS — Z7952 Long term (current) use of systemic steroids: Secondary | ICD-10-CM | POA: Diagnosis not present

## 2021-10-05 DIAGNOSIS — Z8701 Personal history of pneumonia (recurrent): Secondary | ICD-10-CM | POA: Diagnosis not present

## 2021-10-05 DIAGNOSIS — Z9181 History of falling: Secondary | ICD-10-CM | POA: Diagnosis not present

## 2021-10-05 DIAGNOSIS — J9610 Chronic respiratory failure, unspecified whether with hypoxia or hypercapnia: Secondary | ICD-10-CM | POA: Diagnosis not present

## 2021-10-05 DIAGNOSIS — Z96641 Presence of right artificial hip joint: Secondary | ICD-10-CM | POA: Diagnosis not present

## 2021-10-05 DIAGNOSIS — E039 Hypothyroidism, unspecified: Secondary | ICD-10-CM | POA: Diagnosis not present

## 2021-10-05 DIAGNOSIS — Z9981 Dependence on supplemental oxygen: Secondary | ICD-10-CM | POA: Diagnosis not present

## 2021-10-05 DIAGNOSIS — Z87891 Personal history of nicotine dependence: Secondary | ICD-10-CM | POA: Diagnosis not present

## 2021-10-05 DIAGNOSIS — Z7951 Long term (current) use of inhaled steroids: Secondary | ICD-10-CM | POA: Diagnosis not present

## 2021-10-05 DIAGNOSIS — J439 Emphysema, unspecified: Secondary | ICD-10-CM | POA: Diagnosis not present

## 2021-10-05 DIAGNOSIS — Z7982 Long term (current) use of aspirin: Secondary | ICD-10-CM | POA: Diagnosis not present

## 2021-10-05 DIAGNOSIS — Z8546 Personal history of malignant neoplasm of prostate: Secondary | ICD-10-CM | POA: Diagnosis not present

## 2021-10-05 MED ORDER — TAMSULOSIN HCL 0.4 MG PO CAPS: 0.4000 mg | ORAL_CAPSULE | Freq: Every day | ORAL | 3 refills | Status: AC

## 2021-10-05 MED ORDER — MIRTAZAPINE 15 MG PO TABS: ORAL_TABLET | ORAL | 3 refills | Status: AC

## 2021-10-05 NOTE — Progress Notes (Addendum)
Chronic Care Management Pharmacy Assistant   Name: John Black  [name]: [id number] DOB: 1929-03-12   Reason for Encounter: Disease State - General Adherence Call     Recent office visits:  09/26/21 Jenna Luo, MD - Trigeminy - doxycycline (VIBRA-TABS) 100 MG tablet prescribed. Follow up as needed.   09/22/21 Jannette Spanner, NP - Acute Cough - Family Medicine - Chest xray ordered. Return if symptoms worsen.  08/04/21 Noemi Chapel, NP - Family Medicine (Video visit) - Mild Vascular Dementia - erythromycin ophthalmic ointment prescribed. Return to clinic if no improvement.   08/03/21 Jenna Luo, MD - Family Medicine - Mild Vascular Dementia - donepezil (ARICEPT) 5 MG tablet and predniSONE (DELTASONE) 10 MG tablet prescribed. Follow up in 1 month.   07/21/21 Jenna Luo, MD - Family Medicine - Personal history of TIA - No notes available.   Recent consult visits:  08/22/21 Victorino December - Weakness - No notes available.   08/08/21 Victorino December - Palliative care - No notes available.   07/27/21 Baruch Goldmann - unspecified blepharitis - No notes available  07/25/21 Victorino December - Palliative care - No notes available.  07/21/21 Jenna Luo, MD - No notes available.    Hospital visits:  04/28/21 Medication Reconciliation was completed by comparing discharge summary, patients EMR and Pharmacy list, and upon discussion with patient.  Admitted to the hospital on 04/28/21 due to Tinea pedis of left foot. Discharge date was 04/28/21. Discharged from [facility]?Medications Started at [facility] Discharge:?? fluconazole (DIFLUCAN) 150 MG tablet and terbinafine (LAMISIL) 1 % cream  Medication Changes at Hospital Discharge: None noted.   Medications Discontinued at Hospital Discharge: None noted.   Medications that remain the same after Hospital Discharge:??  All other medications will remain the same.    Medications: Outpatient  Encounter Medications as of 10/05/2021  Medication Sig   acetaminophen (TYLENOL) 500 MG tablet Take 500 mg by mouth every 6 (six) hours as needed.   acetaminophen (TYLENOL) 650 MG CR tablet Take 650 mg by mouth 3 (three) times daily.   albuterol (PROVENTIL) (2.5 MG/3ML) 0.083% nebulizer solution UE 1 VIAL IN NEBULIZER EVERY 6 HOURS AS NEEDED FOR WHEEZING/SOB   Albuterol Sulfate (PROAIR RESPICLICK) 384 (90 Base) MCG/ACT AEPB Inhale 2 puffs into the lungs every 4 (four) hours as needed.   aspirin EC 81 MG tablet Take 81 mg by mouth. Twice a day from 10/12/2020-11/22/2020 Then take once a day starting 11/23/2020   cholecalciferol (VITAMIN D3) 25 MCG (1000 UNIT) tablet Take 1,000 Units by mouth daily.   diphenhydrAMINE HCl, Sleep, (SLEEPING PO) Take 2 tablets by mouth at bedtime.   diphenoxylate-atropine (LOMOTIL) 2.5-0.025 MG tablet TAKE 1 TABLET BY MOUTH 4 (FOUR) TIMES DAILY AS NEEDED FOR DIARRHEA OR LOOSE STOOLS   donepezil (ARICEPT) 5 MG tablet Take 1 tablet (5 mg total) by mouth at bedtime.   doxycycline (VIBRA-TABS) 100 MG tablet Take 1 tablet (100 mg total) by mouth 2 (two) times daily.   Ensure (ENSURE) Take 237 mLs by mouth daily.   erythromycin ophthalmic ointment Place 1 application into both eyes 3 (three) times daily. For 1 week   Fluticasone-Umeclidin-Vilant (TRELEGY ELLIPTA) 100-62.5-25 MCG/ACT AEPB Inhale 1 puff into the lungs daily.   Fluticasone-Umeclidin-Vilant (TRELEGY ELLIPTA) 100-62.5-25 MCG/INH AEPB Inhale 1 puff into the lungs daily.   furosemide (LASIX) 40 MG tablet TAKE 1 TABLET BY MOUTH EVERY DAY   guaiFENesin (MUCINEX) 600 MG 12 hr tablet Take 600 mg by  mouth 2 (two) times daily as needed for cough.   levothyroxine (SYNTHROID) 75 MCG tablet TAKE 1 TABLET (75 MCG TOTAL) BY MOUTH DAILY.   LORazepam (ATIVAN) 0.5 MG tablet Take 1 tablet (0.5 mg total) by mouth 2 (two) times daily as needed for anxiety.   Multiple Vitamin (MULTIVITAMIN) tablet Take 1 tablet by mouth daily.   NON  FORMULARY Diet: _____ Regular, ___x___ NAS, _______Consistent Carbohydrate, _______NPO _____Other   OVER THE COUNTER MEDICATION Take 1 tablet by mouth 2 (two) times daily. Slippery elm   OXYGEN Inhale 4 L into the lungs continuous.   oxymetazoline (AFRIN) 0.05 % nasal spray Place 1 spray into both nostrils 2 (two) times daily.   polyvinyl alcohol (LIQUIFILM TEARS) 1.4 % ophthalmic solution Place 1 drop into both eyes 3 (three) times daily as needed for dry eyes.   predniSONE (DELTASONE) 10 MG tablet TAKE 1 TABLET (10 MG TOTAL) BY MOUTH DAILY WITH BREAKFAST.   SANTYL ointment Apply topically daily.   tamsulosin (FLOMAX) 0.4 MG CAPS capsule Take 1 capsule (0.4 mg total) by mouth daily.   terbinafine (LAMISIL) 1 % cream Apply 1 application topically 2 (two) times daily.   No facility-administered encounter medications on file as of 10/05/2021.   Have you had any problems recently with your health? Patient's wife reported patient was currently doing well and had no recent problems and doing well.   Have you had any problems with your pharmacy? Patient's wife denied any problems with current pharmacy.   What issues or side effects are you having with your medications? Patient's wife denied any issues or side effects with current medications.   What would you like me to pass along to Leata Mouse, CPP for them to help you with?  Patient's wife reported patient is doing well and has been doing better and tolerating the Aricept.  What can we do to take care of you better? Patient's wife did not have any recommendations at this time.   Care Gaps  AWV: unknown  Colonoscopy: aged out  DM Eye Exam: N/A DM Foot Exam: N/A Microalbumin: N/A HbgAIC: done 09/09/20 (5.7) DEXA:  N/A Mammogram: N/A  Star Rating Drugs: No Star Rating Drugs noted.   Future Appointments  Date Time Provider [place]  02/01/2022  4:15 PM [facility] PHARMACIST [facility] None    Jobe Gibbon,  CCMA Clinical Pharmacist Assistant  [phone]  5 minutes spent in review, coordination, and documentation.  Reviewed by: Beverly Milch, PharmD Clinical Pharmacist [phone]

## 2021-10-05 NOTE — Telephone Encounter (Signed)
Amy w/[facility] called to request verbal orders for PT 2xw/4w. Verbal approval given. Nothing further needed at this time.

## 2021-10-10 DIAGNOSIS — L89522 Pressure ulcer of left ankle, stage 2: Secondary | ICD-10-CM | POA: Diagnosis not present

## 2021-10-11 DIAGNOSIS — Z515 Encounter for palliative care: Secondary | ICD-10-CM | POA: Diagnosis not present

## 2021-10-11 DIAGNOSIS — J9611 Chronic respiratory failure with hypoxia: Secondary | ICD-10-CM | POA: Diagnosis not present

## 2021-10-11 DIAGNOSIS — J449 Chronic obstructive pulmonary disease, unspecified: Secondary | ICD-10-CM | POA: Diagnosis not present

## 2021-10-18 DIAGNOSIS — J439 Emphysema, unspecified: Secondary | ICD-10-CM | POA: Diagnosis not present

## 2021-10-18 DIAGNOSIS — Z96641 Presence of right artificial hip joint: Secondary | ICD-10-CM | POA: Diagnosis not present

## 2021-10-18 DIAGNOSIS — E039 Hypothyroidism, unspecified: Secondary | ICD-10-CM | POA: Diagnosis not present

## 2021-10-18 DIAGNOSIS — J9610 Chronic respiratory failure, unspecified whether with hypoxia or hypercapnia: Secondary | ICD-10-CM | POA: Diagnosis not present

## 2021-10-18 DIAGNOSIS — Z7952 Long term (current) use of systemic steroids: Secondary | ICD-10-CM | POA: Diagnosis not present

## 2021-10-18 DIAGNOSIS — I10 Essential (primary) hypertension: Secondary | ICD-10-CM | POA: Diagnosis not present

## 2021-10-18 DIAGNOSIS — Z87891 Personal history of nicotine dependence: Secondary | ICD-10-CM | POA: Diagnosis not present

## 2021-10-18 DIAGNOSIS — Z7951 Long term (current) use of inhaled steroids: Secondary | ICD-10-CM | POA: Diagnosis not present

## 2021-10-18 DIAGNOSIS — Z9181 History of falling: Secondary | ICD-10-CM | POA: Diagnosis not present

## 2021-10-18 DIAGNOSIS — Z8701 Personal history of pneumonia (recurrent): Secondary | ICD-10-CM | POA: Diagnosis not present

## 2021-10-18 DIAGNOSIS — Z7982 Long term (current) use of aspirin: Secondary | ICD-10-CM | POA: Diagnosis not present

## 2021-10-18 DIAGNOSIS — Z8546 Personal history of malignant neoplasm of prostate: Secondary | ICD-10-CM | POA: Diagnosis not present

## 2021-10-18 DIAGNOSIS — Z9981 Dependence on supplemental oxygen: Secondary | ICD-10-CM | POA: Diagnosis not present

## 2021-10-23 ENCOUNTER — Other Ambulatory Visit: Payer: Self-pay

## 2021-10-23 ENCOUNTER — Telehealth: Payer: Self-pay | Admitting: Internal Medicine

## 2021-10-23 MED ORDER — TRELEGY ELLIPTA 100-62.5-25 MCG/ACT IN AEPB: 1.0000 | INHALATION_SPRAY | Freq: Every day | RESPIRATORY_TRACT | 0 refills | Status: DC

## 2021-10-23 MED ORDER — TRELEGY ELLIPTA 100-62.5-25 MCG/ACT IN AEPB: 1.0000 | INHALATION_SPRAY | Freq: Once | RESPIRATORY_TRACT | 11 refills | Status: AC

## 2021-10-23 MED ORDER — TRELEGY ELLIPTA 100-62.5-25 MCG/ACT IN AEPB: 2.0000 | INHALATION_SPRAY | Freq: Two times a day (BID) | RESPIRATORY_TRACT | 11 refills | Status: DC

## 2021-10-23 NOTE — Telephone Encounter (Signed)
Refills sent to pharmacy for patient. Wife notified and given a sample in case they cant get refill today. Nothing further needed.

## 2021-10-24 ENCOUNTER — Other Ambulatory Visit: Payer: Self-pay

## 2021-10-24 ENCOUNTER — Telehealth: Payer: Self-pay

## 2021-10-24 MED ORDER — TRELEGY ELLIPTA 100-62.5-25 MCG/ACT IN AEPB: 1.0000 | INHALATION_SPRAY | Freq: Every day | RESPIRATORY_TRACT | 11 refills | Status: AC

## 2021-10-24 NOTE — Telephone Encounter (Signed)
Wife came by office stating trelegy was sent in as 2 puffs/day instead of one to [facility]. Rx corrected and resent. Nothing further needed.

## 2021-10-30 DIAGNOSIS — Z87891 Personal history of nicotine dependence: Secondary | ICD-10-CM | POA: Diagnosis not present

## 2021-10-30 DIAGNOSIS — I10 Essential (primary) hypertension: Secondary | ICD-10-CM | POA: Diagnosis not present

## 2021-10-30 DIAGNOSIS — E039 Hypothyroidism, unspecified: Secondary | ICD-10-CM | POA: Diagnosis not present

## 2021-10-30 DIAGNOSIS — J439 Emphysema, unspecified: Secondary | ICD-10-CM | POA: Diagnosis not present

## 2021-10-30 DIAGNOSIS — Z7982 Long term (current) use of aspirin: Secondary | ICD-10-CM | POA: Diagnosis not present

## 2021-10-30 DIAGNOSIS — Z9981 Dependence on supplemental oxygen: Secondary | ICD-10-CM | POA: Diagnosis not present

## 2021-10-30 DIAGNOSIS — J9610 Chronic respiratory failure, unspecified whether with hypoxia or hypercapnia: Secondary | ICD-10-CM | POA: Diagnosis not present

## 2021-10-31 ENCOUNTER — Other Ambulatory Visit: Payer: Self-pay | Admitting: Family Medicine

## 2021-11-01 NOTE — Telephone Encounter (Signed)
LOV 09/26/21 Last refill: Lomotil 04/18/21, #30, 0 refills Lorazepam 09/14/21, #30, 0 refills  Please review, thanks!

## 2021-11-02 ENCOUNTER — Other Ambulatory Visit: Payer: Self-pay | Admitting: Family Medicine

## 2021-11-02 MED ORDER — DIPHENOXYLATE-ATROPINE 2.5-0.025 MG PO TABS: 1.0000 | ORAL_TABLET | Freq: Four times a day (QID) | ORAL | 2 refills | Status: DC | PRN

## 2021-11-03 DIAGNOSIS — Z8701 Personal history of pneumonia (recurrent): Secondary | ICD-10-CM | POA: Diagnosis not present

## 2021-11-03 DIAGNOSIS — Z7951 Long term (current) use of inhaled steroids: Secondary | ICD-10-CM | POA: Diagnosis not present

## 2021-11-03 DIAGNOSIS — Z9181 History of falling: Secondary | ICD-10-CM | POA: Diagnosis not present

## 2021-11-03 DIAGNOSIS — E039 Hypothyroidism, unspecified: Secondary | ICD-10-CM | POA: Diagnosis not present

## 2021-11-03 DIAGNOSIS — J9610 Chronic respiratory failure, unspecified whether with hypoxia or hypercapnia: Secondary | ICD-10-CM | POA: Diagnosis not present

## 2021-11-03 DIAGNOSIS — J439 Emphysema, unspecified: Secondary | ICD-10-CM | POA: Diagnosis not present

## 2021-11-03 DIAGNOSIS — Z9981 Dependence on supplemental oxygen: Secondary | ICD-10-CM | POA: Diagnosis not present

## 2021-11-03 DIAGNOSIS — Z8546 Personal history of malignant neoplasm of prostate: Secondary | ICD-10-CM | POA: Diagnosis not present

## 2021-11-03 DIAGNOSIS — Z7952 Long term (current) use of systemic steroids: Secondary | ICD-10-CM | POA: Diagnosis not present

## 2021-11-03 DIAGNOSIS — Z7982 Long term (current) use of aspirin: Secondary | ICD-10-CM | POA: Diagnosis not present

## 2021-11-03 DIAGNOSIS — Z96641 Presence of right artificial hip joint: Secondary | ICD-10-CM | POA: Diagnosis not present

## 2021-11-03 DIAGNOSIS — Z87891 Personal history of nicotine dependence: Secondary | ICD-10-CM | POA: Diagnosis not present

## 2021-11-03 DIAGNOSIS — I10 Essential (primary) hypertension: Secondary | ICD-10-CM | POA: Diagnosis not present

## 2021-11-07 DIAGNOSIS — L89522 Pressure ulcer of left ankle, stage 2: Secondary | ICD-10-CM | POA: Diagnosis not present

## 2021-11-10 DIAGNOSIS — J449 Chronic obstructive pulmonary disease, unspecified: Secondary | ICD-10-CM | POA: Diagnosis not present

## 2021-11-10 DIAGNOSIS — Z515 Encounter for palliative care: Secondary | ICD-10-CM | POA: Diagnosis not present

## 2021-11-10 DIAGNOSIS — J9611 Chronic respiratory failure with hypoxia: Secondary | ICD-10-CM | POA: Diagnosis not present

## 2021-11-14 ENCOUNTER — Telehealth: Payer: Self-pay

## 2021-11-14 NOTE — Telephone Encounter (Signed)
Claiborne Billings w/[facility] called to report patient is still having issues with chronic diarrhea.  He also has increased cough and congestion with no fever or other symptoms. She would like to know if you have any recommendations.  Please advise, thank you!

## 2021-11-15 NOTE — Telephone Encounter (Signed)
Spoke with Donnetta Simpers - she is visiting patient again tomorrow and will advise pt/wife of recommendations. If they would like to try rx she will call and let us know. Nothing further needed at this time.  ? ?

## 2021-11-15 NOTE — Telephone Encounter (Signed)
LMTRC

## 2021-11-16 DIAGNOSIS — Z96641 Presence of right artificial hip joint: Secondary | ICD-10-CM | POA: Diagnosis not present

## 2021-11-16 DIAGNOSIS — I10 Essential (primary) hypertension: Secondary | ICD-10-CM | POA: Diagnosis not present

## 2021-11-16 DIAGNOSIS — Z9181 History of falling: Secondary | ICD-10-CM | POA: Diagnosis not present

## 2021-11-16 DIAGNOSIS — J9610 Chronic respiratory failure, unspecified whether with hypoxia or hypercapnia: Secondary | ICD-10-CM | POA: Diagnosis not present

## 2021-11-16 DIAGNOSIS — Z7951 Long term (current) use of inhaled steroids: Secondary | ICD-10-CM | POA: Diagnosis not present

## 2021-11-16 DIAGNOSIS — Z7952 Long term (current) use of systemic steroids: Secondary | ICD-10-CM | POA: Diagnosis not present

## 2021-11-16 DIAGNOSIS — E039 Hypothyroidism, unspecified: Secondary | ICD-10-CM | POA: Diagnosis not present

## 2021-11-16 DIAGNOSIS — Z9981 Dependence on supplemental oxygen: Secondary | ICD-10-CM | POA: Diagnosis not present

## 2021-11-16 DIAGNOSIS — Z8546 Personal history of malignant neoplasm of prostate: Secondary | ICD-10-CM | POA: Diagnosis not present

## 2021-11-16 DIAGNOSIS — Z8701 Personal history of pneumonia (recurrent): Secondary | ICD-10-CM | POA: Diagnosis not present

## 2021-11-16 DIAGNOSIS — Z7982 Long term (current) use of aspirin: Secondary | ICD-10-CM | POA: Diagnosis not present

## 2021-11-16 DIAGNOSIS — Z87891 Personal history of nicotine dependence: Secondary | ICD-10-CM | POA: Diagnosis not present

## 2021-11-16 DIAGNOSIS — J439 Emphysema, unspecified: Secondary | ICD-10-CM | POA: Diagnosis not present

## 2021-11-17 ENCOUNTER — Ambulatory Visit (INDEPENDENT_AMBULATORY_CARE_PROVIDER_SITE_OTHER): Payer: PPO

## 2021-11-17 ENCOUNTER — Other Ambulatory Visit: Payer: Self-pay

## 2021-11-17 VITALS — Ht 65.0 in | Wt 115.0 lb

## 2021-11-17 DIAGNOSIS — Z0000 Encounter for general adult medical examination without abnormal findings: Secondary | ICD-10-CM | POA: Diagnosis not present

## 2021-11-17 NOTE — Patient Instructions (Signed)
[name] , Thank you for taking time to come for your Medicare Wellness Visit. I appreciate your ongoing commitment to your health goals. Please review the following plan we discussed and let me know if I can assist you in the future.   Screening recommendations/referrals: Colonoscopy: No longer required due to age.  Recommended yearly ophthalmology/optometry visit for glaucoma screening and checkup Recommended yearly dental visit for hygiene and checkup  Vaccinations: Influenza vaccine: Done 06/08/2021 Repeat annually  Pneumococcal vaccine: Done 06/19/2012 and 07/20/2014 Tdap vaccine: Done 10/20/2020. Repeat in 10 years  Shingles vaccine: Discussed.   Covid-19: Done 10/06/2019, 10/26/2019, 07/29/2020  Advanced directives: Please bring a copy of your health care power of attorney and living will to the office to be added to your chart at your convenience.   Conditions/risks identified: KEEP UP THE GOOD WORK!  Next appointment: Follow up in one year for your annual wellness visit. 2024.  Preventive Care 49 Years and Older, Male  Preventive care refers to lifestyle choices and visits with your health care provider that can promote health and wellness. What does preventive care include? A yearly physical exam. This is also called an annual well check. Dental exams once or twice a year. Routine eye exams. Ask your health care provider how often you should have your eyes checked. Personal lifestyle choices, including: Daily care of your teeth and gums. Regular physical activity. Eating a healthy diet. Avoiding tobacco and drug use. Limiting alcohol use. Practicing safe sex. Taking low doses of aspirin every day. Taking vitamin and mineral supplements as recommended by your health care provider. What happens during an annual well check? The services and screenings done by your health care provider during your annual well check will depend on your age, overall health, lifestyle risk  factors, and family history of disease. Counseling  Your health care provider may ask you questions about your: Alcohol use. Tobacco use. Drug use. Emotional well-being. Home and relationship well-being. Sexual activity. Eating habits. History of falls. Memory and ability to understand (cognition). Work and work Statistician. Screening  You may have the following tests or measurements: Height, weight, and BMI. Blood pressure. Lipid and cholesterol levels. These may be checked every 5 years, or more frequently if you are over 75 years old. Skin check. Lung cancer screening. You may have this screening every year starting at age 59 if you have a 30-pack-year history of smoking and currently smoke or have quit within the past 15 years. Fecal occult blood test (FOBT) of the stool. You may have this test every year starting at age 56. Flexible sigmoidoscopy or colonoscopy. You may have a sigmoidoscopy every 5 years or a colonoscopy every 10 years starting at age 66. Prostate cancer screening. Recommendations will vary depending on your family history and other risks. Hepatitis C blood test. Hepatitis B blood test. Sexually transmitted disease (STD) testing. Diabetes screening. This is done by checking your blood sugar (glucose) after you have not eaten for a while (fasting). You may have this done every 1-3 years. Abdominal aortic aneurysm (AAA) screening. You may need this if you are a current or former smoker. Osteoporosis. You may be screened starting at age 6 if you are at high risk. Talk with your health care provider about your test results, treatment options, and if necessary, the need for more tests. Vaccines  Your health care provider may recommend certain vaccines, such as: Influenza vaccine. This is recommended every year. Tetanus, diphtheria, and acellular pertussis (Tdap, Td) vaccine.  You may need a Td booster every 10 years. Zoster vaccine. You may need this after age  16. Pneumococcal 13-valent conjugate (PCV13) vaccine. One dose is recommended after age 37. Pneumococcal polysaccharide (PPSV23) vaccine. One dose is recommended after age 19. Talk to your health care provider about which screenings and vaccines you need and how often you need them. This information is not intended to replace advice given to you by your health care provider. Make sure you discuss any questions you have with your health care provider. Document Released: 09/30/2015 Document Revised: 05/23/2016 Document Reviewed: 07/05/2015 Elsevier Interactive Patient Education  2017 [place] Prevention in the Home Falls can cause injuries. They can happen to people of all ages. There are many things you can do to make your home safe and to help prevent falls. What can I do on the outside of my home? Regularly fix the edges of walkways and driveways and fix any cracks. Remove anything that might make you trip as you walk through a door, such as a raised step or threshold. Trim any bushes or trees on the path to your home. Use bright outdoor lighting. Clear any walking paths of anything that might make someone trip, such as rocks or tools. Regularly check to see if handrails are loose or broken. Make sure that both sides of any steps have handrails. Any raised decks and porches should have guardrails on the edges. Have any leaves, snow, or ice cleared regularly. Use sand or salt on walking paths during winter. Clean up any spills in your garage right away. This includes oil or grease spills. What can I do in the bathroom? Use night lights. Install grab bars by the toilet and in the tub and shower. Do not use towel bars as grab bars. Use non-skid mats or decals in the tub or shower. If you need to sit down in the shower, use a plastic, non-slip stool. Keep the floor dry. Clean up any water that spills on the floor as soon as it happens. Remove soap buildup in the tub or shower  regularly. Attach bath mats securely with double-sided non-slip rug tape. Do not have throw rugs and other things on the floor that can make you trip. What can I do in the bedroom? Use night lights. Make sure that you have a light by your bed that is easy to reach. Do not use any sheets or blankets that are too big for your bed. They should not hang down onto the floor. Have a firm chair that has side arms. You can use this for support while you get dressed. Do not have throw rugs and other things on the floor that can make you trip. What can I do in the kitchen? Clean up any spills right away. Avoid walking on wet floors. Keep items that you use a lot in easy-to-reach places. If you need to reach something above you, use a strong step stool that has a grab bar. Keep electrical cords out of the way. Do not use floor polish or wax that makes floors slippery. If you must use wax, use non-skid floor wax. Do not have throw rugs and other things on the floor that can make you trip. What can I do with my stairs? Do not leave any items on the stairs. Make sure that there are handrails on both sides of the stairs and use them. Fix handrails that are broken or loose. Make sure that handrails are as long as  the stairways. Check any carpeting to make sure that it is firmly attached to the stairs. Fix any carpet that is loose or worn. Avoid having throw rugs at the top or bottom of the stairs. If you do have throw rugs, attach them to the floor with carpet tape. Make sure that you have a light switch at the top of the stairs and the bottom of the stairs. If you do not have them, ask someone to add them for you. What else can I do to help prevent falls? Wear shoes that: Do not have high heels. Have rubber bottoms. Are comfortable and fit you well. Are closed at the toe. Do not wear sandals. If you use a stepladder: Make sure that it is fully opened. Do not climb a closed stepladder. Make sure that  both sides of the stepladder are locked into place. Ask someone to hold it for you, if possible. Clearly mark and make sure that you can see: Any grab bars or handrails. First and last steps. Where the edge of each step is. Use tools that help you move around (mobility aids) if they are needed. These include: Canes. Walkers. Scooters. Crutches. Turn on the lights when you go into a dark area. Replace any light bulbs as soon as they burn out. Set up your furniture so you have a clear path. Avoid moving your furniture around. If any of your floors are uneven, fix them. If there are any pets around you, be aware of where they are. Review your medicines with your doctor. Some medicines can make you feel dizzy. This can increase your chance of falling. Ask your doctor what other things that you can do to help prevent falls. This information is not intended to replace advice given to you by your health care provider. Make sure you discuss any questions you have with your health care provider. Document Released: 06/30/2009 Document Revised: 02/09/2016 Document Reviewed: 10/08/2014 Elsevier Interactive Patient Education  2017 [employer].

## 2021-11-17 NOTE — Progress Notes (Signed)
Subjective:   John Black is a 88 y.o. male who presents for Medicare Annual/Subsequent preventive examination. Virtual Visit via Telephone Note  I connected with  John Black on 11/17/21 at 11:15 AM EST by telephone and verified that I am speaking with the correct person using two identifiers.  Location: Patient: HOME Provider: [facility] Persons participating in the virtual visit: patient/Nurse Health Advisor   I discussed the limitations, risks, security and privacy concerns of performing an evaluation and management service by telephone and the availability of in person appointments. The patient expressed understanding and agreed to proceed.  Interactive audio and video telecommunications were attempted between this nurse and patient, however failed, due to patient having technical difficulties OR patient did not have access to video capability.  We continued and completed visit with audio only.  Some vital signs may be absent or patient reported.   Chriss Driver, LPN  Review of Systems     Cardiac Risk Factors include: advanced age (>81men, >89 women);dyslipidemia;male gender;sedentary lifestyle;Other (see comment), Risk factor comments: COPD     Objective:    Today's Vitals   11/17/21 1120  Weight: 115 lb (52.2 kg)  Height: 5\' 5"  (1.651 m)   Body mass index is 19.14 kg/m.  Advanced Directives 11/17/2021 04/28/2021 11/17/2020 11/14/2020 11/07/2020 10/31/2020 10/28/2020  Does Patient Have a Medical Advance Directive? Yes Yes No Yes Yes Yes Yes  Type of Paramedic of [place];Living will - Living will Living will Living will Living will Living will  Does patient want to make changes to medical advance directive? - - No - Patient declined No - Patient declined No - Patient declined No - Patient declined No - Patient declined  Copy of [place] in Chart? No - copy requested - - - - - -  Would patient like information on  creating a medical advance directive? - - - - - - -  Pre-existing out of facility DNR order (yellow form or pink MOST form) - - - - - - -    Current Medications (verified) Outpatient Encounter Medications as of 11/17/2021  Medication Sig   acetaminophen (TYLENOL) 500 MG tablet Take 500 mg by mouth every 6 (six) hours as needed.   acetaminophen (TYLENOL) 650 MG CR tablet Take 650 mg by mouth 3 (three) times daily.   albuterol (PROVENTIL) (2.5 MG/3ML) 0.083% nebulizer solution UE 1 VIAL IN NEBULIZER EVERY 6 HOURS AS NEEDED FOR WHEEZING/SOB   Albuterol Sulfate (PROAIR RESPICLICK) 902 (90 Base) MCG/ACT AEPB Inhale 2 puffs into the lungs every 4 (four) hours as needed.   aspirin EC 81 MG tablet Take 81 mg by mouth. Twice a day from 10/12/2020-11/22/2020 Then take once a day starting 11/23/2020   cholecalciferol (VITAMIN D3) 25 MCG (1000 UNIT) tablet Take 1,000 Units by mouth daily.   diphenhydrAMINE HCl, Sleep, (SLEEPING PO) Take 2 tablets by mouth at bedtime.   diphenoxylate-atropine (LOMOTIL) 2.5-0.025 MG tablet Take 1 tablet by mouth 4 (four) times daily as needed for diarrhea or loose stools.   donepezil (ARICEPT) 5 MG tablet Take 1 tablet (5 mg total) by mouth at bedtime.   doxycycline (VIBRA-TABS) 100 MG tablet Take 1 tablet (100 mg total) by mouth 2 (two) times daily.   Ensure (ENSURE) Take 237 mLs by mouth daily.   erythromycin ophthalmic ointment Place 1 application into both eyes 3 (three) times daily. For 1 week   Fluticasone-Umeclidin-Vilant (TRELEGY ELLIPTA) 100-62.5-25 MCG/ACT AEPB Inhale 1  puff into the lungs daily.   furosemide (LASIX) 40 MG tablet TAKE 1 TABLET BY MOUTH EVERY DAY   guaiFENesin (MUCINEX) 600 MG 12 hr tablet Take 600 mg by mouth 2 (two) times daily as needed for cough.   levothyroxine (SYNTHROID) 75 MCG tablet TAKE 1 TABLET (75 MCG TOTAL) BY MOUTH DAILY.   LORazepam (ATIVAN) 0.5 MG tablet TAKE 1 TABLET BY MOUTH 2 TIMES DAILY AS NEEDED FOR ANXIETY.   mirtazapine (REMERON)  15 MG tablet Take half a tab po qhs   Multiple Vitamin (MULTIVITAMIN) tablet Take 1 tablet by mouth daily.   NON FORMULARY Diet: _____ Regular, ___x___ NAS, _______Consistent Carbohydrate, _______NPO _____Other   OVER THE COUNTER MEDICATION Take 1 tablet by mouth 2 (two) times daily. Slippery elm   OXYGEN Inhale 4 L into the lungs continuous.   oxymetazoline (AFRIN) 0.05 % nasal spray Place 1 spray into both nostrils 2 (two) times daily.   polyvinyl alcohol (LIQUIFILM TEARS) 1.4 % ophthalmic solution Place 1 drop into both eyes 3 (three) times daily as needed for dry eyes.   predniSONE (DELTASONE) 10 MG tablet TAKE 1 TABLET (10 MG TOTAL) BY MOUTH DAILY WITH BREAKFAST.   SANTYL ointment Apply topically daily.   tamsulosin (FLOMAX) 0.4 MG CAPS capsule Take 1 capsule (0.4 mg total) by mouth daily.   terbinafine (LAMISIL) 1 % cream Apply 1 application topically 2 (two) times daily.   No facility-administered encounter medications on file as of 11/17/2021.    Allergies (verified) Morphine   History: Past Medical History:  Diagnosis Date   Allergic rhinitis    Cataract    s/p removal   Chronic respiratory failure (HCC)    oxygen 3L at home   Emphysema    Hypothyroid    PNA (pneumonia)    Prostate cancer [facility])    Past Surgical History:  Procedure Laterality Date   ADENOIDECTOMY     HIP ARTHROPLASTY Right 10/10/2020   Procedure: ARTHROPLASTY BIPOLAR HIP (HEMIARTHROPLASTY);  Surgeon: Mordecai Rasmussen, MD;  Location: [facility];  Service: Orthopedics;  Laterality: Right;   ROTATOR CUFF REPAIR  1990,2009   bilateral   Seed implant for prostate cancer  2000   TONSILLECTOMY     TRANSURETHRAL RESECTION OF PROSTATE  2001   x2   Family History  Problem Relation Age of Onset   Heart disease Mother    Prostate cancer Father    Social History   Socioeconomic History   Marital status: Married    Spouse name: Not on file   Number of children: Not on file   Years of education: Not on file    Highest education level: Not on file  Occupational History   Occupation: Chief Financial Officer  Tobacco Use   Smoking status: Former    Packs/day: 1.50    Years: 50.00    Pack years: 75.00    Types: Cigarettes    Quit date: 09/17/2008    Years since quitting: 13.1   Smokeless tobacco: Never  Vaping Use   Vaping Use: Never used  Substance and Sexual Activity   Alcohol use: Yes    Alcohol/week: 1.0 Black drink    Types: 1 Glasses of wine per week    Comment: each evening   Drug use: No   Sexual activity: Not on file  Other Topics Concern   Not on file  Social History Narrative   Not on file   Social Determinants of Health   Financial Resource Strain: Low Risk  Difficulty of Paying Living Expenses: Not hard at all  Food Insecurity: No Food Insecurity   Worried About [place] in the Last Year: Never true   Ran Out of Food in the Last Year: Never true  Transportation Needs: No Transportation Needs   Lack of Transportation (Medical): No   Lack of Transportation (Non-Medical): No  Physical Activity: Sufficiently Active   Days of Exercise per Week: 3 days   Minutes of Exercise per Session: 60 min  Stress: No Stress Concern Present   Feeling of Stress : Not at all  Social Connections: Socially Integrated   Frequency of Communication with Friends and Family: More than three times a week   Frequency of Social Gatherings with Friends and Family: More than three times a week   Attends Religious Services: 1 to 4 times per year   Active Member of [employer] or Organizations: No   Attends Music therapist: 1 to 4 times per year   Marital Status: Married    Tobacco Counseling Counseling given: Not Answered   Clinical Intake:  Pre-visit preparation completed: Yes  Pain : No/denies pain     BMI - recorded: 19.14 Nutritional Status: BMI of 19-24  Normal Nutritional Risks: None Diabetes: No  How often do you need to have someone help you when you read  instructions, pamphlets, or other written materials from your doctor or pharmacy?: 1 - Never  Diabetic?NO  Interpreter Needed?: No  Information entered by :: mj Mikhaela Zaugg, lpn   Activities of Daily Living In your present state of health, do you have any difficulty performing the following activities: 11/17/2021  Hearing? Y  Comment Hard of hearing, wears hearing aids.  Vision? Y  Comment Currently on medication to help with memory.  Difficulty concentrating or making decisions? Y  Walking or climbing stairs? Y  Comment Pt is wheelchair bound.  Dressing or bathing? Y  Doing errands, shopping? Y  Preparing Food and eating ? Y  Using the Toilet? Y  In the past six months, have you accidently leaked urine? Y  Do you have problems with loss of bowel control? Y  Managing your Medications? Y  Managing your Finances? Y  Housekeeping or managing your Housekeeping? Y  Some recent data might be hidden    Patient Care Team: Susy Frizzle, MD as PCP - General (Family Medicine) Satira Sark, MD as PCP - Cardiology (Cardiology) Edythe Clarity, [facility] as Pharmacist (Pharmacist)  Indicate any recent Medical Services you may have received from other than [facility] providers in the past year (date may be approximate).     Assessment:   This is a routine wellness examination for John Black.  Hearing/Vision screen Hearing Screening - Comments:: Hearing aids  Vision Screening - Comments:: No glasses. [facility] 2022.  Dietary issues and exercise activities discussed: Current Exercise Habits: Home exercise routine, Type of exercise: Other - see comments, Time (Minutes): 60, Frequency (Times/Week): 3, Weekly Exercise (Minutes/Week): 180, Intensity: Mild, Exercise limited by: cardiac condition(s);respiratory conditions(s)   Goals Addressed             This Visit's Progress    Have 3 meals a day       Continue to eat healthy       Depression Screen PHQ 2/9 Scores 11/17/2021  07/12/2020 03/11/2020 11/11/2019 05/13/2019 01/27/2019 12/23/2018  PHQ - 2 Score 0 0 0 0 1 0 0  PHQ- 9 Score - - - 0 - - -  Fall Risk Fall Risk  11/17/2021 07/12/2020 03/11/2020 11/11/2019 05/13/2019  Falls in the past year? 0 0 0 1 0  Number falls in past yr: 0 0 - 0 -  Injury with Fall? 0 0 - 0 -  Risk for fall due to : Impaired balance/gait;Impaired mobility;History of fall(s) - Impaired mobility;Impaired balance/gait Impaired balance/gait;Impaired mobility;History of fall(s) -  Follow up Falls prevention discussed Falls evaluation completed Falls evaluation completed Falls evaluation completed Falls evaluation completed    [place]:  Any stairs in or around the home? Yes  If so, are there any without handrails? No  Home free of loose throw rugs in walkways, pet beds, electrical cords, etc? Yes  Adequate lighting in your home to reduce risk of falls? Yes   ASSISTIVE DEVICES UTILIZED TO PREVENT FALLS:  Life alert? No  Use of a cane, walker or w/c? Yes  Grab bars in the bathroom? Yes  Shower chair or bench in shower? Yes  Elevated toilet seat or a handicapped toilet? Yes   TIMED UP AND GO:  Was the test performed? No .  Phone visit.  Cognitive Function: Patient has current diagnosis of cognitive impairment. 11/17/2021. Patient is unable to complete screening 6CIT or MMSE.          Immunizations Immunization History  Administered Date(s) Administered   Fluad Quad(high Dose 65+) 05/13/2019, 07/12/2020, 06/08/2021   Influenza Whole 05/30/2011, 06/17/2012   Influenza, High Dose Seasonal PF 05/28/2017, 06/19/2018   Influenza,inj,Quad PF,6+ Mos 05/28/2013, 06/10/2014, 06/17/2015, 06/22/2016   PFIZER(Purple Top)SARS-COV-2 Vaccination 10/06/2019, 10/26/2019, 07/29/2020   Pneumococcal Conjugate-13 07/20/2014   Pneumococcal Polysaccharide-23 06/19/2012   Tdap 10/20/2020    TDAP status: Up to date  Flu Vaccine status: Up to date  Pneumococcal  vaccine status: Up to date  Covid-19 vaccine status: Completed vaccines  Qualifies for Shingles Vaccine? Yes   Zostavax completed No   Shingrix Completed?: No.    Education has been provided regarding the importance of this vaccine. Patient has been advised to call insurance company to determine out of pocket expense if they have not yet received this vaccine. Advised may also receive vaccine at local pharmacy or Health Dept. Verbalized acceptance and understanding.  Screening Tests Health Maintenance  Topic Date Due   Zoster Vaccines- Shingrix (1 of 2) Never done   COVID-19 Vaccine (4 - Booster for Pfizer series) 09/23/2020   TETANUS/TDAP  10/20/2030   Pneumonia Vaccine 39+ Years old  Completed   INFLUENZA VACCINE  Completed   HPV VACCINES  Aged Out    Health Maintenance  Health Maintenance Due  Topic Date Due   Zoster Vaccines- Shingrix (1 of 2) Never done   COVID-19 Vaccine (4 - Booster for Pfizer series) 09/23/2020    Colorectal cancer screening: No longer required.   Lung Cancer Screening: (Low Dose CT Chest recommended if Age 33-80 years, 30 pack-year currently smoking OR have quit w/in 15years.) does not qualify.    Additional Screening:  Hepatitis C Screening: does not qualify;   Vision Screening: Recommended annual ophthalmology exams for early detection of glaucoma and other disorders of the eye. Is the patient up to date with their annual eye exam?  Yes  Who is the provider or what is the name of the office in which the patient attends annual eye exams? Dr. Liane Comber If pt is not established with a provider, would they like to be referred to a provider to establish care? No .  Dental Screening: Recommended annual dental exams for proper oral hygiene  Community Resource Referral / Chronic Care Management: CRR required this visit?  No   CCM required this visit?  No      Plan:     I have personally reviewed and noted the following in the patients  chart:   Medical and social history Use of alcohol, tobacco or illicit drugs  Current medications and supplements including opioid prescriptions. Patient is not currently taking opioid prescriptions. Functional ability and status Nutritional status Physical activity Advanced directives List of other physicians Hospitalizations, surgeries, and ER visits in previous 12 months Vitals Screenings to include cognitive, depression, and falls Referrals and appointments  In addition, I have reviewed and discussed with patient certain preventive protocols, quality metrics, and best practice recommendations. A written personalized care plan for preventive services as well as general preventive health recommendations were provided to patient.     Chriss Driver, LPN   10/26/9240   Nurse Notes: Visit completed with patient and pt's wife John Black due to pt's hearing status. Pt is up to date on all age appropriate health maintenance and vaccines.

## 2021-11-28 DIAGNOSIS — L89522 Pressure ulcer of left ankle, stage 2: Secondary | ICD-10-CM | POA: Diagnosis not present

## 2021-11-30 ENCOUNTER — Telehealth: Payer: Self-pay

## 2021-11-30 NOTE — Telephone Encounter (Signed)
Spoke with pt's wife, Jaci Standard. Pt currently uses [employer] as his oxygen supplier but needs to change to [employer]. [employer] does not supply continuous O2, only pulse. Pt's contract with [employer] expires on 12/05/21. Can order be faxed to [place]?  ?Order placed on your desk to sign.  ?Thanks. MJ ?

## 2021-12-05 ENCOUNTER — Encounter: Payer: Self-pay | Admitting: Internal Medicine

## 2021-12-05 ENCOUNTER — Other Ambulatory Visit: Payer: Self-pay

## 2021-12-05 ENCOUNTER — Ambulatory Visit: Payer: PPO | Admitting: Internal Medicine

## 2021-12-05 DIAGNOSIS — J449 Chronic obstructive pulmonary disease, unspecified: Secondary | ICD-10-CM

## 2021-12-05 DIAGNOSIS — J9611 Chronic respiratory failure with hypoxia: Secondary | ICD-10-CM | POA: Diagnosis not present

## 2021-12-05 MED ORDER — TRELEGY ELLIPTA 100-62.5-25 MCG/ACT IN AEPB: 1.0000 | INHALATION_SPRAY | Freq: Every day | RESPIRATORY_TRACT | 0 refills | Status: AC

## 2021-12-05 NOTE — Patient Instructions (Signed)
Ok to try albuterol 15 min before an activity (on alternating days)  that you know would usually make you short of breath and see if it makes any difference and if makes none then don't take albuterol after activity unless you can't catch your breath as this means it's the resting that helps, not the albuterol. ? ?Blow the trelegy out through the nose  ? ?Make sure you check your oxygen saturation  AT  your highest level of activity (not after you stop)   to be sure it stays over 90% and adjust  02 flow upward to maintain this level if needed but remember to turn it back to previous settings when you stop (to conserve your supply).  ? ?Please schedule a follow up visit in 6 months but call sooner if needed  ? ? ? ?    ? ? ?

## 2021-12-05 NOTE — Assessment & Plan Note (Signed)
04/14/2020   Walked RA  approx   300 ft  @ slow pace  stopped due to  End of study with sats  dropping to 88% at end  ?- 11/07/20  HC03  = 31  ?- 11/22/2020 rec change 02 to 4-5 lpm no pulsing  ? ?Needs to remember to titrate with ex and back off at rest ?Advised: ?Make sure you check your oxygen saturation  AT  your highest level of activity (not after you stop)   to be sure it stays over 90% and adjust  02 flow upward to maintain this level if needed but remember to turn it back to previous settings when you stop (to conserve your supply).  ? ?If not > 90% with ex on max 02 setting (6lpm)  then will need to pace slower ? ?F/u q 6 m, sooner prn  ? ?    ?  ? ?Each maintenance medication was reviewed in detail including emphasizing most importantly the difference between maintenance and prns and under what circumstances the prns are to be triggered using an action plan format where appropriate. ? ?Total time for H and P, chart review, counseling, reviewing dpi/neb/02  device(s) and generating customized AVS unique to this office visit / same day charting > 30 min  ?     ? ? ?

## 2021-12-05 NOTE — Assessment & Plan Note (Signed)
Quit smoking  2010 ?- daily pred since ? 2010?  ?- PFT's 12/17/13  FEV1 1.17 (57 % ) ratio 0.36  p 6 % improvement from saba p ? prior to study with DLCO  11.56 (47%) corrects to 2.19 (52%)  for alv volume and FV curve classic exp concavity on exp loop and air trapping ?- d/c theph 04/14/2020 and max rx for gerd  ?- 05/26/2020   try change trelegy to breztri as prev preferred symbicort ?- 06/21/2020 changed back to trelegy at pt request ?-  11/22/2020   Using proair respiclick as plan B and neb saba as plan C with prednisone 10 mg floor and 20 mg / day ceiling  ? ? Group D in terms of symptom/risk and laba/lama/ICS  therefore appropriate rx at this point >>>  Trelegy 100 and pred 10 mg x years plus approp saba: ? ?Re SABA :  I spent extra time with pt today reviewing appropriate use of albuterol for prn use on exertion with the following points: ?1) saba is for relief of sob that does not improve by walking a slower pace or resting but rather if the pt does not improve after trying this first. ?2) If the pt is convinced, as many are, that saba helps recover from activity faster then it's easy to tell if this is the case by re-challenging : ie stop, take the inhaler, then p 5 minutes try the exact same activity (intensity of workload) that just caused the symptoms and see if they are substantially diminished or not after saba ?3) if there is an activity that reproducibly causes the symptoms, try the saba 15 min before the activity on alternate days  ? ?If in fact the saba really does help, then fine to continue to use it prn but advised may need to look closer at the maintenance regimen being used to achieve better control of airways disease with exertion.  ? ?  ?

## 2021-12-05 NOTE — Progress Notes (Signed)
? ? ?John Black, male    DOB: 1929-05-19     [name]: [id number] ? ? ?Brief patient profile:  ?89 yowm quit smoking in 2010 then major aspiration event? MRSA then dx with GOLD II copd as of 2015 prev followed by Dr Gwenette Greet s/p admit: ? ?Admit date: 02/16/2020 ?Discharge date: 02/19/2020 ? ?Discharge Diagnoses:  ?Principal Problem: ?  COPD with acute exacerbation ([place]) ?  Hypothyroidism ?  BPH (benign prostatic hyperplasia) ?  Acute on chronic respiratory failure with hypoxia ([facility]) ?  Sepsis ([place]) ?  SOB (shortness of breath) ? ?History of present illness:  ?88 y.o. male with medical history significant of COPD/emphysema, chronic respiratory failure on home oxygen, hypothyroidism presenting with complaints of shortness of breath.  Patient reports 3 to 4-day history of progressively worsening dyspnea.  He is also wheezing but not coughing much.  Denies chest pain.  Denies fevers or chills.  States he uses 3 to 4 L supplemental oxygen at home all the time.   ?He was admitted with fever and shortness of breath.  He was found to have a COPD exacerbation.  He's improved with steroids, abx, and scheduled and prn nebs.  6/4 appeared improved and was discharged with plan for outpatient follow up. ?  ?Hospital Course:  ?Acute on chronic hypoxic respiratory failure, suspect secondary to acute COPD exacerbation:  ?CT PE protocol without PE, no acute intrathoracic pathology, advanced emphysema ?Discharge with steroid taper, complete course of azithromycin, resume home nebs ?Ceftriaxone x3 days.  Complete 5 day total course of azithromycin.  Discharged with 2 days azithromycin.  ?Home theophylline ?Stable on home oxygen, maintained O2 sats with 4 L  ?Will need home O2 screen prior to d/c - maintained O2 sats 90% on home 4 L today ?Negative COVID testing, follow RVP negative ?  ?Sinus Tachycardia: unclear etiology? Possibly 2/2 nebs.  HR up to 120-130 with ambulation with therapy. - on review of previous outpatient notes, he was  tachycardic to 120 on 01/22/20 visit and to 108 at 09/23/19 visit.  Possible chronic component.  Daughter notes his HR usually in 80's-90's on pulse ox at home.  EKG and tele show sinus tach.  Normal TSH from admission.  Echo with grade II diastolic dysfunction, normal EF (see report) -> follow outpatient. ?  ?Mild to moderate Dilatation of the ascending aorta: 39 cm on echo - follow outpatient for surveillance ?  ? Febrile Illness:  With COPD exacerbation, respiratory distress, suspect he had  viral illness leading to exacerbation.  ?  ? GERD ?-Continue PPI ?  ?BPH ?-Continue Flomax ?  ?Anemia: suspect component of dilution, repeat H/H ?  ?Procedures: ?Echo ?IMPRESSIONS  ? ? ? 1. Left ventricular ejection fraction, by estimation, is 60 to 65%. The  ?left ventricle has normal function. The left ventricle has no regional  ?wall motion abnormalities. Left ventricular diastolic parameters are  ?consistent with Grade II diastolic  ?dysfunction (pseudonormalization).  ? 2. Right ventricular systolic function is normal. The right ventricular  ?size is normal.  ? 3. The mitral valve is normal in structure. Mild mitral valve  ?regurgitation. No evidence of mitral stenosis.  ? 4. The aortic valve is normal in structure. Aortic valve regurgitation is  ?not visualized. No aortic stenosis is present.  ? 5. Aortic dilatation noted. There is mild to moderate dilatation of the  ?ascending aorta measuring 39 mm.  ?  ? ? ? ? ? ?History of Present Illness  ?04/14/2020  Pulmonary/ 1st  office eval/ [name] / [name] / trelegy / prednisone 10  X years  ?Chief Complaint  ?Patient presents with  ? Consult  ?  shortness of breath with exertion  ?Dyspnea:  MMRC3 = can't walk 100 yards even at a slow pace at a flat grade s stopping due to sob - uses w/c a lot ?Cough: hoarse, some choking with swallowing  ?Sleep: bed is flat couple of pillows ?SABA use: neb in am ?02 4 lpm hs and 3-4 lpm with sitting/ walking  ?rec ?Stop uniphyl  ?Continue  protonix Take 30-60 min before first meal of the day  ?GERD  Diet  ?If breathing gets worse double the prednisone until better then back to 10 mg daily  ?Make sure you check your oxygen saturations at highest level of activity to be sure it stays over 90% and adjust upward to maintain this level if needed but remember to turn it back to previous settings when you stop (to conserve your supply).  ?Please schedule a follow up office visit in 6 weeks, call sooner if needed - bring inhalers   ? ?  ?  ?06/21/2020  f/u ov/[name] [facility]/Purnell Daigle re:  GOLD III / 02 dep on breztri/ pred 10 mg ?Chief Complaint  ?Patient presents with  ? Acute Visit  ?Dyspnea:  Room to room  ?Cough: more copious/ white / not using max mucinex/ no using flutter as rec  ?Sleeping: flat bed two pillows ?SABA use: not using hfa/ neb not understanding when to use  Vs prednisone 20 as plan D above  ?02: 3-5 lpm titrates  ?rec ?Plan A = Automatic = Always=    Trelegy one click each am   ? Plan B = Backup (to supplement plan A, not to replace it) ?Only use your albuterol inhaler as a rescue medication ?Plan C = Crisis (instead of Plan B but only if Plan B stops working) ?- only use your albuterol nebulizer if you first try Plan B  ?Plan D = deltasone if losing ground and needing B and  C ?- double prednisone until better then 1 daily  ?For cough/ congestion > mucinex up to 1200 mg  twice and use the flutter as much as possible  ? ? ? ?06/08/2021  f/u ov/[place] office/Amauri Medellin re: GOLD 3 but 02 dep, maint on trelegy  and prednisone @ 10 mg daily   ?Chief Complaint  ?Patient presents with  ? Follow-up  ?  4L O2 all of the time while sleeping and during the day. 5L O2 prn. Uses nebulizer every morning. Wife states sometimes he seems SOB but O2 levels reading high.   ?Dyspnea:  walking across the room on his own with PT on 4lpm  ?Cough: very little  ?Sleeping: flat bed / 2 pillows  ?SABA use: rarely  ?02: 4- 5lpm  ?Covid status: x 3 vax  ?Rec ?Make sure you  check your oxygen saturation at your highest level of activity  to be sure it stays over 90%  ?Ok to try albuterol 15 min before an activity (on alternating days)  that you know would usually make you short of breath   ?If swelling in legs is not well controlled I would prefer aldactone be added 25 mg twice daily but not needed now and must be monitored carefully by same prescriber as lasix  ? ? ? ?12/05/2021  f/u ov/[place] office/Nour Scalise re: GOLD 3 but 02 dep maint on trelegy 100  / pred 10 mg daily x "  years" ?Chief Complaint  ?Patient presents with  ? Follow-up  ?  Patient feels his breathing is still doing well.  ?Would like 2 trelegy 100 samples today   ? Dyspnea:  graduated PT one week but still w/c to bathroom  ?Can do sitting up from chair x 10 x then 02 sats fall to upper 80s and not titrating as rec  ?Cough: none  but more sneezing in spring  ?Sleeping: flat bed/ 2 pillows  ?SABA use: neb each am and rare respiclick  ?02: 4lpm 24/7  ?  ?No obvious day to day or daytime variability or assoc excess/ purulent sputum or mucus plugs or hemoptysis or cp or chest tightness, subjective wheeze or overt  hb symptoms.  ? ?Sleeping  without nocturnal  or early am exacerbation  of respiratory  c/o's or need for noct saba. Also denies any obvious fluctuation of symptoms with weather or environmental changes or other aggravating or alleviating factors except as outlined above  ? ?No unusual exposure hx or h/o childhood pna/ asthma or knowledge of premature birth. ? ?Current Allergies, Complete Past Medical History, Past Surgical History, Family History, and Social History were reviewed in [employer] record. ? ?ROS  The following are not active complaints unless bolded ?Hoarseness, sore throat, dysphagia, dental problems, itching, sneezing,  nasal congestion or discharge of excess mucus or purulent secretions, ear ache,   fever, chills, sweats, unintended wt loss or wt gain, classically pleuritic or  exertional cp,  orthopnea pnd or arm/hand swelling  or leg swelling, presyncope, palpitations, abdominal pain, anorexia, nausea, vomiting, diarrhea  or change in bowel habits or change in bladder hab

## 2021-12-06 ENCOUNTER — Other Ambulatory Visit: Payer: Self-pay | Admitting: Family Medicine

## 2021-12-07 NOTE — Telephone Encounter (Signed)
LOV 09/26/21 ?Last refill 11/12/21, #30, 2 refills ? ? ?Please review, thanks! ? ?

## 2021-12-08 ENCOUNTER — Other Ambulatory Visit: Payer: Self-pay

## 2021-12-08 ENCOUNTER — Ambulatory Visit (INDEPENDENT_AMBULATORY_CARE_PROVIDER_SITE_OTHER): Payer: PPO | Admitting: Family Medicine

## 2021-12-08 VITALS — BP 118/59 | HR 85 | Temp 97.4°F | Ht 65.0 in | Wt 118.0 lb

## 2021-12-08 DIAGNOSIS — K529 Noninfective gastroenteritis and colitis, unspecified: Secondary | ICD-10-CM

## 2021-12-08 MED ORDER — LORAZEPAM 0.5 MG PO TABS: 0.5000 mg | ORAL_TABLET | Freq: Two times a day (BID) | ORAL | 2 refills | Status: AC | PRN

## 2021-12-08 MED ORDER — CHOLESTYRAMINE LIGHT 4 G PO PACK: 2.0000 g | PACK | Freq: Two times a day (BID) | ORAL | 3 refills | Status: DC

## 2021-12-08 NOTE — Progress Notes (Signed)
? ?Subjective:  ? ? Patient ID: John Black, male    DOB: 19-Oct-1928, 88 y.o.   [name]: [id number] ? ?HPI ?Patient is a 88 year old Caucasian gentleman with end-stage COPD on chronic oxygen.  He also has chronic diarrhea that we have been treating symptomatically with Lomotil due to his advanced age and serious medical comorbidities.  To be honest, I am concerned that the patient is too frail to tolerate a colonoscopy to evaluate the cause of the diarrhea given his oxygen requirement and tentative respiratory status.  He is on 4 L of oxygen along with Trelegy for chronic O2 dependent respiratory failure secondary to COPD.  He has been battling diarrhea for the last year.  His wife states that he goes 1-2 times a day however it is almost pure water.  He has blowouts.  His most recent episode has been going on for the last 4 to 5 weeks.  He will have 2 episodes of copious watery diarrhea and fecal incontinence every day despite taking Lomotil 4 times a day.  Initially the Lomotil worked well at managing his symptoms but is no longer working.  His wife tried 1 dose of Imodium yesterday with no success.  It is not the frequency but rather the consistency that seems to be his problem.  He denies any abdominal pain or fever or chills or melena or hematochezia or nausea or vomiting. ?Past Medical History:  ?Diagnosis Date  ? Allergic rhinitis   ? Cataract   ? s/p removal  ? Chronic respiratory failure ([place])   ? oxygen 3L at home  ? Emphysema   ? Hypothyroid   ? PNA (pneumonia)   ? Prostate cancer ([place])   ? ?Past Surgical History:  ?Procedure Laterality Date  ? ADENOIDECTOMY    ? HIP ARTHROPLASTY Right 10/10/2020  ? Procedure: ARTHROPLASTY BIPOLAR HIP (HEMIARTHROPLASTY);  Surgeon: Mordecai Rasmussen, MD;  Location: [facility];  Service: Orthopedics;  Laterality: Right;  ? ROTATOR CUFF REPAIR  1990,2009  ? bilateral  ? Seed implant for prostate cancer  2000  ? TONSILLECTOMY    ? TRANSURETHRAL RESECTION OF PROSTATE  2001  ? x2   ? ?Current Outpatient Medications on File Prior to Visit  ?Medication Sig Dispense Refill  ? diphenoxylate-atropine (LOMOTIL) 2.5-0.025 MG tablet TAKE 1 TABLET BY MOUTH 4 (FOUR) TIMES DAILY AS NEEDED FOR DIARRHEA OR LOOSE STOOLS. 30 tablet 2  ? acetaminophen (TYLENOL) 500 MG tablet Take 500 mg by mouth every 6 (six) hours as needed.    ? acetaminophen (TYLENOL) 650 MG CR tablet Take 650 mg by mouth 3 (three) times daily.    ? albuterol (PROVENTIL) (2.5 MG/3ML) 0.083% nebulizer solution UE 1 VIAL IN NEBULIZER EVERY 6 HOURS AS NEEDED FOR WHEEZING/SOB 150 mL 12  ? Albuterol Sulfate (PROAIR RESPICLICK) 546 (90 Base) MCG/ACT AEPB Inhale 2 puffs into the lungs every 4 (four) hours as needed.    ? aspirin EC 81 MG tablet Take 81 mg by mouth. Twice a day from 10/12/2020-11/22/2020 Then take once a day starting 11/23/2020    ? cholecalciferol (VITAMIN D3) 25 MCG (1000 UNIT) tablet Take 1,000 Units by mouth daily.    ? diphenhydrAMINE HCl, Sleep, (SLEEPING PO) Take 2 tablets by mouth at bedtime.    ? donepezil (ARICEPT) 5 MG tablet Take 1 tablet (5 mg total) by mouth at bedtime. 30 tablet 11  ? doxycycline (VIBRA-TABS) 100 MG tablet Take 1 tablet (100 mg total) by mouth 2 (two) times daily. [place]  tablet 0  ? Ensure (ENSURE) Take 237 mLs by mouth daily.    ? Fluticasone-Umeclidin-Vilant (TRELEGY ELLIPTA) 100-62.5-25 MCG/ACT AEPB Inhale 1 puff into the lungs daily. 60 each 11  ? Fluticasone-Umeclidin-Vilant (TRELEGY ELLIPTA) 100-62.5-25 MCG/ACT AEPB Inhale 1 puff into the lungs daily. 60 each 0  ? furosemide (LASIX) 40 MG tablet TAKE 1 TABLET BY MOUTH EVERY DAY 90 tablet 1  ? guaiFENesin (MUCINEX) 600 MG 12 hr tablet Take 600 mg by mouth 2 (two) times daily as needed for cough.    ? levothyroxine (SYNTHROID) 75 MCG tablet TAKE 1 TABLET (75 MCG TOTAL) BY MOUTH DAILY. 90 tablet 3  ? LORazepam (ATIVAN) 0.5 MG tablet TAKE 1 TABLET BY MOUTH 2 TIMES DAILY AS NEEDED FOR ANXIETY. 30 tablet 0  ? mirtazapine (REMERON) 15 MG tablet Take half a  tab po qhs 45 tablet 3  ? Multiple Vitamin (MULTIVITAMIN) tablet Take 1 tablet by mouth daily.    ? NON FORMULARY Diet: _____ Regular, ?___x___ NAS, ?_______Consistent Carbohydrate, ?_______NPO ?_____Other    ? OVER THE COUNTER MEDICATION Take 1 tablet by mouth 2 (two) times daily. [place]    ? OXYGEN Inhale 4 L into the lungs continuous.    ? polyvinyl alcohol (LIQUIFILM TEARS) 1.4 % ophthalmic solution Place 1 drop into both eyes 3 (three) times daily as needed for dry eyes.    ? predniSONE (DELTASONE) 10 MG tablet TAKE 1 TABLET (10 MG TOTAL) BY MOUTH DAILY WITH BREAKFAST. 30 tablet 3  ? SANTYL ointment Apply topically daily.    ? tamsulosin (FLOMAX) 0.4 MG CAPS capsule Take 1 capsule (0.4 mg total) by mouth daily. 90 capsule 3  ? terbinafine (LAMISIL) 1 % cream Apply 1 application topically 2 (two) times daily. 30 g 1  ? ?No current facility-administered medications on file prior to visit.  ? ?Allergies  ?Allergen Reactions  ? Morphine Other (See Comments)  ?  REACTION: sweats  ? ?Social History  ? ?Socioeconomic History  ? Marital status: Married  ?  Spouse name: Not on file  ? Number of children: Not on file  ? Years of education: Not on file  ? Highest education level: Not on file  ?Occupational History  ? Occupation: Chief Financial Officer  ?Tobacco Use  ? Smoking status: Former  ?  Packs/day: 1.50  ?  Years: 50.00  ?  Pack years: 75.00  ?  Types: Cigarettes  ?  Quit date: 09/17/2008  ?  Years since quitting: 13.2  ? Smokeless tobacco: Never  ?Vaping Use  ? Vaping Use: Never used  ?Substance and Sexual Activity  ? Alcohol use: Yes  ?  Alcohol/week: 1.0 standard drink  ?  Types: 1 Glasses of wine per week  ?  Comment: each evening  ? Drug use: No  ? Sexual activity: Not on file  ?Other Topics Concern  ? Not on file  ?Social History Narrative  ? Not on file  ? ?Social Determinants of Health  ? ?Financial Resource Strain: Low Risk   ? Difficulty of Paying Living Expenses: Not hard at all  ?Food Insecurity: No Food  Insecurity  ? Worried About Charity fundraiser in the Last Year: Never true  ? Ran Out of Food in the Last Year: Never true  ?Transportation Needs: No Transportation Needs  ? Lack of Transportation (Medical): No  ? Lack of Transportation (Non-Medical): No  ?Physical Activity: Sufficiently Active  ? Days of Exercise per Week: 3 days  ? Minutes of Exercise  per Session: 60 min  ?Stress: No Stress Concern Present  ? Feeling of Stress : Not at all  ?Social Connections: Socially Integrated  ? Frequency of Communication with Friends and Family: More than three times a week  ? Frequency of Social Gatherings with Friends and Family: More than three times a week  ? Attends Religious Services: 1 to 4 times per year  ? Active Member of Clubs or Organizations: No  ? Attends Archivist Meetings: 1 to 4 times per year  ? Marital Status: Married  ?Intimate Partner Violence: Not At Risk  ? Fear of Current or Ex-Partner: No  ? Emotionally Abused: No  ? Physically Abused: No  ? Sexually Abused: No  ? ? ? ? ?Review of Systems  ?All other systems reviewed and are negative. ? ?   ?Objective:  ? Physical Exam ?Constitutional:   ?   General: He is not in acute distress. ?   Appearance: Normal appearance. He is ill-appearing. He is not toxic-appearing.  ?Cardiovascular:  ?   Rate and Rhythm: Normal rate and regular rhythm.  ?   Heart sounds: Normal heart sounds. No murmur heard. ?  No friction rub. No gallop.  ?Pulmonary:  ?   Effort: Pulmonary effort is normal. No tachypnea or accessory muscle usage.  ?   Breath sounds: Decreased air movement present. Decreased breath sounds and rhonchi present. No wheezing or rales.  ?Abdominal:  ?   General: Bowel sounds are normal. There is no distension.  ?   Palpations: Abdomen is soft.  ?   Tenderness: There is no abdominal tenderness. There is no guarding or rebound.  ?Neurological:  ?   Mental Status: He is alert.  ? ? ? ? ? ?   ?Assessment & Plan:  ? ?Chronic diarrhea ?I explained to  the family that due to his frail status and oxygen dependent respiratory failure, he is unable to have a colonoscopy to determine the cause of the diarrhea.  I simply do not believe that he could tolerate or surviv

## 2021-12-09 ENCOUNTER — Other Ambulatory Visit: Payer: Self-pay | Admitting: Family Medicine

## 2021-12-11 ENCOUNTER — Telehealth: Payer: Self-pay

## 2021-12-11 NOTE — Telephone Encounter (Signed)
Pt's spouse called in to inquire about another med that pt could take instead of the med that pt was recently prescribed by pcp for diarrhea. Pt's spouse states that this was a liquid med that she feels pt would not be able to drink and it was too expensive. Pt's spouse would like to know if pcp would send in another med preferably not in liquid form that pt could take. Also pt was prescribed a "memory" pill by pcp. Pt's spouse wanted to know if he was showing signs like he was before should she increase the dosage? Please advise. ? ?Cb#: [phone] ?

## 2021-12-15 ENCOUNTER — Other Ambulatory Visit: Payer: Self-pay | Admitting: Family Medicine

## 2021-12-15 MED ORDER — CHOLESTYRAMINE 4 G PO PACK: 4.0000 g | PACK | Freq: Two times a day (BID) | ORAL | 12 refills | Status: AC

## 2021-12-15 NOTE — Telephone Encounter (Signed)
Spoke with patient's wife, Sherrie (DPR) to advise. She will contact pharmacy to check on rx. She asked about giving him a pre/pro-biotic, she has some that she keeps in the refrigerator. Advised her this should be fine, and may actually help his GI tract. Explained purpose of pre/pro-biotics. Also advised if she cannot pick up this rx she could try powder fiber supplement as this should help add bulk to his stools. She will call us Monday if she has any further issues. Nothing further needed at this time.  ? ?

## 2021-12-20 ENCOUNTER — Telehealth: Payer: Self-pay | Admitting: Pharmacist

## 2021-12-20 NOTE — Progress Notes (Signed)
? ? ?Chronic Care Management ?Pharmacy Assistant  ? ?Name: John Black  [name]: [id number] DOB: 07-06-1929 ? ? ?Reason for Encounter: Disease State - General Adherence Call  ?  ? ?Recent office visits:  ?12/08/21 Jenna Luo, MD - Family Medicine - Chronic diarrhea -  cholestyramine light (PREVALITE) 4 g packet prescribed. due to his frail status and oxygen dependent respiratory failure, he is unable to have a colonoscopy to determine the cause of the diarrhea.  I simply do not believe that he could tolerate or survive the colonoscopy.  He is extremely weak.  Therefore I would recommend symptomatic management only.  I cannot rule out some type of malignancy or inflammatory bowel disease.  Given the fact that is the consistency rather than the frequency of this problem I would continue the Lomotil that I would add cholestyramine 2 g twice daily and see how the patient tolerates this over the next week.  They could also try over-the-counter fiber as a bulking agent.  If this does not work we could try switching Lomotil to Imodium to see if he will do better on this.  Patient and his family are comfortable with plan and not pursuing further diagnostic work-up given his present situation.  They are asking for an order for oxygen to be sent, apothecary.  They are also asking for refill on his Ativan. Follow up as scheduled.  ? ?11/17/21 Annual Medicare Wellness Completed.  ? ?Recent consult visits:  ?12/05/21 Christinia Gully, MD - Pulmonology - COPD - Fluticasone-Umeclidin-Vilant (TRELEGY ELLIPTA) 100-62.5-25 MCG/ACT AEPB prescribed. Follow up as scheduled.  ? ?10/11/21 Victorino December - Encounter for Palliative Care - No notes available.  ? ?10/10/21 Caprice Beaver - Pressure Ulcer of left foot - No notes available.  ? ? ?Hospital visits:  ?None in previous 6 months ? ?Medications: ?Outpatient Encounter Medications as of 12/20/2021  ?Medication Sig  ? acetaminophen (TYLENOL) 500 MG tablet Take 500 mg by mouth every 6  (six) hours as needed.  ? acetaminophen (TYLENOL) 650 MG CR tablet Take 650 mg by mouth 3 (three) times daily.  ? albuterol (PROVENTIL) (2.5 MG/3ML) 0.083% nebulizer solution UE 1 VIAL IN NEBULIZER EVERY 6 HOURS AS NEEDED FOR WHEEZING/SOB  ? Albuterol Sulfate (PROAIR RESPICLICK) 149 (90 Base) MCG/ACT AEPB Inhale 2 puffs into the lungs every 4 (four) hours as needed.  ? aspirin EC 81 MG tablet Take 81 mg by mouth. Twice a day from 10/12/2020-11/22/2020 Then take once a day starting 11/23/2020  ? cholecalciferol (VITAMIN D3) 25 MCG (1000 UNIT) tablet Take 1,000 Units by mouth daily.  ? cholestyramine (QUESTRAN) 4 g packet Take 1 packet (4 g total) by mouth 2 (two) times daily.  ? diphenhydrAMINE HCl, Sleep, (SLEEPING PO) Take 2 tablets by mouth at bedtime.  ? diphenoxylate-atropine (LOMOTIL) 2.5-0.025 MG tablet TAKE 1 TABLET BY MOUTH 4 (FOUR) TIMES DAILY AS NEEDED FOR DIARRHEA OR LOOSE STOOLS.  ? donepezil (ARICEPT) 5 MG tablet Take 1 tablet (5 mg total) by mouth at bedtime.  ? doxycycline (VIBRA-TABS) 100 MG tablet Take 1 tablet (100 mg total) by mouth 2 (two) times daily. (Patient not taking: Reported on 12/08/2021)  ? Ensure (ENSURE) Take 237 mLs by mouth daily.  ? Fluticasone-Umeclidin-Vilant (TRELEGY ELLIPTA) 100-62.5-25 MCG/ACT AEPB Inhale 1 puff into the lungs daily.  ? Fluticasone-Umeclidin-Vilant (TRELEGY ELLIPTA) 100-62.5-25 MCG/ACT AEPB Inhale 1 puff into the lungs daily.  ? furosemide (LASIX) 40 MG tablet TAKE 1 TABLET BY MOUTH EVERY DAY  ? guaiFENesin (MUCINEX) 600  MG 12 hr tablet Take 600 mg by mouth 2 (two) times daily as needed for cough.  ? levothyroxine (SYNTHROID) 75 MCG tablet TAKE 1 TABLET (75 MCG TOTAL) BY MOUTH DAILY.  ? LORazepam (ATIVAN) 0.5 MG tablet Take 1 tablet (0.5 mg total) by mouth 2 (two) times daily as needed for anxiety.  ? mirtazapine (REMERON) 15 MG tablet Take half a tab po qhs  ? Multiple Vitamin (MULTIVITAMIN) tablet Take 1 tablet by mouth daily.  ? NON FORMULARY Diet: _____  Regular, ?___x___ NAS, ?_______Consistent Carbohydrate, ?_______NPO ?_____Other  ? OVER THE COUNTER MEDICATION Take 1 tablet by mouth 2 (two) times daily. Slippery elm  ? OXYGEN Inhale 4 L into the lungs continuous.  ? polyvinyl alcohol (LIQUIFILM TEARS) 1.4 % ophthalmic solution Place 1 drop into both eyes 3 (three) times daily as needed for dry eyes.  ? predniSONE (DELTASONE) 10 MG tablet TAKE 1 TABLET (10 MG TOTAL) BY MOUTH DAILY WITH BREAKFAST.  ? SANTYL ointment Apply topically daily.  ? tamsulosin (FLOMAX) 0.4 MG CAPS capsule Take 1 capsule (0.4 mg total) by mouth daily.  ? terbinafine (LAMISIL) 1 % cream Apply 1 application topically 2 (two) times daily.  ? ?No facility-administered encounter medications on file as of 12/20/2021.  ? ? ?Have you had any problems recently with your health? ? ?  ?Have you had any problems with your pharmacy? ? ?  ?What issues or side effects are you having with your medications? ? ?  ?What would you like me to pass along to Leata Mouse, CPP for them to help you with?  ? ?  ?What can we do to take care of you better? ? ?Care Gaps ?  ?AWV: done 11/17/21 ?Colonoscopy: aged out (not candidate per Dr Dennard Schaumann on 12/08/21 OV) ?DM Eye Exam: N/A ?DM Foot Exam: N/A ?Microalbumin: N/A ?HbgAIC: done 09/09/20 (5.7) ?DEXA:  N/A ?Mammogram: N/A ? ?  ?Star Rating Drugs: ?No Star Rating Drugs noted.  ? ? ?Future Appointments  ?Date Time Provider [place]  ?02/08/2022 10:00 AM [facility] PHARMACIST [facility]  ?11/23/2022 11:15 AM [facility]-NURSE HEALTH ADVISOR [facility]  ? ?Per chart review patient has been referred to Palliative Care. Unenrolled from [facility]. ? ?[employer], CCMA ?Clinical Pharmacist Assistant  ?[phone] ? ?

## 2021-12-24 ENCOUNTER — Other Ambulatory Visit: Payer: Self-pay | Admitting: Internal Medicine

## 2021-12-26 DIAGNOSIS — L89522 Pressure ulcer of left ankle, stage 2: Secondary | ICD-10-CM | POA: Diagnosis not present

## 2021-12-26 DIAGNOSIS — I739 Peripheral vascular disease, unspecified: Secondary | ICD-10-CM | POA: Diagnosis not present

## 2022-01-01 DIAGNOSIS — J449 Chronic obstructive pulmonary disease, unspecified: Secondary | ICD-10-CM | POA: Diagnosis not present

## 2022-01-01 DIAGNOSIS — Z515 Encounter for palliative care: Secondary | ICD-10-CM | POA: Diagnosis not present

## 2022-01-01 DIAGNOSIS — J9611 Chronic respiratory failure with hypoxia: Secondary | ICD-10-CM | POA: Diagnosis not present

## 2022-01-02 ENCOUNTER — Other Ambulatory Visit (HOSPITAL_COMMUNITY): Payer: Self-pay | Admitting: Podiatry

## 2022-01-02 ENCOUNTER — Other Ambulatory Visit: Payer: Self-pay | Admitting: Podiatry

## 2022-01-02 DIAGNOSIS — L89522 Pressure ulcer of left ankle, stage 2: Secondary | ICD-10-CM

## 2022-01-02 DIAGNOSIS — I739 Peripheral vascular disease, unspecified: Secondary | ICD-10-CM

## 2022-01-04 ENCOUNTER — Other Ambulatory Visit: Payer: Self-pay

## 2022-01-04 ENCOUNTER — Telehealth: Payer: Self-pay | Admitting: Internal Medicine

## 2022-01-04 MED ORDER — TRELEGY ELLIPTA 100-62.5-25 MCG/ACT IN AEPB: 1.0000 | INHALATION_SPRAY | Freq: Every day | RESPIRATORY_TRACT | 0 refills | Status: AC

## 2022-01-04 MED ORDER — ALBUTEROL SULFATE 108 (90 BASE) MCG/ACT IN AEPB: 2.0000 | INHALATION_SPRAY | RESPIRATORY_TRACT | 6 refills | Status: AC | PRN

## 2022-01-04 NOTE — Telephone Encounter (Signed)
Samples provided to patient and refill on albuterol inhaler sent.  ?Patients wife is concerned because [facility] does not have patients neb solution (proventil) in stock. She would like to know if the patient should increase using albuterol rescue inhaler if nebulizer solutions will be hard to get or if he should change solutions. [facility] is on back order [facility] proventil and duonebs.  ? ?Dr. Melvyn Novas please advise  ?

## 2022-01-04 NOTE — Telephone Encounter (Signed)
Called and notified Shirlee she voiced understanding. Nothing further needed ?

## 2022-01-04 NOTE — Telephone Encounter (Signed)
Ok have on hand albuterol hfa   2 pffs q 4 h prn ?

## 2022-01-07 ENCOUNTER — Other Ambulatory Visit: Payer: Self-pay | Admitting: Family Medicine

## 2022-01-08 NOTE — Telephone Encounter (Signed)
LOV 12/08/21 ?Last refill 12/07/21, #30, 2 refills ? ?Please review, thanks! ? ?

## 2022-01-11 ENCOUNTER — Other Ambulatory Visit (HOSPITAL_COMMUNITY): Payer: Self-pay | Admitting: Podiatry

## 2022-01-11 ENCOUNTER — Ambulatory Visit (HOSPITAL_COMMUNITY)
Admission: RE | Admit: 2022-01-11 | Discharge: 2022-01-11 | Disposition: A | Discharge status: Home / Self Care | Payer: PPO | Source: Ambulatory Visit | Attending: Podiatry | Admitting: Podiatry

## 2022-01-11 DIAGNOSIS — I739 Peripheral vascular disease, unspecified: Secondary | ICD-10-CM | POA: Insufficient documentation

## 2022-01-11 DIAGNOSIS — L89522 Pressure ulcer of left ankle, stage 2: Secondary | ICD-10-CM | POA: Diagnosis not present

## 2022-01-11 IMAGING — US US ANKLE/BRACHIAL INDICES
2 series · 13 of 25 positions shown · non-contrast
Comparison: None.

CLINICAL DATA: Peripheral vascular disease, left lateral ankle
pressure injury stage II. History of tobacco abuse

EXAM:
BILATERAL LOWER EXTREMITY ARTERIAL DUPLEX SCAN
TECHNIQUE: Gray-scale sonography as well as color Doppler and duplex ultrasound
was performed to evaluate the arteries of both lower extremities
including the common, superficial and profunda femoral arteries,
popliteal artery and calf arteries.

[Series 1: us arterial lower extremity duplex bilateral (non- · arterial · 12 of 152 slices shown (1 of 2)]
[im 1/152]
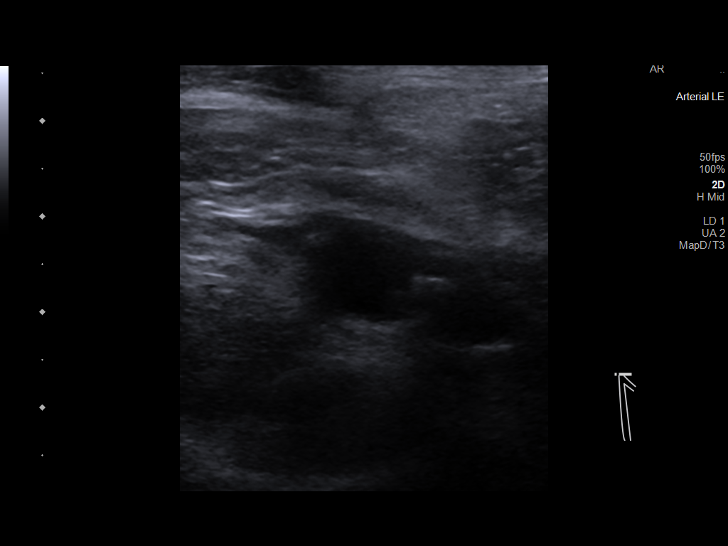
[im 14/152]
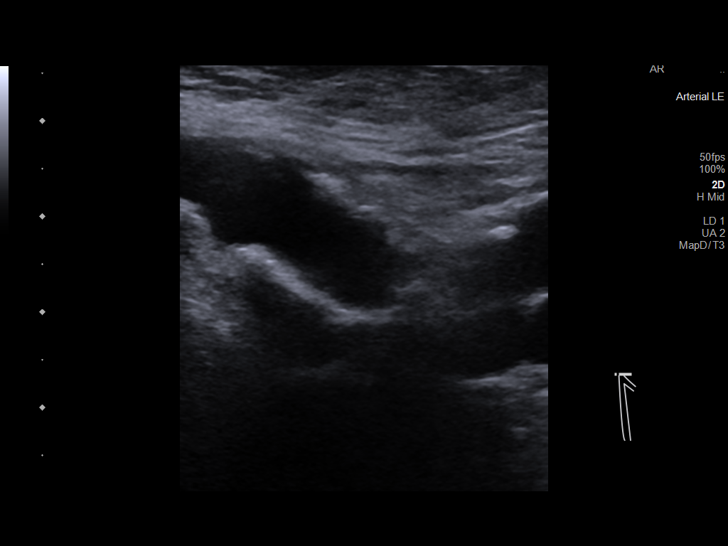
[im 28/152]
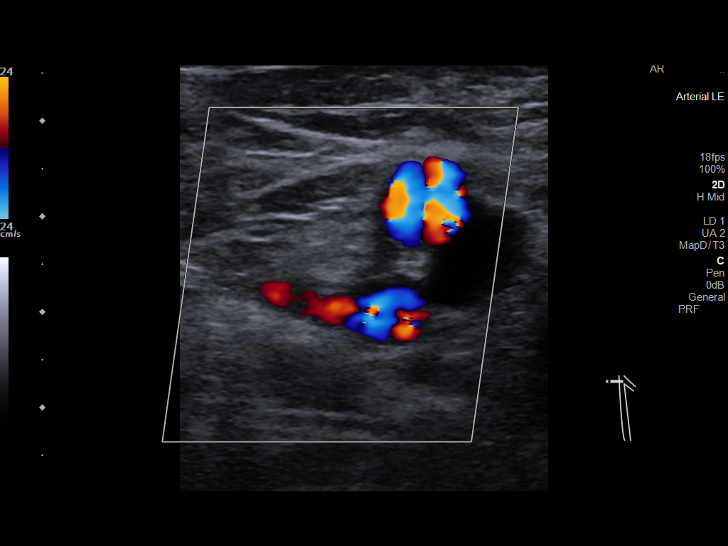
[im 42/152]
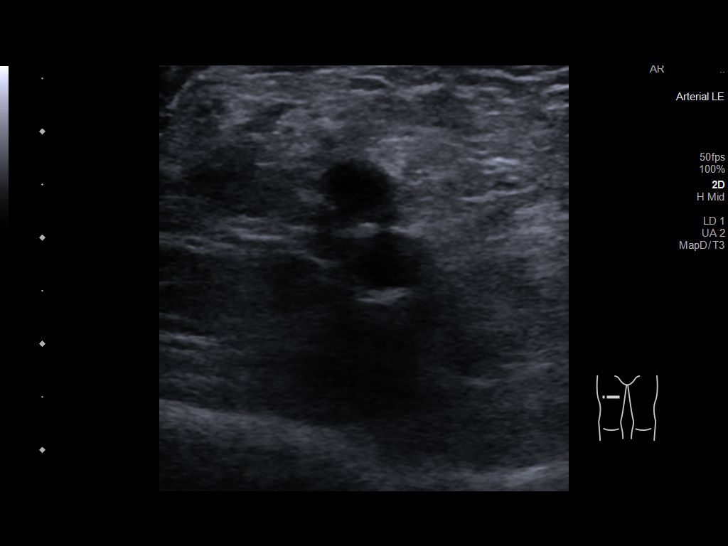
[im 55/152]
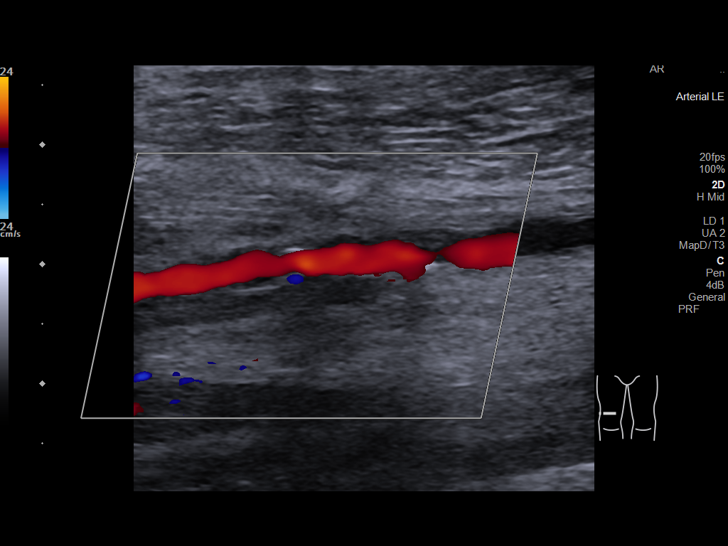
[im 69/152]
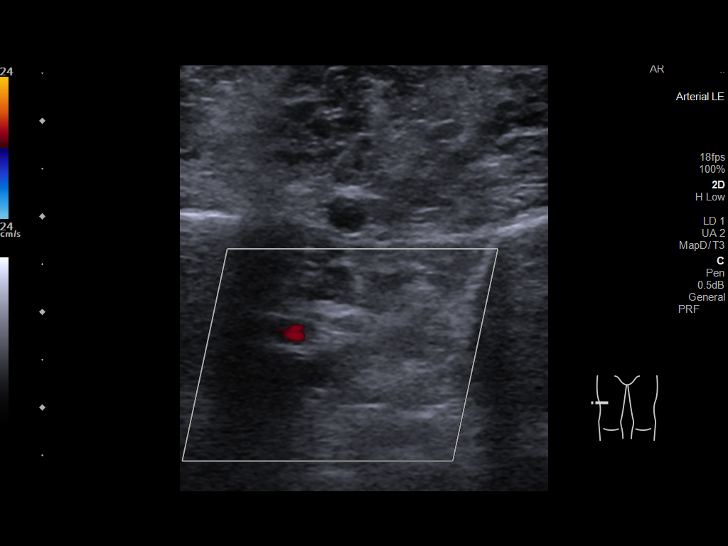
[im 83/152]
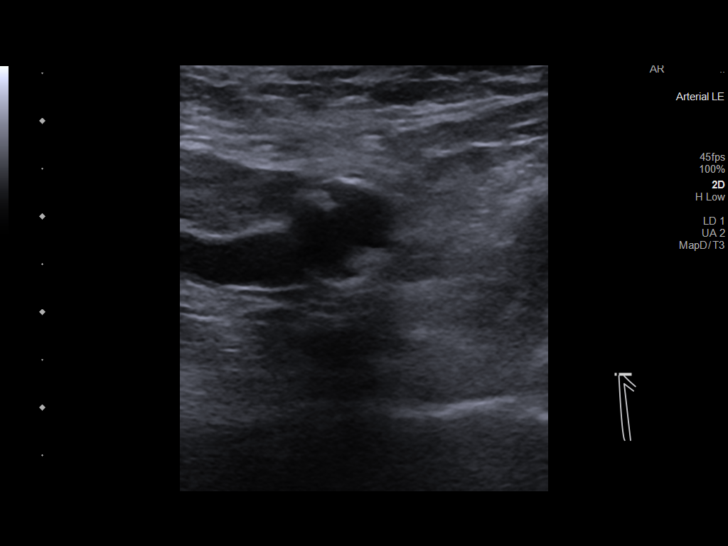
[im 97/152]
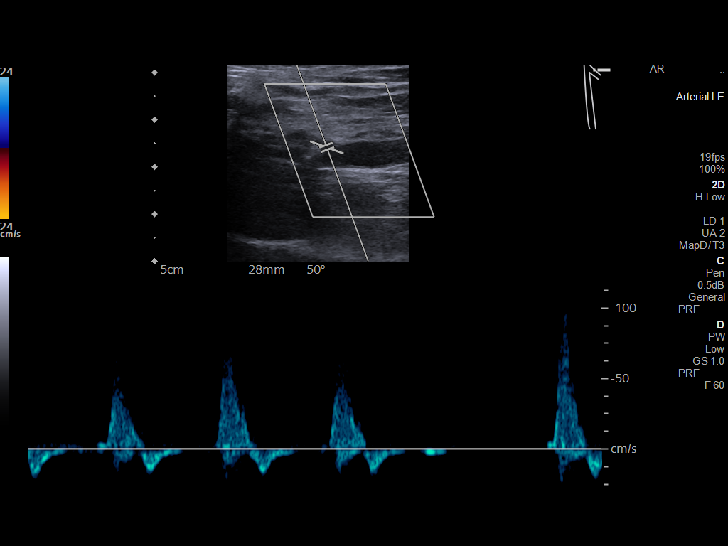
[im 110/152]
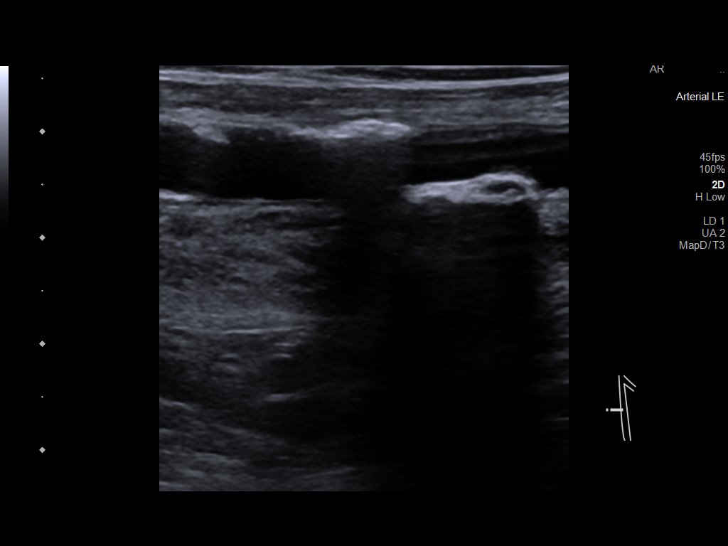
[im 124/152]
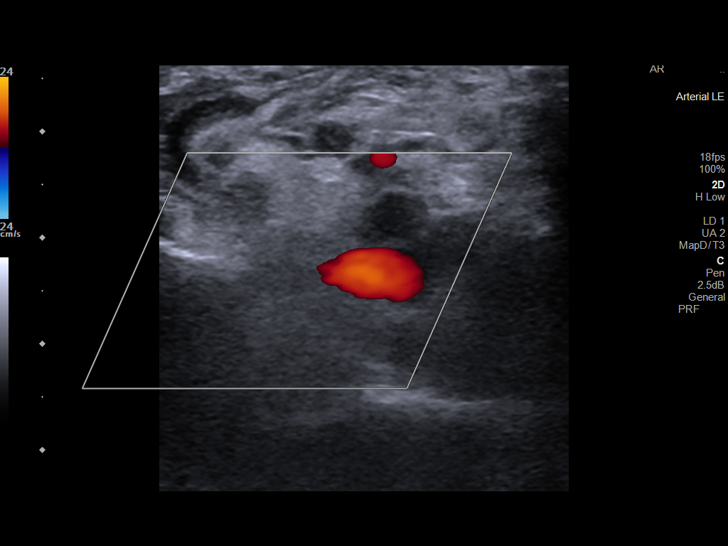
[im 138/152]
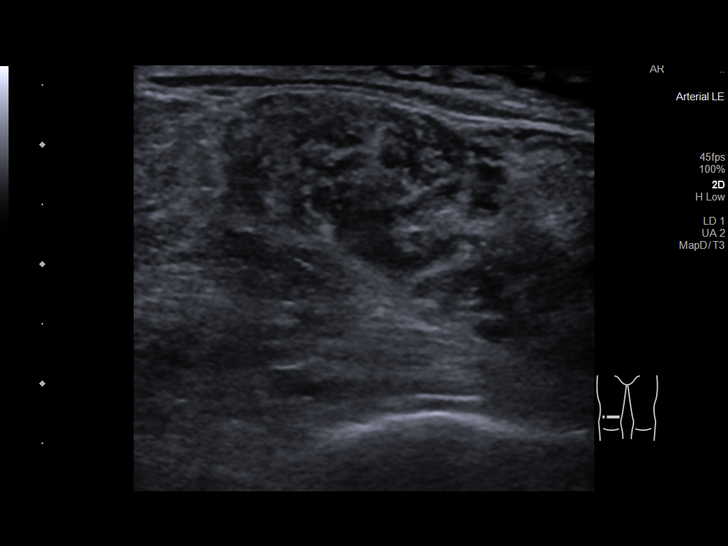
[im 152/152]
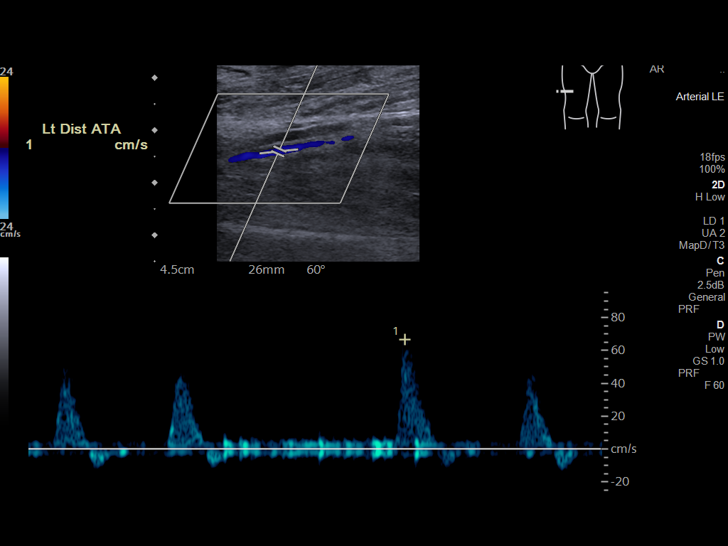

[Series 1001: us arterial lower extremity duplex bilateral (non- · arterial · 1 of 15 slices shown (2 of 2)]
[im 15/15]
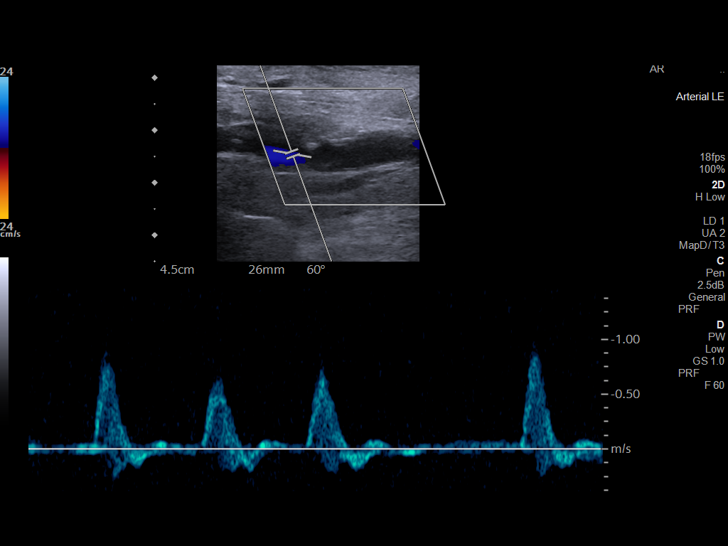

[13 of 25 positions shown; findings below may reference images not displayed]

FINDINGS: Right Lower Extremity

ABI:

Inflow: Normal common femoral arterial waveforms and velocities. No
evidence of inflow (aortoiliac) disease. Partially calcified plaque
in the distal common femoral artery resulting in at least mild
stenosis.

Outflow: Normal profunda femoral, superficial femoral and popliteal
arterial waveforms and velocities. No focal elevation of the PSV to
suggest stenosis. Eccentric nonocclusive plaque in the mid and
distal SFA, and popliteal artery.

Runoff: Normal posterior and anterior tibial arterial waveforms and
velocities. Vessels are patent to the ankle. Scattered calcified
tibial arterial plaque.

Left Lower Extremity

ABI:

Inflow: Normal common femoral arterial waveforms and velocities. No
evidence of inflow (aortoiliac) disease. Eccentric calcified plaque
in the distal common femoral artery resulting in at least mild
short-segment stenosis.

Outflow: Normal profunda femoral, superficial femoral and popliteal
arterial waveforms and velocities. No focal elevation of the PSV to
suggest stenosis. Eccentric nonocclusive plaque through the SFA and
popliteal artery.

Runoff: Normal posterior and anterior tibial arterial waveforms and
velocities. Vessels are patent to the ankle.
IMPRESSION: Mild right and borderline left lower extremity arterial occlusive
disease at rest.

No focal  occlusion or high-grade stenosis is identified.

## 2022-01-11 IMAGING — US US EXTREM LOW DUPLEX ARTERIAL BILAT
2 series · 13 of 25 positions shown · non-contrast
Comparison: None.

CLINICAL DATA: Peripheral vascular disease, left lateral ankle
pressure injury stage II. History of tobacco abuse

EXAM:
BILATERAL LOWER EXTREMITY ARTERIAL DUPLEX SCAN
TECHNIQUE: Gray-scale sonography as well as color Doppler and duplex ultrasound
was performed to evaluate the arteries of both lower extremities
including the common, superficial and profunda femoral arteries,
popliteal artery and calf arteries.

[Series 1: us arterial lower extremity duplex bilateral (non- · arterial · 12 of 152 slices shown (1 of 2)]
[im 1/152]
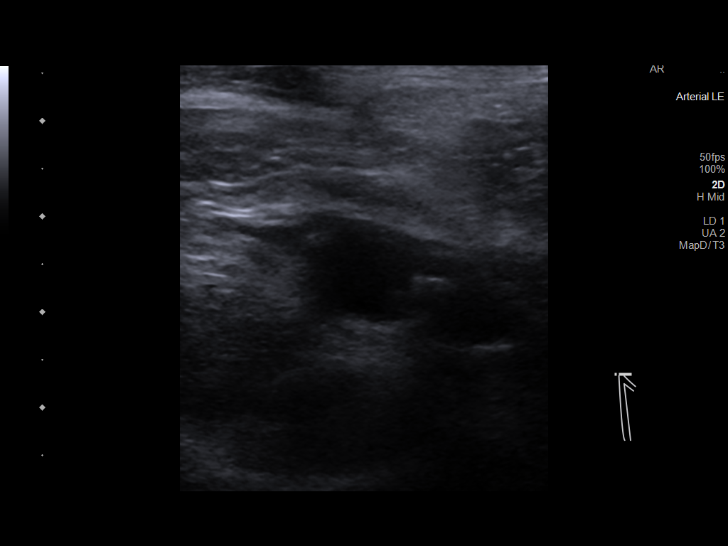
[im 14/152]
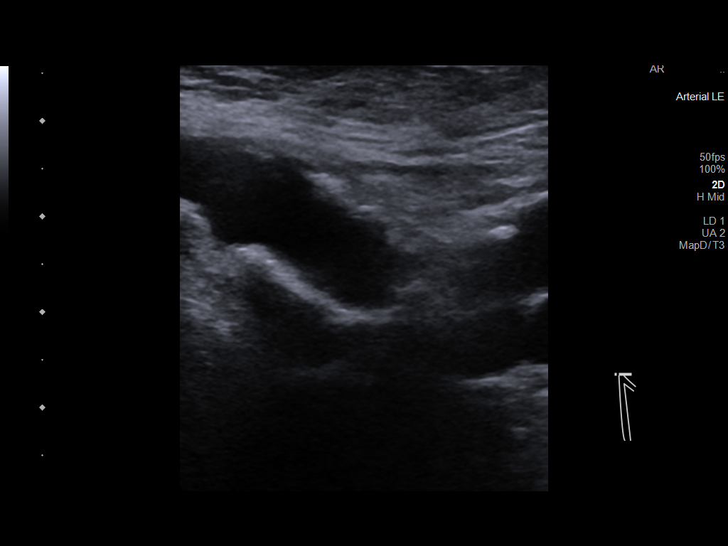
[im 28/152]
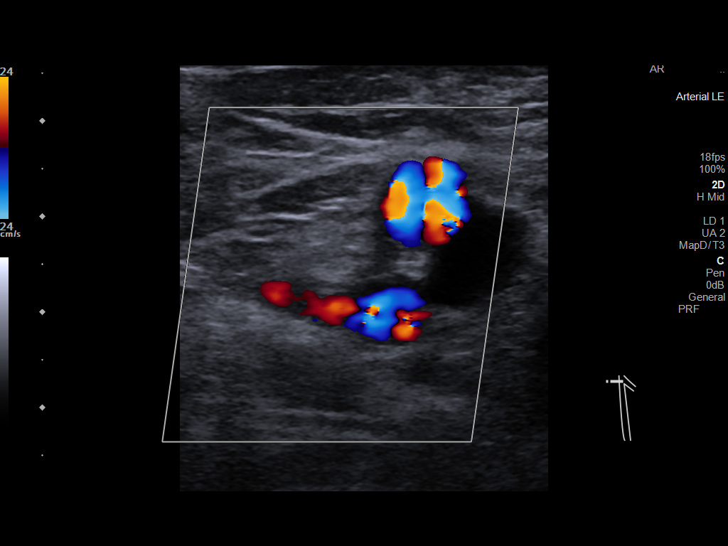
[im 42/152]
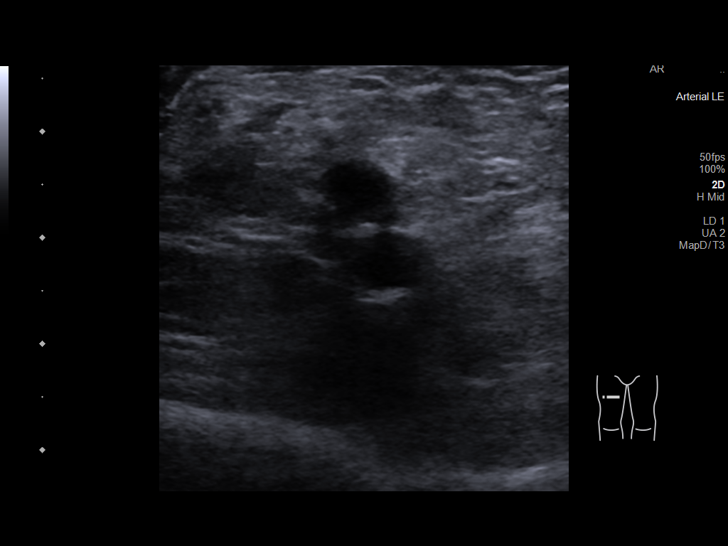
[im 55/152]
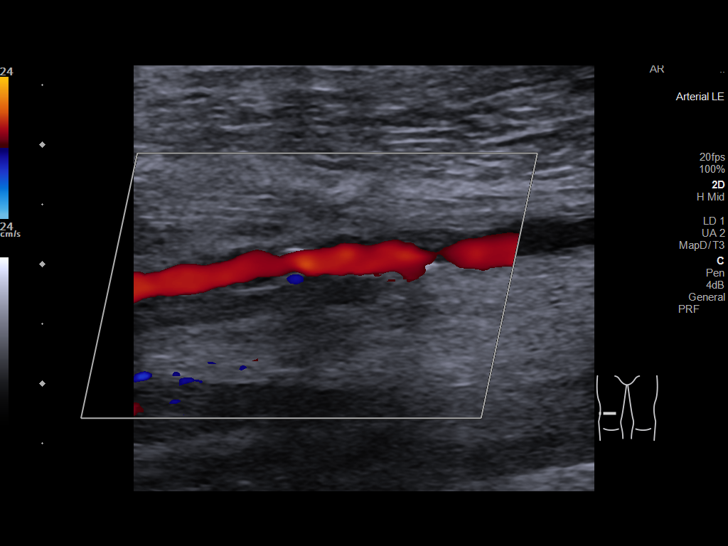
[im 69/152]
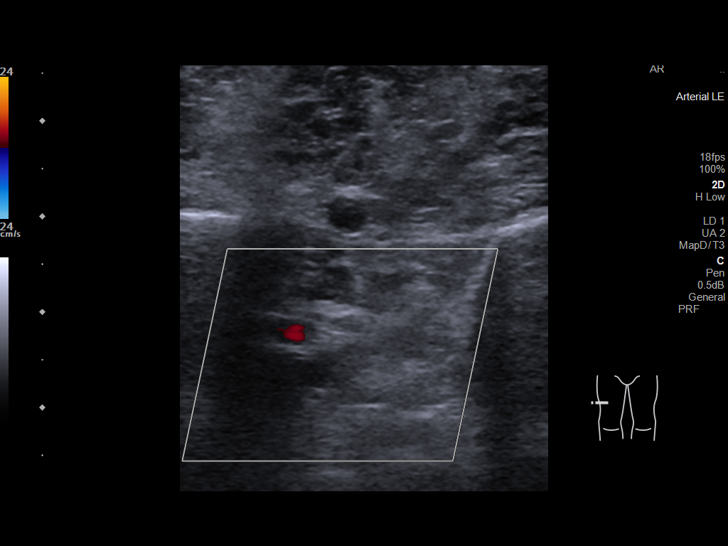
[im 83/152]
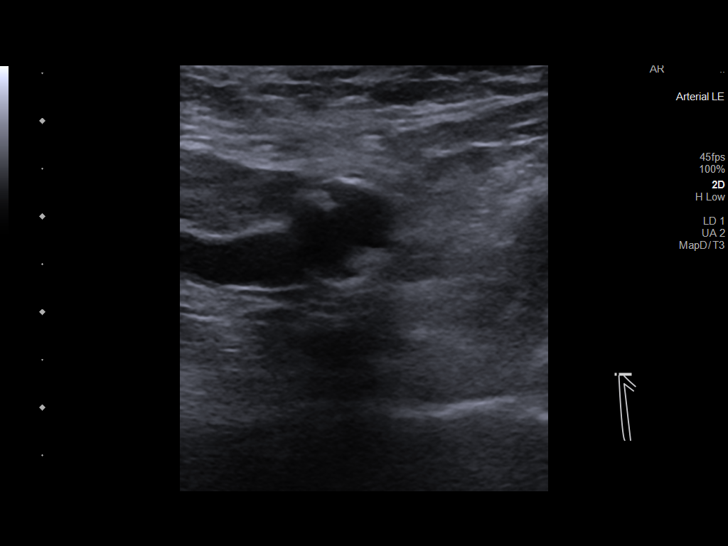
[im 97/152]
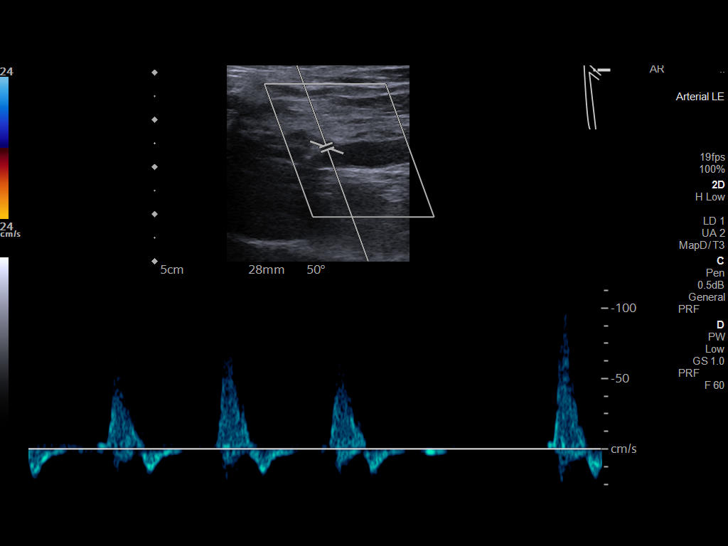
[im 110/152]
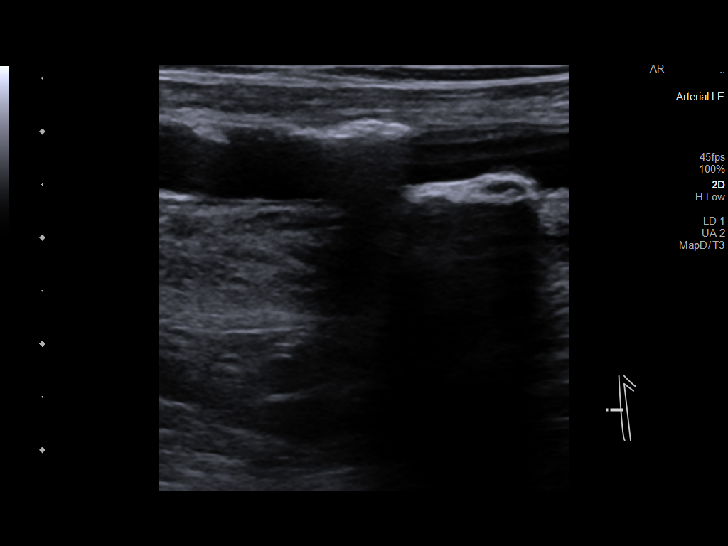
[im 124/152]
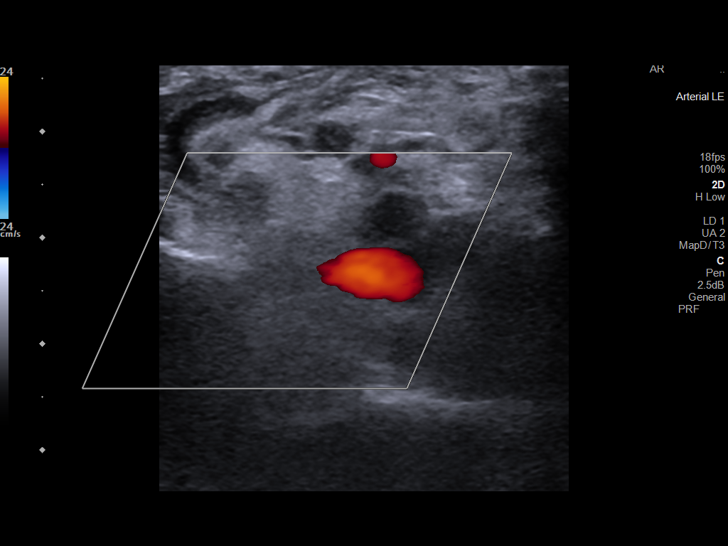
[im 138/152]
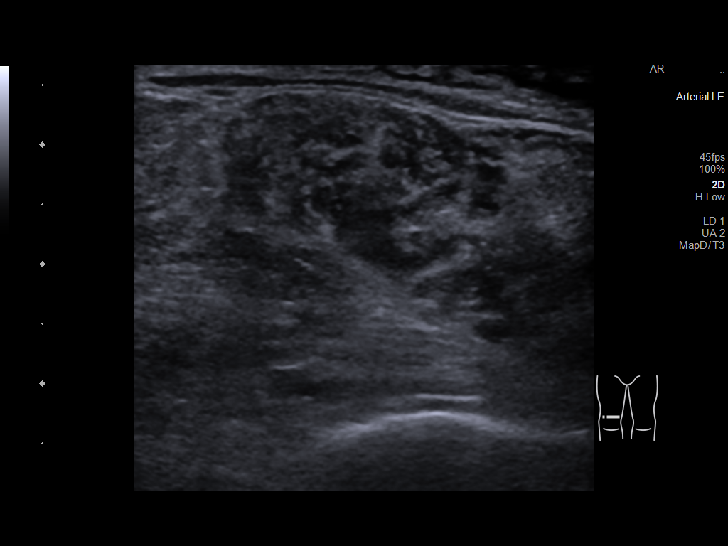
[im 152/152]
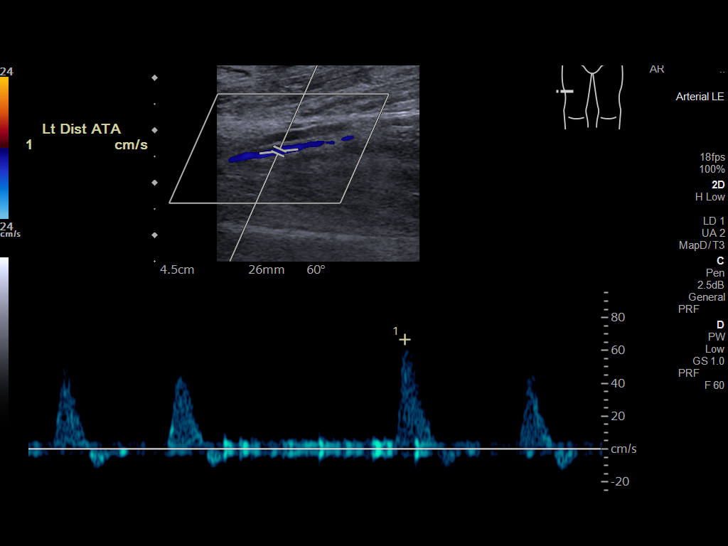

[Series 1001: us arterial lower extremity duplex bilateral (non- · arterial · 1 of 15 slices shown (2 of 2)]
[im 15/15]
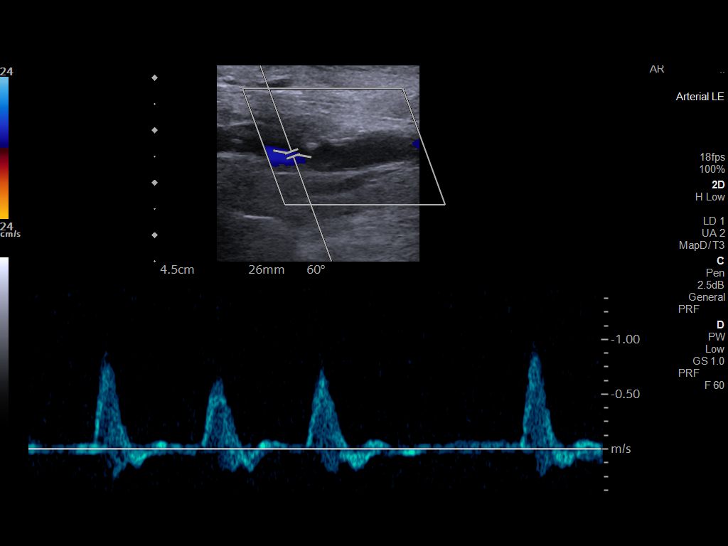

[13 of 25 positions shown; findings below may reference images not displayed]

FINDINGS: Right Lower Extremity

ABI:

Inflow: Normal common femoral arterial waveforms and velocities. No
evidence of inflow (aortoiliac) disease. Partially calcified plaque
in the distal common femoral artery resulting in at least mild
stenosis.

Outflow: Normal profunda femoral, superficial femoral and popliteal
arterial waveforms and velocities. No focal elevation of the PSV to
suggest stenosis. Eccentric nonocclusive plaque in the mid and
distal SFA, and popliteal artery.

Runoff: Normal posterior and anterior tibial arterial waveforms and
velocities. Vessels are patent to the ankle. Scattered calcified
tibial arterial plaque.

Left Lower Extremity

ABI:

Inflow: Normal common femoral arterial waveforms and velocities. No
evidence of inflow (aortoiliac) disease. Eccentric calcified plaque
in the distal common femoral artery resulting in at least mild
short-segment stenosis.

Outflow: Normal profunda femoral, superficial femoral and popliteal
arterial waveforms and velocities. No focal elevation of the PSV to
suggest stenosis. Eccentric nonocclusive plaque through the SFA and
popliteal artery.

Runoff: Normal posterior and anterior tibial arterial waveforms and
velocities. Vessels are patent to the ankle.
IMPRESSION: Mild right and borderline left lower extremity arterial occlusive
disease at rest.

No focal  occlusion or high-grade stenosis is identified.

## 2022-01-15 DIAGNOSIS — Z1283 Encounter for screening for malignant neoplasm of skin: Secondary | ICD-10-CM | POA: Diagnosis not present

## 2022-01-15 DIAGNOSIS — D225 Melanocytic nevi of trunk: Secondary | ICD-10-CM | POA: Diagnosis not present

## 2022-01-15 DIAGNOSIS — L570 Actinic keratosis: Secondary | ICD-10-CM | POA: Diagnosis not present

## 2022-01-15 DIAGNOSIS — X32XXXD Exposure to sunlight, subsequent encounter: Secondary | ICD-10-CM | POA: Diagnosis not present

## 2022-01-15 DIAGNOSIS — Z85820 Personal history of malignant melanoma of skin: Secondary | ICD-10-CM | POA: Diagnosis not present

## 2022-01-15 DIAGNOSIS — D045 Carcinoma in situ of skin of trunk: Secondary | ICD-10-CM | POA: Diagnosis not present

## 2022-01-15 DIAGNOSIS — Z08 Encounter for follow-up examination after completed treatment for malignant neoplasm: Secondary | ICD-10-CM | POA: Diagnosis not present

## 2022-01-18 DIAGNOSIS — I739 Peripheral vascular disease, unspecified: Secondary | ICD-10-CM | POA: Diagnosis not present

## 2022-01-18 DIAGNOSIS — L89522 Pressure ulcer of left ankle, stage 2: Secondary | ICD-10-CM | POA: Diagnosis not present

## 2022-01-22 DIAGNOSIS — R2681 Unsteadiness on feet: Secondary | ICD-10-CM | POA: Diagnosis not present

## 2022-01-22 DIAGNOSIS — J9611 Chronic respiratory failure with hypoxia: Secondary | ICD-10-CM | POA: Diagnosis not present

## 2022-01-22 DIAGNOSIS — J439 Emphysema, unspecified: Secondary | ICD-10-CM | POA: Diagnosis not present

## 2022-01-23 ENCOUNTER — Encounter (HOSPITAL_BASED_OUTPATIENT_CLINIC_OR_DEPARTMENT_OTHER): Payer: PPO | Admitting: General Surgery

## 2022-01-23 DIAGNOSIS — Z9981 Dependence on supplemental oxygen: Secondary | ICD-10-CM | POA: Insufficient documentation

## 2022-01-23 DIAGNOSIS — L89522 Pressure ulcer of left ankle, stage 2: Secondary | ICD-10-CM | POA: Diagnosis not present

## 2022-01-23 DIAGNOSIS — L89520 Pressure ulcer of left ankle, unstageable: Secondary | ICD-10-CM | POA: Diagnosis not present

## 2022-01-23 DIAGNOSIS — Z87891 Personal history of nicotine dependence: Secondary | ICD-10-CM | POA: Diagnosis not present

## 2022-01-23 DIAGNOSIS — X58XXXA Exposure to other specified factors, initial encounter: Secondary | ICD-10-CM | POA: Insufficient documentation

## 2022-01-23 DIAGNOSIS — Z7952 Long term (current) use of systemic steroids: Secondary | ICD-10-CM | POA: Diagnosis not present

## 2022-01-23 DIAGNOSIS — E038 Other specified hypothyroidism: Secondary | ICD-10-CM | POA: Diagnosis not present

## 2022-01-23 DIAGNOSIS — J449 Chronic obstructive pulmonary disease, unspecified: Secondary | ICD-10-CM | POA: Diagnosis not present

## 2022-01-23 DIAGNOSIS — J984 Other disorders of lung: Secondary | ICD-10-CM | POA: Diagnosis not present

## 2022-01-23 DIAGNOSIS — S72002A Fracture of unspecified part of neck of left femur, initial encounter for closed fracture: Secondary | ICD-10-CM | POA: Insufficient documentation

## 2022-01-25 NOTE — Progress Notes (Signed)
John Black, John Black ([id number]) ?Visit Report for 01/23/2022 ?Abuse Risk Screen Details ?Patient Name: Date of Service: ?John Black, John Black 01/23/2022 2:15 PM ?Medical Record Number: [id number] ?Patient Account Number: [id number] ?Date of Birth/Sex: Treating RN: ?31-Jan-1929 (88 y.o. Male) Levan Hurst ?Primary Care Shacara Cozine: Jenna Luo Other Clinician: ?Referring Huzaifa Viney: ?Treating Angelicia Lessner/Extender: Fredirick Maudlin ?Jenna Luo ?Weeks in Treatment: 0 ?Abuse Risk Screen Items ?Answer ?ABUSE RISK SCREEN: ?Has anyone close to you tried to hurt or harm you recentlyo No ?Do you feel uncomfortable with anyone in your familyo No ?Has anyone forced you do things that you didnt want to doo No ?Electronic Signature(s) ?Signed: 01/25/2022 5:21:10 PM By: Levan Hurst RN, BSN ?Entered By: Levan Hurst on 01/23/2022 15:28:19 ?-------------------------------------------------------------------------------- ?Activities of Daily Living Details ?Patient Name: Date of Service: ?John Black, John Black 01/23/2022 2:15 PM ?Medical Record Number: [id number] ?Patient Account Number: [id number] ?Date of Birth/Sex: Treating RN: ?[birth year] (88 y.o. Male) Levan Hurst ?Primary Care Kian Ottaviano: Jenna Luo Other Clinician: ?Referring Jaxsin Bottomley: ?Treating Braylea Brancato/Extender: Fredirick Maudlin ?Jenna Luo ?Weeks in Treatment: 0 ?Activities of Daily Living Items ?Answer ?Activities of Daily Living (Please select one for each item) ?Drive Automobile Not Able ?T Medications ?ake Need Assistance ?Use T elephone Need Assistance ?Care for Appearance Need Assistance ?Use T oilet Need Assistance ?Bath / Shower Need Assistance ?Dress Self Need Assistance ?Feed Self Completely Able ?Walk Need Assistance ?Get In / Out Bed Need Assistance ?Housework Need Assistance ?Prepare Meals Not Able ?Handle Money Need Assistance ?Shop for Self Need Assistance ?Electronic Signature(s) ?Signed: 01/25/2022 5:21:10 PM By: Levan Hurst RN, BSN ?Entered  By: Levan Hurst on 01/23/2022 15:29:16 ?-------------------------------------------------------------------------------- ?Education Screening Details ?Patient Name: ?Date of Service: ?John Black, John Black 01/23/2022 2:15 PM ?Medical Record Number: [id number] ?Patient Account Number: [id number] ?Date of Birth/Sex: ?Treating RN: ?01/20/1929 (88 y.o. Male) Levan Hurst ?Primary Care Ikram Riebe: Jenna Luo ?Other Clinician: ?Referring Tyffany Waldrop: ?Treating David Towson/Extender: Fredirick Maudlin ?Jenna Luo ?Weeks in Treatment: 0 ?Primary Learner Assessed: Patient ?Learning Preferences/Education Level/Primary Language ?Learning Preference: Explanation, Demonstration, Printed Material ?Highest Education Level: High School ?Preferred Language: English ?Cognitive Barrier ?Language Barrier: No ?Translator Needed: No ?Memory Deficit: No ?Emotional Barrier: No ?Cultural/Religious Beliefs Affecting Medical Care: No ?Physical Barrier ?Impaired Vision: No ?Impaired Hearing: No ?Decreased Hand dexterity: No ?Knowledge/Comprehension ?Knowledge Level: High ?Comprehension Level: High ?Ability to understand written instructions: High ?Ability to understand verbal instructions: High ?Motivation ?Anxiety Level: Calm ?Cooperation: Cooperative ?Education Importance: Acknowledges Need ?Interest in Health Problems: Asks Questions ?Perception: Coherent ?Willingness to Engage in Self-Management High ?Activities: ?Readiness to Engage in Self-Management High ?Activities: ?Electronic Signature(s) ?Signed: 01/25/2022 5:21:10 PM By: Levan Hurst RN, BSN ?Entered By: Levan Hurst on 01/23/2022 15:29:34 ?-------------------------------------------------------------------------------- ?Fall Risk Assessment Details ?Patient Name: ?Date of Service: ?John Black, John Black 01/23/2022 2:15 PM ?Medical Record Number: [id number] ?Patient Account Number: [id number] ?Date of Birth/Sex: ?Treating RN: ?08-Nov-1928 (88 y.o. Male) Levan Hurst ?Primary Care  Tanush Drees: Jenna Luo ?Other Clinician: ?Referring Malak Orantes: ?Treating Orvis Stann/Extender: Fredirick Maudlin ?Jenna Luo ?Weeks in Treatment: 0 ?Fall Risk Assessment Items ?Have you had 2 or more falls in the last 12 monthso 0 Yes ?Have you had any fall that resulted in injury in the last 12 monthso 0 No ?FALLS RISK SCREEN ?History of falling - immediate or within 3 months 0 No ?Secondary diagnosis (Do you have 2 or more medical diagnoseso) 15 Yes ?Ambulatory aid ?None/bed rest/wheelchair/nurse 0 Yes ?Crutches/cane/walker 0 No ?Furniture 0 No ?Intravenous therapy Access/Saline/Heparin Lock 0 No ?Gait/Transferring ?Normal/ bed rest/ wheelchair 0 No ?Weak (short steps with  or without shuffle, stooped but able to lift head while walking, may seek 10 Yes ?support from furniture) ?Impaired (short steps with shuffle, may have difficulty arising from chair, head down, impaired 0 No ?balance) ?Mental Status ?Oriented to own ability 0 Yes ?Electronic Signature(s) ?Signed: 01/25/2022 5:21:10 PM By: Levan Hurst RN, BSN ?Entered By: Levan Hurst on 01/23/2022 15:29:46 ?-------------------------------------------------------------------------------- ?Foot Assessment Details ?Patient Name: ?Date of Service: ?John Black, John Black 01/23/2022 2:15 PM ?Medical Record Number: [id number] ?Patient Account Number: [id number] ?Date of Birth/Sex: ?Treating RN: ?04-Aug-1929 (88 y.o. Male) Levan Hurst ?Primary Care Onis Markoff: Jenna Luo ?Other Clinician: ?Referring Vallerie Hentz: ?Treating Ej Pinson/Extender: Fredirick Maudlin ?Jenna Luo ?Weeks in Treatment: 0 ?Foot Assessment Items ?Site Locations ?+ = Sensation present, - = Sensation absent, C = Callus, U = Ulcer ?R = Redness, W = Warmth, M = Maceration, PU = Pre-ulcerative lesion ?F = Fissure, S = Swelling, D = Dryness ?Assessment ?Right: Left: ?Other Deformity: No No ?Prior Foot Ulcer: No No ?Prior Amputation: No No ?Charcot Joint: No No ?Ambulatory Status: Ambulatory With  Help ?Gait: Unsteady ?Electronic Signature(s) ?Signed: 01/25/2022 5:21:10 PM By: Levan Hurst RN, BSN ?Entered By: Levan Hurst on 01/23/2022 15:35:05 ?-------------------------------------------------------------------------------- ?Nutrition Risk Screening Details ?Patient Name: ?Date of Service: ?John Black, John Black 01/23/2022 2:15 PM ?Medical Record Number: [id number] ?Patient Account Number: [id number] ?Date of Birth/Sex: ?Treating RN: ?04-Apr-1929 (88 y.o. Male) Levan Hurst ?Primary Care Lachina Salsberry: Jenna Luo ?Other Clinician: ?Referring Boss Danielsen: ?Treating Shaquira Moroz/Extender: Fredirick Maudlin ?Jenna Luo ?Weeks in Treatment: 0 ?Height (in): 64 ?Weight (lbs): 118 ?Body Mass Index (BMI): 20.3 ?Nutrition Risk Screening Items ?Score Screening ?NUTRITION RISK SCREEN: ?I have an illness or condition that made me change the kind and/or amount of food I eat 0 No ?I eat fewer than two meals per day 0 No ?I eat few fruits and vegetables, or milk products 0 No ?I have three or more drinks of beer, liquor or wine almost every day 0 No ?I have tooth or mouth problems that make it hard for me to eat 0 No ?I don't always have enough money to buy the food I need 0 No ?I eat alone most of the time 0 No ?I take three or more different prescribed or over-the-counter drugs a day 1 Yes ?Without wanting to, I have lost or gained 10 pounds in the last six months 0 No ?I am not always physically able to shop, cook and/or feed myself 2 Yes ?Nutrition Protocols ?Good Risk Protocol ?Moderate Risk Protocol 0 Provide education on nutrition ?High Risk Proctocol ?Risk Level: Moderate Risk ?Score: 3 ?Electronic Signature(s) ?Signed: 01/25/2022 5:21:10 PM By: Levan Hurst RN, BSN ?Entered By: Levan Hurst on 01/23/2022 15:30:01 ?

## 2022-01-25 NOTE — Progress Notes (Signed)
John Black, John Black ([id number]) ?Visit Report for 01/23/2022 ?Allergy List Details ?Patient Name: Date of Service: ?John Black, John Black 01/23/2022 2:15 PM ?Medical Record Number: [id number] ?Patient Account Number: [id number] ?Date of Birth/Sex: Treating RN: ?October 28, 1928 (88 y.o. Male) Levan Hurst ?Primary Care Elysse Polidore: Jenna Luo Other Clinician: ?Referring Lajoy Vanamburg: ?Treating Benjerman Molinelli/Extender: Fredirick Maudlin ?Jenna Luo ?Weeks in Treatment: 0 ?Allergies ?Active Allergies ?morphine ?Reaction: sweating ?Allergy Notes ?Electronic Signature(s) ?Signed: 01/25/2022 5:21:10 PM By: Levan Hurst RN, BSN ?Entered By: Levan Hurst on 01/23/2022 15:24:54 ?-------------------------------------------------------------------------------- ?Arrival Information Details ?Patient Name: Date of Service: ?John Black, John Black 01/23/2022 2:15 PM ?Medical Record Number: [id number] ?Patient Account Number: [id number] ?Date of Birth/Sex: Treating RN: ?01/09/1929 (88 y.o. Male) Levan Hurst ?Primary Care Walburga Hudman: Jenna Luo Other Clinician: ?Referring Rayssa Atha: ?Treating Roslin Norwood/Extender: Fredirick Maudlin ?Jenna Luo ?Weeks in Treatment: 0 ?Visit Information ?Patient Arrived: Wheel Chair ?Arrival Time: 14:50 ?Accompanied By: daughter/wife ?Transfer Assistance: None ?Patient Identification Verified: Yes ?Secondary Verification Process Completed: Yes ?Patient Requires Transmission-Based Precautions: No ?Patient Has Alerts: Yes ?Patient Alerts: ?R ABI: 0.85 ?L ABI: 0.99 ?Electronic Signature(s) ?Signed: 01/25/2022 5:21:10 PM By: Levan Hurst RN, BSN ?Entered By: Levan Hurst on 01/23/2022 17:11:23 ?-------------------------------------------------------------------------------- ?Clinic Level of Care Assessment Details ?Patient Name: Date of Service: ?John Black, John Black 01/23/2022 2:15 PM ?Medical Record Number: [id number] ?Patient Account Number: [id number] ?Date of Birth/Sex: Treating RN: ?04/29/1929 (88 y.o.  Male) Levan Hurst ?Primary Care Lummie Montijo: Jenna Luo Other Clinician: ?Referring Jozsef Wescoat: ?Treating Shreena Baines/Extender: Fredirick Maudlin ?Jenna Luo ?Weeks in Treatment: 0 ?Clinic Level of Care Assessment Items ?TOOL 1 Quantity Score ?X- 1 0 ?Use when EandM and Procedure is performed on INITIAL visit ?ASSESSMENTS - Nursing Assessment / Reassessment ?X- 1 20 ?General Physical Exam (combine w/ comprehensive assessment (listed just below) when performed on new pt. evals) ?X- 1 25 ?Comprehensive Assessment (HX, ROS, Risk Assessments, Wounds Hx, etc.) ?ASSESSMENTS - Wound and Skin Assessment / Reassessment ?'[]'$  - 0 ?Dermatologic / Skin Assessment (not related to wound area) ?ASSESSMENTS - Ostomy and/or Continence Assessment and Care ?'[]'$  - 0 ?Incontinence Assessment and Management ?'[]'$  - 0 ?Ostomy Care Assessment and Management (repouching, etc.) ?PROCESS - Coordination of Care ?X - Simple Patient / Family Education for ongoing care 1 15 ?'[]'$  - 0 ?Complex (extensive) Patient / Family Education for ongoing care ?X- 1 10 ?Staff obtains Consents, Records, T Results / Process Orders ?est ?'[]'$  - 0 ?Staff telephones HHA, Nursing Homes / Clarify orders / etc ?'[]'$  - 0 ?Routine Transfer to another Facility (non-emergent condition) ?'[]'$  - 0 ?Routine Hospital Admission (non-emergent condition) ?X- 1 15 ?New Admissions / Biomedical engineer / Ordering NPWT Apligraf, etc. ?, ?'[]'$  - 0 ?Emergency Hospital Admission (emergent condition) ?PROCESS - Special Needs ?'[]'$  - 0 ?Pediatric / Minor Patient Management ?'[]'$  - 0 ?Isolation Patient Management ?'[]'$  - 0 ?Hearing / Language / Visual special needs ?'[]'$  - 0 ?Assessment of Community assistance (transportation, D/C planning, etc.) ?'[]'$  - 0 ?Additional assistance / Altered mentation ?'[]'$  - 0 ?Support Surface(s) Assessment (bed, cushion, seat, etc.) ?INTERVENTIONS - Miscellaneous ?'[]'$  - 0 ?External ear exam ?'[]'$  - 0 ?Patient Transfer (multiple staff / Civil Service fast streamer / Similar devices) ?'[]'$  -  0 ?Simple Staple / Suture removal (25 or less) ?'[]'$  - 0 ?Complex Staple / Suture removal (26 or more) ?'[]'$  - 0 ?Hypo/Hyperglycemic Management (do not check if billed separately) ?'[]'$  - 0 ?Ankle / Brachial Index (ABI) - do not check if billed separately ?Has the patient been seen at the hospital within the last three years: Yes ?  Total Score: 85 ?Level Of Care: New/Established - Level 3 ?Electronic Signature(s) ?Signed: 01/25/2022 5:21:10 PM By: Levan Hurst RN, BSN ?Entered By: Levan Hurst on 01/23/2022 17:08:45 ?-------------------------------------------------------------------------------- ?Encounter Discharge Information Details ?Patient Name: ?Date of Service: ?John Black, John Black 01/23/2022 2:15 PM ?Medical Record Number: [id number] ?Patient Account Number: [id number] ?Date of Birth/Sex: ?Treating RN: ?1929-09-06 (88 y.o. Male) Levan Hurst ?Primary Care Katty Fretwell: Jenna Luo ?Other Clinician: ?Referring Antonisha Waskey: ?Treating Cael Worth/Extender: Fredirick Maudlin ?Jenna Luo ?Weeks in Treatment: 0 ?Encounter Discharge Information Items Post Procedure Vitals ?Discharge Condition: Stable ?Temperature (F): 98.3 ?Ambulatory Status: Wheelchair ?Pulse (bpm): 61 ?Discharge Destination: Home ?Respiratory Rate (breaths/min): 18 ?Transportation: Private Auto ?Blood Pressure (mmHg): 127/72 ?Accompanied By: wife and daughter ?Schedule Follow-up Appointment: Yes ?Clinical Summary of Care: Patient Declined ?Electronic Signature(s) ?Signed: 01/25/2022 5:21:10 PM By: Levan Hurst RN, BSN ?Entered By: Levan Hurst on 01/23/2022 17:10:03 ?-------------------------------------------------------------------------------- ?Lower Extremity Assessment Details ?Patient Name: ?Date of Service: ?John Black, John Black 01/23/2022 2:15 PM ?Medical Record Number: [id number] ?Patient Account Number: [id number] ?Date of Birth/Sex: ?Treating RN: ?10/05/1928 (88 y.o. Male) Levan Hurst ?Primary Care Dalyla Chui: Jenna Luo ?Other  Clinician: ?Referring Lucindia Lemley: ?Treating Elihu Milstein/Extender: Fredirick Maudlin ?Jenna Luo ?Weeks in Treatment: 0 ?Edema Assessment ?Assessed: [Left: No] [Right: No] ?Edema: [Left: Ye] [Right: s] ?Calf ?Left: Right: ?Point of Measurement: From Medial Instep 29 cm ?Ankle ?Left: Right: ?Point of Measurement: From Medial Instep 21 cm ?Vascular Assessment ?Pulses: ?Dorsalis Pedis ?Palpable: [Left:Yes] ?Electronic Signature(s) ?Signed: 01/25/2022 5:21:10 PM By: Levan Hurst RN, BSN ?Entered By: Levan Hurst on 01/23/2022 15:31:24 ?-------------------------------------------------------------------------------- ?Multi Wound Chart Details ?Patient Name: ?Date of Service: ?John Black, John Black 01/23/2022 2:15 PM ?Medical Record Number: [id number] ?Patient Account Number: [id number] ?Date of Birth/Sex: ?Treating RN: ?03/19/1929 (88 y.o. Male) Levan Hurst ?Primary Care Marylouise Mallet: Jenna Luo ?Other Clinician: ?Referring Gazella Anglin: ?Treating Isbella Arline/Extender: Fredirick Maudlin ?Jenna Luo ?Weeks in Treatment: 0 ?Vital Signs ?Height(in): 64 ?Pulse(bpm): 61 ?Weight(lbs): 118 ?Blood Pressure(mmHg): 127/72 ?Body Mass Index(BMI): 20.3 ?Temperature(??F): 98.3 ?Respiratory Rate(breaths/min): 18 ?Photos: [N/A:N/A] ?Left, Lateral Malleolus N/A N/A ?Wound Location: ?Gradually Appeared N/A N/A ?Wounding Event: ?Pressure Ulcer N/A N/A ?Primary Etiology: ?Chronic Obstructive Pulmonary N/A N/A ?Comorbid History: ?Disease (COPD), Osteoarthritis ?01/16/2020 N/A N/A ?Date Acquired: ?0 N/A N/A ?Weeks of Treatment: ?Open N/A N/A ?Wound Status: ?No N/A N/A ?Wound Recurrence: ?0.5x0.3x0.1 N/A N/A ?Measurements L x W x D (cm) ?0.118 N/A N/A ?A (cm?) : ?rea ?0.012 N/A N/A ?Volume (cm?) : ?0.00% N/A N/A ?% Reduction in A rea: ?0.00% N/A N/A ?% Reduction in Volume: ?Unstageable/Unclassified N/A N/A ?Classification: ?Medium N/A N/A ?Exudate A mount: ?Serous N/A N/A ?Exudate Type: ?amber N/A N/A ?Exudate Color: ?Flat and Intact N/A  N/A ?Wound Margin: ?None Present (0%) N/A N/A ?Granulation A mount: ?Large (67-100%) N/A N/A ?Necrotic A mount: ?Fat Layer (Subcutaneous Tissue): Yes N/A N/A ?Exposed Structures: ?Fascia: No ?Tendon: No ?Muscle: No ?Joint

## 2022-01-25 NOTE — Progress Notes (Signed)
YECHESKEL, KUREK ([id number]) ?Visit Report for 01/23/2022 ?Chief Complaint Document Details ?Patient Name: Date of Service: ?John Black, John Black 01/23/2022 2:15 PM ?Medical Record Number: [id number] ?Patient Account Number: [id number] ?Date of Birth/Sex: Treating RN: ?[birth year] (88 y.o. Male) Levan Hurst ?Primary Care Provider: Jenna Luo Other Clinician: ?Referring Provider: ?Treating Provider/Extender: Fredirick Maudlin ?Jenna Luo ?Weeks in Treatment: 0 ?Information Obtained from: Patient ?Chief Complaint ?Patient is at the clinic for treatment of an open pressure ulcer of his left lateral malleolus ?Electronic Signature(s) ?Signed: 01/23/2022 4:04:45 PM By: Fredirick Maudlin MD FACS ?Entered By: Fredirick Maudlin on 01/23/2022 16:04:45 ?-------------------------------------------------------------------------------- ?Debridement Details ?Patient Name: Date of Service: ?John Black, John Black 01/23/2022 2:15 PM ?Medical Record Number: [id number] ?Patient Account Number: [id number] ?Date of Birth/Sex: Treating RN: ?18-Nov-1928 (88 y.o. Male) Levan Hurst ?Primary Care Provider: Jenna Luo Other Clinician: ?Referring Provider: ?Treating Provider/Extender: Fredirick Maudlin ?Jenna Luo ?Weeks in Treatment: 0 ?Debridement Performed for Assessment: Wound #1 Left,Lateral Malleolus ?Performed By: Physician Fredirick Maudlin, MD ?Debridement Type: Debridement ?Level of Consciousness (Pre-procedure): Awake and Alert ?Pre-procedure Verification/Time Out Yes - 15:45 ?Taken: ?Start Time: 15:45 ?T Area Debrided (L x W): ?otal 0.5 (cm) x 0.3 (cm) = 0.15 (cm?) ?Tissue and other material debrided: Non-Viable, [place], [place] ?Level: Non-Viable Tissue ?Debridement Description: Selective/Open Wound ?Instrument: Curette ?Bleeding: Minimum ?Hemostasis Achieved: Pressure ?End Time: 15:46 ?Procedural Pain: 4 ?Post Procedural Pain: 3 ?Response to Treatment: Procedure was tolerated well ?Level of Consciousness (Post- Awake  and Alert ?procedure): ?Post Debridement Measurements of Total Wound ?Length: (cm) 0.5 ?Stage: Unstageable/Unclassified ?Width: (cm) 0.3 ?Depth: (cm) 0.1 ?Volume: (cm?) 0.012 ?Character of Wound/Ulcer Post Debridement: Requires Further Debridement ?Post Procedure Diagnosis ?Same as Pre-procedure ?Electronic Signature(s) ?Signed: 01/23/2022 4:17:55 PM By: Fredirick Maudlin MD FACS ?Signed: 01/25/2022 5:21:10 PM By: Levan Hurst RN, BSN ?Entered By: Levan Hurst on 01/23/2022 15:47:22 ?-------------------------------------------------------------------------------- ?HPI Details ?Patient Name: Date of Service: ?John Black, John Black 01/23/2022 2:15 PM ?Medical Record Number: [id number] ?Patient Account Number: [id number] ?Date of Birth/Sex: Treating RN: ?05/07/1929 (88 y.o. Male) Levan Hurst ?Primary Care Provider: Jenna Luo Other Clinician: ?Referring Provider: ?Treating Provider/Extender: Fredirick Maudlin ?Jenna Luo ?Weeks in Treatment: 0 ?History of Present Illness ?HPI Description: ADMISSION ?01/23/2022 ?This is a 88 year old man who suffered a hip fracture in 2022. During the time in his rehabilitation facility, he developed a small pressure sore on his left lateral ?ankle. He has been treated at an outside podiatrist for over a year without any significant healing. He is not diabetic. He is oxygen dependent secondary to ?COPD and also takes prednisone for his lung disease. He quit smoking quite some time ago. ABI performed at the [facility] vascular lab demonstrated a ?right ABI of 0.85 and a left ABI of 0.99. ?There is a small ulcer on his left lateral malleolus. It is circular and has some slough at the base. Apparently just last week, the podiatrist they were seeing ?changed him from Aquac[facility] to [facility]. He has specialty stockings that have padding over the lateral and medial malleoli and he is wearing these at night. ?Electronic Signature(s) ?Signed: 01/23/2022 4:07:29 PM By: Fredirick Maudlin MD  FACS ?Entered By: Fredirick Maudlin on 01/23/2022 16:07:28 ?-------------------------------------------------------------------------------- ?Physical Exam Details ?Patient Name: Date of Service: ?John Black, John Black 01/23/2022 2:15 PM ?Medical Record Number: [id number] ?Patient Account Number: [id number] ?Date of Birth/Sex: Treating RN: ?1929/04/01 (88 y.o. Male) Levan Hurst ?Primary Care Provider: Jenna Luo Other Clinician: ?Referring Provider: ?Treating Provider/Extender: Fredirick Maudlin ?Jenna Luo ?Weeks in Treatment: 0 ?Constitutional ?. . . .  No acute distress. ?Respiratory ?Normal work of breathing on supplemental oxygen. ?Cardiovascular ?No significant edema bilaterally.Marland Kitchen ?Notes ?01/23/2022: On the lateral left malleolus, there is a round ulcer with some pale yellow slough in the base. No periwound erythema or induration. No drainage or ?odor. ?Electronic Signature(s) ?Signed: 01/23/2022 4:11:48 PM By: Fredirick Maudlin MD FACS ?Entered By: Fredirick Maudlin on 01/23/2022 16:11:48 ?-------------------------------------------------------------------------------- ?Physician Orders Details ?Patient Name: ?Date of Service: ?John Black, John Black 01/23/2022 2:15 PM ?Medical Record Number: [id number] ?Patient Account Number: [id number] ?Date of Birth/Sex: ?Treating RN: ?1928/11/23 (88 y.o. Male) Levan Hurst ?Primary Care Provider: Jenna Luo ?Other Clinician: ?Referring Provider: ?Treating Provider/Extender: Fredirick Maudlin ?Jenna Luo ?Weeks in Treatment: 0 ?Verbal / Phone Orders: No ?Diagnosis Coding ?ICD-10 Coding ?Code Description ?V42.595 Pressure ulcer of left ankle, stage 2 ?S72.002S Fracture of unspecified part of neck of left femur, sequela ?J44.9 Chronic obstructive pulmonary disease, unspecified ?Follow-up Appointments ?ppointment in 1 week. - Dr. Celine Ahr - Room 4 - Wednesday 5/17 at 1:00 ?Return A ?Bathing/ Shower/ Hygiene ?May shower and wash wound with soap and water. ?Edema Control -  Lymphedema / SCD / Other ?Elevate legs to the level of the heart or above for 30 minutes daily and/or when sitting, a frequency of: - throughout the day ?Avoid standing for long periods of time. ?Moisturize legs daily. ?Wound Treatment ?Wound #1 - Malleolus Wound Laterality: Left, Lateral ?Cleanser: Soap and Water 1 x Per GLO/75 Days ?Discharge Instructions: May shower and wash wound with dial antibacterial soap and water prior to dressing change. ?Cleanser: Wound Cleanser 1 x Per Day/15 Days ?Discharge Instructions: Cleanse the wound with wound cleanser prior to applying a clean dressing using gauze sponges, not tissue or cotton balls. ?Prim Dressing: Santyl Ointment 1 x Per IEP/32 Days ?ary ?Discharge Instructions: Apply nickel thick amount to wound bed as instructed ?Secondary Dressing: ALLEVYN Gentle Border, 3x3 (in/in) 1 x Per Day/15 Days ?Discharge Instructions: Apply over primary dressing as directed. ?Electronic Signature(s) ?Signed: 01/23/2022 4:12:16 PM By: Fredirick Maudlin MD FACS ?Entered By: Fredirick Maudlin on 01/23/2022 16:12:16 ?-------------------------------------------------------------------------------- ?Problem List Details ?Patient Name: ?Date of Service: ?John Black, John Black 01/23/2022 2:15 PM ?Medical Record Number: [id number] ?Patient Account Number: [id number] ?Date of Birth/Sex: ?Treating RN: ?Feb 26, 1929 (88 y.o. Male) Levan Hurst ?Primary Care Provider: Jenna Luo ?Other Clinician: ?Referring Provider: ?Treating Provider/Extender: Fredirick Maudlin ?Jenna Luo ?Weeks in Treatment: 0 ?Active Problems ?ICD-10 ?Encounter ?Code Description Active Date MDM ?Diagnosis ?A63.016 Pressure ulcer of left ankle, stage 2 01/23/2022 No Yes ?S72.002S Fracture of unspecified part of neck of left femur, sequela 01/23/2022 No Yes ?J44.9 Chronic obstructive pulmonary disease, unspecified 01/23/2022 No Yes ?Inactive Problems ?Resolved Problems ?Electronic Signature(s) ?Signed: 01/23/2022 4:04:04 PM By:  Fredirick Maudlin MD FACS ?Entered By: Fredirick Maudlin on 01/23/2022 16:04:04 ?-------------------------------------------------------------------------------- ?Progress Note Details ?Patient Name: Date of Lewanda Rife

## 2022-01-26 ENCOUNTER — Other Ambulatory Visit: Payer: Self-pay | Admitting: Family Medicine

## 2022-01-29 DIAGNOSIS — J449 Chronic obstructive pulmonary disease, unspecified: Secondary | ICD-10-CM | POA: Diagnosis not present

## 2022-01-29 DIAGNOSIS — Z515 Encounter for palliative care: Secondary | ICD-10-CM | POA: Diagnosis not present

## 2022-01-29 DIAGNOSIS — J9611 Chronic respiratory failure with hypoxia: Secondary | ICD-10-CM | POA: Diagnosis not present

## 2022-01-29 NOTE — Telephone Encounter (Signed)
Requested medication (s) are due for refill today: yes ? ?Requested medication (s) are on the active medication list: yes ? ?Last refill:  01/08/22 #30 ? ?Future visit scheduled: no ? ?Notes to clinic:  med not delegated to NT to RF to refuse ? ? ?Requested Prescriptions  ?Pending Prescriptions Disp Refills  ? diphenoxylate-atropine (LOMOTIL) 2.5-0.025 MG tablet [Pharmacy Med Name: DIPHENOXYLATE-ATROP 2.5-0.025] 30 tablet 0  ?  Sig: TAKE 1 TABLET BY MOUTH 4 (FOUR) TIMES DAILY AS NEEDED FOR DIARRHEA OR LOOSE STOOLS.  ?  ? Not Delegated - Gastroenterology:  Antidiarrheals Failed - 01/26/2022 12:20 PM  ?  ?  Failed - This refill cannot be delegated  ?  ?  Passed - Valid encounter within last 12 months  ?  Recent Outpatient Visits   ? ?      ? 1 month ago Chronic diarrhea  ? [facility] [place], Cammie Mcgee, MD  ? 4 months ago Trigeminy  ? [facility] [place], Cammie Mcgee, MD  ? 4 months ago Acute cough  ? [place], Jessica A, NP  ? 5 months ago Mild vascular dementia with agitation  ? [place], Jessica A, NP  ? 5 months ago Mild vascular dementia with agitation  ? [facility] [place], Cammie Mcgee, MD  ? ?  ?  ? ? ?  ?  ?  ? ? ? ? ?

## 2022-01-31 ENCOUNTER — Encounter (HOSPITAL_BASED_OUTPATIENT_CLINIC_OR_DEPARTMENT_OTHER): Payer: PPO | Attending: General Surgery | Admitting: General Surgery

## 2022-01-31 DIAGNOSIS — J449 Chronic obstructive pulmonary disease, unspecified: Secondary | ICD-10-CM | POA: Diagnosis not present

## 2022-01-31 DIAGNOSIS — X58XXXS Exposure to other specified factors, sequela: Secondary | ICD-10-CM | POA: Diagnosis not present

## 2022-01-31 DIAGNOSIS — Z87891 Personal history of nicotine dependence: Secondary | ICD-10-CM | POA: Diagnosis not present

## 2022-01-31 DIAGNOSIS — Z9981 Dependence on supplemental oxygen: Secondary | ICD-10-CM | POA: Diagnosis not present

## 2022-01-31 DIAGNOSIS — L89523 Pressure ulcer of left ankle, stage 3: Secondary | ICD-10-CM | POA: Diagnosis not present

## 2022-01-31 DIAGNOSIS — S72002S Fracture of unspecified part of neck of left femur, sequela: Secondary | ICD-10-CM | POA: Diagnosis not present

## 2022-01-31 DIAGNOSIS — L89522 Pressure ulcer of left ankle, stage 2: Secondary | ICD-10-CM | POA: Diagnosis not present

## 2022-02-01 ENCOUNTER — Telehealth: Payer: PPO

## 2022-02-02 NOTE — Progress Notes (Signed)
DAMEIN, GAUNCE ([id number]) Visit Report for 01/31/2022 Chief Complaint Document Details Patient Name: Date of Service: John Black, John Black 01/31/2022 1:00 PM Medical Record Number: [id number] Patient Account Number: [id number] Date of Birth/Sex: Treating RN: 07/22/1929 (88 y.o. M) Primary Care Provider: Jenna Black Other Clinician: Referring Provider: Treating Provider/Extender: Mertha Finders in Treatment: 1 Information Obtained from: Patient Chief Complaint Patient is at the clinic for treatment of an open pressure ulcer of his left lateral malleolus Electronic Signature(s) Signed: 01/31/2022 1:39:30 PM By: Fredirick Maudlin MD FACS Entered By: Fredirick Maudlin on 01/31/2022 13:39:29 -------------------------------------------------------------------------------- Debridement Details Patient Name: Date of Service: John Lopes D. 01/31/2022 1:00 PM Medical Record Number: [id number] Patient Account Number: [id number] Date of Birth/Sex: Treating RN: [birth year] (88 y.o. [name] Primary Care Provider: Jenna Black Other Clinician: Referring Provider: Treating Provider/Extender: Mertha Finders in Treatment: 1 Debridement Performed for Assessment: Wound #1 Left,Lateral Malleolus Performed By: Physician Fredirick Maudlin, MD Debridement Type: Debridement Level of Consciousness (Pre-procedure): Awake and Alert Pre-procedure Verification/Time Out Yes - 13:35 Taken: Start Time: 13:35 T Area Debrided (L x W): otal 0.4 (cm) x 0.4 (cm) = 0.16 (cm) Tissue and other material debrided: Non-Viable, [place] Level: Non-Viable Tissue Debridement Description: Selective/Open Wound Instrument: Curette Bleeding: Minimum Hemostasis Achieved: Pressure End Time: 13:35 Procedural Pain: 0 Post Procedural Pain: 0 Response to Treatment: Procedure was tolerated well Level of Consciousness (Post- Awake and  Alert procedure): Post Debridement Measurements of Total Wound Length: (cm) 0.4 Stage: Category/Stage III Width: (cm) 0.4 Depth: (cm) 0.1 Volume: (cm) 0.013 Character of Wound/Ulcer Post Debridement: Requires Further Debridement Post Procedure Diagnosis Same as Pre-procedure Electronic Signature(s) Signed: 01/31/2022 2:52:08 PM By: Fredirick Maudlin MD FACS Signed: 02/01/2022 5:25:10 PM By: Levan Hurst RN, BSN Entered By: Levan Hurst on 01/31/2022 13:37:19 -------------------------------------------------------------------------------- HPI Details Patient Name: Date of Service: John Lopes D. 01/31/2022 1:00 PM Medical Record Number: [id number] Patient Account Number: [id number] Date of Birth/Sex: Treating RN: [birth year] (88 y.o. M) Primary Care Provider: Jenna Black Other Clinician: Referring Provider: Treating Provider/Extender: Mertha Finders in Treatment: 1 History of Present Illness HPI Description: ADMISSION 01/23/2022 This is a 88 year old man who suffered a hip fracture in 2022. During the time in his rehabilitation facility, he developed a small pressure sore on his left lateral ankle. He has been treated at an outside podiatrist for over a year without any significant healing. He is not diabetic. He is oxygen dependent secondary to COPD and also takes prednisone for his lung disease. He quit smoking quite some time ago. ABI performed at the [facility] vascular lab demonstrated a right ABI of 0.85 and a left ABI of 0.99. There is a small ulcer on his left lateral malleolus. It is circular and has some slough at the base. Apparently just last week, the podiatrist they were seeing changed him from Aquacel to [facility]. He has specialty stockings that have padding over the lateral and medial malleoli and he is wearing these at night. 01/31/2022: The wound on his left lateral malleolus is a little bit smaller today. It still has accumulated  slough at the base. No concern for infection. Electronic Signature(s) Signed: 01/31/2022 1:39:56 PM By: Fredirick Maudlin MD FACS Entered By: Fredirick Maudlin on 01/31/2022 13:39:56 -------------------------------------------------------------------------------- Physical Exam Details Patient Name: Date of Service: John Lopes D. 01/31/2022 1:00 PM Medical Record Number: [id number] Patient Account Number: [id number] Date of Birth/Sex: Treating RN: 06-26-1929 (88 y.o. M) Primary Care Provider:  Jenna Black Other Clinician: Referring Provider: Treating Provider/Extender: Mertha Finders in Treatment: 1 Constitutional . . . . No acute distress. Respiratory Normal work of breathing on supplemental oxygen. Notes 01/31/2022: The small ulcer on his left lateral malleolus is a bit smaller this week. There is loose yellow slough in the base. No concern for infection. Electronic Signature(s) Signed: 01/31/2022 1:40:53 PM By: Fredirick Maudlin MD FACS Entered By: Fredirick Maudlin on 01/31/2022 13:40:53 -------------------------------------------------------------------------------- Physician Orders Details Patient Name: Date of Service: John Lopes D. 01/31/2022 1:00 PM Medical Record Number: [id number] Patient Account Number: [id number] Date of Birth/Sex: Treating RN: [birth year] (88 y.o. [name] Primary Care Provider: Jenna Black Other Clinician: Referring Provider: Treating Provider/Extender: Mertha Finders in Treatment: 1 Verbal / Phone Orders: No Diagnosis Coding ICD-10 Coding Code Description [phone] Pressure ulcer of left ankle, stage 2 S72.002S Fracture of unspecified part of neck of left femur, sequela J44.9 Chronic obstructive pulmonary disease, unspecified Follow-up Appointments ppointment in 1 week. - Dr. Celine Ahr - Room 4 - Wednesday 5/24 at 3:15 Return A Bathing/ Shower/ Hygiene May shower and wash  wound with soap and water. Edema Control - Lymphedema / SCD / Other Elevate legs to the level of the heart or above for 30 minutes daily and/or when sitting, a frequency of: - throughout the day Avoid standing for long periods of time. Moisturize legs daily. Wound Treatment Wound #1 - Malleolus Wound Laterality: Left, Lateral Cleanser: Soap and Water 1 x Per EVO/35 Days Discharge Instructions: May shower and wash wound with dial antibacterial soap and water prior to dressing change. Cleanser: Wound Cleanser 1 x Per Day/15 Days Discharge Instructions: Cleanse the wound with wound cleanser prior to applying a clean dressing using gauze sponges, not tissue or cotton balls. Prim Dressing: Santyl Ointment 1 x Per Day/15 Days ary Discharge Instructions: Apply nickel thick amount to wound bed as instructed Secondary Dressing: ALLEVYN Gentle Border, 3x3 (in/in) 1 x Per Day/15 Days Discharge Instructions: Apply over primary dressing as directed. Electronic Signature(s) Signed: 01/31/2022 2:52:08 PM By: Fredirick Maudlin MD FACS Signed: 02/01/2022 5:25:10 PM By: Levan Hurst RN, BSN Previous Signature: 01/31/2022 1:41:00 PM Version By: Fredirick Maudlin MD FACS Entered By: Levan Hurst on 01/31/2022 13:41:14 -------------------------------------------------------------------------------- Problem List Details Patient Name: Date of Service: John Lopes D. 01/31/2022 1:00 PM Medical Record Number: [id number] Patient Account Number: [id number] Date of Birth/Sex: Treating RN: 07-26-1929 (88 y.o. [name] Primary Care Provider: Jenna Black Other Clinician: Referring Provider: Treating Provider/Extender: Mertha Finders in Treatment: 1 Active Problems ICD-10 Encounter Code Description Active Date MDM Diagnosis [phone] Pressure ulcer of left ankle, stage 2 01/23/2022 No Yes S72.002S Fracture of unspecified part of neck of left femur, sequela 01/23/2022 No  Yes J44.9 Chronic obstructive pulmonary disease, unspecified 01/23/2022 No Yes Inactive Problems Resolved Problems Electronic Signature(s) Signed: 01/31/2022 1:39:16 PM By: Fredirick Maudlin MD FACS Entered By: Fredirick Maudlin on 01/31/2022 13:39:16 -------------------------------------------------------------------------------- Progress Note Details Patient Name: Date of Service: John Lopes D. 01/31/2022 1:00 PM Medical Record Number: [id number] Patient Account Number: [id number] Date of Birth/Sex: Treating RN: 01/10/1929 (88 y.o. M) Primary Care Provider: Jenna Black Other Clinician: Referring Provider: Treating Provider/Extender: Mertha Finders in Treatment: 1 Subjective Chief Complaint Information obtained from Patient Patient is at the clinic for treatment of an open pressure ulcer of his left lateral malleolus History of Present Illness (HPI) ADMISSION 01/23/2022 This is a 88 year old man who suffered a hip  fracture in 2022. During the time in his rehabilitation facility, he developed a small pressure sore on his left lateral ankle. He has been treated at an outside podiatrist for over a year without any significant healing. He is not diabetic. He is oxygen dependent secondary to COPD and also takes prednisone for his lung disease. He quit smoking quite some time ago. ABI performed at the [facility] vascular lab demonstrated a right ABI of 0.85 and a left ABI of 0.99. There is a small ulcer on his left lateral malleolus. It is circular and has some slough at the base. Apparently just last week, the podiatrist they were seeing changed him from Aquacel to [facility]. He has specialty stockings that have padding over the lateral and medial malleoli and he is wearing these at night. 01/31/2022: The wound on his left lateral malleolus is a little bit smaller today. It still has accumulated slough at the base. No concern for infection. Patient  History Information obtained from Patient, Caregiver. Family History Unknown History. Social History Former smoker - quit 12 years ago, Marital Status - Married, Alcohol Use - Never, Drug Use - No History, Caffeine Use - Never. Medical History Respiratory Patient has history of Chronic Obstructive Pulmonary Disease (COPD) Endocrine Denies history of Type I Diabetes, Type II Diabetes Musculoskeletal Patient has history of Osteoarthritis Medical A Surgical History Notes nd Cardiovascular TIA Gastrointestinal inguinal hernia Endocrine Hypothyroidism Oncologic Prostate cancer 20 years ago Objective Constitutional No acute distress. Vitals Time Taken: 1:14 PM, Height: 64 in, Weight: 118 lbs, BMI: 20.3, Temperature: 97.7 F, Pulse: 89 bpm, Respiratory Rate: 16 breaths/min, Blood Pressure: 119/81 mmHg. Respiratory Normal work of breathing on supplemental oxygen. General Notes: 01/31/2022: The small ulcer on his left lateral malleolus is a bit smaller this week. There is loose yellow slough in the base. No concern for infection. Integumentary (Hair, Skin) Wound #1 status is Open. Original cause of wound was Gradually Appeared. The date acquired was: 01/16/2020. The wound has been in treatment 1 weeks. The wound is located on the Left,Lateral Malleolus. The wound measures 0.4cm length x 0.4cm width x 0.1cm depth; 0.126cm^2 area and 0.013cm^3 volume. There is Fat Layer (Subcutaneous Tissue) exposed. There is no tunneling or undermining noted. There is a medium amount of serous drainage noted. The wound margin is flat and intact. There is large (67-100%) pink, pale granulation within the wound bed. There is a small (1-33%) amount of necrotic tissue within the wound bed including Adherent Slough. Assessment Active Problems ICD-10 Pressure ulcer of left ankle, stage 2 Fracture of unspecified part of neck of left femur, sequela Chronic obstructive pulmonary disease,  unspecified Procedures Wound #1 Pre-procedure diagnosis of Wound #1 is a Pressure Ulcer located on the Left,Lateral Malleolus . There was a Selective/Open Wound Non-Viable Tissue Debridement with a total area of 0.16 sq cm performed by Fredirick Maudlin, MD. With the following instrument(s): Curette to remove Non-Viable tissue/material. Material removed includes [facility]. No specimens were taken. A time out was conducted at 13:35, prior to the start of the procedure. A Minimum amount of bleeding was controlled with Pressure. The procedure was tolerated well with a pain level of 0 throughout and a pain level of 0 following the procedure. Post Debridement Measurements: 0.4cm length x 0.4cm width x 0.1cm depth; 0.013cm^3 volume. Post debridement Stage noted as Category/Stage III. Character of Wound/Ulcer Post Debridement requires further debridement. Post procedure Diagnosis Wound #1: Same as Pre-Procedure Plan 01/31/2022: The small ulcer on his left lateral malleolus  is a bit smaller this week. There is loose yellow slough in the base. No concern for infection. I used a curette to debride the slough from the ulcer. The base is a little bit pale, but does look like it is beginning to granulate. We will continue using the Santyl for his dressing changes this week. Follow-up in 1 week's time. Electronic Signature(s) Signed: 01/31/2022 1:41:31 PM By: Fredirick Maudlin MD FACS Entered By: Fredirick Maudlin on 01/31/2022 13:41:30 -------------------------------------------------------------------------------- HxROS Details Patient Name: Date of Service: John Lopes D. 01/31/2022 1:00 PM Medical Record Number: [id number] Patient Account Number: [id number] Date of Birth/Sex: Treating RN: 1929/05/23 (88 y.o. M) Primary Care Provider: Jenna Black Other Clinician: Referring Provider: Treating Provider/Extender: Mertha Finders in Treatment: 1 Information Obtained  From Patient Caregiver Respiratory Medical History: Positive for: Chronic Obstructive Pulmonary Disease (COPD) Cardiovascular Medical History: Past Medical History Notes: TIA Gastrointestinal Medical History: Past Medical History Notes: inguinal hernia Endocrine Medical History: Negative for: Type I Diabetes; Type II Diabetes Past Medical History Notes: Hypothyroidism Musculoskeletal Medical History: Positive for: Osteoarthritis Oncologic Medical History: Past Medical History Notes: Prostate cancer 20 years ago Immunizations Pneumococcal Vaccine: Received Pneumococcal Vaccination: Yes Received Pneumococcal Vaccination On or After 60th Birthday: No Implantable Devices No devices added Family and Social History Unknown History: Yes; Former smoker - quit 12 years ago; Marital Status - Married; Alcohol Use: Never; Drug Use: No History; Caffeine Use: Never; Financial Concerns: No; Food, Clothing or Shelter Needs: No; Support System Lacking: No; Transportation Concerns: No Electronic Signature(s) Signed: 01/31/2022 2:52:08 PM By: Fredirick Maudlin MD FACS Signed: 01/31/2022 2:52:08 PM By: Fredirick Maudlin MD FACS Entered By: Fredirick Maudlin on 01/31/2022 13:40:00 -------------------------------------------------------------------------------- SuperBill Details Patient Name: Date of Service: John Lopes D. 01/31/2022 Medical Record Number: [id number] Patient Account Number: [id number] Date of Birth/Sex: Treating RN: [birth year] (88 y.o. M) Primary Care Provider: Jenna Black Other Clinician: Referring Provider: Treating Provider/Extender: Mertha Finders in Treatment: 1 Diagnosis Coding ICD-10 Codes Code Description [phone] Pressure ulcer of left ankle, stage 2 S72.002S Fracture of unspecified part of neck of left femur, sequela J44.9 Chronic obstructive pulmonary disease, unspecified Facility Procedures CPT4 Code: [id number]  9 Description: 7597 - DEBRIDE WOUND 1ST 20 SQ CM OR < ICD-10 Diagnosis Description L89.522 Pressure ulcer of left ankle, stage 2 Modifier: Quantity: 1 Physician Procedures : CPT4 Code Description Modifier [id number] - WC PHYS LEVEL 3 - EST PT 25 ICD-10 Diagnosis Description L89.522 Pressure ulcer of left ankle, stage 2 J44.9 Chronic obstructive pulmonary disease, unspecified S72.002S Fracture of unspecified part of neck  of left femur, sequela Quantity: 1 : [id number] - WC PHYS DEBR WO ANESTH 20 SQ CM ICD-10 Diagnosis Description L89.522 Pressure ulcer of left ankle, stage 2 Quantity: 1 Electronic Signature(s) Signed: 01/31/2022 1:41:45 PM By: Fredirick Maudlin MD FACS Entered By: Fredirick Maudlin on 01/31/2022 13:41:45

## 2022-02-02 NOTE — Progress Notes (Signed)
John, Black ([id number]) Visit Report for 01/31/2022 Arrival Information Details Patient Name: Date of Service: John Black, John Black 01/31/2022 1:00 PM Medical Record Number: [id number] Patient Account Number: [id number] Date of Birth/Sex: Treating RN: May 10, 1929 (88 y.o. [name] Primary Care Adnan Vanvoorhis: Jenna Luo Other Clinician: Referring Myleigh Amara: Treating Lindia Garms/Extender: Mertha Finders in Treatment: 1 Visit Information History Since Last Visit Added or deleted any medications: No Patient Arrived: Wheel Chair Any new allergies or adverse reactions: No Arrival Time: 13:14 Had a fall or experienced change in No Accompanied By: daughter and wife activities of daily living that may affect Transfer Assistance: None risk of falls: Patient Identification Verified: Yes Signs or symptoms of abuse/neglect since last visito No Secondary Verification Process Completed: Yes Hospitalized since last visit: No Patient Requires Transmission-Based Precautions: No Implantable device outside of the clinic excluding No Patient Has Alerts: Yes cellular tissue based products placed in the center Patient Alerts: R ABI: 0.85 since last visit: L ABI: 0.99 Has Dressing in Place as Prescribed: Yes Pain Present Now: No Electronic Signature(s) Signed: 02/01/2022 5:25:10 PM By: Levan Hurst RN, BSN Entered By: Levan Hurst on 01/31/2022 13:15:10 -------------------------------------------------------------------------------- Encounter Discharge Information Details Patient Name: Date of Service: John Lopes D. 01/31/2022 1:00 PM Medical Record Number: [id number] Patient Account Number: [id number] Date of Birth/Sex: Treating RN: 12/20/1928 (88 y.o. [name] Primary Care Caylie Sandquist: Jenna Luo Other Clinician: Referring Jaydeen Darley: Treating Malayah Demuro/Extender: Mertha Finders in Treatment: 1 Encounter  Discharge Information Items Post Procedure Vitals Discharge Condition: Stable Temperature (F): 97.7 Ambulatory Status: Ambulatory Pulse (bpm): 89 Discharge Destination: Home Respiratory Rate (breaths/min): 16 Transportation: Private Auto Blood Pressure (mmHg): 119/81 Accompanied By: daughter and wife Schedule Follow-up Appointment: Yes Clinical Summary of Care: Patient Declined Electronic Signature(s) Signed: 02/01/2022 5:25:10 PM By: Levan Hurst RN, BSN Entered By: Levan Hurst on 01/31/2022 14:28:14 -------------------------------------------------------------------------------- Lower Extremity Assessment Details Patient Name: Date of Service: John Lopes D. 01/31/2022 1:00 PM Medical Record Number: [id number] Patient Account Number: [id number] Date of Birth/Sex: Treating RN: 23-Feb-1929 (88 y.o. [name] Primary Care John Black: Jenna Luo Other Clinician: Referring Neri Samek: Treating Kayzlee Wirtanen/Extender: Mertha Finders in Treatment: 1 Edema Assessment Assessed: [Left: No] [Right: No] Edema: [Left: Ye] [Right: s] Calf Left: Right: Point of Measurement: From Medial Instep 28 cm Ankle Left: Right: Point of Measurement: From Medial Instep 22 cm Vascular Assessment Pulses: Dorsalis Pedis Palpable: [Left:Yes] Electronic Signature(s) Signed: 02/01/2022 5:25:10 PM By: Levan Hurst RN, BSN Entered By: Levan Hurst on 01/31/2022 13:18:17 -------------------------------------------------------------------------------- Multi Wound Chart Details Patient Name: Date of Service: John Lopes D. 01/31/2022 1:00 PM Medical Record Number: [id number] Patient Account Number: [id number] Date of Birth/Sex: Treating RN: 11-Mar-1929 (88 y.o. M) Primary Care Shameeka Silliman: Jenna Luo Other Clinician: Referring John Black: Treating John Black/Extender: Mertha Finders in Treatment: 1 Vital Signs Height(in):  73 Pulse(bpm): 71 Weight(lbs): 118 Blood Pressure(mmHg): 119/81 Body Mass Index(BMI): 20.3 Temperature(F): 97.7 Respiratory Rate(breaths/min): 16 Photos: [1:Left, Lateral Malleolus] [N/A:N/A N/A] Wound Location: [1:Gradually Appeared] [N/A:N/A] Wounding Event: [1:Pressure Ulcer] [N/A:N/A] Primary Etiology: [1:Chronic Obstructive Pulmonary] [N/A:N/A] Comorbid History: [1:Disease (COPD), Osteoarthritis 01/16/2020] [N/A:N/A] Date Acquired: [1:1] [N/A:N/A] Weeks of Treatment: [1:Open] [N/A:N/A] Wound Status: [1:No] [N/A:N/A] Wound Recurrence: [1:0.4x0.4x0.1] [N/A:N/A] Measurements L x W x D (cm) [1:0.126] [N/A:N/A] A (cm) : rea [1:0.013] [N/A:N/A] Volume (cm) : [1:-6.80%] [N/A:N/A] % Reduction in A [1:rea: -8.30%] [N/A:N/A] % Reduction in Volume: [1:Category/Stage III] [N/A:N/A] Classification: [1:Medium] [N/A:N/A] Exudate A mount: [1:Serous] [N/A:N/A] Exudate  Type: [1:amber] [N/A:N/A] Exudate Color: [1:Flat and Intact] [N/A:N/A] Wound Margin: [1:Large (67-100%)] [N/A:N/A] Granulation A mount: [1:Pink, Pale] [N/A:N/A] Granulation Quality: [1:Small (1-33%)] [N/A:N/A] Necrotic A mount: [1:Fat Layer (Subcutaneous Tissue): Yes N/A] Exposed Structures: [1:Fascia: No Tendon: No Muscle: No Joint: No Bone: No Small (1-33%)] [N/A:N/A] Epithelialization: [1:Debridement - Selective/Open Wound N/A] Debridement: Pre-procedure Verification/Time Out 13:35 [N/A:N/A] Taken: [1:Slough] [N/A:N/A] Tissue Debrided: [1:Non-Viable Tissue] [N/A:N/A] Level: [1:0.16] [N/A:N/A] Debridement A (sq cm): [1:rea Curette] [N/A:N/A] Instrument: [1:Minimum] [N/A:N/A] Bleeding: [1:Pressure] [N/A:N/A] Hemostasis A chieved: [1:0] [N/A:N/A] Procedural Pain: [1:0] [N/A:N/A] Post Procedural Pain: [1:Procedure was tolerated well] [N/A:N/A] Debridement Treatment Response: [1:0.4x0.4x0.1] [N/A:N/A] Post Debridement Measurements L x W x D (cm) [1:0.013] [N/A:N/A] Post Debridement Volume: (cm) [1:Category/Stage  III] [N/A:N/A] Post Debridement Stage: [1:Debridement] [N/A:N/A] Treatment Notes Electronic Signature(s) Signed: 01/31/2022 1:39:24 PM By: Fredirick Maudlin MD FACS Entered By: Fredirick Maudlin on 01/31/2022 13:39:24 -------------------------------------------------------------------------------- Multi-Disciplinary Care Plan Details Patient Name: Date of Service: John Lopes D. 01/31/2022 1:00 PM Medical Record Number: [id number] Patient Account Number: [id number] Date of Birth/Sex: Treating RN: 1928-12-14 (88 y.o. [name] Primary Care Matina Rodier: Jenna Luo Other Clinician: Referring Eliberto Sole: Treating Nghia Mcentee/Extender: Mertha Finders in Treatment: 1 [place] reviewed with physician Active Inactive Abuse / Safety / Falls / Self Care Management Nursing Diagnoses: History of Falls Potential for falls Goals: Patient will not experience any injury related to falls Date Initiated: 01/23/2022 Target Resolution Date: 02/23/2022 Goal Status: Active Patient/caregiver will verbalize/demonstrate measures taken to prevent injury and/or falls Date Initiated: 01/23/2022 Target Resolution Date: 02/23/2022 Goal Status: Active Interventions: Assess Activities of Daily Living upon admission and as needed Assess fall risk on admission and as needed Assess: immobility, friction, shearing, incontinence upon admission and as needed Assess impairment of mobility on admission and as needed per policy Assess personal safety and home safety (as indicated) on admission and as needed Assess self care needs on admission and as needed Provide education on fall prevention Provide education on personal and home safety Notes: Pressure Nursing Diagnoses: Knowledge deficit related to causes and risk factors for pressure ulcer development Knowledge deficit related to management of pressures ulcers Potential for impaired tissue integrity related to  pressure, friction, moisture, and shear Goals: Patient/caregiver will verbalize risk factors for pressure ulcer development Date Initiated: 01/23/2022 Target Resolution Date: 02/23/2022 Goal Status: Active Patient/caregiver will verbalize understanding of pressure ulcer management Date Initiated: 01/23/2022 Target Resolution Date: 02/23/2022 Goal Status: Active Interventions: Assess: immobility, friction, shearing, incontinence upon admission and as needed Assess offloading mechanisms upon admission and as needed Assess potential for pressure ulcer upon admission and as needed Provide education on pressure ulcers Notes: Wound/Skin Impairment Nursing Diagnoses: Impaired tissue integrity Knowledge deficit related to ulceration/compromised skin integrity Goals: Patient/caregiver will verbalize understanding of skin care regimen Date Initiated: 01/23/2022 Target Resolution Date: 02/23/2022 Goal Status: Active Ulcer/skin breakdown will have a volume reduction of 30% by week 4 Date Initiated: 01/23/2022 Target Resolution Date: 02/23/2022 Goal Status: Active Interventions: Assess patient/caregiver ability to obtain necessary supplies Assess patient/caregiver ability to perform ulcer/skin care regimen upon admission and as needed Assess ulceration(s) every visit Provide education on ulcer and skin care Notes: Electronic Signature(s) Signed: 02/01/2022 5:25:10 PM By: Levan Hurst RN, BSN Entered By: Levan Hurst on 01/31/2022 13:31:58 -------------------------------------------------------------------------------- Pain Assessment Details Patient Name: Date of Service: John Lopes D. 01/31/2022 1:00 PM Medical Record Number: [id number] Patient Account Number: [id number] Date of Birth/Sex: Treating RN: 1929/08/21 (88 y.o. [name] Primary Care Arlind Klingerman: Dennard Schaumann,  Cletus Gash Other Clinician: Referring Jillann Charette: Treating Percy Winterrowd/Extender: Mertha Finders in  Treatment: 1 Active Problems Location of Pain Severity and Description of Pain Patient Has Paino No Site Locations Pain Management and Medication Current Pain Management: Electronic Signature(s) Signed: 02/01/2022 5:25:10 PM By: Levan Hurst RN, BSN Entered By: Levan Hurst on 01/31/2022 13:16:59 -------------------------------------------------------------------------------- Patient/Caregiver Education Details Patient Name: Date of Service: Margurite Auerbach 5/17/2023andnbsp1:00 PM Medical Record Number: [id number] Patient Account Number: [id number] Date of Birth/Gender: Treating RN: 10-Oct-1928 (88 y.o. [name] Primary Care Physician: Jenna Luo Other Clinician: Referring Physician: Treating Physician/Extender: Mertha Finders in Treatment: 1 Education Assessment Education Provided To: Patient Education Topics Provided Wound/Skin Impairment: Methods: Explain/Verbal Responses: State content correctly Electronic Signature(s) Signed: 02/01/2022 5:25:10 PM By: Levan Hurst RN, BSN Entered By: Levan Hurst on 01/31/2022 13:32:11 -------------------------------------------------------------------------------- Wound Assessment Details Patient Name: Date of Service: John Lopes D. 01/31/2022 1:00 PM Medical Record Number: [id number] Patient Account Number: [id number] Date of Birth/Sex: Treating RN: 10/01/1928 (88 y.o. [name] Primary Care Melik Blancett: Jenna Luo Other Clinician: Referring Aryam Zhan: Treating Keshia Weare/Extender: Mertha Finders in Treatment: 1 Wound Status Wound Number: 1 Primary Etiology: Pressure Ulcer Wound Location: Left, Lateral Malleolus Wound Status: Open Wounding Event: Gradually Appeared Comorbid Chronic Obstructive Pulmonary Disease (COPD), History: Osteoarthritis Date Acquired: 01/16/2020 Weeks Of Treatment: 1 Clustered Wound: No Photos Wound  Measurements Length: (cm) 0.4 Width: (cm) 0.4 Depth: (cm) 0.1 Area: (cm) 0.126 Volume: (cm) 0.013 % Reduction in Area: -6.8% % Reduction in Volume: -8.3% Epithelialization: Small (1-33%) Tunneling: No Undermining: No Wound Description Classification: Category/Stage III Wound Margin: Flat and Intact Exudate Amount: Medium Exudate Type: Serous Exudate Color: amber Foul Odor After Cleansing: No Slough/Fibrino Yes Wound Bed Granulation Amount: Large (67-100%) Exposed Structure Granulation Quality: Pink, Pale Fascia Exposed: No Necrotic Amount: Small (1-33%) Fat Layer (Subcutaneous Tissue) Exposed: Yes Necrotic Quality: Adherent Slough Tendon Exposed: No Muscle Exposed: No Joint Exposed: No Bone Exposed: No Treatment Notes Wound #1 (Malleolus) Wound Laterality: Left, Lateral Cleanser Soap and Water Discharge Instruction: May shower and wash wound with dial antibacterial soap and water prior to dressing change. Wound Cleanser Discharge Instruction: Cleanse the wound with wound cleanser prior to applying a clean dressing using gauze sponges, not tissue or cotton balls. Peri-Wound Care Topical Primary Dressing Santyl Ointment Discharge Instruction: Apply nickel thick amount to wound bed as instructed Secondary Dressing ALLEVYN Gentle Border, 3x3 (in/in) Discharge Instruction: Apply over primary dressing as directed. Secured With Compression Wrap Compression Stockings Environmental education officer) Signed: 02/01/2022 5:25:10 PM By: Levan Hurst RN, BSN Entered By: Levan Hurst on 01/31/2022 13:23:10 -------------------------------------------------------------------------------- Vitals Details Patient Name: Date of Service: John Lopes D. 01/31/2022 1:00 PM Medical Record Number: [id number] Patient Account Number: [id number] Date of Birth/Sex: Treating RN: Aug 16, 1929 (88 y.o. [name] Primary Care Markel Mergenthaler: Jenna Luo Other  Clinician: Referring Makinsley Schiavi: Treating Kandyce Dieguez/Extender: Mertha Finders in Treatment: 1 Vital Signs Time Taken: 13:14 Temperature (F): 97.7 Height (in): 64 Pulse (bpm): 89 Weight (lbs): 118 Respiratory Rate (breaths/min): 16 Body Mass Index (BMI): 20.3 Blood Pressure (mmHg): 119/81 Reference Range: 80 - 120 mg / dl Electronic Signature(s) Signed: 02/01/2022 5:25:10 PM By: Levan Hurst RN, BSN Entered By: Levan Hurst on 01/31/2022 13:16:55

## 2022-02-07 ENCOUNTER — Encounter (HOSPITAL_BASED_OUTPATIENT_CLINIC_OR_DEPARTMENT_OTHER): Payer: PPO | Attending: General Surgery | Admitting: General Surgery

## 2022-02-07 DIAGNOSIS — J449 Chronic obstructive pulmonary disease, unspecified: Secondary | ICD-10-CM | POA: Diagnosis not present

## 2022-02-07 DIAGNOSIS — L89522 Pressure ulcer of left ankle, stage 2: Secondary | ICD-10-CM | POA: Diagnosis not present

## 2022-02-07 DIAGNOSIS — L97322 Non-pressure chronic ulcer of left ankle with fat layer exposed: Secondary | ICD-10-CM | POA: Diagnosis not present

## 2022-02-07 NOTE — Progress Notes (Signed)
John Black, John Black ([id number]) Visit Report for 02/07/2022 Chief Complaint Document Details Patient Name: Date of Service: John Black, John Black 02/07/2022 3:15 PM Medical Record Number: [id number] Patient Account Number: [id number] Date of Birth/Sex: Treating RN: 1929-03-17 (88 y.o. John Black Primary Care Provider: Jenna Luo Other Clinician: Referring Provider: Treating Provider/Extender: Mertha Finders in Treatment: 2 Information Obtained from: Patient Chief Complaint Patient is at the clinic for treatment of an open pressure ulcer of his left lateral malleolus Electronic Signature(s) Signed: 02/07/2022 5:27:16 PM By: Fredirick Maudlin MD FACS Entered By: Fredirick Maudlin on 02/07/2022 17:27:16 -------------------------------------------------------------------------------- HPI Details Patient Name: Date of Service: John Lopes D. 02/07/2022 3:15 PM Medical Record Number: [id number] Patient Account Number: [id number] Date of Birth/Sex: Treating RN: March 23, 1929 (88 y.o. John Black Primary Care Provider: Jenna Luo Other Clinician: Referring Provider: Treating Provider/Extender: Mertha Finders in Treatment: 2 History of Present Illness HPI Description: ADMISSION 01/23/2022 This is a 88 year old man who suffered a hip fracture in 2022. During the time in his rehabilitation facility, he developed a small pressure sore on his left lateral ankle. He has been treated at an outside podiatrist for over a year without any significant healing. He is not diabetic. He is oxygen dependent secondary to COPD and also takes prednisone for his lung disease. He quit smoking quite some time ago. ABI performed at the [facility] vascular lab demonstrated a right ABI of 0.85 and a left ABI of 0.99. There is a small ulcer on his left lateral malleolus. It is circular and has some slough at the base. Apparently just last week,  the podiatrist they were seeing changed him from Aquacel to [facility]. He has specialty stockings that have padding over the lateral and medial malleoli and he is wearing these at night. 01/31/2022: The wound on his left lateral malleolus is a little bit smaller today. It still has accumulated slough at the base. No concern for infection. 02/07/2022: The wound is smaller and cleaner with no real accumulation of slough. We have been using Santyl. Electronic Signature(s) Signed: 02/07/2022 5:27:48 PM By: Fredirick Maudlin MD FACS Entered By: Fredirick Maudlin on 02/07/2022 17:27:47 -------------------------------------------------------------------------------- Physical Exam Details Patient Name: Date of Service: John Black 02/07/2022 3:15 PM Medical Record Number: [id number] Patient Account Number: [id number] Date of Birth/Sex: Treating RN: 1929/03/07 (88 y.o. John Black Primary Care Provider: Jenna Luo Other Clinician: Referring Provider: Treating Provider/Extender: Mertha Finders in Treatment: 2 Constitutional He is hypertensive, but asymptomatic.. . . . No acute distress. Respiratory Normal work of breathing on supplemental oxygen. Notes 02/07/2022: The ulcer is a bit smaller today with essentially no accumulation of slough in the base. Electronic Signature(s) Signed: 02/07/2022 5:29:09 PM By: Fredirick Maudlin MD FACS Entered By: Fredirick Maudlin on 02/07/2022 17:29:09 -------------------------------------------------------------------------------- Physician Orders Details Patient Name: Date of Service: John Lopes D. 02/07/2022 3:15 PM Medical Record Number: [id number] Patient Account Number: [id number] Date of Birth/Sex: Treating RN: 07/18/1929 (88 y.o. John Black Primary Care Provider: Jenna Luo Other Clinician: Referring Provider: Treating Provider/Extender: Mertha Finders in Treatment:  2 Verbal / Phone Orders: No Diagnosis Coding ICD-10 Coding Code Description [phone] Pressure ulcer of left ankle, stage 2 S72.002S Fracture of unspecified part of neck of left femur, sequela J44.9 Chronic obstructive pulmonary disease, unspecified Follow-up Appointments ppointment in 1 week. - Dr. Celine Ahr - Room 4 - Thursday 6/1 at 12:45 Return A Bathing/ Shower/ Hygiene May shower and  wash wound with soap and water. Edema Control - Lymphedema / SCD / Other Elevate legs to the level of the heart or above for 30 minutes daily and/or when sitting, a frequency of: - throughout the day Avoid standing for long periods of time. Moisturize legs daily. Wound Treatment Wound #1 - Malleolus Wound Laterality: Left, Lateral Cleanser: Soap and Water 1 x Per DGU/44 Days Discharge Instructions: May shower and wash wound with dial antibacterial soap and water prior to dressing change. Cleanser: Wound Cleanser 1 x Per Day/15 Days Discharge Instructions: Cleanse the wound with wound cleanser prior to applying a clean dressing using gauze sponges, not tissue or cotton balls. Prim Dressing: Promogran Prisma Matrix, 4.34 (sq in) (silver collagen) 1 x Per Day/15 Days ary Discharge Instructions: Moisten collagen with saline or hydrogel Secondary Dressing: ALLEVYN Gentle Border, 3x3 (in/in) 1 x Per Day/15 Days Discharge Instructions: Apply over primary dressing as directed. Electronic Signature(s) Signed: 02/07/2022 6:00:25 PM By: Fredirick Maudlin MD FACS Entered By: Fredirick Maudlin on 02/07/2022 17:29:19 -------------------------------------------------------------------------------- Problem List Details Patient Name: Date of Service: John Lopes D. 02/07/2022 3:15 PM Medical Record Number: [id number] Patient Account Number: [id number] Date of Birth/Sex: Treating RN: Oct 13, 1928 (88 y.o. John Black Primary Care Provider: Jenna Luo Other Clinician: Referring Provider: Treating  Provider/Extender: Mertha Finders in Treatment: 2 Active Problems ICD-10 Encounter Code Description Active Date MDM Diagnosis [phone] Pressure ulcer of left ankle, stage 2 01/23/2022 No Yes S72.002S Fracture of unspecified part of neck of left femur, sequela 01/23/2022 No Yes J44.9 Chronic obstructive pulmonary disease, unspecified 01/23/2022 No Yes Inactive Problems Resolved Problems Electronic Signature(s) Signed: 02/07/2022 5:27:02 PM By: Fredirick Maudlin MD FACS Entered By: Fredirick Maudlin on 02/07/2022 17:27:01 -------------------------------------------------------------------------------- Progress Note Details Patient Name: Date of Service: John Lopes D. 02/07/2022 3:15 PM Medical Record Number: [id number] Patient Account Number: [id number] Date of Birth/Sex: Treating RN: 12-Dec-1928 (88 y.o. John Black Primary Care Provider: Jenna Luo Other Clinician: Referring Provider: Treating Provider/Extender: Mertha Finders in Treatment: 2 Subjective Chief Complaint Information obtained from Patient Patient is at the clinic for treatment of an open pressure ulcer of his left lateral malleolus History of Present Illness (HPI) ADMISSION 01/23/2022 This is a 88 year old man who suffered a hip fracture in 2022. During the time in his rehabilitation facility, he developed a small pressure sore on his left lateral ankle. He has been treated at an outside podiatrist for over a year without any significant healing. He is not diabetic. He is oxygen dependent secondary to COPD and also takes prednisone for his lung disease. He quit smoking quite some time ago. ABI performed at the [facility] vascular lab demonstrated a right ABI of 0.85 and a left ABI of 0.99. There is a small ulcer on his left lateral malleolus. It is circular and has some slough at the base. Apparently just last week, the podiatrist they were seeing changed him  from Aquacel to [facility]. He has specialty stockings that have padding over the lateral and medial malleoli and he is wearing these at night. 01/31/2022: The wound on his left lateral malleolus is a little bit smaller today. It still has accumulated slough at the base. No concern for infection. 02/07/2022: The wound is smaller and cleaner with no real accumulation of slough. We have been using Santyl. Patient History Information obtained from Patient, Caregiver. Family History Unknown History. Social History Former smoker - quit 12 years ago, Marital Status - Married, Alcohol Use -  Never, Drug Use - No History, Caffeine Use - Never. Medical History Respiratory Patient has history of Chronic Obstructive Pulmonary Disease (COPD) Endocrine Denies history of Type I Diabetes, Type II Diabetes Musculoskeletal Patient has history of Osteoarthritis Medical A Surgical History Notes nd Cardiovascular TIA Gastrointestinal inguinal hernia Endocrine Hypothyroidism Oncologic Prostate cancer 20 years ago Objective Constitutional He is hypertensive, but asymptomatic.Marland Kitchen No acute distress. Vitals Time Taken: 4:09 PM, Height: 64 in, Weight: 118 lbs, BMI: 20.3, Temperature: 98.1 F, Pulse: 90 bpm, Respiratory Rate: 16 breaths/min, Blood Pressure: 164/76 mmHg. Respiratory Normal work of breathing on supplemental oxygen. General Notes: 02/07/2022: The ulcer is a bit smaller today with essentially no accumulation of slough in the base. Integumentary (Hair, Skin) Wound #1 status is Open. Original cause of wound was Gradually Appeared. The date acquired was: 01/16/2020. The wound has been in treatment 2 weeks. The wound is located on the Left,Lateral Malleolus. The wound measures 0.3cm length x 0.3cm width x 0.1cm depth; 0.071cm^2 area and 0.007cm^3 volume. There is Fat Layer (Subcutaneous Tissue) exposed. There is a medium amount of serous drainage noted. The wound margin is flat and intact. There is large  (67- 100%) pink, pale granulation within the wound bed. There is a small (1-33%) amount of necrotic tissue within the wound bed including Adherent Slough. Assessment Active Problems ICD-10 Pressure ulcer of left ankle, stage 2 Fracture of unspecified part of neck of left femur, sequela Chronic obstructive pulmonary disease, unspecified Plan Follow-up Appointments: Return Appointment in 1 week. - Dr. Celine Ahr - Room 4 - Thursday 6/1 at 12:45 Bathing/ Shower/ Hygiene: May shower and wash wound with soap and water. Edema Control - Lymphedema / SCD / Other: Elevate legs to the level of the heart or above for 30 minutes daily and/or when sitting, a frequency of: - throughout the day Avoid standing for long periods of time. Moisturize legs daily. WOUND #1: - Malleolus Wound Laterality: Left, Lateral Cleanser: Soap and Water 1 x Per Day/15 Days Discharge Instructions: May shower and wash wound with dial antibacterial soap and water prior to dressing change. Cleanser: Wound Cleanser 1 x Per Day/15 Days Discharge Instructions: Cleanse the wound with wound cleanser prior to applying a clean dressing using gauze sponges, not tissue or cotton balls. Prim Dressing: Promogran Prisma Matrix, 4.34 (sq in) (silver collagen) 1 x Per Day/15 Days ary Discharge Instructions: Moisten collagen with saline or hydrogel Secondary Dressing: ALLEVYN Gentle Border, 3x3 (in/in) 1 x Per Day/15 Days Discharge Instructions: Apply over primary dressing as directed. 02/07/2022: The ulcer is a bit smaller today with essentially no accumulation of slough in the base. No debridement was necessary today. Given the improvement in the wound surface, I am going to change his dressing to Prisma silver collagen. Follow-up in 1 week. Electronic Signature(s) Signed: 02/07/2022 5:30:29 PM By: Fredirick Maudlin MD FACS Entered By: Fredirick Maudlin on 02/07/2022  17:30:29 -------------------------------------------------------------------------------- HxROS Details Patient Name: Date of Service: John Lopes D. 02/07/2022 3:15 PM Medical Record Number: [id number] Patient Account Number: [id number] Date of Birth/Sex: Treating RN: 1929-03-28 (88 y.o. John Black Primary Care Provider: Jenna Luo Other Clinician: Referring Provider: Treating Provider/Extender: Mertha Finders in Treatment: 2 Information Obtained From Patient Caregiver Respiratory Medical History: Positive for: Chronic Obstructive Pulmonary Disease (COPD) Cardiovascular Medical History: Past Medical History Notes: TIA Gastrointestinal Medical History: Past Medical History Notes: inguinal hernia Endocrine Medical History: Negative for: Type I Diabetes; Type II Diabetes Past Medical History Notes: Hypothyroidism Musculoskeletal Medical History: Positive  for: Osteoarthritis Oncologic Medical History: Past Medical History Notes: Prostate cancer 20 years ago Immunizations Pneumococcal Vaccine: Received Pneumococcal Vaccination: Yes Received Pneumococcal Vaccination On or After 60th Birthday: No Implantable Devices No devices added Family and Social History Unknown History: Yes; Former smoker - quit 12 years ago; Marital Status - Married; Alcohol Use: Never; Drug Use: No History; Caffeine Use: Never; Financial Concerns: No; Food, Clothing or Shelter Needs: No; Support System Lacking: No; Transportation Concerns: No Electronic Signature(s) Signed: 02/07/2022 5:57:27 PM By: Levan Hurst RN, BSN Signed: 02/07/2022 6:00:25 PM By: Fredirick Maudlin MD FACS Entered By: Fredirick Maudlin on 02/07/2022 17:27:53 -------------------------------------------------------------------------------- [place] Details Patient Name: Date of Service: Margurite Auerbach 02/07/2022 Medical Record Number: [id number] Patient Account Number:  [id number] Date of Birth/Sex: Treating RN: Feb 14, 1929 (88 y.o. John Black Primary Care Provider: Jenna Luo Other Clinician: Referring Provider: Treating Provider/Extender: Mertha Finders in Treatment: 2 Diagnosis Coding ICD-10 Codes Code Description [phone] Pressure ulcer of left ankle, stage 2 S72.002S Fracture of unspecified part of neck of left femur, sequela J44.9 Chronic obstructive pulmonary disease, unspecified Facility Procedures CPT4 Code: [id number] Description: 99213 - WOUND CARE VISIT-LEV 3 EST PT Modifier: Quantity: 1 Physician Procedures : CPT4 Code Description Modifier [id number] - WC PHYS LEVEL 3 - EST PT ICD-10 Diagnosis Description L89.522 Pressure ulcer of left ankle, stage 2 S72.002S Fracture of unspecified part of neck of left femur, sequela J44.9 Chronic obstructive pulmonary  disease, unspecified Quantity: 1 Electronic Signature(s) Signed: 02/07/2022 5:57:27 PM By: Levan Hurst RN, BSN Signed: 02/07/2022 6:00:25 PM By: Fredirick Maudlin MD FACS Previous Signature: 02/07/2022 5:30:44 PM Version By: Fredirick Maudlin MD FACS Entered By: Levan Hurst on 02/07/2022 17:43:25

## 2022-02-08 ENCOUNTER — Telehealth: Payer: PPO

## 2022-02-13 NOTE — Progress Notes (Signed)
John, [name] ([id number]) Visit Report for 02/07/2022 Arrival Information Details Patient Name: Date of Service: CINCH, ORMOND 02/07/2022 3:15 PM Medical Record Number: [id number] Patient Account Number: [id number] Date of Birth/Sex: Treating RN: [birth year] (88 y.o. [name] Primary Care Jaskarn Schweer: Jenna Luo Other Clinician: Referring Whalen Trompeter: Treating Issak Goley/Extender: Mertha Finders in Treatment: 2 Visit Information History Since Last Visit Added or deleted any medications: No Patient Arrived: Wheel Chair Any new allergies or adverse reactions: No Arrival Time: 16:09 Had a fall or experienced change in No Accompanied By: family activities of daily living that may affect Transfer Assistance: None risk of falls: Patient Identification Verified: Yes Signs or symptoms of abuse/neglect since last visito No Secondary Verification Process Completed: Yes Hospitalized since last visit: No Patient Requires Transmission-Based Precautions: No Implantable device outside of the clinic excluding No Patient Has Alerts: Yes cellular tissue based products placed in the center Patient Alerts: R ABI: 0.85 since last visit: L ABI: 0.99 Has Dressing in Place as Prescribed: Yes Pain Present Now: No Electronic Signature(s) Signed: 02/13/2022 9:09:56 AM By: Sandre Kitty Entered By: Sandre Kitty on 02/07/2022 16:09:42 -------------------------------------------------------------------------------- Clinic Level of Care Assessment Details Patient Name: Date of Service: John, Black 02/07/2022 3:15 PM Medical Record Number: [id number] Patient Account Number: [id number] Date of Birth/Sex: Treating RN: Dec 13, 1928 (88 y.o. [name] Primary Care Zakariah Urwin: Jenna Luo Other Clinician: Referring Jacquelin Krajewski: Treating Kimyata Milich/Extender: Mertha Finders in Treatment: 2 Clinic Level of Care Assessment  Items TOOL 4 Quantity Score X- 1 0 Use when only an EandM is performed on FOLLOW-UP visit ASSESSMENTS - Nursing Assessment / Reassessment X- 1 10 Reassessment of Co-morbidities (includes updates in patient status) X- 1 5 Reassessment of Adherence to Treatment Plan ASSESSMENTS - Wound and Skin A ssessment / Reassessment X - Simple Wound Assessment / Reassessment - one wound 1 5 '[]'$  - 0 Complex Wound Assessment / Reassessment - multiple wounds '[]'$  - 0 Dermatologic / Skin Assessment (not related to wound area) ASSESSMENTS - Focused Assessment '[]'$  - 0 Circumferential Edema Measurements - multi extremities '[]'$  - 0 Nutritional Assessment / Counseling / Intervention X- 1 5 Lower Extremity Assessment (monofilament, tuning fork, pulses) '[]'$  - 0 Peripheral Arterial Disease Assessment (using hand held doppler) ASSESSMENTS - Ostomy and/or Continence Assessment and Care '[]'$  - 0 Incontinence Assessment and Management '[]'$  - 0 Ostomy Care Assessment and Management (repouching, etc.) PROCESS - Coordination of Care X - Simple Patient / Family Education for ongoing care 1 15 '[]'$  - 0 Complex (extensive) Patient / Family Education for ongoing care X- 1 10 Staff obtains Programmer, systems, Records, T Results / Process Orders est '[]'$  - 0 Staff telephones HHA, Nursing Homes / Clarify orders / etc '[]'$  - 0 Routine Transfer to another Facility (non-emergent condition) '[]'$  - 0 Routine Hospital Admission (non-emergent condition) '[]'$  - 0 New Admissions / Biomedical engineer / Ordering NPWT Apligraf, etc. , '[]'$  - 0 Emergency Hospital Admission (emergent condition) X- 1 10 Simple Discharge Coordination '[]'$  - 0 Complex (extensive) Discharge Coordination PROCESS - Special Needs '[]'$  - 0 Pediatric / Minor Patient Management '[]'$  - 0 Isolation Patient Management '[]'$  - 0 Hearing / Language / Visual special needs '[]'$  - 0 Assessment of Community assistance (transportation, D/C planning, etc.) '[]'$  - 0 Additional  assistance / Altered mentation '[]'$  - 0 Support Surface(s) Assessment (bed, cushion, seat, etc.) INTERVENTIONS - Wound Cleansing / Measurement X - Simple Wound Cleansing - one wound 1 5 '[]'$  -  0 Complex Wound Cleansing - multiple wounds X- 1 5 Wound Imaging (photographs - any number of wounds) '[]'$  - 0 Wound Tracing (instead of photographs) X- 1 5 Simple Wound Measurement - one wound '[]'$  - 0 Complex Wound Measurement - multiple wounds INTERVENTIONS - Wound Dressings X - Small Wound Dressing one or multiple wounds 1 10 '[]'$  - 0 Medium Wound Dressing one or multiple wounds '[]'$  - 0 Large Wound Dressing one or multiple wounds '[]'$  - 0 Application of Medications - topical '[]'$  - 0 Application of Medications - injection INTERVENTIONS - Miscellaneous '[]'$  - 0 External ear exam '[]'$  - 0 Specimen Collection (cultures, biopsies, blood, body fluids, etc.) '[]'$  - 0 Specimen(s) / Culture(s) sent or taken to Lab for analysis '[]'$  - 0 Patient Transfer (multiple staff / Civil Service fast streamer / Similar devices) '[]'$  - 0 Simple Staple / Suture removal (25 or less) '[]'$  - 0 Complex Staple / Suture removal (26 or more) '[]'$  - 0 Hypo / Hyperglycemic Management (close monitor of Blood Glucose) '[]'$  - 0 Ankle / Brachial Index (ABI) - do not check if billed separately X- 1 5 Vital Signs Has the patient been seen at the hospital within the last three years: Yes Total Score: 90 Level Of Care: New/Established - Level 3 Electronic Signature(s) Signed: 02/07/2022 5:57:27 PM By: Levan Hurst RN, BSN Entered By: Levan Hurst on 02/07/2022 17:43:16 -------------------------------------------------------------------------------- Encounter Discharge Information Details Patient Name: Date of Service: John Lopes D. 02/07/2022 3:15 PM Medical Record Number: [id number] Patient Account Number: [id number] Date of Birth/Sex: Treating RN: 03-22-1929 (88 y.o. [name] Primary Care Telena Peyser: Jenna Luo Other  Clinician: Referring Kabrina Christiano: Treating Kelden Lavallee/Extender: Mertha Finders in Treatment: 2 Encounter Discharge Information Items Discharge Condition: Stable Ambulatory Status: Wheelchair Discharge Destination: Home Transportation: Private Auto Accompanied By: wife and daughter Schedule Follow-up Appointment: Yes Clinical Summary of Care: Patient Declined Electronic Signature(s) Signed: 02/07/2022 5:57:27 PM By: Levan Hurst RN, BSN Entered By: Levan Hurst on 02/07/2022 17:44:17 -------------------------------------------------------------------------------- Lower Extremity Assessment Details Patient Name: Date of Service: John Lopes D. 02/07/2022 3:15 PM Medical Record Number: [id number] Patient Account Number: [id number] Date of Birth/Sex: Treating RN: 05-07-1929 (88 y.o. [name] Primary Care Alijah Akram: Jenna Luo Other Clinician: Referring Sariyah Corcino: Treating Duanne Duchesne/Extender: Mertha Finders in Treatment: 2 Edema Assessment Assessed: Shirlyn Goltz: No] [Right: No] Edema: [Left: Ye] [Right: s] Calf Left: Right: Point of Measurement: From Medial Instep 28 cm Ankle Left: Right: Point of Measurement: From Medial Instep 22 cm Vascular Assessment Pulses: Dorsalis Pedis Palpable: [Left:Yes] Electronic Signature(s) Signed: 02/07/2022 5:57:27 PM By: Levan Hurst RN, BSN Entered By: Levan Hurst on 02/07/2022 16:30:50 -------------------------------------------------------------------------------- Multi Wound Chart Details Patient Name: Date of Service: John Lopes D. 02/07/2022 3:15 PM Medical Record Number: [id number] Patient Account Number: [id number] Date of Birth/Sex: Treating RN: 1929-07-26 (88 y.o. [name] Primary Care Tea Collums: Jenna Luo Other Clinician: Referring Uzoma Vivona: Treating Lakeria Starkman/Extender: Mertha Finders in Treatment: 2 Vital  Signs Height(in): 60 Pulse(bpm): 79 Weight(lbs): 118 Blood Pressure(mmHg): 164/76 Body Mass Index(BMI): 20.3 Temperature(F): 98.1 Respiratory Rate(breaths/min): 16 Photos: [N/A:N/A] Left, Lateral Malleolus N/A N/A Wound Location: Gradually Appeared N/A N/A Wounding Event: Pressure Ulcer N/A N/A Primary Etiology: Chronic Obstructive Pulmonary N/A N/A Comorbid History: Disease (COPD), Osteoarthritis 01/16/2020 N/A N/A Date Acquired: 2 N/A N/A Weeks of Treatment: Open N/A N/A Wound Status: No N/A N/A Wound Recurrence: 0.3x0.3x0.1 N/A N/A Measurements L x W x D (cm) 0.071 N/A N/A A (cm) :  rea 0.007 N/A N/A Volume (cm) : 39.80% N/A N/A % Reduction in A rea: 41.70% N/A N/A % Reduction in Volume: Category/Stage III N/A N/A Classification: Medium N/A N/A Exudate A mount: Serous N/A N/A Exudate Type: amber N/A N/A Exudate Color: Flat and Intact N/A N/A Wound Margin: Large (67-100%) N/A N/A Granulation A mount: Pink, Pale N/A N/A Granulation Quality: Small (1-33%) N/A N/A Necrotic A mount: Fat Layer (Subcutaneous Tissue): Yes N/A N/A Exposed Structures: Fascia: No Tendon: No Muscle: No Joint: No Bone: No Small (1-33%) N/A N/A Epithelialization: Treatment Notes Electronic Signature(s) Signed: 02/07/2022 5:27:10 PM By: Fredirick Maudlin MD FACS Signed: 02/07/2022 5:57:27 PM By: Levan Hurst RN, BSN Entered By: Fredirick Maudlin on 02/07/2022 17:27:10 -------------------------------------------------------------------------------- Multi-Disciplinary Care Plan Details Patient Name: Date of Service: John Lopes D. 02/07/2022 3:15 PM Medical Record Number: [id number] Patient Account Number: [id number] Date of Birth/Sex: Treating RN: 25-Jul-1929 (88 y.o. [name] Primary Care Keagan Anthis: Jenna Luo Other Clinician: Referring Jalal Rauch: Treating Yarelly Kuba/Extender: Mertha Finders in Treatment: 2 Multidisciplinary  Care Plan reviewed with physician Active Inactive Abuse / Safety / Falls / Self Care Management Nursing Diagnoses: History of Falls Potential for falls Goals: Patient will not experience any injury related to falls Date Initiated: 01/23/2022 Target Resolution Date: 02/23/2022 Goal Status: Active Patient/caregiver will verbalize/demonstrate measures taken to prevent injury and/or falls Date Initiated: 01/23/2022 Target Resolution Date: 02/23/2022 Goal Status: Active Interventions: Assess Activities of Daily Living upon admission and as needed Assess fall risk on admission and as needed Assess: immobility, friction, shearing, incontinence upon admission and as needed Assess impairment of mobility on admission and as needed per policy Assess personal safety and home safety (as indicated) on admission and as needed Assess self care needs on admission and as needed Provide education on fall prevention Provide education on personal and home safety Notes: Pressure Nursing Diagnoses: Knowledge deficit related to causes and risk factors for pressure ulcer development Knowledge deficit related to management of pressures ulcers Potential for impaired tissue integrity related to pressure, friction, moisture, and shear Goals: Patient/caregiver will verbalize risk factors for pressure ulcer development Date Initiated: 01/23/2022 Target Resolution Date: 02/23/2022 Goal Status: Active Patient/caregiver will verbalize understanding of pressure ulcer management Date Initiated: 01/23/2022 Target Resolution Date: 02/23/2022 Goal Status: Active Interventions: Assess: immobility, friction, shearing, incontinence upon admission and as needed Assess offloading mechanisms upon admission and as needed Assess potential for pressure ulcer upon admission and as needed Provide education on pressure ulcers Notes: Wound/Skin Impairment Nursing Diagnoses: Impaired tissue integrity Knowledge deficit related to  ulceration/compromised skin integrity Goals: Patient/caregiver will verbalize understanding of skin care regimen Date Initiated: 01/23/2022 Target Resolution Date: 02/23/2022 Goal Status: Active Ulcer/skin breakdown will have a volume reduction of 30% by week 4 Date Initiated: 01/23/2022 Target Resolution Date: 02/23/2022 Goal Status: Active Interventions: Assess patient/caregiver ability to obtain necessary supplies Assess patient/caregiver ability to perform ulcer/skin care regimen upon admission and as needed Assess ulceration(s) every visit Provide education on ulcer and skin care Notes: Electronic Signature(s) Signed: 02/07/2022 5:57:27 PM By: Levan Hurst RN, BSN Entered By: Levan Hurst on 02/07/2022 16:35:41 -------------------------------------------------------------------------------- Pain Assessment Details Patient Name: Date of Service: John Lopes D. 02/07/2022 3:15 PM Medical Record Number: [id number] Patient Account Number: [id number] Date of Birth/Sex: Treating RN: [birth year] (88 y.o. [name] Primary Care Adilen Pavelko: Jenna Luo Other Clinician: Referring Corion Sherrod: Treating Antwoine Zorn/Extender: Mertha Finders in Treatment: 2 Active Problems Location of Pain Severity and Description of Pain Patient Has  Paino No Site Locations Pain Management and Medication Current Pain Management: Electronic Signature(s) Signed: 02/07/2022 5:57:27 PM By: Levan Hurst RN, BSN Signed: 02/13/2022 9:09:56 AM By: Sandre Kitty Entered By: Sandre Kitty on 02/07/2022 16:10:04 -------------------------------------------------------------------------------- Patient/Caregiver Education Details Patient Name: Date of Service: Margurite Auerbach 5/24/2023andnbsp3:15 PM Medical Record Number: [id number] Patient Account Number: [id number] Date of Birth/Gender: Treating RN: August 05, 1929 (88 y.o. [name] Primary Care Physician:  Jenna Luo Other Clinician: Referring Physician: Treating Physician/Extender: Mertha Finders in Treatment: 2 Education Assessment Education Provided To: Patient Education Topics Provided Wound/Skin Impairment: Methods: Explain/Verbal Responses: State content correctly Electronic Signature(s) Signed: 02/07/2022 5:57:27 PM By: Levan Hurst RN, BSN Entered By: Levan Hurst on 02/07/2022 16:35:59 -------------------------------------------------------------------------------- Wound Assessment Details Patient Name: Date of Service: John Lopes D. 02/07/2022 3:15 PM Medical Record Number: [id number] Patient Account Number: [id number] Date of Birth/Sex: Treating RN: 1929-03-24 (88 y.o. [name] Primary Care Lavonia Eager: Jenna Luo Other Clinician: Referring Saphire Barnhart: Treating Leitha Hyppolite/Extender: Mertha Finders in Treatment: 2 Wound Status Wound Number: 1 Primary Etiology: Pressure Ulcer Wound Location: Left, Lateral Malleolus Wound Status: Open Wounding Event: Gradually Appeared Comorbid Chronic Obstructive Pulmonary Disease (COPD), History: Osteoarthritis Date Acquired: 01/16/2020 Weeks Of Treatment: 2 Clustered Wound: No Photos Wound Measurements Length: (cm) 0.3 Width: (cm) 0.3 Depth: (cm) 0.1 Area: (cm) 0.071 Volume: (cm) 0.007 % Reduction in Area: 39.8% % Reduction in Volume: 41.7% Epithelialization: Small (1-33%) Wound Description Classification: Category/Stage III Wound Margin: Flat and Intact Exudate Amount: Medium Exudate Type: Serous Exudate Color: amber Foul Odor After Cleansing: No Slough/Fibrino Yes Wound Bed Granulation Amount: Large (67-100%) Exposed Structure Granulation Quality: Pink, Pale Fascia Exposed: No Necrotic Amount: Small (1-33%) Fat Layer (Subcutaneous Tissue) Exposed: Yes Necrotic Quality: Adherent Slough Tendon Exposed: No Muscle Exposed: No Joint Exposed:  No Bone Exposed: No Treatment Notes Wound #1 (Malleolus) Wound Laterality: Left, Lateral Cleanser Soap and Water Discharge Instruction: May shower and wash wound with dial antibacterial soap and water prior to dressing change. Wound Cleanser Discharge Instruction: Cleanse the wound with wound cleanser prior to applying a clean dressing using gauze sponges, not tissue or cotton balls. Peri-Wound Care Topical Primary Dressing Promogran Prisma Matrix, 4.34 (sq in) (silver collagen) Discharge Instruction: Moisten collagen with saline or hydrogel Secondary Dressing ALLEVYN Gentle Border, 3x3 (in/in) Discharge Instruction: Apply over primary dressing as directed. Secured With Compression Wrap Compression Stockings Environmental education officer) Signed: 02/07/2022 5:57:27 PM By: Levan Hurst RN, BSN Signed: 02/13/2022 9:09:56 AM By: Sandre Kitty Entered By: Sandre Kitty on 02/07/2022 16:12:29 -------------------------------------------------------------------------------- Vitals Details Patient Name: Date of Service: John Lopes D. 02/07/2022 3:15 PM Medical Record Number: [id number] Patient Account Number: [id number] Date of Birth/Sex: Treating RN: [birth year] (88 y.o. [name] Primary Care Marshal Schrecengost: Jenna Luo Other Clinician: Referring Buddy Loeffelholz: Treating Ryzen Deady/Extender: Mertha Finders in Treatment: 2 Vital Signs Time Taken: 16:09 Temperature (F): 98.1 Height (in): 64 Pulse (bpm): 90 Weight (lbs): 118 Respiratory Rate (breaths/min): 16 Body Mass Index (BMI): 20.3 Blood Pressure (mmHg): 164/76 Reference Range: 80 - 120 mg / dl Electronic Signature(s) Signed: 02/13/2022 9:09:56 AM By: Sandre Kitty Entered By: Sandre Kitty on 02/07/2022 16:09:57

## 2022-02-15 ENCOUNTER — Encounter (HOSPITAL_BASED_OUTPATIENT_CLINIC_OR_DEPARTMENT_OTHER): Payer: PPO | Attending: General Surgery | Admitting: General Surgery

## 2022-02-15 DIAGNOSIS — Z9981 Dependence on supplemental oxygen: Secondary | ICD-10-CM | POA: Diagnosis not present

## 2022-02-15 DIAGNOSIS — J449 Chronic obstructive pulmonary disease, unspecified: Secondary | ICD-10-CM | POA: Insufficient documentation

## 2022-02-15 DIAGNOSIS — L89522 Pressure ulcer of left ankle, stage 2: Secondary | ICD-10-CM | POA: Insufficient documentation

## 2022-02-15 DIAGNOSIS — Z87891 Personal history of nicotine dependence: Secondary | ICD-10-CM | POA: Diagnosis not present

## 2022-02-15 DIAGNOSIS — L97322 Non-pressure chronic ulcer of left ankle with fat layer exposed: Secondary | ICD-10-CM | POA: Diagnosis not present

## 2022-02-15 DIAGNOSIS — L89323 Pressure ulcer of left buttock, stage 3: Secondary | ICD-10-CM | POA: Diagnosis not present

## 2022-02-15 NOTE — Progress Notes (Signed)
ADAMA, FERBER ([id number]) Visit Report for 02/15/2022 Chief Complaint Document Details Patient Name: Date of Service: John Black, John Black 02/15/2022 12:45 PM Medical Record Number: [id number] Patient Account Number: [id number] Date of Birth/Sex: Treating RN: [birth year] (88 y.o. [name] Primary Care Provider: Jenna Luo Other Clinician: Referring Provider: Treating Provider/Extender: Mertha Finders in Treatment: 3 Information Obtained from: Patient Chief Complaint Patient is at the clinic for treatment of an open pressure ulcer of his left lateral malleolus Electronic Signature(s) Signed: 02/15/2022 1:49:22 PM By: Fredirick Maudlin MD FACS Entered By: Fredirick Maudlin on 02/15/2022 13:49:22 -------------------------------------------------------------------------------- Debridement Details Patient Name: Date of Service: John Lopes D. 02/15/2022 12:45 PM Medical Record Number: [id number] Patient Account Number: [id number] Date of Birth/Sex: Treating RN: 1929-09-13 (88 y.o. Mare Ferrari Primary Care Provider: Jenna Luo Other Clinician: Referring Provider: Treating Provider/Extender: Mertha Finders in Treatment: 3 Debridement Performed for Assessment: Wound #1 Left,Lateral Malleolus Performed By: Physician Fredirick Maudlin, MD Debridement Type: Debridement Level of Consciousness (Pre-procedure): Awake and Alert Pre-procedure Verification/Time Out Yes - 13:26 Taken: Start Time: 13:26 T Area Debrided (L x W): otal 0.3 (cm) x 0.4 (cm) = 0.12 (cm) Tissue and other material debrided: Non-Viable, [place] Level: Non-Viable Tissue Debridement Description: Selective/Open Wound Instrument: Curette Bleeding: Minimum Hemostasis Achieved: Pressure End Time: 13:27 Procedural Pain: 0 Post Procedural Pain: 0 Response to Treatment: Procedure was tolerated well Level of Consciousness (Post- Awake and  Alert procedure): Post Debridement Measurements of Total Wound Length: (cm) 0.3 Stage: Category/Stage III Width: (cm) 0.4 Depth: (cm) 0.1 Volume: (cm) 0.009 Character of Wound/Ulcer Post Debridement: Improved Post Procedure Diagnosis Same as Pre-procedure Electronic Signature(s) Signed: 02/15/2022 4:49:27 PM By: Fredirick Maudlin MD FACS Signed: 02/15/2022 5:59:26 PM By: Sharyn Creamer RN, BSN Entered By: Sharyn Creamer on 02/15/2022 13:27:32 -------------------------------------------------------------------------------- HPI Details Patient Name: Date of Service: John Lopes D. 02/15/2022 12:45 PM Medical Record Number: [id number] Patient Account Number: [id number] Date of Birth/Sex: Treating RN: 15-Apr-1929 (88 y.o. [name] Primary Care Provider: Jenna Luo Other Clinician: Referring Provider: Treating Provider/Extender: Mertha Finders in Treatment: 3 History of Present Illness HPI Description: ADMISSION 01/23/2022 This is a 88 year old man who suffered a hip fracture in 2022. During the time in his rehabilitation facility, he developed a small pressure sore on his left lateral ankle. He has been treated at an outside podiatrist for over a year without any significant healing. He is not diabetic. He is oxygen dependent secondary to COPD and also takes prednisone for his lung disease. He quit smoking quite some time ago. ABI performed at the [facility] vascular lab demonstrated a right ABI of 0.85 and a left ABI of 0.99. There is a small ulcer on his left lateral malleolus. It is circular and has some slough at the base. Apparently just last week, the podiatrist they were seeing changed him from Aquacel to [facility]. He has specialty stockings that have padding over the lateral and medial malleoli and he is wearing these at night. 01/31/2022: The wound on his left lateral malleolus is a little bit smaller today. It still has accumulated slough at  the base. No concern for infection. 02/07/2022: The wound is smaller and cleaner with no real accumulation of slough. We have been using Santyl. 02/15/2022: Although the wound measured the same size today on intake, it visually appears smaller. There is just a little bit of slough on the surface, underneath which the wound has a nice bed  of granulation tissue. We switched his dressing to silver collagen last week. Electronic Signature(s) Signed: 02/15/2022 1:50:12 PM By: Fredirick Maudlin MD FACS Entered By: Fredirick Maudlin on 02/15/2022 13:50:11 -------------------------------------------------------------------------------- Physical Exam Details Patient Name: Date of Service: John Lopes D. 02/15/2022 12:45 PM Medical Record Number: [id number] Patient Account Number: [id number] Date of Birth/Sex: Treating RN: 01/25/1929 (88 y.o. [name] Primary Care Provider: Jenna Luo Other Clinician: Referring Provider: Treating Provider/Extender: Mertha Finders in Treatment: 3 Constitutional . . . . No acute distress. Respiratory Normal work of breathing on room air. Notes 02/15/2022: Although the wound measured the same size today on intake, it visually appears smaller. There is just a little bit of slough on the surface, underneath which the wound has a nice bed of granulation tissue. Electronic Signature(s) Signed: 02/15/2022 1:50:43 PM By: Fredirick Maudlin MD FACS Entered By: Fredirick Maudlin on 02/15/2022 13:50:43 -------------------------------------------------------------------------------- Physician Orders Details Patient Name: Date of Service: John Lopes D. 02/15/2022 12:45 PM Medical Record Number: [id number] Patient Account Number: [id number] Date of Birth/Sex: Treating RN: 1928-09-25 (88 y.o. Mare Ferrari Primary Care Provider: Jenna Luo Other Clinician: Referring Provider: Treating Provider/Extender: Mertha Finders in Treatment: 3 Verbal / Phone Orders: No Diagnosis Coding ICD-10 Coding Code Description [phone] Pressure ulcer of left ankle, stage 2 S72.002S Fracture of unspecified part of neck of left femur, sequela J44.9 Chronic obstructive pulmonary disease, unspecified Follow-up Appointments ppointment in 1 week. - Dr. Celine Ahr - Room 4 - Thursday 6/8 at 12:45 Return A Bathing/ Shower/ Hygiene May shower and wash wound with soap and water. Edema Control - Lymphedema / SCD / Other Elevate legs to the level of the heart or above for 30 minutes daily and/or when sitting, a frequency of: - throughout the day Avoid standing for long periods of time. Moisturize legs daily. Wound Treatment Wound #1 - Malleolus Wound Laterality: Left, Lateral Cleanser: Soap and Water 1 x Per YCX/44 Days Discharge Instructions: May shower and wash wound with dial antibacterial soap and water prior to dressing change. Cleanser: Wound Cleanser 1 x Per Day/15 Days Discharge Instructions: Cleanse the wound with wound cleanser prior to applying a clean dressing using gauze sponges, not tissue or cotton balls. Prim Dressing: Promogran Prisma Matrix, 4.34 (sq in) (silver collagen) 1 x Per Day/15 Days ary Discharge Instructions: Moisten collagen with saline or hydrogel Secondary Dressing: ALLEVYN Gentle Border, 3x3 (in/in) 1 x Per Day/15 Days Discharge Instructions: Apply over primary dressing as directed. Electronic Signature(s) Signed: 02/15/2022 4:49:27 PM By: Fredirick Maudlin MD FACS Entered By: Fredirick Maudlin on 02/15/2022 13:50:51 -------------------------------------------------------------------------------- Problem List Details Patient Name: Date of Service: John Lopes D. 02/15/2022 12:45 PM Medical Record Number: [id number] Patient Account Number: [id number] Date of Birth/Sex: Treating RN: [birth year] (88 y.o. Mare Ferrari Primary Care Provider: Jenna Luo Other  Clinician: Referring Provider: Treating Provider/Extender: Mertha Finders in Treatment: 3 Active Problems ICD-10 Encounter Code Description Active Date MDM Diagnosis [phone] Pressure ulcer of left ankle, stage 2 01/23/2022 No Yes S72.002S Fracture of unspecified part of neck of left femur, sequela 01/23/2022 No Yes J44.9 Chronic obstructive pulmonary disease, unspecified 01/23/2022 No Yes Inactive Problems Resolved Problems Electronic Signature(s) Signed: 02/15/2022 1:49:10 PM By: Fredirick Maudlin MD FACS Entered By: Fredirick Maudlin on 02/15/2022 13:49:09 -------------------------------------------------------------------------------- Progress Note Details Patient Name: Date of Service: John Lopes D. 02/15/2022 12:45 PM Medical Record Number: [id number] Patient Account Number: [id number] Date of Birth/Sex: Treating RN: Oct 17, 1928 (88  y.o. Jerilynn Mages) Levan Hurst Primary Care Provider: Jenna Luo Other Clinician: Referring Provider: Treating Provider/Extender: Mertha Finders in Treatment: 3 Subjective Chief Complaint Information obtained from Patient Patient is at the clinic for treatment of an open pressure ulcer of his left lateral malleolus History of Present Illness (HPI) ADMISSION 01/23/2022 This is a 88 year old man who suffered a hip fracture in 2022. During the time in his rehabilitation facility, he developed a small pressure sore on his left lateral ankle. He has been treated at an outside podiatrist for over a year without any significant healing. He is not diabetic. He is oxygen dependent secondary to COPD and also takes prednisone for his lung disease. He quit smoking quite some time ago. ABI performed at the [facility] vascular lab demonstrated a right ABI of 0.85 and a left ABI of 0.99. There is a small ulcer on his left lateral malleolus. It is circular and has some slough at the base. Apparently just last week, the  podiatrist they were seeing changed him from Aquacel to [facility]. He has specialty stockings that have padding over the lateral and medial malleoli and he is wearing these at night. 01/31/2022: The wound on his left lateral malleolus is a little bit smaller today. It still has accumulated slough at the base. No concern for infection. 02/07/2022: The wound is smaller and cleaner with no real accumulation of slough. We have been using Santyl. 02/15/2022: Although the wound measured the same size today on intake, it visually appears smaller. There is just a little bit of slough on the surface, underneath which the wound has a nice bed of granulation tissue. We switched his dressing to silver collagen last week. Patient History Information obtained from Patient, Caregiver. Family History Unknown History. Social History Former smoker - quit 12 years ago, Marital Status - Married, Alcohol Use - Never, Drug Use - No History, Caffeine Use - Never. Medical History Respiratory Patient has history of Chronic Obstructive Pulmonary Disease (COPD) Endocrine Denies history of Type I Diabetes, Type II Diabetes Musculoskeletal Patient has history of Osteoarthritis Medical A Surgical History Notes nd Cardiovascular TIA Gastrointestinal inguinal hernia Endocrine Hypothyroidism Oncologic Prostate cancer 20 years ago Objective Constitutional No acute distress. Vitals Time Taken: 1:11 PM, Height: 64 in, Weight: 118 lbs, BMI: 20.3, Temperature: 98.2 F, Pulse: 88 bpm, Respiratory Rate: 18 breaths/min, Blood Pressure: 118/74 mmHg. Respiratory Normal work of breathing on room air. General Notes: 02/15/2022: Although the wound measured the same size today on intake, it visually appears smaller. There is just a little bit of slough on the surface, underneath which the wound has a nice bed of granulation tissue. Integumentary (Hair, Skin) Wound #1 status is Open. Original cause of wound was Gradually Appeared.  The date acquired was: 01/16/2020. The wound has been in treatment 3 weeks. The wound is located on the Left,Lateral Malleolus. The wound measures 0.3cm length x 0.4cm width x 0.1cm depth; 0.094cm^2 area and 0.009cm^3 volume. There is Fat Layer (Subcutaneous Tissue) exposed. There is no undermining noted. There is a medium amount of serous drainage noted. The wound margin is flat and intact. There is large (67-100%) pink, pale granulation within the wound bed. There is a small (1-33%) amount of necrotic tissue within the wound bed including Adherent Slough. Assessment Active Problems ICD-10 Pressure ulcer of left ankle, stage 2 Fracture of unspecified part of neck of left femur, sequela Chronic obstructive pulmonary disease, unspecified Procedures Wound #1 Pre-procedure diagnosis of Wound #1 is a Pressure  Ulcer located on the Left,Lateral Malleolus . There was a Selective/Open Wound Non-Viable Tissue Debridement with a total area of 0.12 sq cm performed by Fredirick Maudlin, MD. With the following instrument(s): Curette to remove Non-Viable tissue/material. Material removed includes [facility]. No specimens were taken. A time out was conducted at 13:26, prior to the start of the procedure. A Minimum amount of bleeding was controlled with Pressure. The procedure was tolerated well with a pain level of 0 throughout and a pain level of 0 following the procedure. Post Debridement Measurements: 0.3cm length x 0.4cm width x 0.1cm depth; 0.009cm^3 volume. Post debridement Stage noted as Category/Stage III. Character of Wound/Ulcer Post Debridement is improved. Post procedure Diagnosis Wound #1: Same as Pre-Procedure Plan Follow-up Appointments: Return Appointment in 1 week. - Dr. Celine Ahr - Room 4 - Thursday 6/8 at 12:45 Bathing/ Shower/ Hygiene: May shower and wash wound with soap and water. Edema Control - Lymphedema / SCD / Other: Elevate legs to the level of the heart or above for 30 minutes daily  and/or when sitting, a frequency of: - throughout the day Avoid standing for long periods of time. Moisturize legs daily. WOUND #1: - Malleolus Wound Laterality: Left, Lateral Cleanser: Soap and Water 1 x Per Day/15 Days Discharge Instructions: May shower and wash wound with dial antibacterial soap and water prior to dressing change. Cleanser: Wound Cleanser 1 x Per Day/15 Days Discharge Instructions: Cleanse the wound with wound cleanser prior to applying a clean dressing using gauze sponges, not tissue or cotton balls. Prim Dressing: Promogran Prisma Matrix, 4.34 (sq in) (silver collagen) 1 x Per Day/15 Days ary Discharge Instructions: Moisten collagen with saline or hydrogel Secondary Dressing: ALLEVYN Gentle Border, 3x3 (in/in) 1 x Per Day/15 Days Discharge Instructions: Apply over primary dressing as directed. 02/15/2022: Although the wound measured the same size today on intake, it visually appears smaller. There is just a little bit of slough on the surface, underneath which the wound has a nice bed of granulation tissue. I used a curette to debride the slough from the wound surface. We will continue using Prisma silver collagen. Follow-up in 1 week. Electronic Signature(s) Signed: 02/15/2022 1:51:16 PM By: Fredirick Maudlin MD FACS Entered By: Fredirick Maudlin on 02/15/2022 13:51:16 -------------------------------------------------------------------------------- HxROS Details Patient Name: Date of Service: John Lopes D. 02/15/2022 12:45 PM Medical Record Number: [id number] Patient Account Number: [id number] Date of Birth/Sex: Treating RN: Oct 03, 1928 (88 y.o. [name] Primary Care Provider: Jenna Luo Other Clinician: Referring Provider: Treating Provider/Extender: Mertha Finders in Treatment: 3 Information Obtained From Patient Caregiver Respiratory Medical History: Positive for: Chronic Obstructive Pulmonary Disease  (COPD) Cardiovascular Medical History: Past Medical History Notes: TIA Gastrointestinal Medical History: Past Medical History Notes: inguinal hernia Endocrine Medical History: Negative for: Type I Diabetes; Type II Diabetes Past Medical History Notes: Hypothyroidism Musculoskeletal Medical History: Positive for: Osteoarthritis Oncologic Medical History: Past Medical History Notes: Prostate cancer 20 years ago Immunizations Pneumococcal Vaccine: Received Pneumococcal Vaccination: Yes Received Pneumococcal Vaccination On or After 60th Birthday: No Implantable Devices No devices added Family and Social History Unknown History: Yes; Former smoker - quit 12 years ago; Marital Status - Married; Alcohol Use: Never; Drug Use: No History; Caffeine Use: Never; Financial Concerns: No; Food, Clothing or Shelter Needs: No; Support System Lacking: No; Transportation Concerns: No Electronic Signature(s) Signed: 02/15/2022 4:49:27 PM By: Fredirick Maudlin MD FACS Signed: 02/15/2022 6:12:29 PM By: Levan Hurst RN, BSN Entered By: Fredirick Maudlin on 02/15/2022 13:50:18 -------------------------------------------------------------------------------- SuperBill Details Patient  Name: Date of Service: GEMAYEL, MASCIO 02/15/2022 Medical Record Number: [id number] Patient Account Number: [id number] Date of Birth/Sex: Treating RN: 11-10-1928 (88 y.o. [name] Primary Care Provider: Jenna Luo Other Clinician: Referring Provider: Treating Provider/Extender: Mertha Finders in Treatment: 3 Diagnosis Coding ICD-10 Codes Code Description [phone] Pressure ulcer of left ankle, stage 2 S72.002S Fracture of unspecified part of neck of left femur, sequela J44.9 Chronic obstructive pulmonary disease, unspecified Facility Procedures CPT4 Code: [id number] Description: [phone] - DEBRIDE WOUND 1ST 20 SQ CM OR < ICD-10 Diagnosis Description L89.522 Pressure ulcer of  left ankle, stage 2 Modifier: Quantity: 1 Physician Procedures : CPT4 Code Description Modifier [id number] - WC PHYS LEVEL 3 - EST PT 25 ICD-10 Diagnosis Description L89.522 Pressure ulcer of left ankle, stage 2 S72.002S Fracture of unspecified part of neck of left femur, sequela J44.9 Chronic obstructive  pulmonary disease, unspecified Quantity: 1 Electronic Signature(s) Signed: 02/15/2022 1:51:33 PM By: Fredirick Maudlin MD FACS Entered By: Fredirick Maudlin on 02/15/2022 13:51:32

## 2022-02-15 NOTE — Progress Notes (Addendum)
John Black, JACHIM ([id number]) Visit Report for 02/15/2022 Arrival Information Details Patient Name: Date of Service: John Black, John Black 02/15/2022 12:45 PM Medical Record Number: [id number] Patient Account Number: [id number] Date of Birth/Sex: Treating RN: [birth year] (88 y.o. Mare Ferrari Primary Care Aidon Klemens: Jenna Luo Other Clinician: Referring Arvis Zwahlen: Treating Shelva Hetzer/Extender: Mertha Finders in Treatment: 3 Visit Information History Since Last Visit Added or deleted any medications: No Patient Arrived: Wheel Chair Any new allergies or adverse reactions: No Arrival Time: 13:11 Had a fall or experienced change in No Accompanied By: wife activities of daily living that may affect Transfer Assistance: Manual risk of falls: Patient Identification Verified: Yes Signs or symptoms of abuse/neglect since last visito No Secondary Verification Process Completed: Yes Hospitalized since last visit: No Patient Requires Transmission-Based Precautions: No Implantable device outside of the clinic excluding No Patient Has Alerts: Yes cellular tissue based products placed in the center Patient Alerts: R ABI: 0.85 since last visit: L ABI: 0.99 Has Dressing in Place as Prescribed: Yes Pain Present Now: No Electronic Signature(s) Signed: 02/15/2022 5:59:26 PM By: Sharyn Creamer RN, BSN Entered By: Sharyn Creamer on 02/15/2022 13:15:28 -------------------------------------------------------------------------------- Encounter Discharge Information Details Patient Name: Date of Service: John Lopes D. 02/15/2022 12:45 PM Medical Record Number: [id number] Patient Account Number: [id number] Date of Birth/Sex: Treating RN: [birth year] (88 y.o. John Black Primary Care Quashaun Lazalde: Jenna Luo Other Clinician: Referring Sarahjane Matherly: Treating Lailoni Baquera/Extender: Mertha Finders in Treatment: 3 Encounter Discharge Information  Items Post Procedure Vitals Discharge Condition: Stable Temperature (F): 98.2 Ambulatory Status: Wheelchair Pulse (bpm): 88 Discharge Destination: Home Respiratory Rate (breaths/min): 18 Transportation: Private Auto Blood Pressure (mmHg): 118/74 Accompanied By: wife and daughter Schedule Follow-up Appointment: Yes Clinical Summary of Care: Patient Declined Electronic Signature(s) Signed: 02/15/2022 6:12:29 PM By: Levan Hurst RN, BSN Entered By: Levan Hurst on 02/15/2022 18:07:58 -------------------------------------------------------------------------------- Lower Extremity Assessment Details Patient Name: Date of Service: John Lopes D. 02/15/2022 12:45 PM Medical Record Number: [id number] Patient Account Number: [id number] Date of Birth/Sex: Treating RN: 1929/03/27 (88 y.o. Mare Ferrari Primary Care Larisha Vencill: Jenna Luo Other Clinician: Referring Xsavier Seeley: Treating Haleem Hanner/Extender: Mertha Finders in Treatment: 3 Edema Assessment Assessed: [Left: No] [Right: No] Edema: [Left: Ye] [Right: s] Calf Left: Right: Point of Measurement: From Medial Instep 29 cm Ankle Left: Right: Point of Measurement: From Medial Instep 21.8 cm Vascular Assessment Pulses: Dorsalis Pedis Palpable: [Left:Yes] Electronic Signature(s) Signed: 02/15/2022 5:59:26 PM By: Sharyn Creamer RN, BSN Entered By: Sharyn Creamer on 02/15/2022 13:14:48 -------------------------------------------------------------------------------- Multi Wound Chart Details Patient Name: Date of Service: John Lopes D. 02/15/2022 12:45 PM Medical Record Number: [id number] Patient Account Number: [id number] Date of Birth/Sex: Treating RN: August 07, 1929 (88 y.o. John Black Primary Care Veryl Abril: Jenna Luo Other Clinician: Referring Madinah Quarry: Treating Ezell Melikian/Extender: Mertha Finders in Treatment: 3 Vital Signs Height(in):  19 Pulse(bpm): 58 Weight(lbs): 118 Blood Pressure(mmHg): 118/74 Body Mass Index(BMI): 20.3 Temperature(F): 98.2 Respiratory Rate(breaths/min): 18 Photos: [1:Left, Lateral Malleolus] [N/A:N/A N/A] Wound Location: [1:Gradually Appeared] [N/A:N/A] Wounding Event: [1:Pressure Ulcer] [N/A:N/A] Primary Etiology: [1:Chronic Obstructive Pulmonary] [N/A:N/A] Comorbid History: [1:Disease (COPD), Osteoarthritis 01/16/2020] [N/A:N/A] Date Acquired: [1:3] [N/A:N/A] Weeks of Treatment: [1:Open] [N/A:N/A] Wound Status: [1:No] [N/A:N/A] Wound Recurrence: [1:0.3x0.4x0.1] [N/A:N/A] Measurements L x W x D (cm) [1:0.094] [N/A:N/A] A (cm) : rea [1:0.009] [N/A:N/A] Volume (cm) : [1:20.30%] [N/A:N/A] % Reduction in A [1:rea: 25.00%] [N/A:N/A] % Reduction in Volume: [1:Category/Stage III] [N/A:N/A] Classification: [1:Medium] [N/A:N/A] Exudate A mount: [1:Serous] [N/A:N/A] Exudate  Type: [1:amber] [N/A:N/A] Exudate Color: [1:Flat and Intact] [N/A:N/A] Wound Margin: [1:Large (67-100%)] [N/A:N/A] Granulation A mount: [1:Pink, Pale] [N/A:N/A] Granulation Quality: [1:Small (1-33%)] [N/A:N/A] Necrotic A mount: [1:Fat Layer (Subcutaneous Tissue): Yes N/A] Exposed Structures: [1:Fascia: No Tendon: No Muscle: No Joint: No Bone: No Small (1-33%)] [N/A:N/A] Epithelialization: [1:Debridement - Selective/Open Wound N/A] Debridement: Pre-procedure Verification/Time Out 13:26 [N/A:N/A] Taken: [1:Slough] [N/A:N/A] Tissue Debrided: [1:Non-Viable Tissue] [N/A:N/A] Level: [1:0.12] [N/A:N/A] Debridement A (sq cm): [1:rea Curette] [N/A:N/A] Instrument: [1:Minimum] [N/A:N/A] Bleeding: [1:Pressure] [N/A:N/A] Hemostasis A chieved: [1:0] [N/A:N/A] Procedural Pain: [1:0] [N/A:N/A] Post Procedural Pain: [1:Procedure was tolerated well] [N/A:N/A] Debridement Treatment Response: [1:0.3x0.4x0.1] [N/A:N/A] Post Debridement Measurements L x W x D (cm) [1:0.009] [N/A:N/A] Post Debridement Volume: (cm) [1:Category/Stage  III] [N/A:N/A] Post Debridement Stage: [1:Debridement] [N/A:N/A] Treatment Notes Electronic Signature(s) Signed: 02/15/2022 1:49:14 PM By: Fredirick Maudlin MD FACS Signed: 02/15/2022 6:12:29 PM By: Levan Hurst RN, BSN Entered By: Fredirick Maudlin on 02/15/2022 13:49:14 -------------------------------------------------------------------------------- Multi-Disciplinary Care Plan Details Patient Name: Date of Service: John Lopes D. 02/15/2022 12:45 PM Medical Record Number: [id number] Patient Account Number: [id number] Date of Birth/Sex: Treating RN: 04-17-1929 (88 y.o. Mare Ferrari Primary Care Kaylynne Andres: Jenna Luo Other Clinician: Referring Calie Buttrey: Treating Elyssa Pendelton/Extender: Mertha Finders in Treatment: 3 [place] reviewed with physician Active Inactive Electronic Signature(s) Signed: 03/28/2022 10:33:22 AM By: Baruch Gouty RN, BSN Signed: 06/08/2022 10:28:39 AM By: Sharyn Creamer RN, BSN Previous Signature: 02/15/2022 5:59:26 PM Version By: Sharyn Creamer RN, BSN Entered By: Baruch Gouty on 03/13/2022 11:43:45 -------------------------------------------------------------------------------- Pain Assessment Details Patient Name: Date of Service: JOVANNIE, ULIBARRI 02/15/2022 12:45 PM Medical Record Number: [id number] Patient Account Number: [id number] Date of Birth/Sex: Treating RN: Apr 29, 1929 (88 y.o. Mare Ferrari Primary Care Novah Nessel: Jenna Luo Other Clinician: Referring Zakyla Tonche: Treating Kyran Connaughton/Extender: Mertha Finders in Treatment: 3 Active Problems Location of Pain Severity and Description of Pain Patient Has Paino No Site Locations Pain Management and Medication Current Pain Management: Electronic Signature(s) Signed: 02/15/2022 5:59:26 PM By: Sharyn Creamer RN, BSN Entered By: Sharyn Creamer on 02/15/2022  13:14:54 -------------------------------------------------------------------------------- Patient/Caregiver Education Details Patient Name: Date of Service: Margurite Auerbach 6/1/2023andnbsp12:45 PM Medical Record Number: [id number] Patient Account Number: [id number] Date of Birth/Gender: Treating RN: 03-26-1929 (88 y.o. Mare Ferrari Primary Care Physician: Jenna Luo Other Clinician: Referring Physician: Treating Physician/Extender: Mertha Finders in Treatment: 3 Education Assessment Education Provided To: Patient Education Topics Provided Wound/Skin Impairment: Methods: Explain/Verbal Responses: State content correctly Electronic Signature(s) Signed: 02/15/2022 5:59:26 PM By: Sharyn Creamer RN, BSN Entered By: Sharyn Creamer on 02/15/2022 13:26:30 -------------------------------------------------------------------------------- Wound Assessment Details Patient Name: Date of Service: John Lopes D. 02/15/2022 12:45 PM Medical Record Number: [id number] Patient Account Number: [id number] Date of Birth/Sex: Treating RN: 1928/11/14 (88 y.o. Mare Ferrari Primary Care Abrahim Sargent: Jenna Luo Other Clinician: Referring Zuly Belkin: Treating Zoey Bidwell/Extender: Mertha Finders in Treatment: 3 Wound Status Wound Number: 1 Primary Etiology: Pressure Ulcer Wound Location: Left, Lateral Malleolus Wound Status: Open Wounding Event: Gradually Appeared Comorbid Chronic Obstructive Pulmonary Disease (COPD), History: Osteoarthritis Date Acquired: 01/16/2020 Weeks Of Treatment: 3 Clustered Wound: No Photos Wound Measurements Length: (cm) 0.3 Width: (cm) 0.4 Depth: (cm) 0.1 Area: (cm) 0.094 Volume: (cm) 0.009 % Reduction in Area: 20.3% % Reduction in Volume: 25% Epithelialization: Small (1-33%) Undermining: No Wound Description Classification: Category/Stage III Wound Margin: Flat and Intact Exudate Amount:  Medium Exudate Type: Serous Exudate Color: amber Foul Odor After Cleansing: No Slough/Fibrino Yes Wound Bed  Granulation Amount: Large (67-100%) Exposed Structure Granulation Quality: Pink, Pale Fascia Exposed: No Necrotic Amount: Small (1-33%) Fat Layer (Subcutaneous Tissue) Exposed: Yes Necrotic Quality: Adherent Slough Tendon Exposed: No Muscle Exposed: No Joint Exposed: No Bone Exposed: No Electronic Signature(s) Signed: 02/15/2022 5:16:25 PM By: Adline Peals Signed: 02/15/2022 5:59:26 PM By: Sharyn Creamer RN, BSN Entered By: Adline Peals on 02/15/2022 13:16:45 -------------------------------------------------------------------------------- [place] Details Patient Name: Date of Service: John Lopes D. 02/15/2022 12:45 PM Medical Record Number: [id number] Patient Account Number: [id number] Date of Birth/Sex: Treating RN: 16-Feb-1929 (88 y.o. Mare Ferrari Primary Care Janissa Bertram: Jenna Luo Other Clinician: Referring Romanita Fager: Treating Keiasia Christianson/Extender: Mertha Finders in Treatment: 3 Vital Signs Time Taken: 13:11 Temperature (F): 98.2 Height (in): 64 Pulse (bpm): 88 Weight (lbs): 118 Respiratory Rate (breaths/min): 18 Body Mass Index (BMI): 20.3 Blood Pressure (mmHg): 118/74 Reference Range: 80 - 120 mg / dl Electronic Signature(s) Signed: 02/15/2022 5:59:26 PM By: Sharyn Creamer RN, BSN Entered By: Sharyn Creamer on 02/15/2022 13:15:44

## 2022-02-20 DIAGNOSIS — H903 Sensorineural hearing loss, bilateral: Secondary | ICD-10-CM | POA: Diagnosis not present

## 2022-02-20 DIAGNOSIS — H6123 Impacted cerumen, bilateral: Secondary | ICD-10-CM | POA: Diagnosis not present

## 2022-02-22 ENCOUNTER — Encounter (HOSPITAL_BASED_OUTPATIENT_CLINIC_OR_DEPARTMENT_OTHER): Payer: PPO | Admitting: General Surgery

## 2022-02-22 DIAGNOSIS — L821 Other seborrheic keratosis: Secondary | ICD-10-CM | POA: Diagnosis not present

## 2022-02-22 DIAGNOSIS — X32XXXD Exposure to sunlight, subsequent encounter: Secondary | ICD-10-CM | POA: Diagnosis not present

## 2022-02-22 DIAGNOSIS — R2681 Unsteadiness on feet: Secondary | ICD-10-CM | POA: Diagnosis not present

## 2022-02-22 DIAGNOSIS — J439 Emphysema, unspecified: Secondary | ICD-10-CM | POA: Diagnosis not present

## 2022-02-22 DIAGNOSIS — J9611 Chronic respiratory failure with hypoxia: Secondary | ICD-10-CM | POA: Diagnosis not present

## 2022-02-22 DIAGNOSIS — L570 Actinic keratosis: Secondary | ICD-10-CM | POA: Diagnosis not present

## 2022-02-22 DIAGNOSIS — Z85828 Personal history of other malignant neoplasm of skin: Secondary | ICD-10-CM | POA: Diagnosis not present

## 2022-02-22 DIAGNOSIS — Z08 Encounter for follow-up examination after completed treatment for malignant neoplasm: Secondary | ICD-10-CM | POA: Diagnosis not present

## 2022-02-22 DIAGNOSIS — B360 Pityriasis versicolor: Secondary | ICD-10-CM | POA: Diagnosis not present

## 2022-02-22 DIAGNOSIS — L905 Scar conditions and fibrosis of skin: Secondary | ICD-10-CM | POA: Diagnosis not present

## 2022-03-01 DIAGNOSIS — J449 Chronic obstructive pulmonary disease, unspecified: Secondary | ICD-10-CM | POA: Diagnosis not present

## 2022-03-01 DIAGNOSIS — Z515 Encounter for palliative care: Secondary | ICD-10-CM | POA: Diagnosis not present

## 2022-03-01 DIAGNOSIS — J9611 Chronic respiratory failure with hypoxia: Secondary | ICD-10-CM | POA: Diagnosis not present

## 2022-03-05 ENCOUNTER — Other Ambulatory Visit: Payer: Self-pay

## 2022-03-05 DIAGNOSIS — J441 Chronic obstructive pulmonary disease with (acute) exacerbation: Secondary | ICD-10-CM

## 2022-03-05 MED ORDER — FUROSEMIDE 40 MG PO TABS: 40.0000 mg | ORAL_TABLET | Freq: Every day | ORAL | 3 refills | Status: AC

## 2022-03-05 NOTE — Telephone Encounter (Signed)
Rx sent to pharmacy   

## 2022-03-08 ENCOUNTER — Ambulatory Visit (HOSPITAL_BASED_OUTPATIENT_CLINIC_OR_DEPARTMENT_OTHER): Payer: PPO | Admitting: General Surgery

## 2022-03-14 ENCOUNTER — Telehealth: Payer: Self-pay | Admitting: Internal Medicine

## 2022-03-14 NOTE — Telephone Encounter (Signed)
pt daughter is calling stating that the pt is in hospice and states that they're wanting to change his medications and the daughter states that the pt must stay on trelogy but in order for hospice to do so then a phone call must be made by dr wert stating that specifically  Hospice :[facility]

## 2022-03-15 ENCOUNTER — Ambulatory Visit (HOSPITAL_BASED_OUTPATIENT_CLINIC_OR_DEPARTMENT_OTHER): Payer: PPO | Admitting: General Surgery

## 2022-03-15 NOTE — Telephone Encounter (Signed)
Called patient's daughter but she did not answer. Left message for her to call us back.  

## 2022-03-16 ENCOUNTER — Ambulatory Visit (HOSPITAL_BASED_OUTPATIENT_CLINIC_OR_DEPARTMENT_OTHER): Payer: PPO | Admitting: General Surgery

## 2022-03-19 NOTE — Telephone Encounter (Signed)
Yes, rec trelegy daily for symptom control

## 2022-03-19 NOTE — Telephone Encounter (Signed)
Called and spoke with [name] who states that pt has been switched to hospice in home care. States that pt is currently doing good but is requiring more care than before.   John Black said that hospice is wanting to take over pt's medications and said that Trelegy is not a hospice approved medication but said if Dr. Melvyn Novas felt like pt needed to stay on Trelegy that they would make the exception.  Dr. Melvyn Novas, please advise if you feel like pt should stay on Trelegy with it not being an approved hospice medication or if you think that pt would be okay to go on a medication that is an approved hospice medication. John Black stated that they are trying to keep pt comfortable but also stated that the Trelegy is one inhaler that has worked well for pt.

## 2022-03-19 NOTE — Telephone Encounter (Signed)
Called and spoke with John Black. She is aware that Dr. Melvyn Novas would like for the patient to remain on the Trelegy. I asked her if we needed to call anyone from Hospice or sent a letter to anyone. She was unsure of this but provided me with Hospice's number [phone].   [place] but was not able to speak with anyone. Will try again later.

## 2022-03-21 NOTE — Telephone Encounter (Signed)
Called and spoke with [facility])- advised MD would like for the pt to remain on trelegy. Nothing further needed.

## 2022-03-27 ENCOUNTER — Telehealth: Payer: Self-pay | Admitting: Internal Medicine

## 2022-03-27 ENCOUNTER — Other Ambulatory Visit: Payer: Self-pay | Admitting: Family Medicine

## 2022-03-27 ENCOUNTER — Telehealth: Payer: Self-pay

## 2022-03-27 MED ORDER — CEPHALEXIN 500 MG PO CAPS: 500.0000 mg | ORAL_CAPSULE | Freq: Three times a day (TID) | ORAL | 0 refills | Status: DC

## 2022-03-27 NOTE — Telephone Encounter (Signed)
Karen-nurse w/[facility]  Per nurse, Pt has a wound look reds, more tender today. Nurse believe it might be infected. Please send antibiotics Rx to [facility].

## 2022-03-27 NOTE — Telephone Encounter (Signed)
Santiago Glad from [facility] called in about pt having a wound on his ankle. Santiago Glad states that family has been treating the wound, but is afraid that the wound may have become infected. Please advise.  Cb#: [phone]

## 2022-03-28 ENCOUNTER — Other Ambulatory Visit: Payer: Self-pay

## 2022-03-28 MED ORDER — PREDNISONE 10 MG PO TABS: 10.0000 mg | ORAL_TABLET | Freq: Every day | ORAL | 3 refills | Status: AC

## 2022-03-28 NOTE — Telephone Encounter (Signed)
Spoke with pt's wife, aware of antibiotic Rx to pharmacy. Nothing at this time.

## 2022-03-28 NOTE — Telephone Encounter (Signed)
Attempted to call pt, unsuccessful. LM for pt to return call and will try again later.

## 2022-03-28 NOTE — Telephone Encounter (Signed)
ATC Dr Ronne Binning for another patient and have been unable to sort through answering service to speak to the Doctor. Will try again

## 2022-03-28 NOTE — Progress Notes (Signed)
Spoke with pt's wife, aware of antibiotic Rx to pharmacy. Nothing at this time.

## 2022-03-29 DIAGNOSIS — B351 Tinea unguium: Secondary | ICD-10-CM | POA: Diagnosis not present

## 2022-03-29 DIAGNOSIS — M79675 Pain in left toe(s): Secondary | ICD-10-CM | POA: Diagnosis not present

## 2022-03-29 DIAGNOSIS — M79674 Pain in right toe(s): Secondary | ICD-10-CM | POA: Diagnosis not present

## 2022-03-29 NOTE — Telephone Encounter (Signed)
Duoneb qid and prednisone 10 mg daily until breathing better then 5 mg daily

## 2022-03-29 NOTE — Telephone Encounter (Signed)
I called and spoke with Dr Ronne Binning  This hospice pt is on [place] does not cover this  He is asking if they can change this to Duoneb and oral steroid like pred or decadron  Please advise, thanks!   Allergies  Allergen Reactions   Morphine Other (See Comments)    REACTION: sweats

## 2022-03-30 MED ORDER — PREDNISONE 10 MG PO TABS: 10.0000 mg | ORAL_TABLET | Freq: Every day | ORAL | 0 refills | Status: AC

## 2022-03-30 MED ORDER — IPRATROPIUM-ALBUTEROL 0.5-2.5 (3) MG/3ML IN SOLN: 3.0000 mL | RESPIRATORY_TRACT | 2 refills | Status: AC | PRN

## 2022-04-06 ENCOUNTER — Telehealth: Payer: Self-pay | Admitting: Internal Medicine

## 2022-04-06 NOTE — Telephone Encounter (Signed)
Called and spoke to John Black and told her what Dr. Melvyn Novas had stated in regards to her message she asked if pt could just do symbicort instead of doing the nebulizer at all. I asked Dr. Melvyn Novas and he verbally confirmed that patient was ok to use symbicort if trelegy did not get approved. Leveda Anna will follow up with [place] if trelegy does not get approved. Nothing further needed at this time.

## 2022-04-06 NOTE — Telephone Encounter (Signed)
Leveda Anna called and stated that Hospice is denying the Trelegy(too expensive) instead they are wanting to do the 3 meds that make up Trelegy. [name]'s understanding is that this would require 2 different nebulizer meds 4x's a day which would mean patient has to use a neb. 8 times a day and one steroid. She states she wants to know if these medications can be combined so he would only need to use nebulizer 4 times a day. She is unsure what the names are of the nebulizer medications.    Offered to give patient two trelegy samples (will last for a month) and [employer] patient assistance paperwork for the trelegy inhaler for patient to fill out and bring back to office to see if he could get approved for the free supply of Trelegy.   If he does not get approved, patients daughter wants to know if patient could possibly go back to symbicort inhaler if hospice would cover?   Hospice has stated the only other option is to take him off Hospice and back under Dr. Gustavus Bryant care.   Dr. Melvyn Novas please advise

## 2022-04-06 NOTE — Telephone Encounter (Signed)
John Black called and stated that Hospice is denying the Trelegy(too expensive) instead they are wanting to do the 3 meds that make up Trelegy. [name]'s understanding is that this would require 2 different nebulizer meds 4x's a day. Her 1st question is can they be combined so he is not on a nebulizer all the time. 2nd question is can they get a sample to buy them some more time to figure this out. Hospice has stated the only other option is to take him off Hospice and back under Dr. Gustavus Bryant care.

## 2022-04-06 NOTE — Telephone Encounter (Signed)
Duoneb qid is fine since the other ingredient is basically prednisone which he gets orally anyway per his [facility]

## 2022-04-10 ENCOUNTER — Other Ambulatory Visit: Payer: Self-pay | Admitting: Family Medicine

## 2022-04-13 ENCOUNTER — Telehealth: Payer: Self-pay | Admitting: Internal Medicine

## 2022-04-13 MED ORDER — TRELEGY ELLIPTA 100-62.5-25 MCG/ACT IN AEPB: 1.0000 | INHALATION_SPRAY | Freq: Every day | RESPIRATORY_TRACT | 11 refills | Status: AC

## 2022-04-13 NOTE — Telephone Encounter (Signed)
Paper prescription printed and placed in Dr. Morrison Old sign folder.

## 2022-04-16 ENCOUNTER — Telehealth: Payer: Self-pay

## 2022-04-16 NOTE — Telephone Encounter (Signed)
Brithany-nurse w/[facility] [phone]  Nurse called and stated that pt's urine is brown in color. Plus pt's wife had shown pt's diaper and there were Blood on it.   Per nurse pt's wife would like to see if it would be ok for pt to take a Tumeric as well ?  What do you want to do, please advice?

## 2022-04-16 NOTE — Telephone Encounter (Signed)
[place] papers faxed to [place]. Nothing further needed at this time. Will leave papers in [facility] office in pt assistance folder until we hear back from [place] on approval/denial

## 2022-04-17 NOTE — Telephone Encounter (Signed)
Please call pt for appt per Dr. Dennard Schaumann

## 2022-04-20 ENCOUNTER — Other Ambulatory Visit: Payer: PPO

## 2022-04-20 DIAGNOSIS — R319 Hematuria, unspecified: Secondary | ICD-10-CM | POA: Diagnosis not present

## 2022-04-20 LAB — URINALYSIS, ROUTINE W REFLEX MICROSCOPIC
Bacteria, UA: NONE SEEN /HPF
Bilirubin Urine: NEGATIVE
Glucose, UA: NEGATIVE
Hyaline Cast: NONE SEEN /LPF
Ketones, ur: NEGATIVE
Leukocytes,Ua: NEGATIVE
Nitrite: NEGATIVE
Specific Gravity, Urine: 1.02 (ref 1.001–1.035)
Squamous Epithelial / HPF: NONE SEEN /HPF (ref <=5)
WBC, UA: NONE SEEN /HPF (ref 0–5)
pH: 7 (ref 5.0–8.0)

## 2022-04-20 LAB — MICROSCOPIC MESSAGE

## 2022-04-20 NOTE — Telephone Encounter (Signed)
Call spoke w/pt's wife, John Black. Wife stated that today pt's urine 'Looks like tea-darker' plus wife stated that,yesterday, one of the sitter stated that she noticed pt pass a blood clot onto his depend.  As for bringing pt in, wife said it would be very hard for her to bring pt in for an appt due to his COPD/and w/pt needing O2. Wife asked if it would be ok for her to get a urine sample from pt to bring it in for testing. Also, advice wife to 'stop' pt's Aspirin as well.   Per  Dr. Dennard Schaumann, pt can bring sample to be tested so he can ruled out any infections that may causing bleeding in his urine.   Pt voiced understanding and will bring in sample later today.

## 2022-04-23 ENCOUNTER — Telehealth: Payer: Self-pay | Admitting: Internal Medicine

## 2022-04-23 NOTE — Telephone Encounter (Signed)
Urine sample was brought in on 04/20/22

## 2022-04-23 NOTE — Telephone Encounter (Signed)
Spoke with Dr Ronne Binning and notified of response per MW  Nothing further needed

## 2022-04-23 NOTE — Telephone Encounter (Signed)
Dr. Bradley Ferris (hospice MD) would like to know if hospice can switch pt's Trelegy out with Duonebs and a oral steroid r/t cost of Trelegy for hospice. Dr. Melvyn Novas please advise.

## 2022-04-23 NOTE — Telephone Encounter (Signed)
Fine with me

## 2022-04-24 ENCOUNTER — Telehealth: Payer: Self-pay

## 2022-04-24 ENCOUNTER — Other Ambulatory Visit: Payer: Self-pay | Admitting: Family Medicine

## 2022-04-24 MED ORDER — CEPHALEXIN 500 MG PO CAPS: 500.0000 mg | ORAL_CAPSULE | Freq: Three times a day (TID) | ORAL | 0 refills | Status: AC

## 2022-04-24 NOTE — Telephone Encounter (Signed)
Santiago Glad w/[facility] [phone]  Pt has a wound on his left ankle which pt was going to the wound care but now family has been caring for it.   Pt has yellow pus/exudate coming out of it (very small wound/almost close) family would like antibiotics for it.   Pls call to into [place]

## 2022-04-25 NOTE — Telephone Encounter (Addendum)
Call w/ Santiago Glad w/[facility] [phone]   Per Dr. Dennard Schaumann, he sent in [place].  Santiago Glad voiced understanding. Nothing at this time.

## 2022-05-08 ENCOUNTER — Telehealth: Payer: Self-pay

## 2022-05-08 NOTE — Telephone Encounter (Signed)
Santiago Glad with [facility] called in wanting to speak with nurse about edema in pt's legs. Santiago Glad wanted to ask if pcp wanted to increase Lasix for swelling. Santiago Glad also states that pt's wife wanted to know if she could give pt Tumeric to help reduce swelling. Santiago Glad would like a cb from clinical staff.  CB#: Karen/[facility] [phone]

## 2022-05-22 ENCOUNTER — Telehealth: Payer: Self-pay

## 2022-05-22 NOTE — Telephone Encounter (Signed)
Called and spoke w/karen w/[facility]  Regarding pt's message earlier, told Santiago Glad per Dr. Dennard Schaumann --If he is asymptomatic, would not treat unless he has fever or dysuria. Family has elected not to work up blood in the urine further given his hospice situation.   Santiago Glad voiced understanding. Will also try to contact pt. Nothing further.

## 2022-05-22 NOTE — Telephone Encounter (Signed)
Santiago Glad w/[facility]  [phone]  FYI: Family is reporting pt has blood in urine again. Pt has not not have any no frequency, burning or fever. Family wanted you to be aware incase you wanted a sample for lab work.   Pls advice   Cb# [phone] or [phone]

## 2022-05-27 ENCOUNTER — Other Ambulatory Visit: Payer: Self-pay | Admitting: Family Medicine

## 2022-06-17 DEATH — deceased

## 2024-04-08 NOTE — Telephone Encounter (Signed)
 error
